# Patient Record
Sex: Male | Born: 1960 | Race: White | Hispanic: No | Marital: Married | Smoking: Former smoker
Health system: Southern US, Community
[De-identification: ages and names within clinical notes are randomized; demographics above are authoritative.]

## PROBLEM LIST (undated history)

## (undated) DIAGNOSIS — E7800 Pure hypercholesterolemia, unspecified: Secondary | ICD-10-CM

## (undated) DIAGNOSIS — G8929 Other chronic pain: Secondary | ICD-10-CM

## (undated) DIAGNOSIS — I77810 Thoracic aortic ectasia: Secondary | ICD-10-CM

## (undated) DIAGNOSIS — M545 Low back pain, unspecified: Secondary | ICD-10-CM

## (undated) DIAGNOSIS — Z8719 Personal history of other diseases of the digestive system: Secondary | ICD-10-CM

## (undated) DIAGNOSIS — F32A Depression, unspecified: Secondary | ICD-10-CM

## (undated) DIAGNOSIS — I2510 Atherosclerotic heart disease of native coronary artery without angina pectoris: Secondary | ICD-10-CM

## (undated) DIAGNOSIS — C329 Malignant neoplasm of larynx, unspecified: Secondary | ICD-10-CM

## (undated) DIAGNOSIS — J189 Pneumonia, unspecified organism: Secondary | ICD-10-CM

## (undated) DIAGNOSIS — I10 Essential (primary) hypertension: Secondary | ICD-10-CM

## (undated) DIAGNOSIS — M1990 Unspecified osteoarthritis, unspecified site: Secondary | ICD-10-CM

## (undated) DIAGNOSIS — R001 Bradycardia, unspecified: Secondary | ICD-10-CM

## (undated) DIAGNOSIS — K9423 Gastrostomy malfunction: Secondary | ICD-10-CM

## (undated) DIAGNOSIS — C3490 Malignant neoplasm of unspecified part of unspecified bronchus or lung: Secondary | ICD-10-CM

## (undated) DIAGNOSIS — K219 Gastro-esophageal reflux disease without esophagitis: Secondary | ICD-10-CM

## (undated) DIAGNOSIS — I6529 Occlusion and stenosis of unspecified carotid artery: Secondary | ICD-10-CM

## (undated) DIAGNOSIS — F419 Anxiety disorder, unspecified: Secondary | ICD-10-CM

## (undated) DIAGNOSIS — Z931 Gastrostomy status: Secondary | ICD-10-CM

## (undated) DIAGNOSIS — D649 Anemia, unspecified: Secondary | ICD-10-CM

## (undated) HISTORY — DX: Thoracic aortic ectasia: I77.810

## (undated) HISTORY — DX: Gastrostomy malfunction: K94.23

## (undated) HISTORY — PX: HEMORRHOID SURGERY: SHX153

## (undated) HISTORY — DX: Gastrostomy status: Z93.1

---

## 2000-05-12 ENCOUNTER — Emergency Department (HOSPITAL_COMMUNITY): Admission: EM | Admit: 2000-05-12 | Discharge: 2000-05-12 | Payer: Self-pay | Admitting: *Deleted

## 2003-04-11 ENCOUNTER — Emergency Department (HOSPITAL_COMMUNITY): Admission: EM | Admit: 2003-04-11 | Discharge: 2003-04-11 | Payer: Self-pay | Admitting: Emergency Medicine

## 2005-10-16 ENCOUNTER — Ambulatory Visit (HOSPITAL_COMMUNITY): Admission: RE | Admit: 2005-10-16 | Discharge: 2005-10-16 | Payer: Self-pay | Admitting: Family Medicine

## 2008-08-18 ENCOUNTER — Ambulatory Visit (HOSPITAL_COMMUNITY): Admission: RE | Admit: 2008-08-18 | Discharge: 2008-08-18 | Payer: Self-pay | Admitting: Family Medicine

## 2009-05-07 ENCOUNTER — Encounter (HOSPITAL_COMMUNITY): Admission: RE | Admit: 2009-05-07 | Discharge: 2009-05-07 | Payer: Self-pay | Admitting: Cardiology

## 2009-07-04 ENCOUNTER — Emergency Department (HOSPITAL_COMMUNITY): Admission: EM | Admit: 2009-07-04 | Discharge: 2009-07-04 | Payer: Self-pay | Admitting: Emergency Medicine

## 2010-01-23 ENCOUNTER — Encounter: Payer: Self-pay | Admitting: Family Medicine

## 2010-02-15 ENCOUNTER — Encounter (HOSPITAL_COMMUNITY): Payer: 59

## 2010-02-15 ENCOUNTER — Other Ambulatory Visit: Payer: Self-pay | Admitting: General Surgery

## 2010-02-15 DIAGNOSIS — Z01812 Encounter for preprocedural laboratory examination: Secondary | ICD-10-CM | POA: Insufficient documentation

## 2010-02-15 DIAGNOSIS — Z01810 Encounter for preprocedural cardiovascular examination: Secondary | ICD-10-CM | POA: Insufficient documentation

## 2010-02-15 LAB — BASIC METABOLIC PANEL
BUN: 19 mg/dL (ref 6–23)
CO2: 27 mEq/L (ref 19–32)
Calcium: 9.5 mg/dL (ref 8.4–10.5)
Chloride: 99 mEq/L (ref 96–112)
Creatinine, Ser: 1.1 mg/dL (ref 0.4–1.5)
GFR calc Af Amer: >60 mL/min (ref >60)
GFR calc non Af Amer: >60 mL/min (ref >60)
Glucose, Bld: 107 mg/dL — ABNORMAL HIGH (ref 70–99)
Potassium: 4.6 mEq/L (ref 3.5–5.1)
Sodium: 134 mEq/L — ABNORMAL LOW (ref 135–145)

## 2010-02-15 LAB — CBC
HCT: 43.1 % (ref 39.0–52.0)
Hemoglobin: 14.6 g/dL (ref 13.0–17.0)
MCH: 31.8 pg (ref 26.0–34.0)
MCHC: 33.9 g/dL (ref 30.0–36.0)
MCV: 93.9 fL (ref 78.0–100.0)
Platelets: 252 10*3/uL (ref 150–400)
RBC: 4.59 MIL/uL (ref 4.22–5.81)
RDW: 12.8 % (ref 11.5–15.5)
WBC: 8.7 10*3/uL (ref 4.0–10.5)

## 2010-02-15 LAB — SURGICAL PCR SCREEN
MRSA, PCR: NEGATIVE
Staphylococcus aureus: NEGATIVE

## 2010-02-16 ENCOUNTER — Other Ambulatory Visit: Payer: Self-pay | Admitting: General Surgery

## 2010-02-16 ENCOUNTER — Ambulatory Visit (HOSPITAL_COMMUNITY)
Admission: RE | Admit: 2010-02-16 | Discharge: 2010-02-16 | Disposition: A | Discharge status: Home / Self Care | Payer: 59 | Source: Ambulatory Visit | Attending: General Surgery | Admitting: General Surgery

## 2010-02-16 DIAGNOSIS — I10 Essential (primary) hypertension: Secondary | ICD-10-CM | POA: Insufficient documentation

## 2010-02-16 DIAGNOSIS — K645 Perianal venous thrombosis: Secondary | ICD-10-CM | POA: Insufficient documentation

## 2010-02-16 DIAGNOSIS — Z79899 Other long term (current) drug therapy: Secondary | ICD-10-CM | POA: Insufficient documentation

## 2010-02-16 DIAGNOSIS — Z01812 Encounter for preprocedural laboratory examination: Secondary | ICD-10-CM | POA: Insufficient documentation

## 2010-02-17 NOTE — Op Note (Signed)
  [name], [name]                 ACCOUNT NO.:  [id number]  MEDICAL RECORD NO.:  [id number]           PATIENT TYPE:  O  LOCATION:  DAYP                          FACILITY:  [facility]  PHYSICIAN:  Dalia Heading, M.D.  DATE OF BIRTH:  20-Mar-1960  DATE OF PROCEDURE:  02/16/2010 DATE OF DISCHARGE:                              OPERATIVE REPORT   Age, 50 years old.  PREOPERATIVE DIAGNOSIS:  Thrombosed hemorrhoid.  POSTOPERATIVE DIAGNOSIS:  Thrombosed hemorrhoid.  PROCEDURE:  Hemorrhoidectomy.  SURGEON:  Dalia Heading, MD  ANESTHESIA:  General.  INDICATIONS:  The patient is a 50 year old white male who presents with a thrombosed internal and external hemorrhoid at the 7 o'clock position. The risks and benefits of the procedure were fully explained to the patient, gave informed consent.  PROCEDURE NOTE:  The patient was placed in lithotomy position after general anesthesia was administered.  The perineum was prepped and draped using the usual sterile technique with Betadine.  Surgical site confirmation was performed.  On anoscopy, a thrombosed internal and external hemorrhoid was noted at the 7 o'clock position.  No other significant hemorrhoidal disease was noted.  Sphincter tone was fine.  The thrombosed hemorrhoid was excised using the LigaSure.  Was sent to the pathology further examination.  A small area of the incision line was reinforced using a 2-0 Vicryl interrupted sutures.  A 0.5 Sensorcaine was instilled into the surrounding wound.  Surgicel and viscous Xylocaine packing was then placed.  All tape and needle counts were correct at the end of the procedure. The patient was awakened and transferred to PACU in stable condition.  COMPLICATIONS:  None.  SPECIMEN:  Thrombosed hemorrhoid.  BLOOD LOSS:  Minimal.     Dalia Heading, M.D.     MAJ/MEDQ  D:  02/16/2010  T:  02/16/2010  Job:  [id number]  cc:   Kirk Ruths, M.D. Fax: [phone]  Electronically  Signed by Franky Macho M.D. on 02/17/2010 12:38:10 PM

## 2010-02-17 NOTE — H&P (Signed)
  [name], [name]                 ACCOUNT NO.:  [id number]  MEDICAL RECORD NO.:  [id number]           PATIENT TYPE:  O  LOCATION:  DAY                           FACILITY:  [facility]  PHYSICIAN:  Dalia Heading, M.D.  DATE OF BIRTH:  [birth year]  DATE OF ADMISSION:  02/15/2010 DATE OF DISCHARGE:  LH                             HISTORY & PHYSICAL   CHIEF COMPLAINT:  Thrombosed hemorrhoid.  HISTORY OF PRESENT ILLNESS:  The patient is a 50 year old white male who is referred for evaluation and treatment of rectal pain.  He started having hemorrhoid pain last week.  He was started on hemorrhoid cream which has not been helpful.  PAST MEDICAL HISTORY:  Hypertension, reflux disease.  PAST SURGICAL HISTORY:  Unremarkable.  CURRENT MEDICATIONS:  Metoprolol, lisinopril, hydrochlorothiazide, ranitidine, simvastatin.  ALLERGIES:  No known drug allergies.  REVIEW OF SYSTEMS:  The patient drinks a 6-pack of alcohol frequently. He does smoke tobacco.  He denies any other cardiopulmonary difficulties or bleeding disorders.  FAMILY MEDICAL HISTORY:  Noncontributory.  PHYSICAL EXAMINATION:  GENERAL:  The patient is a well-developed, well- nourished white male, in no acute distress. HEENT:  No scleral icterus. LUNGS:  Clear to auscultation with equal breath sounds bilaterally. HEART:  Regular rate and rhythm without S3, S4, or murmurs. ABDOMEN:  Unremarkable. RECTAL:  Thrombosed internal hemorrhoid noted along the right lateral aspect of the anus.  Examination is limited secondary to pain.  IMPRESSION:  Thrombosed hemorrhoid.  PLAN:  The patient is scheduled for a hemorrhoidectomy on February 16, 2010.  The risks and benefits of the procedure including bleeding, infection, and recurrence of the hemorrhoidal disease were fully explained to the patient, gave informed consent.     Dalia Heading, M.D.     MAJ/MEDQ  D:  02/15/2010  T:  02/16/2010  Job:  [id number]  cc:   Kirk Ruths, M.D. Fax: [phone]  Electronically Signed by Franky Macho M.D. on 02/17/2010 12:38:07 PM

## 2010-06-23 ENCOUNTER — Other Ambulatory Visit (HOSPITAL_COMMUNITY): Payer: Self-pay | Admitting: Internal Medicine

## 2010-06-23 DIAGNOSIS — M545 Low back pain, unspecified: Secondary | ICD-10-CM

## 2010-06-27 ENCOUNTER — Ambulatory Visit (HOSPITAL_COMMUNITY)
Admission: RE | Admit: 2010-06-27 | Discharge: 2010-06-27 | Disposition: A | Discharge status: Home / Self Care | Payer: 59 | Source: Ambulatory Visit | Attending: Internal Medicine | Admitting: Internal Medicine

## 2010-06-27 DIAGNOSIS — M545 Low back pain, unspecified: Secondary | ICD-10-CM

## 2010-06-27 DIAGNOSIS — M79609 Pain in unspecified limb: Secondary | ICD-10-CM | POA: Insufficient documentation

## 2010-06-27 DIAGNOSIS — M5137 Other intervertebral disc degeneration, lumbosacral region: Secondary | ICD-10-CM | POA: Insufficient documentation

## 2010-06-27 DIAGNOSIS — M5126 Other intervertebral disc displacement, lumbar region: Secondary | ICD-10-CM | POA: Insufficient documentation

## 2010-06-27 DIAGNOSIS — M51379 Other intervertebral disc degeneration, lumbosacral region without mention of lumbar back pain or lower extremity pain: Secondary | ICD-10-CM | POA: Insufficient documentation

## 2010-07-26 ENCOUNTER — Ambulatory Visit (HOSPITAL_COMMUNITY)
Admission: RE | Admit: 2010-07-26 | Discharge: 2010-07-26 | Disposition: A | Discharge status: Home / Self Care | Payer: 59 | Source: Ambulatory Visit | Attending: Family Medicine | Admitting: Family Medicine

## 2010-07-26 ENCOUNTER — Other Ambulatory Visit (HOSPITAL_COMMUNITY): Payer: Self-pay | Admitting: Family Medicine

## 2010-07-26 DIAGNOSIS — S8990XA Unspecified injury of unspecified lower leg, initial encounter: Secondary | ICD-10-CM | POA: Insufficient documentation

## 2010-07-26 DIAGNOSIS — T148XXA Other injury of unspecified body region, initial encounter: Secondary | ICD-10-CM

## 2010-07-26 DIAGNOSIS — M25579 Pain in unspecified ankle and joints of unspecified foot: Secondary | ICD-10-CM | POA: Insufficient documentation

## 2010-07-26 DIAGNOSIS — M7730 Calcaneal spur, unspecified foot: Secondary | ICD-10-CM | POA: Insufficient documentation

## 2010-07-26 DIAGNOSIS — S99919A Unspecified injury of unspecified ankle, initial encounter: Secondary | ICD-10-CM | POA: Insufficient documentation

## 2010-07-26 DIAGNOSIS — S99929A Unspecified injury of unspecified foot, initial encounter: Secondary | ICD-10-CM | POA: Insufficient documentation

## 2010-07-26 DIAGNOSIS — X58XXXA Exposure to other specified factors, initial encounter: Secondary | ICD-10-CM | POA: Insufficient documentation

## 2010-09-01 ENCOUNTER — Other Ambulatory Visit (HOSPITAL_COMMUNITY): Payer: Self-pay | Admitting: Family Medicine

## 2010-09-01 DIAGNOSIS — M7750 Other enthesopathy of unspecified foot: Secondary | ICD-10-CM

## 2010-09-01 DIAGNOSIS — M25579 Pain in unspecified ankle and joints of unspecified foot: Secondary | ICD-10-CM

## 2010-09-06 ENCOUNTER — Ambulatory Visit (HOSPITAL_COMMUNITY): Admission: RE | Admit: 2010-09-06 | Payer: 59 | Source: Ambulatory Visit

## 2012-09-03 ENCOUNTER — Encounter (HOSPITAL_COMMUNITY): Payer: Self-pay | Admitting: *Deleted

## 2012-09-03 ENCOUNTER — Emergency Department (HOSPITAL_COMMUNITY): Payer: BC Managed Care – PPO

## 2012-09-03 ENCOUNTER — Observation Stay (HOSPITAL_COMMUNITY)
Admission: EM | Admit: 2012-09-03 | Discharge: 2012-09-05 | Disposition: A | Discharge status: Home / Self Care | Payer: BC Managed Care – PPO | Attending: Family Medicine | Admitting: Family Medicine

## 2012-09-03 DIAGNOSIS — Z79899 Other long term (current) drug therapy: Secondary | ICD-10-CM | POA: Insufficient documentation

## 2012-09-03 DIAGNOSIS — M545 Low back pain, unspecified: Secondary | ICD-10-CM | POA: Insufficient documentation

## 2012-09-03 DIAGNOSIS — Z8249 Family history of ischemic heart disease and other diseases of the circulatory system: Secondary | ICD-10-CM | POA: Insufficient documentation

## 2012-09-03 DIAGNOSIS — E785 Hyperlipidemia, unspecified: Secondary | ICD-10-CM

## 2012-09-03 DIAGNOSIS — E7800 Pure hypercholesterolemia, unspecified: Secondary | ICD-10-CM | POA: Insufficient documentation

## 2012-09-03 DIAGNOSIS — E871 Hypo-osmolality and hyponatremia: Secondary | ICD-10-CM

## 2012-09-03 DIAGNOSIS — F1010 Alcohol abuse, uncomplicated: Secondary | ICD-10-CM

## 2012-09-03 DIAGNOSIS — R110 Nausea: Secondary | ICD-10-CM | POA: Insufficient documentation

## 2012-09-03 DIAGNOSIS — F411 Generalized anxiety disorder: Secondary | ICD-10-CM | POA: Insufficient documentation

## 2012-09-03 DIAGNOSIS — R079 Chest pain, unspecified: Principal | ICD-10-CM

## 2012-09-03 DIAGNOSIS — Z7982 Long term (current) use of aspirin: Secondary | ICD-10-CM | POA: Insufficient documentation

## 2012-09-03 DIAGNOSIS — R209 Unspecified disturbances of skin sensation: Secondary | ICD-10-CM | POA: Insufficient documentation

## 2012-09-03 DIAGNOSIS — G8929 Other chronic pain: Secondary | ICD-10-CM | POA: Insufficient documentation

## 2012-09-03 DIAGNOSIS — E860 Dehydration: Secondary | ICD-10-CM

## 2012-09-03 DIAGNOSIS — I10 Essential (primary) hypertension: Secondary | ICD-10-CM | POA: Diagnosis present

## 2012-09-03 DIAGNOSIS — R0602 Shortness of breath: Secondary | ICD-10-CM | POA: Insufficient documentation

## 2012-09-03 HISTORY — DX: Essential (primary) hypertension: I10

## 2012-09-03 HISTORY — DX: Anxiety disorder, unspecified: F41.9

## 2012-09-03 HISTORY — DX: Pure hypercholesterolemia, unspecified: E78.00

## 2012-09-03 LAB — CBC
HCT: 38.9 % — ABNORMAL LOW (ref 39.0–52.0)
Hemoglobin: 13.8 g/dL (ref 13.0–17.0)
MCH: 32.9 pg (ref 26.0–34.0)
MCHC: 35.5 g/dL (ref 30.0–36.0)
MCV: 92.6 fL (ref 78.0–100.0)
Platelets: 233 10*3/uL (ref 150–400)
RBC: 4.2 MIL/uL — ABNORMAL LOW (ref 4.22–5.81)
RDW: 13.2 % (ref 11.5–15.5)
WBC: 6.6 10*3/uL (ref 4.0–10.5)

## 2012-09-03 LAB — BASIC METABOLIC PANEL
BUN: 9 mg/dL (ref 6–23)
CO2: 20 mEq/L (ref 19–32)
Calcium: 9.9 mg/dL (ref 8.4–10.5)
Chloride: 92 mEq/L — ABNORMAL LOW (ref 96–112)
Creatinine, Ser: 0.99 mg/dL (ref 0.50–1.35)
GFR calc Af Amer: >90 mL/min (ref >90)
GFR calc non Af Amer: >90 mL/min (ref >90)
Glucose, Bld: 84 mg/dL (ref 70–99)
Potassium: 3.8 mEq/L (ref 3.5–5.1)
Sodium: 129 mEq/L — ABNORMAL LOW (ref 135–145)

## 2012-09-03 LAB — TROPONIN I
Troponin I: <0.3 ng/mL
Troponin I: <0.3 ng/mL (ref <0.30)

## 2012-09-03 MED ORDER — FOLIC ACID 1 MG PO TABS
1.0000 mg | ORAL_TABLET | Freq: Every day | ORAL | Status: DC
  Administered 2012-09-03 – 2012-09-05 (×3): 1 mg via ORAL
  Filled 2012-09-03 (×3): qty 1

## 2012-09-03 MED ORDER — SODIUM CHLORIDE 0.9 % IV SOLN
INTRAVENOUS | Status: AC
  Administered 2012-09-03: 18:00:00 via INTRAVENOUS

## 2012-09-03 MED ORDER — NITROGLYCERIN 0.4 MG SL SUBL
0.4000 mg | SUBLINGUAL_TABLET | SUBLINGUAL | Status: DC | PRN
  Administered 2012-09-03 (×2): 0.4 mg via SUBLINGUAL
  Filled 2012-09-03: qty 25

## 2012-09-03 MED ORDER — SODIUM CHLORIDE 0.9 % IV BOLUS (SEPSIS): 500.0000 mL | Freq: Once | INTRAVENOUS | Status: DC

## 2012-09-03 MED ORDER — ONDANSETRON HCL 4 MG PO TABS: 4.0000 mg | ORAL_TABLET | Freq: Four times a day (QID) | ORAL | Status: DC | PRN

## 2012-09-03 MED ORDER — LISINOPRIL 10 MG PO TABS
20.0000 mg | ORAL_TABLET | Freq: Every day | ORAL | Status: DC
  Administered 2012-09-04: 20 mg via ORAL
  Filled 2012-09-03: qty 2

## 2012-09-03 MED ORDER — ASPIRIN EC 81 MG PO TBEC
81.0000 mg | DELAYED_RELEASE_TABLET | Freq: Every day | ORAL | Status: DC
  Administered 2012-09-04 – 2012-09-05 (×2): 81 mg via ORAL
  Filled 2012-09-03 (×3): qty 1

## 2012-09-03 MED ORDER — SODIUM CHLORIDE 0.9 % IJ SOLN: 3.0000 mL | Freq: Two times a day (BID) | INTRAMUSCULAR | Status: DC

## 2012-09-03 MED ORDER — VITAMIN B-1 100 MG PO TABS
100.0000 mg | ORAL_TABLET | Freq: Every day | ORAL | Status: DC
  Administered 2012-09-03 – 2012-09-05 (×3): 100 mg via ORAL
  Filled 2012-09-03 (×3): qty 1

## 2012-09-03 MED ORDER — ONDANSETRON HCL 4 MG/2ML IJ SOLN: 4.0000 mg | Freq: Four times a day (QID) | INTRAMUSCULAR | Status: DC | PRN

## 2012-09-03 MED ORDER — ADULT MULTIVITAMIN W/MINERALS CH
1.0000 | ORAL_TABLET | Freq: Every day | ORAL | Status: DC
  Administered 2012-09-03 – 2012-09-05 (×3): 1 via ORAL
  Filled 2012-09-03 (×3): qty 1

## 2012-09-03 MED ORDER — SIMVASTATIN 10 MG PO TABS
10.0000 mg | ORAL_TABLET | Freq: Every day | ORAL | Status: DC
  Administered 2012-09-04: 10 mg via ORAL
  Filled 2012-09-03: qty 1

## 2012-09-03 MED ORDER — THIAMINE HCL 100 MG/ML IJ SOLN: 100.0000 mg | Freq: Every day | INTRAMUSCULAR | Status: DC

## 2012-09-03 MED ORDER — ENOXAPARIN SODIUM 40 MG/0.4ML ~~LOC~~ SOLN
40.0000 mg | SUBCUTANEOUS | Status: DC
  Administered 2012-09-03 – 2012-09-04 (×2): 40 mg via SUBCUTANEOUS
  Filled 2012-09-03 (×2): qty 0.4

## 2012-09-03 MED ORDER — LORAZEPAM 1 MG PO TABS
1.0000 mg | ORAL_TABLET | Freq: Four times a day (QID) | ORAL | Status: DC | PRN
  Administered 2012-09-04 (×2): 1 mg via ORAL
  Filled 2012-09-03 (×3): qty 1

## 2012-09-03 MED ORDER — METOPROLOL TARTRATE 50 MG PO TABS
50.0000 mg | ORAL_TABLET | Freq: Two times a day (BID) | ORAL | Status: DC
  Administered 2012-09-03 – 2012-09-05 (×4): 50 mg via ORAL
  Filled 2012-09-03 (×4): qty 1

## 2012-09-03 MED ORDER — NITROGLYCERIN 2 % TD OINT
0.5000 [in_us] | TOPICAL_OINTMENT | Freq: Four times a day (QID) | TRANSDERMAL | Status: DC
  Administered 2012-09-03 – 2012-09-04 (×3): 0.5 [in_us] via TOPICAL
  Filled 2012-09-03 (×4): qty 1

## 2012-09-03 MED ORDER — ACETAMINOPHEN 325 MG PO TABS
ORAL_TABLET | ORAL | Status: AC
  Administered 2012-09-03: 650 mg
  Filled 2012-09-03: qty 2

## 2012-09-03 MED ORDER — MORPHINE SULFATE 2 MG/ML IJ SOLN
1.0000 mg | INTRAMUSCULAR | Status: DC | PRN
  Administered 2012-09-03 – 2012-09-04 (×3): 1 mg via INTRAVENOUS
  Filled 2012-09-03 (×3): qty 1

## 2012-09-03 MED ORDER — ACETAMINOPHEN 325 MG PO TABS
650.0000 mg | ORAL_TABLET | Freq: Four times a day (QID) | ORAL | Status: DC | PRN
  Administered 2012-09-04: 650 mg via ORAL
  Filled 2012-09-03: qty 2

## 2012-09-03 MED ORDER — LORAZEPAM 2 MG/ML IJ SOLN: 1.0000 mg | Freq: Four times a day (QID) | INTRAMUSCULAR | Status: DC | PRN

## 2012-09-03 NOTE — ED Notes (Signed)
Pt reports taking "regular Bayer Aspirin" this morning.

## 2012-09-03 NOTE — ED Provider Notes (Signed)
CSN: [id number]     Arrival date & time 09/03/12  1323 History   This chart was scribed for Jorge Gaskins, MD by Bennett Scrape, ED Scribe. This patient was seen in room [facility]12/[facility]12 and the patient's care was started at 3:25 PM   Chief Complaint  Patient presents with  . Chest Pain    Patient is a 52 y.o. male presenting with chest pain. The history is provided by the patient. No language interpreter was used.  Chest Pain Pain location:  L chest Pain quality: sharp   Pain radiates to:  Does not radiate Pain radiates to the back: no   Duration:  3 hours Timing:  Intermittent Chronicity:  New Associated symptoms: nausea, numbness (in left arm) and shortness of breath   Associated symptoms: no cough, no syncope, not vomiting and no weakness     HPI Comments: [name] is a 52 y.o. male who presents to the Emergency Department complaining of intermittent left-sided CP described as sharp that started around 12:00 PM today. He states that he was at work at the time in the heat when the pain started. He denies having pain with ambulation or deep breathing and denies radiation to his back. He rates his pain a 5 out of 10 currently and reports associated nausea, numbness in the left arm and SOB. He denies taking any medications to improve the pain but admits that he takes 324 mg ASA daily. He reports one prior episode 2 weeks ago that resolved on its own. He states that the symptoms felt like his h/o GERD and he denies being evaluated for the pain at the time. He denies having a h/o DVT, PE, MI or CVA but states that his father has a h/o cardiac disease with stent placement. He denies syncope, cough and leg swelling as associated symptoms.    Past Medical History  Diagnosis Date  . Hypertension   . Anxiety   . Chronic back pain    Past Surgical History  Procedure Laterality Date  . Hemorrhoid surgery     No family history on file. History  Substance Use Topics  . Smoking status:  Never Smoker   . Smokeless tobacco: Not on file  . Alcohol Use: Yes     Comment: daily    Review of Systems  Respiratory: Positive for shortness of breath. Negative for cough.   Cardiovascular: Positive for chest pain. Negative for leg swelling and syncope.  Gastrointestinal: Positive for nausea. Negative for vomiting.  Neurological: Positive for numbness (in left arm). Negative for weakness.  All other systems reviewed and are negative.    Allergies  Review of patient's allergies indicates no known allergies.  Home Medications  No current outpatient prescriptions on file.  Triage Vitals: BP 171/96  Pulse 66  Temp(Src) 97.6 F (36.4 C) (Oral)  Resp 16  Ht 6' (1.829 m)  Wt 195 lb (88.451 kg)  BMI 26.44 kg/m2  SpO2 98%  Physical Exam  Nursing note and vitals reviewed.  CONSTITUTIONAL: Well developed/well nourished HEAD: Normocephalic/atraumatic EYES: EOMI/PERRL ENMT: Mucous membranes moist NECK: supple no meningeal signs SPINE:entire spine nontender CV: S1/S2 noted, no murmurs/rubs/gallops noted LUNGS: scattered wheezes, no apparent distress ABDOMEN: soft, nontender, no rebound or guarding GU:no cva tenderness NEURO: Pt is awake/alert, moves all extremitiesx4 EXTREMITIES: pulses normal, full ROM, no calf tenderness or edema  SKIN: warm, color normal PSYCH: no abnormalities of mood noted  ED Course  Procedures   Medications  nitroGLYCERIN (NITROSTAT) SL  tablet 0.4 mg (0.4 mg Sublingual Given 09/03/12 1548)    DIAGNOSTIC STUDIES: Oxygen Saturation is 98% on room air, normal by my interpretation.    COORDINATION OF CARE: 3:28 PM-Informed pt that his EKG is normal. Discussed treatment plan which includes NTG, CXR, CBC panel, BMP and troponin with pt at bedside and pt agreed to plan.   4:18 PM Will admit for chest pain r/o MI given history/exam D/w [facility], will admit Pt stable at this time, reports his CP is improving  Labs Review Labs Reviewed  CBC -  Abnormal; Notable for the following:    RBC 4.20 (*)    HCT 38.9 (*)    All other components within normal limits  BASIC METABOLIC PANEL - Abnormal; Notable for the following:    Sodium 129 (*)    Chloride 92 (*)    All other components within normal limits  TROPONIN I   Imaging Review Dg Chest 2 View  09/03/2012   *RADIOLOGY REPORT*  Clinical Data: Chest pain  CHEST - 2 VIEW  Comparison:  October 16, 2005  Findings: There is underlying emphysematous change.  There is no edema or consolidation.  The heart size is normal.  The pulmonary vascularity reflects underlying emphysema.  No adenopathy.  There is upper thoracic levoscoliosis.  There is calcification in the right carotid artery.  IMPRESSION: Underlying emphysema.  No edema or consolidation.  Calcification in the right carotid artery.   Original Report Authenticated By: Bretta Bang, M.D.    MDM  No diagnosis found. ,Nursing notes including past medical history and social history reviewed and considered in documentation  xrays reviewed and considered Labs/vital reviewed and considered       Date: 09/03/2012  Rate: 73  Rhythm: normal sinus rhythm  QRS Axis: normal  Intervals: normal  ST/T Wave abnormalities: normal  Conduction Disutrbances:none     I personally performed the services described in this documentation, which was scribed in my presence. The recorded information has been reviewed and is accurate.      Jorge Gaskins, MD 09/03/12 [phone]

## 2012-09-03 NOTE — H&P (Signed)
[facility] History and Physical  [name] ZOX:[id number] DOB: 1960/08/12 DOA: 09/03/2012  Referring physician: Dr. Bebe Shaggy PCP: Kirk Ruths, MD  Specialists: Cardiology  Chief Complaint: Chest pain  HPI: [name] is a 52 y.o. male has a past medical history significant for hypertension, hyperlipidemia, alcohol abuse, presents with chief complaint of chest pain. This chest pain started around noon today while he was at work at the time in the heat. He describes his chest pain as sharp, left-sided, associated with left arm numbness. His chest pain does not irradiate. His chest pain is not worse with a deep breath. His chest pain is associated with nausea, diaphoresis, and mild shortness of breath. He had a similar episode about couple weeks ago and at that time his chest pain was there for the entire day and the eventually subsided. He is a family history of heart attacks, his father had a heart stent placed at the age of 4. At baseline, he is able to work a full day without any chest pain or shortness of breath. In the emergency room, his chest pain has significantly improved with sublingual nitroglycerin. He denies any abdominal pain, vomiting or diarrhea, he denies any lightheadedness or dizziness. He denies any weight gain in the last 2 weeks.   Review of Systems: As per history of present illness, otherwise negative  Past Medical History  Diagnosis Date  . Hypertension   . Anxiety   . Chronic back pain   . Hypercholesteremia    Past Surgical History  Procedure Laterality Date  . Hemorrhoid surgery     Social History:  reports that he has never smoked. He does not have any smokeless tobacco history on file. He reports that  drinks alcohol. He reports that he does not use illicit drugs.  No Known Allergies  History reviewed. No pertinent family history.   Prior to Admission medications   Medication Sig Start Date End Date Taking? Authorizing Provider  aspirin  EC 81 MG tablet Take 81 mg by mouth every morning.   Yes Historical Provider, MD  HYDROcodone-acetaminophen (NORCO) 10-325 MG per tablet Take 1 tablet by mouth every 6 (six) hours as needed for pain.   Yes Historical Provider, MD  lisinopril (PRINIVIL,ZESTRIL) 20 MG tablet Take 20 mg by mouth 2 (two) times daily.   Yes Historical Provider, MD  metoprolol (LOPRESSOR) 50 MG tablet Take 50 mg by mouth 2 (two) times daily.   Yes Historical Provider, MD  pravastatin (PRAVACHOL) 20 MG tablet Take 20 mg by mouth daily.   Yes Historical Provider, MD   Physical Exam: Filed Vitals:   09/03/12 1333 09/03/12 1519  BP: 142/91 171/96  Pulse: 80 66  Temp: 97.6 F (36.4 C)   TempSrc: Oral   Resp: 16 16  Height: 6' (1.829 m)   Weight: 88.451 kg (195 lb)   SpO2: 99% 98%     General:  No apparent distress  Eyes: PERRL, EOMI, no scleral icterus  ENT: moist oropharynx  Neck: supple, no JVD  Cardiovascular: regular rate without MRG; 2+ peripheral pulses  Respiratory: CTA biL, good air movement without wheezing, rhonchi or crackled  Abdomen: soft, non tender to palpation, positive bowel sounds, no guarding, no rebound  Skin: no rashes  Musculoskeletal: no peripheral edema  Psychiatric: normal mood and affect  Neurologic: non focal  Labs on Admission:  Basic Metabolic Panel:  Recent Labs Lab 09/03/12 1400  NA 129*  K 3.8  CL 92*  CO2 20  GLUCOSE 84  BUN 9  CREATININE 0.99  CALCIUM 9.9   CBC:  Recent Labs Lab 09/03/12 1400  WBC 6.6  HGB 13.8  HCT 38.9*  MCV 92.6  PLT 233   Cardiac Enzymes:  Recent Labs Lab 09/03/12 1400  TROPONINI <0.30   Radiological Exams on Admission: Dg Chest 2 View  09/03/2012   *RADIOLOGY REPORT*  Clinical Data: Chest pain  CHEST - 2 VIEW  Comparison:  October 16, 2005  Findings: There is underlying emphysematous change.  There is no edema or consolidation.  The heart size is normal.  The pulmonary vascularity reflects underlying emphysema.   No adenopathy.  There is upper thoracic levoscoliosis.  There is calcification in the right carotid artery.  IMPRESSION: Underlying emphysema.  No edema or consolidation.  Calcification in the right carotid artery.   Original Report Authenticated By: Bretta Bang, M.D.   EKG: Independently reviewed. Sinus rhythm  Assessment/Plan Principal Problem:   Chest pain Active Problems:   Hypertension   Alcohol abuse, daily use   Hyperlipidemia   Hyponatremia  Chest pain - typical characteristics given onset when he was working in the heat and with left arm numbness and relieved by NTG. Initial troponin negative, will check x 3. Have kindly asked cardiology to evaluate, may need a stress test. Has HTN, HLD and strong family history.  HTN - continue home medications Alcohol abuse - drinks a sixpack per day. Place on CIWA. HLD - continue statin.  Hyponatremia - likely hypovolemic, hydration.  Code Status: Full   Family Communication: family in the room  Disposition Plan: observation  Time spent: 86  Herminio Kniskern M. Elvera Lennox, MD [facility] Pager [phone]  If 7PM-7AM, please contact night-coverage [email or web address] Password [facility] 09/03/2012, 4:41 PM

## 2012-09-03 NOTE — Progress Notes (Signed)
Pt has a reaction to salt substitutes.  States blood pressure rises after consumption.

## 2012-09-03 NOTE — ED Notes (Signed)
Sudden onset left sided cp with sob and nausea that started while driving today.  Also reports left hand numbness.  Has been under a lot of stress recently.

## 2012-09-03 NOTE — ED Notes (Signed)
Patient placed on cardiac monitor with continuous pulse oximetry.

## 2012-09-03 NOTE — ED Notes (Signed)
When doing SI screening, pt reports, "Me and my wife have been at it lately and I had thoughts of suicide earlier today, but they went away."  Pt denies plan, denies SI at this time.

## 2012-09-03 NOTE — ED Notes (Signed)
Patient states chest pain decreased from 5 to 4 after first sl nitro.

## 2012-09-04 DIAGNOSIS — R079 Chest pain, unspecified: Secondary | ICD-10-CM

## 2012-09-04 DIAGNOSIS — E785 Hyperlipidemia, unspecified: Secondary | ICD-10-CM

## 2012-09-04 DIAGNOSIS — E871 Hypo-osmolality and hyponatremia: Secondary | ICD-10-CM

## 2012-09-04 DIAGNOSIS — R072 Precordial pain: Secondary | ICD-10-CM

## 2012-09-04 DIAGNOSIS — F1010 Alcohol abuse, uncomplicated: Secondary | ICD-10-CM

## 2012-09-04 DIAGNOSIS — I10 Essential (primary) hypertension: Secondary | ICD-10-CM

## 2012-09-04 LAB — LIPID PANEL
Cholesterol: 166 mg/dL (ref 0–200)
HDL: 71 mg/dL (ref >39)
LDL Cholesterol: 77 mg/dL (ref 0–99)
Total CHOL/HDL Ratio: 2.3 RATIO
Triglycerides: 89 mg/dL (ref <150)
VLDL: 18 mg/dL (ref 0–40)

## 2012-09-04 LAB — BASIC METABOLIC PANEL
BUN: 11 mg/dL (ref 6–23)
CO2: 24 mEq/L (ref 19–32)
Calcium: 9.6 mg/dL (ref 8.4–10.5)
Chloride: 100 mEq/L (ref 96–112)
Creatinine, Ser: 0.99 mg/dL (ref 0.50–1.35)
GFR calc Af Amer: >90 mL/min (ref >90)
GFR calc non Af Amer: >90 mL/min (ref >90)
Glucose, Bld: 88 mg/dL (ref 70–99)
Potassium: 4.1 mEq/L (ref 3.5–5.1)
Sodium: 137 mEq/L (ref 135–145)

## 2012-09-04 LAB — TROPONIN I
Troponin I: <0.3 ng/mL (ref <0.30)
Troponin I: <0.3 ng/mL (ref <0.30)

## 2012-09-04 LAB — CBC
HCT: 39 % (ref 39.0–52.0)
Hemoglobin: 13.4 g/dL (ref 13.0–17.0)
MCH: 32.6 pg (ref 26.0–34.0)
MCHC: 34.4 g/dL (ref 30.0–36.0)
MCV: 94.9 fL (ref 78.0–100.0)
Platelets: 222 10*3/uL (ref 150–400)
RBC: 4.11 MIL/uL — ABNORMAL LOW (ref 4.22–5.81)
RDW: 13.4 % (ref 11.5–15.5)
WBC: 5.9 10*3/uL (ref 4.0–10.5)

## 2012-09-04 MED ORDER — LISINOPRIL 10 MG PO TABS
40.0000 mg | ORAL_TABLET | Freq: Every day | ORAL | Status: DC
  Administered 2012-09-05: 40 mg via ORAL
  Filled 2012-09-04: qty 4

## 2012-09-04 NOTE — Consult Note (Signed)
CARDIOLOGY CONSULT NOTE  Patient ID: [name] MRN: [id number] DOB/AGE: 51-30-1962 51 y.o.  Admit date: 09/03/2012 Referring Physician: P[facility] Primary PhysicianMCGOUGH,WILLIAM M, MD Primary Cardiologist: [facility] Reason for Consultation: Chest Pain Principal Problem:   Chest pain Active Problems:   Hypertension   Alcohol abuse, daily use   Hyperlipidemia   Hyponatremia  HPI: [name] 52 year old patient formerly of [facility] with no known CAD, with hx of hypertension, hyperlipidemia, chronic back pain,  and polysubstance abuse with beer ( 6pk of 24 oz beer or more daily) and marijuana.  He was admitted with recurrent chest pain described as sharp on the right side of his chest with numbness and tingling down the left arm and pain between his shoulder blades while leaving work on Tuesday afternoon. 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  States it lasted 6 hours and was constant. He had associated dyspnea, diaphoresis. Stopped to see his wife at work who saw that he was pale and diaphoretic. Brought hin to the ER.    On arrival to ER he was found to be hypertensive, 171/96, hyponatremic 129, CL 92. Troponin<0.30. EKG NSR without evidence of ACS.Marland KitchenCXR with underlying emphysema but no CHF. He was treated with NTG and ASA.   Pain was relieved with NTG and IV fluids. His only complaint is chronic low back pain, and has not had pain medications since admission. He is normally on Norco.   He had a stress test with [facility] in 2011 with ST depression, in the inferior and lateral leads, but no further cardiac testing was completed.He did not follow up with them. Wife at bedside states that he has been retaining fluid and sometimes uses her diuretic for relief. He states he has been having recurrent heartburn which is relieved with anti-acid. No increase in frequency or duration of these symptoms over the last several months.   Currently resting comfortably without  recurrent chest pain, but has become restless and complaining of headache. He is on DT protocol with last beer 3 days ago. Echo has been ordered.     Review of systems complete and found to be negative unless listed above   Past Medical History  Diagnosis Date  . Hypertension   . Anxiety   . Chronic back pain   . Hypercholesteremia   . Anginal pain     History reviewed. No pertinent family history.  History   Social History  . Marital Status: Married    Spouse Name: N/A    Number of Children: N/A  . Years of Education: N/A   Occupational History  . Not on file.   Social History Main Topics  . Smoking status: Never Smoker   . Smokeless tobacco: Not on file  . Alcohol Use: Yes     Comment: daily (6 pack per day per patient)  . Drug Use: No  . Sexual Activity: Not on file   Other Topics Concern  . Not on file   Social History Narrative  . No narrative on file    Past Surgical History  Procedure Laterality Date  . Hemorrhoid surgery       Prescriptions prior to admission  Medication Sig Dispense Refill  . aspirin EC 81 MG tablet Take 81 mg by mouth every morning.      Marland Kitchen HYDROcodone-acetaminophen (NORCO) 10-325 MG per tablet Take 1 tablet by mouth every 6 (six) hours as needed for pain.      Marland Kitchen lisinopril (  PRINIVIL,ZESTRIL) 20 MG tablet Take 20 mg by mouth 2 (two) times daily.      . metoprolol (LOPRESSOR) 50 MG tablet Take 50 mg by mouth 2 (two) times daily.      . pravastatin (PRAVACHOL) 20 MG tablet Take 20 mg by mouth daily.        Physical Exam: Blood pressure 152/105, pulse 58, temperature 97.7 F (36.5 C), temperature source Oral, resp. rate 18, height 6' (1.829 [name]), weight 194 lb 7.1 oz (88.2 kg), SpO2 98.00%.   General: Well developed, well nourished, in no acute distress Head: Eyes PERRLA, No xanthomas.   Normal cephalic and atramatic  Lungs: Clear bilaterally to auscultation, no wheezes or rhonchi Heart: HRRR S1 S2, without MRG.  Pulses are 2+ &  equal.            Soft carotid bruit on the right, not on the left.Marland Kitchen No JVD.  No abdominal bruits. No femoral bruits. Abdomen: Bowel sounds are positive, abdomen soft and non-tender without masses or  Hernia's noted. Msk:  Back normal, . Normal strength and tone for age. Extremities: No clubbing, cyanosis or edema.  DP +1 Neuro: Alert and oriented X 3. Psych:  Good affect, responds appropriately, restless.  Labs:   Lab Results  Component Value Date   WBC 5.9 09/04/2012   HGB 13.4 09/04/2012   HCT 39.0 09/04/2012   MCV 94.9 09/04/2012   PLT 222 09/04/2012    Recent Labs Lab 09/04/12 0524  NA 137  K 4.1  CL 100  CO2 24  BUN 11  CREATININE 0.99  CALCIUM 9.6  GLUCOSE 88   Lab Results  Component Value Date   TROPONINI <0.30 09/04/2012    Stress Test 05/07/2009  FINDINGS:  Baseline EKG shows normal sinus rhythm at a rate of 57 beat per minute, T-wave inversions in the septal leads, and otherwise no significant changes.  The patient exercised for 7 minutes and 3 seconds. He reached a maximum heart rate of 175 beat per minute, exceeding 100% of his maximum predicted heart rate.  He exercised into stage III of Bruce protocol and achieved 10.1 mets.  During stage III, the patient developed up to 1.5 horizontal ST depression in the lateral and inferior leads.  There was occasional PVCs seen during exercise.  The patient had a hypertensive response to exercise with maximum systolic blood pressure of 202 mmHg.  The patient had no symptoms.  Exercise was stopped due to heart rate achieved.  FINDINGS: 1. Adequate treadmill stress test based on heart rate achieved, good     exercise tolerance. 2. EKG, positive for ischemia. 3. Hypertensive blood pressure response to exercise. 4. No stress-induced rhythm abnormalities.     Radiology: Dg Chest 2 View  09/03/2012   *RADIOLOGY REPORT*  Clinical Data: Chest pain  CHEST - 2 VIEW  Comparison:  October 16, 2005  Findings: There is underlying  emphysematous change.  There is no edema or consolidation.  The heart size is normal.  The pulmonary vascularity reflects underlying emphysema.  No adenopathy.  There is upper thoracic levoscoliosis.  There is calcification in the right carotid artery.  IMPRESSION: Underlying emphysema.  No edema or consolidation.  Calcification in the right carotid artery.   Original Report Authenticated By: Bretta Bang, [name].D.   EKG:NSR rate of 73 bpm.  ASSESSMENT AND PLAN:   1. Chest Pain:  Typical and atypical features lasting 6 hours after working in hot atttic, radiation to shoulder blades with left arm  numbness. Multiple CVRF to include hypertension, hypercholesterolemia, ETOH, and unfiltered smoking. Echo is ordered.Troponin is negative. If echo is abnormal, may consider stress myoview as IP vs cath. Otherwise can follow up as OP for stress test. Continue BB and ACE. NTG paste has been d/c'd.  2. Hypertension: Currently not well controlled. Remains hypertensive. May need increase in lisiiopril dose or add another agent if not well controlled.  Would not favor diuretic with hyponatremia presently.  3. Hypercholesterolemia: Studies WNL. Home medications include pravastatin. Continue statin here in the hospital.   4. ETOH abuse: Echo to evaluate for ETOH cardiomyopathy. Cessation strongly recommended.   Signed: Bettey Mare. Lyman Bishop NP [facility] 09/04/2012, 11:04 AM Co-Sign MD

## 2012-09-04 NOTE — Progress Notes (Signed)
[facility] PROGRESS NOTE  [name] ZOX:[id number] DOB: Dec 30, 1960 DOA: 09/03/2012 PCP: Kirk Ruths, MD  Assessment/Plan: Chest pain - No further episodes. 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 Relieved with Morphine and NTG given in ED. Repeat EKG yields SB without signs ischemia. Troponin negative x4,  Await cardiology consult. May need a stress test. Has HTN, HLD and strong family history. Continue asa and BB. monitor  HTN - only fair control. Pt on lisinopril and metoprolol. Will continue to monitor and make medication adjustments as indicated.    Alcohol abuse - drinks over a sixpack per day. continue CIWA. No signs withdrawal.   HLD - continue statin. Will check lipid panel  Hyponatremia - likely hypovolemic. Resolved with hydration   Code Status: full Family Communication: father at bedside Disposition Plan: home when ready hopefully this afternoon or in am   Consultants:  cardiology  Procedures:  none  Antibiotics: none HPI/Subjective: Watching TV. Denies any further CP. Denies any discomfort.   Objective: Filed Vitals:   09/04/12 0550  BP: 152/105  Pulse: 58  Temp: 97.7 F (36.5 C)  Resp: 18   No intake or output data in the 24 hours ending 09/04/12 1038 Filed Weights   09/03/12 1333 09/03/12 1741  Weight: 88.451 kg (195 lb) 88.2 kg (194 lb 7.1 oz)    Exam:   General:  Well nourished NAD  Cardiovascular: RRR no gallup no rub no LE edema  Respiratory: normal effort BS clear bilaterally no wheeze no rhonchi  Abdomen: flat soft +BS non-tender to palpation  Musculoskeletal: MAE no clubbing no cyanosis   Data Reviewed: Basic Metabolic Panel:  Recent Labs Lab 09/03/12 1400 09/04/12 0524  NA 129* 137  K 3.8 4.1  CL 92* 100  CO2 20 24  GLUCOSE 84 88  BUN 9 11  CREATININE 0.99 0.99  CALCIUM 9.9 9.6   Liver Function Tests: No results found for this basename: AST, ALT, ALKPHOS, BILITOT, PROT, ALBUMIN,  in  the last 168 hours No results found for this basename: LIPASE, AMYLASE,  in the last 168 hours No results found for this basename: AMMONIA,  in the last 168 hours CBC:  Recent Labs Lab 09/03/12 1400 09/04/12 0524  WBC 6.6 5.9  HGB 13.8 13.4  HCT 38.9* 39.0  MCV 92.6 94.9  PLT 233 222   Cardiac Enzymes:  Recent Labs Lab 09/03/12 1400 09/03/12 1924 09/04/12 0044 09/04/12 0524  TROPONINI <0.30 <0.30 <0.30 <0.30   BNP (last 3 results) No results found for this basename: PROBNP,  in the last 8760 hours CBG: No results found for this basename: GLUCAP,  in the last 168 hours  No results found for this or any previous visit (from the past 240 hour(s)).   Studies: Dg Chest 2 View  09/03/2012   *RADIOLOGY REPORT*  Clinical Data: Chest pain  CHEST - 2 VIEW  Comparison:  October 16, 2005  Findings: There is underlying emphysematous change.  There is no edema or consolidation.  The heart size is normal.  The pulmonary vascularity reflects underlying emphysema.  No adenopathy.  There is upper thoracic levoscoliosis.  There is calcification in the right carotid artery.  IMPRESSION: Underlying emphysema.  No edema or consolidation.  Calcification in the right carotid artery.   Original Report Authenticated By: Bretta Bang, M.D.    Scheduled Meds: . aspirin EC  81 mg Oral Daily  . enoxaparin (LOVENOX) injection  40 mg Subcutaneous Q24H  . folic acid  1 mg Oral Daily  . lisinopril  20 mg Oral Daily  . metoprolol  50 mg Oral BID  . multivitamin with minerals  1 tablet Oral Daily  . nitroGLYCERIN  0.5 inch Topical Q6H  . simvastatin  10 mg Oral q1800  . sodium chloride  500 mL Intravenous Once  . sodium chloride  3 mL Intravenous Q12H  . thiamine  100 mg Oral Daily   Or  . thiamine  100 mg Intravenous Daily   Continuous Infusions: . sodium chloride 75 mL/hr at 09/03/12 1819    Principal Problem:   Chest pain Active Problems:   Hypertension   Alcohol abuse, daily use    Hyperlipidemia   Hyponatremia    Time spent: 30 minutes    [facility] M  [facility] Pager [phone]. If 7PM-7AM, please contact night-coverage at [email or web address], password [facility] 09/04/2012, 10:38 AM  LOS: 1 day

## 2012-09-04 NOTE — Progress Notes (Signed)
Patient seen, independently examined and chart reviewed. I agree with exam, assessment and plan discussed with Jorge Smothers, NP.  Patient feels well. He has no chest pain or shortness of breath. No issues.  Awaiting results of 2-D echocardiogram to determine further evaluation.  Remains hypertensive. Adjust antihypertensives.  Brendia Sacks, MD [facility] [phone]

## 2012-09-04 NOTE — Progress Notes (Signed)
*  PRELIMINARY RESULTS* Echocardiogram 2D Echocardiogram has been performed.  Conrad Maplewood 09/04/2012, 3:24 PM

## 2012-09-04 NOTE — Progress Notes (Signed)
UR chart review completed.  

## 2012-09-04 NOTE — Consult Note (Signed)
The patient was seen and examined, and I agree with the assessment and plan as documented above. Pt is now free of chest pain. HTN not well controlled, and I've increased his Lisinopril back to his home dose of 40 mg. He may require an additional agent. I reviewed his echocardiogram which showed normal LV systolic function. He has ruled out for an ACS with serial troponins. I would recommend stress testing, which can be done as an outpatient.

## 2012-09-05 MED ORDER — THIAMINE HCL 100 MG PO TABS: 100.0000 mg | ORAL_TABLET | Freq: Every day | ORAL | Status: DC

## 2012-09-05 MED ORDER — NITROGLYCERIN 0.4 MG SL SUBL: 0.4000 mg | SUBLINGUAL_TABLET | SUBLINGUAL | Status: DC | PRN

## 2012-09-05 MED ORDER — ADULT MULTIVITAMIN W/MINERALS CH: 1.0000 | ORAL_TABLET | Freq: Every day | ORAL | Status: DC

## 2012-09-05 MED ORDER — FOLIC ACID 1 MG PO TABS: 1.0000 mg | ORAL_TABLET | Freq: Every day | ORAL | Status: DC

## 2012-09-05 NOTE — Discharge Summary (Signed)
   Patient seen, independently examined and chart reviewed. I agree with exam, assessment and plan discussed with Algis Downs, PA-C.  Patient stable for discharge. Walking in hall independently. No signs or symptoms of alcohol withdrawal. We discussed his drinking which is significant. We discussed negative impacts including the possibility of developing cirrhosis or heart failure. He has little insight into the severity of his alcohol intake and does not appear to have any desire to stop. Cessation was recommended.  He has been pain-free without recurrence of cardiac symptomatology or shortness of breath. 2-D echocardiogram was reassuring. Hypertension has been somewhat labile but overall stable. Stable for discharge home. Outpatient nuclear stress testing has been planned. I instructed the patient to return for recurrent chest pain.  Brendia Sacks, MD [facility] [phone]

## 2012-09-05 NOTE — Clinical Social Work Psychosocial (Signed)
Clinical Social Work Department BRIEF PSYCHOSOCIAL ASSESSMENT 09/05/2012  Patient:  [name], [name]     Account Number:  [id number]     Admit date:  09/03/2012  Clinical Social Worker:  Santa Genera, CLINICAL SOCIAL WORKER  Date/Time:  09/05/2012 10:00 AM  Referred by:  Physician  Date Referred:  09/05/2012 Referred for  Substance Abuse   Other Referral:   Interview type:  Patient Other interview type:   Wife present in room w patient consent    PSYCHOSOCIAL DATA Living Status:  FAMILY Admitted from facility:   Level of care:   Primary support name:  Maguire Sime Primary support relationship to patient:  SPOUSE Degree of support available:   Wife is supportive, but may withdraw support if patient continues substance abuse.    CURRENT CONCERNS Current Concerns  Substance Abuse   Other Concerns:    SOCIAL WORK ASSESSMENT / PLAN CSW met w patient at bedside, patient alert and oriented x4.  Patient had some difficulty w calculating consumption levels and numerical scales.  Wife present in room and participating w patient consent.  Patient has long history of substance use, beginning at age 59.  States that he began heavy drinking approx age 85, currently admits to drinking daily after coming home from work before dinner.  Drinks approx 144 oz of beer, but found it difficult to estimate consumption figuring difference between 12 and 24 oz cans of beer.  Wife states that patient underestimates his consumption, feels that patient actually consumes at least 144 ounces/day.  Patient also admits to intermittent use of cannabis, states that he smokes on way home from work.  Administered SBIRT, patient scored 11 on consumption, 2 on dependence and 6 on alcohol related problem score, giving a total score of 19 indicating high risk drinking.  CSW concerned that patient underestimates his dependence, so score may actually be higher than reported.    Wife states that their marriage has been  negatively impacted by patient's drinking and that she is making plans to leave the relationship after 7 years.  Couple has no children.  Husband cannot name costs to his drinking, other than negative comments or requests that he stop made by his wife.  He does not feel that his drinking poses a problem to him and seems to have little motivation to cut down/stop.  Patient does say that he has cut down some in the past week after his wife's statement that she will leave unless he quits drinking.    Encouraged both patient and wife to access 12 step recovery programs, gave patient information on health related consequences of alcohol use.  Patient says that he has been to 3 AA meetings 20 years ago - was sent by  his boss and told he would lose his job if he did not attend.    At this time, patient feels that he can cut down/stop on his own.  CSW cautioned patient on potential medical complications of unsupervised detox.  Patient accepted information on AA meetings.  Did not want referrals to any residential treatment programs as he wants to return to work as soon as possible.  Wife given referral to [employer] and strongly encouraged to attend.  CSW signing off at this time as no further SW intervention needed.   Assessment/plan status:  Other - See comment Other assessment/ plan:   Information/referral to community resources:   Rethinking Drinking  AA meeting list  Readiness Ruler  SBIRT  [facility] meeting list for  wife    PATIENT'S/FAMILY'S RESPONSE TO PLAN OF CARE: Wife states she will attend [facility], unclear whether patient feels his drinking is enough of a problem to motivate him to seek outside help of any kind.     Santa Genera, LCSW Clinical Social Worker [phone])

## 2012-09-05 NOTE — Discharge Summary (Signed)
Physician Discharge Summary  [name]:[id number] DOB: [birth year] DOA: 09/03/2012  PCP: Kirk Ruths, MD  Admit date: 09/03/2012 Discharge date: 09/05/2012  Time spent: 50 minutes  Recommendations for Outpatient Follow-up:  Cardiology NM Stress test on 9/12 Follow up with PCP regarding Blood Pressure, and Alcohol Cessation  Discharge Diagnoses:  Principal Problem:   Chest pain Active Problems:   Hypertension   Alcohol abuse, daily use   Hyperlipidemia   Hyponatremia   Discharge Condition: stable, with out chest pain  Diet recommendation: Heart Healthy  Filed Weights   09/03/12 1333 09/03/12 1741  Weight: 88.451 kg (195 lb) 88.2 kg (194 lb 7.1 oz)    History of present illness:  [name] is a 52 y.o. male has a past medical history significant for hypertension, hyperlipidemia, alcohol abuse, presents with chief complaint of chest pain. This chest pain started around noon today while he was at work in the heat. He describes his chest pain as sharp, left-sided, associated with left arm numbness. His chest pain is associated with nausea, diaphoresis, and mild shortness of breath. He had a similar episode about couple weeks ago and at that time his chest pain was there for the entire day and the eventually subsided. He is a family history of heart attacks, his father had a heart stent placed at the age of 35. At baseline, he is able to work a full day without any chest pain or shortness of breath. In the emergency room, his chest pain has significantly improved with sublingual nitroglycerin.  Hospital Course:   Chest pain  Appreciate [facility] Consult  No further episodes after admission  Relieved with Morphine and NTG given in ED.   Troponin negative x4  2d echo for alcoholic cardiomyopathy completed.  Results below.  Will have outpatient cardiac stress test 9/12  Continue Aspirin and BB, add SL nitro PRN at discharge.  HTN, HLD and strong family  history.   HTN  Not well controlled in the hospital (early alcohol withdraw?).   Continue lisinopril and metoprolol.   Anticipate that BP is better controlled at home.  Considered clonidine patch but decided against for fear of rebound hypertension with non-compliance.    Stable for further management as an outpatient.   Alcohol abuse  Per wife started when he was 15.  Drinks over a six pack of 24 oz bottles of miller lite per day.   Monitored using CIWA Protocol in hospital.  Educated patient and his wife re: the effect of alcohol on the heart and the need to stop drinking.  Patient would not consider inpatient rehab as he feels he can not take off from work.  Social Work consulted for resources and guidance  Added folate, thiamine, and multi vitamin to outpatient med list.  HLD   continue statin.  Hyponatremia   Secondary to beer consumption and poor nutrition  Resolved with hydration    Procedures:  2D echocardiogram Study Conclusions  Left ventricle: Systolic function was normal. The estimated ejection fraction was in the range of 60% to 65%. Wall motion was normal; there were no regional wall motion abnormalities. There was an increased relative contribution of atrial contraction to ventricular filling. Doppler parameters are consistent with abnormal left ventricular relaxation (grade 1 diastolic dysfunction).   Consultations:  cardiology  Discharge Exam: Filed Vitals:   09/05/12 0852  BP: 168/101  Pulse: 64  Temp:   Resp:    General:  WD, WN, Flushed Face, Quiet, Appears Angry  Cardiovascular:  RRR, no m/r/g, no jvd, no LLE Resp:  CTA, no w/c/r, no accessory muscle use. Abdomen:  Slightly protuberant, nt, soft, positive bowel sounds, no masses Extremities:  5/5 strength in each.  No edema.  Discharge Instructions      Discharge Orders   Future Appointments Provider Department Dept Phone   09/13/2012 11:00 AM [facility] [phone]   09/24/2012 1:20 PM Jodelle Gross, NP [facility] [phone]   Future Orders Complete By Expires   Diet - low sodium heart healthy  As directed    Increase activity slowly  As directed        Medication List         aspirin EC 81 MG tablet  Take 81 mg by mouth every morning.     folic acid 1 MG tablet  Commonly known as:  FOLVITE  Take 1 tablet (1 mg total) by mouth daily.     HYDROcodone-acetaminophen 10-325 MG per tablet  Commonly known as:  NORCO  Take 1 tablet by mouth every 6 (six) hours as needed for pain.     lisinopril 20 MG tablet  Commonly known as:  PRINIVIL,ZESTRIL  Take 20 mg by mouth 2 (two) times daily.     metoprolol 50 MG tablet  Commonly known as:  LOPRESSOR  Take 50 mg by mouth 2 (two) times daily.     multivitamin with minerals Tabs tablet  Take 1 tablet by mouth daily.     nitroGLYCERIN 0.4 MG SL tablet  Commonly known as:  NITROSTAT  Place 1 tablet (0.4 mg total) under the tongue every 5 (five) minutes as needed for chest pain (CP or SOB).     PRAVACHOL 20 MG tablet  Generic drug:  pravastatin  Take 20 mg by mouth daily.     thiamine 100 MG tablet  Take 1 tablet (100 mg total) by mouth daily.       No Known Allergies Follow-up Information   Follow up with Joni Reining, NP On 09/24/2012. (1:20)    Specialty:  Nurse Practitioner   Contact information:   [address] [place] [address] [phone]       Follow up with [facility] On 09/13/2012. (Stress Test; 8:30 am-NPO after midnight prior to test.)       Follow up with Kirk Ruths, MD. Schedule an appointment as soon as possible for a visit on 09/12/2012. (Follow-up appointment with Dr. Hassan Rowan (at [facility]) on Thursday, 09/12/2012 at 2:15 p.m. )    Specialty:  Family Medicine   Contact information:   [address] [place] [address] [phone]        The  results of significant diagnostics from this hospitalization (including imaging, microbiology, ancillary and laboratory) are listed below for reference.    Significant Diagnostic Studies: Dg Chest 2 View  09/03/2012   *RADIOLOGY REPORT*  Clinical Data: Chest pain  CHEST - 2 VIEW  Comparison:  October 16, 2005  Findings: There is underlying emphysematous change.  There is no edema or consolidation.  The heart size is normal.  The pulmonary vascularity reflects underlying emphysema.  No adenopathy.  There is upper thoracic levoscoliosis.  There is calcification in the right carotid artery.  IMPRESSION: Underlying emphysema.  No edema or consolidation.  Calcification in the right carotid artery.   Original Report Authenticated By: Bretta Bang, M.D.      Labs: Basic Metabolic Panel:  Recent Labs Lab 09/03/12 1400 09/04/12 0524  NA 129* 137  K 3.8 4.1  CL 92* 100  CO2 20 24  GLUCOSE 84 88  BUN 9 11  CREATININE 0.99 0.99  CALCIUM 9.9 9.6   CBC:  Recent Labs Lab 09/03/12 1400 09/04/12 0524  WBC 6.6 5.9  HGB 13.8 13.4  HCT 38.9* 39.0  MCV 92.6 94.9  PLT 233 222   Cardiac Enzymes:  Recent Labs Lab 09/03/12 1400 09/03/12 1924 09/04/12 0044 09/04/12 0524  TROPONINI <0.30 <0.30 <0.30 <0.30    Signed:  Stephani Police, PA-C  [facility] 09/05/2012, 10:32 AM

## 2012-09-05 NOTE — Progress Notes (Signed)
UR chart review completed.  

## 2012-09-05 NOTE — Progress Notes (Addendum)
Pt's BP elevated after recheck.  Dr. Irene Limbo was in room this morning and aware of pt's BP.  Notified Dr. Irene Limbo via text page of pt's BP after recheck and medicine.  Pt asymptomatic. SW had just been in room to speak with him and his family. Dr. Irene Limbo stated that patient was ok for discharge.  Orders followed.  AVS reviewed with patient.  Prescription provided to patient.  Patient educated on physician follow-up appointments and upcoming stress test.  Pt verbalized understanding of discharge instructions.  Pt's IV removed.  Site WNL.  Pt reports all belongings intact and in possession at discharge.  Pt transported by NT to main entrance for discharge.  Pt stable at time of discharge.

## 2012-09-11 ENCOUNTER — Other Ambulatory Visit: Payer: Self-pay | Admitting: *Deleted

## 2012-09-11 DIAGNOSIS — R079 Chest pain, unspecified: Secondary | ICD-10-CM

## 2012-09-13 ENCOUNTER — Encounter (HOSPITAL_COMMUNITY)
Admission: RE | Admit: 2012-09-13 | Discharge: 2012-09-13 | Disposition: A | Discharge status: Home / Self Care | Payer: BC Managed Care – PPO | Source: Ambulatory Visit | Attending: Cardiology | Admitting: Cardiology

## 2012-09-13 ENCOUNTER — Encounter (HOSPITAL_COMMUNITY): Payer: Self-pay

## 2012-09-13 DIAGNOSIS — R079 Chest pain, unspecified: Secondary | ICD-10-CM

## 2012-09-13 DIAGNOSIS — I498 Other specified cardiac arrhythmias: Secondary | ICD-10-CM | POA: Insufficient documentation

## 2012-09-13 DIAGNOSIS — R109 Unspecified abdominal pain: Secondary | ICD-10-CM | POA: Insufficient documentation

## 2012-09-13 DIAGNOSIS — R9439 Abnormal result of other cardiovascular function study: Secondary | ICD-10-CM | POA: Insufficient documentation

## 2012-09-13 DIAGNOSIS — R0789 Other chest pain: Secondary | ICD-10-CM | POA: Insufficient documentation

## 2012-09-13 MED ORDER — TECHNETIUM TC 99M SESTAMIBI - CARDIOLITE
30.0000 | Freq: Once | INTRAVENOUS | Status: AC | PRN
  Administered 2012-09-13: 11:00:00 30 via INTRAVENOUS

## 2012-09-13 MED ORDER — TECHNETIUM TC 99M SESTAMIBI - CARDIOLITE
10.0000 | Freq: Once | INTRAVENOUS | Status: AC | PRN
  Administered 2012-09-13: 09:00:00 10 via INTRAVENOUS

## 2012-09-13 MED ORDER — SODIUM CHLORIDE 0.9 % IJ SOLN
INTRAMUSCULAR | Status: AC
  Administered 2012-09-13: 10 mL via INTRAVENOUS
  Filled 2012-09-13: qty 10

## 2012-09-13 MED ORDER — REGADENOSON 0.4 MG/5ML IV SOLN
INTRAVENOUS | Status: AC
  Administered 2012-09-13: 0.4 mg via INTRAVENOUS
  Filled 2012-09-13: qty 5

## 2012-09-13 NOTE — Progress Notes (Signed)
Stress Lab Nurses Notes - [name]  [name] 09/13/2012 Reason for doing test: Chest Pain Type of test: [name] Nurse performing test: Parke Poisson, RN Nuclear Medicine Tech: Lyndel Pleasure Echo Tech: Not Applicable MD performing test: Dr. Wyline Mood / Joni Reining NP Family MD: Dr. Regino Schultze Test explained and consent signed: yes IV started: 22g jelco, Saline lock flushed, No redness or edema and Saline lock started in radiology Symptoms: Chest tightness & Stomach pressure Treatment/Intervention: None Reason test stopped: protocol completed After recovery IV was: Discontinued via X-ray tech and No redness or edema Patient to return to Nuc. Med at : 11:45 Patient discharged: Home Patient's Condition upon discharge was: stable Comments: During test BP 130/98 & HR 81.  Recovery BP 128/98 & HR 58.  Symptoms resolved in recovery. [name]

## 2012-09-24 ENCOUNTER — Encounter: Payer: BC Managed Care – PPO | Admitting: Adult Health

## 2012-09-25 ENCOUNTER — Encounter: Payer: Self-pay | Admitting: *Deleted

## 2012-09-25 ENCOUNTER — Ambulatory Visit (INDEPENDENT_AMBULATORY_CARE_PROVIDER_SITE_OTHER): Payer: BC Managed Care – PPO | Admitting: Cardiovascular Disease

## 2012-09-25 ENCOUNTER — Other Ambulatory Visit: Payer: Self-pay | Admitting: Cardiovascular Disease

## 2012-09-25 ENCOUNTER — Encounter: Payer: Self-pay | Admitting: Cardiovascular Disease

## 2012-09-25 VITALS — BP 157/87 | HR 65 | Ht 72.0 in | Wt 177.5 lb

## 2012-09-25 DIAGNOSIS — Z1211 Encounter for screening for malignant neoplasm of colon: Secondary | ICD-10-CM

## 2012-09-25 DIAGNOSIS — R079 Chest pain, unspecified: Secondary | ICD-10-CM

## 2012-09-25 DIAGNOSIS — R9439 Abnormal result of other cardiovascular function study: Secondary | ICD-10-CM | POA: Insufficient documentation

## 2012-09-25 DIAGNOSIS — Z01818 Encounter for other preprocedural examination: Secondary | ICD-10-CM

## 2012-09-25 NOTE — Progress Notes (Signed)
Patient ID: [name], male   DOB: [birth year], 52 y.o.   MRN: [id number]   SUBJECTIVE: I saw [name] in consultation during a hospitalization for chest pain at [facility]. He ruled out for an acute coronary syndrome, and underwent an echocardiogram which revealed normal LV systolic function, EF 60-65%, with no regional wall motion abnormalities and grade I diastolic dysfunction. His HTN was labile, and I increased his lisinopril from 20 to 40 mg daily. He had previously undergone a treadmill stress test in 2011 at [facility] which revealed inferolateral ST depressions, but no further cardiac testing had been completed until now. Upon discharge, I arranged for him to have a nuclear myocardial perfusion study, with the results noted below:  Impression: Lexiscan Sestamibi: Negative EKG for ischemia after Lexiscan injection. Abnormal sestamibi scan with evidence of moderate sized partially reversible inferior wall defect. Intermediate risk for major cardiac events. LVEF calculated at 51%.  Today, he denies chest pain, palpitations, shortness of breath, lightheadedness, and syncope. He's been undergoing a lot of stress at work and in his marriage. He used to drink 12 beers daily, but has cut back to 6 beers daily and is interested in quitting altogether, prompted by his wife's suggestion.  He also has GERD for he takes OTC Prilosec. His heartburn is always relieved with this. He's not had to take any SL nitro.     No Known Allergies  Current Outpatient Prescriptions  Medication Sig Dispense Refill  . aspirin EC 81 MG tablet Take 81 mg by mouth every morning.      Marland Kitchen HYDROcodone-acetaminophen (NORCO) 10-325 MG per tablet Take 1 tablet by mouth every 6 (six) hours as needed for pain.      Marland Kitchen lisinopril (PRINIVIL,ZESTRIL) 20 MG tablet Take 20 mg by mouth 2 (two) times daily.      . metoprolol (LOPRESSOR) 50 MG tablet Take 50 mg by mouth 2 (two) times daily.      . Multiple Vitamin  (MULTIVITAMIN WITH MINERALS) TABS tablet Take 1 tablet by mouth daily.      . nitroGLYCERIN (NITROSTAT) 0.4 MG SL tablet Place 1 tablet (0.4 mg total) under the tongue every 5 (five) minutes as needed for chest pain (CP or SOB).  30 tablet  1  . pravastatin (PRAVACHOL) 20 MG tablet Take 20 mg by mouth daily.       No current facility-administered medications for this visit.    Past Medical History  Diagnosis Date  . Hypertension   . Anxiety   . Chronic back pain   . Hypercholesteremia   . Anginal pain     Past Surgical History  Procedure Laterality Date  . Hemorrhoid surgery      History   Social History  . Marital Status: Married    Spouse Name: N/A    Number of Children: N/A  . Years of Education: N/A   Occupational History  . Not on file.   Social History Main Topics  . Smoking status: Never Smoker   . Smokeless tobacco: Not on file  . Alcohol Use: Yes     Comment: daily (6 pack per day per patient)  . Drug Use: No  . Sexual Activity: Not on file   Other Topics Concern  . Not on file   Social History Narrative  . No narrative on file     Filed Vitals:   09/25/12 1402  BP: 157/87  Pulse: 65  Height: 6' (1.829 m)  Weight: 177 lb  8 oz (80.513 kg)    PHYSICAL EXAM General: NAD Neck: No JVD, no thyromegaly or thyroid nodule.  Lungs: Clear to auscultation bilaterally with normal respiratory effort. CV: Nondisplaced PMI.  Heart regular S1/S2, no S3/S4, no murmur.  No peripheral edema.  No carotid bruit.  Normal pedal pulses.  Abdomen: Soft, nontender, no hepatosplenomegaly, no distention.  Neurologic: Alert and oriented x 3.  Psych: Normal affect. Extremities: No clubbing or cyanosis.   ECG: reviewed and available in electronic records.      ASSESSMENT AND PLAN: 1. Abnormal nuclear myocardial perfusion imaging study, in the setting of cardiovascular risk factors such as HTN and hypercholesterolemia: I have discussed the options with the patient,  and he has agreed to proceed with coronary angiography. He is currently taking ASA, metoprolol, and pravastatin, and has SL nitro should he need it. He is presently asymptomatic.   2. HTN: uncontrolled today, but patient says his BP had been 100/70 mmHg at his PCP's office. I've asked him to return to have his BP checked with a nurse in 2 weeks. If it remains elevated, I would add chlorthalidone 25 mg daily.  3. Alcohol abuse: I've encouraged him to speak to his PCP regarding this. He requests medical management options. I did encourage him to try an AA support group, but he has tried this before and is not interested at this time.   Prentice Docker, M.D., F.A.C.C.

## 2012-09-25 NOTE — Patient Instructions (Addendum)
Your physician recommends that you schedule a follow-up appointment in: POST CATH/ONE WEEK FOR BLOOD PRESSURE CHECK  Your physician has requested that you have a cardiac catheterization. Cardiac catheterization is used to diagnose and/or treat various heart conditions. Doctors may recommend this procedure for a number of different reasons. The most common reason is to evaluate chest pain. Chest pain can be a symptom of coronary artery disease (CAD), and cardiac catheterization can show whether plaque is narrowing or blocking your heart's arteries. This procedure is also used to evaluate the valves, as well as measure the blood flow and oxygen levels in different parts of your heart. For further information please visit [email or web address] Please follow instruction sheet, as given.OCTOBER 29-2014  Your physician recommends that you return for lab work in: [facility] October 15TH Your physician recommends that you have follow up lab work, we will mail you a reminder letter to alert you when to go [employer], located across the street from our office. (BMET,CBC,PT/PT/INR)

## 2012-10-18 ENCOUNTER — Other Ambulatory Visit: Payer: Self-pay | Admitting: *Deleted

## 2012-10-18 ENCOUNTER — Encounter: Payer: Self-pay | Admitting: *Deleted

## 2012-10-18 DIAGNOSIS — I10 Essential (primary) hypertension: Secondary | ICD-10-CM

## 2012-10-18 DIAGNOSIS — Z01818 Encounter for other preprocedural examination: Secondary | ICD-10-CM

## 2012-10-23 ENCOUNTER — Encounter (HOSPITAL_COMMUNITY): Payer: Self-pay | Admitting: Pharmacy Technician

## 2012-10-25 ENCOUNTER — Encounter (HOSPITAL_COMMUNITY): Payer: Self-pay | Admitting: Cardiology

## 2012-10-29 ENCOUNTER — Encounter: Payer: Self-pay | Admitting: *Deleted

## 2012-10-29 HISTORY — PX: CARDIAC CATHETERIZATION: SHX172

## 2012-10-29 LAB — BASIC METABOLIC PANEL
BUN: 12 mg/dL (ref 6–23)
CO2: 27 mEq/L (ref 19–32)
Calcium: 9.4 mg/dL (ref 8.4–10.5)
Chloride: 106 mEq/L (ref 96–112)
Creat: 1.23 mg/dL (ref 0.50–1.35)
Glucose, Bld: 83 mg/dL (ref 70–99)
Potassium: 4 mEq/L (ref 3.5–5.3)
Sodium: 140 mEq/L (ref 135–145)

## 2012-10-29 LAB — PROTIME-INR
INR: 0.98 (ref <1.50)
Prothrombin Time: 13 seconds (ref 11.6–15.2)

## 2012-10-29 LAB — CBC WITH DIFFERENTIAL/PLATELET
Basophils Absolute: 0 10*3/uL (ref 0.0–0.1)
Basophils Relative: 1 % (ref 0–1)
Eosinophils Absolute: 0.3 10*3/uL (ref 0.0–0.7)
Eosinophils Relative: 5 % (ref 0–5)
HCT: 38.9 % — ABNORMAL LOW (ref 39.0–52.0)
Hemoglobin: 13.4 g/dL (ref 13.0–17.0)
Lymphocytes Relative: 43 % (ref 12–46)
Lymphs Abs: 2.9 10*3/uL (ref 0.7–4.0)
MCH: 31.7 pg (ref 26.0–34.0)
MCHC: 34.4 g/dL (ref 30.0–36.0)
MCV: 92 fL (ref 78.0–100.0)
Monocytes Absolute: 0.8 10*3/uL (ref 0.1–1.0)
Monocytes Relative: 12 % (ref 3–12)
Neutro Abs: 2.6 10*3/uL (ref 1.7–7.7)
Neutrophils Relative %: 39 % — ABNORMAL LOW (ref 43–77)
Platelets: 262 10*3/uL (ref 150–400)
RBC: 4.23 MIL/uL (ref 4.22–5.81)
RDW: 13.6 % (ref 11.5–15.5)
WBC: 6.7 10*3/uL (ref 4.0–10.5)

## 2012-10-29 LAB — APTT: aPTT: 28 seconds (ref 24–37)

## 2012-10-29 NOTE — Addendum Note (Signed)
Addended by: Derry Lory A on: 10/29/2012 01:34 PM   Modules accepted: Orders

## 2012-10-30 ENCOUNTER — Telehealth: Payer: Self-pay

## 2012-10-30 ENCOUNTER — Other Ambulatory Visit: Payer: Self-pay | Admitting: Cardiovascular Disease

## 2012-10-30 ENCOUNTER — Encounter (HOSPITAL_COMMUNITY)
Admission: RE | Disposition: A | Discharge status: Home / Self Care | Payer: Self-pay | Source: Ambulatory Visit | Attending: Cardiology

## 2012-10-30 ENCOUNTER — Ambulatory Visit (HOSPITAL_COMMUNITY)
Admission: RE | Admit: 2012-10-30 | Discharge: 2012-10-30 | Disposition: A | Discharge status: Home / Self Care | Payer: BC Managed Care – PPO | Source: Ambulatory Visit | Attending: Cardiology | Admitting: Cardiology

## 2012-10-30 DIAGNOSIS — I2510 Atherosclerotic heart disease of native coronary artery without angina pectoris: Secondary | ICD-10-CM | POA: Insufficient documentation

## 2012-10-30 DIAGNOSIS — R079 Chest pain, unspecified: Secondary | ICD-10-CM | POA: Insufficient documentation

## 2012-10-30 DIAGNOSIS — I10 Essential (primary) hypertension: Secondary | ICD-10-CM | POA: Insufficient documentation

## 2012-10-30 DIAGNOSIS — M549 Dorsalgia, unspecified: Secondary | ICD-10-CM | POA: Insufficient documentation

## 2012-10-30 DIAGNOSIS — G8929 Other chronic pain: Secondary | ICD-10-CM | POA: Insufficient documentation

## 2012-10-30 DIAGNOSIS — Z7982 Long term (current) use of aspirin: Secondary | ICD-10-CM | POA: Insufficient documentation

## 2012-10-30 DIAGNOSIS — F411 Generalized anxiety disorder: Secondary | ICD-10-CM | POA: Insufficient documentation

## 2012-10-30 DIAGNOSIS — Z79899 Other long term (current) drug therapy: Secondary | ICD-10-CM | POA: Insufficient documentation

## 2012-10-30 DIAGNOSIS — R9439 Abnormal result of other cardiovascular function study: Secondary | ICD-10-CM

## 2012-10-30 DIAGNOSIS — I209 Angina pectoris, unspecified: Secondary | ICD-10-CM | POA: Insufficient documentation

## 2012-10-30 DIAGNOSIS — E7800 Pure hypercholesterolemia, unspecified: Secondary | ICD-10-CM | POA: Insufficient documentation

## 2012-10-30 HISTORY — PX: LEFT HEART CATHETERIZATION WITH CORONARY ANGIOGRAM: SHX5451

## 2012-10-30 SURGERY — LEFT HEART CATHETERIZATION WITH CORONARY ANGIOGRAM
Anesthesia: LOCAL

## 2012-10-30 MED ORDER — FENTANYL CITRATE 0.05 MG/ML IJ SOLN
INTRAMUSCULAR | Status: AC
  Filled 2012-10-30: qty 2

## 2012-10-30 MED ORDER — SODIUM CHLORIDE 0.9 % IV SOLN: 250.0000 mL | INTRAVENOUS | Status: DC | PRN

## 2012-10-30 MED ORDER — ASPIRIN 81 MG PO CHEW
CHEWABLE_TABLET | ORAL | Status: AC
  Filled 2012-10-30: qty 4

## 2012-10-30 MED ORDER — LIDOCAINE HCL (PF) 1 % IJ SOLN
INTRAMUSCULAR | Status: AC
  Filled 2012-10-30: qty 30

## 2012-10-30 MED ORDER — ACETAMINOPHEN 325 MG PO TABS: 650.0000 mg | ORAL_TABLET | ORAL | Status: DC | PRN

## 2012-10-30 MED ORDER — HEPARIN (PORCINE) IN NACL 2-0.9 UNIT/ML-% IJ SOLN
INTRAMUSCULAR | Status: AC
  Filled 2012-10-30: qty 1500

## 2012-10-30 MED ORDER — SODIUM CHLORIDE 0.9 % IJ SOLN: 3.0000 mL | Freq: Two times a day (BID) | INTRAMUSCULAR | Status: DC

## 2012-10-30 MED ORDER — MIDAZOLAM HCL 2 MG/2ML IJ SOLN
INTRAMUSCULAR | Status: AC
  Filled 2012-10-30: qty 2

## 2012-10-30 MED ORDER — VERAPAMIL HCL 2.5 MG/ML IV SOLN
INTRAVENOUS | Status: AC
  Filled 2012-10-30: qty 2

## 2012-10-30 MED ORDER — NITROGLYCERIN 0.2 MG/ML ON CALL CATH LAB
INTRAVENOUS | Status: AC
  Filled 2012-10-30: qty 1

## 2012-10-30 MED ORDER — SODIUM CHLORIDE 0.9 % IJ SOLN: 3.0000 mL | INTRAMUSCULAR | Status: DC | PRN

## 2012-10-30 MED ORDER — SODIUM CHLORIDE 0.9 % IV SOLN: INTRAVENOUS | Status: DC

## 2012-10-30 MED ORDER — CLOPIDOGREL BISULFATE 75 MG PO TABS: 75.0000 mg | ORAL_TABLET | Freq: Every day | ORAL | Status: DC

## 2012-10-30 MED ORDER — ASPIRIN 81 MG PO CHEW
324.0000 mg | CHEWABLE_TABLET | ORAL | Status: AC
Start: 2012-10-30 — End: 2012-10-30
  Administered 2012-10-30: 324 mg via ORAL

## 2012-10-30 MED ORDER — HEPARIN SODIUM (PORCINE) 1000 UNIT/ML IJ SOLN
INTRAMUSCULAR | Status: AC
  Filled 2012-10-30: qty 1

## 2012-10-30 MED ORDER — SODIUM CHLORIDE 0.9 % IV SOLN
INTRAVENOUS | Status: DC
  Administered 2012-10-30: 07:00:00 via INTRAVENOUS

## 2012-10-30 NOTE — CV Procedure (Signed)
   Cardiac Catheterization Procedure Note  Name: [name] MRN: [id number] DOB: 05/24/1960  Procedure: Left Heart Cath, Selective Coronary Angiography, LV angiography  Indication: Chest pain with abnormal stress test.  Medications:  Sedation:  1 mg IV Versed, 25 and a mcg IV Fentanyl    Procedural Details: The right wrist was prepped, draped, and anesthetized with 1% lidocaine. Using the modified Seldinger technique, a 5 French sheath was introduced into the right radial artery. 3 mg of verapamil was administered through the sheath, weight-based unfractionated heparin was administered intravenously. Standard Judkins catheters were used for selective coronary angiography. A pigtail catheter was used for left ventriculography. Catheter exchanges were performed over an exchange length guidewire. There were no immediate procedural complications. A TR band was used for radial hemostasis at the completion of the procedure.  The patient was transferred to the post catheterization recovery area for further monitoring.  Procedural Findings:  Hemodynamics: AO:  14377   mmHg LV:  14311    mmHg LVEDP: 19  mmHg  Coronary angiography: Coronary dominance: right   Left Main:  Mildly calcified with no significant disease.  Left Anterior Descending (LAD):  Severely calcified proximally with 80-90% heavily calcified eccentric lesion and proximally at the origin of the large diagonal branch. There is a 40% mid stenosis. There is of the vessel has minor irregularities.  1st diagonal (D1):  Large in size with 40% ostial stenosis.  2nd diagonal (D2):  Very small in size.  3rd diagonal (D3):  Very small in size.  Circumflex (LCx):  Normal in size and moderately calcified proximally. There is diffuse 40% disease in the midsegment.  1st obtuse marginal:  Normal in size with no significant disease.  2nd obtuse marginal:  Normal in size with no significant disease.  3rd obtuse marginal:  Small in  size with minor irregularities.   Right Coronary Artery: Medium in size and significantly calcified proximally. 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 There is a 40% disease in the mid RCA.  Posterior descending artery: This is mostly supplied by a large RV branch.  Posterior AV segment: Small in size with minor irregularities.  Posterolateral branchs:  Small in size branches.  Left ventriculography: Left ventricular systolic function is low normal , LVEF is estimated at 50 %, there is no significant mitral regurgitation . Mild anterior wall hypokinesis.  Final Conclusions:   1. Severe heavily calcified proximal LAD stenosis at the origin of a large diagonal branch with moderate disease in the right coronary artery. 2. Low normal LV systolic function and mildly elevated left ventricular end-diastolic pressure.   Recommendations:  PCI with atherectomy of the LAD versus CABG. This will be discussed with the patient and family.  Lorine Bears MD, [facility] 10/30/2012, 9:31 AM

## 2012-10-30 NOTE — Interval H&P Note (Signed)
Cath Lab Visit (complete for each Cath Lab visit)  Clinical Evaluation Leading to the Procedure:   ACS: no  Non-ACS:  yes  Anginal Classification: CCS II  Anti-ischemic medical therapy: Minimal Therapy (1 class of medications)  Non-Invasive Test Results: Intermediate-risk stress test findings: cardiac mortality 1-3%/year  Prior CABG: No previous CABG      History and Physical Interval Note:  10/30/2012 8:47 AM  [name]  has presented today for surgery, with the diagnosis of Chest pain  The various methods of treatment have been discussed with the patient and family. After consideration of risks, benefits and other options for treatment, the patient has consented to  Procedure(s): LEFT HEART CATHETERIZATION WITH CORONARY ANGIOGRAM (N/A) as a surgical intervention .  The patient's history has been reviewed, patient examined, no change in status, stable for surgery.  I have reviewed the patient's chart and labs.  Questions were answered to the patient's satisfaction.     Lorine Bears

## 2012-10-30 NOTE — Telephone Encounter (Signed)
patient wife call about a plavix rx being called in from cath lab sent to triage

## 2012-10-30 NOTE — H&P (Signed)
 History and Physical  Patient ID: [name] MRN: [id number] DOB/AGE: 05/03/1960 52 y.o. Admit date: 10/30/2012  Primary Care Physician: MCGOUGH,WILLIAM M, MD   HPI: I saw Mr. Musil was referred for cardiac cath after having chest pain and abnormal stress tests.  He had a hospitalization for chest pain at [facility] last month. He ruled out for an acute coronary syndrome, and underwent an echocardiogram which revealed normal LV systolic function, EF 60-65%, with no regional wall motion abnormalities and grade I diastolic dysfunction. Upon discharge, he had a nuclear myocardial perfusion study, with the results noted below:  Impression: Lexiscan Sestamibi: Negative EKG for ischemia after Lexiscan injection. Abnormal sestamibi scan with evidence of moderate sized partially reversible inferior wall defect. Intermediate risk for major cardiac events. LVEF calculated at 51%.     Review of systems complete and found to be negative unless listed above  Past Medical History  Diagnosis Date  . Hypertension   . Anxiety   . Chronic back pain   . Hypercholesteremia   . Anginal pain     No family history on file.  History   Social History  . Marital Status: Married    Spouse Name: N/A    Number of Children: N/A  . Years of Education: N/A   Occupational History  . Not on file.   Social History Main Topics  . Smoking status: Never Smoker   . Smokeless tobacco: Not on file  . Alcohol Use: Yes     Comment: daily (6 pack per day per patient)  . Drug Use: No  . Sexual Activity: Not on file   Other Topics Concern  . Not on file   Social History Narrative  . No narrative on file    Past Surgical History  Procedure Laterality Date  . Hemorrhoid surgery       Prescriptions prior to admission  Medication Sig Dispense Refill  . aspirin EC 81 MG tablet Take 81 mg by mouth every morning.      . diazepam (VALIUM) 10 MG tablet Take 10 mg by mouth every 8 (eight) hours  as needed (for muscle spasms).      . HYDROcodone-acetaminophen (NORCO) 10-325 MG per tablet Take 1 tablet by mouth every 6 (six) hours as needed for pain.      . lisinopril (PRINIVIL,ZESTRIL) 20 MG tablet Take 20 mg by mouth 2 (two) times daily.      . metoprolol (LOPRESSOR) 50 MG tablet Take 50 mg by mouth 2 (two) times daily.      . Multiple Vitamin (MULTIVITAMIN WITH MINERALS) TABS tablet Take 1 tablet by mouth daily.      . pravastatin (PRAVACHOL) 20 MG tablet Take 20 mg by mouth daily.      . tamsulosin (FLOMAX) 0.4 MG CAPS capsule Take 0.4 mg by mouth daily.      . nitroGLYCERIN (NITROSTAT) 0.4 MG SL tablet Place 1 tablet (0.4 mg total) under the tongue every 5 (five) minutes as needed for chest pain (CP or SOB).  30 tablet  1    Physical Exam: Blood pressure 114/69, pulse 54, temperature 97.7 F (36.5 C), temperature source Oral, resp. rate 18, height 5' 7" (1.702 m), weight 73.936 kg (163 lb), SpO2 96.00%.    Constitutional: He is oriented to person, place, and time. He appears well-developed and well-nourished. No distress.  HENT: No nasal discharge.  Head: Normocephalic and atraumatic.  Eyes: Pupils are equal and round.  No discharge.   Neck: Normal range of motion. Neck supple. No JVD present. No thyromegaly present.  Cardiovascular: Normal rate, regular rhythm, normal heart sounds. Exam reveals no gallop and no friction rub. No murmur heard.  Pulmonary/Chest: Effort normal and breath sounds normal. No stridor. No respiratory distress. He has no wheezes. He has no rales. He exhibits no tenderness.  Abdominal: Soft. Bowel sounds are normal. He exhibits no distension. There is no tenderness. There is no rebound and no guarding.  Musculoskeletal: Normal range of motion. He exhibits no edema and no tenderness.  Neurological: He is alert and oriented to person, place, and time. Coordination normal.  Skin: Skin is warm and dry. No rash noted. He is not diaphoretic. No erythema. No pallor.   Psychiatric: He has a normal mood and affect. His behavior is normal. Judgment and thought content normal.      Labs:   Lab Results  Component Value Date   WBC 6.7 10/28/2012   HGB 13.4 10/28/2012   HCT 38.9* 10/28/2012   MCV 92.0 10/28/2012   PLT 262 10/28/2012    Recent Labs Lab 10/28/12 1558  NA 140  K 4.0  CL 106  CO2 27  BUN 12  CREATININE 1.23  CALCIUM 9.4  GLUCOSE 83   Lab Results  Component Value Date   TROPONINI <0.30 09/04/2012    Lab Results  Component Value Date   CHOL 166 09/04/2012   Lab Results  Component Value Date   HDL 71 09/04/2012   Lab Results  Component Value Date   LDLCALC 77 09/04/2012   Lab Results  Component Value Date   TRIG 89 09/04/2012   Lab Results  Component Value Date   CHOLHDL 2.3 09/04/2012   No results found for this basename: LDLDIRECT      Radiology: No results found.    ASSESSMENT AND PLAN:  Atypical chest pain with abnormal stress test. Cardiac cath today. I answered all his questions.   Signed: Charmeka Freeburg MD, FACC 10/30/2012, 8:43 AM    

## 2012-10-30 NOTE — Progress Notes (Signed)
Discharge instruction given per MD order.  Dressing to right radial site applied.  Pt and CG able to verbalize instructions.  Pt denies any discomfort at this time.

## 2012-10-31 ENCOUNTER — Telehealth: Payer: Self-pay

## 2012-10-31 ENCOUNTER — Encounter (HOSPITAL_COMMUNITY): Payer: Self-pay

## 2012-10-31 NOTE — Telephone Encounter (Signed)
Received a call from [facility] in refills 10/30/12 stated patient discharged from [facility] hospital today, patient was told to start on plavix and medication not at pharmacy.Rosann Auerbach was called and plavix prescription was sent to [facility] in [place].[facility] in [place] called and plavix had to be ordered and will have prescription ready 10/31/12.Rosann Auerbach also stated patient scheduled for cath 11/05/12 at 9:00 am patient to arrive at 7:00 am NPO after midnight.Wife was told and she understood.

## 2012-11-04 ENCOUNTER — Other Ambulatory Visit: Payer: Self-pay | Admitting: Cardiovascular Disease

## 2012-11-04 ENCOUNTER — Other Ambulatory Visit: Payer: Self-pay | Admitting: *Deleted

## 2012-11-04 DIAGNOSIS — I10 Essential (primary) hypertension: Secondary | ICD-10-CM

## 2012-11-04 DIAGNOSIS — Z7901 Long term (current) use of anticoagulants: Secondary | ICD-10-CM

## 2012-11-04 DIAGNOSIS — I2510 Atherosclerotic heart disease of native coronary artery without angina pectoris: Secondary | ICD-10-CM

## 2012-11-04 DIAGNOSIS — R899 Unspecified abnormal finding in specimens from other organs, systems and tissues: Secondary | ICD-10-CM

## 2012-11-05 ENCOUNTER — Ambulatory Visit (HOSPITAL_COMMUNITY)
Admission: RE | Admit: 2012-11-05 | Discharge: 2012-11-06 | Disposition: A | Discharge status: Home / Self Care | Payer: BC Managed Care – PPO | Source: Ambulatory Visit | Attending: Cardiovascular Disease | Admitting: Cardiovascular Disease

## 2012-11-05 ENCOUNTER — Encounter (HOSPITAL_COMMUNITY): Payer: Self-pay | Admitting: General Practice

## 2012-11-05 ENCOUNTER — Encounter (HOSPITAL_COMMUNITY)
Admission: RE | Disposition: A | Discharge status: Home / Self Care | Payer: BC Managed Care – PPO | Source: Ambulatory Visit | Attending: Cardiovascular Disease

## 2012-11-05 DIAGNOSIS — R001 Bradycardia, unspecified: Secondary | ICD-10-CM

## 2012-11-05 DIAGNOSIS — I209 Angina pectoris, unspecified: Secondary | ICD-10-CM | POA: Insufficient documentation

## 2012-11-05 DIAGNOSIS — M549 Dorsalgia, unspecified: Secondary | ICD-10-CM | POA: Insufficient documentation

## 2012-11-05 DIAGNOSIS — I498 Other specified cardiac arrhythmias: Secondary | ICD-10-CM | POA: Insufficient documentation

## 2012-11-05 DIAGNOSIS — I10 Essential (primary) hypertension: Secondary | ICD-10-CM | POA: Diagnosis present

## 2012-11-05 DIAGNOSIS — F411 Generalized anxiety disorder: Secondary | ICD-10-CM | POA: Insufficient documentation

## 2012-11-05 DIAGNOSIS — E785 Hyperlipidemia, unspecified: Secondary | ICD-10-CM | POA: Diagnosis present

## 2012-11-05 DIAGNOSIS — Z79899 Other long term (current) drug therapy: Secondary | ICD-10-CM | POA: Insufficient documentation

## 2012-11-05 DIAGNOSIS — Z23 Encounter for immunization: Secondary | ICD-10-CM | POA: Insufficient documentation

## 2012-11-05 DIAGNOSIS — I2510 Atherosclerotic heart disease of native coronary artery without angina pectoris: Secondary | ICD-10-CM | POA: Insufficient documentation

## 2012-11-05 HISTORY — PX: PERCUTANEOUS CORONARY ROTOBLATOR INTERVENTION (PCI-R): SHX5484

## 2012-11-05 HISTORY — PX: CORONARY ANGIOPLASTY WITH STENT PLACEMENT: SHX49

## 2012-11-05 HISTORY — DX: Atherosclerotic heart disease of native coronary artery without angina pectoris: I25.10

## 2012-11-05 HISTORY — DX: Gastro-esophageal reflux disease without esophagitis: K21.9

## 2012-11-05 HISTORY — DX: Bradycardia, unspecified: R00.1

## 2012-11-05 HISTORY — DX: Low back pain, unspecified: M54.50

## 2012-11-05 HISTORY — DX: Low back pain: M54.5

## 2012-11-05 HISTORY — DX: Other chronic pain: G89.29

## 2012-11-05 LAB — PROTIME-INR
INR: 0.99 (ref 0.00–1.49)
Prothrombin Time: 12.9 seconds (ref 11.6–15.2)

## 2012-11-05 LAB — BASIC METABOLIC PANEL
BUN: 11 mg/dL (ref 6–23)
CO2: 25 mEq/L (ref 19–32)
Calcium: 9 mg/dL (ref 8.4–10.5)
Chloride: 97 mEq/L (ref 96–112)
Creatinine, Ser: 1.19 mg/dL (ref 0.50–1.35)
GFR calc Af Amer: 80 mL/min — ABNORMAL LOW (ref >90)
GFR calc non Af Amer: 69 mL/min — ABNORMAL LOW (ref >90)
Glucose, Bld: 95 mg/dL (ref 70–99)
Potassium: 3.7 mEq/L (ref 3.5–5.1)
Sodium: 132 mEq/L — ABNORMAL LOW (ref 135–145)

## 2012-11-05 LAB — CBC
HCT: 37.3 % — ABNORMAL LOW (ref 39.0–52.0)
Hemoglobin: 12.8 g/dL — ABNORMAL LOW (ref 13.0–17.0)
MCH: 31.5 pg (ref 26.0–34.0)
MCHC: 34.3 g/dL (ref 30.0–36.0)
MCV: 91.9 fL (ref 78.0–100.0)
Platelets: 222 10*3/uL (ref 150–400)
RBC: 4.06 MIL/uL — ABNORMAL LOW (ref 4.22–5.81)
RDW: 12.3 % (ref 11.5–15.5)
WBC: 7.2 10*3/uL (ref 4.0–10.5)

## 2012-11-05 LAB — PLATELET INHIBITION P2Y12: Platelet Function  P2Y12: 179 [PRU] — ABNORMAL LOW (ref 194–418)

## 2012-11-05 LAB — POCT ACTIVATED CLOTTING TIME: Activated Clotting Time: 386 seconds

## 2012-11-05 SURGERY — PERCUTANEOUS CORONARY ROTOBLATOR INTERVENTION (PCI-R)
Anesthesia: LOCAL

## 2012-11-05 MED ORDER — TAMSULOSIN HCL 0.4 MG PO CAPS
0.4000 mg | ORAL_CAPSULE | Freq: Every day | ORAL | Status: DC
  Administered 2012-11-06: 10:00:00 0.4 mg via ORAL
  Filled 2012-11-05: qty 1

## 2012-11-05 MED ORDER — ATORVASTATIN CALCIUM 40 MG PO TABS
40.0000 mg | ORAL_TABLET | Freq: Every day | ORAL | Status: DC
  Administered 2012-11-05: 22:00:00 40 mg via ORAL
  Filled 2012-11-05 (×2): qty 1

## 2012-11-05 MED ORDER — LIDOCAINE HCL (PF) 1 % IJ SOLN
INTRAMUSCULAR | Status: AC
  Filled 2012-11-05: qty 30

## 2012-11-05 MED ORDER — HYDROCODONE-ACETAMINOPHEN 10-325 MG PO TABS
1.0000 | ORAL_TABLET | Freq: Four times a day (QID) | ORAL | Status: DC | PRN
  Administered 2012-11-05 (×2): 1 via ORAL
  Filled 2012-11-05 (×2): qty 1

## 2012-11-05 MED ORDER — ASPIRIN 81 MG PO CHEW: 81.0000 mg | CHEWABLE_TABLET | ORAL | Status: DC

## 2012-11-05 MED ORDER — ACETAMINOPHEN 325 MG PO TABS: 650.0000 mg | ORAL_TABLET | ORAL | Status: DC | PRN

## 2012-11-05 MED ORDER — ONDANSETRON HCL 4 MG/2ML IJ SOLN
4.0000 mg | Freq: Four times a day (QID) | INTRAMUSCULAR | Status: DC | PRN
  Administered 2012-11-05: 4 mg via INTRAVENOUS
  Filled 2012-11-05: qty 2

## 2012-11-05 MED ORDER — METOPROLOL TARTRATE 25 MG PO TABS
25.0000 mg | ORAL_TABLET | Freq: Two times a day (BID) | ORAL | Status: DC
  Administered 2012-11-06: 10:00:00 25 mg via ORAL
  Filled 2012-11-05 (×2): qty 1

## 2012-11-05 MED ORDER — ASPIRIN 81 MG PO CHEW
81.0000 mg | CHEWABLE_TABLET | Freq: Every day | ORAL | Status: DC
  Administered 2012-11-06: 81 mg via ORAL
  Filled 2012-11-05: qty 1

## 2012-11-05 MED ORDER — FENTANYL CITRATE 0.05 MG/ML IJ SOLN
INTRAMUSCULAR | Status: AC
  Filled 2012-11-05: qty 2

## 2012-11-05 MED ORDER — LISINOPRIL 20 MG PO TABS
20.0000 mg | ORAL_TABLET | Freq: Two times a day (BID) | ORAL | Status: DC
  Administered 2012-11-06: 10:00:00 20 mg via ORAL
  Filled 2012-11-05 (×3): qty 1

## 2012-11-05 MED ORDER — BIVALIRUDIN 250 MG IV SOLR
INTRAVENOUS | Status: AC
  Filled 2012-11-05: qty 250

## 2012-11-05 MED ORDER — SODIUM CHLORIDE 0.9 % IV SOLN: INTRAVENOUS | Status: AC

## 2012-11-05 MED ORDER — VERAPAMIL HCL 2.5 MG/ML IV SOLN
INTRAVENOUS | Status: AC
  Filled 2012-11-05: qty 2

## 2012-11-05 MED ORDER — SODIUM CHLORIDE 0.9 % IJ SOLN: 3.0000 mL | Freq: Two times a day (BID) | INTRAMUSCULAR | Status: DC

## 2012-11-05 MED ORDER — SODIUM CHLORIDE 0.9 % IV SOLN
INTRAVENOUS | Status: DC
  Administered 2012-11-05: 08:00:00 via INTRAVENOUS

## 2012-11-05 MED ORDER — HYDRALAZINE HCL 20 MG/ML IJ SOLN
10.0000 mg | INTRAMUSCULAR | Status: DC | PRN
  Administered 2012-11-05: 13:00:00 10 mg via INTRAVENOUS

## 2012-11-05 MED ORDER — SODIUM CHLORIDE 0.9 % IV SOLN
0.2500 mg/kg/h | INTRAVENOUS | Status: AC
  Administered 2012-11-05: 0.25 mg/kg/h via INTRAVENOUS
  Filled 2012-11-05: qty 250

## 2012-11-05 MED ORDER — NITROGLYCERIN 0.4 MG SL SUBL: 0.4000 mg | SUBLINGUAL_TABLET | SUBLINGUAL | Status: DC | PRN

## 2012-11-05 MED ORDER — INFLUENZA VAC SPLIT QUAD 0.5 ML IM SUSP
0.5000 mL | INTRAMUSCULAR | Status: AC
  Administered 2012-11-06: 10:00:00 0.5 mL via INTRAMUSCULAR
  Filled 2012-11-05: qty 0.5

## 2012-11-05 MED ORDER — SODIUM CHLORIDE 0.9 % IJ SOLN: 3.0000 mL | INTRAMUSCULAR | Status: DC | PRN

## 2012-11-05 MED ORDER — HYDRALAZINE HCL 20 MG/ML IJ SOLN
INTRAMUSCULAR | Status: AC
  Filled 2012-11-05: qty 1

## 2012-11-05 MED ORDER — SODIUM CHLORIDE 0.9 % IV SOLN: 250.0000 mL | INTRAVENOUS | Status: DC | PRN

## 2012-11-05 MED ORDER — HEPARIN SODIUM (PORCINE) 1000 UNIT/ML IJ SOLN
INTRAMUSCULAR | Status: AC
  Filled 2012-11-05: qty 1

## 2012-11-05 MED ORDER — HEPARIN (PORCINE) IN NACL 2-0.9 UNIT/ML-% IJ SOLN
INTRAMUSCULAR | Status: AC
  Filled 2012-11-05: qty 1500

## 2012-11-05 MED ORDER — NITROGLYCERIN 0.2 MG/ML ON CALL CATH LAB
INTRAVENOUS | Status: AC
  Filled 2012-11-05: qty 1

## 2012-11-05 MED ORDER — CLOPIDOGREL BISULFATE 75 MG PO TABS
75.0000 mg | ORAL_TABLET | Freq: Every day | ORAL | Status: DC
  Administered 2012-11-06: 10:00:00 75 mg via ORAL
  Filled 2012-11-05: qty 1

## 2012-11-05 MED ORDER — CLOPIDOGREL BISULFATE 75 MG PO TABS: 75.0000 mg | ORAL_TABLET | ORAL | Status: DC

## 2012-11-05 MED ORDER — MIDAZOLAM HCL 2 MG/2ML IJ SOLN
INTRAMUSCULAR | Status: AC
  Filled 2012-11-05: qty 2

## 2012-11-05 NOTE — Interval H&P Note (Signed)
Cath Lab Visit (complete for each Cath Lab visit)  Clinical Evaluation Leading to the Procedure:   ACS: no  Non-ACS:    Anginal Classification: CCS III  Anti-ischemic medical therapy: Minimal Therapy (1 class of medications)  Non-Invasive Test Results: Equivocal test results  Prior CABG: No previous CABG      History and Physical Interval Note:  11/05/2012 8:54 AM  [name]  has presented today for surgery, with the diagnosis of blockage  The various methods of treatment have been discussed with the patient and family. After consideration of risks, benefits and other options for treatment, the patient has consented to  Procedure(s): PERCUTANEOUS CORONARY ROTOBLATOR INTERVENTION (PCI-R) (N/A) as a surgical intervention .  The patient's history has been reviewed, patient examined, no change in status, stable for surgery.  I have reviewed the patient's chart and labs.  Questions were answered to the patient's satisfaction.     Lorine Bears

## 2012-11-05 NOTE — CV Procedure (Signed)
   CARDIAC CATH NOTE  Name: RAKESH DUTKO MRN: [id number] DOB: [birth year]  Procedure: Rotational atherectomy of the proximal LAD , PTCA and stenting of the proximal LAD  Indication: Angina on medical therapy with equivocal stress test and 2 vessel coronary artery disease involving the proximal LAD.  Medications:  Sedation:  2 mg IV Versed, 50 mcg IV Fentanyl  Contrast:  105 Omnipaque  Procedural Details: The right groin was prepped, draped, and anesthetized with 1% lidocaine. Using the modified Seldinger technique, a 7 Fr sheath was introduced into the right femoral artery.  Weight-based bivalirudin was given for anticoagulation. Once a therapeutic ACT was achieved, a 7 [place] XB LAD 4 guide catheter was inserted.  A long run through coronary guidewire was used to cross the lesion with an OTW balloon. The wire was removed and then I advanced the Rota floppy wire. I then used a 1.5 mm bur with multiple runs across the lesion. This was then exchanged into a 1.75 mm bur with multiple runs across the lesion.  The lesion was predilated with a 2.75 x 20 mm noncompliant balloon.  The lesion was then stented with a 3.0 x 28 mm Promus drug-eluting stent.  The stent was postdilated with a 3.25 mm noncompliant balloon.  Following PCI, there was 0% residual stenosis and TIMI-3 flow. Final angiography confirmed an excellent result. The diagonal was jailed by the stent but had normal TIMI-3 flow. The patient tolerated the procedure well. There were no immediate procedural complications. Femoral hemostasis will be achieved with manual pressure. There was significant calcifications noted in the common femoral artery. The patient was transferred to the post catheterization recovery area for further monitoring.  PCI Data: Vessel - proximal LAD/Segment - 12 Percent Stenosis (pre)  80% TIMI-flow 3 Stent 3.0 x 28 mm Promus drug-eluting stent Percent Stenosis (post) 0% TIMI-flow (post) 3  Final Conclusions:    Successful rotational atherectomy and drug-eluting stent placement to the proximal LAD.  Recommendations:  Dual antiplatelet therapy for at least 12 months. The patient was noted to be bradycardic and thus I decreased the dose of metoprolol to 25 mg twice daily. He will be started on a statin as well.  Lorine Bears MD, [facility] 11/05/2012, 10:27 AM

## 2012-11-05 NOTE — H&P (View-Only) (Signed)
History and Physical  Patient ID: [name] MRN: [id number] DOB/AGE: 04-08-1960 52 y.o. Admit date: 10/30/2012  Primary Care Physician: Kirk Ruths, MD   HPI: I saw [name] was referred for cardiac cath after having chest pain and abnormal stress tests.  He had a hospitalization for chest pain at [facility] last month. He ruled out for an acute coronary syndrome, and underwent an echocardiogram which revealed normal LV systolic function, EF 60-65%, with no regional wall motion abnormalities and grade I diastolic dysfunction. Upon discharge, he had a nuclear myocardial perfusion study, with the results noted below:  Impression: Lexiscan Sestamibi: Negative EKG for ischemia after Lexiscan injection. Abnormal sestamibi scan with evidence of moderate sized partially reversible inferior wall defect. Intermediate risk for major cardiac events. LVEF calculated at 51%.     Review of systems complete and found to be negative unless listed above  Past Medical History  Diagnosis Date  . Hypertension   . Anxiety   . Chronic back pain   . Hypercholesteremia   . Anginal pain     No family history on file.  History   Social History  . Marital Status: Married    Spouse Name: N/A    Number of Children: N/A  . Years of Education: N/A   Occupational History  . Not on file.   Social History Main Topics  . Smoking status: Never Smoker   . Smokeless tobacco: Not on file  . Alcohol Use: Yes     Comment: daily (6 pack per day per patient)  . Drug Use: No  . Sexual Activity: Not on file   Other Topics Concern  . Not on file   Social History Narrative  . No narrative on file    Past Surgical History  Procedure Laterality Date  . Hemorrhoid surgery       Prescriptions prior to admission  Medication Sig Dispense Refill  . aspirin EC 81 MG tablet Take 81 mg by mouth every morning.      . diazepam (VALIUM) 10 MG tablet Take 10 mg by mouth every 8 (eight) hours  as needed (for muscle spasms).      Marland Kitchen HYDROcodone-acetaminophen (NORCO) 10-325 MG per tablet Take 1 tablet by mouth every 6 (six) hours as needed for pain.      Marland Kitchen lisinopril (PRINIVIL,ZESTRIL) 20 MG tablet Take 20 mg by mouth 2 (two) times daily.      . metoprolol (LOPRESSOR) 50 MG tablet Take 50 mg by mouth 2 (two) times daily.      . Multiple Vitamin (MULTIVITAMIN WITH MINERALS) TABS tablet Take 1 tablet by mouth daily.      . pravastatin (PRAVACHOL) 20 MG tablet Take 20 mg by mouth daily.      . tamsulosin (FLOMAX) 0.4 MG CAPS capsule Take 0.4 mg by mouth daily.      . nitroGLYCERIN (NITROSTAT) 0.4 MG SL tablet Place 1 tablet (0.4 mg total) under the tongue every 5 (five) minutes as needed for chest pain (CP or SOB).  30 tablet  1    Physical Exam: Blood pressure 114/69, pulse 54, temperature 97.7 F (36.5 C), temperature source Oral, resp. rate 18, height 5\' 7"  (1.702 m), weight 73.936 kg (163 lb), SpO2 96.00%.    Constitutional: He is oriented to person, place, and time. He appears well-developed and well-nourished. No distress.  HENT: No nasal discharge.  Head: Normocephalic and atraumatic.  Eyes: Pupils are equal and round.  No discharge.  Neck: Normal range of motion. Neck supple. No JVD present. No thyromegaly present.  Cardiovascular: Normal rate, regular rhythm, normal heart sounds. Exam reveals no gallop and no friction rub. No murmur heard.  Pulmonary/Chest: Effort normal and breath sounds normal. No stridor. No respiratory distress. He has no wheezes. He has no rales. He exhibits no tenderness.  Abdominal: Soft. Bowel sounds are normal. He exhibits no distension. There is no tenderness. There is no rebound and no guarding.  Musculoskeletal: Normal range of motion. He exhibits no edema and no tenderness.  Neurological: He is alert and oriented to person, place, and time. Coordination normal.  Skin: Skin is warm and dry. No rash noted. He is not diaphoretic. No erythema. No pallor.   Psychiatric: He has a normal mood and affect. His behavior is normal. Judgment and thought content normal.      Labs:   Lab Results  Component Value Date   WBC 6.7 10/28/2012   HGB 13.4 10/28/2012   HCT 38.9* 10/28/2012   MCV 92.0 10/28/2012   PLT 262 10/28/2012    Recent Labs Lab 10/28/12 1558  NA 140  K 4.0  CL 106  CO2 27  BUN 12  CREATININE 1.23  CALCIUM 9.4  GLUCOSE 83   Lab Results  Component Value Date   TROPONINI <0.30 09/04/2012    Lab Results  Component Value Date   CHOL 166 09/04/2012   Lab Results  Component Value Date   HDL 71 09/04/2012   Lab Results  Component Value Date   LDLCALC 77 09/04/2012   Lab Results  Component Value Date   TRIG 89 09/04/2012   Lab Results  Component Value Date   CHOLHDL 2.3 09/04/2012   No results found for this basename: LDLDIRECT      Radiology: No results found.    ASSESSMENT AND PLAN:  Atypical chest pain with abnormal stress test. Cardiac cath today. I answered all his questions.   Signed: Lorine Bears MD, [facility] 10/30/2012, 8:43 AM

## 2012-11-05 NOTE — Progress Notes (Signed)
Site area: right groin  Site Prior to Removal:  Level 0  Pressure Applied For 20 MINUTES    Minutes Beginning at 1345  Manual:   yes  Patient Status During Pull:  1350 Dropped blood pressure to 63/44 and complained of feeling "a little nausea", bolused with NS. Heart rate remained in sinus brady 40-50"s without change. Given zofran. 1355 bp 88/59  1400 bp 103/58. Nausea resolved. No further compliant. Received a total of 100 mls bolus then returned to 100 mls/hr.  Post Pull Groin Site:  Level 0  Post Pull Instructions Given:  yes  Post Pull Pulses Present:  yes  Dressing Applied:  yes  Comments:  Resting quietly at this time eating lunch. Wife at bedside.

## 2012-11-06 ENCOUNTER — Encounter (HOSPITAL_COMMUNITY): Payer: Self-pay | Admitting: Physician Assistant

## 2012-11-06 DIAGNOSIS — R001 Bradycardia, unspecified: Secondary | ICD-10-CM

## 2012-11-06 DIAGNOSIS — I2510 Atherosclerotic heart disease of native coronary artery without angina pectoris: Secondary | ICD-10-CM

## 2012-11-06 LAB — BASIC METABOLIC PANEL
BUN: 10 mg/dL (ref 6–23)
CO2: 25 mEq/L (ref 19–32)
Calcium: 8.9 mg/dL (ref 8.4–10.5)
Chloride: 106 mEq/L (ref 96–112)
Creatinine, Ser: 1.09 mg/dL (ref 0.50–1.35)
GFR calc Af Amer: 89 mL/min — ABNORMAL LOW (ref >90)
GFR calc non Af Amer: 77 mL/min — ABNORMAL LOW (ref >90)
Glucose, Bld: 98 mg/dL (ref 70–99)
Potassium: 4.2 mEq/L (ref 3.5–5.1)
Sodium: 139 mEq/L (ref 135–145)

## 2012-11-06 LAB — HEPATIC FUNCTION PANEL
ALT: 14 U/L (ref 0–53)
AST: 16 U/L (ref 0–37)
Albumin: 3.1 g/dL — ABNORMAL LOW (ref 3.5–5.2)
Alkaline Phosphatase: 42 U/L (ref 39–117)
Bilirubin, Direct: <0.1 mg/dL (ref 0.0–0.3)
Total Bilirubin: 0.1 mg/dL — ABNORMAL LOW (ref 0.3–1.2)
Total Protein: 6.3 g/dL (ref 6.0–8.3)

## 2012-11-06 LAB — CBC
HCT: 36.7 % — ABNORMAL LOW (ref 39.0–52.0)
Hemoglobin: 12.4 g/dL — ABNORMAL LOW (ref 13.0–17.0)
MCH: 31.7 pg (ref 26.0–34.0)
MCHC: 33.8 g/dL (ref 30.0–36.0)
MCV: 93.9 fL (ref 78.0–100.0)
Platelets: 220 10*3/uL (ref 150–400)
RBC: 3.91 MIL/uL — ABNORMAL LOW (ref 4.22–5.81)
RDW: 12.7 % (ref 11.5–15.5)
WBC: 7.2 10*3/uL (ref 4.0–10.5)

## 2012-11-06 MED ORDER — SODIUM CHLORIDE 0.9 % IV BOLUS (SEPSIS)
250.0000 mL | Freq: Once | INTRAVENOUS | Status: AC
  Administered 2012-11-06: 250 mL via INTRAVENOUS

## 2012-11-06 MED ORDER — ATORVASTATIN CALCIUM 40 MG PO TABS: 40.0000 mg | ORAL_TABLET | Freq: Every evening | ORAL | Status: DC

## 2012-11-06 MED ORDER — METOPROLOL TARTRATE 50 MG PO TABS: 25.0000 mg | ORAL_TABLET | Freq: Two times a day (BID) | ORAL | Status: DC

## 2012-11-06 MED ORDER — ASPIRIN 81 MG PO TABS: 81.0000 mg | ORAL_TABLET | Freq: Every day | ORAL | Status: DC

## 2012-11-06 MED FILL — Sodium Chloride IV Soln 0.9%: INTRAVENOUS | Qty: 50 | Status: AC

## 2012-11-06 NOTE — Progress Notes (Signed)
CARDIAC REHAB PHASE I   PRE:  Rate/Rhythm: 72SR  BP:  Supine: 146/98  Sitting:   Standing:    SaO2:   MODE:  Ambulation: 950 ft   POST:  Rate/Rhythm: 95SR  BP:  Supine:   Sitting: 160/84  Standing:    SaO2:  6213-0865 Pt walked 950 ft with steady gait. No CP. Tolerated well. Education completed with pt and wife. Understanding voiced. Pt is cutting down alcohol use. Congratulated pt in what he has accomplished. Declined CRP 2 due to work schedule and wants to ex on his own. Has treadmill.   Luetta Nutting, RN BSN  11/06/2012 9:32 AM

## 2012-11-06 NOTE — Discharge Summary (Signed)
Discharge Summary   Patient ID: [name] MRN: [id number], DOB/AGE: 06/28/1960 52 y.o. Admit date: 11/05/2012 D/C date:     11/06/2012  Primary Cardiologist: Purvis Sheffield - this admission the patient states he typically likes to f/u in [place] office  Primary Discharge Diagnoses:  1. CAD s/p LAD atherectomy/DES placement 2. HTN 3. Hyperlipidemia 4. Bradycardia - BB reduced this adm  Secondary Discharge Diagnoses:  1. Chronic back pain 2. Anxiety  Hospital Course: [name] is a 52 y/o M with history of HTN, HL who presented to [facility] 11/05/2012 for planned PCI. He had recently had a hospitalization at [facility] for chest pain. He ruled out for an ACS and had an echo showing LV systolic function, EF 60-65%, with no regional wall motion abnormalities and grade I diastolic dysfunction. Upon discharge, he had a nuclear myocardial perfusion study which was abnormal with evidence of moderate sized partially reversible inferior wall defect. He underwent diagnostic cath on 10/30/12 demonstrating 1. Severe heavily calcified proximal LAD stenosis at the origin of a large diagonal branch with moderate disease in the right coronary artery.  2. Low normal LV systolic function and mildly elevated left ventricular end-diastolic pressure Dr. Kirke Corin was considering PCI vs CABG and ultimately PCI was chosen. Plavix was started. He was brought back for planned PCI yesterday and underwent successful rotational atherectomy and drug-eluting stent placement to the proximal LAD. He was noted to be bradycardic and thus metoprolol dose was decreased, which improved. He was started on statin. DAPT with ASA/Plavix for at least 12 months is recommended. He did have an episode of dropped pressure a/w nausea during sheath pull with resolved with NS bolus. Pressure gradually came back up and remained stable. P2Y12 was acceptable at 179. Dr. Kirke Corin has seen and examined the patient today and feels he is stable for  discharge.  Consider outpatient f/u labs in 4-6 weeks given statin initiation. The patient was also instructed to monitor BP outside the hospital given fluctuating pressures this admission and change in Lopressor.  Discharge Vitals: Blood pressure 160/84, pulse 78, temperature 97.5 F (36.4 C), temperature source Oral, resp. rate 18, height 5\' 7"  (1.702 m), weight 163 lb 5.8 oz (74.1 kg), SpO2 96.00%.  Labs: Lab Results  Component Value Date   WBC 7.2 11/06/2012   HGB 12.4* 11/06/2012   HCT 36.7* 11/06/2012   MCV 93.9 11/06/2012   PLT 220 11/06/2012     Recent Labs Lab 11/06/12 0406  NA 139  K 4.2  CL 106  CO2 25  BUN 10  CREATININE 1.09  CALCIUM 8.9  GLUCOSE 98    Lab Results  Component Value Date   CHOL 166 09/04/2012   HDL 71 09/04/2012   LDLCALC 77 09/04/2012   TRIG 89 09/04/2012    Diagnostic Studies/Procedures   Cardiac catheterization this admission, please see full report and above for summary.  Discharge Medications     Medication List         aspirin 81 MG tablet  Take 1 tablet (81 mg total) by mouth daily.     atorvastatin 40 MG tablet  Commonly known as:  LIPITOR  Take 1 tablet (40 mg total) by mouth every evening.     clopidogrel 75 MG tablet  Commonly known as:  PLAVIX  Take 75 mg by mouth daily.     HYDROcodone-acetaminophen 10-325 MG per tablet  Commonly known as:  NORCO  Take 1 tablet by mouth every 6 (six) hours  as needed for pain.     lisinopril 20 MG tablet  Commonly known as:  PRINIVIL,ZESTRIL  Take 20 mg by mouth 2 (two) times daily.     metoprolol 50 MG tablet  Commonly known as:  LOPRESSOR  Take 0.5 tablets (25 mg total) by mouth 2 (two) times daily.     nitroGLYCERIN 0.4 MG SL tablet  Commonly known as:  NITROSTAT  Place 1 tablet (0.4 mg total) under the tongue every 5 (five) minutes as needed for chest pain (CP or SOB).     tamsulosin 0.4 MG Caps capsule  Commonly known as:  FLOMAX  Take 0.4 mg by mouth daily.         Disposition   The patient will be discharged in stable condition to home. Discharge Orders   Future Appointments Provider Department Dept Phone   11/15/2012 10:40 AM Laqueta Linden, MD [facility] [phone]   Future Orders Complete By Expires   Diet - low sodium heart healthy  As directed    Discharge instructions  As directed    Comments:     Please monitor your blood pressure occasionally at home. Call your doctor if you tend to get readings of greater than 130 on the top number or 80 on the bottom number (or any low readings as well).  The plan is for you to take aspirin and Plavix for 12 months. Please let us know when you are due for refills.   Increase activity slowly  As directed    Comments:     No driving for 2 days. No lifting over 5 lbs for 1 week. No sexual activity for 1 week. You may return to work on 11/13/12. Keep procedure site clean & dry. If you notice increased pain, swelling, bleeding or pus, call/return!  You may shower, but no soaking baths/hot tubs/pools for 1 week.     Follow-up Information   Follow up with Laqueta Linden, MD. [facility] office - 11/15/12 at 10:40am)    Specialty:  Cardiology   Contact information:   [address] [place] [address] [phone]         Duration of Discharge Encounter: Greater than 30 minutes including physician and PA time.  Signed, Mansour Balboa PA-C 11/06/2012, 11:11 AM

## 2012-11-06 NOTE — Progress Notes (Signed)
   SUBJECTIVE: no chest pain or dyspnea. Bradycardia improved after decreasing Metoprolol.    Filed Vitals:   11/06/12 0036 11/06/12 0100 11/06/12 0505 11/06/12 0824  BP: 90/59 80/48 115/65 146/98  Pulse: 56 54 56 78  Temp:   97.3 F (36.3 C) 97.5 F (36.4 C)  TempSrc:   Oral Oral  Resp:   16 18  Height:      Weight:   74.1 kg (163 lb 5.8 oz)   SpO2: 96% 97% 96% 96%    Intake/Output Summary (Last 24 hours) at 11/06/12 0849 Last data filed at 11/06/12 0215  Gross per 24 hour  Intake    610 ml  Output   1200 ml  Net   -590 ml    LABS: Basic Metabolic Panel:  Recent Labs  16/10/96 0724 11/06/12 0406  NA 132* 139  K 3.7 4.2  CL 97 106  CO2 25 25  GLUCOSE 95 98  BUN 11 10  CREATININE 1.19 1.09  CALCIUM 9.0 8.9   Liver Function Tests: No results found for this basename: AST, ALT, ALKPHOS, BILITOT, PROT, ALBUMIN,  in the last 72 hours No results found for this basename: LIPASE, AMYLASE,  in the last 72 hours CBC:  Recent Labs  11/05/12 0724 11/06/12 0406  WBC 7.2 7.2  HGB 12.8* 12.4*  HCT 37.3* 36.7*  MCV 91.9 93.9  PLT 222 220   Cardiac Enzymes: No results found for this basename: CKTOTAL, CKMB, CKMBINDEX, TROPONINI,  in the last 72 hours BNP: No components found with this basename: POCBNP,  D-Dimer: No results found for this basename: DDIMER,  in the last 72 hours Hemoglobin A1C: No results found for this basename: HGBA1C,  in the last 72 hours Fasting Lipid Panel: No results found for this basename: CHOL, HDL, LDLCALC, TRIG, CHOLHDL, LDLDIRECT,  in the last 72 hours Thyroid Function Tests: No results found for this basename: TSH, T4TOTAL, FREET3, T3FREE, THYROIDAB,  in the last 72 hours Anemia Panel: No results found for this basename: VITAMINB12, FOLATE, FERRITIN, TIBC, IRON, RETICCTPCT,  in the last 72 hours   PHYSICAL EXAM General: Well developed, well nourished, in no acute distress HEENT:  Normocephalic and atramatic Neck:  No JVD.  Lungs:  Clear bilaterally to auscultation and percussion. Heart: HRRR . Normal S1 and S2 without gallops or murmurs.  Abdomen: Bowel sounds are positive, abdomen soft and non-tender  Msk:  Back normal, normal gait. Normal strength and tone for age. Extremities: No clubbing, cyanosis or edema.   Neuro: Alert and oriented X 3. Psych:  Good affect, responds appropriately No groin hematoma.   TELEMETRY: Reviewed telemetry pt in [facility].   ASSESSMENT AND PLAN:  1. CAD: s/p LAD atherectomy and DES palcement. Continue dual antiplatelet therapy for at least 12 months.  Refer to cardiac rehab.  His job is very physical. Recommend return to work on 11/13/12.   2. Hypertension: Metoprolol was decreased to 25 mg bid due to bradycardia.   3. Hyperlipidemia: I added Atorvastatin 40 mg daily. Recommend fasting lipid and liver profile in 4-6 weeks.   DC home with follow up in [place] with KO.    Lorine Bears, MD, [facility] 11/06/2012 8:49 AM

## 2012-11-15 ENCOUNTER — Ambulatory Visit (INDEPENDENT_AMBULATORY_CARE_PROVIDER_SITE_OTHER): Payer: BC Managed Care – PPO | Admitting: Cardiovascular Disease

## 2012-11-15 VITALS — BP 126/78 | HR 58 | Ht 72.0 in | Wt 176.0 lb

## 2012-11-15 DIAGNOSIS — R079 Chest pain, unspecified: Secondary | ICD-10-CM

## 2012-11-15 DIAGNOSIS — I10 Essential (primary) hypertension: Secondary | ICD-10-CM

## 2012-11-15 DIAGNOSIS — Z955 Presence of coronary angioplasty implant and graft: Secondary | ICD-10-CM

## 2012-11-15 DIAGNOSIS — E785 Hyperlipidemia, unspecified: Secondary | ICD-10-CM

## 2012-11-15 DIAGNOSIS — I2510 Atherosclerotic heart disease of native coronary artery without angina pectoris: Secondary | ICD-10-CM

## 2012-11-15 DIAGNOSIS — Z9861 Coronary angioplasty status: Secondary | ICD-10-CM

## 2012-11-15 MED ORDER — METOPROLOL TARTRATE 50 MG PO TABS: 25.0000 mg | ORAL_TABLET | Freq: Two times a day (BID) | ORAL | Status: DC

## 2012-11-15 NOTE — Patient Instructions (Addendum)
   Decrease Metoprolol to 25mg  twice a day  (1/2 tab of your 50mg  tablet)  Contact office when needing refill on above Continue all other medications.   Your physician wants you to follow up in: 6 months.  You will receive a reminder letter in the mail one-two months in advance.  If you don't receive a letter, please call our office to schedule the follow up appointment

## 2012-11-15 NOTE — Progress Notes (Signed)
Patient ID: [name], male   DOB: Feb 22, 1960, 52 y.o.   MRN: [id number]      SUBJECTIVE: The patient underwent successful rotational atherectomy and drug-eluting stent placement to the proximal LAD on 11/05/12. A P2Y12 was acceptable at 179. He is now on ASA, Plavix, metoprolol, lisinopril, and Lipitor.  He denies chest pain and shortness of breath, as well as palpitations and leg swelling. He has yet to begin cardiac rehabilitation.  He has continued to take metoprolol 50 mg bid, albeit it was reduced to 25 mg bid during his hospitalization due to bradycardia.     No Known Allergies  Current Outpatient Prescriptions  Medication Sig Dispense Refill  . aspirin 81 MG tablet Take 1 tablet (81 mg total) by mouth daily.      Marland Kitchen atorvastatin (LIPITOR) 40 MG tablet Take 1 tablet (40 mg total) by mouth every evening.  30 tablet  6  . clopidogrel (PLAVIX) 75 MG tablet Take 75 mg by mouth daily.      Marland Kitchen HYDROcodone-acetaminophen (NORCO) 10-325 MG per tablet Take 1 tablet by mouth every 6 (six) hours as needed for pain.      Marland Kitchen lisinopril (PRINIVIL,ZESTRIL) 20 MG tablet Take 20 mg by mouth 2 (two) times daily.      . metoprolol (LOPRESSOR) 50 MG tablet Take 0.5 tablets (25 mg total) by mouth 2 (two) times daily.      . nitroGLYCERIN (NITROSTAT) 0.4 MG SL tablet Place 1 tablet (0.4 mg total) under the tongue every 5 (five) minutes as needed for chest pain (CP or SOB).  30 tablet  1  . tamsulosin (FLOMAX) 0.4 MG CAPS capsule Take 0.4 mg by mouth daily.       No current facility-administered medications for this visit.    Past Medical History  Diagnosis Date  . Hypertension   . Anxiety   . Hypercholesteremia   . Coronary artery disease     a. Diag cath 10/2012 for CP/abnormal nuc -> planned PCI s/p LAD atherectomy/DES placement 11/05/12.  Marland Kitchen GERD (gastroesophageal reflux disease)   . Chronic lower back pain     "I work Holiday representative; messed back up ~ 30 yr ago; paralyzed for 2 days then"  (11/05/2012)  . Bradycardia     a. During 11/2012 adm: lopressor decreased.    Past Surgical History  Procedure Laterality Date  . Hemorrhoid surgery  ~ 2010  . Cardiac catheterization  10/29/2012  . Coronary angioplasty with stent placement  11/05/2012    History   Social History  . Marital Status: Married    Spouse Name: N/A    Number of Children: N/A  . Years of Education: N/A   Occupational History  . Not on file.   Social History Main Topics  . Smoking status: Former Smoker -- 3.00 packs/day for 32 years    Types: Cigarettes    Quit date: 11/29/2006  . Smokeless tobacco: Former Neurosurgeon    Types: Chew     Comment: 11/05/2012 "chewed tobacco when I play ball; aien't chewed since age 43"  . Alcohol Use: Yes     Comment: 11/05/2012 "drink nonalcolholic beer only now; since ~ 3-4 weeks  ago; before that it was 8-10 beers/night"  . Drug Use: No  . Sexual Activity: Not Currently   Other Topics Concern  . Not on file   Social History Narrative  . No narrative on file     There were no vitals filed for this visit.  PHYSICAL  EXAM General: NAD Neck: No JVD, no thyromegaly or thyroid nodule.  Lungs: Clear to auscultation bilaterally with normal respiratory effort. CV: Nondisplaced PMI.  Heart regular S1/S2, no S3/S4, no murmur.  No peripheral edema.  No carotid bruit.  Normal pedal pulses.  Abdomen: Soft, nontender, no hepatosplenomegaly, no distention.  Neurologic: Alert and oriented x 3.  Psych: Normal affect. Extremities: No clubbing or cyanosis.   ECG: reviewed and available in electronic records.   Hemodynamics:  AO: 14377 mmHg  LV: 14311 mmHg  LVEDP: 19 mmHg  Coronary angiography:  Coronary dominance: right  Left Main: Mildly calcified with no significant disease.  Left Anterior Descending (LAD): Severely calcified proximally with 80-90% heavily calcified eccentric lesion and proximally at the origin of the large diagonal branch. There is a 40% mid stenosis.  There is of the vessel has minor irregularities.  1st diagonal (D1): Large in size with 40% ostial stenosis.  2nd diagonal (D2): Very small in size.  3rd diagonal (D3): Very small in size.  Circumflex (LCx): Normal in size and moderately calcified proximally. There is diffuse 40% disease in the midsegment.  1st obtuse marginal: Normal in size with no significant disease.  2nd obtuse marginal: Normal in size with no significant disease.  3rd obtuse marginal: Small in size with minor irregularities.  Right Coronary Artery: Medium in size and significantly calcified proximally. 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 There is a 40% disease in the mid RCA.  Posterior descending artery: This is mostly supplied by a large RV branch.  Posterior AV segment: Small in size with minor irregularities.  Posterolateral branchs: Small in size branches. Left ventriculography: Left ventricular systolic function is low normal , LVEF is estimated at 50 %, there is no significant mitral regurgitation . Mild anterior wall hypokinesis.  Final Conclusions:  1. Severe heavily calcified proximal LAD stenosis at the origin of a large diagonal branch with moderate disease in the right coronary artery.  2. Low normal LV systolic function and mildly elevated left ventricular end-diastolic pressure.      ASSESSMENT AND PLAN: 1. CAD: s/p LAD atherectomy and DES placement on 11/05/12. Continue dual antiplatelet therapy for at least 12 months. No bleeding problems with ASA and Plavix. Will assist with referral to cardiac rehab.  His job is very physical. Recommend return to work on 11/13/12. 2. Hypertension: metoprolol decreased to 25 mg bid due to bradycardia. Controlled on lisinopril. 3. Hyperlipidemia: now on atorvastatin 40 mg daily. I will repeat a fasting lipid and liver profile within the next several weeks.    Prentice Docker, M.D., F.A.C.C.

## 2013-02-20 ENCOUNTER — Ambulatory Visit (INDEPENDENT_AMBULATORY_CARE_PROVIDER_SITE_OTHER): Payer: BC Managed Care – PPO

## 2013-02-20 ENCOUNTER — Encounter: Payer: Self-pay | Admitting: Orthopedic Surgery

## 2013-02-20 ENCOUNTER — Ambulatory Visit (INDEPENDENT_AMBULATORY_CARE_PROVIDER_SITE_OTHER): Payer: BC Managed Care – PPO | Admitting: Orthopedic Surgery

## 2013-02-20 VITALS — BP 131/69 | Ht 72.0 in | Wt 181.0 lb

## 2013-02-20 DIAGNOSIS — M25511 Pain in right shoulder: Secondary | ICD-10-CM

## 2013-02-20 DIAGNOSIS — M719 Bursopathy, unspecified: Secondary | ICD-10-CM

## 2013-02-20 DIAGNOSIS — M67919 Unspecified disorder of synovium and tendon, unspecified shoulder: Secondary | ICD-10-CM

## 2013-02-20 DIAGNOSIS — M25519 Pain in unspecified shoulder: Secondary | ICD-10-CM

## 2013-02-20 DIAGNOSIS — M75101 Unspecified rotator cuff tear or rupture of right shoulder, not specified as traumatic: Secondary | ICD-10-CM | POA: Insufficient documentation

## 2013-02-20 NOTE — Patient Instructions (Addendum)
Start therapy   You have received a steroid shot. 15% of patients experience increased pain at the injection site with in the next 24 hours. This is best treated with ice and tylenol extra strength 2 tabs every 8 hours. If you are still having pain please call the office.  Rotator Cuff Tendinitis  Rotator cuff tendinitis is inflammation of the tough, cord-like bands that connect muscle to bone (tendons) in your rotator cuff. Your rotator cuff is the collection of all the muscles and tendons that connect your arm to your shoulder. Your rotator cuff holds the head of your upper arm bone (humerus) in the cup (fossa) of your shoulder blade (scapula). CAUSES Rotator cuff tendinitis is usually caused by overusing the joint involved.  SIGNS AND SYMPTOMS  Deep ache in the shoulder also felt on the outside upper arm over the shoulder muscle.  Point tenderness over the area that is injured.  Pain comes on gradually and becomes worse with lifting the arm to the side (abduction) or turning it inward (internal rotation).  May lead to a chronic tear: When a rotator cuff tendon becomes inflamed, it runs the risk of losing its blood supply, causing some tendon fibers to die. This increases the risk that the tendon can fray and partially or completely tear. DIAGNOSIS Rotator cuff tendinitis is diagnosed by taking a medical history, performing a physical exam, and reviewing results of imaging exams. The medical history is useful to help determine the type of rotator cuff injury. The physical exam will include looking at the injured shoulder, feeling the injured area, and watching you do range-of-motion exercises. X-ray exams are typically done to rule out other causes of shoulder pain, such as fractures. MRI is the imaging exam usually used for significant shoulder injuries. Sometimes a dye study called CT arthrogram is done, but it is not as widely used as MRI. In some institutions, special ultrasound tests may also  be used to aid in the diagnosis. TREATMENT  Less Severe Cases  Use of a sling to rest the shoulder for a short period of time. Prolonged use of the sling can cause stiffness, weakness, and loss of motion of the shoulder joint.  Anti-inflammatory medicines, such as ibuprofen or naproxen sodium, may be prescribed. More Severe Cases  Physical therapy.  Use of steroid injections into the shoulder joint.  Surgery. HOME CARE INSTRUCTIONS   Use a sling or splint until the pain decreases. Prolonged use of the sling can cause stiffness, weakness, and loss of motion of the shoulder joint.  Apply ice to the injured area:  Put ice in a plastic bag.  Place a towel between your skin and the bag.  Leave the ice on for 20 minutes, 2 3 times a day.  Try to avoid use other than gentle range of motion while your shoulder is painful. Use the shoulder and exercise only as directed by your health care provider. Stop exercises or range of motion if pain or discomfort increases, unless directed otherwise by your health care provider.  Only take over-the-counter or prescription medicines for pain, discomfort, or fever as directed by your health care provider.  If you were given a shoulder sling and straps (immobilizer), do not remove it except as directed, or until you see a health care provider for a follow-up exam. If you need to remove it, move your arm as little as possible or as directed.  You may want to sleep on several pillows at night to lessen swelling  and pain. SEEK IMMEDIATE MEDICAL CARE IF:   Your shoulder pain increases or new pain develops in your arm, hand, or fingers and is not relieved with medicines.  You have new, unexplained symptoms, especially increased numbness in the hands or loss of strength.  You develop any worsening of the problems that brought you in for care.  Your arm, hand, or fingers are numb or tingling.  Your arm, hand, or fingers are swollen, painful, or turn  white or blue. MAKE SURE YOU:  Understand these instructions.  Will watch your condition.  Will get help right away if you are not doing well or get worse. Document Released: 03/11/2003 Document Revised: 10/09/2012 Document Reviewed: 07/31/2012 [facility] Patient Information 2014 [place], [place].

## 2013-02-20 NOTE — Progress Notes (Signed)
Patient ID: [name], male   DOB: August 24, 1960, 53 y.o.   MRN: [id number]  Chief Complaint  Patient presents with  . Shoulder Pain    Right Shoulder Pain, no injury; referral from [place]: 53 year old male presents with right shoulder pain starting 3 months ago with gradual onset. Denies trauma. No previous treatment. Pain is described as throbbing. Pain is 10 out of 10. Timing comes and goes. The patient has gone to pain medication for his back which helps his shoulder and is worse when he uses it. Associated symptoms numbness locking and catching he says it feels like it goes out of joint. He denies history dislocation  Review of systems easy bleeding and bruising numbness and tingling  He had a stent placed 4 months ago  The past, family history and social history have been reviewed and are recorded in the corresponding sections of epic   Vital signs:   General the patient is well-developed and well-nourished grooming and hygiene are normal Oriented x3 Mood and affect normal Ambulation normal  Inspection of the cervical spine shows no tenderness and normal range of motion  Right shoulder there is tenderness around the peri-acromial region and also supraspinatus fossa trapezius. Full passive range of motion is noted in the right shoulder with no instability. Cuff strength is intact. Skin is clean dry and intact.  Impingement sign is positive  Sensory exam is normal pulses are intact and he has no lymphadenopathy in the axilla or cervical region  X-rays are unremarkable he does have increased bony sclerosis under the acromion and also at the greater tuberosity  The patient has not been treated so we are going to institute physical therapy and subacromial injection he is already taking Norco 10 mg for pain  If this therapy fails to make improvement for him we would recommend MRI of the shoulder  Diagnosis rotator cuff syndrome

## 2013-05-12 ENCOUNTER — Ambulatory Visit: Payer: BC Managed Care – PPO | Admitting: Cardiovascular Disease

## 2013-05-30 ENCOUNTER — Ambulatory Visit: Payer: BC Managed Care – PPO | Admitting: Cardiovascular Disease

## 2013-06-01 ENCOUNTER — Other Ambulatory Visit: Payer: Self-pay | Admitting: Cardiovascular Disease

## 2013-06-06 ENCOUNTER — Other Ambulatory Visit: Payer: Self-pay | Admitting: Cardiovascular Disease

## 2013-06-12 ENCOUNTER — Encounter: Payer: Self-pay | Admitting: Cardiovascular Disease

## 2013-06-12 ENCOUNTER — Encounter: Payer: Self-pay | Admitting: *Deleted

## 2013-06-12 ENCOUNTER — Ambulatory Visit (INDEPENDENT_AMBULATORY_CARE_PROVIDER_SITE_OTHER): Payer: BC Managed Care – PPO | Admitting: Cardiovascular Disease

## 2013-06-12 VITALS — BP 156/91 | HR 64 | Ht 72.0 in | Wt 181.0 lb

## 2013-06-12 DIAGNOSIS — I10 Essential (primary) hypertension: Secondary | ICD-10-CM

## 2013-06-12 DIAGNOSIS — I2510 Atherosclerotic heart disease of native coronary artery without angina pectoris: Secondary | ICD-10-CM

## 2013-06-12 DIAGNOSIS — E785 Hyperlipidemia, unspecified: Secondary | ICD-10-CM

## 2013-06-12 MED ORDER — ATORVASTATIN CALCIUM 40 MG PO TABS
40.0000 mg | ORAL_TABLET | Freq: Every evening | ORAL | Status: DC
Start: 2013-06-12 — End: 2013-12-08

## 2013-06-12 NOTE — Patient Instructions (Signed)
   Labs for FLP, LFT - Reminder:  Nothing to eat or drink after 12 midnight prior to labs. Office will contact with results via phone or letter.   Your physician wants you to follow up in: 6 months.  You will receive a reminder letter in the mail one-two months in advance.  If you don't receive a letter, please call our office to schedule the follow up appointment

## 2013-06-12 NOTE — Progress Notes (Signed)
Patient ID: [name], male   DOB: December 11, 1960, 53 y.o.   MRN: [id number]      SUBJECTIVE: The patient is here to followup for coronary artery disease, hypertension, and hyperlipidemia. He denies chest pain and shortness of breath. He has been bothered by seasonal allergies with rhinorrhea and watery eyes. His blood pressure was noted to be elevated in the office today but he tells me that he just thought he lost his debit card and felt like he had an anxiety attack. 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    No Known Allergies  Current Outpatient Prescriptions  Medication Sig Dispense Refill  . aspirin 81 MG tablet Take 1 tablet (81 mg total) by mouth daily.      Marland Kitchen atorvastatin (LIPITOR) 40 MG tablet Take 1 tablet (40 mg total) by mouth every evening.  30 tablet  6  . clopidogrel (PLAVIX) 75 MG tablet TAKE 1 TABLET BY MOUTH EVERY DAY  30 tablet  3  . HYDROcodone-acetaminophen (NORCO) 10-325 MG per tablet Take 1 tablet by mouth every 6 (six) hours as needed for pain.      Marland Kitchen lisinopril (PRINIVIL,ZESTRIL) 20 MG tablet Take 20 mg by mouth 2 (two) times daily.      . metoprolol (LOPRESSOR) 50 MG tablet Take 0.5 tablets (25 mg total) by mouth 2 (two) times daily.      . nitroGLYCERIN (NITROSTAT) 0.4 MG SL tablet Place 1 tablet (0.4 mg total) under the tongue every 5 (five) minutes as needed for chest pain (CP or SOB).  30 tablet  1   No current facility-administered medications for this visit.    Past Medical History  Diagnosis Date  . Hypertension   . Anxiety   . Hypercholesteremia   . Coronary artery disease     a. Diag cath 10/2012 for CP/abnormal nuc -> planned PCI s/p LAD atherectomy/DES placement 11/05/12.  Marland Kitchen GERD (gastroesophageal reflux disease)   . 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 Bradycardia     a. During 11/2012 adm:  lopressor decreased.    Past Surgical History  Procedure Laterality Date  . Hemorrhoid surgery  ~ 2010  . Cardiac catheterization  10/29/2012  . Coronary angioplasty with stent placement  11/05/2012    History   Social History  . Marital Status: Married    Spouse Name: N/A    Number of Children: N/A  . Years of Education: N/A   Occupational History  . Not on file.   Social History Main Topics  . Smoking status: Former Smoker -- 3.00 packs/day for 32 years    Types: Cigarettes    Quit date: 11/29/2006  . Smokeless tobacco: Former Systems developer    Types: Chew     Comment: 11/05/2012 "chewed tobacco when I play ball; aien't chewed since age 89"  . Alcohol Use: Yes     Comment: 11/05/2012 "drink nonalcolholic beer only now; since ~ 3-4 weeks  ago; before that it was 8-10 beers/night"  . Drug Use: No  . Sexual Activity: Not Currently   Other Topics Concern  . Not on file   Social History Narrative  . No narrative on file     Filed Vitals:   06/12/13 1603  BP: 156/91  Pulse: 64  Height: 6' (1.829 m)  Weight: 181 lb (  82.101 kg)    PHYSICAL EXAM General: NAD Neck: No JVD, no thyromegaly. Lungs: Clear to auscultation bilaterally with normal respiratory effort. CV: Nondisplaced PMI.  Regular rate and rhythm, normal S1/S2, no S3/S4, no murmur. No pretibial or periankle edema.  No carotid bruit.  Normal pedal pulses.  Abdomen: Soft, nontender, no hepatosplenomegaly, no distention.  Neurologic: Alert and oriented x 3.  Psych: Normal affect. Extremities: No clubbing or cyanosis.   ECG: reviewed and available in electronic records.      ASSESSMENT AND PLAN: 1. CAD s/p LAD atherectomy and DES placement on 11/05/12: Symptomatically stable. Continue dual antiplatelet therapy for at least 12 months. No bleeding problems with ASA and Plavix. 2. Hypertension: Elevated today for reasons noted above. No changes to therapy. 3. Hyperlipidemia: Will refill atorvastatin 40 mg daily. I  will repeat a fasting lipid and liver profile.  Dispo: f/u 6 months.  Kate Sable, M.D., F.A.C.C.

## 2013-08-18 ENCOUNTER — Other Ambulatory Visit: Payer: Self-pay | Admitting: Cardiovascular Disease

## 2013-08-18 LAB — HEPATIC FUNCTION PANEL
ALT: 16 U/L (ref 0–53)
AST: 22 U/L (ref 0–37)
Albumin: 4.4 g/dL (ref 3.5–5.2)
Alkaline Phosphatase: 52 U/L (ref 39–117)
Bilirubin, Direct: 0.2 mg/dL (ref 0.0–0.3)
Indirect Bilirubin: 0.5 mg/dL (ref 0.2–1.2)
Total Bilirubin: 0.7 mg/dL (ref 0.2–1.2)
Total Protein: 7.6 g/dL (ref 6.0–8.3)

## 2013-08-18 LAB — LIPID PANEL
Cholesterol: 156 mg/dL (ref 0–200)
HDL: 68 mg/dL (ref >39)
LDL Cholesterol: 65 mg/dL (ref 0–99)
Total CHOL/HDL Ratio: 2.3 Ratio
Triglycerides: 114 mg/dL (ref <150)
VLDL: 23 mg/dL (ref 0–40)

## 2013-08-19 ENCOUNTER — Telehealth: Payer: Self-pay | Admitting: *Deleted

## 2013-08-19 NOTE — Telephone Encounter (Signed)
Relayed info to wife.

## 2013-08-28 ENCOUNTER — Ambulatory Visit (INDEPENDENT_AMBULATORY_CARE_PROVIDER_SITE_OTHER): Payer: Self-pay | Admitting: Emergency Medicine

## 2013-08-28 VITALS — BP 156/100 | HR 65 | Temp 98.2°F | Resp 16 | Ht 70.0 in | Wt 179.0 lb

## 2013-08-28 DIAGNOSIS — Z111 Encounter for screening for respiratory tuberculosis: Secondary | ICD-10-CM

## 2013-08-28 NOTE — Progress Notes (Signed)
  Tuberculosis Risk Questionnaire  1. No Were you born outside the [place] in one of the following parts of the world: [place], [place], [place], [place] or [place]?    2. No Have you traveled outside the [place] and lived for more than one month in one of the following parts of the world: [place], [place], [place], [place] or [place]?    3. No Do you have a compromised immune system such as from any of the following conditions:HIV/AIDS, organ or bone marrow transplantation, diabetes, immunosuppressive medicines (e.g. Prednisone, Remicaide), leukemia, lymphoma, cancer of the head or neck, gastrectomy or jejunal bypass, end-stage renal disease (on dialysis), or silicosis?     4. Yes Have you ever or do you plan on working in: a residential care center, a health care facility, a jail or prison or homeless shelter?    5. No Have you ever: injected illegal drugs, used crack cocaine, lived in a homeless shelter  or been in jail or prison?     6. No Have you ever been exposed to anyone with infectious tuberculosis?    Tuberculosis Symptom Questionnaire  Do you currently have any of the following symptoms?  1. No Unexplained cough lasting more than 3 weeks?   2. No Unexplained fever lasting more than 3 weeks.   3. No Night Sweats (sweating that leaves the bedclothes and sheets wet)     4. No Shortness of Breath   5. No Chest Pain   6. No Unintentional weight loss    7. No Unexplained fatigue (very tired for no reason)

## 2013-08-28 NOTE — Progress Notes (Signed)
[facility] [address], [place] [address] [phone]  Date:  08/28/2013   Name:  [name]   DOB:  [birth year]   MRN:  [id number]  PCP:  Leonides Grills, MD    Chief Complaint: PPD Placement   History of Present Illness:  [name] is a 53 y.o. very pleasant male patient who presents with the following:  preemployment physical.   History of CAD and HBP on meds   Patient Active Problem List   Diagnosis Date Noted  . Rotator cuff syndrome of right shoulder 02/20/2013  . Bradycardia 11/06/2012  . Coronary artery disease   . Abnormal finding on cardiovascular stress test 09/25/2012  . Hypertension 09/03/2012  . Chest pain 09/03/2012  . Alcohol abuse, daily use 09/03/2012  . Hyperlipidemia 09/03/2012  . Hyponatremia 09/03/2012    Past Medical History  Diagnosis Date  . Hypertension   . Anxiety   . Hypercholesteremia   . Coronary artery disease     a. Diag cath 10/2012 for CP/abnormal nuc -> planned PCI s/p LAD atherectomy/DES placement 11/05/12.  Marland Kitchen GERD (gastroesophageal reflux disease)   . 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 Bradycardia     a. During 11/2012 adm: lopressor decreased.    Past Surgical History  Procedure Laterality Date  . Hemorrhoid surgery  ~ 2010  . Cardiac catheterization  10/29/2012  . Coronary angioplasty with stent placement  11/05/2012    History  Substance Use Topics  . Smoking status: Former Smoker -- 3.00 packs/day for 32 years    Types: Cigarettes    Quit date: 11/29/2006  . Smokeless tobacco: Former Systems developer    Types: Chew     Comment: 11/05/2012 "chewed tobacco when I play ball; aien't chewed since age 83"  . Alcohol Use: Yes     Comment: 11/05/2012 "drink nonalcolholic beer only now; since ~ 3-4 weeks  ago; before that it was 8-10 beers/night"    Family History  Problem Relation Age of Onset  . Heart disease  Father   . Heart disease Sister     No Known Allergies  Medication list has been reviewed and updated.  Current Outpatient Prescriptions on File Prior to Visit  Medication Sig Dispense Refill  . aspirin 81 MG tablet Take 1 tablet (81 mg total) by mouth daily.      Marland Kitchen atorvastatin (LIPITOR) 40 MG tablet Take 1 tablet (40 mg total) by mouth every evening.  90 tablet  3  . clopidogrel (PLAVIX) 75 MG tablet TAKE 1 TABLET BY MOUTH EVERY DAY  30 tablet  3  . HYDROcodone-acetaminophen (NORCO) 10-325 MG per tablet Take 1 tablet by mouth every 6 (six) hours as needed for pain.      Marland Kitchen lisinopril (PRINIVIL,ZESTRIL) 20 MG tablet Take 20 mg by mouth 2 (two) times daily.      . metoprolol (LOPRESSOR) 50 MG tablet Take 0.5 tablets (25 mg total) by mouth 2 (two) times daily.      . nitroGLYCERIN (NITROSTAT) 0.4 MG SL tablet Place 1 tablet (0.4 mg total) under the tongue every 5 (five) minutes as needed for chest pain (CP or SOB).  30 tablet  1   No current facility-administered medications on file prior to visit.    Review of Systems:  As per HPI, otherwise negative.    Physical Examination: Filed Vitals:  08/28/13 1240  BP: 156/100  Pulse: 65  Temp: 98.2 F (36.8 C)  Resp: 16   Filed Vitals:   08/28/13 1240  Height: 5\' 10"  (1.778 m)  Weight: 179 lb (81.194 kg)   Body mass index is 25.68 kg/(m^2). Ideal Body Weight: Weight in (lb) to have BMI = 25: 173.9   GEN: WDWN, NAD, Non-toxic, Alert & Oriented x 3 HEENT: Atraumatic, Normocephalic.  Ears and Nose: No external deformity. EXTR: No clubbing/cyanosis/edema NEURO: Normal gait.  PSYCH: Normally interactive. Conversant. Not depressed or anxious appearing.  Calm demeanor.    Assessment and Plan: PPD Hypertension  Signed,  Ellison Carwin, MD

## 2013-08-31 ENCOUNTER — Ambulatory Visit (INDEPENDENT_AMBULATORY_CARE_PROVIDER_SITE_OTHER): Payer: Self-pay | Admitting: Radiology

## 2013-08-31 DIAGNOSIS — Z111 Encounter for screening for respiratory tuberculosis: Secondary | ICD-10-CM

## 2013-08-31 LAB — TB SKIN TEST
Induration: 0 mm
TB Skin Test: NEGATIVE

## 2013-09-25 ENCOUNTER — Other Ambulatory Visit: Payer: Self-pay | Admitting: *Deleted

## 2013-09-25 MED ORDER — CLOPIDOGREL BISULFATE 75 MG PO TABS: 75.0000 mg | ORAL_TABLET | Freq: Every day | ORAL | Status: DC

## 2013-11-12 ENCOUNTER — Encounter (INDEPENDENT_AMBULATORY_CARE_PROVIDER_SITE_OTHER): Payer: Self-pay | Admitting: *Deleted

## 2013-12-04 ENCOUNTER — Ambulatory Visit (INDEPENDENT_AMBULATORY_CARE_PROVIDER_SITE_OTHER): Payer: BC Managed Care – PPO | Admitting: Internal Medicine

## 2013-12-08 ENCOUNTER — Encounter: Payer: Self-pay | Admitting: Cardiovascular Disease

## 2013-12-08 ENCOUNTER — Ambulatory Visit (INDEPENDENT_AMBULATORY_CARE_PROVIDER_SITE_OTHER): Payer: BC Managed Care – PPO | Admitting: Cardiovascular Disease

## 2013-12-08 VITALS — BP 163/99 | HR 49 | Ht 72.0 in | Wt 188.0 lb

## 2013-12-08 DIAGNOSIS — Z955 Presence of coronary angioplasty implant and graft: Secondary | ICD-10-CM

## 2013-12-08 DIAGNOSIS — E785 Hyperlipidemia, unspecified: Secondary | ICD-10-CM

## 2013-12-08 DIAGNOSIS — I2510 Atherosclerotic heart disease of native coronary artery without angina pectoris: Secondary | ICD-10-CM

## 2013-12-08 DIAGNOSIS — R001 Bradycardia, unspecified: Secondary | ICD-10-CM

## 2013-12-08 DIAGNOSIS — I10 Essential (primary) hypertension: Secondary | ICD-10-CM

## 2013-12-08 DIAGNOSIS — Z136 Encounter for screening for cardiovascular disorders: Secondary | ICD-10-CM

## 2013-12-08 MED ORDER — METOPROLOL TARTRATE 25 MG PO TABS: 12.5000 mg | ORAL_TABLET | Freq: Two times a day (BID) | ORAL | Status: DC

## 2013-12-08 MED ORDER — AMLODIPINE BESYLATE 5 MG PO TABS: 5.0000 mg | ORAL_TABLET | Freq: Every day | ORAL | Status: DC

## 2013-12-08 MED ORDER — ATORVASTATIN CALCIUM 40 MG PO TABS: 40.0000 mg | ORAL_TABLET | Freq: Every evening | ORAL | Status: DC

## 2013-12-08 NOTE — Addendum Note (Signed)
Addended by: Earnest Bailey on: 12/08/2013 11:43 AM   Modules accepted: Orders

## 2013-12-08 NOTE — Progress Notes (Signed)
Patient ID: [name], male   DOB: 1960/08/28, 53 y.o.   MRN: [id number]      SUBJECTIVE: The patient is here to followup for coronary artery disease, hypertension, and hyperlipidemia. He denies chest pain, fatigue, lightheadedness, dizziness, leg swelling, and shortness of breath. He also denies hematuria and hematochezia. ECG today demonstrates sinus bradycardia, 49 bpm.   Review of Systems: As per "subjective", otherwise negative.  No Known Allergies  Current Outpatient Prescriptions  Medication Sig Dispense Refill  . aspirin 81 MG tablet Take 1 tablet (81 mg total) by mouth daily.    Marland Kitchen atorvastatin (LIPITOR) 40 MG tablet Take 1 tablet (40 mg total) by mouth every evening. 90 tablet 3  . clopidogrel (PLAVIX) 75 MG tablet Take 1 tablet (75 mg total) by mouth daily. 30 tablet 6  . HYDROcodone-acetaminophen (NORCO) 10-325 MG per tablet Take 1 tablet by mouth every 6 (six) hours as needed for pain.    Marland Kitchen lisinopril (PRINIVIL,ZESTRIL) 20 MG tablet Take 20 mg by mouth 2 (two) times daily.    . metoprolol (LOPRESSOR) 50 MG tablet Take 0.5 tablets (25 mg total) by mouth 2 (two) times daily.    . nitroGLYCERIN (NITROSTAT) 0.4 MG SL tablet Place 1 tablet (0.4 mg total) under the tongue every 5 (five) minutes as needed for chest pain (CP or SOB). 30 tablet 1   No current facility-administered medications for this visit.    Past Medical History  Diagnosis Date  . Hypertension   . Anxiety   . Hypercholesteremia   . Coronary artery disease     a. Diag cath 10/2012 for CP/abnormal nuc -> planned PCI s/p LAD atherectomy/DES placement 11/05/12.  Marland Kitchen GERD (gastroesophageal reflux disease)   . 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 Bradycardia     a. During 11/2012 adm: lopressor decreased.    Past Surgical History  Procedure Laterality Date  . Hemorrhoid surgery  ~ 2010  . Cardiac catheterization  10/29/2012    . Coronary angioplasty with stent placement  11/05/2012    History   Social History  . Marital Status: Married    Spouse Name: N/A    Number of Children: N/A  . Years of Education: N/A   Occupational History  . Not on file.   Social History Main Topics  . Smoking status: Former Smoker -- 3.00 packs/day for 32 years    Types: Cigarettes    Quit date: 11/29/2006  . Smokeless tobacco: Former Systems developer    Types: Chew     Comment: 11/05/2012 "chewed tobacco when I play ball; aien't chewed since age 47"  . Alcohol Use: Yes     Comment: 11/05/2012 "drink nonalcolholic beer only now; since ~ 3-4 weeks  ago; before that it was 8-10 beers/night"  . Drug Use: No  . Sexual Activity: Not Currently   Other Topics Concern  . Not on file   Social History Narrative  . No narrative on file   Ht 6' Wt 188 lbs Sats 98% RA BP 163/99  Pulse 49     PHYSICAL EXAM General: NAD HEENT: Normal. Neck: No JVD, no thyromegaly. Lungs: Clear to auscultation bilaterally with normal respiratory effort. CV: Nondisplaced PMI.  Bradycardic, regular rhythm, normal S1/S2, no S3/S4, no murmur. No pretibial or periankle edema.  No carotid bruit.  Abdomen: Soft, nontender, no hepatosplenomegaly, no distention.  Neurologic: Alert and oriented  x 3.  Psych: Normal affect. Skin: Normal. Musculoskeletal: Normal range of motion, no gross deformities. Extremities: No clubbing or cyanosis.   ECG: Most recent ECG reviewed.      ASSESSMENT AND PLAN: 1. CAD s/p LAD atherectomy and DES placement on 11/05/12: Symptomatically stable. Continue dual antiplatelet therapy, possibly for another 6 months. No bleeding problems with ASA and Plavix. Continue Lipitor. Reduce metoprolol to 12.5 mg bid for bradycardia, albeit asymptomatic. 2. Essential hypertension: Markedly elevated today. Will start amlodipine 5 mg daily. Already taking lisinopril 20 mg bid. 3. Hyperlipidemia: Lipids in 08/2013 showed TC 156, TG 114, HDL 68, LDL  65. Will continue atorvastatin 40 mg daily.   Dispo: f/u 6 months.  Kate Sable, M.D., F.A.C.C.

## 2013-12-08 NOTE — Patient Instructions (Signed)
   Decrease Metoprolol to 12.5mg  twice a day  (will have to 1/2 a 25mg  tablet )  Begin Norvasc 5mg  daily   New prescriptions sent to pharmacy on above. Continue all other medications.   Your physician wants you to follow up in: 6 months.  You will receive a reminder letter in the mail one-two months in advance.  If you don't receive a letter, please call our office to schedule the follow up appointment

## 2013-12-11 ENCOUNTER — Encounter (HOSPITAL_COMMUNITY): Payer: Self-pay | Admitting: Cardiovascular Disease

## 2014-01-15 ENCOUNTER — Ambulatory Visit (INDEPENDENT_AMBULATORY_CARE_PROVIDER_SITE_OTHER): Payer: 59 | Admitting: Internal Medicine

## 2014-01-15 ENCOUNTER — Telehealth (INDEPENDENT_AMBULATORY_CARE_PROVIDER_SITE_OTHER): Payer: Self-pay | Admitting: *Deleted

## 2014-01-15 ENCOUNTER — Encounter (INDEPENDENT_AMBULATORY_CARE_PROVIDER_SITE_OTHER): Payer: Self-pay | Admitting: Internal Medicine

## 2014-01-15 ENCOUNTER — Other Ambulatory Visit (INDEPENDENT_AMBULATORY_CARE_PROVIDER_SITE_OTHER): Payer: Self-pay | Admitting: *Deleted

## 2014-01-15 VITALS — BP 160/84 | HR 76 | Temp 97.7°F | Ht 72.0 in | Wt 192.0 lb

## 2014-01-15 DIAGNOSIS — Z1211 Encounter for screening for malignant neoplasm of colon: Secondary | ICD-10-CM

## 2014-01-15 DIAGNOSIS — Z79899 Other long term (current) drug therapy: Secondary | ICD-10-CM

## 2014-01-15 NOTE — Telephone Encounter (Signed)
Patient is scheduled for screening colonoscopy 02/18/14 and needs to stop Plavix 5 days and ASA 2 days before -- is this ok

## 2014-01-15 NOTE — Telephone Encounter (Signed)
Patient needs movi prep 

## 2014-01-15 NOTE — Progress Notes (Signed)
Subjective:    Patient ID: [name], male    DOB: 07/23/1960, 54 y.o.   MRN: [id number]  HPI Referred to office for colonoscopy by Dr. Gerarda Fraction at [facility]. Has never undergone a colonoscopy in the past. Appetite is good. No weight loss. No abdominal pain. Occasionally has acid reflux. He usually has a BM x 1 a day. No melena or BRRB. He walks on the tread mill every day. No GI problems.  Works putting in Sales executive. No family hx of colon cancer.    Hx of Cardiac stent x2  in 2014 and is maintained on Plavix and ASA.  Review of Systems Married. Four children in good health.     Past Medical History  Diagnosis Date  . Hypertension   . Anxiety   . Hypercholesteremia   . Coronary artery disease     a. Diag cath 10/2012 for CP/abnormal nuc -> planned PCI s/p LAD atherectomy/DES placement 11/05/12.  Marland Kitchen GERD (gastroesophageal reflux disease)   . 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 Bradycardia     a. During 11/2012 adm: lopressor decreased.    Past Surgical History  Procedure Laterality Date  . Hemorrhoid surgery  ~ 2010  . Cardiac catheterization  10/29/2012  . Coronary angioplasty with stent placement  11/05/2012  . Left heart catheterization with coronary angiogram N/A 10/30/2012    Procedure: LEFT HEART CATHETERIZATION WITH CORONARY ANGIOGRAM;  Surgeon: [name], MD;  Location: [facility] CATH LAB;  Service: Cardiovascular;  Laterality: N/A;  . Percutaneous coronary rotoblator intervention (pci-r) N/A 11/05/2012    Procedure: PERCUTANEOUS CORONARY ROTOBLATOR INTERVENTION (PCI-R);  Surgeon: [name], MD;  Location: [facility] CATH LAB;  Service: Cardiovascular;  Laterality: N/A;    No Known Allergies  Current Outpatient Prescriptions on File Prior to Visit  Medication Sig Dispense Refill  . amLODipine (NORVASC) 5 MG tablet Take 1 tablet (5 mg total) by mouth daily. 30 tablet 6  .  aspirin 81 MG tablet Take 1 tablet (81 mg total) by mouth daily.    Marland Kitchen atorvastatin (LIPITOR) 40 MG tablet Take 1 tablet (40 mg total) by mouth every evening. 30 tablet 6  . clopidogrel (PLAVIX) 75 MG tablet Take 1 tablet (75 mg total) by mouth daily. 30 tablet 6  . HYDROcodone-acetaminophen (NORCO) 10-325 MG per tablet Take 1 tablet by mouth every 6 (six) hours as needed for pain.    Marland Kitchen lisinopril (PRINIVIL,ZESTRIL) 20 MG tablet Take 20 mg by mouth 2 (two) times daily.    . metoprolol tartrate (LOPRESSOR) 25 MG tablet Take 0.5 tablets (12.5 mg total) by mouth 2 (two) times daily. (Patient taking differently: Take 25 mg by mouth 2 (two) times daily. ) 30 tablet 6  . nitroGLYCERIN (NITROSTAT) 0.4 MG SL tablet Place 1 tablet (0.4 mg total) under the tongue every 5 (five) minutes as needed for chest pain (CP or SOB). 30 tablet 1   No current facility-administered medications on file prior to visit.        Objective:   Physical Exam   Filed Vitals:   01/15/14 1505  Height: 6' (1.829 m)  Weight: 192 lb (87.091 kg)   Alert and oriented. Skin warm and dry. Oral mucosa is moist.   . Sclera anicteric, conjunctivae is pink. Thyroid not enlarged. No cervical lymphadenopathy. Lungs clear. Heart regular rate and rhythm.  Abdomen is soft.  Bowel sounds are positive. No hepatomegaly. No abdominal masses felt. No tenderness.  No edema to lower extremities.          Assessment & Plan:  Medication Management. Needs screening colonoscopy.

## 2014-01-15 NOTE — Telephone Encounter (Signed)
Per last OV notes (12/08/13) - patient was to continue dual antiplatelet therapy, possibly for another 6 months.   Will forward to provider for advice.

## 2014-01-15 NOTE — Patient Instructions (Signed)
Colonoscopy.  The risks and benefits such as perforation, bleeding, and infection were reviewed with the patient and is agreeable. 

## 2014-01-16 MED ORDER — PEG-KCL-NACL-NASULF-NA ASC-C 100 G PO SOLR: 1.0000 | Freq: Once | ORAL | Status: DC

## 2014-01-19 NOTE — Telephone Encounter (Signed)
Patient aware.

## 2014-01-19 NOTE — Telephone Encounter (Signed)
Can hold Plavix but would continue ASA 81 mg.

## 2014-02-17 ENCOUNTER — Telehealth (INDEPENDENT_AMBULATORY_CARE_PROVIDER_SITE_OTHER): Payer: Self-pay | Admitting: *Deleted

## 2014-02-17 NOTE — Telephone Encounter (Signed)
Patient canceled TCS for tomorrow (2/17), states work is slow and can't afford to miss it

## 2014-02-17 NOTE — Telephone Encounter (Signed)
noted 

## 2014-02-18 ENCOUNTER — Encounter (HOSPITAL_COMMUNITY): Admission: RE | Payer: Self-pay | Source: Ambulatory Visit

## 2014-02-18 ENCOUNTER — Ambulatory Visit (HOSPITAL_COMMUNITY): Admission: RE | Admit: 2014-02-18 | Payer: 59 | Source: Ambulatory Visit | Admitting: Internal Medicine

## 2014-02-18 SURGERY — COLONOSCOPY
Anesthesia: Moderate Sedation

## 2014-04-29 ENCOUNTER — Other Ambulatory Visit: Payer: Self-pay | Admitting: *Deleted

## 2014-04-29 ENCOUNTER — Other Ambulatory Visit: Payer: Self-pay | Admitting: Cardiovascular Disease

## 2014-04-29 MED ORDER — CLOPIDOGREL BISULFATE 75 MG PO TABS: 75.0000 mg | ORAL_TABLET | Freq: Every day | ORAL | Status: DC

## 2014-04-29 NOTE — Telephone Encounter (Signed)
Received fax refill request  Rx # [id number] Medication:  Clopidogrel 75 mg tablets Qty 30 Sig:  Take one tablet by mouth daily Physician:  Bronson Ing

## 2014-04-29 NOTE — Telephone Encounter (Signed)
Medication sent to pharmacy  

## 2014-05-18 ENCOUNTER — Other Ambulatory Visit: Payer: Self-pay | Admitting: *Deleted

## 2014-05-18 MED ORDER — AMLODIPINE BESYLATE 5 MG PO TABS: 5.0000 mg | ORAL_TABLET | Freq: Every day | ORAL | Status: DC

## 2014-06-19 ENCOUNTER — Other Ambulatory Visit: Payer: Self-pay | Admitting: *Deleted

## 2014-06-19 MED ORDER — METOPROLOL TARTRATE 25 MG PO TABS: 12.5000 mg | ORAL_TABLET | Freq: Two times a day (BID) | ORAL | Status: DC

## 2014-07-01 ENCOUNTER — Ambulatory Visit: Payer: 59 | Admitting: Cardiovascular Disease

## 2014-07-15 ENCOUNTER — Other Ambulatory Visit: Payer: Self-pay | Admitting: *Deleted

## 2014-07-15 MED ORDER — AMLODIPINE BESYLATE 5 MG PO TABS: 5.0000 mg | ORAL_TABLET | Freq: Every day | ORAL | Status: DC

## 2014-07-27 ENCOUNTER — Ambulatory Visit (INDEPENDENT_AMBULATORY_CARE_PROVIDER_SITE_OTHER): Payer: 59 | Admitting: Cardiovascular Disease

## 2014-07-27 ENCOUNTER — Encounter: Payer: Self-pay | Admitting: Cardiovascular Disease

## 2014-07-27 VITALS — BP 112/88 | HR 64 | Ht 72.0 in | Wt 186.0 lb

## 2014-07-27 DIAGNOSIS — E785 Hyperlipidemia, unspecified: Secondary | ICD-10-CM | POA: Diagnosis not present

## 2014-07-27 DIAGNOSIS — Z955 Presence of coronary angioplasty implant and graft: Secondary | ICD-10-CM | POA: Diagnosis not present

## 2014-07-27 DIAGNOSIS — I2510 Atherosclerotic heart disease of native coronary artery without angina pectoris: Secondary | ICD-10-CM

## 2014-07-27 DIAGNOSIS — I10 Essential (primary) hypertension: Secondary | ICD-10-CM

## 2014-07-27 MED ORDER — NITROGLYCERIN 0.4 MG SL SUBL: 0.4000 mg | SUBLINGUAL_TABLET | SUBLINGUAL | Status: DC | PRN

## 2014-07-27 NOTE — Patient Instructions (Signed)
   Stop Plavix.  Stop Pravastatin.  Continue Lipitor '40mg'$  daily.  Refill sent to [facility] for Nitroglycerin.  Continue all other medications.   Lab for Lipids - order given today.  Reminder:  Nothing to eat or drink after 12 midnight prior to labs.  Office will contact with results via phone or letter.   Your physician wants you to follow up in:  1 year.  You will receive a reminder letter in the mail one-two months in advance.  If you don't receive a letter, please call our office to schedule the follow up appointment

## 2014-07-27 NOTE — Progress Notes (Signed)
Patient ID: [name], male   DOB: 1960-08-15, 54 y.o.   MRN: [id number]      SUBJECTIVE: The patient is here to followup for coronary artery disease, hypertension, and hyperlipidemia. He denies chest pain, fatigue, lightheadedness, dizziness, leg swelling, and shortness of breath.  He lost his sublingual nitroglycerin. He is taking both pravastatin 40 mg and Lipitor 40 mg.   Review of Systems: As per "subjective", otherwise negative.  No Known Allergies  Current Outpatient Prescriptions  Medication Sig Dispense Refill  . amLODipine (NORVASC) 5 MG tablet Take 1 tablet (5 mg total) by mouth daily. 30 tablet 6  . aspirin 81 MG tablet Take 1 tablet (81 mg total) by mouth daily.    Marland Kitchen atorvastatin (LIPITOR) 40 MG tablet Take 1 tablet (40 mg total) by mouth every evening. 30 tablet 6  . clopidogrel (PLAVIX) 75 MG tablet Take 1 tablet (75 mg total) by mouth daily. 30 tablet 3  . diazepam (VALIUM) 5 MG tablet Take 5 mg by mouth every 6 (six) hours as needed for anxiety.    Marland Kitchen HYDROcodone-acetaminophen (NORCO) 10-325 MG per tablet Take 1 tablet by mouth every 6 (six) hours as needed for pain.    Marland Kitchen lisinopril (PRINIVIL,ZESTRIL) 20 MG tablet Take 20 mg by mouth 2 (two) times daily.    . metoprolol tartrate (LOPRESSOR) 25 MG tablet Take 0.5 tablets (12.5 mg total) by mouth 2 (two) times daily. 30 tablet 6  . nitroGLYCERIN (NITROSTAT) 0.4 MG SL tablet Place 1 tablet (0.4 mg total) under the tongue every 5 (five) minutes as needed for chest pain (CP or SOB). 30 tablet 1  . peg 3350 powder (MOVIPREP) 100 G SOLR Take 1 kit (200 g total) by mouth once. 1 kit 0  . pravastatin (PRAVACHOL) 40 MG tablet Take 40 mg by mouth daily.     No current facility-administered medications for this visit.    Past Medical History  Diagnosis Date  . Hypertension   . Anxiety   . Hypercholesteremia   . Coronary artery disease     a. Diag cath 10/2012 for CP/abnormal nuc -> planned PCI s/p LAD atherectomy/DES  placement 11/05/12.  Marland Kitchen GERD (gastroesophageal reflux disease)   . 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 Bradycardia     a. During 11/2012 adm: lopressor decreased.    Past Surgical History  Procedure Laterality Date  . Hemorrhoid surgery  ~ 2010  . Cardiac catheterization  10/29/2012  . Coronary angioplasty with stent placement  11/05/2012  . Left heart catheterization with coronary angiogram N/A 10/30/2012    Procedure: LEFT HEART CATHETERIZATION WITH CORONARY ANGIOGRAM;  Surgeon: [name], MD;  Location: [place] CATH LAB;  Service: Cardiovascular;  Laterality: N/A;  . Percutaneous coronary rotoblator intervention (pci-r) N/A 11/05/2012    Procedure: PERCUTANEOUS CORONARY ROTOBLATOR INTERVENTION (PCI-R);  Surgeon: [name], MD;  Location: [facility] CATH LAB;  Service: Cardiovascular;  Laterality: N/A;    History   Social History  . Marital Status: Married    Spouse Name: N/A  . Number of Children: N/A  . Years of Education: N/A   Occupational History  . Not on file.   Social History Main Topics  . Smoking status: Former Smoker -- 3.00 packs/day for 32 years    Types: Cigarettes    Start date: 08/29/1977    Quit date: 11/29/2006  . Smokeless  tobacco: Former Systems developer    Types: Chew     Comment: 11/05/2012 "chewed tobacco when I play ball; aien't chewed since age 36"  . Alcohol Use: 0.0 oz/week    0 Standard drinks or equivalent per week     Comment: few beers every day  . Drug Use: No  . Sexual Activity: Not Currently   Other Topics Concern  . Not on file   Social History Narrative     Filed Vitals:   07/27/14 1359  BP: 112/88  Pulse: 64  Height: 6' (1.829 m)  Weight: 186 lb (84.369 kg)  SpO2: 98%    PHYSICAL EXAM General: NAD. HEENT: Poor dentition. Neck: No JVD, no thyromegaly. Lungs: Clear to auscultation bilaterally with normal respiratory effort. CV: Nondisplaced PMI.   Regular rate and rhythm, normal S1/S2, no S3/S4, no murmur. No pretibial or periankle edema.  No carotid bruit.   Abdomen: Soft, nontender, no distention.  Neurologic: Alert and oriented x 3.  Psych: Normal affect. Skin: Normal. Musculoskeletal: No gross deformities. Extremities: No clubbing or cyanosis.   ECG: Most recent ECG reviewed.      ASSESSMENT AND PLAN: 1. CAD s/p LAD atherectomy and DES placement on 11/05/12: Symptomatically stable. Will d/c Plavix and continue ASA, atorvastatin, and metoprolol. Will refill SL nitroglycerin.  2. Essential hypertension: Controlled. No changes.  3. Hyperlipidemia: Check lipids. Continue atorvastatin 40 mg daily and d/c pravastatin.  Dispo: f/u 1 year.   Kate Sable, M.D., F.A.C.C.

## 2014-08-12 ENCOUNTER — Other Ambulatory Visit: Payer: Self-pay | Admitting: Cardiovascular Disease

## 2014-08-25 ENCOUNTER — Other Ambulatory Visit: Payer: Self-pay | Admitting: *Deleted

## 2014-08-25 MED ORDER — ATORVASTATIN CALCIUM 40 MG PO TABS
40.0000 mg | ORAL_TABLET | Freq: Every evening | ORAL | Status: DC
Start: 2014-08-25 — End: 2015-03-19

## 2014-09-11 ENCOUNTER — Encounter: Payer: Self-pay | Admitting: *Deleted

## 2015-01-14 ENCOUNTER — Telehealth: Payer: Self-pay | Admitting: *Deleted

## 2015-01-14 DIAGNOSIS — E785 Hyperlipidemia, unspecified: Secondary | ICD-10-CM

## 2015-01-14 NOTE — Telephone Encounter (Signed)
Patient calling to request lab order for cholesterol labs.    Patient last seen 07/27/14 & Lipids were requested at that office visit.  Stated he had been busy & not had time to get done yet.  Will mail order to patient as he is requesting to do these now.

## 2015-01-18 ENCOUNTER — Ambulatory Visit: Payer: 59 | Admitting: Cardiovascular Disease

## 2015-01-25 ENCOUNTER — Other Ambulatory Visit: Payer: Self-pay | Admitting: Cardiovascular Disease

## 2015-02-23 ENCOUNTER — Encounter: Payer: Self-pay | Admitting: *Deleted

## 2015-03-19 ENCOUNTER — Other Ambulatory Visit: Payer: Self-pay | Admitting: *Deleted

## 2015-03-19 MED ORDER — ATORVASTATIN CALCIUM 40 MG PO TABS: 40.0000 mg | ORAL_TABLET | Freq: Every evening | ORAL | Status: DC

## 2015-03-22 ENCOUNTER — Other Ambulatory Visit: Payer: Self-pay | Admitting: Cardiovascular Disease

## 2015-04-29 DIAGNOSIS — K219 Gastro-esophageal reflux disease without esophagitis: Secondary | ICD-10-CM | POA: Diagnosis not present

## 2015-04-29 DIAGNOSIS — Z6826 Body mass index (BMI) 26.0-26.9, adult: Secondary | ICD-10-CM | POA: Diagnosis not present

## 2015-04-29 DIAGNOSIS — Z1389 Encounter for screening for other disorder: Secondary | ICD-10-CM | POA: Diagnosis not present

## 2015-04-29 DIAGNOSIS — G894 Chronic pain syndrome: Secondary | ICD-10-CM | POA: Diagnosis not present

## 2015-04-29 DIAGNOSIS — N400 Enlarged prostate without lower urinary tract symptoms: Secondary | ICD-10-CM | POA: Diagnosis not present

## 2015-04-29 DIAGNOSIS — K429 Umbilical hernia without obstruction or gangrene: Secondary | ICD-10-CM | POA: Diagnosis not present

## 2015-04-29 DIAGNOSIS — I10 Essential (primary) hypertension: Secondary | ICD-10-CM | POA: Diagnosis not present

## 2015-05-18 ENCOUNTER — Other Ambulatory Visit: Payer: Self-pay | Admitting: Cardiovascular Disease

## 2015-05-26 ENCOUNTER — Other Ambulatory Visit: Payer: Self-pay | Admitting: Cardiovascular Disease

## 2015-05-27 DIAGNOSIS — K429 Umbilical hernia without obstruction or gangrene: Secondary | ICD-10-CM | POA: Diagnosis not present

## 2015-05-28 ENCOUNTER — Encounter (HOSPITAL_COMMUNITY)
Admission: RE | Admit: 2015-05-28 | Discharge: 2015-05-28 | Disposition: A | Discharge status: Home / Self Care | Payer: BLUE CROSS/BLUE SHIELD | Source: Ambulatory Visit | Attending: General Surgery | Admitting: General Surgery

## 2015-05-28 ENCOUNTER — Encounter (HOSPITAL_COMMUNITY): Payer: Self-pay

## 2015-05-28 ENCOUNTER — Other Ambulatory Visit: Payer: Self-pay

## 2015-05-28 DIAGNOSIS — Z01812 Encounter for preprocedural laboratory examination: Secondary | ICD-10-CM | POA: Diagnosis not present

## 2015-05-28 DIAGNOSIS — K429 Umbilical hernia without obstruction or gangrene: Secondary | ICD-10-CM | POA: Diagnosis not present

## 2015-05-28 DIAGNOSIS — Z01810 Encounter for preprocedural cardiovascular examination: Secondary | ICD-10-CM | POA: Insufficient documentation

## 2015-05-28 HISTORY — DX: Unspecified osteoarthritis, unspecified site: M19.90

## 2015-05-28 LAB — CBC WITH DIFFERENTIAL/PLATELET
Basophils Absolute: 0 10*3/uL (ref 0.0–0.1)
Basophils Relative: 0 %
Eosinophils Absolute: 0.1 10*3/uL (ref 0.0–0.7)
Eosinophils Relative: 2 %
HCT: 40.3 % (ref 39.0–52.0)
Hemoglobin: 13.7 g/dL (ref 13.0–17.0)
Lymphocytes Relative: 26 %
Lymphs Abs: 1.6 10*3/uL (ref 0.7–4.0)
MCH: 32.3 pg (ref 26.0–34.0)
MCHC: 34 g/dL (ref 30.0–36.0)
MCV: 95 fL (ref 78.0–100.0)
Monocytes Absolute: 0.7 10*3/uL (ref 0.1–1.0)
Monocytes Relative: 11 %
Neutro Abs: 3.7 10*3/uL (ref 1.7–7.7)
Neutrophils Relative %: 61 %
Platelets: 204 10*3/uL (ref 150–400)
RBC: 4.24 MIL/uL (ref 4.22–5.81)
RDW: 12.7 % (ref 11.5–15.5)
WBC: 6.2 10*3/uL (ref 4.0–10.5)

## 2015-05-28 LAB — BASIC METABOLIC PANEL
Anion gap: 7 (ref 5–15)
BUN: 14 mg/dL (ref 6–20)
CO2: 25 mmol/L (ref 22–32)
Calcium: 9.3 mg/dL (ref 8.9–10.3)
Chloride: 99 mmol/L — ABNORMAL LOW (ref 101–111)
Creatinine, Ser: 1.11 mg/dL (ref 0.61–1.24)
GFR calc Af Amer: >60 mL/min (ref >60)
GFR calc non Af Amer: >60 mL/min (ref >60)
Glucose, Bld: 107 mg/dL — ABNORMAL HIGH (ref 65–99)
Potassium: 4.8 mmol/L (ref 3.5–5.1)
Sodium: 131 mmol/L — ABNORMAL LOW (ref 135–145)

## 2015-05-28 NOTE — Patient Instructions (Signed)
[name]  05/28/2015     '@PREFPERIOPPHARMACY'$ @   Your procedure is scheduled on 06/02/2015.  Report to [facility] at 9:15 A.M.  Call this number if you have problems the morning of surgery:  [phone]   Remember:  Do not eat food or drink liquids after midnight.  Take these medicines the morning of surgery with A SIP OF WATER: NORVASC, VALIUM, NORCO, LISINOPRIL AND LOPRESSOR  Do not wear jewelry, make-up or nail polish.  Do not wear lotions, powders, or perfumes.  You may wear deodorant.  Do not shave 48 hours prior to surgery.  Men may shave face and neck.  Do not bring valuables to the hospital.  [facility] is not responsible for any belongings or valuables.  Contacts, dentures or bridgework may not be worn into surgery.  Leave your suitcase in the car.  After surgery it may be brought to your room.  For patients admitted to the hospital, discharge time will be determined by your treatment team.  Patients discharged the day of surgery will not be allowed to drive home.   Name and phone number of your driver:   [facility] Special instructions:  N/A  Please read over the following fact sheets that you were given. Care and Recovery After Surgery   Open Hernia Repair Open hernia repair is surgery to fix a hernia. A hernia occurs when an internal organ or tissue pushes out through a weak spot in the abdominal wall muscles. Hernias commonly occur in the groin and around the navel. Most hernias tend to get worse over time. Surgery is often done to prevent the hernia from getting bigger, becoming uncomfortable, or becoming an emergency. Emergency surgery may be needed if abdominal contents get stuck in the opening (incarcerated hernia) or the blood supply gets cut off (strangulated hernia). In an open repair, a large cut (incision) is made in the abdomen to perform the surgery. LET [facility] CARE PROVIDER KNOW ABOUT:  Any allergies you have.  All medicines you are taking,  including vitamins, herbs, eye drops, creams, and over-the-counter medicines.  Previous problems you or members of your family have had with the use of anesthetics.  Any blood disorders you have.  Previous surgeries you have had.  Medical conditions you have. RISKS AND COMPLICATIONS Generally, this is a safe procedure. However, as with any procedure, complications can occur. Possible complications include:  Infection.  Bleeding.  Nerve injury.  Chronic pain.  The hernia can come back.  Injury to the intestines. BEFORE THE PROCEDURE  Ask your health care provider about changing or stopping any regular medicines. Avoid taking aspirin or blood thinners as directed by your health care provider.  Do noteat or drink anything after midnight the night before surgery.  If you smoke, do not smoke for at least 2 weeks before your surgery.  Do not drink alcohol the day before your surgery.  Let your health care provider know if you develop a cold or any infection before your surgery.  Arrange for someone to drive you home after the procedure or after your hospital stay. Also arrange for someone to help you with activities during recovery. PROCEDURE   Small monitors will be put on your body. They are used to check your heart, blood pressure, and oxygen level.   An IV access tube will be put into one of your veins. Medicine will be able to flow directly into your body through this IV tube.   You might  be given a medicine to help you relax (sedative).   You will be given a medicine to make you sleep (general anesthetic). A breathing tube may be placed into your lungs during the procedure.  A cut (incision) is made over the hernia defect, and the contents are pushed back into the abdomen.  If the hernia is small, stitches may be used to bring the muscle edges back together.  Typically, a surgeon will place a mesh patch made of man-made material (synthetic) to cover the defect.  The mesh is sewn to healthy muscle. This reduces the risk of the hernia coming back.  The tissue and skin over the hernia are then closed with stitches or staples.  If the hernia was large, a drain may be left in place to collect excess fluid where the hernia used to be.  Bandages (dressings) are used to cover the incision. AFTER THE PROCEDURE  You will be taken to a recovery area where your progress will be monitored.  If the hernia was small or in the groin (inguinal) region, you will likely be allowed to go home once you are awake, stable, and taking fluids well.  If the hernia was large, you may have to wait for your bowel function to return. You may need to stay in the hospital for 2-3 days until you can eat and your pain is controlled. A drain may be left in place for 5-7 days. You will be taught how to care for the drain.   This information is not intended to replace advice given to you by your health care provider. Make sure you discuss any questions you have with your health care provider.   Document Released: 06/14/2000 Document Revised: 10/09/2012 Document Reviewed: 07/31/2012 Elsevier Interactive Patient Education [employer].

## 2015-05-28 NOTE — Pre-Procedure Instructions (Signed)
Patient given information to sign up for my chart at home. 

## 2015-05-28 NOTE — H&P (Signed)
  NTS SOAP Note  Vital Signs:  Vitals as of: 2/80/0349: Systolic 179: Diastolic 150: Heart Rate 74: Temp 98.52F (Temporal): Height 58f 0in: Weight 182Lbs 0 Ounces: Pain Level 10: BMI 24.68   BMI : 24.68 kg/m2  Subjective: This 55year old male presents for of an umbilical hernia.  Has been present for some time, but is increasing in size and causing him discomfort.  No nausea, vomiting.  Made worse with straining, coughing.  Resolves when lying down.  Review of Symptoms:  Constitutional:negative Head:negative Eyes:negative Nose/Mouth/Throat:negative Cardiovascular:negative Respiratory:negative Gastrointestinnegative Genitourinary:negative Musculoskeletal:negative Skin:negative Hematolgic/Lymphatic:negative Allergic/Immunologic:negative   Past Medical History:Reviewed  Past Medical History  Surgical History: coronary stent placement Medical Problems: CAD, high cholesterol, HTN Allergies: nkda Medications: plavix, metoprolol, naproxen, diazepam, lortab, atorvastatin   Social History:Reviewed  Social History  Preferred Language: English Race:  White Ethnicity: Not Hispanic / Latino Age: [age]year Marital Status:  M Alcohol: 12 pack a week   Smoking Status: Former smoker reviewed on 05/27/2015 Started Date:  Stopped Date:  Functional Status reviewed on 05/27/2015 ------------------------------------------------ Bathing: Normal Cooking: Normal Dressing: Normal Driving: Normal Eating: Normal Managing Meds: Normal Oral Care: Normal Shopping: Normal Toileting: Normal Transferring: Normal Walking: Normal Cognitive Status reviewed on 05/27/2015 ------------------------------------------------ Attention: Normal Decision Making: Normal Language: Normal Memory: Normal Motor: Normal Perception: Normal Problem Solving: Normal Visual and Spatial: Normal   Family History:Reviewed  Family Health History Mother, Living; Hypertension (high  blood pressure);  Father, Living; Hypertension (high blood pressure);     Objective Information: General:Well appearing, well nourished in no distress. Skin:no rash or prominent lesions Head:Atraumatic; no masses; no abnormalities Neck:Supple without lymphadenopathy.  Heart:RRR, no murmur or gallop.  Normal S1, S2.  No S3, S4.  Lungs:CTA bilaterally, no wheezes, rhonchi, rales.  Breathing unlabored. Abdomen:Soft, NT/ND, normal bowel sounds, no HSM, no masses.  No peritoneal signs.  Reducible umbilical hernia present.  Assessment:Umbilical hernia  Diagnoses: 5569.7 KX48.0Umbilical hernia (Umbilical hernia without obstruction or gangrene)  Procedures: 916553- OFFICE OUTPATIENT NEW 30 MINUTES    Plan:  Scheduled for umbilical herniorrhaphy with mesh on 06/02/15.   Patient Education:Alternative treatments to surgery were discussed with patient (and family).Risks and benefits  of procedure including bleeding, infection, mesh use, and recurrence of the hernia were fully explained to the patient (and family) who gave informed consent. Patient/family questions were addressed.  Follow-up:Pending Surgery

## 2015-05-28 NOTE — Progress Notes (Signed)
   05/28/15 1337  OBSTRUCTIVE SLEEP APNEA  Have you ever been diagnosed with sleep apnea through a sleep study? No  Do you snore loudly (loud enough to be heard through closed doors)?  1  Do you often feel tired, fatigued, or sleepy during the daytime (such as falling asleep during driving or talking to someone)? 0  Has anyone observed you stop breathing during your sleep? 1  Do you have, or are you being treated for high blood pressure? 1  Age > 50 (1-yes) 1  Neck circumference greater than:Male 16 inches or larger, Male 17inches or larger? 0  Male Gender (Yes=1) 1  Obstructive Sleep Apnea Score 5  Score 5 or greater  Results sent to PCP

## 2015-06-02 ENCOUNTER — Encounter (HOSPITAL_COMMUNITY): Payer: Self-pay | Admitting: *Deleted

## 2015-06-02 ENCOUNTER — Ambulatory Visit (HOSPITAL_COMMUNITY)
Admission: RE | Admit: 2015-06-02 | Discharge: 2015-06-02 | Disposition: A | Discharge status: Home / Self Care | Payer: BLUE CROSS/BLUE SHIELD | Source: Ambulatory Visit | Attending: General Surgery | Admitting: General Surgery

## 2015-06-02 ENCOUNTER — Ambulatory Visit (HOSPITAL_COMMUNITY): Payer: BLUE CROSS/BLUE SHIELD | Admitting: Anesthesiology

## 2015-06-02 ENCOUNTER — Encounter (HOSPITAL_COMMUNITY)
Admission: RE | Disposition: A | Discharge status: Home / Self Care | Payer: Self-pay | Source: Ambulatory Visit | Attending: General Surgery

## 2015-06-02 DIAGNOSIS — I10 Essential (primary) hypertension: Secondary | ICD-10-CM | POA: Diagnosis not present

## 2015-06-02 DIAGNOSIS — K219 Gastro-esophageal reflux disease without esophagitis: Secondary | ICD-10-CM | POA: Diagnosis not present

## 2015-06-02 DIAGNOSIS — K429 Umbilical hernia without obstruction or gangrene: Secondary | ICD-10-CM | POA: Diagnosis not present

## 2015-06-02 DIAGNOSIS — F419 Anxiety disorder, unspecified: Secondary | ICD-10-CM | POA: Diagnosis not present

## 2015-06-02 DIAGNOSIS — Z87891 Personal history of nicotine dependence: Secondary | ICD-10-CM | POA: Diagnosis not present

## 2015-06-02 DIAGNOSIS — I2510 Atherosclerotic heart disease of native coronary artery without angina pectoris: Secondary | ICD-10-CM | POA: Insufficient documentation

## 2015-06-02 DIAGNOSIS — E7800 Pure hypercholesterolemia, unspecified: Secondary | ICD-10-CM | POA: Diagnosis not present

## 2015-06-02 HISTORY — PX: UMBILICAL HERNIA REPAIR: SHX196

## 2015-06-02 HISTORY — PX: INSERTION OF MESH: SHX5868

## 2015-06-02 SURGERY — REPAIR, HERNIA, UMBILICAL, ADULT
Anesthesia: General | Site: Abdomen

## 2015-06-02 MED ORDER — PROPOFOL 10 MG/ML IV BOLUS
INTRAVENOUS | Status: DC | PRN
  Administered 2015-06-02: 20 mg via INTRAVENOUS
  Administered 2015-06-02: 160 mg via INTRAVENOUS

## 2015-06-02 MED ORDER — CEFAZOLIN SODIUM-DEXTROSE 2-4 GM/100ML-% IV SOLN
2.0000 g | INTRAVENOUS | Status: AC
  Administered 2015-06-02: 2 g via INTRAVENOUS

## 2015-06-02 MED ORDER — FENTANYL CITRATE (PF) 100 MCG/2ML IJ SOLN: 25.0000 ug | INTRAMUSCULAR | Status: DC | PRN

## 2015-06-02 MED ORDER — PROPOFOL 10 MG/ML IV BOLUS
INTRAVENOUS | Status: AC
  Filled 2015-06-02: qty 20

## 2015-06-02 MED ORDER — LACTATED RINGERS IV SOLN
INTRAVENOUS | Status: DC
  Administered 2015-06-02: 11:00:00 via INTRAVENOUS

## 2015-06-02 MED ORDER — BUPIVACAINE HCL (PF) 0.5 % IJ SOLN
INTRAMUSCULAR | Status: DC | PRN
  Administered 2015-06-02: 7 mL

## 2015-06-02 MED ORDER — FENTANYL CITRATE (PF) 100 MCG/2ML IJ SOLN
INTRAMUSCULAR | Status: DC | PRN
  Administered 2015-06-02: 25 ug via INTRAVENOUS
  Administered 2015-06-02 (×3): 50 ug via INTRAVENOUS

## 2015-06-02 MED ORDER — MIDAZOLAM HCL 2 MG/2ML IJ SOLN
1.0000 mg | INTRAMUSCULAR | Status: DC | PRN
  Administered 2015-06-02: 2 mg via INTRAVENOUS

## 2015-06-02 MED ORDER — LABETALOL HCL 5 MG/ML IV SOLN
10.0000 mg | INTRAVENOUS | Status: DC | PRN
  Administered 2015-06-02: 10 mg via INTRAVENOUS

## 2015-06-02 MED ORDER — MIDAZOLAM HCL 5 MG/5ML IJ SOLN
INTRAMUSCULAR | Status: DC | PRN
  Administered 2015-06-02: 2 mg via INTRAVENOUS

## 2015-06-02 MED ORDER — ONDANSETRON HCL 4 MG/2ML IJ SOLN
INTRAMUSCULAR | Status: AC
  Filled 2015-06-02: qty 2

## 2015-06-02 MED ORDER — POVIDONE-IODINE 10 % EX OINT
TOPICAL_OINTMENT | CUTANEOUS | Status: AC
  Filled 2015-06-02: qty 1

## 2015-06-02 MED ORDER — CHLORHEXIDINE GLUCONATE 4 % EX LIQD: 1.0000 "application " | Freq: Once | CUTANEOUS | Status: DC

## 2015-06-02 MED ORDER — ATROPINE SULFATE 0.4 MG/ML IJ SOLN
INTRAMUSCULAR | Status: DC | PRN
  Administered 2015-06-02: .8 mg via INTRAVENOUS

## 2015-06-02 MED ORDER — ONDANSETRON HCL 4 MG/2ML IJ SOLN
4.0000 mg | Freq: Once | INTRAMUSCULAR | Status: AC
  Administered 2015-06-02: 4 mg via INTRAVENOUS

## 2015-06-02 MED ORDER — SODIUM CHLORIDE 0.9 % IR SOLN
Status: DC | PRN
  Administered 2015-06-02: 1000 mL

## 2015-06-02 MED ORDER — MIDAZOLAM HCL 2 MG/2ML IJ SOLN
INTRAMUSCULAR | Status: AC
  Filled 2015-06-02: qty 2

## 2015-06-02 MED ORDER — CEFAZOLIN SODIUM-DEXTROSE 2-4 GM/100ML-% IV SOLN
INTRAVENOUS | Status: AC
  Filled 2015-06-02: qty 100

## 2015-06-02 MED ORDER — ATROPINE SULFATE 0.4 MG/ML IJ SOLN
INTRAMUSCULAR | Status: AC
  Filled 2015-06-02: qty 2

## 2015-06-02 MED ORDER — EPHEDRINE SULFATE 50 MG/ML IJ SOLN
INTRAMUSCULAR | Status: DC | PRN
  Administered 2015-06-02: 10 mg via INTRAVENOUS

## 2015-06-02 MED ORDER — POVIDONE-IODINE 10 % OINT PACKET
TOPICAL_OINTMENT | CUTANEOUS | Status: DC | PRN
  Administered 2015-06-02: 1 via TOPICAL

## 2015-06-02 MED ORDER — FENTANYL CITRATE (PF) 250 MCG/5ML IJ SOLN
INTRAMUSCULAR | Status: AC
  Filled 2015-06-02: qty 5

## 2015-06-02 MED ORDER — LABETALOL HCL 5 MG/ML IV SOLN
INTRAVENOUS | Status: AC
  Filled 2015-06-02: qty 4

## 2015-06-02 MED ORDER — ONDANSETRON HCL 4 MG/2ML IJ SOLN: 4.0000 mg | Freq: Once | INTRAMUSCULAR | Status: DC | PRN

## 2015-06-02 MED ORDER — LIDOCAINE HCL 1 % IJ SOLN
INTRAMUSCULAR | Status: DC | PRN
  Administered 2015-06-02: 30 mg via INTRADERMAL

## 2015-06-02 MED ORDER — HYDROCODONE-ACETAMINOPHEN 10-325 MG PO TABS: 1.0000 | ORAL_TABLET | ORAL | Status: DC | PRN

## 2015-06-02 MED ORDER — KETOROLAC TROMETHAMINE 30 MG/ML IJ SOLN
30.0000 mg | Freq: Once | INTRAMUSCULAR | Status: AC
  Administered 2015-06-02: 30 mg via INTRAVENOUS
  Filled 2015-06-02: qty 1

## 2015-06-02 MED ORDER — BUPIVACAINE HCL (PF) 0.5 % IJ SOLN
INTRAMUSCULAR | Status: AC
  Filled 2015-06-02: qty 30

## 2015-06-02 SURGICAL SUPPLY — 36 items
BAG HAMPER (MISCELLANEOUS) ×2 IMPLANT
BLADE SURG SZ11 CARB STEEL (BLADE) ×2 IMPLANT
CHLORAPREP W/TINT 26ML (MISCELLANEOUS) ×2 IMPLANT
CLOTH BEACON ORANGE TIMEOUT ST (SAFETY) ×2 IMPLANT
COVER LIGHT HANDLE STERIS (MISCELLANEOUS) ×4 IMPLANT
DECANTER SPIKE VIAL GLASS SM (MISCELLANEOUS) ×2 IMPLANT
ELECT REM PT RETURN 9FT ADLT (ELECTROSURGICAL) ×2
ELECTRODE REM PT RTRN 9FT ADLT (ELECTROSURGICAL) ×1 IMPLANT
GLOVE BIOGEL PI IND STRL 7.0 (GLOVE) ×1 IMPLANT
GLOVE BIOGEL PI IND STRL 7.5 (GLOVE) ×1 IMPLANT
GLOVE BIOGEL PI INDICATOR 7.0 (GLOVE) ×1
GLOVE BIOGEL PI INDICATOR 7.5 (GLOVE) ×1
GLOVE ECLIPSE 6.5 STRL STRAW (GLOVE) ×4 IMPLANT
GLOVE ECLIPSE 7.0 STRL STRAW (GLOVE) ×2 IMPLANT
GLOVE EXAM NITRILE MD LF STRL (GLOVE) ×2 IMPLANT
GLOVE SURG SS PI 7.5 STRL IVOR (GLOVE) ×4 IMPLANT
GOWN STRL REUS W/ TWL LRG LVL3 (GOWN DISPOSABLE) ×1 IMPLANT
GOWN STRL REUS W/TWL LRG LVL3 (GOWN DISPOSABLE) ×7 IMPLANT
INST SET MINOR GENERAL (KITS) ×2 IMPLANT
KIT ROOM TURNOVER APOR (KITS) ×2 IMPLANT
MANIFOLD NEPTUNE II (INSTRUMENTS) ×2 IMPLANT
MESH VENTRALEX ST 2.5 CRC MED (Mesh General) ×2 IMPLANT
NEEDLE HYPO 25X1 1.5 SAFETY (NEEDLE) ×2 IMPLANT
NS IRRIG 1000ML POUR BTL (IV SOLUTION) ×2 IMPLANT
PACK MINOR (CUSTOM PROCEDURE TRAY) ×2 IMPLANT
PAD ARMBOARD 7.5X6 YLW CONV (MISCELLANEOUS) ×2 IMPLANT
SET BASIN LINEN APH (SET/KITS/TRAYS/PACK) ×2 IMPLANT
SPONGE GAUZE 2X2 8PLY STRL LF (GAUZE/BANDAGES/DRESSINGS) ×4 IMPLANT
STAPLER VISISTAT (STAPLE) ×2 IMPLANT
SUT ETHIBOND NAB MO 7 #0 18IN (SUTURE) ×2 IMPLANT
SUT VIC AB 2-0 CT2 27 (SUTURE) ×2 IMPLANT
SUT VIC AB 3-0 SH 27 (SUTURE) ×1
SUT VIC AB 3-0 SH 27X BRD (SUTURE) ×1 IMPLANT
SUT VICRYL AB 3 0 TIES (SUTURE) IMPLANT
SYR CONTROL 10ML LL (SYRINGE) ×2 IMPLANT
TAPE CLOTH SURG 4X10 WHT LF (GAUZE/BANDAGES/DRESSINGS) ×2 IMPLANT

## 2015-06-02 NOTE — Op Note (Signed)
Patient:  KISHAWN PICKAR  DOB:  January 25, 1960  MRN:  [id number]   Preop Diagnosis:  Umbilical hernia  Postop Diagnosis:  Same  Procedure:  Umbilical herniorrhaphy with mesh  Surgeon:  [name], M.D.  Assistant: Tama High, M.D.  Anes:  Gen.  Indications:  Patient is a 55 year old white male who presents with an umbilical hernia. The risks and benefits of the procedure including bleeding, infection, mesh use, and the possibility of recurrence of the hernia were fully explained to the patient, who gave informed consent.  Procedure note:  The patient was placed the supine position. After general anesthesia was administered, the abdomen was prepped and draped using the usual sterile technique with DuraPrep. Surgical site confirmation was performed.  An infraumbilical incision was made down to the fascia. The umbilicus was freed away from the underlying fascia. The patient was noted to have 2 small hernia defects present, one at the umbilicus and one just superior to this. The fascia was then incised nor to make this all 1 hernia defect. A 6.4 cm Bard ventralax ST patch was then inserted and secured to the fascia using 0 Ethibond interrupted sutures. The overlying fascia was closed transversely using 0 Ethibond interrupted sutures. The base the umbilicus was secured back to the fascia using a 2-0 Vicryl suture. The subcutaneous layer was reapproximated using a 3-0 Vicryl interrupted suture. 0.5% Sensorcaine was instilled into the surrounding wound. The skin was closed using staples. Betadine ointment and a dry sterile dressing were applied.  All tape and needle counts were correct at the end of the procedure. Patient was awakened and transferred to PACU in stable condition.  Complications:  None  EBL:  Minimal  Specimen:  None

## 2015-06-02 NOTE — Anesthesia Procedure Notes (Signed)
Procedure Name: LMA Insertion Date/Time: 06/02/2015 11:30 AM Performed by: Charmaine Downs Pre-anesthesia Checklist: Patient identified, Emergency Drugs available, Suction available and Patient being monitored Patient Re-evaluated:Patient Re-evaluated prior to inductionOxygen Delivery Method: Circle system utilized Preoxygenation: Pre-oxygenation with 100% oxygen Intubation Type: IV induction Ventilation: Mask ventilation without difficulty LMA Size: 4.0 Grade View: Grade II Number of attempts: 1 Placement Confirmation: ETT inserted through vocal cords under direct vision,  positive ETCO2 and breath sounds checked- equal and bilateral Tube secured with: Tape Dental Injury: Teeth and Oropharynx as per pre-operative assessment

## 2015-06-02 NOTE — Anesthesia Preprocedure Evaluation (Signed)
Anesthesia Evaluation  Patient identified by MRN, date of birth, ID band Patient awake    Reviewed: Allergy & Precautions, NPO status , Patient's Chart, lab work & pertinent test results, reviewed documented beta blocker date and time   Airway Mallampati: II  TM Distance: >3 FB     Dental  (+) Poor Dentition, Missing, Chipped, Dental Advisory Given   Pulmonary former smoker,    breath sounds clear to auscultation       Cardiovascular hypertension, Pt. on home beta blockers and Pt. on medications (-) angina+ CAD and + Cardiac Stents   Rhythm:Regular Rate:Normal     Neuro/Psych PSYCHIATRIC DISORDERS Anxiety    GI/Hepatic GERD  Medicated and Controlled,(+)     substance abuse  alcohol use,   Endo/Other    Renal/GU      Musculoskeletal   Abdominal   Peds  Hematology   Anesthesia Other Findings   Reproductive/Obstetrics                             Anesthesia Physical Anesthesia Plan  ASA: III  Anesthesia Plan: General   Post-op Pain Management:    Induction: Intravenous  Airway Management Planned: LMA  Additional Equipment:   Intra-op Plan:   Post-operative Plan: Extubation in OR  Informed Consent: I have reviewed the patients History and Physical, chart, labs and discussed the procedure including the risks, benefits and alternatives for the proposed anesthesia with the patient or authorized representative who has indicated his/her understanding and acceptance.     Plan Discussed with:   Anesthesia Plan Comments:         Anesthesia Quick Evaluation

## 2015-06-02 NOTE — Interval H&P Note (Signed)
History and Physical Interval Note:  06/02/2015 10:16 AM  Vear Clock  has presented today for surgery, with the diagnosis of umbilical hernia  The various methods of treatment have been discussed with the patient and family. After consideration of risks, benefits and other options for treatment, the patient has consented to  Procedure(s): HERNIA REPAIR UMBILICAL ADULT WITH MESH (N/A) as a surgical intervention .  The patient's history has been reviewed, patient examined, no change in status, stable for surgery.  I have reviewed the patient's chart and labs.  Questions were answered to the patient's satisfaction.     [name] A

## 2015-06-02 NOTE — Discharge Instructions (Signed)
Umbilical Herniorrhaphy, Care After Refer to this sheet in the next few weeks. These instructions provide you with information on caring for yourself after your procedure. Your health care provider may also give you more specific instructions. Your treatment has been planned according to current medical practices, but problems sometimes occur. Call your health care provider if you have any problems or questions after your procedure. HOME CARE INSTRUCTIONS  If you are given antibiotic medicine, take it as directed. Finish it even if you start to feel better.  Only take over-the-counter or prescription medicines for pain, fever, or discomfort as directed by your health care provider. Do not take aspirin. It can cause bleeding.  Do not get your surgical cut (incision) area wet unless your health care provider says it is okay.  Avoid lifting objects heavier than 10 lb (4.5 kg) for 8 weeks after surgery.  Avoid sexual activity for 5 weeks after surgery or as directed by your health care provider.  Do not drive while taking prescription pain medicine.  You may return to your other normal, daily activities after 3 days or as directed by your health care provider. SEEK MEDICAL CARE IF:  You notice blood or fluid leaking from the surgical site.  Your incision area becomes red or swollen.  Your pain at the surgical site becomes worse or is not relieved by medicine.  You have problems urinating.  You feel nauseous or vomit more than 2 days after surgery.  You notice the bulge in your abdomen returns after the procedure. SEEK IMMEDIATE MEDICAL CARE IF:  You have a fever.  You have nausea or vomiting that will not stop.   This information is not intended to replace advice given to you by your health care provider. Make sure you discuss any questions you have with your health care provider.   Document Released: 06/20/2011 Document Revised: 01/09/2014 Document Reviewed: 06/20/2011 Elsevier  Interactive Patient Education [employer].

## 2015-06-02 NOTE — Anesthesia Postprocedure Evaluation (Signed)
Anesthesia Post Note  Patient: [name]  Procedure(s) Performed: Procedure(s) (LRB): UMBILICAL HERNIORRHAPHY WITH MESH (N/A) INSERTION OF MESH (N/A)  Patient location during evaluation: PACU Anesthesia Type: General Level of consciousness: awake and alert and patient cooperative Pain management: pain level controlled Vital Signs Assessment: post-procedure vital signs reviewed and stable Respiratory status: spontaneous breathing, nonlabored ventilation and respiratory function stable Cardiovascular status: blood pressure returned to baseline and stable Postop Assessment: no signs of nausea or vomiting Anesthetic complications: no    Last Vitals:  Filed Vitals:   06/02/15 1110 06/02/15 1115  BP: 185/105 165/100  Pulse:    Temp:    Resp: 16 17    Last Pain:  Filed Vitals:   06/02/15 1117  PainSc: 8                  Scout Guyett J

## 2015-06-02 NOTE — Transfer of Care (Signed)
Immediate Anesthesia Transfer of Care Note  Patient: [name]  Procedure(s) Performed: Procedure(s): UMBILICAL HERNIORRHAPHY WITH MESH (N/A) INSERTION OF MESH (N/A)  Patient Location: PACU  Anesthesia Type:General  Level of Consciousness: awake, alert  and patient cooperative  Airway & Oxygen Therapy: Patient Spontanous Breathing and Patient connected to face mask oxygen  Post-op Assessment: Report given to RN, Post -op Vital signs reviewed and stable and Patient moving all extremities  Post vital signs: Reviewed and stable  Last Vitals:  Filed Vitals:   06/02/15 1110 06/02/15 1115  BP: 185/105 165/100  Pulse:    Temp:    Resp: 16 17    Last Pain:  Filed Vitals:   06/02/15 1117  PainSc: 8       Patients Stated Pain Goal: 7 (84/53/64 6803)  Complications: No apparent anesthesia complications

## 2015-06-02 NOTE — Addendum Note (Signed)
Addendum  created 06/02/15 1221 by Charmaine Downs, CRNA   Modules edited: Anesthesia Medication Administration

## 2015-06-03 ENCOUNTER — Encounter (HOSPITAL_COMMUNITY): Payer: Self-pay | Admitting: General Surgery

## 2015-06-12 ENCOUNTER — Other Ambulatory Visit: Payer: Self-pay | Admitting: Cardiovascular Disease

## 2015-07-13 DIAGNOSIS — E785 Hyperlipidemia, unspecified: Secondary | ICD-10-CM | POA: Diagnosis not present

## 2015-07-18 ENCOUNTER — Other Ambulatory Visit: Payer: Self-pay | Admitting: Cardiovascular Disease

## 2015-07-23 ENCOUNTER — Ambulatory Visit (INDEPENDENT_AMBULATORY_CARE_PROVIDER_SITE_OTHER): Payer: BLUE CROSS/BLUE SHIELD | Admitting: Cardiovascular Disease

## 2015-07-23 ENCOUNTER — Encounter: Payer: Self-pay | Admitting: Cardiovascular Disease

## 2015-07-23 VITALS — BP 100/66 | HR 64 | Ht 72.0 in | Wt 187.0 lb

## 2015-07-23 DIAGNOSIS — E785 Hyperlipidemia, unspecified: Secondary | ICD-10-CM

## 2015-07-23 DIAGNOSIS — Z955 Presence of coronary angioplasty implant and graft: Secondary | ICD-10-CM

## 2015-07-23 DIAGNOSIS — I25118 Atherosclerotic heart disease of native coronary artery with other forms of angina pectoris: Secondary | ICD-10-CM | POA: Diagnosis not present

## 2015-07-23 DIAGNOSIS — I10 Essential (primary) hypertension: Secondary | ICD-10-CM | POA: Diagnosis not present

## 2015-07-23 MED ORDER — AMLODIPINE BESYLATE 5 MG PO TABS: 5.0000 mg | ORAL_TABLET | Freq: Every day | ORAL | Status: DC

## 2015-07-23 MED ORDER — ATORVASTATIN CALCIUM 40 MG PO TABS: 40.0000 mg | ORAL_TABLET | Freq: Every evening | ORAL | Status: DC

## 2015-07-23 NOTE — Patient Instructions (Addendum)

## 2015-07-23 NOTE — Progress Notes (Signed)
Patient ID: [name], male   DOB: August 23, 1960, 55 y.o.   MRN: [id number]      SUBJECTIVE: The patient is here to followup for coronary artery disease, hypertension, and hyperlipidemia. He denies chest pain, fatigue, lightheadedness, dizziness, leg swelling, and shortness of breath. He has been staying active and working on fixing a roof.  Review of Systems: As per "subjective", otherwise negative.  No Known Allergies  Current Outpatient Prescriptions  Medication Sig Dispense Refill  . amLODipine (NORVASC) 5 MG tablet TAKE 1 TABLET(5 MG) BY MOUTH DAILY 30 tablet 6  . aspirin 81 MG tablet Take 1 tablet (81 mg total) by mouth daily.    Marland Kitchen atorvastatin (LIPITOR) 40 MG tablet TAKE 1 TABLET BY MOUTH EVERY EVENING 30 tablet 0  . diazepam (VALIUM) 10 MG tablet Take 1 tablet by mouth 3 (three) times daily as needed.  2  . HYDROcodone-acetaminophen (NORCO) 10-325 MG tablet Take 1 tablet by mouth every 4 (four) hours as needed. 50 tablet 0  . lisinopril (PRINIVIL,ZESTRIL) 20 MG tablet Take 20 mg by mouth 2 (two) times daily.    . metoprolol tartrate (LOPRESSOR) 25 MG tablet TAKE 1/2 TABLET BY MOUTH TWICE DAILY 30 tablet 3  . nitroGLYCERIN (NITROSTAT) 0.4 MG SL tablet Place 1 tablet (0.4 mg total) under the tongue every 5 (five) minutes as needed for chest pain (CP or SOB). 25 tablet 3   No current facility-administered medications for this visit.    Past Medical History  Diagnosis Date  . Hypertension   . Anxiety   . Hypercholesteremia   . Coronary artery disease     a. Diag cath 10/2012 for CP/abnormal nuc -> planned PCI s/p LAD atherectomy/DES placement 11/05/12.  Marland Kitchen GERD (gastroesophageal reflux disease)   . 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 Bradycardia     a. During 11/2012 adm: lopressor decreased.  . Arthritis     Past Surgical History  Procedure Laterality Date  . Hemorrhoid surgery  ~ 2010    . Cardiac catheterization  10/29/2012  . Coronary angioplasty with stent placement  11/05/2012  . Left heart catheterization with coronary angiogram N/A 10/30/2012    Procedure: LEFT HEART CATHETERIZATION WITH CORONARY ANGIOGRAM;  Surgeon: [name], MD;  Location: [place] CATH [facility];  Service: Cardiovascular;  Laterality: N/A;  . Percutaneous coronary rotoblator intervention (pci-r) N/A 11/05/2012    Procedure: PERCUTANEOUS CORONARY ROTOBLATOR INTERVENTION (PCI-R);  Surgeon: [name], MD;  Location: [facility] CATH LAB;  Service: Cardiovascular;  Laterality: N/A;  . Umbilical hernia repair N/A 06/02/2015    Procedure: UMBILICAL HERNIORRHAPHY WITH MESH;  Surgeon: [name], MD;  Location: [facility];  Service: General;  Laterality: N/A;  . Insertion of mesh N/A 06/02/2015    Procedure: INSERTION OF MESH;  Surgeon: [name], MD;  Location: [facility] ORS;  Service: General;  Laterality: N/A;    Social History   Social History  . Marital Status: Married    Spouse Name: N/A  . Number of Children: N/A  . Years of Education: N/A   Occupational History  . Not on file.   Social History Main Topics  . Smoking status: Former Smoker -- 3.00 packs/day for 32 years    Types: Cigarettes    Start date: 08/29/1977    Quit date: 11/29/2006  . Smokeless tobacco: Former Systems developer    Types: Loss adjuster, chartered  Comment: 11/05/2012 "chewed tobacco when I play ball; aien't chewed since age 3"  . Alcohol Use: 0.0 oz/week    0 Standard drinks or equivalent per week     Comment: few beers every day  . Drug Use: No  . Sexual Activity: Not Currently   Other Topics Concern  . Not on file   Social History Narrative     Filed Vitals:   07/23/15 1440  BP: 100/66  Pulse: 64  Height: 6' (1.829 m)  Weight: 187 lb (84.823 kg)    PHYSICAL EXAM General: NAD HEENT: Normal. Neck: No JVD, no thyromegaly. Lungs: Clear to auscultation bilaterally with normal respiratory effort. CV: Nondisplaced PMI.  Regular rate and  rhythm, normal S1/S2, no S3/S4, no murmur. No pretibial or periankle edema.  No carotid bruit.   Abdomen: Soft, nontender, no distention.  Neurologic: Alert and oriented.  Psych: Normal affect. Skin: Normal. Musculoskeletal: No gross deformities.    ECG: Most recent ECG reviewed.      ASSESSMENT AND PLAN: 1. CAD s/p LAD atherectomy and DES placement on 11/05/12: Symptomatically stable. Continue ASA, atorvastatin, and metoprolol.   2. Essential hypertension: Controlled. No changes.  3. Hyperlipidemia: Lipids 07/13/15 total cholesterol 149, triglycerides 137, HDL 66, LDL 56. Continue atorvastatin 40 mg. Will refill.  Dispo: f/u 1 year.   Kate Sable, M.D., F.A.C.C.

## 2015-07-29 DIAGNOSIS — Z1389 Encounter for screening for other disorder: Secondary | ICD-10-CM | POA: Diagnosis not present

## 2015-07-29 DIAGNOSIS — G894 Chronic pain syndrome: Secondary | ICD-10-CM | POA: Diagnosis not present

## 2015-07-29 DIAGNOSIS — Z6825 Body mass index (BMI) 25.0-25.9, adult: Secondary | ICD-10-CM | POA: Diagnosis not present

## 2015-07-29 DIAGNOSIS — E663 Overweight: Secondary | ICD-10-CM | POA: Diagnosis not present

## 2015-07-29 DIAGNOSIS — M47816 Spondylosis without myelopathy or radiculopathy, lumbar region: Secondary | ICD-10-CM | POA: Diagnosis not present

## 2016-02-17 ENCOUNTER — Other Ambulatory Visit: Payer: Self-pay | Admitting: Cardiovascular Disease

## 2016-02-23 DIAGNOSIS — F1120 Opioid dependence, uncomplicated: Secondary | ICD-10-CM | POA: Diagnosis not present

## 2016-02-23 DIAGNOSIS — N400 Enlarged prostate without lower urinary tract symptoms: Secondary | ICD-10-CM | POA: Diagnosis not present

## 2016-02-23 DIAGNOSIS — Z1389 Encounter for screening for other disorder: Secondary | ICD-10-CM | POA: Diagnosis not present

## 2016-02-23 DIAGNOSIS — Z6825 Body mass index (BMI) 25.0-25.9, adult: Secondary | ICD-10-CM | POA: Diagnosis not present

## 2016-02-23 DIAGNOSIS — M545 Low back pain: Secondary | ICD-10-CM | POA: Diagnosis not present

## 2016-02-23 DIAGNOSIS — G894 Chronic pain syndrome: Secondary | ICD-10-CM | POA: Diagnosis not present

## 2016-06-02 DIAGNOSIS — Z1389 Encounter for screening for other disorder: Secondary | ICD-10-CM | POA: Diagnosis not present

## 2016-06-02 DIAGNOSIS — M62830 Muscle spasm of back: Secondary | ICD-10-CM | POA: Diagnosis not present

## 2016-06-02 DIAGNOSIS — Z6825 Body mass index (BMI) 25.0-25.9, adult: Secondary | ICD-10-CM | POA: Diagnosis not present

## 2016-06-02 DIAGNOSIS — G894 Chronic pain syndrome: Secondary | ICD-10-CM | POA: Diagnosis not present

## 2016-06-02 DIAGNOSIS — N400 Enlarged prostate without lower urinary tract symptoms: Secondary | ICD-10-CM | POA: Diagnosis not present

## 2016-06-02 DIAGNOSIS — M47816 Spondylosis without myelopathy or radiculopathy, lumbar region: Secondary | ICD-10-CM | POA: Diagnosis not present

## 2016-06-02 DIAGNOSIS — F1120 Opioid dependence, uncomplicated: Secondary | ICD-10-CM | POA: Diagnosis not present

## 2016-06-22 DIAGNOSIS — Z1211 Encounter for screening for malignant neoplasm of colon: Secondary | ICD-10-CM | POA: Diagnosis not present

## 2016-06-28 DIAGNOSIS — K219 Gastro-esophageal reflux disease without esophagitis: Secondary | ICD-10-CM | POA: Diagnosis not present

## 2016-06-28 DIAGNOSIS — I10 Essential (primary) hypertension: Secondary | ICD-10-CM | POA: Diagnosis not present

## 2016-06-28 DIAGNOSIS — Z6825 Body mass index (BMI) 25.0-25.9, adult: Secondary | ICD-10-CM | POA: Diagnosis not present

## 2016-06-28 DIAGNOSIS — Z0001 Encounter for general adult medical examination with abnormal findings: Secondary | ICD-10-CM | POA: Diagnosis not present

## 2016-06-28 DIAGNOSIS — Z1389 Encounter for screening for other disorder: Secondary | ICD-10-CM | POA: Diagnosis not present

## 2016-06-28 DIAGNOSIS — G5702 Lesion of sciatic nerve, left lower limb: Secondary | ICD-10-CM | POA: Diagnosis not present

## 2016-07-20 ENCOUNTER — Encounter (HOSPITAL_COMMUNITY): Payer: Self-pay | Admitting: Emergency Medicine

## 2016-07-20 ENCOUNTER — Emergency Department (HOSPITAL_COMMUNITY): Payer: BLUE CROSS/BLUE SHIELD

## 2016-07-20 ENCOUNTER — Emergency Department (HOSPITAL_COMMUNITY)
Admission: EM | Admit: 2016-07-20 | Discharge: 2016-07-20 | Disposition: A | Discharge status: Home / Self Care | Payer: BLUE CROSS/BLUE SHIELD | Attending: Emergency Medicine | Admitting: Emergency Medicine

## 2016-07-20 DIAGNOSIS — M62838 Other muscle spasm: Secondary | ICD-10-CM | POA: Diagnosis not present

## 2016-07-20 DIAGNOSIS — I2510 Atherosclerotic heart disease of native coronary artery without angina pectoris: Secondary | ICD-10-CM | POA: Insufficient documentation

## 2016-07-20 DIAGNOSIS — M79605 Pain in left leg: Secondary | ICD-10-CM | POA: Diagnosis not present

## 2016-07-20 DIAGNOSIS — M5416 Radiculopathy, lumbar region: Secondary | ICD-10-CM | POA: Diagnosis not present

## 2016-07-20 DIAGNOSIS — Z79899 Other long term (current) drug therapy: Secondary | ICD-10-CM | POA: Insufficient documentation

## 2016-07-20 DIAGNOSIS — Z79891 Long term (current) use of opiate analgesic: Secondary | ICD-10-CM | POA: Diagnosis not present

## 2016-07-20 DIAGNOSIS — M79662 Pain in left lower leg: Secondary | ICD-10-CM | POA: Diagnosis not present

## 2016-07-20 DIAGNOSIS — Z87891 Personal history of nicotine dependence: Secondary | ICD-10-CM | POA: Insufficient documentation

## 2016-07-20 DIAGNOSIS — I119 Hypertensive heart disease without heart failure: Secondary | ICD-10-CM | POA: Diagnosis not present

## 2016-07-20 DIAGNOSIS — M545 Low back pain: Secondary | ICD-10-CM | POA: Diagnosis not present

## 2016-07-20 DIAGNOSIS — S3992XA Unspecified injury of lower back, initial encounter: Secondary | ICD-10-CM | POA: Diagnosis not present

## 2016-07-20 LAB — I-STAT CHEM 8, ED
BUN: 16 mg/dL (ref 6–20)
Calcium, Ion: 1.1 mmol/L — ABNORMAL LOW (ref 1.15–1.40)
Chloride: 100 mmol/L — ABNORMAL LOW (ref 101–111)
Creatinine, Ser: 1.1 mg/dL (ref 0.61–1.24)
Glucose, Bld: 95 mg/dL (ref 65–99)
HCT: 39 % (ref 39.0–52.0)
Hemoglobin: 13.3 g/dL (ref 13.0–17.0)
Potassium: 4.9 mmol/L (ref 3.5–5.1)
Sodium: 132 mmol/L — ABNORMAL LOW (ref 135–145)
TCO2: 22 mmol/L (ref 0–100)

## 2016-07-20 LAB — D-DIMER, QUANTITATIVE: D-Dimer, Quant: 0.68 ug/mL-FEU — ABNORMAL HIGH (ref 0.00–0.50)

## 2016-07-20 MED ORDER — KETOROLAC TROMETHAMINE 60 MG/2ML IM SOLN
60.0000 mg | Freq: Once | INTRAMUSCULAR | Status: AC
  Administered 2016-07-20: 60 mg via INTRAMUSCULAR
  Filled 2016-07-20: qty 2

## 2016-07-20 MED ORDER — METHOCARBAMOL 500 MG PO TABS: 1000.0000 mg | ORAL_TABLET | Freq: Four times a day (QID) | ORAL | 0 refills | Status: AC

## 2016-07-20 MED ORDER — IBUPROFEN 600 MG PO TABS: 600.0000 mg | ORAL_TABLET | Freq: Four times a day (QID) | ORAL | 0 refills | Status: DC | PRN

## 2016-07-20 MED ORDER — HYDROMORPHONE HCL 1 MG/ML IJ SOLN
0.5000 mg | Freq: Once | INTRAMUSCULAR | Status: AC
  Administered 2016-07-20: 0.5 mg via INTRAMUSCULAR
  Filled 2016-07-20: qty 1

## 2016-07-20 MED ORDER — OXYCODONE-ACETAMINOPHEN 5-325 MG PO TABS
2.0000 | ORAL_TABLET | Freq: Once | ORAL | Status: AC
  Administered 2016-07-20: 2 via ORAL
  Filled 2016-07-20: qty 2

## 2016-07-20 MED ORDER — OXYCODONE-ACETAMINOPHEN 5-325 MG PO TABS: 1.0000 | ORAL_TABLET | ORAL | 0 refills | Status: DC | PRN

## 2016-07-20 MED ORDER — METHOCARBAMOL 500 MG PO TABS
500.0000 mg | ORAL_TABLET | Freq: Once | ORAL | Status: AC
  Administered 2016-07-20: 500 mg via ORAL
  Filled 2016-07-20: qty 1

## 2016-07-20 MED ORDER — POTASSIUM CHLORIDE CRYS ER 20 MEQ PO TBCR: 20.0000 meq | EXTENDED_RELEASE_TABLET | Freq: Once | ORAL | Status: DC

## 2016-07-20 NOTE — ED Notes (Signed)
Patient transported to X-ray 

## 2016-07-20 NOTE — ED Notes (Signed)
Pt returned from US

## 2016-07-20 NOTE — Discharge Instructions (Signed)
You have been prescribed robaxin for muscle spasm, oxycodone for pain and ibuprofen to help with inflammation in your back.  Do not drive within 4 hours of taking oxycodone as this will make you drowsy.  Avoid lifting,  Bending,  Twisting or any other activity that worsens your pain over the next week.  Apply a heating pad  to your lower back for 20 minutes 3-4 times daily for the next week.  You should get rechecked if your symptoms are not better over the next 7 days,  Or you develop increased pain,  Weakness in your leg(s) or loss of bladder or bowel function - these are symptoms of a worsening injury.

## 2016-07-20 NOTE — ED Triage Notes (Signed)
Pt fell off ladder x  One month ago. Seen dr Gerarda Fraction and wanted MRI but pt insurance would not pay. States pain to left buttocks radiating down leg with feet going numb is getting worse. Pt limping.

## 2016-07-22 NOTE — ED Provider Notes (Signed)
[facility] DEPT Provider Note   CSN: [id number] Arrival date & time: 07/20/16  0905     History   Chief Complaint Chief Complaint  Patient presents with  . Leg Pain    HPI [name] is a 56 y.o. male with a history of arthritis, cad, gerd, htn and chronic low back pain with worsened pain since falling from a ladder, approximately 6 foot fall one month ago.  He has new pain which radiates through his left buttock to his toes, with occasional numbness in this foot which is worsening.  He also notes having a tight sensation in his left calf, intermittent "charley horses" in the calf and pain in the  posterior knee which radiates into the lower posterior thigh which has worsened over the past week.  He reports swelling of the foot if stands too long.  Has a sister with h/o pulmonary embolism and is concerned for possible blood clot.  He denies chest pain, sob and also denies weakness in his legs, urinary or fecal incontinence or retention. He takes hydrocodone  without relief of pain.  The history is provided by the patient.    Past Medical History:  Diagnosis Date  . Anxiety   . Arthritis   . Bradycardia    a. During 11/2012 adm: lopressor decreased.  . 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 Coronary artery disease    a. Diag cath 10/2012 for CP/abnormal nuc -> planned PCI s/p LAD atherectomy/DES placement 11/05/12.  Marland Kitchen GERD (gastroesophageal reflux disease)   . Hypercholesteremia   . Hypertension     Patient Active Problem List   Diagnosis Date Noted  . Rotator cuff syndrome of right shoulder 02/20/2013  . Bradycardia 11/06/2012  . Coronary artery disease   . Abnormal finding on cardiovascular stress test 09/25/2012  . Hypertension 09/03/2012  . Chest pain 09/03/2012  . Alcohol abuse, daily use 09/03/2012  . Hyperlipidemia 09/03/2012  . Hyponatremia 09/03/2012    Past Surgical History:   Procedure Laterality Date  . CARDIAC CATHETERIZATION  10/29/2012  . CORONARY ANGIOPLASTY WITH STENT PLACEMENT  11/05/2012  . HEMORRHOID SURGERY  ~ 2010  . INSERTION OF MESH N/A 06/02/2015   Procedure: INSERTION OF MESH;  Surgeon: [name], MD;  Location: [facility];  Service: General;  Laterality: N/A;  . LEFT HEART CATHETERIZATION WITH CORONARY ANGIOGRAM N/A 10/30/2012   Procedure: LEFT HEART CATHETERIZATION WITH CORONARY ANGIOGRAM;  Surgeon: [name], MD;  Location: [facility] CATH [facility];  Service: Cardiovascular;  Laterality: N/A;  . PERCUTANEOUS CORONARY ROTOBLATOR INTERVENTION (PCI-R) N/A 11/05/2012   Procedure: PERCUTANEOUS CORONARY ROTOBLATOR INTERVENTION (PCI-R);  Surgeon: [name], MD;  Location: [facility] CATH LAB;  Service: Cardiovascular;  Laterality: N/A;  . UMBILICAL HERNIA REPAIR N/A 06/02/2015   Procedure: UMBILICAL HERNIORRHAPHY WITH MESH;  Surgeon: [name], MD;  Location: [facility];  Service: General;  Laterality: N/A;       Home Medications    Prior to Admission medications   Medication Sig Start Date End Date Taking? Authorizing Provider  amLODipine (NORVASC) 5 MG tablet Take 1 tablet (5 mg total) by mouth daily. 07/23/15   Herminio Commons, MD  aspirin 81 MG tablet Take 1 tablet (81 mg total) by mouth daily. 11/06/12   Dunn, Nedra Hai, PA-C  atorvastatin (LIPITOR) 40 MG tablet Take 1 tablet (40 mg total) by mouth every evening. 07/23/15  Herminio Commons, MD  diazepam (VALIUM) 10 MG tablet Take 1 tablet by mouth 3 (three) times daily as needed. 05/16/15   [provider]  HYDROcodone-acetaminophen (NORCO) 10-325 MG tablet Take 1 tablet by mouth every 4 (four) hours as needed. 06/02/15   [name], MD  ibuprofen (ADVIL,MOTRIN) 600 MG tablet Take 1 tablet (600 mg total) by mouth every 6 (six) hours as needed. 07/20/16   Evalee Jefferson, PA-C  lisinopril (PRINIVIL,ZESTRIL) 20 MG tablet Take 20 mg by mouth 2 (two) times daily.    [provider]    methocarbamol (ROBAXIN) 500 MG tablet Take 2 tablets (1,000 mg total) by mouth 4 (four) times daily. 07/20/16 07/30/16  Evalee Jefferson, PA-C  metoprolol tartrate (LOPRESSOR) 25 MG tablet TAKE 1/2 TABLET BY MOUTH TWICE DAILY 01/25/15   Herminio Commons, MD  metoprolol tartrate (LOPRESSOR) 25 MG tablet TAKE 1/2 TABLET BY MOUTH TWICE DAILY 02/17/16   Satira Sark, MD  nitroGLYCERIN (NITROSTAT) 0.4 MG SL tablet Place 1 tablet (0.4 mg total) under the tongue every 5 (five) minutes as needed for chest pain (CP or SOB). 07/27/14   Herminio Commons, MD  oxyCODONE-acetaminophen (PERCOCET/ROXICET) 5-325 MG tablet Take 1 tablet by mouth every 4 (four) hours as needed. 07/20/16   Evalee Jefferson, PA-C    Family History Family History  Problem Relation Age of Onset  . Heart disease Father   . Heart disease Sister     Social History Social History  Substance Use Topics  . Smoking status: Former Smoker    Packs/day: 3.00    Years: 32.00    Types: Cigarettes    Start date: 08/29/1977    Quit date: 11/29/2006  . Smokeless tobacco: Former Systems developer    Types: Chew     Comment: 11/05/2012 "chewed tobacco when I play ball; aien't chewed since age 67"  . Alcohol use 0.0 oz/week     Comment: few beers every day     Allergies   Prednisone   Review of Systems Review of Systems  Constitutional: Negative for fever.  Respiratory: Negative for shortness of breath.   Cardiovascular: Positive for leg swelling. Negative for chest pain.  Gastrointestinal: Negative for abdominal distention, abdominal pain and constipation.  Genitourinary: Negative for difficulty urinating, dysuria, flank pain, frequency and urgency.  Musculoskeletal: Positive for arthralgias, back pain and myalgias. Negative for gait problem and joint swelling.  Skin: Negative for color change and rash.  Neurological: Positive for numbness. Negative for weakness.     Physical Exam Updated Vital Signs BP (!) 163/89   Pulse (!) 57   Temp  97.8 F (36.6 C) (Oral)   Resp 18   Ht 6' (1.829 m)   Wt 83.9 kg (185 lb)   SpO2 96%   BMI 25.09 kg/m   Physical Exam  Constitutional: He appears well-developed and well-nourished.  HENT:  Head: Normocephalic.  Eyes: Conjunctivae are normal.  Neck: Normal range of motion. Neck supple.  Cardiovascular: Normal rate and intact distal pulses.   Pulses:      Dorsalis pedis pulses are 2+ on the right side, and 2+ on the left side.  Pedal pulses normal.  Pulmonary/Chest: Effort normal.  Abdominal: Soft. Bowel sounds are normal. He exhibits no distension and no mass.  Musculoskeletal: He exhibits no edema.       Lumbar back: He exhibits tenderness and bony tenderness. He exhibits no swelling, no edema and no spasm.  No foot or lower extremity edema.  TTP  posterior calf. No palpable cords. Positive Homan's.  Neurological: He is alert. He has normal strength. He displays no atrophy and no tremor. No sensory deficit. Gait normal.  Reflex Scores:      Patellar reflexes are 2+ on the right side and 2+ on the left side.      Achilles reflexes are 2+ on the right side and 2+ on the left side. No strength deficit noted in hip and knee flexor and extensor muscle groups.  Ankle flexion and extension intact.  Skin: Skin is warm and dry.  Psychiatric: He has a normal mood and affect.  Nursing note and vitals reviewed.    ED Treatments / Results  Labs (all labs ordered are listed, but only abnormal results are displayed) Labs Reviewed  D-DIMER, QUANTITATIVE (NOT AT [facility]) - Abnormal; Notable for the following:       Result Value   D-Dimer, Quant 0.68 (*)    All other components within normal limits  I-STAT CHEM 8, ED - Abnormal; Notable for the following:    Sodium 132 (*)    Chloride 100 (*)    Calcium, Ion 1.10 (*)    All other components within normal limits    EKG  EKG Interpretation None       Radiology Dg Lumbar Spine Complete  Result Date: 07/20/2016 CLINICAL DATA:  Fall  from ladder 1 month ago with persistent back pain, initial encounter EXAM: LUMBAR SPINE - COMPLETE 4+ VIEW COMPARISON:  09/11/2008 FINDINGS: Five lumbar type vertebral bodies are well visualized. Vertebral body height is well maintained. No pars defects are seen. No anterolisthesis is noted. Mild osteophytic changes and facet hypertrophic changes are noted. Disc space narrowing is seen at L4-5 and L5-S1. IMPRESSION: Multilevel degenerative change without acute abnormality. Electronically Signed   By: Inez Catalina M.D.   On: 07/20/2016 11:42   US Venous Img Lower Unilateral Left  Result Date: 07/20/2016 CLINICAL DATA:  56 year old who fell off of a ladder approximately 1 month ago, presenting with gradually worsening left lower extremity pain from the hip to the foot. EXAM: LEFT LOWER EXTREMITY VENOUS DOPPLER ULTRASOUND TECHNIQUE: Gray-scale sonography with graded compression, as well as color Doppler and duplex ultrasound were performed to evaluate the lower extremity deep venous systems from the level of the common femoral vein and including the common femoral, femoral, profunda femoral, popliteal and calf veins including the posterior tibial, peroneal and gastrocnemius veins when visible. The superficial great saphenous vein was also interrogated. Spectral Doppler was utilized to evaluate flow at rest and with distal augmentation maneuvers in the common femoral, femoral and popliteal veins. COMPARISON:  None. FINDINGS: Contralateral Right Common Femoral Vein: Respiratory phasicity is normal and symmetric with the symptomatic side. No evidence of thrombus. Normal compressibility. Left Common Femoral Vein: No evidence of thrombus. Normal compressibility, respiratory phasicity and response to augmentation. Saphenofemoral Junction: No evidence of thrombus. Normal compressibility and flow on color Doppler imaging. Profunda Femoral Vein: No evidence of thrombus. Normal compressibility and flow on color Doppler  imaging. Femoral Vein: No evidence of thrombus. Normal compressibility, respiratory phasicity and response to augmentation. Popliteal Vein: No evidence of thrombus. Normal compressibility, respiratory phasicity and response to augmentation. Calf Veins: No evidence of thrombus. Normal compressibility and flow on color Doppler imaging. Superficial Great Saphenous Vein: No evidence of thrombus. Normal compressibility and flow on color Doppler imaging. Venous Reflux:  Not evaluated. Other Findings: No evidence of intramuscular hematoma at the site of maximum pain in the left lateral  thigh. IMPRESSION: No evidence of DVT involving the left lower extremity. Electronically Signed   By: Evangeline Dakin M.D.   On: 07/20/2016 13:28    Procedures Procedures (including critical care time)  Medications Ordered in ED Medications  ketorolac (TORADOL) injection 60 mg (60 mg Intramuscular Given 07/20/16 1128)  oxyCODONE-acetaminophen (PERCOCET/ROXICET) 5-325 MG per tablet 2 tablet (2 tablets Oral Given 07/20/16 1128)  methocarbamol (ROBAXIN) tablet 500 mg (500 mg Oral Given 07/20/16 1341)  HYDROmorphone (DILAUDID) injection 0.5 mg (0.5 mg Intramuscular Given 07/20/16 1342)     Initial Impression / Assessment and Plan / ED Course  I have reviewed the triage vital signs and the nursing notes.  Pertinent labs & imaging results that were available during my care of the patient were reviewed by me and considered in my medical decision making (see chart for details).     Pt given toradol and oxycodone with no improvement in pain.  Added dilaudid IM with relief optained.  No neuro deficit on exam or by history to suggest emergent or surgical presentation.  No dvt per [place].  Discussed worsened sx that should prompt immediate re-evaluation including distal weakness, bowel/bladder retention/incontinence.  Pt's pain med increased to oxycodone for a few days, added robaxin due to extremity muscle spasm, ibuprofen, discussed  heat tx.  Plan f/u with pcp for a recheck if not improved x 1 week.  [place] controlled substance database reviewed.         Final Clinical Impressions(s) / ED Diagnoses   Final diagnoses:  Lumbar radiculopathy  Muscle spasm of left lower extremity    New Prescriptions Discharge Medication List as of 07/20/2016  1:39 PM    START taking these medications   Details  ibuprofen (ADVIL,MOTRIN) 600 MG tablet Take 1 tablet (600 mg total) by mouth every 6 (six) hours as needed., Starting Thu 07/20/2016, Print    methocarbamol (ROBAXIN) 500 MG tablet Take 2 tablets (1,000 mg total) by mouth 4 (four) times daily., Starting Thu 07/20/2016, Until Sun 07/30/2016, Print    oxyCODONE-acetaminophen (PERCOCET/ROXICET) 5-325 MG tablet Take 1 tablet by mouth every 4 (four) hours as needed., Starting Thu 07/20/2016, Print         Evalee Jefferson, PA-C 07/22/16 [place], [place], DO 07/24/16 1821

## 2016-08-01 ENCOUNTER — Encounter: Payer: Self-pay | Admitting: Cardiovascular Disease

## 2016-08-01 ENCOUNTER — Ambulatory Visit (INDEPENDENT_AMBULATORY_CARE_PROVIDER_SITE_OTHER): Payer: BLUE CROSS/BLUE SHIELD | Admitting: Cardiovascular Disease

## 2016-08-01 VITALS — BP 126/76 | HR 61 | Ht 72.0 in | Wt 181.0 lb

## 2016-08-01 DIAGNOSIS — Z955 Presence of coronary angioplasty implant and graft: Secondary | ICD-10-CM

## 2016-08-01 DIAGNOSIS — I25118 Atherosclerotic heart disease of native coronary artery with other forms of angina pectoris: Secondary | ICD-10-CM | POA: Diagnosis not present

## 2016-08-01 DIAGNOSIS — I10 Essential (primary) hypertension: Secondary | ICD-10-CM | POA: Diagnosis not present

## 2016-08-01 DIAGNOSIS — E7800 Pure hypercholesterolemia, unspecified: Secondary | ICD-10-CM | POA: Diagnosis not present

## 2016-08-01 NOTE — Addendum Note (Signed)
Addended by: Laurine Blazer on: 08/01/2016 03:30 PM   Modules accepted: Orders

## 2016-08-01 NOTE — Progress Notes (Signed)
SUBJECTIVE: The patient is here to followup for coronary artery disease, hypertension, and hyperlipidemia.   ECG performed in the office today which I ordered personally interpreted demonstrated sinus bradycardia, 54 bpm.  He denies exertional chest pain and shortness of breath.  He had a fall from a 10 foot ladder several weeks ago. He was evaluated in the ED on 07/20/16 for left leg pain. Lower extremity Dopplers were negative for DVT. Lumbar x-ray showed multilevel degenerative changes with no acute abnormalities.   Review of Systems: As per "subjective", otherwise negative.  Allergies  Allergen Reactions  . Prednisone Other (See Comments)    Chest pain    Current Outpatient Prescriptions  Medication Sig Dispense Refill  . amLODipine (NORVASC) 5 MG tablet Take 1 tablet (5 mg total) by mouth daily. 30 tablet 11  . aspirin 81 MG tablet Take 1 tablet (81 mg total) by mouth daily.    Marland Kitchen atorvastatin (LIPITOR) 40 MG tablet Take 1 tablet (40 mg total) by mouth every evening. 30 tablet 11  . diazepam (VALIUM) 10 MG tablet Take 1 tablet by mouth 3 (three) times daily as needed.  2  . HYDROcodone-acetaminophen (NORCO) 10-325 MG tablet Take 1 tablet by mouth every 4 (four) hours as needed. 50 tablet 0  . ibuprofen (ADVIL,MOTRIN) 600 MG tablet Take 1 tablet (600 mg total) by mouth every 6 (six) hours as needed. 30 tablet 0  . lisinopril (PRINIVIL,ZESTRIL) 20 MG tablet Take 20 mg by mouth 2 (two) times daily.    . metoprolol tartrate (LOPRESSOR) 25 MG tablet TAKE 1/2 TABLET BY MOUTH TWICE DAILY 30 tablet 3  . metoprolol tartrate (LOPRESSOR) 25 MG tablet TAKE 1/2 TABLET BY MOUTH TWICE DAILY 30 tablet 6  . nitroGLYCERIN (NITROSTAT) 0.4 MG SL tablet Place 1 tablet (0.4 mg total) under the tongue every 5 (five) minutes as needed for chest pain (CP or SOB). 25 tablet 3  . oxyCODONE-acetaminophen (PERCOCET/ROXICET) 5-325 MG tablet Take 1 tablet by mouth every 4 (four) hours as needed. 20 tablet  0   No current facility-administered medications for this visit.     Past Medical History:  Diagnosis Date  . Anxiety   . Arthritis   . Bradycardia    a. During 11/2012 adm: lopressor decreased.  . 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 Coronary artery disease    a. Diag cath 10/2012 for CP/abnormal nuc -> planned PCI s/p LAD atherectomy/DES placement 11/05/12.  Marland Kitchen GERD (gastroesophageal reflux disease)   . Hypercholesteremia   . Hypertension     Past Surgical History:  Procedure Laterality Date  . CARDIAC CATHETERIZATION  10/29/2012  . CORONARY ANGIOPLASTY WITH STENT PLACEMENT  11/05/2012  . HEMORRHOID SURGERY  ~ 2010  . INSERTION OF MESH N/A 06/02/2015   Procedure: INSERTION OF MESH;  Surgeon: [name], MD;  Location: [facility];  Service: General;  Laterality: N/A;  . LEFT HEART CATHETERIZATION WITH CORONARY ANGIOGRAM N/A 10/30/2012   Procedure: LEFT HEART CATHETERIZATION WITH CORONARY ANGIOGRAM;  Surgeon: [name], MD;  Location: [facility] CATH LAB;  Service: Cardiovascular;  Laterality: N/A;  . PERCUTANEOUS CORONARY ROTOBLATOR INTERVENTION (PCI-R) N/A 11/05/2012   Procedure: PERCUTANEOUS CORONARY ROTOBLATOR INTERVENTION (PCI-R);  Surgeon: [name], MD;  Location: [facility] CATH LAB;  Service: Cardiovascular;  Laterality: N/A;  . UMBILICAL HERNIA REPAIR N/A 06/02/2015   Procedure: UMBILICAL HERNIORRHAPHY WITH MESH;  Surgeon: [name], MD;  Location: [facility];  Service: General;  Laterality: N/A;    Social History   Social History  . Marital status: Married    Spouse name: N/A  . Number of children: N/A  . Years of education: N/A   Occupational History  . Not on file.   Social History Main Topics  . Smoking status: Former Smoker    Packs/day: 3.00    Years: 32.00    Types: Cigarettes    Start date: 08/29/1977    Quit date: 11/29/2006  . Smokeless tobacco: Former Systems developer    Types: Chew      Comment: 11/05/2012 "chewed tobacco when I play ball; aien't chewed since age 68"  . Alcohol use 0.0 oz/week     Comment: few beers every day  . Drug use: No  . Sexual activity: Not Currently   Other Topics Concern  . Not on file   Social History Narrative  . No narrative on file     Vitals:   08/01/16 1514  BP: 126/76  Pulse: 61  SpO2: 96%  Weight: 181 lb (82.1 kg)  Height: 6' (1.829 m)    Wt Readings from Last 3 Encounters:  08/01/16 181 lb (82.1 kg)  07/20/16 185 lb (83.9 kg)  07/23/15 187 lb (84.8 kg)     PHYSICAL EXAM General: NAD HEENT: Poor dentition. Neck: No JVD, no thyromegaly. Lungs: Clear to auscultation bilaterally with normal respiratory effort. CV: Nondisplaced PMI.  Regular rate and rhythm, normal S1/S2, no S3/S4, no murmur. No pretibial or periankle edema.  No carotid bruit.   Abdomen: Soft, nontender, no distention.  Neurologic: Alert and oriented.  Psych: Normal affect. Skin: Normal. Musculoskeletal: No gross deformities.    ECG: Most recent ECG reviewed.   Labs: Lab Results  Component Value Date/Time   K 4.9 07/20/2016 11:35 AM   BUN 16 07/20/2016 11:35 AM   CREATININE 1.10 07/20/2016 11:35 AM   CREATININE 1.23 10/28/2012 03:58 PM   ALT 16 08/18/2013 09:58 AM   HGB 13.3 07/20/2016 11:35 AM     Lipids: Lab Results  Component Value Date/Time   LDLCALC 65 08/18/2013 09:58 AM   CHOL 156 08/18/2013 09:58 AM   TRIG 114 08/18/2013 09:58 AM   HDL 68 08/18/2013 09:58 AM       ASSESSMENT AND PLAN:  1. CAD s/p LAD atherectomy and DES placement on 11/05/12: Symptomatically stable. Continue ASA, atorvastatin, and metoprolol.   2. Essential hypertension: Controlled. No changes.  3. Hyperlipidemia: Continue atorvastatin 40 mg.     Disposition: Follow up 1 yr   Kate Sable, M.D., F.A.C.C.

## 2016-08-01 NOTE — Patient Instructions (Signed)

## 2016-08-23 DIAGNOSIS — Z1389 Encounter for screening for other disorder: Secondary | ICD-10-CM | POA: Diagnosis not present

## 2016-08-23 DIAGNOSIS — Z6825 Body mass index (BMI) 25.0-25.9, adult: Secondary | ICD-10-CM | POA: Diagnosis not present

## 2016-08-23 DIAGNOSIS — M1388 Other specified arthritis, other site: Secondary | ICD-10-CM | POA: Diagnosis not present

## 2016-08-23 DIAGNOSIS — F419 Anxiety disorder, unspecified: Secondary | ICD-10-CM | POA: Diagnosis not present

## 2016-08-23 DIAGNOSIS — G894 Chronic pain syndrome: Secondary | ICD-10-CM | POA: Diagnosis not present

## 2016-08-23 DIAGNOSIS — I10 Essential (primary) hypertension: Secondary | ICD-10-CM | POA: Diagnosis not present

## 2016-10-08 ENCOUNTER — Other Ambulatory Visit: Payer: Self-pay | Admitting: Cardiovascular Disease

## 2016-10-08 DIAGNOSIS — E785 Hyperlipidemia, unspecified: Secondary | ICD-10-CM

## 2016-10-08 DIAGNOSIS — I10 Essential (primary) hypertension: Secondary | ICD-10-CM

## 2016-10-08 DIAGNOSIS — Z955 Presence of coronary angioplasty implant and graft: Secondary | ICD-10-CM

## 2016-10-20 ENCOUNTER — Other Ambulatory Visit: Payer: Self-pay | Admitting: Cardiovascular Disease

## 2016-10-31 DIAGNOSIS — K219 Gastro-esophageal reflux disease without esophagitis: Secondary | ICD-10-CM | POA: Diagnosis not present

## 2016-10-31 DIAGNOSIS — F419 Anxiety disorder, unspecified: Secondary | ICD-10-CM | POA: Diagnosis not present

## 2016-10-31 DIAGNOSIS — G894 Chronic pain syndrome: Secondary | ICD-10-CM | POA: Diagnosis not present

## 2016-10-31 DIAGNOSIS — I10 Essential (primary) hypertension: Secondary | ICD-10-CM | POA: Diagnosis not present

## 2016-10-31 DIAGNOSIS — Z1389 Encounter for screening for other disorder: Secondary | ICD-10-CM | POA: Diagnosis not present

## 2016-10-31 DIAGNOSIS — Z6824 Body mass index (BMI) 24.0-24.9, adult: Secondary | ICD-10-CM | POA: Diagnosis not present

## 2016-11-22 DIAGNOSIS — Z1389 Encounter for screening for other disorder: Secondary | ICD-10-CM | POA: Diagnosis not present

## 2016-11-22 DIAGNOSIS — M5417 Radiculopathy, lumbosacral region: Secondary | ICD-10-CM | POA: Diagnosis not present

## 2016-11-22 DIAGNOSIS — M5126 Other intervertebral disc displacement, lumbar region: Secondary | ICD-10-CM | POA: Diagnosis not present

## 2016-11-22 DIAGNOSIS — M5136 Other intervertebral disc degeneration, lumbar region: Secondary | ICD-10-CM | POA: Diagnosis not present

## 2016-11-22 DIAGNOSIS — M5127 Other intervertebral disc displacement, lumbosacral region: Secondary | ICD-10-CM | POA: Diagnosis not present

## 2017-01-12 DIAGNOSIS — M47816 Spondylosis without myelopathy or radiculopathy, lumbar region: Secondary | ICD-10-CM | POA: Diagnosis not present

## 2017-01-12 DIAGNOSIS — M5136 Other intervertebral disc degeneration, lumbar region: Secondary | ICD-10-CM | POA: Diagnosis not present

## 2017-01-12 DIAGNOSIS — M5126 Other intervertebral disc displacement, lumbar region: Secondary | ICD-10-CM | POA: Diagnosis not present

## 2017-01-12 DIAGNOSIS — M5416 Radiculopathy, lumbar region: Secondary | ICD-10-CM | POA: Diagnosis not present

## 2017-01-24 DIAGNOSIS — K219 Gastro-esophageal reflux disease without esophagitis: Secondary | ICD-10-CM | POA: Diagnosis not present

## 2017-01-24 DIAGNOSIS — G894 Chronic pain syndrome: Secondary | ICD-10-CM | POA: Diagnosis not present

## 2017-01-24 DIAGNOSIS — Z1389 Encounter for screening for other disorder: Secondary | ICD-10-CM | POA: Diagnosis not present

## 2017-01-24 DIAGNOSIS — N400 Enlarged prostate without lower urinary tract symptoms: Secondary | ICD-10-CM | POA: Diagnosis not present

## 2017-01-24 DIAGNOSIS — Z6825 Body mass index (BMI) 25.0-25.9, adult: Secondary | ICD-10-CM | POA: Diagnosis not present

## 2017-01-24 DIAGNOSIS — I10 Essential (primary) hypertension: Secondary | ICD-10-CM | POA: Diagnosis not present

## 2017-04-13 DIAGNOSIS — Z6825 Body mass index (BMI) 25.0-25.9, adult: Secondary | ICD-10-CM | POA: Diagnosis not present

## 2017-04-13 DIAGNOSIS — G894 Chronic pain syndrome: Secondary | ICD-10-CM | POA: Diagnosis not present

## 2017-04-13 DIAGNOSIS — F419 Anxiety disorder, unspecified: Secondary | ICD-10-CM | POA: Diagnosis not present

## 2017-04-13 DIAGNOSIS — N400 Enlarged prostate without lower urinary tract symptoms: Secondary | ICD-10-CM | POA: Diagnosis not present

## 2017-06-08 ENCOUNTER — Other Ambulatory Visit: Payer: Self-pay | Admitting: Cardiovascular Disease

## 2017-06-08 DIAGNOSIS — E785 Hyperlipidemia, unspecified: Secondary | ICD-10-CM

## 2017-06-08 DIAGNOSIS — Z955 Presence of coronary angioplasty implant and graft: Secondary | ICD-10-CM

## 2017-06-08 DIAGNOSIS — I10 Essential (primary) hypertension: Secondary | ICD-10-CM

## 2017-07-04 DIAGNOSIS — G894 Chronic pain syndrome: Secondary | ICD-10-CM | POA: Diagnosis not present

## 2017-07-04 DIAGNOSIS — K219 Gastro-esophageal reflux disease without esophagitis: Secondary | ICD-10-CM | POA: Diagnosis not present

## 2017-07-04 DIAGNOSIS — Z1389 Encounter for screening for other disorder: Secondary | ICD-10-CM | POA: Diagnosis not present

## 2017-07-04 DIAGNOSIS — F419 Anxiety disorder, unspecified: Secondary | ICD-10-CM | POA: Diagnosis not present

## 2017-07-04 DIAGNOSIS — I10 Essential (primary) hypertension: Secondary | ICD-10-CM | POA: Diagnosis not present

## 2017-07-04 DIAGNOSIS — Z0000 Encounter for general adult medical examination without abnormal findings: Secondary | ICD-10-CM | POA: Diagnosis not present

## 2017-07-04 DIAGNOSIS — Z6824 Body mass index (BMI) 24.0-24.9, adult: Secondary | ICD-10-CM | POA: Diagnosis not present

## 2017-07-06 ENCOUNTER — Other Ambulatory Visit: Payer: Self-pay | Admitting: Cardiovascular Disease

## 2017-07-06 DIAGNOSIS — Z955 Presence of coronary angioplasty implant and graft: Secondary | ICD-10-CM

## 2017-07-06 DIAGNOSIS — E785 Hyperlipidemia, unspecified: Secondary | ICD-10-CM

## 2017-07-06 DIAGNOSIS — I10 Essential (primary) hypertension: Secondary | ICD-10-CM

## 2017-07-18 ENCOUNTER — Other Ambulatory Visit: Payer: Self-pay | Admitting: Cardiovascular Disease

## 2017-07-18 DIAGNOSIS — E785 Hyperlipidemia, unspecified: Secondary | ICD-10-CM

## 2017-07-18 DIAGNOSIS — Z955 Presence of coronary angioplasty implant and graft: Secondary | ICD-10-CM

## 2017-07-18 DIAGNOSIS — I10 Essential (primary) hypertension: Secondary | ICD-10-CM

## 2017-07-22 ENCOUNTER — Other Ambulatory Visit: Payer: Self-pay | Admitting: Cardiovascular Disease

## 2017-07-22 DIAGNOSIS — E785 Hyperlipidemia, unspecified: Secondary | ICD-10-CM

## 2017-07-22 DIAGNOSIS — Z955 Presence of coronary angioplasty implant and graft: Secondary | ICD-10-CM

## 2017-07-22 DIAGNOSIS — I10 Essential (primary) hypertension: Secondary | ICD-10-CM

## 2017-08-20 DIAGNOSIS — F1120 Opioid dependence, uncomplicated: Secondary | ICD-10-CM | POA: Diagnosis not present

## 2017-08-20 DIAGNOSIS — Z6825 Body mass index (BMI) 25.0-25.9, adult: Secondary | ICD-10-CM | POA: Diagnosis not present

## 2017-08-20 DIAGNOSIS — I10 Essential (primary) hypertension: Secondary | ICD-10-CM | POA: Diagnosis not present

## 2017-08-20 DIAGNOSIS — J329 Chronic sinusitis, unspecified: Secondary | ICD-10-CM | POA: Diagnosis not present

## 2017-08-20 DIAGNOSIS — E663 Overweight: Secondary | ICD-10-CM | POA: Diagnosis not present

## 2017-08-20 DIAGNOSIS — Z1389 Encounter for screening for other disorder: Secondary | ICD-10-CM | POA: Diagnosis not present

## 2017-08-20 DIAGNOSIS — K219 Gastro-esophageal reflux disease without esophagitis: Secondary | ICD-10-CM | POA: Diagnosis not present

## 2017-08-24 ENCOUNTER — Other Ambulatory Visit: Payer: Self-pay | Admitting: *Deleted

## 2017-08-24 DIAGNOSIS — Z955 Presence of coronary angioplasty implant and graft: Secondary | ICD-10-CM

## 2017-08-24 DIAGNOSIS — I10 Essential (primary) hypertension: Secondary | ICD-10-CM

## 2017-08-24 DIAGNOSIS — E785 Hyperlipidemia, unspecified: Secondary | ICD-10-CM

## 2017-08-24 MED ORDER — AMLODIPINE BESYLATE 5 MG PO TABS: 5.0000 mg | ORAL_TABLET | Freq: Every day | ORAL | 0 refills | Status: DC

## 2017-08-30 ENCOUNTER — Ambulatory Visit (INDEPENDENT_AMBULATORY_CARE_PROVIDER_SITE_OTHER): Payer: BLUE CROSS/BLUE SHIELD | Admitting: Cardiovascular Disease

## 2017-08-30 ENCOUNTER — Encounter: Payer: Self-pay | Admitting: Cardiovascular Disease

## 2017-08-30 ENCOUNTER — Encounter: Payer: Self-pay | Admitting: *Deleted

## 2017-08-30 VITALS — BP 104/68 | HR 64 | Ht 72.0 in | Wt 164.0 lb

## 2017-08-30 DIAGNOSIS — I25118 Atherosclerotic heart disease of native coronary artery with other forms of angina pectoris: Secondary | ICD-10-CM

## 2017-08-30 DIAGNOSIS — I10 Essential (primary) hypertension: Secondary | ICD-10-CM

## 2017-08-30 DIAGNOSIS — Z955 Presence of coronary angioplasty implant and graft: Secondary | ICD-10-CM | POA: Diagnosis not present

## 2017-08-30 DIAGNOSIS — E785 Hyperlipidemia, unspecified: Secondary | ICD-10-CM

## 2017-08-30 NOTE — Addendum Note (Signed)
Addended by: Laurine Blazer on: 08/30/2017 02:13 PM   Modules accepted: Orders

## 2017-08-30 NOTE — Progress Notes (Signed)
SUBJECTIVE: The patient presents for follow-up of coronary artery disease, hypertension, and hyperlipidemia.  The patient denies any symptoms of chest pain, palpitations, shortness of breath, lightheadedness, dizziness, leg swelling, orthopnea, PND, and syncope.  ECG performed in the office today which I ordered and personally interpreted demonstrates normal sinus rhythm with no ischemic ST segment or T-wave abnormalities, nor any arrhythmias.  He stays active playing basketball and softball and working.  He hopes to ride his motorcycle out to [place] in the near future.  The last time he went was 2003.  He sometimes has acid reflux after eating spicy foods.    Review of Systems: As per "subjective", otherwise negative.  Allergies  Allergen Reactions  . Prednisone Other (See Comments)    Chest pain    Current Outpatient Medications  Medication Sig Dispense Refill  . amLODipine (NORVASC) 5 MG tablet Take 1 tablet (5 mg total) by mouth daily. 30 tablet 0  . aspirin 81 MG tablet Take 1 tablet (81 mg total) by mouth daily.    Marland Kitchen atorvastatin (LIPITOR) 40 MG tablet Take 1 tablet (40 mg total) by mouth every evening. 30 tablet 11  . diazepam (VALIUM) 10 MG tablet Take 1 tablet by mouth 3 (three) times daily as needed.  2  . HYDROcodone-acetaminophen (NORCO) 10-325 MG tablet Take 1 tablet by mouth every 4 (four) hours as needed. 50 tablet 0  . ibuprofen (ADVIL,MOTRIN) 600 MG tablet Take 1 tablet (600 mg total) by mouth every 6 (six) hours as needed. 30 tablet 0  . lisinopril (PRINIVIL,ZESTRIL) 20 MG tablet Take 20 mg by mouth 2 (two) times daily.    . metoprolol tartrate (LOPRESSOR) 25 MG tablet TAKE 1/2 TABLET BY MOUTH TWICE DAILY 30 tablet 3  . nitroGLYCERIN (NITROSTAT) 0.4 MG SL tablet Place 1 tablet (0.4 mg total) under the tongue every 5 (five) minutes as needed for chest pain (CP or SOB). 25 tablet 3  . oxyCODONE-acetaminophen (PERCOCET/ROXICET) 5-325 MG tablet Take 1 tablet by  mouth every 4 (four) hours as needed. 20 tablet 0   No current facility-administered medications for this visit.     Past Medical History:  Diagnosis Date  . Anxiety   . Arthritis   . Bradycardia    a. During 11/2012 adm: lopressor decreased.  . 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 Coronary artery disease    a. Diag cath 10/2012 for CP/abnormal nuc -> planned PCI s/p LAD atherectomy/DES placement 11/05/12.  Marland Kitchen GERD (gastroesophageal reflux disease)   . Hypercholesteremia   . Hypertension     Past Surgical History:  Procedure Laterality Date  . CARDIAC CATHETERIZATION  10/29/2012  . CORONARY ANGIOPLASTY WITH STENT PLACEMENT  11/05/2012  . HEMORRHOID SURGERY  ~ 2010  . INSERTION OF MESH N/A 06/02/2015   Procedure: INSERTION OF MESH;  Surgeon: [name], MD;  Location: [facility];  Service: General;  Laterality: N/A;  . LEFT HEART CATHETERIZATION WITH CORONARY ANGIOGRAM N/A 10/30/2012   Procedure: LEFT HEART CATHETERIZATION WITH CORONARY ANGIOGRAM;  Surgeon: [name], MD;  Location: [facility] CATH [facility];  Service: Cardiovascular;  Laterality: N/A;  . PERCUTANEOUS CORONARY ROTOBLATOR INTERVENTION (PCI-R) N/A 11/05/2012   Procedure: PERCUTANEOUS CORONARY ROTOBLATOR INTERVENTION (PCI-R);  Surgeon: [name], MD;  Location: [facility] C[facility] LAB;  Service: Cardiovascular;  Laterality: N/A;  . UMBILICAL HERNIA REPAIR N/A 06/02/2015   Procedure: UMBILICAL  HERNIORRHAPHY WITH MESH;  Surgeon: [name], MD;  Location: [facility];  Service: General;  Laterality: N/A;    Social History   Socioeconomic History  . Marital status: Married    Spouse name: Not on file  . Number of children: Not on file  . Years of education: Not on file  . Highest education level: Not on file  Occupational History  . Not on file  Social Needs  . Financial resource strain: Not on file  . Food insecurity:    Worry: Not on file     Inability: Not on file  . Transportation needs:    Medical: Not on file    Non-medical: Not on file  Tobacco Use  . Smoking status: Former Smoker    Packs/day: 3.00    Years: 32.00    Pack years: 96.00    Types: Cigarettes    Start date: 08/29/1977    Last attempt to quit: 11/29/2006    Years since quitting: 10.7  . Smokeless tobacco: Former Systems developer    Types: Chew  . Tobacco comment: 11/05/2012 "chewed tobacco when I play ball; aien't chewed since age 15"  Substance and Sexual Activity  . Alcohol use: Yes    Alcohol/week: 0.0 standard drinks    Comment: few beers every day  . Drug use: No  . Sexual activity: Not Currently  Lifestyle  . Physical activity:    Days per week: Not on file    Minutes per session: Not on file  . Stress: Not on file  Relationships  . Social connections:    Talks on phone: Not on file    Gets together: Not on file    Attends religious service: Not on file    Active member of club or organization: Not on file    Attends meetings of clubs or organizations: Not on file    Relationship status: Not on file  . Intimate partner violence:    Fear of current or ex partner: Not on file    Emotionally abused: Not on file    Physically abused: Not on file    Forced sexual activity: Not on file  Other Topics Concern  . Not on file  Social History Narrative  . Not on file     Vitals:   08/30/17 1340  BP: 104/68  Pulse: 64  SpO2: 95%  Weight: 164 lb (74.4 kg)  Height: 6' (1.829 m)    Wt Readings from Last 3 Encounters:  08/30/17 164 lb (74.4 kg)  08/01/16 181 lb (82.1 kg)  07/20/16 185 lb (83.9 kg)     PHYSICAL EXAM General: NAD HEENT: Normal. Neck: No JVD, no thyromegaly. Lungs: Clear to auscultation bilaterally with normal respiratory effort. CV: Regular rate and rhythm, normal S1/S2, no S3/S4, no murmur. No pretibial or periankle edema.  No carotid bruit.   Abdomen: Soft, nontender, no distention.  Neurologic: Alert and oriented.  Psych:  Normal affect. Skin: Normal. Musculoskeletal: No gross deformities.    ECG: Reviewed above under Subjective   Labs: Lab Results  Component Value Date/Time   K 4.9 07/20/2016 11:35 AM   BUN 16 07/20/2016 11:35 AM   CREATININE 1.10 07/20/2016 11:35 AM   CREATININE 1.23 10/28/2012 03:58 PM   ALT 16 08/18/2013 09:58 AM   HGB 13.3 07/20/2016 11:35 AM     Lipids: Lab Results  Component Value Date/Time   LDLCALC 65 08/18/2013 09:58 AM   CHOL 156 08/18/2013 09:58 AM   TRIG 114 08/18/2013  09:58 AM   HDL 68 08/18/2013 09:58 AM       ASSESSMENT AND PLAN:  1. CAD s/p LAD atherectomy and DES placement on 11/05/12: Symptomatically stable.  Continue aspirin, atorvastatin, and metoprolol.  2. Essential hypertension: Controlled on present therapy which includes amlodipine, metoprolol, and lisinopril.  No changes to therapy.  3. Hyperlipidemia: Continue atorvastatin 40 mg.  I will obtain a copy of lipids from PCP.   Disposition: Follow up 1 year.   Kate Sable, M.D., F.A.C.C.

## 2017-08-30 NOTE — Patient Instructions (Signed)

## 2017-09-27 DIAGNOSIS — Z23 Encounter for immunization: Secondary | ICD-10-CM | POA: Diagnosis not present

## 2017-09-27 DIAGNOSIS — Z6824 Body mass index (BMI) 24.0-24.9, adult: Secondary | ICD-10-CM | POA: Diagnosis not present

## 2017-09-27 DIAGNOSIS — M5417 Radiculopathy, lumbosacral region: Secondary | ICD-10-CM | POA: Diagnosis not present

## 2017-09-27 DIAGNOSIS — Z1389 Encounter for screening for other disorder: Secondary | ICD-10-CM | POA: Diagnosis not present

## 2017-09-27 DIAGNOSIS — F1120 Opioid dependence, uncomplicated: Secondary | ICD-10-CM | POA: Diagnosis not present

## 2017-09-27 DIAGNOSIS — I10 Essential (primary) hypertension: Secondary | ICD-10-CM | POA: Diagnosis not present

## 2017-10-16 ENCOUNTER — Telehealth: Payer: Self-pay | Admitting: *Deleted

## 2017-10-16 DIAGNOSIS — I10 Essential (primary) hypertension: Secondary | ICD-10-CM

## 2017-10-16 DIAGNOSIS — Z955 Presence of coronary angioplasty implant and graft: Secondary | ICD-10-CM

## 2017-10-16 DIAGNOSIS — E785 Hyperlipidemia, unspecified: Secondary | ICD-10-CM

## 2017-10-16 NOTE — Telephone Encounter (Signed)
Lipid panel received from pmd (see epic) - per Dr. Shawna Orleans review - increase Lipitor to 80mg  daily.  Attempted to notify patient - lmtcb.

## 2017-10-23 MED ORDER — ATORVASTATIN CALCIUM 80 MG PO TABS: 80.0000 mg | ORAL_TABLET | Freq: Every evening | ORAL | 3 refills | Status: DC

## 2017-10-23 NOTE — Telephone Encounter (Signed)
Patient notified and verbalized understanding.  New prescription sent to [facility] now.

## 2017-11-09 ENCOUNTER — Encounter (INDEPENDENT_AMBULATORY_CARE_PROVIDER_SITE_OTHER): Payer: Self-pay | Admitting: *Deleted

## 2017-12-19 DIAGNOSIS — K219 Gastro-esophageal reflux disease without esophagitis: Secondary | ICD-10-CM | POA: Diagnosis not present

## 2017-12-19 DIAGNOSIS — N400 Enlarged prostate without lower urinary tract symptoms: Secondary | ICD-10-CM | POA: Diagnosis not present

## 2017-12-19 DIAGNOSIS — F419 Anxiety disorder, unspecified: Secondary | ICD-10-CM | POA: Diagnosis not present

## 2017-12-19 DIAGNOSIS — S46001A Unspecified injury of muscle(s) and tendon(s) of the rotator cuff of right shoulder, initial encounter: Secondary | ICD-10-CM | POA: Diagnosis not present

## 2017-12-19 DIAGNOSIS — G894 Chronic pain syndrome: Secondary | ICD-10-CM | POA: Diagnosis not present

## 2017-12-19 DIAGNOSIS — Z1389 Encounter for screening for other disorder: Secondary | ICD-10-CM | POA: Diagnosis not present

## 2017-12-19 DIAGNOSIS — Z6824 Body mass index (BMI) 24.0-24.9, adult: Secondary | ICD-10-CM | POA: Diagnosis not present

## 2017-12-19 DIAGNOSIS — F1120 Opioid dependence, uncomplicated: Secondary | ICD-10-CM | POA: Diagnosis not present

## 2017-12-21 ENCOUNTER — Emergency Department (HOSPITAL_COMMUNITY): Payer: BLUE CROSS/BLUE SHIELD

## 2017-12-21 ENCOUNTER — Emergency Department (HOSPITAL_COMMUNITY)
Admission: EM | Admit: 2017-12-21 | Discharge: 2017-12-21 | Disposition: A | Discharge status: Home / Self Care | Payer: BLUE CROSS/BLUE SHIELD | Attending: Emergency Medicine | Admitting: Emergency Medicine

## 2017-12-21 ENCOUNTER — Encounter (HOSPITAL_COMMUNITY): Payer: Self-pay | Admitting: Emergency Medicine

## 2017-12-21 ENCOUNTER — Other Ambulatory Visit: Payer: Self-pay

## 2017-12-21 DIAGNOSIS — I10 Essential (primary) hypertension: Secondary | ICD-10-CM | POA: Insufficient documentation

## 2017-12-21 DIAGNOSIS — Z87891 Personal history of nicotine dependence: Secondary | ICD-10-CM | POA: Insufficient documentation

## 2017-12-21 DIAGNOSIS — Z79899 Other long term (current) drug therapy: Secondary | ICD-10-CM | POA: Insufficient documentation

## 2017-12-21 DIAGNOSIS — M25511 Pain in right shoulder: Secondary | ICD-10-CM | POA: Insufficient documentation

## 2017-12-21 DIAGNOSIS — Z7982 Long term (current) use of aspirin: Secondary | ICD-10-CM | POA: Diagnosis not present

## 2017-12-21 DIAGNOSIS — I2510 Atherosclerotic heart disease of native coronary artery without angina pectoris: Secondary | ICD-10-CM | POA: Diagnosis not present

## 2017-12-21 MED ORDER — LIDOCAINE 5 % EX PTCH: 1.0000 | MEDICATED_PATCH | CUTANEOUS | 0 refills | Status: DC

## 2017-12-21 NOTE — Discharge Instructions (Signed)
Avoid any activity that worsens your pain, especially overhead reaching, etc.  Given the weakness with this movement, I am concerned you may have a ligament tear in your shoulder.  Wear the sling for comfort.  Call Dr. Aline Brochure for further evaluation as discussed.  You have no new fractures and your shoulder is in its joint per todays xrays.

## 2017-12-21 NOTE — ED Notes (Signed)
ED Provider at bedside. 

## 2017-12-21 NOTE — ED Triage Notes (Signed)
Patient complaining of right shoulder pain after pulling on something to prevent himself from falling yesterday.

## 2017-12-21 NOTE — ED Notes (Signed)
Patient transported to X-ray 

## 2017-12-22 NOTE — ED Provider Notes (Signed)
[facility] EMERGENCY DEPARTMENT Provider Note   CSN: [id number] Arrival date & time: 12/21/17  1041     History   Chief Complaint Chief Complaint  Patient presents with  . Shoulder Injury    HPI [name] is a 57 y.o. male with a history of chronic low back pain who is under the care of a pain management specialist, CAD, GERD and hypertension and a chronic right shoulder injury, presenting with increased pain in the right shoulder since yesterday.  He describes tripping yesterday and catching a handrail to prevent fall and hyperextended the right shoulder.  He reports that his shoulder was dislocated which he has learned to reduce as this is happened in the past.  He is also seen Dr. Aline [name] in the past for this right shoulder issue and underwent physical therapy years ago which was helpful.  He denies weakness or numbness in his right arm or hand.  He does have increased pain and weakness with attempts to raise the right arm over shoulder height level.  He is taking hydrocodone 10/325 4 times daily which is offered some relief of pain.  The history is provided by the patient.    Past Medical History:  Diagnosis Date  . Anxiety   . Arthritis   . Bradycardia    a. During 11/2012 adm: lopressor decreased.  . 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 Coronary artery disease    a. Diag cath 10/2012 for CP/abnormal nuc -> planned PCI s/p LAD atherectomy/DES placement 11/05/12.  Marland Kitchen GERD (gastroesophageal reflux disease)   . Hypercholesteremia   . Hypertension     Patient Active Problem List   Diagnosis Date Noted  . Rotator cuff syndrome of right shoulder 02/20/2013  . Bradycardia 11/06/2012  . Coronary artery disease   . Abnormal finding on cardiovascular stress test 09/25/2012  . Hypertension 09/03/2012  . Chest pain 09/03/2012  . Alcohol abuse, daily use 09/03/2012  . Hyperlipidemia 09/03/2012   . Hyponatremia 09/03/2012    Past Surgical History:  Procedure Laterality Date  . CARDIAC CATHETERIZATION  10/29/2012  . CORONARY ANGIOPLASTY WITH STENT PLACEMENT  11/05/2012  . HEMORRHOID SURGERY  ~ 2010  . INSERTION OF MESH N/A 06/02/2015   Procedure: INSERTION OF MESH;  Surgeon: [name], MD;  Location: [facility];  Service: General;  Laterality: N/A;  . LEFT HEART CATHETERIZATION WITH CORONARY ANGIOGRAM N/A 10/30/2012   Procedure: LEFT HEART CATHETERIZATION WITH CORONARY ANGIOGRAM;  Surgeon: [name], MD;  Location: [facility] CATH LAB;  Service: Cardiovascular;  Laterality: N/A;  . PERCUTANEOUS CORONARY ROTOBLATOR INTERVENTION (PCI-R) N/A 11/05/2012   Procedure: PERCUTANEOUS CORONARY ROTOBLATOR INTERVENTION (PCI-R);  Surgeon: [name], MD;  Location: [facility] CATH LAB;  Service: Cardiovascular;  Laterality: N/A;  . UMBILICAL HERNIA REPAIR N/A 06/02/2015   Procedure: UMBILICAL HERNIORRHAPHY WITH MESH;  Surgeon: [name], MD;  Location: [facility];  Service: General;  Laterality: N/A;        Home Medications    Prior to Admission medications   Medication Sig Start Date End Date Taking? Authorizing Provider  amLODipine (NORVASC) 5 MG tablet Take 1 tablet (5 mg total) by mouth daily. 08/24/17   Herminio Commons, MD  aspirin 81 MG tablet Take 1 tablet (81 mg total) by mouth daily. 11/06/12   Dunn, Nedra Hai, PA-C  atorvastatin (LIPITOR) 80 MG tablet Take 1 tablet (80 mg  total) by mouth every evening. 10/23/17   Herminio Commons, MD  diazepam (VALIUM) 10 MG tablet Take 1 tablet by mouth 3 (three) times daily as needed. 05/16/15   [provider]  HYDROcodone-acetaminophen (NORCO) 10-325 MG tablet Take 1 tablet by mouth every 4 (four) hours as needed. 06/02/15   [name], MD  ibuprofen (ADVIL,MOTRIN) 600 MG tablet Take 1 tablet (600 mg total) by mouth every 6 (six) hours as needed. 07/20/16   Evalee Jefferson, PA-C  lidocaine (LIDODERM) 5 % Place 1 patch onto the skin daily.  Remove & Discard patch within 12 hours or as directed by MD 12/21/17   Evalee Jefferson, PA-C  lisinopril (PRINIVIL,ZESTRIL) 20 MG tablet Take 20 mg by mouth 2 (two) times daily.    [provider]  metoprolol tartrate (LOPRESSOR) 25 MG tablet TAKE 1/2 TABLET BY MOUTH TWICE DAILY 01/25/15   Herminio Commons, MD  nitroGLYCERIN (NITROSTAT) 0.4 MG SL tablet Place 1 tablet (0.4 mg total) under the tongue every 5 (five) minutes as needed for chest pain (CP or SOB). 07/27/14   Herminio Commons, MD    Family History Family History  Problem Relation Age of Onset  . Heart disease Father   . Heart disease Sister     Social History Social History   Tobacco Use  . Smoking status: Former Smoker    Packs/day: 3.00    Years: 32.00    Pack years: 96.00    Types: Cigarettes    Start date: 08/29/1977    Last attempt to quit: 11/29/2006    Years since quitting: 11.0  . Smokeless tobacco: Former Systems developer    Types: Chew  . Tobacco comment: 11/05/2012 "chewed tobacco when I play ball; aien't chewed since age 52"  Substance Use Topics  . Alcohol use: Yes    Alcohol/week: 0.0 standard drinks    Comment: "couple of beers daily"  . Drug use: No     Allergies   Prednisone   Review of Systems Review of Systems  Constitutional: Negative for fever.  Musculoskeletal: Positive for arthralgias. Negative for joint swelling and myalgias.  Neurological: Negative for weakness and numbness.  All other systems reviewed and are negative.    Physical Exam Updated Vital Signs BP (!) 160/79   Pulse 66   Temp 97.6 F (36.4 C) (Temporal)   Resp 14   Ht 5\' 9"  (1.753 m)   Wt 80.7 kg   SpO2 99%   BMI 26.29 kg/m   Physical Exam Constitutional:      Appearance: He is well-developed.  HENT:     Head: Atraumatic.  Neck:     Musculoskeletal: Normal range of motion.  Cardiovascular:     Comments: Pulses equal bilaterally Musculoskeletal:        General: Tenderness present. No swelling or  deformity.     Right shoulder: He exhibits bony tenderness. He exhibits no swelling, no effusion, no crepitus, normal pulse and normal strength.     Comments: Equal grip strength.  Patient can abduct at the right shoulder 90 degrees, he is unable to anterior flex the right shoulder to 90 degrees without weakness and collapse.  There is no palpable deformities.  No appreciable effusion.  No crepitus with range of motion.  Skin:    General: Skin is warm and dry.  Neurological:     Mental Status: He is alert.     Sensory: No sensory deficit.     Deep Tendon Reflexes: Reflexes normal.  ED Treatments / Results  Labs (all labs ordered are listed, but only abnormal results are displayed) Labs Reviewed - No data to display  EKG None  Radiology Dg Shoulder Right  Result Date: 12/21/2017 CLINICAL DATA:  Slip and fall with right shoulder pain, initial encounter EXAM: RIGHT SHOULDER - 2+ VIEW COMPARISON:  02/20/2013 FINDINGS: No acute fracture or dislocation is noted. Degenerative changes about the acromioclavicular joint are noted. Some remodeling of the acromion is seen stable from the prior exam with a slightly high-riding humeral head. Possibility of underlying rotator cuff injury deserves consideration. No other focal abnormality is noted. IMPRESSION: Chronic appearing changes about the right shoulder as described above. No acute abnormality noted. Electronically Signed   By: Inez Catalina M.D.   On: 12/21/2017 11:27    Procedures Procedures (including critical care time)  Medications Ordered in ED Medications - No data to display   Initial Impression / Assessment and Plan / ED Course  I have reviewed the triage vital signs and the nursing notes.  Pertinent labs & imaging results that were available during my care of the patient were reviewed by me and considered in my medical decision making (see chart for details).     Imaging reviewed and discussed with patient.  I suspect he  has a rotator cuff tear given this new weakness with yesterday's injury.  He was placed in a sling.  We discussed home treatment including ice, heat.  He is anticipating follow-up with Dr. Aline [name] and will call for this appointment.  Advised to continue his hydrocodone as he is scheduled, added Lidoderm patch for additional pain relief.  Final Clinical Impressions(s) / ED Diagnoses   Final diagnoses:  Acute pain of right shoulder    ED Discharge Orders         Ordered    lidocaine (LIDODERM) 5 %  Every 24 hours     12/21/17 1257           Evalee Jefferson, PA-C 12/22/17 0818    Nat Christen, MD 12/22/17 [phone]

## 2018-01-29 DIAGNOSIS — Z6824 Body mass index (BMI) 24.0-24.9, adult: Secondary | ICD-10-CM | POA: Diagnosis not present

## 2018-01-29 DIAGNOSIS — F419 Anxiety disorder, unspecified: Secondary | ICD-10-CM | POA: Diagnosis not present

## 2018-01-29 DIAGNOSIS — Z1389 Encounter for screening for other disorder: Secondary | ICD-10-CM | POA: Diagnosis not present

## 2018-01-29 DIAGNOSIS — G894 Chronic pain syndrome: Secondary | ICD-10-CM | POA: Diagnosis not present

## 2018-01-29 DIAGNOSIS — F1120 Opioid dependence, uncomplicated: Secondary | ICD-10-CM | POA: Diagnosis not present

## 2018-01-29 DIAGNOSIS — I10 Essential (primary) hypertension: Secondary | ICD-10-CM | POA: Diagnosis not present

## 2018-02-26 DIAGNOSIS — K219 Gastro-esophageal reflux disease without esophagitis: Secondary | ICD-10-CM | POA: Diagnosis not present

## 2018-02-26 DIAGNOSIS — Z6823 Body mass index (BMI) 23.0-23.9, adult: Secondary | ICD-10-CM | POA: Diagnosis not present

## 2018-02-26 DIAGNOSIS — J329 Chronic sinusitis, unspecified: Secondary | ICD-10-CM | POA: Diagnosis not present

## 2018-02-26 DIAGNOSIS — Z1389 Encounter for screening for other disorder: Secondary | ICD-10-CM | POA: Diagnosis not present

## 2018-02-26 DIAGNOSIS — I10 Essential (primary) hypertension: Secondary | ICD-10-CM | POA: Diagnosis not present

## 2018-02-26 DIAGNOSIS — G894 Chronic pain syndrome: Secondary | ICD-10-CM | POA: Diagnosis not present

## 2018-03-19 DIAGNOSIS — M1388 Other specified arthritis, other site: Secondary | ICD-10-CM | POA: Diagnosis not present

## 2018-03-19 DIAGNOSIS — Z1389 Encounter for screening for other disorder: Secondary | ICD-10-CM | POA: Diagnosis not present

## 2018-03-19 DIAGNOSIS — G894 Chronic pain syndrome: Secondary | ICD-10-CM | POA: Diagnosis not present

## 2018-03-19 DIAGNOSIS — Z6823 Body mass index (BMI) 23.0-23.9, adult: Secondary | ICD-10-CM | POA: Diagnosis not present

## 2018-03-19 DIAGNOSIS — I10 Essential (primary) hypertension: Secondary | ICD-10-CM | POA: Diagnosis not present

## 2018-03-19 DIAGNOSIS — K219 Gastro-esophageal reflux disease without esophagitis: Secondary | ICD-10-CM | POA: Diagnosis not present

## 2018-03-19 DIAGNOSIS — N400 Enlarged prostate without lower urinary tract symptoms: Secondary | ICD-10-CM | POA: Diagnosis not present

## 2018-05-02 DIAGNOSIS — Z1389 Encounter for screening for other disorder: Secondary | ICD-10-CM | POA: Diagnosis not present

## 2018-05-02 DIAGNOSIS — Z6823 Body mass index (BMI) 23.0-23.9, adult: Secondary | ICD-10-CM | POA: Diagnosis not present

## 2018-05-02 DIAGNOSIS — G894 Chronic pain syndrome: Secondary | ICD-10-CM | POA: Diagnosis not present

## 2018-05-02 DIAGNOSIS — M545 Low back pain: Secondary | ICD-10-CM | POA: Diagnosis not present

## 2018-05-30 DIAGNOSIS — Z6822 Body mass index (BMI) 22.0-22.9, adult: Secondary | ICD-10-CM | POA: Diagnosis not present

## 2018-05-30 DIAGNOSIS — J029 Acute pharyngitis, unspecified: Secondary | ICD-10-CM | POA: Diagnosis not present

## 2018-05-30 DIAGNOSIS — G894 Chronic pain syndrome: Secondary | ICD-10-CM | POA: Diagnosis not present

## 2018-05-30 DIAGNOSIS — M545 Low back pain: Secondary | ICD-10-CM | POA: Diagnosis not present

## 2018-06-28 DIAGNOSIS — I10 Essential (primary) hypertension: Secondary | ICD-10-CM | POA: Diagnosis not present

## 2018-06-28 DIAGNOSIS — N400 Enlarged prostate without lower urinary tract symptoms: Secondary | ICD-10-CM | POA: Diagnosis not present

## 2018-06-28 DIAGNOSIS — Z6823 Body mass index (BMI) 23.0-23.9, adult: Secondary | ICD-10-CM | POA: Diagnosis not present

## 2018-06-28 DIAGNOSIS — K219 Gastro-esophageal reflux disease without esophagitis: Secondary | ICD-10-CM | POA: Diagnosis not present

## 2018-06-28 DIAGNOSIS — G894 Chronic pain syndrome: Secondary | ICD-10-CM | POA: Diagnosis not present

## 2018-06-28 DIAGNOSIS — Z1389 Encounter for screening for other disorder: Secondary | ICD-10-CM | POA: Diagnosis not present

## 2018-07-22 ENCOUNTER — Ambulatory Visit (INDEPENDENT_AMBULATORY_CARE_PROVIDER_SITE_OTHER): Payer: BC Managed Care – PPO | Admitting: Otolaryngology

## 2018-07-22 ENCOUNTER — Other Ambulatory Visit: Payer: Self-pay

## 2018-07-22 DIAGNOSIS — H6123 Impacted cerumen, bilateral: Secondary | ICD-10-CM | POA: Diagnosis not present

## 2018-07-22 DIAGNOSIS — R1312 Dysphagia, oropharyngeal phase: Secondary | ICD-10-CM | POA: Diagnosis not present

## 2018-07-22 DIAGNOSIS — K219 Gastro-esophageal reflux disease without esophagitis: Secondary | ICD-10-CM

## 2018-07-26 DIAGNOSIS — Z6823 Body mass index (BMI) 23.0-23.9, adult: Secondary | ICD-10-CM | POA: Diagnosis not present

## 2018-07-26 DIAGNOSIS — Z1389 Encounter for screening for other disorder: Secondary | ICD-10-CM | POA: Diagnosis not present

## 2018-07-26 DIAGNOSIS — I10 Essential (primary) hypertension: Secondary | ICD-10-CM | POA: Diagnosis not present

## 2018-07-26 DIAGNOSIS — M5417 Radiculopathy, lumbosacral region: Secondary | ICD-10-CM | POA: Diagnosis not present

## 2018-07-26 DIAGNOSIS — G894 Chronic pain syndrome: Secondary | ICD-10-CM | POA: Diagnosis not present

## 2018-07-26 DIAGNOSIS — K219 Gastro-esophageal reflux disease without esophagitis: Secondary | ICD-10-CM | POA: Diagnosis not present

## 2018-07-26 DIAGNOSIS — F1120 Opioid dependence, uncomplicated: Secondary | ICD-10-CM | POA: Diagnosis not present

## 2018-07-31 ENCOUNTER — Emergency Department (HOSPITAL_COMMUNITY): Payer: BC Managed Care – PPO

## 2018-07-31 ENCOUNTER — Inpatient Hospital Stay (HOSPITAL_COMMUNITY)
Admission: EM | Admit: 2018-07-31 | Discharge: 2018-08-03 | DRG: 065 | Disposition: A | Discharge status: Home / Self Care | Payer: BC Managed Care – PPO | Attending: Internal Medicine | Admitting: Internal Medicine

## 2018-07-31 ENCOUNTER — Encounter (HOSPITAL_COMMUNITY): Payer: Self-pay

## 2018-07-31 ENCOUNTER — Other Ambulatory Visit: Payer: Self-pay

## 2018-07-31 DIAGNOSIS — K219 Gastro-esophageal reflux disease without esophagitis: Secondary | ICD-10-CM | POA: Diagnosis present

## 2018-07-31 DIAGNOSIS — Z955 Presence of coronary angioplasty implant and graft: Secondary | ICD-10-CM | POA: Diagnosis not present

## 2018-07-31 DIAGNOSIS — T679XXA Effect of heat and light, unspecified, initial encounter: Secondary | ICD-10-CM | POA: Diagnosis not present

## 2018-07-31 DIAGNOSIS — R4020 Unspecified coma: Secondary | ICD-10-CM | POA: Diagnosis not present

## 2018-07-31 DIAGNOSIS — R51 Headache: Secondary | ICD-10-CM | POA: Diagnosis not present

## 2018-07-31 DIAGNOSIS — I639 Cerebral infarction, unspecified: Secondary | ICD-10-CM

## 2018-07-31 DIAGNOSIS — I2510 Atherosclerotic heart disease of native coronary artery without angina pectoris: Secondary | ICD-10-CM | POA: Diagnosis not present

## 2018-07-31 DIAGNOSIS — E785 Hyperlipidemia, unspecified: Secondary | ICD-10-CM | POA: Diagnosis not present

## 2018-07-31 DIAGNOSIS — E7800 Pure hypercholesterolemia, unspecified: Secondary | ICD-10-CM | POA: Diagnosis present

## 2018-07-31 DIAGNOSIS — Z209 Contact with and (suspected) exposure to unspecified communicable disease: Secondary | ICD-10-CM | POA: Diagnosis not present

## 2018-07-31 DIAGNOSIS — I10 Essential (primary) hypertension: Secondary | ICD-10-CM | POA: Diagnosis not present

## 2018-07-31 DIAGNOSIS — Z823 Family history of stroke: Secondary | ICD-10-CM

## 2018-07-31 DIAGNOSIS — F1010 Alcohol abuse, uncomplicated: Secondary | ICD-10-CM | POA: Diagnosis present

## 2018-07-31 DIAGNOSIS — R531 Weakness: Secondary | ICD-10-CM | POA: Diagnosis not present

## 2018-07-31 DIAGNOSIS — I161 Hypertensive emergency: Secondary | ICD-10-CM | POA: Diagnosis present

## 2018-07-31 DIAGNOSIS — Z79899 Other long term (current) drug therapy: Secondary | ICD-10-CM

## 2018-07-31 DIAGNOSIS — R52 Pain, unspecified: Secondary | ICD-10-CM | POA: Diagnosis not present

## 2018-07-31 DIAGNOSIS — I6503 Occlusion and stenosis of bilateral vertebral arteries: Secondary | ICD-10-CM | POA: Diagnosis not present

## 2018-07-31 DIAGNOSIS — Z7982 Long term (current) use of aspirin: Secondary | ICD-10-CM

## 2018-07-31 DIAGNOSIS — I679 Cerebrovascular disease, unspecified: Secondary | ICD-10-CM | POA: Diagnosis not present

## 2018-07-31 DIAGNOSIS — R069 Unspecified abnormalities of breathing: Secondary | ICD-10-CM | POA: Diagnosis not present

## 2018-07-31 DIAGNOSIS — Z87891 Personal history of nicotine dependence: Secondary | ICD-10-CM | POA: Diagnosis not present

## 2018-07-31 DIAGNOSIS — Z20828 Contact with and (suspected) exposure to other viral communicable diseases: Secondary | ICD-10-CM | POA: Diagnosis present

## 2018-07-31 DIAGNOSIS — R4182 Altered mental status, unspecified: Secondary | ICD-10-CM | POA: Diagnosis present

## 2018-07-31 DIAGNOSIS — R0602 Shortness of breath: Secondary | ICD-10-CM | POA: Diagnosis not present

## 2018-07-31 DIAGNOSIS — I63213 Cerebral infarction due to unspecified occlusion or stenosis of bilateral vertebral arteries: Principal | ICD-10-CM | POA: Diagnosis present

## 2018-07-31 DIAGNOSIS — I6322 Cerebral infarction due to unspecified occlusion or stenosis of basilar arteries: Secondary | ICD-10-CM | POA: Diagnosis not present

## 2018-07-31 DIAGNOSIS — R42 Dizziness and giddiness: Secondary | ICD-10-CM | POA: Diagnosis not present

## 2018-07-31 DIAGNOSIS — R29701 NIHSS score 1: Secondary | ICD-10-CM | POA: Diagnosis not present

## 2018-07-31 DIAGNOSIS — G8190 Hemiplegia, unspecified affecting unspecified side: Secondary | ICD-10-CM | POA: Diagnosis not present

## 2018-07-31 DIAGNOSIS — E871 Hypo-osmolality and hyponatremia: Secondary | ICD-10-CM | POA: Diagnosis not present

## 2018-07-31 DIAGNOSIS — I6523 Occlusion and stenosis of bilateral carotid arteries: Secondary | ICD-10-CM | POA: Diagnosis not present

## 2018-07-31 HISTORY — DX: Cerebral infarction, unspecified: I63.9

## 2018-07-31 LAB — COMPREHENSIVE METABOLIC PANEL
ALT: 12 U/L (ref 0–44)
AST: 19 U/L (ref 15–41)
Albumin: 4.8 g/dL (ref 3.5–5.0)
Alkaline Phosphatase: 44 U/L (ref 38–126)
Anion gap: 11 (ref 5–15)
BUN: 13 mg/dL (ref 6–20)
CO2: 23 mmol/L (ref 22–32)
Calcium: 9.7 mg/dL (ref 8.9–10.3)
Chloride: 95 mmol/L — ABNORMAL LOW (ref 98–111)
Creatinine, Ser: 0.83 mg/dL (ref 0.61–1.24)
GFR calc Af Amer: >60 mL/min (ref >60)
GFR calc non Af Amer: >60 mL/min (ref >60)
Glucose, Bld: 126 mg/dL — ABNORMAL HIGH (ref 70–99)
Potassium: 3.8 mmol/L (ref 3.5–5.1)
Sodium: 129 mmol/L — ABNORMAL LOW (ref 135–145)
Total Bilirubin: 1 mg/dL (ref 0.3–1.2)
Total Protein: 8.4 g/dL — ABNORMAL HIGH (ref 6.5–8.1)

## 2018-07-31 LAB — CBC
HCT: 43 % (ref 39.0–52.0)
Hemoglobin: 15.1 g/dL (ref 13.0–17.0)
MCH: 32.8 pg (ref 26.0–34.0)
MCHC: 35.1 g/dL (ref 30.0–36.0)
MCV: 93.3 fL (ref 80.0–100.0)
Platelets: 261 10*3/uL (ref 150–400)
RBC: 4.61 MIL/uL (ref 4.22–5.81)
RDW: 12.1 % (ref 11.5–15.5)
WBC: 9.1 10*3/uL (ref 4.0–10.5)
nRBC: 0 % (ref 0.0–0.2)

## 2018-07-31 LAB — CBC WITH DIFFERENTIAL/PLATELET
Abs Immature Granulocytes: 0.02 10*3/uL (ref 0.00–0.07)
Basophils Absolute: 0 10*3/uL (ref 0.0–0.1)
Basophils Relative: 0 %
Eosinophils Absolute: 0 10*3/uL (ref 0.0–0.5)
Eosinophils Relative: 0 %
HCT: 44.3 % (ref 39.0–52.0)
Hemoglobin: 15 g/dL (ref 13.0–17.0)
Immature Granulocytes: 0 %
Lymphocytes Relative: 12 %
Lymphs Abs: 1 10*3/uL (ref 0.7–4.0)
MCH: 32.4 pg (ref 26.0–34.0)
MCHC: 33.9 g/dL (ref 30.0–36.0)
MCV: 95.7 fL (ref 80.0–100.0)
Monocytes Absolute: 0.8 10*3/uL (ref 0.1–1.0)
Monocytes Relative: 10 %
Neutro Abs: 6.5 10*3/uL (ref 1.7–7.7)
Neutrophils Relative %: 78 %
Platelets: 262 10*3/uL (ref 150–400)
RBC: 4.63 MIL/uL (ref 4.22–5.81)
RDW: 12.1 % (ref 11.5–15.5)
WBC: 8.4 10*3/uL (ref 4.0–10.5)
nRBC: 0 % (ref 0.0–0.2)

## 2018-07-31 LAB — URINALYSIS, ROUTINE W REFLEX MICROSCOPIC
Bacteria, UA: NONE SEEN
Bilirubin Urine: NEGATIVE
Glucose, UA: NEGATIVE mg/dL
Hgb urine dipstick: NEGATIVE
Ketones, ur: NEGATIVE mg/dL
Leukocytes,Ua: NEGATIVE
Nitrite: NEGATIVE
Protein, ur: 30 mg/dL — AB
Specific Gravity, Urine: 1.012 (ref 1.005–1.030)
pH: 8 (ref 5.0–8.0)

## 2018-07-31 LAB — TROPONIN I (HIGH SENSITIVITY)
Troponin I (High Sensitivity): 5 ng/L (ref <18)
Troponin I (High Sensitivity): 4 ng/L (ref <18)

## 2018-07-31 LAB — CREATININE, SERUM
Creatinine, Ser: 1.01 mg/dL (ref 0.61–1.24)
GFR calc Af Amer: >60 mL/min (ref >60)
GFR calc non Af Amer: >60 mL/min (ref >60)

## 2018-07-31 LAB — SARS CORONAVIRUS 2 BY RT PCR (HOSPITAL ORDER, PERFORMED IN ~~LOC~~ HOSPITAL LAB): SARS Coronavirus 2: NEGATIVE

## 2018-07-31 MED ORDER — PANTOPRAZOLE SODIUM 40 MG PO TBEC
40.0000 mg | DELAYED_RELEASE_TABLET | Freq: Every day | ORAL | Status: DC
  Administered 2018-07-31 – 2018-08-03 (×4): 40 mg via ORAL
  Filled 2018-07-31 (×4): qty 1

## 2018-07-31 MED ORDER — HYDROCODONE-ACETAMINOPHEN 10-325 MG PO TABS
1.0000 | ORAL_TABLET | ORAL | Status: DC | PRN
  Administered 2018-07-31 – 2018-08-03 (×7): 1 via ORAL
  Filled 2018-07-31 (×7): qty 1

## 2018-07-31 MED ORDER — METOPROLOL TARTRATE 12.5 MG HALF TABLET
12.5000 mg | ORAL_TABLET | Freq: Two times a day (BID) | ORAL | Status: DC
  Administered 2018-08-01 – 2018-08-02 (×4): 12.5 mg via ORAL
  Filled 2018-07-31 (×4): qty 1

## 2018-07-31 MED ORDER — CLOPIDOGREL BISULFATE 75 MG PO TABS
75.0000 mg | ORAL_TABLET | Freq: Every day | ORAL | Status: DC
  Administered 2018-08-01 – 2018-08-03 (×3): 75 mg via ORAL
  Filled 2018-07-31 (×3): qty 1

## 2018-07-31 MED ORDER — LABETALOL HCL 5 MG/ML IV SOLN
40.0000 mg | Freq: Once | INTRAVENOUS | Status: AC
  Administered 2018-07-31: 40 mg via INTRAVENOUS
  Filled 2018-07-31: qty 8

## 2018-07-31 MED ORDER — LABETALOL HCL 5 MG/ML IV SOLN
INTRAVENOUS | Status: AC
  Filled 2018-07-31: qty 4

## 2018-07-31 MED ORDER — LORAZEPAM 1 MG PO TABS
1.0000 mg | ORAL_TABLET | Freq: Four times a day (QID) | ORAL | Status: DC | PRN
  Administered 2018-07-31: 1 mg via ORAL

## 2018-07-31 MED ORDER — LISINOPRIL 20 MG PO TABS
20.0000 mg | ORAL_TABLET | Freq: Two times a day (BID) | ORAL | Status: DC
  Administered 2018-08-01 – 2018-08-03 (×5): 20 mg via ORAL
  Filled 2018-07-31 (×5): qty 1

## 2018-07-31 MED ORDER — FOLIC ACID 1 MG PO TABS
1.0000 mg | ORAL_TABLET | Freq: Every day | ORAL | Status: DC
  Administered 2018-07-31 – 2018-08-03 (×4): 1 mg via ORAL
  Filled 2018-07-31 (×3): qty 1

## 2018-07-31 MED ORDER — LABETALOL HCL 5 MG/ML IV SOLN
40.0000 mg | Freq: Once | INTRAVENOUS | Status: AC
  Administered 2018-07-31: 20 mg via INTRAVENOUS

## 2018-07-31 MED ORDER — ACETAMINOPHEN 325 MG PO TABS
650.0000 mg | ORAL_TABLET | ORAL | Status: DC | PRN
  Administered 2018-08-01: 650 mg via ORAL

## 2018-07-31 MED ORDER — STROKE: EARLY STAGES OF RECOVERY BOOK
Freq: Once | Status: AC
  Administered 2018-07-31: 1
  Filled 2018-07-31: qty 1

## 2018-07-31 MED ORDER — LORAZEPAM 2 MG/ML IJ SOLN: 1.0000 mg | Freq: Four times a day (QID) | INTRAMUSCULAR | Status: DC | PRN

## 2018-07-31 MED ORDER — ENOXAPARIN SODIUM 40 MG/0.4ML ~~LOC~~ SOLN
40.0000 mg | SUBCUTANEOUS | Status: DC
  Administered 2018-07-31: 40 mg via SUBCUTANEOUS
  Filled 2018-07-31: qty 0.4

## 2018-07-31 MED ORDER — VITAMIN B-1 100 MG PO TABS
100.0000 mg | ORAL_TABLET | Freq: Every day | ORAL | Status: DC
  Administered 2018-07-31 – 2018-08-03 (×4): 100 mg via ORAL
  Filled 2018-07-31 (×4): qty 1

## 2018-07-31 MED ORDER — ASPIRIN 325 MG PO TABS
650.0000 mg | ORAL_TABLET | Freq: Once | ORAL | Status: AC
  Administered 2018-08-01: 650 mg via ORAL
  Filled 2018-07-31: qty 2

## 2018-07-31 MED ORDER — THIAMINE HCL 100 MG/ML IJ SOLN
100.0000 mg | Freq: Every day | INTRAMUSCULAR | Status: DC
  Filled 2018-07-31: qty 2

## 2018-07-31 MED ORDER — SODIUM CHLORIDE 0.9 % IV BOLUS
1000.0000 mL | Freq: Once | INTRAVENOUS | Status: AC
  Administered 2018-07-31: 1000 mL via INTRAVENOUS

## 2018-07-31 MED ORDER — LORAZEPAM 1 MG PO TABS: 0.0000 mg | ORAL_TABLET | Freq: Two times a day (BID) | ORAL | Status: DC

## 2018-07-31 MED ORDER — LORAZEPAM 1 MG PO TABS
0.0000 mg | ORAL_TABLET | Freq: Four times a day (QID) | ORAL | Status: AC
  Administered 2018-07-31: 1 mg via ORAL
  Administered 2018-08-01 (×2): 2 mg via ORAL
  Filled 2018-07-31 (×3): qty 2

## 2018-07-31 MED ORDER — ACETAMINOPHEN 650 MG RE SUPP: 650.0000 mg | RECTAL | Status: DC | PRN

## 2018-07-31 MED ORDER — NITROGLYCERIN IN D5W 200-5 MCG/ML-% IV SOLN
20.0000 ug/min | INTRAVENOUS | Status: DC
  Administered 2018-07-31: 20 ug/min via INTRAVENOUS
  Filled 2018-07-31: qty 250

## 2018-07-31 MED ORDER — ACETAMINOPHEN 160 MG/5ML PO SOLN: 650.0000 mg | ORAL | Status: DC | PRN

## 2018-07-31 MED ORDER — ADULT MULTIVITAMIN W/MINERALS CH
1.0000 | ORAL_TABLET | Freq: Every day | ORAL | Status: DC
  Administered 2018-07-31 – 2018-08-03 (×4): 1 via ORAL
  Filled 2018-07-31 (×4): qty 1

## 2018-07-31 MED ORDER — ATORVASTATIN CALCIUM 80 MG PO TABS
80.0000 mg | ORAL_TABLET | Freq: Every evening | ORAL | Status: DC
  Administered 2018-07-31 – 2018-08-02 (×3): 80 mg via ORAL
  Filled 2018-07-31 (×3): qty 1

## 2018-07-31 MED ORDER — ASPIRIN 325 MG PO TABS
325.0000 mg | ORAL_TABLET | Freq: Every day | ORAL | Status: DC
  Administered 2018-08-01 – 2018-08-03 (×3): 325 mg via ORAL
  Filled 2018-07-31 (×3): qty 1

## 2018-07-31 MED ORDER — ASPIRIN 81 MG PO CHEW
81.0000 mg | CHEWABLE_TABLET | Freq: Every day | ORAL | Status: DC
  Administered 2018-07-31: 81 mg via ORAL
  Filled 2018-07-31: qty 1

## 2018-07-31 MED ORDER — HYDRALAZINE HCL 20 MG/ML IJ SOLN
5.0000 mg | Freq: Once | INTRAMUSCULAR | Status: AC
  Administered 2018-07-31: 5 mg via INTRAVENOUS
  Filled 2018-07-31: qty 1

## 2018-07-31 MED ORDER — CLOPIDOGREL BISULFATE 75 MG PO TABS
600.0000 mg | ORAL_TABLET | Freq: Once | ORAL | Status: AC
  Administered 2018-08-01: 600 mg via ORAL
  Filled 2018-07-31: qty 8

## 2018-07-31 NOTE — ED Notes (Signed)
[facility] arrived

## 2018-07-31 NOTE — ED Notes (Signed)
Patient transported to CT 

## 2018-07-31 NOTE — ED Notes (Signed)
R eye: 20/40 L eye: 20/40

## 2018-07-31 NOTE — ED Triage Notes (Signed)
EMS reports yesterday pt was at [facility] around 4pm and started having headache, dizziness, and unsteady gait, blurred vision, and left sided weakness.  Today pt called ems.  EMS reports bp 230/120.  Pt says still having symptoms.  cbg 130.

## 2018-07-31 NOTE — ED Notes (Signed)
PT kept NPO prior to transfer

## 2018-07-31 NOTE — Consult Note (Signed)
Neurology Consultation  Reason for Consult: stroke Referring Physician: Dr/ Jamse Arn  CC: weakness in legs, diffuclty walking, dizziness  History is obtained from: patient, chart   HPI: [name] is a 58 y.o. male past medical history of chronic lower back pain, hypertension, hyperlipidemia, coronary artery disease, presenting to any [facility] hospital emergency room for evaluation of dizziness and weakness in his legs that started around 4 PM on 07/30/2018. The symptom onset was sudden.  He noticed some blurred vision followed by some difficulty walking and was having falls when he attempted to walk.  He could not coordinate his walking and felt clumsy. He went to the store where he had to hold onto the cart to walk.  In the emergency room at any [facility], he was outside the window for IV TPA when he arrived around 10 AM today.  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 Presumably the case was discussed with the on-call neurologist and a recommendation was made to transfer the patient to [facility].  I was notified by the neurology floor charge nurse that the patient is here. History obtained is from the patient and from the chart. He denies prior history of strokes.  He drinks sixpack of beer every day.  Denies smoking or illicit drug use. Family history of stroke-grandfather.  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  Denies any preceding fevers chills.  Denies shortness of breath, cough, chest pain, palpitations, nausea, vomiting, abdominal pain, diarrhea, constipation, bleeding or bruising tendencies. Denies any headache.  Denies double vision.  Denies tingling numbness.  Reports chronic back pain which has been ongoing for many years. Reports recent scratchy throat due to exposure to some spray but no difficulty swallowing.  Does not report any frank  difficulty speaking, but on asking that his speech appeared a little slurred, he said that that is because of his scratchy throat.  Reports that his symptoms of overall improved significantly since the onset yesterday.  LKW: 4 PM on 07/30/2018 tpa given?: no, outside the window Premorbid modified Rankin scale (mRS): 0  ROS: ROS was performed and is negative except as noted in the HPI.  Past Medical History:  Diagnosis Date  . Anxiety   . Arthritis   . Bradycardia    a. During 11/2012 adm: lopressor decreased.  . 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 Coronary artery disease    a. Diag cath 10/2012 for CP/abnormal nuc -> planned PCI s/p LAD atherectomy/DES placement 11/05/12.  Marland Kitchen GERD (gastroesophageal reflux disease)   . Hypercholesteremia   . Hypertension     Family History  Problem Relation Age of Onset  . Heart disease Father   . Heart disease Sister     Social History:   reports that he quit smoking about 11 years ago. His smoking use included cigarettes. He started smoking about 40 years ago. He has a 96.00 pack-year smoking history. He has quit using smokeless tobacco.  His smokeless tobacco use included chew. He reports current alcohol use. He reports that he does not use drugs.  Medications  Current Facility-Administered Medications:  .  acetaminophen (TYLENOL) tablet 650 mg, 650 mg, Oral, Q4H PRN **OR** acetaminophen (TYLENOL) solution 650 mg, 650 mg, Per Tube, Q4H PRN **OR** acetaminophen (TYLENOL) suppository 650  mg, 650 mg, Rectal, Q4H PRN, Bonnell Public Tublu, MD .  aspirin chewable tablet 81 mg, 81 mg, Oral, Daily, Bonnell Public Tublu, MD, 81 mg at 07/31/18 2258 .  atorvastatin (LIPITOR) tablet 80 mg, 80 mg, Oral, QPM, Bonnell Public Tublu, MD, 80 mg at 07/31/18 2258 .  enoxaparin (LOVENOX) injection 40 mg, 40 mg, Subcutaneous, Q24H, Bonnell Public Tublu, MD, 40 mg  at 07/31/18 2222 .  folic acid (FOLVITE) tablet 1 mg, 1 mg, Oral, Daily, Bonnell Public Tublu, MD, 1 mg at 07/31/18 2300 .  HYDROcodone-acetaminophen (NORCO) 10-325 MG per tablet 1 tablet, 1 tablet, Oral, Q4H PRN, Vashti Hey, MD, 1 tablet at 07/31/18 2211 .  [START ON 08/01/2018] lisinopril (ZESTRIL) tablet 20 mg, 20 mg, Oral, BID, Jamse Arn, Dewaine Oats Tublu, MD .  LORazepam (ATIVAN) tablet 1 mg, 1 mg, Oral, Q6H PRN, 1 mg at 07/31/18 2230 **OR** LORazepam (ATIVAN) injection 1 mg, 1 mg, Intravenous, Q6H PRN, Bonnell Public Tublu, MD .  LORazepam (ATIVAN) tablet 0-4 mg, 0-4 mg, Oral, Q6H, 1 mg at 07/31/18 2223 **FOLLOWED BY** [START ON 08/02/2018] LORazepam (ATIVAN) tablet 0-4 mg, 0-4 mg, Oral, Q12H, Bonnell Public Tublu, MD .  Derrill Memo ON 08/01/2018] metoprolol tartrate (LOPRESSOR) tablet 12.5 mg, 12.5 mg, Oral, BID, Jamse Arn, Dewaine Oats Tublu, MD .  multivitamin with minerals tablet 1 tablet, 1 tablet, Oral, Daily, Vashti Hey, MD, 1 tablet at 07/31/18 2258 .  pantoprazole (PROTONIX) EC tablet 40 mg, 40 mg, Oral, Daily, Bonnell Public Tublu, MD, 40 mg at 07/31/18 2257 .  thiamine (VITAMIN B-1) tablet 100 mg, 100 mg, Oral, Daily, 100 mg at 07/31/18 2257 **OR** thiamine (B-1) injection 100 mg, 100 mg, Intravenous, Daily, Jamse Arn, Kyra Searles, MD   Exam: Current vital signs: BP (!) 182/93 (BP Location: Left Arm)   Pulse 75   Temp 97.9 F (36.6 C) (Oral)   Resp 18   Ht 6' (1.829 m)   Wt 72.6 kg   SpO2 100%   BMI 21.71 kg/m  Vital signs in last 24 hours: Temp:  [97.9 F (36.6 C)-98.1 F (36.7 C)] 97.9 F (36.6 C) (07/29 2315) Pulse Rate:  [52-76] 75 (07/29 2315) Resp:  [12-20] 18 (07/29 2315) BP: (137-244)/(93-127) 182/93 (07/29 2315) SpO2:  [95 %-100 %] 100 % (07/29 2315) Weight:  [72.6 kg-75.8 kg] 72.6 kg (07/29 2139)  GENERAL: Awake, alert in NAD HEENT: - Normocephalic and atraumatic, dry mm, no LN++, no Thyromegally LUNGS - Clear to  auscultation bilaterally with no wheezes CV - S1S2 RRR, no m/r/g, equal pulses bilaterally. ABDOMEN - Soft, nontender, nondistended with normoactive BS Ext: warm, well perfused, intact peripheral pulses, no edema  NEURO:  Mental Status: AA&Ox3  Language: speech is mildly dysarthric.  Naming, repetition, fluency, and comprehension intact. Cranial Nerves: PERRL. EOMI, visual fields full, no facial asymmetry, facial sensation intact, hearing intact, tongue/uvula/soft palate midline, normal sternocleidomastoid and trapezius muscle strength. No evidence of tongue atrophy or fibrillations Motor: 5/5 with no drift in any of the 4 extremities Tone: is normal and bulk is normal Sensation- Intact to light touch bilaterally, no extinction on double simultaneous stimulation Coordination: FTN intact bilaterally, no ataxia in BLE. Gait- deferred  NIHSS - 1   Labs I have reviewed labs in epic and the results pertinent to this consultation are:  CBC    Component Value Date/Time   WBC 9.1 07/31/2018 2211   RBC 4.61 07/31/2018 2211   HGB 15.1 07/31/2018 2211   HCT 43.0 07/31/2018 2211   PLT 261 07/31/2018  2211   MCV 93.3 07/31/2018 2211   MCH 32.8 07/31/2018 2211   MCHC 35.1 07/31/2018 2211   RDW 12.1 07/31/2018 2211   LYMPHSABS 1.0 07/31/2018 1104   MONOABS 0.8 07/31/2018 1104   EOSABS 0.0 07/31/2018 1104   BASOSABS 0.0 07/31/2018 1104    CMP     Component Value Date/Time   NA 129 (L) 07/31/2018 1104   K 3.8 07/31/2018 1104   CL 95 (L) 07/31/2018 1104   CO2 23 07/31/2018 1104   GLUCOSE 126 (H) 07/31/2018 1104   BUN 13 07/31/2018 1104   CREATININE 1.01 07/31/2018 2211   CREATININE 1.23 10/28/2012 1558   CALCIUM 9.7 07/31/2018 1104   PROT 8.4 (H) 07/31/2018 1104   ALBUMIN 4.8 07/31/2018 1104   AST 19 07/31/2018 1104   ALT 12 07/31/2018 1104   ALKPHOS 44 07/31/2018 1104   BILITOT 1.0 07/31/2018 1104   GFRNONAA >60 07/31/2018 2211   GFRAA >60 07/31/2018 2211    Imaging I have  reviewed the images obtained:  CT-scan of the brain-no bleed.  No acute changes.  MRI examination of the brain-acute infarction affecting right pons and mid pons along with a punctate area in the left pons as well.  No bleed.  MRA head with occlusion of both vertebral arteries V4 segments and of the basilar artery on the MRA with P-comm's supplying the basilar tip.   Assessment: 58 year old man with past medical history as above presenting for evaluation of weakness, difficulty walking, blurred vision, with MRI showing bilateral pontine strokes worse on the right and an MRI of the head showing bilateral vertebral artery V4 segment occlusion and occlusion of the basilar artery with the tip of the basilar being supplied by the patent posterior communicating arteries. Likely atherosclerotic disease. He is outside the window for IV TPA when he presented to the outside hospital hence no TPA was offered. He was sent to [facility] for further evaluation and possible intervention.   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  Might consider heparin drip after the CTA. Allow for permissive hypertension- preferably keep blood pressures over 140 and do not treat unless they go over 220. 2D echo Frequent neurochecks PT OT Speech therapy N.p.o. until cleared by bedside swallow evaluation Stroke neurology will continue to follow with you   -- Amie [place], MD [facility] Pager: [phone] If 7pm to 7am, please call on call as listed on AMION.

## 2018-07-31 NOTE — H&P (Signed)
History and Physical:    [name]   AOZ:[id number] DOB: Apr 25, 1960 DOA: 07/31/2018  Referring MD/provider: Dr. Wilson Singer PCP: [name], MD   Patient coming from: Home  Chief Complaint: Sudden onset gait instability yesterday  History of Present Illness:   [name] is an 58 y.o. male with past medical history significant for HTN, HL, daily alcohol use and previous CAD was in his usual state of health until yesterday when he noted sudden onset difficulty walking.  Patient states he had gotten into his car without difficulty however when he tried to get out of his car to go to [place] he had difficulty walking.  "I could not get my legs to do what they needed to do".  He states he was glad he had a cart to lean on and was able to complete his shopping at [employer].  He was subsequently able to get into his car and drive to [employer] where he again had difficulty walking and needed to have a cart to rely on.  Patient presented to the ED today because he continued to have difficulty walking.  He also noted he had difficulty with his vision although he is unable to state exactly what that was.  He thinks that he may have had blurry vision but was unsure.  Does not think he had double vision.  He notes this is now resolved.  Patient notes his last drink was yesterday.  He drinks 6-12 beers a day.  He has never had the DTs as far as he knows however does admit he has the shakes when he quits drinking.  Patient denies any fevers or chills.  No known exposure to COVID.  No cough or shortness of breath.  No acute intercurrent illness.  No chest pain.   ED Course:  The patient underwent an MRI which did show an acute infarction of the right pons.  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  This was discussed with our neurologist here who recommended transfer to [facility] for further evaluation of posterior circulation  stenoses.  In the ED patient was noted to be hypertensive and was started on a nitroglycerin drip for management of blood pressure after labetalol and hydralazine were ineffective.  ROS:   ROS   Review of Systems: General: No fever, chills, weight changes Skin: No rashes, lesions, wounds Endocrine: no heat/cold intolerance, no polyuria Respiratory: No cough,, shortness of breath, hemoptysis Cardiovascular: No palpitations, chest pain GI: No nausea, vomiting, diarrhea, constipation GU: No dysuria, increased frequency Musculoskeletal: No back pain, joint pain Blood/lymphatics: No easy bruising, bleeding Mood/affect: No anxiety/depression    Past Medical History:   Past Medical History:  Diagnosis Date   Anxiety    Arthritis    Bradycardia    a. During 11/2012 adm: lopressor decreased.   Chronic lower back pain    "I work Architect; messed back up ~ 30 yr ago; paralyzed for 2 days then" (11/05/2012)   Coronary artery disease    a. Diag cath 10/2012 for CP/abnormal nuc -> planned PCI s/p LAD atherectomy/DES placement 11/05/12.   GERD (gastroesophageal reflux disease)    Hypercholesteremia    Hypertension     Past Surgical History:   Past Surgical History:  Procedure Laterality Date   CARDIAC CATHETERIZATION  10/29/2012   CORONARY ANGIOPLASTY WITH STENT PLACEMENT  11/05/2012   HEMORRHOID SURGERY  ~ 2010  INSERTION OF MESH N/A 06/02/2015   Procedure: INSERTION OF MESH;  Surgeon: [name], MD;  Location: [facility];  Service: General;  Laterality: N/A;   LEFT HEART CATHETERIZATION WITH CORONARY ANGIOGRAM N/A 10/30/2012   Procedure: LEFT HEART CATHETERIZATION WITH CORONARY ANGIOGRAM;  Surgeon: [name], MD;  Location: [facility];  Service: Cardiovascular;  Laterality: N/A;   PERCUTANEOUS CORONARY ROTOBLATOR INTERVENTION (PCI-R) N/A 11/05/2012   Procedure: PERCUTANEOUS CORONARY ROTOBLATOR INTERVENTION (PCI-R);  Surgeon: [name], MD;  Location:  [facility] CATH LAB;  Service: Cardiovascular;  Laterality: N/A;   UMBILICAL HERNIA REPAIR N/A 06/02/2015   Procedure: UMBILICAL HERNIORRHAPHY WITH MESH;  Surgeon: [name], MD;  Location: [facility];  Service: General;  Laterality: N/A;    Social History:   Social History   Socioeconomic History   Marital status: Married    Spouse name: Not on file   Number of children: Not on file   Years of education: Not on file   Highest education level: Not on file  Occupational History   Not on file  Social Needs   Financial resource strain: Not on file   Food insecurity    Worry: Not on file    Inability: Not on file   Transportation needs    Medical: Not on file    Non-medical: Not on file  Tobacco Use   Smoking status: Former Smoker    Packs/day: 3.00    Years: 32.00    Pack years: 96.00    Types: Cigarettes    Start date: 08/29/1977    Quit date: 11/29/2006    Years since quitting: 11.6   Smokeless tobacco: Former Systems developer    Types: Chew   Tobacco comment: 11/05/2012 "chewed tobacco when I play ball; aien't chewed since age 49"  Substance and Sexual Activity   Alcohol use: Yes    Alcohol/week: 0.0 standard drinks    Comment: "couple of beers daily"   Drug use: No   Sexual activity: Not Currently  Lifestyle   Physical activity    Days per week: Not on file    Minutes per session: Not on file   Stress: Not on file  Relationships   Social connections    Talks on phone: Not on file    Gets together: Not on file    Attends religious service: Not on file    Active member of club or organization: Not on file    Attends meetings of clubs or organizations: Not on file    Relationship status: Not on file   Intimate partner violence    Fear of current or ex partner: Not on file    Emotionally abused: Not on file    Physically abused: Not on file    Forced sexual activity: Not on file  Other Topics Concern   Not on file  Social History Narrative   Not on file     Allergies   Prednisone  Family history:   Family History  Problem Relation Age of Onset   Heart disease Father    Heart disease Sister     Current Medications:   Prior to Admission medications   Medication Sig Start Date End Date Taking? Authorizing Provider  aspirin 81 MG tablet Take 1 tablet (81 mg total) by mouth daily. 11/06/12  Yes Dunn, Dayna N, PA-C  atorvastatin (LIPITOR) 80 MG tablet Take 1 tablet (80 mg total) by mouth every evening. 10/23/17  Yes Herminio Commons, MD  HYDROcodone-acetaminophen [facility]) [phone]  MG tablet Take 1 tablet by mouth every 4 (four) hours as needed. 06/02/15  Yes [name], MD  lisinopril (PRINIVIL,ZESTRIL) 20 MG tablet Take 20 mg by mouth 2 (two) times daily.   Yes [provider]  metoprolol tartrate (LOPRESSOR) 25 MG tablet TAKE 1/2 TABLET BY MOUTH TWICE DAILY 01/25/15  Yes Herminio Commons, MD  nitroGLYCERIN (NITROSTAT) 0.4 MG SL tablet Place 1 tablet (0.4 mg total) under the tongue every 5 (five) minutes as needed for chest pain (CP or SOB). 07/27/14  Yes Herminio Commons, MD  omeprazole (PRILOSEC) 20 MG capsule Take 20 mg by mouth daily.  07/22/18  Yes [provider]    Physical Exam:   Vitals:   07/31/18 1820 07/31/18 1830 07/31/18 2000 07/31/18 2139  BP: (!) 167/103 (!) 177/101 (!) 179/108 (!) 229/127  Pulse: 66 65 68 72  Resp:   14 18  Temp: 98 F (36.7 C)   98.1 F (36.7 C)  TempSrc:    Oral  SpO2: 99% 98% 98% 98%  Weight:      Height:         Physical Exam: Blood pressure (!) 229/127, pulse 72, temperature 98.1 F (36.7 C), temperature source Oral, resp. rate 18, height 6' (1.829 m), weight 75.8 kg, SpO2 98 %. Gen: Chronically ill-appearing man looking much older than stated age lying in bed in no acute distress. Eyes: Sclerae anicteric. Conjunctiva mildly injected. Chest: Moderately good air entry bilaterally with no adventitious sounds.  CV: Distant, regular, 2/6 systolic murmur  without radiation  abdomen: NABS, soft, nondistended, nontender.  Extremities: No edema.  Skin: Warm and dry. No rashes, lesions or wounds. Neuro: Alert and oriented times 3; no difficulty with speech, no dysarthria or problems with comprehension.  Patient was able to move all extremities equally.  He did have bilateral weakness of his hip flexors although I suspect that is not a focal finding.  His upper extremities were 5 out of 5 bilaterally in strength.  He had some mild dysmetria bilaterally.  Able to cross midline.   Data Review:    Labs: Basic Metabolic Panel: Recent Labs  Lab 07/31/18 1104  NA 129*  K 3.8  CL 95*  CO2 23  GLUCOSE 126*  BUN 13  CREATININE 0.83  CALCIUM 9.7   Liver Function Tests: Recent Labs  Lab 07/31/18 1104  AST 19  ALT 12  ALKPHOS 44  BILITOT 1.0  PROT 8.4*  ALBUMIN 4.8   No results for input(s): LIPASE, AMYLASE in the last 168 hours. No results for input(s): AMMONIA in the last 168 hours. CBC: Recent Labs  Lab 07/31/18 1104  WBC 8.4  NEUTROABS 6.5  HGB 15.0  HCT 44.3  MCV 95.7  PLT 262   Cardiac Enzymes: No results for input(s): CKTOTAL, CKMB, CKMBINDEX, TROPONINI in the last 168 hours.  BNP (last 3 results) No results for input(s): PROBNP in the last 8760 hours. CBG: No results for input(s): GLUCAP in the last 168 hours.  Urinalysis    Component Value Date/Time   COLORURINE YELLOW 07/31/2018 1048   APPEARANCEUR CLEAR 07/31/2018 1048   LABSPEC 1.012 07/31/2018 1048   PHURINE 8.0 07/31/2018 [place] 07/31/2018 1048   HGBUR NEGATIVE 07/31/2018 [place] 07/31/2018 1048   KETONESUR NEGATIVE 07/31/2018 1048   PROTEINUR 30 (A) 07/31/2018 1048   NITRITE NEGATIVE 07/31/2018 1048   LEUKOCYTESUR NEGATIVE 07/31/2018 1048      Radiographic Studies: Dg Chest  2 View  Result Date: 07/31/2018 CLINICAL DATA:  Weakness and dizziness. EXAM: CHEST - 2 VIEW COMPARISON:  September 03, 2012. FINDINGS:  There is no appreciable edema or consolidation. Heart size and pulmonary vascularity are normal. No adenopathy. No bone lesions. IMPRESSION: No edema or consolidation.  Stable cardiac silhouette. Electronically Signed   By: Lowella Grip III M.D.   On: 07/31/2018 12:32   Ct Head Wo Contrast  Result Date: 07/31/2018 CLINICAL DATA:  Altered level of consciousness, history hypertension, coronary artery disease EXAM: CT HEAD WITHOUT CONTRAST TECHNIQUE: Contiguous axial images were obtained from the base of the skull through the vertex without intravenous contrast. Sagittal and coronal MPR images reconstructed from axial data set. COMPARISON:  None FINDINGS: Brain: Generalized atrophy. Normal ventricular morphology. No midline shift or mass effect. Small vessel chronic ischemic changes of deep cerebral white matter. No intracranial hemorrhage, mass lesion, evidence of acute infarction, or extra-axial fluid collection. Vascular: Atherosclerotic calcification of internal carotid and vertebral arteries at skull base Skull: Intact Sinuses/Orbits: Clear Other: N/A IMPRESSION: Atrophy with small vessel chronic ischemic changes of deep cerebral white matter. No acute intracranial abnormalities. Extensive atherosclerotic calcifications at the skull base particularly of the internal carotid arteries. Electronically Signed   By: Lavonia Dana M.D.   On: 07/31/2018 12:26   Mr Angio Head Wo Contrast  Result Date: 07/31/2018 CLINICAL DATA:  Headache, dizziness and gait disturbance. Blurred vision. Left-sided weakness. Symptoms began yesterday. EXAM: MRI HEAD WITHOUT CONTRAST MRA HEAD WITHOUT CONTRAST TECHNIQUE: Multiplanar, multiecho pulse sequences of the brain and surrounding structures were obtained without intravenous contrast. Angiographic images of the head were obtained using MRA technique without contrast. COMPARISON:  CT 07/31/2018 FINDINGS: MRI HEAD FINDINGS Brain: Diffusion imaging shows multiple foci of acute  infarction affecting the pons, more to the right of midline than the left. No other acute infarction. Elsewhere, there is no cerebellar insult. Cerebral hemispheres show mild chronic small-vessel change of the deep and subcortical white matter. No large vessel territory infarction. No mass lesion, hemorrhage, hydrocephalus or extra-axial collection. Vascular: Major vessels at the base of the brain show flow. Skull and upper cervical spine: Negative Sinuses/Orbits: Clear/normal Other: None MRA HEAD FINDINGS Both internal carotid arteries are patent through the skull base and siphon regions. No siphon stenosis. The anterior and middle cerebral vessels are patent without proximal stenosis, aneurysm or vascular malformation. Both vertebral arteries show flow at the foramen magnum level and give supply to both posteroinferior cerebellar arteries. No flow is seen distal to that in the V4 segments reaching the basilar. No flow is seen in the basilar artery until the basilar tip, where there is reconstituted flow supplying both superior cerebellar and posterior cerebral arteries. Patent posterior communicating arteries are responsible for this supply. 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 Both posteroinferior cerebellar arteries receive flow. Patent posterior communicating arteries on each side reconstitute the basilar tip and give supply to the superior cerebellar and posterior cerebral arteries. Electronically Signed   By: Nelson Chimes M.D.   On: 07/31/2018 14:45   Mr Brain Wo Contrast  Result Date: 07/31/2018 CLINICAL DATA:  Headache, dizziness and gait disturbance. Blurred vision. Left-sided weakness. Symptoms began yesterday. EXAM: MRI HEAD WITHOUT CONTRAST MRA HEAD WITHOUT CONTRAST TECHNIQUE: Multiplanar,  multiecho pulse sequences of the brain and surrounding structures were  obtained without intravenous contrast. Angiographic images of the head were obtained using MRA technique without contrast. COMPARISON:  CT 07/31/2018 FINDINGS: MRI HEAD FINDINGS Brain: Diffusion imaging shows multiple foci of acute infarction affecting the pons, more to the right of midline than the left. No other acute infarction. Elsewhere, there is no cerebellar insult. Cerebral hemispheres show mild chronic small-vessel change of the deep and subcortical white matter. No large vessel territory infarction. No mass lesion, hemorrhage, hydrocephalus or extra-axial collection. Vascular: Major vessels at the base of the brain show flow. Skull and upper cervical spine: Negative Sinuses/Orbits: Clear/normal Other: None MRA HEAD FINDINGS Both internal carotid arteries are patent through the skull base and siphon regions. No siphon stenosis. The anterior and middle cerebral vessels are patent without proximal stenosis, aneurysm or vascular malformation. Both vertebral arteries show flow at the foramen magnum level and give supply to both posteroinferior cerebellar arteries. No flow is seen distal to that in the V4 segments reaching the basilar. No flow is seen in the basilar artery until the basilar tip, where there is reconstituted flow supplying both superior cerebellar and posterior cerebral arteries. Patent posterior communicating arteries are responsible for this supply. 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 Both posteroinferior cerebellar arteries receive flow. Patent posterior communicating arteries on each side reconstitute the basilar tip and give supply to the superior cerebellar and posterior cerebral arteries. Electronically Signed    By: Nelson Chimes M.D.   On: 07/31/2018 14:45    EKG: Independently reviewed.  Junctional rhythm at 56.  Normal axis.  T wave inversions V1 and V2.   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  ACUTE STROKE Patient with acute infarction of the pons right greater than left. He also has occlusion of both vertebral arteries and the 8 V4 segment of the basilar artery Discussed with neurology here who recommended admission to [facility] for further evaluation of the posterior circulation stenoses. Patient had been started on nitroglycerin drip for blood pressure control however SBP right now is 150, will titrate nitroglycerin off and allow for permissive hypertension to systolics up to 474. Patient is on aspirin 81 mg daily which I am continuing, however optimization of antiplatelet agents can be discussed with neurology in the morning as he may benefit from Plavix. Continue atorvastatin 80 Will need a neurology consult in the morning at [facility].  HYPONATREMIA  Very possibly secondary to some beer potomania.  Does not appear to be symptomatic from this at present.  He received IV hydration in the ED which I am not continuing given his hypertension.  Can repeat sodium in the morning.  He can receive some more normal saline in the morning once he is restarted his antihypertensives.  HTN Patient has been placed on a nitroglycerin drip but SBP is now 150 as he will need permissive hypertension. Will start lisinopril metoprolol tomorrow, however these can be restarted tonight if his SBP gets higher than 200.  ALCOHOL ABUSE Patient states that his last drink was yesterday, usually  drinks 6-12  beers a day.  States he has never had active DTs however does get "the shakes".  Will place patient on alcohol withdrawal protocol with p.o. PRN lorazepam.  HL Continue atorvastatin 80 daily as noted above.   Other information:   DVT prophylaxis: Lovenox ordered. Code Status: Full code. Disposition Plan: Home Consults called: Neurology in the morning Admission status: Observation  The medical decision making on this patient was of high complexity and the patient is at high risk for clinical deterioration, therefore this is a level 3 visit.   Dewaine Oats Tublu Bora Bost [facility]  If 7PM-7AM, please contact night-coverage [email or web address] Password [facility] 07/31/2018, 9:44 PM

## 2018-07-31 NOTE — ED Notes (Signed)
Verbal order to stop Nitro drip via Tublu MD. MD wants pt's systolic BP to be between 180-200

## 2018-07-31 NOTE — ED Provider Notes (Addendum)
[facility] EMERGENCY DEPARTMENT Provider Note   CSN: [id number] Arrival date & time: 07/31/18  1000     History   Chief Complaint Chief Complaint  Patient presents with  . Weakness    HPI [name] is a 58 y.o. male.     Patient states around 4:00 yesterday afternoon he had some dizziness and weakness in his left leg.  This has not improved.  Patient states he has difficulty walking.  The history is provided by the patient. No language interpreter was used.  Weakness Severity:  Moderate Onset quality:  Sudden Timing:  Constant Progression:  Waxing and waning Chronicity:  New Context: not alcohol use   Relieved by:  Nothing Worsened by:  Nothing Ineffective treatments:  None tried Associated symptoms: no abdominal pain, no chest pain, no cough, no diarrhea, no frequency, no headaches and no seizures     Past Medical History:  Diagnosis Date  . Anxiety   . Arthritis   . Bradycardia    a. During 11/2012 adm: lopressor decreased.  . 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 Coronary artery disease    a. Diag cath 10/2012 for CP/abnormal nuc -> planned PCI s/p LAD atherectomy/DES placement 11/05/12.  Marland Kitchen GERD (gastroesophageal reflux disease)   . Hypercholesteremia   . Hypertension     Patient Active Problem List   Diagnosis Date Noted  . Rotator cuff syndrome of right shoulder 02/20/2013  . Bradycardia 11/06/2012  . Coronary artery disease   . Abnormal finding on cardiovascular stress test 09/25/2012  . Hypertension 09/03/2012  . Chest pain 09/03/2012  . Alcohol abuse, daily use 09/03/2012  . Hyperlipidemia 09/03/2012  . Hyponatremia 09/03/2012    Past Surgical History:  Procedure Laterality Date  . CARDIAC CATHETERIZATION  10/29/2012  . CORONARY ANGIOPLASTY WITH STENT PLACEMENT  11/05/2012  . HEMORRHOID SURGERY  ~ 2010  . INSERTION OF MESH N/A 06/02/2015   Procedure:  INSERTION OF MESH;  Surgeon: [name], MD;  Location: [facility];  Service: General;  Laterality: N/A;  . LEFT HEART CATHETERIZATION WITH CORONARY ANGIOGRAM N/A 10/30/2012   Procedure: LEFT HEART CATHETERIZATION WITH CORONARY ANGIOGRAM;  Surgeon: [name], MD;  Location: [facility] CATH [facility];  Service: Cardiovascular;  Laterality: N/A;  . PERCUTANEOUS CORONARY ROTOBLATOR INTERVENTION (PCI-R) N/A 11/05/2012   Procedure: PERCUTANEOUS CORONARY ROTOBLATOR INTERVENTION (PCI-R);  Surgeon: [name], MD;  Location: [facility] C[facility];  Service: Cardiovascular;  Laterality: N/A;  . UMBILICAL HERNIA REPAIR N/A 06/02/2015   Procedure: UMBILICAL HERNIORRHAPHY WITH MESH;  Surgeon: [name], MD;  Location: [facility];  Service: General;  Laterality: N/A;        Home Medications    Prior to Admission medications   Medication Sig Start Date End Date Taking? Authorizing Provider  aspirin 81 MG tablet Take 1 tablet (81 mg total) by mouth daily. 11/06/12  Yes Dunn, Dayna N, PA-C  atorvastatin (LIPITOR) 80 MG tablet Take 1 tablet (80 mg total) by mouth every evening. 10/23/17  Yes Herminio Commons, MD  HYDROcodone-acetaminophen (NORCO) 10-325 MG tablet Take 1 tablet by mouth every 4 (four) hours as needed. 06/02/15  Yes [name], MD  lisinopril (PRINIVIL,ZESTRIL) 20 MG tablet Take 20 mg by mouth 2 (two) times daily.   Yes [provider]  metoprolol tartrate (LOPRESSOR) 25 MG tablet TAKE 1/2 TABLET BY MOUTH TWICE DAILY 01/25/15  Yes Bronson Ing,  Lenise Herald, MD  nitroGLYCERIN (NITROSTAT) 0.4 MG SL tablet Place 1 tablet (0.4 mg total) under the tongue every 5 (five) minutes as needed for chest pain (CP or SOB). 07/27/14  Yes Herminio Commons, MD  omeprazole (PRILOSEC) 20 MG capsule Take 20 mg by mouth daily.  07/22/18  Yes [provider]    Family History Family History  Problem Relation Age of Onset  . Heart disease Father   . Heart disease Sister     Social History Social History    Tobacco Use  . Smoking status: Former Smoker    Packs/day: 3.00    Years: 32.00    Pack years: 96.00    Types: Cigarettes    Start date: 08/29/1977    Quit date: 11/29/2006    Years since quitting: 11.6  . Smokeless tobacco: Former Systems developer    Types: Chew  . Tobacco comment: 11/05/2012 "chewed tobacco when I play ball; aien't chewed since age 48"  Substance Use Topics  . Alcohol use: Yes    Alcohol/week: 0.0 standard drinks    Comment: "couple of beers daily"  . Drug use: No     Allergies   Prednisone   Review of Systems Review of Systems  Constitutional: Negative for appetite change and fatigue.  HENT: Negative for congestion, ear discharge and sinus pressure.        Blurred vision  Eyes: Negative for discharge.  Respiratory: Negative for cough.   Cardiovascular: Negative for chest pain.  Gastrointestinal: Negative for abdominal pain and diarrhea.  Genitourinary: Negative for frequency and hematuria.  Musculoskeletal: Negative for back pain.  Skin: Negative for rash.  Neurological: Positive for weakness. Negative for seizures and headaches.  Psychiatric/Behavioral: Negative for hallucinations.     Physical Exam Updated Vital Signs BP (!) 201/105   Pulse (!) 59   Temp 98 F (36.7 C) (Oral)   Resp 15   Ht 6' (1.829 m)   Wt 75.8 kg   SpO2 98%   BMI 22.65 kg/m   Physical Exam Constitutional:      Appearance: Normal appearance. He is well-developed.  HENT:     Head: Normocephalic.  Eyes:     General: No scleral icterus.    Conjunctiva/sclera: Conjunctivae normal.     Comments: Pupils equal round reactive light accommodation extraocular muscles intact.  Patient has no visual field defect.  Patient blurred vision improved with treatment of blood pressure.  His visual acuity was then 20/40 in both eyes  Neck:     Musculoskeletal: Neck supple.     Thyroid: No thyromegaly.  Cardiovascular:     Rate and Rhythm: Normal rate and regular rhythm.     Heart sounds:  No murmur. No friction rub. No gallop.   Pulmonary:     Breath sounds: No stridor. No wheezing or rales.  Chest:     Chest wall: No tenderness.  Abdominal:     General: There is no distension.     Tenderness: There is no abdominal tenderness. There is no rebound.  Musculoskeletal: Normal range of motion.     Comments: Patient with weakness in that left leg.  Lymphadenopathy:     Cervical: No cervical adenopathy.  Skin:    Findings: No erythema or rash.  Neurological:     Mental Status: He is alert and oriented to person, place, and time.     Motor: No abnormal muscle tone.     Coordination: Coordination normal.  Psychiatric:  Behavior: Behavior normal.      ED Treatments / Results  Labs (all labs ordered are listed, but only abnormal results are displayed) Labs Reviewed  COMPREHENSIVE METABOLIC PANEL - Abnormal; Notable for the following components:      Result Value   Sodium 129 (*)    Chloride 95 (*)    Glucose, Bld 126 (*)    Total Protein 8.4 (*)    All other components within normal limits  URINALYSIS, ROUTINE W REFLEX MICROSCOPIC - Abnormal; Notable for the following components:   Protein, ur 30 (*)    All other components within normal limits  SARS CORONAVIRUS 2 (HOSPITAL ORDER, [facility])  CBC WITH DIFFERENTIAL/PLATELET  TROPONIN I (HIGH SENSITIVITY)  TROPONIN I (HIGH SENSITIVITY)    EKG None  Radiology Dg Chest 2 View  Result Date: 07/31/2018 CLINICAL DATA:  Weakness and dizziness. EXAM: CHEST - 2 VIEW COMPARISON:  September 03, 2012. FINDINGS: There is no appreciable edema or consolidation. Heart size and pulmonary vascularity are normal. No adenopathy. No bone lesions. IMPRESSION: No edema or consolidation.  Stable cardiac silhouette. Electronically Signed   By: Lowella Grip III M.D.   On: 07/31/2018 12:32   Ct Head Wo Contrast  Result Date: 07/31/2018 CLINICAL DATA:  Altered level of consciousness, history  hypertension, coronary artery disease EXAM: CT HEAD WITHOUT CONTRAST TECHNIQUE: Contiguous axial images were obtained from the base of the skull through the vertex without intravenous contrast. Sagittal and coronal MPR images reconstructed from axial data set. COMPARISON:  None FINDINGS: Brain: Generalized atrophy. Normal ventricular morphology. No midline shift or mass effect. Small vessel chronic ischemic changes of deep cerebral white matter. No intracranial hemorrhage, mass lesion, evidence of acute infarction, or extra-axial fluid collection. Vascular: Atherosclerotic calcification of internal carotid and vertebral arteries at skull base Skull: Intact Sinuses/Orbits: Clear Other: N/A IMPRESSION: Atrophy with small vessel chronic ischemic changes of deep cerebral white matter. No acute intracranial abnormalities. Extensive atherosclerotic calcifications at the skull base particularly of the internal carotid arteries. Electronically Signed   By: Lavonia Dana M.D.   On: 07/31/2018 12:26    Procedures Procedures (including critical care time)  Medications Ordered in ED Medications  labetalol (NORMODYNE) injection 40 mg (has no administration in time range)  hydrALAZINE (APRESOLINE) injection 5 mg (5 mg Intravenous Given 07/31/18 1128)  sodium chloride 0.9 % bolus 1,000 mL (0 mLs Intravenous Stopped 07/31/18 1305)  labetalol (NORMODYNE) injection 40 mg (40 mg Intravenous Given 07/31/18 1302)     Initial Impression / Assessment and Plan / ED Course  I have reviewed the triage vital signs and the nursing notes.  Pertinent labs & imaging results that were available during my care of the patient were reviewed by me and considered in my medical decision making (see chart for details).    CRITICAL CARE Performed by: Milton Ferguson Total critical care time: 45 minutes Critical care time was exclusive of separately billable procedures and treating other patients. Critical care was necessary to treat or  prevent imminent or life-threatening deterioration. Critical care was time spent personally by me on the following activities: development of treatment plan with patient and/or surrogate as well as nursing, discussions with consultants, evaluation of patient's response to treatment, examination of patient, obtaining history from patient or surrogate, ordering and performing treatments and interventions, ordering and review of laboratory studies, ordering and review of radiographic studies, pulse oximetry and re-evaluation of patient's condition.     Suspect patient has  a stroke.  No large vessel stroke.  CT scan of the head negative.  Patient's hypertension has been treated with hydralazine and 2 doses of labetalol.  MRI pending  Final Clinical Impressions(s) / ED Diagnoses   Final diagnoses:  None    ED Discharge Orders    None       Milton Ferguson, MD 07/31/18 1428    Milton Ferguson, MD 07/31/18 1930

## 2018-08-01 ENCOUNTER — Observation Stay (HOSPITAL_COMMUNITY): Payer: BC Managed Care – PPO

## 2018-08-01 ENCOUNTER — Ambulatory Visit (HOSPITAL_COMMUNITY): Payer: BC Managed Care – PPO

## 2018-08-01 ENCOUNTER — Encounter (HOSPITAL_COMMUNITY): Payer: Self-pay

## 2018-08-01 DIAGNOSIS — I679 Cerebrovascular disease, unspecified: Secondary | ICD-10-CM | POA: Diagnosis not present

## 2018-08-01 DIAGNOSIS — K219 Gastro-esophageal reflux disease without esophagitis: Secondary | ICD-10-CM | POA: Diagnosis present

## 2018-08-01 DIAGNOSIS — R29701 NIHSS score 1: Secondary | ICD-10-CM | POA: Diagnosis present

## 2018-08-01 DIAGNOSIS — Z79899 Other long term (current) drug therapy: Secondary | ICD-10-CM | POA: Diagnosis not present

## 2018-08-01 DIAGNOSIS — I10 Essential (primary) hypertension: Secondary | ICD-10-CM | POA: Diagnosis present

## 2018-08-01 DIAGNOSIS — E871 Hypo-osmolality and hyponatremia: Secondary | ICD-10-CM | POA: Diagnosis present

## 2018-08-01 DIAGNOSIS — I6322 Cerebral infarction due to unspecified occlusion or stenosis of basilar arteries: Secondary | ICD-10-CM | POA: Diagnosis not present

## 2018-08-01 DIAGNOSIS — F1010 Alcohol abuse, uncomplicated: Secondary | ICD-10-CM | POA: Diagnosis present

## 2018-08-01 DIAGNOSIS — Z87891 Personal history of nicotine dependence: Secondary | ICD-10-CM | POA: Diagnosis not present

## 2018-08-01 DIAGNOSIS — Z20828 Contact with and (suspected) exposure to other viral communicable diseases: Secondary | ICD-10-CM | POA: Diagnosis present

## 2018-08-01 DIAGNOSIS — E7800 Pure hypercholesterolemia, unspecified: Secondary | ICD-10-CM | POA: Diagnosis present

## 2018-08-01 DIAGNOSIS — E785 Hyperlipidemia, unspecified: Secondary | ICD-10-CM | POA: Diagnosis present

## 2018-08-01 DIAGNOSIS — Z955 Presence of coronary angioplasty implant and graft: Secondary | ICD-10-CM | POA: Diagnosis not present

## 2018-08-01 DIAGNOSIS — Z7982 Long term (current) use of aspirin: Secondary | ICD-10-CM | POA: Diagnosis not present

## 2018-08-01 DIAGNOSIS — I639 Cerebral infarction, unspecified: Secondary | ICD-10-CM | POA: Diagnosis not present

## 2018-08-01 DIAGNOSIS — R4182 Altered mental status, unspecified: Secondary | ICD-10-CM | POA: Diagnosis present

## 2018-08-01 DIAGNOSIS — I6503 Occlusion and stenosis of bilateral vertebral arteries: Secondary | ICD-10-CM | POA: Diagnosis not present

## 2018-08-01 DIAGNOSIS — I161 Hypertensive emergency: Secondary | ICD-10-CM | POA: Diagnosis present

## 2018-08-01 DIAGNOSIS — I2510 Atherosclerotic heart disease of native coronary artery without angina pectoris: Secondary | ICD-10-CM | POA: Diagnosis present

## 2018-08-01 DIAGNOSIS — I63213 Cerebral infarction due to unspecified occlusion or stenosis of bilateral vertebral arteries: Secondary | ICD-10-CM | POA: Diagnosis present

## 2018-08-01 DIAGNOSIS — I6523 Occlusion and stenosis of bilateral carotid arteries: Secondary | ICD-10-CM | POA: Diagnosis not present

## 2018-08-01 DIAGNOSIS — Z823 Family history of stroke: Secondary | ICD-10-CM | POA: Diagnosis not present

## 2018-08-01 LAB — LIPID PANEL
Cholesterol: 194 mg/dL (ref 0–200)
HDL: 73 mg/dL (ref >40)
LDL Cholesterol: 101 mg/dL — ABNORMAL HIGH (ref 0–99)
Total CHOL/HDL Ratio: 2.7 RATIO
Triglycerides: 98 mg/dL (ref <150)
VLDL: 20 mg/dL (ref 0–40)

## 2018-08-01 LAB — HEMOGLOBIN A1C
Hgb A1c MFr Bld: 5.4 % (ref 4.8–5.6)
Mean Plasma Glucose: 108.28 mg/dL

## 2018-08-01 LAB — ECHOCARDIOGRAM COMPLETE
Height: 72 in
Weight: 2560.86 oz

## 2018-08-01 LAB — HIV ANTIBODY (ROUTINE TESTING W REFLEX): HIV Screen 4th Generation wRfx: NONREACTIVE

## 2018-08-01 MED ORDER — HYDRALAZINE HCL 20 MG/ML IJ SOLN
5.0000 mg | Freq: Four times a day (QID) | INTRAMUSCULAR | Status: DC | PRN
  Administered 2018-08-01 – 2018-08-02 (×2): 5 mg via INTRAVENOUS
  Filled 2018-08-01 (×3): qty 1

## 2018-08-01 MED ORDER — IOHEXOL 350 MG/ML SOLN
75.0000 mL | Freq: Once | INTRAVENOUS | Status: AC | PRN
  Administered 2018-08-01: 75 mL via INTRAVENOUS

## 2018-08-01 MED ORDER — HYDRALAZINE HCL 20 MG/ML IJ SOLN: 5.0000 mg | Freq: Four times a day (QID) | INTRAMUSCULAR | Status: DC | PRN

## 2018-08-01 MED ORDER — ENOXAPARIN SODIUM 40 MG/0.4ML ~~LOC~~ SOLN
40.0000 mg | SUBCUTANEOUS | Status: DC
  Administered 2018-08-02: 40 mg via SUBCUTANEOUS
  Filled 2018-08-01: qty 0.4

## 2018-08-01 NOTE — Progress Notes (Addendum)
CTA head/neck completed.   IMPRESSION: 1. 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 Basilar tip is perfused via small bilateral posterior communicating arteries, with perfusion of the bilateral PCAs and SCAs. 2. Atheromatous stenoses about the carotid bifurcations/proximal ICAs bilaterally, with up to 70% narrowing on the right and 65% narrowing on the left. Additional approximate 60% stenosis involving the distal left ICA at the cervical-petrous junction. 3. Severe atheromatous plaque throughout the petrous, cavernous, and supraclinoid ICAs bilaterally with associated moderate to severe multifocal stenoses. 4. Scattered atheromatous plaque within the left CCA with up to 40-50% stenosis. 5. Approximate 50% stenosis at the origin of the right vertebral artery, with additional 60-70% stenoses involving the bilateral V2 segments downstream as above. 6. 65% atheromatous stenosis at the origin of the left subclavian artery. 7. Faint linear lucency extending through the C2 vertebral body, favored to reflect a benign nutrient foramen. If there is a history of trauma or high clinical suspicion for possible cervical spine injury, then further assessment with dedicated cervical spine CT would be recommended.   Will d/w endovascular for angio and possible revascularization options in AM. Loaded with ASA and PLVX. Stroke team to follow.  -- Amie Portland, MD [facility] Pager: [phone] If 7pm to 7am, please call on call as listed on AMION.  ADDENDUM D/W Dr. Estanislado Pandy. Appropriate for diagnostic cerebral angiogram. Order placed.  -- Amie Portland, MD [facility] Pager: [phone] If 7pm to 7am, please call on call as listed on AMION.]

## 2018-08-01 NOTE — Progress Notes (Addendum)
SLP Cancellation Note  Patient Details Name: [name] MRN: [id number] DOB: 05-07-1960   Cancelled treatment:       Reason Eval/Treat Not Completed: Patient at procedure or test/unavailable(Pt working with OT at this time. SLP will follow up. )  Erial Fikes I. Hardin Negus, [place], [place] Office number [phone] Pager [place] 08/01/2018, 8:50 AM

## 2018-08-01 NOTE — Plan of Care (Signed)
Progressing towards goals

## 2018-08-01 NOTE — Progress Notes (Signed)
2D Echocardiogram has been performed.  [name] 08/01/2018, 12:04 PM

## 2018-08-01 NOTE — Consult Note (Signed)
Chief Complaint: Patient was seen in consultation today for bilateral pontine infarcts  Referring Physician(s): Dr. Rory Percy  Supervising Physician: Luanne Bras  Patient Status: [facility] - In-pt  History of Present Illness: [name] is a 58 y.o. male who presented to [facility] with weakness in his legs, difficulty walking, dizziness, and blurry vision.  The symptoms started the afternoon of 07/30/2018 however patient did not present to hospital for evaluation until the following day.  He was found to have acute infarction affecting the pons, more extensive on the right and the left.  In addition patient also had occlusion of both vertebral artery V4 segments and the basilar artery.  He was outside window for IV TPA.  He has since had improvement in his symptoms.  Neuro interventional service now consulted for diagnostic angiogram at the request of Dr. Rory Percy.  Patient assessed at bedside and found to have no focal deficits this morning.  He is tearful about his diagnosis.  He would be interested in further work-up. Case reviewed by Dr. Bronson Curb who approves patient for procedure.  Past Medical History:  Diagnosis Date   Anxiety    Arthritis    Bradycardia    a. During 11/2012 adm: lopressor decreased.   Chronic lower back pain    "I work Architect; messed back up ~ 30 yr ago; paralyzed for 2 days then" (11/05/2012)   Coronary artery disease    a. Diag cath 10/2012 for CP/abnormal nuc -> planned PCI s/p LAD atherectomy/DES placement 11/05/12.   GERD (gastroesophageal reflux disease)    Hypercholesteremia    Hypertension     Past Surgical History:  Procedure Laterality Date   CARDIAC CATHETERIZATION  10/29/2012   CORONARY ANGIOPLASTY WITH STENT PLACEMENT  11/05/2012   HEMORRHOID SURGERY  ~ 2010   INSERTION OF MESH N/A 06/02/2015   Procedure: INSERTION OF MESH;  Surgeon: [name], MD;  Location: [facility];  Service: General;  Laterality: N/A;   LEFT HEART  CATHETERIZATION WITH CORONARY ANGIOGRAM N/A 10/30/2012   Procedure: LEFT HEART CATHETERIZATION WITH CORONARY ANGIOGRAM;  Surgeon: [name], MD;  Location: [place];  Service: Cardiovascular;  Laterality: N/A;   PERCUTANEOUS CORONARY ROTOBLATOR INTERVENTION (PCI-R) N/A 11/05/2012   Procedure: PERCUTANEOUS CORONARY ROTOBLATOR INTERVENTION (PCI-R);  Surgeon: [name], MD;  Location: [facility] C[facility];  Service: Cardiovascular;  Laterality: N/A;   UMBILICAL HERNIA REPAIR N/A 06/02/2015   Procedure: UMBILICAL HERNIORRHAPHY WITH MESH;  Surgeon: [name], MD;  Location: [facility];  Service: General;  Laterality: N/A;    Allergies: Prednisone  Medications: Prior to Admission medications   Medication Sig Start Date End Date Taking? Authorizing Provider  aspirin 81 MG tablet Take 1 tablet (81 mg total) by mouth daily. 11/06/12  Yes Dunn, Dayna N, PA-C  atorvastatin (LIPITOR) 80 MG tablet Take 1 tablet (80 mg total) by mouth every evening. 10/23/17  Yes Herminio Commons, MD  HYDROcodone-acetaminophen (NORCO) 10-325 MG tablet Take 1 tablet by mouth every 4 (four) hours as needed. 06/02/15  Yes [name], MD  lisinopril (PRINIVIL,ZESTRIL) 20 MG tablet Take 20 mg by mouth 2 (two) times daily.   Yes [provider]  metoprolol tartrate (LOPRESSOR) 25 MG tablet TAKE 1/2 TABLET BY MOUTH TWICE DAILY 01/25/15  Yes Herminio Commons, MD  nitroGLYCERIN (NITROSTAT) 0.4 MG SL tablet Place 1 tablet (0.4 mg total) under the tongue every 5 (five) minutes as needed for chest pain (CP or SOB). 07/27/14  Yes Herminio Commons,  MD  omeprazole (PRILOSEC) 20 MG capsule Take 20 mg by mouth daily.  07/22/18  Yes [provider]     Family History  Problem Relation Age of Onset   Heart disease Father    Heart disease Sister     Social History   Socioeconomic History   Marital status: Married    Spouse name: Not on file   Number of children: Not on file   Years of  education: Not on file   Highest education level: Not on file  Occupational History   Not on file  Social Needs   Financial resource strain: Not on file   Food insecurity    Worry: Not on file    Inability: Not on file   Transportation needs    Medical: Not on file    Non-medical: Not on file  Tobacco Use   Smoking status: Former Smoker    Packs/day: 3.00    Years: 32.00    Pack years: 96.00    Types: Cigarettes    Start date: 08/29/1977    Quit date: 11/29/2006    Years since quitting: 11.6   Smokeless tobacco: Former Systems developer    Types: Chew   Tobacco comment: 11/05/2012 "chewed tobacco when I play ball; aien't chewed since age 32"  Substance and Sexual Activity   Alcohol use: Yes    Alcohol/week: 0.0 standard drinks    Comment: "couple of beers daily"   Drug use: No   Sexual activity: Not Currently  Lifestyle   Physical activity    Days per week: Not on file    Minutes per session: Not on file   Stress: Not on file  Relationships   Social connections    Talks on phone: Not on file    Gets together: Not on file    Attends religious service: Not on file    Active member of club or organization: Not on file    Attends meetings of clubs or organizations: Not on file    Relationship status: Not on file  Other Topics Concern   Not on file  Social History Narrative   Not on file     Review of Systems: A 12 point ROS discussed and pertinent positives are indicated in the HPI above.  All other systems are negative.  Review of Systems  Constitutional: Negative for fatigue and fever.  Respiratory: Negative for cough and shortness of breath.   Cardiovascular: Negative for chest pain.  Gastrointestinal: Negative for abdominal pain.  Musculoskeletal: Negative for back pain.  Psychiatric/Behavioral: Negative for behavioral problems and confusion.    Vital Signs: BP (!) 215/116 (BP Location: Left Arm)    Pulse 64    Temp (!) 97.3 F (36.3 C) (Oral)    Resp  18    Ht 6' (1.829 m)    Wt 160 lb 0.9 oz (72.6 kg)    SpO2 99%    BMI 21.71 kg/m   Physical Exam Vitals signs and nursing note reviewed.  Constitutional:      Appearance: Normal appearance.  HENT:     Mouth/Throat:     Mouth: Mucous membranes are moist.     Pharynx: Oropharynx is clear.  Cardiovascular:     Rate and Rhythm: Normal rate and regular rhythm.     Heart sounds: No murmur. No friction rub. No gallop.   Pulmonary:     Effort: Pulmonary effort is normal.     Breath sounds: Normal breath sounds.  Abdominal:  General: Abdomen is flat.     Palpations: Abdomen is soft.  Skin:    General: Skin is warm and dry.  Neurological:     General: No focal deficit present.     Mental Status: He is alert and oriented to person, place, and time. Mental status is at baseline.  Psychiatric:        Mood and Affect: Mood normal.        Behavior: Behavior normal.        Thought Content: Thought content normal.        Judgment: Judgment normal.      MD Evaluation Airway: WNL Heart: WNL Abdomen: WNL Chest/ Lungs: WNL ASA  Classification: 3 Mallampati/Airway Score: One   Imaging: Ct Angio Head W Or Wo Contrast  Result Date: 08/01/2018 CLINICAL DATA:  Follow-up examination for acute stroke. EXAM: CT ANGIOGRAPHY HEAD AND NECK TECHNIQUE: Multidetector CT imaging of the head and neck was performed using the standard protocol during bolus administration of intravenous contrast. Multiplanar CT image reconstructions and MIPs were obtained to evaluate the vascular anatomy. Carotid stenosis measurements (when applicable) are obtained utilizing NASCET criteria, using the distal internal carotid diameter as the denominator. CONTRAST:  47mL OMNIPAQUE IOHEXOL 350 MG/ML SOLN COMPARISON:  Prior MRI and MRA from 07/31/2018 FINDINGS: CTA NECK FINDINGS Aortic arch: Visualized aortic arch of normal caliber with normal 3 vessel morphology. Scattered atheromatous plaque about the arch and origin of the  great vessels. Resultant short-segment stenosis of up to approximately 65% at the origin of the left subclavian artery. Short-segment approximate 40% stenosis at the origin of the right subclavian artery. Visualized subclavian arteries otherwise widely patent. No other hemodynamically significant stenosis about the origin of the great vessels. Right carotid system: Right CCA mildly tortuous proximally. Scattered atheromatous plaque within the right common carotid artery without hemodynamically significant stenosis. Bulky calcified plaque about the right bifurcation/proximal right ICA. Associated stenosis of up to 70% by NASCET criteria at the proximal right ICA. Area of involvement begins at the bifurcation and measures approximately 2.3 cm in length. Distally, right ICA patent to the skull base without additional stenosis, dissection, or occlusion. Focal tortuosity noted within the distal cervical right ICA just prior to the skull base. Left carotid system: Scattered atheromatous plaque seen throughout the left CCA with up to approximately 40-50% stenosis. Left CCA remains patent to the bifurcation. Multifocal atheromatous plaque about the left bifurcation/proximal left ICA r with resultant stenosis of up to 65% by NASCET criteria. Left ICA patent distally to the skull base without dissection or occlusion. Additional calcified atheromatous plaque about the distal left ICA at the level of the skull base with up to approximately 60% stenosis by NASCET criteria (series 7, image 140). Vertebral arteries: Both vertebral arteries arise from the subclavian arteries. Vertebral arteries largely co dominant. On the left, focal plaque seen within the left V2 segment at the level of C5 with associated moderate to severe stenosis of up to approximately 60-70% (series 7, image 239). Left vertebral artery otherwise widely patent to the skull base. On the right, focal plaque at the origin of the right vertebral artery with  associated stenosis of up to approximately 50%. Distally, there is focal plaque involving the distal right V2 segment at the level of C3 with associated short-segment moderate to severe stenosis of up to approximately 70% (series 7, image 210). Right vertebral artery otherwise patent within the neck. Attenuated flow seen within the distal vertebral arteries bilaterally related to downstream severe  bilateral V4 stenoses. Skeleton: Subtle transverse linear lucency seen extending through the C2 vertebral body, favored to reflect a nutrient foramen (series 9, image 102). No associated prevertebral edema. No other acute osseous abnormality. No discrete lytic or blastic osseous lesions. Mild cervical spondylolysis at C5-6 and C6-7. Other neck: No other acute soft tissue abnormality within the neck. Upper chest: Visualized upper chest demonstrates no acute finding. Upper lobe predominant centrilobular and paraseptal emphysema. Review of the MIP images confirms the above findings CTA HEAD FINDINGS Anterior circulation: Extensive atheromatous plaque throughout the petrous, cavernous, and supraclinoid segments bilaterally with associated moderate to severe multifocal stenoses. ICAs are patent to the termini. A1 segments patent bilaterally. Duplicated versus fenestrated anterior communicating artery. Anterior cerebral arteries patent to their distal aspects without hemodynamically significant stenosis. No M1 stenosis or occlusion. Normal MCA bifurcations. Distal MCA branches well perfused and symmetric. Posterior circulation: Vertebral arteries patent as they cross into the cranial vault. Bulky calcified plaque seen involving the proximal left V4 segment with secondary severe occlusive/pre occlusive stenosis (series 7, image 152). Faint markedly attenuated flow seen distally, with essentially no flow within the distal left V4 segment as it reaches the vertebrobasilar junction. Left PICA is perfused and patent. On the right,  additional focal atheromatous plaque with severe stenosis within the proximal right V4 segment just prior to the takeoff of the right PICA (series 7, image 144). Markedly attenuated flow within the right V4 segment distally, with a centrally absent flow by the time of the vertebrobasilar junction. Right PICA is grossly perfused proximally. No significant flow seen within the proximal and mid basilar artery. Distal reconstitution at the basilar tip, likely via collateralization from small bilateral posterior communicating arteries. Some back flow into the basilar artery proximally with perfusion of the superior cerebral arteries bilaterally. PCAs remain patent and are well perfused to their distal aspects. Venous sinuses: Grossly patent, although not well assessed due to timing of the contrast bolus. Anatomic variants: None significant. Review of the MIP images confirms the above findings IMPRESSION: 1. 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 Basilar tip is perfused via small bilateral posterior communicating arteries, with perfusion of the bilateral PCAs and SCAs. 2. Atheromatous stenoses about the carotid bifurcations/proximal ICAs bilaterally, with up to 70% narrowing on the right and 65% narrowing on the left. Additional approximate 60% stenosis involving the distal left ICA at the cervical-petrous junction. 3. Severe atheromatous plaque throughout the petrous, cavernous, and supraclinoid ICAs bilaterally with associated moderate to severe multifocal stenoses. 4. Scattered atheromatous plaque within the left CCA with up to 40-50% stenosis. 5. Approximate 50% stenosis at the origin of the right vertebral artery, with additional 60-70% stenoses involving the bilateral V2 segments downstream as above. 6. 65% atheromatous stenosis at the origin of the left subclavian artery. 7. Faint linear lucency extending through the C2 vertebral body, favored to  reflect a benign nutrient foramen. If there is a history of trauma or high clinical suspicion for possible cervical spine injury, then further assessment with dedicated cervical spine CT would be recommended. Results were discussed by telephone at the time of interpretation on 08/01/2018 at 1:29 am with Dr. Amie Portland. Electronically Signed   By: Jeannine Boga M.D.   On: 08/01/2018 02:38   Dg Chest 2 View  Result Date: 07/31/2018 CLINICAL DATA:  Weakness and dizziness. EXAM: CHEST - 2 VIEW COMPARISON:  September 03, 2012. FINDINGS: There is no appreciable edema or consolidation. Heart  size and pulmonary vascularity are normal. No adenopathy. No bone lesions. IMPRESSION: No edema or consolidation.  Stable cardiac silhouette. Electronically Signed   By: Lowella Grip III M.D.   On: 07/31/2018 12:32   Ct Head Wo Contrast  Result Date: 07/31/2018 CLINICAL DATA:  Altered level of consciousness, history hypertension, coronary artery disease EXAM: CT HEAD WITHOUT CONTRAST TECHNIQUE: Contiguous axial images were obtained from the base of the skull through the vertex without intravenous contrast. Sagittal and coronal MPR images reconstructed from axial data set. COMPARISON:  None FINDINGS: Brain: Generalized atrophy. Normal ventricular morphology. No midline shift or mass effect. Small vessel chronic ischemic changes of deep cerebral white matter. No intracranial hemorrhage, mass lesion, evidence of acute infarction, or extra-axial fluid collection. Vascular: Atherosclerotic calcification of internal carotid and vertebral arteries at skull base Skull: Intact Sinuses/Orbits: Clear Other: N/A IMPRESSION: Atrophy with small vessel chronic ischemic changes of deep cerebral white matter. No acute intracranial abnormalities. Extensive atherosclerotic calcifications at the skull base particularly of the internal carotid arteries. Electronically Signed   By: Lavonia Dana M.D.   On: 07/31/2018 12:26   Ct Angio  Neck W Or Wo Contrast  Result Date: 08/01/2018 CLINICAL DATA:  Follow-up examination for acute stroke. EXAM: CT ANGIOGRAPHY HEAD AND NECK TECHNIQUE: Multidetector CT imaging of the head and neck was performed using the standard protocol during bolus administration of intravenous contrast. Multiplanar CT image reconstructions and MIPs were obtained to evaluate the vascular anatomy. Carotid stenosis measurements (when applicable) are obtained utilizing NASCET criteria, using the distal internal carotid diameter as the denominator. CONTRAST:  77mL OMNIPAQUE IOHEXOL 350 MG/ML SOLN COMPARISON:  Prior MRI and MRA from 07/31/2018 FINDINGS: CTA NECK FINDINGS Aortic arch: Visualized aortic arch of normal caliber with normal 3 vessel morphology. Scattered atheromatous plaque about the arch and origin of the great vessels. Resultant short-segment stenosis of up to approximately 65% at the origin of the left subclavian artery. Short-segment approximate 40% stenosis at the origin of the right subclavian artery. Visualized subclavian arteries otherwise widely patent. No other hemodynamically significant stenosis about the origin of the great vessels. Right carotid system: Right CCA mildly tortuous proximally. Scattered atheromatous plaque within the right common carotid artery without hemodynamically significant stenosis. Bulky calcified plaque about the right bifurcation/proximal right ICA. Associated stenosis of up to 70% by NASCET criteria at the proximal right ICA. Area of involvement begins at the bifurcation and measures approximately 2.3 cm in length. Distally, right ICA patent to the skull base without additional stenosis, dissection, or occlusion. Focal tortuosity noted within the distal cervical right ICA just prior to the skull base. Left carotid system: Scattered atheromatous plaque seen throughout the left CCA with up to approximately 40-50% stenosis. Left CCA remains patent to the bifurcation. Multifocal  atheromatous plaque about the left bifurcation/proximal left ICA r with resultant stenosis of up to 65% by NASCET criteria. Left ICA patent distally to the skull base without dissection or occlusion. Additional calcified atheromatous plaque about the distal left ICA at the level of the skull base with up to approximately 60% stenosis by NASCET criteria (series 7, image 140). Vertebral arteries: Both vertebral arteries arise from the subclavian arteries. Vertebral arteries largely co dominant. On the left, focal plaque seen within the left V2 segment at the level of C5 with associated moderate to severe stenosis of up to approximately 60-70% (series 7, image 239). Left vertebral artery otherwise widely patent to the skull base. On the right, focal plaque at the origin  of the right vertebral artery with associated stenosis of up to approximately 50%. Distally, there is focal plaque involving the distal right V2 segment at the level of C3 with associated short-segment moderate to severe stenosis of up to approximately 70% (series 7, image 210). Right vertebral artery otherwise patent within the neck. Attenuated flow seen within the distal vertebral arteries bilaterally related to downstream severe bilateral V4 stenoses. Skeleton: Subtle transverse linear lucency seen extending through the C2 vertebral body, favored to reflect a nutrient foramen (series 9, image 102). No associated prevertebral edema. No other acute osseous abnormality. No discrete lytic or blastic osseous lesions. Mild cervical spondylolysis at C5-6 and C6-7. Other neck: No other acute soft tissue abnormality within the neck. Upper chest: Visualized upper chest demonstrates no acute finding. Upper lobe predominant centrilobular and paraseptal emphysema. Review of the MIP images confirms the above findings CTA HEAD FINDINGS Anterior circulation: Extensive atheromatous plaque throughout the petrous, cavernous, and supraclinoid segments bilaterally with  associated moderate to severe multifocal stenoses. ICAs are patent to the termini. A1 segments patent bilaterally. Duplicated versus fenestrated anterior communicating artery. Anterior cerebral arteries patent to their distal aspects without hemodynamically significant stenosis. No M1 stenosis or occlusion. Normal MCA bifurcations. Distal MCA branches well perfused and symmetric. Posterior circulation: Vertebral arteries patent as they cross into the cranial vault. Bulky calcified plaque seen involving the proximal left V4 segment with secondary severe occlusive/pre occlusive stenosis (series 7, image 152). Faint markedly attenuated flow seen distally, with essentially no flow within the distal left V4 segment as it reaches the vertebrobasilar junction. Left PICA is perfused and patent. On the right, additional focal atheromatous plaque with severe stenosis within the proximal right V4 segment just prior to the takeoff of the right PICA (series 7, image 144). Markedly attenuated flow within the right V4 segment distally, with a centrally absent flow by the time of the vertebrobasilar junction. Right PICA is grossly perfused proximally. No significant flow seen within the proximal and mid basilar artery. Distal reconstitution at the basilar tip, likely via collateralization from small bilateral posterior communicating arteries. Some back flow into the basilar artery proximally with perfusion of the superior cerebral arteries bilaterally. PCAs remain patent and are well perfused to their distal aspects. Venous sinuses: Grossly patent, although not well assessed due to timing of the contrast bolus. Anatomic variants: None significant. Review of the MIP images confirms the above findings IMPRESSION: 1. 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 Basilar tip is perfused via small bilateral posterior communicating arteries, with perfusion of the bilateral PCAs  and SCAs. 2. Atheromatous stenoses about the carotid bifurcations/proximal ICAs bilaterally, with up to 70% narrowing on the right and 65% narrowing on the left. Additional approximate 60% stenosis involving the distal left ICA at the cervical-petrous junction. 3. Severe atheromatous plaque throughout the petrous, cavernous, and supraclinoid ICAs bilaterally with associated moderate to severe multifocal stenoses. 4. Scattered atheromatous plaque within the left CCA with up to 40-50% stenosis. 5. Approximate 50% stenosis at the origin of the right vertebral artery, with additional 60-70% stenoses involving the bilateral V2 segments downstream as above. 6. 65% atheromatous stenosis at the origin of the left subclavian artery. 7. Faint linear lucency extending through the C2 vertebral body, favored to reflect a benign nutrient foramen. If there is a history of trauma or high clinical suspicion for possible cervical spine injury, then further assessment with dedicated cervical spine CT would be recommended. Results were  discussed by telephone at the time of interpretation on 08/01/2018 at 1:29 am with Dr. Amie Portland. Electronically Signed   By: Jeannine Boga M.D.   On: 08/01/2018 02:38   Mr Angio Head Wo Contrast  Result Date: 07/31/2018 CLINICAL DATA:  Headache, dizziness and gait disturbance. Blurred vision. Left-sided weakness. Symptoms began yesterday. EXAM: MRI HEAD WITHOUT CONTRAST MRA HEAD WITHOUT CONTRAST TECHNIQUE: Multiplanar, multiecho pulse sequences of the brain and surrounding structures were obtained without intravenous contrast. Angiographic images of the head were obtained using MRA technique without contrast. COMPARISON:  CT 07/31/2018 FINDINGS: MRI HEAD FINDINGS Brain: Diffusion imaging shows multiple foci of acute infarction affecting the pons, more to the right of midline than the left. No other acute infarction. Elsewhere, there is no cerebellar insult. Cerebral hemispheres show mild  chronic small-vessel change of the deep and subcortical white matter. No large vessel territory infarction. No mass lesion, hemorrhage, hydrocephalus or extra-axial collection. Vascular: Major vessels at the base of the brain show flow. Skull and upper cervical spine: Negative Sinuses/Orbits: Clear/normal Other: None MRA HEAD FINDINGS Both internal carotid arteries are patent through the skull base and siphon regions. No siphon stenosis. The anterior and middle cerebral vessels are patent without proximal stenosis, aneurysm or vascular malformation. Both vertebral arteries show flow at the foramen magnum level and give supply to both posteroinferior cerebellar arteries. No flow is seen distal to that in the V4 segments reaching the basilar. No flow is seen in the basilar artery until the basilar tip, where there is reconstituted flow supplying both superior cerebellar and posterior cerebral arteries. Patent posterior communicating arteries are responsible for this supply. 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 Both posteroinferior cerebellar arteries receive flow. Patent posterior communicating arteries on each side reconstitute the basilar tip and give supply to the superior cerebellar and posterior cerebral arteries. Electronically Signed   By: Nelson Chimes M.D.   On: 07/31/2018 14:45   Mr Brain Wo Contrast  Result Date: 07/31/2018 CLINICAL DATA:  Headache, dizziness and gait disturbance. Blurred vision. Left-sided weakness. Symptoms began yesterday. EXAM: MRI HEAD WITHOUT CONTRAST MRA HEAD WITHOUT CONTRAST TECHNIQUE: Multiplanar, multiecho pulse sequences of the brain and surrounding structures were obtained without intravenous contrast. Angiographic images of the head were obtained using MRA technique  without contrast. COMPARISON:  CT 07/31/2018 FINDINGS: MRI HEAD FINDINGS Brain: Diffusion imaging shows multiple foci of acute infarction affecting the pons, more to the right of midline than the left. No other acute infarction. Elsewhere, there is no cerebellar insult. Cerebral hemispheres show mild chronic small-vessel change of the deep and subcortical white matter. No large vessel territory infarction. No mass lesion, hemorrhage, hydrocephalus or extra-axial collection. Vascular: Major vessels at the base of the brain show flow. Skull and upper cervical spine: Negative Sinuses/Orbits: Clear/normal Other: None MRA HEAD FINDINGS Both internal carotid arteries are patent through the skull base and siphon regions. No siphon stenosis. The anterior and middle cerebral vessels are patent without proximal stenosis, aneurysm or vascular malformation. Both vertebral arteries show flow at the foramen magnum level and give supply to both posteroinferior cerebellar arteries. No flow is seen distal to that in the V4 segments reaching the basilar. No flow is seen in the basilar artery until the basilar tip, where there is reconstituted flow supplying both superior cerebellar and posterior  cerebral arteries. Patent posterior communicating arteries are responsible for this supply. 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 Both posteroinferior cerebellar arteries receive flow. Patent posterior communicating arteries on each side reconstitute the basilar tip and give supply to the superior cerebellar and posterior cerebral arteries. Electronically Signed   By: Nelson Chimes M.D.   On: 07/31/2018 14:45    Labs:  CBC: Recent Labs    07/31/18 1104 07/31/18 2211  WBC 8.4 9.1  HGB 15.0 15.1  HCT 44.3 43.0  PLT 262 261      COAGS: No results for input(s): INR, APTT in the last 8760 hours.  BMP: Recent Labs    07/31/18 1104 07/31/18 2211  NA 129*  --   K 3.8  --   CL 95*  --   CO2 23  --   GLUCOSE 126*  --   BUN 13  --   CALCIUM 9.7  --   CREATININE 0.83 1.01  GFRNONAA >60 >60  GFRAA >60 >60    LIVER FUNCTION TESTS: Recent Labs    07/31/18 1104  BILITOT 1.0  AST 19  ALT 12  ALKPHOS 44  PROT 8.4*  ALBUMIN 4.8    TUMOR MARKERS: No results for input(s): AFPTM, CEA, CA199, CHROMGRNA in the last 8760 hours.  Assessment and Plan: Acute infarct of the pons Patient s/p CVA yesterday. Transferred from [facility] for further evaluation.  NIR consulted for inpatient diagnostic angiogram.  Case reviewed by Dr. Estanislado Pandy who approved patient for procedure. Likely tomorrow. Patient may eat and drink today.  Will place orders for possible procedure tomorrow.  Currently on aspirin and Plavix.  Risks and benefits were discussed with the patient including, but not limited to bleeding, infection, vascular injury or contrast induced renal failure.  This interventional procedure involves the use of X-rays and because of the nature of the planned procedure, it is possible that we will have prolonged use of X-ray fluoroscopy.  Potential radiation risks to you include (but are not limited to) the following: - A slightly elevated risk for cancer  several years later in life. This risk is typically less than 0.5% percent. This risk is low in comparison to the normal incidence of human cancer, which is 33% for women and 50% for men according to the [employer]. - Radiation induced injury can include skin redness, resembling a rash, tissue breakdown / ulcers and hair loss (which can be temporary or permanent).   The likelihood of either of these occurring depends on the difficulty of the procedure and whether you are sensitive to radiation due to previous procedures, disease, or genetic conditions.    IF your procedure requires a prolonged use of radiation, you will be notified and given written instructions for further action.  It is your responsibility to monitor the irradiated area for the 2 weeks following the procedure and to notify your physician if you are concerned that you have suffered a radiation induced injury.    All of the patient's questions were answered, patient is agreeable to proceed.  Consent signed and in chart.  Thank you for this interesting consult.  I greatly enjoyed meeting DAYMIAN LILL and look forward to participating in their care.  A copy of this report was sent to the requesting provider on this date.  Electronically  Signed: Docia Barrier, PA 08/01/2018, 11:32 AM   I spent a total of 40 Minutes    in face to face in clinical consultation, greater than 50% of which was counseling/coordinating care for CVA.

## 2018-08-01 NOTE — Progress Notes (Signed)
PT Cancellation/Discharge Note  Patient Details Name: [name] MRN: [id number] DOB: [birth year]   Cancelled Treatment:    Reason Eval/Treat Not Completed: PT screened, no needs identified, will sign off.  Pt was independent with gait and mobility with PT and scored perfectly on the DGI walking balance test.  PT is not needed at this time and will sign off.   Thanks,  Barbarann Ehlers. Hilmer Aliberti, PT, DPT  Acute Rehabilitation [phone] pager [phone] office  @ [facility]: [phone]     Harvie Heck 08/01/2018, 5:30 PM

## 2018-08-01 NOTE — Progress Notes (Signed)
Progress Note    [name]  TZG:[id number] DOB: [birth year]  DOA: 07/31/2018 PCP: [name], MD    Brief Narrative:     Medical records reviewed and are as summarized below:  [name] is an 58 y.o. male with past medical history significant for HTN, HL, daily alcohol use and previous CAD was in his usual state of health until yesterday when he noted sudden onset difficulty walking.  Patient states he had gotten into his car without difficulty however when he tried to get out of his car to go to [place] he had difficulty walking.  "I could not get my legs to do what they needed to do".    Assessment/Plan:   Principal Problem:   Stroke [facility]) Active Problems:   Hypertension   Alcohol abuse, daily use   Hyperlipidemia   Coronary artery disease  ACUTE STROKE-- infarction of the pons -ASA/plavix -diagnostic cerebral angiogram in AM  HYPONATREMIA  Very possibly secondary to some beer potomania -BMP in AM  HTN Allow for permissive hypertension- preferably keep blood pressures over 140 and do not treat unless they go over 220.  ALCOHOL ABUSE -CIWA-- scheduled and PRN  HL Continue atorvastatin    Family Communication/Anticipated D/C date and plan/Code Status   DVT prophylaxis: Lovenox ordered. Code Status: Full Code.  Family Communication: Disposition Plan: plan for angiogram in the AM   Medical Consultants:    Neuro  IR   Subjective:   No complaints-- having angiogram in the AM  Objective:    Vitals:   07/31/18 2315 08/01/18 0039 08/01/18 0343 08/01/18 0752  BP: (!) 182/93 134/77 (!) 168/109 (!) 215/116  Pulse: 75 71 70 64  Resp: 18 18 18 18   Temp: 97.9 F (36.6 C) 97.8 F (36.6 C) 97.8 F (36.6 C) (!) 97.3 F (36.3 C)  TempSrc: Oral Oral Oral Oral  SpO2: 100% 98% 97% 99%  Weight:      Height:        Intake/Output Summary (Last 24 hours) at 08/01/2018 1218 Last data filed at 08/01/2018 0100 Gross per 24 hour  Intake 1016.58  ml  Output 250 ml  Net 766.58 ml   Filed Weights   07/31/18 1002 07/31/18 2139  Weight: 75.8 kg 72.6 kg    Exam: In bed, NAD rrr Mild tremor Facial flushing No LE edema  Data Reviewed:   I have personally reviewed following labs and imaging studies:  Labs: Labs show the following:   Basic Metabolic Panel: Recent Labs  Lab 07/31/18 1104 07/31/18 2211  NA 129*  --   K 3.8  --   CL 95*  --   CO2 23  --   GLUCOSE 126*  --   BUN 13  --   CREATININE 0.83 1.01  CALCIUM 9.7  --    GFR Estimated Creatinine Clearance: 83.9 mL/min (by C-G formula based on SCr of 1.01 mg/dL). Liver Function Tests: Recent Labs  Lab 07/31/18 1104  AST 19  ALT 12  ALKPHOS 44  BILITOT 1.0  PROT 8.4*  ALBUMIN 4.8   No results for input(s): LIPASE, AMYLASE in the last 168 hours. No results for input(s): AMMONIA in the last 168 hours. Coagulation profile No results for input(s): INR, PROTIME in the last 168 hours.  CBC: Recent Labs  Lab 07/31/18 1104 07/31/18 2211  WBC 8.4 9.1  NEUTROABS 6.5  --   HGB 15.0 15.1  HCT 44.3 43.0  MCV 95.7 93.3  PLT  262 261   Cardiac Enzymes: No results for input(s): CKTOTAL, CKMB, CKMBINDEX, TROPONINI in the last 168 hours. BNP (last 3 results) No results for input(s): PROBNP in the last 8760 hours. CBG: No results for input(s): GLUCAP in the last 168 hours. D-Dimer: No results for input(s): DDIMER in the last 72 hours. Hgb A1c: Recent Labs    08/01/18 0351  HGBA1C 5.4   Lipid Profile: Recent Labs    08/01/18 0351  CHOL 194  HDL 73  LDLCALC 101*  TRIG 98  CHOLHDL 2.7   Thyroid function studies: No results for input(s): TSH, T4TOTAL, T3FREE, THYROIDAB in the last 72 hours.  Invalid input(s): FREET3 Anemia work up: No results for input(s): VITAMINB12, FOLATE, FERRITIN, TIBC, IRON, RETICCTPCT in the last 72 hours. Sepsis Labs: Recent Labs  Lab 07/31/18 1104 07/31/18 2211  WBC 8.4 9.1    Microbiology Recent Results (from  the past 240 hour(s))  SARS Coronavirus 2 (CEPHEID - Performed in [place] hospital lab), Hosp Order     Status: None   Collection Time: 07/31/18 11:45 AM   Specimen: Nasopharyngeal Swab  Result Value Ref Range Status   SARS Coronavirus 2 NEGATIVE NEGATIVE Final    Comment: (NOTE) If result is NEGATIVE SARS-CoV-2 target nucleic acids are NOT DETECTED. The SARS-CoV-2 RNA is generally detectable in upper and lower  respiratory specimens during the acute phase of infection. The lowest  concentration of SARS-CoV-2 viral copies this assay can detect is 250  copies / mL. A negative result does not preclude SARS-CoV-2 infection  and should not be used as the sole basis for treatment or other  patient management decisions.  A negative result may occur with  improper specimen collection / handling, submission of specimen other  than nasopharyngeal swab, presence of viral mutation(s) within the  areas targeted by this assay, and inadequate number of viral copies  (<250 copies / mL). A negative result must be combined with clinical  observations, patient history, and epidemiological information. If result is POSITIVE SARS-CoV-2 target nucleic acids are DETECTED. The SARS-CoV-2 RNA is generally detectable in upper and lower  respiratory specimens dur ing the acute phase of infection.  Positive  results are indicative of active infection with SARS-CoV-2.  Clinical  correlation with patient history and other diagnostic information is  necessary to determine patient infection status.  Positive results do  not rule out bacterial infection or co-infection with other viruses. If result is PRESUMPTIVE POSTIVE SARS-CoV-2 nucleic acids MAY BE PRESENT.   A presumptive positive result was obtained on the submitted specimen  and confirmed on repeat testing.  While 2019 novel coronavirus  (SARS-CoV-2) nucleic acids may be present in the submitted sample  additional confirmatory testing may be necessary  for epidemiological  and / or clinical management purposes  to differentiate between  SARS-CoV-2 and other Sarbecovirus currently known to infect humans.  If clinically indicated additional testing with an alternate test  methodology [phone]) is advised. The SARS-CoV-2 RNA is generally  detectable in upper and lower respiratory sp ecimens during the acute  phase of infection. The expected result is Negative. Fact Sheet for Patients:  StrictlyIdeas.no Fact Sheet for Healthcare Providers: BankingDealers.co.za This test is not yet approved or cleared by the [place] FDA and has been authorized for detection and/or diagnosis of SARS-CoV-2 by FDA under an Emergency Use Authorization (EUA).  This EUA will remain in effect (meaning this test can be used) for the duration of the COVID-19 declaration under Section  564(b)(1) of the Act, 21 U.S.C. section 360bbb-3(b)(1), unless the authorization is terminated or revoked sooner. Performed at [facility], [address] [place], [place] [address]     Procedures and diagnostic studies:  Ct Angio Head W Or Wo Contrast  Result Date: 08/01/2018 CLINICAL DATA:  Follow-up examination for acute stroke. EXAM: CT ANGIOGRAPHY HEAD AND NECK TECHNIQUE: Multidetector CT imaging of the head and neck was performed using the standard protocol during bolus administration of intravenous contrast. Multiplanar CT image reconstructions and MIPs were obtained to evaluate the vascular anatomy. Carotid stenosis measurements (when applicable) are obtained utilizing NASCET criteria, using the distal internal carotid diameter as the denominator. CONTRAST:  16mL OMNIPAQUE IOHEXOL 350 MG/ML SOLN COMPARISON:  Prior MRI and MRA from 07/31/2018 FINDINGS: CTA NECK FINDINGS Aortic arch: Visualized aortic arch of normal caliber with normal 3 vessel morphology. Scattered atheromatous plaque about the arch and origin of the great  vessels. Resultant short-segment stenosis of up to approximately 65% at the origin of the left subclavian artery. Short-segment approximate 40% stenosis at the origin of the right subclavian artery. Visualized subclavian arteries otherwise widely patent. No other hemodynamically significant stenosis about the origin of the great vessels. Right carotid system: Right CCA mildly tortuous proximally. Scattered atheromatous plaque within the right common carotid artery without hemodynamically significant stenosis. Bulky calcified plaque about the right bifurcation/proximal right ICA. Associated stenosis of up to 70% by NASCET criteria at the proximal right ICA. Area of involvement begins at the bifurcation and measures approximately 2.3 cm in length. Distally, right ICA patent to the skull base without additional stenosis, dissection, or occlusion. Focal tortuosity noted within the distal cervical right ICA just prior to the skull base. Left carotid system: Scattered atheromatous plaque seen throughout the left CCA with up to approximately 40-50% stenosis. Left CCA remains patent to the bifurcation. Multifocal atheromatous plaque about the left bifurcation/proximal left ICA r with resultant stenosis of up to 65% by NASCET criteria. Left ICA patent distally to the skull base without dissection or occlusion. Additional calcified atheromatous plaque about the distal left ICA at the level of the skull base with up to approximately 60% stenosis by NASCET criteria (series 7, image 140). Vertebral arteries: Both vertebral arteries arise from the subclavian arteries. Vertebral arteries largely co dominant. On the left, focal plaque seen within the left V2 segment at the level of C5 with associated moderate to severe stenosis of up to approximately 60-70% (series 7, image 239). Left vertebral artery otherwise widely patent to the skull base. On the right, focal plaque at the origin of the right vertebral artery with associated  stenosis of up to approximately 50%. Distally, there is focal plaque involving the distal right V2 segment at the level of C3 with associated short-segment moderate to severe stenosis of up to approximately 70% (series 7, image 210). Right vertebral artery otherwise patent within the neck. Attenuated flow seen within the distal vertebral arteries bilaterally related to downstream severe bilateral V4 stenoses. Skeleton: Subtle transverse linear lucency seen extending through the C2 vertebral body, favored to reflect a nutrient foramen (series 9, image 102). No associated prevertebral edema. No other acute osseous abnormality. No discrete lytic or blastic osseous lesions. Mild cervical spondylolysis at C5-6 and C6-7. Other neck: No other acute soft tissue abnormality within the neck. Upper chest: Visualized upper chest demonstrates no acute finding. Upper lobe predominant centrilobular and paraseptal emphysema. Review of the MIP images confirms the above findings CTA HEAD FINDINGS Anterior circulation: Extensive atheromatous  plaque throughout the petrous, cavernous, and supraclinoid segments bilaterally with associated moderate to severe multifocal stenoses. ICAs are patent to the termini. A1 segments patent bilaterally. Duplicated versus fenestrated anterior communicating artery. Anterior cerebral arteries patent to their distal aspects without hemodynamically significant stenosis. No M1 stenosis or occlusion. Normal MCA bifurcations. Distal MCA branches well perfused and symmetric. Posterior circulation: Vertebral arteries patent as they cross into the cranial vault. Bulky calcified plaque seen involving the proximal left V4 segment with secondary severe occlusive/pre occlusive stenosis (series 7, image 152). Faint markedly attenuated flow seen distally, with essentially no flow within the distal left V4 segment as it reaches the vertebrobasilar junction. Left PICA is perfused and patent. On the right, additional  focal atheromatous plaque with severe stenosis within the proximal right V4 segment just prior to the takeoff of the right PICA (series 7, image 144). Markedly attenuated flow within the right V4 segment distally, with a centrally absent flow by the time of the vertebrobasilar junction. Right PICA is grossly perfused proximally. No significant flow seen within the proximal and mid basilar artery. Distal reconstitution at the basilar tip, likely via collateralization from small bilateral posterior communicating arteries. Some back flow into the basilar artery proximally with perfusion of the superior cerebral arteries bilaterally. PCAs remain patent and are well perfused to their distal aspects. Venous sinuses: Grossly patent, although not well assessed due to timing of the contrast bolus. Anatomic variants: None significant. Review of the MIP images confirms the above findings IMPRESSION: 1. 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 Basilar tip is perfused via small bilateral posterior communicating arteries, with perfusion of the bilateral PCAs and SCAs. 2. Atheromatous stenoses about the carotid bifurcations/proximal ICAs bilaterally, with up to 70% narrowing on the right and 65% narrowing on the left. Additional approximate 60% stenosis involving the distal left ICA at the cervical-petrous junction. 3. Severe atheromatous plaque throughout the petrous, cavernous, and supraclinoid ICAs bilaterally with associated moderate to severe multifocal stenoses. 4. Scattered atheromatous plaque within the left CCA with up to 40-50% stenosis. 5. Approximate 50% stenosis at the origin of the right vertebral artery, with additional 60-70% stenoses involving the bilateral V2 segments downstream as above. 6. 65% atheromatous stenosis at the origin of the left subclavian artery. 7. Faint linear lucency extending through the C2 vertebral body, favored to reflect a  benign nutrient foramen. If there is a history of trauma or high clinical suspicion for possible cervical spine injury, then further assessment with dedicated cervical spine CT would be recommended. Results were discussed by telephone at the time of interpretation on 08/01/2018 at 1:29 am with Dr. Amie Portland. Electronically Signed   By: Jeannine Boga M.D.   On: 08/01/2018 02:38   Dg Chest 2 View  Result Date: 07/31/2018 CLINICAL DATA:  Weakness and dizziness. EXAM: CHEST - 2 VIEW COMPARISON:  September 03, 2012. FINDINGS: There is no appreciable edema or consolidation. Heart size and pulmonary vascularity are normal. No adenopathy. No bone lesions. IMPRESSION: No edema or consolidation.  Stable cardiac silhouette. Electronically Signed   By: Lowella Grip III M.D.   On: 07/31/2018 12:32   Ct Head Wo Contrast  Result Date: 07/31/2018 CLINICAL DATA:  Altered level of consciousness, history hypertension, coronary artery disease EXAM: CT HEAD WITHOUT CONTRAST TECHNIQUE: Contiguous axial images were obtained from the base of the skull through the vertex without intravenous contrast. Sagittal and coronal MPR images reconstructed from axial data set. COMPARISON:  None FINDINGS: Brain: Generalized atrophy. Normal ventricular morphology. No midline shift or mass effect. Small vessel chronic ischemic changes of deep cerebral white matter. No intracranial hemorrhage, mass lesion, evidence of acute infarction, or extra-axial fluid collection. Vascular: Atherosclerotic calcification of internal carotid and vertebral arteries at skull base Skull: Intact Sinuses/Orbits: Clear Other: N/A IMPRESSION: Atrophy with small vessel chronic ischemic changes of deep cerebral white matter. No acute intracranial abnormalities. Extensive atherosclerotic calcifications at the skull base particularly of the internal carotid arteries. Electronically Signed   By: Lavonia Dana M.D.   On: 07/31/2018 12:26   Ct Angio Neck W Or Wo  Contrast  Result Date: 08/01/2018 CLINICAL DATA:  Follow-up examination for acute stroke. EXAM: CT ANGIOGRAPHY HEAD AND NECK TECHNIQUE: Multidetector CT imaging of the head and neck was performed using the standard protocol during bolus administration of intravenous contrast. Multiplanar CT image reconstructions and MIPs were obtained to evaluate the vascular anatomy. Carotid stenosis measurements (when applicable) are obtained utilizing NASCET criteria, using the distal internal carotid diameter as the denominator. CONTRAST:  74mL OMNIPAQUE IOHEXOL 350 MG/ML SOLN COMPARISON:  Prior MRI and MRA from 07/31/2018 FINDINGS: CTA NECK FINDINGS Aortic arch: Visualized aortic arch of normal caliber with normal 3 vessel morphology. Scattered atheromatous plaque about the arch and origin of the great vessels. Resultant short-segment stenosis of up to approximately 65% at the origin of the left subclavian artery. Short-segment approximate 40% stenosis at the origin of the right subclavian artery. Visualized subclavian arteries otherwise widely patent. No other hemodynamically significant stenosis about the origin of the great vessels. Right carotid system: Right CCA mildly tortuous proximally. Scattered atheromatous plaque within the right common carotid artery without hemodynamically significant stenosis. Bulky calcified plaque about the right bifurcation/proximal right ICA. Associated stenosis of up to 70% by NASCET criteria at the proximal right ICA. Area of involvement begins at the bifurcation and measures approximately 2.3 cm in length. Distally, right ICA patent to the skull base without additional stenosis, dissection, or occlusion. Focal tortuosity noted within the distal cervical right ICA just prior to the skull base. Left carotid system: Scattered atheromatous plaque seen throughout the left CCA with up to approximately 40-50% stenosis. Left CCA remains patent to the bifurcation. Multifocal atheromatous plaque  about the left bifurcation/proximal left ICA r with resultant stenosis of up to 65% by NASCET criteria. Left ICA patent distally to the skull base without dissection or occlusion. Additional calcified atheromatous plaque about the distal left ICA at the level of the skull base with up to approximately 60% stenosis by NASCET criteria (series 7, image 140). Vertebral arteries: Both vertebral arteries arise from the subclavian arteries. Vertebral arteries largely co dominant. On the left, focal plaque seen within the left V2 segment at the level of C5 with associated moderate to severe stenosis of up to approximately 60-70% (series 7, image 239). Left vertebral artery otherwise widely patent to the skull base. On the right, focal plaque at the origin of the right vertebral artery with associated stenosis of up to approximately 50%. Distally, there is focal plaque involving the distal right V2 segment at the level of C3 with associated short-segment moderate to severe stenosis of up to approximately 70% (series 7, image 210). Right vertebral artery otherwise patent within the neck. Attenuated flow seen within the distal vertebral arteries bilaterally related to downstream severe bilateral V4 stenoses. Skeleton: Subtle transverse linear lucency seen extending through the C2 vertebral body, favored to reflect a nutrient foramen (series 9, image 102). No  associated prevertebral edema. No other acute osseous abnormality. No discrete lytic or blastic osseous lesions. Mild cervical spondylolysis at C5-6 and C6-7. Other neck: No other acute soft tissue abnormality within the neck. Upper chest: Visualized upper chest demonstrates no acute finding. Upper lobe predominant centrilobular and paraseptal emphysema. Review of the MIP images confirms the above findings CTA HEAD FINDINGS Anterior circulation: Extensive atheromatous plaque throughout the petrous, cavernous, and supraclinoid segments bilaterally with associated moderate  to severe multifocal stenoses. ICAs are patent to the termini. A1 segments patent bilaterally. Duplicated versus fenestrated anterior communicating artery. Anterior cerebral arteries patent to their distal aspects without hemodynamically significant stenosis. No M1 stenosis or occlusion. Normal MCA bifurcations. Distal MCA branches well perfused and symmetric. Posterior circulation: Vertebral arteries patent as they cross into the cranial vault. Bulky calcified plaque seen involving the proximal left V4 segment with secondary severe occlusive/pre occlusive stenosis (series 7, image 152). Faint markedly attenuated flow seen distally, with essentially no flow within the distal left V4 segment as it reaches the vertebrobasilar junction. Left PICA is perfused and patent. On the right, additional focal atheromatous plaque with severe stenosis within the proximal right V4 segment just prior to the takeoff of the right PICA (series 7, image 144). Markedly attenuated flow within the right V4 segment distally, with a centrally absent flow by the time of the vertebrobasilar junction. Right PICA is grossly perfused proximally. No significant flow seen within the proximal and mid basilar artery. Distal reconstitution at the basilar tip, likely via collateralization from small bilateral posterior communicating arteries. Some back flow into the basilar artery proximally with perfusion of the superior cerebral arteries bilaterally. PCAs remain patent and are well perfused to their distal aspects. Venous sinuses: Grossly patent, although not well assessed due to timing of the contrast bolus. Anatomic variants: None significant. Review of the MIP images confirms the above findings IMPRESSION: 1. 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 Basilar tip is perfused via small bilateral posterior communicating arteries, with perfusion of the bilateral PCAs and SCAs. 2.  Atheromatous stenoses about the carotid bifurcations/proximal ICAs bilaterally, with up to 70% narrowing on the right and 65% narrowing on the left. Additional approximate 60% stenosis involving the distal left ICA at the cervical-petrous junction. 3. Severe atheromatous plaque throughout the petrous, cavernous, and supraclinoid ICAs bilaterally with associated moderate to severe multifocal stenoses. 4. Scattered atheromatous plaque within the left CCA with up to 40-50% stenosis. 5. Approximate 50% stenosis at the origin of the right vertebral artery, with additional 60-70% stenoses involving the bilateral V2 segments downstream as above. 6. 65% atheromatous stenosis at the origin of the left subclavian artery. 7. Faint linear lucency extending through the C2 vertebral body, favored to reflect a benign nutrient foramen. If there is a history of trauma or high clinical suspicion for possible cervical spine injury, then further assessment with dedicated cervical spine CT would be recommended. Results were discussed by telephone at the time of interpretation on 08/01/2018 at 1:29 am with Dr. Amie Portland. Electronically Signed   By: Jeannine Boga M.D.   On: 08/01/2018 02:38   Mr Angio Head Wo Contrast  Result Date: 07/31/2018 CLINICAL DATA:  Headache, dizziness and gait disturbance. Blurred vision. Left-sided weakness. Symptoms began yesterday. EXAM: MRI HEAD WITHOUT CONTRAST MRA HEAD WITHOUT CONTRAST TECHNIQUE: Multiplanar, multiecho pulse sequences of the brain and surrounding structures were obtained without intravenous contrast. Angiographic images of the head were obtained using MRA technique  without contrast. COMPARISON:  CT 07/31/2018 FINDINGS: MRI HEAD FINDINGS Brain: Diffusion imaging shows multiple foci of acute infarction affecting the pons, more to the right of midline than the left. No other acute infarction. Elsewhere, there is no cerebellar insult. Cerebral hemispheres show mild chronic  small-vessel change of the deep and subcortical white matter. No large vessel territory infarction. No mass lesion, hemorrhage, hydrocephalus or extra-axial collection. Vascular: Major vessels at the base of the brain show flow. Skull and upper cervical spine: Negative Sinuses/Orbits: Clear/normal Other: None MRA HEAD FINDINGS Both internal carotid arteries are patent through the skull base and siphon regions. No siphon stenosis. The anterior and middle cerebral vessels are patent without proximal stenosis, aneurysm or vascular malformation. Both vertebral arteries show flow at the foramen magnum level and give supply to both posteroinferior cerebellar arteries. No flow is seen distal to that in the V4 segments reaching the basilar. No flow is seen in the basilar artery until the basilar tip, where there is reconstituted flow supplying both superior cerebellar and posterior cerebral arteries. Patent posterior communicating arteries are responsible for this supply. 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 Both posteroinferior cerebellar arteries receive flow. Patent posterior communicating arteries on each side reconstitute the basilar tip and give supply to the superior cerebellar and posterior cerebral arteries. Electronically Signed   By: Nelson Chimes M.D.   On: 07/31/2018 14:45   Mr Brain Wo Contrast  Result Date: 07/31/2018 CLINICAL DATA:  Headache, dizziness and gait disturbance. Blurred vision. Left-sided weakness. Symptoms began yesterday. EXAM: MRI HEAD WITHOUT CONTRAST MRA HEAD WITHOUT CONTRAST TECHNIQUE: Multiplanar, multiecho pulse sequences of the brain and surrounding structures were obtained without intravenous contrast. Angiographic images of the head were obtained using MRA technique without  contrast. COMPARISON:  CT 07/31/2018 FINDINGS: MRI HEAD FINDINGS Brain: Diffusion imaging shows multiple foci of acute infarction affecting the pons, more to the right of midline than the left. No other acute infarction. Elsewhere, there is no cerebellar insult. Cerebral hemispheres show mild chronic small-vessel change of the deep and subcortical white matter. No large vessel territory infarction. No mass lesion, hemorrhage, hydrocephalus or extra-axial collection. Vascular: Major vessels at the base of the brain show flow. Skull and upper cervical spine: Negative Sinuses/Orbits: Clear/normal Other: None MRA HEAD FINDINGS Both internal carotid arteries are patent through the skull base and siphon regions. No siphon stenosis. The anterior and middle cerebral vessels are patent without proximal stenosis, aneurysm or vascular malformation. Both vertebral arteries show flow at the foramen magnum level and give supply to both posteroinferior cerebellar arteries. No flow is seen distal to that in the V4 segments reaching the basilar. No flow is seen in the basilar artery until the basilar tip, where there is reconstituted flow supplying both superior cerebellar and posterior cerebral arteries. Patent posterior communicating arteries are responsible for this supply. 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 Both posteroinferior cerebellar arteries receive flow. Patent posterior communicating arteries on each side reconstitute the basilar tip and give supply to the superior cerebellar and posterior cerebral arteries. Electronically Signed   By: Nelson Chimes M.D.   On: 07/31/2018 14:45  Medications:    aspirin  325 mg Oral Daily   atorvastatin  80 mg Oral QPM   clopidogrel  75 mg Oral Daily   enoxaparin  (LOVENOX) injection  40 mg Subcutaneous O71Q   folic acid  1 mg Oral Daily   lisinopril  20 mg Oral BID   LORazepam  0-4 mg Oral Q6H   Followed by   Derrill Memo ON 08/02/2018] LORazepam  0-4 mg Oral Q12H   metoprolol tartrate  12.5 mg Oral BID   multivitamin with minerals  1 tablet Oral Daily   pantoprazole  40 mg Oral Daily   thiamine  100 mg Oral Daily   Or   thiamine  100 mg Intravenous Daily   Continuous Infusions:   LOS: 0 days   Geradine Girt  [facility]   How to contact the [facility] Attending or Consulting provider [place] or covering provider during after hours [place], for this patient?  1. Check the care team in [facility] and look for a) attending/consulting [facility] provider listed and b) the [facility] team listed 2. Log into [email or web address] and use [place]'s universal password to access. If you do not have the password, please contact the hospital operator. 3. Locate the [facility] provider you are looking for under [facility] and page to a number that you can be directly reached. 4. If you still have difficulty reaching the provider, please page the [facility] (Director on Call) for the Hospitalists listed on amion for assistance.  08/01/2018, 12:18 PM

## 2018-08-01 NOTE — Progress Notes (Signed)
STROKE TEAM PROGRESS NOTE   INTERVAL HISTORY No family is at the bedside.  Pt stated that he has back to his baseline. He walked with PT/OT today and no walking difficulty. He is asking to go home tomorrow after cerebral angiogram.  Vitals:   07/31/18 2315 08/01/18 0039 08/01/18 0343 08/01/18 0752  BP: (!) 182/93 134/77 (!) 168/109 (!) 215/116  Pulse: 75 71 70 64  Resp: 18 18 18 18   Temp: 97.9 F (36.6 C) 97.8 F (36.6 C) 97.8 F (36.6 C) (!) 97.3 F (36.3 C)  TempSrc: Oral Oral Oral Oral  SpO2: 100% 98% 97% 99%  Weight:      Height:        CBC:  Recent Labs  Lab 07/31/18 1104 07/31/18 2211  WBC 8.4 9.1  NEUTROABS 6.5  --   HGB 15.0 15.1  HCT 44.3 43.0  MCV 95.7 93.3  PLT 262 782    Basic Metabolic Panel:  Recent Labs  Lab 07/31/18 1104 07/31/18 2211  NA 129*  --   K 3.8  --   CL 95*  --   CO2 23  --   GLUCOSE 126*  --   BUN 13  --   CREATININE 0.83 1.01  CALCIUM 9.7  --    Lipid Panel:     Component Value Date/Time   CHOL 194 08/01/2018 0351   TRIG 98 08/01/2018 0351   HDL 73 08/01/2018 0351   CHOLHDL 2.7 08/01/2018 0351   VLDL 20 08/01/2018 0351   LDLCALC 101 (H) 08/01/2018 0351   HgbA1c:  Lab Results  Component Value Date   HGBA1C 5.4 08/01/2018   Urine Drug Screen: No results found for: LABOPIA, COCAINSCRNUR, LABBENZ, AMPHETMU, THCU, LABBARB  Alcohol Level No results found for: ETH  IMAGING Ct Angio Head W Or Wo Contrast  Result Date: 08/01/2018 CLINICAL DATA:  Follow-up examination for acute stroke. EXAM: CT ANGIOGRAPHY HEAD AND NECK TECHNIQUE: Multidetector CT imaging of the head and neck was performed using the standard protocol during bolus administration of intravenous contrast. Multiplanar CT image reconstructions and MIPs were obtained to evaluate the vascular anatomy. Carotid stenosis measurements (when applicable) are obtained utilizing NASCET criteria, using the distal internal carotid diameter as the denominator. CONTRAST:  89mL  OMNIPAQUE IOHEXOL 350 MG/ML SOLN COMPARISON:  Prior MRI and MRA from 07/31/2018 FINDINGS: CTA NECK FINDINGS Aortic arch: Visualized aortic arch of normal caliber with normal 3 vessel morphology. Scattered atheromatous plaque about the arch and origin of the great vessels. Resultant short-segment stenosis of up to approximately 65% at the origin of the left subclavian artery. Short-segment approximate 40% stenosis at the origin of the right subclavian artery. Visualized subclavian arteries otherwise widely patent. No other hemodynamically significant stenosis about the origin of the great vessels. Right carotid system: Right CCA mildly tortuous proximally. Scattered atheromatous plaque within the right common carotid artery without hemodynamically significant stenosis. Bulky calcified plaque about the right bifurcation/proximal right ICA. Associated stenosis of up to 70% by NASCET criteria at the proximal right ICA. Area of involvement begins at the bifurcation and measures approximately 2.3 cm in length. Distally, right ICA patent to the skull base without additional stenosis, dissection, or occlusion. Focal tortuosity noted within the distal cervical right ICA just prior to the skull base. Left carotid system: Scattered atheromatous plaque seen throughout the left CCA with up to approximately 40-50% stenosis. Left CCA remains patent to the bifurcation. Multifocal atheromatous plaque about the left bifurcation/proximal left ICA r with resultant  stenosis of up to 65% by NASCET criteria. Left ICA patent distally to the skull base without dissection or occlusion. Additional calcified atheromatous plaque about the distal left ICA at the level of the skull base with up to approximately 60% stenosis by NASCET criteria (series 7, image 140). Vertebral arteries: Both vertebral arteries arise from the subclavian arteries. Vertebral arteries largely co dominant. On the left, focal plaque seen within the left V2 segment at the  level of C5 with associated moderate to severe stenosis of up to approximately 60-70% (series 7, image 239). Left vertebral artery otherwise widely patent to the skull base. On the right, focal plaque at the origin of the right vertebral artery with associated stenosis of up to approximately 50%. Distally, there is focal plaque involving the distal right V2 segment at the level of C3 with associated short-segment moderate to severe stenosis of up to approximately 70% (series 7, image 210). Right vertebral artery otherwise patent within the neck. Attenuated flow seen within the distal vertebral arteries bilaterally related to downstream severe bilateral V4 stenoses. Skeleton: Subtle transverse linear lucency seen extending through the C2 vertebral body, favored to reflect a nutrient foramen (series 9, image 102). No associated prevertebral edema. No other acute osseous abnormality. No discrete lytic or blastic osseous lesions. Mild cervical spondylolysis at C5-6 and C6-7. Other neck: No other acute soft tissue abnormality within the neck. Upper chest: Visualized upper chest demonstrates no acute finding. Upper lobe predominant centrilobular and paraseptal emphysema. Review of the MIP images confirms the above findings CTA HEAD FINDINGS Anterior circulation: Extensive atheromatous plaque throughout the petrous, cavernous, and supraclinoid segments bilaterally with associated moderate to severe multifocal stenoses. ICAs are patent to the termini. A1 segments patent bilaterally. Duplicated versus fenestrated anterior communicating artery. Anterior cerebral arteries patent to their distal aspects without hemodynamically significant stenosis. No M1 stenosis or occlusion. Normal MCA bifurcations. Distal MCA branches well perfused and symmetric. Posterior circulation: Vertebral arteries patent as they cross into the cranial vault. Bulky calcified plaque seen involving the proximal left V4 segment with secondary severe  occlusive/pre occlusive stenosis (series 7, image 152). Faint markedly attenuated flow seen distally, with essentially no flow within the distal left V4 segment as it reaches the vertebrobasilar junction. Left PICA is perfused and patent. On the right, additional focal atheromatous plaque with severe stenosis within the proximal right V4 segment just prior to the takeoff of the right PICA (series 7, image 144). Markedly attenuated flow within the right V4 segment distally, with a centrally absent flow by the time of the vertebrobasilar junction. Right PICA is grossly perfused proximally. No significant flow seen within the proximal and mid basilar artery. Distal reconstitution at the basilar tip, likely via collateralization from small bilateral posterior communicating arteries. Some back flow into the basilar artery proximally with perfusion of the superior cerebral arteries bilaterally. PCAs remain patent and are well perfused to their distal aspects. Venous sinuses: Grossly patent, although not well assessed due to timing of the contrast bolus. Anatomic variants: None significant. Review of the MIP images confirms the above findings IMPRESSION: 1. 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 Basilar tip is perfused via small bilateral posterior communicating arteries, with perfusion of the bilateral PCAs and SCAs. 2. Atheromatous stenoses about the carotid bifurcations/proximal ICAs bilaterally, with up to 70% narrowing on the right and 65% narrowing on the left. Additional approximate 60% stenosis involving the distal left ICA at the cervical-petrous junction. 3.  Severe atheromatous plaque throughout the petrous, cavernous, and supraclinoid ICAs bilaterally with associated moderate to severe multifocal stenoses. 4. Scattered atheromatous plaque within the left CCA with up to 40-50% stenosis. 5. Approximate 50% stenosis at the origin of the right  vertebral artery, with additional 60-70% stenoses involving the bilateral V2 segments downstream as above. 6. 65% atheromatous stenosis at the origin of the left subclavian artery. 7. Faint linear lucency extending through the C2 vertebral body, favored to reflect a benign nutrient foramen. If there is a history of trauma or high clinical suspicion for possible cervical spine injury, then further assessment with dedicated cervical spine CT would be recommended. Results were discussed by telephone at the time of interpretation on 08/01/2018 at 1:29 am with Dr. Amie Portland. Electronically Signed   By: Jeannine Boga M.D.   On: 08/01/2018 02:38   Dg Chest 2 View  Result Date: 07/31/2018 CLINICAL DATA:  Weakness and dizziness. EXAM: CHEST - 2 VIEW COMPARISON:  September 03, 2012. FINDINGS: There is no appreciable edema or consolidation. Heart size and pulmonary vascularity are normal. No adenopathy. No bone lesions. IMPRESSION: No edema or consolidation.  Stable cardiac silhouette. Electronically Signed   By: Lowella Grip III M.D.   On: 07/31/2018 12:32   Ct Head Wo Contrast  Result Date: 07/31/2018 CLINICAL DATA:  Altered level of consciousness, history hypertension, coronary artery disease EXAM: CT HEAD WITHOUT CONTRAST TECHNIQUE: Contiguous axial images were obtained from the base of the skull through the vertex without intravenous contrast. Sagittal and coronal MPR images reconstructed from axial data set. COMPARISON:  None FINDINGS: Brain: Generalized atrophy. Normal ventricular morphology. No midline shift or mass effect. Small vessel chronic ischemic changes of deep cerebral white matter. No intracranial hemorrhage, mass lesion, evidence of acute infarction, or extra-axial fluid collection. Vascular: Atherosclerotic calcification of internal carotid and vertebral arteries at skull base Skull: Intact Sinuses/Orbits: Clear Other: N/A IMPRESSION: Atrophy with small vessel chronic ischemic changes  of deep cerebral white matter. No acute intracranial abnormalities. Extensive atherosclerotic calcifications at the skull base particularly of the internal carotid arteries. Electronically Signed   By: Lavonia Dana M.D.   On: 07/31/2018 12:26   Ct Angio Neck W Or Wo Contrast  Result Date: 08/01/2018 CLINICAL DATA:  Follow-up examination for acute stroke. EXAM: CT ANGIOGRAPHY HEAD AND NECK TECHNIQUE: Multidetector CT imaging of the head and neck was performed using the standard protocol during bolus administration of intravenous contrast. Multiplanar CT image reconstructions and MIPs were obtained to evaluate the vascular anatomy. Carotid stenosis measurements (when applicable) are obtained utilizing NASCET criteria, using the distal internal carotid diameter as the denominator. CONTRAST:  49mL OMNIPAQUE IOHEXOL 350 MG/ML SOLN COMPARISON:  Prior MRI and MRA from 07/31/2018 FINDINGS: CTA NECK FINDINGS Aortic arch: Visualized aortic arch of normal caliber with normal 3 vessel morphology. Scattered atheromatous plaque about the arch and origin of the great vessels. Resultant short-segment stenosis of up to approximately 65% at the origin of the left subclavian artery. Short-segment approximate 40% stenosis at the origin of the right subclavian artery. Visualized subclavian arteries otherwise widely patent. No other hemodynamically significant stenosis about the origin of the great vessels. Right carotid system: Right CCA mildly tortuous proximally. Scattered atheromatous plaque within the right common carotid artery without hemodynamically significant stenosis. Bulky calcified plaque about the right bifurcation/proximal right ICA. Associated stenosis of up to 70% by NASCET criteria at the proximal right ICA. Area of involvement begins at the bifurcation and measures approximately 2.3 cm in  length. Distally, right ICA patent to the skull base without additional stenosis, dissection, or occlusion. Focal tortuosity  noted within the distal cervical right ICA just prior to the skull base. Left carotid system: Scattered atheromatous plaque seen throughout the left CCA with up to approximately 40-50% stenosis. Left CCA remains patent to the bifurcation. Multifocal atheromatous plaque about the left bifurcation/proximal left ICA r with resultant stenosis of up to 65% by NASCET criteria. Left ICA patent distally to the skull base without dissection or occlusion. Additional calcified atheromatous plaque about the distal left ICA at the level of the skull base with up to approximately 60% stenosis by NASCET criteria (series 7, image 140). Vertebral arteries: Both vertebral arteries arise from the subclavian arteries. Vertebral arteries largely co dominant. On the left, focal plaque seen within the left V2 segment at the level of C5 with associated moderate to severe stenosis of up to approximately 60-70% (series 7, image 239). Left vertebral artery otherwise widely patent to the skull base. On the right, focal plaque at the origin of the right vertebral artery with associated stenosis of up to approximately 50%. Distally, there is focal plaque involving the distal right V2 segment at the level of C3 with associated short-segment moderate to severe stenosis of up to approximately 70% (series 7, image 210). Right vertebral artery otherwise patent within the neck. Attenuated flow seen within the distal vertebral arteries bilaterally related to downstream severe bilateral V4 stenoses. Skeleton: Subtle transverse linear lucency seen extending through the C2 vertebral body, favored to reflect a nutrient foramen (series 9, image 102). No associated prevertebral edema. No other acute osseous abnormality. No discrete lytic or blastic osseous lesions. Mild cervical spondylolysis at C5-6 and C6-7. Other neck: No other acute soft tissue abnormality within the neck. Upper chest: Visualized upper chest demonstrates no acute finding. Upper lobe  predominant centrilobular and paraseptal emphysema. Review of the MIP images confirms the above findings CTA HEAD FINDINGS Anterior circulation: Extensive atheromatous plaque throughout the petrous, cavernous, and supraclinoid segments bilaterally with associated moderate to severe multifocal stenoses. ICAs are patent to the termini. A1 segments patent bilaterally. Duplicated versus fenestrated anterior communicating artery. Anterior cerebral arteries patent to their distal aspects without hemodynamically significant stenosis. No M1 stenosis or occlusion. Normal MCA bifurcations. Distal MCA branches well perfused and symmetric. Posterior circulation: Vertebral arteries patent as they cross into the cranial vault. Bulky calcified plaque seen involving the proximal left V4 segment with secondary severe occlusive/pre occlusive stenosis (series 7, image 152). Faint markedly attenuated flow seen distally, with essentially no flow within the distal left V4 segment as it reaches the vertebrobasilar junction. Left PICA is perfused and patent. On the right, additional focal atheromatous plaque with severe stenosis within the proximal right V4 segment just prior to the takeoff of the right PICA (series 7, image 144). Markedly attenuated flow within the right V4 segment distally, with a centrally absent flow by the time of the vertebrobasilar junction. Right PICA is grossly perfused proximally. No significant flow seen within the proximal and mid basilar artery. Distal reconstitution at the basilar tip, likely via collateralization from small bilateral posterior communicating arteries. Some back flow into the basilar artery proximally with perfusion of the superior cerebral arteries bilaterally. PCAs remain patent and are well perfused to their distal aspects. Venous sinuses: Grossly patent, although not well assessed due to timing of the contrast bolus. Anatomic variants: None significant. Review of the MIP images confirms  the above findings IMPRESSION: 1. Focal severe bilateral  V4 stenoses, with occlusion of the vertebral arteries and basilar artery distally. Basilar tip is perfused via small bilateral posterior communicating arteries, with perfusion of the bilateral PCAs and SCAs. 2. Atheromatous stenoses about the carotid bifurcations/proximal ICAs bilaterally, with up to 70% narrowing on the right and 65% narrowing on the left. Additional approximate 60% stenosis involving the distal left ICA at the cervical-petrous junction. 3. Severe atheromatous plaque throughout the petrous, cavernous, and supraclinoid ICAs bilaterally with associated moderate to severe multifocal stenoses. 4. Scattered atheromatous plaque within the left CCA with up to 40-50% stenosis. 5. Approximate 50% stenosis at the origin of the right vertebral artery, with additional 60-70% stenoses involving the bilateral V2 segments downstream as above. 6. 65% atheromatous stenosis at the origin of the left subclavian artery. 7. Faint linear lucency extending through the C2 vertebral body, favored to reflect a benign nutrient foramen. If there is a history of trauma or high clinical suspicion for possible cervical spine injury, then further assessment with dedicated cervical spine CT would be recommended. Results were discussed by telephone at the time of interpretation on 08/01/2018 at 1:29 am with Dr. Amie Portland. Electronically Signed   By: Jeannine Boga M.D.   On: 08/01/2018 02:38   Mr Angio Head Wo Contrast  Result Date: 07/31/2018 CLINICAL DATA:  Headache, dizziness and gait disturbance. Blurred vision. Left-sided weakness. Symptoms began yesterday. EXAM: MRI HEAD WITHOUT CONTRAST MRA HEAD WITHOUT CONTRAST TECHNIQUE: Multiplanar, multiecho pulse sequences of the brain and surrounding structures were obtained without intravenous contrast. Angiographic images of the head were obtained using MRA technique without contrast. COMPARISON:  CT 07/31/2018  FINDINGS: MRI HEAD FINDINGS Brain: Diffusion imaging shows multiple foci of acute infarction affecting the pons, more to the right of midline than the left. No other acute infarction. Elsewhere, there is no cerebellar insult. Cerebral hemispheres show mild chronic small-vessel change of the deep and subcortical white matter. No large vessel territory infarction. No mass lesion, hemorrhage, hydrocephalus or extra-axial collection. Vascular: Major vessels at the base of the brain show flow. Skull and upper cervical spine: Negative Sinuses/Orbits: Clear/normal Other: None MRA HEAD FINDINGS Both internal carotid arteries are patent through the skull base and siphon regions. No siphon stenosis. The anterior and middle cerebral vessels are patent without proximal stenosis, aneurysm or vascular malformation. Both vertebral arteries show flow at the foramen magnum level and give supply to both posteroinferior cerebellar arteries. No flow is seen distal to that in the V4 segments reaching the basilar. No flow is seen in the basilar artery until the basilar tip, where there is reconstituted flow supplying both superior cerebellar and posterior cerebral arteries. Patent posterior communicating arteries are responsible for this supply. 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 Both posteroinferior cerebellar arteries receive flow. Patent posterior communicating arteries on each side reconstitute the basilar tip and give supply to the superior cerebellar and posterior cerebral arteries. Electronically Signed   By: Nelson Chimes M.D.   On: 07/31/2018 14:45   Mr Brain Wo Contrast  Result Date: 07/31/2018 CLINICAL DATA:  Headache, dizziness and gait disturbance. Blurred vision. Left-sided weakness. Symptoms began yesterday.  EXAM: MRI HEAD WITHOUT CONTRAST MRA HEAD WITHOUT CONTRAST TECHNIQUE: Multiplanar, multiecho pulse sequences of the brain and surrounding structures were obtained without intravenous contrast. Angiographic images of the head were obtained using  MRA technique without contrast. COMPARISON:  CT 07/31/2018 FINDINGS: MRI HEAD FINDINGS Brain: Diffusion imaging shows multiple foci of acute infarction affecting the pons, more to the right of midline than the left. No other acute infarction. Elsewhere, there is no cerebellar insult. Cerebral hemispheres show mild chronic small-vessel change of the deep and subcortical white matter. No large vessel territory infarction. No mass lesion, hemorrhage, hydrocephalus or extra-axial collection. Vascular: Major vessels at the base of the brain show flow. Skull and upper cervical spine: Negative Sinuses/Orbits: Clear/normal Other: None MRA HEAD FINDINGS Both internal carotid arteries are patent through the skull base and siphon regions. No siphon stenosis. The anterior and middle cerebral vessels are patent without proximal stenosis, aneurysm or vascular malformation. Both vertebral arteries show flow at the foramen magnum level and give supply to both posteroinferior cerebellar arteries. No flow is seen distal to that in the V4 segments reaching the basilar. No flow is seen in the basilar artery until the basilar tip, where there is reconstituted flow supplying both superior cerebellar and posterior cerebral arteries. Patent posterior communicating arteries are responsible for this supply. 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 Both posteroinferior cerebellar arteries receive flow. Patent posterior communicating arteries on each side reconstitute the basilar tip and give supply  to the superior cerebellar and posterior cerebral arteries. Electronically Signed   By: Nelson Chimes M.D.   On: 07/31/2018 14:45    PHYSICAL EXAM  Temp:  [97.3 F (36.3 C)-98.1 F (36.7 C)] 97.7 F (36.5 C) (07/30 1223) Pulse Rate:  [62-76] 66 (07/30 1223) Resp:  [12-20] 18 (07/30 1223) BP: (134-231)/(77-127) 191/93 (07/30 1223) SpO2:  [97 %-100 %] 99 % (07/30 1223) Weight:  [72.6 kg] 72.6 kg (07/29 2139)  General - Well nourished, well developed, in no apparent distress.  Ophthalmologic - fundi not visualized due to noncooperation.  Cardiovascular - Regular rate and rhythm.  Mental Status -  Level of arousal and orientation to time, place, and person were intact. Language including expression, naming, repetition, comprehension was assessed and found intact. Fund of Knowledge was assessed and was intact.  Cranial Nerves II - XII - II - Visual field intact OU. III, IV, VI - Extraocular movements intact. V - Facial sensation intact bilaterally. VII - Facial movement intact bilaterally. VIII - Hearing & vestibular intact bilaterally. X - Palate elevates symmetrically. XI - Chin turning & shoulder shrug intact bilaterally. XII - Tongue protrusion intact.  Motor Strength - The patient's strength was normal in all extremities and pronator drift was absent.  Bulk was normal and fasciculations were absent.   Motor Tone - Muscle tone was assessed at the neck and appendages and was normal.  Reflexes - The patient's reflexes were symmetrical in all extremities and he had no pathological reflexes.  Sensory - Light touch, temperature/pinprick were assessed and were symmetrical.    Coordination - The patient had normal movements in the hands and feet with no ataxia or dysmetria.  Tremor was absent.  Gait and Station - deferred.   ASSESSMENT/PLAN [name] is a 58 y.o. male with history of chronic LBP, HTN, HLD, CAD presenting to [facility] ED with blurred vision, ataxia,  incoordination, weakness in legs..   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 Small vessel disease. Atrophy.   MRI  R>L pontine infarcts. Small vessel disease. Atrophy.   MRA  Occlusion B VAs  CTA head & neck B V4 stenoses w/ occlusion distal VA and BA distally. R ICA 70% and L ICA 65% narrowing. Distal L ICA 60% at cervical-petrous jxn. Severe ICA atherosclerosis. L CCA 40-50%. R VA 50%, B V2 60-70%. L subclavian 65%.  Cerebral angio pending  2D Echo pending  LDL 101  HgbA1c 5.4  UDS pending  Lovenox 40 mg sq daily for VTE prophylaxis  aspirin 81 mg daily prior to admission, now on aspirin 325 mg daily and clopidogrel 75 mg daily folloing DAPT load. Continue DAPT on DC  Therapy recommendations:  No OT, No SLP  Disposition:  pending   Intra- and extracranial stenosis  CTA head & neck B V4 stenoses w/ occlusion distal VA and BA distally. R ICA 70% and L ICA 65% narrowing. Distal L ICA 60% at cervical-petrous jxn. Severe ICA atherosclerosis. L CCA 40-50%. R VA 50%, B V2 60-70%. L subclavian 65%.  Chronic vasculopathy  Cerebral angiogram pending  Hypertensive Emergency  BP as high as 244/114  Remains elevated this am  Home meds - lisinopril and metoprolol  Resume home meds . Permissive hypertension (OK if < 180/105) but gradually normalize in 3-5 days . Long-term BP goal 130-160 given severe vascular stenosis  Hyperlipidemia  Home meds:  lipitior 80, resumed in hospital  LDL 101, goal < 70  Continue statin at discharge  Heavy ETOH use  drinks 6-pack/day  advised to drink no more than 2 drink(s) a day   on CIWA  On FA/B1/MVI  Other Stroke Risk Factors  Former Cigarette smoker, quit in 2008  Family hx stroke (grandfather)  Coronary artery disease s/p DES 2014  Other Active Problems  Hyponatremia Na 129  Chronic LBP  GERD  Hospital day # 0  Rosalin Hawking, MD  PhD Stroke Neurology 08/01/2018 2:34 PM    To contact Stroke Continuity provider, please refer to [email or web address] After hours, contact General Neurology

## 2018-08-01 NOTE — Evaluation (Signed)
Occupational Therapy Evaluation Patient Details Name: [name] MRN: [id number] DOB: 08/28/1960 Today's Date: 08/01/2018    History of Present Illness Pt is a 58 yo male s/p b/l pontine infarcts on CT head with occlusions of R vertebral a., L subclavian A. Pt presented with fall, blurry vision. PMHx: low back pain, HTN, HLD, CAd, Anxiety, bradycardia, and etoh use.   Clinical Impression   Pt PTA living with spouse and reports independence. Pt currently with no focal deficits at this time. Pt independent with mobility and transfers. Pt independent with ADL. Pt feels back to normal.Pt independent with DGI.  Pt sensation and proprioception WNLs. Pt does not require continued OT skilled services. OT signing off.      Follow Up Recommendations  No OT follow up    Equipment Recommendations  None recommended by OT    Recommendations for Other Services       Precautions / Restrictions        Mobility Bed Mobility Overal bed mobility: Independent             General bed mobility comments: no assist required  Transfers Overall transfer level: Independent Equipment used: None             General transfer comment: no physical assist required    Balance Overall balance assessment: Modified Independent                                         ADL either performed or assessed with clinical judgement   ADL Overall ADL's : Modified independent                                       General ADL Comments: Pt requiring no assist at this time     Vision Baseline Vision/History: No visual deficits Vision Assessment?: Yes Eye Alignment: Within Functional Limits Ocular Range of Motion: Within Functional Limits Alignment/Gaze Preference: Within Defined Limits Tracking/Visual Pursuits: Able to track stimulus in all quads without difficulty Saccades: Within functional limits Convergence: Within functional limits Visual Fields: No apparent  deficits     Perception     Praxis      Pertinent Vitals/Pain Pain Assessment: No/denies pain     Hand Dominance Right   Extremity/Trunk Assessment Upper Extremity Assessment Upper Extremity Assessment: Overall WFL for tasks assessed   Lower Extremity Assessment Lower Extremity Assessment: Overall WFL for tasks assessed;Defer to PT evaluation   Cervical / Trunk Assessment Cervical / Trunk Assessment: Normal   Communication Communication Communication: No difficulties   Cognition Arousal/Alertness: Awake/alert Behavior During Therapy: WFL for tasks assessed/performed Overall Cognitive Status: Within Functional Limits for tasks assessed                                     General Comments  HR 84 BPM with activity    Exercises     Shoulder Instructions      Home Living Family/patient expects to be discharged to:: Private residence Living Arrangements: Spouse/significant other Available Help at Discharge: Family;Available 24 hours/day Type of Home: House Home Access: Stairs to enter [employer] of Steps: 4 Entrance Stairs-Rails: Can reach both Home Layout: One level     Bathroom Shower/Tub: Tub/shower unit;Walk-in shower  Bathroom Toilet: Standard     Home Equipment: Grab bars - tub/shower      Lives With: Spouse;Other (Comment)(mother-in-law)    Prior Functioning/Environment Level of Independence: Independent        Comments: working        OT Problem List:        OT Treatment/Interventions:      OT Goals(Current goals can be found in the care plan section) Acute Rehab OT Goals Patient Stated Goal: to go home soon OT Goal Formulation: With patient Time For Goal Achievement: 08/15/18 Potential to Achieve Goals: Good  OT Frequency:     Barriers to D/C:            Co-evaluation              AM-PAC OT "6 Clicks" Daily Activity     Outcome Measure Help from another person eating meals?: None Help from  another person taking care of personal grooming?: None Help from another person toileting, which includes using toliet, bedpan, or urinal?: None Help from another person bathing (including washing, rinsing, drying)?: None Help from another person to put on and taking off regular upper body clothing?: None Help from another person to put on and taking off regular lower body clothing?: None 6 Click Score: 24   End of Session Equipment Utilized During Treatment: Gait belt Nurse Communication: Mobility status  Activity Tolerance: Patient tolerated treatment well Patient left: in chair;with call bell/phone within reach  OT Visit Diagnosis: Unsteadiness on feet (R26.81);Muscle weakness (generalized) (M62.81)                Time: 1884-1660 OT Time Calculation (min): 19 min Charges:  OT General Charges $OT Visit: 1 Visit OT Evaluation $OT Eval Moderate Complexity: 1 Mod  Darryl Nestle) Marsa Aris OTR/L Acute Rehabilitation Services Pager: [phone] Office: [place] 08/01/2018, 9:59 AM

## 2018-08-01 NOTE — Evaluation (Signed)
Speech Language Pathology Evaluation Patient Details Name: [name] MRN: [id number] DOB: [birth year] Today's Date: 08/01/2018 Time: 0910-0922 SLP Time Calculation (min) (ACUTE ONLY): 12 min  Problem List:  Patient Active Problem List   Diagnosis Date Noted  . Stroke ([place]) 07/31/2018  . Rotator cuff syndrome of right shoulder 02/20/2013  . Bradycardia 11/06/2012  . Coronary artery disease   . Abnormal finding on cardiovascular stress test 09/25/2012  . Hypertension 09/03/2012  . Chest pain 09/03/2012  . Alcohol abuse, daily use 09/03/2012  . Hyperlipidemia 09/03/2012  . Hyponatremia 09/03/2012   Past Medical History:  Past Medical History:  Diagnosis Date  . Anxiety   . Arthritis   . Bradycardia    a. During 11/2012 adm: lopressor decreased.  . 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 Coronary artery disease    a. Diag cath 10/2012 for CP/abnormal nuc -> planned PCI s/p LAD atherectomy/DES placement 11/05/12.  Marland Kitchen GERD (gastroesophageal reflux disease)   . Hypercholesteremia   . Hypertension    Past Surgical History:  Past Surgical History:  Procedure Laterality Date  . CARDIAC CATHETERIZATION  10/29/2012  . CORONARY ANGIOPLASTY WITH STENT PLACEMENT  11/05/2012  . HEMORRHOID SURGERY  ~ 2010  . INSERTION OF MESH N/A 06/02/2015   Procedure: INSERTION OF MESH;  Surgeon: [name], MD;  Location: [facility];  Service: General;  Laterality: N/A;  . LEFT HEART CATHETERIZATION WITH CORONARY ANGIOGRAM N/A 10/30/2012   Procedure: LEFT HEART CATHETERIZATION WITH CORONARY ANGIOGRAM;  Surgeon: [name], MD;  Location: [facility];  Service: Cardiovascular;  Laterality: N/A;  . PERCUTANEOUS CORONARY ROTOBLATOR INTERVENTION (PCI-R) N/A 11/05/2012   Procedure: PERCUTANEOUS CORONARY ROTOBLATOR INTERVENTION (PCI-R);  Surgeon: [name], MD;  [facility] CATH [facility];  Service: Cardiovascular;   Laterality: N/A;  . UMBILICAL HERNIA REPAIR N/A 06/02/2015   Procedure: UMBILICAL HERNIORRHAPHY WITH MESH;  Surgeon: [name], MD;  Location: [facility];  Service: General;  Laterality: N/A;   HPI:  Pt is a 58 y.o. male with past medical history significant for HTN, HL, daily alcohol use (reported 6 pack per day) and previous CAD who presented secondary to sudden onset difficulty walking. MRI of the brain revealed acute infarction affecting the pons, more extensive on the right than the left.   Assessment / Plan / Recommendation Clinical Impression  Pt reported that he is unemployed and lives with his wife and mother-in-law. He reported that he is usually "slow" and requires additional processing time for retrieval of some words and for more complex information. He denied any changes in speech, language or cognition compared to his baseline. Minimal articulatory imprecision was noted but pt stated that this is his baseline. His language skills are currently within normal limits and his cognitive-linguistic skills were within functional limits but his reported need for additional processing time was demonstrated with complex problem solving. Further skilled SLP services are not clinically indicated at this time. Pt and nursing were educated regarding results and recommendations; both parties verbalized understanding as well as agreement with plan of care.    SLP Assessment  SLP Recommendation/Assessment: Patient does not need any further Speech Lanaguage Pathology Services SLP Visit Diagnosis: Dysarthria and anarthria (R47.1)    Follow Up Recommendations  None    Frequency and Duration           SLP Evaluation Cognition  Overall Cognitive Status: Within  Functional Limits for tasks assessed Orientation Level: Oriented X4 Attention: Focused;Sustained Focused Attention: Appears intact Sustained Attention: Appears intact Memory: Impaired Memory Impairment: Decreased recall of new  information;Storage deficit;Retrieval deficit(Immediate: 3/3; delayed: 2/3 with cues: 1/1) Awareness: Appears intact Problem Solving: Appears intact(Safety: WNL; Time management: min cues needed)       Comprehension  Auditory Comprehension Overall Auditory Comprehension: Appears within functional limits for tasks assessed Yes/No Questions: Within Functional Limits Basic Biographical Questions: (5/5) Complex Questions: (4/5) Paragraph Comprehension (via yes/no questions): (2/4) Commands: Within Functional Limits Two Step Basic Commands: (4/4) Multistep Basic Commands: (4/4) Conversation: Complex Visual Recognition/Discrimination Discrimination: Within Function Limits Reading Comprehension Reading Status: Within funtional limits    Expression Expression Primary Mode of Expression: Verbal Verbal Expression Overall Verbal Expression: Appears within functional limits for tasks assessed Initiation: No impairment Automatic Speech: Counting;Month of year;Day of week(WFL) Level of Generative/Spontaneous Verbalization: Conversation Repetition: No impairment(5/5) Naming: No impairment Responsive: (4/5) Confrontation: Within functional limits(10/10) Pragmatics: No impairment Written Expression Dominant Hand: Right   Oral / Motor  Oral Motor/Sensory Function Overall Oral Motor/Sensory Function: Within functional limits Motor Speech Overall Motor Speech: Appears within functional limits for tasks assessed Respiration: Within functional limits Phonation: Normal Resonance: Within functional limits Articulation: Within functional limitis Intelligibility: Intelligible Motor Planning: Witnin functional limits Motor Speech Errors: Not applicable   Juley Giovanetti I. Hardin Negus, [place], [place] Office number [phone] Pager [place] 08/01/2018, 9:39 AM

## 2018-08-02 ENCOUNTER — Encounter (HOSPITAL_COMMUNITY): Payer: Self-pay | Admitting: *Deleted

## 2018-08-02 ENCOUNTER — Inpatient Hospital Stay (HOSPITAL_COMMUNITY): Payer: BC Managed Care – PPO

## 2018-08-02 HISTORY — PX: IR ANGIO INTRA EXTRACRAN SEL COM CAROTID INNOMINATE BILAT MOD SED: IMG5360

## 2018-08-02 HISTORY — PX: IR ANGIO VERTEBRAL SEL VERTEBRAL BILAT MOD SED: IMG5369

## 2018-08-02 LAB — RAPID URINE DRUG SCREEN, HOSP PERFORMED
Amphetamines: NOT DETECTED
Barbiturates: NOT DETECTED
Benzodiazepines: POSITIVE — AB
Cocaine: NOT DETECTED
Opiates: POSITIVE — AB
Tetrahydrocannabinol: POSITIVE — AB

## 2018-08-02 LAB — BASIC METABOLIC PANEL
Anion gap: 11 (ref 5–15)
BUN: 14 mg/dL (ref 6–20)
CO2: 23 mmol/L (ref 22–32)
Calcium: 9.4 mg/dL (ref 8.9–10.3)
Chloride: 94 mmol/L — ABNORMAL LOW (ref 98–111)
Creatinine, Ser: 1 mg/dL (ref 0.61–1.24)
GFR calc Af Amer: >60 mL/min (ref >60)
GFR calc non Af Amer: >60 mL/min (ref >60)
Glucose, Bld: 106 mg/dL — ABNORMAL HIGH (ref 70–99)
Potassium: 3.8 mmol/L (ref 3.5–5.1)
Sodium: 128 mmol/L — ABNORMAL LOW (ref 135–145)

## 2018-08-02 LAB — CBC
HCT: 41.3 % (ref 39.0–52.0)
Hemoglobin: 14.6 g/dL (ref 13.0–17.0)
MCH: 32.7 pg (ref 26.0–34.0)
MCHC: 35.4 g/dL (ref 30.0–36.0)
MCV: 92.4 fL (ref 80.0–100.0)
Platelets: 250 10*3/uL (ref 150–400)
RBC: 4.47 MIL/uL (ref 4.22–5.81)
RDW: 12 % (ref 11.5–15.5)
WBC: 7.9 10*3/uL (ref 4.0–10.5)
nRBC: 0 % (ref 0.0–0.2)

## 2018-08-02 LAB — OSMOLALITY, URINE: Osmolality, Ur: 593 mOsm/kg (ref 300–900)

## 2018-08-02 LAB — SURGICAL PCR SCREEN
MRSA, PCR: NEGATIVE
Staphylococcus aureus: NEGATIVE

## 2018-08-02 LAB — PROTIME-INR
INR: 1 (ref 0.8–1.2)
Prothrombin Time: 12.8 seconds (ref 11.4–15.2)

## 2018-08-02 LAB — NA AND K (SODIUM & POTASSIUM), RAND UR
Potassium Urine: 44 mmol/L
Sodium, Ur: 67 mmol/L

## 2018-08-02 LAB — OSMOLALITY: Osmolality: 276 mOsm/kg (ref 275–295)

## 2018-08-02 MED ORDER — FENTANYL CITRATE (PF) 100 MCG/2ML IJ SOLN
INTRAMUSCULAR | Status: AC
  Filled 2018-08-02: qty 4

## 2018-08-02 MED ORDER — HEPARIN SODIUM (PORCINE) 1000 UNIT/ML IJ SOLN
INTRAMUSCULAR | Status: AC | PRN
  Administered 2018-08-02: 1000 [IU] via INTRAVENOUS

## 2018-08-02 MED ORDER — AMLODIPINE BESYLATE 5 MG PO TABS
5.0000 mg | ORAL_TABLET | Freq: Every day | ORAL | Status: DC
  Administered 2018-08-02: 5 mg via ORAL
  Filled 2018-08-02 (×2): qty 1

## 2018-08-02 MED ORDER — LIDOCAINE HCL 1 % IJ SOLN
INTRAMUSCULAR | Status: AC
  Filled 2018-08-02: qty 20

## 2018-08-02 MED ORDER — SODIUM CHLORIDE 0.9 % IV SOLN
INTRAVENOUS | Status: AC
  Administered 2018-08-02: 16:00:00 via INTRAVENOUS

## 2018-08-02 MED ORDER — HEPARIN SODIUM (PORCINE) 1000 UNIT/ML IJ SOLN
INTRAMUSCULAR | Status: AC
  Filled 2018-08-02: qty 1

## 2018-08-02 MED ORDER — IOHEXOL 300 MG/ML  SOLN
150.0000 mL | Freq: Once | INTRAMUSCULAR | Status: AC | PRN
  Administered 2018-08-02: 96 mL via INTRAVENOUS

## 2018-08-02 MED ORDER — SODIUM CHLORIDE 0.9 % IV SOLN
INTRAVENOUS | Status: AC | PRN
  Administered 2018-08-02: 10 mL/h via INTRAVENOUS

## 2018-08-02 MED ORDER — MIDAZOLAM HCL 2 MG/2ML IJ SOLN
INTRAMUSCULAR | Status: AC
  Filled 2018-08-02: qty 4

## 2018-08-02 MED ORDER — CLONIDINE HCL 0.1 MG PO TABS
0.2000 mg | ORAL_TABLET | Freq: Once | ORAL | Status: AC
  Administered 2018-08-02: 0.2 mg via ORAL
  Filled 2018-08-02: qty 2

## 2018-08-02 MED ORDER — FENTANYL CITRATE (PF) 100 MCG/2ML IJ SOLN
INTRAMUSCULAR | Status: AC | PRN
  Administered 2018-08-02 (×2): 25 ug via INTRAVENOUS

## 2018-08-02 MED ORDER — MIDAZOLAM HCL 2 MG/2ML IJ SOLN
INTRAMUSCULAR | Status: AC | PRN
  Administered 2018-08-02 (×2): 1 mg via INTRAVENOUS

## 2018-08-02 MED ORDER — LIDOCAINE HCL (PF) 1 % IJ SOLN
INTRAMUSCULAR | Status: AC | PRN
  Administered 2018-08-02: 15 mL
  Administered 2018-08-02: 10 mL

## 2018-08-02 MED ORDER — HYDRALAZINE HCL 20 MG/ML IJ SOLN
INTRAMUSCULAR | Status: AC | PRN
  Administered 2018-08-02: 5 mg via INTRAVENOUS

## 2018-08-02 MED ORDER — MUPIROCIN 2 % EX OINT
1.0000 "application " | TOPICAL_OINTMENT | Freq: Two times a day (BID) | CUTANEOUS | Status: DC
  Administered 2018-08-02 (×2): 1 via NASAL
  Filled 2018-08-02: qty 22

## 2018-08-02 MED ORDER — HYDRALAZINE HCL 20 MG/ML IJ SOLN
INTRAMUSCULAR | Status: AC
  Filled 2018-08-02: qty 1

## 2018-08-02 NOTE — Progress Notes (Addendum)
STROKE TEAM PROGRESS NOTE   INTERVAL HISTORY Patient states that he has continued to remain at his baseline with no further neurological deficits. He underwent cerebral angiogram today with showed bilaterally occluded vertebral arteries just distal to PICAs and approximately 65% stenosis of proximal Rt ICA. Flat time until 1400, pulses/sensation intact.   Vitals:   08/02/18 1300 08/02/18 1400 08/02/18 1500 08/02/18 1502  BP: (!) 184/96 (!) 178/80 (!) 195/99 (!) 185/97  Pulse: 62 66 69 65  Resp: 12 16 18    Temp: 97.8 F (36.6 C) 97.8 F (36.6 C) 98.6 F (37 C)   TempSrc: Oral Oral Oral   SpO2: 98% 98% 99% 100%  Weight:      Height:        CBC:  Recent Labs  Lab 07/31/18 1104 07/31/18 2211 08/02/18 0537  WBC 8.4 9.1 7.9  NEUTROABS 6.5  --   --   HGB 15.0 15.1 14.6  HCT 44.3 43.0 41.3  MCV 95.7 93.3 92.4  PLT 262 261 725    Basic Metabolic Panel:  Recent Labs  Lab 07/31/18 1104 07/31/18 2211 08/02/18 0537  NA 129*  --  128*  K 3.8  --  3.8  CL 95*  --  94*  CO2 23  --  23  GLUCOSE 126*  --  106*  BUN 13  --  14  CREATININE 0.83 1.01 1.00  CALCIUM 9.7  --  9.4   Lipid Panel:     Component Value Date/Time   CHOL 194 08/01/2018 0351   TRIG 98 08/01/2018 0351   HDL 73 08/01/2018 0351   CHOLHDL 2.7 08/01/2018 0351   VLDL 20 08/01/2018 0351   LDLCALC 101 (H) 08/01/2018 0351   HgbA1c:  Lab Results  Component Value Date   HGBA1C 5.4 08/01/2018   Urine Drug Screen:     Component Value Date/Time   LABOPIA POSITIVE (A) 08/02/2018 0613   COCAINSCRNUR NONE DETECTED 08/02/2018 0613   LABBENZ POSITIVE (A) 08/02/2018 0613   AMPHETMU NONE DETECTED 08/02/2018 0613   THCU POSITIVE (A) 08/02/2018 0613   LABBARB NONE DETECTED 08/02/2018 0613    Alcohol Level No results found for: ETH  IMAGING Ct Angio Head W Or Wo Contrast  Result Date: 08/01/2018 CLINICAL DATA:  Follow-up examination for acute stroke. EXAM: CT ANGIOGRAPHY HEAD AND NECK TECHNIQUE: Multidetector  CT imaging of the head and neck was performed using the standard protocol during bolus administration of intravenous contrast. Multiplanar CT image reconstructions and MIPs were obtained to evaluate the vascular anatomy. Carotid stenosis measurements (when applicable) are obtained utilizing NASCET criteria, using the distal internal carotid diameter as the denominator. CONTRAST:  40mL OMNIPAQUE IOHEXOL 350 MG/ML SOLN COMPARISON:  Prior MRI and MRA from 07/31/2018 FINDINGS: CTA NECK FINDINGS Aortic arch: Visualized aortic arch of normal caliber with normal 3 vessel morphology. Scattered atheromatous plaque about the arch and origin of the great vessels. Resultant short-segment stenosis of up to approximately 65% at the origin of the left subclavian artery. Short-segment approximate 40% stenosis at the origin of the right subclavian artery. Visualized subclavian arteries otherwise widely patent. No other hemodynamically significant stenosis about the origin of the great vessels. Right carotid system: Right CCA mildly tortuous proximally. Scattered atheromatous plaque within the right common carotid artery without hemodynamically significant stenosis. Bulky calcified plaque about the right bifurcation/proximal right ICA. Associated stenosis of up to 70% by NASCET criteria at the proximal right ICA. Area of involvement begins at the bifurcation and measures approximately  2.3 cm in length. Distally, right ICA patent to the skull base without additional stenosis, dissection, or occlusion. Focal tortuosity noted within the distal cervical right ICA just prior to the skull base. Left carotid system: Scattered atheromatous plaque seen throughout the left CCA with up to approximately 40-50% stenosis. Left CCA remains patent to the bifurcation. Multifocal atheromatous plaque about the left bifurcation/proximal left ICA r with resultant stenosis of up to 65% by NASCET criteria. Left ICA patent distally to the skull base without  dissection or occlusion. Additional calcified atheromatous plaque about the distal left ICA at the level of the skull base with up to approximately 60% stenosis by NASCET criteria (series 7, image 140). Vertebral arteries: Both vertebral arteries arise from the subclavian arteries. Vertebral arteries largely co dominant. On the left, focal plaque seen within the left V2 segment at the level of C5 with associated moderate to severe stenosis of up to approximately 60-70% (series 7, image 239). Left vertebral artery otherwise widely patent to the skull base. On the right, focal plaque at the origin of the right vertebral artery with associated stenosis of up to approximately 50%. Distally, there is focal plaque involving the distal right V2 segment at the level of C3 with associated short-segment moderate to severe stenosis of up to approximately 70% (series 7, image 210). Right vertebral artery otherwise patent within the neck. Attenuated flow seen within the distal vertebral arteries bilaterally related to downstream severe bilateral V4 stenoses. Skeleton: Subtle transverse linear lucency seen extending through the C2 vertebral body, favored to reflect a nutrient foramen (series 9, image 102). No associated prevertebral edema. No other acute osseous abnormality. No discrete lytic or blastic osseous lesions. Mild cervical spondylolysis at C5-6 and C6-7. Other neck: No other acute soft tissue abnormality within the neck. Upper chest: Visualized upper chest demonstrates no acute finding. Upper lobe predominant centrilobular and paraseptal emphysema. Review of the MIP images confirms the above findings CTA HEAD FINDINGS Anterior circulation: Extensive atheromatous plaque throughout the petrous, cavernous, and supraclinoid segments bilaterally with associated moderate to severe multifocal stenoses. ICAs are patent to the termini. A1 segments patent bilaterally. Duplicated versus fenestrated anterior communicating artery.  Anterior cerebral arteries patent to their distal aspects without hemodynamically significant stenosis. No M1 stenosis or occlusion. Normal MCA bifurcations. Distal MCA branches well perfused and symmetric. Posterior circulation: Vertebral arteries patent as they cross into the cranial vault. Bulky calcified plaque seen involving the proximal left V4 segment with secondary severe occlusive/pre occlusive stenosis (series 7, image 152). Faint markedly attenuated flow seen distally, with essentially no flow within the distal left V4 segment as it reaches the vertebrobasilar junction. Left PICA is perfused and patent. On the right, additional focal atheromatous plaque with severe stenosis within the proximal right V4 segment just prior to the takeoff of the right PICA (series 7, image 144). Markedly attenuated flow within the right V4 segment distally, with a centrally absent flow by the time of the vertebrobasilar junction. Right PICA is grossly perfused proximally. No significant flow seen within the proximal and mid basilar artery. Distal reconstitution at the basilar tip, likely via collateralization from small bilateral posterior communicating arteries. Some back flow into the basilar artery proximally with perfusion of the superior cerebral arteries bilaterally. PCAs remain patent and are well perfused to their distal aspects. Venous sinuses: Grossly patent, although not well assessed due to timing of the contrast bolus. Anatomic variants: None significant. Review of the MIP images confirms the above findings IMPRESSION: 1. Focal  severe bilateral V4 stenoses, with occlusion of the vertebral arteries and basilar artery distally. Basilar tip is perfused via small bilateral posterior communicating arteries, with perfusion of the bilateral PCAs and SCAs. 2. Atheromatous stenoses about the carotid bifurcations/proximal ICAs bilaterally, with up to 70% narrowing on the right and 65% narrowing on the left. Additional  approximate 60% stenosis involving the distal left ICA at the cervical-petrous junction. 3. Severe atheromatous plaque throughout the petrous, cavernous, and supraclinoid ICAs bilaterally with associated moderate to severe multifocal stenoses. 4. Scattered atheromatous plaque within the left CCA with up to 40-50% stenosis. 5. Approximate 50% stenosis at the origin of the right vertebral artery, with additional 60-70% stenoses involving the bilateral V2 segments downstream as above. 6. 65% atheromatous stenosis at the origin of the left subclavian artery. 7. Faint linear lucency extending through the C2 vertebral body, favored to reflect a benign nutrient foramen. If there is a history of trauma or high clinical suspicion for possible cervical spine injury, then further assessment with dedicated cervical spine CT would be recommended. Results were discussed by telephone at the time of interpretation on 08/01/2018 at 1:29 am with Dr. Amie Portland. Electronically Signed   By: Jeannine Boga M.D.   On: 08/01/2018 02:38   Ct Angio Neck W Or Wo Contrast  Result Date: 08/01/2018 CLINICAL DATA:  Follow-up examination for acute stroke. EXAM: CT ANGIOGRAPHY HEAD AND NECK TECHNIQUE: Multidetector CT imaging of the head and neck was performed using the standard protocol during bolus administration of intravenous contrast. Multiplanar CT image reconstructions and MIPs were obtained to evaluate the vascular anatomy. Carotid stenosis measurements (when applicable) are obtained utilizing NASCET criteria, using the distal internal carotid diameter as the denominator. CONTRAST:  31mL OMNIPAQUE IOHEXOL 350 MG/ML SOLN COMPARISON:  Prior MRI and MRA from 07/31/2018 FINDINGS: CTA NECK FINDINGS Aortic arch: Visualized aortic arch of normal caliber with normal 3 vessel morphology. Scattered atheromatous plaque about the arch and origin of the great vessels. Resultant short-segment stenosis of up to approximately 65% at the origin  of the left subclavian artery. Short-segment approximate 40% stenosis at the origin of the right subclavian artery. Visualized subclavian arteries otherwise widely patent. No other hemodynamically significant stenosis about the origin of the great vessels. Right carotid system: Right CCA mildly tortuous proximally. Scattered atheromatous plaque within the right common carotid artery without hemodynamically significant stenosis. Bulky calcified plaque about the right bifurcation/proximal right ICA. Associated stenosis of up to 70% by NASCET criteria at the proximal right ICA. Area of involvement begins at the bifurcation and measures approximately 2.3 cm in length. Distally, right ICA patent to the skull base without additional stenosis, dissection, or occlusion. Focal tortuosity noted within the distal cervical right ICA just prior to the skull base. Left carotid system: Scattered atheromatous plaque seen throughout the left CCA with up to approximately 40-50% stenosis. Left CCA remains patent to the bifurcation. Multifocal atheromatous plaque about the left bifurcation/proximal left ICA r with resultant stenosis of up to 65% by NASCET criteria. Left ICA patent distally to the skull base without dissection or occlusion. Additional calcified atheromatous plaque about the distal left ICA at the level of the skull base with up to approximately 60% stenosis by NASCET criteria (series 7, image 140). Vertebral arteries: Both vertebral arteries arise from the subclavian arteries. Vertebral arteries largely co dominant. On the left, focal plaque seen within the left V2 segment at the level of C5 with associated moderate to severe stenosis of up to approximately 60-70% (  series 7, image 239). Left vertebral artery otherwise widely patent to the skull base. On the right, focal plaque at the origin of the right vertebral artery with associated stenosis of up to approximately 50%. Distally, there is focal plaque involving the  distal right V2 segment at the level of C3 with associated short-segment moderate to severe stenosis of up to approximately 70% (series 7, image 210). Right vertebral artery otherwise patent within the neck. Attenuated flow seen within the distal vertebral arteries bilaterally related to downstream severe bilateral V4 stenoses. Skeleton: Subtle transverse linear lucency seen extending through the C2 vertebral body, favored to reflect a nutrient foramen (series 9, image 102). No associated prevertebral edema. No other acute osseous abnormality. No discrete lytic or blastic osseous lesions. Mild cervical spondylolysis at C5-6 and C6-7. Other neck: No other acute soft tissue abnormality within the neck. Upper chest: Visualized upper chest demonstrates no acute finding. Upper lobe predominant centrilobular and paraseptal emphysema. Review of the MIP images confirms the above findings CTA HEAD FINDINGS Anterior circulation: Extensive atheromatous plaque throughout the petrous, cavernous, and supraclinoid segments bilaterally with associated moderate to severe multifocal stenoses. ICAs are patent to the termini. A1 segments patent bilaterally. Duplicated versus fenestrated anterior communicating artery. Anterior cerebral arteries patent to their distal aspects without hemodynamically significant stenosis. No M1 stenosis or occlusion. Normal MCA bifurcations. Distal MCA branches well perfused and symmetric. Posterior circulation: Vertebral arteries patent as they cross into the cranial vault. Bulky calcified plaque seen involving the proximal left V4 segment with secondary severe occlusive/pre occlusive stenosis (series 7, image 152). Faint markedly attenuated flow seen distally, with essentially no flow within the distal left V4 segment as it reaches the vertebrobasilar junction. Left PICA is perfused and patent. On the right, additional focal atheromatous plaque with severe stenosis within the proximal right V4 segment  just prior to the takeoff of the right PICA (series 7, image 144). Markedly attenuated flow within the right V4 segment distally, with a centrally absent flow by the time of the vertebrobasilar junction. Right PICA is grossly perfused proximally. No significant flow seen within the proximal and mid basilar artery. Distal reconstitution at the basilar tip, likely via collateralization from small bilateral posterior communicating arteries. Some back flow into the basilar artery proximally with perfusion of the superior cerebral arteries bilaterally. PCAs remain patent and are well perfused to their distal aspects. Venous sinuses: Grossly patent, although not well assessed due to timing of the contrast bolus. Anatomic variants: None significant. Review of the MIP images confirms the above findings IMPRESSION: 1. 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 Basilar tip is perfused via small bilateral posterior communicating arteries, with perfusion of the bilateral PCAs and SCAs. 2. Atheromatous stenoses about the carotid bifurcations/proximal ICAs bilaterally, with up to 70% narrowing on the right and 65% narrowing on the left. Additional approximate 60% stenosis involving the distal left ICA at the cervical-petrous junction. 3. Severe atheromatous plaque throughout the petrous, cavernous, and supraclinoid ICAs bilaterally with associated moderate to severe multifocal stenoses. 4. Scattered atheromatous plaque within the left CCA with up to 40-50% stenosis. 5. Approximate 50% stenosis at the origin of the right vertebral artery, with additional 60-70% stenoses involving the bilateral V2 segments downstream as above. 6. 65% atheromatous stenosis at the origin of the left subclavian artery. 7. Faint linear lucency extending through the C2 vertebral body, favored to reflect a benign nutrient foramen. If there is a history of trauma  or high clinical suspicion for  possible cervical spine injury, then further assessment with dedicated cervical spine CT would be recommended. Results were discussed by telephone at the time of interpretation on 08/01/2018 at 1:29 am with Dr. Amie Portland. Electronically Signed   By: Jeannine Boga M.D.   On: 08/01/2018 02:38    PHYSICAL EXAM  Temp:  [97.5 F (36.4 C)-98.6 F (37 C)] 98.6 F (37 C) (07/31 1500) Pulse Rate:  [61-81] 65 (07/31 1502) Resp:  [12-24] 18 (07/31 1500) BP: (140-205)/(80-121) 185/97 (07/31 1502) SpO2:  [97 %-100 %] 100 % (07/31 1502)  General - Well nourished, well developed, in no apparent distress. Cardiovascular - Regular rate and rhythm. Pulmonary- CTAB, no dyspnea or wheezing noted GI- soft, no TTP  Mental Status -  Level of arousal and orientation to time, place, and person were intact. Language including expression, naming, repetition, comprehension was assessed and found intact. Fund of Knowledge was assessed and was intact.  Cranial Nerves II - XII - II - Visual field intact OU. III, IV, VI - Extraocular movements intact. V - Facial sensation intact bilaterally. VII - Facial movement intact bilaterally. VIII - Hearing & vestibular intact bilaterally. X - Palate elevates symmetrically. XI - Chin turning & shoulder shrug intact bilaterally. XII - Tongue protrusion intact.  Motor Strength - The patient's strength was normal in all extremities and pronator drift was absent.  Bulk was normal and fasciculations were absent.   Motor Tone - Muscle tone was assessed at the neck and appendages and was normal.  Reflexes - The patient's reflexes were symmetrical in all extremities and he had no pathological reflexes.  Sensory - Light touch, temperature/pinprick were assessed and were symmetrical.    Coordination - The patient had normal movements in the hands and feet with no ataxia or dysmetria.  Tremor was absent.  Gait and Station - deferred.  ASSESSMENT/PLAN Mr. MCKADE GURKA is a 58 y.o. 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 Small vessel disease. Atrophy.   MRI  R>L pontine infarcts. Small vessel disease. Atrophy.   MRA  Occlusion B VAs  CTA head & neck B V4 stenoses w/ occlusion distal VA and BA distally. R ICA 70% and L ICA 65% narrowing. Distal L ICA 60% at cervical-petrous jxn. Severe ICA atherosclerosis. L CCA 40-50%. R VA 50%, B V2 60-70%. L subclavian 65%.  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   Dr. Estanislado Pandy not planning on any intervention at this time. Stroke team will sign off.    2D Echo 55-60%, mild LVH + diastolic dysfunction, trivial aortic regurgitation  LDL 101  HgbA1c 5.4  UDS positive for opiates, benzo, and THC  Lovenox 40 mg sq daily for VTE prophylaxis  aspirin 81 mg daily prior to admission, now on aspirin 325 mg daily and clopidogrel 75 mg daily folloing DAPT load. Continue DAPT for 3 months and then plavix alone  Therapy recommendations:  No OT, No SLP  Disposition: home  Intra- and extracranial stenosis  CTA head & neck B V4 stenoses w/ occlusion distal VA and BA distally. R ICA 70% and L ICA 65% narrowing. Distal L ICA 60% at cervical-petrous jxn.  Severe ICA atherosclerosis. L CCA 40-50%. R VA 50%, B V2 60-70%. L subclavian 65%.  Chronic vasculopathy  Cerebral angiogram bilateral vertebral artery occlusion distal to PICAs + proximal R ICA 65% stenosis.  No intervention can be offered this time  Will follow up with Dr. Estanislado Pandy as outpt  Hypertensive Emergency  BP as high as 244/114  Remains elevated this am  Home meds - lisinopril and metoprolol  Resume home meds . Permissive hypertension (OK if <  180/105) but gradually normalize in 3-5 days . Recommend to check orthostatic vitals before d/c . Long-term BP goal 130-160 given severe vascular stenosis  Hyperlipidemia  Home meds:  lipitior 80, resumed in hospital  LDL 101, goal < 70  Continue statin at discharge  Heavy ETOH use + substance use (UDS positive for opiates, benzo, and THC)  drinks 6-pack/day  advised to drink no more than 2 drink(s) a day   on CIWA  On FA/B1/MVI  Other Stroke Risk Factors  Former Cigarette smoker, quit in 2008 + current substance use  Family hx stroke (grandfather)  Coronary artery disease s/p DES 2014  Other Active Problems  Hyponatremia Na 129  Chronic LBP  GERD  Hospital day # 1  Neurology will sign off. Please call with questions. Pt will follow up with stroke clinic NP at [facility] in about 4 weeks. Thanks for the consult.   Rosalin Hawking, MD PhD Stroke Neurology 08/02/2018 3:26 PM      To contact Stroke Continuity provider, please refer to [email or web address] After hours, contact General Neurology

## 2018-08-02 NOTE — Progress Notes (Signed)
Progress Note    [name]  JXB:[id number] DOB: Jun 24, 1960  DOA: 07/31/2018 PCP: [name], MD    Brief Narrative:     Medical records reviewed and are as summarized below:  [name] is an 58 y.o. male with past medical history significant for HTN, HL, daily alcohol use and previous CAD was in his usual state of health until yesterday when he noted sudden onset difficulty walking.  Patient states he had gotten into his car without difficulty however when he tried to get out of his car to go to [place] he had difficulty walking.  "I could not get my legs to do what they needed to do".    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  HYPONATREMIA  -BMP in AM -osoms all normal  HTN -orthostatics to r/o large drop -titrate BP meds to SBP goal of 130-160  ALCOHOL ABUSE -CIWA-- scheduled and PRN  HL Continue atorvastatin -- goal LDL: <70   Family Communication/Anticipated D/C date and plan/Code Status   DVT prophylaxis: Lovenox ordered. Code Status: Full Code.  Family Communication: spoke with wife Disposition Plan: d/c in AM   Medical Consultants:    Neuro  IR   Subjective:   Back from angiogram-- laying flat still  Objective:    Vitals:   08/02/18 1300 08/02/18 1400 08/02/18 1500 08/02/18 1502  BP: (!) 184/96 (!) 178/80 (!) 195/99 (!) 185/97  Pulse: 62 66 69 65  Resp: 12 16 18    Temp: 97.8 F (36.6 C) 97.8 F (36.6 C) 98.6 F (37 C)   TempSrc: Oral Oral Oral   SpO2: 98% 98% 99% 100%  Weight:      Height:        Intake/Output Summary (Last 24 hours) at 08/02/2018 1600 Last  data filed at 08/02/2018 1543 Gross per 24 hour  Intake 920.42 ml  Output --  Net 920.42 ml   Filed Weights   07/31/18 1002 07/31/18 2139  Weight: 75.8 kg 72.6 kg    Exam: Laying flat in bed, NAD rrr No increased work of breathing No LE edmea  Data Reviewed:   I have personally reviewed following labs and imaging studies:  Labs: Labs show the following:   Basic Metabolic Panel: Recent Labs  Lab 07/31/18 1104 07/31/18 2211 08/02/18 0537  NA 129*  --  128*  K 3.8  --  3.8  CL 95*  --  94*  CO2 23  --  23  GLUCOSE 126*  --  106*  BUN 13  --  14  CREATININE 0.83 1.01 1.00  CALCIUM 9.7  --  9.4   GFR Estimated Creatinine Clearance: 84.7 mL/min (by C-G formula based on SCr of 1 mg/dL). Liver Function Tests: Recent Labs  Lab 07/31/18 1104  AST 19  ALT 12  ALKPHOS 44  BILITOT 1.0  PROT 8.4*  ALBUMIN 4.8   No results for input(s): LIPASE, AMYLASE in the last 168 hours. No results for input(s): AMMONIA in the last 168 hours. Coagulation profile Recent Labs  Lab 08/02/18 0537  INR 1.0    CBC: Recent Labs  Lab 07/31/18 1104  07/31/18 2211 08/02/18 0537  WBC 8.4 9.1 7.9  NEUTROABS 6.5  --   --   HGB 15.0 15.1 14.6  HCT 44.3 43.0 41.3  MCV 95.7 93.3 92.4  PLT 262 261 250   Cardiac Enzymes: No results for input(s): CKTOTAL, CKMB, CKMBINDEX, TROPONINI in the last 168 hours. BNP (last 3 results) No results for input(s): PROBNP in the last 8760 hours. CBG: No results for input(s): GLUCAP in the last 168 hours. D-Dimer: No results for input(s): DDIMER in the last 72 hours. Hgb A1c: Recent Labs    08/01/18 0351  HGBA1C 5.4   Lipid Profile: Recent Labs    08/01/18 0351  CHOL 194  HDL 73  LDLCALC 101*  TRIG 98  CHOLHDL 2.7   Thyroid function studies: No results for input(s): TSH, T4TOTAL, T3FREE, THYROIDAB in the last 72 hours.  Invalid input(s): FREET3 Anemia work up: No results for input(s): VITAMINB12, FOLATE, FERRITIN, TIBC, IRON,  RETICCTPCT in the last 72 hours. Sepsis Labs: Recent Labs  Lab 07/31/18 1104 07/31/18 2211 08/02/18 0537  WBC 8.4 9.1 7.9    Microbiology Recent Results (from the past 240 hour(s))  SARS Coronavirus 2 (CEPHEID - Performed in [facility] hospital lab), Hosp Order     Status: None   Collection Time: 07/31/18 11:45 AM   Specimen: Nasopharyngeal Swab  Result Value Ref Range Status   SARS Coronavirus 2 NEGATIVE NEGATIVE Final    Comment: (NOTE) If result is NEGATIVE SARS-CoV-2 target nucleic acids are NOT DETECTED. The SARS-CoV-2 RNA is generally detectable in upper and lower  respiratory specimens during the acute phase of infection. The lowest  concentration of SARS-CoV-2 viral copies this assay can detect is 250  copies / mL. A negative result does not preclude SARS-CoV-2 infection  and should not be used as the sole basis for treatment or other  patient management decisions.  A negative result may occur with  improper specimen collection / handling, submission of specimen other  than nasopharyngeal swab, presence of viral mutation(s) within the  areas targeted by this assay, and inadequate number of viral copies  (<250 copies / mL). A negative result must be combined with clinical  observations, patient history, and epidemiological information. If result is POSITIVE SARS-CoV-2 target nucleic acids are DETECTED. The SARS-CoV-2 RNA is generally detectable in upper and lower  respiratory specimens dur ing the acute phase of infection.  Positive  results are indicative of active infection with SARS-CoV-2.  Clinical  correlation with patient history and other diagnostic information is  necessary to determine patient infection status.  Positive results do  not rule out bacterial infection or co-infection with other viruses. If result is PRESUMPTIVE POSTIVE SARS-CoV-2 nucleic acids MAY BE PRESENT.   A presumptive positive result was obtained on the submitted specimen  and confirmed  on repeat testing.  While 2019 novel coronavirus  (SARS-CoV-2) nucleic acids may be present in the submitted sample  additional confirmatory testing may be necessary for epidemiological  and / or clinical management purposes  to differentiate between  SARS-CoV-2 and other Sarbecovirus currently known to infect humans.  If clinically indicated additional testing with an alternate test  methodology [phone]) is advised. The SARS-CoV-2 RNA is generally  detectable in upper and lower respiratory sp ecimens during the acute  phase of infection. The expected result is Negative. Fact Sheet for Patients:  StrictlyIdeas.no Fact Sheet for Healthcare Providers: BankingDealers.co.za This test is not yet approved or cleared by the [place] FDA and  has been authorized for detection and/or diagnosis of SARS-CoV-2 by FDA under an Emergency Use Authorization (EUA).  This EUA will remain in effect (meaning this test can be used) for the duration of the COVID-19 declaration under Section 564(b)(1) of the Act, 21 U.S.C. section 360bbb-3(b)(1), unless the authorization is terminated or revoked sooner. Performed at [facility], [address] [place], [place] [address]   Surgical PCR screen     Status: None   Collection Time: 08/02/18  4:39 AM   Specimen: Nasal Mucosa; Nasal Swab  Result Value Ref Range Status   MRSA, PCR NEGATIVE NEGATIVE Final   Staphylococcus aureus NEGATIVE NEGATIVE Final    Comment: (NOTE) The Xpert SA Assay (FDA approved for NASAL specimens in patients 74 years of age and older), is one component of a comprehensive surveillance program. It is not intended to diagnose infection nor to guide or monitor treatment. Performed at [facility], [place] [address], [place], [place] [address]     Procedures and diagnostic studies:  Ct Angio Head W Or Wo Contrast  Result Date: 08/01/2018 CLINICAL DATA:  Follow-up examination for  acute stroke. EXAM: CT ANGIOGRAPHY HEAD AND NECK TECHNIQUE: Multidetector CT imaging of the head and neck was performed using the standard protocol during bolus administration of intravenous contrast. Multiplanar CT image reconstructions and MIPs were obtained to evaluate the vascular anatomy. Carotid stenosis measurements (when applicable) are obtained utilizing NASCET criteria, using the distal internal carotid diameter as the denominator. CONTRAST:  28mL OMNIPAQUE IOHEXOL 350 MG/ML SOLN COMPARISON:  Prior MRI and MRA from 07/31/2018 FINDINGS: CTA NECK FINDINGS Aortic arch: Visualized aortic arch of normal caliber with normal 3 vessel morphology. Scattered atheromatous plaque about the arch and origin of the great vessels. Resultant short-segment stenosis of up to approximately 65% at the origin of the left subclavian artery. Short-segment approximate 40% stenosis at the origin of the right subclavian artery. Visualized subclavian arteries otherwise widely patent. No other hemodynamically significant stenosis about the origin of the great vessels. Right carotid system: Right CCA mildly tortuous proximally. Scattered atheromatous plaque within the right common carotid artery without hemodynamically significant stenosis. Bulky calcified plaque about the right bifurcation/proximal right ICA. Associated stenosis of up to 70% by NASCET criteria at the proximal right ICA. Area of involvement begins at the bifurcation and measures approximately 2.3 cm in length. Distally, right ICA patent to the skull base without additional stenosis, dissection, or occlusion. Focal tortuosity noted within the distal cervical right ICA just prior to the skull base. Left carotid system: Scattered atheromatous plaque seen throughout the left CCA with up to approximately 40-50% stenosis. Left CCA remains patent to the bifurcation. Multifocal atheromatous plaque about the left bifurcation/proximal left ICA r with resultant stenosis of up to  65% by NASCET criteria. Left ICA patent distally to the skull base without dissection or occlusion. Additional calcified atheromatous plaque about the distal left ICA at the level of the skull base with up to approximately 60% stenosis by NASCET criteria (series 7, image 140). Vertebral arteries: Both vertebral arteries arise from the subclavian arteries. Vertebral arteries largely co dominant. On the left, focal plaque seen within the left V2 segment at the level of C5 with associated moderate to severe stenosis of up to approximately 60-70% (series 7, image 239). Left vertebral artery otherwise widely patent to the skull base. On the right, focal plaque at the origin of the right vertebral artery with associated stenosis of up to approximately 50%. Distally, there is  focal plaque involving the distal right V2 segment at the level of C3 with associated short-segment moderate to severe stenosis of up to approximately 70% (series 7, image 210). Right vertebral artery otherwise patent within the neck. Attenuated flow seen within the distal vertebral arteries bilaterally related to downstream severe bilateral V4 stenoses. Skeleton: Subtle transverse linear lucency seen extending through the C2 vertebral body, favored to reflect a nutrient foramen (series 9, image 102). No associated prevertebral edema. No other acute osseous abnormality. No discrete lytic or blastic osseous lesions. Mild cervical spondylolysis at C5-6 and C6-7. Other neck: No other acute soft tissue abnormality within the neck. Upper chest: Visualized upper chest demonstrates no acute finding. Upper lobe predominant centrilobular and paraseptal emphysema. Review of the MIP images confirms the above findings CTA HEAD FINDINGS Anterior circulation: Extensive atheromatous plaque throughout the petrous, cavernous, and supraclinoid segments bilaterally with associated moderate to severe multifocal stenoses. ICAs are patent to the termini. A1 segments patent  bilaterally. Duplicated versus fenestrated anterior communicating artery. Anterior cerebral arteries patent to their distal aspects without hemodynamically significant stenosis. No M1 stenosis or occlusion. Normal MCA bifurcations. Distal MCA branches well perfused and symmetric. Posterior circulation: Vertebral arteries patent as they cross into the cranial vault. Bulky calcified plaque seen involving the proximal left V4 segment with secondary severe occlusive/pre occlusive stenosis (series 7, image 152). Faint markedly attenuated flow seen distally, with essentially no flow within the distal left V4 segment as it reaches the vertebrobasilar junction. Left PICA is perfused and patent. On the right, additional focal atheromatous plaque with severe stenosis within the proximal right V4 segment just prior to the takeoff of the right PICA (series 7, image 144). Markedly attenuated flow within the right V4 segment distally, with a centrally absent flow by the time of the vertebrobasilar junction. Right PICA is grossly perfused proximally. No significant flow seen within the proximal and mid basilar artery. Distal reconstitution at the basilar tip, likely via collateralization from small bilateral posterior communicating arteries. Some back flow into the basilar artery proximally with perfusion of the superior cerebral arteries bilaterally. PCAs remain patent and are well perfused to their distal aspects. Venous sinuses: Grossly patent, although not well assessed due to timing of the contrast bolus. Anatomic variants: None significant. Review of the MIP images confirms the above findings IMPRESSION: 1. 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 Basilar tip is perfused via small bilateral posterior communicating arteries, with perfusion of the bilateral PCAs and SCAs. 2. Atheromatous stenoses about the carotid bifurcations/proximal ICAs bilaterally, with up  to 70% narrowing on the right and 65% narrowing on the left. Additional approximate 60% stenosis involving the distal left ICA at the cervical-petrous junction. 3. Severe atheromatous plaque throughout the petrous, cavernous, and supraclinoid ICAs bilaterally with associated moderate to severe multifocal stenoses. 4. Scattered atheromatous plaque within the left CCA with up to 40-50% stenosis. 5. Approximate 50% stenosis at the origin of the right vertebral artery, with additional 60-70% stenoses involving the bilateral V2 segments downstream as above. 6. 65% atheromatous stenosis at the origin of the left subclavian artery. 7. Faint linear lucency extending through the C2 vertebral body, favored to reflect a benign nutrient foramen. If there is a history of trauma or high clinical suspicion for possible cervical spine injury, then further assessment with dedicated cervical spine CT would be recommended. Results were discussed by telephone at the time of interpretation on 08/01/2018 at 1:29 am with Dr. Weldon Picking  [facility]. Electronically Signed   By: Jeannine Boga M.D.   On: 08/01/2018 02:38   Ct Angio Neck W Or Wo Contrast  Result Date: 08/01/2018 CLINICAL DATA:  Follow-up examination for acute stroke. EXAM: CT ANGIOGRAPHY HEAD AND NECK TECHNIQUE: Multidetector CT imaging of the head and neck was performed using the standard protocol during bolus administration of intravenous contrast. Multiplanar CT image reconstructions and MIPs were obtained to evaluate the vascular anatomy. Carotid stenosis measurements (when applicable) are obtained utilizing NASCET criteria, using the distal internal carotid diameter as the denominator. CONTRAST:  71mL OMNIPAQUE IOHEXOL 350 MG/ML SOLN COMPARISON:  Prior MRI and MRA from 07/31/2018 FINDINGS: CTA NECK FINDINGS Aortic arch: Visualized aortic arch of normal caliber with normal 3 vessel morphology. Scattered atheromatous plaque about the arch and origin of the great vessels.  Resultant short-segment stenosis of up to approximately 65% at the origin of the left subclavian artery. Short-segment approximate 40% stenosis at the origin of the right subclavian artery. Visualized subclavian arteries otherwise widely patent. No other hemodynamically significant stenosis about the origin of the great vessels. Right carotid system: Right CCA mildly tortuous proximally. Scattered atheromatous plaque within the right common carotid artery without hemodynamically significant stenosis. Bulky calcified plaque about the right bifurcation/proximal right ICA. Associated stenosis of up to 70% by NASCET criteria at the proximal right ICA. Area of involvement begins at the bifurcation and measures approximately 2.3 cm in length. Distally, right ICA patent to the skull base without additional stenosis, dissection, or occlusion. Focal tortuosity noted within the distal cervical right ICA just prior to the skull base. Left carotid system: Scattered atheromatous plaque seen throughout the left CCA with up to approximately 40-50% stenosis. Left CCA remains patent to the bifurcation. Multifocal atheromatous plaque about the left bifurcation/proximal left ICA r with resultant stenosis of up to 65% by NASCET criteria. Left ICA patent distally to the skull base without dissection or occlusion. Additional calcified atheromatous plaque about the distal left ICA at the level of the skull base with up to approximately 60% stenosis by NASCET criteria (series 7, image 140). Vertebral arteries: Both vertebral arteries arise from the subclavian arteries. Vertebral arteries largely co dominant. On the left, focal plaque seen within the left V2 segment at the level of C5 with associated moderate to severe stenosis of up to approximately 60-70% (series 7, image 239). Left vertebral artery otherwise widely patent to the skull base. On the right, focal plaque at the origin of the right vertebral artery with associated stenosis of  up to approximately 50%. Distally, there is focal plaque involving the distal right V2 segment at the level of C3 with associated short-segment moderate to severe stenosis of up to approximately 70% (series 7, image 210). Right vertebral artery otherwise patent within the neck. Attenuated flow seen within the distal vertebral arteries bilaterally related to downstream severe bilateral V4 stenoses. Skeleton: Subtle transverse linear lucency seen extending through the C2 vertebral body, favored to reflect a nutrient foramen (series 9, image 102). No associated prevertebral edema. No other acute osseous abnormality. No discrete lytic or blastic osseous lesions. Mild cervical spondylolysis at C5-6 and C6-7. Other neck: No other acute soft tissue abnormality within the neck. Upper chest: Visualized upper chest demonstrates no acute finding. Upper lobe predominant centrilobular and paraseptal emphysema. Review of the MIP images confirms the above findings CTA HEAD FINDINGS Anterior circulation: Extensive atheromatous plaque throughout the petrous, cavernous, and supraclinoid segments bilaterally with associated moderate to severe multifocal stenoses. ICAs are patent  to the termini. A1 segments patent bilaterally. Duplicated versus fenestrated anterior communicating artery. Anterior cerebral arteries patent to their distal aspects without hemodynamically significant stenosis. No M1 stenosis or occlusion. Normal MCA bifurcations. Distal MCA branches well perfused and symmetric. Posterior circulation: Vertebral arteries patent as they cross into the cranial vault. Bulky calcified plaque seen involving the proximal left V4 segment with secondary severe occlusive/pre occlusive stenosis (series 7, image 152). Faint markedly attenuated flow seen distally, with essentially no flow within the distal left V4 segment as it reaches the vertebrobasilar junction. Left PICA is perfused and patent. On the right, additional focal  atheromatous plaque with severe stenosis within the proximal right V4 segment just prior to the takeoff of the right PICA (series 7, image 144). Markedly attenuated flow within the right V4 segment distally, with a centrally absent flow by the time of the vertebrobasilar junction. Right PICA is grossly perfused proximally. No significant flow seen within the proximal and mid basilar artery. Distal reconstitution at the basilar tip, likely via collateralization from small bilateral posterior communicating arteries. Some back flow into the basilar artery proximally with perfusion of the superior cerebral arteries bilaterally. PCAs remain patent and are well perfused to their distal aspects. Venous sinuses: Grossly patent, although not well assessed due to timing of the contrast bolus. Anatomic variants: None significant. Review of the MIP images confirms the above findings IMPRESSION: 1. 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 Basilar tip is perfused via small bilateral posterior communicating arteries, with perfusion of the bilateral PCAs and SCAs. 2. Atheromatous stenoses about the carotid bifurcations/proximal ICAs bilaterally, with up to 70% narrowing on the right and 65% narrowing on the left. Additional approximate 60% stenosis involving the distal left ICA at the cervical-petrous junction. 3. Severe atheromatous plaque throughout the petrous, cavernous, and supraclinoid ICAs bilaterally with associated moderate to severe multifocal stenoses. 4. Scattered atheromatous plaque within the left CCA with up to 40-50% stenosis. 5. Approximate 50% stenosis at the origin of the right vertebral artery, with additional 60-70% stenoses involving the bilateral V2 segments downstream as above. 6. 65% atheromatous stenosis at the origin of the left subclavian artery. 7. Faint linear lucency extending through the C2 vertebral body, favored to reflect a benign  nutrient foramen. If there is a history of trauma or high clinical suspicion for possible cervical spine injury, then further assessment with dedicated cervical spine CT would be recommended. Results were discussed by telephone at the time of interpretation on 08/01/2018 at 1:29 am with Dr. Amie [place]. Electronically Signed   By: Jeannine Boga M.D.   On: 08/01/2018 02:38    Medications:    amLODipine  5 mg Oral Daily   aspirin  325 mg Oral Daily   atorvastatin  80 mg Oral QPM   clopidogrel  75 mg Oral Daily   enoxaparin (LOVENOX) injection  40 mg Subcutaneous Q24H   fentaNYL       folic acid  1 mg Oral Daily   heparin       hydrALAZINE       lidocaine       lisinopril  20 mg Oral BID   LORazepam  0-4 mg Oral Q6H   Followed by   LORazepam  0-4 mg Oral Q12H   metoprolol tartrate  12.5 mg Oral BID   midazolam       multivitamin with minerals  1 tablet Oral Daily   mupirocin ointment  1 application  Nasal BID   pantoprazole  40 mg Oral Daily   thiamine  100 mg Oral Daily   Or   thiamine  100 mg Intravenous Daily   Continuous Infusions:  sodium chloride 75 mL/hr at 08/02/18 1543     LOS: 1 day   Geradine Girt  [facility]   How to contact the [facility] Attending or Consulting provider [place] or covering provider during after hours [place], for this patient?  1. Check the care team in [facility] and look for a) attending/consulting [facility] provider listed and b) the [facility] team listed 2. Log into [email or web address] and use [place]'s universal password to access. If you do not have the password, please contact the hospital operator. 3. Locate the [facility] provider you are looking for under [facility] and page to a number that you can be directly reached. 4. If you still have difficulty reaching the provider, please page the [facility] (Director on Call) for the Hospitalists listed on amion for assistance.  08/02/2018, 4:00 PM

## 2018-08-02 NOTE — Procedures (Signed)
S/P 4 vessel cerebral arteriogram RT CFA approach . Findings. 1.Bilaterally occluded co dominant  VBJs just distal to PICAS. 2.Approx 65 % stenosis of prox RT ICA. S.Schuyler Behan MD

## 2018-08-02 NOTE — Progress Notes (Signed)
CSW provided substance abuse resources to the patient. He stated that he had heard it all before. CSW handed over the resources and thanked him for his time.   CSW is signing off for now. Please re-consult if needed.   Domenic Schwab, MSW, [place] Worker [facility]  [phone]

## 2018-08-02 NOTE — Progress Notes (Signed)
Wife called for an update.  Informed her that he is off the unit in his revasc procedure.

## 2018-08-03 LAB — BASIC METABOLIC PANEL
Anion gap: 10 (ref 5–15)
BUN: 14 mg/dL (ref 6–20)
CO2: 23 mmol/L (ref 22–32)
Calcium: 9.2 mg/dL (ref 8.9–10.3)
Chloride: 95 mmol/L — ABNORMAL LOW (ref 98–111)
Creatinine, Ser: 0.99 mg/dL (ref 0.61–1.24)
GFR calc Af Amer: >60 mL/min (ref >60)
GFR calc non Af Amer: >60 mL/min (ref >60)
Glucose, Bld: 105 mg/dL — ABNORMAL HIGH (ref 70–99)
Potassium: 3.8 mmol/L (ref 3.5–5.1)
Sodium: 128 mmol/L — ABNORMAL LOW (ref 135–145)

## 2018-08-03 MED ORDER — CLOPIDOGREL BISULFATE 75 MG PO TABS: 75.0000 mg | ORAL_TABLET | Freq: Every day | ORAL | 1 refills | Status: DC

## 2018-08-03 MED ORDER — ASPIRIN 325 MG PO TABS: 325.0000 mg | ORAL_TABLET | Freq: Every day | ORAL | Status: AC

## 2018-08-03 MED ORDER — LISINOPRIL 20 MG PO TABS: 20.0000 mg | ORAL_TABLET | Freq: Every day | ORAL | 0 refills | Status: DC

## 2018-08-03 MED ORDER — AMLODIPINE BESYLATE 5 MG PO TABS: 5.0000 mg | ORAL_TABLET | Freq: Every day | ORAL | 1 refills | Status: DC

## 2018-08-03 NOTE — Discharge Summary (Signed)
Physician Discharge Summary  [name]:[id number] DOB: 1960/09/19 DOA: 07/31/2018  PCP: [name], MD  Admit date: 07/31/2018 Discharge date: 08/03/2018  Admitted From: home Discharge disposition: home   Recommendations for Outpatient Follow-Up:   BMP 1 week BP check-- -- may need to reduce meds-- goal is to avoid hypotension-- SBP 130-150 ASA/plavix for 3 months then plavix only  Discharge Diagnosis:   Principal Problem:   Stroke [facility]) Active Problems:   Hypertension   Alcohol abuse, daily use   Hyperlipidemia   Coronary artery disease    Discharge Condition: Improved.  Diet recommendation: Low sodium, heart healthy  Wound care: None.  Code status: Full.   History of Present Illness:   [name] is an 58 y.o. male with past medical history significant for HTN, HL, daily alcohol use and previous CAD was in his usual state of health until yesterday when he noted sudden onset difficulty walking.  Patient states he had gotten into his car without difficulty however when he tried to get out of his car to go to [place] he had difficulty walking.  "I could not get my legs to do what they needed to do".  He states he was glad he had a cart to lean on and was able to complete his shopping at [place].  He was subsequently able to get into his car and drive to [employer] where he again had difficulty walking and needed to have a cart to rely on.  Patient presented to the ED today because he continued to have difficulty walking.  He also noted he had difficulty with his vision although he is unable to state exactly what that was.  He thinks that he may have had blurry vision but was unsure.  Does not think he had double vision.  He notes this is now resolved.  Patient notes his last drink was yesterday.  He drinks 6-12 beers a day.  He has never had the DTs as far as he knows however does admit he has the shakes when he quits drinking.  Patient denies any fevers  or chills.  No known exposure to COVID.  No cough or shortness of breath.  No acute intercurrent illness.  No chest pain.   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  HYPONATREMIA -osoms all normal  HTN -BP improved but will need wean down medications to avoid hypotension, goal of 130-150  ALCOHOL ABUSE -CIWA-- scheduled and PRN  HLD -statin  Alcohol abuse -encourage cessation    Medical Consultants:    neurology  Discharge Exam:   Vitals:   08/03/18 0408 08/03/18 0746  BP: (!) 147/87 (!) 149/90  Pulse: (!) 58 67  Resp: 18 17  Temp: 98 F (36.7 C) 98.2 F (36.8 C)  SpO2: 98% 98%   Vitals:   08/02/18 1919 08/02/18 2313 08/03/18 0408 08/03/18 0746  BP: (!) 145/86 (!) 186/99 (!) 147/87 (!) 149/90  Pulse: 75 (!) 58 (!) 58 67  Resp: 16 18 18 17   Temp: 98.5 F (36.9 C) 98.4 F (36.9 C) 98 F (36.7 C) 98.2 F (36.8 C)  TempSrc: Oral Oral Oral Oral  SpO2: 98% 97% 98% 98%  Weight:      Height:  General exam: Appears calm and comfortable.   The results of significant diagnostics from this hospitalization (including imaging, microbiology, ancillary and laboratory) are listed below for reference.     Procedures and Diagnostic Studies:   Ct Angio Head W Or Wo Contrast  Result Date: 08/01/2018 CLINICAL DATA:  Follow-up examination for acute stroke. EXAM: CT ANGIOGRAPHY HEAD AND NECK TECHNIQUE: Multidetector CT imaging of the head and neck was performed using the standard protocol during bolus administration of intravenous contrast. Multiplanar CT image reconstructions and MIPs were obtained to evaluate the vascular anatomy. Carotid stenosis measurements (when applicable) are obtained utilizing NASCET criteria, using the distal  internal carotid diameter as the denominator. CONTRAST:  48mL OMNIPAQUE IOHEXOL 350 MG/ML SOLN COMPARISON:  Prior MRI and MRA from 07/31/2018 FINDINGS: CTA NECK FINDINGS Aortic arch: Visualized aortic arch of normal caliber with normal 3 vessel morphology. Scattered atheromatous plaque about the arch and origin of the great vessels. Resultant short-segment stenosis of up to approximately 65% at the origin of the left subclavian artery. Short-segment approximate 40% stenosis at the origin of the right subclavian artery. Visualized subclavian arteries otherwise widely patent. No other hemodynamically significant stenosis about the origin of the great vessels. Right carotid system: Right CCA mildly tortuous proximally. Scattered atheromatous plaque within the right common carotid artery without hemodynamically significant stenosis. Bulky calcified plaque about the right bifurcation/proximal right ICA. Associated stenosis of up to 70% by NASCET criteria at the proximal right ICA. Area of involvement begins at the bifurcation and measures approximately 2.3 cm in length. Distally, right ICA patent to the skull base without additional stenosis, dissection, or occlusion. Focal tortuosity noted within the distal cervical right ICA just prior to the skull base. Left carotid system: Scattered atheromatous plaque seen throughout the left CCA with up to approximately 40-50% stenosis. Left CCA remains patent to the bifurcation. Multifocal atheromatous plaque about the left bifurcation/proximal left ICA r with resultant stenosis of up to 65% by NASCET criteria. Left ICA patent distally to the skull base without dissection or occlusion. Additional calcified atheromatous plaque about the distal left ICA at the level of the skull base with up to approximately 60% stenosis by NASCET criteria (series 7, image 140). Vertebral arteries: Both vertebral arteries arise from the subclavian arteries. Vertebral arteries largely co dominant. On  the left, focal plaque seen within the left V2 segment at the level of C5 with associated moderate to severe stenosis of up to approximately 60-70% (series 7, image 239). Left vertebral artery otherwise widely patent to the skull base. On the right, focal plaque at the origin of the right vertebral artery with associated stenosis of up to approximately 50%. Distally, there is focal plaque involving the distal right V2 segment at the level of C3 with associated short-segment moderate to severe stenosis of up to approximately 70% (series 7, image 210). Right vertebral artery otherwise patent within the neck. Attenuated flow seen within the distal vertebral arteries bilaterally related to downstream severe bilateral V4 stenoses. Skeleton: Subtle transverse linear lucency seen extending through the C2 vertebral body, favored to reflect a nutrient foramen (series 9, image 102). No associated prevertebral edema. No other acute osseous abnormality. No discrete lytic or blastic osseous lesions. Mild cervical spondylolysis at C5-6 and C6-7. Other neck: No other acute soft tissue abnormality within the neck. Upper chest: Visualized upper chest demonstrates no acute finding. Upper lobe predominant centrilobular and paraseptal emphysema. Review of the MIP images confirms the above findings CTA HEAD FINDINGS Anterior circulation: Extensive  atheromatous plaque throughout the petrous, cavernous, and supraclinoid segments bilaterally with associated moderate to severe multifocal stenoses. ICAs are patent to the termini. A1 segments patent bilaterally. Duplicated versus fenestrated anterior communicating artery. Anterior cerebral arteries patent to their distal aspects without hemodynamically significant stenosis. No M1 stenosis or occlusion. Normal MCA bifurcations. Distal MCA branches well perfused and symmetric. Posterior circulation: Vertebral arteries patent as they cross into the cranial vault. Bulky calcified plaque seen  involving the proximal left V4 segment with secondary severe occlusive/pre occlusive stenosis (series 7, image 152). Faint markedly attenuated flow seen distally, with essentially no flow within the distal left V4 segment as it reaches the vertebrobasilar junction. Left PICA is perfused and patent. On the right, additional focal atheromatous plaque with severe stenosis within the proximal right V4 segment just prior to the takeoff of the right PICA (series 7, image 144). Markedly attenuated flow within the right V4 segment distally, with a centrally absent flow by the time of the vertebrobasilar junction. Right PICA is grossly perfused proximally. No significant flow seen within the proximal and mid basilar artery. Distal reconstitution at the basilar tip, likely via collateralization from small bilateral posterior communicating arteries. Some back flow into the basilar artery proximally with perfusion of the superior cerebral arteries bilaterally. PCAs remain patent and are well perfused to their distal aspects. Venous sinuses: Grossly patent, although not well assessed due to timing of the contrast bolus. Anatomic variants: None significant. Review of the MIP images confirms the above findings IMPRESSION: 1. 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 Basilar tip is perfused via small bilateral posterior communicating arteries, with perfusion of the bilateral PCAs and SCAs. 2. Atheromatous stenoses about the carotid bifurcations/proximal ICAs bilaterally, with up to 70% narrowing on the right and 65% narrowing on the left. Additional approximate 60% stenosis involving the distal left ICA at the cervical-petrous junction. 3. Severe atheromatous plaque throughout the petrous, cavernous, and supraclinoid ICAs bilaterally with associated moderate to severe multifocal stenoses. 4. Scattered atheromatous plaque within the left CCA with up to 40-50% stenosis.  5. Approximate 50% stenosis at the origin of the right vertebral artery, with additional 60-70% stenoses involving the bilateral V2 segments downstream as above. 6. 65% atheromatous stenosis at the origin of the left subclavian artery. 7. Faint linear lucency extending through the C2 vertebral body, favored to reflect a benign nutrient foramen. If there is a history of trauma or high clinical suspicion for possible cervical spine injury, then further assessment with dedicated cervical spine CT would be recommended. Results were discussed by telephone at the time of interpretation on 08/01/2018 at 1:29 am with Dr. Amie Portland. Electronically Signed   By: Jeannine Boga M.D.   On: 08/01/2018 02:38   Dg Chest 2 View  Result Date: 07/31/2018 CLINICAL DATA:  Weakness and dizziness. EXAM: CHEST - 2 VIEW COMPARISON:  September 03, 2012. FINDINGS: There is no appreciable edema or consolidation. Heart size and pulmonary vascularity are normal. No adenopathy. No bone lesions. IMPRESSION: No edema or consolidation.  Stable cardiac silhouette. Electronically Signed   By: Lowella Grip III M.D.   On: 07/31/2018 12:32   Ct Head Wo Contrast  Result Date: 07/31/2018 CLINICAL DATA:  Altered level of consciousness, history hypertension, coronary artery disease EXAM: CT HEAD WITHOUT CONTRAST TECHNIQUE: Contiguous axial images were obtained from the base of the skull through the vertex without intravenous contrast. Sagittal and coronal MPR images reconstructed from axial data set.  COMPARISON:  None FINDINGS: Brain: Generalized atrophy. Normal ventricular morphology. No midline shift or mass effect. Small vessel chronic ischemic changes of deep cerebral white matter. No intracranial hemorrhage, mass lesion, evidence of acute infarction, or extra-axial fluid collection. Vascular: Atherosclerotic calcification of internal carotid and vertebral arteries at skull base Skull: Intact Sinuses/Orbits: Clear Other: N/A  IMPRESSION: Atrophy with small vessel chronic ischemic changes of deep cerebral white matter. No acute intracranial abnormalities. Extensive atherosclerotic calcifications at the skull base particularly of the internal carotid arteries. Electronically Signed   By: Lavonia Dana M.D.   On: 07/31/2018 12:26   Ct Angio Neck W Or Wo Contrast  Result Date: 08/01/2018 CLINICAL DATA:  Follow-up examination for acute stroke. EXAM: CT ANGIOGRAPHY HEAD AND NECK TECHNIQUE: Multidetector CT imaging of the head and neck was performed using the standard protocol during bolus administration of intravenous contrast. Multiplanar CT image reconstructions and MIPs were obtained to evaluate the vascular anatomy. Carotid stenosis measurements (when applicable) are obtained utilizing NASCET criteria, using the distal internal carotid diameter as the denominator. CONTRAST:  72mL OMNIPAQUE IOHEXOL 350 MG/ML SOLN COMPARISON:  Prior MRI and MRA from 07/31/2018 FINDINGS: CTA NECK FINDINGS Aortic arch: Visualized aortic arch of normal caliber with normal 3 vessel morphology. Scattered atheromatous plaque about the arch and origin of the great vessels. Resultant short-segment stenosis of up to approximately 65% at the origin of the left subclavian artery. Short-segment approximate 40% stenosis at the origin of the right subclavian artery. Visualized subclavian arteries otherwise widely patent. No other hemodynamically significant stenosis about the origin of the great vessels. Right carotid system: Right CCA mildly tortuous proximally. Scattered atheromatous plaque within the right common carotid artery without hemodynamically significant stenosis. Bulky calcified plaque about the right bifurcation/proximal right ICA. Associated stenosis of up to 70% by NASCET criteria at the proximal right ICA. Area of involvement begins at the bifurcation and measures approximately 2.3 cm in length. Distally, right ICA patent to the skull base without  additional stenosis, dissection, or occlusion. Focal tortuosity noted within the distal cervical right ICA just prior to the skull base. Left carotid system: Scattered atheromatous plaque seen throughout the left CCA with up to approximately 40-50% stenosis. Left CCA remains patent to the bifurcation. Multifocal atheromatous plaque about the left bifurcation/proximal left ICA r with resultant stenosis of up to 65% by NASCET criteria. Left ICA patent distally to the skull base without dissection or occlusion. Additional calcified atheromatous plaque about the distal left ICA at the level of the skull base with up to approximately 60% stenosis by NASCET criteria (series 7, image 140). Vertebral arteries: Both vertebral arteries arise from the subclavian arteries. Vertebral arteries largely co dominant. On the left, focal plaque seen within the left V2 segment at the level of C5 with associated moderate to severe stenosis of up to approximately 60-70% (series 7, image 239). Left vertebral artery otherwise widely patent to the skull base. On the right, focal plaque at the origin of the right vertebral artery with associated stenosis of up to approximately 50%. Distally, there is focal plaque involving the distal right V2 segment at the level of C3 with associated short-segment moderate to severe stenosis of up to approximately 70% (series 7, image 210). Right vertebral artery otherwise patent within the neck. Attenuated flow seen within the distal vertebral arteries bilaterally related to downstream severe bilateral V4 stenoses. Skeleton: Subtle transverse linear lucency seen extending through the C2 vertebral body, favored to reflect a nutrient foramen (series 9, image  102). No associated prevertebral edema. No other acute osseous abnormality. No discrete lytic or blastic osseous lesions. Mild cervical spondylolysis at C5-6 and C6-7. Other neck: No other acute soft tissue abnormality within the neck. Upper chest:  Visualized upper chest demonstrates no acute finding. Upper lobe predominant centrilobular and paraseptal emphysema. Review of the MIP images confirms the above findings CTA HEAD FINDINGS Anterior circulation: Extensive atheromatous plaque throughout the petrous, cavernous, and supraclinoid segments bilaterally with associated moderate to severe multifocal stenoses. ICAs are patent to the termini. A1 segments patent bilaterally. Duplicated versus fenestrated anterior communicating artery. Anterior cerebral arteries patent to their distal aspects without hemodynamically significant stenosis. No M1 stenosis or occlusion. Normal MCA bifurcations. Distal MCA branches well perfused and symmetric. Posterior circulation: Vertebral arteries patent as they cross into the cranial vault. Bulky calcified plaque seen involving the proximal left V4 segment with secondary severe occlusive/pre occlusive stenosis (series 7, image 152). Faint markedly attenuated flow seen distally, with essentially no flow within the distal left V4 segment as it reaches the vertebrobasilar junction. Left PICA is perfused and patent. On the right, additional focal atheromatous plaque with severe stenosis within the proximal right V4 segment just prior to the takeoff of the right PICA (series 7, image 144). Markedly attenuated flow within the right V4 segment distally, with a centrally absent flow by the time of the vertebrobasilar junction. Right PICA is grossly perfused proximally. No significant flow seen within the proximal and mid basilar artery. Distal reconstitution at the basilar tip, likely via collateralization from small bilateral posterior communicating arteries. Some back flow into the basilar artery proximally with perfusion of the superior cerebral arteries bilaterally. PCAs remain patent and are well perfused to their distal aspects. Venous sinuses: Grossly patent, although not well assessed due to timing of the contrast bolus. Anatomic  variants: None significant. Review of the MIP images confirms the above findings IMPRESSION: 1. 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 Basilar tip is perfused via small bilateral posterior communicating arteries, with perfusion of the bilateral PCAs and SCAs. 2. Atheromatous stenoses about the carotid bifurcations/proximal ICAs bilaterally, with up to 70% narrowing on the right and 65% narrowing on the left. Additional approximate 60% stenosis involving the distal left ICA at the cervical-petrous junction. 3. Severe atheromatous plaque throughout the petrous, cavernous, and supraclinoid ICAs bilaterally with associated moderate to severe multifocal stenoses. 4. Scattered atheromatous plaque within the left CCA with up to 40-50% stenosis. 5. Approximate 50% stenosis at the origin of the right vertebral artery, with additional 60-70% stenoses involving the bilateral V2 segments downstream as above. 6. 65% atheromatous stenosis at the origin of the left subclavian artery. 7. Faint linear lucency extending through the C2 vertebral body, favored to reflect a benign nutrient foramen. If there is a history of trauma or high clinical suspicion for possible cervical spine injury, then further assessment with dedicated cervical spine CT would be recommended. Results were discussed by telephone at the time of interpretation on 08/01/2018 at 1:29 am with Dr. Amie Portland. Electronically Signed   By: Jeannine Boga M.D.   On: 08/01/2018 02:38   Mr Angio Head Wo Contrast  Result Date: 07/31/2018 CLINICAL DATA:  Headache, dizziness and gait disturbance. Blurred vision. Left-sided weakness. Symptoms began yesterday. EXAM: MRI HEAD WITHOUT CONTRAST MRA HEAD WITHOUT CONTRAST TECHNIQUE: Multiplanar, multiecho pulse sequences of the brain and surrounding structures were obtained without intravenous contrast. Angiographic images of the head were obtained using  MRA technique without contrast. COMPARISON:  CT 07/31/2018 FINDINGS: MRI HEAD FINDINGS Brain: Diffusion imaging shows multiple foci of acute infarction affecting the pons, more to the right of midline than the left. No other acute infarction. Elsewhere, there is no cerebellar insult. Cerebral hemispheres show mild chronic small-vessel change of the deep and subcortical white matter. No large vessel territory infarction. No mass lesion, hemorrhage, hydrocephalus or extra-axial collection. Vascular: Major vessels at the base of the brain show flow. Skull and upper cervical spine: Negative Sinuses/Orbits: Clear/normal Other: None MRA HEAD FINDINGS Both internal carotid arteries are patent through the skull base and siphon regions. No siphon stenosis. The anterior and middle cerebral vessels are patent without proximal stenosis, aneurysm or vascular malformation. Both vertebral arteries show flow at the foramen magnum level and give supply to both posteroinferior cerebellar arteries. No flow is seen distal to that in the V4 segments reaching the basilar. No flow is seen in the basilar artery until the basilar tip, where there is reconstituted flow supplying both superior cerebellar and posterior cerebral arteries. Patent posterior communicating arteries are responsible for this supply. 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 Both posteroinferior cerebellar arteries receive flow. Patent posterior communicating arteries on each side reconstitute the basilar tip and give supply to the superior cerebellar and posterior cerebral arteries. Electronically Signed   By: Nelson Chimes M.D.   On: 07/31/2018 14:45   Mr Brain Wo Contrast  Result Date: 07/31/2018 CLINICAL DATA:  Headache, dizziness and gait disturbance.  Blurred vision. Left-sided weakness. Symptoms began yesterday. EXAM: MRI HEAD WITHOUT CONTRAST MRA HEAD WITHOUT CONTRAST TECHNIQUE: Multiplanar, multiecho pulse sequences of the brain and surrounding structures were obtained without intravenous contrast. Angiographic images of the head were obtained using MRA technique without contrast. COMPARISON:  CT 07/31/2018 FINDINGS: MRI HEAD FINDINGS Brain: Diffusion imaging shows multiple foci of acute infarction affecting the pons, more to the right of midline than the left. No other acute infarction. Elsewhere, there is no cerebellar insult. Cerebral hemispheres show mild chronic small-vessel change of the deep and subcortical white matter. No large vessel territory infarction. No mass lesion, hemorrhage, hydrocephalus or extra-axial collection. Vascular: Major vessels at the base of the brain show flow. Skull and upper cervical spine: Negative Sinuses/Orbits: Clear/normal Other: None MRA HEAD FINDINGS Both internal carotid arteries are patent through the skull base and siphon regions. No siphon stenosis. The anterior and middle cerebral vessels are patent without proximal stenosis, aneurysm or vascular malformation. Both vertebral arteries show flow at the foramen magnum level and give supply to both posteroinferior cerebellar arteries. No flow is seen distal to that in the V4 segments reaching the basilar. No flow is seen in the basilar artery until the basilar tip, where there is reconstituted flow supplying both superior cerebellar and posterior cerebral arteries. Patent posterior communicating arteries are responsible for this supply. 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 Both posteroinferior cerebellar arteries receive flow. Patent posterior communicating  arteries on each side reconstitute the basilar tip and give supply to the superior cerebellar and posterior cerebral arteries. Electronically Signed   By: Nelson Chimes M.D.   On: 07/31/2018 14:45  Labs:   Basic Metabolic Panel: Recent Labs  Lab 07/31/18 1104 07/31/18 2211 08/02/18 0537 08/03/18 0332  NA 129*  --  128* 128*  K 3.8  --  3.8 3.8  CL 95*  --  94* 95*  CO2 23  --  23 23  GLUCOSE 126*  --  106* 105*  BUN 13  --  14 14  CREATININE 0.83 1.01 1.00 0.99  CALCIUM 9.7  --  9.4 9.2   GFR Estimated Creatinine Clearance: 85.6 mL/min (by C-G formula based on SCr of 0.99 mg/dL). Liver Function Tests: Recent Labs  Lab 07/31/18 1104  AST 19  ALT 12  ALKPHOS 44  BILITOT 1.0  PROT 8.4*  ALBUMIN 4.8   No results for input(s): LIPASE, AMYLASE in the last 168 hours. No results for input(s): AMMONIA in the last 168 hours. Coagulation profile Recent Labs  Lab 08/02/18 0537  INR 1.0    CBC: Recent Labs  Lab 07/31/18 1104 07/31/18 2211 08/02/18 0537  WBC 8.4 9.1 7.9  NEUTROABS 6.5  --   --   HGB 15.0 15.1 14.6  HCT 44.3 43.0 41.3  MCV 95.7 93.3 92.4  PLT 262 261 250   Cardiac Enzymes: No results for input(s): CKTOTAL, CKMB, CKMBINDEX, TROPONINI in the last 168 hours. BNP: Invalid input(s): POCBNP CBG: No results for input(s): GLUCAP in the last 168 hours. D-Dimer No results for input(s): DDIMER in the last 72 hours. Hgb A1c Recent Labs    08/01/18 0351  HGBA1C 5.4   Lipid Profile Recent Labs    08/01/18 0351  CHOL 194  HDL 73  LDLCALC 101*  TRIG 98  CHOLHDL 2.7   Thyroid function studies No results for input(s): TSH, T4TOTAL, T3FREE, THYROIDAB in the last 72 hours.  Invalid input(s): FREET3 Anemia work up No results for input(s): VITAMINB12, FOLATE, FERRITIN, TIBC, IRON, RETICCTPCT in the last 72 hours. Microbiology Recent Results (from the past 240 hour(s))  SARS Coronavirus 2 (CEPHEID - Performed in [place] hospital lab), Hosp  Order     Status: None   Collection Time: 07/31/18 11:45 AM   Specimen: Nasopharyngeal Swab  Result Value Ref Range Status   SARS Coronavirus 2 NEGATIVE NEGATIVE Final    Comment: (NOTE) If result is NEGATIVE SARS-CoV-2 target nucleic acids are NOT DETECTED. The SARS-CoV-2 RNA is generally detectable in upper and lower  respiratory specimens during the acute phase of infection. The lowest  concentration of SARS-CoV-2 viral copies this assay can detect is 250  copies / mL. A negative result does not preclude SARS-CoV-2 infection  and should not be used as the sole basis for treatment or other  patient management decisions.  A negative result may occur with  improper specimen collection / handling, submission of specimen other  than nasopharyngeal swab, presence of viral mutation(s) within the  areas targeted by this assay, and inadequate number of viral copies  (<250 copies / mL). A negative result must be combined with clinical  observations, patient history, and epidemiological information. If result is POSITIVE SARS-CoV-2 target nucleic acids are DETECTED. The SARS-CoV-2 RNA is generally detectable in upper and lower  respiratory specimens dur ing the acute phase of infection.  Positive  results are indicative of active infection with SARS-CoV-2.  Clinical  correlation with patient history and other diagnostic information is  necessary to determine patient infection status.  Positive results do  not rule out bacterial infection or co-infection with other viruses. If result is PRESUMPTIVE POSTIVE  SARS-CoV-2 nucleic acids MAY BE PRESENT.   A presumptive positive result was obtained on the submitted specimen  and confirmed on repeat testing.  While 2019 novel coronavirus  (SARS-CoV-2) nucleic acids may be present in the submitted sample  additional confirmatory testing may be necessary for epidemiological  and / or clinical management purposes  to differentiate between  SARS-CoV-2  and other Sarbecovirus currently known to infect humans.  If clinically indicated additional testing with an alternate test  methodology [phone]) is advised. The SARS-CoV-2 RNA is generally  detectable in upper and lower respiratory sp ecimens during the acute  phase of infection. The expected result is Negative. Fact Sheet for Patients:  StrictlyIdeas.no Fact Sheet for Healthcare Providers: BankingDealers.co.za This test is not yet approved or cleared by the [place] FDA and has been authorized for detection and/or diagnosis of SARS-CoV-2 by FDA under an Emergency Use Authorization (EUA).  This EUA will remain in effect (meaning this test can be used) for the duration of the COVID-19 declaration under Section 564(b)(1) of the Act, 21 U.S.C. section 360bbb-3(b)(1), unless the authorization is terminated or revoked sooner. Performed at [facility], [address], [place], [place] [address]   Surgical PCR screen     Status: None   Collection Time: 08/02/18  4:39 AM   Specimen: Nasal Mucosa; Nasal Swab  Result Value Ref Range Status   MRSA, PCR NEGATIVE NEGATIVE Final   Staphylococcus aureus NEGATIVE NEGATIVE Final    Comment: (NOTE) The Xpert SA Assay (FDA approved for NASAL specimens in patients 62 years of age and older), is one component of a comprehensive surveillance program. It is not intended to diagnose infection nor to guide or monitor treatment. Performed at [facility], [place] [address], [place], [place] [address]      Discharge Instructions:   Discharge Instructions    Ambulatory referral to Neurology   Complete by: As directed    Follow up with stroke clinic NP (Brevon Dewald Vanschaick or Cecille Rubin, if both not available, consider Zachery Dauer, or [place]) at [facility] in about 4 weeks. Thanks.   Diet - low sodium heart healthy   Complete by: As directed    Discharge instructions   Complete by: As directed      aspirin 325 mg daily and clopidogrel 75 mg daily folloing DAPT load. Continue DAPT for 3 months and then plavix alone Limit alcohol to 2 drinks/day Goal BP is top number (systolic) [phone]   Increase activity slowly   Complete by: As directed      Allergies as of 08/03/2018      Reactions   Prednisone Other (See Comments)   Chest pain      Medication List    TAKE these medications   amLODipine 5 MG tablet Commonly known as: NORVASC Take 1 tablet (5 mg total) by mouth daily.   aspirin 325 MG tablet Take 1 tablet (325 mg total) by mouth daily. What changed:   medication strength  how much to take   atorvastatin 80 MG tablet Commonly known as: LIPITOR Take 1 tablet (80 mg total) by mouth every evening.   clopidogrel 75 MG tablet Commonly known as: PLAVIX Take 1 tablet (75 mg total) by mouth daily.   HYDROcodone-acetaminophen 10-325 MG tablet Commonly known as: NORCO Take 1 tablet by mouth every 4 (four) hours as needed.   lisinopril 20 MG tablet Commonly known as: ZESTRIL Take 20 mg by mouth 2 (two) times daily.   metoprolol tartrate  25 MG tablet Commonly known as: LOPRESSOR TAKE 1/2 TABLET BY MOUTH TWICE DAILY   nitroGLYCERIN 0.4 MG SL tablet Commonly known as: NITROSTAT Place 1 tablet (0.4 mg total) under the tongue every 5 (five) minutes as needed for chest pain (CP or SOB).   omeprazole 20 MG capsule Commonly known as: PRILOSEC Take 20 mg by mouth daily.      Follow-up Information    [facility]. Schedule an appointment as soon as possible for a visit in 4 week(s).   Specialty: Neurology Contact information: [address] [place] [phone]       Luanne Bras, MD. Schedule an appointment as soon as possible for a visit in 6 week(s).   Specialties: Interventional Radiology, Radiology Contact information: [place] [address] [phone]        [name], MD Follow up in 1 week(s).   Specialty: Internal Medicine Why: BP check and BMP Contact information: [address] [place] [address] [phone]            Time coordinating discharge: 35 min  Signed:  Geradine Girt DO  [facility] 08/03/2018, 8:30 AM

## 2018-08-05 ENCOUNTER — Encounter (HOSPITAL_COMMUNITY): Payer: Self-pay | Admitting: Interventional Radiology

## 2018-08-06 ENCOUNTER — Telehealth (HOSPITAL_COMMUNITY): Payer: Self-pay | Admitting: Radiology

## 2018-08-06 DIAGNOSIS — I639 Cerebral infarction, unspecified: Secondary | ICD-10-CM | POA: Diagnosis not present

## 2018-08-06 DIAGNOSIS — Z1389 Encounter for screening for other disorder: Secondary | ICD-10-CM | POA: Diagnosis not present

## 2018-08-06 DIAGNOSIS — F419 Anxiety disorder, unspecified: Secondary | ICD-10-CM | POA: Diagnosis not present

## 2018-08-06 DIAGNOSIS — F1010 Alcohol abuse, uncomplicated: Secondary | ICD-10-CM | POA: Diagnosis not present

## 2018-08-06 DIAGNOSIS — Z6822 Body mass index (BMI) 22.0-22.9, adult: Secondary | ICD-10-CM | POA: Diagnosis not present

## 2018-08-06 NOTE — Telephone Encounter (Signed)
Pt's wife called to schedule f/u office visit with [name]. Per patient's discharge papers he is to see [name] in the office within 6 week's time. Per Dr. Estanislado Pandy he does not need to see the patient in the office. We will have the pt get a carotid US in 6 month's time. The pt and wife agree with this plan of care. JM

## 2018-08-12 DIAGNOSIS — Z1389 Encounter for screening for other disorder: Secondary | ICD-10-CM | POA: Diagnosis not present

## 2018-08-12 DIAGNOSIS — F1010 Alcohol abuse, uncomplicated: Secondary | ICD-10-CM | POA: Diagnosis not present

## 2018-08-12 DIAGNOSIS — I639 Cerebral infarction, unspecified: Secondary | ICD-10-CM | POA: Diagnosis not present

## 2018-08-12 DIAGNOSIS — F419 Anxiety disorder, unspecified: Secondary | ICD-10-CM | POA: Diagnosis not present

## 2018-09-02 ENCOUNTER — Ambulatory Visit (INDEPENDENT_AMBULATORY_CARE_PROVIDER_SITE_OTHER): Payer: BC Managed Care – PPO | Admitting: Otolaryngology

## 2018-09-02 DIAGNOSIS — D380 Neoplasm of uncertain behavior of larynx: Secondary | ICD-10-CM

## 2018-09-02 DIAGNOSIS — R070 Pain in throat: Secondary | ICD-10-CM

## 2018-09-02 DIAGNOSIS — K219 Gastro-esophageal reflux disease without esophagitis: Secondary | ICD-10-CM

## 2018-09-03 DIAGNOSIS — Z23 Encounter for immunization: Secondary | ICD-10-CM | POA: Diagnosis not present

## 2018-09-03 DIAGNOSIS — G894 Chronic pain syndrome: Secondary | ICD-10-CM | POA: Diagnosis not present

## 2018-09-03 DIAGNOSIS — Z681 Body mass index (BMI) 19 or less, adult: Secondary | ICD-10-CM | POA: Diagnosis not present

## 2018-09-10 ENCOUNTER — Other Ambulatory Visit: Payer: Self-pay

## 2018-09-10 ENCOUNTER — Telehealth: Payer: BC Managed Care – PPO | Admitting: Cardiovascular Disease

## 2018-09-10 ENCOUNTER — Telehealth: Payer: Self-pay

## 2018-09-10 ENCOUNTER — Encounter: Payer: Self-pay | Admitting: Adult Health

## 2018-09-10 ENCOUNTER — Ambulatory Visit: Payer: BC Managed Care – PPO | Admitting: Adult Health

## 2018-09-10 VITALS — BP 166/94 | HR 69 | Temp 97.8°F | Ht 72.0 in | Wt 174.4 lb

## 2018-09-10 DIAGNOSIS — E785 Hyperlipidemia, unspecified: Secondary | ICD-10-CM | POA: Diagnosis not present

## 2018-09-10 DIAGNOSIS — I63533 Cerebral infarction due to unspecified occlusion or stenosis of bilateral posterior cerebral arteries: Secondary | ICD-10-CM | POA: Diagnosis not present

## 2018-09-10 DIAGNOSIS — F1010 Alcohol abuse, uncomplicated: Secondary | ICD-10-CM | POA: Diagnosis not present

## 2018-09-10 DIAGNOSIS — I10 Essential (primary) hypertension: Secondary | ICD-10-CM | POA: Diagnosis not present

## 2018-09-10 DIAGNOSIS — R2681 Unsteadiness on feet: Secondary | ICD-10-CM

## 2018-09-10 NOTE — Telephone Encounter (Signed)
Left a message for [name] to call me back. Per [name] we need to know the urgency of the procedure and how long he will need to be off his Plavix and ASA. Office number was provided.

## 2018-09-10 NOTE — Progress Notes (Signed)
[facility] [address] [place]. [place] [address] [phone]       [place]  [name] Date of Birth:  1960/10/14 Medical Record Number:  [id number]   Reason for Referral:  hospital stroke follow up    CHIEF COMPLAINT:  Chief Complaint  Patient presents with   Hospitalization Follow-up    Wife present. Rm 9. Patient's wife like to discuss whether he would be able to have a biopsy done.     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  History obtained from patient, wife and chart review. Reviewed all radiology images and labs personally.  [name] is a 58 y.o. male with history of chronic LBP, HTN, HLD, CAD presented to [facility] ED on 07/31/2018 with blurred vision, ataxia, incoordination, and weakness in legs.  Transferred to [facility] for further evaluation/management.  MRI showed R > L pontine infarcts.  MRA showed occlusion bilateral VA's.  CTA head/neck showed bilateral V4 stenosis with occlusion distal VA and BA distally, right ICA 70% and left ICA 65% narrowing, distal left ICA 60% at cervical petrous junction, and severe ICA atherosclerosis, left CCA 40 to 50% stenosis, right VA 50% stenosis, bilateral V2 60 to 70% stenosis and left subclavian 65% stenosis.  Underwent cerebral angiogram by Dr. 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  No further interventions recommended at that time and recommended follow-up with vascular surgery outpatient.  2D echo normal EF with mild LVH and diastolic dysfunction with trivial aortic regurgitation.  LDL 101 and A1c 5.4.  UDS positive for opiates, benzos and THC.  Recommended DAPT for 3 months then Plavix alone due to intracranial stenosis.  BP as high as 244/114 during admission and recommended long-term BP goal 1  30-1 60 given severe vascular stenosis.  Recommended continuation of atorvastatin 80 mg daily.  Heavy EtOH use and substance use with cessation counseling provided.  Other stroke risk factors include former tobacco use, family history of stroke and CAD s/p DES 2014.  He was discharged home in stable condition without therapy needs as he returned to baseline.  [name] is being seen today for hospital follow-up accompanied by his wife.  Residual deficits of mild blurring of vision, dizziness, balance difficulties and weakness in both legs.  He does endorse overall improvement but does continue to have difficulties with daily functioning.  Therapy was not recommended at discharge.  He has not returned to work as he was previously working as an Clinical biochemist and currently in the process of applying for [place] disability due to difficulties with ambulation and intolerance to heat.  He continues on aspirin 325 mg daily and clopidogrel 75 mg daily without bleeding or bruising.  Continues on atorvastatin 80 mg daily without myalgias.  Blood pressure today 166/94.  He does monitor at home and typically around 140s. He is questioning undergoing direct laryngoscopy with biopsy of laryngeal mass which is currently scheduled on 10/01/2018.  Per patient report, ongoing lymph node issues for the past 6-7 months with undergoing laryngoscopy for further evaluation.  Ongoing alcohol use approximately 4-6 beers daily and occasional THC use which wife reports he uses for self-medicating for anxiety or stress. Denies new or worsening stroke/TIA symptoms.    ROS:   All 14 systems reviewed with patient concerns of  the following and all other negative Trouble swallowing, blurred vision, malaise, easy bruising, lymph nodes, slurred speech, dizziness and anxiety    PMH:  Past Medical History:  Diagnosis Date   Anxiety    Arthritis    Bradycardia    a. During 11/2012 adm: lopressor decreased.   Chronic  lower back pain    "I work Architect; messed back up ~ 30 yr ago; paralyzed for 2 days then" (11/05/2012)   Coronary artery disease    a. Diag cath 10/2012 for CP/abnormal nuc -> planned PCI s/p LAD atherectomy/DES placement 11/05/12.   GERD (gastroesophageal reflux disease)    Hypercholesteremia    Hypertension     PSH:  Past Surgical History:  Procedure Laterality Date   CARDIAC CATHETERIZATION  10/29/2012   CORONARY ANGIOPLASTY WITH STENT PLACEMENT  11/05/2012   HEMORRHOID SURGERY  ~ 2010   INSERTION OF MESH N/A 06/02/2015   Procedure: INSERTION OF MESH;  Surgeon: [name], MD;  Location: [facility];  Service: General;  Laterality: N/A;   IR ANGIO INTRA EXTRACRAN SEL COM CAROTID INNOMINATE BILAT MOD SED  08/02/2018   IR ANGIO VERTEBRAL SEL VERTEBRAL BILAT MOD SED  08/02/2018   LEFT HEART CATHETERIZATION WITH CORONARY ANGIOGRAM N/A 10/30/2012   Procedure: LEFT HEART CATHETERIZATION WITH CORONARY ANGIOGRAM;  Surgeon: [name], MD;  Location: [facility] CATH LAB;  Service: Cardiovascular;  Laterality: N/A;   PERCUTANEOUS CORONARY ROTOBLATOR INTERVENTION (PCI-R) N/A 11/05/2012   Procedure: PERCUTANEOUS CORONARY ROTOBLATOR INTERVENTION (PCI-R);  Surgeon: [name], MD;  Location: [facility] CATH LAB;  Service: Cardiovascular;  Laterality: N/A;   UMBILICAL HERNIA REPAIR N/A 06/02/2015   Procedure: UMBILICAL HERNIORRHAPHY WITH MESH;  Surgeon: [name], MD;  Location: [facility];  Service: General;  Laterality: N/A;    Social History:  Social History   Socioeconomic History   Marital status: Married    Spouse name: Not on file   Number of children: Not on file   Years of education: Not on file   Highest education level: Not on file  Occupational History   Not on file  Social Needs   Financial resource strain: Not on file   Food insecurity    Worry: Not on file    Inability: Not on file   Transportation needs    Medical: Not on file    Non-medical: Not on file    Tobacco Use   Smoking status: Former Smoker    Packs/day: 3.00    Years: 32.00    Pack years: 96.00    Types: Cigarettes    Start date: 08/29/1977    Quit date: 11/29/2006    Years since quitting: 11.7   Smokeless tobacco: Former Systems developer    Types: Chew   Tobacco comment: 11/05/2012 "chewed tobacco when I play ball; aien't chewed since age 45"  Substance and Sexual Activity   Alcohol use: Yes    Alcohol/week: 0.0 standard drinks    Comment: "couple of beers daily"   Drug use: No   Sexual activity: Not Currently  Lifestyle   Physical activity    Days per week: Not on file    Minutes per session: Not on file   Stress: Not on file  Relationships   Social connections    Talks on phone: Not on file    Gets together: Not on file    Attends religious service: Not on file    Active member of club or organization: Not on file    Attends meetings  of clubs or organizations: Not on file    Relationship status: Not on file   Intimate partner violence    Fear of current or ex partner: Not on file    Emotionally abused: Not on file    Physically abused: Not on file    Forced sexual activity: Not on file  Other Topics Concern   Not on file  Social History Narrative   Not on file    Family History:  Family History  Problem Relation Age of Onset   Heart disease Father    Heart disease Sister     Medications:   Current Outpatient Medications on File Prior to Visit  Medication Sig Dispense Refill   amLODipine (NORVASC) 5 MG tablet Take 5 mg by mouth daily.     aspirin 325 MG tablet Take 1 tablet (325 mg total) by mouth daily.     atorvastatin (LIPITOR) 80 MG tablet Take 1 tablet (80 mg total) by mouth every evening. 90 tablet 3   clopidogrel (PLAVIX) 75 MG tablet Take 1 tablet (75 mg total) by mouth daily. 30 tablet 1   HYDROcodone-acetaminophen (NORCO) 10-325 MG tablet Take 1 tablet by mouth every 4 (four) hours as needed. 50 tablet 0   lisinopril (ZESTRIL) 20 MG  tablet Take 1 tablet (20 mg total) by mouth daily. 30 tablet 0   metoprolol tartrate (LOPRESSOR) 25 MG tablet TAKE 1/2 TABLET BY MOUTH TWICE DAILY 30 tablet 3   nitroGLYCERIN (NITROSTAT) 0.4 MG SL tablet Place 1 tablet (0.4 mg total) under the tongue every 5 (five) minutes as needed for chest pain (CP or SOB). 25 tablet 3   omeprazole (PRILOSEC) 20 MG capsule Take 20 mg by mouth daily.      No current facility-administered medications on file prior to visit.     Allergies:   Allergies  Allergen Reactions   Prednisone Other (See Comments)    Chest pain     Physical Exam  Vitals:   09/10/18 0908  BP: (!) 166/94  Pulse: 69  Temp: 97.8 F (36.6 C)  TempSrc: Oral  Weight: 174 lb 6.4 oz (79.1 kg)  Height: 6' (1.829 m)   Body mass index is 23.65 kg/m. No exam data present  No flowsheet data found.   General: Frail elderly-appearing Caucasian male, seated, in no evident distress Head: head normocephalic and atraumatic.   Neck: supple with no carotid or supraclavicular bruits Cardiovascular: regular rate and rhythm, no murmurs Musculoskeletal: no deformity Skin:  no rash/petichiae Vascular:  Normal pulses all extremities   Neurologic Exam Mental Status: Awake and fully alert. Oriented to place and time. Recent and remote memory intact. Attention span, concentration and fund of knowledge appropriate. Mood appropriate and flat affect. Cranial Nerves: Fundoscopic exam reveals sharp disc margins. Pupils equal, briskly reactive to light. Extraocular movements full without nystagmus. Visual fields full to confrontation. Hearing intact. Facial sensation intact. Face, tongue, palate moves normally and symmetrically.  Motor: Normal bulk and tone. Normal strength in all tested extremity muscles except mild left hip flexor weakness with questionable amount of participation versus giveaway weakness. Sensory.: intact to touch , pinprick , position and vibratory sensation.  Coordination:  Rapid alternating movements normal in all extremities. Finger-to-nose and heel-to-shin performed accurately bilaterally. Gait and Station: Arises from chair without difficulty. Stance is normal. Gait demonstrates  mild favoring of left leg but is able to ambulate without assistive device.  Mild difficulty with tandem gait Reflexes: 1+ and symmetric. Toes downgoing.  NIHSS  0 Modified Rankin  2    Diagnostic Data (Labs, Imaging, Testing)  CT HEAD WO CONTRAST 07/31/2018 IMPRESSION: Atrophy with small vessel chronic ischemic changes of deep cerebral white matter. No acute intracranial abnormalities. Extensive atherosclerotic calcifications at the skull base particularly of the internal carotid arteries.  CT ANGIO HEAD W OR WO CONTRAST CT ANGIO NECK W OR WO CONTRAST 08/01/2018 IMPRESSION: 1. 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 Basilar tip is perfused via small bilateral posterior communicating arteries, with perfusion of the bilateral PCAs and SCAs. 2. Atheromatous stenoses about the carotid bifurcations/proximal ICAs bilaterally, with up to 70% narrowing on the right and 65% narrowing on the left. Additional approximate 60% stenosis involving the distal left ICA at the cervical-petrous junction. 3. Severe atheromatous plaque throughout the petrous, cavernous, and supraclinoid ICAs bilaterally with associated moderate to severe multifocal stenoses. 4. Scattered atheromatous plaque within the left CCA with up to 40-50% stenosis. 5. Approximate 50% stenosis at the origin of the right vertebral artery, with additional 60-70% stenoses involving the bilateral V2 segments downstream as above. 6. 65% atheromatous stenosis at the origin of the left subclavian artery. 7. Faint linear lucency extending through the C2 vertebral body, favored to reflect a benign nutrient foramen. If there is a history of trauma or  high clinical suspicion for possible cervical spine injury, then further assessment with dedicated cervical spine CT would be recommended.  MR BRAIN WO CONTRAST MR MRA HEAD  07/31/2018 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 Both posteroinferior cerebellar arteries receive flow. Patent posterior communicating arteries on each side reconstitute the basilar tip and give supply to the superior cerebellar and posterior cerebral arteries.  CEREBRAL ANGIOGRAM 08/02/2018 IMPRESSION: Angiographically occluded bilateral vertebrobasilar junctions just distal to the posteroinferior cerebellar arteries. Approximately 65% stenosis of the right internal carotid artery just distal to the bulb. Moderate arteriosclerotic narrowing of the internal carotid arteries in the petrous cervical region, and also the petrous cavernous regions bilaterally. Retrograde opacification of the basilar artery terminus via the right posterior communicating artery via the right internal carotid artery as described.  ECHOCARDIOGRAM 08/01/2018 IMPRESSIONS  1. The left ventricle has normal systolic function, with an ejection fraction of 55-60%. The cavity size was normal. There is mildly increased left ventricular wall thickness. Left ventricular diastolic Doppler parameters are consistent with impaired  relaxation.  2. The right ventricle has normal systolic function. The cavity was normal. There is no increase in right ventricular wall thickness. Right ventricular systolic pressure could not be assessed.  3. Aortic valve regurgitation is trivial by color flow Doppler. No stenosis of the aortic valve.  4. No intracardiac thrombi or masses were visualized.  5. The interatrial septum was not well visualized.  6.  The inferior vena cava was normal in size with <50% respiratory variability    ASSESSMENT: [name] is a 58 y.o. 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 Vascular risk factors include uncontrolled HTN, HLD, CAD, intra-and extracranial stenosis heavy EtOH use and substance abuse.  Residual deficits of subjective intermittent blurred vision, dizziness, balance difficulties and subjective lower extremity weakness.  PLAN:  1. Bilateral pontine infarcts: Continue aspirin 325 mg daily and clopidogrel 75 mg daily  and atorvastatin for secondary stroke prevention.  Continue DAPT for total of 110-month duration and then discontinue aspirin and continue on Plavix alone.  Maintain strict control of hypertension with blood pressure goal between 130-160, diabetes with hemoglobin A1c goal below 6.5% and cholesterol with LDL cholesterol (bad cholesterol) goal below 70 mg/dL.  I also advised the patient to eat a healthy diet with plenty of whole grains, cereals, fruits and vegetables, exercise regularly with at least 30 minutes of continuous activity daily and maintain ideal body weight. 2. Residual deficits: Referral placed to physical and occupational therapy.  Recommended scheduling follow-up with ophthalmology in 2 to 3 months if he continues to experience blurred vision 3. HTN: Advised to continue current treatment regimen.  Advised to continue to monitor at home along with continued follow-up with PCP for management 4. HLD: Advised to continue current treatment regimen along with continued follow-up with PCP for future prescribing and monitoring of lipid panel 5. Tobacco/substance use: Discussion regarding importance of complete cessation of alcohol and THC use but patient is not interested in quitting at this time.  Provided  education regarding importance.   6. Intra-and extracranial stenosis: Complete 3 months DAPT along with continuation of statin.  Vascular surgery recommended six-month follow-up with carotid ultrasound -he will be called to schedule study 7. In regards to undergoing procedure, office will reach out to his ENT specialist for further information regarding indication and priority of biopsy.  Typically recommend waiting 4 to 6 months post stroke prior to undergoing elective procedure with discontinuing blood thinners due to increased risk of recurrent stroke during that time.  Exceptions would be if benefit of procedure outweighs risk of recurrent stroke    Follow up in 3 months or call earlier if needed   Greater than 50% of time during this 45 minute visit was spent on counseling, explanation of diagnosis of bilateral pontine infarcts, reviewing risk factor management of HTN, HLD, substance abuse and injury and extracranial stenosis, planning of further management along with potential future management, and discussion with patient and family answering all questions.    Frann Rider, AGNP-BC  [facility] [address] [place], [place] [address]  Phone [phone] Fax [phone] Note: This document was prepared with digital dictation and possible smart phrase technology. Any transcriptional errors that result from this process are unintentional.

## 2018-09-10 NOTE — Patient Instructions (Signed)
Referral placed for outpatient physical and occupational therapy  Follow up with Dr. Estanislado Pandy towards end of Jan 2021 for repeat carotid ultrasound  Continue aspirin 325 mg daily and clopidogrel 75 mg daily  and atorvastatin for secondary stroke prevention  Continue aspirin and Plavix for an additional 2 months and at that time discontinue aspirin and continue Plavix alone  Continue to follow up with PCP regarding blood pressure and cholesterol management   Continue to monitor blood pressure at home  Maintain strict control of hypertension with blood pressure goal below 130/90, diabetes with hemoglobin A1c goal below 6.5% and cholesterol with LDL cholesterol (bad cholesterol) goal below 70 mg/dL. I also advised the patient to eat a healthy diet with plenty of whole grains, cereals, fruits and vegetables, exercise regularly and maintain ideal body weight.  Followup in the future with me in 3 months or call earlier if needed       Thank you for coming to see [place] at [facility]. I hope we have been able to provide you high quality care today.  You may receive a patient satisfaction survey over the next few weeks. We would appreciate your feedback and comments so that we may continue to improve ourselves and the health of our patients.

## 2018-09-11 NOTE — Progress Notes (Signed)
I agree with the above plan 

## 2018-09-18 ENCOUNTER — Ambulatory Visit (HOSPITAL_COMMUNITY): Payer: BC Managed Care – PPO

## 2018-09-18 ENCOUNTER — Other Ambulatory Visit: Payer: Self-pay

## 2018-09-18 ENCOUNTER — Ambulatory Visit (HOSPITAL_COMMUNITY): Payer: BC Managed Care – PPO | Attending: Adult Health | Admitting: Occupational Therapy

## 2018-09-18 ENCOUNTER — Encounter (HOSPITAL_COMMUNITY): Payer: Self-pay | Admitting: Occupational Therapy

## 2018-09-18 ENCOUNTER — Encounter (HOSPITAL_COMMUNITY): Payer: Self-pay

## 2018-09-18 DIAGNOSIS — R29898 Other symptoms and signs involving the musculoskeletal system: Secondary | ICD-10-CM | POA: Diagnosis not present

## 2018-09-18 DIAGNOSIS — M6281 Muscle weakness (generalized): Secondary | ICD-10-CM | POA: Diagnosis not present

## 2018-09-18 DIAGNOSIS — R2689 Other abnormalities of gait and mobility: Secondary | ICD-10-CM | POA: Diagnosis not present
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
## 2018-09-18 NOTE — Patient Instructions (Signed)
Home Exercises Program Theraputty Exercises  Do the following exercises 2 times a day using your affected hand.  1. Roll putty into a ball.  2. Make into a pancake.  3. Roll putty into a roll.  4. Pinch along log with first finger and thumb.   5. Make into a ball.  6. Roll it back into a log.   7. Pinch using thumb and side of first finger.  8. Roll into a ball, then flatten into a pancake.  9. Using your fingers, make putty into a mountain.   
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
## 2018-09-20 ENCOUNTER — Encounter (HOSPITAL_COMMUNITY): Payer: Self-pay

## 2018-09-20 ENCOUNTER — Ambulatory Visit (HOSPITAL_COMMUNITY): Payer: BC Managed Care – PPO

## 2018-09-20 ENCOUNTER — Other Ambulatory Visit: Payer: Self-pay

## 2018-09-20 DIAGNOSIS — M6281 Muscle weakness (generalized): Secondary | ICD-10-CM | POA: Diagnosis not present

## 2018-09-20 DIAGNOSIS — R2689 Other abnormalities of gait and mobility: Secondary | ICD-10-CM

## 2018-09-20 DIAGNOSIS — R29898 Other symptoms and signs involving the musculoskeletal system: Secondary | ICD-10-CM | POA: Diagnosis not present

## 2018-09-20 NOTE — Patient Instructions (Signed)
FUNCTIONAL MOBILITY: Squat    Stance: shoulder-width on floor. Bend hips and knees. Keep back straight. Do not allow knees to bend past toes. Squeeze glutes and quads to stand. 10 reps per set, 2 sets per day, 3-4 days per week  Copyright  VHI. All rights reserved.   Toe / Heel Raise (Standing)    Standing with support, raise heels, then rock back on heels and raise toes. Repeat 15 times.  Copyright  VHI. All rights reserved.   SINGLE LIMB STANCE    Standing infront of counter/sink. Stance: single leg on floor. Raise leg. Hold 30 seconds. Repeat with other leg. 5reps per set, 2 sets per day, 3  days per week  Copyright  VHI. All rights reserved.
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
## 2018-09-24 ENCOUNTER — Ambulatory Visit (HOSPITAL_COMMUNITY): Payer: BC Managed Care – PPO

## 2018-09-24 ENCOUNTER — Encounter (HOSPITAL_COMMUNITY): Payer: Self-pay

## 2018-09-24 ENCOUNTER — Other Ambulatory Visit: Payer: Self-pay

## 2018-09-24 DIAGNOSIS — R29898 Other symptoms and signs involving the musculoskeletal system: Secondary | ICD-10-CM | POA: Diagnosis not present

## 2018-09-24 DIAGNOSIS — M6281 Muscle weakness (generalized): Secondary | ICD-10-CM | POA: Diagnosis not present

## 2018-09-24 DIAGNOSIS — R2689 Other abnormalities of gait and mobility: Secondary | ICD-10-CM | POA: Diagnosis not present
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
## 2018-09-25 ENCOUNTER — Ambulatory Visit (HOSPITAL_COMMUNITY): Payer: BC Managed Care – PPO

## 2018-10-01 ENCOUNTER — Other Ambulatory Visit: Payer: Self-pay

## 2018-10-01 ENCOUNTER — Ambulatory Visit (HOSPITAL_COMMUNITY): Payer: BC Managed Care – PPO

## 2018-10-01 ENCOUNTER — Encounter (HOSPITAL_COMMUNITY): Payer: Self-pay

## 2018-10-01 DIAGNOSIS — R2689 Other abnormalities of gait and mobility: Secondary | ICD-10-CM | POA: Diagnosis not present

## 2018-10-01 DIAGNOSIS — M6281 Muscle weakness (generalized): Secondary | ICD-10-CM

## 2018-10-01 DIAGNOSIS — R29898 Other symptoms and signs involving the musculoskeletal system: Secondary | ICD-10-CM
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
## 2018-10-02 ENCOUNTER — Telehealth (HOSPITAL_COMMUNITY): Payer: Self-pay

## 2018-10-02 ENCOUNTER — Ambulatory Visit (HOSPITAL_COMMUNITY): Payer: BC Managed Care – PPO

## 2018-10-02 NOTE — Telephone Encounter (Signed)
No Show#1. Unable to reach pt, so left voicemail regarding missed appointment today at 1:45. Educated pt on next appointment on 10/6 at 1:45 and educated pt to call clinic if unable to make that appointment; left clinic number on pt's voicemail.  Tori Dashun Borre PT, DPT 10/02/18, 2:03 PM [phone]

## 2018-10-03 DIAGNOSIS — G894 Chronic pain syndrome: Secondary | ICD-10-CM | POA: Diagnosis not present

## 2018-10-03 DIAGNOSIS — Z6824 Body mass index (BMI) 24.0-24.9, adult: Secondary | ICD-10-CM | POA: Diagnosis not present

## 2018-10-08 ENCOUNTER — Ambulatory Visit (HOSPITAL_COMMUNITY): Payer: BC Managed Care – PPO

## 2018-10-08 ENCOUNTER — Telehealth (HOSPITAL_COMMUNITY): Payer: Self-pay

## 2018-10-08 NOTE — Telephone Encounter (Signed)
pt called to cancel his appt due to he had hurt his back and he is in pain.

## 2018-10-10 ENCOUNTER — Ambulatory Visit (HOSPITAL_COMMUNITY): Payer: BC Managed Care – PPO

## 2018-10-10 ENCOUNTER — Telehealth (HOSPITAL_COMMUNITY): Payer: Self-pay | Admitting: Internal Medicine

## 2018-10-10 NOTE — Telephone Encounter (Signed)
10/10/18  pt left a message to cx today said that his back was still bothering him

## 2018-10-15 ENCOUNTER — Ambulatory Visit (HOSPITAL_COMMUNITY): Payer: BC Managed Care – PPO

## 2018-10-15 ENCOUNTER — Telehealth (HOSPITAL_COMMUNITY): Payer: Self-pay

## 2018-10-15 NOTE — Telephone Encounter (Signed)
pt called and lmonvm to cancel this appt did not leave a reason.

## 2018-10-15 NOTE — Telephone Encounter (Signed)
Called pt regarding missed appointment today at 1:45, pt's spouse answered the phone and reports the pt is asleep and he called and cancelled the appointment early today. Pt's spouse reports "he's been giving out a lot, he just don't feel good" and spouse wants to keep appointment Thursday 1:45 in hopes pt will be feeling better. Reminded spouse of appointment time Thursday, educated to call and cancel if they need and wished pt well. Received info from front office staff about pt leaving voicemail to cancel after completing call with pt's spouse.   Tori Brailyn Delman PT, DPT 10/15/18, 2:06 PM [phone]

## 2018-10-17 ENCOUNTER — Other Ambulatory Visit: Payer: Self-pay

## 2018-10-17 ENCOUNTER — Ambulatory Visit (HOSPITAL_COMMUNITY): Payer: BC Managed Care – PPO | Attending: Adult Health

## 2018-10-17 ENCOUNTER — Encounter (HOSPITAL_COMMUNITY): Payer: Self-pay

## 2018-10-17 DIAGNOSIS — R2689 Other abnormalities of gait and mobility: Secondary | ICD-10-CM

## 2018-10-17 DIAGNOSIS — M6281 Muscle weakness (generalized): Secondary | ICD-10-CM | POA: Diagnosis not present
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
## 2018-11-14 ENCOUNTER — Other Ambulatory Visit: Payer: Self-pay | Admitting: Adult Health

## 2018-11-14 MED ORDER — CLOPIDOGREL BISULFATE 75 MG PO TABS: 75.0000 mg | ORAL_TABLET | Freq: Every day | ORAL | 1 refills | Status: DC

## 2018-11-14 NOTE — Telephone Encounter (Signed)
Plavix refilled x 2 months.. Patient has a FU in Dec.

## 2018-11-14 NOTE — Telephone Encounter (Signed)
Pt is requesting a refill of clopidogrel (PLAVIX) 75 MG tablet , to be sent to [facility], [place] [address] [place]. HARRISON S

## 2018-12-02 DIAGNOSIS — Z6825 Body mass index (BMI) 25.0-25.9, adult: Secondary | ICD-10-CM | POA: Diagnosis not present

## 2018-12-02 DIAGNOSIS — E663 Overweight: Secondary | ICD-10-CM | POA: Diagnosis not present

## 2018-12-02 DIAGNOSIS — G894 Chronic pain syndrome: Secondary | ICD-10-CM | POA: Diagnosis not present

## 2018-12-02 DIAGNOSIS — M545 Low back pain: Secondary | ICD-10-CM | POA: Diagnosis not present

## 2018-12-05 ENCOUNTER — Other Ambulatory Visit: Payer: Self-pay

## 2018-12-05 ENCOUNTER — Ambulatory Visit (INDEPENDENT_AMBULATORY_CARE_PROVIDER_SITE_OTHER): Payer: BC Managed Care – PPO | Admitting: Otolaryngology

## 2018-12-10 ENCOUNTER — Other Ambulatory Visit: Payer: Self-pay | Admitting: Otolaryngology

## 2018-12-10 ENCOUNTER — Other Ambulatory Visit (HOSPITAL_COMMUNITY): Payer: Self-pay | Admitting: Otolaryngology

## 2018-12-10 DIAGNOSIS — J387 Other diseases of larynx: Secondary | ICD-10-CM

## 2018-12-17 ENCOUNTER — Telehealth: Payer: Self-pay | Admitting: Adult Health

## 2018-12-17 NOTE — Telephone Encounter (Signed)
Pt is needing a refill on his amLODipine (NORVASC) 5 MG tablet sent to the [facility] on [address].

## 2018-12-17 NOTE — Telephone Encounter (Signed)
I called and spoke to wife.  Pt has been out for 2 wks.  She is worried.  I relayed that she needs to call Dr. Gerarda Fraction office , pcp to fill Bp meds for him.  I told he that JM/NP will refill when in to see her but relays to f/u with pcp for future refills. She said she will call.

## 2018-12-18 ENCOUNTER — Ambulatory Visit: Payer: BC Managed Care – PPO | Admitting: Adult Health

## 2018-12-18 NOTE — Telephone Encounter (Signed)
I called pts wife and LMVM to return call.  I called Dr. Gerarda Fraction office and pt has appt 01-01-19. Not sure what meds he is taking for Bp or what pharmacy [facility] did not have recent meds for Bp (this was from 07/2017 (amlodipine and metoprolol).

## 2018-12-18 NOTE — Telephone Encounter (Signed)
Pt's wife called stating that the PCP informed her that they can not fill this prescription for her and to call us back about it. Please advise.

## 2018-12-19 MED ORDER — AMLODIPINE BESYLATE 5 MG PO TABS: 5.0000 mg | ORAL_TABLET | Freq: Every day | ORAL | 0 refills | Status: DC

## 2018-12-19 NOTE — Telephone Encounter (Signed)
LMVM for pts wife, [name] to return call.

## 2018-12-19 NOTE — Telephone Encounter (Signed)
I called pts wife.  I relayed that did send in prescription for amlodipine 5mg  po daily 30 day supply for he has appt in 01-01-19 with pcp.  She verbalized understanding.

## 2018-12-19 NOTE — Addendum Note (Signed)
Addended by: Brandon Melnick on: 12/19/2018 10:46 AM   Modules accepted: Orders

## 2018-12-19 NOTE — Telephone Encounter (Signed)
Okay to refill until he has follow-up with PCP.  It appears as though he has missed prior follow-up visits due to illness.  It will be requested for PCP to manage/monitor blood pressure and antihypertensives in the future

## 2018-12-24 ENCOUNTER — Ambulatory Visit: Payer: BC Managed Care – PPO | Admitting: Adult Health

## 2018-12-24 ENCOUNTER — Other Ambulatory Visit: Payer: Self-pay

## 2018-12-24 ENCOUNTER — Encounter: Payer: Self-pay | Admitting: Adult Health

## 2018-12-24 VITALS — BP 153/97 | HR 54 | Temp 96.6°F | Ht 72.0 in | Wt 181.4 lb

## 2018-12-24 DIAGNOSIS — I63533 Cerebral infarction due to unspecified occlusion or stenosis of bilateral posterior cerebral arteries: Secondary | ICD-10-CM

## 2018-12-24 DIAGNOSIS — E785 Hyperlipidemia, unspecified: Secondary | ICD-10-CM

## 2018-12-24 DIAGNOSIS — I10 Essential (primary) hypertension: Secondary | ICD-10-CM

## 2018-12-24 DIAGNOSIS — I6521 Occlusion and stenosis of right carotid artery: Secondary | ICD-10-CM | POA: Diagnosis not present

## 2018-12-24 DIAGNOSIS — I6503 Occlusion and stenosis of bilateral vertebral arteries: Secondary | ICD-10-CM | POA: Diagnosis not present

## 2018-12-24 MED ORDER — CLOPIDOGREL BISULFATE 75 MG PO TABS: 75.0000 mg | ORAL_TABLET | Freq: Every day | ORAL | 3 refills | Status: DC

## 2018-12-24 NOTE — Patient Instructions (Signed)
Continue clopidogrel 75 mg daily  and Lipitor for secondary stroke prevention -will provide refills at this time  Orders will be placed for CTA head and neck due to prior occlusion -if any concern regarding known occlusion or any worsening, we will reach out to vascular surgery in regards for need of follow-up  Continue to follow up with PCP regarding cholesterol and blood pressure management   Continue to monitor blood pressure at home  Maintain strict control of hypertension with blood pressure goal below 130/90, diabetes with hemoglobin A1c goal below 6.5% and cholesterol with LDL cholesterol (bad cholesterol) goal below 70 mg/dL. I also advised the patient to eat a healthy diet with plenty of whole grains, cereals, fruits and vegetables, exercise regularly and maintain ideal body weight.  Followup in the future with me in 6 months or call earlier if needed       Thank you for coming to see [place] at [facility]. I hope we have been able to provide you high quality care today.  You may receive a patient satisfaction survey over the next few weeks. We would appreciate your feedback and comments so that we may continue to improve ourselves and the health of our patients.

## 2018-12-24 NOTE — Progress Notes (Signed)
I agree with the above plan 

## 2018-12-24 NOTE — Progress Notes (Signed)
[facility] [address] [place]. [place] [address] [phone]       OFFICE FOLLOW UP NOTE  Mr. NEEKO PHARO Date of Birth:  [birth year] Medical Record Number:  [id number]   Reason for Referral: [place] stroke follow up    CHIEF COMPLAINT:  Chief Complaint  Patient presents with  . Follow-up    3 mon f/u. Alone. Rm 9. No new concerns at this time.     HPI: Stroke admission 07/31/2018: Mr. CLESTER CHLEBOWSKI is a 58 y.o. male with history of chronic LBP, HTN, HLD, CAD presented to [facility] ED on 07/31/2018 with blurred vision, ataxia, incoordination, and weakness in legs.  Transferred to [facility] for further evaluation/management.  MRI showed R > L pontine infarcts.  MRA showed occlusion bilateral VA's.  CTA head/neck showed bilateral V4 stenosis with occlusion distal VA and BA distally, right ICA 70% and left ICA 65% narrowing, distal left ICA 60% at cervical petrous junction, and severe ICA atherosclerosis, left CCA 40 to 50% stenosis, right VA 50% stenosis, bilateral V2 60 to 70% stenosis and left subclavian 65% stenosis.  Underwent cerebral angiogram by Dr. 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  No further interventions recommended at that time and recommended follow-up with vascular surgery outpatient.  2D echo normal EF with mild LVH and diastolic dysfunction with trivial aortic regurgitation.  LDL 101 and A1c 5.4.  UDS positive for opiates, benzos and THC.  Recommended DAPT for 3 months then Plavix alone due to intracranial stenosis.  BP as high as 244/114 during admission and recommended long-term BP goal 1 30-1 60 given severe vascular stenosis.  Recommended continuation of atorvastatin 80 mg daily.  Heavy EtOH use and substance use with cessation counseling provided.  Other stroke risk factors include former tobacco use, family history of stroke and CAD s/p DES 2014.  He was discharged home in stable  condition without therapy needs as he returned to baseline.  Initial visit 09/10/2018: Mr. Silverio is being seen today for hospital follow-up accompanied by his wife.  Residual deficits of mild blurring of vision, dizziness, balance difficulties and weakness in both legs.  He does endorse overall improvement but does continue to have difficulties with daily functioning.  Therapy was not recommended at discharge.  He has not returned to work as he was previously working as an Clinical biochemist and currently in the process of applying for [employer] disability due to difficulties with ambulation and intolerance to heat.  He continues on aspirin 325 mg daily and clopidogrel 75 mg daily without bleeding or bruising.  Continues on atorvastatin 80 mg daily without myalgias.  Blood pressure today 166/94.  He does monitor at home and typically around 140s. He is questioning undergoing direct laryngoscopy with biopsy of laryngeal mass which is currently scheduled on 10/01/2018.  Per patient report, ongoing lymph node issues for the past 6-7 months with undergoing laryngoscopy for further evaluation.  Ongoing alcohol use approximately 4-6 beers daily and occasional THC use which wife reports he uses for self-medicating for anxiety or stress. Denies new or worsening stroke/TIA symptoms.  Update 12/24/2018: Mr. Sowles is a 58 year old male who is being seen today for stroke follow-up.  Residual deficits of occasional balance difficulties and occasional visual impairment.  He has since completed PT with improvement of balance.  He does endorse ongoing left hip pain with recommendation of surgery but patient declined as there is a chance he  can be paralyzed.  He recently obtained with glasses which have helped stabilize vision.  He has completed recommended 66-month DAPT and continues on Plavix alone with mild bruising but no bleeding.  Continues on atorvastatin 80 mg daily without myalgias.  Blood pressure today 153/97.   Vascular surgery recommended repeat carotid ultrasound 6 months post discharge.  Denies new or worsening stroke/TIA symptoms.    ROS:   All 14 systems reviewed with patient concerns of the following and all other negative blurred vision,  easy bruising, pain, balance issues    PMH:  Past Medical History:  Diagnosis Date  . Anxiety   . Arthritis   . Bradycardia    a. During 11/2012 adm: lopressor decreased.  . 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 Coronary artery disease    a. Diag cath 10/2012 for CP/abnormal nuc -> planned PCI s/p LAD atherectomy/DES placement 11/05/12.  Marland Kitchen GERD (gastroesophageal reflux disease)   . Hypercholesteremia   . Hypertension     PSH:  Past Surgical History:  Procedure Laterality Date  . CARDIAC CATHETERIZATION  10/29/2012  . CORONARY ANGIOPLASTY WITH STENT PLACEMENT  11/05/2012  . HEMORRHOID SURGERY  ~ 2010  . INSERTION OF MESH N/A 06/02/2015   Procedure: INSERTION OF MESH;  Surgeon: [name], MD;  Location: [facility];  Service: General;  Laterality: N/A;  . IR ANGIO INTRA EXTRACRAN SEL COM CAROTID INNOMINATE BILAT MOD SED  08/02/2018  . IR ANGIO VERTEBRAL SEL VERTEBRAL BILAT MOD SED  08/02/2018  . LEFT HEART CATHETERIZATION WITH CORONARY ANGIOGRAM N/A 10/30/2012   Procedure: LEFT HEART CATHETERIZATION WITH CORONARY ANGIOGRAM;  Surgeon: [name], MD;  Location: [facility] CATH [facility];  Service: Cardiovascular;  Laterality: N/A;  . PERCUTANEOUS CORONARY ROTOBLATOR INTERVENTION (PCI-R) N/A 11/05/2012   Procedure: PERCUTANEOUS CORONARY ROTOBLATOR INTERVENTION (PCI-R);  Surgeon: [name], MD;  Location: [facility] CATH LAB;  Service: Cardiovascular;  Laterality: N/A;  . UMBILICAL HERNIA REPAIR N/A 06/02/2015   Procedure: UMBILICAL HERNIORRHAPHY WITH MESH;  Surgeon: [name], MD;  Location: [facility];  Service: General;  Laterality: N/A;    Social History:  Social History    Socioeconomic History  . Marital status: Married    Spouse name: Not on file  . Number of children: Not on file  . Years of education: Not on file  . Highest education level: Not on file  Occupational History  . Not on file  Tobacco Use  . Smoking status: Former Smoker    Packs/day: 3.00    Years: 32.00    Pack years: 96.00    Types: Cigarettes    Start date: 08/29/1977    Quit date: 11/29/2006    Years since quitting: 12.0  . Smokeless tobacco: Former Systems developer    Types: Chew  . Tobacco comment: 11/05/2012 "chewed tobacco when I play ball; aien't chewed since age 26"  Substance and Sexual Activity  . Alcohol use: Yes    Alcohol/week: 0.0 standard drinks    Comment: "couple of beers daily"  . Drug use: No  . Sexual activity: Not Currently  Other Topics Concern  . Not on file  Social History Narrative  . Not on file   Social Determinants of Health   Financial Resource Strain:   . Difficulty of Paying Living Expenses: Not on file  Food Insecurity:   . Worried About Charity fundraiser in the Last  Year: Not on file  . Ran Out of Food in the Last Year: Not on file  Transportation Needs:   . Lack of Transportation (Medical): Not on file  . Lack of Transportation (Non-Medical): Not on file  Physical Activity:   . Days of Exercise per Week: Not on file  . Minutes of Exercise per Session: Not on file  Stress:   . Feeling of Stress : Not on file  Social Connections:   . Frequency of Communication with Friends and Family: Not on file  . Frequency of Social Gatherings with Friends and Family: Not on file  . Attends Religious Services: Not on file  . Active Member of Clubs or Organizations: Not on file  . Attends Archivist Meetings: Not on file  . Marital Status: Not on file  Intimate Partner Violence:   . Fear of Current or Ex-Partner: Not on file  . Emotionally Abused: Not on file  . Physically Abused: Not on file  . Sexually Abused: Not on file    Family  History:  Family History  Problem Relation Age of Onset  . Heart disease Father   . Heart disease Sister     Medications:   Current Outpatient Medications on File Prior to Visit  Medication Sig Dispense Refill  . amLODipine (NORVASC) 5 MG tablet Take 1 tablet (5 mg total) by mouth daily. 30 tablet 0  . atorvastatin (LIPITOR) 80 MG tablet Take 1 tablet (80 mg total) by mouth every evening. 90 tablet 3  . clopidogrel (PLAVIX) 75 MG tablet Take 1 tablet (75 mg total) by mouth daily. 30 tablet 1  . HYDROcodone-acetaminophen (NORCO) 10-325 MG tablet Take 1 tablet by mouth every 4 (four) hours as needed. 50 tablet 0  . lisinopril (ZESTRIL) 20 MG tablet Take 1 tablet (20 mg total) by mouth daily. 30 tablet 0  . metoprolol tartrate (LOPRESSOR) 25 MG tablet TAKE 1/2 TABLET BY MOUTH TWICE DAILY 30 tablet 3  . nitroGLYCERIN (NITROSTAT) 0.4 MG SL tablet Place 1 tablet (0.4 mg total) under the tongue every 5 (five) minutes as needed for chest pain (CP or SOB). 25 tablet 3  . omeprazole (PRILOSEC) 20 MG capsule Take 20 mg by mouth daily.      No current facility-administered medications on file prior to visit.    Allergies:   Allergies  Allergen Reactions  . Prednisone Other (See Comments)    Chest pain     Physical Exam  Vitals:   12/24/18 0830  BP: (!) 153/97  Pulse: (!) 54  Temp: (!) 96.6 F (35.9 C)  TempSrc: Oral  Weight: 181 lb 6.4 oz (82.3 kg)  Height: 6' (1.829 m)   Body mass index is 24.6 kg/m. No exam data present  No flowsheet data found.   General: Elderly-appearing Caucasian male, seated, in no evident distress Head: head normocephalic and atraumatic.   Neck: supple with no carotid or supraclavicular bruits Cardiovascular: regular rate and rhythm, no murmurs Musculoskeletal: no deformity; LLE hip flexor weakness due to pain Skin:  no rash/petichiae Vascular:  Normal pulses all extremities   Neurologic Exam Mental Status: Awake and fully alert. Oriented to place  and time. Recent and remote memory intact. Attention span, concentration and fund of knowledge appropriate. Mood appropriate and flat affect. Cranial Nerves: Pupils equal, briskly reactive to light. Extraocular movements full without nystagmus. Visual fields full to confrontation. Hearing intact. Facial sensation intact. Face, tongue, palate moves normally and symmetrically.  Motor: Normal bulk and  tone. Normal strength in all tested extremity muscles except mild left hip flexor weakness due to ongoing hip pain and lumbar spinal condition Sensory.: intact to touch , pinprick , position and vibratory sensation.  Coordination: Rapid alternating movements normal in all extremities. Finger-to-nose performed accurately bilaterally and heel-to-shin performed accurately on right side and difficulty with left side due to hip pain. Gait and Station: Arises from chair without difficulty. Stance is normal. Gait demonstrates  mild favoring of left leg but is able to ambulate without assistive device.  Mild difficulty with tandem gait Reflexes: 1+ and symmetric. Toes downgoing.      Diagnostic Data (Labs, Imaging, Testing)  CT HEAD WO CONTRAST 07/31/2018 IMPRESSION: Atrophy with small vessel chronic ischemic changes of deep cerebral white matter. No acute intracranial abnormalities. Extensive atherosclerotic calcifications at the skull base particularly of the internal carotid arteries.  CT ANGIO HEAD W OR WO CONTRAST CT ANGIO NECK W OR WO CONTRAST 08/01/2018 IMPRESSION: 1. 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 Basilar tip is perfused via small bilateral posterior communicating arteries, with perfusion of the bilateral PCAs and SCAs. 2. Atheromatous stenoses about the carotid bifurcations/proximal ICAs bilaterally, with up to 70% narrowing on the right and 65% narrowing on the left. Additional approximate 60% stenosis involving the  distal left ICA at the cervical-petrous junction. 3. Severe atheromatous plaque throughout the petrous, cavernous, and supraclinoid ICAs bilaterally with associated moderate to severe multifocal stenoses. 4. Scattered atheromatous plaque within the left CCA with up to 40-50% stenosis. 5. Approximate 50% stenosis at the origin of the right vertebral artery, with additional 60-70% stenoses involving the bilateral V2 segments downstream as above. 6. 65% atheromatous stenosis at the origin of the left subclavian artery. 7. Faint linear lucency extending through the C2 vertebral body, favored to reflect a benign nutrient foramen. If there is a history of trauma or high clinical suspicion for possible cervical spine injury, then further assessment with dedicated cervical spine CT would be recommended.  MR BRAIN WO CONTRAST MR MRA HEAD  07/31/2018 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 Both posteroinferior cerebellar arteries receive flow. Patent posterior communicating arteries on each side reconstitute the basilar tip and give supply to the superior cerebellar and posterior cerebral arteries.  CEREBRAL ANGIOGRAM 08/02/2018 IMPRESSION: Angiographically occluded bilateral vertebrobasilar junctions just distal to the posteroinferior cerebellar arteries. Approximately 65% stenosis of the right internal carotid artery just distal to the bulb. Moderate arteriosclerotic narrowing of the internal carotid arteries in the petrous cervical region, and also the petrous cavernous regions bilaterally. Retrograde opacification of the basilar artery terminus via the right posterior communicating artery via the right internal carotid artery as  described.  ECHOCARDIOGRAM 08/01/2018 IMPRESSIONS  1. The left ventricle has normal systolic function, with an ejection fraction of 55-60%. The cavity size was normal. There is mildly increased left ventricular wall thickness. Left ventricular diastolic Doppler parameters are consistent with impaired  relaxation.  2. The right ventricle has normal systolic function. The cavity was normal. There is no increase in right ventricular wall thickness. Right ventricular systolic pressure could not be assessed.  3. Aortic valve regurgitation is trivial by color flow Doppler. No stenosis of the aortic valve.  4. No intracardiac thrombi or masses were visualized.  5. The interatrial septum  was not well visualized.  6. The inferior vena cava was normal in size with <50% respiratory variability    ASSESSMENT: ADAEL CULBREATH is a 58 y.o. 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 Vascular risk factors include uncontrolled HTN, HLD, CAD, intra-and extracranial stenosis heavy EtOH use and substance abuse.  Residual deficits of intermittent blurred vision and occasional imbalance.  Continues to have left hip pain due to lumbar spinal condition and nerve compression is left hip flexor weakness    PLAN:  1. Bilateral pontine infarcts: Continue clopidogrel 75 mg daily  and atorvastatin for secondary stroke prevention.  Refill placed for clopidogrel but request ongoing prescribing by PCP as he will continue clopidogrel long-term.  Maintain strict control of hypertension with blood pressure goal between 130-160, diabetes with hemoglobin A1c goal below 6.5% and cholesterol with LDL cholesterol (bad cholesterol) goal below 70 mg/dL.  I also advised the patient to eat a healthy diet with plenty of whole grains, cereals, fruits and vegetables,  exercise regularly with at least 30 minutes of continuous activity daily and maintain ideal body weight. 2. HTN: Advised to continue current treatment regimen.  Advised to continue to monitor at home along with continued follow-up with PCP for management 3. HLD: Advised to continue current treatment regimen along with continued follow-up with PCP for future prescribing and monitoring of lipid panel.  Repeat lipid panel at today's visit 4. Intra-and extracranial stenosis: 65-month DAPT completed and continues on Plavix and atorvastatin.  Blood pressure goal between 1 30-1 60 to ensure adequate perfusion.  Discussion regarding undergoing imaging as he has not been contacted by vascular surgery but after further review of chart, it was recommended he undergo carotid ultrasound 6 months post hospitalization which would be next month.  Orders initially placed for CTA head/neck but will be canceled and will advise patient that he should be contacted by vascular surgery    Follow up in 6 months or call earlier if needed   Greater than 50% of time during this 25 minute visit was spent on counseling, explanation of diagnosis of bilateral pontine infarcts, reviewing risk factor management of HTN, HLD, intracranial and extracranial stenosis, planning of further management along with potential future management, and discussion with patient answering all questions to [place], AGNP-BC  [facility] [address] [place], [place] [address]  Phone [phone] Fax [phone] Note: This document was prepared with digital dictation and possible smart phrase technology. Any transcriptional errors that result from this process are unintentional.

## 2018-12-25 LAB — LIPID PANEL
Chol/HDL Ratio: 2.1 ratio (ref 0.0–5.0)
Cholesterol, Total: 167 mg/dL (ref 100–199)
HDL: 80 mg/dL (ref >39)
LDL Chol Calc (NIH): 74 mg/dL (ref 0–99)
Triglycerides: 67 mg/dL (ref 0–149)
VLDL Cholesterol Cal: 13 mg/dL (ref 5–40)

## 2018-12-25 LAB — BUN+CREAT
BUN/Creatinine Ratio: 12 (ref 9–20)
BUN: 13 mg/dL (ref 6–24)
Creatinine, Ser: 1.05 mg/dL (ref 0.76–1.27)
GFR calc Af Amer: 91 mL/min/{1.73_m2} (ref >59)
GFR calc non Af Amer: 78 mL/min/{1.73_m2} (ref >59)

## 2018-12-26 ENCOUNTER — Other Ambulatory Visit: Payer: Self-pay

## 2018-12-26 ENCOUNTER — Ambulatory Visit (HOSPITAL_COMMUNITY)
Admission: RE | Admit: 2018-12-26 | Discharge: 2018-12-26 | Disposition: A | Discharge status: Home / Self Care | Payer: BC Managed Care – PPO | Source: Ambulatory Visit | Attending: Otolaryngology | Admitting: Otolaryngology

## 2018-12-26 DIAGNOSIS — J387 Other diseases of larynx: Secondary | ICD-10-CM | POA: Insufficient documentation

## 2018-12-26 MED ORDER — IOHEXOL 300 MG/ML  SOLN
75.0000 mL | Freq: Once | INTRAMUSCULAR | Status: AC | PRN
  Administered 2018-12-26: 75 mL via INTRAVENOUS

## 2018-12-30 ENCOUNTER — Encounter: Payer: Self-pay | Admitting: *Deleted

## 2018-12-30 ENCOUNTER — Other Ambulatory Visit: Payer: Self-pay | Admitting: Otolaryngology

## 2018-12-30 DIAGNOSIS — Z0271 Encounter for disability determination: Secondary | ICD-10-CM

## 2018-12-31 ENCOUNTER — Encounter (HOSPITAL_COMMUNITY): Payer: Self-pay | Admitting: Otolaryngology

## 2018-12-31 ENCOUNTER — Other Ambulatory Visit: Payer: Self-pay

## 2018-12-31 NOTE — Progress Notes (Addendum)
Mr [name] denies chest pain or shortness of breath. Mr [name] is scheduled for Covid test 01/01/2019, patient denies any s/s of Covid for him or anyone in his house. Mr [name] had a stroke 07/31/2018.  Mr [name] said that his left side is not as strong as it was before.  I asked Mr [name]  about balance difficulty that was noted at neurology visit. Patient said that the balance issue is from hip pain. Mr [name] is on Aspirin and Plaxix and did not receive any instructions about stopping them.  I spoke with [name] at Dr Deeann Saint office.  [name] said that she had spoken with Mrs. [name], Dr Benjamine Mola said that the it is a small biopsy and patient does not need to stop blood thinner. I called Mr [name] back and told him to continue Aspirin and Plavix.  I asked anesthesia PA-C to review.

## 2019-01-01 ENCOUNTER — Encounter (HOSPITAL_COMMUNITY): Payer: Self-pay | Admitting: Otolaryngology

## 2019-01-01 ENCOUNTER — Other Ambulatory Visit (HOSPITAL_COMMUNITY)
Admission: RE | Admit: 2019-01-01 | Discharge: 2019-01-01 | Disposition: A | Discharge status: Home / Self Care | Payer: BC Managed Care – PPO | Source: Ambulatory Visit | Attending: Otolaryngology | Admitting: Otolaryngology

## 2019-01-01 ENCOUNTER — Other Ambulatory Visit: Payer: Self-pay

## 2019-01-01 DIAGNOSIS — Z20828 Contact with and (suspected) exposure to other viral communicable diseases: Secondary | ICD-10-CM | POA: Insufficient documentation

## 2019-01-01 DIAGNOSIS — Z01812 Encounter for preprocedural laboratory examination: Secondary | ICD-10-CM | POA: Insufficient documentation

## 2019-01-01 LAB — SARS CORONAVIRUS 2 (TAT 6-24 HRS): SARS Coronavirus 2: NEGATIVE

## 2019-01-01 NOTE — Progress Notes (Signed)
Anesthesia Chart Review: [name]   Case: [id number] Date/Time: 01/02/19 1145   Procedures:      MICRO DIRECT LARYNGOSCOPY (N/A )     NECK MASS BIOPSY (N/A )   Anesthesia type: General   Pre-op diagnosis: LARINGEAL MASS   Location: [facility] OR ROOM 08 / [place] OR   Surgeons: [name], MD      DISCUSSION: Patient is a 58 year old male scheduled for the above procedure. 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  Tissue biopsy is needed for definitive diagnosis.   History includes former smoker (quit 2008), CAD (s/p DES LAD 11/05/12), bradycardia, HTN, hypercholesterolemia, GERD, chronic low back pain, CVA (07/31/18, presented with blurred vision, ataxia, leg weakness, MRI R > L pontine infarct, MRA bilateral vertebral artery occlusion), carotid artery stenosis (65% RICA, mild-moderate left carotid bulb stenosis 08/02/18 angiography; six month f/u rec), umbilical hernia repair 4/40/34.  He was doing well from a cardiac standpoint at 08/2017 cardiology visit. Next visit is scheduled for 02/2019. His 07/31/18 EKG at the time of CVA admission showed ST elevation in inferior leads, but this has been present on other EKGs. Troponin negative x2. EF normal by echo. He denied chest pain and SOB per PAT RN phone interview. RN confirmed with Dr. Benjamine Mola that patient could continue ASA and Plavix for this procedure. (He is on Plavix for CVA history). Lasts seen by neurology earlier this month. He is due for follow-up carotid imaging ~ 02/02/19.  01/01/19 COVID-19 test is in process. He is a same day work-up, so anesthesia team to evaluate on the day of surgery.    VS: Ht 6' (1.829 m)   Wt 82.1 kg   BMI 24.55 kg/m  BP Readings from Last 3 Encounters:  12/24/18 (!) 153/97  09/10/18 (!) 166/94  08/03/18 132/70   Pulse Readings from Last 3 Encounters:  12/24/18 (!) 54  09/10/18 69  08/03/18 68    PROVIDERS: [name], MD  is PCP  - Kate Sable, MD is cardiologist. Last visit 08/30/17. Patient without CV symptoms and staying active playing basketball and softball and still working at that time. Continue medical therapy recommended with one year follow-up. (He has since had a CVA 07/31/18 which has impacted his activity.) Next visit is scheduled for 02/11/19 with Bernerd Pho, Georgana Curio, MD is neurologist. Last visit 12/24/18 with Frann Rider, NP. Overall stroke symptoms improved, but still with some residual intermittent blurred vision and occasional imbalance. Recommended vascular surgery follow-up for ICA stenosis (due ~ 02/2019; Although could initially have surveillance ultrasounds at [facility]).       LABS: He is for updated labs on arrival. As of 12/24/18, lab results included: Lab Results  Component Value Date   WBC 7.9 08/02/2018   HGB 14.6 08/02/2018   HCT 41.3 08/02/2018   PLT 250 08/02/2018   GLUCOSE 105 (H) 08/03/2018   CHOL 167 12/24/2018   TRIG 67 12/24/2018   HDL 80 12/24/2018   LDLCALC 74 12/24/2018   ALT 12 07/31/2018   AST 19 07/31/2018   NA 128 (L) 08/03/2018   K 3.8 08/03/2018   CL 95 (L) 08/03/2018   CREATININE 1.05 12/24/2018   BUN 13 12/24/2018   CO2 23 08/03/2018   INR 1.0 08/02/2018   HGBA1C 5.4 08/01/2018      IMAGES: CT soft tissue neck 12/26/18: IMPRESSION: 1. Asymmetric soft tissue mass lesion within the left  aryepiglottic fold extending to the supraglottic larynx measures at least 1.7 x 1.2 x 0.7 cm. 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 2. Enlarged hyperdense left level 2 lymph nodes are concerning for malignancy. 3. Bilateral hypodense enlarged level 4 lymph nodes are less likely to be related to the squamous cell carcinoma. PET scan would be useful for further evaluation of these nodes and the presumed primary lesion. These nodes raise concern for a second malignancy. 4. High-grade stenosis of the proximal left  common carotid artery. 5. High-grade stenosis of the proximal right internal carotid artery. 6. Aortic Atherosclerosis (ICD10-I70.0) and Emphysema (ICD10-J43.9).  CXR 07/31/18: FINDINGS: There is no appreciable edema or consolidation. Heart size and pulmonary vascularity are normal. No adenopathy. No bone lesions. IMPRESSION: No edema or consolidation.  Stable cardiac silhouette.   EKG: EKG 07/31/18: Confirmed by ED provider as junction rhythm, non-specific T wave abnormalities in anterolateral leads, ST elevation, consider inferior injury.  - However, I believe baseline rhythm is Sinus Bradycardia as P waves are present with PR interval 140 ms. (P wave are particularly noted in V1-4, I, but are more flat in aVR, II and avF). In regards to inferior ST elevation, consider injury--high sensitivity troponin I normal x2. Although inferior ST elevation less prominent on 08/30/17 and 7/31/8 tracings, it is similar to 05/28/15 preoperative EKG (read as early repolarization), 12/08/13 EKG, and 11/15/12 EKG (read as "Sinus  Bradycardia -Inferolateral ST-elevation -possible repolarization variant -consider injury. PROBABLY NORMAL"). 08/01/18 echo showed normal LVEF.   CV: Bilateral common carotid and innominate angiography 08/02/18:  IMPRESSION: - Angiographically occluded bilateral vertebrobasilar junctions just distal to the posteroinferior cerebellar arteries. - Approximately 65% stenosis of the right internal carotid artery just distal to the bulb. - Moderate arteriosclerotic narrowing of the internal carotid arteries in the petrous cervical region, and also the petrous cavernous regions bilaterally. - Retrograde opacification of the basilar artery terminus via the right posterior communicating artery via the right internal carotid artery as described. PLAN: Follow-up ultrasound of the carotid arteries in 6 months time.   Echo 08/01/18: IMPRESSIONS  1. The left ventricle has normal systolic  function, with an ejection fraction of 55-60%. The cavity size was normal. There is mildly increased left ventricular wall thickness. Left ventricular diastolic Doppler parameters are consistent with impaired  relaxation.  2. The right ventricle has normal systolic function. The cavity was normal. There is no increase in right ventricular wall thickness. Right ventricular systolic pressure could not be assessed.  3. Aortic valve regurgitation is trivial by color flow Doppler. No stenosis of the aortic valve.  4. No intracardiac thrombi or masses were visualized.  5. The interatrial septum was not well visualized.  6. The inferior vena cava was normal in size with <50% respiratory variability.   Cardiac cath 10/30/12:  Coronary angiography: Coronary dominance: right  Left Main:  Mildly calcified with no significant disease.  Left Anterior Descending (LAD):  Severely calcified proximally with 80-90% heavily calcified eccentric lesion and proximally at the origin of the large diagonal branch. There is a 40% mid stenosis. There is of the vessel has minor irregularities.  1st diagonal (D1):  Large in size with 40% ostial stenosis.  2nd diagonal (D2):  Very small in size.  3rd diagonal (D3):  Very small in size.  Circumflex (LCx):  Normal in size and moderately calcified proximally. There is diffuse 40% disease in the midsegment.  1st obtuse marginal:  Normal in size with no significant disease.  2nd obtuse marginal:  Normal in size with no significant disease.  3rd obtuse marginal:  Small in size with minor irregularities.   Right Coronary Artery: Medium in size and significantly calcified proximally. 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 There is a 40% disease in the mid RCA.  Posterior descending artery: This is mostly supplied by a large RV branch.  Posterior AV segment: Small in size with minor irregularities.  Posterolateral branchs:  Small in size  branches. Left ventriculography: Left ventricular systolic function is low normal , LVEF is estimated at 50 %, there is no significant mitral regurgitation . Mild anterior wall hypokinesis. Final Conclusions:   1. Severe heavily calcified proximal LAD stenosis at the origin of a large diagonal branch with moderate disease in the right coronary artery. 2. Low normal LV systolic function and mildly elevated left ventricular end-diastolic pressure.  Recommendations:  PCI with atherectomy of the LAD versus CABG. This will be discussed with the patient and family. PCI 11/05/12: Rotational atherectomy of the proximal LAD , PTCA and stenting of the proximal LAD (Stent 3.0 x 28 mm Promus drug-eluting stent). Post stent 0% stenosis, TIMI-flow 3.   Past Medical History:  Diagnosis Date  . Anxiety   . Arthritis    back  . Bradycardia    a. During 11/2012 adm: lopressor decreased.  . Carotid artery stenosis    65% RICA, mild-moderate L caroitd bulb 08/02/18 angiography, six month f/u rec  . 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 Coronary artery disease    a. Diag cath 10/2012 for CP/abnormal nuc -> planned PCI s/p LAD atherectomy/DES placement 11/05/12.  Marland Kitchen GERD (gastroesophageal reflux disease)   . Hypercholesteremia   . Hypertension   . Stroke ([place]) 07/31/2018   bilateral vertebrobasilar occlusion; "left side weaker thanit was before."    Past Surgical History:  Procedure Laterality Date  . CARDIAC CATHETERIZATION  10/29/2012  . CORONARY ANGIOPLASTY WITH STENT PLACEMENT  11/05/2012  . HEMORRHOID SURGERY  ~ 2010  . INSERTION OF MESH N/A 06/02/2015   Procedure: INSERTION OF MESH;  Surgeon: [name], MD;  Location: [facility];  Service: General;  Laterality: N/A;  . IR ANGIO INTRA EXTRACRAN SEL COM CAROTID INNOMINATE BILAT MOD SED  08/02/2018  . IR ANGIO VERTEBRAL SEL VERTEBRAL BILAT MOD SED  08/02/2018  . LEFT HEART  CATHETERIZATION WITH CORONARY ANGIOGRAM N/A 10/30/2012   Procedure: LEFT HEART CATHETERIZATION WITH CORONARY ANGIOGRAM;  Surgeon: [name], MD;  Location: [place] CATH LAB;  Service: Cardiovascular;  Laterality: N/A;  . PERCUTANEOUS CORONARY ROTOBLATOR INTERVENTION (PCI-R) N/A 11/05/2012   Procedure: PERCUTANEOUS CORONARY ROTOBLATOR INTERVENTION (PCI-R);  Surgeon: [name], MD;  Location: [facility] CATH LAB;  Service: Cardiovascular;  Laterality: N/A;  . UMBILICAL HERNIA REPAIR N/A 06/02/2015   Procedure: UMBILICAL HERNIORRHAPHY WITH MESH;  Surgeon: [name], MD;  Location: [facility]S;  Service: General;  Laterality: N/A;    MEDICATIONS: No current facility-administered medications for this encounter.   Marland Kitchen amLODipine (NORVASC) 5 MG tablet  . aspirin 325 MG tablet  . atorvastatin (LIPITOR) 80 MG tablet  . clopidogrel (PLAVIX) 75 MG tablet  . HYDROcodone-acetaminophen (NORCO) 10-325 MG tablet  . lisinopril (ZESTRIL) 20 MG tablet  . metoprolol tartrate (LOPRESSOR) 25 MG tablet  . nitroGLYCERIN (NITROSTAT) 0.4 MG SL tablet  . omeprazole (PRILOSEC) 20 MG capsule  Myra Gianotti, PA-C Surgical Short Stay/Anesthesiology [facility] Phone [phone] [facility] Phone [phone] 01/01/2019 11:47 AM

## 2019-01-01 NOTE — Anesthesia Preprocedure Evaluation (Addendum)
Anesthesia Evaluation  Patient identified by MRN, date of birth, ID band Patient awake    Reviewed: Allergy & Precautions, NPO status , Patient's Chart, lab work & pertinent test results  Airway Mallampati: I  TM Distance: >3 FB Neck ROM: Full    Dental no notable dental hx. (+) Poor Dentition,    Pulmonary former smoker,    Pulmonary exam normal breath sounds clear to auscultation       Cardiovascular hypertension, Pt. on medications + CAD and + Cardiac Stents  Normal cardiovascular exam Rhythm:Regular Rate:Normal     Neuro/Psych Anxiety L sided weakeness CVA    GI/Hepatic Neg liver ROS, GERD  Medicated,  Endo/Other  negative endocrine ROS  Renal/GU Cr 1.05     Musculoskeletal   Abdominal   Peds  Hematology negative hematology ROS (+)   Anesthesia Other Findings   Reproductive/Obstetrics                           Anesthesia Physical Anesthesia Plan  ASA: III  Anesthesia Plan: General   Post-op Pain Management:    Induction: Intravenous  PONV Risk Score and Plan: Treatment may vary due to age or medical condition and Ondansetron  Airway Management Planned: Oral ETT  Additional Equipment:   Intra-op Plan:   Post-operative Plan: Extubation in OR  Informed Consent: I have reviewed the patients History and Physical, chart, labs and discussed the procedure including the risks, benefits and alternatives for the proposed anesthesia with the patient or authorized representative who has indicated his/her understanding and acceptance.     Dental advisory given  Plan Discussed with: CRNA  Anesthesia Plan Comments: (PAT note written 01/01/2019 by Myra Gianotti, PA-C. SAME DAY WORK-UP   )       Anesthesia Quick Evaluation

## 2019-01-02 ENCOUNTER — Encounter (HOSPITAL_COMMUNITY)
Admission: RE | Disposition: A | Discharge status: Home / Self Care | Payer: Self-pay | Source: Home / Self Care | Attending: Otolaryngology

## 2019-01-02 ENCOUNTER — Other Ambulatory Visit: Payer: Self-pay

## 2019-01-02 ENCOUNTER — Encounter (HOSPITAL_COMMUNITY): Payer: Self-pay | Admitting: Otolaryngology

## 2019-01-02 ENCOUNTER — Ambulatory Visit (HOSPITAL_COMMUNITY): Payer: BC Managed Care – PPO | Admitting: Vascular Surgery

## 2019-01-02 ENCOUNTER — Ambulatory Visit: Payer: BC Managed Care – PPO | Admitting: Student

## 2019-01-02 ENCOUNTER — Ambulatory Visit (HOSPITAL_COMMUNITY)
Admission: RE | Admit: 2019-01-02 | Discharge: 2019-01-02 | Disposition: A | Discharge status: Home / Self Care | Payer: BC Managed Care – PPO | Attending: Otolaryngology | Admitting: Otolaryngology

## 2019-01-02 DIAGNOSIS — Z87891 Personal history of nicotine dependence: Secondary | ICD-10-CM | POA: Diagnosis not present

## 2019-01-02 DIAGNOSIS — Z7902 Long term (current) use of antithrombotics/antiplatelets: Secondary | ICD-10-CM | POA: Insufficient documentation

## 2019-01-02 DIAGNOSIS — I10 Essential (primary) hypertension: Secondary | ICD-10-CM | POA: Insufficient documentation

## 2019-01-02 DIAGNOSIS — Z955 Presence of coronary angioplasty implant and graft: Secondary | ICD-10-CM | POA: Diagnosis not present

## 2019-01-02 DIAGNOSIS — F419 Anxiety disorder, unspecified: Secondary | ICD-10-CM | POA: Insufficient documentation

## 2019-01-02 DIAGNOSIS — Z7982 Long term (current) use of aspirin: Secondary | ICD-10-CM | POA: Diagnosis not present

## 2019-01-02 DIAGNOSIS — R070 Pain in throat: Secondary | ICD-10-CM | POA: Diagnosis not present

## 2019-01-02 DIAGNOSIS — Z79899 Other long term (current) drug therapy: Secondary | ICD-10-CM | POA: Diagnosis not present

## 2019-01-02 DIAGNOSIS — J439 Emphysema, unspecified: Secondary | ICD-10-CM | POA: Diagnosis not present

## 2019-01-02 DIAGNOSIS — Z8673 Personal history of transient ischemic attack (TIA), and cerebral infarction without residual deficits: Secondary | ICD-10-CM | POA: Diagnosis not present

## 2019-01-02 DIAGNOSIS — K219 Gastro-esophageal reflux disease without esophagitis: Secondary | ICD-10-CM | POA: Insufficient documentation

## 2019-01-02 DIAGNOSIS — C329 Malignant neoplasm of larynx, unspecified: Secondary | ICD-10-CM | POA: Diagnosis not present

## 2019-01-02 DIAGNOSIS — E7800 Pure hypercholesterolemia, unspecified: Secondary | ICD-10-CM | POA: Insufficient documentation

## 2019-01-02 DIAGNOSIS — R1310 Dysphagia, unspecified: Secondary | ICD-10-CM | POA: Insufficient documentation

## 2019-01-02 DIAGNOSIS — I2510 Atherosclerotic heart disease of native coronary artery without angina pectoris: Secondary | ICD-10-CM | POA: Insufficient documentation

## 2019-01-02 DIAGNOSIS — I700 Atherosclerosis of aorta: Secondary | ICD-10-CM | POA: Diagnosis not present

## 2019-01-02 DIAGNOSIS — I6523 Occlusion and stenosis of bilateral carotid arteries: Secondary | ICD-10-CM | POA: Diagnosis not present

## 2019-01-02 DIAGNOSIS — R599 Enlarged lymph nodes, unspecified: Secondary | ICD-10-CM | POA: Diagnosis not present

## 2019-01-02 HISTORY — PX: DIRECT LARYNGOSCOPY: SHX5326

## 2019-01-02 HISTORY — DX: Occlusion and stenosis of unspecified carotid artery: I65.29

## 2019-01-02 LAB — CBC
HCT: 39.8 % (ref 39.0–52.0)
Hemoglobin: 14.5 g/dL (ref 13.0–17.0)
MCH: 33.8 pg (ref 26.0–34.0)
MCHC: 36.4 g/dL — ABNORMAL HIGH (ref 30.0–36.0)
MCV: 92.8 fL (ref 80.0–100.0)
Platelets: 299 10*3/uL (ref 150–400)
RBC: 4.29 MIL/uL (ref 4.22–5.81)
RDW: 12.2 % (ref 11.5–15.5)
WBC: 6.9 10*3/uL (ref 4.0–10.5)
nRBC: 0 % (ref 0.0–0.2)

## 2019-01-02 LAB — BASIC METABOLIC PANEL
Anion gap: 11 (ref 5–15)
BUN: 12 mg/dL (ref 6–20)
CO2: 22 mmol/L (ref 22–32)
Calcium: 9.6 mg/dL (ref 8.9–10.3)
Chloride: 96 mmol/L — ABNORMAL LOW (ref 98–111)
Creatinine, Ser: 0.99 mg/dL (ref 0.61–1.24)
GFR calc Af Amer: >60 mL/min (ref >60)
GFR calc non Af Amer: >60 mL/min (ref >60)
Glucose, Bld: 122 mg/dL — ABNORMAL HIGH (ref 70–99)
Potassium: 4.4 mmol/L (ref 3.5–5.1)
Sodium: 129 mmol/L — ABNORMAL LOW (ref 135–145)

## 2019-01-02 SURGERY — LARYNGOSCOPY, DIRECT
Anesthesia: General

## 2019-01-02 MED ORDER — ONDANSETRON HCL 4 MG/2ML IJ SOLN: 4.0000 mg | Freq: Once | INTRAMUSCULAR | Status: DC | PRN

## 2019-01-02 MED ORDER — LACTATED RINGERS IV SOLN: INTRAVENOUS | Status: DC

## 2019-01-02 MED ORDER — LIDOCAINE-EPINEPHRINE 1 %-1:100000 IJ SOLN
INTRAMUSCULAR | Status: AC
  Filled 2019-01-02: qty 1

## 2019-01-02 MED ORDER — 0.9 % SODIUM CHLORIDE (POUR BTL) OPTIME
TOPICAL | Status: DC | PRN
  Administered 2019-01-02: 1000 mL

## 2019-01-02 MED ORDER — PROPOFOL 10 MG/ML IV BOLUS
INTRAVENOUS | Status: AC
  Filled 2019-01-02: qty 20

## 2019-01-02 MED ORDER — LIDOCAINE 2% (20 MG/ML) 5 ML SYRINGE
INTRAMUSCULAR | Status: DC | PRN
  Administered 2019-01-02: 80 mg via INTRAVENOUS

## 2019-01-02 MED ORDER — ROCURONIUM BROMIDE 10 MG/ML (PF) SYRINGE
PREFILLED_SYRINGE | INTRAVENOUS | Status: DC | PRN
  Administered 2019-01-02: 40 mg via INTRAVENOUS

## 2019-01-02 MED ORDER — SUGAMMADEX SODIUM 200 MG/2ML IV SOLN
INTRAVENOUS | Status: DC | PRN
  Administered 2019-01-02: 200 mg via INTRAVENOUS

## 2019-01-02 MED ORDER — CEFAZOLIN SODIUM-DEXTROSE 2-3 GM-%(50ML) IV SOLR
INTRAVENOUS | Status: DC | PRN
  Administered 2019-01-02: 2 g via INTRAVENOUS

## 2019-01-02 MED ORDER — PROPOFOL 10 MG/ML IV BOLUS
INTRAVENOUS | Status: DC | PRN
  Administered 2019-01-02: 100 mg via INTRAVENOUS

## 2019-01-02 MED ORDER — MIDAZOLAM HCL 2 MG/2ML IJ SOLN
INTRAMUSCULAR | Status: AC
  Filled 2019-01-02: qty 2

## 2019-01-02 MED ORDER — MIDAZOLAM HCL 5 MG/5ML IJ SOLN
INTRAMUSCULAR | Status: DC | PRN
  Administered 2019-01-02: 1 mg via INTRAVENOUS

## 2019-01-02 MED ORDER — ACETAMINOPHEN 10 MG/ML IV SOLN: 1000.0000 mg | Freq: Once | INTRAVENOUS | Status: DC | PRN

## 2019-01-02 MED ORDER — FENTANYL CITRATE (PF) 100 MCG/2ML IJ SOLN: 25.0000 ug | INTRAMUSCULAR | Status: DC | PRN

## 2019-01-02 MED ORDER — EPINEPHRINE HCL (NASAL) 0.1 % NA SOLN
NASAL | Status: AC
  Filled 2019-01-02: qty 30

## 2019-01-02 MED ORDER — DEXAMETHASONE SODIUM PHOSPHATE 10 MG/ML IJ SOLN
INTRAMUSCULAR | Status: DC | PRN
  Administered 2019-01-02: 10 mg via INTRAVENOUS

## 2019-01-02 MED ORDER — ONDANSETRON HCL 4 MG/2ML IJ SOLN
INTRAMUSCULAR | Status: DC | PRN
  Administered 2019-01-02: 4 mg via INTRAVENOUS

## 2019-01-02 MED ORDER — EPINEPHRINE HCL (NASAL) 0.1 % NA SOLN
NASAL | Status: DC | PRN
  Administered 2019-01-02: 30 mL via TOPICAL

## 2019-01-02 MED ORDER — OXYMETAZOLINE HCL 0.05 % NA SOLN
NASAL | Status: AC
  Filled 2019-01-02: qty 30

## 2019-01-02 MED ORDER — FENTANYL CITRATE (PF) 100 MCG/2ML IJ SOLN
INTRAMUSCULAR | Status: DC | PRN
  Administered 2019-01-02 (×2): 50 ug via INTRAVENOUS

## 2019-01-02 MED ORDER — FENTANYL CITRATE (PF) 250 MCG/5ML IJ SOLN
INTRAMUSCULAR | Status: AC
  Filled 2019-01-02: qty 5

## 2019-01-02 SURGICAL SUPPLY — 37 items
BLADE SURG 15 STRL LF DISP TIS (BLADE) IMPLANT
BLADE SURG 15 STRL SS (BLADE)
CANISTER SUCT 3000ML PPV (MISCELLANEOUS) ×2 IMPLANT
CLEANER TIP ELECTROSURG 2X2 (MISCELLANEOUS) ×2 IMPLANT
COVER BACK TABLE 60X90IN (DRAPES) ×2 IMPLANT
COVER SURGICAL LIGHT HANDLE (MISCELLANEOUS) ×2 IMPLANT
COVER WAND RF STERILE (DRAPES) IMPLANT
DERMABOND ADVANCED (GAUZE/BANDAGES/DRESSINGS)
DERMABOND ADVANCED .7 DNX12 (GAUZE/BANDAGES/DRESSINGS) IMPLANT
DRAPE HALF SHEET 40X57 (DRAPES) ×2 IMPLANT
ELECT COATED BLADE 2.86 ST (ELECTRODE) IMPLANT
ELECT NEEDLE BLADE 2-5/6 (NEEDLE) IMPLANT
ELECT REM PT RETURN 9FT ADLT (ELECTROSURGICAL) ×2
ELECTRODE REM PT RTRN 9FT ADLT (ELECTROSURGICAL) ×1 IMPLANT
GAUZE SPONGE 4X4 12PLY STRL LF (GAUZE/BANDAGES/DRESSINGS) ×2 IMPLANT
GLOVE BIO SURGEON STRL SZ7.5 (GLOVE) ×2 IMPLANT
GLOVE ECLIPSE 7.5 STRL STRAW (GLOVE) ×2 IMPLANT
GOWN STRL REUS W/ TWL LRG LVL3 (GOWN DISPOSABLE) ×2 IMPLANT
GOWN STRL REUS W/TWL LRG LVL3 (GOWN DISPOSABLE) ×4
GUARD TEETH (MISCELLANEOUS) ×4 IMPLANT
KIT BASIN OR (CUSTOM PROCEDURE TRAY) ×2 IMPLANT
KIT TURNOVER KIT B (KITS) ×2 IMPLANT
NEEDLE HYPO 25GX1X1/2 BEV (NEEDLE) IMPLANT
NS IRRIG 1000ML POUR BTL (IV SOLUTION) ×2 IMPLANT
PACK UNIVERSAL I (CUSTOM PROCEDURE TRAY) ×2 IMPLANT
PAD ARMBOARD 7.5X6 YLW CONV (MISCELLANEOUS) ×4 IMPLANT
PATTIES SURGICAL .5 X1 (DISPOSABLE) IMPLANT
PENCIL BUTTON HOLSTER BLD 10FT (ELECTRODE) ×2 IMPLANT
SOL ANTI FOG 6CC (MISCELLANEOUS) IMPLANT
SOLUTION ANTI FOG 6CC (MISCELLANEOUS)
SPECIMEN JAR SMALL (MISCELLANEOUS) ×2 IMPLANT
SUT SILK 2 0 (SUTURE)
SUT SILK 2-0 18XBRD TIE 12 (SUTURE) IMPLANT
TOWEL GREEN STERILE FF (TOWEL DISPOSABLE) ×2 IMPLANT
TRAY ENT MC OR (CUSTOM PROCEDURE TRAY) IMPLANT
TUBE CONNECTING 12X1/4 (SUCTIONS) ×2 IMPLANT
WATER STERILE IRR 1000ML POUR (IV SOLUTION) ×2 IMPLANT

## 2019-01-02 NOTE — Anesthesia Postprocedure Evaluation (Signed)
Anesthesia Post Note  Patient: ALBIE ARIZPE  Procedure(s) Performed: MICRO DIRECT LARYNGOSCOPY BIOPSY OF LARYNGEAL MASS (N/A )     Patient location during evaluation: PACU Anesthesia Type: General Level of consciousness: awake and alert Pain management: pain level controlled Vital Signs Assessment: post-procedure vital signs reviewed and stable Respiratory status: spontaneous breathing, nonlabored ventilation, respiratory function stable and patient connected to nasal cannula oxygen Cardiovascular status: blood pressure returned to baseline and stable Postop Assessment: no apparent nausea or vomiting Anesthetic complications: no    Last Vitals:  Vitals:   01/02/19 1245 01/02/19 1300  BP: (!) 158/83 (!) 160/78  Pulse: 81 71  Resp: 10 (!) 9  Temp: 36.9 C 36.8 C  SpO2: 100% 98%    Last Pain:  Vitals:   01/02/19 1300  TempSrc:   PainSc: 0-No pain                 Barnet Glasgow

## 2019-01-02 NOTE — Anesthesia Procedure Notes (Signed)
Procedure Name: Intubation Date/Time: 01/02/2019 12:13 PM Performed by: Imagene Riches, CRNA Pre-anesthesia Checklist: Patient identified, Emergency Drugs available, Suction available and Patient being monitored Patient Re-evaluated:Patient Re-evaluated prior to induction Oxygen Delivery Method: Circle System Utilized Preoxygenation: Pre-oxygenation with 100% oxygen Induction Type: IV induction Ventilation: Mask ventilation without difficulty Laryngoscope Size: Miller and 2 Grade View: Grade I Tube type: Oral Tube size: 7.5 mm Number of attempts: 1 Airway Equipment and Method: Stylet and Oral airway Placement Confirmation: ETT inserted through vocal cords under direct vision,  positive ETCO2 and breath sounds checked- equal and bilateral Secured at: 23 cm Tube secured with: Tape Dental Injury: Teeth and Oropharynx as per pre-operative assessment

## 2019-01-02 NOTE — Op Note (Signed)
DATE OF PROCEDURE:  01/02/2019                              OPERATIVE REPORT  SURGEON:  [name], MD  PREOPERATIVE DIAGNOSES: 1. Laryngeal mass. 2. Throat pain. 3. Dysphagia.  POSTOPERATIVE DIAGNOSES: 1. Laryngeal mass. 2. Throat pain. 3. Dysphagia.  PROCEDURE PERFORMED: MicroDirect laryngoscopy with biopsy of laryngeal mass.  ANESTHESIA:  General endotracheal tube anesthesia.  COMPLICATIONS:  None.  ESTIMATED BLOOD LOSS:  Minimal.  INDICATION FOR PROCEDURE:  [name] is a 58 y.o. male with a history of chronic throat discomfort. He was previously noted to have severe posterior laryngeal edema and erythema.  The appearance was suggestive of a possible posterior laryngeal mass.  His food often gets stuck in his throat.  His CT showed a likely left laryngeal mass. Based on the above findings, the decision was made for the patient to undergo the above stated procedure. Likelihood of success in reducing symptoms was also discussed.  The risks, benefits, alternatives, and details of the procedure were discussed with the patient.  Questions were invited and answered.  Informed consent was obtained.  DESCRIPTION:  The patient was taken to the operating room and placed supine on the operating table.  General endotracheal tube anesthesia was administered by the anesthesiologist.  The patient was positioned and prepped and draped in a standard fashion for direct laryngoscopy.  A Dedo laryngoscope was used for examination.  The laryngoscope was inserted via the oral cavity into the pharynx.  Examination of the vallecula, piriform sinuses, and epiglottis were normal.  Significant posterior laryngeal edema was noted.  A soft tissue mass was noted to extend from the left aryepiglottic fold to the left arytenoid cartilage.  No mass was noted on the vocal cord.  The Dedo laryngoscope was suspended with a Lewy suspender.  An operating microscope was brought into the field.  Under the operating  microscope, 3 biopsy specimens were obtained from the laryngeal mass.  The specimens were sent to the pathology department for permanent histologic identification.  Hemostasis was achieved with pledgets soaked with epinephrine.  The pledgets were subsequently removed.  The Dedo laryngoscope was withdrawn.  The care of the patient was turned over to the anesthesiologist.  The patient was awakened from anesthesia without difficulty.  The patient was extubated and transferred to the recovery room in good condition.  OPERATIVE FINDINGS: A soft tissue mass was noted over the left posterior larynx, overlying the left arytenoid cartilage.  SPECIMEN: Laryngeal mass biopsy specimens.  FOLLOWUP CARE:  The patient will be discharged home once awake and alert. The patient will follow up in my office in approximately 1 Week.  [name] 01/02/2019 12:33 PM

## 2019-01-02 NOTE — Transfer of Care (Signed)
Immediate Anesthesia Transfer of Care Note  Patient: [name]  Procedure(s) Performed: MICRO DIRECT LARYNGOSCOPY BIOPSY OF LARYNGEAL MASS (N/A )  Patient Location: PACU  Anesthesia Type:General  Level of Consciousness: awake, alert  and oriented  Airway & Oxygen Therapy: Patient Spontanous Breathing and Patient connected to face mask oxygen  Post-op Assessment: Report given to RN and Post -op Vital signs reviewed and stable  Post vital signs: Reviewed and stable  Last Vitals:  Vitals Value Taken Time  BP 158/83 01/02/19 1245  Temp 36.9 C 01/02/19 1245  Pulse 78 01/02/19 1247  Resp 15 01/02/19 1247  SpO2 100 % 01/02/19 1247  Vitals shown include unvalidated device data.  Last Pain:  Vitals:   01/02/19 1245  TempSrc:   PainSc: 0-No pain         Complications: No apparent anesthesia complications

## 2019-01-02 NOTE — H&P (Signed)
Cc: Hoarseness, laryngeal mass  HPI: The patient is a 58 year old male who returns today for his follow-up evaluation. The patient was previously seen in 08/2018.  At that time, he was complaining of chronic throat discomfort.  He was noted to have severe posterior laryngeal edema and erythema.  The appearance was also suggestive of possible laryngeal mass.  He was scheduled to undergo microdirect laryngoscopy and biopsy of his larynx.  However, the surgery was canceled due to his recent stroke.  The patient is currently on a blood thinner.  He returns today complaining of persistent throat discomfort and occasional dysphagia.  His food often gets stuck in his throat.  He denies any odynophagia or breathing difficulty.  His CT shows a likely laryngeal mass.  Exam: The nasal cavities were decongested and anesthetised with a combination of oxymetazoline and 4% lidocaine solution.  The flexible scope was inserted into the right nasal cavity and advanced towards the nasopharynx.  Visualized mucosa over the turbinates and septum were normal.  The nasopharynx was clear.  Oropharyngeal walls were symmetric and mobile without lesion, mass, or edema.  Hypopharynx was also without  lesion or edema.  Larynx was mobile without lesions.  No lesions or asymmetry in the supraglottic larynx.  Arytenoid mucosa was severe edematous with erythema.  True vocal folds were pale yellow and edematous but without mass or lesion.  Base of tongue was within normal limits. The patient tolerated the procedure well.    Assessment: 1.  The patient is noted to have severe posterior laryngeal edema and erythema. The appearance is also suggestive of a soft tissue mass.  2.  The patient's throat discomfort is likely secondary to the posterior laryngeal edema and laryngeal mass.    Plan: 1.  The laryngoscopy findings are reviewed with the patient.  2.  Based on the above findings, we will proceed with microdirect laryngoscopy and biopsy.

## 2019-01-02 NOTE — Discharge Instructions (Addendum)
The patient may resume all his previous activities and diet.  No postop restrictions.  The patient will follow up in my office in 1 week.

## 2019-01-02 NOTE — Progress Notes (Signed)
Dr. Valma Cava aware of patient's elevated BP.  No orders given.  Will continue to monitor.

## 2019-01-06 LAB — SURGICAL PATHOLOGY

## 2019-01-13 ENCOUNTER — Other Ambulatory Visit (HOSPITAL_COMMUNITY): Payer: Self-pay | Admitting: Otolaryngology

## 2019-01-13 DIAGNOSIS — C329 Malignant neoplasm of larynx, unspecified: Secondary | ICD-10-CM

## 2019-01-15 ENCOUNTER — Encounter (HOSPITAL_COMMUNITY): Payer: Self-pay | Admitting: *Deleted

## 2019-01-15 NOTE — Progress Notes (Signed)
I attempted to call patient to introduce myself.  I left him a brief message explaining my role in his care. I left my contact information and advised for him to call should he have any questions or concerns before his appointment next week.

## 2019-01-20 ENCOUNTER — Encounter (HOSPITAL_COMMUNITY): Payer: Self-pay | Admitting: *Deleted

## 2019-01-20 NOTE — Progress Notes (Signed)
Patient returned my call.  I introduced myself and provide information in how I will be involved with his care.  I provided information on his first visit and what to expect.  I made sure patient was aware of appointment time and directions to the cancer center.  My phone number was given so that he can call me with any questions or concerns.  He voices appreciation and understanding.

## 2019-01-21 ENCOUNTER — Ambulatory Visit (HOSPITAL_COMMUNITY): Payer: 59

## 2019-01-21 ENCOUNTER — Other Ambulatory Visit: Payer: Self-pay

## 2019-01-21 ENCOUNTER — Encounter (HOSPITAL_COMMUNITY): Payer: Self-pay

## 2019-01-22 ENCOUNTER — Encounter (HOSPITAL_COMMUNITY): Payer: Self-pay | Admitting: Hematology

## 2019-01-22 ENCOUNTER — Inpatient Hospital Stay (HOSPITAL_COMMUNITY): Payer: 59 | Attending: Hematology | Admitting: Hematology

## 2019-01-22 VITALS — BP 99/83 | HR 67 | Temp 97.6°F | Resp 18 | Ht 70.5 in | Wt 175.3 lb

## 2019-01-22 DIAGNOSIS — C321 Malignant neoplasm of supraglottis: Secondary | ICD-10-CM | POA: Diagnosis present

## 2019-01-22 DIAGNOSIS — Z87891 Personal history of nicotine dependence: Secondary | ICD-10-CM | POA: Diagnosis not present

## 2019-01-22 DIAGNOSIS — Z800 Family history of malignant neoplasm of digestive organs: Secondary | ICD-10-CM | POA: Insufficient documentation

## 2019-01-22 DIAGNOSIS — Z7982 Long term (current) use of aspirin: Secondary | ICD-10-CM | POA: Diagnosis not present

## 2019-01-22 DIAGNOSIS — C329 Malignant neoplasm of larynx, unspecified: Secondary | ICD-10-CM

## 2019-01-22 DIAGNOSIS — Z808 Family history of malignant neoplasm of other organs or systems: Secondary | ICD-10-CM | POA: Insufficient documentation

## 2019-01-22 DIAGNOSIS — Z7289 Other problems related to lifestyle: Secondary | ICD-10-CM | POA: Diagnosis not present

## 2019-01-22 DIAGNOSIS — Z8673 Personal history of transient ischemic attack (TIA), and cerebral infarction without residual deficits: Secondary | ICD-10-CM | POA: Diagnosis not present

## 2019-01-22 DIAGNOSIS — Z7901 Long term (current) use of anticoagulants: Secondary | ICD-10-CM | POA: Diagnosis not present

## 2019-01-22 NOTE — Patient Instructions (Signed)
[place] at [facility] Discharge Instructions  You were seen today by Dr. Delton Coombes. He went over your history, family history and how you've been feeling lately. He will refer you to a dentist for consultation. He will also refer you for a speech consultation. He will also send you for a hearing test. He will see you back after your scan for follow up.   Thank you for choosing [place] at [facility] to provide your oncology and hematology care.  To afford each patient quality time with our provider, please arrive at least 15 minutes before your scheduled appointment time.   If you have a lab appointment with the [facility] please come in thru the  Main Entrance and check in at the main information desk  You need to re-schedule your appointment should you arrive 10 or more minutes late.  We strive to give you quality time with our providers, and arriving late affects you and other patients whose appointments are after yours.  Also, if you no show three or more times for appointments you may be dismissed from the clinic at the providers discretion.     Again, thank you for choosing [facility].  Our hope is that these requests will decrease the amount of time that you wait before being seen by our physicians.       _____________________________________________________________  Should you have questions after your visit to [facility], please contact our office at [phone] between the hours of 8:00 a.m. and 4:30 p.m.  Voicemails left after 4:00 p.m. will not be returned until the following business day.  For prescription refill requests, have your pharmacy contact our office and allow 72 hours.    Cancer Center Support Programs:   > Cancer Support Group  2nd Tuesday of the month 1pm-2pm, Journey Room

## 2019-01-22 NOTE — Progress Notes (Signed)
[facility] NOTE  Patient Care Team: [name], MD as PCP - General (Internal Medicine) Donetta Potts, RN as Oncology Nurse Navigator (Oncology) [name], MD as Medical Oncologist (Oncology)  CHIEF COMPLAINTS/PURPOSE OF CONSULTATION:  Supraglottic squamous cell carcinoma.  HISTORY OF PRESENTING ILLNESS:  [name] 59 y.o. male is seen at the request of Dr. Benjamine Mola for further management of newly diagnosed supraglottic squamous cell carcinoma.  This patient has been followed in the ENT clinic for sore throat for almost a year.  Flexible fiberoptic laryngoscopy on 12/05/2018 showed arytenoid mucosa was severely edematous with erythema.  True vocal cords were normal without any lesion.  Base of the tongue was normal.  He underwent MicroDirect laryngoscopy and was noted to have a soft tissue mass over the left posterior larynx, overlying the left arytenoid cartilage, biopsy of the mass on 01/02/2019 consistent with invasive squamous cell carcinoma.  He also underwent CT scan of the soft tissue of the neck on 12/26/2018 which showed soft tissue mass in the left aryepiglottic fold extending into the supraglottic larynx.  Heterogeneous and somewhat rounded left level 2 lymph node measuring 10 x 18 x 15 mm.  2 small left level 2 lymph nodes and 2 more posterior level 2 lymph nodes concerning for malignancy.  He also has high-grade stenosis of the proximal left common carotid artery and proximal right internal carotid artery.  He also had a stroke in July of this year and is currently on Plavix and aspirin.  He reports mild dysphagia particularly to lettuce and green beans.  He has no regurgitation.  He has mild pain on swallowing.  Denies any weight loss in the last 6 months to 1 year.  Denies any hearing loss.  No history of neuropathy.  He worked in [employer] and thinks he has exposure to asbestos.  He quit smoking about 10 years ago.  He smoked 2 to 3  packs/day for 40 years.  He drinks 1 sixpack of beer every day all his life.  Maternal uncle had pancreatic cancer.  Paternal grandfather had throat cancer.  MEDICAL HISTORY:  Past Medical History:  Diagnosis Date  . Anxiety   . Arthritis    back  . Bradycardia    a. During 11/2012 adm: lopressor decreased.  . Carotid artery stenosis    65% RICA, mild-moderate L caroitd bulb 08/02/18 angiography, six month f/u rec  . 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 Coronary artery disease    a. Diag cath 10/2012 for CP/abnormal nuc -> planned PCI s/p LAD atherectomy/DES placement 11/05/12.  Marland Kitchen GERD (gastroesophageal reflux disease)   . Hypercholesteremia   . Hypertension   . Stroke ([place]) 07/31/2018   bilateral vertebrobasilar occlusion; "left side weaker thanit was before."    SURGICAL HISTORY: Past Surgical History:  Procedure Laterality Date  . CARDIAC CATHETERIZATION  10/29/2012  . CORONARY ANGIOPLASTY WITH STENT PLACEMENT  11/05/2012  . DIRECT LARYNGOSCOPY N/A 01/02/2019   Procedure: MICRO DIRECT LARYNGOSCOPY BIOPSY OF LARYNGEAL MASS;  Surgeon: [name], MD;  Location: [place] OR;  Service: ENT;  Laterality: N/A;  . HEMORRHOID SURGERY  ~ 2010  . INSERTION OF MESH N/A 06/02/2015   Procedure: INSERTION OF MESH;  Surgeon: [name], MD;  Location: [facility];  Service: General;  Laterality: N/A;  . IR ANGIO INTRA EXTRACRAN SEL COM CAROTID INNOMINATE BILAT  MOD SED  08/02/2018  . IR ANGIO VERTEBRAL SEL VERTEBRAL BILAT MOD SED  08/02/2018  . LEFT HEART CATHETERIZATION WITH CORONARY ANGIOGRAM N/A 10/30/2012   Procedure: LEFT HEART CATHETERIZATION WITH CORONARY ANGIOGRAM;  Surgeon: [name], MD;  Location: [place] [facility] CATH LAB;  Service: Cardiovascular;  Laterality: N/A;  . PERCUTANEOUS CORONARY ROTOBLATOR INTERVENTION (PCI-R) N/A 11/05/2012   Procedure: PERCUTANEOUS CORONARY ROTOBLATOR INTERVENTION (PCI-R);  Surgeon: [name], MD;  Location[facility] C[facility] LAB;  Service: Cardiovascular;  Laterality: N/A;  . UMBILICAL HERNIA REPAIR N/A 06/02/2015   Procedure: UMBILICAL HERNIORRHAPHY WITH MESH;  Surgeon: [name], MD;  Location: [facility];  Service: General;  Laterality: N/A;    SOCIAL HISTORY: Social History   Socioeconomic History  . Marital status: Married    Spouse name: Not on file  . Number of children: 0  . Years of education: Not on file  . Highest education level: Not on file  Occupational History  . Occupation: Disabled  Tobacco Use  . Smoking status: Former Smoker    Packs/day: 3.00    Years: 32.00    Pack years: 96.00    Types: Cigarettes    Start date: 08/29/1977    Quit date: 11/29/2006    Years since quitting: 12.1  . Smokeless tobacco: Former Systems developer    Types: Chew  . Tobacco comment: 11/05/2012 "chewed tobacco when I play ball; aien't chewed since age 50"  Substance and Sexual Activity  . Alcohol use: Yes    Alcohol/week: 18.0 standard drinks    Types: 18 Cans of beer per week  . Drug use: No  . Sexual activity: Not Currently  Other Topics Concern  . Not on file  Social History Narrative  . Not on file   Social Determinants of Health   Financial Resource Strain: Low Risk   . Difficulty of Paying Living Expenses: Not hard at all  Food Insecurity: No Food Insecurity  . Worried About Charity fundraiser in the Last Year: Never true  . Ran Out of Food in the Last Year: Never true  Transportation Needs: No Transportation Needs  . Lack of Transportation (Medical): No  . Lack of Transportation (Non-Medical): No  Physical Activity: Insufficiently Active  . Days of Exercise per Week: 2 days  . Minutes of Exercise per Session: 30 min  Stress: Stress Concern Present  . Feeling of Stress : To some extent  Social Connections: Moderately Isolated  . Frequency of Communication with Friends and Family: Once a week  . Frequency of Social Gatherings with Friends and Family: Once a week  .  Attends Religious Services: Never  . Active Member of Clubs or Organizations: No  . Attends Archivist Meetings: Never  . Marital Status: Married  Human resources officer Violence: Not At Risk  . Fear of Current or Ex-Partner: No  . Emotionally Abused: No  . Physically Abused: No  . Sexually Abused: No    FAMILY HISTORY: Family History  Problem Relation Age of Onset  . Hypertension Mother   . Heart disease Father   . Hypertension Father   . Heart disease Sister   . Hypertension Maternal Grandmother   . Hypertension Maternal Grandfather   . Hypertension Paternal Grandmother   . Hypertension Paternal Grandfather     ALLERGIES:  is allergic to prednisone.  MEDICATIONS:  Current Outpatient Medications  Medication Sig Dispense Refill  . amLODipine (NORVASC) 5 MG tablet Take 1 tablet (5 mg total) by mouth daily. [place]  tablet 0  . aspirin EC 81 MG tablet Take 81 mg by mouth daily.    Marland Kitchen atorvastatin (LIPITOR) 80 MG tablet Take 1 tablet (80 mg total) by mouth every evening. 90 tablet 3  . clopidogrel (PLAVIX) 75 MG tablet Take 1 tablet (75 mg total) by mouth daily. 90 tablet 3  . famotidine (PEPCID) 20 MG tablet Take 20 mg by mouth at bedtime.    Marland Kitchen HYDROcodone-acetaminophen (NORCO) 10-325 MG tablet Take 1 tablet by mouth every 4 (four) hours as needed. (Patient not taking: Reported on 01/21/2019) 50 tablet 0  . lisinopril (ZESTRIL) 20 MG tablet Take 1 tablet (20 mg total) by mouth daily. (Patient taking differently: Take 20 mg by mouth 2 (two) times daily. ) 30 tablet 0  . metoprolol tartrate (LOPRESSOR) 25 MG tablet TAKE 1/2 TABLET BY MOUTH TWICE DAILY (Patient taking differently: Take 12.5 mg by mouth 2 (two) times daily. ) 30 tablet 3  . nitroGLYCERIN (NITROSTAT) 0.4 MG SL tablet Place 1 tablet (0.4 mg total) under the tongue every 5 (five) minutes as needed for chest pain (CP or SOB). (Patient not taking: Reported on 01/21/2019) 25 tablet 3  . omeprazole (PRILOSEC) 20 MG capsule Take  20 mg by mouth daily.      No current facility-administered medications for this visit.    REVIEW OF SYSTEMS:   Constitutional: Denies fevers, chills or abnormal night sweats Eyes: Denies blurriness of vision, double vision or watery eyes Ears, nose, mouth, throat, and face: Denies mucositis or sore throat Respiratory: Denies cough, dyspnea or wheezes Cardiovascular: Denies palpitation, chest discomfort or lower extremity swelling Gastrointestinal:  Denies nausea, heartburn or change in bowel habits Skin: Denies abnormal skin rashes Lymphatics: Denies new lymphadenopathy or easy bruising Neurological:Denies numbness, tingling or new weaknesses Behavioral/Psych: Mood is stable, no new changes  All other systems were reviewed with the patient and are negative.  PHYSICAL EXAMINATION: ECOG PERFORMANCE STATUS: 1 - Symptomatic but completely ambulatory  Vitals:   01/22/19 1340  BP: 99/83  Pulse: 67  Resp: 18  Temp: 97.6 F (36.4 C)  SpO2: 98%   Filed Weights   01/22/19 1340  Weight: 175 lb 4.8 oz (79.5 kg)    GENERAL:alert, no distress and comfortable SKIN: skin color, texture, turgor are normal, no rashes or significant lesions EYES: normal, conjunctiva are pink and non-injected, sclera clear OROPHARYNX:no exudate, no erythema and lips, buccal mucosa, and tongue normal.  Poor dentition. NECK: supple, thyroid normal size, non-tender, without nodularity LYMPH:  no palpable lymphadenopathy in the cervical, axillary or inguinal LUNGS: clear to auscultation and percussion with normal breathing effort HEART: regular rate & rhythm and no murmurs and no lower extremity edema ABDOMEN:abdomen soft, non-tender and normal bowel sounds Musculoskeletal:no cyanosis of digits and no clubbing  PSYCH: alert & oriented x 3 with fluent speech NEURO: no focal motor/sensory deficits  LABORATORY DATA:  I have reviewed the data as listed Lab Results  Component Value Date   WBC 6.9 01/02/2019    HGB 14.5 01/02/2019   HCT 39.8 01/02/2019   MCV 92.8 01/02/2019   PLT 299 01/02/2019     Chemistry      Component Value Date/Time   NA 129 (L) 01/02/2019 1106   K 4.4 01/02/2019 1106   CL 96 (L) 01/02/2019 1106   CO2 22 01/02/2019 1106   BUN 12 01/02/2019 1106   BUN 13 12/24/2018 0918   CREATININE 0.99 01/02/2019 1106   CREATININE 1.23 10/28/2012 1558  Component Value Date/Time   CALCIUM 9.6 01/02/2019 1106   ALKPHOS 44 07/31/2018 1104   AST 19 07/31/2018 1104   ALT 12 07/31/2018 1104   BILITOT 1.0 07/31/2018 1104       RADIOGRAPHIC STUDIES: I have personally reviewed the radiological images as listed and agreed with the findings in the report. CT SOFT TISSUE NECK W CONTRAST  Result Date: 12/26/2018 CLINICAL DATA:  Difficulty swallowing, eating and talking for 1 year. Globus sensation. Laryngeal mass. EXAM: CT NECK WITH CONTRAST TECHNIQUE: Multidetector CT imaging of the neck was performed using the standard protocol following the bolus administration of intravenous contrast. CONTRAST:  40mL OMNIPAQUE IOHEXOL 300 MG/ML  SOLN COMPARISON:  CTA of the neck 08/01/2018 FINDINGS: Pharynx and larynx: The nasopharynx is clear. Soft palate is within normal limits. Tongue base is unremarkable. Palatine tonsils are within normal limits bilaterally. Epiglottis and vallecula are within normal limits. Asymmetric soft tissue mass is present within the left aryepiglottic fold extending to the supraglottic larynx. The area is somewhat ill-defined, measures at least 1.7 x 1.2 x 0.7 cm. Vocal cords are within normal limits. Trachea is normal. Salivary glands: The submandibular and parotid glands and ducts are within normal limits. Thyroid: Normal Lymph nodes: A heterogeneous and somewhat rounded left level 2 lymph node measures 10 x 18 x 15 mm. Two smaller left level 2 lymph nodes measure 5-6 mm, the more superior is hyperdense and concerning for malignancy as well. At least 2 more posterior left  level 2 lymph nodes are also hyperdense, concerning for malignancy. Low density level 4 lymph nodes are present bilaterally. An irregular hyperdense right level 4 lymph node measures 13 x 12 x 17 mm. A left level 4 node measures 17 x 12 x 7 mm. Vascular: Atherosclerotic calcifications are present at the aortic arch, origins the great vessels, bilateral carotid bifurcations. There is a high-grade stenosis involving the proximal left common carotid artery. Additional mural calcifications are present the common carotid arteries bilaterally. There dense calcifications at the carotid bifurcations bilaterally. High-grade stenosis is present in the proximal right ICA. Limited intracranial: The visualized intracranial contents are within normal limits. Mastoids and visualized paranasal sinuses: The paranasal sinuses and mastoid air cells are clear. Skeleton: Endplate degenerative changes are most evident at C5-6 and C6-7. There is some straightening of the normal cervical lordosis. No focal lytic or blastic lesions are present. Degenerative changes are present at C1-2. Upper chest: The lung apices are clear. Centrilobular emphysematous changes are present. No focal nodule, mass, or airspace disease is present. There is no significant pleural effusion or pneumothorax. IMPRESSION: 1. Asymmetric soft tissue mass lesion within the left aryepiglottic fold extending to the supraglottic larynx measures at least 1.7 x 1.2 x 0.7 cm. 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 2. Enlarged hyperdense left level 2 lymph nodes are concerning for malignancy. 3. Bilateral hypodense enlarged level 4 lymph nodes are less likely to be related to the squamous cell carcinoma. PET scan would be useful for further evaluation of these nodes and the presumed primary lesion. These nodes raise concern for a second malignancy. 4. High-grade stenosis of the proximal left common carotid artery. 5. High-grade stenosis of the proximal right  internal carotid artery. 6. Aortic Atherosclerosis (ICD10-I70.0) and Emphysema (ICD10-J43.9). Electronically Signed   By: San Morelle M.D.   On: 12/26/2018 19:35    ASSESSMENT & PLAN:  Malignant neoplasm of supraglottis ([place]) 1.  Supraglottic squamous cell carcinoma: -He reports sore throat for  a year and has been following up with Dr. Benjamine Mola for several months. -Flexible fiberoptic laryngoscopy on 12/05/2018 showed arytenoid mucosa was severely edematous with erythema.  True vocal cords were clearly yellow and edematous but without a mass or lesion.  Base of the tongue was within normal limits.  Larynx was mobile without lesions. -He underwent MicroDirect laryngoscopy noted a soft tissue mass over the left posterior larynx, overlying the left arytenoid cartilage.  Biopsy of the mass on 01/02/2019 consistent with invasive squamous cell carcinoma. -CT soft tissue neck on 12/26/2018 shows asymmetric soft tissue mass present within the left aryepiglottic fold extending into the supraglottic larynx, somewhat ill-defined measuring 1.7 x 1.2 x 0.7 cm.  Heterogeneous and somewhat rounded left level 2 lymph node measuring 10 x 18 x 15 mm.  2 smaller left level 2 lymph nodes measures 5-6 mm.  At least 2 more posterior level 2 lymph nodes are also hypodense concerning for malignancy.  Irregular hypodense right level 4 lymph node measures 13 x 12 x 17 mm. -He also had high-grade stenosis of the proximal left common carotid artery and proximal right internal carotid artery. -Patient quit smoking 10 years ago.  He smoked 2 to 3 packs/day for 40 years.  He drinks 6 pack beer every day all his life.  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 -He has mild dysphagia particularly to lettuce, green beans.  No regurgitation.  Mild pain on swallowing.  Denies any weight loss. -No hearing loss.  No neuropathy. -I have recommended a PET CT scan for staging.  We will also make a referral for  dental evaluation as he had poor dentition.  We will also make referral for swallow and speech evaluation.  We will obtain audiogram as baseline. -I will see him back after the PET CT scan to discuss results and further plan of action.  Most likely will be a candidate for concurrent chemoradiation therapy.  2.  CVA: -He had pontine stroke and large vessel occlusion. -He is currently on Plavix and aspirin 81 mg daily.  Orders Placed This Encounter  Procedures  . Ambulatory referral to Speech Therapy    Referral Priority:   Routine    Referral Type:   Speech Therapy    Referral Reason:   Specialty Services Required    Requested Specialty:   Speech Pathology    Number of Visits Requested:   1    All questions were answered. The patient knows to call the clinic with any problems, questions or concerns.      [name], MD 01/22/2019 6:05 PM

## 2019-01-22 NOTE — Assessment & Plan Note (Addendum)
1.  Supraglottic squamous cell carcinoma: -He reports sore throat for a year and has been following up with Dr. Benjamine Mola for several months. -Flexible fiberoptic laryngoscopy on 12/05/2018 showed arytenoid mucosa was severely edematous with erythema.  True vocal cords were clearly yellow and edematous but without a mass or lesion.  Base of the tongue was within normal limits.  Larynx was mobile without lesions. -He underwent MicroDirect laryngoscopy noted a soft tissue mass over the left posterior larynx, overlying the left arytenoid cartilage.  Biopsy of the mass on 01/02/2019 consistent with invasive squamous cell carcinoma. -CT soft tissue neck on 12/26/2018 shows asymmetric soft tissue mass present within the left aryepiglottic fold extending into the supraglottic larynx, somewhat ill-defined measuring 1.7 x 1.2 x 0.7 cm.  Heterogeneous and somewhat rounded left level 2 lymph node measuring 10 x 18 x 15 mm.  2 smaller left level 2 lymph nodes measures 5-6 mm.  At least 2 more posterior level 2 lymph nodes are also hypodense concerning for malignancy.  Irregular hypodense right level 4 lymph node measures 13 x 12 x 17 mm. -He also had high-grade stenosis of the proximal left common carotid artery and proximal right internal carotid artery. -Patient quit smoking 10 years ago.  He smoked 2 to 3 packs/day for 40 years.  He drinks 6 pack beer every day all his life.  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 -He has mild dysphagia particularly to lettuce, green beans.  No regurgitation.  Mild pain on swallowing.  Denies any weight loss. -No hearing loss.  No neuropathy. -I have recommended a PET CT scan for staging.  We will also make a referral for dental evaluation as he had poor dentition.  We will also make referral for swallow and speech evaluation.  We will obtain audiogram as baseline. -I will see him back after the PET CT scan to discuss results and further plan of action.  Most  likely will be a candidate for concurrent chemoradiation therapy.  2.  CVA: -He had pontine stroke and large vessel occlusion. -He is currently on Plavix and aspirin 81 mg daily.

## 2019-01-27 ENCOUNTER — Ambulatory Visit (HOSPITAL_COMMUNITY)
Admission: RE | Admit: 2019-01-27 | Discharge: 2019-01-27 | Disposition: A | Discharge status: Home / Self Care | Payer: 59 | Source: Ambulatory Visit | Attending: Otolaryngology | Admitting: Otolaryngology

## 2019-01-27 ENCOUNTER — Other Ambulatory Visit: Payer: Self-pay

## 2019-01-27 DIAGNOSIS — C329 Malignant neoplasm of larynx, unspecified: Secondary | ICD-10-CM | POA: Diagnosis present

## 2019-01-27 LAB — GLUCOSE, CAPILLARY: Glucose-Capillary: 111 mg/dL — ABNORMAL HIGH (ref 70–99)

## 2019-01-27 MED ORDER — FLUDEOXYGLUCOSE F - 18 (FDG) INJECTION
8.9100 | Freq: Once | INTRAVENOUS | Status: AC | PRN
  Administered 2019-01-27: 8.91 via INTRAVENOUS

## 2019-01-30 ENCOUNTER — Encounter (HOSPITAL_COMMUNITY): Payer: Self-pay | Admitting: Hematology

## 2019-01-30 ENCOUNTER — Inpatient Hospital Stay (HOSPITAL_BASED_OUTPATIENT_CLINIC_OR_DEPARTMENT_OTHER): Payer: 59 | Admitting: Hematology

## 2019-01-30 DIAGNOSIS — C321 Malignant neoplasm of supraglottis: Secondary | ICD-10-CM

## 2019-01-30 NOTE — Progress Notes (Signed)
Virtual Visit via Telephone Note  I connected with [name] on 01/30/19 at  9:30 AM EST by telephone and verified that I am speaking with the correct person using two identifiers.   I discussed the limitations, risks, security and privacy concerns of performing an evaluation and management service by telephone and the availability of in person appointments. I also discussed with the patient that there may be a patient responsible charge related to this service. The patient expressed understanding and agreed to proceed.   History of Present Illness: He was evaluated in our clinic for supraglottic squamous cell carcinoma on 01/22/2019 after referral from Dr. Benjamine [name].  He quit smoking 10 years ago and smoked 2 to 3 packs/day for 40 years.  He drinks 6 pack of beer every day all his life.  He had biopsy on 01/02/2019 which was consistent with invasive squamous cell carcinoma.   Observations/Objective: He reports he has been eating well and has not lost any weight.  He has mild dysphagia to lettuce and green beans.  No regurgitation.  Mild pain on swallowing.  No hearing loss or neuropathy.  Assessment and Plan:  1.  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  Hypermetabolic metastatic lymphadenopathy is noted in the neck bilaterally at level 2 and level 3 positions. -I have recommended concurrent chemoradiation therapy. -I will make a referral to Dr. Francesca [name] in [place]. -We have already made a referral to oral surgeon. -We will also make a referral to speech and swallow therapy.  We will also obtain nutrition consult. -We will make referral to Dr. Renold [name] for port placement. -I will see him back in 2 weeks to discuss further if he needs upfront PEG tube placement.  We will discuss this with Dr.Yanagihara and make the  decision.   Follow Up Instructions: RTC 2 weeks.   I discussed the assessment and treatment plan with the patient. The patient was provided an opportunity to ask questions and all were answered. The patient agreed with the plan and demonstrated an understanding of the instructions.   The patient was advised to call back or seek an in-person evaluation if the symptoms worsen or if the condition fails to improve as anticipated.  I provided 13 minutes of non-face-to-face time during this encounter.   [name], MD

## 2019-02-03 ENCOUNTER — Ambulatory Visit (HOSPITAL_COMMUNITY): Payer: 59 | Admitting: Speech Pathology

## 2019-02-03 ENCOUNTER — Encounter (HOSPITAL_COMMUNITY): Payer: Self-pay | Admitting: Lab

## 2019-02-03 ENCOUNTER — Other Ambulatory Visit (HOSPITAL_COMMUNITY): Payer: Self-pay | Admitting: Interventional Radiology

## 2019-02-03 DIAGNOSIS — I771 Stricture of artery: Secondary | ICD-10-CM

## 2019-02-03 NOTE — Progress Notes (Unsigned)
Referral sent to [facility].  Records faxed on 2/1

## 2019-02-04 ENCOUNTER — Other Ambulatory Visit: Payer: Self-pay | Admitting: Internal Medicine

## 2019-02-04 ENCOUNTER — Telehealth: Payer: Self-pay | Admitting: *Deleted

## 2019-02-04 ENCOUNTER — Other Ambulatory Visit (HOSPITAL_COMMUNITY): Payer: Self-pay | Admitting: Internal Medicine

## 2019-02-04 DIAGNOSIS — M549 Dorsalgia, unspecified: Secondary | ICD-10-CM

## 2019-02-04 NOTE — Telephone Encounter (Signed)
LVM on both home and cell phone for a callback to schedule an appointment with Dr. Isidore Moos - referral request from Dr. Delton Coombes.

## 2019-02-05 ENCOUNTER — Encounter (HOSPITAL_COMMUNITY): Payer: Self-pay | Admitting: Speech Pathology

## 2019-02-05 ENCOUNTER — Other Ambulatory Visit: Payer: Self-pay

## 2019-02-05 ENCOUNTER — Ambulatory Visit (HOSPITAL_COMMUNITY): Payer: 59 | Attending: Hematology | Admitting: Speech Pathology

## 2019-02-05 DIAGNOSIS — R1312 Dysphagia, oropharyngeal phase: Secondary | ICD-10-CM | POA: Diagnosis not present

## 2019-02-05 NOTE — Therapy (Signed)
[place], [place], [address] Phone: [phone]   Fax:  [phone]  Speech Language Pathology Evaluation  Patient Details  Name: [name] MRN: [id number] Date of Birth: [birth year] No data recorded  Encounter Date: 02/05/2019  End of Session - 02/05/19 1225    Visit Number  1    Number of Visits  6    Date for SLP Re-Evaluation  05/05/19    Authorization Type  [employer]   no deductible, OOP $900, copay $10, 30 visit limit, auth required   SLP Start Time  1002    SLP Stop Time   1053    SLP Time Calculation (min)  51 min    Activity Tolerance  Patient tolerated treatment well       Past Medical History:  Diagnosis Date  . Anxiety   . Arthritis    back  . Bradycardia    a. During 11/2012 adm: lopressor decreased.  . Carotid artery stenosis    65% RICA, mild-moderate L caroitd bulb 08/02/18 angiography, six month f/u rec  . 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 Coronary artery disease    a. Diag cath 10/2012 for CP/abnormal nuc -> planned PCI s/p LAD atherectomy/DES placement 11/05/12.  Marland Kitchen GERD (gastroesophageal reflux disease)   . Hypercholesteremia   . Hypertension   . Stroke ([place]) 07/31/2018   bilateral vertebrobasilar occlusion; "left side weaker thanit was before."    Past Surgical History:  Procedure Laterality Date  . CARDIAC CATHETERIZATION  10/29/2012  . CORONARY ANGIOPLASTY WITH STENT PLACEMENT  11/05/2012  . DIRECT LARYNGOSCOPY N/A 01/02/2019   Procedure: MICRO DIRECT LARYNGOSCOPY BIOPSY OF LARYNGEAL MASS;  Surgeon: [name], MD;  Location: [facility];  Service: ENT;  Laterality: N/A;  . HEMORRHOID SURGERY  ~ 2010  . INSERTION OF MESH N/A 06/02/2015   Procedure: INSERTION OF MESH;  Surgeon: [name], MD;  Location: [facility];  Service: General;  Laterality: N/A;  . IR ANGIO INTRA EXTRACRAN SEL COM CAROTID  INNOMINATE BILAT MOD SED  08/02/2018  . IR ANGIO VERTEBRAL SEL VERTEBRAL BILAT MOD SED  08/02/2018  . LEFT HEART CATHETERIZATION WITH CORONARY ANGIOGRAM N/A 10/30/2012   Procedure: LEFT HEART CATHETERIZATION WITH CORONARY ANGIOGRAM;  Surgeon: [name], MD;  Location: [facility] CATH LAB;  Service: Cardiovascular;  Laterality: N/A;  . PERCUTANEOUS CORONARY ROTOBLATOR INTERVENTION (PCI-R) N/A 11/05/2012   Procedure: PERCUTANEOUS CORONARY ROTOBLATOR INTERVENTION (PCI-R);  Surgeon: [name], MD;  Location: [facility] CATH LAB;  Service: Cardiovascular;  Laterality: N/A;  . UMBILICAL HERNIA REPAIR N/A 06/02/2015   Procedure: UMBILICAL HERNIORRHAPHY WITH MESH;  Surgeon: [name], MD;  Location: [facility];  Service: General;  Laterality: N/A;    There were no vitals filed for this visit.  Subjective Assessment - 02/05/19 1217    Subjective  "I was having some trouble swallowing green beans and luttuce, but it is better."    Currently in Pain?  No/denies         Prior Functional Status - 02/05/19 1219      Prior Functional Status   Cognitive/Linguistic Baseline  Within functional limits    Type of Home  House     Lives With  Spouse;Other (Comment)    Available Help at Discharge  Family;Available 24 hours/day    Vocation  On disability  General - 02/05/19 1220      General Information   Date of Onset  01/02/19    HPI  [name] is a 59 yo male who was referred for a clinical swallow evaluation by Dr. 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   Type of Study  Bedside Swallow Evaluation    Diet Prior to this Study  Regular;Thin liquids    Temperature Spikes Noted  No    Respiratory Status  Room air    History of Recent Intubation  No    Behavior/Cognition  Alert;Cooperative;Pleasant mood    Oral Cavity Assessment  Within Functional Limits;Other (comment)    Oral Care Completed by SLP  No    Oral Cavity  - Dentition  Missing dentition;Poor condition    Vision  Functional for self-feeding    Self-Feeding Abilities  Able to feed self    Patient Positioning  Upright in chair    Baseline Vocal Quality  Normal    Volitional Cough  Strong    Volitional Swallow  Able to elicit       Oral Motor/Sensory Function - 02/05/19 1223      Oral Motor/Sensory Function   Overall Oral Motor/Sensory Function  Within functional limits      Ice Chips - 02/05/19 1223      Ice Chips   Ice chips  Not tested      Thin Liquid - 02/05/19 1223      Thin Liquid   Thin Liquid  Within functional limits    Presentation  Cup;Self Fed         Solid - 02/05/19 1223      Solid   Solid  Within functional limits    Presentation  Self Fed          SLP Education - 02/05/19 1218    Education Details  Pt provided with written and verbal information regarding: dysphagia in [facility] population, aspiration risks and precautions, sequelae of radiation treatment short and long term, food choices, and swallowing exercises    Person(s) Educated  Patient    Methods  Explanation;Handout    Comprehension  Verbalized understanding;Need further instruction       SLP Short Term Goals - 02/05/19 1251      SLP SHORT TERM GOAL #1   Title  Pt will demonstrate safe and efficient consumption of self regulated regular textures with thin liquids with use of strategies as needed.    Baseline  Currently consuming a variety of regular textures and all liquids    Time  3    Period  Months    Status  New    Target Date  05/05/19      SLP SHORT TERM GOAL #2   Title  Pt will complete pharyngeal swallowing exercises as assigned with use of written cue after initial introduction/model from SLP    Baseline  Introduced this date    Time  3    Period  Months    Status  New    Target Date  05/05/19      SLP SHORT TERM GOAL #3   Title  Pt will verbalize 3 signs/symptoms of aspiration pneumonia with min assist.    Baseline   Introduced this date    Time  3    Period  Months    Status  New    Target Date  05/05/19      SLP SHORT  TERM GOAL #4   Title  Complete MBSS if indicated and/or to obtain a baseline prior to starting chemoradiation    Baseline  has not had objective swallow assessment    Time  3    Period  Months    Status  New    Target Date  05/05/19         Plan - 02/05/19 1241    Clinical Impression Statement Oral motor assessment revealedWNLlingual ROM and WNLlingual strength. Labial ROM was WNLand strength was WNL. Velar ROM appearedWNL. Recommend baseline MBSS to objectively evaluate swallow and establish appropriate exercises as indicated.   Because data states the risk for dysphagia during and after radiation treatment is high due to undergoing radiation tx, SLP taught pt aboutthe possibility of reduced/limited ability for PO intake during rad tx. SLP encouraged pt to continue swallowing POs as far into rad tx as possible, even ingesting POs and/or completing HEP shortly after administration of pain meds.   SLPeducatedpt re: changes to swallowing musculature after rad tx, and why adherence to dysphagia HEP provided today and PO consumption was necessary to inhibit muscular disuse atrophy andmanage the effects of radiationfibrosis following radiationtx. Further education was provided regarding possible acute and late effects of radiation therapy including: xerostomia, dysgeusia, salivary changes, mucositis/esophagitis, dehydration, weight loss, fatigue, dysphagia, trismus, and lymphedema. It is prudent that patient is followed by a dentist to reduce risk of infection, osteoradionecrosis, or other oral issues.   He was seen by Dr. Deloria Lair and Pt will likely have all dentition extracted (no date set). Pt with h/o stroke in July 2020 without resulting dysphagia. He reports some difficulty eating green beans and lettuce, but indicates this has improved and he denies dysphagia at this time. Pt  was noted to clear his throat during our session, but he reports this is baseline. Pt was given a packet of information which included: swallowing exercises, soft food ideas, aspiration precautions, anatomical diagram of oropharynx, food log, and a list of his current medical team. Will plan to complete baseline MBSS and follow per care plan once it has been established (port placement, dental extractions, chemo/radiation). Pt is in agreement with plan of care and he was encouraged to contact me should he have any questions prior to our next visit.    Speech Therapy Frequency  --   1-2x per month after chemoradiation   Duration  Other (comment)   3 months   Treatment/Interventions  Aspiration precaution training;SLP instruction and feedback;Pharyngeal strengthening exercises;Compensatory strategies;Patient/family education;Compensatory techniques;Diet toleration management by SLP   MBSS if indicated   Potential to Achieve Goals  Good    SLP Home Exercise Plan  Pt will complete HEP as assigned to facilitate carryover of treatment strategies and techniques in home environment with written cues    Consulted and Agree with Plan of Care  Patient       Patient will benefit from skilled therapeutic intervention in order to improve the following deficits and impairments:   Dysphagia, oropharyngeal phase    Problem List Patient Active Problem List   Diagnosis Date Noted  . Malignant neoplasm of supraglottis ([place]) 01/22/2019  . Stroke ([place]) 07/31/2018  . Rotator cuff syndrome of right shoulder 02/20/2013  . Bradycardia 11/06/2012  . Coronary artery disease   . Abnormal finding on cardiovascular stress test 09/25/2012  . Hypertension 09/03/2012  . Chest pain 09/03/2012  . Alcohol abuse, daily use 09/03/2012  . Hyperlipidemia 09/03/2012  . Hyponatremia 09/03/2012   Thank you,  Genene Churn, [place]  [place] 02/05/2019, 12:56 PM  [facility] [address] [place], [place], [address] Phone: [phone]   Fax:  [phone]  Name: [name] MRN: [id number] Date of Birth: 17-Apr-1960

## 2019-02-06 ENCOUNTER — Ambulatory Visit: Payer: 59 | Admitting: General Surgery

## 2019-02-06 ENCOUNTER — Encounter: Payer: Self-pay | Admitting: General Surgery

## 2019-02-06 VITALS — BP 137/88 | HR 54 | Temp 98.5°F | Resp 16 | Ht 68.0 in | Wt 176.0 lb

## 2019-02-06 DIAGNOSIS — C321 Malignant neoplasm of supraglottis: Secondary | ICD-10-CM

## 2019-02-06 NOTE — Patient Instructions (Signed)

## 2019-02-07 ENCOUNTER — Ambulatory Visit (HOSPITAL_COMMUNITY): Payer: 59

## 2019-02-07 NOTE — Progress Notes (Signed)
Nutrition Assessment:     59 year old male with new diagnosis of supraglottic carcinoma.  Past medical history of CAD, stroke, GERD, HTN, hypercholesterolemia.  Noted met with surgeon regarding port placement, on hold until abscessed teeth removed.    Spoke with patient via phone to introduce self and role at [facility].  Patient reports that his appetite is good.  Reports that he typically eats eggs, bacon and biscuit for breakfast.  Usually skips lunch but may snack on cookie, boost shake.  Dinner is meat and potato or vegetable.  Usually drinks boost shake sometimes with breakfast as well.  Eats ice cream at night sometimes.  Denies dysphagia with any particular foods.    Noted SLP evaluation.    Medications: prilosec, pepcid  Labs: Na 129, glucose 122  Anthropometrics:   Height: 70.5 inches Weight: 176 lb 2/4 UBW: 175-180 lb per patient  BMI: 26 Stable weight  Estimated Energy Needs  Kcals: 4462-8638 Protein: 120-140 g Fluid: > 2.4 L  NUTRITION DIAGNOSIS: Predicted suboptimal energy intake related to supraglottic cancer and likely side effects from chemotherapy and radiation therapy.    INTERVENTION:  Discussed importance of nutrition during treatment and weight maintenance.   Encouraged foods high in calories and protein.  Also discussed soft moist protein foods. Will mail handout Discussed oral nutrition supplements and encouraged 350 calorie shake or higher.   Will mail contact information    MONITORING, EVALUATION, GOAL: Patient will consume adequate calories and protein during treatment to maintain weight.    NEXT VISIT: to be determined with treatment  [name], [place], [place] Registered Dietitian [phone] (pager)

## 2019-02-07 NOTE — Progress Notes (Signed)
[name]; [id number]; January 30, 1960   HPI Patient is a 59 year old white male who was referred to my care by Dr. Delton Coombes for Port-A-Cath insertion.  He is undergoing chemotherapy for supraglottic carcinoma.  He denies any significant dysphagia.  He is having teeth pulled due to abscesses in the near future.  He has 0 out of 10 pain. Past Medical History:  Diagnosis Date  . Anxiety   . Arthritis    back  . Bradycardia    a. During 11/2012 adm: lopressor decreased.  . Carotid artery stenosis    65% RICA, mild-moderate L caroitd bulb 08/02/18 angiography, six month f/u rec  . 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 Coronary artery disease    a. Diag cath 10/2012 for CP/abnormal nuc -> planned PCI s/p LAD atherectomy/DES placement 11/05/12.  Marland Kitchen GERD (gastroesophageal reflux disease)   . Hypercholesteremia   . Hypertension   . Stroke ([place]) 07/31/2018   bilateral vertebrobasilar occlusion; "left side weaker thanit was before."    Past Surgical History:  Procedure Laterality Date  . CARDIAC CATHETERIZATION  10/29/2012  . CORONARY ANGIOPLASTY WITH STENT PLACEMENT  11/05/2012  . DIRECT LARYNGOSCOPY N/A 01/02/2019   Procedure: MICRO DIRECT LARYNGOSCOPY BIOPSY OF LARYNGEAL MASS;  Surgeon: [name], MD;  Location: [facility];  Service: ENT;  Laterality: N/A;  . HEMORRHOID SURGERY  ~ 2010  . INSERTION OF MESH N/A 06/02/2015   Procedure: INSERTION OF MESH;  Surgeon: [name], MD;  Location: [facility];  Service: General;  Laterality: N/A;  . IR ANGIO INTRA EXTRACRAN SEL COM CAROTID INNOMINATE BILAT MOD SED  08/02/2018  . IR ANGIO VERTEBRAL SEL VERTEBRAL BILAT MOD SED  08/02/2018  . LEFT HEART CATHETERIZATION WITH CORONARY ANGIOGRAM N/A 10/30/2012   Procedure: LEFT HEART CATHETERIZATION WITH CORONARY ANGIOGRAM;  Surgeon: [name], MD;  Location: [facility] CATH LAB;  Service: Cardiovascular;  Laterality: N/A;  . PERCUTANEOUS  CORONARY ROTOBLATOR INTERVENTION (PCI-R) N/A 11/05/2012   Procedure: PERCUTANEOUS CORONARY ROTOBLATOR INTERVENTION (PCI-R);  Surgeon: [name], MD;  Location: [facility] CATH LAB;  Service: Cardiovascular;  Laterality: N/A;  . UMBILICAL HERNIA REPAIR N/A 06/02/2015   Procedure: UMBILICAL HERNIORRHAPHY WITH MESH;  Surgeon: [name], MD;  Location: [facility];  Service: General;  Laterality: N/A;    Family History  Problem Relation Age of Onset  . Hypertension Mother   . Heart disease Father   . Hypertension Father   . Heart disease Sister   . Hypertension Maternal Grandmother   . Hypertension Maternal Grandfather   . Hypertension Paternal Grandmother   . Hypertension Paternal Grandfather     Current Outpatient Medications on File Prior to Visit  Medication Sig Dispense Refill  . amLODipine (NORVASC) 5 MG tablet Take 1 tablet (5 mg total) by mouth daily. 30 tablet 0  . aspirin EC 81 MG tablet Take 81 mg by mouth daily.    Marland Kitchen atorvastatin (LIPITOR) 80 MG tablet Take 1 tablet (80 mg total) by mouth every evening. 90 tablet 3  . clopidogrel (PLAVIX) 75 MG tablet Take 1 tablet (75 mg total) by mouth daily. 90 tablet 3  . DULoxetine (CYMBALTA) 30 MG capsule Take 30 mg by mouth daily.    . famotidine (PEPCID) 20 MG tablet Take 20 mg by mouth at bedtime.    Marland Kitchen lisinopril (ZESTRIL) 20 MG tablet Take 1 tablet (20 mg total) by mouth daily. (  Patient taking differently: Take 20 mg by mouth 2 (two) times daily. ) 30 tablet 0  . metoprolol tartrate (LOPRESSOR) 25 MG tablet TAKE 1/2 TABLET BY MOUTH TWICE DAILY (Patient taking differently: Take 12.5 mg by mouth 2 (two) times daily. ) 30 tablet 3  . nitroGLYCERIN (NITROSTAT) 0.4 MG SL tablet Place 1 tablet (0.4 mg total) under the tongue every 5 (five) minutes as needed for chest pain (CP or SOB). 25 tablet 3  . omeprazole (PRILOSEC) 20 MG capsule Take 20 mg by mouth daily.     . traMADol (ULTRAM) 50 MG tablet     . HYDROcodone-acetaminophen (NORCO) 10-325  MG tablet Take 1 tablet by mouth every 4 (four) hours as needed. (Patient not taking: Reported on 02/06/2019) 50 tablet 0   No current facility-administered medications on file prior to visit.    Allergies  Allergen Reactions  . Prednisone Other (See Comments)    Chest pain    Social History   Substance and Sexual Activity  Alcohol Use Yes  . Alcohol/week: 18.0 standard drinks  . Types: 18 Cans of beer per week    Social History   Tobacco Use  Smoking Status Former Smoker  . Packs/day: 3.00  . Years: 32.00  . Pack years: 96.00  . Types: Cigarettes  . Start date: 08/29/1977  . Quit date: 11/29/2006  . Years since quitting: 12.2  Smokeless Tobacco Former Systems developer  . Types: Chew  Tobacco Comment   11/05/2012 "chewed tobacco when I play ball; aien't chewed since age 56"    Review of Systems  Constitutional: Negative.   HENT: Negative.   Eyes: Positive for blurred vision.  Respiratory: Negative.   Cardiovascular: Negative.   Gastrointestinal: Negative.   Genitourinary: Negative.   Musculoskeletal: Positive for back pain.  Skin: Negative.   Neurological: Negative.   Endo/Heme/Allergies: Negative.   Psychiatric/Behavioral: Negative.     Objective   Vitals:   02/06/19 1036  BP: 137/88  Pulse: (!) 54  Resp: 16  Temp: 98.5 F (36.9 C)  SpO2: 95%    Physical Exam Vitals reviewed.  Constitutional:      Appearance: Normal appearance. He is normal weight. He is not ill-appearing.  HENT:     Head: Normocephalic and atraumatic.  Cardiovascular:     Rate and Rhythm: Normal rate and regular rhythm.     Heart sounds: Normal heart sounds. No murmur. No friction rub. No gallop.   Pulmonary:     Effort: Pulmonary effort is normal. No respiratory distress.     Breath sounds: Normal breath sounds. No stridor. No wheezing, rhonchi or rales.  Skin:    General: Skin is warm and dry.  Neurological:     Mental Status: He is alert and oriented to person, place, and time.     Dr. Tomie China notes reviewed Assessment  Supraglottic carcinoma, need for central venous access Plan   Patient will be scheduled for Port-A-Cath insertion once his abscessed teeth are removed as I do want the port to become secondarily infected.  The risks and benefits of the procedure including bleeding, infection, and pneumothorax were fully explained to the patient, who gave informed consent.  He will contact us as soon as he has had his teeth pulled.  He will need to stop his Plavix 5 days before the procedure.

## 2019-02-07 NOTE — Progress Notes (Signed)
Head and Neck Cancer Location of Tumor / Histology:  01/02/19 FINAL MICROSCOPIC DIAGNOSIS: A. LARYNGEAL MASS, BIOPSY: - Invasive squamous cell carcinoma.  Patient presented with symptoms of: Chronic throat discomfort with posterior laryngeal edema by Dr. Benjamine Mola in 08/2018. He suffered a stroke shortly after did not see Dr. Benjamine Mola again until 12/2018 reporting persistent throat discomfort and occasional dysphagia.   Biopsies of larynx revealed: Invasive squamous cell carcinoma.  Nutrition Status Yes No Comments  Weight changes? []  [x]    Swallowing concerns? []  [x]    PEG? []  [x]     Referrals Yes No Comments  Social Work? []  [x]    Dentistry? [x]  []  He has seen a dentist in [place]. They tell me that he is specialized in patients getting radiation. Dr. Deloria Lair  Swallowing therapy? []  [x]    Nutrition? [x]  []  Jennet Maduro RD 02/07/19  Med/Onc? [x]  []  Dr. Delton Coombes   Safety Issues Yes No Comments  Prior radiation? []  [x]    Pacemaker/ICD? []  [x]    Possible current pregnancy? []  [x]    Is the patient on methotrexate? []  [x]     Tobacco/Marijuana/Snuff/ETOH use: He quit smoking 10 years ago and smoked 2-3 packs daily for 40 years. He drinks 12 beers weekly.   Past/Anticipated interventions by otolaryngology, if any:  01/02/19 Dr. Benjamine Mola PROCEDURE PERFORMED: MicroDirect laryngoscopy with biopsy of laryngeal mass.   Past/Anticipated interventions by medical oncology, if any:  01/30/19 Dr. Delton Coombes Assessment and Plan:  1.  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  Hypermetabolic metastatic lymphadenopathy is noted in the neck bilaterally at level 2 and level 3 positions. -I have recommended concurrent chemoradiation therapy. -I will make a referral to Dr. Francesca Jewett in [place]. -We have already made a referral to oral  surgeon. -We will also make a referral to speech and swallow therapy.  We will also obtain nutrition consult. -We will make referral to Dr. Renold Genta for port placement. -I will see him back in 2 weeks to discuss further if he needs upfront PEG tube placement.  We will discuss this with Dr.Yanagihara and make the decision.   Current Complaints / other details:  01/27/19 PET

## 2019-02-10 ENCOUNTER — Ambulatory Visit
Admission: RE | Admit: 2019-02-10 | Discharge: 2019-02-10 | Disposition: A | Discharge status: Home / Self Care | Payer: 59 | Source: Ambulatory Visit | Attending: Radiation Oncology | Admitting: Radiation Oncology

## 2019-02-10 ENCOUNTER — Other Ambulatory Visit: Payer: Self-pay

## 2019-02-10 ENCOUNTER — Encounter: Payer: Self-pay | Admitting: *Deleted

## 2019-02-10 ENCOUNTER — Encounter: Payer: Self-pay | Admitting: Radiation Oncology

## 2019-02-10 DIAGNOSIS — C321 Malignant neoplasm of supraglottis: Secondary | ICD-10-CM

## 2019-02-10 DIAGNOSIS — Z0271 Encounter for disability determination: Secondary | ICD-10-CM

## 2019-02-10 NOTE — Progress Notes (Signed)
Radiation Oncology         [phone] ________________________________  Initial outpatient Consultation by telephone as patient was unable to access MyChart video during pandemic precautions  Name: [name] MRN: [id number]  Date: 02/10/2019  DOB: 04-26-1960  CC:[name], MD  [name], MD   REFERRING PHYSICIAN: [name], MD  DIAGNOSIS:    ICD-10-CM   1. 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 male who presented with symptoms of: Chronic throat discomfort with posterior laryngeal edema by Dr. Benjamine Mola in 08/2018. He suffered a stroke shortly after did not see Dr. Benjamine Mola again until 12/2018 reporting persistent throat discomfort and occasional dysphagia.  12/26/2018 CT of the neck with contrast revealed asymmetric soft tissue mass within the left area epiglottic fold extending to the supraglottic larynx measuring at least 1.7 cm.  There were enlarged suspicious left level 2 lymph nodes and enlarged level 4 lymph nodes bilaterally of unclear etiology.  Patient has underlying high-grade stenosis of the carotid arteries.  Subsequently, the patient saw Dr. Benjamine Mola 01/02/19 who performed examination under anesthesia notable for a soft tissue mass noted over the left posterior larynx extending from the left arytenoid fold to the left arytenoid cartilage.  No masses appreciated on the true vocal cords, vallecula, piriform sinuses, or epiglottis.  He obtained biopsy revealing  FINAL MICROSCOPIC DIAGNOSIS: A. LARYNGEAL MASS, BIOPSY: - Invasive squamous cell carcinoma.Marland Kitchen  PET scan of skull base to thigh on 01/27/2019 revealed hypermetabolic activity at the laryngeal mass and hypermetabolic level 2 and 3 cervical nodes bilaterally with no evidence of distant metastatic disease. I have personally reviewed his images.  Swallowing issues, if any: no  -following with speech-language pathology Forestine Na  Weight Changes: no -following with nutritionist at [facility]  Pain status: some pain w/ swallowing   Tobacco history, if any: quit smoking 10 year ago; previously heavy  ETOH abuse, if any: 12 drinks / week per patient report  Prior cancers, if any: no  He likes to watch TV. He lives with his wife. Not currently working due to disability due to "bad back" and h/o strokes. He reports that the strokes have left him with leg weakness b/l. Walks with cane. "Blood clots in spine and brain."  He has seen Dr. Deloria Lair, oral surgeon, in [facility] and was told that all of his teeth would need removal due to infection.  Appointment not made to patient's knowledge.   PREVIOUS RADIATION THERAPY: No  PAST MEDICAL HISTORY:  has a past medical history of Anxiety, Arthritis, Bradycardia, Carotid artery stenosis, Chronic lower back pain, Coronary artery disease, GERD (gastroesophageal reflux disease), Hypercholesteremia, Hypertension, and Stroke ([place]) (07/31/2018).    PAST SURGICAL HISTORY: Past Surgical History:  Procedure Laterality Date  . CARDIAC CATHETERIZATION  10/29/2012  . CORONARY ANGIOPLASTY WITH STENT PLACEMENT  11/05/2012  . DIRECT LARYNGOSCOPY N/A 01/02/2019   Procedure: MICRO DIRECT LARYNGOSCOPY BIOPSY OF LARYNGEAL MASS;  Surgeon: [name], MD;  Location: [facility];  Service: ENT;  Laterality: N/A;  . HEMORRHOID SURGERY  ~ 2010  . INSERTION OF MESH N/A 06/02/2015   Procedure: INSERTION OF MESH;  Surgeon: [name], MD;  Location: [facility];  Service: General;  Laterality: N/A;  . IR ANGIO INTRA EXTRACRAN SEL COM CAROTID INNOMINATE BILAT MOD SED  08/02/2018  . IR ANGIO VERTEBRAL SEL VERTEBRAL BILAT MOD SED  08/02/2018  . LEFT HEART CATHETERIZATION WITH CORONARY ANGIOGRAM N/A 10/30/2012   Procedure: LEFT HEART CATHETERIZATION WITH CORONARY ANGIOGRAM;  Surgeon: [name], MD;  Location: [facility] CATH LAB;  Service: Cardiovascular;   Laterality: N/A;  . PERCUTANEOUS CORONARY ROTOBLATOR INTERVENTION (PCI-R) N/A 11/05/2012   Procedure: PERCUTANEOUS CORONARY ROTOBLATOR INTERVENTION (PCI-R);  Surgeon: [name], MD;  Location[facility] C[facility] LAB;  Service: Cardiovascular;  Laterality: N/A;  . UMBILICAL HERNIA REPAIR N/A 06/02/2015   Procedure: UMBILICAL HERNIORRHAPHY WITH MESH;  Surgeon: [name], MD;  Location: [facility];  Service: General;  Laterality: N/A;    FAMILY HISTORY: family history includes Heart disease in his father and sister; Hypertension in his father, maternal grandfather, maternal grandmother, mother, paternal grandfather, and paternal grandmother.  SOCIAL HISTORY:  reports that he quit smoking about 12 years ago. His smoking use included cigarettes. He started smoking about 41 years ago. He has a 96.00 pack-year smoking history. He has quit using smokeless tobacco.  His smokeless tobacco use included chew. He reports current alcohol use of about 12.0 standard drinks of alcohol per week. He reports that he does not use drugs.  ALLERGIES: Prednisone  MEDICATIONS:  Current Outpatient Medications  Medication Sig Dispense Refill  . amLODipine (NORVASC) 5 MG tablet Take 1 tablet (5 mg total) by mouth daily. 30 tablet 0  . aspirin EC 81 MG tablet Take 81 mg by mouth daily.    Marland Kitchen atorvastatin (LIPITOR) 80 MG tablet Take 1 tablet (80 mg total) by mouth every evening. 90 tablet 3  . clopidogrel (PLAVIX) 75 MG tablet Take 1 tablet (75 mg total) by mouth daily. 90 tablet 3  . DULoxetine (CYMBALTA) 30 MG capsule Take 30 mg by mouth daily.    . famotidine (PEPCID) 20 MG tablet Take 20 mg by mouth at bedtime.    Marland Kitchen HYDROcodone-acetaminophen (NORCO) 10-325 MG tablet Take 1 tablet by mouth every 4 (four) hours as needed. 50 tablet 0  . lisinopril (ZESTRIL) 20 MG tablet Take 1 tablet (20 mg total) by mouth daily. (Patient taking differently: Take 20 mg by mouth 2 (two) times daily. ) 30 tablet 0  . metoprolol tartrate  (LOPRESSOR) 25 MG tablet TAKE 1/2 TABLET BY MOUTH TWICE DAILY (Patient taking differently: Take 12.5 mg by mouth 2 (two) times daily. ) 30 tablet 3  . nitroGLYCERIN (NITROSTAT) 0.4 MG SL tablet Place 1 tablet (0.4 mg total) under the tongue every 5 (five) minutes as needed for chest pain (CP or SOB). 25 tablet 3  . omeprazole (PRILOSEC) 20 MG capsule Take 20 mg by mouth daily.     . penicillin v potassium (VEETID) 500 MG tablet     . traMADol (ULTRAM) 50 MG tablet      No current facility-administered medications for this encounter.    REVIEW OF SYSTEMS:  Notable for that above.   PHYSICAL EXAM:  vitals were not taken for this visit.   General: Alert and oriented, in no acute distress   LABORATORY DATA:  Lab Results  Component Value Date   WBC 6.9 01/02/2019   HGB 14.5 01/02/2019   HCT 39.8 01/02/2019   MCV 92.8 01/02/2019   PLT 299 01/02/2019   CMP     Component Value Date/Time   NA 129 (L) 01/02/2019 1106   K 4.4 01/02/2019 1106   CL 96 (L) 01/02/2019 1106   CO2 22 01/02/2019 1106   GLUCOSE 122 (H) 01/02/2019 1106   BUN 12 01/02/2019 1106   BUN  13 12/24/2018 0918   CREATININE 0.99 01/02/2019 1106   CREATININE 1.23 10/28/2012 1558   CALCIUM 9.6 01/02/2019 1106   PROT 8.4 (H) 07/31/2018 1104   ALBUMIN 4.8 07/31/2018 1104   AST 19 07/31/2018 1104   ALT 12 07/31/2018 1104   ALKPHOS 44 07/31/2018 1104   BILITOT 1.0 07/31/2018 1104   GFRNONAA >60 01/02/2019 1106   GFRAA >60 01/02/2019 1106      No results found for: TSH   RADIOGRAPHY: NM PET Image Initial (PI) Skull Base To Thigh  Result Date: 01/27/2019 CLINICAL DATA:  Initial treatment strategy for head neck cancer. EXAM: NUCLEAR MEDICINE PET SKULL BASE TO THIGH TECHNIQUE: 8.9 mCi F-18 FDG was injected intravenously. Full-ring PET imaging was performed from the skull base to thigh after the radiotracer. CT data was obtained and used for attenuation correction and anatomic localization. Fasting blood glucose: 111 mg/dl  COMPARISON:  None. FINDINGS: Mediastinal blood pool activity: SUV max  3 Liver activity: SUV max NA NECK: Abnormal hypermetabolism identified in the left and posterior laryngeal region with SUV max = 21.9 consistent with the patient's known neoplasm. Hypermetabolic metastatic lymphadenopathy is identified in the neck bilaterally at level II and III positions. Index left-sided 11 mm short axis level II node (28/4) demonstrates SUV max = 13.7. Index right-sided 11 mm short axis level III lymph node demonstrates SUV max = 18.4. Incidental CT findings: none CHEST: No hypermetabolic mediastinal or hilar nodes. No suspicious pulmonary nodules on the CT scan. Incidental CT findings: Coronary artery calcification is evident. Atherosclerotic calcification is noted in the wall of the thoracic aorta. ABDOMEN/PELVIS: No abnormal hypermetabolic activity within the liver, pancreas, adrenal glands, or spleen. No hypermetabolic lymph nodes in the abdomen or pelvis. Incidental CT findings: There is abdominal aortic atherosclerosis without aneurysm. SKELETON: No focal hypermetabolic activity to suggest skeletal metastasis. Incidental CT findings: none IMPRESSION: 1. Hypermetabolic laryngeal lesion consistent with the patient's known primary malignancy. 2. Hypermetabolic cervical metastatic disease bilaterally. 3. No evidence for hypermetabolic metastases in the chest, abdomen, or pelvis. Electronically Signed   By: Misty Stanley M.D.   On: 01/27/2019 14:07      IMPRESSION/PLAN:  This is a delightful patient with head and neck cancer.  He is getting set up for chemotherapy and has been referred for consideration of concurrent radiotherapy.  I recommend that he receive 7 weeks of radiotherapy concurrently with curative intent.  He understands that he has a serious disease and that curative treatment will be with significant side effects.  We discussed the potential risks, benefits, and side effects of radiotherapy. We talked in  detail about acute and late effects. We discussed that some of the most bothersome acute effects may be mucositis, dysgeusia, salivary changes, skin irritation, hair loss, dehydration, weight loss and fatigue. We talked about late effects which include but are not necessarily limited to dysphagia, hypothyroidism, nerve injury, spinal cord injury, xerostomia, trismus, and neck edema or other serious injuries in the head and neck region. No guarantees of treatment were given. The patient is enthusiastic about proceeding with treatment. I look forward to participating in the patient's care.    Simulation (treatment planning) will take place once his dental disposition is determined.  Patient anticipates for extractions. [name], RN, our Head and Neck Oncology Navigator is going to determine his disposition.  I will be able to avoid his tooth roots to a significant dose - if dental extractions are going to significantly delay treatment we may need to  move forward with chemoradiotherapy.  We also discussed that the treatment of head and neck cancer is a multidisciplinary process to maximize treatment outcomes and quality of life. For this reason the following referrals have been or will be made:   Medical oncology to discuss chemotherapy    Nutritionist for nutrition support during and after treatment.   Speech language pathology for swallowing and/or speech therapy.   Social work for social support.    Physical therapy due to risk of lymphedema in neck and deconditioning.  He reports that the strokes have left him with leg weakness b/l. Walks with cane.   Baseline labs including TSH.  I asked the patient today about alcohol and tobacco use which are carcinogenic substances and likely contributed to his development of cancer. The patient no longer uses tobacco but still drinks alcohol.  I advised the patient to cut back on his alcohol use until he is abstaining. The patient has follow-up with the  oncologic team to touch base on his cessation efforts.    This encounter was provided by telemedicine platform by telephone as patient was unable to access MyChart video during pandemic precautions The patient has given verbal consent for this type of encounter and has been advised to only accept a meeting of this type in a secure network environment. The time spent in total, of date of service, on this encounter was 50 minutes. The attendants for this meeting include Eppie Gibson  and [employer].  During the encounter, Eppie Gibson was located at [facility] Radiation Oncology Department.  TRAMAIN GERSHMAN was located at home.  __________________________________________   Eppie Gibson, MD

## 2019-02-11 ENCOUNTER — Telehealth: Payer: Self-pay | Admitting: *Deleted

## 2019-02-11 ENCOUNTER — Other Ambulatory Visit: Payer: Self-pay

## 2019-02-11 ENCOUNTER — Encounter: Payer: Self-pay | Admitting: Student

## 2019-02-11 ENCOUNTER — Ambulatory Visit (INDEPENDENT_AMBULATORY_CARE_PROVIDER_SITE_OTHER): Payer: 59 | Admitting: Student

## 2019-02-11 VITALS — BP 118/68 | HR 65 | Wt 177.8 lb

## 2019-02-11 DIAGNOSIS — C321 Malignant neoplasm of supraglottis: Secondary | ICD-10-CM

## 2019-02-11 DIAGNOSIS — I2510 Atherosclerotic heart disease of native coronary artery without angina pectoris: Secondary | ICD-10-CM | POA: Diagnosis not present

## 2019-02-11 DIAGNOSIS — E785 Hyperlipidemia, unspecified: Secondary | ICD-10-CM

## 2019-02-11 DIAGNOSIS — Z8673 Personal history of transient ischemic attack (TIA), and cerebral infarction without residual deficits: Secondary | ICD-10-CM

## 2019-02-11 DIAGNOSIS — I6523 Occlusion and stenosis of bilateral carotid arteries: Secondary | ICD-10-CM

## 2019-02-11 DIAGNOSIS — I10 Essential (primary) hypertension: Secondary | ICD-10-CM

## 2019-02-11 NOTE — Telephone Encounter (Signed)
02-04-19 fax confirmation received for medical clearance form to [name] [phone]

## 2019-02-11 NOTE — Progress Notes (Signed)
Cardiology Office Note    Date:  02/11/2019   ID:  [name], Alferd Apa 1960/02/25, MRN [id number]  PCP:  [name], MD  Cardiologist: Kate Sable, MD    Chief Complaint  Patient presents with  . Follow-up    Overdue Visit    History of Present Illness:    [name] is a 59 y.o. male with past medical history of CAD (s/p rotational atherectomy and DES placement to proximal LAD in 11/2012), carotid artery stenosis, HTN, HLD and prior CVA who presents to the office today for overdue follow-up.   He was last examined by Dr. Bronson Ing in 08/2017 and denied any recent chest pain or dyspnea on exertion. Was still working and playing basketball regularly. He was continued on his current medication regimen at that time with ASA, Atorvastatin 40mg  daily, Lopressor, Lisinopril and Amlodipine.   In the interim, he was admitted to [facility] in 08/2018 for an acute CVA due to infarction of the pons. Was recommended to be on ASA and Plavix for 3 months then Plavix alone. Cerebral angiogram during admission did show bilateral vertebral artery occlusions with proximal RICA 65% stenosis and outpatient follow-up was recommended.    Also, he was evaluated by ENT and Oncology with CT imaging showing a soft tissue mass in the left aryepiglottic fold extending into the supraglottic larynx. 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 It has been recommended he proceed with concurrent chemoradiation. He has been referred to Dr. Arnoldo Morale for port placement following removal of abscessed teeth.    In talking with the patient today, he reports still having residual left sided weakness since his CVA. No longer able to play basketball or go hunting as he previously enjoyed doing. He has follow-up with an oral surgeon in the coming weeks to schedule his extractions and then follow-up with General Surgery for port placement prior to chemotherapy.    While his activity has been more limited, he still does household chores and does his own grocery shopping. Denies any chest pain or dyspnea on exertion with these activities. No recent orthopnea, PND or lower extremity edema.    Past Medical History:  Diagnosis Date  . Anxiety   . Arthritis    back  . Bradycardia    a. During 11/2012 adm: lopressor decreased.  . Carotid artery stenosis    65% RICA, mild-moderate L caroitd bulb 08/02/18 angiography, six month f/u rec  . 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 Coronary artery disease    a. Diag cath 10/2012 for CP/abnormal nuc -> planned PCI s/p LAD atherectomy/DES placement 11/05/12.  Marland Kitchen GERD (gastroesophageal reflux disease)   . Hypercholesteremia   . Hypertension   . Stroke ([place]) 07/31/2018   bilateral vertebrobasilar occlusion; "left side weaker thanit was before."    Past Surgical History:  Procedure Laterality Date  . CARDIAC CATHETERIZATION  10/29/2012  . CORONARY ANGIOPLASTY WITH STENT PLACEMENT  11/05/2012  . DIRECT LARYNGOSCOPY N/A 01/02/2019   Procedure: MICRO DIRECT LARYNGOSCOPY BIOPSY OF LARYNGEAL MASS;  Surgeon: [name], MD;  Location: [place] OR;  Service: ENT;  Laterality: N/A;  . HEMORRHOID SURGERY  ~ 2010  . INSERTION OF MESH N/A 06/02/2015   Procedure: INSERTION OF MESH;  Surgeon: [name], MD;  Location: [facility];  Service: General;  Laterality: N/A;  . IR ANGIO  INTRA EXTRACRAN SEL COM CAROTID INNOMINATE BILAT MOD SED  08/02/2018  . IR ANGIO VERTEBRAL SEL VERTEBRAL BILAT MOD SED  08/02/2018  . LEFT HEART CATHETERIZATION WITH CORONARY ANGIOGRAM N/A 10/30/2012   Procedure: LEFT HEART CATHETERIZATION WITH CORONARY ANGIOGRAM;  Surgeon: [name], MD;  Location: [place] CATH LAB;  Service: Cardiovascular;  Laterality: N/A;  . PERCUTANEOUS CORONARY ROTOBLATOR INTERVENTION (PCI-R) N/A 11/05/2012   Procedure: PERCUTANEOUS CORONARY ROTOBLATOR  INTERVENTION (PCI-R);  Surgeon: [name], MD;  Location: [facility] CATH LAB;  Service: Cardiovascular;  Laterality: N/A;  . UMBILICAL HERNIA REPAIR N/A 06/02/2015   Procedure: UMBILICAL HERNIORRHAPHY WITH MESH;  Surgeon: [name], MD;  Location: [facility];  Service: General;  Laterality: N/A;    Current Medications: Outpatient Medications Prior to Visit  Medication Sig Dispense Refill  . amLODipine (NORVASC) 5 MG tablet Take 1 tablet (5 mg total) by mouth daily. 30 tablet 0  . aspirin EC 81 MG tablet Take 81 mg by mouth daily.    Marland Kitchen atorvastatin (LIPITOR) 80 MG tablet Take 1 tablet (80 mg total) by mouth every evening. 90 tablet 3  . clopidogrel (PLAVIX) 75 MG tablet Take 1 tablet (75 mg total) by mouth daily. 90 tablet 3  . DULoxetine (CYMBALTA) 30 MG capsule Take 30 mg by mouth daily.    . famotidine (PEPCID) 20 MG tablet Take 20 mg by mouth at bedtime.    Marland Kitchen HYDROcodone-acetaminophen (NORCO) 10-325 MG tablet Take 1 tablet by mouth every 4 (four) hours as needed. 50 tablet 0  . lisinopril (ZESTRIL) 20 MG tablet Take 1 tablet (20 mg total) by mouth daily. (Patient taking differently: Take 20 mg by mouth 2 (two) times daily. ) 30 tablet 0  . metoprolol tartrate (LOPRESSOR) 25 MG tablet TAKE 1/2 TABLET BY MOUTH TWICE DAILY (Patient taking differently: Take 12.5 mg by mouth 2 (two) times daily. ) 30 tablet 3  . nitroGLYCERIN (NITROSTAT) 0.4 MG SL tablet Place 1 tablet (0.4 mg total) under the tongue every 5 (five) minutes as needed for chest pain (CP or SOB). 25 tablet 3  . omeprazole (PRILOSEC) 20 MG capsule Take 20 mg by mouth daily.     . penicillin v potassium (VEETID) 500 MG tablet     . traMADol (ULTRAM) 50 MG tablet      No facility-administered medications prior to visit.     Allergies:   Prednisone   Social History   Socioeconomic History  . Marital status: Married    Spouse name: Not on file  . Number of children: 0  . Years of education: Not on file  . Highest education  level: Not on file  Occupational History  . Occupation: Disabled  Tobacco Use  . Smoking status: Former Smoker    Packs/day: 3.00    Years: 32.00    Pack years: 96.00    Types: Cigarettes    Start date: 08/29/1977    Quit date: 11/29/2006    Years since quitting: 12.2  . Smokeless tobacco: Former Systems developer    Types: Chew  . Tobacco comment: 11/05/2012 "chewed tobacco when I play ball; aien't chewed since age 70"  Substance and Sexual Activity  . Alcohol use: Yes    Alcohol/week: 12.0 standard drinks    Types: 12 Cans of beer per week  . Drug use: No  . Sexual activity: Not Currently  Other Topics Concern  . Not on file  Social History Narrative  . Not on file   Social Determinants of  Health   Financial Resource Strain: Low Risk   . Difficulty of Paying Living Expenses: Not hard at all  Food Insecurity: No Food Insecurity  . Worried About Charity fundraiser in the Last Year: Never true  . Ran Out of Food in the Last Year: Never true  Transportation Needs: No Transportation Needs  . Lack of Transportation (Medical): No  . Lack of Transportation (Non-Medical): No  Physical Activity: Insufficiently Active  . Days of Exercise per Week: 2 days  . Minutes of Exercise per Session: 30 min  Stress: Stress Concern Present  . Feeling of Stress : To some extent  Social Connections: Moderately Isolated  . Frequency of Communication with Friends and Family: Once a week  . Frequency of Social Gatherings with Friends and Family: Once a week  . Attends Religious Services: Never  . Active Member of Clubs or Organizations: No  . Attends Archivist Meetings: Never  . Marital Status: Married     Family History:  The patient's family history includes Heart disease in his father and sister; Hypertension in his father, maternal grandfather, maternal grandmother, mother, paternal grandfather, and paternal grandmother.   Review of Systems:   Please see the history of present illness.      General:  No chills, fever, night sweats or weight changes.  Cardiovascular:  No chest pain, dyspnea on exertion, edema, orthopnea, palpitations, paroxysmal nocturnal dyspnea. Dermatological: No rash, lesions/masses Respiratory: No cough, dyspnea Urologic: No hematuria, dysuria Abdominal:   No nausea, vomiting, diarrhea, bright red blood per rectum, melena, or hematemesis Neurologic:  No visual changes, changes in mental status. Positive for left-sided weakness.    All other systems reviewed and are otherwise negative except as noted above.   Physical Exam:    VS:  BP 118/68   Pulse 65   Wt 177 lb 12.8 oz (80.6 kg)   SpO2 97%   BMI 27.03 kg/m    General: Well developed, well nourished,male appearing in no acute distress. Head: Normocephalic, atraumatic, sclera non-icteric, no xanthomas, nares are without discharge.  Neck: No carotid bruits. JVD not elevated.  Lungs: Respirations regular and unlabored, without wheezes or rales.  Heart: Regular rate and rhythm. No S3 or S4.  No murmur, no rubs, or gallops appreciated. Abdomen: Soft, non-tender, non-distended with normoactive bowel sounds. No hepatomegaly. No rebound/guarding. No obvious abdominal masses. Msk:  Strength and tone appear normal for age. No joint deformities or effusions. Extremities: No clubbing or cyanosis. No lower extremity edema.  Distal pedal pulses are 2+ bilaterally. Neuro: Alert and oriented X 3. Moves all extremities spontaneously. No focal deficits noted. Psych:  Responds to questions appropriately with a normal affect. Skin: No rashes or lesions noted  Wt Readings from Last 3 Encounters:  02/11/19 177 lb 12.8 oz (80.6 kg)  02/06/19 176 lb (79.8 kg)  01/22/19 175 lb 4.8 oz (79.5 kg)     Studies/Labs Reviewed:   EKG:  EKG is not ordered today.   Recent Labs: 07/31/2018: ALT 12 01/02/2019: BUN 12; Creatinine, Ser 0.99; Hemoglobin 14.5; Platelets 299; Potassium 4.4; Sodium 129   Lipid Panel     Component Value Date/Time   CHOL 167 12/24/2018 0918   TRIG 67 12/24/2018 0918   HDL 80 12/24/2018 0918   CHOLHDL 2.1 12/24/2018 0918   CHOLHDL 2.7 08/01/2018 0351   VLDL 20 08/01/2018 0351   LDLCALC 74 12/24/2018 0918    Additional studies/ records that were reviewed today include:   Echocardiogram:  07/2018 IMPRESSIONS    1. The left ventricle has normal systolic function, with an ejection  fraction of 55-60%. The cavity size was normal. There is mildly increased  left ventricular wall thickness. Left ventricular diastolic Doppler  parameters are consistent with impaired  relaxation.  2. The right ventricle has normal systolic function. The cavity was  normal. There is no increase in right ventricular wall thickness. Right  ventricular systolic pressure could not be assessed.  3. Aortic valve regurgitation is trivial by color flow Doppler. No  stenosis of the aortic valve.  4. No intracardiac thrombi or masses were visualized.  5. The interatrial septum was not well visualized.  6. The inferior vena cava was normal in size with <50% respiratory  variability.   Assessment:    1. Coronary artery disease involving native coronary artery of native heart without angina pectoris   2. Bilateral carotid artery stenosis   3. Hyperlipidemia LDL goal <70   4. Essential hypertension   5. History of CVA (cerebrovascular accident)   6. Squamous cell carcinoma of supraglottis (HCC)      Plan:   In order of problems listed above:  1. CAD - he is s/p rotational atherectomy and DES placement to proximal LAD in 11/2012. He denies any recent chest pain or dyspnea on exertion. Echocardiogram last year showed a preserved EF with no regional WMA as outlined above.  - he remains on ASA, Plavix (per Neurology), BB and statin therapy.   2. Carotid Artery Stenosis - CTA Neck in 07/2018 showed atheromatous stenoses about the carotid bifurcations/proximal ICAs bilaterally, with up to  70% narrowing on the right and 65% narrowing on the left. He is actually scheduled for repeat carotid dopplers tomorrow per Neurology.  - continue ASA and statin therapy.   3. HLD - FLP in 12/2018 showed total cholesterol of 167, HDL 80 and LDL 74 (previously elevated to 101 in 07/2018). Goal LDL is less than 70 with known CAD and prior CVA. Remains on Atorvastatin 80mg  daily.   4. HTN - BP well-controlled at 118/68 during today's visit. Continue current medication regimen with Amlodipine 5mg  daily, Lisinopril 20mg  daily and Lopressor 12.5mg  BID.   5. History of CVA - he is followed by Neurology. Continues to have residual left-sided weakness. Remains on ASA, Plavix and Atorvastatin.   6. Supraglottic Squamous Cell Carcinoma - being followed by Oncology with plans to start concurrent chemoradiation following port placement.    Medication Adjustments/Labs and Tests Ordered: Current medicines are reviewed at length with the patient today.  Concerns regarding medicines are outlined above.  Medication changes, Labs and Tests ordered today are listed in the Patient Instructions below. Patient Instructions  Medication Instructions:  Your physician recommends that you continue on your current medications as directed. Please refer to the Current Medication list given to you today.  *If you need a refill on your cardiac medications before your next appointment, please call your pharmacy*  Lab Work: NONE  If you have labs (blood work) drawn today and your tests are completely normal, you will receive your results only by: Marland Kitchen MyChart Message (if you have MyChart) OR . A paper copy in the mail If you have any lab test that is abnormal or we need to change your treatment, we will call you to review the results.  Testing/Procedures: NONE   Follow-Up: At [facility], you and your health needs are our priority.  As part of our continuing mission to provide you with exceptional  heart care, we  have created designated Provider Care Teams.  These Care Teams include your primary Cardiologist (physician) and Advanced Practice Providers (APPs -  Physician Assistants and Nurse Practitioners) who all work together to provide you with the care you need, when you need it.  Your next appointment:   1 year(s)  The format for your next appointment:   In Person  Provider:   Kate Sable, MD  Other Instructions Thank you for choosing [facility]!       Signed, Erma Heritage, PA-C  02/11/2019 6:57 PM    [place] [address] [place], [place] [address] Phone: [phone] Fax: [phone]

## 2019-02-11 NOTE — Patient Instructions (Signed)
Medication Instructions:  Your physician recommends that you continue on your current medications as directed. Please refer to the Current Medication list given to you today.  *If you need a refill on your cardiac medications before your next appointment, please call your pharmacy*  Lab Work: NONE  If you have labs (blood work) drawn today and your tests are completely normal, you will receive your results only by: Marland Kitchen MyChart Message (if you have MyChart) OR . A paper copy in the mail If you have any lab test that is abnormal or we need to change your treatment, we will call you to review the results.  Testing/Procedures: NONE   Follow-Up: At [facility], you and your health needs are our priority.  As part of our continuing mission to provide you with exceptional heart care, we have created designated Provider Care Teams.  These Care Teams include your primary Cardiologist (physician) and Advanced Practice Providers (APPs -  Physician Assistants and Nurse Practitioners) who all work together to provide you with the care you need, when you need it.  Your next appointment:   1 year(s)  The format for your next appointment:   In Person  Provider:   Kate Sable, MD  Other Instructions Thank you for choosing [facility]!

## 2019-02-12 ENCOUNTER — Encounter: Payer: Self-pay | Admitting: Radiation Oncology

## 2019-02-12 ENCOUNTER — Other Ambulatory Visit: Payer: Self-pay | Admitting: Radiation Oncology

## 2019-02-12 ENCOUNTER — Ambulatory Visit (HOSPITAL_COMMUNITY)
Admission: RE | Admit: 2019-02-12 | Discharge: 2019-02-12 | Disposition: A | Discharge status: Home / Self Care | Payer: 59 | Source: Ambulatory Visit | Attending: Interventional Radiology | Admitting: Interventional Radiology

## 2019-02-12 DIAGNOSIS — C321 Malignant neoplasm of supraglottis: Secondary | ICD-10-CM

## 2019-02-12 DIAGNOSIS — I771 Stricture of artery: Secondary | ICD-10-CM | POA: Insufficient documentation

## 2019-02-12 DIAGNOSIS — Z1329 Encounter for screening for other suspected endocrine disorder: Secondary | ICD-10-CM

## 2019-02-12 DIAGNOSIS — R531 Weakness: Secondary | ICD-10-CM

## 2019-02-12 NOTE — Progress Notes (Signed)
Bilateral carotid artery duplex exam performed.  Preliminary results can be found under CV proc under chart review.  02/12/2019 3:55 PM  Kalin Kyler, K., RDMS, RVT

## 2019-02-12 NOTE — Progress Notes (Signed)
Dental Form with Estimates of Radiation Dose      Diagnosis: supraglottic cancer  Prognosis: curative  Anticipated # of fractions: 35    Daily?: yes  # of weeks of radiotherapy: 7  Chemotherapy?: yes  Anticipated xerostomia:  Mild permanent   Pre-simulation needs:  Scatter protection is optional and welcomed if it will not delay RT significantly.    Simulation: ASAP   Other Notes:Please contact Eppie Gibson, MD, with patient's disposition after evaluation and/or dental treatment.

## 2019-02-13 ENCOUNTER — Encounter (HOSPITAL_COMMUNITY): Payer: Self-pay | Admitting: Dentistry

## 2019-02-13 ENCOUNTER — Other Ambulatory Visit: Payer: Self-pay

## 2019-02-13 ENCOUNTER — Ambulatory Visit (HOSPITAL_COMMUNITY): Payer: Self-pay | Admitting: Dentistry

## 2019-02-13 VITALS — BP 186/95 | HR 50 | Temp 99.0°F

## 2019-02-13 DIAGNOSIS — Z9189 Other specified personal risk factors, not elsewhere classified: Secondary | ICD-10-CM

## 2019-02-13 DIAGNOSIS — M278 Other specified diseases of jaws: Secondary | ICD-10-CM

## 2019-02-13 DIAGNOSIS — K0889 Other specified disorders of teeth and supporting structures: Secondary | ICD-10-CM

## 2019-02-13 DIAGNOSIS — C321 Malignant neoplasm of supraglottis: Secondary | ICD-10-CM | POA: Diagnosis not present

## 2019-02-13 DIAGNOSIS — K08409 Partial loss of teeth, unspecified cause, unspecified class: Secondary | ICD-10-CM

## 2019-02-13 DIAGNOSIS — K029 Dental caries, unspecified: Secondary | ICD-10-CM

## 2019-02-13 DIAGNOSIS — Z01818 Encounter for other preprocedural examination: Secondary | ICD-10-CM

## 2019-02-13 DIAGNOSIS — M264 Malocclusion, unspecified: Secondary | ICD-10-CM

## 2019-02-13 DIAGNOSIS — K06010 Localized gingival recession, unspecified: Secondary | ICD-10-CM

## 2019-02-13 DIAGNOSIS — K083 Retained dental root: Secondary | ICD-10-CM

## 2019-02-13 DIAGNOSIS — K045 Chronic apical periodontitis: Secondary | ICD-10-CM

## 2019-02-13 DIAGNOSIS — K0530 Chronic periodontitis, unspecified: Secondary | ICD-10-CM

## 2019-02-13 DIAGNOSIS — K036 Deposits [accretions] on teeth: Secondary | ICD-10-CM

## 2019-02-13 NOTE — Addendum Note (Signed)
Addended by: [name] on: 02/13/2019 10:49 AM   Modules accepted: Orders, SmartSet

## 2019-02-13 NOTE — Progress Notes (Signed)
DENTAL CONSULTATION  Date of Consultation:  02/13/2019 Patient Name:   [name] Date of Birth:   [birth year] Medical Record Number: [id number]  COVID 19 SCREENING: The patient does not symptoms concerning for COVID-19 infection (Including fever, chills, cough, or new SHORTNESS OF BREATH).    VITALS: BP (!) 186/95 (BP Location: Left Arm)   Pulse (!) 50   Temp 99 F (37.2 C)   CHIEF COMPLAINT: Patient referred by Dr. Isidore Moos for dental consultation.  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  Patient with anticipated chemoradiation therapy.  Patient is now seen as part of a medically necessary prechemoradiation therapy dental protocol examination.  The patient currently denies acute toothaches, swellings, or abscesses.  The patient was last seen by a dentist, Dr. Deloria Lair, in [place], [place] on 02/04/2019.  Patient was treatment planned for extraction of remaining teeth at that time.  Patient could not follow-up with that dental treatment due to economic concerns.  Patient was subsequently referred to Dental Medicine for evaluation and treatment as indicated. Patient denies having partial dentures.  Patient denies having dental phobia.  PROBLEM LIST: Patient Active Problem List   Diagnosis Date Noted  . Malignant neoplasm of supraglottis ([place]) 01/22/2019    Priority: High  . Stroke ([place]) 07/31/2018  . Rotator cuff syndrome of right shoulder 02/20/2013  . Bradycardia 11/06/2012  . Coronary artery disease   . Abnormal finding on cardiovascular stress test 09/25/2012  . Hypertension 09/03/2012  . Chest pain 09/03/2012  . Alcohol abuse, daily use 09/03/2012  . Hyperlipidemia 09/03/2012  . Hyponatremia 09/03/2012    PMH: Past Medical History:  Diagnosis Date  . Anxiety   . Arthritis    back  . Bradycardia    a. During 11/2012 adm: lopressor decreased.  . Carotid artery stenosis    65% RICA, mild-moderate L  caroitd bulb 08/02/18 angiography, six month f/u rec  . 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 Coronary artery disease    a. Diag cath 10/2012 for CP/abnormal nuc -> planned PCI s/p LAD atherectomy/DES placement 11/05/12.  Marland Kitchen GERD (gastroesophageal reflux disease)   . Hypercholesteremia   . Hypertension   . Stroke ([place]) 07/31/2018   bilateral vertebrobasilar occlusion; "left side weaker thanit was before."    PSH: Past Surgical History:  Procedure Laterality Date  . CARDIAC CATHETERIZATION  10/29/2012  . CORONARY ANGIOPLASTY WITH STENT PLACEMENT  11/05/2012  . DIRECT LARYNGOSCOPY N/A 01/02/2019   Procedure: MICRO DIRECT LARYNGOSCOPY BIOPSY OF LARYNGEAL MASS;  Surgeon: [name], MD;  Location: [facility];  Service: ENT;  Laterality: N/A;  . HEMORRHOID SURGERY  ~ 2010  . INSERTION OF MESH N/A 06/02/2015   Procedure: INSERTION OF MESH;  Surgeon: [name], MD;  Location: [facility];  Service: General;  Laterality: N/A;  . IR ANGIO INTRA EXTRACRAN SEL COM CAROTID INNOMINATE BILAT MOD SED  08/02/2018  . IR ANGIO VERTEBRAL SEL VERTEBRAL BILAT MOD SED  08/02/2018  . LEFT HEART CATHETERIZATION WITH CORONARY ANGIOGRAM N/A 10/30/2012   Procedure: LEFT HEART CATHETERIZATION WITH CORONARY ANGIOGRAM;  Surgeon: [name], MD;  Location: [facility];  Service: Cardiovascular;  Laterality: N/A;  . PERCUTANEOUS CORONARY ROTOBLATOR INTERVENTION (PCI-R) N/A 11/05/2012   Procedure: PERCUTANEOUS CORONARY ROTOBLATOR INTERVENTION (PCI-R);  Surgeon: [name], MD;  Location: [facility];  Service: Cardiovascular;  Laterality: N/A;  . UMBILICAL HERNIA REPAIR N/A 06/02/2015   Procedure: UMBILICAL HERNIORRHAPHY WITH MESH;  Surgeon: [name], MD;  Location: [facility];  Service: General;  Laterality: N/A;    ALLERGIES: Allergies  Allergen Reactions  . Prednisone Other (See Comments)    Chest pain    MEDICATIONS: Current  Outpatient Medications  Medication Sig Dispense Refill  . amLODipine (NORVASC) 5 MG tablet Take 1 tablet (5 mg total) by mouth daily. 30 tablet 0  . aspirin EC 81 MG tablet Take 81 mg by mouth daily.    Marland Kitchen atorvastatin (LIPITOR) 80 MG tablet Take 1 tablet (80 mg total) by mouth every evening. 90 tablet 3  . clopidogrel (PLAVIX) 75 MG tablet Take 1 tablet (75 mg total) by mouth daily. 90 tablet 3  . HYDROcodone-acetaminophen (NORCO) 10-325 MG tablet Take 1 tablet by mouth every 4 (four) hours as needed. (Patient taking differently: Take 1 tablet by mouth every 4 (four) hours as needed (for pain.). ) 50 tablet 0  . lisinopril (ZESTRIL) 20 MG tablet Take 1 tablet (20 mg total) by mouth daily. (Patient taking differently: Take 20 mg by mouth 2 (two) times daily. ) 30 tablet 0  . metoprolol tartrate (LOPRESSOR) 25 MG tablet TAKE 1/2 TABLET BY MOUTH TWICE DAILY (Patient taking differently: Take 12.5 mg by mouth 2 (two) times daily. ) 30 tablet 3  . omeprazole (PRILOSEC) 20 MG capsule Take 20 mg by mouth daily.     . penicillin v potassium (VEETID) 500 MG tablet Take 1,000 mg by mouth daily.     . traMADol (ULTRAM) 50 MG tablet     . nitroGLYCERIN (NITROSTAT) 0.4 MG SL tablet Place 1 tablet (0.4 mg total) under the tongue every 5 (five) minutes as needed for chest pain (CP or SOB). (Patient taking differently: Place 0.4 mg under the tongue every 5 (five) minutes x 3 doses as needed for chest pain (CP or SOB). ) 25 tablet 3   No current facility-administered medications for this visit.    LABS: Lab Results  Component Value Date   WBC 6.9 01/02/2019   HGB 14.5 01/02/2019   HCT 39.8 01/02/2019   MCV 92.8 01/02/2019   PLT 299 01/02/2019      Component Value Date/Time   NA 129 (L) 01/02/2019 1106   K 4.4 01/02/2019 1106   CL 96 (L) 01/02/2019 1106   CO2 22 01/02/2019 1106   GLUCOSE 122 (H) 01/02/2019 1106   BUN 12 01/02/2019 1106   BUN 13 12/24/2018 0918   CREATININE 0.99 01/02/2019 1106    CREATININE 1.23 10/28/2012 1558   CALCIUM 9.6 01/02/2019 1106   GFRNONAA >60 01/02/2019 1106   GFRAA >60 01/02/2019 1106   Lab Results  Component Value Date   INR 1.0 08/02/2018   INR 0.99 11/05/2012   INR 0.98 10/28/2012   No results found for: PTT  SOCIAL HISTORY: Social History   Socioeconomic History  . Marital status: Married    Spouse name: Not on file  . Number of children: 4  . Years of education: Not on file  . Highest education level: Not on file  Occupational History  . Occupation: Disabled  Tobacco Use  . Smoking status: Former Smoker    Packs/day: 3.00    Years: 32.00    Pack years: 96.00    Types: Cigarettes    Start date: 08/29/1977    Quit date: 11/29/2006    Years since quitting: 12.2  . Smokeless tobacco:  Former Systems developer    Types: Chew  . Tobacco comment: 11/05/2012 "chewed tobacco when I play ball; aien't chewed since age 77"  Substance and Sexual Activity  . Alcohol use: Yes    Alcohol/week: 12.0 standard drinks    Types: 12 Cans of beer per week    Comment: Currently 3-4 beers per day. 02/13/19  . Drug use: No  . Sexual activity: Not Currently  Other Topics Concern  . Not on file  Social History Narrative   Patient is disabled.  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   Patient quit smoking 10 to 12 years ago.  Patient previously 3 pack/day use.   Patient currently drinking 3-4 beers per day from 12 pack a day.   Patient denies use of chew or other illicit drugs.   Social Determinants of Health   Financial Resource Strain: Low Risk   . Difficulty of Paying Living Expenses: Not hard at all  Food Insecurity: No Food Insecurity  . Worried About Charity fundraiser in the Last Year: Never true  . Ran Out of Food in the Last Year: Never true  Transportation Needs: No Transportation Needs  . Lack of Transportation (Medical): No  . Lack of Transportation (Non-Medical): No  Physical Activity: Insufficiently Active  . Days  of Exercise per Week: 2 days  . Minutes of Exercise per Session: 30 min  Stress: Stress Concern Present  . Feeling of Stress : To some extent  Social Connections: Moderately Isolated  . Frequency of Communication with Friends and Family: Once a week  . Frequency of Social Gatherings with Friends and Family: Once a week  . Attends Religious Services: Never  . Active Member of Clubs or Organizations: No  . Attends Archivist Meetings: Never  . Marital Status: Married  Human resources officer Violence: Not At Risk  . Fear of Current or Ex-Partner: No  . Emotionally Abused: No  . Physically Abused: No  . Sexually Abused: No    FAMILY HISTORY: Family History  Problem Relation Age of Onset  . Hypertension Mother   . Heart disease Father   . Hypertension Father   . Heart disease Sister   . Hypertension Maternal Grandmother   . Hypertension Maternal Grandfather   . Hypertension Paternal Grandmother   . Hypertension Paternal Grandfather     REVIEW OF SYSTEMS: Reviewed with the patient as per History of present illness. Psych: Patient denies having dental phobia.  DENTAL HISTORY: CHIEF COMPLAINT: Patient referred by Dr. Isidore Moos for dental consultation.  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  Patient with anticipated chemoradiation therapy.  Patient is now seen as part of a medically necessary prechemoradiation therapy dental protocol examination.  The patient currently denies acute toothaches, swellings, or abscesses.  The patient was last seen by a dentist, Dr. Deloria Lair, in [place], [place] on 02/04/2019.  Patient was treatment planned for extraction of remaining teeth at that time.  Patient could not follow-up with that dental treatment due to economic concerns.  Patient was subsequently referred to Dental Medicine for evaluation and treatment as indicated. Patient denies having partial dentures.  Patient  denies having dental phobia.  DENTAL EXAMINATION: GENERAL: The patient is a well-developed, well-nourished male in no acute distress.        HEAD AND NECK: There is no palpable neck lymphadenopathy.  The patient denies acute TMJ symptoms.  Maximum interincisal opening is measured  at 32 mm from the edentulous maxillary alveolar ridge to the edge of tooth #24. INTRAORAL EXAM: Patient has normal saliva.  There is no evidence of oral abscess formation.  Patient has a buccal exostosis in the area of tooth #15 through 16. DENTITION: Patient is missing tooth numbers 1-4, 8, 9, 12, 13, 15-19, and 30.  There are retained root segments in area of tooth numbers 5, 7, 10, and 29. PERIODONTAL: Patient has chronic periodontitis with plaque and calculus accumulations, generally gingival recession, and moderate to severe bone loss.  Tooth mobility is noted as per dental charting form. DENTAL CARIES/SUBOPTIMAL RESTORATIONS: Multiple dental caries and suboptimal dental restorations are noted. ENDODONTIC: Patient currently denies acute vulvitis symptoms.  Patient does have multiple areas of surgical pathology and radiolucency. CROWN AND BRIDGE: There are no chronic bridge restorations noted. PROSTHODONTIC: Patient denies having partial dentures. OCCLUSION: Patient has a poor occlusal scheme secondary to multiple missing teeth, multiple retained root segments, supra eruption drifting of the unopposed teeth into the edentulous areas, and acquired class III malocclusion.  RADIOGRAPHIC INTERPRETATION: An orthopantogram was taken today.  I did obtain a full series of radiographs from Dr. Deloria Lair dated 02/04/2019 for review. There are multiple missing teeth.  There are multiple retained root segments.  Multiple dental caries are noted Multiple suboptimal dental restorations are noted.  There is moderate to severe bone loss noted.  Radiographic calculus is noted. Multiple areas of periapical pathology and radiolucency are noted.   There is supra eruption and drifting of the unopposed teeth into the edentulous areas.   ASSESSMENTS: 1.  Squamous cell carcinoma of the supraglottis 2.  Prechemoradiation therapy dental protocol 3.  Chronic apical periodontitis 4.  Multiple retained root segments 5.  Dental caries 6.  Suboptimal dental restorations 7.  Chronic periodontitis with bone loss 8.  Gingival recession 9.  Accretions 10.  Tooth mobility 11.  Buccal exostoses in the area of tooth numbers 15 through 16. 12.  Multiple missing teeth 13.  Supra eruption drifting of the unopposed teeth into the edentulous areas 14.  Poor occlusal scheme and malocclusion 15.  Risk for bleeding with invasive dental procedures due to current Plavix therapy. 57.  Risk for complications up to and including stroke and death with anticipated invasive dental procedures in the operating room with general anesthesia.   PLAN/RECOMMENDATIONS: 1. I discussed the risks, benefits, and complications of various treatment options with the patient in relationship to his medical and dental conditions, anticipated chemoradiation therapy, and chemoradiation therapy side effects to include xerostomia, radiation caries, trismus, mucositis, taste changes, gum and jaw bone changes, and risk for infection and osteoradionecrosis.  We discussed various treatment options to include no treatment, multiple extractions with alveoloplasty, pre-prosthetic surgery as indicated, periodontal therapy, dental restorations, root canal therapy, crown and bridge therapy, implant therapy, and replacement of missing teeth as indicated.  We also discussed referral to an oral surgeon.  Patient currently refuses referral to an oral surgeon. Patient agrees to proceed with extraction of remaining teeth with alveoloplasty and preprosthetic surgery as needed in the operating room with general anesthesia with Dental Medicine.  We discussed discontinuation of Plavix therapy prior to the  surgery with Dr. Arnoldo Morale for the Port-A-Cath and prior to dental surgery.  I had a conversation with Dr. Arnoldo Morale and he indicated that he would be discontinuing the Plavix therapy for 5 days prior to his Port-A-Cath surgery.  We discussed coordination of the dental surgery followed by the Port-A-Cath surgery.  It  was determined that the dental surgery would take place on Tuesday, 02/17/2018 followed by Port-A-Cath surgery on 0/60/1561 barring complications from the dental surgery.  It was determined that the Plavix therapy would be discontinued at this time to allow for dental extractions on Tuesday and the Port-A-Cath surgery on Friday.  Patient expressed understanding with the potential risk for another stroke while off of the Plavix therapy and agreed to this discontinuation at this time.  Patient is to remain on his daily aspirin therapy.  After adequate healing from the dental extractions and Port-A-Cath, the patient will begin with chemoradiation therapy treatments.  Patient will then follow-up with a dentist of his choice for fabrication of upper and lower complete dentures approximately 3 months after the last chemoradiation therapy has been provided.   2. Discussion of findings with medical team and coordination of future medical and dental care as needed.  I spent in excess of  120 minutes during the conduct of this consultation and >50% of this time involved direct face-to-face encounter for counseling and/or coordination of the patient's care.    [name], [name]

## 2019-02-13 NOTE — Patient Instructions (Signed)
COVID-19 Education: The signs and symptoms of COVID-19 were discussed with the patient and how to seek care for testing (follow up with PCP or arrange E-visit).   The importance of social distancing was discussed today.  COVID-19 Vaccine Information can be found at: [email or web address].co.uk For questions related to vaccine distribution or appointments, please email [email or web address] or call [phone].   RADIATION THERAPY AND DECISIONS REGARDING YOUR TEETH  Xerostomia (dry mouth) Your salivary glands may be in the filed of radiation.  Radiation may include all or part of your saliva glands.  This will cause your saliva to dry up and you will have a dry mouth.  The dry mouth will be for the rest of your life unless your radiation oncologist tells you otherwise.  Your saliva has many functions:  Saliva wets your tongue for speaking.  It coats your teeth and the inside of your mouth for easier movement.  It helps with chewing and swallowing food.  It helps clean away harmful acid and toxic products made by the germs in your mouth, therefore it helps prevent cavities.  It kills some germs in your mouth and helps to prevent gum disease.  It helps to carry flavor to your taste buds.  Once you have lost your saliva you will be at higher risk for tooth decay and gum disease.  What can be done to help improve your mouth when there's not enough saliva:  1.  Your dentist may give a prescription for Salagen.  It will not bring back all of your saliva but may bring back some of it.  Also your saliva may be thick and ropy or white and foamy. It will not feel like it use to feel.  2.  You will need to swish with water every time your mouth feels dry.  YOU CANNOT suck on any cough drops, mints, lemon drops, candy, vitamin C or any other products.  You cannot use anything other than water to make your mouth feel less dry.  If you want to drink  anything else you have to drink it all at once and brush afterwards.  Be sure to discuss the details of your diet habits with your dentist or hygienist.  Radiation caries: This is decay that happens very quickly once your mouth is very dry due to radiation therapy.  Normally cavities take six months to two years to become a problem.  When you have dry mouth cavities may take as little as eight weeks to cause you a problem.  This is why dental check ups every two months are necessary as long as you have a dry mouth. Radiation caries typically, but not always, start at your gum line where it is hard to see the cavity.  It is therefore also hard to fill these cavities adequately.  This high rate of cavities happens because your mouth no longer has saliva and therefore the acid made by the germs starts the decay process.  Whenever you eat anything the germs in your mouth change the food into acid.  The acid then burns a small hole in your tooth.  This small hole is the beginning of a cavity.  If this is not treated then it will grow bigger and become a cavity.  The way to avoid this hole getting bigger is to use fluoride every evening as prescribed by your dentist.  You have to make sure that your teeth are very clean before you use the fluoride.  This fluoride in turn  will strengthen your teeth and prepare them for another day of fighting acid.  If you develop radiation caries many times the damage is so large that you will have to have all your teeth removed.  This could be a big problem if some of these teeth are in the field of radiation.  Further details of why this could be a big problem will follow.  (See Osteoradionecrosis).  Loss of taste (dysgeusia) This happens to varying degrees once you've had radiation therapy to your jaw region.  Many times taste is not completely lost but becomes limited.  The loss of taste is mostly due to radiation affecting your taste buds.  However if you have no saliva in  your mouth to carry the flavor to your taste buds it would be difficult for your taste buds to taste anything.  That is why using water or a prescription for Salagen prior to meals and during meals may help with some of the taste.  Keep in mind that taste generally returns very slowly over the course of several months or several years after radiation therapy.  Don't give up hope.  Trismus According to your Radiation Oncologist your TMJ or jaw joints are going to be partially or fully in the field of radiation.  This means that over time the muscles that help you open and close your mouth may get stiff.  This will potentially result in your not being able to open your mouth wide enough or as wide as you can open it now.  Le me give you an example of how slowly this happens and how unaware people are of it.  A gentlemen that had radiation therapy two years ago came back to me complaining that bananas are just too large for him to be able to fit them in between his teeth.  He was not able to open wide enough to bite into a banana.  This happens slowly and over a period of time.  What do we do to try and prevent this?  Your dentist will probably give you a stack of sticks called a trismus exercise device .  This stack will help your remind your muscles and your jaw joint to open up to the same distance every day.  Use these sticks every morning when you wake up according to the instructions given by the dentist.   You must use these sticks for at least one to two years after radiation therapy.  The reason for that is because it happens so slowly and keeps going on for about two years after radiation therapy.  Your hospital dentist will help you monitor your mouth opening and make sure that it's not getting smaller.  Osteoradionecrosis (ORN) This is a condition where your jaw bone after having had radiation therapy becomes very dry.  It has very little blood supply to keep it alive.  If you develop a cavity that  turns into an abscess or an infection then the jaw bone does not have enough blood supply to help fight the infection.  At this point it is very likely that the infection could cause the death of your jaw bone.  When you have dead bone it has to be removed.  Therefore you might end up having to have surgery to remove part of your jaw bone, the part of the jaw bone that has been affected.   Healing is also a problem if you are to have surgery in the areas where the bone  has had radiation therapy.  The same reasons apply.  If you have surgery you need more blood supply which is not available.  When blood supply and oxygen are not available again, there is a chance for the bone to die.  Occasionally ORN happens on its own with no obvious reason.  This is quite rare.  We believe that patients who continue to smoke and/or drink alcohol have a higher chance of having this bone problem.  Therefore once your jaw bone has had radiation therapy if there are any teeth in that area, you should never have them pulled.  You should also never have any surgery on your teeth or gums in that area unless the oral surgeon or Periodontist is aware of your history of radiation. There is some expensive management techniques that might be used to limit your risks.  The risks for ORN either from infection or spontaneous ( or on it's own) are life long.    TRISMUS  Trismus is a condition where the jaw does not allow the mouth to open as wide as it usually does.  This can happen almost suddenly, or in other cases the process is so slow, it is hard to notice it-until it is too far along.  When the jaw joints and/or muscles have been exposed to radiation treatments, the onset of Trismus is very slow.  This is because the muscles are losing their stretching ability over a long period of time, as long as 2 YEARS after the end of radiation.  It is therefore important to exercise these muscles and joints.  TRISMUS EXERCISES   Stack of  tongue depressors measuring the same or a little less than the last documented MIO (Maximum Interincisal Opening).  Secure them with a rubber band on both ends.  Place the stack in the patient's mouth, supporting the other end.  Allow 30 seconds for muscle stretching.  Rest for a few seconds.  Repeat 3-5 times  For all radiation patients, this exercise is recommended in the mornings and evenings unless otherwise instructed.  The exercise should be done for a period of 2 YEARS after the end of radiation.  MIO should be checked routinely on recall dental visits by the general dentist or the hospital dentist.  The patient is advised to report any changes, soreness, or difficulties encountered when doing the exercises.

## 2019-02-14 ENCOUNTER — Encounter (HOSPITAL_COMMUNITY): Payer: Self-pay | Admitting: Anesthesiology

## 2019-02-14 ENCOUNTER — Ambulatory Visit (HOSPITAL_COMMUNITY)
Admission: RE | Admit: 2019-02-14 | Discharge: 2019-02-14 | Disposition: A | Discharge status: Home / Self Care | Payer: 59 | Source: Ambulatory Visit | Attending: Internal Medicine | Admitting: Internal Medicine

## 2019-02-14 DIAGNOSIS — M549 Dorsalgia, unspecified: Secondary | ICD-10-CM

## 2019-02-14 NOTE — Progress Notes (Signed)
Oncology Nurse Navigator Documentation  Met with [name] during Teleconsult with Dr. 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 . Introduced myself as his Navigator, explained my role as a member of the Care Team, provided contact information.   Marland Kitchen He voiced understanding of:  Treatment plan for chemoRT - chemotherapy with Dr. Delton Coombes at [facility], RT with Dr. Isidore Moos at [facility].  Referral to be made to [facility] to expedite dental extractions.  CT SIM to be scheduled s/p dental extractions.  Need for port and PEG to optimize systemic therapy and nutritional/hydration needs, respectively.  I encouraged him to call me with questions/concerns moving forward.  Navigator Initial Assessment . Employment Status: disability . Currently on FMLA / STD: no . Living Situation: lives with wife and mother-in-law . Support System: same . PCP: yes . PCD: no . Financial Concerns:no . Transportation Needs: no, able to drive self, family available . Sensory Deficits: no . Language Barriers/Interpreter Needed:  no . Ambulation Needs: yes . DME Used in Home: uses cane when leaves house . Psychosocial Needs:  no . Concerns/Needs Understanding Cancer:  addressed/answered by navigator to best of ability . Self-Expressed Needs: no  [name], RN, BSN Head & Neck Oncology Nurse [place] at [place] [phone]

## 2019-02-17 ENCOUNTER — Other Ambulatory Visit (HOSPITAL_COMMUNITY)
Admission: RE | Admit: 2019-02-17 | Discharge: 2019-02-17 | Disposition: A | Discharge status: Home / Self Care | Payer: 59 | Source: Ambulatory Visit | Attending: Dentistry | Admitting: Dentistry

## 2019-02-17 ENCOUNTER — Inpatient Hospital Stay (HOSPITAL_COMMUNITY): Payer: 59 | Attending: Hematology | Admitting: Hematology

## 2019-02-17 ENCOUNTER — Encounter (HOSPITAL_COMMUNITY): Payer: Self-pay

## 2019-02-17 ENCOUNTER — Encounter (HOSPITAL_COMMUNITY)
Admission: RE | Admit: 2019-02-17 | Discharge: 2019-02-17 | Disposition: A | Discharge status: Home / Self Care | Payer: 59 | Source: Ambulatory Visit | Attending: Dentistry | Admitting: Dentistry

## 2019-02-17 ENCOUNTER — Other Ambulatory Visit: Payer: Self-pay

## 2019-02-17 DIAGNOSIS — Z7901 Long term (current) use of anticoagulants: Secondary | ICD-10-CM | POA: Diagnosis not present

## 2019-02-17 DIAGNOSIS — Z8673 Personal history of transient ischemic attack (TIA), and cerebral infarction without residual deficits: Secondary | ICD-10-CM | POA: Insufficient documentation

## 2019-02-17 DIAGNOSIS — I2510 Atherosclerotic heart disease of native coronary artery without angina pectoris: Secondary | ICD-10-CM | POA: Insufficient documentation

## 2019-02-17 DIAGNOSIS — E7800 Pure hypercholesterolemia, unspecified: Secondary | ICD-10-CM | POA: Insufficient documentation

## 2019-02-17 DIAGNOSIS — Z930 Tracheostomy status: Secondary | ICD-10-CM | POA: Diagnosis not present

## 2019-02-17 DIAGNOSIS — Z7982 Long term (current) use of aspirin: Secondary | ICD-10-CM | POA: Insufficient documentation

## 2019-02-17 DIAGNOSIS — I10 Essential (primary) hypertension: Secondary | ICD-10-CM | POA: Insufficient documentation

## 2019-02-17 DIAGNOSIS — K219 Gastro-esophageal reflux disease without esophagitis: Secondary | ICD-10-CM | POA: Insufficient documentation

## 2019-02-17 DIAGNOSIS — C329 Malignant neoplasm of larynx, unspecified: Secondary | ICD-10-CM | POA: Insufficient documentation

## 2019-02-17 DIAGNOSIS — Z87891 Personal history of nicotine dependence: Secondary | ICD-10-CM | POA: Insufficient documentation

## 2019-02-17 DIAGNOSIS — C321 Malignant neoplasm of supraglottis: Secondary | ICD-10-CM | POA: Diagnosis not present

## 2019-02-17 DIAGNOSIS — K0530 Chronic periodontitis, unspecified: Secondary | ICD-10-CM | POA: Insufficient documentation

## 2019-02-17 DIAGNOSIS — Z79899 Other long term (current) drug therapy: Secondary | ICD-10-CM | POA: Insufficient documentation

## 2019-02-17 DIAGNOSIS — Z01812 Encounter for preprocedural laboratory examination: Secondary | ICD-10-CM | POA: Insufficient documentation

## 2019-02-17 DIAGNOSIS — Z20822 Contact with and (suspected) exposure to covid-19: Secondary | ICD-10-CM | POA: Insufficient documentation

## 2019-02-17 HISTORY — DX: Malignant neoplasm of larynx, unspecified: C32.9

## 2019-02-17 HISTORY — DX: Personal history of other diseases of the digestive system: Z87.19

## 2019-02-17 LAB — COMPREHENSIVE METABOLIC PANEL
ALT: 15 U/L (ref 0–44)
AST: 20 U/L (ref 15–41)
Albumin: 3.9 g/dL (ref 3.5–5.0)
Alkaline Phosphatase: 61 U/L (ref 38–126)
Anion gap: 11 (ref 5–15)
BUN: 11 mg/dL (ref 6–20)
CO2: 23 mmol/L (ref 22–32)
Calcium: 9.7 mg/dL (ref 8.9–10.3)
Chloride: 98 mmol/L (ref 98–111)
Creatinine, Ser: 0.99 mg/dL (ref 0.61–1.24)
GFR calc Af Amer: >60 mL/min (ref >60)
GFR calc non Af Amer: >60 mL/min (ref >60)
Glucose, Bld: 101 mg/dL — ABNORMAL HIGH (ref 70–99)
Potassium: 4.5 mmol/L (ref 3.5–5.1)
Sodium: 132 mmol/L — ABNORMAL LOW (ref 135–145)
Total Bilirubin: 0.9 mg/dL (ref 0.3–1.2)
Total Protein: 6.9 g/dL (ref 6.5–8.1)

## 2019-02-17 LAB — CBC
HCT: 35.2 % — ABNORMAL LOW (ref 39.0–52.0)
Hemoglobin: 11.9 g/dL — ABNORMAL LOW (ref 13.0–17.0)
MCH: 32 pg (ref 26.0–34.0)
MCHC: 33.8 g/dL (ref 30.0–36.0)
MCV: 94.6 fL (ref 80.0–100.0)
Platelets: 269 10*3/uL (ref 150–400)
RBC: 3.72 MIL/uL — ABNORMAL LOW (ref 4.22–5.81)
RDW: 13.1 % (ref 11.5–15.5)
WBC: 7.5 10*3/uL (ref 4.0–10.5)
nRBC: 0 % (ref 0.0–0.2)

## 2019-02-17 LAB — SARS CORONAVIRUS 2 (TAT 6-24 HRS): SARS Coronavirus 2: NEGATIVE

## 2019-02-17 NOTE — Progress Notes (Signed)
   02/17/19 1346  OBSTRUCTIVE SLEEP APNEA  Have you ever been diagnosed with sleep apnea through a sleep study? No  Do you snore loudly (loud enough to be heard through closed doors)?  1  Do you often feel tired, fatigued, or sleepy during the daytime (such as falling asleep during driving or talking to someone)? 0  Has anyone observed you stop breathing during your sleep? 1  Do you have, or are you being treated for high blood pressure? 1  BMI more than 35 kg/m2? 0  Age > 50 (1-yes) 1  Neck circumference greater than:Male 16 inches or larger, Male 17inches or larger? 0  Male Gender (Yes=1) 1  Obstructive Sleep Apnea Score 5  Score 5 or greater  Results sent to PCP

## 2019-02-17 NOTE — Patient Instructions (Signed)
[place] at [facility] Discharge Instructions  You were seen today by Dr. Delton Coombes. He went over how you've been feeling lately. He discussed chemo and radiation therapy with you. He will see you back in 2 weeks for labs, treatment and follow up.   Thank you for choosing [place] at [facility] to provide your oncology and hematology care.  To afford each patient quality time with our provider, please arrive at least 15 minutes before your scheduled appointment time.   If you have a lab appointment with the [facility] please come in thru the  Main Entrance and check in at the main information desk  You need to re-schedule your appointment should you arrive 10 or more minutes late.  We strive to give you quality time with our providers, and arriving late affects you and other patients whose appointments are after yours.  Also, if you no show three or more times for appointments you may be dismissed from the clinic at the providers discretion.     Again, thank you for choosing [facility].  Our hope is that these requests will decrease the amount of time that you wait before being seen by our physicians.       _____________________________________________________________  Should you have questions after your visit to [facility], please contact our office at [phone] between the hours of 8:00 a.m. and 4:30 p.m.  Voicemails left after 4:00 p.m. will not be returned until the following business day.  For prescription refill requests, have your pharmacy contact our office and allow 72 hours.    Cancer Center Support Programs:   > Cancer Support Group  2nd Tuesday of the month 1pm-2pm, Journey Room

## 2019-02-17 NOTE — Anesthesia Preprocedure Evaluation (Addendum)
Anesthesia Evaluation  Patient identified by MRN, date of birth, ID band Patient awake    Reviewed: Allergy & Precautions, NPO status , Patient's Chart, lab work & pertinent test results  Airway Mallampati: II  TM Distance: >3 FB Neck ROM: Full    Dental no notable dental hx.    Pulmonary COPD, former smoker,  Quit smoking 2008, 96 pack year history Laryngeal ca s/p microlaryngoscopy, neck mass biopsy on 01/02/19.  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 Dental extractions/alveoloplasty recommended prior to chemoradiation.   Asymmetric soft tissue mass lesion within the left aryepiglottic fold extending to the supraglottic larynx measures at least 1.7 x 1.2 x 0.7 cm.    Pulmonary exam normal breath sounds clear to auscultation       Cardiovascular hypertension, Pt. on medications + CAD and +CHF  Normal cardiovascular exam Rhythm:Regular Rate:Normal  Diag cath 10/2012 for CP/abnormal nuc -> planned PCI s/p LAD atherectomy/DES placement 11/05/12.  Last echo 07/2018: 1. The left ventricle has normal systolic function, with an ejection  fraction of 55-60%. The cavity size was normal. There is mildly increased  left ventricular wall thickness. Left ventricular diastolic Doppler  parameters are consistent with impaired  relaxation.  2. The right ventricle has normal systolic function. The cavity was  normal. There is no increase in right ventricular wall thickness. Right  ventricular systolic pressure could not be assessed.  3. Aortic valve regurgitation is trivial by color flow Doppler. No  stenosis of the aortic valve.  4. No intracardiac thrombi or masses were visualized.  5. The interatrial septum was not well visualized.  6. The inferior vena cava was normal in size with <50% respiratory  variability.   Plavix was held following 02/13/19 dental visit.  Reportedly, he is to continue ASA.    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  - Left Carotid: Velocities in the left ICA are consistent with a 1-39% stenosis.  CVA, Residual Symptoms    GI/Hepatic hiatal hernia, GERD  ,(+)     substance abuse  alcohol use and marijuana use,   Endo/Other  negative endocrine ROS  Renal/GU negative Renal ROS  negative genitourinary   Musculoskeletal  (+) Arthritis , Osteoarthritis,    Abdominal   Peds  Hematology negative hematology ROS (+)   Anesthesia Other Findings   Reproductive/Obstetrics                             Anesthesia Physical Anesthesia Plan  ASA: IV  Anesthesia Plan: General   Post-op Pain Management:    Induction: Intravenous  PONV Risk Score and Plan: 2 and Ondansetron, Treatment may vary due to age or medical condition and Midazolam  Airway Management Planned: Nasal ETT and Video Laryngoscope Planned  Additional Equipment: None  Intra-op Plan:   Post-operative Plan: Extubation in OR  Informed Consent: I have reviewed the patients History and Physical, chart, labs and discussed the procedure including the risks, benefits and alternatives for the proposed anesthesia with the patient or authorized representative who has indicated his/her understanding and acceptance.     Dental advisory given  Plan Discussed with: CRNA  Anesthesia Plan Comments: (Has been off plavix for 5 days- will use affrin per nares)  Anesthesia Quick Evaluation                                  Anesthesia Evaluation  Patient identified by MRN, date of birth, ID band Patient awake    Reviewed: Allergy & Precautions, NPO status , Patient's Chart, lab work & pertinent test results  Airway Mallampati: I  TM  Distance: >3 FB Neck ROM: Full    Dental no notable dental hx. (+) Poor Dentition,    Pulmonary former smoker,    Pulmonary exam normal breath sounds clear to auscultation       Cardiovascular hypertension, Pt. on medications + CAD and + Cardiac Stents  Normal cardiovascular exam Rhythm:Regular Rate:Normal     Neuro/Psych Anxiety L sided weakeness CVA    GI/Hepatic Neg liver ROS, GERD  Medicated,  Endo/Other  negative endocrine ROS  Renal/GU Cr 1.05     Musculoskeletal   Abdominal   Peds  Hematology negative hematology ROS (+)   Anesthesia Other Findings   Reproductive/Obstetrics                           Anesthesia Physical Anesthesia Plan  ASA: III  Anesthesia Plan: General   Post-op Pain Management:    Induction: Intravenous  PONV Risk Score and Plan: Treatment may vary due to age or medical condition and Ondansetron  Airway Management Planned: Oral ETT  Additional Equipment:   Intra-op Plan:   Post-operative Plan: Extubation in OR  Informed Consent: I have reviewed the patients History and Physical, chart, labs and discussed the procedure including the risks, benefits and alternatives for the proposed anesthesia with the patient or authorized representative who has indicated his/her understanding and acceptance.     Dental advisory given  Plan Discussed with: CRNA  Anesthesia Plan Comments: (PAT note written 01/01/2019 by Myra Gianotti, PA-C. SAME DAY WORK-UP   )       Anesthesia Quick Evaluation

## 2019-02-17 NOTE — Pre-Procedure Instructions (Addendum)
[name]  02/17/2019    Your procedure is scheduled on Tuesday, February 18, 2019 at 7:30 AM.   Report to [facility] Entrance "A" Admitting Office at 5:30 AM.   Call this number if you have problems the morning of surgery: [phone]   Remember:  Do not eat or drink after midnight tonight.  Take these medicines the morning of surgery with A SIP OF WATER: Amlodipine (Norvasc), Metoprolol (Lopressor), Omeprazole (Prilosec), Tramadol (Ultram) or Hydrocodone - if needed, Nitroglycerin - if needed  Continue taking Aspirin. Stop Plavix as instructed by Dr. Enrique Sack and don't restart it until after your Port-a-cath surgery on 02/21/19. Do not use NSAIDS (Ibuprofen, Aleve, etc), other Aspirin products (BC Powders, Goody's, etc), Multivitamins, Herbal medications or Fish Oil prior to surgery.    Do not wear jewelry.  Do not wear lotions, powders, cologne or deodorant.  Men may shave face and neck.  Do not bring valuables to the hospital.  [facility] is not responsible for any belongings or valuables.  Contacts, dentures or bridgework may not be worn into surgery.  Leave your suitcase in the car.  After surgery it may be brought to your room.  For patients admitted to the hospital, discharge time will be determined by your treatment team.  Patients discharged the day of surgery will not be allowed to drive home.   [place] - Preparing for Surgery  Before surgery, you can play an important role.  Because skin is not sterile, your skin needs to be as free of germs as possible.  You can reduce the number of germs on you skin by washing with CHG (chlorahexidine gluconate) soap before surgery.  CHG is an antiseptic cleaner which kills germs and bonds with the skin to continue killing germs even after washing.  Oral Hygiene is also important in reducing the risk of infection.  Remember to brush your teeth with your regular toothpaste the morning of surgery.  Please DO NOT use if  you have an allergy to CHG or antibacterial soaps.  If your skin becomes reddened/irritated stop using the CHG and inform your nurse when you arrive at Short Stay.  Do not shave (including legs and underarms) for at least 48 hours prior to the first CHG shower.  You may shave your face.  Please follow these instructions carefully:   1.  Shower with CHG Soap the night before surgery and the morning of Surgery.  2.  If you choose to wash your hair, wash your hair first as usual with your normal shampoo.  3.  After you shampoo, rinse your hair and body thoroughly to remove the shampoo. 4.  Use CHG as you would any other liquid soap.  You can apply chg directly to the skin and wash gently with a      scrungie or washcloth.           5.  Apply the CHG Soap to your body ONLY FROM THE NECK DOWN.   Do not use on open wounds or open sores. Avoid contact with your eyes, ears, mouth and genitals (private parts).  Wash genitals (private parts) with your normal soap - do this prior to using CHG soap.  6.  Wash thoroughly, paying special attention to the area where your surgery will be performed.  7.  Thoroughly rinse your body with warm water from the neck down.  8.  DO NOT shower/wash with your normal soap after using and rinsing off the  CHG Soap.  9.  Pat yourself dry with a clean towel.            10.  Wear clean pajamas.            11.  Place clean sheets on your bed the night of your first shower and do not sleep with pets.  Day of Surgery  Shower as above. Do not apply any lotions/deodorants the morning of surgery.   Please wear clean clothes to the hospital. Remember to brush your teeth with toothpaste.  Please read over the fact sheets that you were given.

## 2019-02-17 NOTE — H&P (Signed)
[name]; [id number]; 04-15-1960   HPI Patient is a 59 year old white male who was referred to my care by Dr. Delton Coombes for Port-A-Cath insertion.  He is undergoing chemotherapy for supraglottic carcinoma.  He denies any significant dysphagia.  He is having teeth pulled due to abscesses in the near future.  He has 0 out of 10 pain. Past Medical History:  Diagnosis Date  . Anxiety   . Arthritis    back  . Bradycardia    a. During 11/2012 adm: lopressor decreased.  . Carotid artery stenosis    65% RICA, mild-moderate L caroitd bulb 08/02/18 angiography, six month f/u rec  . 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 Coronary artery disease    a. Diag cath 10/2012 for CP/abnormal nuc -> planned PCI s/p LAD atherectomy/DES placement 11/05/12.  Marland Kitchen GERD (gastroesophageal reflux disease)   . Hypercholesteremia   . Hypertension   . Stroke ([place]) 07/31/2018   bilateral vertebrobasilar occlusion; "left side weaker thanit was before."    Past Surgical History:  Procedure Laterality Date  . CARDIAC CATHETERIZATION  10/29/2012  . CORONARY ANGIOPLASTY WITH STENT PLACEMENT  11/05/2012  . DIRECT LARYNGOSCOPY N/A 01/02/2019   Procedure: MICRO DIRECT LARYNGOSCOPY BIOPSY OF LARYNGEAL MASS;  Surgeon: [name], MD;  Location: [facility] OR;  Service: ENT;  Laterality: N/A;  . HEMORRHOID SURGERY  ~ 2010  . INSERTION OF MESH N/A 06/02/2015   Procedure: INSERTION OF MESH;  Surgeon: [name], MD;  Location: [facility];  Service: General;  Laterality: N/A;  . IR ANGIO INTRA EXTRACRAN SEL COM CAROTID INNOMINATE BILAT MOD SED  08/02/2018  . IR ANGIO VERTEBRAL SEL VERTEBRAL BILAT MOD SED  08/02/2018  . LEFT HEART CATHETERIZATION WITH CORONARY ANGIOGRAM N/A 10/30/2012   Procedure: LEFT HEART CATHETERIZATION WITH CORONARY ANGIOGRAM;  Surgeon: [name], MD;  Location: [facility] CATH LAB;  Service: Cardiovascular;  Laterality: N/A;  . PERCUTANEOUS  CORONARY ROTOBLATOR INTERVENTION (PCI-R) N/A 11/05/2012   Procedure: PERCUTANEOUS CORONARY ROTOBLATOR INTERVENTION (PCI-R);  Surgeon: [name], MD;  Location: [facility] CATH LAB;  Service: Cardiovascular;  Laterality: N/A;  . UMBILICAL HERNIA REPAIR N/A 06/02/2015   Procedure: UMBILICAL HERNIORRHAPHY WITH MESH;  Surgeon: [name], MD;  Location: [facility];  Service: General;  Laterality: N/A;    Family History  Problem Relation Age of Onset  . Hypertension Mother   . Heart disease Father   . Hypertension Father   . Heart disease Sister   . Hypertension Maternal Grandmother   . Hypertension Maternal Grandfather   . Hypertension Paternal Grandmother   . Hypertension Paternal Grandfather     Current Outpatient Medications on File Prior to Visit  Medication Sig Dispense Refill  . amLODipine (NORVASC) 5 MG tablet Take 1 tablet (5 mg total) by mouth daily. 30 tablet 0  . aspirin EC 81 MG tablet Take 81 mg by mouth daily.    Marland Kitchen atorvastatin (LIPITOR) 80 MG tablet Take 1 tablet (80 mg total) by mouth every evening. 90 tablet 3  . clopidogrel (PLAVIX) 75 MG tablet Take 1 tablet (75 mg total) by mouth daily. 90 tablet 3  . DULoxetine (CYMBALTA) 30 MG capsule Take 30 mg by mouth daily.    . famotidine (PEPCID) 20 MG tablet Take 20 mg by mouth at bedtime.    Marland Kitchen lisinopril (ZESTRIL) 20 MG tablet Take 1 tablet (20 mg total) by mouth daily. (  Patient taking differently: Take 20 mg by mouth 2 (two) times daily. ) 30 tablet 0  . metoprolol tartrate (LOPRESSOR) 25 MG tablet TAKE 1/2 TABLET BY MOUTH TWICE DAILY (Patient taking differently: Take 12.5 mg by mouth 2 (two) times daily. ) 30 tablet 3  . nitroGLYCERIN (NITROSTAT) 0.4 MG SL tablet Place 1 tablet (0.4 mg total) under the tongue every 5 (five) minutes as needed for chest pain (CP or SOB). 25 tablet 3  . omeprazole (PRILOSEC) 20 MG capsule Take 20 mg by mouth daily.     . traMADol (ULTRAM) 50 MG tablet     . HYDROcodone-acetaminophen (NORCO) 10-325  MG tablet Take 1 tablet by mouth every 4 (four) hours as needed. (Patient not taking: Reported on 02/06/2019) 50 tablet 0   No current facility-administered medications on file prior to visit.    Allergies  Allergen Reactions  . Prednisone Other (See Comments)    Chest pain    Social History   Substance and Sexual Activity  Alcohol Use Yes  . Alcohol/week: 18.0 standard drinks  . Types: 18 Cans of beer per week    Social History   Tobacco Use  Smoking Status Former Smoker  . Packs/day: 3.00  . Years: 32.00  . Pack years: 96.00  . Types: Cigarettes  . Start date: 08/29/1977  . Quit date: 11/29/2006  . Years since quitting: 12.2  Smokeless Tobacco Former Systems developer  . Types: Chew  Tobacco Comment   11/05/2012 "chewed tobacco when I play ball; aien't chewed since age 44"    Review of Systems  Constitutional: Negative.   HENT: Negative.   Eyes: Positive for blurred vision.  Respiratory: Negative.   Cardiovascular: Negative.   Gastrointestinal: Negative.   Genitourinary: Negative.   Musculoskeletal: Positive for back pain.  Skin: Negative.   Neurological: Negative.   Endo/Heme/Allergies: Negative.   Psychiatric/Behavioral: Negative.     Objective   Vitals:   02/06/19 1036  BP: 137/88  Pulse: (!) 54  Resp: 16  Temp: 98.5 F (36.9 C)  SpO2: 95%    Physical Exam Vitals reviewed.  Constitutional:      Appearance: Normal appearance. He is normal weight. He is not ill-appearing.  HENT:     Head: Normocephalic and atraumatic.  Cardiovascular:     Rate and Rhythm: Normal rate and regular rhythm.     Heart sounds: Normal heart sounds. No murmur. No friction rub. No gallop.   Pulmonary:     Effort: Pulmonary effort is normal. No respiratory distress.     Breath sounds: Normal breath sounds. No stridor. No wheezing, rhonchi or rales.  Skin:    General: Skin is warm and dry.  Neurological:     Mental Status: He is alert and oriented to person, place, and time.     Dr. Tomie China notes reviewed Assessment  Supraglottic carcinoma, need for central venous access Plan   Patient will be scheduled for Port-A-Cath insertion once his abscessed teeth are removed as I do want the port to become secondarily infected.  The risks and benefits of the procedure including bleeding, infection, and pneumothorax were fully explained to the patient, who gave informed consent.  He will contact us as soon as he has had his teeth pulled.  He will need to stop his Plavix 5 days before the procedure.

## 2019-02-17 NOTE — Progress Notes (Signed)
PCP - Dr. Gerarda Fraction Cardiologist - Dr. Jacinta Shoe   Chest x-ray - N/A EKG - 07/30/18 Stress Test - 09/13/12 ECHO - 08/01/18 Cardiac Cath - 2014  Sleep Study - no (+ Stopbang - sent to PCP) CPAP -   Blood Thinner Instructions: To hold Plavix prior to surgery (last dose was 02/13/19) per Dr. Enrique Sack. Pt is to continue to hold Plavix until after his Port-a-cath surgery on 02/21/19. Aspirin Instructions: To continue Aspirin per Dr. Enrique Sack  COVID TEST- to be done after this appointment  Anesthesia review: yes, heart hx and order from Dr. Enrique Sack  Patient denies shortness of breath, fever, cough and chest pain at PAT appointment   All instructions explained to the patient, with a verbal understanding of the material. Patient agrees to go over the instructions while at home for a better understanding. Patient also instructed to self quarantine after being tested for COVID-19. The opportunity to ask questions was provided.

## 2019-02-17 NOTE — Patient Instructions (Signed)
[name]  02/17/2019     @PREFPERIOPPHARMACY @   Your procedure is scheduled on  02/21/2019   Report to [facility] at  [place].M.  Call this number if you have problems the morning of surgery:  [phone]   Remember:  Do not eat or drink after midnight.                      Take these medicines the morning of surgery with A SIP OF WATER  Metoprolol, amlodipine. Follow any instructions given to you concerning your plavix that Dr Arnoldo Morale gave you.    Do not wear jewelry, make-up or nail polish.  Do not wear lotions, powders, or perfumes, or deodorant.  Do not shave 48 hours prior to surgery.  Please brush your teeth.  Do not bring valuables to the hospital.  [facility] is not responsible for any belongings or valuables.  Contacts, dentures or bridgework may not be worn into surgery.  Leave your suitcase in the car.  After surgery it may be brought to your room.  For patients admitted to the hospital, discharge time will be determined by your treatment team.  Patients discharged the day of surgery will not be allowed to drive home.   Name and phone number of your driver:   family Special instructions:  None  Please read over the following fact sheets that you were given. Anesthesia Post-op Instructions and Care and Recovery After Surgery       Implanted Port Insertion Implanted port insertion is a procedure to put in a port and catheter. The port is a device with an injectable disk that can be accessed by your health care provider. The port is connected to a vein in the chest or neck by a small flexible tube (catheter). There are different types of ports. The implanted port may be used as a long-term IV access for:  Medicines, such as chemotherapy.  Fluids.  Liquid nutrition, such as total parenteral nutrition (TPN). When you have a port, this means that your health care provider will not need to use the veins in your arms for these  procedures. Tell a health care provider about:  Any allergies you have.  All medicines you are taking, especially blood thinners, as well as any vitamins, herbs, eye drops, creams, over-the-counter medicines, and steroids.  Any problems you or family members have had with anesthetic medicines.  Any blood disorders you have.  Any surgeries you have had.  Any medical conditions you have or have had, including diabetes or kidney problems.  Whether you are pregnant or may be pregnant. What are the risks? Generally, this is a safe procedure. However, problems may occur, including:  Allergic reactions to medicines or dyes.  Damage to other structures or organs.  Infection.  Damage to the blood vessel, bruising, or bleeding at the puncture site.  Blood clot.  Breakdown of the skin over the port.  A collection of air in the chest that can cause one of the lungs to collapse (pneumothorax). This is rare. What happens before the procedure? Medicines  Ask your health care provider about: ? Changing or stopping your regular medicines. This is especially important if you are taking diabetes medicines or blood thinners. ? Taking medicines such as aspirin and ibuprofen. These medicines can thin your blood. Do not take these medicines unless your health care provider tells you to  take them. ? Taking over-the-counter medicines, vitamins, herbs, and supplements. Staying hydrated Follow instructions from your health care provider about hydration, which may include:  Up to 2 hours before the procedure - you may continue to drink clear liquids, such as water, clear fruit juice, black coffee, and plain tea.  Eating and drinking restrictions  Follow instructions from your health care provider about eating and drinking, which may include: ? 8 hours before the procedure - stop eating heavy meals or foods, such as meat, fried foods, or fatty foods. ? 6 hours before the procedure - stop eating  light meals or foods, such as toast or cereal. ? 6 hours before the procedure - stop drinking milk or drinks that contain milk. ? 2 hours before the procedure - stop drinking clear liquids. General instructions  Plan to have someone take you home from the hospital or clinic.  If you will be going home right after the procedure, plan to have someone with you for 24 hours.  You may have blood tests.  Do not use any products that contain nicotine or tobacco for at least 4-6 weeks before the procedure. These products include cigarettes, e-cigarettes, and chewing tobacco. If you need help quitting, ask your health care provider.  Ask your health care provider what steps will be taken to help prevent infection. These may include: ? Removing hair at the surgery site. ? Washing skin with a germ-killing soap. ? Taking antibiotic medicine. What happens during the procedure?   An IV will be inserted into one of your veins.  You will be given one or more of the following: ? A medicine to help you relax (sedative). ? A medicine to numb the area (local anesthetic).  Two small incisions will be made to insert the port. ? One smaller incision will be made in your neck to get access to the vein where the catheter will lie. ? The other incision will be made in the upper chest. This is where the port will lie.  The procedure may be done using continuous X-ray (fluoroscopy) or other imaging tools for guidance.  The port and catheter will be placed. There may be a small, raised area where the port is.  The port will be flushed with a salt solution (saline), and blood will be drawn to make sure that it is working correctly.  The incisions will be closed.  Bandages (dressings) may be placed over the incisions. The procedure may vary among health care providers and hospitals. What happens after the procedure?  Your blood pressure, heart rate, breathing rate, and blood oxygen level will be monitored  until you leave the hospital or clinic.  Do not drive for 24 hours if you were given a sedative during your procedure.  You will be given a manufacturer's information card for the type of port that you have. Keep this with you.  Your port will need to be flushed and checked as told by your health care provider, usually every few weeks.  A chest X-ray will be done to: ? Check the placement of the port. ? Make sure there is no injury to your lung. Summary  Implanted port insertion is a procedure to put in a port and catheter.  The implanted port is used as a long-term IV access.  The port will need to be flushed and checked as told by your health care provider, usually every few weeks.  Keep your manufacturer's information card with you at all times. This  information is not intended to replace advice given to you by your health care provider. Make sure you discuss any questions you have with your health care provider. Document Revised: 04/12/2018 Document Reviewed: 07/17/2017 Elsevier Patient Education  [place] Insertion, Care After This sheet gives you information about how to care for yourself after your procedure. Your health care provider may also give you more specific instructions. If you have problems or questions, contact your health care provider. What can I expect after the procedure? After the procedure, it is common to have:  Discomfort at the port insertion site.  Bruising on the skin over the port. This should improve over 3-4 days. Follow these instructions at home: [facility] care  After your port is placed, you will get a manufacturer's information card. The card has information about your port. Keep this card with you at all times.  Take care of the port as told by your health care provider. Ask your health care provider if you or a family member can get training for taking care of the port at home. A home health care nurse may also take care  of the port.  Make sure to remember what type of port you have. Incision care      Follow instructions from your health care provider about how to take care of your port insertion site. Make sure you: ? Wash your hands with soap and water before and after you change your bandage (dressing). If soap and water are not available, use hand sanitizer. ? Change your dressing as told by your health care provider. ? Leave stitches (sutures), skin glue, or adhesive strips in place. These skin closures may need to stay in place for 2 weeks or longer. If adhesive strip edges start to loosen and curl up, you may trim the loose edges. Do not remove adhesive strips completely unless your health care provider tells you to do that.  Check your port insertion site every day for signs of infection. Check for: ? Redness, swelling, or pain. ? Fluid or blood. ? Warmth. ? Pus or a bad smell. Activity  Return to your normal activities as told by your health care provider. Ask your health care provider what activities are safe for you.  Do not lift anything that is heavier than 10 lb (4.5 kg), or the limit that you are told, until your health care provider says that it is safe. General instructions  Take over-the-counter and prescription medicines only as told by your health care provider.  Do not take baths, swim, or use a hot tub until your health care provider approves. Ask your health care provider if you may take showers. You may only be allowed to take sponge baths.  Do not drive for 24 hours if you were given a sedative during your procedure.  Wear a medical alert bracelet in case of an emergency. This will tell any health care providers that you have a port.  Keep all follow-up visits as told by your health care provider. This is important. Contact a health care provider if:  You cannot flush your port with saline as directed, or you cannot draw blood from the port.  You have a fever or  chills.  You have redness, swelling, or pain around your port insertion site.  You have fluid or blood coming from your port insertion site.  Your port insertion site feels warm to the touch.  You have pus or a bad smell  coming from the port insertion site. Get help right away if:  You have chest pain or shortness of breath.  You have bleeding from your port that you cannot control. Summary  Take care of the port as told by your health care provider. Keep the manufacturer's information card with you at all times.  Change your dressing as told by your health care provider.  Contact a health care provider if you have a fever or chills or if you have redness, swelling, or pain around your port insertion site.  Keep all follow-up visits as told by your health care provider. This information is not intended to replace advice given to you by your health care provider. Make sure you discuss any questions you have with your health care provider. Document Revised: 07/17/2017 Document Reviewed: 07/17/2017 Elsevier Patient Education  [place] An implanted port is a device that is placed under the skin. It is usually placed in the chest. The device can be used to give IV medicine, to take blood, or for dialysis. You may have an implanted port if:  You need IV medicine that would be irritating to the small veins in your hands or arms.  You need IV medicines, such as antibiotics, for a long period of time.  You need IV nutrition for a long period of time.  You need dialysis. Having a port means that your health care provider will not need to use the veins in your arms for these procedures. You may have fewer limitations when using a port than you would if you used other types of long-term IVs, and you will likely be able to return to normal activities after your incision heals. An implanted port has two main parts:  Reservoir. The reservoir is the part  where a needle is inserted to give medicines or draw blood. The reservoir is round. After it is placed, it appears as a small, raised area under your skin.  Catheter. The catheter is a thin, flexible tube that connects the reservoir to a vein. Medicine that is inserted into the reservoir goes into the catheter and then into the vein. How is my port accessed? To access your port:  A numbing cream may be placed on the skin over the port site.  Your health care provider will put on a mask and sterile gloves.  The skin over your port will be cleaned carefully with a germ-killing soap and allowed to dry.  Your health care provider will gently pinch the port and insert a needle into it.  Your health care provider will check for a blood return to make sure the port is in the vein and is not clogged.  If your port needs to remain accessed to get medicine continuously (constant infusion), your health care provider will place a clear bandage (dressing) over the needle site. The dressing and needle will need to be changed every week, or as told by your health care provider. What is flushing? Flushing helps keep the port from getting clogged. Follow instructions from your health care provider about how and when to flush the port. Ports are usually flushed with saline solution or a medicine called heparin. The need for flushing will depend on how the port is used:  If the port is only used from time to time to give medicines or draw blood, the port may need to be flushed: ? Before and after medicines have been given. ? Before and after blood  has been drawn. ? As part of routine maintenance. Flushing may be recommended every 4-6 weeks.  If a constant infusion is running, the port may not need to be flushed.  Throw away any syringes in a disposal container that is meant for sharp items (sharps container). You can buy a sharps container from a pharmacy, or you can make one by using an empty hard plastic  bottle with a cover. How long will my port stay implanted? The port can stay in for as long as your health care provider thinks it is needed. When it is time for the port to come out, a surgery will be done to remove it. The surgery will be similar to the procedure that was done to put the port in. Follow these instructions at home:   Flush your port as told by your health care provider.  If you need an infusion over several days, follow instructions from your health care provider about how to take care of your port site. Make sure you: ? Wash your hands with soap and water before you change your dressing. If soap and water are not available, use alcohol-based hand sanitizer. ? Change your dressing as told by your health care provider. ? Place any used dressings or infusion bags into a plastic bag. Throw that bag in the trash. ? Keep the dressing that covers the needle clean and dry. Do not get it wet. ? Do not use scissors or sharp objects near the tube. ? Keep the tube clamped, unless it is being used.  Check your port site every day for signs of infection. Check for: ? Redness, swelling, or pain. ? Fluid or blood. ? Pus or a bad smell.  Protect the skin around the port site. ? Avoid wearing bra straps that rub or irritate the site. ? Protect the skin around your port from seat belts. Place a soft pad over your chest if needed.  Bathe or shower as told by your health care provider. The site may get wet as long as you are not actively receiving an infusion.  Return to your normal activities as told by your health care provider. Ask your health care provider what activities are safe for you.  Carry a medical alert card or wear a medical alert bracelet at all times. This will let health care providers know that you have an implanted port in case of an emergency. Get help right away if:  You have redness, swelling, or pain at the port site.  You have fluid or blood coming from your  port site.  You have pus or a bad smell coming from the port site.  You have a fever. Summary  Implanted ports are usually placed in the chest for long-term IV access.  Follow instructions from your health care provider about flushing the port and changing bandages (dressings).  Take care of the area around your port by avoiding clothing that puts pressure on the area, and by watching for signs of infection.  Protect the skin around your port from seat belts. Place a soft pad over your chest if needed.  Get help right away if you have a fever or you have redness, swelling, pain, drainage, or a bad smell at the port site. This information is not intended to replace advice given to you by your health care provider. Make sure you discuss any questions you have with your health care provider. Document Revised: 04/12/2018 Document Reviewed: 01/22/2016 Elsevier Patient Education  [phone]  [place] After These instructions provide you with information about caring for yourself after your procedure. Your health care provider may also give you more specific instructions. Your treatment has been planned according to current medical practices, but problems sometimes occur. Call your health care provider if you have any problems or questions after your procedure. What can I expect after the procedure? After your procedure, you may:  Feel sleepy for several hours.  Feel clumsy and have poor balance for several hours.  Feel forgetful about what happened after the procedure.  Have poor judgment for several hours.  Feel nauseous or vomit.  Have a sore throat if you had a breathing tube during the procedure. Follow these instructions at home: For at least 24 hours after the procedure:      Have a responsible adult stay with you. It is important to have someone help care for you until you are awake and alert.  Rest as needed.  Do not: ? Participate in  activities in which you could fall or become injured. ? Drive. ? Use heavy machinery. ? Drink alcohol. ? Take sleeping pills or medicines that cause drowsiness. ? Make important decisions or sign legal documents. ? Take care of children on your own. Eating and drinking  Follow the diet that is recommended by your health care provider.  If you vomit, drink water, juice, or soup when you can drink without vomiting.  Make sure you have little or no nausea before eating solid foods. General instructions  Take over-the-counter and prescription medicines only as told by your health care provider.  If you have sleep apnea, surgery and certain medicines can increase your risk for breathing problems. Follow instructions from your health care provider about wearing your sleep device: ? Anytime you are sleeping, including during daytime naps. ? While taking prescription pain medicines, sleeping medicines, or medicines that make you drowsy.  If you smoke, do not smoke without supervision.  Keep all follow-up visits as told by your health care provider. This is important. Contact a health care provider if:  You keep feeling nauseous or you keep vomiting.  You feel light-headed.  You develop a rash.  You have a fever. Get help right away if:  You have trouble breathing. Summary  For several hours after your procedure, you may feel sleepy and have poor judgment.  Have a responsible adult stay with you for at least 24 hours or until you are awake and alert. This information is not intended to replace advice given to you by your health care provider. Make sure you discuss any questions you have with your health care provider. Document Revised: 03/19/2017 Document Reviewed: 04/11/2015 Elsevier Patient Education  [place]. How to Use Chlorhexidine for Bathing Chlorhexidine gluconate (CHG) is a germ-killing (antiseptic) solution that is used to clean the skin. It can get rid of  the bacteria that normally live on the skin and can keep them away for about 24 hours. To clean your skin with CHG, you may be given:  A CHG solution to use in the shower or as part of a sponge bath.  A prepackaged cloth that contains CHG. Cleaning your skin with CHG may help lower the risk for infection:  While you are staying in the intensive care unit of the hospital.  If you have a vascular access, such as a central line, to provide short-term or long-term access to your veins.  If you have a catheter  to drain urine from your bladder.  If you are on a ventilator. A ventilator is a machine that helps you breathe by moving air in and out of your lungs.  After surgery. What are the risks? Risks of using CHG include:  A skin reaction.  Hearing loss, if CHG gets in your ears.  Eye injury, if CHG gets in your eyes and is not rinsed out.  The CHG product catching fire. Make sure that you avoid smoking and flames after applying CHG to your skin. Do not use CHG:  If you have a chlorhexidine allergy or have previously reacted to chlorhexidine.  On babies younger than 66 months of age. How to use CHG solution  Use CHG only as told by your health care provider, and follow the instructions on the label.  Use the full amount of CHG as directed. Usually, this is one bottle. During a shower Follow these steps when using CHG solution during a shower (unless your health care provider gives you different instructions): 1. Start the shower. 2. Use your normal soap and shampoo to wash your face and hair. 3. Turn off the shower or move out of the shower stream. 4. Pour the CHG onto a clean washcloth. Do not use any type of brush or rough-edged sponge. 5. Starting at your neck, lather your body down to your toes. Make sure you follow these instructions: ? If you will be having surgery, pay special attention to the part of your body where you will be having surgery. Scrub this area for at  least 1 minute. ? Do not use CHG on your head or face. If the solution gets into your ears or eyes, rinse them well with water. ? Avoid your genital area. ? Avoid any areas of skin that have broken skin, cuts, or scrapes. ? Scrub your back and under your arms. Make sure to wash skin folds. 6. Let the lather sit on your skin for 1-2 minutes or as long as told by your health care provider. 7. Thoroughly rinse your entire body in the shower. Make sure that all body creases and crevices are rinsed well. 8. Dry off with a clean towel. Do not put any substances on your body afterward--such as powder, lotion, or perfume--unless you are told to do so by your health care provider. Only use lotions that are recommended by the manufacturer. 9. Put on clean clothes or pajamas. 10. If it is the night before your surgery, sleep in clean sheets.  During a sponge bath Follow these steps when using CHG solution during a sponge bath (unless your health care provider gives you different instructions): 1. Use your normal soap and shampoo to wash your face and hair. 2. Pour the CHG onto a clean washcloth. 3. Starting at your neck, lather your body down to your toes. Make sure you follow these instructions: ? If you will be having surgery, pay special attention to the part of your body where you will be having surgery. Scrub this area for at least 1 minute. ? Do not use CHG on your head or face. If the solution gets into your ears or eyes, rinse them well with water. ? Avoid your genital area. ? Avoid any areas of skin that have broken skin, cuts, or scrapes. ? Scrub your back and under your arms. Make sure to wash skin folds. 4. Let the lather sit on your skin for 1-2 minutes or as long as told by your health care provider.  5. Using a different clean, wet washcloth, thoroughly rinse your entire body. Make sure that all body creases and crevices are rinsed well. 6. Dry off with a clean towel. Do not put any  substances on your body afterward--such as powder, lotion, or perfume--unless you are told to do so by your health care provider. Only use lotions that are recommended by the manufacturer. 7. Put on clean clothes or pajamas. 8. If it is the night before your surgery, sleep in clean sheets. How to use CHG prepackaged cloths  Only use CHG cloths as told by your health care provider, and follow the instructions on the label.  Use the CHG cloth on clean, dry skin.  Do not use the CHG cloth on your head or face unless your health care provider tells you to.  When washing with the CHG cloth: ? Avoid your genital area. ? Avoid any areas of skin that have broken skin, cuts, or scrapes. Before surgery Follow these steps when using a CHG cloth to clean before surgery (unless your health care provider gives you different instructions): 1. Using the CHG cloth, vigorously scrub the part of your body where you will be having surgery. Scrub using a back-and-forth motion for 3 minutes. The area on your body should be completely wet with CHG when you are done scrubbing. 2. Do not rinse. Discard the cloth and let the area air-dry. Do not put any substances on the area afterward, such as powder, lotion, or perfume. 3. Put on clean clothes or pajamas. 4. If it is the night before your surgery, sleep in clean sheets.  For general bathing Follow these steps when using CHG cloths for general bathing (unless your health care provider gives you different instructions). 1. Use a separate CHG cloth for each area of your body. Make sure you wash between any folds of skin and between your fingers and toes. Wash your body in the following order, switching to a new cloth after each step: ? The front of your neck, shoulders, and chest. ? Both of your arms, under your arms, and your hands. ? Your stomach and groin area, avoiding the genitals. ? Your right leg and foot. ? Your left leg and foot. ? The back of your neck,  your back, and your buttocks. 2. Do not rinse. Discard the cloth and let the area air-dry. Do not put any substances on your body afterward--such as powder, lotion, or perfume--unless you are told to do so by your health care provider. Only use lotions that are recommended by the manufacturer. 3. Put on clean clothes or pajamas. Contact a health care provider if:  Your skin gets irritated after scrubbing.  You have questions about using your solution or cloth. Get help right away if:  Your eyes become very red or swollen.  Your eyes itch badly.  Your skin itches badly and is red or swollen.  Your hearing changes.  You have trouble seeing.  You have swelling or tingling in your mouth or throat.  You have trouble breathing.  You swallow any chlorhexidine. Summary  Chlorhexidine gluconate (CHG) is a germ-killing (antiseptic) solution that is used to clean the skin. Cleaning your skin with CHG may help to lower your risk for infection.  You may be given CHG to use for bathing. It may be in a bottle or in a prepackaged cloth to use on your skin. Carefully follow your health care provider's instructions and the instructions on the product label.  Do  not use CHG if you have a chlorhexidine allergy.  Contact your health care provider if your skin gets irritated after scrubbing. This information is not intended to replace advice given to you by your health care provider. Make sure you discuss any questions you have with your health care provider. Document Revised: 03/07/2018 Document Reviewed: 11/16/2016 Elsevier Patient Education  [employer].

## 2019-02-17 NOTE — Progress Notes (Signed)
[place], [place] [id number]   CLINIC:  Medical Oncology/Hematology  PCP:  [name], [place] [address] [phone]   REASON FOR VISIT:  Follow-up for laryngeal cancer.  CURRENT THERAPY: Chemoradiation therapy.  BRIEF ONCOLOGIC HISTORY:  Oncology History  Malignant neoplasm of supraglottis ([facility])  01/22/2019 Initial Diagnosis   Malignant neoplasm of supraglottis ([facility])   01/30/2019 Cancer Staging   Staging form: Larynx - Supraglottis, AJCC 8th Edition - Clinical: Stage IVA (cT2, cN2c, cM0) - Signed by [name], MD on 01/30/2019   03/07/2019 -  Chemotherapy   The patient had palonosetron (ALOXI) injection 0.25 mg, 0.25 mg, Intravenous,  Once, 1 of 7 cycles Administration: 0.25 mg (03/07/2019) CISplatin (PLATINOL) 79 mg in sodium chloride 0.9 % 250 mL chemo infusion, 40 mg/m2 = 79 mg, Intravenous,  Once, 1 of 7 cycles Administration: 79 mg (03/07/2019) fosaprepitant (EMEND) 150 mg in sodium chloride 0.9 % 145 mL IVPB, 150 mg, Intravenous,  Once, 1 of 7 cycles Administration: 150 mg (03/07/2019)  for chemotherapy treatment.       CANCER STAGING: Cancer Staging Malignant neoplasm of supraglottis ([facility]) Staging form: Larynx - Supraglottis, AJCC 8th Edition - Clinical: Stage IVA (cT2, cN2c, cM0) - Signed by [name], MD on 01/30/2019    INTERVAL HISTORY:  [name] 59 y.o. 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  He denies any dysphagia.  No odynophagia.  Denies any weight loss in the last 2 to 3 months.  He was evaluated by Dr. Dorothyann Gibbs and a tooth extraction is planned for tomorrow.  He also met with Dr. Arnoldo Morale and a port is being planned for Friday.  He has seen Dr. Isidore Moos with regards to radiation therapy.  Appetite and energy levels are 75%.  No pain reported.  Occasional nausea but denied any vomiting.  Denies any clinical hearing loss.  Denies any  tingling or numbness in extremities.  No prior history of kidney disease.    REVIEW OF SYSTEMS:  Review of Systems  Gastrointestinal: Positive for nausea.  All other systems reviewed and are negative.    PAST MEDICAL/SURGICAL HISTORY:  Past Medical History:  Diagnosis Date  . Anxiety   . Arthritis    back  . Bradycardia    a. During 11/2012 adm: lopressor decreased.  . Cancer of larynx ([facility])   . Carotid artery stenosis   . 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 Coronary artery disease    a. Diag cath 10/2012 for CP/abnormal nuc -> planned PCI s/p LAD atherectomy/DES placement 11/05/12.  Marland Kitchen GERD (gastroesophageal reflux disease)   . History of hiatal hernia   . Hypercholesteremia   . Hypertension   . Stroke ([facility]) 07/31/2018   bilateral vertebrobasilar occlusion; "left side weaker thanit was before."   Past Surgical History:  Procedure Laterality Date  . CARDIAC CATHETERIZATION  10/29/2012  . CORONARY ANGIOPLASTY WITH STENT PLACEMENT  11/05/2012  . DIRECT LARYNGOSCOPY N/A 01/02/2019   Procedure: MICRO DIRECT LARYNGOSCOPY BIOPSY OF LARYNGEAL MASS;  Surgeon: [name], MD;  Location: [facility];  Service: ENT;  Laterality: N/A;  . HEMORRHOID SURGERY  ~ 2010  . INSERTION OF MESH N/A 06/02/2015   Procedure: INSERTION OF MESH;  Surgeon: [name], MD;  Location: [facility];  Service: General;  Laterality: N/A;  . IR ANGIO  INTRA EXTRACRAN SEL COM CAROTID INNOMINATE BILAT MOD SED  08/02/2018  . IR ANGIO VERTEBRAL SEL VERTEBRAL BILAT MOD SED  08/02/2018  . LAPAROSCOPIC INSERTION GASTROSTOMY TUBE N/A 02/24/2019   Procedure: LAPAROSCOPIC INSERTION GASTROSTOMY TUBE;  Surgeon: [name], MD;  Location: [facility];  Service: General;  Laterality: N/A;  . LEFT HEART CATHETERIZATION WITH CORONARY ANGIOGRAM N/A 10/30/2012   Procedure: LEFT HEART CATHETERIZATION WITH CORONARY ANGIOGRAM;  Surgeon: [name], MD;   Location: [facility];  Service: Cardiovascular;  Laterality: N/A;  . MULTIPLE EXTRACTIONS WITH ALVEOLOPLASTY N/A 02/18/2019   Procedure: Extraction of tooth #'s 5-7, 10,11,14,20-29, 31 and 32 with alveoloplasty and maxiillary left buccal exostoses reductions;  Surgeon: [name], [name];  Location: [facility];  Service: Oral Surgery;  Laterality: N/A;  . PERCUTANEOUS CORONARY ROTOBLATOR INTERVENTION (PCI-R) N/A 11/05/2012   Procedure: PERCUTANEOUS CORONARY ROTOBLATOR INTERVENTION (PCI-R);  Surgeon: [name], MD;  Location: [facility];  Service: Cardiovascular;  Laterality: N/A;  . TRACHEOSTOMY TUBE PLACEMENT N/A 02/18/2019   Procedure: Tracheostomy;  Surgeon: [name], MD;  Location: [facility];  Service: ENT;  Laterality: N/A;  . UMBILICAL HERNIA REPAIR N/A 06/02/2015   Procedure: UMBILICAL HERNIORRHAPHY WITH MESH;  Surgeon: [name], MD;  Location: [facility];  Service: General;  Laterality: N/A;     SOCIAL HISTORY:  Social History   Socioeconomic History  . Marital status: Married    Spouse name: Not on file  . Number of children: 4  . Years of education: Not on file  . Highest education level: Not on file  Occupational History  . Occupation: Disabled  Tobacco Use  . Smoking status: Former Smoker    Packs/day: 3.00    Years: 32.00    Pack years: 96.00    Types: Cigarettes    Start date: 08/29/1977    Quit date: 11/29/2006    Years since quitting: 12.2  . Smokeless tobacco: Former Systems developer    Types: Chew  . Tobacco comment: 11/05/2012 "chewed tobacco when I play ball; aien't chewed since age 81"  Substance and Sexual Activity  . Alcohol use: Yes    Alcohol/week: 12.0 standard drinks    Types: 12 Cans of beer per week    Comment: Currently 3-4 beers per day. 02/13/19, was a heavy drinker in the past, - states he could drink a case a day   . Drug use: Yes    Types: Marijuana  . Sexual activity: Not Currently  Other Topics Concern  . Not on file  Social History Narrative    Patient is disabled.  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   Patient quit smoking 10 to 12 years ago.  Patient previously 3 pack/day use.   Patient currently drinking 3-4 beers per day from 12 pack a day.   Patient denies use of chew or other illicit drugs.   Social Determinants of Health   Financial Resource Strain: Low Risk   . Difficulty of Paying Living Expenses: Not hard at all  Food Insecurity: No Food Insecurity  . Worried About Charity fundraiser in the Last Year: Never true  . Ran Out of Food in the Last Year: Never true  Transportation Needs: No Transportation Needs  . Lack of Transportation (Medical): No  . Lack of Transportation (Non-Medical): No  Physical Activity: Insufficiently Active  . Days of Exercise per Week: 2 days  . Minutes of Exercise per Session: 30 min  Stress: Stress Concern  Present  . Feeling of Stress : To some extent  Social Connections: Moderately Isolated  . Frequency of Communication with Friends and Family: Once a week  . Frequency of Social Gatherings with Friends and Family: Once a week  . Attends Religious Services: Never  . Active Member of Clubs or Organizations: No  . Attends Archivist Meetings: Never  . Marital Status: Married  Human resources officer Violence: Not At Risk  . Fear of Current or Ex-Partner: No  . Emotionally Abused: No  . Physically Abused: No  . Sexually Abused: No    FAMILY HISTORY:  Family History  Problem Relation Age of Onset  . Hypertension Mother   . Heart disease Father   . Hypertension Father   . Heart disease Sister   . Hypertension Maternal Grandmother   . Hypertension Maternal Grandfather   . Hypertension Paternal Grandmother   . Hypertension Paternal Grandfather     CURRENT MEDICATIONS:  Outpatient Encounter Medications as of 02/17/2019  Medication Sig Note  . amLODipine (NORVASC) 5 MG tablet Take 1 tablet (5 mg total) by mouth daily.   Marland Kitchen aspirin EC 81 MG tablet  Take 81 mg by mouth daily.   Marland Kitchen atorvastatin (LIPITOR) 80 MG tablet Take 1 tablet (80 mg total) by mouth every evening.   . metoprolol tartrate (LOPRESSOR) 25 MG tablet TAKE 1/2 TABLET BY MOUTH TWICE DAILY (Patient taking differently: Take 12.5 mg by mouth 2 (two) times daily. )   . omeprazole (PRILOSEC) 20 MG capsule Take 20 mg by mouth daily.    . [DISCONTINUED] lisinopril (ZESTRIL) 20 MG tablet Take 1 tablet (20 mg total) by mouth daily. (Patient taking differently: Take 20 mg by mouth 2 (two) times daily. )   . [DISCONTINUED] penicillin v potassium (VEETID) 500 MG tablet Take 1,000 mg by mouth daily.  02/13/2019: 19 day therapy course patient began on 02/04/2019  . clopidogrel (PLAVIX) 75 MG tablet Take 1 tablet (75 mg total) by mouth daily. (Patient not taking: Reported on 02/17/2019) 03/06/2019: Pt will start back taking in a couple weeks per pt  . HYDROcodone-acetaminophen (NORCO) 10-325 MG tablet Take 1 tablet by mouth every 4 (four) hours as needed. (Patient not taking: Reported on 03/06/2019)   . nitroGLYCERIN (NITROSTAT) 0.4 MG SL tablet Place 1 tablet (0.4 mg total) under the tongue every 5 (five) minutes as needed for chest pain (CP or SOB). (Patient not taking: Reported on 02/17/2019)   . traMADol (ULTRAM) 50 MG tablet Take 50-100 mg by mouth 4 (four) times daily as needed (pain.).     No facility-administered encounter medications on file as of 02/17/2019.    ALLERGIES:  Allergies  Allergen Reactions  . Prednisone Other (See Comments)    Chest pain     PHYSICAL EXAM:  ECOG Performance status: 0  Vitals:   02/17/19 1117  BP: (!) 160/91  Pulse: (!) 56  Resp: 18  Temp: (!) 97.1 F (36.2 C)  SpO2: 99%   Filed Weights   02/17/19 1117  Weight: 181 lb 11.2 oz (82.4 kg)    Physical Exam Vitals reviewed.  Constitutional:      Appearance: Normal appearance.  Cardiovascular:     Rate and Rhythm: Normal rate and regular rhythm.     Heart sounds: Normal heart sounds.   Pulmonary:     Effort: Pulmonary effort is normal.     Breath sounds: Normal breath sounds.  Abdominal:     General: There is no distension.  Palpations: Abdomen is soft. There is no mass.  Musculoskeletal:        General: No swelling.  Skin:    General: Skin is warm.  Neurological:     General: No focal deficit present.     Mental Status: He is alert and oriented to person, place, and time.  Psychiatric:        Mood and Affect: Mood normal.        Behavior: Behavior normal.      LABORATORY DATA:  I have reviewed the labs as listed.  CBC    Component Value Date/Time   WBC 10.5 03/06/2019 0909   RBC 3.06 (L) 03/06/2019 0909   HGB 9.6 (L) 03/06/2019 0909   HCT 30.2 (L) 03/06/2019 0909   PLT 465 (H) 03/06/2019 0909   MCV 98.7 03/06/2019 0909   MCH 31.4 03/06/2019 0909   MCHC 31.8 03/06/2019 0909   RDW 13.0 03/06/2019 0909   LYMPHSABS 2.0 03/06/2019 0909   MONOABS 1.2 (H) 03/06/2019 0909   EOSABS 0.2 03/06/2019 0909   BASOSABS 0.1 03/06/2019 0909   CMP Latest Ref Rng & Units 03/07/2019 03/06/2019 03/04/2019  Glucose 70 - 99 mg/dL 133(H) 106(H) 131(H)  BUN 6 - 20 mg/dL '9 18 11  '$ Creatinine 0.61 - 1.24 mg/dL 1.05 1.71(H) 0.93  Sodium 135 - 145 mmol/L 137 132(L) 136  Potassium 3.5 - 5.1 mmol/L 3.5 3.4(L) 3.7  Chloride 98 - 111 mmol/L 104 100 102  CO2 22 - 32 mmol/L '23 23 25  '$ Calcium 8.9 - 10.3 mg/dL 8.8(L) 8.5(L) 9.1  Total Protein 6.5 - 8.1 g/dL - 6.8 7.4  Total Bilirubin 0.3 - 1.2 mg/dL - 0.4 0.5  Alkaline Phos 38 - 126 U/L - 52 55  AST 15 - 41 U/L - 18 19  ALT 0 - 44 U/L - 21 23       DIAGNOSTIC IMAGING:  I have independently reviewed the scans and discussed with the patient.    ASSESSMENT & PLAN:   Malignant neoplasm of supraglottis ([facility]) 1.  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  True vocal cords were edematous but without mass or lesion.   Base of the tongue was normal.  Larynx was mobile without lesions. -[facility] laryngoscopy on 01/02/2019 showed soft tissue mass over the left posterior larynx, overlying left arytenoid cartilage.  Biopsy consistent with invasive squamous cell carcinoma. -PET scan on 01/27/2019 showed hypermetabolic lesion identified in the left and posterior laryngeal region with SUV 21.9.  Hypermetabolic lymphadenopathy in the neck bilaterally at level 2 and 3 positions. -He was evaluated by Dr. Isidore Moos.  He was also evaluated by Dr. Dorothyann Gibbs and will have teeth extraction tomorrow. -He will have a port placed on Friday.  He quit smoking 10 years ago.  He smoked 2 to 3 packs/day for 40 years.  He drinks 6 pack of beer every day. -We talked about chemoradiation therapy with cisplatin.  Audiogram on 02/03/2019 showed mild left high-frequency conductive hearing loss.  She does not have any neuropathy. -We talked about standard treatment with high-dose cisplatin on day 1, 21 and 42 along with radiation.  We talked about side effects in detail.  If he does not tolerate it, we will switch him to weekly regimen. -Tentatively he will start treatment in 2 weeks.  2.  CVA: -He had pontine stroke and large vessel occlusion. -He is currently on Plavix and aspirin 81 mg daily.  Orders placed this encounter:  Orders Placed This Encounter  Procedures  . CBC with Differential/Platelet  . Comprehensive metabolic panel      [name], MD [place] [phone]

## 2019-02-17 NOTE — Assessment & Plan Note (Signed)
1.  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  True vocal cords were edematous but without mass or lesion.  Base of the tongue was normal.  Larynx was mobile without lesions. -[facility] laryngoscopy on 01/02/2019 showed soft tissue mass over the left posterior larynx, overlying left arytenoid cartilage.  Biopsy consistent with invasive squamous cell carcinoma. -PET scan on 01/27/2019 showed hypermetabolic lesion identified in the left and posterior laryngeal region with SUV 21.9.  Hypermetabolic lymphadenopathy in the neck bilaterally at level 2 and 3 positions. -He was evaluated by Dr. Isidore Moos.  He was also evaluated by Dr. Dorothyann Gibbs and will have teeth extraction tomorrow. -He will have a port placed on Friday.  He quit smoking 10 years ago.  He smoked 2 to 3 packs/day for 40 years.  He drinks 6 pack of beer every day. -We talked about chemoradiation therapy with cisplatin.  Audiogram on 02/03/2019 showed mild left high-frequency conductive hearing loss.  She does not have any neuropathy. -We talked about standard treatment with high-dose cisplatin on day 1, 21 and 42 along with radiation.  We talked about side effects in detail.  If he does not tolerate it, we will switch him to weekly regimen. -Tentatively he will start treatment in 2 weeks.  2.  CVA: -He had pontine stroke and large vessel occlusion. -He is currently on Plavix and aspirin 81 mg daily.

## 2019-02-17 NOTE — Progress Notes (Signed)
Anesthesia Chart Review:  Case: [id number] Date/Time: 02/18/19 0715   Procedure: MULTIPLE EXTRACTION WITH ALVEOLOPLASTY AND PREPROSTHETIC SURGERY AS NEEDED (N/A )   Anesthesia type: General   Pre-op diagnosis: CANCER OF LARYNX, CHRONIC PERIODONTITIS   Location: [address] OR ROOM 04 / [facility]   Surgeons: [name], [name]      DISCUSSION: Patient is a 59 year old male scheduled for the above procedure. He is s/p microlaryngoscopy, neck mass biopsy on 01/02/19.  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 Dental extractions/alveoloplasty recommended prior to chemoradiation. He is also scheduled to get a Port-a-cath inserted on [facility] by Rodney Langton, MD on 02/21/19.    History includes former smoker (quit 2008), laryngeal cancer (diagnosed 01/02/19), CAD (s/p DES LAD 11/05/12), bradycardia (2014, improved with decreased dose b-blocker), HTN, hypercholesterolemia, GERD, chronic low back pain, CVA (07/31/18, presented with blurred vision, ataxia, leg weakness, MRI R > L pontine infarct, MRA bilateral vertebral artery occlusion), carotid artery stenosis (Carotid US 02/12/19: 40-59% RICA stenosis, 6-96% LICA stenosis), umbilical hernia repair 7/89/38. OSA screening score of 5.   Last evaluated at [facility] on 02/11/19 by Bernerd Pho, PA-C. Continued medical therapy for CAD with one year follow-up. Repeat carotid US (ordered per IR Luanne Bras, MD) on 02/12/19 showed 40-59% RICA stenosis and 1-01% LICA stenosis.  Per Dr. Ritta Slot office note, he had coordinated timing of dental and Port-a-cath procedures with Dr. Arnoldo Morale. Plavix was held following 02/13/19 dental visit. Reportedly, he is to continue ASA.   02/17/19 presurgical COVID-19 test is in process. Anesthesia team to evaluate on the day of surgery.    VS: BP (!) 142/86   Pulse 71   Temp 36.9 C (Oral)   Resp 17   Ht 5\' 8"  (1.727 m)   Wt 81.8 kg   SpO2 99%   BMI 27.41  kg/m     PROVIDERS: [name], MD is PCP - Kate Sable, MD is cardiologist. Last visit 02/11/19 with Bernerd Pho, Georgana Curio, MD is neurologist. Last visit 12/24/18 with Frann Rider, NP. Overall stroke symptoms improved, but still with some residual intermittent blurred vision and occasional imbalance. Recommended vascular surgery follow-up for ICA stenosis (carotid US done 02/12/19).  [name], MD is HEM-ONC Eppie Gibson, MD is RAD-ONC - [name], MD is ENT   LABS: Labs reviewed: Acceptable for surgery. (all labs ordered are listed, but only abnormal results are displayed)  Labs Reviewed  CBC - Abnormal; Notable for the following components:      Result Value   RBC 3.72 (*)    Hemoglobin 11.9 (*)    HCT 35.2 (*)    All other components within normal limits  COMPREHENSIVE METABOLIC PANEL - Abnormal; Notable for the following components:   Sodium 132 (*)    Glucose, Bld 101 (*)    All other components within normal limits     IMAGES: PET Scan 01/27/19: IMPRESSION: 1. Hypermetabolic laryngeal lesion consistent with the patient's known primary malignancy. 2. Hypermetabolic cervical metastatic disease bilaterally. 3. No evidence for hypermetabolic metastases in the chest, abdomen, or pelvis.  CT soft tissue neck 12/26/18: IMPRESSION: 1. Asymmetric soft tissue mass lesion within the left aryepiglottic fold extending to the supraglottic larynx measures at least 1.7 x 1.2 x 0.7 cm. 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 2. Enlarged hyperdense left level 2 lymph nodes are concerning for malignancy. 3. Bilateral hypodense enlarged level 4 lymph nodes are less  likely to be related to the squamous cell carcinoma. PET scan would be useful for further evaluation of these nodes and the presumed primary lesion. These nodes raise concern for a second malignancy. 4. High-grade stenosis of the proximal left common  carotid artery. 5. High-grade stenosis of the proximal right internal carotid artery. 6. Aortic Atherosclerosis (ICD10-I70.0) and Emphysema (ICD10-J43.9). (Carotid US 02/12/19: 40-59% RICA stenosis, 9-98% LICA stenosis)   EKG: EKG not ordered at his 02/11/19 [facility] visit. - EKG 07/31/18: Confirmed by ED provider as junction rhythm, non-specific T wave abnormalities in anterolateral leads, ST elevation, consider inferior injury.  - However, I believe baseline rhythm is Sinus Bradycardia as P waves are present with PR interval 140 ms. (P wave are particularly noted in V1-4, I, but are more flat in aVR, II and avF). In regards to inferior ST elevation, consider injury--high sensitivity troponin I normal x2. Although inferior ST elevation less prominent on 08/30/17 and 7/31/8 tracings, it is similar to 05/28/15 preoperative EKG (read as early repolarization), 12/08/13 EKG, and 11/15/12 EKG (read as "Sinus  Bradycardia -Inferolateral ST-elevation -possible repolarization variant -consider injury. PROBABLY NORMAL"). 08/01/18 echo showed normal LVEF.   CV: Carotid US 02/12/19: Summary:  - Right Carotid: Velocities in the right ICA are consistent with a 40-59% stenosis.  - Left Carotid: Velocities in the left ICA are consistent with a 1-39% stenosis.  - Vertebrals: Bilateral vertebral arteries demonstrate antegrade flow.  - Subclavians: Normal flow hemodynamics were seen in bilateral subclavian arteries.  (Comparison imaging: - CT soft tissue neck with contrast 12/26/18: High grade stenosis of the proximal left common carotid artery and proximal right internal carotid artery - Bilateral common carotid and innominate angiography 7/31/210: Occluded bilateral vertebrobasilar junctions just distal to the posteroinferior cerebellar arteries. Approximately 65% stenosis of the right internal carotid artery just distal to the bulb. Moderate arteriosclerotic narrowing of the internal carotid arteries in the  petrous cervical and petrous cavernous regions bilaterally, retrograde opacification of the basilar artery terminus via the right posterior communicating artery via the right internal carotid artery)    Echo 08/01/18: IMPRESSIONS 1. The left ventricle has normal systolic function, with an ejection fraction of 55-60%. The cavity size was normal. There is mildly increased left ventricular wall thickness. Left ventricular diastolic Doppler parameters are consistent with impaired  relaxation. 2. The right ventricle has normal systolic function. The cavity was normal. There is no increase in right ventricular wall thickness. Right ventricular systolic pressure could not be assessed. 3. Aortic valve regurgitation is trivial by color flow Doppler. No stenosis of the aortic valve. 4. No intracardiac thrombi or masses were visualized. 5. The interatrial septum was not well visualized. 6. The inferior vena cava was normal in size with <50% respiratory variability.   Cardiac cath 10/30/12:  Coronary angiography: Coronary dominance:right  Left Main: Mildly calcified with no significant disease.  Left Anterior Descending (LAD): Severely calcified proximally with 80-90% heavily calcified eccentric lesion and proximally at the origin of the large diagonal branch. There is a 40% mid stenosis. There is of the vessel has minor irregularities.  1st diagonal (D1): Large in size with 40% ostial stenosis.  2nd diagonal (D2): Very small in size.  3rd diagonal (D3): Very small in size.  Circumflex (LCx): Normal in size and moderately calcified proximally. There is diffuse 40% disease in the midsegment.  1st obtuse marginal: Normal in size with no significant disease.  2nd obtuse marginal: Normal in size with no significant disease.  3rd obtuse marginal:  Small in size with minor irregularities.  Right Coronary Artery: Medium in size and significantly calcified proximally. There is a 60%  stenosis proximally with a small aneurysmal segment. There is a 40% disease in the mid RCA.  Posterior descending artery: This is mostly supplied by a large RV branch.  Posterior AV segment: Small in size with minor irregularities.  Posterolateral branchs: Small in size branches. Left ventriculography: Left ventricular systolic function is low normal , LVEF is estimated at 50 %, there is nosignificant mitral regurgitation . Mild anterior wall hypokinesis. Final Conclusions:  1.Severe heavily calcified proximal LAD stenosis at the origin of a large diagonal branch with moderate disease in the right coronary artery. 2. Low normal LV systolic function and mildly elevated left ventricular end-diastolic pressure.  Recommendations: PCI with atherectomy of the LAD versus CABG. This will be discussed with the patient and family. PCI 11/05/12: Rotational atherectomy of the proximal LAD , PTCA and stenting of the proximal LAD (Stent 3.0 x 28 mm Promus drug-eluting stent). Post stent 0% stenosis, TIMI-flow 3.   Past Medical History:  Diagnosis Date  . Anxiety   . Arthritis    back  . Bradycardia    a. During 11/2012 adm: lopressor decreased.  . Cancer of larynx ([place])   . Carotid artery stenosis   . 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 Coronary artery disease    a. Diag cath 10/2012 for CP/abnormal nuc -> planned PCI s/p LAD atherectomy/DES placement 11/05/12.  Marland Kitchen GERD (gastroesophageal reflux disease)   . History of hiatal hernia   . Hypercholesteremia   . Hypertension   . Stroke ([place]) 07/31/2018   bilateral vertebrobasilar occlusion; "left side weaker thanit was before."    Past Surgical History:  Procedure Laterality Date  . CARDIAC CATHETERIZATION  10/29/2012  . CORONARY ANGIOPLASTY WITH STENT PLACEMENT  11/05/2012  . DIRECT LARYNGOSCOPY N/A 01/02/2019   Procedure: MICRO DIRECT LARYNGOSCOPY BIOPSY OF  LARYNGEAL MASS;  Surgeon: [name], MD;  Location: [facility] OR;  Service: ENT;  Laterality: N/A;  . HEMORRHOID SURGERY  ~ 2010  . INSERTION OF MESH N/A 06/02/2015   Procedure: INSERTION OF MESH;  Surgeon: [name], MD;  Location: [facility];  Service: General;  Laterality: N/A;  . IR ANGIO INTRA EXTRACRAN SEL COM CAROTID INNOMINATE BILAT MOD SED  08/02/2018  . IR ANGIO VERTEBRAL SEL VERTEBRAL BILAT MOD SED  08/02/2018  . LEFT HEART CATHETERIZATION WITH CORONARY ANGIOGRAM N/A 10/30/2012   Procedure: LEFT HEART CATHETERIZATION WITH CORONARY ANGIOGRAM;  Surgeon: [name], MD;  Location: [facility] CATH LAB;  Service: Cardiovascular;  Laterality: N/A;  . PERCUTANEOUS CORONARY ROTOBLATOR INTERVENTION (PCI-R) N/A 11/05/2012   Procedure: PERCUTANEOUS CORONARY ROTOBLATOR INTERVENTION (PCI-R);  Surgeon: [name], MD;  Location: [facility] C[facility] LAB;  Service: Cardiovascular;  Laterality: N/A;  . UMBILICAL HERNIA REPAIR N/A 06/02/2015   Procedure: UMBILICAL HERNIORRHAPHY WITH MESH;  Surgeon: [name], MD;  Location: [facility];  Service: General;  Laterality: N/A;    MEDICATIONS: . amLODipine (NORVASC) 5 MG tablet  . aspirin EC 81 MG tablet  . atorvastatin (LIPITOR) 80 MG tablet  . clopidogrel (PLAVIX) 75 MG tablet  . HYDROcodone-acetaminophen (NORCO) 10-325 MG tablet  . lisinopril (ZESTRIL) 20 MG tablet  . metoprolol tartrate (LOPRESSOR) 25 MG tablet  . nitroGLYCERIN (NITROSTAT) 0.4 MG SL tablet  . omeprazole (PRILOSEC) 20 MG capsule  . penicillin  v potassium (VEETID) 500 MG tablet  . traMADol (ULTRAM) 50 MG tablet   No current facility-administered medications for this encounter.  Plavix currently on hold. He is not taking Norco, Nitro. 19 day course of Veetid started 02/04/19 (had seen dentist Dr. Deloria Lair in [place], [place] with plans for extraction of remaining teeth at that time, but could not follow-up there due to economic concerns and was referred to Dr. Enrique Sack).   Myra Gianotti, PA-C Surgical Short  Stay/Anesthesiology [facility] Phone [phone] [facility] Phone [phone] 02/17/2019 3:18 PM

## 2019-02-18 ENCOUNTER — Ambulatory Visit (HOSPITAL_COMMUNITY): Payer: 59 | Admitting: Physician Assistant

## 2019-02-18 ENCOUNTER — Encounter (HOSPITAL_COMMUNITY): Payer: Self-pay | Admitting: General Practice

## 2019-02-18 ENCOUNTER — Ambulatory Visit (HOSPITAL_COMMUNITY): Payer: 59 | Admitting: Anesthesiology

## 2019-02-18 ENCOUNTER — Inpatient Hospital Stay (HOSPITAL_COMMUNITY): Payer: 59 | Admitting: General Practice

## 2019-02-18 ENCOUNTER — Inpatient Hospital Stay (HOSPITAL_COMMUNITY)
Admission: AD | Admit: 2019-02-18 | Discharge: 2019-02-26 | DRG: 012 | Disposition: A | Discharge status: Home Health | Payer: 59 | Attending: Internal Medicine | Admitting: Internal Medicine

## 2019-02-18 ENCOUNTER — Encounter (HOSPITAL_COMMUNITY): Payer: Self-pay | Admitting: Dentistry

## 2019-02-18 ENCOUNTER — Other Ambulatory Visit: Payer: Self-pay

## 2019-02-18 ENCOUNTER — Encounter (HOSPITAL_COMMUNITY): Payer: Self-pay

## 2019-02-18 ENCOUNTER — Telehealth (HOSPITAL_COMMUNITY): Payer: Self-pay

## 2019-02-18 ENCOUNTER — Encounter (HOSPITAL_COMMUNITY)
Admission: AD | Disposition: A | Discharge status: Home / Self Care | Payer: Self-pay | Source: Home / Self Care | Attending: Internal Medicine

## 2019-02-18 ENCOUNTER — Inpatient Hospital Stay (HOSPITAL_COMMUNITY): Payer: 59

## 2019-02-18 DIAGNOSIS — C760 Malignant neoplasm of head, face and neck: Secondary | ICD-10-CM | POA: Diagnosis not present

## 2019-02-18 DIAGNOSIS — Z7982 Long term (current) use of aspirin: Secondary | ICD-10-CM

## 2019-02-18 DIAGNOSIS — F329 Major depressive disorder, single episode, unspecified: Secondary | ICD-10-CM | POA: Diagnosis present

## 2019-02-18 DIAGNOSIS — K047 Periapical abscess without sinus: Secondary | ICD-10-CM | POA: Diagnosis present

## 2019-02-18 DIAGNOSIS — C329 Malignant neoplasm of larynx, unspecified: Secondary | ICD-10-CM

## 2019-02-18 DIAGNOSIS — Z5111 Encounter for antineoplastic chemotherapy: Secondary | ICD-10-CM | POA: Diagnosis not present

## 2019-02-18 DIAGNOSIS — G8929 Other chronic pain: Secondary | ICD-10-CM | POA: Diagnosis present

## 2019-02-18 DIAGNOSIS — K219 Gastro-esophageal reflux disease without esophagitis: Secondary | ICD-10-CM | POA: Diagnosis present

## 2019-02-18 DIAGNOSIS — K045 Chronic apical periodontitis: Secondary | ICD-10-CM | POA: Diagnosis not present

## 2019-02-18 DIAGNOSIS — F17210 Nicotine dependence, cigarettes, uncomplicated: Secondary | ICD-10-CM | POA: Diagnosis present

## 2019-02-18 DIAGNOSIS — M545 Low back pain: Secondary | ICD-10-CM | POA: Diagnosis not present

## 2019-02-18 DIAGNOSIS — K029 Dental caries, unspecified: Secondary | ICD-10-CM | POA: Diagnosis present

## 2019-02-18 DIAGNOSIS — C321 Malignant neoplasm of supraglottis: Principal | ICD-10-CM | POA: Diagnosis present

## 2019-02-18 DIAGNOSIS — F419 Anxiety disorder, unspecified: Secondary | ICD-10-CM | POA: Diagnosis present

## 2019-02-18 DIAGNOSIS — I700 Atherosclerosis of aorta: Secondary | ICD-10-CM | POA: Diagnosis present

## 2019-02-18 DIAGNOSIS — E7800 Pure hypercholesterolemia, unspecified: Secondary | ICD-10-CM | POA: Diagnosis present

## 2019-02-18 DIAGNOSIS — I69354 Hemiplegia and hemiparesis following cerebral infarction affecting left non-dominant side: Secondary | ICD-10-CM

## 2019-02-18 DIAGNOSIS — R52 Pain, unspecified: Secondary | ICD-10-CM

## 2019-02-18 DIAGNOSIS — E871 Hypo-osmolality and hyponatremia: Secondary | ICD-10-CM | POA: Diagnosis not present

## 2019-02-18 DIAGNOSIS — I110 Hypertensive heart disease with heart failure: Secondary | ICD-10-CM | POA: Diagnosis present

## 2019-02-18 DIAGNOSIS — K0530 Chronic periodontitis, unspecified: Secondary | ICD-10-CM | POA: Diagnosis present

## 2019-02-18 DIAGNOSIS — K0889 Other specified disorders of teeth and supporting structures: Secondary | ICD-10-CM | POA: Diagnosis not present

## 2019-02-18 DIAGNOSIS — Z20822 Contact with and (suspected) exposure to covid-19: Secondary | ICD-10-CM

## 2019-02-18 DIAGNOSIS — Z955 Presence of coronary angioplasty implant and graft: Secondary | ICD-10-CM

## 2019-02-18 DIAGNOSIS — M79675 Pain in left toe(s): Secondary | ICD-10-CM | POA: Diagnosis present

## 2019-02-18 DIAGNOSIS — Z538 Procedure and treatment not carried out for other reasons: Secondary | ICD-10-CM | POA: Diagnosis present

## 2019-02-18 DIAGNOSIS — Z8249 Family history of ischemic heart disease and other diseases of the circulatory system: Secondary | ICD-10-CM | POA: Diagnosis not present

## 2019-02-18 DIAGNOSIS — T884XXA Failed or difficult intubation, initial encounter: Secondary | ICD-10-CM | POA: Diagnosis present

## 2019-02-18 DIAGNOSIS — I5030 Unspecified diastolic (congestive) heart failure: Secondary | ICD-10-CM | POA: Diagnosis present

## 2019-02-18 DIAGNOSIS — K083 Retained dental root: Secondary | ICD-10-CM | POA: Diagnosis present

## 2019-02-18 DIAGNOSIS — I509 Heart failure, unspecified: Secondary | ICD-10-CM | POA: Diagnosis present

## 2019-02-18 DIAGNOSIS — I10 Essential (primary) hypertension: Secondary | ICD-10-CM | POA: Diagnosis not present

## 2019-02-18 DIAGNOSIS — M5126 Other intervertebral disc displacement, lumbar region: Secondary | ICD-10-CM | POA: Diagnosis present

## 2019-02-18 DIAGNOSIS — Z888 Allergy status to other drugs, medicaments and biological substances status: Secondary | ICD-10-CM

## 2019-02-18 DIAGNOSIS — Z7902 Long term (current) use of antithrombotics/antiplatelets: Secondary | ICD-10-CM

## 2019-02-18 DIAGNOSIS — R633 Feeding difficulties, unspecified: Secondary | ICD-10-CM

## 2019-02-18 DIAGNOSIS — Z79899 Other long term (current) drug therapy: Secondary | ICD-10-CM

## 2019-02-18 DIAGNOSIS — Z930 Tracheostomy status: Secondary | ICD-10-CM

## 2019-02-18 DIAGNOSIS — Z9889 Other specified postprocedural states: Secondary | ICD-10-CM | POA: Diagnosis not present

## 2019-02-18 DIAGNOSIS — I2510 Atherosclerotic heart disease of native coronary artery without angina pectoris: Secondary | ICD-10-CM | POA: Diagnosis present

## 2019-02-18 DIAGNOSIS — M278 Other specified diseases of jaws: Secondary | ICD-10-CM | POA: Diagnosis not present

## 2019-02-18 HISTORY — PX: MULTIPLE EXTRACTIONS WITH ALVEOLOPLASTY: SHX5342

## 2019-02-18 HISTORY — PX: TRACHEOSTOMY TUBE PLACEMENT: SHX814

## 2019-02-18 LAB — MRSA PCR SCREENING: MRSA by PCR: NEGATIVE

## 2019-02-18 SURGERY — MULTIPLE EXTRACTION WITH ALVEOLOPLASTY
Anesthesia: General | Site: Neck

## 2019-02-18 MED ORDER — CEFAZOLIN SODIUM-DEXTROSE 2-4 GM/100ML-% IV SOLN
2.0000 g | Freq: Once | INTRAVENOUS | Status: AC
  Administered 2019-02-18: 2 g via INTRAVENOUS

## 2019-02-18 MED ORDER — LABETALOL HCL 5 MG/ML IV SOLN: 10.0000 mg | INTRAVENOUS | Status: DC | PRN

## 2019-02-18 MED ORDER — CHLORHEXIDINE GLUCONATE CLOTH 2 % EX PADS
6.0000 | MEDICATED_PAD | Freq: Every day | CUTANEOUS | Status: DC
  Administered 2019-02-20 – 2019-02-26 (×6): 6 via TOPICAL

## 2019-02-18 MED ORDER — ONDANSETRON HCL 4 MG/2ML IJ SOLN
INTRAMUSCULAR | Status: DC | PRN
  Administered 2019-02-18: 4 mg via INTRAVENOUS

## 2019-02-18 MED ORDER — ACETAMINOPHEN 500 MG PO TABS: 1000.0000 mg | ORAL_TABLET | Freq: Once | ORAL | Status: AC

## 2019-02-18 MED ORDER — HYDROMORPHONE HCL 1 MG/ML IJ SOLN: 0.5000 mg | Freq: Once | INTRAMUSCULAR | Status: DC

## 2019-02-18 MED ORDER — ROCURONIUM BROMIDE 10 MG/ML (PF) SYRINGE
PREFILLED_SYRINGE | INTRAVENOUS | Status: AC
  Filled 2019-02-18: qty 10

## 2019-02-18 MED ORDER — THROMBIN 5000 UNITS EX SOLR
CUTANEOUS | Status: AC
  Filled 2019-02-18: qty 5000

## 2019-02-18 MED ORDER — LACTATED RINGERS IV SOLN: INTRAVENOUS | Status: DC | PRN

## 2019-02-18 MED ORDER — HYDROMORPHONE HCL 1 MG/ML IJ SOLN
INTRAMUSCULAR | Status: AC
  Filled 2019-02-18: qty 1

## 2019-02-18 MED ORDER — EPHEDRINE 5 MG/ML INJ
INTRAVENOUS | Status: AC
  Filled 2019-02-18: qty 10

## 2019-02-18 MED ORDER — HYDROMORPHONE HCL 1 MG/ML IJ SOLN
0.2500 mg | INTRAMUSCULAR | Status: DC | PRN
  Administered 2019-02-18 (×3): 0.5 mg via INTRAVENOUS

## 2019-02-18 MED ORDER — PHENYLEPHRINE HCL-NACL 10-0.9 MG/250ML-% IV SOLN
INTRAVENOUS | Status: DC | PRN
  Administered 2019-02-18: 50 ug/min via INTRAVENOUS

## 2019-02-18 MED ORDER — PROPOFOL 10 MG/ML IV BOLUS
INTRAVENOUS | Status: AC
  Filled 2019-02-18: qty 20

## 2019-02-18 MED ORDER — MEPERIDINE HCL 25 MG/ML IJ SOLN: 6.2500 mg | INTRAMUSCULAR | Status: DC | PRN

## 2019-02-18 MED ORDER — OXYCODONE HCL 5 MG/5ML PO SOLN: 5.0000 mg | Freq: Once | ORAL | Status: DC | PRN

## 2019-02-18 MED ORDER — SUGAMMADEX SODIUM 200 MG/2ML IV SOLN
INTRAVENOUS | Status: DC | PRN
  Administered 2019-02-18: 200 mg via INTRAVENOUS

## 2019-02-18 MED ORDER — AMINOCAPROIC ACID SOLUTION 5% (50 MG/ML)
ORAL | Status: DC | PRN
  Administered 2019-02-18: 5 mL via ORAL

## 2019-02-18 MED ORDER — METOPROLOL TARTRATE 5 MG/5ML IV SOLN
2.5000 mg | INTRAVENOUS | Status: DC | PRN
  Administered 2019-02-18 – 2019-02-19 (×2): 5 mg via INTRAVENOUS
  Filled 2019-02-18: qty 5

## 2019-02-18 MED ORDER — AMINOCAPROIC ACID SOLUTION 5% (50 MG/ML)
10.0000 mL | ORAL | Status: DC
  Filled 2019-02-18: qty 100

## 2019-02-18 MED ORDER — PROPOFOL 10 MG/ML IV BOLUS
INTRAVENOUS | Status: DC | PRN
  Administered 2019-02-18 (×2): 30 mg via INTRAVENOUS
  Administered 2019-02-18: 20 mg via INTRAVENOUS
  Administered 2019-02-18: 50 mg via INTRAVENOUS
  Administered 2019-02-18: 20 mg via INTRAVENOUS
  Administered 2019-02-18: 150 mg via INTRAVENOUS

## 2019-02-18 MED ORDER — ROCURONIUM BROMIDE 10 MG/ML (PF) SYRINGE
PREFILLED_SYRINGE | INTRAVENOUS | Status: DC | PRN
  Administered 2019-02-18: 20 mg via INTRAVENOUS
  Administered 2019-02-18: 60 mg via INTRAVENOUS
  Administered 2019-02-18 (×2): 20 mg via INTRAVENOUS

## 2019-02-18 MED ORDER — CEFAZOLIN SODIUM-DEXTROSE 2-4 GM/100ML-% IV SOLN
INTRAVENOUS | Status: AC
  Filled 2019-02-18: qty 100

## 2019-02-18 MED ORDER — PROMETHAZINE HCL 25 MG/ML IJ SOLN: 6.2500 mg | INTRAMUSCULAR | Status: DC | PRN

## 2019-02-18 MED ORDER — DEXMEDETOMIDINE HCL IN NACL 200 MCG/50ML IV SOLN
INTRAVENOUS | Status: AC
  Filled 2019-02-18: qty 50

## 2019-02-18 MED ORDER — LACTATED RINGERS IV SOLN: INTRAVENOUS | Status: DC

## 2019-02-18 MED ORDER — DEXAMETHASONE SODIUM PHOSPHATE 10 MG/ML IJ SOLN
INTRAMUSCULAR | Status: AC
  Filled 2019-02-18: qty 1

## 2019-02-18 MED ORDER — MORPHINE SULFATE (PF) 2 MG/ML IV SOLN
2.0000 mg | INTRAVENOUS | Status: DC | PRN
  Filled 2019-02-18: qty 1

## 2019-02-18 MED ORDER — LIDOCAINE 2% (20 MG/ML) 5 ML SYRINGE
INTRAMUSCULAR | Status: AC
  Filled 2019-02-18: qty 5

## 2019-02-18 MED ORDER — THROMBIN 5000 UNITS EX SOLR
CUTANEOUS | Status: DC | PRN
  Administered 2019-02-18: 5000 [IU] via TOPICAL

## 2019-02-18 MED ORDER — FENTANYL CITRATE (PF) 100 MCG/2ML IJ SOLN
INTRAMUSCULAR | Status: DC | PRN
  Administered 2019-02-18 (×5): 50 ug via INTRAVENOUS

## 2019-02-18 MED ORDER — FENTANYL CITRATE (PF) 250 MCG/5ML IJ SOLN
INTRAMUSCULAR | Status: AC
  Filled 2019-02-18: qty 5

## 2019-02-18 MED ORDER — PANTOPRAZOLE SODIUM 40 MG IV SOLR
40.0000 mg | Freq: Every day | INTRAVENOUS | Status: DC
  Administered 2019-02-18 – 2019-02-22 (×5): 40 mg via INTRAVENOUS
  Filled 2019-02-18 (×5): qty 40

## 2019-02-18 MED ORDER — ACETAMINOPHEN 325 MG PO TABS: 650.0000 mg | ORAL_TABLET | Freq: Four times a day (QID) | ORAL | Status: DC | PRN

## 2019-02-18 MED ORDER — OXYMETAZOLINE HCL 0.05 % NA SOLN
NASAL | Status: DC | PRN
  Administered 2019-02-18: 2 via NASAL

## 2019-02-18 MED ORDER — LIDOCAINE-EPINEPHRINE 2 %-1:100000 IJ SOLN
INTRAMUSCULAR | Status: DC | PRN
  Administered 2019-02-18 (×6): 1.7 mL via INTRADERMAL

## 2019-02-18 MED ORDER — MIDAZOLAM HCL 2 MG/2ML IJ SOLN
INTRAMUSCULAR | Status: AC
  Filled 2019-02-18: qty 2

## 2019-02-18 MED ORDER — OXYCODONE HCL 5 MG PO TABS: 5.0000 mg | ORAL_TABLET | Freq: Once | ORAL | Status: DC | PRN

## 2019-02-18 MED ORDER — 0.9 % SODIUM CHLORIDE (POUR BTL) OPTIME
TOPICAL | Status: DC | PRN
  Administered 2019-02-18: 1000 mL

## 2019-02-18 MED ORDER — LIDOCAINE 2% (20 MG/ML) 5 ML SYRINGE
INTRAMUSCULAR | Status: DC | PRN
  Administered 2019-02-18: 60 mg via INTRAVENOUS

## 2019-02-18 MED ORDER — METOPROLOL TARTRATE 5 MG/5ML IV SOLN
INTRAVENOUS | Status: AC
  Filled 2019-02-18: qty 5

## 2019-02-18 MED ORDER — DEXMEDETOMIDINE HCL IN NACL 200 MCG/50ML IV SOLN
INTRAVENOUS | Status: DC | PRN
  Administered 2019-02-18: 12 ug via INTRAVENOUS

## 2019-02-18 MED ORDER — MORPHINE SULFATE (PF) 2 MG/ML IV SOLN
2.0000 mg | INTRAVENOUS | Status: DC | PRN
  Administered 2019-02-18 (×4): 2 mg via INTRAVENOUS
  Administered 2019-02-19 (×2): 4 mg via INTRAVENOUS
  Administered 2019-02-19: 2 mg via INTRAVENOUS
  Administered 2019-02-19: 4 mg via INTRAVENOUS
  Administered 2019-02-19 (×2): 2 mg via INTRAVENOUS
  Administered 2019-02-19 – 2019-02-20 (×6): 4 mg via INTRAVENOUS
  Administered 2019-02-20 – 2019-02-21 (×3): 2 mg via INTRAVENOUS
  Administered 2019-02-21: 4 mg via INTRAVENOUS
  Administered 2019-02-22 – 2019-02-23 (×4): 2 mg via INTRAVENOUS
  Administered 2019-02-23 (×2): 4 mg via INTRAVENOUS
  Administered 2019-02-23 – 2019-02-24 (×2): 2 mg via INTRAVENOUS
  Administered 2019-02-24 (×2): 4 mg via INTRAVENOUS
  Administered 2019-02-25: 2 mg via INTRAVENOUS
  Administered 2019-02-25 (×4): 4 mg via INTRAVENOUS
  Filled 2019-02-18 (×3): qty 2
  Filled 2019-02-18: qty 1
  Filled 2019-02-18 (×2): qty 2
  Filled 2019-02-18 (×5): qty 1
  Filled 2019-02-18: qty 2
  Filled 2019-02-18: qty 1
  Filled 2019-02-18 (×2): qty 2
  Filled 2019-02-18 (×3): qty 1
  Filled 2019-02-18 (×2): qty 2
  Filled 2019-02-18: qty 1
  Filled 2019-02-18: qty 2
  Filled 2019-02-18: qty 1
  Filled 2019-02-18 (×3): qty 2
  Filled 2019-02-18 (×2): qty 1
  Filled 2019-02-18 (×3): qty 2
  Filled 2019-02-18: qty 1
  Filled 2019-02-18: qty 2
  Filled 2019-02-18: qty 1
  Filled 2019-02-18: qty 2

## 2019-02-18 MED ORDER — ACETAMINOPHEN 650 MG RE SUPP: 650.0000 mg | Freq: Four times a day (QID) | RECTAL | Status: DC | PRN

## 2019-02-18 MED ORDER — BUPIVACAINE-EPINEPHRINE (PF) 0.5% -1:200000 IJ SOLN
INTRAMUSCULAR | Status: AC
  Filled 2019-02-18: qty 3.6

## 2019-02-18 MED ORDER — OXYMETAZOLINE HCL 0.05 % NA SOLN
NASAL | Status: AC
  Filled 2019-02-18: qty 30

## 2019-02-18 MED ORDER — ACETAMINOPHEN 500 MG PO TABS
ORAL_TABLET | ORAL | Status: AC
  Administered 2019-02-18: 1000 mg via ORAL
  Filled 2019-02-18: qty 2

## 2019-02-18 MED ORDER — EPHEDRINE SULFATE 50 MG/ML IJ SOLN
INTRAMUSCULAR | Status: DC | PRN
  Administered 2019-02-18: 5 mg via INTRAVENOUS

## 2019-02-18 MED ORDER — LIDOCAINE-EPINEPHRINE 2 %-1:100000 IJ SOLN
INTRAMUSCULAR | Status: AC
  Filled 2019-02-18: qty 10.2

## 2019-02-18 MED ORDER — LIDOCAINE-EPINEPHRINE 1 %-1:100000 IJ SOLN
INTRAMUSCULAR | Status: AC
  Filled 2019-02-18: qty 1

## 2019-02-18 MED ORDER — HYDRALAZINE HCL 20 MG/ML IJ SOLN
10.0000 mg | INTRAMUSCULAR | Status: DC | PRN
  Administered 2019-02-18: 20 mg via INTRAVENOUS
  Administered 2019-02-19 – 2019-02-21 (×3): 10 mg via INTRAVENOUS
  Filled 2019-02-18 (×4): qty 1

## 2019-02-18 MED ORDER — HYDROCODONE-ACETAMINOPHEN 5-325 MG PO TABS: 1.0000 | ORAL_TABLET | ORAL | Status: DC | PRN

## 2019-02-18 MED ORDER — MIDAZOLAM HCL 5 MG/5ML IJ SOLN
INTRAMUSCULAR | Status: DC | PRN
  Administered 2019-02-18: 2 mg via INTRAVENOUS

## 2019-02-18 MED ORDER — DEXAMETHASONE SODIUM PHOSPHATE 10 MG/ML IJ SOLN
INTRAMUSCULAR | Status: DC | PRN
  Administered 2019-02-18: 10 mg via INTRAVENOUS

## 2019-02-18 MED ORDER — ONDANSETRON HCL 4 MG/2ML IJ SOLN
INTRAMUSCULAR | Status: AC
  Filled 2019-02-18: qty 2

## 2019-02-18 MED ORDER — BUPIVACAINE-EPINEPHRINE 0.5% -1:200000 IJ SOLN
INTRAMUSCULAR | Status: DC | PRN
  Administered 2019-02-18 (×2): 1.8 mL

## 2019-02-18 MED ORDER — LIDOCAINE-EPINEPHRINE 1 %-1:100000 IJ SOLN
INTRAMUSCULAR | Status: DC | PRN
  Administered 2019-02-18: 3 mL

## 2019-02-18 SURGICAL SUPPLY — 44 items
ALCOHOL 70% 16 OZ (MISCELLANEOUS) ×3 IMPLANT
ATTRACTOMAT 16X20 MAGNETIC DRP (DRAPES) ×3 IMPLANT
BLADE SURG 15 STRL LF DISP TIS (BLADE) ×4 IMPLANT
BLADE SURG 15 STRL SS (BLADE) ×4
COVER SURGICAL LIGHT HANDLE (MISCELLANEOUS) ×6 IMPLANT
COVER WAND RF STERILE (DRAPES) ×3 IMPLANT
GAUZE 4X4 16PLY RFD (DISPOSABLE) ×3 IMPLANT
GAUZE PACKING FOLDED 2  STR (GAUZE/BANDAGES/DRESSINGS) ×1
GAUZE PACKING FOLDED 2 STR (GAUZE/BANDAGES/DRESSINGS) ×2 IMPLANT
GLOVE BIO SURGEON STRL SZ 6.5 (GLOVE) ×3 IMPLANT
GLOVE SURG ORTHO 8.0 STRL STRW (GLOVE) ×3 IMPLANT
GOWN STRL REUS W/ TWL LRG LVL3 (GOWN DISPOSABLE) ×2 IMPLANT
GOWN STRL REUS W/TWL 2XL LVL3 (GOWN DISPOSABLE) ×3 IMPLANT
GOWN STRL REUS W/TWL LRG LVL3 (GOWN DISPOSABLE) ×1
HEMOSTAT SNOW SURGICEL 2X4 (HEMOSTASIS) ×3 IMPLANT
HEMOSTAT SURGICEL 2X14 (HEMOSTASIS) IMPLANT
KIT BASIN OR (CUSTOM PROCEDURE TRAY) ×3 IMPLANT
KIT TURNOVER KIT B (KITS) ×3 IMPLANT
MANIFOLD NEPTUNE II (INSTRUMENTS) ×6 IMPLANT
NEEDLE BLUNT 16X1.5 OR ONLY (NEEDLE) IMPLANT
NEEDLE DENTAL 27 LONG (NEEDLE) IMPLANT
NS IRRIG 1000ML POUR BTL (IV SOLUTION) ×3 IMPLANT
PACK EENT II TURBAN DRAPE (CUSTOM PROCEDURE TRAY) ×3 IMPLANT
PAD ARMBOARD 7.5X6 YLW CONV (MISCELLANEOUS) ×3 IMPLANT
SPONGE INTESTINAL PEANUT (DISPOSABLE) ×3 IMPLANT
SPONGE SURGIFOAM ABS GEL 100 (HEMOSTASIS) ×3 IMPLANT
SPONGE SURGIFOAM ABS GEL 12-7 (HEMOSTASIS) IMPLANT
SPONGE SURGIFOAM ABS GEL SZ50 (HEMOSTASIS) IMPLANT
SUCTION FRAZIER HANDLE 10FR (MISCELLANEOUS) ×1
SUCTION TUBE FRAZIER 10FR DISP (MISCELLANEOUS) ×2 IMPLANT
SUT CHROMIC 3 0 PS 2 (SUTURE) ×6 IMPLANT
SUT CHROMIC 3 0 SH 27 (SUTURE) ×3 IMPLANT
SUT CHROMIC 4 0 P 3 18 (SUTURE) ×9 IMPLANT
SUT PROLENE 2 0 SH DA (SUTURE) ×3 IMPLANT
SUT SILK 3 0 (SUTURE) ×1
SUT SILK 3-0 18XBRD TIE 12 (SUTURE) ×2 IMPLANT
SYR 50ML SLIP (SYRINGE) ×3 IMPLANT
SYR CONTROL 10ML LL (SYRINGE) ×3 IMPLANT
TOWEL GREEN STERILE (TOWEL DISPOSABLE) ×3 IMPLANT
TUBE CONNECTING 12X1/4 (SUCTIONS) ×3 IMPLANT
TUBE TRACH SHILEY  6 DIST  CUF (TUBING) ×3 IMPLANT
WATER STERILE IRR 1000ML POUR (IV SOLUTION) ×6 IMPLANT
WATER TABLETS ICX (MISCELLANEOUS) ×3 IMPLANT
YANKAUER SUCT BULB TIP NO VENT (SUCTIONS) ×3 IMPLANT

## 2019-02-18 NOTE — Op Note (Signed)
DATE OF PROCEDURE:  02/18/2019                              OPERATIVE REPORT  SURGEON:  [name], MD  PREOPERATIVE DIAGNOSES: 1. Laryngeal squamous cell carcinoma 2. Upper airway obstruction.  POSTOPERATIVE DIAGNOSES: 1. Laryngeal squamous cell carcinoma 2. Upper airway obstruction.  PROCEDURE PERFORMED:  Tracheostomy  ANESTHESIA:  General endotracheal tube anesthesia.  COMPLICATIONS:  None.  ESTIMATED BLOOD LOSS:  Minimal.  INDICATION FOR PROCEDURE: I was called emergently by anesthesia to treat the patient due to his severely difficult airway. [name] is a 59 y.o. male with a recently diagnosed supraglottic posterior laryngeal squamous cell carcinoma. He is scheduled to undergo dental extractions by Dr. Enrique Sack in anticipation of radiation treatment. On anesthesia induction, the patient was noted to have a severely narrowed airway. Multiple attempts were made before a small ET tube was finally advanced into the airway. Significant bleeding was noted. Anesthesia does not believe extubation can be performed safely. The decision was therefore made to proceed with emergent tracheostomy tube placement.   DESCRIPTION:  The patient was identified in the OR. General anesthesia was administered via an endotracheal tube. The patient was positioned and prepped and draped in the standard fashion for tracheostomy tube placement. 1% lidocaine with 1-100,000 epinephrine was injected at the anterior neck. A 2 cm vertical incision was made in the anterior necks, at the level slightly below the cricoid bone. The incision was carried down past the level of the platysma muscle. The strep muscles were identified and divided in midline. They were retracted laterally, exposing the thyroid gland. The thyroid was divided at midline and retracted laterally. The anterior tracheal wall was exposed. A tracheal window was made at the level of the second tracheal ring. The endotracheal tube was withdrawn. A # 6  cuffed Shiley tracheostomy tube was placed without difficulty. Good end tidal volume and CO2 return was noted. The tracheostomy tube was secured in place with 2-0 Prolene sutures and circumferential necktie. The care of the patient was transferred to the anesthesiologist. The patient was awakened from anesthesia without difficulty. He was transferred back to the intensive care unit in stable condition.  OPERATIVE FINDINGS:  A #6 cuffed Shiley tracheostomy tube was placed without difficulty.  SPECIMEN:  None.  FOLLOWUP CARE:  The patient will admitted to the ICU.   Jeanifer Halliday W Ajeet Casasola 02/18/2019 11:13 AM

## 2019-02-18 NOTE — Op Note (Signed)
OPERATIVE REPORT  Patient:            [name] Date of Birth:  01-Mar-1960 MRN:                [id number]   DATE OF PROCEDURE:  02/18/2019  PREOPERATIVE DIAGNOSES: 1.  Malignant neoplasm of the supraglottis 2.  Prechemoradiation therapy dental protocol 3.  Chronic apical periodontitis 4.  Dental caries 5.  Retained root segments 6.  Chronic periodontitis 7.  Loose teeth 8.  Maxillary left buccal exostoses  POSTOPERATIVE DIAGNOSES: 1.  Malignant neoplasm of the supraglottis 2.  Prechemoradiation therapy dental protocol 3.  Chronic apical periodontitis 4.  Dental caries 5.  Retained root segments 6.  Chronic periodontitis 7.  Loose teeth 8.  Maxillary left buccal exostoses  OPERATIONS: 1. Multiple extraction of tooth numbers 5-7, 10,11,14, 20 - 29, 31, and 32 2. 4 Quadrants of alveoloplasty 3.  Maxillary left buccal exostoses reductions   SURGEON: [name], [name]  ASSISTANT: Molli Posey (dental assistant)  ANESTHESIA: General anesthesia via oral endotracheal tube.  MEDICATIONS: 1. Ancef 2 g IV prior to invasive dental procedures. 2. Local anesthesia with a total utilization of 6 carpules each containing 34 mg of lidocaine with 0.017 mg of epinephrine as well as 2 carpules each containing 9 mg of bupivacaine with 0.009 mg of epinephrine.  SPECIMENS: There are 18 teeth that were discarded.  DRAINS: None  CULTURES: None  COMPLICATIONS: None  ESTIMATED BLOOD LOSS: 150 mLs.  INTRAVENOUS FLUIDS: 1000 mLs of Lactated ringers solution for dental procedures  INDICATIONS: The patient was recently diagnosed with malignant neoplasm of the supraglottis.  A medically necessary dental consultation was then requested to evaluate poor dentition.  The patient was examined and treatment planned for extraction of remaining teeth with alveoloplasty and preprosthetic surgery as needed in the operating room with general anesthesia.  This treatment plan was  formulated to decrease the risks and complications associated with dental infection from affecting the patient's systemic health and to assist in preventing osteoradionecrosis.  OPERATIVE FINDINGS: Patient was examined in operating room number 4.  The teeth were identified for extraction. The patient was noted be affected by chronic apical periodontitis, dental caries, multiple retained root segments, chronic periodontitis, loose teeth, and the presence of maxillary left buccal exostoses.   DESCRIPTION OF PROCEDURE: Patient was brought to the main operating room number 4. Patient was then placed in the supine position on the operating table.  General anesthesia was then induced per the anesthesia team. The patient was then prepped and draped in the usual manner for dental medicine procedure. A timeout was performed. The patient was identified and procedures were verified. A throat pack was placed at this time. The oral cavity was then thoroughly examined with the findings noted above. The patient was then ready for the dental medicine procedure as follows:  Local anesthesia was then administered sequentially with a total utilization of 6 carpules each containing 34 mg of lidocaine with 0.017 mg of epinephrine as well as 2 carpules  each containing 9 mg bupivacaine with 0.009 mg of epinephrine.  The Maxillary left and right quadrants first approached. Anesthesia was then delivered utilizing infiltration with lidocaine with epinephrine. A #15 blade incision was then made from the distal #3 and extended to the maxillary left tuberosity.  A  surgical flap was then carefully reflected. The maxillary teeth were then subluxated with a series of straight elevators. Tooth numbers 5,6,7,10,11,and 14 were then removed with  a 150 forceps without complications. Alveoloplasty was then performed utilizing a ronguers and bone file to help achieve primary closure.  The maxillary left flap was then further reflected to  expose the buccal exostoses in the area of tooth numbers 15 and 16.  These were then reduced utilizing a rongeurs and bone file appropriately.  The maxillary tissues were then approximated and trimmed appropriately.  The surgical sites were then irrigated with copious amounts of sterile saline.  A piece of Surgifoam impregnated with topical thrombin was then placed in each extraction socket appropriately.  The maxillary left surgical site was then closed with the maxillary left tuberosity and extended the mesial number 9 utilizing 3-0 Chromic Gut suture in a continuous erupted suture technique x1.  The maxillary right surgical site was then closed from the distal of #3 and extended to the mesial of #8 utilizing 3-0 Chromic Gut suture in a continuous erupted suture technique x1.    At this point in time, the mandibular quadrants were approached. The patient was given bilateral inferior alveolar nerve blocks and long buccal nerve blocks utilizing the bupivacaine with epinephrine. Further infiltration was then achieved utilizing the lidocaine with epinephrine. A 15 blade incision was then made from the distal of 18 and extended to the distal of #32. A surgical flap was then carefully reflected.  The lower teeth were then subluxated with a series of straight elevators.  Tooth numbers 20 through 29 were then removed with a 151 forceps without complications.  Tooth numbers 31 and 32 then had the coronal aspect of the teeth removed with a 17 forceps leaving the roots remaining.  Further bone was then removed appropriately and the retained roots were then removed with a rongeurs without further complications.  Alveoloplasty was then performed utilizing a rongeurs and bone file to help achieve primary closure. The tissues were approximated and trimmed appropriately. The surgical sites were then irrigated with copious amounts of sterile saline.  A piece of Surgifoam impregnated with topical thrombin was then placed in each  extraction socket appropriately.  The tissues were approximated and trimmed appropriately.  The mandibular left surgical site was then closed from the distal of #18 and extended to the mesial #24 utilizing 3-0 Chromic Gut suture in a continuous erupted suture technique x1.  The mandibular right surgical site was then closed from the distal #32 and extended the mesial #25 utilizing 3-0 Chromic Gut suture in a continuous interrupted suture technique x1.  3 interrupted sutures were then placed to further close the surgical site in the area of #22 and #27 on the facial aspect utilizing 4-0 Chromic Gut material.    At this point in time, the entire mouth was irrigated with copious amounts of sterile saline. The patient was examined for complications, seeing none, the dental medicine procedure was deemed to be complete. The throat pack was removed at this time. A series of 4 x 4 gauze impregnated with Amicar 5% rinse were placed in the mouth to aid hemostasis.  All counts were correct for the dental medicine procedure.    The patient was then handed over to the anesthesia team for final disposition.  Dr. Doroteo Glassman, the anesthesiologist, then contacted Dr. Benjamine Mola (ENT) concerning the difficult oral endotracheal intubation.  Dr. Benjamine Mola in consultation with Dr. Concha Norway anesthesiologist, determined that the patient would benefit from a tracheostomy procedure at this time.  Please see Dr. Deeann Saint operative report for continuation of the procedure.  Dr. Benjamine Mola indicated that the patient was  to be admitted by Critical Care Medicine team and placed in an ICU bed.  Head and Neck Cancer Coordiantor was contacted to determine if Port-A-cath could be placed while an Inpatient at [facility].   [name], [name].

## 2019-02-18 NOTE — Telephone Encounter (Signed)
Called regarding recent US carotid, no answer, left vm. AW

## 2019-02-18 NOTE — Progress Notes (Signed)
PRE-OPERATIVE NOTE:  02/18/2019 [name] [id number]  VITALS: BP (!) 150/96   Pulse 61   Temp 98.4 F (36.9 C) (Oral)   Resp 18   Ht 5\' 8"  (1.727 m)   Wt 81.6 kg   SpO2 95%   BMI 27.37 kg/m   Lab Results  Component Value Date   WBC 7.5 02/17/2019   HGB 11.9 (L) 02/17/2019   HCT 35.2 (L) 02/17/2019   MCV 94.6 02/17/2019   PLT 269 02/17/2019   BMET    Component Value Date/Time   NA 132 (L) 02/17/2019 1419   K 4.5 02/17/2019 1419   CL 98 02/17/2019 1419   CO2 23 02/17/2019 1419   GLUCOSE 101 (H) 02/17/2019 1419   BUN 11 02/17/2019 1419   BUN 13 12/24/2018 0918   CREATININE 0.99 02/17/2019 1419   CREATININE 1.23 10/28/2012 1558   CALCIUM 9.7 02/17/2019 1419   GFRNONAA >60 02/17/2019 1419   GFRAA >60 02/17/2019 1419    Lab Results  Component Value Date   INR 1.0 08/02/2018   INR 0.99 11/05/2012   INR 0.98 10/28/2012   No results found for: PTT   [name] presents for multiple dental extractions with alveoloplasty and preprosthetic surgery as needed in the operating room with general anesthesia.     SUBJECTIVE: The patient denies any acute medical or dental changes and agrees to proceed with treatment as planned.  EXAM: No sign of acute dental changes.  ASSESSMENT: Patient is affected by chronic apical periodontitis, retained root segments, dental caries, chronic periodontitis, buccal exostoses, and loose teeth.  PLAN: Patient agrees to Proceed with treatment as planned in the operating room as previously discussed and accepts the risks, benefits, and complications of the proposed treatment. Patient is aware of the risk for bleeding, bruising, swelling, infection, pain, nerve damage, sinus involvement, root tip fracture, mandible fracture, and the risks of complications associated with the anesthesia. Patient also is aware of the potential for other complications and including stroke or death.   [name], [name]

## 2019-02-18 NOTE — H&P (Addendum)
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  During induction airway was quite difficult with friable tissue requiring several attempts before airway cannulated.  Dental extraction was completed.  It was decided to proceed with tracheostomy for airway protection given concern for airway obstruction  History of present illness   59 year old male patient who presented to [name] for elective multiple dental extractions prior to initiating chemo and radiation therapy to newly diagnosed supraglottic posterior laryngeal squamous cell carcinoma on 2/16.  During anesthesia and induction was noted to have very difficult airway with friable tissue requiring multiple attempts before a size 6 endotracheal tube was successfully placed.  There is significant bleeding and trauma, raising concern for airway protection.  His ENT was consulted intraoperatively, and after successful dental extractions it was decided to proceed with tracheostomy for airway protection.  A size 6 cuffed tracheostomy was successfully placed, he was weaned from mechanical ventilation in the recovery room, and critical care was asked to admit.  Past Medical History  New diagnosis of laryngeal carcinoma with squamous cell.  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  Significant Hospital Events   2/16: Presented for dental extraction, also requiring tracheostomy for upper airway  obstruction  Consults:  ENT, consulted intraoperatively.  Dr. Benjamine [name]  Procedures:  Multiple dental extractions and tracheostomy 2/16  Significant Diagnostic Tests:    Micro Data:    Antimicrobials:  Intraoperative Ancef x1  Interim history/subjective:  Resting comfortably in the PACU  Objective   Blood pressure (Abnormal) 154/97, pulse (Abnormal) 105, temperature (Abnormal) 97 F (36.1 C), resp. rate 10, height 5\' 8"  (1.727 m), weight 81.6 kg, SpO2 95 %.        Intake/Output Summary (Last 24 hours) at 02/18/2019 1152 Last data filed at 02/18/2019 1140 Gross per 24 hour  Intake 1600 ml  Output 200 ml  Net 1400 ml   Filed Weights   02/18/19 0606  Weight: 81.6 kg    Examination: General: 59 year old white male currently resting sitting upright in bed he is in no acute distress none humidified aerosol trach collar. HENT: Size 6 cuffed tracheostomy in place.  The cuff is currently inflated.  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 and initiating PMV trials Anticipate he will be discharged to home with a cuffless  tracheostomy Treat  pain Stopped Plavix preoperatively.  Per handoff the plan was to keep this off indefinitely Checking post op film  Squamous cell head neck cancer Plan Will eventually need Port-A-Cath insertion We will clear this with dental surgical team, want to be sure no seeding of catheter post obstruction  Hypertension Plan Monitor, will add as needed therapy as we are holding his home medications  History of GERD Plan PPI  Best practice:  Diet: N.p.o. Pain/Anxiety/Delirium protocol (if indicated): Not indicated, he is getting as needed morphine VAP protocol (if indicated): Not indicated DVT prophylaxis: SCD GI prophylaxis: PPI Glucose control: Not indicated Mobility: Out of bed as tolerated Code Status: Full code Family Communication: Wife updated by surgical team Disposition:  Admit to ICU overnight Labs   CBC: Recent Labs  Lab 02/17/19 1419  WBC 7.5  HGB 11.9*  HCT 35.2*  MCV 94.6  PLT 814    Basic Metabolic Panel: Recent Labs  Lab 02/17/19 1419  NA 132*  K 4.5  CL 98  CO2 23  GLUCOSE 101*  BUN 11  CREATININE 0.99  CALCIUM 9.7   GFR: Estimated Creatinine Clearance: 79.6 mL/min (by C-G formula based on SCr of 0.99 mg/dL). Recent Labs  Lab 02/17/19 1419  WBC 7.5    Liver Function Tests: Recent Labs  Lab 02/17/19 1419  AST 20  ALT 15  ALKPHOS 61  BILITOT 0.9  PROT 6.9  ALBUMIN 3.9   No results for input(s): LIPASE, AMYLASE in the last 168 hours. No results for input(s): AMMONIA in the last 168 hours.  ABG    Component Value Date/Time   TCO2 22 07/20/2016 1135     Coagulation Profile: No results for input(s): INR, PROTIME in the last 168 hours.  Cardiac Enzymes: No results for input(s): CKTOTAL, CKMB, CKMBINDEX, TROPONINI in the last 168 hours.  HbA1C: Hgb A1c MFr Bld  Date/Time Value Ref Range Status  08/01/2018 03:51 AM 5.4 4.8 - 5.6 % Final    Comment:    (NOTE) Pre diabetes:          5.7%-6.4% Diabetes:               >6.4% Glycemic control for   <7.0% adults with diabetes     CBG: No results for input(s): GLUCAP in the last 168 hours.  Review of Systems:   Not able, denies pain currently  Past Medical History  He,  has a past medical history of Anxiety, Arthritis, Bradycardia, Cancer of larynx (HCC), Carotid artery stenosis, Chronic lower back pain, Coronary artery disease, GERD (gastroesophageal reflux disease), History of hiatal hernia, Hypercholesteremia, Hypertension, and Stroke ([place]) (07/31/2018).   Surgical History    Past Surgical History:  Procedure Laterality Date  . CARDIAC CATHETERIZATION  10/29/2012  . CORONARY ANGIOPLASTY WITH STENT PLACEMENT  11/05/2012  . DIRECT LARYNGOSCOPY N/A 01/02/2019   Procedure: MICRO DIRECT LARYNGOSCOPY BIOPSY OF LARYNGEAL MASS;  Surgeon: [name], MD;  Location: [facility];  Service: ENT;  Laterality: N/A;  . HEMORRHOID SURGERY  ~ 2010  . INSERTION OF MESH N/A 06/02/2015   Procedure: INSERTION OF MESH;  Surgeon: [name], MD;  Location: [facility];  Service: General;  Laterality: N/A;  . IR ANGIO INTRA EXTRACRAN SEL COM CAROTID INNOMINATE BILAT MOD SED  08/02/2018  . IR ANGIO VERTEBRAL SEL VERTEBRAL BILAT MOD SED  08/02/2018  . LEFT HEART CATHETERIZATION WITH CORONARY ANGIOGRAM N/A 10/30/2012   Procedure: LEFT HEART CATHETERIZATION WITH CORONARY ANGIOGRAM;  Surgeon: [name], MD;  Location: [facility] CATH LAB;  Service: Cardiovascular;  Laterality: N/A;  . PERCUTANEOUS CORONARY ROTOBLATOR INTERVENTION (PCI-R) N/A 11/05/2012   Procedure: PERCUTANEOUS CORONARY ROTOBLATOR INTERVENTION (PCI-R);  Surgeon: [name], MD;  Location[facility];  Service: Cardiovascular;  Laterality: N/A;  . UMBILICAL HERNIA REPAIR N/A 06/02/2015   Procedure: UMBILICAL HERNIORRHAPHY WITH MESH;  Surgeon: [name], MD;  Location: [facility];  Service: General;  Laterality: N/A;     Social History   reports that he quit smoking about 12 years ago. His smoking use included  cigarettes. He started smoking about 41 years ago. He has a 96.00 pack-year smoking history. He has quit using smokeless tobacco.  His smokeless tobacco use included chew. He reports current alcohol use of about 12.0 standard drinks of alcohol per week. He reports current drug use. Drug: Marijuana.   Family History   His family history includes Heart disease in his father and sister; Hypertension in his father, maternal grandfather, maternal grandmother, mother, paternal grandfather, and paternal grandmother.   Allergies Allergies  Allergen Reactions  . Prednisone Other (See Comments)    Chest pain     Home Medications  Prior to Admission medications   Medication Sig Start Date End Date Taking? Authorizing Provider  amLODipine (NORVASC) 5 MG tablet Take 1 tablet (5 mg total) by mouth daily. 12/19/18  Yes Frann Rider, NP  aspirin EC 81 MG tablet Take 81 mg by mouth daily.   Yes [provider]  atorvastatin (LIPITOR) 80 MG tablet Take 1 tablet (80 mg total) by mouth every evening. 10/23/17  Yes Herminio Commons, MD  clopidogrel (PLAVIX) 75 MG tablet Take 1 tablet (75 mg total) by mouth daily. Patient not taking: Reported on 02/17/2019 12/24/18  Yes McCue, Janett Billow, NP  HYDROcodone-acetaminophen (NORCO) 10-325 MG tablet Take 1 tablet by mouth every 4 (four) hours as needed. 06/02/15  Yes [name], MD  lisinopril (ZESTRIL) 20 MG tablet Take 1 tablet (20 mg total) by mouth daily. Patient taking differently: Take 20 mg by mouth 2 (two) times daily.  08/03/18  Yes Vann, Jessica U, DO  metoprolol tartrate (LOPRESSOR) 25 MG tablet TAKE 1/2 TABLET BY MOUTH TWICE DAILY Patient taking differently: Take 12.5 mg by mouth 2 (two) times daily.  01/25/15  Yes Herminio Commons, MD  omeprazole (PRILOSEC) 20 MG capsule Take 20 mg by mouth daily.  07/22/18  Yes [provider]  penicillin v potassium (VEETID) 500 MG tablet Take 1,000 mg by mouth daily.  02/04/19  Yes [provider]  traMADol (ULTRAM) 50 MG tablet Take 50-100 mg by mouth 4 (four) times daily as needed (pain.).  01/29/19  Yes [provider]  CISPLATIN IV Inject into the vein. Every 21 days in conjunction with radiation therapy beginning 03/04/2019 03/04/19   [provider]  nitroGLYCERIN (NITROSTAT) 0.4 MG SL tablet Place 1 tablet (0.4 mg total) under the tongue every 5 (five) minutes as needed for chest pain (CP or SOB). Patient not taking: Reported on 02/17/2019 07/27/14   Herminio Commons, MD     Erick Colace ACNP-BC [place] Pager # [phone] OR # [phone] if no answer

## 2019-02-18 NOTE — H&P (Signed)
02/18/2019  Patient:            [name] Date of Birth:  09-19-1960 MRN:                [id number]   BP (!) 150/96   Pulse 61   Temp 98.4 F (36.9 C) (Oral)   Resp 18   Ht 5\' 8"  (1.727 m)   Wt 81.6 kg   SpO2 95%   BMI 27.37 kg/m    [name] is a 59 year old male with history of laryngeal cancer.  Patient with anticipated chemoradiation therapy.  Patient seen as part of a pre-chemoradiation therapy dental protocol examination.  Patient with treatment plan for extraction of remaining teeth with alveoloplasty and preprosthetic surgery as needed in the operating room with general anesthesia. Patient denies any acute medical or dental changes.  Please see note from Dr. Arnoldo Morale to act as H and P for the dental operating room procedure.   [name], [name]    [name]; [id number]; [birth year]  HPI  Patient is a 59 year old white male who was referred to my care by Dr. Delton Coombes for Port-A-Cath insertion. He is undergoing chemotherapy for supraglottic carcinoma. He denies any significant dysphagia. He is having teeth pulled due to abscesses in the near future. He has 0 out of 10 pain.      Past Medical History:  Diagnosis Date  . Anxiety   . Arthritis    back  . Bradycardia    a. During 11/2012 adm: lopressor decreased.  . Carotid artery stenosis    65% RICA, mild-moderate L caroitd bulb 08/02/18 angiography, six month f/u rec  . 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 Coronary artery disease    a. Diag cath 10/2012 for CP/abnormal nuc -> planned PCI s/p LAD atherectomy/DES placement 11/05/12.  Marland Kitchen GERD (gastroesophageal reflux disease)   . Hypercholesteremia   . Hypertension   . Stroke ([place]) 07/31/2018   bilateral vertebrobasilar occlusion; "left side weaker thanit was before."        Past Surgical History:  Procedure Laterality Date  . CARDIAC CATHETERIZATION  10/29/2012  . CORONARY  ANGIOPLASTY WITH STENT PLACEMENT  11/05/2012  . DIRECT LARYNGOSCOPY N/A 01/02/2019   Procedure: MICRO DIRECT LARYNGOSCOPY BIOPSY OF LARYNGEAL MASS; Surgeon: [name], MD; Location: [place] OR; Service: ENT; Laterality: N/A;  . HEMORRHOID SURGERY  ~ 2010  . INSERTION OF MESH N/A 06/02/2015   Procedure: INSERTION OF MESH; Surgeon: [name], MD; Location: [facility]; Service: General; Laterality: N/A;  . IR ANGIO INTRA EXTRACRAN SEL COM CAROTID INNOMINATE BILAT MOD SED  08/02/2018  . IR ANGIO VERTEBRAL SEL VERTEBRAL BILAT MOD SED  08/02/2018  . LEFT HEART CATHETERIZATION WITH CORONARY ANGIOGRAM N/A 10/30/2012   Procedure: LEFT HEART CATHETERIZATION WITH CORONARY ANGIOGRAM; Surgeon: [name], MD; Location: [place] [facility] CATH LAB; Service: Cardiovascular; Laterality: N/A;  . PERCUTANEOUS CORONARY ROTOBLATOR INTERVENTION (PCI-R) N/A 11/05/2012   Procedure: PERCUTANEOUS CORONARY ROTOBLATOR INTERVENTION (PCI-R); Surgeon: [name], MD; Location: [facility] CATH LAB; Service: Cardiovascular; Laterality: N/A;  . UMBILICAL HERNIA REPAIR N/A 06/02/2015   Procedure: UMBILICAL HERNIORRHAPHY WITH MESH; Surgeon: [name], MD; Location: [facility]; Service: General; Laterality: N/A;        Family History  Problem Relation Age of Onset  . Hypertension Mother   . Heart disease Father   . Hypertension Father   . Heart  disease Sister   . Hypertension Maternal Grandmother   . Hypertension Maternal Grandfather   . Hypertension Paternal Grandmother   . Hypertension Paternal Grandfather          Current Outpatient Medications on File Prior to Visit  Medication Sig Dispense Refill  . amLODipine (NORVASC) 5 MG tablet Take 1 tablet (5 mg total) by mouth daily. 30 tablet 0  . aspirin EC 81 MG tablet Take 81 mg by mouth daily.    Marland Kitchen atorvastatin (LIPITOR) 80 MG tablet Take 1 tablet (80 mg total) by mouth every evening. 90 tablet 3  . clopidogrel (PLAVIX) 75 MG tablet Take 1 tablet (75 mg total) by mouth daily. 90 tablet 3  .  DULoxetine (CYMBALTA) 30 MG capsule Take 30 mg by mouth daily.    . famotidine (PEPCID) 20 MG tablet Take 20 mg by mouth at bedtime.    Marland Kitchen lisinopril (ZESTRIL) 20 MG tablet Take 1 tablet (20 mg total) by mouth daily. (Patient taking differently: Take 20 mg by mouth 2 (two) times daily. ) 30 tablet 0  . metoprolol tartrate (LOPRESSOR) 25 MG tablet TAKE 1/2 TABLET BY MOUTH TWICE DAILY (Patient taking differently: Take 12.5 mg by mouth 2 (two) times daily. ) 30 tablet 3  . nitroGLYCERIN (NITROSTAT) 0.4 MG SL tablet Place 1 tablet (0.4 mg total) under the tongue every 5 (five) minutes as needed for chest pain (CP or SOB). 25 tablet 3  . omeprazole (PRILOSEC) 20 MG capsule Take 20 mg by mouth daily.     . traMADol (ULTRAM) 50 MG tablet     . HYDROcodone-acetaminophen (NORCO) 10-325 MG tablet Take 1 tablet by mouth every 4 (four) hours as needed. (Patient not taking: Reported on 02/06/2019) 50 tablet 0   [name] current facility-administered medications on file prior to visit.        Allergies  Allergen Reactions  . Prednisone Other (See Comments)    Chest pain   Social History       Substance and Sexual Activity  Alcohol Use Yes  . Alcohol/week: 18.0 standard drinks  . Types: 18 Cans of beer per week   Social History       Tobacco Use  Smoking Status Former Smoker  . Packs/day: 3.00  . Years: 32.00  . Pack years: 96.00  . Types: Cigarettes  . Start date: 08/29/1977  . Quit date: 11/29/2006  . Years since quitting: 12.2  Smokeless Tobacco Former Systems developer  . Types: Chew  Tobacco Comment   11/05/2012 "chewed tobacco when I play ball; aien't chewed since age 61"  Review of Systems  Constitutional: Negative.  HENT: Negative.  Eyes: Positive for blurred vision.  Respiratory: Negative.  Cardiovascular: Negative.  Gastrointestinal: Negative.  Genitourinary: Negative.  Musculoskeletal: Positive for back pain.  Skin: Negative.  Neurological: Negative.  Endo/Heme/Allergies: Negative.   Psychiatric/Behavioral: Negative.  Objective     Vitals:   02/06/19 1036  BP: 137/88  Pulse: (!) 54  Resp: 16  Temp: 98.5 F (36.9 C)  SpO2: 95%  Physical Exam  Vitals reviewed.  Constitutional:  Appearance: Normal appearance. He is normal weight. He is not ill-appearing.  HENT:  Head: Normocephalic and atraumatic.  Cardiovascular:  Rate and Rhythm: Normal rate and regular rhythm.  Heart sounds: Normal heart sounds. [name] murmur. [name] friction rub. [name] gallop.  Pulmonary:  Effort: Pulmonary effort is normal. [name] respiratory distress.  Breath sounds: Normal breath sounds. [name] stridor. [name] wheezing, rhonchi or rales.  Skin:  General: Skin  is warm and dry.  Neurological:  Mental Status: He is alert and oriented to person, place, and time.  Dr. Tomie China notes reviewed  Assessment  Supraglottic carcinoma, need for central venous access  Plan  Patient will be scheduled for Port-A-Cath insertion once his abscessed teeth are removed as I do want the port to become secondarily infected. The risks and benefits of the procedure including bleeding, infection, and pneumothorax were fully explained to the patient, who gave informed consent. He will contact us as soon as he has had his teeth pulled. He will need to stop his Plavix 5 days before the procedure.  02/17/2019 7:48 AM [name], MD [name], MD In Basket

## 2019-02-18 NOTE — Transfer of Care (Signed)
Immediate Anesthesia Transfer of Care Note  Patient: [name]  Procedure(s) Performed: Extraction of tooth #'s 5-7, [phone], 31 and 32 with alveoloplasty and maxiillary left buccal exostoses reductions (N/A ) Tracheostomy (N/A Neck)  Patient Location: PACU  Anesthesia Type:General  Level of Consciousness: awake, oriented and patient cooperative  Airway & Oxygen Therapy: Patient Spontanous Breathing and Patient connected to tracheostomy mask oxygen  Post-op Assessment: Report given to RN and Post -op Vital signs reviewed and stable  Post vital signs: Reviewed  Last Vitals:  Vitals Value Taken Time  BP 154/97 02/18/19 1134  Temp 36.1 C 02/18/19 1134  Pulse 86 02/18/19 1141  Resp 17 02/18/19 1141  SpO2 100 % 02/18/19 1141  Vitals shown include unvalidated device data.  Last Pain:  Vitals:   02/18/19 5277  TempSrc:   PainSc: 0-No pain      Patients Stated Pain Goal: 2 (82/42/35 3614)  Complications: No apparent anesthesia complications

## 2019-02-18 NOTE — Anesthesia Postprocedure Evaluation (Signed)
Anesthesia Post Note  Patient: [name]  Procedure(s) Performed: Extraction of tooth #'s 5-7, [phone], 31 and 32 with alveoloplasty and maxiillary left buccal exostoses reductions (N/A ) Tracheostomy (N/A Neck)     Patient location during evaluation: PACU Anesthesia Type: General Level of consciousness: awake and alert, oriented and patient cooperative Pain management: pain level controlled Vital Signs Assessment: post-procedure vital signs reviewed and stable Respiratory status: spontaneous breathing, nonlabored ventilation, respiratory function stable and patient connected to tracheostomy mask oxygen Cardiovascular status: blood pressure returned to baseline and stable Postop Assessment: no apparent nausea or vomiting Anesthetic complications: no Comments: Unable to pass 7.5 and 7.0 cuffed ETT on induction, eventually passed 6.5 oral ETT with some twisting. Cancerous tissue very friable, so some airway bleeding was visualized. Decision to consult Dr. Jacobo Forest (pt known to him), how agreed that patient should have urgent tracheostomy instead of attempted a tenuous extubation. Dental work completed and then Dr. Benjamine Mola in for tracheostomy, uneventful.    Last Vitals:  Vitals:   02/18/19 1134 02/18/19 1151  BP: (!) 154/97 (!) 156/93  Pulse: (!) 105 93  Resp: 10 20  Temp: (!) 36.1 C   SpO2: 95% 100%    Last Pain:  Vitals:   02/18/19 1134  TempSrc:   PainSc: [place]

## 2019-02-18 NOTE — Progress Notes (Signed)
[facility] CSW Progress Notes  CSW noted patient is having procedure done today, rescheduled todays visit for next Tuesday.  Left VM requesting call back if desired.  Edwyna Shell, LCSW Clinical Social Worker Phone:  [phone] Cell:  [phone]

## 2019-02-18 NOTE — Patient Instructions (Addendum)
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  You will be treated weekly in conjunction with radiation therapy with cisplatin.  The intent of treatment is to cure your disease. You will see the doctor regularly throughout treatment.  We will obtain blood work from you prior to every treatment and monitor your results to make sure it is safe to give your treatment. The doctor monitors your response to treatment by the way you are feeling, your blood work, and by obtaining scans periodically.  There will be wait times while you are here for treatment.  It will take about 30 minutes to 1 hour for your lab work to result.  Then there will be wait times while pharmacy mixes your medications.     Medications you will receive in the clinic prior to your chemotherapy medications:  Aloxi:  ALOXI is used in adults to help prevent nausea and vomiting that happens with certain chemotherapy drugs.  Aloxi is a long acting medication, and will remain in your system for about two days.   Emend:  This is an anti-nausea medication that is used with Aloxi to help prevent nausea and vomiting caused by chemotherapy.  Dexamethasone:  This is a steroid given prior to chemotherapy to help prevent allergic reactions; it may also help prevent and control nausea and diarrhea.    CISPLATIN  About This Drug Cisplatin is a drug used to treat cancer. This drug is given in the vein (IV).  This will take 1 hour to infuse.  With this drug you will receive 2 hours of IV hydration prior to administration and 1 hour of IV hydration after administration.  This is to help protect your kidneys.  You will have to urinate 200 mL prior to receiving this medication.  We will give you something to measure your urine in.   Possible Side Effects (More Common) . This drug may affect how your kidneys work. Your kidney function will be checked as needed. . Electrolyte  changes. Your blood will be checked for electrolyte changes as needed. . High-frequency hearing loss may occur. You will get IV fluids before and during the Cisplatin infusion to help prevent this. You may also get ringing in the ears. . Bone marrow depression. This is a decrease in the number of white blood cells, red blood cells, and platelets. This may raise your risk of infection, make you tired and weak (fatigue), and raise your risk of bleeding. . Nausea and throwing up (vomiting). These symptoms may happen within a few hours after your treatment and may last for a few days to a week. Medicines are available to stop or lessen these side effects.  Possible Side Effects (Less Common) . Effects on the nerves are called peripheral neuropathy. You may feel numbness or pain in your hands and feet. It may be hard for you to button your clothes, open jars, or walk as usual. The effect on the nerves may get worse with more doses of the drug. These effects get better in some people after the drug is stopped, but it does not get better in all people. Marland Kitchen Blurred vision or other changes in eyesight. . Soreness of the mouth and throat. You may have red areas, white patches, or sores that hurt. . Hair loss. You may notice your hair getting thin. Some patients lose their hair. Your hair often grows back when treatment is done.  Allergic Reactions Allergic reactions to  this drug are rare, but may happen in some patients. Signs of allergic reactions to this drug may be a rash, fever, chills, feeling dizzy, trouble breathing, and/or feeling that your heart is beating in a fast or not normal way.  Treating Side Effects . Drink 6-8 cups of fluids each day unless your doctor has told you to limit your fluid intake due to some other health problem. A cup is 8 ounces of fluid. If you throw up or have loose bowel movements you should drink more fluids so that you do not become dehydrated (lack water in the body due to  losing too much fluid). . If you have numbness and tingling in your hands and feet, be careful when cooking, walking, and handling sharp objects and hot liquids. . Mouth care is very important. Your mouth care should consist of routine, gentle cleaning of your teeth or dentures and rinsing your mouth with a mixture of 1/2 teaspoon of salt in 8 ounces of water or  teaspoon of baking soda in 8 ounces of water. This should be done at least after each meal and at bedtime. . If you have mouth sores, avoid mouthwash that has alcohol. Also avoid alcohol and smoking because they can bother your mouth and throat. . Talk with your nurse about getting a wig before you lose your hair. Also, call the [facility] at [phone]-ACS-2345 to find out information about the "Look Good, Feel Better" program close to where you live. It is a free program where women getting chemotherapy can learn about wigs, turbans and scarves as well as makeup techniques and skin and nail care.  Food and Drug Interactions  There are no known interactions of Cisplatin with food. This drug may interact with other medicines. Tell your doctor and pharmacist about all the medicines and dietary supplements (vitamins, minerals, herbs and others) that you are taking at this time. The safety and use of dietary supplements and alternative diets are often not known. Using these might affect your cancer or interfere with your treatment. Until more is known, you should not use dietary supplements or alternative diets without your cancer doctor's help.  When to Call the Doctor  Call your doctor or nurse right away if you have any of these symptoms: . Rash or itching . Feeling dizzy or lightheaded . Wheezing or trouble breathing . Swelling of the face . Fever of 100.5 F (38 C) or above . Chills . Easy bleeding or bruising . Decreased urine . Weight gain of 5 pounds in one week (fluid retention) . Nausea that stops you from eating or  drinking . Throwing up more than 3 times a day Call your doctor or nurse as soon as possible if you have these symptoms: . Numbness, tingling, decreased feeling or weakness in fingers, toes, arms, or legs . Trouble walking or changes in the way you walk, feeling clumsy when buttoning clothes, opening jars, or other routine hand motions . Blurred vision or other changes in eyesight . Changes in hearing, ringing in the ears . Pain in your mouth or throat that makes it hard to eat or drink . Fatigue that interferes with your daily activities  Sexual Problems and Reproductive Concerns  . Infertility warning: Sexual problems and reproduction concerns may occur. In both men and women, this drug may affect your ability to have children. This cannot be determined before your treatment. Speak with your doctor or nurse if you plan to have children. Ask for  information on sperm or egg banking. . In men, this drug may interfere with your ability to make sperm, but it should not change your ability to have sexual relations. . In women, menstrual bleeding may become irregular or stop while you are receiving this drug. Do not assume that you cannot become pregnant if you do not have a menstrual period. . Women may experience signs of menopause like vaginal dryness, itching, and pain during sexual relations . Genetic counseling is available for you to talk about the effects of this drug therapy on future pregnancies. Also, a genetic counselor can look at the possible risk of problems in the unborn baby due to this medicine if an exposure happens during pregnancy.  SELF CARE ACTIVITIES WHILE RECEIVING CHEMOTHERAPY:  Hydration Increase your fluid intake 48 hours prior to treatment and drink at least 8 to 12 cups (64 ounces) of water/decaffeinated beverages per day after treatment. You can still have your cup of coffee or soda but these beverages do not count as part of your 8 to 12 cups that you need to drink  daily. No alcohol intake.  Medications Continue taking your normal prescription medication as prescribed.  If you start any new herbal or new supplements please let us know first to make sure it is safe.  Mouth Care Have teeth cleaned professionally before starting treatment. Keep dentures and partial plates clean. Use soft toothbrush and do not use mouthwashes that contain alcohol. Biotene is a good mouthwash that is available at most pharmacies or may be ordered by calling [phone]. Use warm salt water gargles (1 teaspoon salt per 1 quart warm water) before and after meals and at bedtime. If you need dental work, please let the doctor know before you go for your appointment so that we can coordinate the best possible time for you in regards to your chemo regimen. You need to also let your dentist know that you are actively taking chemo. We may need to do labs prior to your dental appointment.  Skin Care Always use sunscreen that has not expired and with SPF (Sun Protection Factor) of 50 or higher. Wear hats to protect your head from the sun. Remember to use sunscreen on your hands, ears, face, & feet.  Use good moisturizing lotions such as udder cream, eucerin, or even Vaseline. Some chemotherapies can cause dry skin, color changes in your skin and nails.    . Avoid long, hot showers or baths. . Use gentle, fragrance-free soaps and laundry detergent. . Use moisturizers, preferably creams or ointments rather than lotions because the thicker consistency is better at preventing skin dehydration. Apply the cream or ointment within 15 minutes of showering. Reapply moisturizer at night, and moisturize your hands every time after you wash them.  Hair Loss (if your doctor says your hair will fall out)  . If your doctor says that your hair is likely to fall out, decide before you begin chemo whether you want to wear a wig. You may want to shop before treatment to match your hair color. . Hats,  turbans, and scarves can also camouflage hair loss, although some people prefer to leave their heads uncovered. If you go bare-headed outdoors, be sure to use sunscreen on your scalp. . Cut your hair short. It eases the inconvenience of shedding lots of hair, but it also can reduce the emotional impact of watching your hair fall out. . Don't perm or color your hair during chemotherapy. Those chemical treatments are already  damaging to hair and can enhance hair loss. Once your chemo treatments are done and your hair has grown back, it's OK to resume dyeing or perming hair.  With chemotherapy, hair loss is almost always temporary. But when it grows back, it may be a different color or texture. In older adults who still had hair color before chemotherapy, the new growth may be completely gray.  Often, new hair is very fine and soft.  Infection Prevention Please wash your hands for at least 30 seconds using warm soapy water. Handwashing is the #1 way to prevent the spread of germs. Stay away from sick people or people who are getting over a cold. If you develop respiratory systems such as green/yellow mucus production or productive cough or persistent cough let us know and we will see if you need an antibiotic. It is a good idea to keep a pair of gloves on when going into grocery stores/[employer] to decrease your risk of coming into contact with germs on the carts, etc. Carry alcohol hand gel with you at all times and use it frequently if out in public. If your temperature reaches 100.5 or higher please call the clinic and let us know.  If it is after hours or on the weekend please go to the ER if your temperature is over 100.5.  Please have your own personal thermometer at home to use.    Sex and bodily fluids If you are going to have sex, a condom must be used to protect the person that isn't taking chemotherapy. Chemo can decrease your libido (sex drive). For a few days after chemotherapy, chemotherapy can be  excreted through your bodily fluids.  When using the toilet please close the lid and flush the toilet twice.  Do this for a few day after you have had chemotherapy.   Effects of chemotherapy on your sex life Some changes are simple and won't last long. They won't affect your sex life permanently.  Sometimes you may feel: . too tired . not strong enough to be very active . sick or sore  . not in the mood . anxious or low Your anxiety might not seem related to sex. For example, you may be worried about the cancer and how your treatment is going. Or you may be worried about money, or about how you family are coping with your illness.  These things can cause stress, which can affect your interest in sex. It's important to talk to your partner about how you feel.  Remember - the changes to your sex life don't usually last long. There's usually no medical reason to stop having sex during chemo. The drugs won't have any long term physical effects on your performance or enjoyment of sex. Cancer can't be passed on to your partner during sex  Contraception It's important to use reliable contraception during treatment. Avoid getting pregnant while you or your partner are having chemotherapy. This is because the drugs may harm the baby. Sometimes chemotherapy drugs can leave a man or woman infertile.  This means you would not be able to have children in the future. You might want to talk to someone about permanent infertility. It can be very difficult to learn that you may no longer be able to have children. Some people find counselling helpful. There might be ways to preserve your fertility, although this is easier for men than for women. You may want to speak to a fertility expert. You can talk about sperm banking  or harvesting your eggs. You can also ask about other fertility options, such as donor eggs. If you have or have had breast cancer, your doctor might advise you not to take the contraceptive pill. This  is because the hormones in it might affect the cancer. It is not known for sure whether or not chemotherapy drugs can be passed on through semen or secretions from the vagina. Because of this some doctors advise people to use a barrier method if you have sex during treatment. This applies to vaginal, anal or oral sex. Generally, doctors advise a barrier method only for the time you are actually having the treatment and for about a week after your treatment. Advice like this can be worrying, but this does not mean that you have to avoid being intimate with your partner. You can still have close contact with your partner and continue to enjoy sex.  Animals If you have cats or birds we just ask that you not change the litter or change the cage.  Please have someone else do this for you while you are on chemotherapy.   Food Safety During and After Cancer Treatment Food safety is important for people both during and after cancer treatment. Cancer and cancer treatments, such as chemotherapy, radiation therapy, and stem cell/bone marrow transplantation, often weaken the immune system. This makes it harder for your body to protect itself from foodborne illness, also called food poisoning. Foodborne illness is caused by eating food that contains harmful bacteria, parasites, or viruses.  Foods to avoid Some foods have a higher risk of becoming tainted with bacteria. These include: Marland Kitchen Unwashed fresh fruit and vegetables, especially leafy vegetables that can hide dirt and other contaminants . Raw sprouts, such as alfalfa sprouts . Raw or undercooked beef, especially ground beef, or other raw or undercooked meat and poultry . Fatty, fried, or spicy foods immediately before or after treatment.  These can sit heavy on your stomach and make you feel nauseous. . Raw or undercooked shellfish, such as oysters. . Sushi and sashimi, which often contain raw fish.  . Unpasteurized beverages, such as unpasteurized fruit  juices, raw milk, raw yogurt, or cider . Undercooked eggs, such as soft boiled, over easy, and poached; raw, unpasteurized eggs; or foods made with raw egg, such as homemade raw cookie dough and homemade mayonnaise  Simple steps for food safety  Shop smart. . Do not buy food stored or displayed in an unclean area. . Do not buy bruised or damaged fruits or vegetables. . Do not buy cans that have cracks, dents, or bulges. . Pick up foods that can spoil at the end of your shopping trip and store them in a cooler on the way home.  Prepare and clean up foods carefully. . Rinse all fresh fruits and vegetables under running water, and dry them with a clean towel or paper towel. . Clean the top of cans before opening them. . After preparing food, wash your hands for 20 seconds with hot water and soap. Pay special attention to areas between fingers and under nails. . Clean your utensils and dishes with hot water and soap. Marland Kitchen Disinfect your kitchen and cutting boards using 1 teaspoon of liquid, unscented bleach mixed into 1 quart of water.    Dispose of old food. . Eat canned and packaged food before its expiration date (the "use by" or "best before" date). . Consume refrigerated leftovers within 3 to 4 days. After that time, throw out the food.  Even if the food does not smell or look spoiled, it still may be unsafe. Some bacteria, such as Listeria, can grow even on foods stored in the refrigerator if they are kept for too long.  Take precautions when eating out. . At restaurants, avoid buffets and salad bars where food sits out for a long time and comes in contact with many people. Food can become contaminated when someone with a virus, often a norovirus, or another "bug" handles it. . Put any leftover food in a "to-go" container yourself, rather than having the server do it. And, refrigerate leftovers as soon as you get home. . Choose restaurants that are clean and that are willing to prepare your  food as you order it cooked.   AT HOME MEDICATIONS:                                                                                                                                                                Compazine/Prochlorperazine 10mg  tablet. Take 1 tablet every 6 hours as needed for nausea/vomiting. (This can make you sleepy)   EMLA cream. Apply a quarter size amount to port site 1 hour prior to chemo. Do not rub in. Cover with plastic wrap.    Diarrhea Sheet   If you are having loose stools/diarrhea, please purchase Imodium and begin taking as outlined:  At the first sign of poorly formed or loose stools you should begin taking Imodium (loperamide) 2 mg capsules.  Take two tablets (4mg ) followed by one tablet (2mg ) every 2 hours - DO NOT EXCEED 8 tablets in 24 hours.  If it is bedtime and you are having loose stools, take 2 tablets at bedtime, then 2 tablets every 4 hours until morning.   Always call the [facility] if you are having loose stools/diarrhea that you can't get under control.  Loose stools/diarrhea leads to dehydration (loss of water) in your body.  We have other options of trying to get the loose stools/diarrhea to stop but you must let us know!   Constipation Sheet  Colace - 100 mg capsules - take 2 capsules daily.  If this doesn't help then you can increase to 2 capsules twice daily.  Please call if the above does not work for you. Do not go more than 2 days without a bowel movement.  It is very important that you do not become constipated.  It will make you feel sick to your stomach (nausea) and can cause abdominal pain and vomiting.  Nausea Sheet   Compazine/Prochlorperazine 10mg  tablet. Take 1 tablet every 6 hours as needed for nausea/vomiting (This can make you drowsy).  If you are having persistent nausea (nausea that does not stop) please call the [facility] and let us know the amount of  nausea that you are experiencing.  If you begin to vomit, you  need to call the [facility] and if it is the weekend and you have vomited more than one time and can't get it to stop-go to the Emergency Room.  Persistent nausea/vomiting can lead to dehydration (loss of fluid in your body) and will make you feel very weak and unwell. Ice chips, sips of clear liquids, foods that are at room temperature, crackers, and toast tend to be better tolerated.   SYMPTOMS TO REPORT AS SOON AS POSSIBLE AFTER TREATMENT:  FEVER GREATER THAN 100.5 F  CHILLS WITH OR WITHOUT FEVER  NAUSEA AND VOMITING THAT IS NOT CONTROLLED WITH YOUR NAUSEA MEDICATION  UNUSUAL SHORTNESS OF BREATH  UNUSUAL BRUISING OR BLEEDING  TENDERNESS IN MOUTH AND THROAT WITH OR WITHOUT   PRESENCE OF ULCERS  URINARY PROBLEMS  BOWEL PROBLEMS  UNUSUAL RASH      Wear comfortable clothing and clothing appropriate for easy access to any Portacath or PICC line. Let us know if there is anything that we can do to make your therapy better!    What to do if you need assistance after hours or on the weekends: CALL [phone].  HOLD on the line, do not hang up.  You will hear multiple messages but at the end you will be connected with a nurse triage line.  They will contact the doctor if necessary.  Most of the time they will be able to assist you.  Do not call the hospital operator.      I have been informed and understand all of the instructions given to me and have received a copy. I have been instructed to call the clinic [phone] or my family physician as soon as possible for continued medical care, if indicated. I do not have any more questions at this time but understand that I may call the [facility] or the Patient Navigator at [phone] during office hours should I have questions or need assistance in obtaining follow-up care.

## 2019-02-18 NOTE — Anesthesia Procedure Notes (Addendum)
Procedure Name: Intubation Date/Time: 02/18/2019 7:38 AM Performed by: Jenne Campus, CRNA Pre-anesthesia Checklist: Patient identified, Emergency Drugs available, Suction available and Patient being monitored Patient Re-evaluated:Patient Re-evaluated prior to induction Oxygen Delivery Method: Circle System Utilized Preoxygenation: Pre-oxygenation with 100% oxygen Induction Type: IV induction Ventilation: Mask ventilation without difficulty and Oral airway inserted - appropriate to patient size Laryngoscope Size: Glidescope and 4 Grade View: Grade II Tube type: Oral Nasal Tubes: Nasal prep performed Tube size: 6.5 mm Number of attempts: 1 Airway Equipment and Method: Stylet,  Oral airway and Video-laryngoscopy Placement Confirmation: ETT inserted through vocal cords under direct vision,  positive ETCO2 and breath sounds checked- equal and bilateral Secured at: 22 cm Tube secured with: Tape Dental Injury: Teeth and Oropharynx as per pre-operative assessment  Difficulty Due To: Difficulty was anticipated, Difficult Airway- due to dentition and Difficult Airway-  due to edematous airway Comments: Smooth IV induction. Easy mask with 56mm OA. DL x 1 video-glide. Grade 2 view. Airway structures swollen. Only visible vocal cord is a portion of the left cord. Unable to pass 7.5 ETT into trachea. 7.0 tried, then successful with 6.5 by Dr Doroteo Glassman. Airway bloody. Plan for 10mg  decadron per Dr. Doroteo Glassman.

## 2019-02-19 ENCOUNTER — Other Ambulatory Visit (HOSPITAL_COMMUNITY): Payer: Self-pay | Admitting: *Deleted

## 2019-02-19 ENCOUNTER — Telehealth: Payer: Self-pay | Admitting: *Deleted

## 2019-02-19 ENCOUNTER — Inpatient Hospital Stay (HOSPITAL_COMMUNITY): Payer: 59

## 2019-02-19 ENCOUNTER — Other Ambulatory Visit (HOSPITAL_COMMUNITY)
Admission: RE | Admit: 2019-02-19 | Discharge: 2019-02-19 | Disposition: A | Discharge status: Home / Self Care | Payer: 59 | Source: Ambulatory Visit | Attending: General Surgery | Admitting: General Surgery

## 2019-02-19 DIAGNOSIS — C329 Malignant neoplasm of larynx, unspecified: Secondary | ICD-10-CM

## 2019-02-19 DIAGNOSIS — K0820 Unspecified atrophy of edentulous alveolar ridge: Secondary | ICD-10-CM

## 2019-02-19 DIAGNOSIS — K08109 Complete loss of teeth, unspecified cause, unspecified class: Secondary | ICD-10-CM

## 2019-02-19 DIAGNOSIS — C321 Malignant neoplasm of supraglottis: Principal | ICD-10-CM

## 2019-02-19 DIAGNOSIS — K08199 Complete loss of teeth due to other specified cause, unspecified class: Secondary | ICD-10-CM

## 2019-02-19 LAB — BASIC METABOLIC PANEL
Anion gap: 13 (ref 5–15)
BUN: 9 mg/dL (ref 6–20)
CO2: 24 mmol/L (ref 22–32)
Calcium: 9.3 mg/dL (ref 8.9–10.3)
Chloride: 97 mmol/L — ABNORMAL LOW (ref 98–111)
Creatinine, Ser: 0.97 mg/dL (ref 0.61–1.24)
GFR calc Af Amer: >60 mL/min (ref >60)
GFR calc non Af Amer: >60 mL/min (ref >60)
Glucose, Bld: 110 mg/dL — ABNORMAL HIGH (ref 70–99)
Potassium: 3.6 mmol/L (ref 3.5–5.1)
Sodium: 134 mmol/L — ABNORMAL LOW (ref 135–145)

## 2019-02-19 LAB — CBC
HCT: 33.3 % — ABNORMAL LOW (ref 39.0–52.0)
Hemoglobin: 11.5 g/dL — ABNORMAL LOW (ref 13.0–17.0)
MCH: 32.5 pg (ref 26.0–34.0)
MCHC: 34.5 g/dL (ref 30.0–36.0)
MCV: 94.1 fL (ref 80.0–100.0)
Platelets: 262 10*3/uL (ref 150–400)
RBC: 3.54 MIL/uL — ABNORMAL LOW (ref 4.22–5.81)
RDW: 13.2 % (ref 11.5–15.5)
WBC: 12.1 10*3/uL — ABNORMAL HIGH (ref 4.0–10.5)
nRBC: 0 % (ref 0.0–0.2)

## 2019-02-19 MED ORDER — CEFAZOLIN SODIUM-DEXTROSE 2-4 GM/100ML-% IV SOLN
2.0000 g | Freq: Three times a day (TID) | INTRAVENOUS | Status: DC
  Administered 2019-02-19 – 2019-02-21 (×7): 2 g via INTRAVENOUS
  Filled 2019-02-19 (×10): qty 100

## 2019-02-19 MED ORDER — WHITE PETROLATUM EX OINT
TOPICAL_OINTMENT | CUTANEOUS | Status: AC
  Administered 2019-02-19: 0.2
  Filled 2019-02-19: qty 28.35

## 2019-02-19 MED ORDER — CEFAZOLIN SODIUM-DEXTROSE 2-4 GM/100ML-% IV SOLN
2.0000 g | Freq: Three times a day (TID) | INTRAVENOUS | Status: DC
  Filled 2019-02-19: qty 100

## 2019-02-19 MED ORDER — AMINOCAPROIC ACID SOLUTION 5% (50 MG/ML)
10.0000 mL | ORAL | Status: AC
  Administered 2019-02-19 (×9): 10 mL via ORAL
  Filled 2019-02-19: qty 100

## 2019-02-19 MED ORDER — ORAL CARE MOUTH RINSE
15.0000 mL | Freq: Two times a day (BID) | OROMUCOSAL | Status: DC
  Administered 2019-02-19 – 2019-02-20 (×3): 15 mL via OROMUCOSAL

## 2019-02-19 MED ORDER — CHLORHEXIDINE GLUCONATE 0.12 % MT SOLN
15.0000 mL | Freq: Two times a day (BID) | OROMUCOSAL | Status: DC
  Administered 2019-02-19 – 2019-02-25 (×13): 15 mL via OROMUCOSAL
  Filled 2019-02-19 (×3): qty 15

## 2019-02-19 NOTE — Progress Notes (Signed)
POST OPERATIVE NOTE:  02/19/2019 [name] [id number]  VITALS: BP (!) 164/104   Pulse 67   Temp 98.7 F (37.1 C) (Axillary)   Resp 18   Ht 5\' 8"  (1.727 m)   Wt 81 kg   SpO2 100%   BMI 27.15 kg/m   LABS:  Lab Results  Component Value Date   WBC 12.1 (H) 02/19/2019   HGB 11.5 (L) 02/19/2019   HCT 33.3 (L) 02/19/2019   MCV 94.1 02/19/2019   PLT 262 02/19/2019   BMET    Component Value Date/Time   NA 134 (L) 02/19/2019 0419   K 3.6 02/19/2019 0419   CL 97 (L) 02/19/2019 0419   CO2 24 02/19/2019 0419   GLUCOSE 110 (H) 02/19/2019 0419   BUN 9 02/19/2019 0419   BUN 13 12/24/2018 0918   CREATININE 0.97 02/19/2019 0419   CREATININE 1.23 10/28/2012 1558   CALCIUM 9.3 02/19/2019 0419   GFRNONAA >60 02/19/2019 0419   GFRAA >60 02/19/2019 0419    Lab Results  Component Value Date   INR 1.0 08/02/2018   INR 0.99 11/05/2012   INR 0.98 10/28/2012   No results found for: PTT   [name] is status post extraction of remaining teeth with the alveoloplasty and pre-prosthetic surgery followed by tracheostomy procedure by Dr. Benjamine Mola.  SUBJECTIVE: Patient with some discomfort from dental extractions. Patient denies having any active bleeding.  Patient states that he is able to eat.  OBJECTIVE:  There is no sign of heme or oozing. Sutures ae intact. Clots present. Extraoral and intraoral swelling is noted. Intraoral bruising is noted. Elevated white count noted.  ASSESSMENT: Post operative course is consistent with dental procedures performed in the operating room. Patient required tracheostomy by Dr. Benjamine Mola due to difficult intubation and progression of tumor size. Elevated white count Loss of teeth due to extraction. Patient is now edentulous.  PLAN: After discussion with Dr. Benjamine Mola re: elevated white count, plan was to start with Ancef IV every 8 hours with pharmacy to dose. (2 g IV q 8 H)  Use CHG rinses BID. Advance diet as tolerated. Request Nutritional  consult Plan for placement of port-a-cath and possible feeding tube this admission if possible. Head and neck navigator to help coordinate.   [name], [name]

## 2019-02-19 NOTE — Progress Notes (Signed)
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  During induction airway was quite difficult with friable tissue requiring several attempts before airway cannulated.  Dental extraction was completed.  It was decided to proceed with tracheostomy for airway protection given concern for airway obstruction.   History of present illness   59 year old male patient who presented to [name] for elective multiple dental extractions prior to initiating chemo and radiation therapy to newly diagnosed supraglottic posterior laryngeal squamous cell carcinoma on 2/16.  During anesthesia and induction was noted to have very difficult airway with friable tissue requiring multiple attempts before a size 6 endotracheal tube was successfully placed.  There is significant bleeding and trauma, raising concern for airway protection.  His ENT was consulted intraoperatively, and after successful dental extractions it was decided to proceed with tracheostomy for airway protection.  A size 6 cuffed tracheostomy was successfully placed, he was weaned from mechanical ventilation in the recovery room, and critical care was asked to admit.  Past Medical History  New diagnosis of laryngeal carcinoma with squamous cell.  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  Significant Hospital Events   2/16: Presented for dental extraction, also requiring tracheostomy for upper airway  obstruction  Consults:  ENT, consulted intraoperatively.  Dr. Benjamine Mola  Procedures:  Multiple dental extractions and tracheostomy 2/16  Significant Diagnostic Tests:    Micro Data:    Antimicrobials:  Intraoperative Ancef x1  Interim history/subjective:  Comfortable, no complaints.  Objective   Blood pressure (!) 154/95, pulse 89, temperature 98.7 F (37.1 C), temperature source Axillary, resp. rate 11, height 5\' 8"  (1.727 m), weight 81 kg, SpO2 98 %.    FiO2 (%):  [28 %] 28 %   Intake/Output Summary (Last 24 hours) at 02/19/2019 0848 Last data filed at 02/19/2019 0600 Gross per 24 hour  Intake 1539.45 ml  Output 2075 ml  Net -535.55 ml   Filed Weights   02/18/19 0606 02/18/19 1521 02/19/19 0500  Weight: 81.6 kg 82.1 kg 81 kg    Examination: General: Adult male, resting in bed, in NAD. Neuro: A&O x 3, no deficits. HEENT: 6 cuffed trach in place.  Some mild submandibular edema. Sclerae anicteric. EOMI. Cardiovascular: RRR, no M/R/G.  Lungs: Respirations even and unlabored.  CTA bilaterally, No W/R/R. Abdomen: BS x 4, soft, NT/ND.  Musculoskeletal: No gross deformities, no edema.  Skin: Intact, warm, no rashes.  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  Per handoff the plan was to keep this off indefinitely  Squamous cell head neck cancer Plan Will eventually need Port-A-Cath insertion.  Will need to clear this with dental surgical team, want to be sure no seeding of catheter post obstruction  Multiple dental extractions with 4 quadrant alveoloplasty, maxillary left  buccal exostoses reductions. Continue post op  amicar swish and swallow x 10 hours. Post op care per [name].  Hypertension Plan Monitor, will add as needed therapy as we are holding his home medications  History of GERD Plan PPI  Best practice:  Diet: N.p.o. Pain/Anxiety/Delirium protocol (if indicated): Not indicated, he is getting as needed morphine VAP protocol (if indicated): Not indicated DVT prophylaxis: SCD GI prophylaxis: PPI Glucose control: Not indicated Mobility: Out of bed as tolerated Code Status: Full code Family Communication: Wife updated by surgical team Disposition: Transfer to progressive.  Will ask TRH to assume care in AM 2/18 and PCCM off.  Montey Hora, [place] [facility] 02/19/2019, 8:55 AM

## 2019-02-19 NOTE — Progress Notes (Signed)
Pharmacy Antibiotic Note  [name] is a 59 y.o. male admitted on 02/18/2019 with surgical prophylaxis s/p dental extraction 2/16 with WBC elevation.  Pharmacy has been consulted for cefazolin dosing.  No signs of systemic infection other than WBC elevation (may be due to dental extractions). Discussed with CCM and ordered blood cultures before antibiotics.   Plan: Start cefazolin 2g Q8hr after blood cultures drawn Monitor cultures, clinical status, renal fx Narrow abx as able and f/u duration   Height: 5\' 8"  (172.7 cm) Weight: 178 lb 9.2 oz (81 kg) IBW/kg (Calculated) : 68.4  Temp (24hrs), Avg:98.3 F (36.8 C), Min:97 F (36.1 C), Max:99 F (37.2 C)  Recent Labs  Lab 02/17/19 1419 02/19/19 0419  WBC 7.5 12.1*  CREATININE 0.99 0.97    Estimated Creatinine Clearance: 81.3 mL/min (by C-G formula based on SCr of 0.97 mg/dL).    Antimicrobials this admission: Cefaz 2/17 >>    Microbiology results: 2/17 Bcx pend  2/16 MRSA PCR: neg  Benetta Spar, PharmD, BCPS, [facility] Clinical Pharmacist  Please check AMION for all [place] phone numbers After 10:00 PM, call [place] [phone]

## 2019-02-19 NOTE — Evaluation (Cosign Needed)
Clinical/Bedside Swallow Evaluation Patient Details  Name: [name] MRN: [id number] Date of Birth: 1960/02/23  Today's Date: 02/19/2019 Time: SLP Start Time (ACUTE ONLY): 0930 SLP Stop Time (ACUTE ONLY): 1002 SLP Time Calculation (min) (ACUTE ONLY): 32 min  Past Medical History:  Past Medical History:  Diagnosis Date  . Anxiety   . Arthritis    back  . Bradycardia    a. During 11/2012 adm: lopressor decreased.  . Cancer of larynx ([place])   . Carotid artery stenosis   . 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 Coronary artery disease    a. Diag cath 10/2012 for CP/abnormal nuc -> planned PCI s/p LAD atherectomy/DES placement 11/05/12.  Marland Kitchen GERD (gastroesophageal reflux disease)   . History of hiatal hernia   . Hypercholesteremia   . Hypertension   . Stroke ([place]) 07/31/2018   bilateral vertebrobasilar occlusion; "left side weaker thanit was before."   Past Surgical History:  Past Surgical History:  Procedure Laterality Date  . CARDIAC CATHETERIZATION  10/29/2012  . CORONARY ANGIOPLASTY WITH STENT PLACEMENT  11/05/2012  . DIRECT LARYNGOSCOPY N/A 01/02/2019   Procedure: MICRO DIRECT LARYNGOSCOPY BIOPSY OF LARYNGEAL MASS;  Surgeon: [name], MD;  Location: [facility];  Service: ENT;  Laterality: N/A;  . HEMORRHOID SURGERY  ~ 2010  . INSERTION OF MESH N/A 06/02/2015   Procedure: INSERTION OF MESH;  Surgeon: [name], MD;  Location: [facility];  Service: General;  Laterality: N/A;  . IR ANGIO INTRA EXTRACRAN SEL COM CAROTID INNOMINATE BILAT MOD SED  08/02/2018  . IR ANGIO VERTEBRAL SEL VERTEBRAL BILAT MOD SED  08/02/2018  . LEFT HEART CATHETERIZATION WITH CORONARY ANGIOGRAM N/A 10/30/2012   Procedure: LEFT HEART CATHETERIZATION WITH CORONARY ANGIOGRAM;  Surgeon: [name], MD;  Location: [facility] CATH LAB;  Service: Cardiovascular;  Laterality: N/A;  . MULTIPLE EXTRACTIONS WITH ALVEOLOPLASTY N/A 02/18/2019   Procedure: Extraction of tooth #'s 5-7, 10,11,14,20-29, 31 and 32 with alveoloplasty and maxiillary left buccal exostoses reductions;  Surgeon: [name], [name];  Location: [facility];  Service: Oral Surgery;  Laterality: N/A;  . PERCUTANEOUS CORONARY ROTOBLATOR INTERVENTION (PCI-R) N/A 11/05/2012   Procedure: PERCUTANEOUS CORONARY ROTOBLATOR INTERVENTION (PCI-R);  Surgeon: [name], MD;  Location: [facility] CATH LAB;  Service: Cardiovascular;  Laterality: N/A;  . TRACHEOSTOMY TUBE PLACEMENT N/A 02/18/2019   Procedure: Tracheostomy;  Surgeon: [name], MD;  Location: [facility];  Service: ENT;  Laterality: N/A;  . UMBILICAL HERNIA REPAIR N/A 06/02/2015   Procedure: UMBILICAL HERNIORRHAPHY WITH MESH;  Surgeon: [name], MD;  Location: [facility];  Service: General;  Laterality: N/A;   HPI:  59 year old male patient who presented to [name] for elective multiple dental extractions prior to initiating chemo and radiation therapy to newly diagnosed supraglottic posterior laryngeal squamous cell carcinoma on 2/16. Hx of Anxiety,  During induction airway was quite difficult with friable tissue requiring several attempts before airway cannulated.  Dental extraction was completed.  It was decided to proceed with tracheostomy for airway protection given concern for airway obstruction. Had outpatient BSE on 02/05/19, recommended thin liquids and regular diet.   Assessment / Plan / Recommendation Clinical Impression   Pt seen for BSE with PMV placement during session. Pt observed with thin liquids via spoon. Following initial trials, he was noted with immediate throat clearing. SLP initially spoon fed pt at ~60 degree positioning in bed, but  was repositioned to 90 degrees and began self feeding liquids via spoon. He stated he preferred spoon vs cup sips d/t numbness in lips. Once repositioned and began self feeding, he was observed with x1 delayed cough, but no other significant s/sx of aspiration.  Recommend pt may  have small sips of water with PMV placement and close supervision via staff. Will f/u for potential to advance as pain and numbness decrease. Pt may benefit for MBS while in hospital, at least to establish baseline prior to treatment.   SLP Visit Diagnosis: Aphonia (R49.1)    Aspiration Risk  Mild aspiration risk;Moderate aspiration risk    Diet Recommendation Other (Comment)(small sips of water PRN)   Liquid Administration via: Cup;Spoon Medication Administration: Via alternative means Supervision: Patient able to self feed Compensations: Small sips/bites Postural Changes: Seated upright at 90 degrees    Other  Recommendations Oral Care Recommendations: Oral care BID Other Recommendations: Place PMSV during PO intake;Have oral suction available   Follow up Recommendations Outpatient SLP;Home health SLP      Frequency and Duration min 2x/week  2 weeks       Prognosis Prognosis for Safe Diet Advancement: Good      Swallow Study   General Date of Onset: 02/18/19 HPI: 59 year old male patient who presented to [name] for elective multiple dental extractions prior to initiating chemo and radiation therapy to newly diagnosed supraglottic posterior laryngeal squamous cell carcinoma on 2/16. Hx of Anxiety,  During induction airway was quite difficult with friable tissue requiring several attempts before airway cannulated.  Dental extraction was completed.  It was decided to proceed with tracheostomy for airway protection given concern for airway obstruction. Had outpatient BSE on 02/05/19, recommended thin liquids and regular diet.  Type of Study: Bedside Swallow Evaluation Previous Swallow Assessment: see HPI Diet Prior to this Study: Regular;Thin liquids Temperature Spikes Noted: No Respiratory Status: Room air History of Recent Intubation: No Behavior/Cognition: Alert;Cooperative;Pleasant mood Oral Cavity Assessment: Other (comment)(bloody) Oral Care Completed by SLP: Recent  completion by staff Oral Cavity - Dentition: Edentulous Vision: Functional for self-feeding Self-Feeding Abilities: Able to feed self Patient Positioning: Upright in bed Baseline Vocal Quality: Other (comment)(dysphonic) Volitional Cough: Strong Volitional Swallow: Able to elicit    Oral/Motor/Sensory Function Overall Oral Motor/Sensory Function: Within functional limits   Ice Chips Ice chips: Not tested   Thin Liquid Thin Liquid: Impaired Presentation: Spoon;Self Fed Pharyngeal  Phase Impairments: Throat Clearing - Immediate;Throat Clearing - Delayed    Nectar Thick Nectar Thick Liquid: Not tested   Honey Thick Honey Thick Liquid: Not tested   Puree Puree: Not tested   Solid     Solid: Not tested     Aline August, Student SLP Office: [phone]  02/19/2019,10:37 AM

## 2019-02-19 NOTE — Evaluation (Signed)
Passy-Muir Speaking Valve - Evaluation Patient Details  Name: [name] MRN: [id number] Date of Birth: 26-Feb-1960  Today's Date: 02/19/2019 Time: 0930-1002 SLP Time Calculation (min) (ACUTE ONLY): 32 min  Past Medical History:  Past Medical History:  Diagnosis Date  . Anxiety   . Arthritis    back  . Bradycardia    a. During 11/2012 adm: lopressor decreased.  . Cancer of larynx ([place])   . Carotid artery stenosis   . 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 Coronary artery disease    a. Diag cath 10/2012 for CP/abnormal nuc -> planned PCI s/p LAD atherectomy/DES placement 11/05/12.  Marland Kitchen GERD (gastroesophageal reflux disease)   . History of hiatal hernia   . Hypercholesteremia   . Hypertension   . Stroke ([place]) 07/31/2018   bilateral vertebrobasilar occlusion; "left side weaker thanit was before."   Past Surgical History:  Past Surgical History:  Procedure Laterality Date  . CARDIAC CATHETERIZATION  10/29/2012  . CORONARY ANGIOPLASTY WITH STENT PLACEMENT  11/05/2012  . DIRECT LARYNGOSCOPY N/A 01/02/2019   Procedure: MICRO DIRECT LARYNGOSCOPY BIOPSY OF LARYNGEAL MASS;  Surgeon: [name], MD;  Location: [facility] OR;  Service: ENT;  Laterality: N/A;  . HEMORRHOID SURGERY  ~ 2010  . INSERTION OF MESH N/A 06/02/2015   Procedure: INSERTION OF MESH;  Surgeon: [name], MD;  [facility];  Service: General;  Laterality: N/A;  . IR ANGIO INTRA EXTRACRAN SEL COM CAROTID INNOMINATE BILAT MOD SED  08/02/2018  . IR ANGIO VERTEBRAL SEL VERTEBRAL BILAT MOD SED  08/02/2018  . LEFT HEART CATHETERIZATION WITH CORONARY ANGIOGRAM N/A 10/30/2012   Procedure: LEFT HEART CATHETERIZATION WITH CORONARY ANGIOGRAM;  Surgeon: [name], MD;  Location: [facility] CATH LAB;  Service: Cardiovascular;  Laterality: N/A;  . MULTIPLE EXTRACTIONS WITH ALVEOLOPLASTY N/A 02/18/2019   Procedure: Extraction of tooth #'s 5-7, 10,11,14,20-29, 31  and 32 with alveoloplasty and maxiillary left buccal exostoses reductions;  Surgeon: [name], [name];  Location: [facility];  Service: Oral Surgery;  Laterality: N/A;  . PERCUTANEOUS CORONARY ROTOBLATOR INTERVENTION (PCI-R) N/A 11/05/2012   Procedure: PERCUTANEOUS CORONARY ROTOBLATOR INTERVENTION (PCI-R);  Surgeon: [name], MD;  Location: [facility] CATH LAB;  Service: Cardiovascular;  Laterality: N/A;  . TRACHEOSTOMY TUBE PLACEMENT N/A 02/18/2019   Procedure: Tracheostomy;  Surgeon: [name], MD;  Location: [facility];  Service: ENT;  Laterality: N/A;  . UMBILICAL HERNIA REPAIR N/A 06/02/2015   Procedure: UMBILICAL HERNIORRHAPHY WITH MESH;  Surgeon: [name], MD;  [facility];  Service: General;  Laterality: N/A;   HPI:  59 year old male patient who presented to [name] for elective multiple dental extractions prior to initiating chemo and radiation therapy to newly diagnosed supraglottic posterior laryngeal squamous cell carcinoma on 2/16. Hx of Anxiety,  During induction airway was quite difficult with friable tissue requiring several attempts before airway cannulated.  Dental extraction was completed.  It was decided to proceed with tracheostomy for airway protection given concern for airway obstruction.    Assessment / Plan / Recommendation Clinical Impression   Pt encountered in bed, alert/cooperative t/o session. Pt reported he did have some pain in/around his mouth and throat prior to beginning session. He was able to tolerate cuff deflation for the entire session, ~25-30 minutes. PMV was successfully placed for ~20 minutes, with some initial coughing but was able to produce oral secretions. Pt's vitals  remained stable (HR 82, Sp02 97, RR 14). He was able to phonate, was ~75-100% intelligible, and stated he had no significant discomfort with initial placement. The pt also stated that his voice was similar to his baseline, although mildly dysphonic. Around the 20 minute mark, pt reported  he was having some discomfort in his throat, but was unsure if it was directly related to [place] or recent trach placement/edema. Recommend he wear PMV with staff with close supervision, and while drinking small sips of water. Will continue to follow, and provide education regarding PMV prior to discharge.   SLP Visit Diagnosis: Aphonia (R49.1)    SLP Assessment  Patient needs continued Speech Lanaguage Pathology Services    Follow Up Recommendations  Outpatient SLP;Home health SLP    Frequency and Duration min 2x/week  2 weeks    PMSV Trial PMSV was placed for: 20 minutes Able to redirect subglottic air through upper airway: Yes Able to Attain Phonation: Yes Voice Quality: Normal Able to Expectorate Secretions: Yes Level of Secretion Expectoration with PMSV: Oral Breath Support for Phonation: Adequate Intelligibility: Intelligible Respirations During Trial: 14 SpO2 During Trial: 97 % Pulse During Trial: 82 Behavior: Alert;Cooperative;Responsive to questions   Tracheostomy Tube       Vent Dependency  FiO2 (%): 28 %    Cuff Deflation Trial  GO   Aline August, Student SLP Office: [phone]  Tolerated Cuff Deflation: Yes Length of Time for Cuff Deflation Trial: 20 minutes Behavior: Alert;Cooperative        02/19/2019, 10:13 AM

## 2019-02-19 NOTE — Telephone Encounter (Signed)
Oncology Nurse Navigator Documentation  In follow-up to ENT Conference discussion, spoke with [name] wife, [name], who was bedside s/p his tracheostomy yesterday following extractions. Reiterated strong recommendation he reconsider his previously expressed refusals to have PEG placed prior to starting chemoRT.  Explained:  Importance of a PEG to ensure he is able to maintain nutritional/hydration requirements during and for immediate months after completion of treatments.    Better to have PEG placed now rather than later when swallowing becomes problematic.    He can continue oral intake after PEG is placed, will use it PRN as directed by Nutritionist who will be supporting him while he is receiving tmt.  PEG can be placed during current admission in conjunction with port, noted Dr. Marisue Humble place order if he agrees to placement. I encouraged her to talk with her husband, call me with his decision ASAP.  She agreed to plan.  1500:   She called, indicated he is agreeable to PEG.  His previous refusal was based on concern he could no longer eat/drink. I updated Drs. Gennette Pac via IB.  [name], RN, BSN Head & Neck Oncology Nurse [place] [facility] [place] [phone]

## 2019-02-19 NOTE — Progress Notes (Signed)
Subjective: Pt alert and awake. On trach collar.  Objective: Vital signs in last 24 hours: Temp:  [97 F (36.1 C)-99 F (37.2 C)] 98.6 F (37 C) (02/17 0400) Pulse Rate:  [64-105] 89 (02/17 0755) Resp:  [9-33] 11 (02/17 0755) BP: (110-205)/(72-109) 154/95 (02/17 0755) SpO2:  [95 %-100 %] 98 % (02/17 0755) FiO2 (%):  [28 %] 28 % (02/17 0755) Weight:  [81 kg-82.1 kg] 81 kg (02/17 0500)  General: Alert and responsive. NAD. Head: Normocephalic, no evidence injury, no tenderness, facial buttresses intact without stepoff.  Eyes: PERRL, EOMI.  No scleral icterus, conjunctivae clear.  Nose: Normal skin and external support.  Anterior rhinoscopy reveals healthy pink mucosa over the septum and turbinates.  Oral cavity: s/p dental extractions. Significant facial edema. Neck: Trach in place. Breathing well via trach.  Recent Labs    02/17/19 1419 02/19/19 0419  WBC 7.5 12.1*  HGB 11.9* 11.5*  HCT 35.2* 33.3*  PLT 269 262   Recent Labs    02/17/19 1419 02/19/19 0419  NA 132* 134*  K 4.5 3.6  CL 98 97*  CO2 23 24  GLUCOSE 101* 110*  BUN 11 9  CREATININE 0.99 0.97  CALCIUM 9.7 9.3    Medications:  I have reviewed the patient's current medications. Scheduled: . Chlorhexidine Gluconate Cloth  6 each Topical Q0600  .  HYDROmorphone (DILAUDID) injection  0.5 mg Intravenous Once  . pantoprazole (PROTONIX) IV  40 mg Intravenous QHS   Continuous: . lactated ringers    . lactated ringers 75 mL/hr at 02/19/19 0600    Assessment/Plan: POD #1 s/p trach and dental extractions. - On trach collar. - Trach care teaching in anticipation of discharge to home. - ChemoXRT per medical and radiation oncology.   LOS: 1 day   Jonise Weightman W Elisa Kutner 02/19/2019, 7:57 AM

## 2019-02-20 ENCOUNTER — Inpatient Hospital Stay (HOSPITAL_COMMUNITY): Payer: 59

## 2019-02-20 ENCOUNTER — Other Ambulatory Visit (HOSPITAL_COMMUNITY): Payer: Self-pay | Admitting: *Deleted

## 2019-02-20 ENCOUNTER — Encounter (HOSPITAL_COMMUNITY): Payer: Self-pay | Admitting: *Deleted

## 2019-02-20 DIAGNOSIS — Z9889 Other specified postprocedural states: Secondary | ICD-10-CM

## 2019-02-20 DIAGNOSIS — M545 Low back pain: Secondary | ICD-10-CM

## 2019-02-20 DIAGNOSIS — C321 Malignant neoplasm of supraglottis: Secondary | ICD-10-CM

## 2019-02-20 DIAGNOSIS — C760 Malignant neoplasm of head, face and neck: Secondary | ICD-10-CM

## 2019-02-20 DIAGNOSIS — I10 Essential (primary) hypertension: Secondary | ICD-10-CM

## 2019-02-20 DIAGNOSIS — G8929 Other chronic pain: Secondary | ICD-10-CM

## 2019-02-20 LAB — CBC
HCT: 34 % — ABNORMAL LOW (ref 39.0–52.0)
Hemoglobin: 11.2 g/dL — ABNORMAL LOW (ref 13.0–17.0)
MCH: 32.2 pg (ref 26.0–34.0)
MCHC: 32.9 g/dL (ref 30.0–36.0)
MCV: 97.7 fL (ref 80.0–100.0)
Platelets: 258 K/uL (ref 150–400)
RBC: 3.48 MIL/uL — ABNORMAL LOW (ref 4.22–5.81)
RDW: 13.2 % (ref 11.5–15.5)
WBC: 10.3 K/uL (ref 4.0–10.5)
nRBC: 0 % (ref 0.0–0.2)

## 2019-02-20 LAB — BASIC METABOLIC PANEL
Anion gap: 11 (ref 5–15)
BUN: 12 mg/dL (ref 6–20)
CO2: 26 mmol/L (ref 22–32)
Calcium: 9 mg/dL (ref 8.9–10.3)
Chloride: 98 mmol/L (ref 98–111)
Creatinine, Ser: 1.03 mg/dL (ref 0.61–1.24)
GFR calc Af Amer: >60 mL/min (ref >60)
GFR calc non Af Amer: >60 mL/min (ref >60)
Glucose, Bld: 104 mg/dL — ABNORMAL HIGH (ref 70–99)
Potassium: 3.6 mmol/L (ref 3.5–5.1)
Sodium: 135 mmol/L (ref 135–145)

## 2019-02-20 NOTE — Progress Notes (Signed)
Patient remains inpatient at [facility].  His orders for port and peg placement were inadvertently cancelled due to questionable patient discharge.  I have called interventional radiology and asked that they place Port and PEG per Dr. Delton Coombes. They placed new orders for Dr. Delton Coombes to [facility] and they are going to send a PA to go evaluate patient today for procedures.  I have updated Dr. Delton Coombes, Dr. Arnoldo Morale and Head and Neck Navigator at [facility].

## 2019-02-20 NOTE — Progress Notes (Signed)
  Speech Language Pathology Treatment: Dysphagia;Passy Muir Speaking valve  [name] Details Name: [name] MRN: [id number] DOB: Sep 03, 1960 Today's Date: 02/20/2019 Time: 1510-1600 SLP Time Calculation (min) (ACUTE ONLY): 50 min  Assessment / Plan / Recommendation Clinical Impression  Pt seen at bedside for follow up after PMSV evaluation and po trials, after PMSV eval and BSE completed yesterday.   PMSV:  RN suctioned pt prior to valve placement. Cuff was deflated upon arrival of SLP. Speaking valve was placed following suctioning with tether attached. Pt tolerated valve placement for over 45 minutes with steady HR and O2 sats. Strong voice noted. SLP provided education regarding breathing with trach/PMSV in place. Pt exhibited cough x1, strong enough to cough valve off. Tracheal secretions were thick and tan. SLP removed secretions and replaced valve. PMSV was left on pt at session's end.  Dysphagia: Pt completed oral care after set up. He reports the pain and swelling are lessened today, but he is still having difficulty s/p extraction. Pt tolerated trials of ice chips, water, and ice cream. Cough response noted x1 at the beginning of po trials. No cough elicited again. Recommend providing ice chips, water, and puree from floor stock intermittently as pt tolerates, based on oral pain and swelling. Recommend completion of objective study prior to liberalization of diet, to establish baseline prior to chemo and radiation treatments.  SLP will continue to follow and proceed with MBS as able. Pt has port a cath placement scheduled for tomorrow.    HPI HPI: [name] who presented to [name] for elective multiple dental extractions prior to initiating chemo and radiation therapy to newly diagnosed supraglottic posterior laryngeal squamous cell carcinoma on 2/16. Hx of Anxiety,  During induction airway was quite difficult with friable tissue requiring several attempts before  airway cannulated.  Dental extraction was completed.  It was decided to proceed with tracheostomy for airway protection given concern for airway obstruction. Had outpatient BSE on 02/05/19, recommended thin liquids and regular diet.       SLP Plan  Continue with current plan of care       Recommendations  Diet recommendations: (sips of water, puree, ice cream PRN) Medication Administration: Via alternative means Supervision: [name] able to self feed Compensations: Small sips/bites;Slow rate;Minimize environmental distractions      [name] may use Passy-Muir Speech Valve: Intermittently with supervision MD: Please consider changing trach tube to : Smaller size;Cuffless         Oral Care Recommendations: Oral care BID Follow up Recommendations: Outpatient SLP;Home health SLP SLP Visit Diagnosis: Aphonia (R49.1);Dysphagia, unspecified (R13.10) Plan: Continue with current plan of care       [place]. [name], [facility], [place] Speech Language Pathologist Office: [phone] Pager: [phone]   Shonna Chock 02/20/2019, 4:01 PM

## 2019-02-20 NOTE — Progress Notes (Signed)
Patient reports last time he took the medication plavix was 02/13/2019

## 2019-02-20 NOTE — Progress Notes (Signed)
error 

## 2019-02-20 NOTE — Plan of Care (Signed)

## 2019-02-20 NOTE — Progress Notes (Signed)
PROGRESS NOTE    [name]    Code Status: Full Code  [name]:[id number] DOB: 09-15-1960 DOA: 02/18/2019 LOS: 2 days  PCP: [name], MD CC: No chief complaint on file.      Hospital Summary  [name] is a 59 y.o. 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  Due to significant bleeding and trauma there was concern for airway protection and ENT was consulted who decided to proceed with cuffed tracheostomy after his dental procedure and in ICU for observation.  He was transferred out of the ICU in [facility] assumed care on 2/18.  He was since evaluated by SLP.  IR commended general surgery consult for PEG tube insertion and IR to place Port-A-Cath in anticipation of chemotherapy on 2/19.  Due to leukocytosis he was started on Ancef per dental and ENT team  A & P   Active Problems:   Malignant neoplasm of supraglottis ([place])   Hx of tracheostomy   Head and neck cancer ([place])   1. Newly diagnosed supraglottic posterior laryngeal squamous cell carcinoma complicated by traumatic intubation during dental procedure, s/p tracheostomy placement, POD 2 a. Port-A-Cath placement by IR on a.m. for anticipation of chemotherapy b. IR recommended to consult general surgery for PEG tube placement c. ENT and dentistry on board, on Ancef for leukocytosis which has resolved d. Morphine as needed e. Follow-up SLP eval 2. Hypertension a. Off p.o. antihypertensives b. Continue as needed hydralazine 3. Low back pain a. Heating pad 4. CAD a. Holding statin and antiplatelets due to  n.p.o. and upcoming procedure    DVT prophylaxis: SCDs Family Communication: Patient's wife was called but did not answer  Disposition Plan:   Patient came from:  home                                                                                          Anticipated d/c place: hom  Barriers to d/c: Port a Cath, PEG placement, PO intake  Pressure injury documentation    None  Consultants  IR Dentistry ENT PCCM  Procedures  2/16 multiple dental extractions 2/16 tracheostomy placement  Antibiotics   Anti-infectives (From admission, onward)   Start     Dose/Rate Route Frequency Ordered Stop   02/19/19 1200  ceFAZolin (ANCEF) IVPB 2g/100 mL premix     2 g 200 mL/hr over 30 Minutes Intravenous Every 8 hours 02/19/19 1045     02/19/19 1045  ceFAZolin (ANCEF) IVPB 2g/100 mL premix  Status:  Discontinued     2 g 200 mL/hr over 30 Minutes Intravenous Every 8 hours 02/19/19 1032 02/19/19 1045   02/18/19 0615  ceFAZolin (ANCEF) IVPB 2g/100 mL premix     2 g 200 mL/hr over 30 Minutes Intravenous  Once 02/18/19 0610 02/18/19  0240   02/18/19 9735  ceFAZolin (ANCEF) 2-4 GM/100ML-% IVPB    Note to Pharmacy: Maryjean Ka   : cabinet override      02/18/19 3299 02/18/19 0801        Subjective   And examined at bedside no acute distress and resting comfortably.  Unfortunately patient is unable to verbalize anything but he was able to communicate with a pad and pain.  Denied any complaints other than some low back pain which is chronic for him.  Objective   Vitals:   02/20/19 0800 02/20/19 0852 02/20/19 1030 02/20/19 1646  BP:  (!) 173/99 (!) 183/86 131/76  Pulse: 84 93 84   Resp: 18   16  Temp:   98.6 F (37 C) 99.3 F (37.4 C)  TempSrc:   Axillary Axillary  SpO2: 96% 97% 93% 96%  Weight:      Height:        Intake/Output Summary (Last 24 hours) at 02/20/2019 1718 Last data filed at 02/20/2019 1500 Gross per 24 hour  Intake --  Output 1055 ml  Net -1055 ml    Filed Weights   02/18/19 1521 02/19/19 0500 02/20/19 0500  Weight: 82.1 kg 81 kg 82.1 kg    Examination:  Physical Exam Vitals and nursing note reviewed.  Constitutional:      Appearance: Normal appearance.  HENT:     Head: Normocephalic and atraumatic.  Eyes:     Conjunctiva/sclera: Conjunctivae normal.  Cardiovascular:     Rate and Rhythm: Normal rate and regular rhythm.  Pulmonary:     Effort: Pulmonary effort is normal.     Breath sounds: Normal breath sounds.     Comments: Tracheostomy in place with secretions Abdominal:     General: Abdomen is flat.     Palpations: Abdomen is soft.  Musculoskeletal:        General: No swelling or tenderness.  Skin:    Coloration: Skin is not jaundiced or pale.  Neurological:     Mental Status: He is alert. Mental status is at baseline.  Psychiatric:        Mood and Affect: Mood normal.        Behavior: Behavior normal.     Data Reviewed: I have personally reviewed following labs and imaging studies  CBC: Recent Labs  Lab 02/17/19 1419 02/19/19 0419 02/20/19 0809  WBC 7.5 12.1* 10.3  HGB 11.9* 11.5* 11.2*  HCT 35.2* 33.3* 34.0*  MCV 94.6 94.1 97.7  PLT 269 262 242   Basic Metabolic Panel: Recent Labs  Lab 02/17/19 1419 02/19/19 0419 02/20/19 0809  NA 132* 134* 135  K 4.5 3.6 3.6  CL 98 97* 98  CO2 23 24 26   GLUCOSE 101* 110* 104*  BUN 11 9 12   CREATININE 0.99 0.97 1.03  CALCIUM 9.7 9.3 9.0   GFR: Estimated Creatinine Clearance: 82.7 mL/min (by C-G formula based on SCr of 1.03 mg/dL). Liver Function Tests: Recent Labs  Lab 02/17/19 1419  AST 20  ALT 15  ALKPHOS 61  BILITOT 0.9  PROT 6.9  ALBUMIN 3.9   No results for input(s): LIPASE, AMYLASE in the last 168 hours. No results for input(s): AMMONIA in the last 168 hours. Coagulation Profile: No results for input(s): INR, PROTIME in the last 168 hours. Cardiac Enzymes: No results for input(s): CKTOTAL, CKMB, CKMBINDEX, TROPONINI in the last 168  hours. BNP (last 3 results) No results for input(s): PROBNP in the last 8760 hours. HbA1C: No results for  input(s): HGBA1C in the last 72 hours. CBG: No results for input(s): GLUCAP in the last 168 hours. Lipid Profile: No results for input(s): CHOL, HDL, LDLCALC, TRIG, CHOLHDL, LDLDIRECT in the last 72 hours. Thyroid Function Tests: No results for input(s): TSH, T4TOTAL, FREET4, T3FREE, THYROIDAB in the last 72 hours. Anemia Panel: No results for input(s): VITAMINB12, FOLATE, FERRITIN, TIBC, IRON, RETICCTPCT in the last 72 hours. Sepsis Labs: No results for input(s): PROCALCITON, LATICACIDVEN in the last 168 hours.  Recent Results (from the past 240 hour(s))  SARS CORONAVIRUS 2 (TAT 6-24 HRS) Nasopharyngeal Nasopharyngeal Swab     Status: None   Collection Time: 02/17/19  2:48 PM   Specimen: Nasopharyngeal Swab  Result Value Ref Range Status   SARS Coronavirus 2 NEGATIVE NEGATIVE Final    Comment: (NOTE) SARS-CoV-2 target nucleic acids are NOT DETECTED. The SARS-CoV-2 RNA is generally detectable in upper and lower respiratory specimens during the acute phase of infection. Negative results do not preclude SARS-CoV-2 infection, do not rule out co-infections with other pathogens, and should not be used as the sole basis for treatment or other patient management decisions. Negative results must be combined with clinical observations, patient history, and epidemiological information. The expected result is Negative. Fact Sheet for Patients: SugarRoll.be Fact Sheet for Healthcare Providers: https://www.woods-mathews.com/ This test is not yet approved or cleared by the [place] FDA and  has been authorized for detection and/or diagnosis of SARS-CoV-2 by FDA under an Emergency Use Authorization (EUA). This EUA will remain  in effect (meaning this test can be used) for the duration of the COVID-19 declaration under Section 56 4(b)(1) of the  Act, 21 U.S.C. section 360bbb-3(b)(1), unless the authorization is terminated or revoked sooner. Performed at [facility], [place] [address], [place], [place] [address]   MRSA PCR Screening     Status: None   Collection Time: 02/18/19  5:00 PM  Result Value Ref Range Status   MRSA by PCR NEGATIVE NEGATIVE Final    Comment:        The GeneXpert MRSA Assay (FDA approved for NASAL specimens only), is one component of a comprehensive MRSA colonization surveillance program. It is not intended to diagnose MRSA infection nor to guide or monitor treatment for MRSA infections. Performed at [facility], [place] [address], [place], [place] [address]   Culture, blood (Routine X 2) w Reflex to ID Panel     Status: None (Preliminary result)   Collection Time: 02/19/19 11:18 AM   Specimen: BLOOD  Result Value Ref Range Status   Specimen Description BLOOD RIGHT ANTECUBITAL  Final   Special Requests   Final    BOTTLES DRAWN AEROBIC AND ANAEROBIC Blood Culture results may not be optimal due to an excessive volume of blood received in culture bottles   Culture   Final    NO GROWTH < 24 HOURS Performed at [facility], [place] [address], [place], [place] [address]    Report Status PENDING  Incomplete  Culture, blood (Routine X 2) w Reflex to ID Panel     Status: None (Preliminary result)   Collection Time: 02/19/19 11:18 AM   Specimen: BLOOD RIGHT ARM  Result Value Ref Range Status   Specimen Description BLOOD RIGHT ARM  Final   Special Requests   Final    BOTTLES DRAWN AEROBIC AND ANAEROBIC Blood Culture results may not be optimal due to an excessive volume of blood received in culture bottles   Culture  Final    NO GROWTH < 24 HOURS Performed at [facility], [place] [address], [place], [place] [address]    Report Status PENDING  Incomplete         Radiology Studies: CT ABDOMEN WO CONTRAST  Result Date: 02/20/2019 CLINICAL DATA:  Laryngeal squamous carcinoma and  evaluation for gastrostomy tube placement prior to starting therapy. EXAM: CT ABDOMEN WITHOUT CONTRAST TECHNIQUE: Multidetector CT imaging of the abdomen was performed following the standard protocol without IV contrast. COMPARISON:  PET scan on 01/27/2019 FINDINGS: Lower chest: Atelectasis and scarring at both lung bases. Hepatobiliary: No focal liver abnormality is seen. No gallstones, gallbladder wall thickening, or biliary dilatation. Pancreas: Unremarkable. No pancreatic ductal dilatation or surrounding inflammatory changes. Spleen: Normal in size without focal abnormality. Adrenals/Urinary Tract: Adrenal glands are unremarkable. Kidneys are normal, without renal calculi, focal lesion, or hydronephrosis. Stomach/Bowel: No hiatal hernia. The stomach has a horizontal lie, is fairly deeply located and consistently is situated directly behind the transverse colon on both the current CT as well as the prior PET scan. This makes the patient a poor candidate for percutaneous gastrostomy even after gastric insufflation. There are some mildly dilated anterior small bowel loops inferior to the transverse colon suggestive of a component of mild ileus. Other small bowel loops are not dilated. No evidence of free air or abnormal fluid collection in the abdomen. Vascular/Lymphatic: Aortic atherosclerosis with calcified plaque present. The infrarenal aorta just below the IMA origin shows slight bulge measuring up to 2.6 cm in diameter. This is not overtly aneurysmal. No enlarged abdominal or pelvic lymph nodes. Other:  No ascites or body wall edema. No abdominal wall hernia. Musculoskeletal: No acute or significant osseous findings. IMPRESSION: 1. The stomach has a horizontal lie directly behind the transverse colon on both the current CT and prior PET scan. This makes the patient a poor candidate for percutaneous gastrostomy even after gastric insufflation. Recommend surgical consultation for possible surgical gastrostomy  versus jejunostomy. 2. There are some mildly dilated anterior small bowel loops inferior to the transverse colon suggestive of a component of mild ileus. No evidence of bowel obstruction, free air or abnormal fluid collection in the abdomen. 3. Aortic atherosclerosis with slight bulge of the infrarenal aorta measuring up to 2.6 cm in diameter. This is not overtly aneurysmal. Recommend follow-up abdominal ultrasound in 3 years. This recommendation follows ACR consensus guidelines: White Paper of the ACR Incidental Findings Committee II on Vascular Findings. J Am Coll Radiol 2013; 10: Aortic Atherosclerosis (ICD10-I70.0). Electronically Signed   By: Aletta Edouard M.D.   On: 02/20/2019 08:43        Scheduled Meds: . chlorhexidine  15 mL Mouth Rinse BID  . Chlorhexidine Gluconate Cloth  6 each Topical Q0600  .  HYDROmorphone (DILAUDID) injection  0.5 mg Intravenous Once  . mouth rinse  15 mL Mouth Rinse q12n4p  . pantoprazole (PROTONIX) IV  40 mg Intravenous QHS   Continuous Infusions: .  ceFAZolin (ANCEF) IV 2 g (02/20/19 1126)  . lactated ringers    . lactated ringers 75 mL/hr at 02/19/19 1400     Time spent: 26 minutes with over 50% of the time coordinating the patient's care    Harold Hedge, DO [facility] Pager [phone]  Call night coverage person covering after 7pm

## 2019-02-20 NOTE — Progress Notes (Signed)
Patient ID: [name], male   DOB: 12/13/1960, 59 y.o.   MRN: [id number] Request received for both port a cath and G tube placements in pt. Case/imaging reviewed by Dr. Annamaria Boots. Latest CT abd reveals:  1. The stomach has a horizontal lie directly behind the transverse colon on both the current CT and prior PET scan. This makes the patient a poor candidate for percutaneous gastrostomy even after gastric insufflation. Recommend surgical consultation for possible surgical gastrostomy versus jejunostomy. 2. There are some mildly dilated anterior small bowel loops inferior to the transverse colon suggestive of a component of mild ileus. No evidence of bowel obstruction, free air or abnormal fluid collection in the abdomen. 3. Aortic atherosclerosis with slight bulge of the infrarenal aorta measuring up to 2.6 cm in diameter. This is not overtly aneurysmal  Pt will need surgical consult for G tube placement; IR will tent schedule for port a cath placement on 2/19; Pt/Dr. Delton Coombes updated.

## 2019-02-20 NOTE — Consult Note (Signed)
Chief Complaint: Patient was seen in consultation today for port-a-cath placement   Referring Physician(s): Elsworth Soho, Rakesh/Katragadda,S  Supervising Physician: Daryll Brod  Patient Status: [facility] - In-pt  History of Present Illness: [name] is a 59 y.o. male who is evaluated today for a port-a-cath placement. 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 Patient came to [facility] hospital for dental extractions, and eventually underwent a tracheostomy for airway protection. IR was consulted for G tube placement, but due to patient's anatomy, G tube placement will have to be done surgically.    Today, patient is alert and awake in his bed. He denies any fever, chills, SOB, chest pain, nausea, vomiting or bleeding. He reports he has diffuse abdominal pain since yesterday. He states he has not had a bowel movement and has not eaten in 2 days. Patient reports his blood thinner(plavix) has been held since last Thursday. Procedure methods and risks of procedure, including infection, bleeding, DVT and injury to surrounding structure were discussed with patient. Patient consented.   Past Medical History:  Diagnosis Date  . Anxiety   . Arthritis    back  . Bradycardia    a. During 11/2012 adm: lopressor decreased.  . Cancer of larynx ([place])   . Carotid artery stenosis   . 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 Coronary artery disease    a. Diag cath 10/2012 for CP/abnormal nuc -> planned PCI s/p LAD atherectomy/DES placement 11/05/12.  Marland Kitchen GERD (gastroesophageal reflux disease)   . History of hiatal hernia   . Hypercholesteremia   . Hypertension   . Stroke ([place]) 07/31/2018   bilateral vertebrobasilar occlusion; "left side weaker thanit was before."    Past Surgical History:  Procedure Laterality Date  . CARDIAC CATHETERIZATION   10/29/2012  . CORONARY ANGIOPLASTY WITH STENT PLACEMENT  11/05/2012  . DIRECT LARYNGOSCOPY N/A 01/02/2019   Procedure: MICRO DIRECT LARYNGOSCOPY BIOPSY OF LARYNGEAL MASS;  Surgeon: [name], MD;  Location: [place] [facility];  Service: ENT;  Laterality: N/A;  . HEMORRHOID SURGERY  ~ 2010  . INSERTION OF MESH N/A 06/02/2015   Procedure: INSERTION OF MESH;  Surgeon: [name], MD;  Location: [facility];  Service: General;  Laterality: N/A;  . IR ANGIO INTRA EXTRACRAN SEL COM CAROTID INNOMINATE BILAT MOD SED  08/02/2018  . IR ANGIO VERTEBRAL SEL VERTEBRAL BILAT MOD SED  08/02/2018  . LEFT HEART CATHETERIZATION WITH CORONARY ANGIOGRAM N/A 10/30/2012   Procedure: LEFT HEART CATHETERIZATION WITH CORONARY ANGIOGRAM;  Surgeon: [name], MD;  Location: [facility] CATH LAB;  Service: Cardiovascular;  Laterality: N/A;  . MULTIPLE EXTRACTIONS WITH ALVEOLOPLASTY N/A 02/18/2019   Procedure: Extraction of tooth #'s 5-7, 10,11,14,20-29, 31 and 32 with alveoloplasty and maxiillary left buccal exostoses reductions;  Surgeon: [name], [name];  Location: [facility];  Service: Oral Surgery;  Laterality: N/A;  . PERCUTANEOUS CORONARY ROTOBLATOR INTERVENTION (PCI-R) N/A 11/05/2012   Procedure: PERCUTANEOUS CORONARY ROTOBLATOR INTERVENTION (PCI-R);  Surgeon: [name], MD;  Location: [facility] CATH LAB;  Service: Cardiovascular;  Laterality: N/A;  . TRACHEOSTOMY TUBE PLACEMENT N/A 02/18/2019   Procedure: Tracheostomy;  Surgeon: [name], MD;  Location: [place] OR;  Service: ENT;  Laterality: N/A;  . UMBILICAL HERNIA REPAIR N/A 06/02/2015   Procedure: UMBILICAL HERNIORRHAPHY WITH MESH;  Surgeon: [name], MD;  Location: [facility];  Service:  General;  Laterality: N/A;    Allergies: Prednisone  Medications: Prior to Admission medications   Medication Sig Start Date End Date Taking? Authorizing Provider  amLODipine (NORVASC) 5 MG tablet Take 1 tablet (5 mg total) by mouth daily. 12/19/18  Yes Frann Rider, NP  aspirin EC 81 MG tablet  Take 81 mg by mouth daily.   Yes [provider]  atorvastatin (LIPITOR) 80 MG tablet Take 1 tablet (80 mg total) by mouth every evening. 10/23/17  Yes Herminio Commons, MD  clopidogrel (PLAVIX) 75 MG tablet Take 1 tablet (75 mg total) by mouth daily. Patient not taking: Reported on 02/17/2019 12/24/18  Yes McCue, Janett Billow, NP  HYDROcodone-acetaminophen (NORCO) 10-325 MG tablet Take 1 tablet by mouth every 4 (four) hours as needed. 06/02/15  Yes [name], MD  lisinopril (ZESTRIL) 20 MG tablet Take 1 tablet (20 mg total) by mouth daily. Patient taking differently: Take 20 mg by mouth 2 (two) times daily.  08/03/18  Yes Vann, Jessica U, DO  metoprolol tartrate (LOPRESSOR) 25 MG tablet TAKE 1/2 TABLET BY MOUTH TWICE DAILY Patient taking differently: Take 12.5 mg by mouth 2 (two) times daily.  01/25/15  Yes Herminio Commons, MD  omeprazole (PRILOSEC) 20 MG capsule Take 20 mg by mouth daily.  07/22/18  Yes [provider]  penicillin v potassium (VEETID) 500 MG tablet Take 1,000 mg by mouth daily.  02/04/19  Yes [provider]  traMADol (ULTRAM) 50 MG tablet Take 50-100 mg by mouth 4 (four) times daily as needed (pain.).  01/29/19  Yes [provider]  CISPLATIN IV Inject into the vein. Every 21 days in conjunction with radiation therapy beginning 03/04/2019 03/04/19   [provider]  nitroGLYCERIN (NITROSTAT) 0.4 MG SL tablet Place 1 tablet (0.4 mg total) under the tongue every 5 (five) minutes as needed for chest pain (CP or SOB). Patient not taking: Reported on 02/17/2019 07/27/14   Herminio Commons, MD     Family History  Problem Relation Age of Onset  . Hypertension Mother   . Heart disease Father   . Hypertension Father   . Heart disease Sister   . Hypertension Maternal Grandmother   . Hypertension Maternal Grandfather   . Hypertension Paternal Grandmother   . Hypertension Paternal Grandfather     Social History   Socioeconomic History   . Marital status: Married    Spouse name: Not on file  . Number of children: 4  . Years of education: Not on file  . Highest education level: Not on file  Occupational History  . Occupation: Disabled  Tobacco Use  . Smoking status: Former Smoker    Packs/day: 3.00    Years: 32.00    Pack years: 96.00    Types: Cigarettes    Start date: 08/29/1977    Quit date: 11/29/2006    Years since quitting: 12.2  . Smokeless tobacco: Former Systems developer    Types: Chew  . Tobacco comment: 11/05/2012 "chewed tobacco when I play ball; aien't chewed since age 52"  Substance and Sexual Activity  . Alcohol use: Yes    Alcohol/week: 12.0 standard drinks    Types: 12 Cans of beer per week    Comment: Currently 3-4 beers per day. 02/13/19, was a heavy drinker in the past, - states he could drink a case a day   . Drug use: Yes    Types: Marijuana  . Sexual activity: Not Currently  Other Topics Concern  . Not  on file  Social History Narrative   Patient is disabled.  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   Patient quit smoking 10 to 12 years ago.  Patient previously 3 pack/day use.   Patient currently drinking 3-4 beers per day from 12 pack a day.   Patient denies use of chew or other illicit drugs.   Social Determinants of Health   Financial Resource Strain: Low Risk   . Difficulty of Paying Living Expenses: Not hard at all  Food Insecurity: No Food Insecurity  . Worried About Charity fundraiser in the Last Year: Never true  . Ran Out of Food in the Last Year: Never true  Transportation Needs: No Transportation Needs  . Lack of Transportation (Medical): No  . Lack of Transportation (Non-Medical): No  Physical Activity: Insufficiently Active  . Days of Exercise per Week: 2 days  . Minutes of Exercise per Session: 30 min  Stress: Stress Concern Present  . Feeling of Stress : To some extent  Social Connections: Moderately Isolated  . Frequency of Communication with  Friends and Family: Once a week  . Frequency of Social Gatherings with Friends and Family: Once a week  . Attends Religious Services: Never  . Active Member of Clubs or Organizations: No  . Attends Archivist Meetings: Never  . Marital Status: Married     Review of Systems  Constitutional: Negative for chills and fever.  Respiratory: Negative for chest tightness and shortness of breath.   Cardiovascular: Negative for chest pain.  Gastrointestinal: Positive for abdominal pain (diffuse). Negative for nausea and vomiting.    Vital Signs: BP (!) 183/86 (BP Location: Left Arm)   Pulse 84   Temp 98.6 F (37 C) (Axillary)   Resp 18   Ht 5\' 8"  (1.727 m)   Wt 181 lb (82.1 kg)   SpO2 93%   BMI 27.52 kg/m   Physical Exam Cardiovascular:     Rate and Rhythm: Normal rate and regular rhythm.     Heart sounds: Normal heart sounds.  Pulmonary:     Effort: Pulmonary effort is normal.     Breath sounds: Rhonchi (Diffuse) present.     Comments: Tracheostomy  Abdominal:     General: Bowel sounds are normal.     Palpations: Abdomen is soft.     Tenderness: There is abdominal tenderness (Mild epigastric tenderness).  Skin:    General: Skin is warm and dry.  Neurological:     Mental Status: He is alert and oriented to person, place, and time.     Imaging: CT ABDOMEN WO CONTRAST  Result Date: 02/20/2019 CLINICAL DATA:  Laryngeal squamous carcinoma and evaluation for gastrostomy tube placement prior to starting therapy. EXAM: CT ABDOMEN WITHOUT CONTRAST TECHNIQUE: Multidetector CT imaging of the abdomen was performed following the standard protocol without IV contrast. COMPARISON:  PET scan on 01/27/2019 FINDINGS: Lower chest: Atelectasis and scarring at both lung bases. Hepatobiliary: No focal liver abnormality is seen. No gallstones, gallbladder wall thickening, or biliary dilatation. Pancreas: Unremarkable. No pancreatic ductal dilatation or surrounding inflammatory changes.  Spleen: Normal in size without focal abnormality. Adrenals/Urinary Tract: Adrenal glands are unremarkable. Kidneys are normal, without renal calculi, focal lesion, or hydronephrosis. Stomach/Bowel: No hiatal hernia. The stomach has a horizontal lie, is fairly deeply located and consistently is situated directly behind the transverse colon on both the current CT as well as the prior PET scan. This makes the patient a poor candidate for  percutaneous gastrostomy even after gastric insufflation. There are some mildly dilated anterior small bowel loops inferior to the transverse colon suggestive of a component of mild ileus. Other small bowel loops are not dilated. No evidence of free air or abnormal fluid collection in the abdomen. Vascular/Lymphatic: Aortic atherosclerosis with calcified plaque present. The infrarenal aorta just below the IMA origin shows slight bulge measuring up to 2.6 cm in diameter. This is not overtly aneurysmal. No enlarged abdominal or pelvic lymph nodes. Other:  No ascites or body wall edema. No abdominal wall hernia. Musculoskeletal: No acute or significant osseous findings. IMPRESSION: 1. The stomach has a horizontal lie directly behind the transverse colon on both the current CT and prior PET scan. This makes the patient a poor candidate for percutaneous gastrostomy even after gastric insufflation. Recommend surgical consultation for possible surgical gastrostomy versus jejunostomy. 2. There are some mildly dilated anterior small bowel loops inferior to the transverse colon suggestive of a component of mild ileus. No evidence of bowel obstruction, free air or abnormal fluid collection in the abdomen. 3. Aortic atherosclerosis with slight bulge of the infrarenal aorta measuring up to 2.6 cm in diameter. This is not overtly aneurysmal. Recommend follow-up abdominal ultrasound in 3 years. This recommendation follows ACR consensus guidelines: White Paper of the ACR Incidental Findings Committee  II on Vascular Findings. J Am Coll Radiol 2013; 10: Aortic Atherosclerosis (ICD10-I70.0). Electronically Signed   By: Aletta Edouard M.D.   On: 02/20/2019 08:43   MR LUMBAR SPINE WO CONTRAST  Result Date: 02/14/2019 CLINICAL DATA:  Low back pain radiating to both legs. EXAM: MRI LUMBAR SPINE WITHOUT CONTRAST TECHNIQUE: Multiplanar, multisequence MR imaging of the lumbar spine was performed. No intravenous contrast was administered. COMPARISON:  MRI of the lumbar spine November 22, 2016 - axial images only FINDINGS: Segmentation:  Standard. Alignment:  Minimal retrolisthesis of L5 over S1. Vertebrae: Degenerative endplate changes at L9-J6. No evidence of discitis, fracture nor suspicious bone lesion. Conus medullaris and cauda equina: Conus extends to the L1 level. Conus and cauda equina appear normal. Paraspinal and other soft tissues: Mild bilateral perirenal fat stranding. Disc levels: T12-L1: Shallow disc bulge without spinal canal or neural foraminal stenosis. L1-2: No spinal canal or neural foraminal stenosis. L2-3: No spinal canal or neural foraminal stenosis. L3-4: Disc bulge and facet degenerative changes causing minimal indentation on the thecal sac. No significant neural foraminal stenosis. L4-5: Disc bulge and moderate facet degenerative changes causing minimal indentation on the thecal sac. There is no significant neural foraminal stenosis. L5-S1: Decrease of disc space height, disc bulge and moderate facet degenerative changes resulting in moderate bilateral neural foraminal narrowing. No significant changes noted when compared to MRI performed in 2018. IMPRESSION: 1. No acute abnormality of the lumbar spine. 2. Mild degenerative changes, more pronounced at L5-S1 with moderate bilateral neural foraminal narrowing. Electronically Signed   By: Pedro Earls M.D.   On: 02/14/2019 15:07   NM PET Image Initial (PI) Skull Base To Thigh  Result Date: 01/27/2019 CLINICAL DATA:  Initial  treatment strategy for head neck cancer. EXAM: NUCLEAR MEDICINE PET SKULL BASE TO THIGH TECHNIQUE: 8.9 mCi F-18 FDG was injected intravenously. Full-ring PET imaging was performed from the skull base to thigh after the radiotracer. CT data was obtained and used for attenuation correction and anatomic localization. Fasting blood glucose: 111 mg/dl COMPARISON:  None. FINDINGS: Mediastinal blood pool activity: SUV max  3 Liver activity: SUV max NA NECK: Abnormal hypermetabolism identified in  the left and posterior laryngeal region with SUV max = 21.9 consistent with the patient's known neoplasm. Hypermetabolic metastatic lymphadenopathy is identified in the neck bilaterally at level II and III positions. Index left-sided 11 mm short axis level II node (28/4) demonstrates SUV max = 13.7. Index right-sided 11 mm short axis level III lymph node demonstrates SUV max = 18.4. Incidental CT findings: none CHEST: No hypermetabolic mediastinal or hilar nodes. No suspicious pulmonary nodules on the CT scan. Incidental CT findings: Coronary artery calcification is evident. Atherosclerotic calcification is noted in the wall of the thoracic aorta. ABDOMEN/PELVIS: No abnormal hypermetabolic activity within the liver, pancreas, adrenal glands, or spleen. No hypermetabolic lymph nodes in the abdomen or pelvis. Incidental CT findings: There is abdominal aortic atherosclerosis without aneurysm. SKELETON: No focal hypermetabolic activity to suggest skeletal metastasis. Incidental CT findings: none IMPRESSION: 1. Hypermetabolic laryngeal lesion consistent with the patient's known primary malignancy. 2. Hypermetabolic cervical metastatic disease bilaterally. 3. No evidence for hypermetabolic metastases in the chest, abdomen, or pelvis. Electronically Signed   By: Misty Stanley M.D.   On: 01/27/2019 14:07   DG Chest Port 1 View  Result Date: 02/18/2019 CLINICAL DATA:  Check tracheostomy tube EXAM: PORTABLE CHEST 1 VIEW COMPARISON:   07/31/2018 FINDINGS: Tracheostomy tube is now seen in satisfactory position. Cardiac shadow is within normal limits. The lungs are clear. No bony abnormality is seen. IMPRESSION: Tracheostomy tube in satisfactory position. Electronically Signed   By: Inez Catalina M.D.   On: 02/18/2019 13:46   VAS US CAROTID  Result Date: 02/13/2019 Carotid Arterial Duplex Study Indications:       CVA. Risk Factors:      Hypertension, hyperlipidemia, coronary artery disease. Other Factors:     Neck and throat cancer. Comparison Study:  IR angio vertebral 08-02-18, CT angio neck 08-01-18. Performing Technologist: Baldwin Crown ARDMS, RVT  Examination Guidelines: A complete evaluation includes B-mode imaging, spectral Doppler, color Doppler, and power Doppler as needed of all accessible portions of each vessel. Bilateral testing is considered an integral part of a complete examination. Limited examinations for reoccurring indications may be performed as noted.  Right Carotid Findings: +----------+--------+--------+--------+-------------------------+--------------+           PSV cm/sEDV cm/sStenosisPlaque Description       Comments       +----------+--------+--------+--------+-------------------------+--------------+ CCA Prox  112     29                                                      +----------+--------+--------+--------+-------------------------+--------------+ CCA Distal48      14                                                      +----------+--------+--------+--------+-------------------------+--------------+ ICA Prox  195     66      40-59%  heterogenous and calcific               +----------+--------+--------+--------+-------------------------+--------------+ ICA Mid   130     54                                                      +----------+--------+--------+--------+-------------------------+--------------+  ICA Distal88      30                                                       +----------+--------+--------+--------+-------------------------+--------------+ ECA       104     19                                       low resistance +----------+--------+--------+--------+-------------------------+--------------+ +----------+--------+-------+----------------+-------------------+           PSV cm/sEDV cmsDescribe        Arm Pressure (mmHG) +----------+--------+-------+----------------+-------------------+ [id number]             Multiphasic, WNL                    +----------+--------+-------+----------------+-------------------+ +---------+--------+--+--------+-+---------+ VertebralPSV cm/s28EDV cm/s9Antegrade +---------+--------+--+--------+-+---------+  Left Carotid Findings: +----------+--------+--------+--------+------------------+---------------------+           PSV cm/sEDV cm/sStenosisPlaque DescriptionComments              +----------+--------+--------+--------+------------------+---------------------+ CCA Prox  102     27                                                      +----------+--------+--------+--------+------------------+---------------------+ CCA Distal100     20                                                      +----------+--------+--------+--------+------------------+---------------------+ ICA Prox  43      9       1-39%   heterogenous                            +----------+--------+--------+--------+------------------+---------------------+ ICA Mid                                             unable to visualize                                                       due to shadowing      +----------+--------+--------+--------+------------------+---------------------+ ICA Distal84      39                                                      +----------+--------+--------+--------+------------------+---------------------+ ECA       95      20              heterogenous      low  resistance        +----------+--------+--------+--------+------------------+---------------------+ +----------+--------+--------+----------------+-------------------+  PSV cm/sEDV cm/sDescribe        Arm Pressure (mmHG) +----------+--------+--------+----------------+-------------------+ [id number]             Multiphasic, WNL                    +----------+--------+--------+----------------+-------------------+ +---------+--------+--+--------+--+---------+ VertebralPSV cm/s42EDV cm/s13Antegrade +---------+--------+--+--------+--+---------+   Summary: Right Carotid: Velocities in the right ICA are consistent with a 40-59%                stenosis. Left Carotid: Velocities in the left ICA are consistent with a 1-39% stenosis. Vertebrals:  Bilateral vertebral arteries demonstrate antegrade flow. Subclavians: Normal flow hemodynamics were seen in bilateral subclavian              arteries. *See table(s) above for measurements and observations.  Electronically signed by Harold Barban MD on 02/13/2019 at 7:27:20 AM.    Final     Labs:  CBC: Recent Labs    01/02/19 1106 02/17/19 1419 02/19/19 0419 02/20/19 0809  WBC 6.9 7.5 12.1* 10.3  HGB 14.5 11.9* 11.5* 11.2*  HCT 39.8 35.2* 33.3* 34.0*  PLT 299 269 262 258    COAGS: Recent Labs    08/02/18 0537  INR 1.0    BMP: Recent Labs    01/02/19 1106 02/17/19 1419 02/19/19 0419 02/20/19 0809  NA 129* 132* 134* 135  K 4.4 4.5 3.6 3.6  CL 96* 98 97* 98  CO2 22 23 24 26   GLUCOSE 122* 101* 110* 104*  BUN 12 11 9 12   CALCIUM 9.6 9.7 9.3 9.0  CREATININE 0.99 0.99 0.97 1.03  GFRNONAA >60 >60 >60 >60  GFRAA >60 >60 >60 >60    LIVER FUNCTION TESTS: Recent Labs    07/31/18 1104 02/17/19 1419  BILITOT 1.0 0.9  AST 19 20  ALT 12 15  ALKPHOS 44 61  PROT 8.4* 6.9  ALBUMIN 4.8 3.9    TUMOR MARKERS: No results for input(s): AFPTM, CEA, CA199, CHROMGRNA in the last 8760 hours.  Assessment and  Plan:  Supraglottic posterior laryngeal squamous cell carcinoma   Patient will tent be receiving port-a-cath placement 2/19 for his upcoming planned  chemotherapy. Procedure methods and risks of procedure including bleeding, infection, DVT and injury to surrounding structures were discussed.  All questions were answered and patient consented to the procedure.   Patient will be closely followed in the hospital after port-a-cath placement. IR should be contacted if there are any complications after the procedure. He is currently afebrile and latest blood cx are neg to date; WBC is normal. He is on IV Ancef. He is COVID -19 neg.   Thank you for this interesting consult.  I greatly enjoyed meeting JAVONE YBANEZ and look forward to participating in their care.  A copy of this report was sent to the requesting provider on this date.  Electronically Signed: D. Rowe Robert, PA-C / Donnamarie Rossetti, PA-S 02/20/2019, 2:20 PM   I spent a total of 30 minutes in face to face in clinical consultation, greater than 50% of which was counseling/coordinating care for port-a-cath placement

## 2019-02-20 NOTE — [place] Notes (Signed)
Initial Nutrition Assessment  DOCUMENTATION CODES:   Not applicable  INTERVENTION:   Ensure Enlive po QID, each supplement provides 350 kcal and 20 grams of protein  Magic cup TID with meals, each supplement provides 290 kcal and 9 grams of protein   NUTRITION DIAGNOSIS:   Inadequate oral intake related to dysphagia as evidenced by NPO status.  GOAL:   Patient will meet greater than or equal to 90% of their needs  MONITOR:   Diet advancement, PO intake, Supplement acceptance, Labs, Weight trends  REASON FOR ASSESSMENT:   Consult Other (Comment)  ASSESSMENT:   59 yo male presented for dental extraction but required emergent trach for upper airway obstruction. Pt with new diagnosis of squamous cell laryngeal carcinoma. 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  2/16 Trach placement, dental extractions (now edentulous)  Noted Oncology RN nagivator strongly encouraging PEG placement. Agree with this recommendations. Pt has previously refused. Noted plan for Port and PEG placement by IR possible this admission  Noted pt is also followed by Outpatient Oncology RD; noted RD recommend oral nutrition supplements as well as RD provided high calorie/high protein nutrition therapy education and included discussion on soft foods  SLP following for PMV and diet  Curent wt 82 kg; weight relatively stable per weight encounters  Labs: reviewed Meds: reviewed  Diet Order:   Diet Order            Diet NPO time specified  Diet effective now              EDUCATION NEEDS:   Education needs have been addressed  Skin:  Skin Assessment: Reviewed RN Assessment  Last BM:  2/15  Height:   Ht Readings from Last 1 Encounters:  02/18/19 5\' 8"  (1.727 m)    Weight:   Wt Readings from Last 1 Encounters:  02/20/19 82.1 kg    BMI:  Body mass index is 27.52 kg/m.  Estimated Nutritional Needs:   Kcal:  2400-2800  kcals  Protein:  120-140 g  Fluid:  >/= 2 L   Kerman Passey MS, RDN, LDN, CNSC RD Pager Number and Weekend/On-Call After Hours Pager Located in [place]

## 2019-02-21 ENCOUNTER — Inpatient Hospital Stay (HOSPITAL_COMMUNITY): Payer: 59

## 2019-02-21 ENCOUNTER — Encounter (HOSPITAL_COMMUNITY): Admission: RE | Payer: Self-pay | Source: Home / Self Care

## 2019-02-21 ENCOUNTER — Encounter (HOSPITAL_COMMUNITY): Payer: Self-pay | Admitting: *Deleted

## 2019-02-21 ENCOUNTER — Ambulatory Visit (HOSPITAL_COMMUNITY): Admission: RE | Admit: 2019-02-21 | Payer: 59 | Source: Home / Self Care | Admitting: General Surgery

## 2019-02-21 DIAGNOSIS — K0820 Unspecified atrophy of edentulous alveolar ridge: Secondary | ICD-10-CM

## 2019-02-21 DIAGNOSIS — K08109 Complete loss of teeth, unspecified cause, unspecified class: Secondary | ICD-10-CM

## 2019-02-21 DIAGNOSIS — F329 Major depressive disorder, single episode, unspecified: Secondary | ICD-10-CM

## 2019-02-21 DIAGNOSIS — K08199 Complete loss of teeth due to other specified cause, unspecified class: Secondary | ICD-10-CM

## 2019-02-21 LAB — CBC WITH DIFFERENTIAL/PLATELET
Abs Immature Granulocytes: 0.05 10*3/uL (ref 0.00–0.07)
Basophils Absolute: 0 10*3/uL (ref 0.0–0.1)
Basophils Relative: 0 %
Eosinophils Absolute: 0.1 10*3/uL (ref 0.0–0.5)
Eosinophils Relative: 1 %
HCT: 31.7 % — ABNORMAL LOW (ref 39.0–52.0)
Hemoglobin: 10.6 g/dL — ABNORMAL LOW (ref 13.0–17.0)
Immature Granulocytes: 1 %
Lymphocytes Relative: 11 %
Lymphs Abs: 0.9 10*3/uL (ref 0.7–4.0)
MCH: 31.9 pg (ref 26.0–34.0)
MCHC: 33.4 g/dL (ref 30.0–36.0)
MCV: 95.5 fL (ref 80.0–100.0)
Monocytes Absolute: 0.9 10*3/uL (ref 0.1–1.0)
Monocytes Relative: 11 %
Neutro Abs: 6.7 10*3/uL (ref 1.7–7.7)
Neutrophils Relative %: 76 %
Platelets: 249 10*3/uL (ref 150–400)
RBC: 3.32 MIL/uL — ABNORMAL LOW (ref 4.22–5.81)
RDW: 13.1 % (ref 11.5–15.5)
WBC: 8.7 10*3/uL (ref 4.0–10.5)
nRBC: 0 % (ref 0.0–0.2)

## 2019-02-21 LAB — BASIC METABOLIC PANEL
Anion gap: 11 (ref 5–15)
BUN: 13 mg/dL (ref 6–20)
CO2: 25 mmol/L (ref 22–32)
Calcium: 8.8 mg/dL — ABNORMAL LOW (ref 8.9–10.3)
Chloride: 96 mmol/L — ABNORMAL LOW (ref 98–111)
Creatinine, Ser: 0.91 mg/dL (ref 0.61–1.24)
GFR calc Af Amer: >60 mL/min (ref >60)
GFR calc non Af Amer: >60 mL/min (ref >60)
Glucose, Bld: 108 mg/dL — ABNORMAL HIGH (ref 70–99)
Potassium: 3.4 mmol/L — ABNORMAL LOW (ref 3.5–5.1)
Sodium: 132 mmol/L — ABNORMAL LOW (ref 135–145)

## 2019-02-21 LAB — PROTIME-INR
INR: 1 (ref 0.8–1.2)
Prothrombin Time: 12.9 s (ref 11.4–15.2)

## 2019-02-21 SURGERY — INSERTION, TUNNELED CENTRAL VENOUS DEVICE, WITH PORT
Anesthesia: Monitor Anesthesia Care | Laterality: Left

## 2019-02-21 MED ORDER — AMLODIPINE BESYLATE 5 MG PO TABS
5.0000 mg | ORAL_TABLET | Freq: Every day | ORAL | Status: DC
  Administered 2019-02-21 – 2019-02-26 (×5): 5 mg via ORAL
  Filled 2019-02-21 (×5): qty 1

## 2019-02-21 MED ORDER — FENTANYL CITRATE (PF) 100 MCG/2ML IJ SOLN
INTRAMUSCULAR | Status: AC
  Filled 2019-02-21: qty 2

## 2019-02-21 MED ORDER — HEPARIN SOD (PORK) LOCK FLUSH 100 UNIT/ML IV SOLN
INTRAVENOUS | Status: AC
  Filled 2019-02-21: qty 5

## 2019-02-21 MED ORDER — MIDAZOLAM HCL 2 MG/2ML IJ SOLN
INTRAMUSCULAR | Status: AC
  Filled 2019-02-21: qty 2

## 2019-02-21 MED ORDER — LIDOCAINE-EPINEPHRINE 1 %-1:100000 IJ SOLN
INTRAMUSCULAR | Status: AC
  Filled 2019-02-21: qty 1

## 2019-02-21 MED ORDER — ATORVASTATIN CALCIUM 80 MG PO TABS
80.0000 mg | ORAL_TABLET | Freq: Every evening | ORAL | Status: DC
  Administered 2019-02-21 – 2019-02-25 (×5): 80 mg via ORAL
  Filled 2019-02-21 (×5): qty 1

## 2019-02-21 MED ORDER — LISINOPRIL 20 MG PO TABS
20.0000 mg | ORAL_TABLET | Freq: Every day | ORAL | Status: DC
  Administered 2019-02-21 – 2019-02-26 (×6): 20 mg via ORAL
  Filled 2019-02-21 (×6): qty 1

## 2019-02-21 MED ORDER — LIDOCAINE HCL 1 % IJ SOLN
INTRAMUSCULAR | Status: DC | PRN
  Administered 2019-02-21: 5 mL

## 2019-02-21 MED ORDER — HEPARIN SOD (PORK) LOCK FLUSH 100 UNIT/ML IV SOLN
INTRAVENOUS | Status: DC | PRN
  Administered 2019-02-21: 500 [IU] via INTRAVENOUS

## 2019-02-21 MED ORDER — SODIUM CHLORIDE 0.9 % IR SOLN
200.0000 mL | Status: DC
  Administered 2019-02-21: 200 mL

## 2019-02-21 NOTE — [place] Notes (Signed)
Subjective: Resting comfortably in bed. No distress.  Objective: Vital signs in last 24 hours: Temp:  [98 F (36.7 C)-99.3 F (37.4 C)] 99 F (37.2 C) (02/19 0400) Pulse Rate:  [84-93] 84 (02/18 1030) Resp:  [16-18] 16 (02/18 1646) BP: (131-183)/(76-99) 174/99 (02/19 0400) SpO2:  [93 %-97 %] 94 % (02/19 0400) FiO2 (%):  [28 %] 28 % (02/19 0230) Weight:  [83 kg] 83 kg (02/19 0500)  General: Alert and responsive. NAD. Head: Normocephalic, no evidence injury, no tenderness, facial buttresses intact without stepoff.  Eyes: PERRL, EOMI.  No scleral icterus, conjunctivae clear.  Nose: Normal skin and external support.  Anterior rhinoscopy reveals healthy pink mucosa over the septum and turbinates.  Oral cavity: s/p dental extractions. Significant facial edema. Neck: Trach in place. Breathing well via trach.  Recent Labs    02/20/19 0809 02/21/19 0447  WBC 10.3 8.7  HGB 11.2* 10.6*  HCT 34.0* 31.7*  PLT 258 249   Recent Labs    02/20/19 0809 02/21/19 0447  NA 135 132*  K 3.6 3.4*  CL 98 96*  CO2 26 25  GLUCOSE 104* 108*  BUN 12 13  CREATININE 1.03 0.91  CALCIUM 9.0 8.8*    Medications:  I have reviewed the patient's current medications. Scheduled: . chlorhexidine  15 mL Mouth Rinse BID  . Chlorhexidine Gluconate Cloth  6 each Topical Q0600  .  HYDROmorphone (DILAUDID) injection  0.5 mg Intravenous Once  . mouth rinse  15 mL Mouth Rinse q12n4p  . pantoprazole (PROTONIX) IV  40 mg Intravenous QHS   Continuous: .  ceFAZolin (ANCEF) IV 2 g (02/21/19 0347)  . lactated ringers    . lactated ringers 75 mL/hr at 02/19/19 1400    Assessment/Plan: POD #2 s/p trach and dental extractions. - On trach collar. Lurline Idol care teaching. Will change trach prior to discharge. - ChemoXRT per medical and radiation oncology.   LOS: 3 days   Ziair Penson W Laksh Hinners 02/21/2019, 7:18 AM

## 2019-02-21 NOTE — Procedures (Signed)
Laryngeal ca  S/p RUE DL POWER PICC No comp Stable ebl min Tip svcra Ready for use

## 2019-02-21 NOTE — H&P (View-Only) (Signed)
[facility] Surgery Consult Note  [name] [birth year]  [id number].    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  He is scheduled to undergo chemo and radiation therapy.  During induction he had a difficult time with friable tissue requiring several attempts before the airway was cannulated.  Dental extraction was completed and a tracheostomy was performed to protect the airway.  Patient on past medical history includes a CVA 07/31/2018.  He has severe carotid disease and underwent angiography for this 08/02/2018.  History of hypertension, coronary artery disease, and low back pain.  He is now scheduled for port placement, and we are asked to see for placement of an open gastrostomy tube.  He was seen by interventional radiology, Dr. Shelton Silvas reviewed his studies.  And notes the stomach lies behind the transverse colon on both the current CT and the PET scan.  They did not think he was a candidate for IR gastrostomy tube placement and we are asked to see.  He is on Plavix at home but has been off Plavix for his planned surgery since last Thursday.   ROS: Review of Systems  Unable to perform ROS: Other  Trach  Family History  Problem Relation Age of Onset  . Hypertension Mother   . Heart disease Father   . Hypertension Father   . Heart disease Sister   . Hypertension Maternal Grandmother   . Hypertension Maternal Grandfather   . Hypertension Paternal Grandmother   . Hypertension Paternal Grandfather     Past Medical History:  Diagnosis Date  . Anxiety   . Arthritis    back  . Bradycardia    a. During 11/2012 adm: lopressor decreased.  . Cancer of larynx ([place])   . Carotid artery stenosis   . Chronic lower back pain    "I  work Architect; messed back up ~ 30 yr ago; paralyzed for 2 days then" (11/05/2012)  . Coronary artery disease    a. Diag cath 10/2012 for CP/abnormal nuc -> planned PCI s/p LAD atherectomy/DES placement 11/05/12.  Marland Kitchen GERD (gastroesophageal reflux disease)   . History of hiatal hernia   . Hypercholesteremia   . Hypertension   . Stroke ([place]) 07/31/2018   bilateral vertebrobasilar occlusion; "left side weaker thanit was before."    Past Surgical History:  Procedure Laterality Date  . CARDIAC CATHETERIZATION  10/29/2012  . CORONARY ANGIOPLASTY WITH STENT PLACEMENT  11/05/2012  . DIRECT LARYNGOSCOPY N/A 01/02/2019   Procedure: MICRO DIRECT LARYNGOSCOPY BIOPSY OF LARYNGEAL MASS;  Surgeon: [name], MD;  Location: [facility];  Service: ENT;  Laterality: N/A;  . HEMORRHOID SURGERY  ~ 2010  . INSERTION OF MESH N/A 06/02/2015   Procedure: INSERTION OF MESH;  Surgeon: [name], MD;  Location: [facility];  Service: General;  Laterality: N/A;  . IR ANGIO INTRA EXTRACRAN SEL COM CAROTID INNOMINATE BILAT MOD SED  08/02/2018  . IR ANGIO VERTEBRAL SEL VERTEBRAL BILAT MOD SED  08/02/2018  . LEFT HEART CATHETERIZATION WITH CORONARY ANGIOGRAM N/A 10/30/2012   Procedure: LEFT HEART CATHETERIZATION WITH CORONARY ANGIOGRAM;  Surgeon: [name], MD;  Location: [facility] CATH [facility];  Service: Cardiovascular;  Laterality: N/A;  . MULTIPLE EXTRACTIONS WITH ALVEOLOPLASTY N/A 02/18/2019   Procedure: Extraction of tooth #'s 5-7, 10,11,14,20-29, 31 and 32 with  alveoloplasty and maxiillary left buccal exostoses reductions;  Surgeon: [name], [name];  Location: [facility];  Service: Oral Surgery;  Laterality: N/A;  . PERCUTANEOUS CORONARY ROTOBLATOR INTERVENTION (PCI-R) N/A 11/05/2012   Procedure: PERCUTANEOUS CORONARY ROTOBLATOR INTERVENTION (PCI-R);  Surgeon: [name], MD;  Location: [facility];  Service: Cardiovascular;  Laterality: N/A;  . TRACHEOSTOMY TUBE PLACEMENT N/A 02/18/2019   Procedure: Tracheostomy;   Surgeon: [name], MD;  Location: [facility];  Service: ENT;  Laterality: N/A;  . UMBILICAL HERNIA REPAIR N/A 06/02/2015   Procedure: UMBILICAL HERNIORRHAPHY WITH MESH;  Surgeon: [name], MD;  Location: [facility];  Service: General;  Laterality: N/A;    Social History:  reports that he quit smoking about 12 years ago. His smoking use included cigarettes. He started smoking about 41 years ago. He has a 96.00 pack-year smoking history. He has quit using smokeless tobacco.  His smokeless tobacco use included chew. He reports current alcohol use of about 12.0 standard drinks of alcohol per week. He reports current drug use. Drug: Marijuana.  Allergies:  Allergies  Allergen Reactions  . Prednisone Other (See Comments)    Chest pain    Medications Prior to Admission  Medication Sig Dispense Refill  . amLODipine (NORVASC) 5 MG tablet Take 1 tablet (5 mg total) by mouth daily. 30 tablet 0  . aspirin EC 81 MG tablet Take 81 mg by mouth daily.    Marland Kitchen atorvastatin (LIPITOR) 80 MG tablet Take 1 tablet (80 mg total) by mouth every evening. 90 tablet 3  . clopidogrel (PLAVIX) 75 MG tablet Take 1 tablet (75 mg total) by mouth daily. (Patient not taking: Reported on 02/17/2019) 90 tablet 3  . HYDROcodone-acetaminophen (NORCO) 10-325 MG tablet Take 1 tablet by mouth every 4 (four) hours as needed. 50 tablet 0  . lisinopril (ZESTRIL) 20 MG tablet Take 1 tablet (20 mg total) by mouth daily. (Patient taking differently: Take 20 mg by mouth 2 (two) times daily. ) 30 tablet 0  . metoprolol tartrate (LOPRESSOR) 25 MG tablet TAKE 1/2 TABLET BY MOUTH TWICE DAILY (Patient taking differently: Take 12.5 mg by mouth 2 (two) times daily. ) 30 tablet 3  . omeprazole (PRILOSEC) 20 MG capsule Take 20 mg by mouth daily.     . penicillin v potassium (VEETID) 500 MG tablet Take 1,000 mg by mouth daily.     . traMADol (ULTRAM) 50 MG tablet Take 50-100 mg by mouth 4 (four) times daily as needed (pain.).     Derrill Memo ON 03/04/2019]  CISPLATIN IV Inject into the vein. Every 21 days in conjunction with radiation therapy beginning 03/04/2019    . nitroGLYCERIN (NITROSTAT) 0.4 MG SL tablet Place 1 tablet (0.4 mg total) under the tongue every 5 (five) minutes as needed for chest pain (CP or SOB). (Patient not taking: Reported on 02/17/2019) 25 tablet 3    Blood pressure (!) 174/99, pulse 86, temperature 99 F (37.2 C), temperature source Axillary, resp. rate 20, height 5\' 8"  (1.727 m), weight 83 kg, SpO2 98 %. Physical Exam:  General: pleasant, WD, WN white male who is laying in bed in NAD HEENT: head is normocephalic, atraumatic.  Trach collar, mouth is sore from dental work some teeth remain  Sclera are noninjected.  Pupils are equalL.  Ears and nose without any masses or lesions.  Mouth is pink and moist Heart: regular, rate, and rhythm.  Normal s1,s2. No obvious murmurs, gallops, or rubs noted.  Palpable radial and pedal  pulses bilaterally Lungs: CTAB, no wheezes, rhonchi, or rales noted.  Respiratory effort nonlabored Abd: soft, NT, ND, +BS, no masses, hernias, or organomegaly, No prior surgeries MS: all 4 extremities are symmetrical with no cyanosis, clubbing, or edema. Skin: warm and dry with no masses, lesions, or rashes Neuro: Cranial nerves 2-12 grossly intact, speech is normal Psych: A&Ox3 with an appropriate affect.   Results for orders placed or performed during the hospital encounter of 02/18/19 (from the past 48 hour(s))  Culture, blood (Routine X 2) w Reflex to ID Panel     Status: None (Preliminary result)   Collection Time: 02/19/19 11:18 AM   Specimen: BLOOD  Result Value Ref Range   Specimen Description BLOOD RIGHT ANTECUBITAL    Special Requests      BOTTLES DRAWN AEROBIC AND ANAEROBIC Blood Culture results may not be optimal due to an excessive volume of blood received in culture bottles   Culture      NO GROWTH 2 DAYS Performed at [facility] [address], [place], [place] [address]     Report Status PENDING   Culture, blood (Routine X 2) w Reflex to ID Panel     Status: None (Preliminary result)   Collection Time: 02/19/19 11:18 AM   Specimen: BLOOD RIGHT ARM  Result Value Ref Range   Specimen Description BLOOD RIGHT ARM    Special Requests      BOTTLES DRAWN AEROBIC AND ANAEROBIC Blood Culture results may not be optimal due to an excessive volume of blood received in culture bottles   Culture      NO GROWTH 2 DAYS Performed at [facility], [place] [address], [place], [place] [address]    Report Status PENDING   CBC     Status: Abnormal   Collection Time: 02/20/19  8:09 AM  Result Value Ref Range   WBC 10.3 4.0 - 10.5 K/uL   RBC 3.48 (L) 4.22 - 5.81 MIL/uL   Hemoglobin 11.2 (L) 13.0 - 17.0 g/dL   HCT 34.0 (L) 39.0 - 52.0 %   MCV 97.7 80.0 - 100.0 fL   MCH 32.2 26.0 - 34.0 pg   MCHC 32.9 30.0 - 36.0 g/dL   RDW 13.2 11.5 - 15.5 %   Platelets 258 150 - 400 K/uL   nRBC 0.0 0.0 - 0.2 %    Comment: Performed at [facility], [place] [address] [place], [place] [address]  Basic metabolic panel     Status: Abnormal   Collection Time: 02/20/19  8:09 AM  Result Value Ref Range   Sodium 135 135 - 145 mmol/L   Potassium 3.6 3.5 - 5.1 mmol/L   Chloride 98 98 - 111 mmol/L   CO2 26 22 - 32 mmol/L   Glucose, Bld 104 (H) 70 - 99 mg/dL   BUN 12 6 - 20 mg/dL   Creatinine, Ser 1.03 0.61 - 1.24 mg/dL   Calcium 9.0 8.9 - 10.3 mg/dL   GFR calc non Af Amer >60 >60 mL/min   GFR calc Af Amer >60 >60 mL/min   Anion gap 11 5 - 15    Comment: Performed at [facility] [address], [place],  [address]  CBC with Differential/Platelet     Status: Abnormal   Collection Time: 02/21/19  4:47 AM  Result Value Ref Range   WBC 8.7 4.0 - 10.5 K/uL   RBC 3.32 (L) 4.22 - 5.81 MIL/uL   Hemoglobin 10.6 (L)  13.0 - 17.0 g/dL   HCT 31.7 (L) 39.0 - 52.0 %   MCV 95.5 80.0 - 100.0 fL   MCH 31.9 26.0 - 34.0 pg   MCHC 33.4 30.0 - 36.0 g/dL   RDW 13.1 11.5 - 15.5 %    Platelets 249 150 - 400 K/uL   nRBC 0.0 0.0 - 0.2 %   Neutrophils Relative % 76 %   Neutro Abs 6.7 1.7 - 7.7 K/uL   Lymphocytes Relative 11 %   Lymphs Abs 0.9 0.7 - 4.0 K/uL   Monocytes Relative 11 %   Monocytes Absolute 0.9 0.1 - 1.0 K/uL   Eosinophils Relative 1 %   Eosinophils Absolute 0.1 0.0 - 0.5 K/uL   Basophils Relative 0 %   Basophils Absolute 0.0 0.0 - 0.1 K/uL   Immature Granulocytes 1 %   Abs Immature Granulocytes 0.05 0.00 - 0.07 K/uL    Comment: Performed at [facility] [address], [place], [place] [address]  Protime-INR     Status: None   Collection Time: 02/21/19  4:47 AM  Result Value Ref Range   Prothrombin Time 12.9 11.4 - 15.2 seconds   INR 1.0 0.8 - 1.2    Comment: (NOTE) INR goal varies based on device and disease states. Performed at [facility], [place] [address] [place], [place] [address]   Basic metabolic panel     Status: Abnormal   Collection Time: 02/21/19  4:47 AM  Result Value Ref Range   Sodium 132 (L) 135 - 145 mmol/L   Potassium 3.4 (L) 3.5 - 5.1 mmol/L   Chloride 96 (L) 98 - 111 mmol/L   CO2 25 22 - 32 mmol/L   Glucose, Bld 108 (H) 70 - 99 mg/dL   BUN 13 6 - 20 mg/dL   Creatinine, Ser 0.91 0.61 - 1.24 mg/dL   Calcium 8.8 (L) 8.9 - 10.3 mg/dL   GFR calc non Af Amer >60 >60 mL/min   GFR calc Af Amer >60 >60 mL/min   Anion gap 11 5 - 15    Comment: Performed at [facility], [place] [address] [place],  [address]   CT ABDOMEN WO CONTRAST  Result Date: 02/20/2019 CLINICAL DATA:  Laryngeal squamous carcinoma and evaluation for gastrostomy tube placement prior to starting therapy. EXAM: CT ABDOMEN WITHOUT CONTRAST TECHNIQUE: Multidetector CT imaging of the abdomen was performed following the standard protocol without IV contrast. COMPARISON:  PET scan on 01/27/2019 FINDINGS: Lower chest: Atelectasis and scarring at both lung bases. Hepatobiliary: No focal liver abnormality is seen. No gallstones, gallbladder wall  thickening, or biliary dilatation. Pancreas: Unremarkable. No pancreatic ductal dilatation or surrounding inflammatory changes. Spleen: Normal in size without focal abnormality. Adrenals/Urinary Tract: Adrenal glands are unremarkable. Kidneys are normal, without renal calculi, focal lesion, or hydronephrosis. Stomach/Bowel: No hiatal hernia. The stomach has a horizontal lie, is fairly deeply located and consistently is situated directly behind the transverse colon on both the current CT as well as the prior PET scan. This makes the patient a poor candidate for percutaneous gastrostomy even after gastric insufflation. There are some mildly dilated anterior small bowel loops inferior to the transverse colon suggestive of a component of mild ileus. Other small bowel loops are not dilated. No evidence of free air or abnormal fluid collection in the abdomen. Vascular/Lymphatic: Aortic atherosclerosis with calcified plaque present. The infrarenal aorta just below the IMA origin shows slight bulge measuring up to 2.6 cm in diameter.  This is not overtly aneurysmal. No enlarged abdominal or pelvic lymph nodes. Other:  No ascites or body wall edema. No abdominal wall hernia. Musculoskeletal: No acute or significant osseous findings. IMPRESSION: 1. The stomach has a horizontal lie directly behind the transverse colon on both the current CT and prior PET scan. This makes the patient a poor candidate for percutaneous gastrostomy even after gastric insufflation. Recommend surgical consultation for possible surgical gastrostomy versus jejunostomy. 2. There are some mildly dilated anterior small bowel loops inferior to the transverse colon suggestive of a component of mild ileus. No evidence of bowel obstruction, free air or abnormal fluid collection in the abdomen. 3. Aortic atherosclerosis with slight bulge of the infrarenal aorta measuring up to 2.6 cm in diameter. This is not overtly aneurysmal. Recommend follow-up abdominal  ultrasound in 3 years. This recommendation follows ACR consensus guidelines: White Paper of the ACR Incidental Findings Committee II on Vascular Findings. J Am Coll Radiol 2013; 10: Aortic Atherosclerosis (ICD10-I70.0). Electronically Signed   By: Aletta Edouard M.D.   On: 02/20/2019 08:43      Assessment/Plan Supraglottic posterior laryngeal squamous cell carcinoma Traumatic intubation during dental procedure requiring tracheostomy placement 02/18/2019 Dr.Su Benjamine Mola, & Dr., Teena Dunk Hx CVA -off Plavix Hypertension CAD Chronic back pain  Pain: We will review with Dr. Rosendo Gros and see when we can get him on the schedule, he is currently n.p.o. for PICC placement by IR which has been completed and is ready for use.   Earnstine Regal [facility] Surgery 02/21/2019, 11:10 AM Please see Amion for pager number during day hours 7:00am-4:30pm

## 2019-02-21 NOTE — Consult Note (Signed)
[facility] Surgery Consult Note  [name] 18-Dec-1960  [id number].    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  He is scheduled to undergo chemo and radiation therapy.  During induction he had a difficult time with friable tissue requiring several attempts before the airway was cannulated.  Dental extraction was completed and a tracheostomy was performed to protect the airway.  Patient on past medical history includes a CVA 07/31/2018.  He has severe carotid disease and underwent angiography for this 08/02/2018.  History of hypertension, coronary artery disease, and low back pain.  He is now scheduled for port placement, and we are asked to see for placement of an open gastrostomy tube.  He was seen by interventional radiology, Dr. Shelton Silvas reviewed his studies.  And notes the stomach lies behind the transverse colon on both the current CT and the PET scan.  They did not think he was a candidate for IR gastrostomy tube placement and we are asked to see.  He is on Plavix at home but has been off Plavix for his planned surgery since last Thursday.   ROS: Review of Systems  Unable to perform ROS: Other  Trach  Family History  Problem Relation Age of Onset  . Hypertension Mother   . Heart disease Father   . Hypertension Father   . Heart disease Sister   . Hypertension Maternal Grandmother   . Hypertension Maternal Grandfather   . Hypertension Paternal Grandmother   . Hypertension Paternal Grandfather     Past Medical History:  Diagnosis Date  . Anxiety   . Arthritis    back  . Bradycardia    a. During 11/2012 adm: lopressor decreased.  . Cancer of larynx ([place])   . Carotid artery stenosis   . Chronic lower back pain    "I  work Architect; messed back up ~ 30 yr ago; paralyzed for 2 days then" (11/05/2012)  . Coronary artery disease    a. Diag cath 10/2012 for CP/abnormal nuc -> planned PCI s/p LAD atherectomy/DES placement 11/05/12.  Marland Kitchen GERD (gastroesophageal reflux disease)   . History of hiatal hernia   . Hypercholesteremia   . Hypertension   . Stroke ([place]) 07/31/2018   bilateral vertebrobasilar occlusion; "left side weaker thanit was before."    Past Surgical History:  Procedure Laterality Date  . CARDIAC CATHETERIZATION  10/29/2012  . CORONARY ANGIOPLASTY WITH STENT PLACEMENT  11/05/2012  . DIRECT LARYNGOSCOPY N/A 01/02/2019   Procedure: MICRO DIRECT LARYNGOSCOPY BIOPSY OF LARYNGEAL MASS;  Surgeon: [name], MD;  Location: [facility];  Service: ENT;  Laterality: N/A;  . HEMORRHOID SURGERY  ~ 2010  . INSERTION OF MESH N/A 06/02/2015   Procedure: INSERTION OF MESH;  Surgeon: [name], MD;  Location: [facility];  Service: General;  Laterality: N/A;  . IR ANGIO INTRA EXTRACRAN SEL COM CAROTID INNOMINATE BILAT MOD SED  08/02/2018  . IR ANGIO VERTEBRAL SEL VERTEBRAL BILAT MOD SED  08/02/2018  . LEFT HEART CATHETERIZATION WITH CORONARY ANGIOGRAM N/A 10/30/2012   Procedure: LEFT HEART CATHETERIZATION WITH CORONARY ANGIOGRAM;  Surgeon: [name], MD;  Location: [facility]ATH [facility];  Service: Cardiovascular;  Laterality: N/A;  . MULTIPLE EXTRACTIONS WITH ALVEOLOPLASTY N/A 02/18/2019   Procedure: Extraction of tooth #'s 5-7, 10,11,14,20-29, 31 and 32 with  alveoloplasty and maxiillary left buccal exostoses reductions;  Surgeon: [name], [name];  Location: [facility];  Service: Oral Surgery;  Laterality: N/A;  . PERCUTANEOUS CORONARY ROTOBLATOR INTERVENTION (PCI-R) N/A 11/05/2012   Procedure: PERCUTANEOUS CORONARY ROTOBLATOR INTERVENTION (PCI-R);  Surgeon: [name], MD;  Location: [facility] CATH LAB;  Service: Cardiovascular;  Laterality: N/A;  . TRACHEOSTOMY TUBE PLACEMENT N/A 02/18/2019   Procedure: Tracheostomy;   Surgeon: [name], MD;  Location: [facility];  Service: ENT;  Laterality: N/A;  . UMBILICAL HERNIA REPAIR N/A 06/02/2015   Procedure: UMBILICAL HERNIORRHAPHY WITH MESH;  Surgeon: [name], MD;  Location: [facility];  Service: General;  Laterality: N/A;    Social History:  reports that he quit smoking about 12 years ago. His smoking use included cigarettes. He started smoking about 41 years ago. He has a 96.00 pack-year smoking history. He has quit using smokeless tobacco.  His smokeless tobacco use included chew. He reports current alcohol use of about 12.0 standard drinks of alcohol per week. He reports current drug use. Drug: Marijuana.  Allergies:  Allergies  Allergen Reactions  . Prednisone Other (See Comments)    Chest pain    Medications Prior to Admission  Medication Sig Dispense Refill  . amLODipine (NORVASC) 5 MG tablet Take 1 tablet (5 mg total) by mouth daily. 30 tablet 0  . aspirin EC 81 MG tablet Take 81 mg by mouth daily.    Marland Kitchen atorvastatin (LIPITOR) 80 MG tablet Take 1 tablet (80 mg total) by mouth every evening. 90 tablet 3  . clopidogrel (PLAVIX) 75 MG tablet Take 1 tablet (75 mg total) by mouth daily. (Patient not taking: Reported on 02/17/2019) 90 tablet 3  . HYDROcodone-acetaminophen (NORCO) 10-325 MG tablet Take 1 tablet by mouth every 4 (four) hours as needed. 50 tablet 0  . lisinopril (ZESTRIL) 20 MG tablet Take 1 tablet (20 mg total) by mouth daily. (Patient taking differently: Take 20 mg by mouth 2 (two) times daily. ) 30 tablet 0  . metoprolol tartrate (LOPRESSOR) 25 MG tablet TAKE 1/2 TABLET BY MOUTH TWICE DAILY (Patient taking differently: Take 12.5 mg by mouth 2 (two) times daily. ) 30 tablet 3  . omeprazole (PRILOSEC) 20 MG capsule Take 20 mg by mouth daily.     . penicillin v potassium (VEETID) 500 MG tablet Take 1,000 mg by mouth daily.     . traMADol (ULTRAM) 50 MG tablet Take 50-100 mg by mouth 4 (four) times daily as needed (pain.).     Derrill Memo ON 03/04/2019]  CISPLATIN IV Inject into the vein. Every 21 days in conjunction with radiation therapy beginning 03/04/2019    . nitroGLYCERIN (NITROSTAT) 0.4 MG SL tablet Place 1 tablet (0.4 mg total) under the tongue every 5 (five) minutes as needed for chest pain (CP or SOB). (Patient not taking: Reported on 02/17/2019) 25 tablet 3    Blood pressure (!) 174/99, pulse 86, temperature 99 F (37.2 C), temperature source Axillary, resp. rate 20, height 5\' 8"  (1.727 m), weight 83 kg, SpO2 98 %. Physical Exam:  General: pleasant, WD, WN white male who is laying in bed in NAD HEENT: head is normocephalic, atraumatic.  Trach collar, mouth is sore from dental work some teeth remain  Sclera are noninjected.  Pupils are equalL.  Ears and nose without any masses or lesions.  Mouth is pink and moist Heart: regular, rate, and rhythm.  Normal s1,s2. No obvious murmurs, gallops, or rubs noted.  Palpable radial and pedal  pulses bilaterally Lungs: CTAB, no wheezes, rhonchi, or rales noted.  Respiratory effort nonlabored Abd: soft, NT, ND, +BS, no masses, hernias, or organomegaly, No prior surgeries MS: all 4 extremities are symmetrical with no cyanosis, clubbing, or edema. Skin: warm and dry with no masses, lesions, or rashes Neuro: Cranial nerves 2-12 grossly intact, speech is normal Psych: A&Ox3 with an appropriate affect.   Results for orders placed or performed during the hospital encounter of 02/18/19 (from the past 48 hour(s))  Culture, blood (Routine X 2) w Reflex to ID Panel     Status: None (Preliminary result)   Collection Time: 02/19/19 11:18 AM   Specimen: BLOOD  Result Value Ref Range   Specimen Description BLOOD RIGHT ANTECUBITAL    Special Requests      BOTTLES DRAWN AEROBIC AND ANAEROBIC Blood Culture results may not be optimal due to an excessive volume of blood received in culture bottles   Culture      NO GROWTH 2 DAYS Performed at [facility] [address], [place], [place] [address]     Report Status PENDING   Culture, blood (Routine X 2) w Reflex to ID Panel     Status: None (Preliminary result)   Collection Time: 02/19/19 11:18 AM   Specimen: BLOOD RIGHT ARM  Result Value Ref Range   Specimen Description BLOOD RIGHT ARM    Special Requests      BOTTLES DRAWN AEROBIC AND ANAEROBIC Blood Culture results may not be optimal due to an excessive volume of blood received in culture bottles   Culture      NO GROWTH 2 DAYS Performed at [facility], [place] [address], [place], [place] [address]    Report Status PENDING   CBC     Status: Abnormal   Collection Time: 02/20/19  8:09 AM  Result Value Ref Range   WBC 10.3 4.0 - 10.5 K/uL   RBC 3.48 (L) 4.22 - 5.81 MIL/uL   Hemoglobin 11.2 (L) 13.0 - 17.0 g/dL   HCT 34.0 (L) 39.0 - 52.0 %   MCV 97.7 80.0 - 100.0 fL   MCH 32.2 26.0 - 34.0 pg   MCHC 32.9 30.0 - 36.0 g/dL   RDW 13.2 11.5 - 15.5 %   Platelets 258 150 - 400 K/uL   nRBC 0.0 0.0 - 0.2 %    Comment: Performed at [facility], [place] [address], [place], [place] [address]  Basic metabolic panel     Status: Abnormal   Collection Time: 02/20/19  8:09 AM  Result Value Ref Range   Sodium 135 135 - 145 mmol/L   Potassium 3.6 3.5 - 5.1 mmol/L   Chloride 98 98 - 111 mmol/L   CO2 26 22 - 32 mmol/L   Glucose, Bld 104 (H) 70 - 99 mg/dL   BUN 12 6 - 20 mg/dL   Creatinine, Ser 1.03 0.61 - 1.24 mg/dL   Calcium 9.0 8.9 - 10.3 mg/dL   GFR calc non Af Amer >60 >60 mL/min   GFR calc Af Amer >60 >60 mL/min   Anion gap 11 5 - 15    Comment: Performed at [facility] [address] [place], [place] [address]  CBC with Differential/Platelet     Status: Abnormal   Collection Time: 02/21/19  4:47 AM  Result Value Ref Range   WBC 8.7 4.0 - 10.5 K/uL   RBC 3.32 (L) 4.22 - 5.81 MIL/uL   Hemoglobin 10.6 (L)  13.0 - 17.0 g/dL   HCT 31.7 (L) 39.0 - 52.0 %   MCV 95.5 80.0 - 100.0 fL   MCH 31.9 26.0 - 34.0 pg   MCHC 33.4 30.0 - 36.0 g/dL   RDW 13.1 11.5 - 15.5 %    Platelets 249 150 - 400 K/uL   nRBC 0.0 0.0 - 0.2 %   Neutrophils Relative % 76 %   Neutro Abs 6.7 1.7 - 7.7 K/uL   Lymphocytes Relative 11 %   Lymphs Abs 0.9 0.7 - 4.0 K/uL   Monocytes Relative 11 %   Monocytes Absolute 0.9 0.1 - 1.0 K/uL   Eosinophils Relative 1 %   Eosinophils Absolute 0.1 0.0 - 0.5 K/uL   Basophils Relative 0 %   Basophils Absolute 0.0 0.0 - 0.1 K/uL   Immature Granulocytes 1 %   Abs Immature Granulocytes 0.05 0.00 - 0.07 K/uL    Comment: Performed at [facility] [address], [place], [place] [address]  Protime-INR     Status: None   Collection Time: 02/21/19  4:47 AM  Result Value Ref Range   Prothrombin Time 12.9 11.4 - 15.2 seconds   INR 1.0 0.8 - 1.2    Comment: (NOTE) INR goal varies based on device and disease states. Performed at [facility], [place] [address], [place], [place] [address]   Basic metabolic panel     Status: Abnormal   Collection Time: 02/21/19  4:47 AM  Result Value Ref Range   Sodium 132 (L) 135 - 145 mmol/L   Potassium 3.4 (L) 3.5 - 5.1 mmol/L   Chloride 96 (L) 98 - 111 mmol/L   CO2 25 22 - 32 mmol/L   Glucose, Bld 108 (H) 70 - 99 mg/dL   BUN 13 6 - 20 mg/dL   Creatinine, Ser 0.91 0.61 - 1.24 mg/dL   Calcium 8.8 (L) 8.9 - 10.3 mg/dL   GFR calc non Af Amer >60 >60 mL/min   GFR calc Af Amer >60 >60 mL/min   Anion gap 11 5 - 15    Comment: Performed at [facility], [place] [address] [place], [place] [address]   CT ABDOMEN WO CONTRAST  Result Date: 02/20/2019 CLINICAL DATA:  Laryngeal squamous carcinoma and evaluation for gastrostomy tube placement prior to starting therapy. EXAM: CT ABDOMEN WITHOUT CONTRAST TECHNIQUE: Multidetector CT imaging of the abdomen was performed following the standard protocol without IV contrast. COMPARISON:  PET scan on 01/27/2019 FINDINGS: Lower chest: Atelectasis and scarring at both lung bases. Hepatobiliary: No focal liver abnormality is seen. No gallstones, gallbladder wall  thickening, or biliary dilatation. Pancreas: Unremarkable. No pancreatic ductal dilatation or surrounding inflammatory changes. Spleen: Normal in size without focal abnormality. Adrenals/Urinary Tract: Adrenal glands are unremarkable. Kidneys are normal, without renal calculi, focal lesion, or hydronephrosis. Stomach/Bowel: No hiatal hernia. The stomach has a horizontal lie, is fairly deeply located and consistently is situated directly behind the transverse colon on both the current CT as well as the prior PET scan. This makes the patient a poor candidate for percutaneous gastrostomy even after gastric insufflation. There are some mildly dilated anterior small bowel loops inferior to the transverse colon suggestive of a component of mild ileus. Other small bowel loops are not dilated. No evidence of free air or abnormal fluid collection in the abdomen. Vascular/Lymphatic: Aortic atherosclerosis with calcified plaque present. The infrarenal aorta just below the IMA origin shows slight bulge measuring up to 2.6 cm in diameter.  This is not overtly aneurysmal. No enlarged abdominal or pelvic lymph nodes. Other:  No ascites or body wall edema. No abdominal wall hernia. Musculoskeletal: No acute or significant osseous findings. IMPRESSION: 1. The stomach has a horizontal lie directly behind the transverse colon on both the current CT and prior PET scan. This makes the patient a poor candidate for percutaneous gastrostomy even after gastric insufflation. Recommend surgical consultation for possible surgical gastrostomy versus jejunostomy. 2. There are some mildly dilated anterior small bowel loops inferior to the transverse colon suggestive of a component of mild ileus. No evidence of bowel obstruction, free air or abnormal fluid collection in the abdomen. 3. Aortic atherosclerosis with slight bulge of the infrarenal aorta measuring up to 2.6 cm in diameter. This is not overtly aneurysmal. Recommend follow-up abdominal  ultrasound in 3 years. This recommendation follows ACR consensus guidelines: White Paper of the ACR Incidental Findings Committee II on Vascular Findings. J Am Coll Radiol 2013; 10: Aortic Atherosclerosis (ICD10-I70.0). Electronically Signed   By: Aletta Edouard M.D.   On: 02/20/2019 08:43      Assessment/Plan Supraglottic posterior laryngeal squamous cell carcinoma Traumatic intubation during dental procedure requiring tracheostomy placement 02/18/2019 Dr.Su Benjamine Mola, & Dr., Teena Dunk Hx CVA -off Plavix Hypertension CAD Chronic back pain  Pain: We will review with Dr. Rosendo Gros and see when we can get him on the schedule, he is currently n.p.o. for PICC placement by IR which has been completed and is ready for use.   Earnstine Regal [facility] Surgery 02/21/2019, 11:10 AM Please see Amion for pager number during day hours 7:00am-4:30pm

## 2019-02-21 NOTE — Progress Notes (Signed)
POST OPERATIVE NOTE: Day #3  02/21/2019 [name] [id number]  VITALS: BP (!) 174/99   Pulse 84   Temp 99 F (37.2 C) (Axillary)   Resp 16   Ht 5\' 8"  (1.727 m)   Wt 83 kg   SpO2 94%   BMI 27.82 kg/m   LABS:  Lab Results  Component Value Date   WBC 8.7 02/21/2019   HGB 10.6 (L) 02/21/2019   HCT 31.7 (L) 02/21/2019   MCV 95.5 02/21/2019   PLT 249 02/21/2019   BMET    Component Value Date/Time   NA 132 (L) 02/21/2019 0447   K 3.4 (L) 02/21/2019 0447   CL 96 (L) 02/21/2019 0447   CO2 25 02/21/2019 0447   GLUCOSE 108 (H) 02/21/2019 0447   BUN 13 02/21/2019 0447   BUN 13 12/24/2018 0918   CREATININE 0.91 02/21/2019 0447   CREATININE 1.23 10/28/2012 1558   CALCIUM 8.8 (L) 02/21/2019 0447   GFRNONAA >60 02/21/2019 0447   GFRAA >60 02/21/2019 0447    Lab Results  Component Value Date   INR 1.0 02/21/2019   INR 1.0 08/02/2018   INR 0.99 11/05/2012   No results found for: PTT   [name] is status post extraction of remaining teeth with alveoloplasty and preprosthetic surgery as needed in the operating room and general anesthesia on 02/18/2019. Patient also had a tracheostomy procedure by Dr. Benjamine Mola on that same day.   SUBJECTIVE: Patient with minimal complaints. Extraction sites are sore by patient report. Patient denies any active bleeding.  EXAM: There is no sign of infection, heme, or ooze. Sutures are intact. Clots are present. There is  evidence of intraoral swelling and ecchymoses. The facial swelling has resolved significantly.  ASSESSMENT: Post operative course is consistent with dental procedures performed in the operating room with general anesthesia.. Loss of teeth due to extraction Intraoral swelling and ecchymosis is present. Patient is now edentulous. There is atrophy of the edentulous alveolar ridges. Leukocytosis has resolved.  PLAN: 1. IV Ancef was ordered due to presence of leukocytosis and reduce the risk for infection for the planned  Port-A-Cath and feeding tube placements. The leukocytosis has resolved and I will leave it up to the hospitalist team to determine when they wish to discontinue the IV antibiotics. It is okay to discontinue from a dental standpoint. 2. Chlorhexidine rinses twice daily after breakfast and at bedtime. Use salt water rinses every 2 hours while awake in between the chlorhexidine rinses. 3. Advance diet as tolerated with nutritional consultation assistance. 4. Port-A-Cath placement and feeding tube placement by IR and general surgery as indicated. 5. Patient is acceptable for discharge from a dental standpoint.   [name], [name]

## 2019-02-21 NOTE — Progress Notes (Signed)
PROGRESS NOTE    RODOLPHE EDMONSTON    Code Status: Full Code  TJQ:[id number] DOB: March 11, 1960 DOA: 02/18/2019 LOS: 3 days  PCP: [name], MD CC: No chief complaint on file.      Hospital Summary  JO CERONE is a 59 y.o. 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 on 2/16 for elective dental extractions prior to initiating chemotherapy and radiation for his newly diagnosed cancer however during anesthesia induction he was noted to have very difficult airway requiring multiple attempts before an ETT was placed.  Due to significant bleeding and trauma there was concern for airway protection and ENT was consulted who decided to proceed with cuffed tracheostomy after his dental procedure and in ICU for observation.  He was transferred out of the ICU in [facility] assumed care on 2/18.  He was since evaluated by SLP.  IR commended general surgery consult for G tube insertion due to abnormal gastric anatomy and IR to place Port-A-Cath in anticipation of starting chemotherapy.  Due to leukocytosis he was started on Ancef per dental and ENT team which improved and This was discontinued 2/19.   2/19 underwent IR guided RUE dual-lumen power PICC instead of Port-A-Cath for chemotherapy.  General surgery consulted for G-tube placement and recommended procedure on Monday.  A & P   Active Problems:   Malignant neoplasm of supraglottis (HCC)   Hx of tracheostomy   Head and neck cancer ([place])   1. Newly diagnosed supraglottic posterior laryngeal squamous cell carcinoma complicated by traumatic intubation during dental procedure, s/p tracheostomy placement, POD 3 a. 2/19 PICC placed by IR for anticipation of chemotherapy b. Neurosurgery consulted: G-tube placement on Monday c. Completed 4 days Ancef for concern of leukocytosis and  possible infection during procedure.  Discontinued 2/19 d. Copious secretions with report of blood around trach.  No leukocytosis, afebrile and has completed several days of antibiotics.  We will hold off on further antibiotics and continue to monitor for infection e. Morphine as needed f. Modified Barium Swallow study: no aspiration.  g. SLP: dys 1 diet h. Will add back on p.o. meds 2. Hypertension a. Add back on p.o. lisinopril and amlodipine meds 3. Low back pain a. Heating pad 4. Depression a. Related to his new onset medical issues b. Continue to provide reassurance and consider SSRI 5. CAD a. Restart statin and hold aspirin and Plavix due to upcoming procedure    DVT prophylaxis: SCDs Family Communication: Updated wife yesterday Disposition Plan:   Patient came from:  home                                                                                          Anticipated d/c place: hom  Barriers to d/c: Port a Cath, PEG placement, PO intake  Pressure injury documentation    None  Consultants  IR Dentistry ENT PCCM  Procedures  2/16 multiple dental extractions 2/16 tracheostomy placement  Antibiotics   Anti-infectives (From admission, onward)   Start  Dose/Rate Route Frequency Ordered Stop   02/19/19 1200  ceFAZolin (ANCEF) IVPB 2g/100 mL premix     2 g 200 mL/hr over 30 Minutes Intravenous Every 8 hours 02/19/19 1045     02/19/19 1045  ceFAZolin (ANCEF) IVPB 2g/100 mL premix  Status:  Discontinued     2 g 200 mL/hr over 30 Minutes Intravenous Every 8 hours 02/19/19 1032 02/19/19 1045   02/18/19 0615  ceFAZolin (ANCEF) IVPB 2g/100 mL premix     2 g 200 mL/hr over 30 Minutes Intravenous  Once 02/18/19 0610 02/18/19 0753   02/18/19 0613  ceFAZolin (ANCEF) 2-4 GM/100ML-% IVPB    Note to Pharmacy: Maryjean Ka   : cabinet override      02/18/19 3009 02/18/19 0801        Subjective   Notified by RRT the patient has had increased secretions with some  bleeding around tracheostomy site and surgery for possible infection.  He is understandably very frustrated with his recent onset significant medical issues and current clinical status.  Currently denies any shortness of breath, nausea, vomiting or chest pain.  Denies any other complaints at this time.  No overnight events.  Objective   Vitals:   02/21/19 0400 02/21/19 0500 02/21/19 0830 02/21/19 0927  BP: (!) 174/99     Pulse:   86   Resp:   20   Temp: 99 F (37.2 C)     TempSrc: Axillary   Axillary  SpO2: 94%  98%   Weight:  83 kg    Height:       No intake or output data in the 24 hours ending 02/21/19 1612 Filed Weights   02/19/19 0500 02/20/19 0500 02/21/19 0500  Weight: 81 kg 82.1 kg 83 kg    Examination:  Physical Exam Vitals and nursing note reviewed.  Constitutional:      Appearance: Normal appearance.  HENT:     Head: Normocephalic and atraumatic.  Eyes:     Conjunctiva/sclera: Conjunctivae normal.  Neck:     Comments: Tracheostomy without any blood or secretions currently Cardiovascular:     Rate and Rhythm: Normal rate and regular rhythm.  Pulmonary:     Effort: Pulmonary effort is normal. No respiratory distress.     Breath sounds: Normal breath sounds. No rales.  Abdominal:     General: Abdomen is flat.     Palpations: Abdomen is soft.  Musculoskeletal:        General: No swelling or tenderness.  Skin:    Coloration: Skin is not jaundiced or pale.  Neurological:     Mental Status: He is alert. Mental status is at baseline.  Psychiatric:        Mood and Affect: Mood is depressed.        Behavior: Behavior normal.     Data Reviewed: I have personally reviewed following labs and imaging studies  CBC: Recent Labs  Lab 02/17/19 1419 02/19/19 0419 02/20/19 0809 02/21/19 0447  WBC 7.5 12.1* 10.3 8.7  NEUTROABS  --   --   --  6.7  HGB 11.9* 11.5* 11.2* 10.6*  HCT 35.2* 33.3* 34.0* 31.7*  MCV 94.6 94.1 97.7 95.5  PLT 269 262 258 233   Basic  Metabolic Panel: Recent Labs  Lab 02/17/19 1419 02/19/19 0419 02/20/19 0809 02/21/19 0447  NA 132* 134* 135 132*  K 4.5 3.6 3.6 3.4*  CL 98 97* 98 96*  CO2 '23 24 26 25  '$ GLUCOSE 101* 110* 104* 108*  BUN '11 9 12 13  '$ CREATININE 0.99 0.97 1.03 0.91  CALCIUM 9.7 9.3 9.0 8.8*   GFR: Estimated Creatinine Clearance: 94 mL/min (by C-G formula based on SCr of 0.91 mg/dL). Liver Function Tests: Recent Labs  Lab 02/17/19 1419  AST 20  ALT 15  ALKPHOS 61  BILITOT 0.9  PROT 6.9  ALBUMIN 3.9   No results for input(s): LIPASE, AMYLASE in the last 168 hours. No results for input(s): AMMONIA in the last 168 hours. Coagulation Profile: Recent Labs  Lab 02/21/19 0447  INR 1.0   Cardiac Enzymes: No results for input(s): CKTOTAL, CKMB, CKMBINDEX, TROPONINI in the last 168 hours. BNP (last 3 results) No results for input(s): PROBNP in the last 8760 hours. HbA1C: No results for input(s): HGBA1C in the last 72 hours. CBG: No results for input(s): GLUCAP in the last 168 hours. Lipid Profile: No results for input(s): CHOL, HDL, LDLCALC, TRIG, CHOLHDL, LDLDIRECT in the last 72 hours. Thyroid Function Tests: No results for input(s): TSH, T4TOTAL, FREET4, T3FREE, THYROIDAB in the last 72 hours. Anemia Panel: No results for input(s): VITAMINB12, FOLATE, FERRITIN, TIBC, IRON, RETICCTPCT in the last 72 hours. Sepsis Labs: No results for input(s): PROCALCITON, LATICACIDVEN in the last 168 hours.  Recent Results (from the past 240 hour(s))  SARS CORONAVIRUS 2 (TAT 6-24 HRS) Nasopharyngeal Nasopharyngeal Swab     Status: None   Collection Time: 02/17/19  2:48 PM   Specimen: Nasopharyngeal Swab  Result Value Ref Range Status   SARS Coronavirus 2 NEGATIVE NEGATIVE Final    Comment: (NOTE) SARS-CoV-2 target nucleic acids are NOT DETECTED. The SARS-CoV-2 RNA is generally detectable in upper and lower respiratory specimens during the acute phase of infection. Negative results do not preclude  SARS-CoV-2 infection, do not rule out co-infections with other pathogens, and should not be used as the sole basis for treatment or other patient management decisions. Negative results must be combined with clinical observations, patient history, and epidemiological information. The expected result is Negative. Fact Sheet for Patients: SugarRoll.be Fact Sheet for Healthcare Providers: https://www.woods-mathews.com/ This test is not yet approved or cleared by the [place] FDA and  has been authorized for detection and/or diagnosis of SARS-CoV-2 by FDA under an Emergency Use Authorization (EUA). This EUA will remain  in effect (meaning this test can be used) for the duration of the COVID-19 declaration under Section 56 4(b)(1) of the Act, 21 U.S.C. section 360bbb-3(b)(1), unless the authorization is terminated or revoked sooner. Performed at [facility], [place] [address], [place], [place] [address]   MRSA PCR Screening     Status: None   Collection Time: 02/18/19  5:00 PM  Result Value Ref Range Status   MRSA by PCR NEGATIVE NEGATIVE Final    Comment:        The GeneXpert MRSA Assay (FDA approved for NASAL specimens only), is one component of a comprehensive MRSA colonization surveillance program. It is not intended to diagnose MRSA infection nor to guide or monitor treatment for MRSA infections. Performed at [facility], [place] [address] [place], [place] [address]   Culture, blood (Routine X 2) w Reflex to ID Panel     Status: None (Preliminary result)   Collection Time: 02/19/19 11:18 AM   Specimen: BLOOD  Result Value Ref Range Status   Specimen Description BLOOD RIGHT ANTECUBITAL  Final   Special Requests   Final    BOTTLES DRAWN AEROBIC AND ANAEROBIC Blood Culture results may not be optimal  due to an excessive volume of blood received in culture bottles   Culture   Final    NO GROWTH 2 DAYS Performed at [facility], [place] [address], [place], [place] [address]    Report Status PENDING  Incomplete  Culture, blood (Routine X 2) w Reflex to ID Panel     Status: None (Preliminary result)   Collection Time: 02/19/19 11:18 AM   Specimen: BLOOD RIGHT ARM  Result Value Ref Range Status   Specimen Description BLOOD RIGHT ARM  Final   Special Requests   Final    BOTTLES DRAWN AEROBIC AND ANAEROBIC Blood Culture results may not be optimal due to an excessive volume of blood received in culture bottles   Culture   Final    NO GROWTH 2 DAYS Performed at [facility], [place] [address] [place], [place] [address]    Report Status PENDING  Incomplete         Radiology Studies: CT ABDOMEN WO CONTRAST  Result Date: 02/20/2019 CLINICAL DATA:  Laryngeal squamous carcinoma and evaluation for gastrostomy tube placement prior to starting therapy. EXAM: CT ABDOMEN WITHOUT CONTRAST TECHNIQUE: Multidetector CT imaging of the abdomen was performed following the standard protocol without IV contrast. COMPARISON:  PET scan on 01/27/2019 FINDINGS: Lower chest: Atelectasis and scarring at both lung bases. Hepatobiliary: No focal liver abnormality is seen. No gallstones, gallbladder wall thickening, or biliary dilatation. Pancreas: Unremarkable. No pancreatic ductal dilatation or surrounding inflammatory changes. Spleen: Normal in size without focal abnormality. Adrenals/Urinary Tract: Adrenal glands are unremarkable. Kidneys are normal, without renal calculi, focal lesion, or hydronephrosis. Stomach/Bowel: No hiatal hernia. The stomach has a horizontal lie, is fairly deeply located and consistently is situated directly behind the transverse colon on both the current CT as well as the prior PET scan. This makes the patient a poor candidate for percutaneous gastrostomy even after gastric insufflation. There are some mildly dilated anterior small bowel loops inferior to the transverse colon suggestive of a component of mild  ileus. Other small bowel loops are not dilated. No evidence of free air or abnormal fluid collection in the abdomen. Vascular/Lymphatic: Aortic atherosclerosis with calcified plaque present. The infrarenal aorta just below the IMA origin shows slight bulge measuring up to 2.6 cm in diameter. This is not overtly aneurysmal. No enlarged abdominal or pelvic lymph nodes. Other:  No ascites or body wall edema. No abdominal wall hernia. Musculoskeletal: No acute or significant osseous findings. IMPRESSION: 1. The stomach has a horizontal lie directly behind the transverse colon on both the current CT and prior PET scan. This makes the patient a poor candidate for percutaneous gastrostomy even after gastric insufflation. Recommend surgical consultation for possible surgical gastrostomy versus jejunostomy. 2. There are some mildly dilated anterior small bowel loops inferior to the transverse colon suggestive of a component of mild ileus. No evidence of bowel obstruction, free air or abnormal fluid collection in the abdomen. 3. Aortic atherosclerosis with slight bulge of the infrarenal aorta measuring up to 2.6 cm in diameter. This is not overtly aneurysmal. Recommend follow-up abdominal ultrasound in 3 years. This recommendation follows ACR consensus guidelines: White Paper of the ACR Incidental Findings Committee II on Vascular Findings. J Am Coll Radiol 2013; 10: Aortic Atherosclerosis (ICD10-I70.0). Electronically Signed   By: Aletta Edouard M.D.   On: 02/20/2019 08:43   DG Swallowing Func-Speech Pathology  Result Date: 02/21/2019 Objective Swallowing Evaluation: Type of Study: MBS-Modified Barium Swallow Study  Patient Details Name:  HATCHER FRONING MRN: [id number] Date of Birth: August 10, 1960 Today's Date: 02/21/2019 Time: SLP Start Time (ACUTE ONLY): 1415 -SLP Stop Time (ACUTE ONLY): 1435 SLP Time Calculation (min) (ACUTE ONLY): 20 min Past Medical History: Past Medical History: Diagnosis Date . Anxiety  . Arthritis    back . Bradycardia   a. During 11/2012 adm: lopressor decreased. . Cancer of larynx ([place])  . Carotid artery stenosis  . 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 Coronary artery disease   a. Diag cath 10/2012 for CP/abnormal nuc -> planned PCI s/p LAD atherectomy/DES placement 11/05/12. Marland Kitchen GERD (gastroesophageal reflux disease)  . History of hiatal hernia  . Hypercholesteremia  . Hypertension  . Stroke ([place]) 07/31/2018  bilateral vertebrobasilar occlusion; "left side weaker thanit was before." Past Surgical History: Past Surgical History: Procedure Laterality Date . CARDIAC CATHETERIZATION  10/29/2012 . CORONARY ANGIOPLASTY WITH STENT PLACEMENT  11/05/2012 . DIRECT LARYNGOSCOPY N/A 01/02/2019  Procedure: MICRO DIRECT LARYNGOSCOPY BIOPSY OF LARYNGEAL MASS;  Surgeon: [name], MD;  Location: [facility];  Service: ENT;  Laterality: N/A; . HEMORRHOID SURGERY  ~ 2010 . INSERTION OF MESH N/A 06/02/2015  Procedure: INSERTION OF MESH;  Surgeon: [name], MD;  Location: [facility];  Service: General;  Laterality: N/A; . IR ANGIO INTRA EXTRACRAN SEL COM CAROTID INNOMINATE BILAT MOD SED  08/02/2018 . IR ANGIO VERTEBRAL SEL VERTEBRAL BILAT MOD SED  08/02/2018 . LEFT HEART CATHETERIZATION WITH CORONARY ANGIOGRAM N/A 10/30/2012  Procedure: LEFT HEART CATHETERIZATION WITH CORONARY ANGIOGRAM;  Surgeon: [name], MD;  Location: [facility] C[facility];  Service: Cardiovascular;  Laterality: N/A; . MULTIPLE EXTRACTIONS WITH ALVEOLOPLASTY N/A 02/18/2019  Procedure: Extraction of tooth #'s 5-7, 10,11,14,20-29, 31 and 32 with alveoloplasty and maxiillary left buccal exostoses reductions;  Surgeon: [name], [name];  Location: [facility];  Service: Oral Surgery;  Laterality: N/A; . PERCUTANEOUS CORONARY ROTOBLATOR INTERVENTION (PCI-R) N/A 11/05/2012  Procedure: PERCUTANEOUS CORONARY ROTOBLATOR INTERVENTION (PCI-R);  Surgeon: [name], MD;  Location: [facility] CATH LAB;   Service: Cardiovascular;  Laterality: N/A; . TRACHEOSTOMY TUBE PLACEMENT N/A 02/18/2019  Procedure: Tracheostomy;  Surgeon: [name], MD;  Location: [facility];  Service: ENT;  Laterality: N/A; . 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 Hx of Anxiety,  During induction airway was quite difficult with friable tissue requiring several attempts before airway cannulated.  Dental extraction was completed.  It was decided to proceed with tracheostomy for airway protection given concern for airway obstruction. Had outpatient BSE on 02/05/19, recommended thin liquids and regular diet.  Subjective: alert, cooperative Assessment / Plan / Recommendation CHL IP CLINICAL IMPRESSIONS 02/21/2019 Clinical Impression Patient presents with a mild pharyngeal phase dysphagia characterized by mildly decreased appearing hyolaryngeal elevation and excursion,  which in combination of presence of diagnosed mass,  is resulting in mild post swallow vallecular and pyriform sinus residuals, which clear with second swallow. Patient able to protect airway with no penetration or aspiration observed. Declined soft solids due to missing dentition and pain post dental extractions.  As pain subsides, suspect that he will be able to swallow advanced solids safely.  Declined pill. Will  initiate a po diet and f/u at bedside. Note that PMV in place for evaluation with patient tolerating well.  SLP Visit Diagnosis Dysphagia, pharyngeal phase (R13.13) Attention and concentration deficit following -- Frontal lobe and executive function deficit following -- Impact on safety and function Mild aspiration risk   CHL  IP TREATMENT RECOMMENDATION 02/21/2019 Treatment Recommendations Therapy as outlined in treatment plan below   Prognosis 02/21/2019 Prognosis for Safe Diet Advancement Good Barriers to Reach Goals -- Barriers/Prognosis Comment -- CHL IP DIET RECOMMENDATION 02/21/2019 SLP Diet Recommendations Dysphagia 1 (Puree) solids;Thin liquid Liquid Administration via Cup;Straw Medication Administration Crushed with puree Compensations Slow rate;Small sips/bites;Multiple dry swallows after each bite/sip Postural Changes Seated upright at 90 degrees   CHL IP OTHER RECOMMENDATIONS 02/21/2019 Recommended Consults -- Oral Care Recommendations Oral care BID Other Recommendations Place PMSV during PO intake   CHL IP FOLLOW UP RECOMMENDATIONS 02/21/2019 Follow up Recommendations Outpatient SLP   CHL IP FREQUENCY AND DURATION 02/21/2019 Speech Therapy Frequency (ACUTE ONLY) min 2x/week Treatment Duration 2 weeks      CHL IP ORAL PHASE 02/21/2019 Oral Phase WFL Oral - Pudding Teaspoon -- Oral - Pudding Cup -- Oral - Honey Teaspoon -- Oral - Honey Cup -- Oral - Nectar Teaspoon -- Oral - Nectar Cup -- Oral - Nectar Straw -- Oral - Thin Teaspoon -- Oral - Thin Cup -- Oral - Thin Straw -- Oral - Puree -- Oral - Mech Soft -- Oral - Regular -- Oral - Multi-Consistency -- Oral - Pill -- Oral Phase - Comment --  CHL IP PHARYNGEAL PHASE 02/21/2019 Pharyngeal Phase Impaired Pharyngeal- Pudding Teaspoon -- Pharyngeal -- Pharyngeal- Pudding Cup -- Pharyngeal -- Pharyngeal- Honey Teaspoon -- Pharyngeal -- Pharyngeal- Honey Cup -- Pharyngeal -- Pharyngeal- Nectar Teaspoon -- Pharyngeal -- Pharyngeal- Nectar Cup -- Pharyngeal -- Pharyngeal- Nectar Straw -- Pharyngeal -- Pharyngeal- Thin Teaspoon -- Pharyngeal -- Pharyngeal- Thin Cup Pharyngeal residue - valleculae;Pharyngeal residue - pyriform;Reduced anterior laryngeal mobility;Reduced laryngeal elevation Pharyngeal -- Pharyngeal- Thin Straw Pharyngeal residue - valleculae;Pharyngeal residue -  pyriform;Reduced anterior laryngeal mobility;Reduced laryngeal elevation Pharyngeal -- Pharyngeal- Puree Pharyngeal residue - valleculae;Pharyngeal residue - pyriform;Reduced anterior laryngeal mobility;Reduced laryngeal elevation Pharyngeal -- Pharyngeal- Mechanical Soft -- Pharyngeal -- Pharyngeal- Regular -- Pharyngeal -- Pharyngeal- Multi-consistency -- Pharyngeal -- Pharyngeal- Pill -- Pharyngeal -- Pharyngeal Comment --  Gabriel Rainwater MA, CCC-SLP [place] Leah Meryl 02/21/2019, 3:56 PM              IR PICC PLACEMENT RIGHT >5 YRS INC IMG GUIDE  Result Date: 02/21/2019 INDICATION: Laryngeal cancer, access for initiation of chemotherapy EXAM: ULTRASOUND AND FLUOROSCOPIC GUIDED PICC LINE INSERTION MEDICATIONS: 1% lidocaine local CONTRAST:  None FLUOROSCOPY TIME:  Twelve seconds (1 mGy) COMPLICATIONS: None immediate. TECHNIQUE: The procedure, risks, benefits, and alternatives were explained to the patient and informed written consent was obtained. A timeout was performed prior to the initiation of the procedure. The right upper extremity was prepped with chlorhexidine in a sterile fashion, and a sterile drape was applied covering the operative field. Maximum barrier sterile technique with sterile gowns and gloves were used for the procedure. A timeout was performed prior to the initiation of the procedure. Local anesthesia was provided with 1% lidocaine. Under direct ultrasound guidance, the right basilic vein was accessed with a micropuncture kit after the overlying soft tissues were anesthetized with 1% lidocaine. An ultrasound image was saved for documentation purposes. A guidewire was advanced to the level of the superior caval-atrial junction for measurement purposes and the  PICC line was cut to length. A peel-away sheath was placed and a 37 cm, 5 [place], dual lumen was inserted to level of the superior caval-atrial junction. A post procedure spot fluoroscopic was obtained. The catheter easily aspirated and  flushed and was sutured in place. A dressing was placed. The patient tolerated the procedure well without immediate post procedural complication. FINDINGS: After catheter placement, the tip lies within the superior cavoatrial junction. The catheter aspirates and flushes normally and is ready for immediate use. IMPRESSION: Successful ultrasound and fluoroscopic guided placement of a right basilic vein approach, 37 cm, 5 French, dual lumen PICC with tip at the superior caval-atrial junction. The PICC line is ready for immediate use. Electronically Signed   By: Jerilynn Mages.  Shick M.D.   On: 02/21/2019 11:25        Scheduled Meds: . chlorhexidine  15 mL Mouth Rinse BID  . Chlorhexidine Gluconate Cloth  6 each Topical Q0600  . heparin lock flush      .  HYDROmorphone (DILAUDID) injection  0.5 mg Intravenous Once  . lidocaine-EPINEPHrine      . pantoprazole (PROTONIX) IV  40 mg Intravenous QHS   Continuous Infusions: .  ceFAZolin (ANCEF) IV 2 g (02/21/19 1142)  . lactated ringers    . lactated ringers 75 mL/hr at 02/19/19 1400  . sodium chloride irrigation       Time spent: 27 minutes with over 50% of the time coordinating the patient's care    Harold Hedge, DO [facility] Pager [phone]  Call night coverage person covering after 7pm

## 2019-02-21 NOTE — Progress Notes (Signed)
I spoke with Dr. Reesa Chew in IR today.  He states that patient has copious amounts of secretions from trach and there is a risk for the new port site to become infected.  He opted to place PICC line for now that we at [facility] can manage outpatient with flushes and dressing change. He can come back and have port placed outpatient once everything heals.    He also states that his colon is anterior and over his stomach, so there is a risk of doing percutaneous feeding tube placement.  He is deferring to general surgery.  I will update Dr. Arnoldo Morale and arrange the feeding tube placement with his office one day next week.    I have reached out to Dr. Arnoldo Morale with general surgery at [facility] and he would be able to do the surgery but it is going to take 10-14 business days before he could get insurance authorization. He suggests for patient to be seen inpatient by general surgery while he is at [facility] and have one of them place the feeding tube.    I spoke with Dr. Neysa Bonito, hospitalist at [facility] and he has placed order for general surgery consult.  They will manage patient from this point.

## 2019-02-21 NOTE — Progress Notes (Signed)
SLP Cancellation Note  Patient Details Name: [name] MRN: [id number] DOB: February 16, 1960   Cancelled treatment:       Reason Eval/Treat Not Completed: Medical issues which prohibited therapy - currently NPO pending procedure in IR. Talked with RN who is unsure of the timeline for when procedure will occur, but will reach out to SLP if MBS can be completed later today. Will f/u for MBS as can be completed.    Osie Bond., M.A. [facility] Pager [phone] Office [phone]  02/21/2019, 8:27 AM

## 2019-02-21 NOTE — Progress Notes (Signed)
Modified Barium Swallow Progress Note  Patient Details  Name: [name] MRN: [id number] Date of Birth: [birth year]  Today's Date: 02/21/2019  Modified Barium Swallow completed.  Full report located under Chart Review in the Imaging Section.  Brief recommendations include the following:  Clinical Impression  Patient presents with a mild pharyngeal phase dysphagia characterized by mildly decreased appearing hyolaryngeal elevation and excursion,  which in combination of presence of diagnosed mass,  is resulting in mild post swallow vallecular and pyriform sinus residuals, which clear with second swallow. Patient able to protect airway with no penetration or aspiration observed. Declined soft solids due to missing dentition and pain post dental extractions.  As pain subsides, suspect that he will be able to swallow advanced solids safely.  Declined pill. Will initiate a po diet and f/u at bedside. Note that PMV in place for evaluation with patient tolerating well.    Swallow Evaluation Recommendations       SLP Diet Recommendations: Dysphagia 1 (Puree) solids;Thin liquid   Liquid Administration via: Cup;Straw   Medication Administration: Crushed with puree   Supervision: Patient able to self feed;Full supervision/cueing for compensatory strategies   Compensations: Slow rate;Small sips/bites;Multiple dry swallows after each bite/sip   Postural Changes: Seated upright at 90 degrees   Oral Care Recommendations: Oral care BID   Other Recommendations: Place PMSV during PO intake  Refael Fulop MA, CCC-SLP    [name] 02/21/2019,3:56 PM

## 2019-02-21 NOTE — Progress Notes (Signed)
Subjective: Resting comfortably in bed. No new issues.  Objective: Vital signs in last 24 hours: Temp:  [99 F (37.2 C)] 99 F (37.2 C) (02/19 0400) Pulse Rate:  [86] 86 (02/19 0830) Resp:  [20] 20 (02/19 0830) BP: (167-174)/(95-99) 174/99 (02/19 0400) SpO2:  [94 %-98 %] 98 % (02/19 0830) FiO2 (%):  [28 %] 28 % (02/19 0830) Weight:  [83 kg] 83 kg (02/19 0500)  Physical Exam: General:Alert and responsive. NAD. Head: Normocephalic, no evidence injury, no tenderness, facial buttresses intact without stepoff.  Eyes: PERRL, EOMI. No scleral icterus, conjunctivae clear.  Nose: Normal skin and external support. Anterior rhinoscopy reveals healthy pink mucosa over the septum and turbinates.  Oral cavity:s/p dental extractions. Improving facial edema. Neck: Trach in place. Breathing well via trach. No bleeding.   Recent Labs    02/20/19 0809 02/21/19 0447  WBC 10.3 8.7  HGB 11.2* 10.6*  HCT 34.0* 31.7*  PLT 258 249   Recent Labs    02/20/19 0809 02/21/19 0447  NA 135 132*  K 3.6 3.4*  CL 98 96*  CO2 26 25  GLUCOSE 104* 108*  BUN 12 13  CREATININE 1.03 0.91  CALCIUM 9.0 8.8*    Medications:  I have reviewed the patient's current medications. Scheduled: . amLODipine  5 mg Oral Daily  . atorvastatin  80 mg Oral QPM  . chlorhexidine  15 mL Mouth Rinse BID  . Chlorhexidine Gluconate Cloth  6 each Topical Q0600  . heparin lock flush      .  HYDROmorphone (DILAUDID) injection  0.5 mg Intravenous Once  . lidocaine-EPINEPHrine      . lisinopril  20 mg Oral Daily  . pantoprazole (PROTONIX) IV  40 mg Intravenous QHS   Continuous: . lactated ringers    . lactated ringers 75 mL/hr at 02/19/19 1400  . sodium chloride irrigation      Assessment/Plan: POD #3 s/p trach and dental extractions. - On trach collar. Lurline Idol care teaching. Will change trach early next week. - ChemoXRT per medical and radiation oncology. - Plan G-tube placement next week.    LOS: 3 days    Jannet Calip W Jeri Rawlins 02/21/2019, 8:32 PM

## 2019-02-22 LAB — CBC
HCT: 32.4 % — ABNORMAL LOW (ref 39.0–52.0)
Hemoglobin: 11.1 g/dL — ABNORMAL LOW (ref 13.0–17.0)
MCH: 32.4 pg (ref 26.0–34.0)
MCHC: 34.3 g/dL (ref 30.0–36.0)
MCV: 94.5 fL (ref 80.0–100.0)
Platelets: 280 10*3/uL (ref 150–400)
RBC: 3.43 MIL/uL — ABNORMAL LOW (ref 4.22–5.81)
RDW: 12.9 % (ref 11.5–15.5)
WBC: 9.4 10*3/uL (ref 4.0–10.5)
nRBC: 0 % (ref 0.0–0.2)

## 2019-02-22 LAB — BASIC METABOLIC PANEL
Anion gap: 13 (ref 5–15)
BUN: 12 mg/dL (ref 6–20)
CO2: 26 mmol/L (ref 22–32)
Calcium: 8.7 mg/dL — ABNORMAL LOW (ref 8.9–10.3)
Chloride: 95 mmol/L — ABNORMAL LOW (ref 98–111)
Creatinine, Ser: 0.89 mg/dL (ref 0.61–1.24)
GFR calc Af Amer: >60 mL/min (ref >60)
GFR calc non Af Amer: >60 mL/min (ref >60)
Glucose, Bld: 109 mg/dL — ABNORMAL HIGH (ref 70–99)
Potassium: 3.6 mmol/L (ref 3.5–5.1)
Sodium: 134 mmol/L — ABNORMAL LOW (ref 135–145)

## 2019-02-22 MED ORDER — ENSURE ENLIVE PO LIQD
237.0000 mL | Freq: Three times a day (TID) | ORAL | Status: DC
  Administered 2019-02-22 – 2019-02-23 (×2): 237 mL via ORAL

## 2019-02-22 NOTE — Progress Notes (Addendum)
PROGRESS NOTE    [name]    Code Status: Full Code  BWL:[id number] DOB: 1960/04/28 DOA: 02/18/2019 LOS: 4 days  PCP: [name], MD CC: No chief complaint on file.      Hospital Summary  [name] is a 58 y.o. male with a history of recently diagnosed laryngeal squamous cell carcinoma, anxiety, bradycardia, carotid artery stenosis, chronic low back pain, GERD, stroke in 2020 with residual left-sided weakness who presented to [facility] on 2/16 for elective dental extractions prior to initiating chemotherapy and radiation for his newly diagnosed cancer however during anesthesia induction he was noted to have very difficult airway requiring multiple attempts before an ETT was placed.  Due to significant bleeding and trauma there was concern for airway protection and ENT was consulted who decided to proceed with cuffed tracheostomy after his dental procedure and in ICU for observation.  He was transferred out of the ICU in [facility] assumed care on 2/18.  He was since evaluated by SLP.  IR commended general surgery consult for G tube insertion due to abnormal gastric anatomy and IR to place Port-A-Cath in anticipation of starting chemotherapy.  Due to leukocytosis he was started on Ancef per dental and ENT team which improved and This was discontinued 2/19.   2/19 underwent IR guided RUE dual-lumen power PICC instead of Port-A-Cath for chemotherapy.  General surgery consulted for G-tube placement and recommended procedure on Monday.  A & P   Active Problems:   Malignant neoplasm of supraglottis (HCC)   Hx of tracheostomy   Head and neck cancer ([place])   Newly diagnosed supraglottic posterior laryngeal squamous cell carcinoma complicated by traumatic intubation during dental procedure, s/p tracheostomy placement, POD 4 2/19 PICC placed by IR for anticipation of chemotherapy General surgery consulted: G-tube placement on Monday Completed 4 days Ancef for concern of leukocytosis and possible  infection during procedure.  Discontinued 2/19 Copious trach secretions with report of blood around trach by RRT 2/19, unlikely infectious, more likely due to chronic issue. 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 Also tobacco use from age 60, quit 11 years go.  Needs oncology follow-up hold off on further antibiotics and continue to monitor for infection Morphine as needed Modified Barium Swallow study: no aspiration.  SLP: dys 1 diet Continue p.o. meds Follow-up with oncology outpatient Hypertension Continue lisinopril and amlodipine  Low back pain Heating pad Depression Related to his new onset medical issues Appreciate Chaplain support Continue to provide reassurance and consider SSRI Compensated HFpEF CAD Restart statin and hold aspirin and Plavix due to upcoming procedure    DVT prophylaxis: SCDs Family Communication: Wife updated Disposition Plan:  Patient came from:  home                                                                                         Anticipated d/c place: home Barriers to d/c: G tube placement Monday  Pressure injury documentation    None  Consultants  IR Dentistry ENT PCCM  Procedures  2/16 multiple dental extractions 2/16 tracheostomy placement  Antibiotics  Anti-infectives (From admission, onward)    Start     Dose/Rate Route Frequency Ordered Stop   02/19/19 1200  ceFAZolin (ANCEF) IVPB 2g/100 mL premix  Status:  Discontinued     2 g 200 mL/hr over 30 Minutes Intravenous Every 8 hours 02/19/19 1045 02/21/19 1638   02/19/19 1045  ceFAZolin (ANCEF) IVPB 2g/100 mL premix  Status:  Discontinued     2 g 200 mL/hr over 30 Minutes Intravenous Every 8 hours 02/19/19 1032 02/19/19 1045   02/18/19 0615  ceFAZolin (ANCEF) IVPB 2g/100 mL premix     2 g 200 mL/hr over 30 Minutes Intravenous  Once 02/18/19 0610 02/18/19 0753   02/18/19 0613  ceFAZolin (ANCEF) 2-4  GM/100ML-% IVPB    Note to Pharmacy: Maryjean Ka   : cabinet override      02/18/19 0613 02/18/19 0801         Subjective   No overnight events, no complaints at this time.  Patient states that he started smoking at age 75 and quit in 2010.  He reports that he has had asbestos exposure and is concerned that he has mesothelioma.  Objective   Vitals:   02/22/19 0417 02/22/19 0812 02/22/19 0832 02/22/19 1236  BP:   (!) 160/93   Pulse: 70 82  (!) 105  Resp: '18 17 16 19  '$ Temp:   98 F (36.7 C)   TempSrc:   Oral   SpO2: 94% 98% 95% 98%  Weight:      Height:       No intake or output data in the 24 hours ending 02/22/19 1403 Filed Weights   02/19/19 0500 02/20/19 0500 02/21/19 0500  Weight: 81 kg 82.1 kg 83 kg    Examination:  Physical Exam Vitals and nursing note reviewed.  Constitutional:      Appearance: Normal appearance.  HENT:     Head: Normocephalic and atraumatic.  Eyes:     Conjunctiva/sclera: Conjunctivae normal.  Cardiovascular:     Rate and Rhythm: Normal rate and regular rhythm.  Pulmonary:     Effort: Pulmonary effort is normal.     Breath sounds: Normal breath sounds.     Comments: Secretions from trach tube Abdominal:     General: Abdomen is flat.     Palpations: Abdomen is soft.  Musculoskeletal:        General: No swelling or tenderness.  Skin:    Coloration: Skin is not jaundiced or pale.  Neurological:     Mental Status: He is alert. Mental status is at baseline.  Psychiatric:        Behavior: Behavior normal.     Data Reviewed: I have personally reviewed following labs and imaging studies  CBC: Recent Labs  Lab 02/17/19 1419 02/19/19 0419 02/20/19 0809 02/21/19 0447 02/22/19 0226  WBC 7.5 12.1* 10.3 8.7 9.4  NEUTROABS  --   --   --  6.7  --   HGB 11.9* 11.5* 11.2* 10.6* 11.1*  HCT 35.2* 33.3* 34.0* 31.7* 32.4*  MCV 94.6 94.1 97.7 95.5 94.5  PLT 269 262 258 249 209   Basic Metabolic Panel: Recent Labs  Lab 02/17/19 1419  02/19/19 0419 02/20/19 0809 02/21/19 0447 02/22/19 0226  NA 132* 134* 135 132* 134*  K 4.5 3.6 3.6 3.4* 3.6  CL 98 97* 98 96* 95*  CO2 '23 24 26 25 26  '$ GLUCOSE 101* 110* 104* 108* 109*  BUN '11 9 12 13 12  '$ CREATININE 0.99 0.97 1.03 0.91  0.89  CALCIUM 9.7 9.3 9.0 8.8* 8.7*   GFR: Estimated Creatinine Clearance: 96.1 mL/min (by C-G formula based on SCr of 0.89 mg/dL). Liver Function Tests: Recent Labs  Lab 02/17/19 1419  AST 20  ALT 15  ALKPHOS 61  BILITOT 0.9  PROT 6.9  ALBUMIN 3.9   No results for input(s): LIPASE, AMYLASE in the last 168 hours. No results for input(s): AMMONIA in the last 168 hours. Coagulation Profile: Recent Labs  Lab 02/21/19 0447  INR 1.0   Cardiac Enzymes: No results for input(s): CKTOTAL, CKMB, CKMBINDEX, TROPONINI in the last 168 hours. BNP (last 3 results) No results for input(s): PROBNP in the last 8760 hours. HbA1C: No results for input(s): HGBA1C in the last 72 hours. CBG: No results for input(s): GLUCAP in the last 168 hours. Lipid Profile: No results for input(s): CHOL, HDL, LDLCALC, TRIG, CHOLHDL, LDLDIRECT in the last 72 hours. Thyroid Function Tests: No results for input(s): TSH, T4TOTAL, FREET4, T3FREE, THYROIDAB in the last 72 hours. Anemia Panel: No results for input(s): VITAMINB12, FOLATE, FERRITIN, TIBC, IRON, RETICCTPCT in the last 72 hours. Sepsis Labs: No results for input(s): PROCALCITON, LATICACIDVEN in the last 168 hours.  Recent Results (from the past 240 hour(s))  SARS CORONAVIRUS 2 (TAT 6-24 HRS) Nasopharyngeal Nasopharyngeal Swab     Status: None   Collection Time: 02/17/19  2:48 PM   Specimen: Nasopharyngeal Swab  Result Value Ref Range Status   SARS Coronavirus 2 NEGATIVE NEGATIVE Final    Comment: (NOTE) SARS-CoV-2 target nucleic acids are NOT DETECTED. The SARS-CoV-2 RNA is generally detectable in upper and lower respiratory specimens during the acute phase of infection. Negative results do not preclude  SARS-CoV-2 infection, do not rule out co-infections with other pathogens, and should not be used as the sole basis for treatment or other patient management decisions. Negative results must be combined with clinical observations, patient history, and epidemiological information. The expected result is Negative. Fact Sheet for Patients: SugarRoll.be Fact Sheet for Healthcare Providers: https://www.woods-mathews.com/ This test is not yet approved or cleared by the [place] FDA and  has been authorized for detection and/or diagnosis of SARS-CoV-2 by FDA under an Emergency Use Authorization (EUA). This EUA will remain  in effect (meaning this test can be used) for the duration of the COVID-19 declaration under Section 56 4(b)(1) of the Act, 21 U.S.C. section 360bbb-3(b)(1), unless the authorization is terminated or revoked sooner. Performed at [facility], [place] [address], [place], [place] [address]   MRSA PCR Screening     Status: None   Collection Time: 02/18/19  5:00 PM  Result Value Ref Range Status   MRSA by PCR NEGATIVE NEGATIVE Final    Comment:        The GeneXpert MRSA Assay (FDA approved for NASAL specimens only), is one component of a comprehensive MRSA colonization surveillance program. It is not intended to diagnose MRSA infection nor to guide or monitor treatment for MRSA infections. Performed at [facility], [place] [address] [place],  [address]   Culture, blood (Routine X 2) w Reflex to ID Panel     Status: None (Preliminary result)   Collection Time: 02/19/19 11:18 AM   Specimen: BLOOD  Result Value Ref Range Status   Specimen Description BLOOD RIGHT ANTECUBITAL  Final   Special Requests   Final    BOTTLES DRAWN AEROBIC AND ANAEROBIC Blood Culture results may not be optimal due to an excessive volume of blood received in  culture bottles Performed at [facility], [place] [address],  [place], [place] [address]    Culture NO GROWTH 3 DAYS  Final   Report Status PENDING  Incomplete  Culture, blood (Routine X 2) w Reflex to ID Panel     Status: None (Preliminary result)   Collection Time: 02/19/19 11:18 AM   Specimen: BLOOD RIGHT ARM  Result Value Ref Range Status   Specimen Description BLOOD RIGHT ARM  Final   Special Requests   Final    BOTTLES DRAWN AEROBIC AND ANAEROBIC Blood Culture results may not be optimal due to an excessive volume of blood received in culture bottles Performed at [facility], [place] [address] [place], [place] [address]    Culture NO GROWTH 3 DAYS  Final   Report Status PENDING  Incomplete         Radiology Studies: DG Swallowing Func-Speech Pathology  Result Date: 02/21/2019 Objective Swallowing Evaluation: Type of Study: MBS-Modified Barium Swallow Study  Patient Details Name: [name] MRN: [id number] Date of Birth: 15-Jan-1960 Today's Date: 02/21/2019 Time: SLP Start Time (ACUTE ONLY): 1415 -SLP Stop Time (ACUTE ONLY): 1435 SLP Time Calculation (min) (ACUTE ONLY): 20 min Past Medical History: Past Medical History: Diagnosis Date  Anxiety   Arthritis   back  Bradycardia   a. During 11/2012 adm: lopressor decreased.  Cancer of larynx ([place])   Carotid artery stenosis   Chronic lower back pain   "I work Architect; messed back up ~ 30 yr ago; paralyzed for 2 days then" (11/05/2012)  Coronary artery disease   a. Diag cath 10/2012 for CP/abnormal nuc -> planned PCI s/p LAD atherectomy/DES placement 11/05/12.  GERD (gastroesophageal reflux disease)   History of hiatal hernia   Hypercholesteremia   Hypertension   Stroke ([place]) 07/31/2018  bilateral vertebrobasilar occlusion; "left side weaker thanit was before." Past Surgical History: Past Surgical History: Procedure Laterality Date  CARDIAC CATHETERIZATION  10/29/2012  CORONARY ANGIOPLASTY WITH STENT PLACEMENT  11/05/2012  DIRECT LARYNGOSCOPY N/A 01/02/2019  Procedure: MICRO DIRECT LARYNGOSCOPY BIOPSY OF  LARYNGEAL MASS;  Surgeon: [name], MD;  Location: [place] [facility];  Service: ENT;  Laterality: N/A;  HEMORRHOID SURGERY  ~ 2010  INSERTION OF MESH N/A 06/02/2015  Procedure: INSERTION OF MESH;  Surgeon: [name], MD;  Location: [facility];  Service: General;  Laterality: N/A;  IR ANGIO INTRA EXTRACRAN SEL COM CAROTID INNOMINATE BILAT MOD SED  08/02/2018  IR ANGIO VERTEBRAL SEL VERTEBRAL BILAT MOD SED  08/02/2018  LEFT HEART CATHETERIZATION WITH CORONARY ANGIOGRAM N/A 10/30/2012  Procedure: LEFT HEART CATHETERIZATION WITH CORONARY ANGIOGRAM;  Surgeon: [name], MD;  Location: [facility] CATH LAB;  Service: Cardiovascular;  Laterality: N/A;  MULTIPLE EXTRACTIONS WITH ALVEOLOPLASTY N/A 02/18/2019  Procedure: Extraction of tooth #'s 5-7, 10,11,14,20-29, 31 and 32 with alveoloplasty and maxiillary left buccal exostoses reductions;  Surgeon: [name], [name];  Location: [facility];  Service: Oral Surgery;  Laterality: N/A;  PERCUTANEOUS CORONARY ROTOBLATOR INTERVENTION (PCI-R) N/A 11/05/2012  Procedure: PERCUTANEOUS CORONARY ROTOBLATOR INTERVENTION (PCI-R);  Surgeon: [name], MD;  Location: [facility] CATH LAB;  Service: Cardiovascular;  Laterality: N/A;  TRACHEOSTOMY TUBE PLACEMENT N/A 02/18/2019  Procedure: Tracheostomy;  Surgeon: [name], MD;  Location: [place];  Service: ENT;  Laterality: N/A;  UMBILICAL HERNIA REPAIR N/A 06/02/2015  Procedure: UMBILICAL HERNIORRHAPHY WITH MESH;  Surgeon: [name], MD;  Location: [facility];  Service: General;  Laterality: N/A; HPI: 59 year old male patient who presented to [name] for  elective multiple dental extractions prior to initiating chemo and radiation therapy to newly diagnosed supraglottic posterior laryngeal squamous cell carcinoma on 2/16. Hx of Anxiety,  During induction airway was quite difficult with friable tissue requiring several attempts before airway cannulated.  Dental extraction was completed.  It was decided to proceed with tracheostomy for airway protection given concern  for airway obstruction. Had outpatient BSE on 02/05/19, recommended thin liquids and regular diet.  Subjective: alert, cooperative Assessment / Plan / Recommendation CHL IP CLINICAL IMPRESSIONS 02/21/2019 Clinical Impression Patient presents with a mild pharyngeal phase dysphagia characterized by mildly decreased appearing hyolaryngeal elevation and excursion,  which in combination of presence of diagnosed mass,  is resulting in mild post swallow vallecular and pyriform sinus residuals, which clear with second swallow. Patient able to protect airway with no penetration or aspiration observed. Declined soft solids due to missing dentition and pain post dental extractions.  As pain subsides, suspect that he will be able to swallow advanced solids safely.  Declined pill. Will initiate a po diet and f/u at bedside. Note that PMV in place for evaluation with patient tolerating well.  SLP Visit Diagnosis Dysphagia, pharyngeal phase (R13.13) Attention and concentration deficit following -- Frontal lobe and executive function deficit following -- Impact on safety and function Mild aspiration risk   CHL IP TREATMENT RECOMMENDATION 02/21/2019 Treatment Recommendations Therapy as outlined in treatment plan below   Prognosis 02/21/2019 Prognosis for Safe Diet Advancement Good Barriers to Reach Goals -- Barriers/Prognosis Comment -- CHL IP DIET RECOMMENDATION 02/21/2019 SLP Diet Recommendations Dysphagia 1 (Puree) solids;Thin liquid Liquid Administration via Cup;Straw Medication Administration Crushed with puree Compensations Slow rate;Small sips/bites;Multiple dry swallows after each bite/sip Postural Changes Seated upright at 90 degrees   CHL IP OTHER RECOMMENDATIONS 02/21/2019 Recommended Consults -- Oral Care Recommendations Oral care BID Other Recommendations Place PMSV during PO intake   CHL IP FOLLOW UP RECOMMENDATIONS 02/21/2019 Follow up Recommendations Outpatient SLP   CHL IP FREQUENCY AND DURATION 02/21/2019 Speech Therapy  Frequency (ACUTE ONLY) min 2x/week Treatment Duration 2 weeks      CHL IP ORAL PHASE 02/21/2019 Oral Phase WFL Oral - Pudding Teaspoon -- Oral - Pudding Cup -- Oral - Honey Teaspoon -- Oral - Honey Cup -- Oral - Nectar Teaspoon -- Oral - Nectar Cup -- Oral - Nectar Straw -- Oral - Thin Teaspoon -- Oral - Thin Cup -- Oral - Thin Straw -- Oral - Puree -- Oral - Mech Soft -- Oral - Regular -- Oral - Multi-Consistency -- Oral - Pill -- Oral Phase - Comment --  CHL IP PHARYNGEAL PHASE 02/21/2019 Pharyngeal Phase Impaired Pharyngeal- Pudding Teaspoon -- Pharyngeal -- Pharyngeal- Pudding Cup -- Pharyngeal -- Pharyngeal- Honey Teaspoon -- Pharyngeal -- Pharyngeal- Honey Cup -- Pharyngeal -- Pharyngeal- Nectar Teaspoon -- Pharyngeal -- Pharyngeal- Nectar Cup -- Pharyngeal -- Pharyngeal- Nectar Straw -- Pharyngeal -- Pharyngeal- Thin Teaspoon -- Pharyngeal -- Pharyngeal- Thin Cup Pharyngeal residue - valleculae;Pharyngeal residue - pyriform;Reduced anterior laryngeal mobility;Reduced laryngeal elevation Pharyngeal -- Pharyngeal- Thin Straw Pharyngeal residue - valleculae;Pharyngeal residue - pyriform;Reduced anterior laryngeal mobility;Reduced laryngeal elevation Pharyngeal -- Pharyngeal- Puree Pharyngeal residue - valleculae;Pharyngeal residue - pyriform;Reduced anterior laryngeal mobility;Reduced laryngeal elevation Pharyngeal -- Pharyngeal- Mechanical Soft -- Pharyngeal -- Pharyngeal- Regular -- Pharyngeal -- Pharyngeal- Multi-consistency -- Pharyngeal -- Pharyngeal- Pill -- Pharyngeal -- Pharyngeal Comment --  Leah McCoy MA, CCC-SLP McCoy Leah Meryl 02/21/2019, 3:56 PM              IR PICC PLACEMENT RIGHT >  [place] GUIDE  Result Date: 02/21/2019 INDICATION: Laryngeal cancer, access for initiation of chemotherapy EXAM: ULTRASOUND AND FLUOROSCOPIC GUIDED PICC LINE INSERTION MEDICATIONS: 1% lidocaine local CONTRAST:  None FLUOROSCOPY TIME:  Twelve seconds (1 mGy) COMPLICATIONS: None immediate. TECHNIQUE: The procedure,  risks, benefits, and alternatives were explained to the patient and informed written consent was obtained. A timeout was performed prior to the initiation of the procedure. The right upper extremity was prepped with chlorhexidine in a sterile fashion, and a sterile drape was applied covering the operative field. Maximum barrier sterile technique with sterile gowns and gloves were used for the procedure. A timeout was performed prior to the initiation of the procedure. Local anesthesia was provided with 1% lidocaine. Under direct ultrasound guidance, the right basilic vein was accessed with a micropuncture kit after the overlying soft tissues were anesthetized with 1% lidocaine. An ultrasound image was saved for documentation purposes. A guidewire was advanced to the level of the superior caval-atrial junction for measurement purposes and the PICC line was cut to length. A peel-away sheath was placed and a 37 cm, 5 [place], dual lumen was inserted to level of the superior caval-atrial junction. A post procedure spot fluoroscopic was obtained. The catheter easily aspirated and flushed and was sutured in place. A dressing was placed. The patient tolerated the procedure well without immediate post procedural complication. FINDINGS: After catheter placement, the tip lies within the superior cavoatrial junction. The catheter aspirates and flushes normally and is ready for immediate use. IMPRESSION: Successful ultrasound and fluoroscopic guided placement of a right basilic vein approach, 37 cm, 5 French, dual lumen PICC with tip at the superior caval-atrial junction. The PICC line is ready for immediate use. Electronically Signed   By: Jerilynn Mages.  Shick M.D.   On: 02/21/2019 11:25        Scheduled Meds:  amLODipine  5 mg Oral Daily   atorvastatin  80 mg Oral QPM   chlorhexidine  15 mL Mouth Rinse BID   Chlorhexidine Gluconate Cloth  6 each Topical Q0600    HYDROmorphone (DILAUDID) injection  0.5 mg Intravenous Once    lisinopril  20 mg Oral Daily   pantoprazole (PROTONIX) IV  40 mg Intravenous QHS   Continuous Infusions:  lactated ringers     lactated ringers 75 mL/hr at 02/19/19 1400   sodium chloride irrigation       Time spent: 20 minutes with over 50% of the time coordinating the patient's care    Harold Hedge, DO [facility] Pager [phone]  Call night coverage person covering after 7pm

## 2019-02-22 NOTE — Progress Notes (Signed)
**Note Jorge-Identified via Obfuscation** Responded to Spiritual Consult initiated by his wife who is afraid her husband is giving up.  [name] alert watching tv.  Invited me in with a wave of his hand and turned off tv volume.  When I asked if he could talk he put a cap on his trach airway and said he could speak some but not for long.  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  After telling me he was baptized at 15 he removed the cap.  We "talked" about him accepting Christ as his Savior at 5 and how that commitment has carried him through his life.  And how he is saved by his faith.  When I asked if he would like me to read some scripture he nodded yes.  I read Rodman Key 8:14-17 and Psalm 21.  Then we prayed the Lord's Prayer together.  [name] shed a tear and waved goodbye, thanking me for coming with a thumb's up.  Chaplain is available for follow-up visit at any time.  [name] Chaplain Resident

## 2019-02-22 NOTE — Progress Notes (Signed)
V tach noted on telemetry strip 02/21/2019 at 2114. MD notified. Pt resting comfortably denies discomfort. Will continue to monitor.

## 2019-02-23 ENCOUNTER — Inpatient Hospital Stay (HOSPITAL_COMMUNITY): Payer: 59

## 2019-02-23 DIAGNOSIS — M79675 Pain in left toe(s): Secondary | ICD-10-CM

## 2019-02-23 LAB — C-REACTIVE PROTEIN: CRP: 8.4 mg/dL — ABNORMAL HIGH (ref <1.0)

## 2019-02-23 LAB — URIC ACID: Uric Acid, Serum: 3.1 mg/dL — ABNORMAL LOW (ref 3.7–8.6)

## 2019-02-23 LAB — SEDIMENTATION RATE: Sed Rate: 25 mm/hr — ABNORMAL HIGH (ref 0–16)

## 2019-02-23 MED ORDER — PANTOPRAZOLE SODIUM 40 MG PO TBEC
40.0000 mg | DELAYED_RELEASE_TABLET | Freq: Two times a day (BID) | ORAL | Status: DC
  Administered 2019-02-23 – 2019-02-26 (×5): 40 mg via ORAL
  Filled 2019-02-23 (×6): qty 1

## 2019-02-23 MED ORDER — CEFAZOLIN SODIUM-DEXTROSE 2-4 GM/100ML-% IV SOLN
2.0000 g | Freq: Once | INTRAVENOUS | Status: AC
  Administered 2019-02-24: 2 g via INTRAVENOUS
  Filled 2019-02-23: qty 100

## 2019-02-23 MED ORDER — NAPROXEN 250 MG PO TABS
500.0000 mg | ORAL_TABLET | Freq: Two times a day (BID) | ORAL | Status: DC
  Administered 2019-02-23 – 2019-02-24 (×2): 500 mg via ORAL
  Filled 2019-02-23 (×3): qty 2

## 2019-02-23 NOTE — Progress Notes (Signed)
Subjective: No issues overnight. Resting comfortably in bed.  Objective: Vital signs in last 24 hours: Temp:  [98.3 F (36.8 C)] 98.3 F (36.8 C) (02/20 2230) Pulse Rate:  [63-105] 63 (02/21 0814) Resp:  [15-20] 18 (02/21 0814) BP: (136-152)/(67-112) 136/112 (02/21 0824) SpO2:  [94 %-100 %] 100 % (02/21 0814) FiO2 (%):  [28 %] 28 % (02/21 0814)  Physical Exam: General:Alert and responsive. NAD. Head: Normocephalic, no evidence injury, no tenderness, facial buttresses intact without stepoff.  Eyes: PERRL, EOMI. No scleral icterus, conjunctivae clear.  Nose: Normal skin and external support. Anterior rhinoscopy reveals healthy pink mucosa over the septum and turbinates.  Oral cavity:s/p dental extractions. Facial edema resolved. Neck: Trach in place. Breathing well via trach. No bleeding.   Recent Labs    02/21/19 0447 02/22/19 0226  WBC 8.7 9.4  HGB 10.6* 11.1*  HCT 31.7* 32.4*  PLT 249 280   Recent Labs    02/21/19 0447 02/22/19 0226  NA 132* 134*  K 3.4* 3.6  CL 96* 95*  CO2 25 26  GLUCOSE 108* 109*  BUN 13 12  CREATININE 0.91 0.89  CALCIUM 8.8* 8.7*    Medications:  I have reviewed the patient's current medications. Scheduled: . amLODipine  5 mg Oral Daily  . atorvastatin  80 mg Oral QPM  . chlorhexidine  15 mL Mouth Rinse BID  . Chlorhexidine Gluconate Cloth  6 each Topical Q0600  . feeding supplement (ENSURE ENLIVE)  237 mL Oral TID BM  .  HYDROmorphone (DILAUDID) injection  0.5 mg Intravenous Once  . lisinopril  20 mg Oral Daily  . pantoprazole (PROTONIX) IV  40 mg Intravenous QHS   Continuous: . [START ON 02/24/2019]  ceFAZolin (ANCEF) IV    . lactated ringers    . lactated ringers 75 mL/hr at 02/23/19 0400  . sodium chloride irrigation     SFK:[id number] **OR** acetaminophen, heparin lock flush, hydrALAZINE, lidocaine, morphine injection  Assessment/Plan: POD #5s/p trach and dental extractions. - On trach collar. Lurline Idol care  teaching. Will change trach tomorrow to an uncuffed #6 Shiley. - ChemoXRT per medical and radiation oncology. - Plan G-tube placement next week.   LOS: 5 days   [name] 02/23/2019, 9:29 AM

## 2019-02-23 NOTE — Progress Notes (Addendum)
PROGRESS NOTE    [name]    Code Status: Full Code  NGE:[id number] DOB: Nov 15, 1960 DOA: 02/18/2019 LOS: 5 days  PCP: [name], MD CC: No chief complaint on file.      Hospital Summary  LEELAN RAJEWSKI is a 59 y.o. male with a history of recently diagnosed laryngeal squamous cell carcinoma, anxiety, bradycardia, carotid artery stenosis, chronic low back pain, GERD, stroke in 2020 with residual left-sided weakness who presented to [facility] on 2/16 for elective dental extractions prior to initiating chemotherapy and radiation for his newly diagnosed cancer however during anesthesia induction he was noted to have very difficult airway requiring multiple attempts before an ETT was placed.  Due to significant bleeding and trauma there was concern for airway protection and ENT was consulted who decided to proceed with cuffed tracheostomy after his dental procedure and in ICU for observation.  He was transferred out of the ICU in [facility] assumed care on 2/18.  He was since evaluated by SLP.  IR commended general surgery consult for G tube insertion due to abnormal gastric anatomy and IR to place Port-A-Cath in anticipation of starting chemotherapy.  Due to leukocytosis he was started on Ancef per dental and ENT team which improved and This was discontinued 2/19.   2/19 underwent IR guided RUE dual-lumen power PICC instead of Port-A-Cath for chemotherapy.  General surgery consulted for G-tube placement and recommended procedure on Monday.  A & P   Active Problems:   Malignant neoplasm of supraglottis (HCC)   Hx of tracheostomy   Head and neck cancer ([place])   1. Newly diagnosed supraglottic posterior laryngeal squamous cell carcinoma complicated by traumatic intubation during dental procedure, s/p tracheostomy placement, POD 5 a. 2/19 PICC placed by IR for anticipation of chemotherapy b. General surgery consulted: G-tube placement on Monday c. Completed 4 days Ancef for concern of leukocytosis and  possible infection during procedure.  Discontinued 2/19 d. Copious trach secretions with report of blood around trach by RRT 2/19, unlikely infectious, more likely due to chronic issue. 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 Also tobacco use from age 72, quit 11 years go.  Needs oncology follow-up.  e. hold off on further antibiotics and continue to monitor for infection f. Morphine as needed g. Modified Barium Swallow study: no aspiration.  h. SLP: dys 1 diet i. NPO after midnight j. Continue p.o. meds k. Follow-up with oncology outpatient 2. Left Great Toe Pain, concern for gout vs. Pseudogout a. Xray unremarkable b. ESR, CRP, Uric Acid ordered  c. Discussed with Ortho, difficult to obtain joint aspirate from left great toe. Recommended to treat empirically for gout d. Will give NSAID and PPI, prednisone listed on allergy list 3. Hypertension a. Continue lisinopril and amlodipine  4. Low back pain a. Heating pad 5. Depression a. Does not wish to start SSRI 6. Compensated HFpEF 7. CAD a. statin  b. hold aspirin and Plavix due to upcoming G-tube    DVT prophylaxis: SCDs Family Communication: Wife updated yesterday Disposition Plan:   Patient came from:  home  Anticipated d/c place: home  Barriers to d/c: G tube placement Monday  Pressure injury documentation    None  Consultants  IR Dentistry ENT PCCM   Procedures  2/16 multiple dental extractions 2/16 tracheostomy placement  Antibiotics   Anti-infectives (From admission, onward)   Start     Dose/Rate Route Frequency Ordered Stop   02/24/19 0600  ceFAZolin (ANCEF) IVPB 2g/100 mL premix     2 g 200 mL/hr over 30 Minutes Intravenous  Once 02/23/19 0653     02/19/19 1200  ceFAZolin (ANCEF) IVPB 2g/100 mL premix  Status:  Discontinued     2 g 200  mL/hr over 30 Minutes Intravenous Every 8 hours 02/19/19 1045 02/21/19 1638   02/19/19 1045  ceFAZolin (ANCEF) IVPB 2g/100 mL premix  Status:  Discontinued     2 g 200 mL/hr over 30 Minutes Intravenous Every 8 hours 02/19/19 1032 02/19/19 1045   02/18/19 0615  ceFAZolin (ANCEF) IVPB 2g/100 mL premix     2 g 200 mL/hr over 30 Minutes Intravenous  Once 02/18/19 0610 02/18/19 0753   02/18/19 0613  ceFAZolin (ANCEF) 2-4 GM/100ML-% IVPB    Note to Pharmacy: Maryjean Ka   : cabinet override      02/18/19 9381 02/18/19 0801        Subjective   Reports no depressive symptoms currently. States that he has left great toe pain/redness/swelling since Wednesday. Does not know if he had any trauma. Feels like he broke it. Also states he has not had a bowel movement but does not wish to adjust his bowel regimen  No other complaints.  Objective   Vitals:   02/23/19 0814 02/23/19 0824 02/23/19 1107 02/23/19 1526  BP:  (!) 136/112    Pulse: 63  80 97  Resp: '18  18 15  '$ Temp:      TempSrc:      SpO2: 100%  100% 97%  Weight:      Height:        Intake/Output Summary (Last 24 hours) at 02/23/2019 1529 Last data filed at 02/23/2019 0603 Gross per 24 hour  Intake --  Output 900 ml  Net -900 ml   Filed Weights   02/19/19 0500 02/20/19 0500 02/21/19 0500  Weight: 81 kg 82.1 kg 83 kg    Examination:  Physical Exam Vitals and nursing note reviewed.  Constitutional:      Appearance: Normal appearance.  HENT:     Head: Normocephalic and atraumatic.  Eyes:     Conjunctiva/sclera: Conjunctivae normal.  Cardiovascular:     Rate and Rhythm: Normal rate and regular rhythm.  Pulmonary:     Effort: Pulmonary effort is normal.     Breath sounds: Normal breath sounds.  Abdominal:     General: Abdomen is flat.     Palpations: Abdomen is soft.  Musculoskeletal:     Comments: Left great toe erythema, warmth, tenderness to palpation over joint  Skin:    Coloration: Skin is not jaundiced or  pale.  Neurological:     Mental Status: He is alert. Mental status is at baseline.  Psychiatric:        Mood and Affect: Mood normal.        Behavior: Behavior normal.     Data Reviewed: I have personally reviewed following labs and imaging studies  CBC: Recent Labs  Lab 02/17/19 1419 02/19/19 0419 02/20/19 0809 02/21/19 0447 02/22/19 0226  WBC 7.5 12.1* 10.3 8.7 9.4  NEUTROABS  --   --   --  6.7  --   HGB 11.9* 11.5* 11.2* 10.6* 11.1*  HCT 35.2* 33.3* 34.0* 31.7* 32.4*  MCV 94.6 94.1 97.7 95.5 94.5  PLT 269 262 258 249 579   Basic Metabolic Panel: Recent Labs  Lab 02/17/19 1419 02/19/19 0419 02/20/19 0809 02/21/19 0447 02/22/19 0226  NA 132* 134* 135 132* 134*  K 4.5 3.6 3.6 3.4* 3.6  CL 98 97* 98 96* 95*  CO2 '23 24 26 25 26  '$ GLUCOSE 101* 110* 104* 108* 109*  BUN '11 9 12 13 12  '$ CREATININE 0.99 0.97 1.03 0.91 0.89  CALCIUM 9.7 9.3 9.0 8.8* 8.7*   GFR: Estimated Creatinine Clearance: 96.1 mL/min (by C-G formula based on SCr of 0.89 mg/dL). Liver Function Tests: Recent Labs  Lab 02/17/19 1419  AST 20  ALT 15  ALKPHOS 61  BILITOT 0.9  PROT 6.9  ALBUMIN 3.9   No results for input(s): LIPASE, AMYLASE in the last 168 hours. No results for input(s): AMMONIA in the last 168 hours. Coagulation Profile: Recent Labs  Lab 02/21/19 0447  INR 1.0   Cardiac Enzymes: No results for input(s): CKTOTAL, CKMB, CKMBINDEX, TROPONINI in the last 168 hours. BNP (last 3 results) No results for input(s): PROBNP in the last 8760 hours. HbA1C: No results for input(s): HGBA1C in the last 72 hours. CBG: No results for input(s): GLUCAP in the last 168 hours. Lipid Profile: No results for input(s): CHOL, HDL, LDLCALC, TRIG, CHOLHDL, LDLDIRECT in the last 72 hours. Thyroid Function Tests: No results for input(s): TSH, T4TOTAL, FREET4, T3FREE, THYROIDAB in the last 72 hours. Anemia Panel: No results for input(s): VITAMINB12, FOLATE, FERRITIN, TIBC, IRON, RETICCTPCT in the  last 72 hours. Sepsis Labs: No results for input(s): PROCALCITON, LATICACIDVEN in the last 168 hours.  Recent Results (from the past 240 hour(s))  SARS CORONAVIRUS 2 (TAT 6-24 HRS) Nasopharyngeal Nasopharyngeal Swab     Status: None   Collection Time: 02/17/19  2:48 PM   Specimen: Nasopharyngeal Swab  Result Value Ref Range Status   SARS Coronavirus 2 NEGATIVE NEGATIVE Final    Comment: (NOTE) SARS-CoV-2 target nucleic acids are NOT DETECTED. The SARS-CoV-2 RNA is generally detectable in upper and lower respiratory specimens during the acute phase of infection. Negative results do not preclude SARS-CoV-2 infection, do not rule out co-infections with other pathogens, and should not be used as the sole basis for treatment or other patient management decisions. Negative results must be combined with clinical observations, patient history, and epidemiological information. The expected result is Negative. Fact Sheet for Patients: SugarRoll.be Fact Sheet for Healthcare Providers: https://www.woods-mathews.com/ This test is not yet approved or cleared by the [place] FDA and  has been authorized for detection and/or diagnosis of SARS-CoV-2 by FDA under an Emergency Use Authorization (EUA). This EUA will remain  in effect (meaning this test can be used) for the duration of the COVID-19 declaration under Section 56 4(b)(1) of the Act, 21 U.S.C. section 360bbb-3(b)(1), unless the authorization is terminated or revoked sooner. Performed at [facility], [place] [address], [place], [place] [address]   MRSA PCR Screening     Status: None   Collection Time: 02/18/19  5:00 PM  Result Value Ref Range Status   MRSA by PCR NEGATIVE NEGATIVE Final    Comment:        The GeneXpert MRSA Assay (FDA approved for NASAL specimens only), is one component of a comprehensive MRSA colonization surveillance program. It is not intended to diagnose  MRSA  infection nor to guide or monitor treatment for MRSA infections. Performed at [facility], [place] [address], [place], [place] [address]   Culture, blood (Routine X 2) w Reflex to ID Panel     Status: None (Preliminary result)   Collection Time: 02/19/19 11:18 AM   Specimen: BLOOD  Result Value Ref Range Status   Specimen Description BLOOD RIGHT ANTECUBITAL  Final   Special Requests   Final    BOTTLES DRAWN AEROBIC AND ANAEROBIC Blood Culture results may not be optimal due to an excessive volume of blood received in culture bottles   Culture   Final    NO GROWTH 4 DAYS Performed at [facility], [place] [address] [place], [place] [address]    Report Status PENDING  Incomplete  Culture, blood (Routine X 2) w Reflex to ID Panel     Status: None (Preliminary result)   Collection Time: 02/19/19 11:18 AM   Specimen: BLOOD RIGHT ARM  Result Value Ref Range Status   Specimen Description BLOOD RIGHT ARM  Final   Special Requests   Final    BOTTLES DRAWN AEROBIC AND ANAEROBIC Blood Culture results may not be optimal due to an excessive volume of blood received in culture bottles   Culture   Final    NO GROWTH 4 DAYS Performed at [facility], [place] [address] [place], [place] [address]    Report Status PENDING  Incomplete         Radiology Studies: DG Foot 2 Views Left  Result Date: 02/23/2019 CLINICAL DATA:  Right first toe pain one week. EXAM: LEFT FOOT - 2 VIEW COMPARISON:  None. FINDINGS: No evidence of acute fracture or dislocation. No significant degenerative changes or erosive change. Small inferior calcaneal spur is present. No focal soft tissue abnormality. IMPRESSION: No acute findings. Electronically Signed   By: Marin Olp M.D.   On: 02/23/2019 11:50        Scheduled Meds: . amLODipine  5 mg Oral Daily  . atorvastatin  80 mg Oral QPM  . chlorhexidine  15 mL Mouth Rinse BID  . Chlorhexidine Gluconate Cloth  6 each Topical Q0600  . feeding  supplement (ENSURE ENLIVE)  237 mL Oral TID BM  .  HYDROmorphone (DILAUDID) injection  0.5 mg Intravenous Once  . lisinopril  20 mg Oral Daily  . pantoprazole (PROTONIX) IV  40 mg Intravenous QHS   Continuous Infusions: . [START ON 02/24/2019]  ceFAZolin (ANCEF) IV    . lactated ringers    . lactated ringers 75 mL/hr at 02/23/19 0400  . sodium chloride irrigation       Time spent: 25 minutes with over 50% of the time coordinating the patient's care    Harold Hedge, DO [facility] Pager [phone]  Call night coverage person covering after 7pm

## 2019-02-24 ENCOUNTER — Ambulatory Visit
Admission: RE | Admit: 2019-02-24 | Discharge: 2019-02-24 | Disposition: A | Discharge status: Home / Self Care | Payer: 59 | Source: Ambulatory Visit | Attending: Radiation Oncology | Admitting: Radiation Oncology

## 2019-02-24 ENCOUNTER — Encounter (HOSPITAL_COMMUNITY)
Admission: AD | Disposition: A | Discharge status: Home / Self Care | Payer: Self-pay | Source: Home / Self Care | Attending: Internal Medicine

## 2019-02-24 ENCOUNTER — Ambulatory Visit: Payer: 59 | Admitting: Radiation Oncology

## 2019-02-24 ENCOUNTER — Encounter (HOSPITAL_COMMUNITY): Payer: Self-pay | Admitting: Pulmonary Disease

## 2019-02-24 ENCOUNTER — Inpatient Hospital Stay (HOSPITAL_COMMUNITY): Payer: 59 | Admitting: Anesthesiology

## 2019-02-24 ENCOUNTER — Other Ambulatory Visit (HOSPITAL_COMMUNITY): Payer: Self-pay | Admitting: Hematology

## 2019-02-24 HISTORY — PX: LAPAROSCOPIC INSERTION GASTROSTOMY TUBE: SHX6817

## 2019-02-24 LAB — CBC
HCT: 27.2 % — ABNORMAL LOW (ref 39.0–52.0)
Hemoglobin: 9.3 g/dL — ABNORMAL LOW (ref 13.0–17.0)
MCH: 32.3 pg (ref 26.0–34.0)
MCHC: 34.2 g/dL (ref 30.0–36.0)
MCV: 94.4 fL (ref 80.0–100.0)
Platelets: 252 10*3/uL (ref 150–400)
RBC: 2.88 MIL/uL — ABNORMAL LOW (ref 4.22–5.81)
RDW: 12.5 % (ref 11.5–15.5)
WBC: 7.7 10*3/uL (ref 4.0–10.5)
nRBC: 0 % (ref 0.0–0.2)

## 2019-02-24 LAB — CULTURE, BLOOD (ROUTINE X 2)
Culture: NO GROWTH
Culture: NO GROWTH

## 2019-02-24 LAB — BASIC METABOLIC PANEL
Anion gap: 9 (ref 5–15)
BUN: 11 mg/dL (ref 6–20)
CO2: 27 mmol/L (ref 22–32)
Calcium: 8.2 mg/dL — ABNORMAL LOW (ref 8.9–10.3)
Chloride: 98 mmol/L (ref 98–111)
Creatinine, Ser: 0.83 mg/dL (ref 0.61–1.24)
GFR calc Af Amer: >60 mL/min (ref >60)
GFR calc non Af Amer: >60 mL/min (ref >60)
Glucose, Bld: 110 mg/dL — ABNORMAL HIGH (ref 70–99)
Potassium: 3.7 mmol/L (ref 3.5–5.1)
Sodium: 134 mmol/L — ABNORMAL LOW (ref 135–145)

## 2019-02-24 SURGERY — INSERTION, GASTROSTOMY TUBE, PERCUTANEOUS
Anesthesia: General | Site: Abdomen

## 2019-02-24 MED ORDER — ONDANSETRON HCL 4 MG/2ML IJ SOLN
INTRAMUSCULAR | Status: DC | PRN
  Administered 2019-02-24: 4 mg via INTRAVENOUS

## 2019-02-24 MED ORDER — SUGAMMADEX SODIUM 200 MG/2ML IV SOLN
INTRAVENOUS | Status: DC | PRN
  Administered 2019-02-24: 200 mg via INTRAVENOUS

## 2019-02-24 MED ORDER — DEXAMETHASONE SODIUM PHOSPHATE 10 MG/ML IJ SOLN
INTRAMUSCULAR | Status: DC | PRN
  Administered 2019-02-24: 5 mg via INTRAVENOUS

## 2019-02-24 MED ORDER — FENTANYL CITRATE (PF) 100 MCG/2ML IJ SOLN
INTRAMUSCULAR | Status: AC
  Filled 2019-02-24: qty 2

## 2019-02-24 MED ORDER — PROPOFOL 10 MG/ML IV BOLUS
INTRAVENOUS | Status: DC | PRN
  Administered 2019-02-24 (×2): 30 mg via INTRAVENOUS
  Administered 2019-02-24: 50 mg via INTRAVENOUS

## 2019-02-24 MED ORDER — FENTANYL CITRATE (PF) 100 MCG/2ML IJ SOLN
INTRAMUSCULAR | Status: DC | PRN
  Administered 2019-02-24 (×2): 50 ug via INTRAVENOUS

## 2019-02-24 MED ORDER — OXYCODONE HCL 5 MG PO TABS: 5.0000 mg | ORAL_TABLET | Freq: Once | ORAL | Status: DC | PRN

## 2019-02-24 MED ORDER — SODIUM BICARBONATE 650 MG PO TABS
650.0000 mg | ORAL_TABLET | Freq: Once | ORAL | Status: DC
  Filled 2019-02-24: qty 1

## 2019-02-24 MED ORDER — KETOROLAC TROMETHAMINE 30 MG/ML IJ SOLN
INTRAMUSCULAR | Status: AC
  Filled 2019-02-24: qty 1

## 2019-02-24 MED ORDER — SODIUM CHLORIDE 0.9 % IR SOLN
Status: DC | PRN
  Administered 2019-02-24: 1000 mL

## 2019-02-24 MED ORDER — ROCURONIUM BROMIDE 10 MG/ML (PF) SYRINGE
PREFILLED_SYRINGE | INTRAVENOUS | Status: AC
  Filled 2019-02-24: qty 10

## 2019-02-24 MED ORDER — HEPARIN SODIUM (PORCINE) 5000 UNIT/ML IJ SOLN
5000.0000 [IU] | Freq: Once | INTRAMUSCULAR | Status: AC
  Filled 2019-02-24: qty 1

## 2019-02-24 MED ORDER — PHENYLEPHRINE HCL-NACL 10-0.9 MG/250ML-% IV SOLN
INTRAVENOUS | Status: DC | PRN
  Administered 2019-02-24: 25 ug/min via INTRAVENOUS

## 2019-02-24 MED ORDER — 0.9 % SODIUM CHLORIDE (POUR BTL) OPTIME
TOPICAL | Status: DC | PRN
  Administered 2019-02-24: 1000 mL

## 2019-02-24 MED ORDER — OXYCODONE HCL 5 MG/5ML PO SOLN: 5.0000 mg | Freq: Once | ORAL | Status: DC | PRN

## 2019-02-24 MED ORDER — MIDAZOLAM HCL 2 MG/2ML IJ SOLN
INTRAMUSCULAR | Status: AC
  Filled 2019-02-24: qty 2

## 2019-02-24 MED ORDER — ROCURONIUM BROMIDE 10 MG/ML (PF) SYRINGE
PREFILLED_SYRINGE | INTRAVENOUS | Status: DC | PRN
  Administered 2019-02-24: 60 mg via INTRAVENOUS

## 2019-02-24 MED ORDER — PANCRELIPASE (LIP-PROT-AMYL) 10440-39150 UNITS PO TABS
20880.0000 [IU] | ORAL_TABLET | Freq: Once | ORAL | Status: DC
  Filled 2019-02-24: qty 2

## 2019-02-24 MED ORDER — SUCCINYLCHOLINE CHLORIDE 200 MG/10ML IV SOSY
PREFILLED_SYRINGE | INTRAVENOUS | Status: AC
  Filled 2019-02-24: qty 10

## 2019-02-24 MED ORDER — ONDANSETRON HCL 4 MG/2ML IJ SOLN
INTRAMUSCULAR | Status: AC
  Filled 2019-02-24: qty 2

## 2019-02-24 MED ORDER — ONDANSETRON HCL 4 MG/2ML IJ SOLN: 4.0000 mg | Freq: Once | INTRAMUSCULAR | Status: DC | PRN

## 2019-02-24 MED ORDER — MIDAZOLAM HCL 5 MG/5ML IJ SOLN
INTRAMUSCULAR | Status: DC | PRN
  Administered 2019-02-24: 2 mg via INTRAVENOUS

## 2019-02-24 MED ORDER — LIDOCAINE 2% (20 MG/ML) 5 ML SYRINGE
INTRAMUSCULAR | Status: AC
  Filled 2019-02-24: qty 5

## 2019-02-24 MED ORDER — BUPIVACAINE HCL (PF) 0.25 % IJ SOLN
INTRAMUSCULAR | Status: AC
  Filled 2019-02-24: qty 30

## 2019-02-24 MED ORDER — FENTANYL CITRATE (PF) 100 MCG/2ML IJ SOLN
25.0000 ug | INTRAMUSCULAR | Status: DC | PRN
  Administered 2019-02-24: 50 ug via INTRAVENOUS

## 2019-02-24 MED ORDER — FENTANYL CITRATE (PF) 250 MCG/5ML IJ SOLN
INTRAMUSCULAR | Status: AC
  Filled 2019-02-24: qty 5

## 2019-02-24 MED ORDER — BUPIVACAINE HCL (PF) 0.25 % IJ SOLN
INTRAMUSCULAR | Status: DC | PRN
  Administered 2019-02-24: 26 mL

## 2019-02-24 MED ORDER — HEPARIN SODIUM (PORCINE) 5000 UNIT/ML IJ SOLN
INTRAMUSCULAR | Status: AC
Start: 2019-02-24 — End: 2019-02-24
  Administered 2019-02-24: 5000 [IU] via SUBCUTANEOUS
  Filled 2019-02-24: qty 1

## 2019-02-24 SURGICAL SUPPLY — 43 items
APL SKNCLS STERI-STRIP NONHPOA (GAUZE/BANDAGES/DRESSINGS)
BENZOIN TINCTURE PRP APPL 2/3 (GAUZE/BANDAGES/DRESSINGS) ×1 IMPLANT
BLADE CLIPPER SURG (BLADE) ×1 IMPLANT
CANISTER SUCT 3000ML PPV (MISCELLANEOUS) ×2 IMPLANT
CHLORAPREP W/TINT 26 (MISCELLANEOUS) ×2 IMPLANT
COVER SURGICAL LIGHT HANDLE (MISCELLANEOUS) ×2 IMPLANT
COVER WAND RF STERILE (DRAPES) ×1 IMPLANT
DECANTER SPIKE VIAL GLASS SM (MISCELLANEOUS) ×2 IMPLANT
DERMABOND ADVANCED (GAUZE/BANDAGES/DRESSINGS) ×1
DERMABOND ADVANCED .7 DNX12 (GAUZE/BANDAGES/DRESSINGS) IMPLANT
DRAPE LAPAROSCOPIC ABDOMINAL (DRAPES) ×1 IMPLANT
ELECT REM PT RETURN 9FT ADLT (ELECTROSURGICAL) ×2
ELECTRODE REM PT RTRN 9FT ADLT (ELECTROSURGICAL) ×1 IMPLANT
GLOVE BIOGEL M STRL SZ7.5 (GLOVE) ×2 IMPLANT
GLOVE INDICATOR 8.0 STRL GRN (GLOVE) ×4 IMPLANT
GOWN STRL REUS W/ TWL LRG LVL3 (GOWN DISPOSABLE) ×3 IMPLANT
GOWN STRL REUS W/TWL 2XL LVL3 (GOWN DISPOSABLE) ×2 IMPLANT
GOWN STRL REUS W/TWL LRG LVL3 (GOWN DISPOSABLE) ×3
KIT BASIN OR (CUSTOM PROCEDURE TRAY) ×2 IMPLANT
KIT TURNOVER KIT B (KITS) ×2 IMPLANT
NS IRRIG 1000ML POUR BTL (IV SOLUTION) ×2 IMPLANT
PAD ARMBOARD 7.5X6 YLW CONV (MISCELLANEOUS) ×4 IMPLANT
PLUG CATH AND CAP STER (CATHETERS) IMPLANT
SCISSORS LAP 5X35 DISP (ENDOMECHANICALS) ×1 IMPLANT
SET IRRIG TUBING LAPAROSCOPIC (IRRIGATION / IRRIGATOR) ×1 IMPLANT
SET TUBE SMOKE EVAC HIGH FLOW (TUBING) ×2 IMPLANT
SLEEVE ENDOPATH XCEL 5M (ENDOMECHANICALS) ×2 IMPLANT
SPONGE DRAIN TRACH 4X4 STRL 2S (GAUZE/BANDAGES/DRESSINGS) ×2 IMPLANT
SUT ETHILON 2 0 FS 18 (SUTURE) ×2 IMPLANT
SUT MNCRL AB 4-0 PS2 18 (SUTURE) ×1 IMPLANT
SUT SILK 0 SH 30 (SUTURE) ×1 IMPLANT
SUT SILK 2 0 SH (SUTURE) ×9 IMPLANT
SUT VIC AB 4-0 PS2 27 (SUTURE) ×2 IMPLANT
SYR 10ML LL (SYRINGE) ×1 IMPLANT
TOWEL GREEN STERILE (TOWEL DISPOSABLE) ×2 IMPLANT
TOWEL GREEN STERILE FF (TOWEL DISPOSABLE) ×2 IMPLANT
TRAY LAPAROSCOPIC MC (CUSTOM PROCEDURE TRAY) ×2 IMPLANT
TROCAR XCEL BLUNT TIP 100MML (ENDOMECHANICALS) IMPLANT
TROCAR XCEL NON BLADE 8MM B8LT (ENDOMECHANICALS) ×1 IMPLANT
TROCAR XCEL NON-BLD 11X100MML (ENDOMECHANICALS) IMPLANT
TROCAR XCEL NON-BLD 5MMX100MML (ENDOMECHANICALS) ×4 IMPLANT
TUBE GASTROSTOMY 18F (CATHETERS) ×1 IMPLANT
WATER STERILE IRR 1000ML POUR (IV SOLUTION) ×2 IMPLANT

## 2019-02-24 NOTE — Progress Notes (Addendum)
Called and spoke with  Mr. Gotay wife to set up time for her and her daughter to come in for trach education and trach cleaning and management.  She will be calling me back today to confirm times once she speak with her daughter.Mr. Interrante returned the call at 48.  She and her daughter will be here at the hospital at 11:00 am tomorrow for education and training.  I called and spoke to Regino Schultze his nurse today on 2W so she would be aware of the time.  She will have them to call once they get to the hospital entrance desk to get permission for both family members to be allowed to come to floor for education and training.

## 2019-02-24 NOTE — Anesthesia Postprocedure Evaluation (Signed)
Anesthesia Post Note  Patient: [name]  Procedure(s) Performed: LAPAROSCOPIC INSERTION GASTROSTOMY TUBE (N/A Abdomen)     Patient location during evaluation: PACU Anesthesia Type: General Level of consciousness: awake and alert Pain management: pain level controlled Vital Signs Assessment: post-procedure vital signs reviewed and stable Respiratory status: spontaneous breathing, nonlabored ventilation, respiratory function stable and patient connected to tracheostomy mask oxygen Cardiovascular status: blood pressure returned to baseline and stable Postop Assessment: no apparent nausea or vomiting Anesthetic complications: no    Last Vitals:  Vitals:   02/24/19 1414 02/24/19 1553  BP: (!) 169/86 (!) 162/85  Pulse: 82 97  Resp:  19  Temp: 36.5 C 36.9 C  SpO2:  98%    Last Pain:  Vitals:   02/24/19 1553  TempSrc: Oral  PainSc:                  Lidia Collum

## 2019-02-24 NOTE — Progress Notes (Signed)
Patient arrives with trach collar 28% with 6 lpm. Patient placed on monitor, NSR and pulse ox 98- 100%. Patient is alert and oriented x 4 and speaks using Passy-Muir speaking valve. Suction and required safety equipment at bedside with patient.

## 2019-02-24 NOTE — Anesthesia Procedure Notes (Signed)
Date/Time: 02/24/2019 10:31 AM Performed by: Kyung Rudd, CRNA Pre-anesthesia Checklist: Patient identified, Emergency Drugs available, Suction available, Patient being monitored and Timeout performed Patient Re-evaluated:Patient Re-evaluated prior to induction Oxygen Delivery Method: Circle system utilized Preoxygenation: Pre-oxygenation with 100% oxygen Induction Type: Combination inhalational/ intravenous induction and Tracheostomy Placement Confirmation: positive ETCO2 and breath sounds checked- equal and bilateral

## 2019-02-24 NOTE — Interval H&P Note (Signed)
History and Physical Interval Note:  02/24/2019 10:05 AM  [name]  has presented today for surgery, with the diagnosis of Laryngeal Cancer.  The various methods of treatment have been discussed with the patient and family. After consideration of risks, benefits and other options for treatment, the patient has consented to  Procedure(s): LAPAROSCOPIC INSERTION GASTROSTOMY TUBE (N/A) as a surgical intervention.  The patient's history has been reviewed, patient examined, no change in status, stable for surgery.  I have reviewed the patient's chart and labs.  Questions were answered to the patient's satisfaction.    I have reviewed the patient's chart as well as admission CT scan.  He has had a prior umbilical hernia with what appears to be some mesh.  Therefore we will gain access to his abdomen using Optiview technique and will attempt to do a laparoscopic gastrostomy tube.  We did discuss the possibility of open but I think that is low likelihood  Also discussed risk and benefits of the procedure including but not limited to bleeding, infection, injury to surrounding structures, blood clot formation, airway and cardiac and pulmonary events, recurrent stroke, wound issues.  We also talked about specific gastrotomy tube complications such as leakage of tube feeds around the G-tube, skin irritation, dislodgment, clogging  All of his questions were asked and answered.  Leighton Ruff. Redmond Pulling, MD, FACS General, Bariatric, & Minimally Invasive Surgery [facility]  [name]

## 2019-02-24 NOTE — Progress Notes (Signed)
PROGRESS NOTE    [name]    Code Status: Full Code  XBJ:[id number] DOB: 1960/04/20 DOA: 02/18/2019 LOS: 6 days  PCP: [name], MD CC: No chief complaint on file.      Hospital Summary  [name] is a 60 y.o. 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  Due to significant bleeding and trauma there was concern for airway protection and ENT was consulted who decided to proceed with cuffed tracheostomy after his dental procedure and in ICU for observation.  He was transferred out of the ICU in [facility] assumed care on 2/18.  He was since evaluated by SLP.  IR commended general surgery consult for G tube insertion due to abnormal gastric anatomy and IR to place Port-A-Cath in anticipation of starting chemotherapy.  Due to leukocytosis he was started on Ancef per dental and ENT team which improved and This was discontinued 2/19.   2/19 underwent IR guided RUE dual-lumen power PICC instead of Port-A-Cath for chemotherapy.  General surgery consulted for G-tube placement and recommended procedure on Monday. 2/22: G tube placement  A & P   Active Problems:   Malignant neoplasm of supraglottis (HCC)   Hx of tracheostomy   Head and neck cancer ([place])   1. Newly diagnosed supraglottic posterior laryngeal squamous cell carcinoma complicated by traumatic intubation during dental procedure, s/p tracheostomy placement, POD 6 a. 2/19 PICC placed by IR for anticipation of chemotherapy b. 2/22 G-tube placement  c. Completed 4 days Ancef for concern of leukocytosis and possible  infection during procedure.  Discontinued 2/19 d. Copious trach secretions with report of blood around trach by RRT 2/19, unlikely infectious, more likely due to chronic issue. 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 Also tobacco use from age 82, quit 11 years go.  Needs oncology follow-up.  e. SLP on board f. ENT to change to uncuffed tube tomorrow g. Follow-up with oncology outpatient 2. Left Great Toe Pain, concern for gout vs. Pseudogout a. Xray unremarkable b. ESR and CRP elevated, uric acid low c. Significant improvement with 1 dose of naproxen d. Discussed with Ortho, difficult to obtain joint aspirate from left great toe. Recommended to treat empirically for gout e. Continue naproxen and PPI 3. Hypertension a. Continue lisinopril and amlodipine  4. Low back pain a. Heating pad 5. Depression a. Does not wish to start SSRI 6. Compensated HFpEF 7. CAD a. statin  b. hold aspirin and Plavix due to upcoming G-tube    DVT prophylaxis: SCDs Family Communication: Wife updated today Disposition Plan:   Patient came from:  home  Anticipated d/c place: home  Barriers to d/c: ENT tube change, PT eval  Pressure injury documentation    None  Consultants  IR Dentistry ENT PCCM   Procedures  2/16 multiple dental extractions 2/16 tracheostomy placement  Antibiotics   Anti-infectives (From admission, onward)   Start     Dose/Rate Route Frequency Ordered Stop   02/24/19 0600  ceFAZolin (ANCEF) IVPB 2g/100 mL premix     2 g 200 mL/hr over 30 Minutes Intravenous  Once 02/23/19 0653 02/24/19 0640   02/19/19 1200  ceFAZolin (ANCEF) IVPB 2g/100 mL premix  Status:  Discontinued     2 g 200 mL/hr over 30 Minutes Intravenous Every 8 hours 02/19/19 1045 02/21/19 1638   02/19/19 1045  ceFAZolin (ANCEF) IVPB 2g/100 mL  premix  Status:  Discontinued     2 g 200 mL/hr over 30 Minutes Intravenous Every 8 hours 02/19/19 1032 02/19/19 1045   02/18/19 0615  ceFAZolin (ANCEF) IVPB 2g/100 mL premix     2 g 200 mL/hr over 30 Minutes Intravenous  Once 02/18/19 0610 02/18/19 0753   02/18/19 0613  ceFAZolin (ANCEF) 2-4 GM/100ML-% IVPB    Note to Pharmacy: Maryjean Ka   : cabinet override      02/18/19 4665 02/18/19 0801        Subjective   Doing well today.  States that his left great toe has significantly improved in swelling and pain with naproxen.  Denies any complaints.  Anticipating upcoming G-tube insertion.  Objective   Vitals:   02/23/19 2022 02/23/19 2349 02/24/19 0300 02/24/19 0822  BP:  (!) 154/86 (!) 146/86   Pulse: 90 68 81 72  Resp: '15 16 18 17  '$ Temp:   98.1 F (36.7 C)   TempSrc:   Axillary   SpO2: 99% 97% 98% 95%  Weight:   82.1 kg   Height:        Intake/Output Summary (Last 24 hours) at 02/24/2019 1039 Last data filed at 02/24/2019 0610 Gross per 24 hour  Intake 1042.01 ml  Output 900 ml  Net 142.01 ml   Filed Weights   02/20/19 0500 02/21/19 0500 02/24/19 0300  Weight: 82.1 kg 83 kg 82.1 kg    Examination:  Physical Exam Vitals and nursing note reviewed.  Constitutional:      Appearance: Normal appearance.  HENT:     Head: Normocephalic and atraumatic.  Eyes:     Conjunctiva/sclera: Conjunctivae normal.  Cardiovascular:     Rate and Rhythm: Normal rate and regular rhythm.  Pulmonary:     Effort: Pulmonary effort is normal.     Comments: Significant secretions from trach Abdominal:     General: Abdomen is flat.     Palpations: Abdomen is soft.  Musculoskeletal:     Comments: Improved erythema, swelling and tenderness of left great toe.  Some mild tenderness to palpation of joint space  Skin:    Coloration: Skin is not jaundiced or pale.  Neurological:     Mental Status: He is alert. Mental status is at baseline.  Psychiatric:        Mood and Affect: Mood  normal.        Behavior: Behavior normal.     Data Reviewed: I have personally reviewed following labs and imaging studies  CBC: Recent Labs  Lab 02/19/19 0419 02/20/19 0809 02/21/19 0447 02/22/19 0226 02/24/19 0111  WBC 12.1* 10.3 8.7 9.4 7.7  NEUTROABS  --   --  6.7  --   --  HGB 11.5* 11.2* 10.6* 11.1* 9.3*  HCT 33.3* 34.0* 31.7* 32.4* 27.2*  MCV 94.1 97.7 95.5 94.5 94.4  PLT 262 258 249 280 431   Basic Metabolic Panel: Recent Labs  Lab 02/19/19 0419 02/20/19 0809 02/21/19 0447 02/22/19 0226 02/24/19 0111  NA 134* 135 132* 134* 134*  K 3.6 3.6 3.4* 3.6 3.7  CL 97* 98 96* 95* 98  CO2 '24 26 25 26 27  '$ GLUCOSE 110* 104* 108* 109* 110*  BUN '9 12 13 12 11  '$ CREATININE 0.97 1.03 0.91 0.89 0.83  CALCIUM 9.3 9.0 8.8* 8.7* 8.2*   GFR: Estimated Creatinine Clearance: 102.6 mL/min (by C-G formula based on SCr of 0.83 mg/dL). Liver Function Tests: Recent Labs  Lab 02/17/19 1419  AST 20  ALT 15  ALKPHOS 61  BILITOT 0.9  PROT 6.9  ALBUMIN 3.9   No results for input(s): LIPASE, AMYLASE in the last 168 hours. No results for input(s): AMMONIA in the last 168 hours. Coagulation Profile: Recent Labs  Lab 02/21/19 0447  INR 1.0   Cardiac Enzymes: No results for input(s): CKTOTAL, CKMB, CKMBINDEX, TROPONINI in the last 168 hours. BNP (last 3 results) No results for input(s): PROBNP in the last 8760 hours. HbA1C: No results for input(s): HGBA1C in the last 72 hours. CBG: No results for input(s): GLUCAP in the last 168 hours. Lipid Profile: No results for input(s): CHOL, HDL, LDLCALC, TRIG, CHOLHDL, LDLDIRECT in the last 72 hours. Thyroid Function Tests: No results for input(s): TSH, T4TOTAL, FREET4, T3FREE, THYROIDAB in the last 72 hours. Anemia Panel: No results for input(s): VITAMINB12, FOLATE, FERRITIN, TIBC, IRON, RETICCTPCT in the last 72 hours. Sepsis Labs: No results for input(s): PROCALCITON, LATICACIDVEN in the last 168 hours.  Recent Results (from  the past 240 hour(s))  SARS CORONAVIRUS 2 (TAT 6-24 HRS) Nasopharyngeal Nasopharyngeal Swab     Status: None   Collection Time: 02/17/19  2:48 PM   Specimen: Nasopharyngeal Swab  Result Value Ref Range Status   SARS Coronavirus 2 NEGATIVE NEGATIVE Final    Comment: (NOTE) SARS-CoV-2 target nucleic acids are NOT DETECTED. The SARS-CoV-2 RNA is generally detectable in upper and lower respiratory specimens during the acute phase of infection. Negative results do not preclude SARS-CoV-2 infection, do not rule out co-infections with other pathogens, and should not be used as the sole basis for treatment or other patient management decisions. Negative results must be combined with clinical observations, patient history, and epidemiological information. The expected result is Negative. Fact Sheet for Patients: SugarRoll.be Fact Sheet for Healthcare Providers: https://www.woods-mathews.com/ This test is not yet approved or cleared by the [place] FDA and  has been authorized for detection and/or diagnosis of SARS-CoV-2 by FDA under an Emergency Use Authorization (EUA). This EUA will remain  in effect (meaning this test can be used) for the duration of the COVID-19 declaration under Section 56 4(b)(1) of the Act, 21 U.S.C. section 360bbb-3(b)(1), unless the authorization is terminated or revoked sooner. Performed at [facility], [place] [address] [place], [place] [address]   MRSA PCR Screening     Status: None   Collection Time: 02/18/19  5:00 PM  Result Value Ref Range Status   MRSA by PCR NEGATIVE NEGATIVE Final    Comment:        The GeneXpert MRSA Assay (FDA approved for NASAL specimens only), is one component of a comprehensive MRSA colonization surveillance program. It is not intended to diagnose MRSA infection nor to guide or monitor  treatment for MRSA infections. Performed at [facility], [place] [address]  [place], [place] [address]   Culture, blood (Routine X 2) w Reflex to ID Panel     Status: None   Collection Time: 02/19/19 11:18 AM   Specimen: BLOOD  Result Value Ref Range Status   Specimen Description BLOOD RIGHT ANTECUBITAL  Final   Special Requests   Final    BOTTLES DRAWN AEROBIC AND ANAEROBIC Blood Culture results may not be optimal due to an excessive volume of blood received in culture bottles   Culture   Final    NO GROWTH 5 DAYS Performed at [facility], [place] [address] [place], [place] [address]    Report Status 02/24/2019 FINAL  Final  Culture, blood (Routine X 2) w Reflex to ID Panel     Status: None   Collection Time: 02/19/19 11:18 AM   Specimen: BLOOD RIGHT ARM  Result Value Ref Range Status   Specimen Description BLOOD RIGHT ARM  Final   Special Requests   Final    BOTTLES DRAWN AEROBIC AND ANAEROBIC Blood Culture results may not be optimal due to an excessive volume of blood received in culture bottles   Culture   Final    NO GROWTH 5 DAYS Performed at [facility], [place] [address] [place], [place] [address]    Report Status 02/24/2019 FINAL  Final         Radiology Studies: DG Foot 2 Views Left  Result Date: 02/23/2019 CLINICAL DATA:  Right first toe pain one week. EXAM: LEFT FOOT - 2 VIEW COMPARISON:  None. FINDINGS: No evidence of acute fracture or dislocation. No significant degenerative changes or erosive change. Small inferior calcaneal spur is present. No focal soft tissue abnormality. IMPRESSION: No acute findings. Electronically Signed   By: Marin Olp M.D.   On: 02/23/2019 11:50        Scheduled Meds: . [MAR Hold] amLODipine  5 mg Oral Daily  . [MAR Hold] atorvastatin  80 mg Oral QPM  . [MAR Hold] chlorhexidine  15 mL Mouth Rinse BID  . [MAR Hold] Chlorhexidine Gluconate Cloth  6 each Topical Q0600  . [MAR Hold] feeding supplement (ENSURE ENLIVE)  237 mL Oral TID BM  . [MAR Hold]  HYDROmorphone (DILAUDID) injection  0.5 mg  Intravenous Once  . [MAR Hold] lisinopril  20 mg Oral Daily  . [MAR Hold] naproxen  500 mg Oral BID WC  . [MAR Hold] pantoprazole  40 mg Oral BID   Continuous Infusions: . lactated ringers    . lactated ringers 75 mL/hr at 02/23/19 2020  . sodium chloride irrigation       Time spent: 26 minutes with over 50% of the time coordinating the patient's care    Harold Hedge, DO [facility] Pager [phone]  Call night coverage person covering after 7pm

## 2019-02-24 NOTE — Progress Notes (Signed)
Subjective: Resting comfortably in bed. Able to talk with the passy muir valve.  Objective: Vital signs in last 24 hours: Temp:  [98.1 F (36.7 C)-98.5 F (36.9 C)] 98.1 F (36.7 C) (02/22 0300) Pulse Rate:  [68-97] 72 (02/22 0822) Resp:  [15-18] 17 (02/22 0822) BP: (141-154)/(86-88) 146/86 (02/22 0300) SpO2:  [95 %-100 %] 95 % (02/22 0822) FiO2 (%):  [28 %] 28 % (02/22 0822) Weight:  [82.1 kg] 82.1 kg (02/22 0300)  Physical Exam: General:Alert and responsive. NAD. Head: Normocephalic, no evidence injury, no tenderness, facial buttresses intact without stepoff.  Eyes: PERRL, EOMI. No scleral icterus, conjunctivae clear.  Nose: Normal skin and external support. Anterior rhinoscopy reveals healthy pink mucosa over the septum and turbinates.  Oral cavity:s/p dental extractions. Facial edema resolved. Neck: Trach in place. Breathing well via trach.No bleeding.   Recent Labs    02/22/19 0226 02/24/19 0111  WBC 9.4 7.7  HGB 11.1* 9.3*  HCT 32.4* 27.2*  PLT 280 252   Recent Labs    02/22/19 0226 02/24/19 0111  NA 134* 134*  K 3.6 3.7  CL 95* 98  CO2 26 27  GLUCOSE 109* 110*  BUN 12 11  CREATININE 0.89 0.83  CALCIUM 8.7* 8.2*    Medications:  I have reviewed the patient's current medications. Scheduled: . [MAR Hold] amLODipine  5 mg Oral Daily  . [MAR Hold] atorvastatin  80 mg Oral QPM  . [MAR Hold] chlorhexidine  15 mL Mouth Rinse BID  . [MAR Hold] Chlorhexidine Gluconate Cloth  6 each Topical Q0600  . [MAR Hold] feeding supplement (ENSURE ENLIVE)  237 mL Oral TID BM  . [MAR Hold]  HYDROmorphone (DILAUDID) injection  0.5 mg Intravenous Once  . [MAR Hold] lisinopril  20 mg Oral Daily  . [MAR Hold] naproxen  500 mg Oral BID WC  . [MAR Hold] pantoprazole  40 mg Oral BID   Continuous: . lactated ringers    . lactated ringers 75 mL/hr at 02/23/19 2020  . sodium chloride irrigation     PRN:[MAR Hold] acetaminophen **OR** [MAR Hold] acetaminophen, heparin  lock flush, [MAR Hold] hydrALAZINE, lidocaine, [MAR Hold]  morphine injection  Assessment/Plan: POD #6s/p trach and dental extractions. - On trach collar. Lurline Idol care teaching. Will leave the cuffed trach today due to his planned surgery. - ChemoXRT per medical and radiation oncology. - G-tube placement today. - Will change to an uncuffed tube tomorrow.   LOS: 6 days   Yohannes Waibel W Saber Dickerman 02/24/2019, 10:21 AM

## 2019-02-24 NOTE — Progress Notes (Signed)
PT Cancellation Note  Patient Details Name: DALEY GOSSE MRN: [id number] DOB: 13-Mar-1960   Cancelled Treatment:    Reason Eval/Treat Not Completed: Patient at procedure or test/unavailable. Will follow-up for PT evaluation as schedule permits.  Mabeline Caras, PT, DPT Acute Rehabilitation Services  Pager [phone] Office [place] 02/24/2019, 9:40 AM

## 2019-02-24 NOTE — Evaluation (Signed)
Physical Therapy Evaluation Patient Details Name: [name] MRN: [id number] DOB: [birth year] Today's Date: 02/24/2019   History of Present Illness  Pt is a 59 y.o. male who presented to [facility] on 02/18/19 for elective multiple dental extractions prior to initiating chemo and radiation therapy to newly diagnosed supraglottic posterior laryngeal squamous cell carcinoma.  During induction, airway was difficult and trach ultimately placed for airway protection. S/p G tube placement 2/22. PMH includes anxiety, carotid artery stenosis, chronic low back pain, CVA (2020).    Clinical Impression  Pt presents with an overall decrease in functional mobility secondary to above. PTA, pt independent and lives with supportive wife. Today, pt reports not having been OOB since admission; despite this, pt moving well, able to initiate transfers and hallway ambulation with min guard; SpO2 92% on 4L O2 via trach collar at 28% FiO2. Wife present and supportive; educ on precautions, positioning, activity recommendations and importance of more frequent mobility (including walking with nursing staff). Pt would benefit from continued acute PT services to maximize functional mobility and independence prior to d/c with HHPT services.     Follow Up Recommendations Home health PT;Supervision for mobility/OOB    Equipment Recommendations  None recommended by PT    Recommendations for Other Services       Precautions / Restrictions Precautions Precautions: Fall;Other (comment) Precaution Comments: 5L O2 via trach collar at 28% FiO2 (venturi mask in room for ambulation) Restrictions Weight Bearing Restrictions: No      Mobility  Bed Mobility Overal bed mobility: Modified Independent             General bed mobility comments: HOB elevated, assist for lines  Transfers Overall transfer level: Needs assistance Equipment used: None Transfers: Sit to/from Stand Sit to Stand: Min guard         General  transfer comment: Stood multiple times from bed and recliner with min guard for balance/safety with lines, no physical assist required  Ambulation/Gait Ambulation/Gait assistance: Min guard Gait Distance (Feet): 140 Feet Assistive device: Rolling walker (2 wheeled);None Gait Pattern/deviations: Step-through pattern;Decreased stride length   Gait velocity interpretation: 1.31 - 2.62 ft/sec, indicative of limited community ambulator General Gait Details: Initial gait training with RW, progressing to no DME with min guard for balance, pt moving well and happy to be out of room; SpO2 92% on 4L O2 via trach collar (28% FiO2)  Stairs            Wheelchair Mobility    Modified Rankin (Stroke Patients Only)       Balance Overall balance assessment: Needs assistance   Sitting balance-Leahy Scale: Good       Standing balance-Leahy Scale: Fair                               Pertinent Vitals/Pain Pain Assessment: Faces Faces Pain Scale: Hurts little more Pain Location: Abdomen Pain Descriptors / Indicators: Discomfort;Sore Pain Intervention(s): Monitored during session    Home Living Family/patient expects to be discharged to:: Private residence Living Arrangements: Spouse/significant other Available Help at Discharge: Family;Available 24 hours/day Type of Home: House Home Access: Stairs to enter Entrance Stairs-Rails: Psychiatric nurse of Steps: 3 Home Layout: One level Home Equipment: Grab bars - tub/shower;Cane - single point      Prior Function Level of Independence: Independent               Hand Dominance  Extremity/Trunk Assessment   Upper Extremity Assessment Upper Extremity Assessment: Generalized weakness    Lower Extremity Assessment Lower Extremity Assessment: Generalized weakness       Communication   Communication: Passy-Muir valve  Cognition Arousal/Alertness: Awake/alert Behavior During Therapy: WFL  for tasks assessed/performed Overall Cognitive Status: Within Functional Limits for tasks assessed                                        General Comments General comments (skin integrity, edema, etc.): Wife present and supportive    Exercises Other Exercises Other Exercises: Educ on seated/standing therex within confine of lines (when family or staff present for supervision) - including marching in place, repeated sit<>stands (without UE support)   Assessment/Plan    PT Assessment Patient needs continued PT services  PT Problem List Decreased strength;Decreased activity tolerance;Decreased balance;Decreased mobility;Cardiopulmonary status limiting activity;Pain       PT Treatment Interventions DME instruction;Gait training;Stair training;Functional mobility training;Therapeutic activities;Therapeutic exercise;Balance training;Patient/family education    PT Goals (Current goals can be found in the Care Plan section)  Acute Rehab PT Goals Patient Stated Goal: "I'm going home" PT Goal Formulation: With patient Time For Goal Achievement: 03/10/19 Potential to Achieve Goals: Good    Frequency Min 3X/week   Barriers to discharge        Co-evaluation               AM-PAC PT "6 Clicks" Mobility  Outcome Measure Help needed turning from your back to your side while in a flat bed without using bedrails?: None Help needed moving from lying on your back to sitting on the side of a flat bed without using bedrails?: None Help needed moving to and from a bed to a chair (including a wheelchair)?: A Little Help needed standing up from a chair using your arms (e.g., wheelchair or bedside chair)?: A Little Help needed to walk in hospital room?: A Little Help needed climbing 3-5 steps with a railing? : A Little 6 Click Score: 20    End of Session Equipment Utilized During Treatment: Oxygen Activity Tolerance: Patient tolerated treatment well Patient left: in  chair;with call bell/phone within reach;with family/visitor present Nurse Communication: Mobility status PT Visit Diagnosis: Other abnormalities of gait and mobility (R26.89);Muscle weakness (generalized) (M62.81)    Time: [id number] PT Time Calculation (min) (ACUTE ONLY): 46 min   Charges:   PT Evaluation $PT Eval Moderate Complexity: 1 Mod PT Treatments $Gait Training: 8-22 mins $Therapeutic Exercise: 8-22 mins   Mabeline Caras, PT, DPT Acute Rehabilitation Services  Pager [phone] Office [place] 02/24/2019, 5:19 PM

## 2019-02-24 NOTE — Transfer of Care (Signed)
Immediate Anesthesia Transfer of Care Note  Patient: [name]  Procedure(s) Performed: LAPAROSCOPIC INSERTION GASTROSTOMY TUBE (N/A Abdomen)  Patient Location: PACU  Anesthesia Type:General  Level of Consciousness: awake, alert  and oriented  Airway & Oxygen Therapy: Patient Spontanous Breathing and Patient connected to tracheostomy mask oxygen  Post-op Assessment: Report given to RN, Post -op Vital signs reviewed and stable and Patient moving all extremities  Post vital signs: Reviewed and stable  Last Vitals:  Vitals Value Taken Time  BP 180/95 02/24/19 1207  Temp 36.6 C 02/24/19 1205  Pulse 69 02/24/19 1210  Resp 18 02/24/19 1210  SpO2 97 % 02/24/19 1210  Vitals shown include unvalidated device data.  Last Pain:  Vitals:   02/24/19 0733  TempSrc:   PainSc: 7       Patients Stated Pain Goal: 2 (95/62/13 0865)  Complications: No apparent anesthesia complications

## 2019-02-24 NOTE — Progress Notes (Signed)
SLP Cancellation Note  Patient Details Name: [name] MRN: [id number] DOB: Feb 27, 1960   Cancelled treatment:       Reason Eval/Treat Not Completed: Patient at procedure or test/unavailable. Pt in a procedure    Jaelene Garciagarcia, Katherene Ponto 02/24/2019, 10:05 AM

## 2019-02-24 NOTE — Anesthesia Preprocedure Evaluation (Addendum)
Anesthesia Evaluation  Patient identified by MRN, date of birth, ID band Patient awake    Reviewed: Allergy & Precautions, NPO status , Patient's Chart, lab work & pertinent test results  Airway Mallampati: Trach       Dental   Pulmonary COPD, former smoker,  Quit smoking 2008, 96 pack year history Laryngeal ca s/p microlaryngoscopy, neck mass biopsy on 01/02/19.  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 Dental extractions/alveoloplasty recommended prior to chemoradiation.   Asymmetric soft tissue mass lesion within the left aryepiglottic fold extending to the supraglottic larynx measures at least 1.7 x 1.2 x 0.7 cm.    Pulmonary exam normal        Cardiovascular hypertension, Pt. on medications + CAD, + Cardiac Stents and +CHF  Normal cardiovascular exam  Diag cath 10/2012 for CP/abnormal nuc -> planned PCI s/p LAD atherectomy/DES placement 11/05/12.  Last echo 07/2018: 1. The left ventricle has normal systolic function, with an ejection  fraction of 55-60%. The cavity size was normal. There is mildly increased  left ventricular wall thickness. Left ventricular diastolic Doppler  parameters are consistent with impaired  relaxation.  2. The right ventricle has normal systolic function. The cavity was  normal. There is no increase in right ventricular wall thickness. Right  ventricular systolic pressure could not be assessed.  3. Aortic valve regurgitation is trivial by color flow Doppler. No  stenosis of the aortic valve.  4. No intracardiac thrombi or masses were visualized.  5. The interatrial septum was not well visualized.  6. The inferior vena cava was normal in size with <50% respiratory  variability.   Plavix was held following 02/13/19 dental visit. Reportedly, he is to continue ASA.    Neuro/Psych PSYCHIATRIC DISORDERS Anxiety 07/31/2018  bilateral vertebrobasilar occlusion; "left side weaker thanit was before."   Carotid US 02/12/19: - Right Carotid: Velocities in the right ICA are consistent with a 40-59% stenosis.  - Left Carotid: Velocities in the left ICA are consistent with a 1-39% stenosis.  CVA, Residual Symptoms    GI/Hepatic hiatal hernia, GERD  ,(+)     substance abuse  alcohol use and marijuana use,   Endo/Other  negative endocrine ROS  Renal/GU negative Renal ROS  negative genitourinary   Musculoskeletal  (+) Arthritis , Osteoarthritis,    Abdominal   Peds  Hematology negative hematology ROS (+)   Anesthesia Other Findings S/p tracheostomy on 02/18/19 due to difficult airway  Reproductive/Obstetrics                            Anesthesia Physical  Anesthesia Plan  ASA: III  Anesthesia Plan: General   Post-op Pain Management:    Induction: Intravenous and Inhalational  PONV Risk Score and Plan: 2 and Ondansetron, Treatment may vary due to age or medical condition, Midazolam and Dexamethasone  Airway Management Planned: Tracheostomy  Additional Equipment: None  Intra-op Plan:   Post-operative Plan:   Informed Consent: I have reviewed the patients History and Physical, chart, labs and discussed the procedure including the risks, benefits and alternatives for the proposed anesthesia with the patient or authorized representative who has indicated his/her understanding and acceptance.     Dental advisory given  Plan Discussed with:   Anesthesia Plan Comments:         Anesthesia Quick Evaluation  Anesthesia Evaluation  Patient identified by MRN, date of birth, ID band Patient awake    Reviewed: Allergy & Precautions, NPO status , Patient's Chart, lab work & pertinent test results  Airway Mallampati: I  TM Distance: >3 FB Neck ROM: Full    Dental no notable dental hx. (+) Poor Dentition,    Pulmonary former  smoker,    Pulmonary exam normal breath sounds clear to auscultation       Cardiovascular hypertension, Pt. on medications + CAD and + Cardiac Stents  Normal cardiovascular exam Rhythm:Regular Rate:Normal     Neuro/Psych Anxiety L sided weakeness CVA    GI/Hepatic Neg liver ROS, GERD  Medicated,  Endo/Other  negative endocrine ROS  Renal/GU Cr 1.05     Musculoskeletal   Abdominal   Peds  Hematology negative hematology ROS (+)   Anesthesia Other Findings   Reproductive/Obstetrics                           Anesthesia Physical Anesthesia Plan  ASA: III  Anesthesia Plan: General   Post-op Pain Management:    Induction: Intravenous  PONV Risk Score and Plan: Treatment may vary due to age or medical condition and Ondansetron  Airway Management Planned: Oral ETT  Additional Equipment:   Intra-op Plan:   Post-operative Plan: Extubation in OR  Informed Consent: I have reviewed the patients History and Physical, chart, labs and discussed the procedure including the risks, benefits and alternatives for the proposed anesthesia with the patient or authorized representative who has indicated his/her understanding and acceptance.     Dental advisory given  Plan Discussed with: CRNA  Anesthesia Plan Comments: (PAT note written 01/01/2019 by Myra Gianotti, PA-C. SAME DAY WORK-UP   )       Anesthesia Quick Evaluation

## 2019-02-24 NOTE — Progress Notes (Signed)
START ON PATHWAY REGIMEN - Head and Neck     A cycle is every 7 days:     Cisplatin   **Always confirm dose/schedule in your pharmacy ordering system**  Patient Characteristics: Larynx, Stage III, IVA, IVB; Unresectable Disease Classification: Larynx Current Disease Status: No Distant Metastases and No Recurrent Disease AJCC T Category: T3 AJCC 8 Stage Grouping: IVA AJCC N Category: cN2c AJCC M Category: M0 Intent of Therapy: Curative Intent, Discussed with Patient

## 2019-02-24 NOTE — Op Note (Signed)
02/24/2019  11:54 AM  PATIENT:  [name]  59 y.o. male  PRE-OPERATIVE DIAGNOSIS:  Laryngeal Cancer; need for enteral access  POST-OPERATIVE DIAGNOSIS:  same  PROCEDURE:  Procedure(s): LAPAROSCOPIC INSERTION 18 Fr STAMM GASTROSTOMY TUBE  SURGEON:  Surgeon(s): [name], MD  ASSISTANTS: Alferd Apa, PA-C   ANESTHESIA:   general  DRAINS: Gastrostomy Tube   LOCAL MEDICATIONS USED:  MARCAINE     SPECIMEN:  No Specimen  DISPOSITION OF SPECIMEN:  N/A  COUNTS:  YES  INDICATION FOR PROCEDURE: 59 year old gentleman with a history of anxiety, carotid artery stenosis, chronic low back pain, CVA 2020 with a recent diagnosis of laryngeal squamous cell carcinoma who underwent dental extractions last week prior to initiating chemotherapy and radiation ended up having urgent tracheostomy placed during that procedure and we were asked to consult for placement of a gastrotomy tube for enteral nutrition given his upcoming radiation needs.  I discussed at length the risk and benefits of the procedure including the aftercare which are separately documented.  PROCEDURE: Patient was giving 5000 units of subcutaneous heparin prior to surgery.  After obtaining informed consent he was taken to the OR 1 at [facility] placed supine on the operating table.  His tracheostomy was connected to the anesthesia circuit & general anesthesia was established.  His abdomen was prepped and draped in the usual standard surgical fashion.  He received IV antibiotic this morning.  Surgical timeout was performed.  He had had a prior open umbilical hernia repair with mesh so I decided to gain access to the abdomen using the Optiview technique in the right upper quadrant..  A small right midclavicular subcostal incision was made.  Then using a 0 degree 5 mm laparoscope through a 5 mm trocar I advanced it under direct visualization into the abdominal cavity.  Pneumoperitoneum was smoothly established up to a  patient pressure of 15 mmHg.  There was no evidence of injury to surrounding structures.  The laparoscope was advanced and there is no evidence of injury.  He did have small bowel adhered to his umbilical hernia mesh.  I placed an 8 mm trocar in the right midabdomen, a 5 mm trocar just to the left of the hernia mesh at the umbilicus and ultimately a 5 mm trocar in the left upper quadrant about 1-1/2 fingerbreadths below the left subcostal margin in the midclavicular line after confirming that the stomach would reach this location.  Identified a portion of the distal stomach anteriorly that would reach the anterior abdominal wall in the left upper quadrant.  2 stay sutures of 2-0 silk sutures were placed into the anterior wall of the stomach.  I then made a gastrotomy with the L-hook between these 2 stay sutures while they were being held up so I would not backwall the stomach.  A [place] was used to further open the gastrotomy.  I was intragastric and not submucosal.  At this point I placed a 2-0 silk suture in a pursestring fashion around the gastrotomy.  We then obtained an 33 French MIC gastrotomy tube and tested the balloon and it was intact.  The G-tube was placed through the left upper quadrant 5 mm trocar site and advanced into the stomach.  The balloon was inflated with 8 cc of saline.  I then tied down the previously placed pursestring suture.  The needle was removed from the pursestring suture.  At this point the tube was advanced further into the gastric body.  I then  placed a four 2-0 silk sutures in a circumferential manner around the G-tube to serve as a Stamm gastrotomy tube placement.  We then brought up each of the 4 silk sutures through the anterior abdominal wall transfascially after lowering the pneumoperitoneum.  Each of the stay sutures were tied down.  The stomach was flush against the abdominal wall.  The G-tube was pulled back with the balloon being flush against the anterior wall.  The  balloon was still intact.  I then secured the G-tube to the skin with a 2-0 nylon as well as with the 0 silk sutures.  Local was infiltrated in bilateral lateral abdominal walls a tap block.  The 8 mm trocar site was closed with 0 Vicryl in a PMI suture passer.  There is no evidence of bleeding.  No evidence of injury to surrounding structures.  Pneumoperitoneum was released.  The remaining trocar sites were closed with a 4 Monocryl in a subcuticular fashion followed by the application of Dermabond on those incision sites along with our transfascial suture sites.  Patient tolerated suture well.  There were no immediate complications.  The patient was taken to the recovery room in stable condition.  All needle, instrument, and sponge counts were correct x2  PLAN OF CARE: Admit to inpatient   PATIENT DISPOSITION:  PACU - hemodynamically stable.   Delay start of Pharmacological VTE agent (>24hrs) due to surgical blood loss or risk of bleeding:  no  Leighton Ruff. Redmond Pulling, MD, FACS General, Bariatric, & Minimally Invasive Surgery [facility], [place]

## 2019-02-25 ENCOUNTER — Inpatient Hospital Stay (HOSPITAL_COMMUNITY): Payer: 59

## 2019-02-25 ENCOUNTER — Inpatient Hospital Stay (HOSPITAL_COMMUNITY): Payer: 59 | Admitting: General Practice

## 2019-02-25 DIAGNOSIS — C321 Malignant neoplasm of supraglottis: Secondary | ICD-10-CM

## 2019-02-25 DIAGNOSIS — K219 Gastro-esophageal reflux disease without esophagitis: Secondary | ICD-10-CM

## 2019-02-25 LAB — BASIC METABOLIC PANEL
Anion gap: 10 (ref 5–15)
BUN: 8 mg/dL (ref 6–20)
CO2: 27 mmol/L (ref 22–32)
Calcium: 8.6 mg/dL — ABNORMAL LOW (ref 8.9–10.3)
Chloride: 97 mmol/L — ABNORMAL LOW (ref 98–111)
Creatinine, Ser: 0.86 mg/dL (ref 0.61–1.24)
GFR calc Af Amer: >60 mL/min (ref >60)
GFR calc non Af Amer: >60 mL/min (ref >60)
Glucose, Bld: 117 mg/dL — ABNORMAL HIGH (ref 70–99)
Potassium: 4 mmol/L (ref 3.5–5.1)
Sodium: 134 mmol/L — ABNORMAL LOW (ref 135–145)

## 2019-02-25 LAB — CBC
HCT: 27.9 % — ABNORMAL LOW (ref 39.0–52.0)
Hemoglobin: 9.4 g/dL — ABNORMAL LOW (ref 13.0–17.0)
MCH: 32 pg (ref 26.0–34.0)
MCHC: 33.7 g/dL (ref 30.0–36.0)
MCV: 94.9 fL (ref 80.0–100.0)
Platelets: 274 10*3/uL (ref 150–400)
RBC: 2.94 MIL/uL — ABNORMAL LOW (ref 4.22–5.81)
RDW: 12.5 % (ref 11.5–15.5)
WBC: 7.5 10*3/uL (ref 4.0–10.5)
nRBC: 0 % (ref 0.0–0.2)

## 2019-02-25 MED ORDER — ALUM & MAG HYDROXIDE-SIMETH 200-200-20 MG/5ML PO SUSP: 30.0000 mL | Freq: Four times a day (QID) | ORAL | Status: DC | PRN

## 2019-02-25 MED ORDER — SODIUM CHLORIDE 0.9 % IR SOLN: 200.0000 mL | 0 refills | Status: DC

## 2019-02-25 MED ORDER — NAPROXEN 250 MG PO TABS: 250.0000 mg | ORAL_TABLET | Freq: Two times a day (BID) | ORAL | 0 refills | Status: DC

## 2019-02-25 MED ORDER — ARGYLE SUCTION CATHETER 14FR MISC: 1.0000 | Freq: Every day | 0 refills | Status: DC

## 2019-02-25 MED ORDER — TRACHEOSTOMY CARE KIT: 1.0000 | PACK | Freq: Every day | 0 refills | Status: DC

## 2019-02-25 MED ORDER — ALUM & MAG HYDROXIDE-SIMETH 200-200-20 MG/5ML PO SUSP
30.0000 mL | Freq: Once | ORAL | Status: AC
  Administered 2019-02-25: 30 mL via ORAL
  Filled 2019-02-25: qty 30

## 2019-02-25 MED ORDER — ENSURE ENLIVE PO LIQD: 237.0000 mL | Freq: Three times a day (TID) | ORAL | Status: DC

## 2019-02-25 MED ORDER — LIDOCAINE VISCOUS HCL 2 % MT SOLN
15.0000 mL | Freq: Once | OROMUCOSAL | Status: AC
  Administered 2019-02-25: 15 mL via ORAL
  Filled 2019-02-25: qty 15

## 2019-02-25 NOTE — Progress Notes (Signed)
  Speech Language Pathology Treatment: Nada Boozer Speaking valve  Patient Details Name: [name] MRN: [id number] DOB: [birth year] Today's Date: 02/25/2019 Time: 1444-5848 SLP Time Calculation (min) (ACUTE ONLY): 18 min  Assessment / Plan / Recommendation Clinical Impression  Pt preparing for D/C home today. Trach changed to a #6 cuffless. Wife and daughter present for education.  Reviewed results from [facility] and encouraged pt to eat/drink as much as reasonably able.  MBS showed reliable airway protection and mild residuals that clear with a f/u swallow.  Pt should use PMV during all PO intake.  We also reviewed donning/doffing of PMV, care with washing in soapy water, rinsing daily and letting air-dry in container overnight.  Valve should be worn all waking hours and removed for sleep.  We reviewed how critical it is for pt to participate in OP SLP as Rx treatment begins - we discussed the potential for radiation-induced dysphagia, and the benefit of establishing an exercise program early on to help stave off the effects of radiation.  Pt/family verbalized understanding.      HPI HPI: 59 year old male patient who presented to [name] for elective multiple dental extractions prior to initiating chemo and radiation therapy to newly diagnosed supraglottic posterior laryngeal squamous cell carcinoma on 2/16. Hx of Anxiety,  During induction airway was quite difficult with friable tissue requiring several attempts before airway cannulated.  Dental extraction was completed.  It was decided to proceed with tracheostomy for airway protection given concern for airway obstruction. Had outpatient BSE on 02/05/19, recommended thin liquids and regular diet.       SLP Plan  All goals met       Recommendations  Diet recommendations: Regular;Thin liquid Liquids provided via: Cup;Straw Medication Administration: Via alternative means Supervision: Patient able to self feed                Oral Care  Recommendations: Oral care BID Follow up Recommendations: Outpatient SLP SLP Visit Diagnosis: Dysphagia, pharyngeal phase (R13.13) Plan: All goals met       GO                [name] 02/25/2019, 1:58 PM  Rynell Ciotti L. Tivis Ringer, [place] Office number [phone]

## 2019-02-25 NOTE — Progress Notes (Signed)
PROGRESS NOTE    [name]    Code Status: Full Code  XLK:[id number] DOB: 10/22/1960 DOA: 02/18/2019 LOS: 7 days  PCP: [name], MD CC: No chief complaint on file.      Hospital Summary  [name] is a 59 y.o. 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  Due to significant bleeding and trauma there was concern for airway protection and ENT was consulted who decided to proceed with cuffed tracheostomy after his dental procedure and in ICU for observation.  He was transferred out of the ICU in [facility] assumed care on 2/18.  He was since evaluated by SLP.  IR commended general surgery consult for G tube insertion due to abnormal gastric anatomy and IR to place Port-A-Cath in anticipation of starting chemotherapy.  Due to leukocytosis he was started on Ancef per dental and ENT team which improved and This was discontinued 2/19.   2/19 underwent IR guided RUE dual-lumen power PICC instead of Port-A-Cath for chemotherapy.  General surgery consulted for G-tube placement and recommended procedure on Monday. 2/21: Left Great Toe pain, suspect Gout. Started Naproxen 2/22: G tube placement 2/23: trach changed to uncuffed #6 Shiley by ENT. Toe pain resolved and having GERD, NSAID discontinued. Medically stable for discharge but needs trach supplies delivered to home. DC cancelled. Plan for discharge tomorrow once supplies are delivered.  A & P   Active Problems:   Malignant neoplasm of supraglottis (HCC)   Hx of tracheostomy   Head and neck cancer ([place])   1. Newly  diagnosed supraglottic posterior laryngeal squamous cell carcinoma complicated by traumatic intubation during dental procedure, s/p tracheostomy placement, POD 7 a. 2/19 PICC placed by IR for anticipation of chemotherapy b. 2/22 G-tube placement in anticipation of possible change to NPO in the future - can have PO intake but must flush tube daily c. 2/23: trach changed to uncuffed #6 Shiley by ENT.  d. Completed 4 days Ancef for concern of leukocytosis and possible infection during procedure.  Discontinued 2/19 e. Copious trach secretions with report of blood around trach by RRT 2/19, unlikely infectious, more likely due to chronic issue. 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 Also tobacco use from age 58, quit 11 years go. f. Oncology follow up scheduled for 3/2 2. Left Great Toe Pain, concern for gout vs. Pseudogout a. Xray unremarkable b. ESR and CRP elevated, uric acid low c. Discussed with Ortho, difficult to obtain joint aspirate from left great toe. Recommended to treat empirically for gout d. Near resolution with naproxen and PPI but having persistent GERD so NSAID discontinued 3. GERD a. NSAID discontinued. Was not improving with PPI b. Maalox once added 4. Hypertension a. Continue lisinopril and amlodipine  5. Low back pain a. Heating pad 6. Depression stable a. Does not wish to start SSRI 7. Compensated HFpEF 8. CAD a. statin  b. Can restart antiplatelets at discharge    DVT prophylaxis: SCDs Family Communication: Wife updated today Disposition Plan:   Patient came from:  home  Anticipated d/c place: home  Barriers to d/c: delivery of supplies. Anticipate discharge tomorrow, 2/24. Orders placed  Pressure injury documentation    None  Consultants  IR Dentistry ENT PCCM   Procedures  2/16 multiple  dental extractions 2/16 tracheostomy placement  Antibiotics   Anti-infectives (From admission, onward)   Start     Dose/Rate Route Frequency Ordered Stop   02/24/19 0600  ceFAZolin (ANCEF) IVPB 2g/100 mL premix     2 g 200 mL/hr over 30 Minutes Intravenous  Once 02/23/19 0653 02/24/19 0640   02/19/19 1200  ceFAZolin (ANCEF) IVPB 2g/100 mL premix  Status:  Discontinued     2 g 200 mL/hr over 30 Minutes Intravenous Every 8 hours 02/19/19 1045 02/21/19 1638   02/19/19 1045  ceFAZolin (ANCEF) IVPB 2g/100 mL premix  Status:  Discontinued     2 g 200 mL/hr over 30 Minutes Intravenous Every 8 hours 02/19/19 1032 02/19/19 1045   02/18/19 0615  ceFAZolin (ANCEF) IVPB 2g/100 mL premix     2 g 200 mL/hr over 30 Minutes Intravenous  Once 02/18/19 0610 02/18/19 0753   02/18/19 0613  ceFAZolin (ANCEF) 2-4 GM/100ML-% IVPB    Note to Pharmacy: Maryjean Ka   : cabinet override      02/18/19 4562 02/18/19 0801        Subjective   Patient seen and examined at bedside no acute distress and resting comfortably.  No events overnight.  Had not eaten by the time of my arrival.  Complaining of acid reflux throughout the night.  States his toe pain has resolved.  Anxious to go home  Denies any chest pain, shortness of breath, fever, nausea, vomiting, urinary complaints.  Otherwise ROS negative   Objective   Vitals:   02/25/19 0845 02/25/19 1108 02/25/19 1158 02/25/19 1500  BP: (!) 184/94 (!) 132/105  139/75  Pulse:   72   Resp: '14 12  15  '$ Temp: 98.5 F (36.9 C) 98.4 F (36.9 C)  98.3 F (36.8 C)  TempSrc: Oral Oral  Oral  SpO2: 100% 97%  98%  Weight:      Height:        Intake/Output Summary (Last 24 hours) at 02/25/2019 1602 Last data filed at 02/25/2019 0200 Gross per 24 hour  Intake 180 ml  Output 900 ml  Net -720 ml   Filed Weights   02/20/19 0500 02/21/19 0500 02/24/19 0300  Weight: 82.1 kg 83 kg 82.1 kg    Examination:  Physical Exam Vitals and nursing note reviewed.   Constitutional:      Appearance: Normal appearance.  HENT:     Head: Normocephalic and atraumatic.  Eyes:     Conjunctiva/sclera: Conjunctivae normal.  Neck:     Comments: Tracheostomy Cardiovascular:     Rate and Rhythm: Normal rate and regular rhythm.  Pulmonary:     Effort: Pulmonary effort is normal.     Breath sounds: Normal breath sounds.  Abdominal:     General: Abdomen is flat.     Palpations: Abdomen is soft.     Comments: G-tube without signs of erythema or tenderness  Musculoskeletal:        General: No swelling or tenderness.  Skin:    Coloration: Skin is not jaundiced or pale.  Neurological:     Mental Status: He is alert. Mental status is at baseline.  Psychiatric:        Mood and Affect: Mood normal.        Behavior:  Behavior normal.     Data Reviewed: I have personally reviewed following labs and imaging studies  CBC: Recent Labs  Lab 02/20/19 0809 02/21/19 0447 02/22/19 0226 02/24/19 0111 02/25/19 0235  WBC 10.3 8.7 9.4 7.7 7.5  NEUTROABS  --  6.7  --   --   --   HGB 11.2* 10.6* 11.1* 9.3* 9.4*  HCT 34.0* 31.7* 32.4* 27.2* 27.9*  MCV 97.7 95.5 94.5 94.4 94.9  PLT 258 249 280 252 622   Basic Metabolic Panel: Recent Labs  Lab 02/20/19 0809 02/21/19 0447 02/22/19 0226 02/24/19 0111 02/25/19 0235  NA 135 132* 134* 134* 134*  K 3.6 3.4* 3.6 3.7 4.0  CL 98 96* 95* 98 97*  CO2 '26 25 26 27 27  '$ GLUCOSE 104* 108* 109* 110* 117*  BUN '12 13 12 11 8  '$ CREATININE 1.03 0.91 0.89 0.83 0.86  CALCIUM 9.0 8.8* 8.7* 8.2* 8.6*   GFR: Estimated Creatinine Clearance: 99.1 mL/min (by C-G formula based on SCr of 0.86 mg/dL). Liver Function Tests: No results for input(s): AST, ALT, ALKPHOS, BILITOT, PROT, ALBUMIN in the last 168 hours. No results for input(s): LIPASE, AMYLASE in the last 168 hours. No results for input(s): AMMONIA in the last 168 hours. Coagulation Profile: Recent Labs  Lab 02/21/19 0447  INR 1.0   Cardiac Enzymes: No results for  input(s): CKTOTAL, CKMB, CKMBINDEX, TROPONINI in the last 168 hours. BNP (last 3 results) No results for input(s): PROBNP in the last 8760 hours. HbA1C: No results for input(s): HGBA1C in the last 72 hours. CBG: No results for input(s): GLUCAP in the last 168 hours. Lipid Profile: No results for input(s): CHOL, HDL, LDLCALC, TRIG, CHOLHDL, LDLDIRECT in the last 72 hours. Thyroid Function Tests: No results for input(s): TSH, T4TOTAL, FREET4, T3FREE, THYROIDAB in the last 72 hours. Anemia Panel: No results for input(s): VITAMINB12, FOLATE, FERRITIN, TIBC, IRON, RETICCTPCT in the last 72 hours. Sepsis Labs: No results for input(s): PROCALCITON, LATICACIDVEN in the last 168 hours.  Recent Results (from the past 240 hour(s))  SARS CORONAVIRUS 2 (TAT 6-24 HRS) Nasopharyngeal Nasopharyngeal Swab     Status: None   Collection Time: 02/17/19  2:48 PM   Specimen: Nasopharyngeal Swab  Result Value Ref Range Status   SARS Coronavirus 2 NEGATIVE NEGATIVE Final    Comment: (NOTE) SARS-CoV-2 target nucleic acids are NOT DETECTED. The SARS-CoV-2 RNA is generally detectable in upper and lower respiratory specimens during the acute phase of infection. Negative results do not preclude SARS-CoV-2 infection, do not rule out co-infections with other pathogens, and should not be used as the sole basis for treatment or other patient management decisions. Negative results must be combined with clinical observations, patient history, and epidemiological information. The expected result is Negative. Fact Sheet for Patients: SugarRoll.be Fact Sheet for Healthcare Providers: https://www.woods-mathews.com/ This test is not yet approved or cleared by the [place] FDA and  has been authorized for detection and/or diagnosis of SARS-CoV-2 by FDA under an Emergency Use Authorization (EUA). This EUA will remain  in effect (meaning this test can be used) for the  duration of the COVID-19 declaration under Section 56 4(b)(1) of the Act, 21 U.S.C. section 360bbb-3(b)(1), unless the authorization is terminated or revoked sooner. Performed at [facility], [place] [address] [place],  [address]   MRSA PCR Screening     Status: None   Collection Time: 02/18/19  5:00 PM  Result Value Ref Range Status   MRSA by PCR NEGATIVE  NEGATIVE Final    Comment:        The GeneXpert MRSA Assay (FDA approved for NASAL specimens only), is one component of a comprehensive MRSA colonization surveillance program. It is not intended to diagnose MRSA infection nor to guide or monitor treatment for MRSA infections. Performed at [facility], [place] [address] [place], [place] [address]   Culture, blood (Routine X 2) w Reflex to ID Panel     Status: None   Collection Time: 02/19/19 11:18 AM   Specimen: BLOOD  Result Value Ref Range Status   Specimen Description BLOOD RIGHT ANTECUBITAL  Final   Special Requests   Final    BOTTLES DRAWN AEROBIC AND ANAEROBIC Blood Culture results may not be optimal due to an excessive volume of blood received in culture bottles   Culture   Final    NO GROWTH 5 DAYS Performed at [facility], [place] [address], [place], [place] [address]    Report Status 02/24/2019 FINAL  Final  Culture, blood (Routine X 2) w Reflex to ID Panel     Status: None   Collection Time: 02/19/19 11:18 AM   Specimen: BLOOD RIGHT ARM  Result Value Ref Range Status   Specimen Description BLOOD RIGHT ARM  Final   Special Requests   Final    BOTTLES DRAWN AEROBIC AND ANAEROBIC Blood Culture results may not be optimal due to an excessive volume of blood received in culture bottles   Culture   Final    NO GROWTH 5 DAYS Performed at [facility], [place] [address], [place],  [address]    Report Status 02/24/2019 FINAL  Final         Radiology Studies: No results found.      Scheduled Meds: . alum & mag  hydroxide-simeth  30 mL Oral Once   And  . lidocaine  15 mL Oral Once  . amLODipine  5 mg Oral Daily  . atorvastatin  80 mg Oral QPM  . chlorhexidine  15 mL Mouth Rinse BID  . Chlorhexidine Gluconate Cloth  6 each Topical Q0600  . feeding supplement (ENSURE ENLIVE)  237 mL Oral TID BM  .  HYDROmorphone (DILAUDID) injection  0.5 mg Intravenous Once  . lipase/protease/amylase)  20,880 Units Per Tube Once   And  . sodium bicarbonate  650 mg Per Tube Once  . lisinopril  20 mg Oral Daily  . pantoprazole  40 mg Oral BID   Continuous Infusions: . lactated ringers    . lactated ringers 75 mL/hr at 02/24/19 1957  . sodium chloride irrigation       Time spent: 20 minutes with over 50% of the time coordinating the patient's care    Harold Hedge, DO [facility] Pager [phone]  Call night coverage person covering after 7pm

## 2019-02-25 NOTE — Progress Notes (Signed)
POST OPERATIVE NOTE: Post op Day #7  02/25/2019 Vear Clock [id number]   VITALS: BP (!) 132/105   Pulse 72   Temp 98.4 F (36.9 C) (Oral)   Resp 12   Ht 5\' 8"  (1.727 m)   Wt 82.1 kg   SpO2 97%   BMI 27.52 kg/m   LABS:  Lab Results  Component Value Date   WBC 7.5 02/25/2019   HGB 9.4 (L) 02/25/2019   HCT 27.9 (L) 02/25/2019   MCV 94.9 02/25/2019   PLT 274 02/25/2019   BMET    Component Value Date/Time   NA 134 (L) 02/25/2019 0235   K 4.0 02/25/2019 0235   CL 97 (L) 02/25/2019 0235   CO2 27 02/25/2019 0235   GLUCOSE 117 (H) 02/25/2019 0235   BUN 8 02/25/2019 0235   BUN 13 12/24/2018 0918   CREATININE 0.86 02/25/2019 0235   CREATININE 1.23 10/28/2012 1558   CALCIUM 8.6 (L) 02/25/2019 0235   GFRNONAA >60 02/25/2019 0235   GFRAA >60 02/25/2019 0235    Lab Results  Component Value Date   INR 1.0 02/21/2019   INR 1.0 08/02/2018   INR 0.99 11/05/2012   No results found for: PTT   KYM FENTER is status post extractions with alveoloplasty in the OR on 02/18/19.  SUBJECTIVE: Patient denies significant discomfort from dental extraction sites.  Stitches are still present.  EXAM: There is no sign of infection, heme, or ooze intraorally.  Sutures are intact.  Clots are present.  Ecchymoses are still present.  Intraoral and extraoral swelling are significantly decreased.   ASSESSMENT: Post operative course is consistent with dental procedures performed in the operating room Loss of teeth due to extraction Patient is now completely edentulous There is atrophy of the edentulous alveolar ridges  PLAN: 1.  Continue salt water rinses every 2 hours while awake to aid healing. 2.  Advance diet as tolerated with use of nutritional supplements suggested by nutritional consultation. 3.  Return to clinic for evaluation for suture removal once discharged. 4.  Patient is ready for discharge from dental standpoint.  [name], [name]

## 2019-02-25 NOTE — Progress Notes (Signed)
Per Dr. Neysa Bonito d/c pt with picc line intact.

## 2019-02-25 NOTE — Progress Notes (Signed)
SATURATION QUALIFICATIONS: (This note is used to comply with regulatory documentation for home oxygen)  Patient Saturations on Room Air at Rest = 96%  Patient Saturations on Room Air while Ambulating = 91%  Patient ambulated around nurses station and back. Did not require O2 and reports no SOB.

## 2019-02-25 NOTE — Evaluation (Signed)
Occupational Therapy Evaluation Patient Details Name: [name] MRN: [id number] DOB: [birth year] Today's Date: 02/25/2019    History of Present Illness Pt is a 59 y.o. male who presented to [facility] on 02/18/19 for elective multiple dental extractions prior to initiating chemo and radiation therapy to newly diagnosed supraglottic posterior laryngeal squamous cell carcinoma.  During induction, airway was difficult and trach ultimately placed for airway protection. S/p G tube placement 2/22. PMH includes anxiety, carotid artery stenosis, chronic low back pain, CVA (2020).   Clinical Impression   Patient is a 59 year old male that lives with his spouse in a double wide trailer with 3 steps to enter. Patient reports independent at baseline with occasional use of a cane for outdoor mobility. Currently patient is supervision level for self care and functional transfers for safety. Initiated energy conservation strategies for home. Currently no OT follow up recommended for home.     Follow Up Recommendations  No OT follow up;Supervision - Intermittent    Equipment Recommendations  Tub/shower seat;Other (comment)(hand held shower head)       Precautions / Restrictions Precautions Precautions: Fall;Other (comment) Precaution Comments: trach, on room air with passey muir valvue Restrictions Weight Bearing Restrictions: No      Mobility Bed Mobility Overal bed mobility: Modified Independent             General bed mobility comments: HOB elevated, assist for lines  Transfers Overall transfer level: Needs assistance Equipment used: None Transfers: Sit to/from Stand Sit to Stand: Supervision         General transfer comment: patient stood from edge of bed multiple times, transferred to recliner without physical assist    Balance Overall balance assessment: Needs assistance Sitting-balance support: No upper extremity supported;Feet supported Sitting balance-Leahy Scale: Good      Standing balance support: No upper extremity supported;During functional activity Standing balance-Leahy Scale: Fair Standing balance comment: pt reports feeling mildly unsteady due to feet being sore, no loss of balance                           ADL either performed or assessed with clinical judgement   ADL Overall ADL's : Needs assistance/impaired     Grooming: Supervision/safety;Sitting;Standing   Upper Body Bathing: Supervision/ safety;Sitting;Standing   Lower Body Bathing: Supervison/ safety;Sitting/lateral leans;Sit to/from stand   Upper Body Dressing : Supervision/safety;Sitting;Standing   Lower Body Dressing: Supervision/safety;Sitting/lateral leans;Sit to/from stand Lower Body Dressing Details (indicate cue type and reason): pt able to pull up socks while seated at edge of bed Toilet Transfer: Supervision/safety;Ambulation;Regular Glass blower/designer Details (indicate cue type and reason): pt stand at toilet to urinate supervision level, supervision with sit to stand from edge of bed         Functional mobility during ADLs: Supervision/safety General ADL Comments: patient close to baseline with self care tasks, initiate energy conservation strategies for home also in preparation for upcoming cancer treatments.     Vision Baseline Vision/History: Wears glasses Wears Glasses: Reading only Patient Visual Report: Other (comment)(patient reports mild blurriness since CVA in July)              Pertinent Vitals/Pain Pain Assessment: Faces Faces Pain Scale: Hurts a little bit Pain Location: bilateral feet Pain Descriptors / Indicators: Sore Pain Intervention(s): Monitored during session     Hand Dominance Right   Extremity/Trunk Assessment Upper Extremity Assessment Upper Extremity Assessment: Overall WFL for tasks assessed   Lower Extremity Assessment  Lower Extremity Assessment: Defer to PT evaluation   Cervical / Trunk Assessment Cervical /  Trunk Assessment: Normal   Communication Communication Communication: Passy-Muir valve   Cognition Arousal/Alertness: Awake/alert Behavior During Therapy: WFL for tasks assessed/performed Overall Cognitive Status: Within Functional Limits for tasks assessed                                                Home Living Family/patient expects to be discharged to:: Private residence Living Arrangements: Spouse/significant other Available Help at Discharge: Family;Available 24 hours/day Type of Home: Other(Comment)(double wide trailer) Home Access: Stairs to enter Entrance Stairs-Number of Steps: 3 Entrance Stairs-Rails: Right;Left Home Layout: One level     Bathroom Shower/Tub: Teacher, early years/pre: Standard     Home Equipment: Grab bars - tub/shower;Kasandra Knudsen - single point      Lives With: Spouse    Prior Functioning/Environment Level of Independence: Independent        Comments: patient reports his disability recently went through        OT Problem List: Decreased activity tolerance;Impaired balance (sitting and/or standing)      OT Treatment/Interventions: Self-care/ADL training;Therapeutic exercise;Energy conservation;DME and/or AE instruction;Therapeutic activities;Patient/family education;Balance training    OT Goals(Current goals can be found in the care plan section) Acute Rehab OT Goals Patient Stated Goal: "I'm going home" OT Goal Formulation: With patient Time For Goal Achievement: 03/11/19 Potential to Achieve Goals: Good  OT Frequency: Min 2X/week    AM-PAC OT "6 Clicks" Daily Activity     Outcome Measure Help from another person eating meals?: None Help from another person taking care of personal grooming?: A Little Help from another person toileting, which includes using toliet, bedpan, or urinal?: A Little Help from another person bathing (including washing, rinsing, drying)?: A Little Help from another person to put on  and taking off regular upper body clothing?: A Little Help from another person to put on and taking off regular lower body clothing?: A Little 6 Click Score: 19   End of Session Nurse Communication: Mobility status  Activity Tolerance: Patient tolerated treatment well Patient left: in chair;with call bell/phone within reach  OT Visit Diagnosis: Unsteadiness on feet (R26.81)                Time: 9292-4462 OT Time Calculation (min): 32 min Charges:  OT General Charges $OT Visit: 1 Visit OT Evaluation $OT Eval Moderate Complexity: 1 Mod OT Treatments $Self Care/Home Management : 8-22 mins  Shon Millet OT OT office: [place] 02/25/2019, 12:23 PM

## 2019-02-25 NOTE — Progress Notes (Signed)
[name] Initial Psychosocial Assessment Clinical Social Work  Clinical Social Work contacted by phone to assess psychosocial, emotional, mental health, and spiritual needs of the patient.   Barriers to care/review of distress screen:  - Transportation:  Do you anticipate any problems getting to appointments?  Do you have someone who can help run errands for you if you need it? Wife will transport as needed, no concerns.  - Help at home:  What is your living situation (alone, family, other)?  If you are physically unable to care for yourself, who would you call on to help you?  Lives w wife, daughter from [place] is staying w family for a time in order to support patient's care.  Both wife and daughter are being instructed on in home trach care today in preparation for taking patient home w them at discharge.   - Support system:  What does your support system look like?  Who would you call on if you needed some kind of practical help?  What if you needed someone to talk to for emotional support?  No one other than daughters - one lives "down the street" and the other lives in [place] but is here for a while.  No one else is available for any kind of emotional or practical support.   - Finances:  Are you concerned about finances.  Considering returning to work?  If not, applying for disability?  Was not working at diagnosis, was taken out of work at start of [place] pandemic, then was laid off.  Approved for Social Security disability. Wife has purchased [employer] policy which currently provides coverage - as patient receives SSDI, he will be offered Medicare after wait period of 24 months from date of disability determination. Wife is trying to get him on Medicaid - was told by [place] [name] [phone]) that needs additional paperwork submitted as well as copies of medical bills.  Has "junk vehicles" in yard, has turned tags in to [place], cannot afford to have these towed away.  Spoke  w DSS Worker - she will request copies of bills from [facility] on behalf of patient.  Requests that wife concentrate on dealing w cars in yard.  Per DSS, patient and spouse can only have one car between them.  Maximum value of all assets must be under $3000.  Wife states that she has enough in bank account to cover mortgage and utility bills.  No concerns re access to food.    What is your understanding of where you are with your cancer? Its cause?  Your treatment plan and what happens next?  New diagnosis of laryngeal cancer, will discharge today w trach and return home.  Will begin infusion chemotherapy next week.  Unable to speak directly w patient as he is currently hospitalized, will follow up in two weeks w wife/patient to assess progress.    CSW Summary:  Patient and family psychosocial functioning including strengths, limitations, and coping skills:  New diagnosis of laryngeal cancer, will start chemotherapy next week.  Lives w wife.  Both on disability.  Wife will be caring for patient in the home.  Limited support from two daughters, no other support identified.  Financially strained.  Wife learning to care for patient at home - hopes to bring him home today.  Wife concerned about 3 steps required to get into home.  Has also been told he would benefit from walker, they do not have DME.    Identifications  of barriers to care:  Limited support, financial strain, unclear what services are covered under terms of current insurance policy.  Has barriers (excess vehicles and over income) to obtaining Medicaid.    Availability of community resources:  DSS worker [name] working w patients wife on [place] application.  [place] for financial and social support.  Voc Rehab referral for assessment for environmental modifications to increase home's handicap accessibility.  Wife consents to these referrals.  Will also ask [place] to contact wife to help  her negotiate Medicaid application process.    Clinical Social Worker follow up needed: Yes.    Contact in 2 weeks to assess progress, appt made.    Edwyna Shell, LCSW Clinical Social Worker Phone:  [phone] Cell:  [phone]

## 2019-02-25 NOTE — Progress Notes (Signed)
Met with wife, daughter and patient about trach education this AM. [name] over all the basics. Using an obturator to replace trach, what to do if it comes out, trach cleaning, suctioning, securing airway, and when to call 911. Patient and family both stated they felt ok with all of previously mentioned tasks. Wife stated that home health care would hopefully be out sometime today with supplies, but I sent home a bag of things that I felt could be needed until then. I instructed them to contact us again with any questions before leaving.

## 2019-02-25 NOTE — Progress Notes (Signed)
Nutrition Follow-up  DOCUMENTATION CODES:   Not applicable  INTERVENTION:  Provide Ensure Enlive po TID, each supplement provides 350 kcal and 20 grams of protein.  Magic cup TID with meals, each supplement provides 290 kcal and 9 grams of protein cup  Recommend continuation of nutritional supplements at home.   Encourage adequate PO intake.   NUTRITION DIAGNOSIS:   Inadequate oral intake related to dysphagia as evidenced by NPO status; diet advanced; progressing  GOAL:   Patient will meet greater than or equal to 90% of their needs; progressing  MONITOR:   PO intake, Supplement acceptance, Skin, Weight trends, Labs, I & O's  REASON FOR ASSESSMENT:   Consult Other (Comment)  ASSESSMENT:   59 yo male presented for dental extraction but required emergent trach for upper airway obstruction. Pt with new diagnosis of squamous cell laryngeal carcinoma. 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 Pt reports dislike of hospital food thus refused meal tray. Family at bedside has brought in pureed food from home for pt to consume. Pt reports having a good appetite with no difficulties. Pt refused tube feeding initiation as he reports being able to PO by mouth. Plans to use PEG for enteral nutrition/medications when pt unable to meet nutritional needs by mouth. Pt does consume Boost nutritional shakes at home at least once daily. Recommended continuation of nutritional supplements at home to aid in caloric and protein needs and to increase protein shake consumption if po intake becomes inadequate. Pt and family understand information discussed. Plans for discharge home today.   NUTRITION - FOCUSED PHYSICAL EXAM:    Most Recent Value  Orbital Region  Unable to assess  Upper Arm Region  No depletion  Thoracic and Lumbar  Region  No depletion  Buccal Region  Unable to assess  Temple Region  Unable to assess  Clavicle Bone Region  No depletion  Clavicle and Acromion Bone Region  No depletion  Scapular Bone Region  Unable to assess  Dorsal Hand  Unable to assess  Patellar Region  Moderate depletion  Anterior Thigh Region  Moderate depletion  Posterior Calf Region  Moderate depletion  Edema (RD Assessment)  Mild  Hair  Reviewed  Eyes  Reviewed  Mouth  Reviewed  Skin  Reviewed  Nails  Reviewed        Labs and medications reviewed.   Diet Order:   Diet Order            DIET - DYS 1 Room service appropriate? Yes; Fluid consistency: Thin  Diet effective now        Diet - low sodium heart healthy        DIET - DYS 1              EDUCATION NEEDS:   Education needs have been addressed  Skin:  Skin Assessment: Skin Integrity Issues: Skin Integrity Issues:: Incisions Incisions: abdomen  Last BM:  2/20  Height:   Ht Readings from Last 1 Encounters:  02/18/19 5\' 8"  (1.727 m)    Weight:   Wt Readings from Last 1 Encounters:  02/24/19 82.1 kg    BMI:  Body mass index is 27.52 kg/m.  Estimated Nutritional Needs:   Kcal:  4270-6237  Protein:  115-130 grams  Fluid:  >/= 2 L  Corrin Parker, MS, RD, LDN RD  pager number/after hours weekend pager number on Amion.

## 2019-02-25 NOTE — TOC Transition Note (Addendum)
Transition of Care [facility]) - CM/SW Discharge Note   Patient Details  Name: ISON WICHMANN MRN: [id number] Date of Birth: Dec 27, 1960  Transition of Care [facility]) CM/SW Contact:  Carles Collet, RN Phone Number: 02/25/2019, 11:14 AM   Clinical Narrative:   Patient from home w wife.  Spoke to patient at bedside. He is agreeable to [facility] services. He does not have a preference for provider. Referral accepted by [facility]. Will DC with Gtube and PICC line. RN added to [facility] PT order for PICC line assessment- will be used/access/capped by outpatient oncology- and G tube maintenance and education. Per notes patient is getting adequate nutrition PO and is not using G tube for feeds. RN can assist with education and flushes if patient and wife need additional after DC as well as trach care.  Trach management per patient and wife.  Requested MD to order DME O2/ suction supplies as needed.   Patient will DC to [place].       Final next level of care: [place] Barriers to Discharge: No Barriers Identified   Patient Goals and CMS Choice Patient states their goals for this hospitalization and ongoing recovery are:: to go home CMS Medicare.gov Compare Post Acute Care list provided to:: Patient Choice offered to / list presented to : Patient  Discharge Placement                       Discharge Plan and Services                          HH Arranged: PT, RN [facility] Agency: [place] ([facility]) Date [place]: 02/25/19 Time [place]: [phone] Representative spoke with at [place]: [place] ([place]) Interventions     Readmission Risk Interventions No flowsheet data found.

## 2019-02-25 NOTE — Progress Notes (Addendum)
1 Day Post-Op  Subjective: CC: Frustrated he hasn't been able to eat after surgery. Denies any pain, n/v.   Objective: Vital signs in last 24 hours: Temp:  [97.7 F (36.5 C)-98.8 F (37.1 C)] 98.8 F (37.1 C) (02/23 0025) Pulse Rate:  [66-97] 70 (02/23 0025) Resp:  [12-21] 16 (02/22 2059) BP: (121-180)/(75-99) 168/93 (02/23 0025) SpO2:  [95 %-100 %] 99 % (02/22 2059) FiO2 (%):  [28 %] 28 % (02/22 2059) Last BM Date: 02/22/19  Intake/Output from previous day: 02/22 0701 - 02/23 0700 In: 1220 [P.O.:360; I.V.:800] Out: 1410 [Urine:1400; Blood:10] Intake/Output this shift: No intake/output data recorded.  PE: Gen:  Alert, NAD, pleasant Neck: Trach in place.  Pulm: normal rate and effort  Abd: Soft, ND, appropriately tender, g-tube in place and clamped. Flushed and pulls back easily. +BS Ext:  No LE edema  Psych: A&Ox3  Skin: no rashes noted, warm and dry  Lab Results:  Recent Labs    02/24/19 0111 02/25/19 0235  WBC 7.7 7.5  HGB 9.3* 9.4*  HCT 27.2* 27.9*  PLT 252 274   BMET Recent Labs    02/24/19 0111 02/25/19 0235  NA 134* 134*  K 3.7 4.0  CL 98 97*  CO2 27 27  GLUCOSE 110* 117*  BUN 11 8  CREATININE 0.83 0.86  CALCIUM 8.2* 8.6*   PT/INR No results for input(s): LABPROT, INR in the last 72 hours. CMP     Component Value Date/Time   NA 134 (L) 02/25/2019 0235   K 4.0 02/25/2019 0235   CL 97 (L) 02/25/2019 0235   CO2 27 02/25/2019 0235   GLUCOSE 117 (H) 02/25/2019 0235   BUN 8 02/25/2019 0235   BUN 13 12/24/2018 0918   CREATININE 0.86 02/25/2019 0235   CREATININE 1.23 10/28/2012 1558   CALCIUM 8.6 (L) 02/25/2019 0235   PROT 6.9 02/17/2019 1419   ALBUMIN 3.9 02/17/2019 1419   AST 20 02/17/2019 1419   ALT 15 02/17/2019 1419   ALKPHOS 61 02/17/2019 1419   BILITOT 0.9 02/17/2019 1419   GFRNONAA >60 02/25/2019 0235   GFRAA >60 02/25/2019 0235   Lipase  No results found for: LIPASE     Studies/Results: DG Foot 2 Views  Left  Result Date: 02/23/2019 CLINICAL DATA:  Right first toe pain one week. EXAM: LEFT FOOT - 2 VIEW COMPARISON:  None. FINDINGS: No evidence of acute fracture or dislocation. No significant degenerative changes or erosive change. Small inferior calcaneal spur is present. No focal soft tissue abnormality. IMPRESSION: No acute findings. Electronically Signed   By: Marin Olp M.D.   On: 02/23/2019 11:50    Anti-infectives: Anti-infectives (From admission, onward)   Start     Dose/Rate Route Frequency Ordered Stop   02/24/19 0600  ceFAZolin (ANCEF) IVPB 2g/100 mL premix     2 g 200 mL/hr over 30 Minutes Intravenous  Once 02/23/19 0653 02/24/19 0640   02/19/19 1200  ceFAZolin (ANCEF) IVPB 2g/100 mL premix  Status:  Discontinued     2 g 200 mL/hr over 30 Minutes Intravenous Every 8 hours 02/19/19 1045 02/21/19 1638   02/19/19 1045  ceFAZolin (ANCEF) IVPB 2g/100 mL premix  Status:  Discontinued     2 g 200 mL/hr over 30 Minutes Intravenous Every 8 hours 02/19/19 1032 02/19/19 1045   02/18/19 0615  ceFAZolin (ANCEF) IVPB 2g/100 mL premix     2 g 200 mL/hr over 30 Minutes Intravenous  Once 02/18/19 0610 02/18/19 0753  02/18/19 0613  ceFAZolin (ANCEF) 2-4 GM/100ML-% IVPB    Note to Pharmacy: Maryjean Ka   : cabinet override      02/18/19 2787 02/18/19 0801       Assessment/Plan CAD - Takes Plavix at Home  HTN CHF  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 Redmond Pulling - 02/24/2019 - POD #1 - Okay for diet and tube feeds from our standpoint. Speech following and previously passed for DYS 1 diet  - G-tube teaching - We will continue to follow  FEN - DYS 1. Okay to start tube feeds whenever needed VTE - SCDs, okay for chemical prophylaxis from our standpoint.  ID - Ancef periop  Plan: G-tube teaching. Okay for diet from our  standpoint. Will need to flush g-tube at least once per day if not using g-tube at home. Spoke with hospitalist. They plan to discharge today.    LOS: 7 days    Jillyn Ledger , [facility] 02/25/2019, 8:08 AM Please see Amion for pager number during day hours 7:00am-4:30pm

## 2019-02-25 NOTE — Progress Notes (Signed)
Subjective: Resting in bed. No issues overnight. Ready to go home.  Objective: Vital signs in last 24 hours: Temp:  [97.7 F (36.5 C)-98.8 F (37.1 C)] 98.8 F (37.1 C) (02/23 0025) Pulse Rate:  [66-97] 70 (02/23 0025) Resp:  [12-21] 16 (02/22 2059) BP: (121-180)/(75-99) 168/93 (02/23 0025) SpO2:  [95 %-100 %] 99 % (02/22 2059) FiO2 (%):  [28 %] 28 % (02/22 2059)  Physical Exam: General:Alert and responsive. NAD. Head: Normocephalic, no evidence injury, no tenderness, facial buttresses intact without stepoff.  Eyes: PERRL, EOMI. No scleral icterus, conjunctivae clear.  Ears: Normal auricles and EACs. Nose: Normal skin and external support. Anterior rhinoscopy reveals healthy pink mucosa over the septum and turbinates.  Oral cavity:s/p dental extractions.Facial edemaresolved. Neck: Trach in place. Breathing well via trach.No bleeding.   Recent Labs    02/24/19 0111 02/25/19 0235  WBC 7.7 7.5  HGB 9.3* 9.4*  HCT 27.2* 27.9*  PLT 252 274   Recent Labs    02/24/19 0111 02/25/19 0235  NA 134* 134*  K 3.7 4.0  CL 98 97*  CO2 27 27  GLUCOSE 110* 117*  BUN 11 8  CREATININE 0.83 0.86  CALCIUM 8.2* 8.6*    Medications:  I have reviewed the patient's current medications. Scheduled: . amLODipine  5 mg Oral Daily  . atorvastatin  80 mg Oral QPM  . chlorhexidine  15 mL Mouth Rinse BID  . Chlorhexidine Gluconate Cloth  6 each Topical Q0600  .  HYDROmorphone (DILAUDID) injection  0.5 mg Intravenous Once  . lipase/protease/amylase)  20,880 Units Per Tube Once   And  . sodium bicarbonate  650 mg Per Tube Once  . lisinopril  20 mg Oral Daily  . naproxen  500 mg Oral BID WC  . pantoprazole  40 mg Oral BID   Continuous: . lactated ringers    . lactated ringers 75 mL/hr at 02/24/19 1957  . sodium chloride irrigation     FXJ:OITGPQDIYMEBR **OR** acetaminophen, alum & mag hydroxide-simeth, heparin lock flush, hydrALAZINE, lidocaine, morphine  injection  Assessment/Plan: POD #7s/p trach and dental extractions. - On trach collar. Lurline Idol care teaching. Trach changed to an uncuffed #6 [place]. - ChemoXRT per medical and radiation oncology. - G-tube placement done yesterday. - Pt may follow up with me as an outpatient after he completes his chemoXRT treatment.   LOS: 7 days   Maebelle Sulton W Elsi Stelzer 02/25/2019, 6:16 AM

## 2019-02-26 ENCOUNTER — Telehealth: Payer: Self-pay | Admitting: *Deleted

## 2019-02-26 ENCOUNTER — Telehealth (HOSPITAL_COMMUNITY): Payer: Self-pay | Admitting: Physical Therapy

## 2019-02-26 ENCOUNTER — Ambulatory Visit (HOSPITAL_COMMUNITY): Payer: 59 | Admitting: Physical Therapy

## 2019-02-26 NOTE — Telephone Encounter (Signed)
per provider to cancel today's appt due to the pt is in the hospital appt will need to be rescheduled.

## 2019-02-26 NOTE — Progress Notes (Signed)
Has armband been applied?  Yes  Does patient have an allergy to IV contrast dye?: No   Has patient ever received premedication for IV contrast dye?: N/A  Does patient take metformin?: No  If patient does take metformin when was the last dose: N/A  Date of lab work: 02/25/19 BUN: 8 CR: 0.86 EGFR: > 60  IV site: Right PICC- flushed with saline, good blood return  Has IV site been added to flowsheet?  Yes

## 2019-02-26 NOTE — Telephone Encounter (Signed)
Oncology Nurse Navigator Documentation  Spoke with Mr. Riano' wife.  Informed her of CT SIM appt for this Friday beginning with 2:30 port access followed by 3:00 SIM.  She agreed to 1:45 [place] arrival for gastrostomy tube/port education, voiced understanding I will meet them in lobby.  [name], RN, BSN Head & Neck Oncology Nurse [place] at [place] [phone]

## 2019-02-26 NOTE — Discharge Summary (Addendum)
Physician Discharge Summary  NTHONY [name] ENI:[id number] DOB: 1960-08-30 DOA: 02/18/2019  PCP: [name], MD  Admit date: 02/18/2019 Discharge date: 02/26/2019  Admitted From: home Disposition:  home  Recommendations for Outpatient Follow-up:  1. Follow up with PCP in 1 week 2. Follow up with Dr. Delton Coombes, oncology, on 3/2 as scheduled  3. Follow up with Dr. Enrique Sack, dentist, for suture removal  4. Follow up with Dr. Benjamine Mola, ENT, after he completed chemoXRT treatment   Discharge Condition: Stable CODE STATUS: Full  Diet recommendation: Dysphagia 1 diet. Can use G tube for enteral nutrition/meds when no longer able to meet nutritional needs by mouth.   Brief/Interim Summary: [name] is a 59 y.o. 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 However, during anesthesia induction, he was noted to have very difficult airway requiring multiple attempts before an ETT was placed.  Due to significant bleeding and trauma there was concern for airway protection and ENT was consulted who decided to proceed with cuffed tracheostomy after his dental procedure and ICU for observation.  He was transferred out of the ICU in [facility] assumed care on 2/18.  He has since evaluated by SLP.  IR commended general surgery consult for G tube insertion due to abnormal gastric anatomy and IR to place Port-A-Cath in anticipation of starting chemotherapy.  Due to leukocytosis he was started on Ancef per dental and ENT team which improved and this was discontinued 2/19. He underwent IR guided RUE dual-lumen power PICC instead of Port-A-Cath for chemotherapy 2/19.  General surgery consulted for G-tube placement and was placed 2/22.  Trach changed to uncuffed #6 Shiley by ENT.  Discharge Diagnoses:  Active Problems:   Malignant neoplasm of supraglottis (HCC)   Hx of tracheostomy   Head and neck cancer ([place])   1. Newly diagnosed supraglottic posterior laryngeal squamous cell carcinoma complicated by traumatic intubation during dental procedure, s/p tracheostomy placement a. 2/19 PICC placed by IR for anticipation of chemotherapy b. 2/22 G-tube placement in anticipation of possible change to NPO in the future - can have PO intake but must flush tube daily. Education provided prior to discharge  c. 2/23: trach changed to uncuffed #6 Shiley by ENT.  d. Completed Ancef for concern of leukocytosis and possible infection during procedure e. Oncology follow up scheduled for 3/2 2. Left Great Toe Pain, concern for gout vs. Pseudogout a. Xray unremarkable b. ESR and CRP elevated, uric acid low c. Discussed with Ortho, difficult to obtain joint aspirate from left great toe. Recommended to treat empirically for gout d. Near resolution with naproxen and PPI but having persistent GERD so NSAID discontinued 3. GERD a. PPI  4. Hypertension a. Continue lisinopril and amlodipine and metoprolol  5. Low back pain a. Heating pad PRN  6. Depression stable a. Does not wish to start SSRI 7. Compensated HFpEF 8. CAD a. Statin  b. Can restart antiplatelets at discharge   Discharge Instructions  Discharge Instructions    Call MD for:  difficulty breathing, headache or visual disturbances   Complete by: As directed    Call MD for:  extreme fatigue   Complete by: As directed    Call MD for:  persistant dizziness or light-headedness   Complete by: As directed  Call MD for:  persistant nausea and vomiting   Complete by: As directed    Call MD for:  redness, tenderness, or signs of infection (pain, swelling, redness, odor or green/yellow discharge around incision site)   Complete by: As directed    Call MD for:  severe  uncontrolled pain   Complete by: As directed    Call MD for:  temperature >100.4   Complete by: As directed    DIET - DYS 1   Complete by: As directed    Fluid consistency: Thin   Diet - low sodium heart healthy   Complete by: As directed    Discharge instructions   Complete by: As directed    You were seen and examined in the hospital for tooth extraction, tracheostomy and G tube insertion and cared for by a hospitalist, ENT, Dentist and surgeon  Upon Discharge:  - Take Naproxen twice daily with meals for the next three days for your left great toe pain, continue taking your omeprazole on this medication - flush the G tube daily as advised - follow up with your oncologist as scheduled  Make an appointment with your primary care physician within 7 days Get lab work prior to your follow up appointment with your PCP Bring all home medications to your appointment to review Request that your primary physician go over all hospital tests and procedures/radiological results at the follow up.   Please get all hospital records sent to your physician by signing a hospital release before you go home.   Read the complete instructions along with all the possible side effects for all the medicines you take and that have been prescribed to you. Take any new medicines after you have completely understood and accept all the possible adverse reactions/side effects.   If you have any questions about your discharge medications or the care you received while you were in the hospital, you can call the unit and asked to speak with the hospitalist on call. Once you are discharged, your primary care physician will handle any further medical issues. Please note that NO REFILLS for any discharge medications will be authorized, as it is imperative that you return to your primary care physician (or establish a relationship with a primary care physician if you do not have one) for your aftercare needs so that they can  reassess your need for medications and monitor your lab values.   Do not drive, operate heavy machinery, perform activities at heights, swimming or participation in water activities or provide baby sitting services if your were admitted for loss of consciousness/seizures or if you are on sedating medications including, but not limited to benzodiazepines, sleep medications, narcotic pain medications, etc., until you have been cleared to do so by a medical doctor.   Do not take more than prescribed medications.   Wear a seat belt while driving.  If you have smoked or chewed Tobacco in the last 2 years please stop smoking; also stop any regular Alcohol and/or any Recreational drug use including marijuana.  If you experience worsening of your admission symptoms or develop shortness of breath, chest pain, suicidal or homicidal thoughts or experience a life threatening emergency, you must seek medical attention immediately by calling 911 or calling your PCP immediately.   Discharge instructions   Complete by: As directed    You were cared for by a hospitalist during your hospital stay. If you have any questions about your discharge medications or the care you received while  you were in the hospital after you are discharged, you can call the unit and ask to speak with the hospitalist on call if the hospitalist that took care of you is not available. Once you are discharged, your primary care physician will handle any further medical issues. Please note that NO REFILLS for any discharge medications will be authorized once you are discharged, as it is imperative that you return to your primary care physician (or establish a relationship with a primary care physician if you do not have one) for your aftercare needs so that they can reassess your need for medications and monitor your lab values.   Increase activity slowly   Complete by: As directed    Increase activity slowly   Complete by: As directed     Increase activity slowly   Complete by: As directed      Allergies as of 02/26/2019      Reactions   Prednisone Other (See Comments)   Chest pain      Medication List    STOP taking these medications   penicillin v potassium 500 MG tablet Commonly known as: VEETID     TAKE these medications   amLODipine 5 MG tablet Commonly known as: NORVASC Take 1 tablet (5 mg total) by mouth daily.   [place]yle Suction Catheter 14FR Misc 1 Device by Does not apply route daily.   aspirin EC 81 MG tablet Take 81 mg by mouth daily.   atorvastatin 80 MG tablet Commonly known as: LIPITOR Take 1 tablet (80 mg total) by mouth every evening.   CISPLATIN IV Inject into the vein. Every 21 days in conjunction with radiation therapy beginning 03/04/2019 Start taking on: March 04, 2019   clopidogrel 75 MG tablet Commonly known as: PLAVIX Take 1 tablet (75 mg total) by mouth daily.   HYDROcodone-acetaminophen 10-325 MG tablet Commonly known as: NORCO Take 1 tablet by mouth every 4 (four) hours as needed.   lisinopril 20 MG tablet Commonly known as: ZESTRIL Take 1 tablet (20 mg total) by mouth daily. What changed: when to take this   metoprolol tartrate 25 MG tablet Commonly known as: LOPRESSOR TAKE 1/2 TABLET BY MOUTH TWICE DAILY   nitroGLYCERIN 0.4 MG SL tablet Commonly known as: NITROSTAT Place 1 tablet (0.4 mg total) under the tongue every 5 (five) minutes as needed for chest pain (CP or SOB).   omeprazole 20 MG capsule Commonly known as: PRILOSEC Take 20 mg by mouth daily.   sodium chloride irrigation 0.9 % irrigation Irrigate with 200 mLs as directed continuous.   Tracheostomy Care Kit 1 kit by Does not apply route daily.   traMADol 50 MG tablet Commonly known as: ULTRAM Take 50-100 mg by mouth 4 (four) times daily as needed (pain.).            Durable Medical Equipment  (From admission, onward)         Start     Ordered   02/26/19 1005  For home use only DME Trach  supplies  Once    Question Answer Comment  Trach Type Shiley   Size 6   Cuffed or Uncuffed Uncuffed   Fenestrated No   Does patient need speaking valve? Yes   Trach Humidification No   Suction Needed? No   Manual Resuscitator Needed? Yes   Emergency Backup Trach size (one size smaller than trach in throat) 5      02/26/19 1004         Follow-up  Information    [name], [name]. Schedule an appointment as soon as possible for a visit on 03/03/2019.   Specialty: Dentistry Why: For suture removal Contact information: [place] [address] [phone]        [facility] Follow up.   Specialty: Home Health Services Why: for home health PT and RN. they will call you in a few days to set up your first home appointment       [name], MD. Schedule an appointment as soon as possible for a visit in 1 week(s).   Specialty: Internal Medicine Contact information: [address] [place] [address] [phone]        [name], MD. Go on 03/04/2019.   Specialty: Hematology Contact information: [place] [address] [phone]        [name], MD. Call.   Specialty: Otolaryngology Contact information: [address] [place]  [address] [phone]          Allergies  Allergen Reactions  . Prednisone Other (See Comments)    Chest pain      Procedures/Studies: CT ABDOMEN WO CONTRAST  Result Date: 02/20/2019 CLINICAL DATA:  Laryngeal squamous carcinoma and evaluation for gastrostomy tube placement prior to starting therapy. EXAM: CT ABDOMEN WITHOUT CONTRAST TECHNIQUE: Multidetector CT imaging of the abdomen was performed following the standard protocol without IV contrast. COMPARISON:  PET scan on 01/27/2019 FINDINGS: Lower chest: Atelectasis and scarring at both lung bases. Hepatobiliary: No focal liver abnormality is seen. No gallstones, gallbladder wall  thickening, or biliary dilatation. Pancreas: Unremarkable. No pancreatic ductal dilatation or surrounding inflammatory changes. Spleen: Normal in size without focal abnormality. Adrenals/Urinary Tract: Adrenal glands are unremarkable. Kidneys are normal, without renal calculi, focal lesion, or hydronephrosis. Stomach/Bowel: No hiatal hernia. The stomach has a horizontal lie, is fairly deeply located and consistently is situated directly behind the transverse colon on both the current CT as well as the prior PET scan. This makes the patient a poor candidate for percutaneous gastrostomy even after gastric insufflation. There are some mildly dilated anterior small bowel loops inferior to the transverse colon suggestive of a component of mild ileus. Other small bowel loops are not dilated. No evidence of free air or abnormal fluid collection in the abdomen. Vascular/Lymphatic: Aortic atherosclerosis with calcified plaque present. The infrarenal aorta just below the IMA origin shows slight bulge measuring up to 2.6 cm in diameter. This is not overtly aneurysmal. No enlarged abdominal or pelvic lymph nodes. Other:  No ascites or body wall edema. No abdominal wall hernia. Musculoskeletal: No acute or significant osseous findings. IMPRESSION: 1. The stomach has a horizontal lie directly behind the transverse colon on both the current CT and prior PET scan. This makes the patient a poor candidate for percutaneous gastrostomy even after gastric insufflation. Recommend surgical consultation for possible surgical gastrostomy versus jejunostomy. 2. There are some mildly dilated anterior small bowel loops inferior to the transverse colon suggestive of a component of mild ileus. No evidence of bowel obstruction, free air or abnormal fluid collection in the abdomen. 3. Aortic atherosclerosis with slight bulge of the infrarenal aorta measuring up to 2.6 cm in diameter. This is not overtly aneurysmal. Recommend follow-up abdominal  ultrasound in 3 years. This recommendation follows ACR consensus guidelines: White Paper of the ACR Incidental Findings Committee II on Vascular Findings. J Am Coll Radiol 2013; 10: Aortic Atherosclerosis (ICD10-I70.0). Electronically Signed   By: Aletta Edouard  M.D.   On: 02/20/2019 08:43   MR LUMBAR SPINE WO CONTRAST  Result Date: 02/14/2019 CLINICAL DATA:  Low back pain radiating to both legs. EXAM: MRI LUMBAR SPINE WITHOUT CONTRAST TECHNIQUE: Multiplanar, multisequence MR imaging of the lumbar spine was performed. No intravenous contrast was administered. COMPARISON:  MRI of the lumbar spine November 22, 2016 - axial images only FINDINGS: Segmentation:  Standard. Alignment:  Minimal retrolisthesis of L5 over S1. Vertebrae: Degenerative endplate changes at M7-E6. No evidence of discitis, fracture nor suspicious bone lesion. Conus medullaris and cauda equina: Conus extends to the L1 level. Conus and cauda equina appear normal. Paraspinal and other soft tissues: Mild bilateral perirenal fat stranding. Disc levels: T12-L1: Shallow disc bulge without spinal canal or neural foraminal stenosis. L1-2: No spinal canal or neural foraminal stenosis. L2-3: No spinal canal or neural foraminal stenosis. L3-4: Disc bulge and facet degenerative changes causing minimal indentation on the thecal sac. No significant neural foraminal stenosis. L4-5: Disc bulge and moderate facet degenerative changes causing minimal indentation on the thecal sac. There is no significant neural foraminal stenosis. L5-S1: Decrease of disc space height, disc bulge and moderate facet degenerative changes resulting in moderate bilateral neural foraminal narrowing. No significant changes noted when compared to MRI performed in 2018. IMPRESSION: 1. No acute abnormality of the lumbar spine. 2. Mild degenerative changes, more pronounced at L5-S1 with moderate bilateral neural foraminal narrowing. Electronically Signed   By: Pedro Earls  M.D.   On: 02/14/2019 15:07   NM PET Image Initial (PI) Skull Base To Thigh  Result Date: 01/27/2019 CLINICAL DATA:  Initial treatment strategy for head neck cancer. EXAM: NUCLEAR MEDICINE PET SKULL BASE TO THIGH TECHNIQUE: 8.9 mCi F-18 FDG was injected intravenously. Full-ring PET imaging was performed from the skull base to thigh after the radiotracer. CT data was obtained and used for attenuation correction and anatomic localization. Fasting blood glucose: 111 mg/dl COMPARISON:  None. FINDINGS: Mediastinal blood pool activity: SUV max  3 Liver activity: SUV max NA NECK: Abnormal hypermetabolism identified in the left and posterior laryngeal region with SUV max = 21.9 consistent with the patient's known neoplasm. Hypermetabolic metastatic lymphadenopathy is identified in the neck bilaterally at level II and III positions. Index left-sided 11 mm short axis level II node (28/4) demonstrates SUV max = 13.7. Index right-sided 11 mm short axis level III lymph node demonstrates SUV max = 18.4. Incidental CT findings: none CHEST: No hypermetabolic mediastinal or hilar nodes. No suspicious pulmonary nodules on the CT scan. Incidental CT findings: Coronary artery calcification is evident. Atherosclerotic calcification is noted in the wall of the thoracic aorta. ABDOMEN/PELVIS: No abnormal hypermetabolic activity within the liver, pancreas, adrenal glands, or spleen. No hypermetabolic lymph nodes in the abdomen or pelvis. Incidental CT findings: There is abdominal aortic atherosclerosis without aneurysm. SKELETON: No focal hypermetabolic activity to suggest skeletal metastasis. Incidental CT findings: none IMPRESSION: 1. Hypermetabolic laryngeal lesion consistent with the patient's known primary malignancy. 2. Hypermetabolic cervical metastatic disease bilaterally. 3. No evidence for hypermetabolic metastases in the chest, abdomen, or pelvis. Electronically Signed   By: Misty Stanley M.D.   On: 01/27/2019 14:07   DG  Chest Port 1 View  Result Date: 02/18/2019 CLINICAL DATA:  Check tracheostomy tube EXAM: PORTABLE CHEST 1 VIEW COMPARISON:  07/31/2018 FINDINGS: Tracheostomy tube is now seen in satisfactory position. Cardiac shadow is within normal limits. The lungs are clear. No bony abnormality is seen. IMPRESSION: Tracheostomy tube in satisfactory position. Electronically Signed  By: Inez Catalina M.D.   On: 02/18/2019 13:46   DG Foot 2 Views Left  Result Date: 02/23/2019 CLINICAL DATA:  Right first toe pain one week. EXAM: LEFT FOOT - 2 VIEW COMPARISON:  None. FINDINGS: No evidence of acute fracture or dislocation. No significant degenerative changes or erosive change. Small inferior calcaneal spur is present. No focal soft tissue abnormality. IMPRESSION: No acute findings. Electronically Signed   By: Marin Olp M.D.   On: 02/23/2019 11:50   DG Swallowing Func-Speech Pathology  Result Date: 02/21/2019 Objective Swallowing Evaluation: Type of Study: MBS-Modified Barium Swallow Study  Patient Details Name: AREL TIPPEN MRN: [id number] Date of Birth: Jan 16, 1960 Today's Date: 02/21/2019 Time: SLP Start Time (ACUTE ONLY): 1415 -SLP Stop Time (ACUTE ONLY): 1435 SLP Time Calculation (min) (ACUTE ONLY): 20 min Past Medical History: Past Medical History: Diagnosis Date . Anxiety  . Arthritis   back . Bradycardia   a. During 11/2012 adm: lopressor decreased. . Cancer of larynx ([place])  . Carotid artery stenosis  . 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 Coronary artery disease   a. Diag cath 10/2012 for CP/abnormal nuc -> planned PCI s/p LAD atherectomy/DES placement 11/05/12. [place] Kitchen GERD (gastroesophageal reflux disease)  . History of hiatal hernia  . Hypercholesteremia  . Hypertension  . Stroke ([place]) 07/31/2018  bilateral vertebrobasilar occlusion; "left side weaker thanit was before." Past Surgical History: Past Surgical History: Procedure Laterality Date  . CARDIAC CATHETERIZATION  10/29/2012 . CORONARY ANGIOPLASTY WITH STENT PLACEMENT  11/05/2012 . DIRECT LARYNGOSCOPY N/A 01/02/2019  Procedure: MICRO DIRECT LARYNGOSCOPY BIOPSY OF LARYNGEAL MASS;  Surgeon: [name], MD;  Location: [facility];  Service: ENT;  Laterality: N/A; . HEMORRHOID SURGERY  ~ 2010 . INSERTION OF MESH N/A 06/02/2015  Procedure: INSERTION OF MESH;  Surgeon: [name], MD;  Location: [facility];  Service: General;  Laterality: N/A; . IR ANGIO INTRA EXTRACRAN SEL COM CAROTID INNOMINATE BILAT MOD SED  08/02/2018 . IR ANGIO VERTEBRAL SEL VERTEBRAL BILAT MOD SED  08/02/2018 . LEFT HEART CATHETERIZATION WITH CORONARY ANGIOGRAM N/A 10/30/2012  Procedure: LEFT HEART CATHETERIZATION WITH CORONARY ANGIOGRAM;  Surgeon: [name], MD;  Location: [facility] CATH LAB;  Service: Cardiovascular;  Laterality: N/A; . MULTIPLE EXTRACTIONS WITH ALVEOLOPLASTY N/A 02/18/2019  Procedure: Extraction of tooth #'s 5-7, 10,11,14,20-29, 31 and 32 with alveoloplasty and maxiillary left buccal exostoses reductions;  Surgeon: [name], [name];  Location: [facility];  Service: Oral Surgery;  Laterality: N/A; . PERCUTANEOUS CORONARY ROTOBLATOR INTERVENTION (PCI-R) N/A 11/05/2012  Procedure: PERCUTANEOUS CORONARY ROTOBLATOR INTERVENTION (PCI-R);  Surgeon: [name], MD;  Location: [facility] CATH LAB;  Service: Cardiovascular;  Laterality: N/A; . TRACHEOSTOMY TUBE PLACEMENT N/A 02/18/2019  Procedure: Tracheostomy;  Surgeon: [name], MD;  Location: [facility];  Service: ENT;  Laterality: N/A; . 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 Hx of Anxiety,  During induction airway was quite difficult  with friable tissue requiring several attempts before airway cannulated.  Dental extraction was completed.  It was decided to proceed with tracheostomy for airway protection given concern for airway obstruction. Had outpatient BSE on 02/05/19,  recommended thin liquids and regular diet.  Subjective: alert, cooperative Assessment / Plan / Recommendation CHL IP CLINICAL IMPRESSIONS 02/21/2019 Clinical Impression Patient presents with a mild pharyngeal phase dysphagia characterized by mildly decreased appearing hyolaryngeal elevation and excursion,  which in combination of presence of diagnosed mass,  is resulting in mild post swallow vallecular and pyriform sinus residuals, which clear with second swallow. Patient able to protect airway with no penetration or aspiration observed. Declined soft solids due to missing dentition and pain post dental extractions.  As pain subsides, suspect that he will be able to swallow advanced solids safely.  Declined pill. Will initiate a po diet and f/u at bedside. Note that PMV in place for evaluation with patient tolerating well.  SLP Visit Diagnosis Dysphagia, pharyngeal phase (R13.13) Attention and concentration deficit following -- Frontal lobe and executive function deficit following -- Impact on safety and function Mild aspiration risk   CHL IP TREATMENT RECOMMENDATION 02/21/2019 Treatment Recommendations Therapy as outlined in treatment plan below   Prognosis 02/21/2019 Prognosis for Safe Diet Advancement Good Barriers to Reach Goals -- Barriers/Prognosis Comment -- CHL IP DIET RECOMMENDATION 02/21/2019 SLP Diet Recommendations Dysphagia 1 (Puree) solids;Thin liquid Liquid Administration via Cup;Straw Medication Administration Crushed with puree Compensations Slow rate;Small sips/bites;Multiple dry swallows after each bite/sip Postural Changes Seated upright at 90 degrees   CHL IP OTHER RECOMMENDATIONS 02/21/2019 Recommended Consults -- Oral Care Recommendations Oral care BID Other  Recommendations Place PMSV during PO intake   CHL IP FOLLOW UP RECOMMENDATIONS 02/21/2019 Follow up Recommendations Outpatient SLP   CHL IP FREQUENCY AND DURATION 02/21/2019 Speech Therapy Frequency (ACUTE ONLY) min 2x/week Treatment Duration 2 weeks      CHL IP ORAL PHASE 02/21/2019 Oral Phase WFL Oral - Pudding Teaspoon -- Oral - Pudding Cup -- Oral - Honey Teaspoon -- Oral - Honey Cup -- Oral - Nectar Teaspoon -- Oral - Nectar Cup -- Oral - Nectar Straw -- Oral - Thin Teaspoon -- Oral - Thin Cup -- Oral - Thin Straw -- Oral - Puree -- Oral - Mech Soft -- Oral - Regular -- Oral - Multi-Consistency -- Oral - Pill -- Oral Phase - Comment --  CHL IP PHARYNGEAL PHASE 02/21/2019 Pharyngeal Phase Impaired Pharyngeal- Pudding Teaspoon -- Pharyngeal -- Pharyngeal- Pudding Cup -- Pharyngeal -- Pharyngeal- Honey Teaspoon -- Pharyngeal -- Pharyngeal- Honey Cup -- Pharyngeal -- Pharyngeal- Nectar Teaspoon -- Pharyngeal -- Pharyngeal- Nectar Cup -- Pharyngeal -- Pharyngeal- Nectar Straw -- Pharyngeal -- Pharyngeal- Thin Teaspoon -- Pharyngeal -- Pharyngeal- Thin Cup Pharyngeal residue - valleculae;Pharyngeal residue - pyriform;Reduced anterior laryngeal mobility;Reduced laryngeal elevation Pharyngeal -- Pharyngeal- Thin Straw Pharyngeal residue - valleculae;Pharyngeal residue - pyriform;Reduced anterior laryngeal mobility;Reduced laryngeal elevation Pharyngeal -- Pharyngeal- Puree Pharyngeal residue - valleculae;Pharyngeal residue - pyriform;Reduced anterior laryngeal mobility;Reduced laryngeal elevation Pharyngeal -- Pharyngeal- Mechanical Soft -- Pharyngeal -- Pharyngeal- Regular -- Pharyngeal -- Pharyngeal- Multi-consistency -- Pharyngeal -- Pharyngeal- Pill -- Pharyngeal -- Pharyngeal Comment --  Gabriel Rainwater MA, CCC-SLP [place] Meryl 02/21/2019, 3:56 PM              VAS US CAROTID  Result Date: 02/13/2019 Carotid Arterial Duplex Study Indications:       CVA. Risk Factors:      Hypertension, hyperlipidemia, coronary artery  disease. Other Factors:     Neck and throat cancer. Comparison Study:  IR angio vertebral 08-02-18, CT angio neck 08-01-18. Performing Technologist: Baldwin Crown ARDMS, RVT  Examination Guidelines: A complete evaluation includes B-mode imaging, spectral Doppler, color Doppler,  and power Doppler as needed of all accessible portions of each vessel. Bilateral testing is considered an integral part of a complete examination. Limited examinations for reoccurring indications may be performed as noted.  Right Carotid Findings: +----------+--------+--------+--------+-------------------------+--------------+           PSV cm/sEDV cm/sStenosisPlaque Description       Comments       +----------+--------+--------+--------+-------------------------+--------------+ CCA Prox  112     29                                                      +----------+--------+--------+--------+-------------------------+--------------+ CCA Distal48      14                                                      +----------+--------+--------+--------+-------------------------+--------------+ ICA Prox  195     66      40-59%  heterogenous and calcific               +----------+--------+--------+--------+-------------------------+--------------+ ICA Mid   130     54                                                      +----------+--------+--------+--------+-------------------------+--------------+ ICA Distal88      30                                                      +----------+--------+--------+--------+-------------------------+--------------+ ECA       104     19                                       low resistance +----------+--------+--------+--------+-------------------------+--------------+ +----------+--------+-------+----------------+-------------------+           PSV cm/sEDV cmsDescribe        Arm Pressure (mmHG) +----------+--------+-------+----------------+-------------------+  [id number]             Multiphasic, WNL                    +----------+--------+-------+----------------+-------------------+ +---------+--------+--+--------+-+---------+ VertebralPSV cm/s28EDV cm/s9Antegrade +---------+--------+--+--------+-+---------+  Left Carotid Findings: +----------+--------+--------+--------+------------------+---------------------+           PSV cm/sEDV cm/sStenosisPlaque DescriptionComments              +----------+--------+--------+--------+------------------+---------------------+ CCA Prox  102     27                                                      +----------+--------+--------+--------+------------------+---------------------+ CCA Distal100     20                                                      +----------+--------+--------+--------+------------------+---------------------+  ICA Prox  43      9       1-39%   heterogenous                            +----------+--------+--------+--------+------------------+---------------------+ ICA Mid                                             unable to visualize                                                       due to shadowing      +----------+--------+--------+--------+------------------+---------------------+ ICA Distal84      39                                                      +----------+--------+--------+--------+------------------+---------------------+ ECA       95      20              heterogenous      low resistance        +----------+--------+--------+--------+------------------+---------------------+ +----------+--------+--------+----------------+-------------------+           PSV cm/sEDV cm/sDescribe        Arm Pressure (mmHG) +----------+--------+--------+----------------+-------------------+ [id number]             Multiphasic, WNL                    +----------+--------+--------+----------------+-------------------+  +---------+--------+--+--------+--+---------+ VertebralPSV cm/s42EDV cm/s13Antegrade +---------+--------+--+--------+--+---------+   Summary: Right Carotid: Velocities in the right ICA are consistent with a 40-59%                stenosis. Left Carotid: Velocities in the left ICA are consistent with a 1-39% stenosis. Vertebrals:  Bilateral vertebral arteries demonstrate antegrade flow. Subclavians: Normal flow hemodynamics were seen in bilateral subclavian              arteries. *See table(s) above for measurements and observations.  Electronically signed by Harold Barban MD on 02/13/2019 at 7:27:20 AM.    Final    IR PICC PLACEMENT RIGHT >5 YRS INC IMG GUIDE  Result Date: 02/21/2019 INDICATION: Laryngeal cancer, access for initiation of chemotherapy EXAM: ULTRASOUND AND FLUOROSCOPIC GUIDED PICC LINE INSERTION MEDICATIONS: 1% lidocaine local CONTRAST:  None FLUOROSCOPY TIME:  Twelve seconds (1 mGy) COMPLICATIONS: None immediate. TECHNIQUE: The procedure, risks, benefits, and alternatives were explained to the patient and informed written consent was obtained. A timeout was performed prior to the initiation of the procedure. The right upper extremity was prepped with chlorhexidine in a sterile fashion, and a sterile drape was applied covering the operative field. Maximum barrier sterile technique with sterile gowns and gloves were used for the procedure. A timeout was performed prior to the initiation of the procedure. Local anesthesia was provided with 1% lidocaine. Under direct ultrasound guidance, the right basilic vein was accessed with a micropuncture kit after the overlying soft tissues were anesthetized with 1% lidocaine. An ultrasound image was saved for documentation purposes. A guidewire was advanced to the level  of the superior caval-atrial junction for measurement purposes and the PICC line was cut to length. A peel-away sheath was placed and a 37 cm, 5 [place], dual lumen was inserted to level of  the superior caval-atrial junction. A post procedure spot fluoroscopic was obtained. The catheter easily aspirated and flushed and was sutured in place. A dressing was placed. The patient tolerated the procedure well without immediate post procedural complication. FINDINGS: After catheter placement, the tip lies within the superior cavoatrial junction. The catheter aspirates and flushes normally and is ready for immediate use. IMPRESSION: Successful ultrasound and fluoroscopic guided placement of a right basilic vein approach, 37 cm, 5 French, dual lumen PICC with tip at the superior caval-atrial junction. The PICC line is ready for immediate use. Electronically Signed   By: Jerilynn Mages.  Shick M.D.   On: 02/21/2019 11:25       Discharge Exam: Vitals:   02/26/19 0753 02/26/19 0832  BP: (!) 164/88   Pulse:  75  Resp: 14 17  Temp: 98.3 F (36.8 C)   SpO2: 95% 97%     General: Pt is alert, awake, not in acute distress Cardiovascular: RRR, S1/S2 +, no edema Respiratory: CTA bilaterally, no wheezing, no rhonchi, no respiratory distress, no conversational dyspnea, trach capped  Abdominal: Soft, NT, ND, bowel sounds + Extremities: no edema, no cyanosis Psych: Normal mood and affect, stable judgement and insight     The results of significant diagnostics from this hospitalization (including imaging, microbiology, ancillary and laboratory) are listed below for reference.     Microbiology: Recent Results (from the past 240 hour(s))  SARS CORONAVIRUS 2 (TAT 6-24 HRS) Nasopharyngeal Nasopharyngeal Swab     Status: None   Collection Time: 02/17/19  2:48 PM   Specimen: Nasopharyngeal Swab  Result Value Ref Range Status   SARS Coronavirus 2 NEGATIVE NEGATIVE Final    Comment: (NOTE) SARS-CoV-2 target nucleic acids are NOT DETECTED. The SARS-CoV-2 RNA is generally detectable in upper and lower respiratory specimens during the acute phase of infection. Negative results do not preclude SARS-CoV-2  infection, do not rule out co-infections with other pathogens, and should not be used as the sole basis for treatment or other patient management decisions. Negative results must be combined with clinical observations, patient history, and epidemiological information. The expected result is Negative. Fact Sheet for Patients: SugarRoll.be Fact Sheet for Healthcare Providers: https://www.woods-mathews.com/ This test is not yet approved or cleared by the [place] FDA and  has been authorized for detection and/or diagnosis of SARS-CoV-2 by FDA under an Emergency Use Authorization (EUA). This EUA will remain  in effect (meaning this test can be used) for the duration of the COVID-19 declaration under Section 56 4(b)(1) of the Act, 21 U.S.C. section 360bbb-3(b)(1), unless the authorization is terminated or revoked sooner. Performed at [facility], [place] [address] [place], [place] [address]   MRSA PCR Screening     Status: None   Collection Time: 02/18/19  5:00 PM  Result Value Ref Range Status   MRSA by PCR NEGATIVE NEGATIVE Final    Comment:        The GeneXpert MRSA Assay (FDA approved for NASAL specimens only), is one component of a comprehensive MRSA colonization surveillance program. It is not intended to diagnose MRSA infection nor to guide or monitor treatment for MRSA infections. Performed at [facility], [place] [address], [place],  [address]   Culture, blood (Routine X 2) w Reflex to ID Panel  Status: None   Collection Time: 02/19/19 11:18 AM   Specimen: BLOOD  Result Value Ref Range Status   Specimen Description BLOOD RIGHT ANTECUBITAL  Final   Special Requests   Final    BOTTLES DRAWN AEROBIC AND ANAEROBIC Blood Culture results may not be optimal due to an excessive volume of blood received in culture bottles   Culture   Final    NO GROWTH 5 DAYS Performed at [facility], [place] [address]  [place], [place] [address]    Report Status 02/24/2019 FINAL  Final  Culture, blood (Routine X 2) w Reflex to ID Panel     Status: None   Collection Time: 02/19/19 11:18 AM   Specimen: BLOOD RIGHT ARM  Result Value Ref Range Status   Specimen Description BLOOD RIGHT ARM  Final   Special Requests   Final    BOTTLES DRAWN AEROBIC AND ANAEROBIC Blood Culture results may not be optimal due to an excessive volume of blood received in culture bottles   Culture   Final    NO GROWTH 5 DAYS Performed at [facility], [place] [address] [place], [place] [address]    Report Status 02/24/2019 FINAL  Final     Labs: BNP (last 3 results) No results for input(s): BNP in the last 8760 hours. Basic Metabolic Panel: Recent Labs  Lab 02/20/19 0809 02/21/19 0447 02/22/19 0226 02/24/19 0111 02/25/19 0235  NA 135 132* 134* 134* 134*  K 3.6 3.4* 3.6 3.7 4.0  CL 98 96* 95* 98 97*  CO2 '26 25 26 27 27  '$ GLUCOSE 104* 108* 109* 110* 117*  BUN '12 13 12 11 8  '$ CREATININE 1.03 0.91 0.89 0.83 0.86  CALCIUM 9.0 8.8* 8.7* 8.2* 8.6*   Liver Function Tests: No results for input(s): AST, ALT, ALKPHOS, BILITOT, PROT, ALBUMIN in the last 168 hours. No results for input(s): LIPASE, AMYLASE in the last 168 hours. No results for input(s): AMMONIA in the last 168 hours. CBC: Recent Labs  Lab 02/20/19 0809 02/21/19 0447 02/22/19 0226 02/24/19 0111 02/25/19 0235  WBC 10.3 8.7 9.4 7.7 7.5  NEUTROABS  --  6.7  --   --   --   HGB 11.2* 10.6* 11.1* 9.3* 9.4*  HCT 34.0* 31.7* 32.4* 27.2* 27.9*  MCV 97.7 95.5 94.5 94.4 94.9  PLT 258 249 280 252 274   Cardiac Enzymes: No results for input(s): CKTOTAL, CKMB, CKMBINDEX, TROPONINI in the last 168 hours. BNP: Invalid input(s): POCBNP CBG: No results for input(s): GLUCAP in the last 168 hours. D-Dimer No results for input(s): DDIMER in the last 72 hours. Hgb A1c No results for input(s): HGBA1C in the last 72 hours. Lipid Profile No results for input(s): CHOL,  HDL, LDLCALC, TRIG, CHOLHDL, LDLDIRECT in the last 72 hours. Thyroid function studies No results for input(s): TSH, T4TOTAL, T3FREE, THYROIDAB in the last 72 hours.  Invalid input(s): FREET3 Anemia work up No results for input(s): VITAMINB12, FOLATE, FERRITIN, TIBC, IRON, RETICCTPCT in the last 72 hours. Urinalysis    Component Value Date/Time   COLORURINE YELLOW 07/31/2018 1048   APPEARANCEUR CLEAR 07/31/2018 1048   LABSPEC 1.012 07/31/2018 1048   PHURINE 8.0 07/31/2018 [place] 07/31/2018 1048   HGBUR NEGATIVE 07/31/2018 [place] 07/31/2018 [place] 07/31/2018 1048   PROTEINUR 30 (A) 07/31/2018 1048   NITRITE NEGATIVE 07/31/2018 1048   LEUKOCYTESUR NEGATIVE 07/31/2018 1048   Sepsis Labs Invalid input(s): PROCALCITONIN,  WBC,  LACTICIDVEN Microbiology Recent Results (from the past 240 hour(s))  SARS CORONAVIRUS 2 (TAT 6-24 HRS) Nasopharyngeal Nasopharyngeal Swab     Status: None   Collection Time: 02/17/19  2:48 PM   Specimen: Nasopharyngeal Swab  Result Value Ref Range Status   SARS Coronavirus 2 NEGATIVE NEGATIVE Final    Comment: (NOTE) SARS-CoV-2 target nucleic acids are NOT DETECTED. The SARS-CoV-2 RNA is generally detectable in upper and lower respiratory specimens during the acute phase of infection. Negative results do not preclude SARS-CoV-2 infection, do not rule out co-infections with other pathogens, and should not be used as the sole basis for treatment or other patient management decisions. Negative results must be combined with clinical observations, patient history, and epidemiological information. The expected result is Negative. Fact Sheet for Patients: SugarRoll.be Fact Sheet for Healthcare Providers: https://www.woods-mathews.com/ This test is not yet approved or cleared by the [place] FDA and  has been authorized for detection and/or diagnosis of  SARS-CoV-2 by FDA under an Emergency Use Authorization (EUA). This EUA will remain  in effect (meaning this test can be used) for the duration of the COVID-19 declaration under Section 56 4(b)(1) of the Act, 21 U.S.C. section 360bbb-3(b)(1), unless the authorization is terminated or revoked sooner. Performed at [facility], [place] [address], [place], [place] [address]   MRSA PCR Screening     Status: None   Collection Time: 02/18/19  5:00 PM  Result Value Ref Range Status   MRSA by PCR NEGATIVE NEGATIVE Final    Comment:        The GeneXpert MRSA Assay (FDA approved for NASAL specimens only), is one component of a comprehensive MRSA colonization surveillance program. It is not intended to diagnose MRSA infection nor to guide or monitor treatment for MRSA infections. Performed at [facility], [place] [address] [place], [place] [address]   Culture, blood (Routine X 2) w Reflex to ID Panel     Status: None   Collection Time: 02/19/19 11:18 AM   Specimen: BLOOD  Result Value Ref Range Status   Specimen Description BLOOD RIGHT ANTECUBITAL  Final   Special Requests   Final    BOTTLES DRAWN AEROBIC AND ANAEROBIC Blood Culture results may not be optimal due to an excessive volume of blood received in culture bottles   Culture   Final    NO GROWTH 5 DAYS Performed at [facility], [place] [address] [place], [place] [address]    Report Status 02/24/2019 FINAL  Final  Culture, blood (Routine X 2) w Reflex to ID Panel     Status: None   Collection Time: 02/19/19 11:18 AM   Specimen: BLOOD RIGHT ARM  Result Value Ref Range Status   Specimen Description BLOOD RIGHT ARM  Final   Special Requests   Final    BOTTLES DRAWN AEROBIC AND ANAEROBIC Blood Culture results may not be optimal due to an excessive volume of blood received in culture bottles   Culture   Final    NO GROWTH 5 DAYS Performed at [facility], [place] [address], [place], [place] [address]    Report  Status 02/24/2019 FINAL  Final     Patient was seen and examined on the day of discharge and was found to be in stable condition. Time coordinating discharge: 40 minutes including assessment and coordination of care, as well as examination of the patient.   SIGNED:  Dessa Phi, DO [facility] 02/26/2019, 10:03  AM    

## 2019-02-26 NOTE — TOC Transition Note (Addendum)
Transition of Care [facility]) - CM/SW Discharge Note   Patient Details  Name: [name] MRN: [id number] Date of Birth: 04-Mar-1960  Transition of [facility]) CM/SW Contact:  Carles Collet, RN Phone Number: 02/26/2019, 10:12 AM   Clinical Narrative:    Received verbal order from Dr Maylene Roes that patient does not need Oxygen or humidity for trach. All other supplies have been delivered to the house. I have notified [place] that he is discharging this morning, and I have notified [employer] that patient is discharging and will not need O2 or Humidity. Wife will provide transport home.   Rn to send home with a sz6 and a sz5 shiley.     Final next level of care: [place] Barriers to Discharge: No Barriers Identified   Patient Goals and CMS Choice Patient states their goals for this hospitalization and ongoing recovery are:: to go home CMS Medicare.gov Compare Post Acute Care list provided to:: Patient Choice offered to / list presented to : Patient  Discharge Placement                       Discharge Plan and Services                DME Arranged: Trach supplies DME Agency: [employer] [facility] Date DME Agency Contacted: 02/25/19 Time DME Agency Contacted: 1100 Representative spoke with at DME Agency: Caryl Pina HH Arranged: PT, RN [facility] Agency: [place] ([place]) Date [place]: 02/26/19 Time [place]: 1012 Representative spoke with at [place]: [place] ([place]) Interventions     Readmission Risk Interventions No flowsheet data found.

## 2019-02-27 ENCOUNTER — Other Ambulatory Visit (HOSPITAL_COMMUNITY): Payer: Self-pay | Admitting: *Deleted

## 2019-02-27 ENCOUNTER — Telehealth (HOSPITAL_COMMUNITY): Payer: Self-pay

## 2019-02-27 MED ORDER — LIDOCAINE-PRILOCAINE 2.5-2.5 % EX CREA: 1.0000 "application " | TOPICAL_CREAM | CUTANEOUS | 5 refills | Status: DC | PRN

## 2019-02-27 NOTE — Telephone Encounter (Signed)
Pt agreed to f/u in 6 months. AW

## 2019-02-28 ENCOUNTER — Other Ambulatory Visit (HOSPITAL_COMMUNITY): Payer: Self-pay

## 2019-02-28 ENCOUNTER — Other Ambulatory Visit: Payer: Self-pay

## 2019-02-28 ENCOUNTER — Ambulatory Visit
Admission: RE | Admit: 2019-02-28 | Discharge: 2019-02-28 | Disposition: A | Discharge status: Home / Self Care | Payer: 59 | Source: Ambulatory Visit | Attending: Radiation Oncology | Admitting: Radiation Oncology

## 2019-02-28 ENCOUNTER — Encounter: Payer: Self-pay | Admitting: *Deleted

## 2019-02-28 DIAGNOSIS — C329 Malignant neoplasm of larynx, unspecified: Secondary | ICD-10-CM

## 2019-02-28 DIAGNOSIS — C321 Malignant neoplasm of supraglottis: Secondary | ICD-10-CM | POA: Diagnosis not present

## 2019-02-28 MED ORDER — SODIUM CHLORIDE 0.9% FLUSH
10.0000 mL | Freq: Once | INTRAVENOUS | Status: AC
  Administered 2019-02-28: 10 mL via INTRAVENOUS

## 2019-02-28 NOTE — Addendum Note (Signed)
Encounter addended by: Monea Pesantez, Stephani Police, RN on: 02/28/2019 4:14 PM  Actions taken: [facility] administration accepted

## 2019-03-03 ENCOUNTER — Other Ambulatory Visit (HOSPITAL_COMMUNITY): Payer: Self-pay

## 2019-03-03 ENCOUNTER — Other Ambulatory Visit: Payer: Self-pay | Admitting: Radiation Oncology

## 2019-03-03 ENCOUNTER — Ambulatory Visit: Payer: 59 | Admitting: Radiation Oncology

## 2019-03-03 DIAGNOSIS — C321 Malignant neoplasm of supraglottis: Secondary | ICD-10-CM

## 2019-03-03 DIAGNOSIS — C329 Malignant neoplasm of larynx, unspecified: Secondary | ICD-10-CM

## 2019-03-03 MED ORDER — MISC. DEVICES MISC: 0 refills | Status: DC

## 2019-03-03 NOTE — Progress Notes (Signed)
Oncology Nurse Navigator Documentation  Met with Mr. Linton Rump and his wife for feeding tube education prior to CT [facility].  He had 18 Fr. gastrostomy tube surgically placed 2/22 during recent hospitalization.  He denied difficulty with daily flushes. . Provided education for use and care, including: hand hygiene, gravity bolus administration of daily water flushes and nutritional supplement, fluids and medications; care of tube insertion site including daily dressing change and cleaning; S&S of infection.   . Mr. And Mrs. Burkemper correctly verbalized procedures for and provided correct return demonstration of gravity administration of water, dressing change and site care.  Note:  Because retention ring sutured to skin, demonstrated alternate dressing technique with 2x2 gauze pending suture removal at which time they understand drainage sponges can used. . I provided written instructions for G-tube flushing/dressing change in support of verbal instruction.   . I provided/described contents of Start of Care [place] Feeding Kit (3 syringes, 2 boxes 4x4 drainage sponges, package of mesh briefs, 1 roll paper tape, case of Osmolite 1.5).  He voiced understanding he is to start using Osmolite per guidance of Nutritionist. . He understands I will be available for ongoing support with gastrostomy.  Proceeded to [facility] for RT planning.  He tolerated procedure without difficulty, denied questions/concerns.    I toured them to [facility] treatment area, explained procedures for lobby registration, arrival to Radiation Waiting, arrival to tmt area and preparation for tmt.  They voiced understanding.    I provided/reviewed Epic appointment calendar.  They voiced understanding I will check to see if adjustment can be made to Tuesday schedule so RT precedes chemotherapy which is scheduled at [facility].  I encouraged them to call me with questions/concerns moving forward.  [name], RN, BSN Head & Neck Oncology Nurse  [place] [facility] [place] [phone]

## 2019-03-04 ENCOUNTER — Ambulatory Visit: Payer: 59 | Admitting: Radiation Oncology

## 2019-03-04 ENCOUNTER — Ambulatory Visit (HOSPITAL_COMMUNITY): Payer: 59 | Admitting: Hematology

## 2019-03-04 ENCOUNTER — Emergency Department (HOSPITAL_COMMUNITY): Payer: 59

## 2019-03-04 ENCOUNTER — Emergency Department (HOSPITAL_COMMUNITY)
Admission: EM | Admit: 2019-03-04 | Discharge: 2019-03-04 | Disposition: A | Discharge status: Home / Self Care | Payer: 59 | Attending: Emergency Medicine | Admitting: Emergency Medicine

## 2019-03-04 ENCOUNTER — Encounter (HOSPITAL_COMMUNITY): Payer: Self-pay | Admitting: Emergency Medicine

## 2019-03-04 ENCOUNTER — Other Ambulatory Visit: Payer: Self-pay

## 2019-03-04 ENCOUNTER — Ambulatory Visit: Payer: 59

## 2019-03-04 ENCOUNTER — Encounter (HOSPITAL_COMMUNITY): Payer: Self-pay | Admitting: *Deleted

## 2019-03-04 ENCOUNTER — Other Ambulatory Visit (HOSPITAL_COMMUNITY): Payer: 59

## 2019-03-04 ENCOUNTER — Ambulatory Visit (HOSPITAL_COMMUNITY): Payer: 59

## 2019-03-04 DIAGNOSIS — Z20822 Contact with and (suspected) exposure to covid-19: Secondary | ICD-10-CM | POA: Insufficient documentation

## 2019-03-04 DIAGNOSIS — C321 Malignant neoplasm of supraglottis: Secondary | ICD-10-CM | POA: Diagnosis present

## 2019-03-04 DIAGNOSIS — I2510 Atherosclerotic heart disease of native coronary artery without angina pectoris: Secondary | ICD-10-CM | POA: Insufficient documentation

## 2019-03-04 DIAGNOSIS — Z87891 Personal history of nicotine dependence: Secondary | ICD-10-CM | POA: Diagnosis not present

## 2019-03-04 DIAGNOSIS — Z510 Encounter for antineoplastic radiation therapy: Secondary | ICD-10-CM | POA: Diagnosis not present

## 2019-03-04 DIAGNOSIS — J9501 Hemorrhage from tracheostomy stoma: Secondary | ICD-10-CM | POA: Insufficient documentation

## 2019-03-04 DIAGNOSIS — Z5111 Encounter for antineoplastic chemotherapy: Secondary | ICD-10-CM | POA: Diagnosis present

## 2019-03-04 DIAGNOSIS — N289 Disorder of kidney and ureter, unspecified: Secondary | ICD-10-CM | POA: Diagnosis not present

## 2019-03-04 LAB — COMPREHENSIVE METABOLIC PANEL
ALT: 23 U/L (ref 0–44)
AST: 19 U/L (ref 15–41)
Albumin: 3.5 g/dL (ref 3.5–5.0)
Alkaline Phosphatase: 55 U/L (ref 38–126)
Anion gap: 9 (ref 5–15)
BUN: 11 mg/dL (ref 6–20)
CO2: 25 mmol/L (ref 22–32)
Calcium: 9.1 mg/dL (ref 8.9–10.3)
Chloride: 102 mmol/L (ref 98–111)
Creatinine, Ser: 0.93 mg/dL (ref 0.61–1.24)
GFR calc Af Amer: >60 mL/min (ref >60)
GFR calc non Af Amer: >60 mL/min (ref >60)
Glucose, Bld: 131 mg/dL — ABNORMAL HIGH (ref 70–99)
Potassium: 3.7 mmol/L (ref 3.5–5.1)
Sodium: 136 mmol/L (ref 135–145)
Total Bilirubin: 0.5 mg/dL (ref 0.3–1.2)
Total Protein: 7.4 g/dL (ref 6.5–8.1)

## 2019-03-04 LAB — CBC WITH DIFFERENTIAL/PLATELET
Abs Immature Granulocytes: 0.1 10*3/uL — ABNORMAL HIGH (ref 0.00–0.07)
Basophils Absolute: 0 10*3/uL (ref 0.0–0.1)
Basophils Relative: 0 %
Eosinophils Absolute: 0.1 10*3/uL (ref 0.0–0.5)
Eosinophils Relative: 1 %
HCT: 33.4 % — ABNORMAL LOW (ref 39.0–52.0)
Hemoglobin: 11 g/dL — ABNORMAL LOW (ref 13.0–17.0)
Immature Granulocytes: 1 %
Lymphocytes Relative: 14 %
Lymphs Abs: 1.7 10*3/uL (ref 0.7–4.0)
MCH: 31.7 pg (ref 26.0–34.0)
MCHC: 32.9 g/dL (ref 30.0–36.0)
MCV: 96.3 fL (ref 80.0–100.0)
Monocytes Absolute: 1 10*3/uL (ref 0.1–1.0)
Monocytes Relative: 9 %
Neutro Abs: 8.8 10*3/uL — ABNORMAL HIGH (ref 1.7–7.7)
Neutrophils Relative %: 75 %
Platelets: 485 10*3/uL — ABNORMAL HIGH (ref 150–400)
RBC: 3.47 MIL/uL — ABNORMAL LOW (ref 4.22–5.81)
RDW: 12.9 % (ref 11.5–15.5)
WBC: 11.8 10*3/uL — ABNORMAL HIGH (ref 4.0–10.5)
nRBC: 0 % (ref 0.0–0.2)

## 2019-03-04 LAB — RESPIRATORY PANEL BY RT PCR (FLU A&B, COVID)
Influenza A by PCR: NEGATIVE
Influenza B by PCR: NEGATIVE
SARS Coronavirus 2 by RT PCR: NEGATIVE

## 2019-03-04 LAB — PROTIME-INR
INR: 1 (ref 0.8–1.2)
Prothrombin Time: 12.6 seconds (ref 11.4–15.2)

## 2019-03-04 MED ORDER — IOHEXOL 350 MG/ML SOLN
100.0000 mL | Freq: Once | INTRAVENOUS | Status: AC | PRN
  Administered 2019-03-04: 100 mL via INTRAVENOUS

## 2019-03-04 MED ORDER — TRANEXAMIC ACID FOR EPISTAXIS
500.0000 mg | Freq: Once | TOPICAL | Status: AC
  Administered 2019-03-04: 500 mg via TOPICAL
  Filled 2019-03-04: qty 10

## 2019-03-04 NOTE — Procedures (Signed)
Procedure: Exploration and control of hemorrhage from the tracheastomy stoma  Anesthesia: None  Indication: [name] is a 59 y.o. male with multiple medical problems including a history of a supraglottic laryngeal cancer, who presents the emergency department today with bleeding around his trach. This morning, he woke up with a coughing fit.  After coughing fit he was noted to have bleeding from around his trach site. The bleeding was severe and could not be controlled at [place]. He was transferred to [facility] ER for further treatment.  Description: The patient was placed supine on the ER table.  The patient's #6 Shiley tracheostomy tube was removed.  A large amount of blood clots were removed from the tracheostomy stoma.  The tracheostomy stoma was explored.  A small amount of bleeding was noted from the thyroid tissue.  No active arterial bleeding was noted.  The bleeding was controlled with local pressure and packing with SurgiSnow.  Good hemostasis was achieved.  The #6 Shiley tracheostomy tube was replaced. The patient tolerated the procedure well.    Follow up: The patient is instructed to withhold his Plavix for 1 week.  He may follow-up in my office as needed.  The patient is scheduled to undergo his chemoradiation therapy this week.

## 2019-03-04 NOTE — ED Provider Notes (Signed)
Signout from Dr. Dolly Rias.  59 year old male with history of supraglottic cancer and recent tracheostomy placed 2 weeks ago here with bleeding at his trach site.  Blood seems to be coming from both around the trach and through the cannula. Physical Exam  BP (!) 138/91   Pulse 73   Temp 99 F (37.2 C) (Oral)   Resp 18   Ht 5\' 8"  (1.727 m)   Wt 81.6 kg   SpO2 99%   BMI 27.37 kg/m   Physical Exam  ED Course/Procedures     Procedures  MDM  Dr. Melene Plan ENT aware of patient and recommend transfer to [name] ED for further evaluation.  Accepted to ED by Dr. Dina Rich.  Awaiting transfer       Hayden Rasmussen, MD 03/04/19 1416

## 2019-03-04 NOTE — ED Provider Notes (Addendum)
Emergency Department Provider Note   I have reviewed the triage vital signs and the nursing notes.   HISTORY  Chief Complaint Coagulation Disorder   HPI [name] is a 59 y.o. male with multiple medical problems but most importantly has a history of a supraglottic laryngeal cancer  who presents the emergency department today with bleeding around his trach.  Patient states that he was getting routine dental extraction in preparation for chemotherapy and radiation last week.  From review of the notes appears that he had a very difficult airway on induction.  With concern for possible post intubation edema, bleeding and airway protection he received intraoperative tracheostomy and was discharged ICU.  He has not had a complication since then but woke up tonight with a coughing fit.  After coughing fit he was noted since bleeding from around his trach site so presents here for further evaluation.  No other associated symptoms.  No other associated or modifying symptoms.    Past Medical History:  Diagnosis Date  . Anxiety   . Arthritis    back  . Bradycardia    a. During 11/2012 adm: lopressor decreased.  . Cancer of larynx ([place])   . Carotid artery stenosis   . 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 Coronary artery disease    a. Diag cath 10/2012 for CP/abnormal nuc -> planned PCI s/p LAD atherectomy/DES placement 11/05/12.  Marland Kitchen GERD (gastroesophageal reflux disease)   . History of hiatal hernia   . Hypercholesteremia   . Hypertension   . Stroke ([place]) 07/31/2018   bilateral vertebrobasilar occlusion; "left side weaker thanit was before."    Patient Active Problem List   Diagnosis Date Noted  . Hx of tracheostomy 02/18/2019  . Head and neck cancer ([place]) 02/18/2019  . Malignant neoplasm of supraglottis ([place]) 01/22/2019  . Stroke ([place]) 07/31/2018  . Rotator cuff syndrome of right shoulder  02/20/2013  . Bradycardia 11/06/2012  . Coronary artery disease   . Abnormal finding on cardiovascular stress test 09/25/2012  . Hypertension 09/03/2012  . Chest pain 09/03/2012  . Alcohol abuse, daily use 09/03/2012  . Hyperlipidemia 09/03/2012  . Hyponatremia 09/03/2012    Past Surgical History:  Procedure Laterality Date  . CARDIAC CATHETERIZATION  10/29/2012  . CORONARY ANGIOPLASTY WITH STENT PLACEMENT  11/05/2012  . DIRECT LARYNGOSCOPY N/A 01/02/2019   Procedure: MICRO DIRECT LARYNGOSCOPY BIOPSY OF LARYNGEAL MASS;  Surgeon: [name], MD;  Location: [place] [facility];  Service: ENT;  Laterality: N/A;  . HEMORRHOID SURGERY  ~ 2010  . INSERTION OF MESH N/A 06/02/2015   Procedure: INSERTION OF MESH;  Surgeon: [name], MD;  Location: [facility];  Service: General;  Laterality: N/A;  . IR ANGIO INTRA EXTRACRAN SEL COM CAROTID INNOMINATE BILAT MOD SED  08/02/2018  . IR ANGIO VERTEBRAL SEL VERTEBRAL BILAT MOD SED  08/02/2018  . LAPAROSCOPIC INSERTION GASTROSTOMY TUBE N/A 02/24/2019   Procedure: LAPAROSCOPIC INSERTION GASTROSTOMY TUBE;  Surgeon: [name], MD;  Location: [facility];  Service: General;  Laterality: N/A;  . LEFT HEART CATHETERIZATION WITH CORONARY ANGIOGRAM N/A 10/30/2012   Procedure: LEFT HEART CATHETERIZATION WITH CORONARY ANGIOGRAM;  Surgeon: [name], MD;  Location: [facility];  Service: Cardiovascular;  Laterality: N/A;  . MULTIPLE EXTRACTIONS WITH ALVEOLOPLASTY N/A 02/18/2019   Procedure: Extraction of tooth #'s 5-7, 10,11,14,20-29, 31 and 32 with alveoloplasty and maxiillary left buccal  exostoses reductions;  Surgeon: [name], [name];  Location: [facility];  Service: Oral Surgery;  Laterality: N/A;  . PERCUTANEOUS CORONARY ROTOBLATOR INTERVENTION (PCI-R) N/A 11/05/2012   Procedure: PERCUTANEOUS CORONARY ROTOBLATOR INTERVENTION (PCI-R);  Surgeon: [name], MD;  Location: [facility] CATH LAB;  Service: Cardiovascular;  Laterality: N/A;  . TRACHEOSTOMY TUBE PLACEMENT N/A  02/18/2019   Procedure: Tracheostomy;  Surgeon: [name], MD;  Location: [facility];  Service: ENT;  Laterality: N/A;  . UMBILICAL HERNIA REPAIR N/A 06/02/2015   Procedure: UMBILICAL HERNIORRHAPHY WITH MESH;  Surgeon: [name], MD;  Location: [facility];  Service: General;  Laterality: N/A;    Current Outpatient Rx  . Order #: [id number] Class: Normal  . Order #: [id number] Class: Historical Med  . Order #: [id number] Class: Normal  . Order #: [id number] Class: Normal  . Order #: [id number] Class: Historical Med  . Order #: [id number] Class: Normal  . Order #: [id number] Class: Print  . Order #: [id number] Class: Normal  . Order #: [id number] Class: Normal  . Order #: [id number] Class: Normal  . Order #: [id number] Class: Normal  . Order #: [id number] Class: Normal  . Order #: [id number] Class: Historical Med  . Order #: [id number] Class: Normal  . Order #: [id number] Class: Normal  . Order #: [id number] Class: Historical Med    Allergies Prednisone  Family History  Problem Relation Age of Onset  . Hypertension Mother   . Heart disease Father   . Hypertension Father   . Heart disease Sister   . Hypertension Maternal Grandmother   . Hypertension Maternal Grandfather   . Hypertension Paternal Grandmother   . Hypertension Paternal Grandfather     Social History Social History   Tobacco Use  . Smoking status: Former Smoker    Packs/day: 3.00    Years: 32.00    Pack years: 96.00    Types: Cigarettes    Start date: 08/29/1977    Quit date: 11/29/2006    Years since quitting: 12.2  . Smokeless tobacco: Former Systems developer    Types: Chew  . Tobacco comment: 11/05/2012 "chewed tobacco when I play ball; aien't chewed since age 74"  Substance Use Topics  . Alcohol use: Yes    Alcohol/week: 12.0 standard drinks    Types: 12 Cans of beer per week    Comment: Currently 3-4 beers per day. 02/13/19, was a heavy drinker in the past, - states he could drink a case a day   . Drug use: Yes    Types: Marijuana    Review of  Systems  All other systems negative except as documented in the HPI. All pertinent positives and negatives as reviewed in the HPI. ____________________________________________   PHYSICAL EXAM:  VITAL SIGNS: ED Triage Vitals  Enc Vitals Group     BP 03/04/19 0425 (!) 172/90     Pulse Rate 03/04/19 0425 80     Resp 03/04/19 0425 18     Temp 03/04/19 0425 99 F (37.2 C)     Temp Source 03/04/19 0425 Oral     SpO2 03/04/19 0425 94 %     Weight 03/04/19 0420 180 lb (81.6 kg)     Height 03/04/19 0420 5\' 8"  (1.727 m)    Constitutional: Alert and oriented. Well appearing and in no acute distress. Eyes: Conjunctivae are normal. PERRL. EOMI. Head: Atraumatic. Nose: No congestion/rhinnorhea. Mouth/Throat: Mucous membranes are moist.  Oropharynx non-erythematous. Neck: No stridor.  No meningeal signs.  Tracheostomy present with consistently oozing blood around the 7:00 position. No obvious source.  Cardiovascular: Normal rate,  regular rhythm. Good peripheral circulation. Grossly normal heart sounds.   Respiratory: Normal respiratory effort.  No retractions. Lungs CTAB. Gastrointestinal: Soft and nontender. No distention.  Musculoskeletal: No lower extremity tenderness nor edema. No gross deformities of extremities. Neurologic:  Normal speech and language. No gross focal neurologic deficits are appreciated.  Skin:  Skin is warm, dry and intact. No rash noted.  ____________________________________________   LABS (all labs ordered are listed, but only abnormal results are displayed)  Labs Reviewed  CBC WITH DIFFERENTIAL/PLATELET - Abnormal; Notable for the following components:      Result Value   WBC 11.8 (*)    RBC 3.47 (*)    Hemoglobin 11.0 (*)    HCT 33.4 (*)    Platelets 485 (*)    Neutro Abs 8.8 (*)    Abs Immature Granulocytes 0.10 (*)    All other components within normal limits  COMPREHENSIVE METABOLIC PANEL - Abnormal; Notable for the following components:   Glucose,  Bld 131 (*)    All other components within normal limits  PROTIME-INR   ____________________________________________  EKG   EKG Interpretation  Date/Time:    Ventricular Rate:    PR Interval:    QRS Duration:   QT Interval:    QTC Calculation:   R Axis:     Text Interpretation:         ____________________________________________  RADIOLOGY  CT Angio Neck W and/or Wo Contrast  Result Date: 03/04/2019 CLINICAL DATA:  Penetrating neck trauma. Tracheostomy tube in place. Patient awoke with hemoptysis today. Neck cancer. EXAM: CT ANGIOGRAPHY NECK TECHNIQUE: Multidetector CT imaging of the neck was performed using the standard protocol during bolus administration of intravenous contrast. Multiplanar CT image reconstructions and MIPs were obtained to evaluate the vascular anatomy. Carotid stenosis measurements (when applicable) are obtained utilizing NASCET criteria, using the distal internal carotid diameter as the denominator. CONTRAST:  12mL OMNIPAQUE IOHEXOL 350 MG/ML SOLN COMPARISON:  CT neck 12/26/2018 FINDINGS: Aortic arch: Atherosclerotic calcifications are again noted at the arch and origins the great vessels without a significant stenosis relative to the more distal vessels. Aneurysm is present. Right carotid system: Mural calcifications are present in the right common carotid artery. Circumferential calcifications are present at the carotid bifurcation. The lumen is narrowed 2 mm. This compares with a more distal segment of 4.4 mm. Left carotid system: Mural calcifications are present in the left common carotid artery without significant stenosis. More prominent calcifications are present at the bifurcation without. Dense lateral calcifications are present in the proximal left ICA the lumen is narrowed to 2.2 mm. This compares with a more normal distal lumen 5.1 mm. Extensive calcifications are present in the distal left ICA at the skull base. Lumen is narrowed to less than 1 mm is.  Incidental imaging of the cavernous internal carotid artery demonstrates moderate to high-grade stenoses bilaterally. Lumen is narrowed to approximately 1 mm on both sides. ICA termini and proximal A1 and M1 segments are normal. Vertebral arteries: The vertebral arteries demonstrate proximal calcifications bilaterally without significant stenosis. Medial mural calcification is present in the left vertebral artery at C4-C5 and C6 without significant stenosis. Calcifications are present in the right vertebral artery at C3 without significant stenosis. Moderate stenosis of the dominant vessel is present at the dural margin secondary to significant atherosclerotic calcifications. The right vertebral artery terminates at the PICA. The left vertebral artery and proximal basilar artery are again noted to be occluded. Basilar artery is reconstituted just below the tip.  Both posterior cerebral arteries are patent. Skeleton: Degenerative changes are again noted in the lower cervical spine. Patient is edentulous. No focal lytic or blastic lesions are present. Other neck: Tracheostomy tube is in place. Soft tissues around the tube are unremarkable. Left supraglottic mass lesion is again seen. Bilateral level 3 lymph nodes have slightly decreased in size. Upper chest: Centrilobular emphysematous changes are present. No nodule or mass lesion is present. Thoracic inlet is otherwise within normal limits. Right-sided PICC line is noted. IMPRESSION: 1. No acute vascular abnormality to explain hemoptysis. 2. Left supraglottic mass lesion may be the source of hemorrhage. There is no significant interval change. 3. Slight decrease in metastatic bilateral level 3 adenopathy. 4. 55% stenosis of the proximal right internal carotid artery. 5. 60% stenosis of the proximal left internal carotid artery. 6. High-grade stenosis of the distal cervical left ICA and petrous left ICA. 7. Moderate to high-grade stenosis of the cavernous internal  carotid arteries bilaterally. 8. 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 The distal basilar artery is reconstituted, likely from retrograde flow. Electronically Signed   By: San Morelle M.D.   On: 03/04/2019 06:57   DG Chest Portable 1 View  Result Date: 03/04/2019 CLINICAL DATA:  Hemoptysis. Head and neck tumor. Question aspiration. EXAM: PORTABLE CHEST 1 VIEW COMPARISON:  One-view chest x-ray 02/18/2019 FINDINGS: Heart size is normal. Tracheostomy tube is in satisfactory position. Right-sided PICC line terminates in the distal SVC, above the cavoatrial junction. No edema or effusion is present. No focal airspace disease is present. The visualized soft tissues and bony thorax are otherwise unremarkable. IMPRESSION: No acute cardiopulmonary disease or significant interval change. Electronically Signed   By: San Morelle M.D.   On: 03/04/2019 05:11    ____________________________________________   PROCEDURES  Procedure(s) performed:   .Critical Care Performed by: Merrily Pew, MD Authorized by: Merrily Pew, MD   Critical care provider statement:    Critical care time (minutes):  45   Critical care was necessary to treat or prevent imminent or life-threatening deterioration of the following conditions:  Circulatory failure and respiratory failure   Critical care was time spent personally by me on the following activities:  Discussions with consultants, evaluation of patient's response to treatment, examination of patient, ordering and performing treatments and interventions, ordering and review of laboratory studies, ordering and review of radiographic studies, pulse oximetry, re-evaluation of patient's condition, obtaining history from patient or surrogate and review of old charts     ____________________________________________   INITIAL IMPRESSION / ASSESSMENT AND PLAN / ED COURSE  As this tracheostomy is fresh and he has this  known airway mass with bleeding I am very hesitant to explore further the exact location of bleeding with concern for airway protection.  I will consult ear nose and throat immediately.  Check a CBC, CMP, chest x-ray.  No blood thinners to worry about reversing.  He is on Plavix.  8099: Dr. Constance Holster called back. Not on call for Dr. Benjamine Mola, will attempt to contact.   8338: will try nebulized TXA to see if it might help some. Still HDS and respiratory intact.   0505: discussed with emergency line who will page on call for Dr. Benjamine Mola. Still bleeding.   0520: discussed with dr Benjamine Mola who states this is relatively common. Suggested packing the suspected bleeding site and follow up in office. If it doesn't stop, will need transfer [facility] for [facility] exploration.   2505: no bleeding apparent approximately  20 minutes after nebulized txa. Will ct neck to eval for major vessel compromise, continue observation. May not need to pack.   9937: reevaluated. Minimal amount of blood from under bottom edge of trach. Hard to tell if it is active bleeding, it is nothing like prior to coming in. unstrapped trach and explored locally as much as possible and lots of clot, no obvious source of bleeding. Will proceed with ct scan. Still stable.   CT without evidence of active extravasation. Still oozing blood around trach site and coughing up red blood with some small clots. repaged Dr. Benjamine Mola. Recommended packing stoma with gauze and transfer to [facility] ED for eval. Patient is not tolerating manipulation of his tracheostomy cannula very well to allow me to pack it and with a likely tenuous airway and not actively briskly bleeding I think the risk of manipulating too much has high risk with minimal benefit in this setting if bleeding worsens, would reconsider. Discussed with Dr. Dina Rich at [facility] for accepting physician transfer to ED. Care transferred to Dr. Melina Copa pending transport hemodynamically stable.   Pertinent labs & imaging  results that were available during my care of the patient were reviewed by me and considered in my medical decision making (see chart for details).  ____________________________________________  FINAL CLINICAL IMPRESSION(S) / ED DIAGNOSES  Final diagnoses:  None     MEDICATIONS GIVEN DURING THIS VISIT:  Medications  tranexamic acid (CYKLOKAPRON) 1000 MG/10ML topical solution 500 mg (500 mg Topical Given 03/04/19 0506)  iohexol (OMNIPAQUE) 350 MG/ML injection 100 mL (100 mLs Intravenous Contrast Given 03/04/19 0620)     NEW OUTPATIENT MEDICATIONS STARTED DURING THIS VISIT:  New Prescriptions   No medications on file    Note:  This note was prepared with assistance of Dragon voice recognition software. Occasional wrong-word or sound-a-like substitutions may have occurred due to the inherent limitations of voice recognition software.   Khrista Braun, Corene Cornea, MD 03/04/19 1696    Merrily Pew, MD 03/04/19 [phone]

## 2019-03-04 NOTE — ED Triage Notes (Signed)
Pt's trach is bleeding from waking up coughing this am around 0300. Pt is had trach done 2/19.

## 2019-03-04 NOTE — ED Notes (Signed)
ENT MD at bedside. Replaced trach collar. No active bleeding from trach at this time.

## 2019-03-04 NOTE — ED Notes (Addendum)
Called [facility] for transportation to [facility] ER>

## 2019-03-04 NOTE — Progress Notes (Signed)
I spoke with [name] today via telephone.  They were getting ready to leave the hospital.  I advised her to follow Dr. Deeann Saint orders on his plavix and not take it for 1 week. We will address it when they come in for his treatment next week.  I updated her on his schedule for the cancer center this week. She verbalizes understanding.

## 2019-03-04 NOTE — ED Provider Notes (Signed)
Patient arrives as a transfer from [facility] ED with recent tracheostomy that has been oozing blood since he had a coughing fit during the night. No abnormal findings on workup at [facility], but continued to ooze and so sent for evaluation by ENT.   Dr. Benjamine Mola is aware of the patient and will come evaluate the trach.   12:40 PM Patient seen by Dr. Benjamine Mola who has cleaned wound and packed with [place]. Patient cleared for discharge. Outpatient followup as needed.    Truddie Hidden, MD 03/04/19 [phone]

## 2019-03-05 ENCOUNTER — Encounter: Payer: Self-pay | Admitting: *Deleted

## 2019-03-05 ENCOUNTER — Ambulatory Visit
Admission: RE | Admit: 2019-03-05 | Discharge: 2019-03-05 | Disposition: A | Discharge status: Home / Self Care | Payer: 59 | Source: Ambulatory Visit | Attending: Radiation Oncology | Admitting: Radiation Oncology

## 2019-03-05 DIAGNOSIS — Z5111 Encounter for antineoplastic chemotherapy: Secondary | ICD-10-CM | POA: Diagnosis not present

## 2019-03-05 NOTE — Progress Notes (Signed)
Oncology Nurse Navigator Documentation   To provide support, encouragement and care continuity, met with Mr. Livsey for his initial RT.    I reviewed the 2-step treatment process, answered questions.   He completed treatment without difficulty, denied questions/concerns.  I reviewed the registration/arrival procedure for subsequent treatments.  I encouraged him to call me with questions/concerns as tmts proceed.  [name], RN, BSN Head & Neck Oncology Nurse [place] [facility] [place] [phone]

## 2019-03-06 ENCOUNTER — Encounter (HOSPITAL_COMMUNITY): Payer: Self-pay | Admitting: *Deleted

## 2019-03-06 ENCOUNTER — Other Ambulatory Visit: Payer: Self-pay

## 2019-03-06 ENCOUNTER — Inpatient Hospital Stay (HOSPITAL_COMMUNITY): Payer: 59

## 2019-03-06 ENCOUNTER — Ambulatory Visit
Admission: RE | Admit: 2019-03-06 | Discharge: 2019-03-06 | Disposition: A | Discharge status: Home / Self Care | Payer: 59 | Source: Ambulatory Visit | Attending: Radiation Oncology | Admitting: Radiation Oncology

## 2019-03-06 ENCOUNTER — Inpatient Hospital Stay (HOSPITAL_COMMUNITY): Payer: 59 | Attending: Hematology

## 2019-03-06 ENCOUNTER — Encounter (HOSPITAL_COMMUNITY): Payer: Self-pay | Admitting: Hematology

## 2019-03-06 ENCOUNTER — Inpatient Hospital Stay (HOSPITAL_BASED_OUTPATIENT_CLINIC_OR_DEPARTMENT_OTHER): Payer: 59 | Admitting: Hematology

## 2019-03-06 VITALS — Wt 171.3 lb

## 2019-03-06 VITALS — BP 90/56 | HR 65 | Temp 97.1°F | Resp 18

## 2019-03-06 DIAGNOSIS — Z5111 Encounter for antineoplastic chemotherapy: Secondary | ICD-10-CM | POA: Diagnosis not present

## 2019-03-06 DIAGNOSIS — C321 Malignant neoplasm of supraglottis: Secondary | ICD-10-CM | POA: Diagnosis not present

## 2019-03-06 DIAGNOSIS — C329 Malignant neoplasm of larynx, unspecified: Secondary | ICD-10-CM

## 2019-03-06 DIAGNOSIS — Z510 Encounter for antineoplastic radiation therapy: Secondary | ICD-10-CM | POA: Insufficient documentation

## 2019-03-06 DIAGNOSIS — Z87891 Personal history of nicotine dependence: Secondary | ICD-10-CM | POA: Insufficient documentation

## 2019-03-06 DIAGNOSIS — N289 Disorder of kidney and ureter, unspecified: Secondary | ICD-10-CM | POA: Insufficient documentation

## 2019-03-06 LAB — CBC WITH DIFFERENTIAL/PLATELET
Abs Immature Granulocytes: 0.13 10*3/uL — ABNORMAL HIGH (ref 0.00–0.07)
Basophils Absolute: 0.1 10*3/uL (ref 0.0–0.1)
Basophils Relative: 1 %
Eosinophils Absolute: 0.2 10*3/uL (ref 0.0–0.5)
Eosinophils Relative: 2 %
HCT: 30.2 % — ABNORMAL LOW (ref 39.0–52.0)
Hemoglobin: 9.6 g/dL — ABNORMAL LOW (ref 13.0–17.0)
Immature Granulocytes: 1 %
Lymphocytes Relative: 19 %
Lymphs Abs: 2 10*3/uL (ref 0.7–4.0)
MCH: 31.4 pg (ref 26.0–34.0)
MCHC: 31.8 g/dL (ref 30.0–36.0)
MCV: 98.7 fL (ref 80.0–100.0)
Monocytes Absolute: 1.2 10*3/uL — ABNORMAL HIGH (ref 0.1–1.0)
Monocytes Relative: 11 %
Neutro Abs: 7 10*3/uL (ref 1.7–7.7)
Neutrophils Relative %: 66 %
Platelets: 465 10*3/uL — ABNORMAL HIGH (ref 150–400)
RBC: 3.06 MIL/uL — ABNORMAL LOW (ref 4.22–5.81)
RDW: 13 % (ref 11.5–15.5)
WBC: 10.5 10*3/uL (ref 4.0–10.5)
nRBC: 0 % (ref 0.0–0.2)

## 2019-03-06 LAB — COMPREHENSIVE METABOLIC PANEL
ALT: 21 U/L (ref 0–44)
AST: 18 U/L (ref 15–41)
Albumin: 3.4 g/dL — ABNORMAL LOW (ref 3.5–5.0)
Alkaline Phosphatase: 52 U/L (ref 38–126)
Anion gap: 9 (ref 5–15)
BUN: 18 mg/dL (ref 6–20)
CO2: 23 mmol/L (ref 22–32)
Calcium: 8.5 mg/dL — ABNORMAL LOW (ref 8.9–10.3)
Chloride: 100 mmol/L (ref 98–111)
Creatinine, Ser: 1.71 mg/dL — ABNORMAL HIGH (ref 0.61–1.24)
GFR calc Af Amer: 50 mL/min — ABNORMAL LOW (ref >60)
GFR calc non Af Amer: 43 mL/min — ABNORMAL LOW (ref >60)
Glucose, Bld: 106 mg/dL — ABNORMAL HIGH (ref 70–99)
Potassium: 3.4 mmol/L — ABNORMAL LOW (ref 3.5–5.1)
Sodium: 132 mmol/L — ABNORMAL LOW (ref 135–145)
Total Bilirubin: 0.4 mg/dL (ref 0.3–1.2)
Total Protein: 6.8 g/dL (ref 6.5–8.1)

## 2019-03-06 LAB — MAGNESIUM: Magnesium: 2 mg/dL (ref 1.7–2.4)

## 2019-03-06 MED ORDER — SODIUM CHLORIDE 0.9 % IV SOLN
40.0000 mg/m2 | Freq: Once | INTRAVENOUS | Status: DC
  Filled 2019-03-06: qty 79

## 2019-03-06 MED ORDER — PROCHLORPERAZINE MALEATE 10 MG PO TABS: 10.0000 mg | ORAL_TABLET | Freq: Four times a day (QID) | ORAL | 1 refills | Status: DC | PRN

## 2019-03-06 MED ORDER — SODIUM CHLORIDE 0.9% FLUSH
10.0000 mL | INTRAVENOUS | Status: DC | PRN
  Administered 2019-03-06: 14:00:00 10 mL

## 2019-03-06 MED ORDER — SODIUM CHLORIDE 0.9 % IV SOLN: Freq: Once | INTRAVENOUS | Status: AC

## 2019-03-06 MED ORDER — HEPARIN SOD (PORK) LOCK FLUSH 100 UNIT/ML IV SOLN
250.0000 [IU] | Freq: Once | INTRAVENOUS | Status: AC | PRN
  Administered 2019-03-06: 14:00:00 250 [IU]

## 2019-03-06 MED ORDER — POTASSIUM CHLORIDE 2 MEQ/ML IV SOLN
Freq: Once | INTRAVENOUS | Status: DC
  Filled 2019-03-06: qty 10

## 2019-03-06 MED ORDER — SODIUM CHLORIDE 0.9 % IV SOLN
10.0000 mg | Freq: Once | INTRAVENOUS | Status: DC
  Filled 2019-03-06: qty 1

## 2019-03-06 MED ORDER — POTASSIUM CHLORIDE 2 MEQ/ML IV SOLN
Freq: Once | INTRAVENOUS | Status: AC
  Filled 2019-03-06: qty 10

## 2019-03-06 MED ORDER — PALONOSETRON HCL INJECTION 0.25 MG/5ML: 0.2500 mg | Freq: Once | INTRAVENOUS | Status: DC

## 2019-03-06 MED ORDER — SODIUM CHLORIDE 0.9 % IV SOLN: INTRAVENOUS | Status: DC

## 2019-03-06 MED ORDER — EPOETIN ALFA-EPBX 10000 UNIT/ML IJ SOLN
INTRAMUSCULAR | Status: AC
  Filled 2019-03-06: qty 2

## 2019-03-06 MED ORDER — SODIUM CHLORIDE 0.9 % IV SOLN
150.0000 mg | Freq: Once | INTRAVENOUS | Status: DC
  Filled 2019-03-06: qty 5

## 2019-03-06 NOTE — Progress Notes (Signed)

## 2019-03-06 NOTE — Assessment & Plan Note (Signed)
1.  T3N2C supraglottic squamous cell carcinoma: -Laryngoscopy and biopsy on 01/02/2019, invasive squamous cell carcinoma. -PET scan on 01/27/2019 showed hypermetabolic lesion in the left and posterior laryngeal region SUV 21.9.  Hypermetabolic lymphadenopathy in the neck bilaterally at level 2 and 3 positions. -A trach was placed because of the extent of tumor on 02/18/2019. -PICC line was placed on 02/21/2019.  PEG was placed on 02/24/2019.  He is not using the PEG tube at this time. -He had a bleeding from the tracheostomy on 03/04/2019.  I have reviewed CT of the neck from 03/04/2019. -Plavix was discontinued.  bleeding stopped. -Labs from 03/04/2019 showed creatinine of 0.93. -We reviewed labs from today which showed creatinine increased to 1.71.  He will receive 1 L of fluid with electrolytes followed by 1 L of normal saline. -We will recheck his creatinine tomorrow.  If the creatinine improves to less than 1.5, he may proceed with his weekly cisplatin. -We discussed about side effects of cisplatin regimen in detail.  He was instructed to drink at least 2 to 3 L of water every day.  2.  Acute renal insufficiency: -Creatinine today is 1.71, up from 0.93 on 03/04/2019. -I have reviewed all his medications.  He will be given IV fluids today. -I will hold lisinopril at this time.  3.  CVA: -He had pontine stroke and large vessel occlusion. -Plavix was held since the bleed from trachea on 03/04/2019.  He will continue aspirin 81 mg daily.

## 2019-03-06 NOTE — Progress Notes (Signed)
Patient has been assessed, vital signs and labs have been reviewed by Dr. Delton Coombes. ANC, Creatinine, LFTs, and Platelets are within treatment parameters per Dr. Delton Coombes. The patient is good to proceed with treatment at this time. Please be sure that he gets 1 liter of fluids with electrolytes pre and post treatment today per Dr. Delton Coombes.

## 2019-03-06 NOTE — Progress Notes (Signed)
I spoke with patient to notify him, per Dr. Delton Coombes, he needs to STOP taking his Lisinopril.  Patient verbalizes understanding.  He will follow up as scheduled tomorrow.

## 2019-03-06 NOTE — Progress Notes (Signed)
[name], [place] [id number]   CLINIC:  Medical Oncology/Hematology  PCP:  [name], [place] [address] [phone]   REASON FOR VISIT:  Follow-up for supraglottic squamous cell carcinoma.  CURRENT THERAPY: Weekly cisplatin.  BRIEF ONCOLOGIC HISTORY:  Oncology History  Malignant neoplasm of supraglottis ([place])  01/22/2019 Initial Diagnosis   Malignant neoplasm of supraglottis ([facility])   01/30/2019 Cancer Staging   Staging form: Larynx - Supraglottis, AJCC 8th Edition - Clinical: Stage IVA (cT2, cN2c, cM0) - Signed by [name], MD on 01/30/2019   03/07/2019 -  Chemotherapy   The patient had palonosetron (ALOXI) injection 0.25 mg, 0.25 mg, Intravenous,  Once, 1 of 7 cycles CISplatin (PLATINOL) 79 mg in sodium chloride 0.9 % 250 mL chemo infusion, 40 mg/m2 = 79 mg, Intravenous,  Once, 1 of 7 cycles fosaprepitant (EMEND) 150 mg in sodium chloride 0.9 % 145 mL IVPB, 150 mg, Intravenous,  Once, 1 of 7 cycles  for chemotherapy treatment.       CANCER STAGING: Cancer Staging Malignant neoplasm of supraglottis ([facility]) Staging form: Larynx - Supraglottis, AJCC 8th Edition - Clinical: Stage IVA (cT2, cN2c, cM0) - Signed by [name], MD on 01/30/2019    INTERVAL HISTORY:  [name] 59 y.o. male seen for follow-up of supraglottic squamous cell carcinoma.  When he went in for dental extractions, a prophylactic trach was placed based on the extent of the tumor on 02/18/2019.  He was hospitalized and a PICC line was placed on 02/21/2019 followed by a PEG tube on 02/24/2019.  He is not using the PEG tube at this time.  He is able to eat by mouth.  Denies any tingling or numbness in extremities.  He has some pain in the feet from gout.  Appetite and energy levels are 50%.  Chronic cough has been stable.  He had to go to the ER on 03/04/2019 for bleed from tracheostomy.  Right now bleeding has stopped.  His  Plavix is on hold.    REVIEW OF SYSTEMS:  Review of Systems  Respiratory: Positive for cough.   Musculoskeletal:       Pain in the feet from gout.  All other systems reviewed and are negative.    PAST MEDICAL/SURGICAL HISTORY:  Past Medical History:  Diagnosis Date  . Anxiety   . Arthritis    back  . Bradycardia    a. During 11/2012 adm: lopressor decreased.  . Cancer of larynx ([facility])   . Carotid artery stenosis   . 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 Coronary artery disease    a. Diag cath 10/2012 for CP/abnormal nuc -> planned PCI s/p LAD atherectomy/DES placement 11/05/12.  Marland Kitchen GERD (gastroesophageal reflux disease)   . History of hiatal hernia   . Hypercholesteremia   . Hypertension   . Stroke ([facility]) 07/31/2018   bilateral vertebrobasilar occlusion; "left side weaker thanit was before."   Past Surgical History:  Procedure Laterality Date  . CARDIAC CATHETERIZATION  10/29/2012  . CORONARY ANGIOPLASTY WITH STENT PLACEMENT  11/05/2012  . DIRECT LARYNGOSCOPY N/A 01/02/2019   Procedure: MICRO DIRECT LARYNGOSCOPY BIOPSY OF LARYNGEAL MASS;  Surgeon: [name], MD;  Location: [facility];  Service: ENT;  Laterality: N/A;  . HEMORRHOID SURGERY  ~ 2010  . INSERTION OF MESH N/A 06/02/2015   Procedure: INSERTION OF MESH;  Surgeon: [name], MD;  Location: [facility];  Service: General;  Laterality: N/A;  . IR ANGIO INTRA EXTRACRAN SEL COM CAROTID INNOMINATE BILAT MOD SED  08/02/2018  . IR ANGIO VERTEBRAL SEL VERTEBRAL BILAT MOD SED  08/02/2018  . LAPAROSCOPIC INSERTION GASTROSTOMY TUBE N/A 02/24/2019   Procedure: LAPAROSCOPIC INSERTION GASTROSTOMY TUBE;  Surgeon: [name], MD;  Location: [facility];  Service: General;  Laterality: N/A;  . LEFT HEART CATHETERIZATION WITH CORONARY ANGIOGRAM N/A 10/30/2012   Procedure: LEFT HEART CATHETERIZATION WITH CORONARY ANGIOGRAM;  Surgeon: [name], MD;  Location: [facility]  CATH LAB;  Service: Cardiovascular;  Laterality: N/A;  . MULTIPLE EXTRACTIONS WITH ALVEOLOPLASTY N/A 02/18/2019   Procedure: Extraction of tooth #'s 5-7, 10,11,14,20-29, 31 and 32 with alveoloplasty and maxiillary left buccal exostoses reductions;  Surgeon: [name], [name];  Location: [facility];  Service: Oral Surgery;  Laterality: N/A;  . PERCUTANEOUS CORONARY ROTOBLATOR INTERVENTION (PCI-R) N/A 11/05/2012   Procedure: PERCUTANEOUS CORONARY ROTOBLATOR INTERVENTION (PCI-R);  Surgeon: [name], MD;  Location: [facility] CATH LAB;  Service: Cardiovascular;  Laterality: N/A;  . TRACHEOSTOMY TUBE PLACEMENT N/A 02/18/2019   Procedure: Tracheostomy;  Surgeon: [name], MD;  Location: [facility];  Service: ENT;  Laterality: N/A;  . UMBILICAL HERNIA REPAIR N/A 06/02/2015   Procedure: UMBILICAL HERNIORRHAPHY WITH MESH;  Surgeon: [name], MD;  [facility];  Service: General;  Laterality: N/A;     SOCIAL HISTORY:  Social History   Socioeconomic History  . Marital status: Married    Spouse name: Not on file  . Number of children: 4  . Years of education: Not on file  . Highest education level: Not on file  Occupational History  . Occupation: Disabled  Tobacco Use  . Smoking status: Former Smoker    Packs/day: 3.00    Years: 32.00    Pack years: 96.00    Types: Cigarettes    Start date: 08/29/1977    Quit date: 11/29/2006    Years since quitting: 12.2  . Smokeless tobacco: Former Systems developer    Types: Chew  . Tobacco comment: 11/05/2012 "chewed tobacco when I play ball; aien't chewed since age 19"  Substance and Sexual Activity  . Alcohol use: Yes    Alcohol/week: 12.0 standard drinks    Types: 12 Cans of beer per week    Comment: Currently 3-4 beers per day. 02/13/19, was a heavy drinker in the past, - states he could drink a case a day   . Drug use: Yes    Types: Marijuana  . Sexual activity: Not Currently  Other Topics Concern  . Not on file  Social History Narrative   Patient is  disabled.  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   Patient quit smoking 10 to 12 years ago.  Patient previously 3 pack/day use.   Patient currently drinking 3-4 beers per day from 12 pack a day.   Patient denies use of chew or other illicit drugs.   Social Determinants of Health   Financial Resource Strain: Low Risk   . Difficulty of Paying Living Expenses: Not hard at all  Food Insecurity: No Food Insecurity  . Worried About Charity fundraiser in the Last Year: Never true  . Ran Out of Food in the Last Year: Never true  Transportation Needs: No Transportation Needs  . Lack of Transportation (Medical): No  . Lack of Transportation (Non-Medical): No  Physical Activity: Insufficiently Active  . Days of  Exercise per Week: 2 days  . Minutes of Exercise per Session: 30 min  Stress: Stress Concern Present  . Feeling of Stress : To some extent  Social Connections: Moderately Isolated  . Frequency of Communication with Friends and Family: Once a week  . Frequency of Social Gatherings with Friends and Family: Once a week  . Attends Religious Services: Never  . Active Member of Clubs or Organizations: No  . Attends Archivist Meetings: Never  . Marital Status: Married  Human resources officer Violence: Not At Risk  . Fear of Current or Ex-Partner: No  . Emotionally Abused: No  . Physically Abused: No  . Sexually Abused: No    FAMILY HISTORY:  Family History  Problem Relation Age of Onset  . Hypertension Mother   . Heart disease Father   . Hypertension Father   . Heart disease Sister   . Hypertension Maternal Grandmother   . Hypertension Maternal Grandfather   . Hypertension Paternal Grandmother   . Hypertension Paternal Grandfather     CURRENT MEDICATIONS:  Outpatient Encounter Medications as of 03/06/2019  Medication Sig Note  . amLODipine (NORVASC) 5 MG tablet Take 1 tablet (5 mg total) by mouth daily.   Marland Kitchen aspirin EC 81 MG tablet Take 81 mg by  mouth daily.   Marland Kitchen atorvastatin (LIPITOR) 40 MG tablet Take 40 mg by mouth daily.   Marland Kitchen atorvastatin (LIPITOR) 80 MG tablet Take 1 tablet (80 mg total) by mouth every evening.   . Catheters (ARGYLE SUCTION CATHETER 14FR) MISC 1 Device by Does not apply route daily.   Marland Kitchen CISPLATIN IV Inject into the vein once a week. Weekly in conjunction with radiation therapy beginning 03/06/2019   . DULoxetine (CYMBALTA) 30 MG capsule Take 30 mg by mouth daily.   Marland Kitchen lidocaine-prilocaine (EMLA) cream Apply 1 application topically as needed. Apply small amount to port site and cover with plastic wrap one hour before appointment.   . metoprolol tartrate (LOPRESSOR) 25 MG tablet TAKE 1/2 TABLET BY MOUTH TWICE DAILY (Patient taking differently: Take 12.5 mg by mouth 2 (two) times daily. )   . Misc. Devices MISC 10 mL saline flushes - one box   . MONOJECT PREFILL ADVANCED NACL 0.9 % SOLN injection USE AS DIRECTED.A   . naproxen (NAPROSYN) 250 MG tablet Take 250 mg by mouth 2 (two) times daily.   Marland Kitchen omeprazole (PRILOSEC) 20 MG capsule Take 20 mg by mouth daily.    . sodium chloride irrigation 0.9 % irrigation Irrigate with 200 mLs as directed continuous.   . Tracheostomy Care KIT 1 kit by Does not apply route daily.   . [DISCONTINUED] lisinopril (ZESTRIL) 20 MG tablet Take 1 tablet (20 mg total) by mouth daily. (Patient taking differently: Take 20 mg by mouth 2 (two) times daily. )   . clopidogrel (PLAVIX) 75 MG tablet Take 1 tablet (75 mg total) by mouth daily. (Patient not taking: Reported on 02/17/2019) 03/06/2019: Pt will start back taking in a couple weeks per pt  . HYDROcodone-acetaminophen (NORCO) 10-325 MG tablet Take 1 tablet by mouth every 4 (four) hours as needed. (Patient not taking: Reported on 03/06/2019)   . nitroGLYCERIN (NITROSTAT) 0.4 MG SL tablet Place 1 tablet (0.4 mg total) under the tongue every 5 (five) minutes as needed for chest pain (CP or SOB). (Patient not taking: Reported on 02/17/2019)   .  prochlorperazine (COMPAZINE) 10 MG tablet Take 1 tablet (10 mg total) by mouth every 6 (six) hours as needed (  Nausea or vomiting). (Patient not taking: Reported on 03/06/2019)   . traMADol (ULTRAM) 50 MG tablet Take 50-100 mg by mouth 4 (four) times daily as needed (pain.).     No facility-administered encounter medications on file as of 03/06/2019.    ALLERGIES:  Allergies  Allergen Reactions  . Prednisone Other (See Comments)    Chest pain     PHYSICAL EXAM:  ECOG Performance status: 1  There were no vitals filed for this visit. Filed Weights   03/06/19 0917  Weight: 171 lb 4.8 oz (77.7 kg)    Physical Exam Vitals reviewed.  Constitutional:      Appearance: Normal appearance.  Neck:     Comments: Tracheostomy site is not bleeding. Cardiovascular:     Rate and Rhythm: Normal rate and regular rhythm.     Heart sounds: Normal heart sounds.  Pulmonary:     Effort: Pulmonary effort is normal.     Breath sounds: Normal breath sounds.  Abdominal:     General: There is no distension.     Palpations: Abdomen is soft. There is no mass.     Comments: PEG site is within normal limits.  Musculoskeletal:        General: No swelling.  Skin:    General: Skin is warm.  Neurological:     General: No focal deficit present.     Mental Status: He is alert and oriented to person, place, and time.  Psychiatric:        Mood and Affect: Mood normal.        Behavior: Behavior normal.      LABORATORY DATA:  I have reviewed the labs as listed.  CBC    Component Value Date/Time   WBC 10.5 03/06/2019 0909   RBC 3.06 (L) 03/06/2019 0909   HGB 9.6 (L) 03/06/2019 0909   HCT 30.2 (L) 03/06/2019 0909   PLT 465 (H) 03/06/2019 0909   MCV 98.7 03/06/2019 0909   MCH 31.4 03/06/2019 0909   MCHC 31.8 03/06/2019 0909   RDW 13.0 03/06/2019 0909   LYMPHSABS 2.0 03/06/2019 0909   MONOABS 1.2 (H) 03/06/2019 0909   EOSABS 0.2 03/06/2019 0909   BASOSABS 0.1 03/06/2019 0909   CMP Latest Ref Rng  & Units 03/06/2019 03/04/2019 02/25/2019  Glucose 70 - 99 mg/dL 106(H) 131(H) 117(H)  BUN 6 - 20 mg/dL '18 11 8  '$ Creatinine 0.61 - 1.24 mg/dL 1.71(H) 0.93 0.86  Sodium 135 - 145 mmol/L 132(L) 136 134(L)  Potassium 3.5 - 5.1 mmol/L 3.4(L) 3.7 4.0  Chloride 98 - 111 mmol/L 100 102 97(L)  CO2 22 - 32 mmol/L '23 25 27  '$ Calcium 8.9 - 10.3 mg/dL 8.5(L) 9.1 8.6(L)  Total Protein 6.5 - 8.1 g/dL 6.8 7.4 -  Total Bilirubin 0.3 - 1.2 mg/dL 0.4 0.5 -  Alkaline Phos 38 - 126 U/L 52 55 -  AST 15 - 41 U/L 18 19 -  ALT 0 - 44 U/L 21 23 -       DIAGNOSTIC IMAGING:  I have independently reviewed the scans and discussed with the patient.     ASSESSMENT & PLAN:   Malignant neoplasm of supraglottis ([place]) 1.  T3N2C supraglottic squamous cell carcinoma: -Laryngoscopy and biopsy on 01/02/2019, invasive squamous cell carcinoma. -PET scan on 01/27/2019 showed hypermetabolic lesion in the left and posterior laryngeal region SUV 21.9.  Hypermetabolic lymphadenopathy in the neck bilaterally at level 2 and 3 positions. -A trach was placed because of the extent of tumor  on 02/18/2019. -PICC line was placed on 02/21/2019.  PEG was placed on 02/24/2019.  He is not using the PEG tube at this time. -He had a bleeding from the tracheostomy on 03/04/2019.  I have reviewed CT of the neck from 03/04/2019. -Plavix was discontinued.  bleeding stopped. -Labs from 03/04/2019 showed creatinine of 0.93. -We reviewed labs from today which showed creatinine increased to 1.71.  He will receive 1 L of fluid with electrolytes followed by 1 L of normal saline. -We will recheck his creatinine tomorrow.  If the creatinine improves to less than 1.5, he may proceed with his weekly cisplatin. -We discussed about side effects of cisplatin regimen in detail.  He was instructed to drink at least 2 to 3 L of water every day.  2.  Acute renal insufficiency: -Creatinine today is 1.71, up from 0.93 on 03/04/2019. -I have reviewed all his medications.  He  will be given IV fluids today. -I will hold lisinopril at this time.  3.  CVA: -He had pontine stroke and large vessel occlusion. -Plavix was held since the bleed from trachea on 03/04/2019.  He will continue aspirin 81 mg daily.      Orders placed this encounter:  Orders Placed This Encounter  Procedures  . CBC with Differential/Platelet  . Comprehensive metabolic panel  . Magnesium   Total time spent is 40 minutes with more than 50% of the time spent face-to-face discussing chemotherapy regimen, side effects, reviewing records, counseling and coordination of care.   [name], MD [place] [phone]

## 2019-03-06 NOTE — Progress Notes (Signed)
No treatment today per MD. Will give hydration fluids only today, labs and possible treatment tomorrow.   Fluids given today as ordered. Vitals stable and discharged home from clinic ambulatory. Follow up as scheduled.

## 2019-03-06 NOTE — Patient Instructions (Addendum)
[place] at [facility] Discharge Instructions  You were seen today by Dr. Delton Coombes. He went over your recent test results. He discussed your treatment and it's side effects. Be sure to drink lots of water to help flush your kidneys. He will see you back in 1 week for labs, treatment and follow up.   Thank you for choosing [place] at [facility] to provide your oncology and hematology care.  To afford each patient quality time with our provider, please arrive at least 15 minutes before your scheduled appointment time.   If you have a lab appointment with the [facility] please come in thru the  Main Entrance and check in at the main information desk  You need to re-schedule your appointment should you arrive 10 or more minutes late.  We strive to give you quality time with our providers, and arriving late affects you and other patients whose appointments are after yours.  Also, if you no show three or more times for appointments you may be dismissed from the clinic at the providers discretion.     Again, thank you for choosing [facility].  Our hope is that these requests will decrease the amount of time that you wait before being seen by our physicians.       _____________________________________________________________  Should you have questions after your visit to [facility], please contact our office at [phone] between the hours of 8:00 a.m. and 4:30 p.m.  Voicemails left after 4:00 p.m. will not be returned until the following business day.  For prescription refill requests, have your pharmacy contact our office and allow 72 hours.    Cancer Center Support Programs:   > Cancer Support Group  2nd Tuesday of the month 1pm-2pm, Journey Room

## 2019-03-07 ENCOUNTER — Other Ambulatory Visit: Payer: Self-pay

## 2019-03-07 ENCOUNTER — Ambulatory Visit
Admission: RE | Admit: 2019-03-07 | Discharge: 2019-03-07 | Disposition: A | Discharge status: Home / Self Care | Payer: 59 | Source: Ambulatory Visit | Attending: Radiation Oncology | Admitting: Radiation Oncology

## 2019-03-07 ENCOUNTER — Other Ambulatory Visit (HOSPITAL_COMMUNITY): Payer: 59

## 2019-03-07 ENCOUNTER — Encounter (HOSPITAL_COMMUNITY): Payer: Self-pay

## 2019-03-07 ENCOUNTER — Ambulatory Visit (HOSPITAL_COMMUNITY): Payer: 59

## 2019-03-07 ENCOUNTER — Inpatient Hospital Stay (HOSPITAL_COMMUNITY): Payer: 59

## 2019-03-07 VITALS — BP 165/81 | HR 79 | Temp 97.0°F | Resp 16

## 2019-03-07 DIAGNOSIS — C321 Malignant neoplasm of supraglottis: Secondary | ICD-10-CM

## 2019-03-07 DIAGNOSIS — Z5111 Encounter for antineoplastic chemotherapy: Secondary | ICD-10-CM | POA: Diagnosis not present

## 2019-03-07 LAB — BASIC METABOLIC PANEL
Anion gap: 10 (ref 5–15)
BUN: 9 mg/dL (ref 6–20)
CO2: 23 mmol/L (ref 22–32)
Calcium: 8.8 mg/dL — ABNORMAL LOW (ref 8.9–10.3)
Chloride: 104 mmol/L (ref 98–111)
Creatinine, Ser: 1.05 mg/dL (ref 0.61–1.24)
GFR calc Af Amer: >60 mL/min (ref >60)
GFR calc non Af Amer: >60 mL/min (ref >60)
Glucose, Bld: 133 mg/dL — ABNORMAL HIGH (ref 70–99)
Potassium: 3.5 mmol/L (ref 3.5–5.1)
Sodium: 137 mmol/L (ref 135–145)

## 2019-03-07 MED ORDER — HEPARIN SOD (PORK) LOCK FLUSH 100 UNIT/ML IV SOLN
250.0000 [IU] | Freq: Once | INTRAVENOUS | Status: AC | PRN
  Administered 2019-03-07: 14:00:00 250 [IU]

## 2019-03-07 MED ORDER — SODIUM CHLORIDE 0.9 % IV SOLN
40.0000 mg/m2 | Freq: Once | INTRAVENOUS | Status: AC
  Administered 2019-03-07: 12:00:00 79 mg via INTRAVENOUS
  Filled 2019-03-07: qty 79

## 2019-03-07 MED ORDER — POTASSIUM CHLORIDE 2 MEQ/ML IV SOLN
Freq: Once | INTRAVENOUS | Status: AC
  Filled 2019-03-07: qty 10

## 2019-03-07 MED ORDER — SODIUM CHLORIDE 0.9% FLUSH
10.0000 mL | INTRAVENOUS | Status: DC | PRN
  Administered 2019-03-07: 09:00:00 10 mL

## 2019-03-07 MED ORDER — SODIUM CHLORIDE 0.9 % IV SOLN: Freq: Once | INTRAVENOUS | Status: AC

## 2019-03-07 MED ORDER — PALONOSETRON HCL INJECTION 0.25 MG/5ML
0.2500 mg | Freq: Once | INTRAVENOUS | Status: AC
  Administered 2019-03-07: 11:00:00 0.25 mg via INTRAVENOUS
  Filled 2019-03-07: qty 5

## 2019-03-07 MED ORDER — SODIUM CHLORIDE 0.9 % IV SOLN
150.0000 mg | Freq: Once | INTRAVENOUS | Status: AC
  Administered 2019-03-07: 09:00:00 150 mg via INTRAVENOUS
  Filled 2019-03-07: qty 150

## 2019-03-07 MED ORDER — SODIUM CHLORIDE 0.9 % IV SOLN: INTRAVENOUS | Status: DC

## 2019-03-07 MED ORDER — SODIUM CHLORIDE 0.9 % IV SOLN
10.0000 mg | Freq: Once | INTRAVENOUS | Status: AC
  Administered 2019-03-07: 10:00:00 10 mg via INTRAVENOUS
  Filled 2019-03-07: qty 10

## 2019-03-07 MED ORDER — SONAFINE EX EMUL
1.0000 "application " | Freq: Once | CUTANEOUS | Status: AC
  Administered 2019-03-07: 1 via TOPICAL

## 2019-03-07 NOTE — Progress Notes (Signed)
Nutrition Follow-up:  Referral from RN, Barnetta Chapel.    Chart reviewed.  PEG placed on 2/22.    Met with patient briefly during infusion to meet face to face.  Spoke by phone on 02/07/19.   Briefly discussed RD role during treatment.   Patient flushing tube BID.   Next visit:  March 10 after radiation. Reminded patient of appointment next week and gave directions to RD office.    Yuritzi Kamp B. [name], [place], [place] Registered Dietitian [phone] (pager)

## 2019-03-07 NOTE — Progress Notes (Signed)
Creatinine 1.05 today. Ok to treat per Dr. Tomie China note from 03-06-2019.  Treatment given per orders. Patient tolerated it well without problems. Vitals stable and discharged home from clinic ambulatory. Follow up as scheduled.

## 2019-03-07 NOTE — Progress Notes (Signed)

## 2019-03-07 NOTE — Patient Instructions (Signed)
[facility] Discharge Instructions for Patients Receiving Chemotherapy  Today you received the following chemotherapy agents   To help prevent nausea and vomiting after your treatment, we encourage you to take your nausea medication   If you develop nausea and vomiting that is not controlled by your nausea medication, call the clinic.   BELOW ARE SYMPTOMS THAT SHOULD BE REPORTED IMMEDIATELY:  *FEVER GREATER THAN 100.5 F  *CHILLS WITH OR WITHOUT FEVER  NAUSEA AND VOMITING THAT IS NOT CONTROLLED WITH YOUR NAUSEA MEDICATION  *UNUSUAL SHORTNESS OF BREATH  *UNUSUAL BRUISING OR BLEEDING  TENDERNESS IN MOUTH AND THROAT WITH OR WITHOUT PRESENCE OF ULCERS  *URINARY PROBLEMS  *BOWEL PROBLEMS  UNUSUAL RASH Items with * indicate a potential emergency and should be followed up as soon as possible.  Feel free to call the clinic should you have any questions or concerns. The clinic phone number is [phone].  Please show the CHEMO ALERT CARD at check-in to the Emergency Department and triage nurse.   

## 2019-03-07 NOTE — Patient Instructions (Signed)
[place] at [facility] Discharge Instructions  Labs drawn from PICC line today   Thank you for choosing [place] at [facility] to provide your oncology and hematology care.  To afford each patient quality time with our provider, please arrive at least 15 minutes before your scheduled appointment time.   If you have a lab appointment with the [facility] please come in thru the Main Entrance and check in at the main information desk.  You need to re-schedule your appointment should you arrive 10 or more minutes late.  We strive to give you quality time with our providers, and arriving late affects you and other patients whose appointments are after yours.  Also, if you no show three or more times for appointments you may be dismissed from the clinic at the providers discretion.     Again, thank you for choosing [facility].  Our hope is that these requests will decrease the amount of time that you wait before being seen by our physicians.       _____________________________________________________________  Should you have questions after your visit to [facility], please contact our office at [phone] between the hours of 8:00 a.m. and 4:30 p.m.  Voicemails left after 4:00 p.m. will not be returned until the following business day.  For prescription refill requests, have your pharmacy contact our office and allow 72 hours.    Due to Covid, you will need to wear a mask upon entering the hospital. If you do not have a mask, a mask will be given to you at the Main Entrance upon arrival. For doctor visits, patients may have 1 support person with them. For treatment visits, patients can not have anyone with them due to social distancing guidelines and our immunocompromised population.

## 2019-03-10 ENCOUNTER — Telehealth (HOSPITAL_COMMUNITY): Payer: Self-pay

## 2019-03-10 ENCOUNTER — Other Ambulatory Visit: Payer: Self-pay

## 2019-03-10 ENCOUNTER — Other Ambulatory Visit: Payer: Self-pay | Admitting: Radiation Oncology

## 2019-03-10 ENCOUNTER — Ambulatory Visit
Admission: RE | Admit: 2019-03-10 | Discharge: 2019-03-10 | Disposition: A | Discharge status: Home / Self Care | Payer: 59 | Source: Ambulatory Visit | Attending: Radiation Oncology | Admitting: Radiation Oncology

## 2019-03-10 DIAGNOSIS — Z5111 Encounter for antineoplastic chemotherapy: Secondary | ICD-10-CM | POA: Diagnosis not present

## 2019-03-10 DIAGNOSIS — C321 Malignant neoplasm of supraglottis: Secondary | ICD-10-CM

## 2019-03-10 MED ORDER — LIDOCAINE VISCOUS HCL 2 % MT SOLN: OROMUCOSAL | 4 refills | Status: DC

## 2019-03-10 NOTE — Telephone Encounter (Signed)
24 hour follow up -spoke to wife today, she said he is doing well, had some constipation and diarrhea, no nausea. Has had some trouble with bleeding again with his trach but was able to get it stopped. They think its due to his humidified trach collar he uses at night. Encouraged them to let radiation know, that this is probably irritation from radiation making him cough which could cause some bleeding. Instructed him to call if they need anything.

## 2019-03-11 ENCOUNTER — Ambulatory Visit (HOSPITAL_COMMUNITY): Payer: 59 | Admitting: Hematology

## 2019-03-11 ENCOUNTER — Other Ambulatory Visit: Payer: Self-pay

## 2019-03-11 ENCOUNTER — Ambulatory Visit (HOSPITAL_COMMUNITY): Payer: 59

## 2019-03-11 ENCOUNTER — Telehealth: Payer: Self-pay | Admitting: *Deleted

## 2019-03-11 ENCOUNTER — Ambulatory Visit
Admission: RE | Admit: 2019-03-11 | Discharge: 2019-03-11 | Disposition: A | Discharge status: Home / Self Care | Payer: 59 | Source: Ambulatory Visit | Attending: Radiation Oncology | Admitting: Radiation Oncology

## 2019-03-11 ENCOUNTER — Other Ambulatory Visit (HOSPITAL_COMMUNITY): Payer: 59

## 2019-03-11 DIAGNOSIS — Z5111 Encounter for antineoplastic chemotherapy: Secondary | ICD-10-CM | POA: Diagnosis not present

## 2019-03-11 NOTE — Telephone Encounter (Signed)
Oncology Nurse Navigator Documentation  Rec'd call from patient's wife, provided clarification re this week's appts and future Thursday RT and chemo schedule.  [name], RN, BSN Head & Neck Oncology Nurse [place] at [facility] [phone]

## 2019-03-12 ENCOUNTER — Other Ambulatory Visit: Payer: Self-pay

## 2019-03-12 ENCOUNTER — Telehealth (HOSPITAL_COMMUNITY): Payer: Self-pay | Admitting: Physical Therapy

## 2019-03-12 ENCOUNTER — Ambulatory Visit
Admission: RE | Admit: 2019-03-12 | Discharge: 2019-03-12 | Disposition: A | Discharge status: Home / Self Care | Payer: 59 | Source: Ambulatory Visit | Attending: Radiation Oncology | Admitting: Radiation Oncology

## 2019-03-12 ENCOUNTER — Inpatient Hospital Stay: Payer: 59 | Attending: Radiation Oncology

## 2019-03-12 DIAGNOSIS — Z5111 Encounter for antineoplastic chemotherapy: Secondary | ICD-10-CM | POA: Diagnosis not present

## 2019-03-12 NOTE — Progress Notes (Signed)
Nutrition Follow-up:  Patient with supraglottic carcinoma. Hospital admission with dental extractions, trach and PEG placement (2/22).  Patient receiving concurrent chemotherapy and radiation therapy.    Met with patient following radiation.  Patient reports continued good appetite.  Has trouble chewing as no teeth.  Reports that he is still able to eat meats (chops fine).  Last night was about to eat pork chop, macaroni and cheese, mashed potatoes.  Lunch yesterday was boost plus and devil foods cake. Breakfast was 3 eggs, gravy and biscuit.  Wife also prepared strawberry cheesecake that he is eating and ice cream.     Flushing PEG tube daily with water.     Having bowel movement daily or every other day.   Medications: reviewed  Labs: reviewed  Anthropometrics:   Weight 172 lb on 3/8 per [facility]  176 lb on 2/4 UBW 175-180 lb per patient   NUTRITION DIAGNOSIS: Predicted suboptimal energy intake continues    INTERVENTION:  Reviewed soft moist protein foods.  Encouraged chopping, grinding, blending foods if needed.  Patient reports that he has a blender at home.  Patient to continue to drink boost plus BID Continue flushing PEG daily Patient has contact information    MONITORING, EVALUATION, GOAL: Patient will consume adequate calories and protein during treatment to maintain weight  NEXT VISIT: Wednesday, March 17th after radiation  [name] B. [name], [place], [place] Registered Dietitian [phone] (pager)

## 2019-03-12 NOTE — Telephone Encounter (Signed)
S/w  Wife she states pt is getting PT at home and understands they will need a new referral if PT or ST is needed at our office.

## 2019-03-13 ENCOUNTER — Inpatient Hospital Stay (HOSPITAL_BASED_OUTPATIENT_CLINIC_OR_DEPARTMENT_OTHER): Payer: 59 | Admitting: Hematology

## 2019-03-13 ENCOUNTER — Encounter (HOSPITAL_COMMUNITY): Payer: Self-pay | Admitting: Hematology

## 2019-03-13 ENCOUNTER — Inpatient Hospital Stay (HOSPITAL_COMMUNITY): Payer: 59

## 2019-03-13 ENCOUNTER — Ambulatory Visit
Admission: RE | Admit: 2019-03-13 | Discharge: 2019-03-13 | Disposition: A | Discharge status: Home / Self Care | Payer: 59 | Source: Ambulatory Visit | Attending: Radiation Oncology | Admitting: Radiation Oncology

## 2019-03-13 ENCOUNTER — Other Ambulatory Visit: Payer: Self-pay

## 2019-03-13 VITALS — BP 149/71 | HR 67 | Temp 97.7°F | Resp 18 | Wt 168.3 lb

## 2019-03-13 DIAGNOSIS — C321 Malignant neoplasm of supraglottis: Secondary | ICD-10-CM

## 2019-03-13 DIAGNOSIS — Z5111 Encounter for antineoplastic chemotherapy: Secondary | ICD-10-CM | POA: Diagnosis not present

## 2019-03-13 DIAGNOSIS — C329 Malignant neoplasm of larynx, unspecified: Secondary | ICD-10-CM

## 2019-03-13 LAB — CBC WITH DIFFERENTIAL/PLATELET
Abs Immature Granulocytes: 0.09 10*3/uL — ABNORMAL HIGH (ref 0.00–0.07)
Basophils Absolute: 0 10*3/uL (ref 0.0–0.1)
Basophils Relative: 0 %
Eosinophils Absolute: 0.1 10*3/uL (ref 0.0–0.5)
Eosinophils Relative: 2 %
HCT: 29.6 % — ABNORMAL LOW (ref 39.0–52.0)
Hemoglobin: 9.7 g/dL — ABNORMAL LOW (ref 13.0–17.0)
Immature Granulocytes: 1 %
Lymphocytes Relative: 17 %
Lymphs Abs: 1.1 10*3/uL (ref 0.7–4.0)
MCH: 31.4 pg (ref 26.0–34.0)
MCHC: 32.8 g/dL (ref 30.0–36.0)
MCV: 95.8 fL (ref 80.0–100.0)
Monocytes Absolute: 0.9 10*3/uL (ref 0.1–1.0)
Monocytes Relative: 14 %
Neutro Abs: 4.4 10*3/uL (ref 1.7–7.7)
Neutrophils Relative %: 66 %
Platelets: 363 10*3/uL (ref 150–400)
RBC: 3.09 MIL/uL — ABNORMAL LOW (ref 4.22–5.81)
RDW: 12.5 % (ref 11.5–15.5)
WBC: 6.6 10*3/uL (ref 4.0–10.5)
nRBC: 0 % (ref 0.0–0.2)

## 2019-03-13 LAB — COMPREHENSIVE METABOLIC PANEL
ALT: 26 U/L (ref 0–44)
AST: 21 U/L (ref 15–41)
Albumin: 3.6 g/dL (ref 3.5–5.0)
Alkaline Phosphatase: 61 U/L (ref 38–126)
Anion gap: 10 (ref 5–15)
BUN: 11 mg/dL (ref 6–20)
CO2: 25 mmol/L (ref 22–32)
Calcium: 9 mg/dL (ref 8.9–10.3)
Chloride: 98 mmol/L (ref 98–111)
Creatinine, Ser: 0.88 mg/dL (ref 0.61–1.24)
GFR calc Af Amer: >60 mL/min (ref >60)
GFR calc non Af Amer: >60 mL/min (ref >60)
Glucose, Bld: 109 mg/dL — ABNORMAL HIGH (ref 70–99)
Potassium: 3.9 mmol/L (ref 3.5–5.1)
Sodium: 133 mmol/L — ABNORMAL LOW (ref 135–145)
Total Bilirubin: 0.8 mg/dL (ref 0.3–1.2)
Total Protein: 7.2 g/dL (ref 6.5–8.1)

## 2019-03-13 LAB — MAGNESIUM: Magnesium: 2 mg/dL (ref 1.7–2.4)

## 2019-03-13 MED ORDER — PALONOSETRON HCL INJECTION 0.25 MG/5ML
0.2500 mg | Freq: Once | INTRAVENOUS | Status: AC
  Administered 2019-03-13: 12:00:00 0.25 mg via INTRAVENOUS
  Filled 2019-03-13: qty 5

## 2019-03-13 MED ORDER — SODIUM CHLORIDE 0.9% FLUSH
10.0000 mL | INTRAVENOUS | Status: DC | PRN
  Administered 2019-03-13: 10 mL

## 2019-03-13 MED ORDER — SODIUM CHLORIDE 0.9 % IV SOLN: INTRAVENOUS | Status: DC

## 2019-03-13 MED ORDER — SODIUM CHLORIDE 0.9 % IV SOLN
40.0000 mg/m2 | Freq: Once | INTRAVENOUS | Status: AC
  Administered 2019-03-13: 12:00:00 79 mg via INTRAVENOUS
  Filled 2019-03-13: qty 79

## 2019-03-13 MED ORDER — HEPARIN SOD (PORK) LOCK FLUSH 100 UNIT/ML IV SOLN
500.0000 [IU] | Freq: Once | INTRAVENOUS | Status: AC | PRN
  Administered 2019-03-13: 500 [IU]

## 2019-03-13 MED ORDER — SODIUM CHLORIDE 0.9 % IV SOLN
10.0000 mg | Freq: Once | INTRAVENOUS | Status: AC
  Administered 2019-03-13: 11:00:00 10 mg via INTRAVENOUS
  Filled 2019-03-13: qty 10

## 2019-03-13 MED ORDER — SODIUM CHLORIDE 0.9 % IV SOLN
150.0000 mg | Freq: Once | INTRAVENOUS | Status: AC
  Administered 2019-03-13: 150 mg via INTRAVENOUS
  Filled 2019-03-13: qty 150

## 2019-03-13 MED ORDER — POTASSIUM CHLORIDE 2 MEQ/ML IV SOLN
INTRAVENOUS | Status: AC
  Filled 2019-03-13 (×2): qty 10

## 2019-03-13 MED ORDER — SODIUM CHLORIDE 0.9 % IV SOLN: Freq: Once | INTRAVENOUS | Status: AC

## 2019-03-13 NOTE — Assessment & Plan Note (Signed)
1.  T3N2C supraglottic squamous cell carcinoma: -Laryngoscopy and biopsy on 01/02/2019, invasive squamous cell carcinoma. -PET scan on 01/27/2019 showed hypermetabolic lesion in the left and posterior laryngeal region SUV 21.9.  Hypermetabolic lymphadenopathy in the neck bilaterally at level 2 and 3 positions. -Lurline Idol was placed because of the extent of tumor on 02/18/2019. -Cycle 1 of weekly cisplatin on 03/07/2019. -He did not experience any major GI side effects from first cycle. -We reviewed his labs.  Creatinine is 0.8.  White count and platelet count is adequate.  LFTs are normal.  He will proceed with his cycle 2 today. -He will come back for post chemo hydration tomorrow.  I will reevaluate him in 1 week.  2.  Nutrition: -He lost couple of pounds since last week. -He has a PEG tube placed on 02/24/2019.  He is not using it for nutritional purposes.  He is eating by mouth without any problems.  3.  Renal insufficiency: -His creatinine went up to 1.71 last week.  This has improved after fluids.  Today it is 0.88. -He was encouraged to drink 3 L of water daily.  4.  CVA: -He had pontine stroke and large vessel occlusion. -He had bleeding from tracheal site and Plavix was held since 03/04/2019.  He is continuing aspirin 81 mg daily.

## 2019-03-13 NOTE — Progress Notes (Signed)
[address], [place] [address]   CLINIC:  Medical Oncology/Hematology  PCP:  [name], [place] [address] [phone]   REASON FOR VISIT:  Follow-up for supraglottic squamous cell carcinoma.  CURRENT THERAPY: Weekly cisplatin.  BRIEF ONCOLOGIC HISTORY:  Oncology History  Malignant neoplasm of supraglottis ([facility])  01/22/2019 Initial Diagnosis   Malignant neoplasm of supraglottis ([facility])   01/30/2019 Cancer Staging   Staging form: Larynx - Supraglottis, AJCC 8th Edition - Clinical: Stage IVA (cT2, cN2c, cM0) - Signed by [name], MD on 01/30/2019   03/07/2019 -  Chemotherapy   The patient had palonosetron (ALOXI) injection 0.25 mg, 0.25 mg, Intravenous,  Once, 2 of 7 cycles Administration: 0.25 mg (03/13/2019), 0.25 mg (03/07/2019) CISplatin (PLATINOL) 79 mg in sodium chloride 0.9 % 250 mL chemo infusion, 40 mg/m2 = 79 mg, Intravenous,  Once, 2 of 7 cycles Administration: 79 mg (03/13/2019), 79 mg (03/07/2019) fosaprepitant (EMEND) 150 mg in sodium chloride 0.9 % 145 mL IVPB, 150 mg, Intravenous,  Once, 2 of 7 cycles Administration: 150 mg (03/13/2019), 150 mg (03/07/2019)  for chemotherapy treatment.       CANCER STAGING: Cancer Staging Malignant neoplasm of supraglottis ([facility]) Staging form: Larynx - Supraglottis, AJCC 8th Edition - Clinical: Stage IVA (cT2, cN2c, cM0) - Signed by [name], MD on 01/30/2019    INTERVAL HISTORY:  [name] 59 y.o. male seen for follow-up of supraglottic squamous cell carcinoma.  Appetite and energy levels are 100%.  Chronic cough is stable.  No pains reported.  Denied any nausea, vomiting, diarrhea or constipation after last cycle last week.  His creatinine improved after IV hydration.  He has some sleep issues from coughing.  Denies any ringing in the ears.  Denies any tingling or numbness in the extremities.    REVIEW OF SYSTEMS:  Review of Systems    Respiratory: Positive for cough.   Psychiatric/Behavioral: Positive for sleep disturbance.  All other systems reviewed and are negative.    PAST MEDICAL/SURGICAL HISTORY:  Past Medical History:  Diagnosis Date  . Anxiety   . Arthritis    back  . Bradycardia    a. During 11/2012 adm: lopressor decreased.  . Cancer of larynx ([facility])   . Carotid artery stenosis   . 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 Coronary artery disease    a. Diag cath 10/2012 for CP/abnormal nuc -> planned PCI s/p LAD atherectomy/DES placement 11/05/12.  Marland Kitchen GERD (gastroesophageal reflux disease)   . History of hiatal hernia   . Hypercholesteremia   . Hypertension   . Stroke ([facility]) 07/31/2018   bilateral vertebrobasilar occlusion; "left side weaker thanit was before."   Past Surgical History:  Procedure Laterality Date  . CARDIAC CATHETERIZATION  10/29/2012  . CORONARY ANGIOPLASTY WITH STENT PLACEMENT  11/05/2012  . DIRECT LARYNGOSCOPY N/A 01/02/2019   Procedure: MICRO DIRECT LARYNGOSCOPY BIOPSY OF LARYNGEAL MASS;  Surgeon: [name], MD;  Location: [facility];  Service: ENT;  Laterality: N/A;  . HEMORRHOID SURGERY  ~ 2010  . INSERTION OF MESH N/A 06/02/2015   Procedure: INSERTION OF MESH;  Surgeon: [name], MD;  Location: [facility];  Service: General;  Laterality: N/A;  . IR ANGIO INTRA EXTRACRAN SEL COM CAROTID INNOMINATE BILAT MOD SED  08/02/2018  . IR ANGIO VERTEBRAL SEL VERTEBRAL BILAT MOD SED  08/02/2018  .  LAPAROSCOPIC INSERTION GASTROSTOMY TUBE N/A 02/24/2019   Procedure: LAPAROSCOPIC INSERTION GASTROSTOMY TUBE;  Surgeon: [name], MD;  Location: [facility];  Service: General;  Laterality: N/A;  . LEFT HEART CATHETERIZATION WITH CORONARY ANGIOGRAM N/A 10/30/2012   Procedure: LEFT HEART CATHETERIZATION WITH CORONARY ANGIOGRAM;  Surgeon: [name], MD;  Location: [facility];  Service: Cardiovascular;  Laterality: N/A;  .  MULTIPLE EXTRACTIONS WITH ALVEOLOPLASTY N/A 02/18/2019   Procedure: Extraction of tooth #'s 5-7, 10,11,14,20-29, 31 and 32 with alveoloplasty and maxiillary left buccal exostoses reductions;  Surgeon: [name], [name];  Location: [facility];  Service: Oral Surgery;  Laterality: N/A;  . PERCUTANEOUS CORONARY ROTOBLATOR INTERVENTION (PCI-R) N/A 11/05/2012   Procedure: PERCUTANEOUS CORONARY ROTOBLATOR INTERVENTION (PCI-R);  Surgeon: [name], MD;  Location[facility];  Service: Cardiovascular;  Laterality: N/A;  . TRACHEOSTOMY TUBE PLACEMENT N/A 02/18/2019   Procedure: Tracheostomy;  Surgeon: [name], MD;  Location: [facility];  Service: ENT;  Laterality: N/A;  . UMBILICAL HERNIA REPAIR N/A 06/02/2015   Procedure: UMBILICAL HERNIORRHAPHY WITH MESH;  Surgeon: [name], MD;  Location[facility];  Service: General;  Laterality: N/A;     SOCIAL HISTORY:  Social History   Socioeconomic History  . Marital status: Married    Spouse name: Not on file  . Number of children: 4  . Years of education: Not on file  . Highest education level: Not on file  Occupational History  . Occupation: Disabled  Tobacco Use  . Smoking status: Former Smoker    Packs/day: 3.00    Years: 32.00    Pack years: 96.00    Types: Cigarettes    Start date: 08/29/1977    Quit date: 11/29/2006    Years since quitting: 12.2  . Smokeless tobacco: Former Systems developer    Types: Chew  . Tobacco comment: 11/05/2012 "chewed tobacco when I play ball; aien't chewed since age 30"  Substance and Sexual Activity  . Alcohol use: Yes    Alcohol/week: 12.0 standard drinks    Types: 12 Cans of beer per week    Comment: Currently 3-4 beers per day. 02/13/19, was a heavy drinker in the past, - states he could drink a case a day   . Drug use: Yes    Types: Marijuana  . Sexual activity: Not Currently  Other Topics Concern  . Not on file  Social History Narrative   Patient is disabled.  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   Patient quit smoking 10 to 12 years ago.  Patient previously 3 pack/day use.   Patient currently drinking 3-4 beers per day from 12 pack a day.   Patient denies use of chew or other illicit drugs.   Social Determinants of Health   Financial Resource Strain: Low Risk   . Difficulty of Paying Living Expenses: Not hard at all  Food Insecurity: No Food Insecurity  . Worried About Charity fundraiser in the Last Year: Never true  . Ran Out of Food in the Last Year: Never true  Transportation Needs: No Transportation Needs  . Lack of Transportation (Medical): No  . Lack of Transportation (Non-Medical): No  Physical Activity: Insufficiently Active  . Days of Exercise per Week: 2 days  . Minutes of Exercise per Session: 30 min  Stress: Stress Concern Present  . Feeling of Stress : To some extent  Social Connections: Moderately Isolated  . Frequency of Communication with Friends and Family: Once  a week  . Frequency of Social Gatherings with Friends and Family: Once a week  . Attends Religious Services: Never  . Active Member of Clubs or Organizations: No  . Attends Archivist Meetings: Never  . Marital Status: Married  Human resources officer Violence: Not At Risk  . Fear of Current or Ex-Partner: No  . Emotionally Abused: No  . Physically Abused: No  . Sexually Abused: No    FAMILY HISTORY:  Family History  Problem Relation Age of Onset  . Hypertension Mother   . Heart disease Father   . Hypertension Father   . Heart disease Sister   . Hypertension Maternal Grandmother   . Hypertension Maternal Grandfather   . Hypertension Paternal Grandmother   . Hypertension Paternal Grandfather     CURRENT MEDICATIONS:  Outpatient Encounter Medications as of 03/13/2019  Medication Sig Note  . amLODipine (NORVASC) 5 MG tablet Take 1 tablet (5 mg total) by mouth daily.   Marland Kitchen aspirin EC 81 MG tablet Take 81 mg by mouth daily.   Marland Kitchen atorvastatin (LIPITOR) 80 MG tablet Take 1  tablet (80 mg total) by mouth every evening.   . Catheters (ARGYLE SUCTION CATHETER 14FR) MISC 1 Device by Does not apply route daily.   Marland Kitchen CISPLATIN IV Inject into the vein once a week. Weekly in conjunction with radiation therapy beginning 03/06/2019   . DULoxetine (CYMBALTA) 30 MG capsule Take 30 mg by mouth daily.   Marland Kitchen lidocaine (XYLOCAINE) 2 % solution Patient: Mix 1part 2% viscous lidocaine, 1part H20. Swish & swallow 59m of diluted mixture, 351m before meals and at bedtime, up to QID   . metoprolol tartrate (LOPRESSOR) 25 MG tablet TAKE 1/2 TABLET BY MOUTH TWICE DAILY (Patient taking differently: Take 12.5 mg by mouth 2 (two) times daily. )   . Misc. Devices MISC 10 mL saline flushes - one box   . MONOJECT PREFILL ADVANCED NACL 0.9 % SOLN injection USE AS DIRECTED.A   . naproxen (NAPROSYN) 250 MG tablet Take 250 mg by mouth 2 (two) times daily.   . Marland Kitchenmeprazole (PRILOSEC) 20 MG capsule Take 20 mg by mouth daily.    . sodium chloride irrigation 0.9 % irrigation Irrigate with 200 mLs as directed continuous.   . Tracheostomy Care KIT 1 kit by Does not apply route daily.   . clopidogrel (PLAVIX) 75 MG tablet Take 1 tablet (75 mg total) by mouth daily. (Patient not taking: Reported on 02/17/2019) 03/06/2019: Pt will start back taking in a couple weeks per pt  . HYDROcodone-acetaminophen (NORCO) 10-325 MG tablet Take 1 tablet by mouth every 4 (four) hours as needed. (Patient not taking: Reported on 03/06/2019)   . lidocaine-prilocaine (EMLA) cream Apply 1 application topically as needed. Apply small amount to port site and cover with plastic wrap one hour before appointment. (Patient not taking: Reported on 03/13/2019)   . nitroGLYCERIN (NITROSTAT) 0.4 MG SL tablet Place 1 tablet (0.4 mg total) under the tongue every 5 (five) minutes as needed for chest pain (CP or SOB). (Patient not taking: Reported on 02/17/2019)   . prochlorperazine (COMPAZINE) 10 MG tablet Take 1 tablet (10 mg total) by mouth every 6 (six)  hours as needed (Nausea or vomiting). (Patient not taking: Reported on 03/06/2019)   . traMADol (ULTRAM) 50 MG tablet Take 50-100 mg by mouth 4 (four) times daily as needed (pain.).    . [DISCONTINUED] atorvastatin (LIPITOR) 40 MG tablet Take 40 mg by mouth daily.    Facility-Administered Encounter  Medications as of 03/13/2019  Medication  . 0.9 %  sodium chloride infusion    ALLERGIES:  Allergies  Allergen Reactions  . Prednisone Other (See Comments)    Chest pain     PHYSICAL EXAM:  ECOG Performance status: 1  There were no vitals filed for this visit. There were no vitals filed for this visit.  Physical Exam Vitals reviewed.  Constitutional:      Appearance: Normal appearance.  Neck:     Comments: Tracheostomy site is not bleeding. Cardiovascular:     Rate and Rhythm: Normal rate and regular rhythm.     Heart sounds: Normal heart sounds.  Pulmonary:     Effort: Pulmonary effort is normal.     Breath sounds: Normal breath sounds.  Abdominal:     General: There is no distension.     Palpations: Abdomen is soft. There is no mass.     Comments: PEG site is within normal limits.  Musculoskeletal:        General: No swelling.  Skin:    General: Skin is warm.  Neurological:     General: No focal deficit present.     Mental Status: He is alert and oriented to person, place, and time.  Psychiatric:        Mood and Affect: Mood normal.        Behavior: Behavior normal.      LABORATORY DATA:  I have reviewed the labs as listed.  CBC    Component Value Date/Time   WBC 6.6 03/13/2019 0844   RBC 3.09 (L) 03/13/2019 0844   HGB 9.7 (L) 03/13/2019 0844   HCT 29.6 (L) 03/13/2019 0844   PLT 363 03/13/2019 0844   MCV 95.8 03/13/2019 0844   MCH 31.4 03/13/2019 0844   MCHC 32.8 03/13/2019 0844   RDW 12.5 03/13/2019 0844   LYMPHSABS 1.1 03/13/2019 0844   MONOABS 0.9 03/13/2019 0844   EOSABS 0.1 03/13/2019 0844   BASOSABS 0.0 03/13/2019 0844   CMP Latest Ref Rng &  Units 03/13/2019 03/07/2019 03/06/2019  Glucose 70 - 99 mg/dL 109(H) 133(H) 106(H)  BUN 6 - 20 mg/dL '11 9 18  '$ Creatinine 0.61 - 1.24 mg/dL 0.88 1.05 1.71(H)  Sodium 135 - 145 mmol/L 133(L) 137 132(L)  Potassium 3.5 - 5.1 mmol/L 3.9 3.5 3.4(L)  Chloride 98 - 111 mmol/L 98 104 100  CO2 22 - 32 mmol/L '25 23 23  '$ Calcium 8.9 - 10.3 mg/dL 9.0 8.8(L) 8.5(L)  Total Protein 6.5 - 8.1 g/dL 7.2 - 6.8  Total Bilirubin 0.3 - 1.2 mg/dL 0.8 - 0.4  Alkaline Phos 38 - 126 U/L 61 - 52  AST 15 - 41 U/L 21 - 18  ALT 0 - 44 U/L 26 - 21       DIAGNOSTIC IMAGING:  I have independently reviewed the scans and discussed with the patient.     ASSESSMENT & PLAN:   Malignant neoplasm of supraglottis ([facility]) 1.  T3N2C supraglottic squamous cell carcinoma: -Laryngoscopy and biopsy on 01/02/2019, invasive squamous cell carcinoma. -PET scan on 01/27/2019 showed hypermetabolic lesion in the left and posterior laryngeal region SUV 21.9.  Hypermetabolic lymphadenopathy in the neck bilaterally at level 2 and 3 positions. -Lurline Idol was placed because of the extent of tumor on 02/18/2019. -Cycle 1 of weekly cisplatin on 03/07/2019. -He did not experience any major GI side effects from first cycle. -We reviewed his labs.  Creatinine is 0.8.  White count and platelet count is adequate.  LFTs  are normal.  He will proceed with his cycle 2 today. -He will come back for post chemo hydration tomorrow.  I will reevaluate him in 1 week.  2.  Nutrition: -He lost couple of pounds since last week. -He has a PEG tube placed on 02/24/2019.  He is not using it for nutritional purposes.  He is eating by mouth without any problems.  3.  Renal insufficiency: -His creatinine went up to 1.71 last week.  This has improved after fluids.  Today it is 0.88. -He was encouraged to drink 3 L of water daily.  4.  CVA: -He had pontine stroke and large vessel occlusion. -He had bleeding from tracheal site and Plavix was held since 03/04/2019.  He is  continuing aspirin 81 mg daily.      Orders placed this encounter:  No orders of the defined types were placed in this encounter.    [name], MD [place] [phone]

## 2019-03-13 NOTE — Patient Instructions (Signed)
[facility] Discharge Instructions for Patients Receiving Chemotherapy  Today you received the following chemotherapy agents   To help prevent nausea and vomiting after your treatment, we encourage you to take your nausea medication   If you develop nausea and vomiting that is not controlled by your nausea medication, call the clinic.   BELOW ARE SYMPTOMS THAT SHOULD BE REPORTED IMMEDIATELY:  *FEVER GREATER THAN 100.5 F  *CHILLS WITH OR WITHOUT FEVER  NAUSEA AND VOMITING THAT IS NOT CONTROLLED WITH YOUR NAUSEA MEDICATION  *UNUSUAL SHORTNESS OF BREATH  *UNUSUAL BRUISING OR BLEEDING  TENDERNESS IN MOUTH AND THROAT WITH OR WITHOUT PRESENCE OF ULCERS  *URINARY PROBLEMS  *BOWEL PROBLEMS  UNUSUAL RASH Items with * indicate a potential emergency and should be followed up as soon as possible.  Feel free to call the clinic should you have any questions or concerns. The clinic phone number is [phone].  Please show the CHEMO ALERT CARD at check-in to the Emergency Department and triage nurse.   

## 2019-03-13 NOTE — Progress Notes (Signed)
Labs reviewed with MD today at office visit. Will proceed as planned and will give post hydration fluids tomorrow per MD due to radiation appointment today.  Treatment given per orders. Patient tolerated it well without problems. Vitals stable and discharged home from clinic ambulatory. Follow up as scheduled.

## 2019-03-13 NOTE — Progress Notes (Signed)
Patient has been assessed, vital signs and labs have been reviewed by Dr. Delton Coombes. ANC, Creatinine, LFTs, and Platelets are within treatment parameters per Dr. Delton Coombes. The patient is good to proceed with treatment at this time. Give post treatment hydration tomorrow due to radiation this afternoon.

## 2019-03-13 NOTE — Patient Instructions (Addendum)
[place] at [facility] Discharge Instructions  You were seen today by Dr. Delton Coombes. He went over your recent lab results. Come back tomorrow for fluids. He will see you back in 1 week for labs, treatment and follow up.   Thank you for choosing [place] at [facility] to provide your oncology and hematology care.  To afford each patient quality time with our provider, please arrive at least 15 minutes before your scheduled appointment time.   If you have a lab appointment with the [facility] please come in thru the  Main Entrance and check in at the main information desk  You need to re-schedule your appointment should you arrive 10 or more minutes late.  We strive to give you quality time with our providers, and arriving late affects you and other patients whose appointments are after yours.  Also, if you no show three or more times for appointments you may be dismissed from the clinic at the providers discretion.     Again, thank you for choosing [facility].  Our hope is that these requests will decrease the amount of time that you wait before being seen by our physicians.       _____________________________________________________________  Should you have questions after your visit to [facility], please contact our office at [phone] between the hours of 8:00 a.m. and 4:30 p.m.  Voicemails left after 4:00 p.m. will not be returned until the following business day.  For prescription refill requests, have your pharmacy contact our office and allow 72 hours.    Cancer Center Support Programs:   > Cancer Support Group  2nd Tuesday of the month 1pm-2pm, Journey Room

## 2019-03-14 ENCOUNTER — Other Ambulatory Visit: Payer: Self-pay

## 2019-03-14 ENCOUNTER — Inpatient Hospital Stay (HOSPITAL_COMMUNITY): Payer: 59

## 2019-03-14 ENCOUNTER — Ambulatory Visit
Admission: RE | Admit: 2019-03-14 | Discharge: 2019-03-14 | Disposition: A | Discharge status: Home / Self Care | Payer: 59 | Source: Ambulatory Visit | Attending: Radiation Oncology | Admitting: Radiation Oncology

## 2019-03-14 ENCOUNTER — Telehealth: Payer: Self-pay | Admitting: *Deleted

## 2019-03-14 ENCOUNTER — Encounter: Payer: Self-pay | Admitting: *Deleted

## 2019-03-14 VITALS — BP 155/87 | HR 60 | Temp 97.1°F | Resp 18

## 2019-03-14 DIAGNOSIS — C329 Malignant neoplasm of larynx, unspecified: Secondary | ICD-10-CM

## 2019-03-14 DIAGNOSIS — E860 Dehydration: Secondary | ICD-10-CM

## 2019-03-14 MED ORDER — POTASSIUM CHLORIDE 2 MEQ/ML IV SOLN
Freq: Once | INTRAVENOUS | Status: AC
  Filled 2019-03-14: qty 1000

## 2019-03-14 NOTE — Telephone Encounter (Signed)
Oncology Nurse Navigator Documentation  In follow-up to my earlier call, received call from [facility], [facility], indicating per Dr. Kae Heller OK to remove sutures on PEG retention disc.  [name], RN, BSN Head & Neck Oncology Nurse [place] at [facility] [phone]

## 2019-03-14 NOTE — Progress Notes (Signed)
Post hydration fluids from treatment given today per Dr. Delton Coombes.  Patient tolerated it well without problems. Vitals stable and discharged home from clinic ambulatory. Follow up as scheduled.

## 2019-03-14 NOTE — Progress Notes (Signed)
Oncology Nurse Navigator Documentation  Met with [name] s/p RT for PEG management. Insertion site with disc appeared WNL, skin mildly erythematous at suture sites. Removed retention disc sutures, noted scant amount of fresh grayish discharge at insertion site, dried/crusted discharge in area of sutures. Cleaned insertion site and area under disc with normal saline,  placed single drainage sponge under disc. I encouraged him to ask for me next week for additional evaluation PRN.  He agreed to do so.  [name], RN, BSN Head & Neck Oncology Nurse [place] at [place] [phone]

## 2019-03-17 ENCOUNTER — Inpatient Hospital Stay (HOSPITAL_COMMUNITY)
Admission: EM | Admit: 2019-03-17 | Discharge: 2019-03-18 | DRG: 871 | Disposition: A | Discharge status: Home / Self Care | Payer: 59 | Attending: Internal Medicine | Admitting: Internal Medicine

## 2019-03-17 ENCOUNTER — Telehealth: Payer: Self-pay | Admitting: Adult Health

## 2019-03-17 ENCOUNTER — Telehealth: Payer: Self-pay | Admitting: *Deleted

## 2019-03-17 ENCOUNTER — Other Ambulatory Visit: Payer: Self-pay

## 2019-03-17 ENCOUNTER — Ambulatory Visit: Payer: 59

## 2019-03-17 ENCOUNTER — Emergency Department (HOSPITAL_COMMUNITY): Payer: 59

## 2019-03-17 ENCOUNTER — Encounter (HOSPITAL_COMMUNITY): Payer: Self-pay

## 2019-03-17 DIAGNOSIS — Z8673 Personal history of transient ischemic attack (TIA), and cerebral infarction without residual deficits: Secondary | ICD-10-CM

## 2019-03-17 DIAGNOSIS — Z9221 Personal history of antineoplastic chemotherapy: Secondary | ICD-10-CM

## 2019-03-17 DIAGNOSIS — I2510 Atherosclerotic heart disease of native coronary artery without angina pectoris: Secondary | ICD-10-CM | POA: Diagnosis present

## 2019-03-17 DIAGNOSIS — Z79899 Other long term (current) drug therapy: Secondary | ICD-10-CM

## 2019-03-17 DIAGNOSIS — Z20822 Contact with and (suspected) exposure to covid-19: Secondary | ICD-10-CM | POA: Diagnosis present

## 2019-03-17 DIAGNOSIS — K219 Gastro-esophageal reflux disease without esophagitis: Secondary | ICD-10-CM | POA: Diagnosis present

## 2019-03-17 DIAGNOSIS — Z888 Allergy status to other drugs, medicaments and biological substances status: Secondary | ICD-10-CM | POA: Diagnosis not present

## 2019-03-17 DIAGNOSIS — D84821 Immunodeficiency due to drugs: Secondary | ICD-10-CM | POA: Diagnosis present

## 2019-03-17 DIAGNOSIS — J440 Chronic obstructive pulmonary disease with acute lower respiratory infection: Secondary | ICD-10-CM | POA: Diagnosis present

## 2019-03-17 DIAGNOSIS — E7800 Pure hypercholesterolemia, unspecified: Secondary | ICD-10-CM | POA: Diagnosis present

## 2019-03-17 DIAGNOSIS — Z955 Presence of coronary angioplasty implant and graft: Secondary | ICD-10-CM

## 2019-03-17 DIAGNOSIS — Z7902 Long term (current) use of antithrombotics/antiplatelets: Secondary | ICD-10-CM | POA: Diagnosis not present

## 2019-03-17 DIAGNOSIS — E871 Hypo-osmolality and hyponatremia: Secondary | ICD-10-CM | POA: Diagnosis present

## 2019-03-17 DIAGNOSIS — Z931 Gastrostomy status: Secondary | ICD-10-CM

## 2019-03-17 DIAGNOSIS — C321 Malignant neoplasm of supraglottis: Secondary | ICD-10-CM | POA: Diagnosis present

## 2019-03-17 DIAGNOSIS — Z923 Personal history of irradiation: Secondary | ICD-10-CM

## 2019-03-17 DIAGNOSIS — I951 Orthostatic hypotension: Secondary | ICD-10-CM

## 2019-03-17 DIAGNOSIS — Z8249 Family history of ischemic heart disease and other diseases of the circulatory system: Secondary | ICD-10-CM

## 2019-03-17 DIAGNOSIS — J189 Pneumonia, unspecified organism: Secondary | ICD-10-CM

## 2019-03-17 DIAGNOSIS — Z7982 Long term (current) use of aspirin: Secondary | ICD-10-CM

## 2019-03-17 DIAGNOSIS — A419 Sepsis, unspecified organism: Secondary | ICD-10-CM

## 2019-03-17 DIAGNOSIS — T451X5A Adverse effect of antineoplastic and immunosuppressive drugs, initial encounter: Secondary | ICD-10-CM | POA: Diagnosis present

## 2019-03-17 DIAGNOSIS — F1010 Alcohol abuse, uncomplicated: Secondary | ICD-10-CM | POA: Diagnosis present

## 2019-03-17 DIAGNOSIS — A4189 Other specified sepsis: Secondary | ICD-10-CM | POA: Diagnosis not present

## 2019-03-17 DIAGNOSIS — I10 Essential (primary) hypertension: Secondary | ICD-10-CM | POA: Diagnosis present

## 2019-03-17 DIAGNOSIS — Z930 Tracheostomy status: Secondary | ICD-10-CM | POA: Diagnosis not present

## 2019-03-17 DIAGNOSIS — Z87891 Personal history of nicotine dependence: Secondary | ICD-10-CM

## 2019-03-17 LAB — PROTIME-INR
INR: 1 (ref 0.8–1.2)
Prothrombin Time: 13.1 seconds (ref 11.4–15.2)

## 2019-03-17 LAB — CBC WITH DIFFERENTIAL/PLATELET
Abs Immature Granulocytes: 0.06 10*3/uL (ref 0.00–0.07)
Basophils Absolute: 0 10*3/uL (ref 0.0–0.1)
Basophils Relative: 0 %
Eosinophils Absolute: 0 10*3/uL (ref 0.0–0.5)
Eosinophils Relative: 0 %
HCT: 31.9 % — ABNORMAL LOW (ref 39.0–52.0)
Hemoglobin: 10.3 g/dL — ABNORMAL LOW (ref 13.0–17.0)
Immature Granulocytes: 1 %
Lymphocytes Relative: 3 %
Lymphs Abs: 0.2 10*3/uL — ABNORMAL LOW (ref 0.7–4.0)
MCH: 30.9 pg (ref 26.0–34.0)
MCHC: 32.3 g/dL (ref 30.0–36.0)
MCV: 95.8 fL (ref 80.0–100.0)
Monocytes Absolute: 0.3 10*3/uL (ref 0.1–1.0)
Monocytes Relative: 5 %
Neutro Abs: 5.7 10*3/uL (ref 1.7–7.7)
Neutrophils Relative %: 91 %
Platelets: 243 10*3/uL (ref 150–400)
RBC: 3.33 MIL/uL — ABNORMAL LOW (ref 4.22–5.81)
RDW: 12.8 % (ref 11.5–15.5)
WBC: 6.2 10*3/uL (ref 4.0–10.5)
nRBC: 0 % (ref 0.0–0.2)

## 2019-03-17 LAB — COMPREHENSIVE METABOLIC PANEL
ALT: 41 U/L (ref 0–44)
AST: 44 U/L — ABNORMAL HIGH (ref 15–41)
Albumin: 3.6 g/dL (ref 3.5–5.0)
Alkaline Phosphatase: 61 U/L (ref 38–126)
Anion gap: 11 (ref 5–15)
BUN: 18 mg/dL (ref 6–20)
CO2: 24 mmol/L (ref 22–32)
Calcium: 8.6 mg/dL — ABNORMAL LOW (ref 8.9–10.3)
Chloride: 92 mmol/L — ABNORMAL LOW (ref 98–111)
Creatinine, Ser: 1.12 mg/dL (ref 0.61–1.24)
GFR calc Af Amer: >60 mL/min (ref >60)
GFR calc non Af Amer: >60 mL/min (ref >60)
Glucose, Bld: 136 mg/dL — ABNORMAL HIGH (ref 70–99)
Potassium: 3.7 mmol/L (ref 3.5–5.1)
Sodium: 127 mmol/L — ABNORMAL LOW (ref 135–145)
Total Bilirubin: 0.7 mg/dL (ref 0.3–1.2)
Total Protein: 7.5 g/dL (ref 6.5–8.1)

## 2019-03-17 LAB — URINALYSIS, ROUTINE W REFLEX MICROSCOPIC
Bacteria, UA: NONE SEEN
Bilirubin Urine: NEGATIVE
Glucose, UA: NEGATIVE mg/dL
Hgb urine dipstick: NEGATIVE
Ketones, ur: NEGATIVE mg/dL
Leukocytes,Ua: NEGATIVE
Nitrite: NEGATIVE
Protein, ur: 30 mg/dL — AB
Specific Gravity, Urine: 1.021 (ref 1.005–1.030)
pH: 6 (ref 5.0–8.0)

## 2019-03-17 LAB — LACTIC ACID, PLASMA
Lactic Acid, Venous: 3.7 mmol/L (ref 0.5–1.9)
Lactic Acid, Venous: 0.7 mmol/L (ref 0.5–1.9)

## 2019-03-17 LAB — APTT: aPTT: 26 seconds (ref 24–36)

## 2019-03-17 MED ORDER — NAPROXEN 250 MG PO TABS
250.0000 mg | ORAL_TABLET | Freq: Two times a day (BID) | ORAL | Status: DC
  Administered 2019-03-17 – 2019-03-18 (×2): 250 mg via ORAL
  Filled 2019-03-17 (×2): qty 1

## 2019-03-17 MED ORDER — AZITHROMYCIN 250 MG PO TABS
500.0000 mg | ORAL_TABLET | Freq: Every day | ORAL | Status: DC
  Administered 2019-03-17 – 2019-03-18 (×2): 500 mg via ORAL
  Filled 2019-03-17 (×2): qty 2

## 2019-03-17 MED ORDER — ACETAMINOPHEN 325 MG PO TABS
650.0000 mg | ORAL_TABLET | Freq: Once | ORAL | Status: AC
  Administered 2019-03-17: 650 mg via ORAL
  Filled 2019-03-17: qty 2

## 2019-03-17 MED ORDER — SODIUM CHLORIDE 0.45 % IV SOLN: INTRAVENOUS | Status: DC

## 2019-03-17 MED ORDER — METOPROLOL TARTRATE 25 MG PO TABS
12.5000 mg | ORAL_TABLET | Freq: Two times a day (BID) | ORAL | Status: DC
  Administered 2019-03-17 – 2019-03-18 (×2): 12.5 mg via ORAL
  Filled 2019-03-17 (×2): qty 1

## 2019-03-17 MED ORDER — HYDROCODONE-ACETAMINOPHEN 5-325 MG PO TABS
1.0000 | ORAL_TABLET | ORAL | Status: AC | PRN
  Administered 2019-03-18: 1 via ORAL
  Filled 2019-03-17: qty 1

## 2019-03-17 MED ORDER — SODIUM CHLORIDE 0.9 % IV BOLUS
1000.0000 mL | Freq: Once | INTRAVENOUS | Status: AC
  Administered 2019-03-17: 1000 mL via INTRAVENOUS

## 2019-03-17 MED ORDER — SODIUM CHLORIDE 0.9 % IV SOLN
2.0000 g | Freq: Once | INTRAVENOUS | Status: DC
  Filled 2019-03-17: qty 2

## 2019-03-17 MED ORDER — METRONIDAZOLE IN NACL 5-0.79 MG/ML-% IV SOLN
500.0000 mg | Freq: Once | INTRAVENOUS | Status: DC
  Filled 2019-03-17: qty 100

## 2019-03-17 MED ORDER — ASPIRIN EC 81 MG PO TBEC
81.0000 mg | DELAYED_RELEASE_TABLET | Freq: Every day | ORAL | Status: DC
  Administered 2019-03-17 – 2019-03-18 (×2): 81 mg via ORAL
  Filled 2019-03-17 (×2): qty 1

## 2019-03-17 MED ORDER — ATORVASTATIN CALCIUM 40 MG PO TABS
80.0000 mg | ORAL_TABLET | Freq: Every evening | ORAL | Status: DC
  Administered 2019-03-17: 80 mg via ORAL
  Filled 2019-03-17: qty 2

## 2019-03-17 MED ORDER — PANTOPRAZOLE SODIUM 40 MG PO TBEC
40.0000 mg | DELAYED_RELEASE_TABLET | Freq: Every day | ORAL | Status: DC
  Administered 2019-03-17 – 2019-03-18 (×2): 40 mg via ORAL
  Filled 2019-03-17 (×2): qty 1

## 2019-03-17 MED ORDER — TRAMADOL HCL 50 MG PO TABS
50.0000 mg | ORAL_TABLET | Freq: Four times a day (QID) | ORAL | Status: DC | PRN
  Administered 2019-03-17: 100 mg via ORAL
  Filled 2019-03-17: qty 2

## 2019-03-17 MED ORDER — SODIUM CHLORIDE 0.9 % IV SOLN
500.0000 mg | INTRAVENOUS | Status: DC
  Administered 2019-03-17: 500 mg via INTRAVENOUS
  Filled 2019-03-17: qty 500

## 2019-03-17 MED ORDER — SODIUM CHLORIDE 0.9 % IV SOLN: 2.0000 g | Freq: Three times a day (TID) | INTRAVENOUS | Status: DC

## 2019-03-17 MED ORDER — SODIUM CHLORIDE 0.9 % IV SOLN
2.0000 g | INTRAVENOUS | Status: DC
  Administered 2019-03-17: 2 g via INTRAVENOUS
  Filled 2019-03-17: qty 20

## 2019-03-17 MED ORDER — LIDOCAINE VISCOUS HCL 2 % MT SOLN: 15.0000 mL | OROMUCOSAL | Status: DC | PRN

## 2019-03-17 MED ORDER — SODIUM CHLORIDE 0.9 % IR SOLN
200.0000 mL | Status: DC
  Administered 2019-03-18: 200 mL

## 2019-03-17 MED ORDER — DULOXETINE HCL 30 MG PO CPEP
30.0000 mg | ORAL_CAPSULE | Freq: Every day | ORAL | Status: DC
  Administered 2019-03-18 (×2): 30 mg via ORAL
  Filled 2019-03-17 (×3): qty 1

## 2019-03-17 MED ORDER — VANCOMYCIN HCL IN DEXTROSE 1-5 GM/200ML-% IV SOLN: 1000.0000 mg | Freq: Once | INTRAVENOUS | Status: DC

## 2019-03-17 MED ORDER — SODIUM CHLORIDE 0.9 % IV SOLN
2.0000 g | Freq: Once | INTRAVENOUS | Status: AC
  Administered 2019-03-17: 2 g via INTRAVENOUS

## 2019-03-17 MED ORDER — VANCOMYCIN HCL 1500 MG/300ML IV SOLN
1500.0000 mg | Freq: Once | INTRAVENOUS | Status: DC
  Filled 2019-03-17: qty 300

## 2019-03-17 NOTE — Telephone Encounter (Signed)
I agree with your recommendations.  He needs further evaluation in the emergency department and highly recommend wife calling 911 for EMS evaluation.  Thank you for your assistance.

## 2019-03-17 NOTE — ED Triage Notes (Signed)
Pt brought to ED by wife. Pt is a cancer pt currently getting chemo and radiation. Pt states his BP has been low he "thinks 105 but unsure". Pt states he feels fine but has been a little dizzy at times but not dizzy now.

## 2019-03-17 NOTE — Telephone Encounter (Signed)
Called the wife back and there was no answer. LVM advising that in just reading below what was sent to me the patient sounds like 911 needs to be called. I didn't see anything previously in office notes discussing the patient having seizures. If the patient is pale in color and sweating this could mean that his blood pressure is low, or blood sugar. It could mean multiple things. I left a detailed message informing they should reach out to 911 and have EMS assess the patient. I have also asked for her to call us back with an update.

## 2019-03-17 NOTE — H&P (Signed)
History and Physical    BING DUFFEY KKX:[id number] DOB: June 23, 1960 DOA: 03/17/2019  PCP: [name], MD (Confirm with patient/family/NH records and if not entered, this has to be entered at [facility] point of entry) Patient coming from: home  I have personally briefly reviewed patient's old medical records in [place]  Chief Complaint: weakness, palor, light-headness  HPI: [name] is a 59 y.o. 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 He is s/p tracheostomy after intubation injury during dental extraction and gastrostomy tube placement 2/2 altered gastric anatomy. He presents to the emergency department today for fatigue and intermittent lightheadedness over the past few days.  Patient states he feels fatigued, somewhat generally weak, with intermittent lightheadedness. States lightheadedness occurs mostly when up and moving around or transitioning positions.  No other alleviating or aggravating factors.  He had been eating and drinking fairly well over the past few days (via PO, not utilizing gastrostomy at this time) but had decreased appetite today.  He mentions some chills over the past few days as well, but none at present. He states his wife was concerned that he appears pale with his lightheadedness episodes and does get a bit shaky.  He has a baseline cough w/o acute change. Denies fever, dyspnea, chest pain, abdominal pain, nausea, vomiting, diarrhea, melena, hematochezia, bleeding from tracheostomy or gastrostomy site, dysuria, syncope,  focal numbness/weakness, dizziness like the room spinning, headache, visual disturbance, or rashes.  He states that his last chemo/radiation session was this previous Friday (03/14/19).  He states that he called his oncologist office and there was concern for possible blood  infection.  Denies recent medication changes. (For level 3, the HPI must include 4+ descriptors: Location, Quality, Severity, Duration, Timing, Context, modifying factors, associated signs/symptoms and/or status of 3+ chronic problems.)  (Please avoid self-populating past medical history here) (The initial 2-3 lines should be focused and good to copy and paste in the HPI section of the daily progress note).  ED Course: Initial BP stable but with marked orthostatic drop to 69/53 standing. Became febrile to 101.7 in ED. CXR with streaky opacities RLL atelectasis vs infiltrate. WBC 6.2 (maybe suppressed after chemo 03/14/19) with left shift - 91/3/5. Patient's initial lactic acid lelvel was 3.7, on repeat after fluid bolus came down to 0.7. He is referred to [facility] for admission for treatment of CAP in an immunocompromised patient.  Review of Systems: As per HPI otherwise 10 point review of systems negative.  Unacceptable ROS statements: "10 systems reviewed," "Extensive" (without elaboration).  Acceptable ROS statements: "All others negative," "All others reviewed and are negative," and "All others unremarkable," with at [place] documented Can't double dip - if using for HPI can't use for ROS  Past Medical History:  Diagnosis Date  . Anxiety   . Arthritis    back  . Bradycardia    a. During 11/2012 adm: lopressor decreased.  . Cancer of larynx ([place])   . Carotid artery stenosis   . 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 Coronary artery disease    a. Diag cath 10/2012 for CP/abnormal nuc -> planned PCI s/p LAD atherectomy/DES placement 11/05/12.  Marland Kitchen GERD (gastroesophageal reflux disease)   . History of hiatal hernia   . Hypercholesteremia   . Hypertension   .  Stroke ([place]) 07/31/2018   bilateral vertebrobasilar occlusion; "left side weaker thanit was before."    Past Surgical History:  Procedure Laterality  Date  . CARDIAC CATHETERIZATION  10/29/2012  . CORONARY ANGIOPLASTY WITH STENT PLACEMENT  11/05/2012  . DIRECT LARYNGOSCOPY N/A 01/02/2019   Procedure: MICRO DIRECT LARYNGOSCOPY BIOPSY OF LARYNGEAL MASS;  Surgeon: [name], MD;  Location: [facility];  Service: ENT;  Laterality: N/A;  . HEMORRHOID SURGERY  ~ 2010  . INSERTION OF MESH N/A 06/02/2015   Procedure: INSERTION OF MESH;  Surgeon: [name], MD;  Location: [facility];  Service: General;  Laterality: N/A;  . IR ANGIO INTRA EXTRACRAN SEL COM CAROTID INNOMINATE BILAT MOD SED  08/02/2018  . IR ANGIO VERTEBRAL SEL VERTEBRAL BILAT MOD SED  08/02/2018  . LAPAROSCOPIC INSERTION GASTROSTOMY TUBE N/A 02/24/2019   Procedure: LAPAROSCOPIC INSERTION GASTROSTOMY TUBE;  Surgeon: [name], MD;  Location: [place];  Service: General;  Laterality: N/A;  . LEFT HEART CATHETERIZATION WITH CORONARY ANGIOGRAM N/A 10/30/2012   Procedure: LEFT HEART CATHETERIZATION WITH CORONARY ANGIOGRAM;  Surgeon: [name], MD;  Location: [facility] CATH LAB;  Service: Cardiovascular;  Laterality: N/A;  . MULTIPLE EXTRACTIONS WITH ALVEOLOPLASTY N/A 02/18/2019   Procedure: Extraction of tooth #'s 5-7, 10,11,14,20-29, 31 and 32 with alveoloplasty and maxiillary left buccal exostoses reductions;  Surgeon: [name], [name];  Location: [place];  Service: Oral Surgery;  Laterality: N/A;  . PERCUTANEOUS CORONARY ROTOBLATOR INTERVENTION (PCI-R) N/A 11/05/2012   Procedure: PERCUTANEOUS CORONARY ROTOBLATOR INTERVENTION (PCI-R);  Surgeon: [name], MD;  Location: [facility] CATH LAB;  Service: Cardiovascular;  Laterality: N/A;  . TRACHEOSTOMY TUBE PLACEMENT N/A 02/18/2019   Procedure: Tracheostomy;  Surgeon: [name], MD;  Location: [place];  Service: ENT;  Laterality: N/A;  . UMBILICAL HERNIA REPAIR N/A 06/02/2015   Procedure: UMBILICAL HERNIORRHAPHY WITH MESH;  Surgeon: [name], MD;  Location: [facility];  Service: General;  Laterality: N/A;   Soc Hx - married 36 years. Two sons, 2 daughters,  3 grandsons, 2 grand-daughters. 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 Also had CVA last summer. Lives with his wife. Has been independent in ADLs.   reports that he quit smoking about 12 years ago. His smoking use included cigarettes. He started smoking about 41 years ago. He has a 96.00 pack-year smoking history. He has quit using smokeless tobacco.  His smokeless tobacco use included chew. He reports current alcohol use of about 12.0 standard drinks of alcohol per week. He reports current drug use. Drug: Marijuana.  Allergies  Allergen Reactions  . Prednisone Other (See Comments)    Chest pain    Family History  Problem Relation Age of Onset  . Hypertension Mother   . Heart disease Father   . Hypertension Father   . Heart disease Sister   . Hypertension Maternal Grandmother   . Hypertension Maternal Grandfather   . Hypertension Paternal Grandmother   . Hypertension Paternal Grandfather    Unacceptable: Noncontributory, unremarkable, or negative. Acceptable: Family history reviewed and not pertinent (If you reviewed it)  Prior to Admission medications   Medication Sig Start Date End Date Taking? Authorizing Provider  aspirin EC 81 MG tablet Take 81 mg by mouth daily.   Yes [provider]  atorvastatin (LIPITOR) 80 MG tablet Take 1 tablet (80 mg total) by mouth every evening. 10/23/17  Yes Herminio Commons, MD  Catheters (ARGYLE SUCTION CATHETER 14FR) MISC 1 Device by Does  not apply route daily. 02/25/19  Yes Harold Hedge, MD  CISPLATIN IV Inject into the vein once a week. Weekly in conjunction with radiation therapy beginning 03/06/2019 03/04/19  Yes [provider]  DULoxetine (CYMBALTA) 30 MG capsule Take 30 mg by mouth daily. 02/25/19  Yes [provider]  metoprolol tartrate (LOPRESSOR) 25 MG tablet TAKE 1/2 TABLET BY MOUTH TWICE DAILY 01/25/15  Yes Herminio Commons, MD  Misc. Devices MISC 10 mL saline  flushes - one box 03/03/19  Yes [name], MD  MONOJECT PREFILL ADVANCED NACL 0.9 % SOLN injection USE AS DIRECTED.A 03/03/19  Yes [provider]  naproxen (NAPROSYN) 250 MG tablet Take 250 mg by mouth 2 (two) times daily. 02/25/19  Yes [provider]  sodium chloride irrigation 0.9 % irrigation Irrigate with 200 mLs as directed continuous. 02/25/19  Yes Harold Hedge, MD  Tracheostomy Care KIT 1 kit by Does not apply route daily. 02/25/19  Yes Harold Hedge, MD  traMADol (ULTRAM) 50 MG tablet Take 50-100 mg by mouth 4 (four) times daily as needed (pain.).  01/29/19  Yes [provider]  amLODipine (NORVASC) 5 MG tablet Take 1 tablet (5 mg total) by mouth daily. Patient not taking: Reported on 03/17/2019 12/19/18   Frann Rider, NP  clopidogrel (PLAVIX) 75 MG tablet Take 1 tablet (75 mg total) by mouth daily. Patient not taking: Reported on 02/17/2019 12/24/18   Frann Rider, NP  HYDROcodone-acetaminophen (NORCO) 10-325 MG tablet Take 1 tablet by mouth every 4 (four) hours as needed. Patient not taking: Reported on 03/06/2019 06/02/15   [name], MD  lidocaine (XYLOCAINE) 2 % solution Patient: Mix 1part 2% viscous lidocaine, 1part H20. Swish & swallow 61m of diluted mixture, 342m before meals and at bedtime, up to QID 03/10/19   SqEppie GibsonMD  lidocaine-prilocaine (EMLA) cream Apply 1 application topically as needed. Apply small amount to port site and cover with plastic wrap one hour before appointment. Patient not taking: Reported on 03/13/2019 02/27/19   KaDerek JackMD  nitroGLYCERIN (NITROSTAT) 0.4 MG SL tablet Place 1 tablet (0.4 mg total) under the tongue every 5 (five) minutes as needed for chest pain (CP or SOB). Patient not taking: Reported on 02/17/2019 07/27/14   KoHerminio CommonsMD  omeprazole (PRILOSEC) 20 MG capsule Take 20 mg by mouth daily.  07/22/18   [provider]  prochlorperazine (COMPAZINE) 10 MG tablet Take 1  tablet (10 mg total) by mouth every 6 (six) hours as needed (Nausea or vomiting). Patient not taking: Reported on 03/06/2019 03/06/19   KaDerek JackMD    Physical Exam: Vitals:   03/17/19 1553 03/17/19 1658 03/17/19 1700 03/17/19 1730  BP:   109/63 119/63  Pulse: 78  (!) 106 97  Resp: '16  16 20  '$ Temp:  (!) 101.5 F (38.6 C)    TempSrc:  Oral    SpO2: 97%  95% 94%  Weight:      Height:        Constitutional: NAD, calm, comfortable Vitals:   03/17/19 1553 03/17/19 1658 03/17/19 1700 03/17/19 1730  BP:   109/63 119/63  Pulse: 78  (!) 106 97  Resp: '16  16 20  '$ Temp:  (!) 101.5 F (38.6 C)    TempSrc:  Oral    SpO2: 97%  95% 94%  Weight:      Height:       General: U WNWD man in no distress. Able to phonate  using Passey valve. Eyes: PERRL, lids and conjunctivae normal ENMT: Mucous membranes are moist. Posterior pharynx clear of any exudate or lesions.Edentulous Neck: Tracheostomy tube mid-line, w/o swelling, w/o edema,bleeding or discharge. Respiratory:  Normal respiratory effort. No accessory muscle use. Wet coarse breath sounds bilaterally. No rales, no wheezes  Cardiovascular: Regular rate and rhythm, no murmurs / rubs / gallops. No extremity edema. 2+ pedal pulses. No carotid bruits.  Abdomen: no tenderness, no masses palpated. No hepatosplenomegaly. Bowel sounds positive. Gastrostomy tube LUQ Musculoskeletal: no clubbing / cyanosis. No joint deformity upper and lower extremities. Good ROM, no contractures. Normal muscle tone.  Skin: no rashes, lesions, ulcers. No induration Neurologic: CN 2-12 grossly intact. Sensation intact, DTR normal. Strength 5/5 in all 4.  Psychiatric: Normal judgment and insight. Alert and oriented x 3. Normal mood.   (Anything < 9 systems with 2 bullets each down codes to level 1) (If patient refuses exam can't bill higher level) (Make sure to document decubitus ulcers present on admission -- if possible -- and whether patient has chronic  indwelling catheter at time of admission)  Labs on Admission: I have personally reviewed following labs and imaging studies  CBC: Recent Labs  Lab 03/13/19 0844 03/17/19 1337  WBC 6.6 6.2  NEUTROABS 4.4 5.7  HGB 9.7* 10.3*  HCT 29.6* 31.9*  MCV 95.8 95.8  PLT 363 315   Basic Metabolic Panel: Recent Labs  Lab 03/13/19 0844 03/17/19 1337  NA 133* 127*  K 3.9 3.7  CL 98 92*  CO2 25 24  GLUCOSE 109* 136*  BUN 11 18  CREATININE 0.88 1.12  CALCIUM 9.0 8.6*  MG 2.0  --    GFR: Estimated Creatinine Clearance: 70.4 mL/min (by C-G formula based on SCr of 1.12 mg/dL). Liver Function Tests: Recent Labs  Lab 03/13/19 0844 03/17/19 1337  AST 21 44*  ALT 26 41  ALKPHOS 61 61  BILITOT 0.8 0.7  PROT 7.2 7.5  ALBUMIN 3.6 3.6   No results for input(s): LIPASE, AMYLASE in the last 168 hours. No results for input(s): AMMONIA in the last 168 hours. Coagulation Profile: Recent Labs  Lab 03/17/19 1655  INR 1.0   Cardiac Enzymes: No results for input(s): CKTOTAL, CKMB, CKMBINDEX, TROPONINI in the last 168 hours. BNP (last 3 results) No results for input(s): PROBNP in the last 8760 hours. HbA1C: No results for input(s): HGBA1C in the last 72 hours. CBG: No results for input(s): GLUCAP in the last 168 hours. Lipid Profile: No results for input(s): CHOL, HDL, LDLCALC, TRIG, CHOLHDL, LDLDIRECT in the last 72 hours. Thyroid Function Tests: No results for input(s): TSH, T4TOTAL, FREET4, T3FREE, THYROIDAB in the last 72 hours. Anemia Panel: No results for input(s): VITAMINB12, FOLATE, FERRITIN, TIBC, IRON, RETICCTPCT in the last 72 hours. Urine analysis:    Component Value Date/Time   COLORURINE YELLOW 03/17/2019 1600   APPEARANCEUR CLEAR 03/17/2019 1600   LABSPEC 1.021 03/17/2019 1600   PHURINE 6.0 03/17/2019 1600   GLUCOSEU NEGATIVE 03/17/2019 1600   HGBUR NEGATIVE 03/17/2019 1600   BILIRUBINUR NEGATIVE 03/17/2019 1600   KETONESUR NEGATIVE 03/17/2019 1600   PROTEINUR  30 (A) 03/17/2019 1600   NITRITE NEGATIVE 03/17/2019 1600   LEUKOCYTESUR NEGATIVE 03/17/2019 1600    Radiological Exams on Admission: DG Chest Portable 1 View  Result Date: 03/17/2019 CLINICAL DATA:  Fever. EXAM: PORTABLE CHEST 1 VIEW COMPARISON:  Radiograph 03/04/2019 FINDINGS: Tracheostomy tube tip at the thoracic inlet. Right upper extremity central line tip in the SVC. Streaky opacities  at the right lung base are new from prior exam. Unchanged heart size and mediastinal contours. No pulmonary edema or pneumothorax. No large pleural effusion. No acute osseous abnormalities are seen. IMPRESSION: Streaky right lung base opacities, new from radiographs earlier this month, favoring atelectasis but could represent pneumonia in the setting of fever. Electronically Signed   By: Keith Rake M.D.   On: 03/17/2019 15:54    EKG: Independently reviewed. Sinus tachycardia w/o acute changes  Assessment/Plan Active Problems:   RLL pneumonia   Hypertension   Malignant neoplasm of supraglottis ([place])  (please populate well all problems here in Problem List. (For example, if patient is on BP meds at home and you resume or decide to hold them, it is a problem that needs to be her. Same for CAD, COPD, HLD and so on)   1. CAP - patient with streaky opacities. WBC normal range but may be understating infection 2/2 recent chemotherapy. There is a definite left shift. He became markedly febrile. In ED he received Cefipime and IV azithromycin Plan Med-surg admit  Abx: Rocephin 2 g IV q24, Azithromycin 250 mg IV daily.  F/u CXR 3/16, f/u CBC with diff  If no progression may be candidate for home on oral abx.  Low rate IVF to keep hydrated  2. HTN- continue home regime  3. ONcology - taking chemo twice a week. XRT daily M-F. Plan Re-evaluate in AM - if extended in-patient care may need to transfer to [facility]   To continue XRT  DVT prophylaxis: SCDs (Lovenox/Heparin/SCD's/anticoagulated/None (if comfort  care) Code Status: full code (Full/Partial (specify details) Family Communication: spoke with wife: reviewed Dx and Tx (Specify name, relationship. Do not write "discussed with patient". Specify tel # if discussed over the phone) Disposition Plan: home when stable (specify when and where you expect patient to be discharged) Consults called: none (with names) Admission status: inpatient (inpatient / obs / tele / medical floor / SDU)   Adella Hare MD [facility] Pager [phone]  If 7PM-7AM, please contact night-coverage [email or web address] Password [facility]  03/17/2019, 7:56 PM

## 2019-03-17 NOTE — Progress Notes (Signed)
Pharmacy Antibiotic Note  [name] is a 59 y.o. male admitted on 03/17/2019 with hypotension.  Pharmacy has been consulted for cefepime dosing for sepsis.  CXR also shows possible PNA.  Patient is immunosuppressed from chemotherapy/XRT.  SCr 1.12, CrCL 70 ml/min, Tmax 102.5, WBC WNL, LA 3.7, ANC WNL.  Plan: Cefepime 2gm IV Q8H Azith 500mg  IV Q24H per MD Monitor renal fxn, clinical progress  Height: 5\' 8"  (172.7 cm) Weight: 168 lb (76.2 kg) IBW/kg (Calculated) : 68.4  Temp (24hrs), Avg:100.4 F (38 C), Min:97.7 F (36.5 C), Max:102.5 F (39.2 C)  Recent Labs  Lab 03/13/19 0844 03/17/19 1337 03/17/19 1547  WBC 6.6 6.2  --   CREATININE 0.88 1.12  --   LATICACIDVEN  --   --  3.7*    Estimated Creatinine Clearance: 70.4 mL/min (by C-G formula based on SCr of 1.12 mg/dL).    Allergies  Allergen Reactions  . Prednisone Other (See Comments)    Chest pain    Cefepime 3/15 >> Azith 3/15 >>  3/15 covid -  3/15 UCx -  3/15 BCx -   Damico Partin D. Mina Marble, PharmD, BCPS, [place] 03/17/2019, 5:29 PM

## 2019-03-17 NOTE — Progress Notes (Signed)
Order on printer for incentive spirometer.  Upon visit to patients room noticed patient has a trach and will not be able to perform the incentive correctly.

## 2019-03-17 NOTE — ED Notes (Signed)
PICC flushes and draws well. Flushed with 44ml NS.

## 2019-03-17 NOTE — Telephone Encounter (Signed)
Oncology Nurse Navigator Documentation  Received call from Mr. Tippin' wife [name].  She reported isolated weekend episode when husband "tried to get out of bed to go to bathroom but couldn't move, felt hot".   He was able to mobilize after he was cooled by fans, has been fine since.  She asked if RT or chemotherapy could have caused this event to which I indicated unlikely.  She stated concern he has not been taking his daily dose of Plavix per ENT guidance a couple of weeks ago s/p throat bleeding episode.  I encouraged her to call PCP ASAP re Plavix followed by call to [facility] Dr. Tera Helper to inform.  I asked her to call me s/p these calls to provide update.  She agreed to do so.  [name], RN, BSN Head & Neck Oncology Nurse [place] [place] [phone]   .

## 2019-03-17 NOTE — Telephone Encounter (Signed)
Pt wife called to touch base on the pt having seizures and his medications states his BP is low and he is not on any BP medication. States patient is sweating excessively and is pale. States pt was taken off of his plavix by his throat dr due to coughing leading to the pt blowing a vessel in his throat. Pt.wife is concerned he doesn't have proper blood flow

## 2019-03-17 NOTE — ED Notes (Signed)
Date and time results received: 03/17/19 1644 (use smartphrase ".now" to insert current time)  Test: Lactic Critical Value: 3.7  Name of Provider Notified: Sabra Heck  Orders Received? Or Actions Taken?: Orders Received - See Orders for details

## 2019-03-17 NOTE — Progress Notes (Signed)
Notified bedside nurse of need to draw repeat lactic acid. 

## 2019-03-17 NOTE — ED Provider Notes (Signed)
Medical screening examination/treatment/procedure(s) were conducted as a shared visit with non-physician practitioner(s) and myself.  I personally evaluated the patient during the encounter.  Clinical Impression:   Final diagnoses:  Community acquired pneumonia of right lower lobe of lung  Orthostatic hypotension  Sepsis, due to unspecified organism, unspecified whether acute organ dysfunction present [facility])    This patient is a 59 year old male, he has a history of hypertension hypercholesterolemia, he has had a prior stroke, he has a history of a supraglottic malignant neoplasm for which he is getting chemo and radiation, he has a tracheostomy and a gastrostomy, on my exam the patient has a soft nontender abdomen, clear lung sounds, he has no murmur, no edema, no rashes, soft nontender abdomen but is tachycardic to about 115 bpm with a temperature of 102.5.  The patient states he has had some chills and decreased appetite has been getting worse over the last couple of days.  Labs and x-ray pending, the x-ray shows no obvious infiltrate, hemoglobin is at baseline 10.3, no leukocytosis.  His metabolic panel does show a worsening hyponatremia at 127 but no significant renal dysfunction.  He has been given IV fluids, will add antibiotics, code sepsis will be activated, cultures, lactic acid, anticipate admission.   Noemi Chapel, MD 03/17/19 [phone]

## 2019-03-17 NOTE — ED Provider Notes (Signed)
[facility] EMERGENCY DEPARTMENT Provider Note  CSN: [id number] Arrival date & time: 03/17/19  1232     History Chief Complaint  Patient presents with  . Hypotension    [name] is a 59 y.o. 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  Patient states he feels fatigued, somewhat generally weak, with intermittent lightheadedness. States lightheadedness occurs mostly when up and moving around or transitioning positions.  No other alleviating or aggravating factors.  He had been eating and drinking fairly well over the past few days (via PO, not utilizing gastrostomy at this time) but had decreased appetite today.  He mentions some chills over the past few days as well, but none at present. He states his wife was concerned that he appears pale with his lightheadedness episodes and does get a bit shaky- no seizure activity though.  He has his baseline cough, no acute change. Denies fever, dyspnea, chest pain, abdominal pain, nausea, vomiting, diarrhea, melena, hematochezia, bleeding from tracheostomy or gastrostomy site, dysuria, syncope,  focal numbness/weakness, dizziness like the room spinning, headache, visual disturbance, or rashes.  He states that his last chemo/radiation session was this previous Friday (03/14/19).  He states that he called his oncologist office and there was concern for possible blood infection.  Denies recent medication changes.  HPI     Past Medical History:  Diagnosis Date  . Anxiety   . Arthritis    back  . Bradycardia    a. During 11/2012 adm: lopressor decreased.  . Cancer of larynx ([place])   . Carotid artery stenosis   . Chronic lower back pain    "I work Architect; messed back up ~ 30 yr  ago; paralyzed for 2 days then" (11/05/2012)  . Coronary artery disease    a. Diag cath 10/2012 for CP/abnormal nuc -> planned PCI s/p LAD atherectomy/DES placement 11/05/12.  Marland Kitchen GERD (gastroesophageal reflux disease)   . History of hiatal hernia   . Hypercholesteremia   . Hypertension   . Stroke ([place]) 07/31/2018   bilateral vertebrobasilar occlusion; "left side weaker thanit was before."    Patient Active Problem List   Diagnosis Date Noted  . Hx of tracheostomy 02/18/2019  . Head and neck cancer ([place]) 02/18/2019  . Malignant neoplasm of supraglottis ([place]) 01/22/2019  . Stroke ([place]) 07/31/2018  . Rotator cuff syndrome of right shoulder 02/20/2013  . Bradycardia 11/06/2012  . Coronary artery disease   . Abnormal finding on cardiovascular stress test 09/25/2012  . Hypertension 09/03/2012  . Chest pain 09/03/2012  . Alcohol abuse, daily use 09/03/2012  . Hyperlipidemia 09/03/2012  . Hyponatremia 09/03/2012    Past Surgical History:  Procedure Laterality Date  . CARDIAC CATHETERIZATION  10/29/2012  . CORONARY ANGIOPLASTY WITH STENT PLACEMENT  11/05/2012  . DIRECT LARYNGOSCOPY N/A 01/02/2019   Procedure: MICRO DIRECT LARYNGOSCOPY BIOPSY OF LARYNGEAL MASS;  Surgeon: [name], MD;  Location: [place] [facility];  Service: ENT;  Laterality: N/A;  . HEMORRHOID SURGERY  ~ 2010  . INSERTION OF MESH N/A 06/02/2015   Procedure: INSERTION OF MESH;  Surgeon: [name], MD;  Location: [facility];  Service: General;  Laterality: N/A;  . IR ANGIO INTRA EXTRACRAN SEL COM CAROTID INNOMINATE BILAT MOD SED  08/02/2018  . IR ANGIO VERTEBRAL SEL VERTEBRAL BILAT MOD SED  08/02/2018  .  LAPAROSCOPIC INSERTION GASTROSTOMY TUBE N/A 02/24/2019   Procedure: LAPAROSCOPIC INSERTION GASTROSTOMY TUBE;  Surgeon: [name], MD;  Location: [place];  Service: General;  Laterality: N/A;  . LEFT HEART CATHETERIZATION WITH CORONARY ANGIOGRAM N/A 10/30/2012   Procedure: LEFT HEART CATHETERIZATION WITH CORONARY ANGIOGRAM;  Surgeon:  [name], MD;  Location: [facility];  Service: Cardiovascular;  Laterality: N/A;  . MULTIPLE EXTRACTIONS WITH ALVEOLOPLASTY N/A 02/18/2019   Procedure: Extraction of tooth #'s 5-7, 10,11,14,20-29, 31 and 32 with alveoloplasty and maxiillary left buccal exostoses reductions;  Surgeon: [name], [name];  Location: [place];  Service: Oral Surgery;  Laterality: N/A;  . PERCUTANEOUS CORONARY ROTOBLATOR INTERVENTION (PCI-R) N/A 11/05/2012   Procedure: PERCUTANEOUS CORONARY ROTOBLATOR INTERVENTION (PCI-R);  Surgeon: [name], MD;  Location: [facility];  Service: Cardiovascular;  Laterality: N/A;  . TRACHEOSTOMY TUBE PLACEMENT N/A 02/18/2019   Procedure: Tracheostomy;  Surgeon: [name], MD;  Location: [place];  Service: ENT;  Laterality: N/A;  . UMBILICAL HERNIA REPAIR N/A 06/02/2015   Procedure: UMBILICAL HERNIORRHAPHY WITH MESH;  Surgeon: [name], MD;  Location: [facility];  Service: General;  Laterality: N/A;       Family History  Problem Relation Age of Onset  . Hypertension Mother   . Heart disease Father   . Hypertension Father   . Heart disease Sister   . Hypertension Maternal Grandmother   . Hypertension Maternal Grandfather   . Hypertension Paternal Grandmother   . Hypertension Paternal Grandfather     Social History   Tobacco Use  . Smoking status: Former Smoker    Packs/day: 3.00    Years: 32.00    Pack years: 96.00    Types: Cigarettes    Start date: 08/29/1977    Quit date: 11/29/2006    Years since quitting: 12.3  . Smokeless tobacco: Former Systems developer    Types: Chew  . Tobacco comment: 11/05/2012 "chewed tobacco when I play ball; aien't chewed since age 33"  Substance Use Topics  . Alcohol use: Yes    Alcohol/week: 12.0 standard drinks    Types: 12 Cans of beer per week    Comment: Currently 3-4 beers per day. 02/13/19, was a heavy drinker in the past, - states he could drink a case a day   . Drug use: Yes    Types: Marijuana    Home Medications Prior  to Admission medications   Medication Sig Start Date End Date Taking? Authorizing Provider  amLODipine (NORVASC) 5 MG tablet Take 1 tablet (5 mg total) by mouth daily. 12/19/18   Frann Rider, NP  aspirin EC 81 MG tablet Take 81 mg by mouth daily.    [provider]  atorvastatin (LIPITOR) 80 MG tablet Take 1 tablet (80 mg total) by mouth every evening. 10/23/17   Herminio Commons, MD  Catheters (ARGYLE SUCTION CATHETER 14FR) MISC 1 Device by Does not apply route daily. 02/25/19   Harold Hedge, MD  CISPLATIN IV Inject into the vein once a week. Weekly in conjunction with radiation therapy beginning 03/06/2019 03/04/19   [provider]  clopidogrel (PLAVIX) 75 MG tablet Take 1 tablet (75 mg total) by mouth daily. Patient not taking: Reported on 02/17/2019 12/24/18   Frann Rider, NP  DULoxetine (CYMBALTA) 30 MG capsule Take 30 mg by mouth daily. 02/25/19   [provider]  HYDROcodone-acetaminophen (NORCO) 10-325 MG tablet Take 1 tablet by mouth every 4 (four) hours as needed. Patient not taking: Reported on  03/06/2019 06/02/15   [name], MD  lidocaine (XYLOCAINE) 2 % solution Patient: Mix 1part 2% viscous lidocaine, 1part H20. Swish & swallow 48m of diluted mixture, 344m before meals and at bedtime, up to QID 03/10/19   SqEppie GibsonMD  lidocaine-prilocaine (EMLA) cream Apply 1 application topically as needed. Apply small amount to port site and cover with plastic wrap one hour before appointment. Patient not taking: Reported on 03/13/2019 02/27/19   KaDerek JackMD  metoprolol tartrate (LOPRESSOR) 25 MG tablet TAKE 1/2 TABLET BY MOUTH TWICE DAILY Patient taking differently: Take 12.5 mg by mouth 2 (two) times daily.  01/25/15   KoHerminio CommonsMD  Misc. Devices MISC 10 mL saline flushes - one box 03/03/19   KaDerek JackMD  MONOJECT PREFILL ADVANCED NACL 0.9 % SOLN injection USE AS DIRECTED.A 03/03/19   [provider]  naproxen  (NAPROSYN) 250 MG tablet Take 250 mg by mouth 2 (two) times daily. 02/25/19   [provider]  nitroGLYCERIN (NITROSTAT) 0.4 MG SL tablet Place 1 tablet (0.4 mg total) under the tongue every 5 (five) minutes as needed for chest pain (CP or SOB). Patient not taking: Reported on 02/17/2019 07/27/14   KoHerminio CommonsMD  omeprazole (PRILOSEC) 20 MG capsule Take 20 mg by mouth daily.  07/22/18   [provider]  prochlorperazine (COMPAZINE) 10 MG tablet Take 1 tablet (10 mg total) by mouth every 6 (six) hours as needed (Nausea or vomiting). Patient not taking: Reported on 03/06/2019 03/06/19   KaDerek JackMD  sodium chloride irrigation 0.9 % irrigation Irrigate with 200 mLs as directed continuous. 02/25/19   SeHarold HedgeMD  Tracheostomy Care KIT 1 kit by Does not apply route daily. 02/25/19   SeHarold HedgeMD  traMADol (ULTRAM) 50 MG tablet Take 50-100 mg by mouth 4 (four) times daily as needed (pain.).  01/29/19   [provider]    Allergies    Prednisone  Review of Systems   Review of Systems  Constitutional: Positive for appetite change, chills and fatigue. Negative for fever.  Respiratory: Positive for cough (baseline no change). Negative for shortness of breath.   Cardiovascular: Negative for chest pain.  Gastrointestinal: Negative for abdominal pain, blood in stool, constipation, diarrhea, nausea and vomiting.  Genitourinary: Negative for dysuria, frequency and hematuria.  Neurological: Positive for light-headedness. Negative for dizziness, seizures, syncope and speech difficulty.  All other systems reviewed and are negative.  Physical Exam Updated Vital Signs BP 107/64 (BP Location: Left Arm)   Pulse 75   Temp 97.7 F (36.5 C) (Oral)   Resp 15   Ht _0  (1.727 m)   Wt 76.2 kg   SpO2 96%   BMI 25.54 kg/m   Physical Exam Vitals and nursing note reviewed.  Constitutional:      General: He is not in acute distress.    Appearance: Normal  appearance. He is not toxic-appearing.  HENT:     Head: Normocephalic and atraumatic.     Mouth/Throat:     Mouth: Mucous membranes are dry.     Pharynx: Oropharynx is clear. Uvula midline.  Eyes:     General: Vision grossly intact. Gaze aligned appropriately.     Extraocular Movements: Extraocular movements intact.     Pupils: Pupils are equal, round, and reactive to light.  Neck:     Comments: Tracheostomy present with some secretions externally.  No bleeding. Cardiovascular:     Rate and Rhythm: Normal  rate and regular rhythm.     Pulses:          Radial pulses are 2+ on the right side and 2+ on the left side.  Pulmonary:     Effort: Pulmonary effort is normal.     Breath sounds: Normal breath sounds.  Abdominal:     General: There is no distension.     Palpations: Abdomen is soft.     Tenderness: There is no abdominal tenderness. There is no guarding or rebound.     Comments: Gastrostomy tube present in the upper abdomen, no surrounding erythema, warmth, or purulent drainage.  No bleeding.  Musculoskeletal:     Cervical back: Normal range of motion and neck supple. No rigidity.     Right lower leg: No edema.     Left lower leg: No edema.  Skin:    General: Skin is warm and dry.     Findings: No rash.  Neurological:     Mental Status: He is alert.     Comments: Alert. Clear speech. No facial droop. CNIII-XII grossly intact. Bilateral upper and lower extremities' sensation grossly intact. 5/5 symmetric strength with grip strength and with plantar and dorsi flexion bilaterally . Normal finger to nose bilaterally. Negative pronator drift. Gait intact.    Psychiatric:        Mood and Affect: Mood normal.        Behavior: Behavior normal.     ED Results / Procedures / Treatments   Labs (all labs ordered are listed, but only abnormal results are displayed) Labs Reviewed  CBC WITH DIFFERENTIAL/PLATELET - Abnormal; Notable for the following components:      Result Value    RBC 3.33 (*)    Hemoglobin 10.3 (*)    HCT 31.9 (*)    Lymphs Abs 0.2 (*)    All other components within normal limits  COMPREHENSIVE METABOLIC PANEL - Abnormal; Notable for the following components:   Sodium 127 (*)    Chloride 92 (*)    Glucose, Bld 136 (*)    Calcium 8.6 (*)    AST 44 (*)    All other components within normal limits  CULTURE, BLOOD (ROUTINE X 2)  CULTURE, BLOOD (ROUTINE X 2)  URINE CULTURE  URINALYSIS, ROUTINE W REFLEX MICROSCOPIC  LACTIC ACID, PLASMA  LACTIC ACID, PLASMA  APTT  PROTIME-INR  POC SARS CORONAVIRUS 2 AG -  ED    EKG None  Radiology DG Chest Portable 1 View  Result Date: 03/17/2019 CLINICAL DATA:  Fever. EXAM: PORTABLE CHEST 1 VIEW COMPARISON:  Radiograph 03/04/2019 FINDINGS: Tracheostomy tube tip at the thoracic inlet. Right upper extremity central line tip in the SVC. Streaky opacities at the right lung base are new from prior exam. Unchanged heart size and mediastinal contours. No pulmonary edema or pneumothorax. No large pleural effusion. No acute osseous abnormalities are seen. IMPRESSION: Streaky right lung base opacities, new from radiographs earlier this month, favoring atelectasis but could represent pneumonia in the setting of fever. Electronically Signed   By: Keith Rake M.D.   On: 03/17/2019 15:54    Procedures .Critical Care Performed by: Amaryllis Dyke, PA-C Authorized by: Amaryllis Dyke, PA-C     (including critical care time)  CRITICAL CARE Performed by: Kennith Maes   Total critical care time: 30 minutes  Critical care time was exclusive of separately billable procedures and treating other patients.  Critical care was necessary to treat or prevent imminent or life-threatening deterioration.  Critical care was time spent personally by me on the following activities: development of treatment plan with patient and/or surrogate as well as nursing, discussions with consultants, evaluation of  patient's response to treatment, examination of patient, obtaining history from patient or surrogate, ordering and performing treatments and interventions, ordering and review of laboratory studies, ordering and review of radiographic studies, pulse oximetry and re-evaluation of patient's condition.    Medications Ordered in ED Medications  azithromycin (ZITHROMAX) 500 mg in sodium chloride 0.9 % 250 mL IVPB (has no administration in time range)  ceFEPIme (MAXIPIME) 2 g in sodium chloride 0.9 % 100 mL IVPB (has no administration in time range)  sodium chloride 0.9 % bolus 1,000 mL (0 mLs Intravenous Stopped 03/17/19 1606)  acetaminophen (TYLENOL) tablet 650 mg (650 mg Oral Given 03/17/19 1522)    ED Course  I have reviewed the triage vital signs and the nursing notes.  Pertinent labs & imaging results that were available during my care of the patient were reviewed by me and considered in my medical decision making (see chart for details).    [name] was evaluated in Emergency Department on 03/17/2019 for the symptoms described in the history of present illness. He/she was evaluated in the context of the global COVID-19 pandemic, which necessitated consideration that the patient might be at risk for infection with the SARS-CoV-2 virus that causes COVID-19. Institutional protocols and algorithms that pertain to the evaluation of patients at risk for COVID-19 are in a state of rapid change based on information released by regulatory bodies including the CDC and federal and state organizations. These policies and algorithms were followed during the patient's care in the ED.  MDM Rules/Calculators/A&P                      Patient presents to the emergency department with complaints of fatigue and intermittent lightheadedness especially with position changes over the past few days. Has also had some chills. Currently on chemo/radiation. Patient is nontoxic, resting comfortably, initial vitals at  rest are WNL.  He has no focal neurologic deficits on exam.  Mucous membranes are somewhat dry.  Exam is otherwise benign.  Plan for labs to include blood cultures and orthostatic vital signs. Attempted to call patient's wife via telephone with his consent, no answer currently. Additional history per chart review.   Labs and imaging personally reviewed and interpreted. CBC: Mild anemia improved from prior.  No leukocytosis.  No neutropenia. CMP: Mild electrolyte abnormalities including hyponatremia, hypochloremia, and hypocalcemia.  Renal function within normal limits but creatinine of 1.12 is mildly elevated compared to most recent creatinine of 0.88 on record. Rapid Covid test: Negative  Orthostatic VS for the past 24 hrs:  BP- Lying Pulse- Lying BP- Sitting Pulse- Sitting BP- Standing at 0 minutes Pulse- Standing at 0 minutes  03/17/19 1430 120/77 66 (!) 89/69 87 (!) 65/53 89   Patient with orthostatic hypotension, fluids ordered.  On reassessment patient has an oral temperature of 100, subsequent rectal temp obtained is 102.5-we will give Tylenol and obtain lactic acid and chest x-ray. Also noted to be tachycardic to 112 on subsequent re-evaluation, at this time will call code sepsis and start broad-spectrum antibiotics.  No source currently.   EKG w/ sinus tachycardia- no STEMI.  CXR: Streaky right lung base opacities, new from radiographs earlier this month, favoring atelectasis but could represent pneumonia in the setting of fever.--> cefepime has been started, have not given vanc/flagyl yet  for initial unknown source- will discontinue and add azithromycin for more focused abx treatment. Consult placed for admission.   This is a shared visit with supervising physician Dr. Sabra Heck who has independently evaluated patient & provided guidance in evaluation/management/disposition, in agreement with care   16:30: CONSULT: Discussed with hospitalist Dr. Linda Hedges- accepts admission.   Final  Clinical Impression(s) / ED Diagnoses Final diagnoses:  Community acquired pneumonia of right lower lobe of lung  Orthostatic hypotension  Sepsis, due to unspecified organism, unspecified whether acute organ dysfunction present [facility])    Rx / DC Orders ED Discharge Orders    None       Amaryllis Dyke, PA-C 03/17/19 1634    Noemi Chapel, MD 03/17/19 1635

## 2019-03-17 NOTE — ED Notes (Signed)
Attempted to get orthostatics, Pt was unable to stand after the third set of vitals. Pt became unsteady and complained of leg weakness but stated he had no dizziness.   Pt is aware a urine sample is needed, call bell and urinal at bedside.

## 2019-03-18 ENCOUNTER — Other Ambulatory Visit (HOSPITAL_COMMUNITY): Payer: 59

## 2019-03-18 ENCOUNTER — Ambulatory Visit (HOSPITAL_COMMUNITY): Payer: 59 | Admitting: Hematology

## 2019-03-18 ENCOUNTER — Ambulatory Visit (HOSPITAL_COMMUNITY): Payer: 59

## 2019-03-18 ENCOUNTER — Encounter: Payer: Self-pay | Admitting: *Deleted

## 2019-03-18 ENCOUNTER — Ambulatory Visit: Payer: 59

## 2019-03-18 ENCOUNTER — Inpatient Hospital Stay (HOSPITAL_COMMUNITY): Payer: 59

## 2019-03-18 DIAGNOSIS — I10 Essential (primary) hypertension: Secondary | ICD-10-CM

## 2019-03-18 DIAGNOSIS — J189 Pneumonia, unspecified organism: Secondary | ICD-10-CM

## 2019-03-18 LAB — BLOOD CULTURE ID PANEL (REFLEXED)

## 2019-03-18 LAB — CBC WITH DIFFERENTIAL/PLATELET
Abs Immature Granulocytes: 0.05 10*3/uL (ref 0.00–0.07)
Basophils Absolute: 0 10*3/uL (ref 0.0–0.1)
Basophils Relative: 0 %
Eosinophils Absolute: 0 10*3/uL (ref 0.0–0.5)
Eosinophils Relative: 1 %
HCT: 25.5 % — ABNORMAL LOW (ref 39.0–52.0)
Hemoglobin: 8.4 g/dL — ABNORMAL LOW (ref 13.0–17.0)
Immature Granulocytes: 1 %
Lymphocytes Relative: 5 %
Lymphs Abs: 0.3 10*3/uL — ABNORMAL LOW (ref 0.7–4.0)
MCH: 31.2 pg (ref 26.0–34.0)
MCHC: 32.9 g/dL (ref 30.0–36.0)
MCV: 94.8 fL (ref 80.0–100.0)
Monocytes Absolute: 0.4 10*3/uL (ref 0.1–1.0)
Monocytes Relative: 6 %
Neutro Abs: 5.2 10*3/uL (ref 1.7–7.7)
Neutrophils Relative %: 87 %
Platelets: 168 10*3/uL (ref 150–400)
RBC: 2.69 MIL/uL — ABNORMAL LOW (ref 4.22–5.81)
RDW: 13 % (ref 11.5–15.5)
WBC: 5.9 10*3/uL (ref 4.0–10.5)
nRBC: 0 % (ref 0.0–0.2)

## 2019-03-18 LAB — BASIC METABOLIC PANEL
Anion gap: 9 (ref 5–15)
BUN: 18 mg/dL (ref 6–20)
CO2: 25 mmol/L (ref 22–32)
Calcium: 8.4 mg/dL — ABNORMAL LOW (ref 8.9–10.3)
Chloride: 99 mmol/L (ref 98–111)
Creatinine, Ser: 0.92 mg/dL (ref 0.61–1.24)
GFR calc Af Amer: >60 mL/min (ref >60)
GFR calc non Af Amer: >60 mL/min (ref >60)
Glucose, Bld: 117 mg/dL — ABNORMAL HIGH (ref 70–99)
Potassium: 4.4 mmol/L (ref 3.5–5.1)
Sodium: 133 mmol/L — ABNORMAL LOW (ref 135–145)

## 2019-03-18 LAB — URINE CULTURE: Culture: <10000 — AB

## 2019-03-18 LAB — HIV ANTIBODY (ROUTINE TESTING W REFLEX): HIV Screen 4th Generation wRfx: NONREACTIVE

## 2019-03-18 LAB — PROCALCITONIN: Procalcitonin: 20.22 ng/mL

## 2019-03-18 LAB — SARS CORONAVIRUS 2 (TAT 6-24 HRS): SARS Coronavirus 2: NEGATIVE

## 2019-03-18 MED ORDER — CHLORHEXIDINE GLUCONATE CLOTH 2 % EX PADS: 6.0000 | MEDICATED_PAD | Freq: Every day | CUTANEOUS | Status: DC

## 2019-03-18 MED ORDER — LEVOFLOXACIN 750 MG PO TABS: 750.0000 mg | ORAL_TABLET | Freq: Every day | ORAL | 0 refills | Status: AC

## 2019-03-18 MED ORDER — ORAL CARE MOUTH RINSE: 15.0000 mL | Freq: Two times a day (BID) | OROMUCOSAL | Status: DC

## 2019-03-18 MED ORDER — CHLORHEXIDINE GLUCONATE 0.12 % MT SOLN
15.0000 mL | Freq: Two times a day (BID) | OROMUCOSAL | Status: DC
  Administered 2019-03-18: 15 mL via OROMUCOSAL
  Filled 2019-03-18 (×2): qty 15

## 2019-03-18 NOTE — Progress Notes (Signed)
Another visit made to patients room to do trach check.  Patient states he don't wear humidification or oxygen with trach.  He said he is able to clear his secretions on his own and don't require suctioning at this time.  Patient has a extra trach and ambu bag present in the room.

## 2019-03-18 NOTE — Progress Notes (Signed)
PHARMACY - PHYSICIAN COMMUNICATION CRITICAL VALUE ALERT - BLOOD CULTURE IDENTIFICATION (BCID)  [name] is an 59 y.o. male who presented to [facility] on 03/17/2019 with a chief complaint of weakness.  Assessment:  Patient admitted with weakness, palor, and light-headedness. Diagnosis  for treatment of CAP in an immunocompromised patient. Improved and afebrile this AM.   Name of physician (or Provider) Contacted: Dr. Manuella Ghazi  Current antibiotics: Ceftriaxone 2gm IV q24  Changes to prescribed antibiotics recommended:  Patient is on recommended antibiotics - No changes needed .   Results for orders placed or performed during the hospital encounter of 03/17/19  Blood Culture ID Panel (Reflexed) (Collected: 03/17/2019  1:37 PM)  Result Value Ref Range   Enterococcus species NOT DETECTED NOT DETECTED   Listeria monocytogenes NOT DETECTED NOT DETECTED   Staphylococcus species NOT DETECTED NOT DETECTED   Staphylococcus aureus (BCID) NOT DETECTED NOT DETECTED   Streptococcus species NOT DETECTED NOT DETECTED   Streptococcus agalactiae NOT DETECTED NOT DETECTED   Streptococcus pneumoniae NOT DETECTED NOT DETECTED   Streptococcus pyogenes NOT DETECTED NOT DETECTED   Acinetobacter baumannii NOT DETECTED NOT DETECTED   Enterobacteriaceae species DETECTED (A) NOT DETECTED   Enterobacter cloacae complex NOT DETECTED NOT DETECTED   Escherichia coli NOT DETECTED NOT DETECTED   Klebsiella oxytoca NOT DETECTED NOT DETECTED   Klebsiella pneumoniae NOT DETECTED NOT DETECTED   Proteus species NOT DETECTED NOT DETECTED   Serratia marcescens DETECTED (A) NOT DETECTED   Carbapenem resistance NOT DETECTED NOT DETECTED   Haemophilus influenzae NOT DETECTED NOT DETECTED   Neisseria meningitidis NOT DETECTED NOT DETECTED   Pseudomonas aeruginosa NOT DETECTED NOT DETECTED   Candida albicans NOT DETECTED NOT DETECTED   Candida glabrata NOT DETECTED NOT DETECTED   Candida krusei NOT DETECTED NOT DETECTED    Candida parapsilosis NOT DETECTED NOT DETECTED   Candida tropicalis NOT DETECTED NOT DETECTED   [name], BS [place], BCPS Clinical Pharmacist Pager [phone] 03/18/2019  10:28 AM

## 2019-03-18 NOTE — Discharge Summary (Signed)
Physician Discharge Summary  [name] XBD:[id number] DOB: 1960-09-26 DOA: 03/17/2019  PCP: [name], MD  Admit date: 03/17/2019  Discharge date: 03/18/2019  Admitted From:Home  Disposition:  Home  Recommendations for Outpatient Follow-up:  1. Follow up with PCP in 1-2 weeks 2. Continue on Levaquin as prescribed for Serratia bacteremia for 7 days to complete course of treatment as well as for treatment of pneumonia 3. Continue on other home medications as prior  Home Health: None  Equipment/Devices: None  Discharge Condition: Stable  CODE STATUS: Full  Diet recommendation: Heart Healthy  Brief/Interim Summary: Per HPI: [name] is a 59 y.o. 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 He is s/ptracheostomy after intubation injury during dental extraction and gastrostomy tube placement 2/2 altered gastric anatomy. He presents to the emergency department today forfatigueand intermittent lightheadedness over the past few days. Patient states he feels fatigued, somewhat generally weak, with intermittent lightheadedness. States lightheadedness occurs mostly when up and moving around ortransitioning positions. No other alleviating or aggravating factors. He hadbeen eating and drinking fairly well over the past few days (via PO, not utilizing gastrostomy at this time)but had decreased appetite today. He mentions some chills over the past few days as well, but none at present. He states his wife was concerned that he appears pale with his lightheadedness episodesand does get a bit shaky.He has a baseline cough w/o acute change.Denies fever, dyspnea, chest pain, abdominal pain, nausea, vomiting, diarrhea, melena, hematochezia, bleeding from tracheostomy or gastrostomy site, dysuria, syncope,focal  numbness/weakness, dizziness like the room spinning, headache,visual disturbance, or rashes. He states that his last chemo/radiation session was this previous Friday (03/14/19).He states that he called his oncologist office and there was concern for possible blood infection. Denies recent medication changes.  3/16: Patient was admitted with community-acquired pneumonia of the right lower lung base.  He is immunocompromised and and chemotherapy.  He was started on Rocephin and azithromycin and is noted to have 2 blood cultures positive for Serratia bacteremia.  He has PICC line without any signs of infection surrounding the area as well as trach and PEG.  He is not hypoxemic and appears to be back to his baseline level of functioning.  He is eager to go home and it appears that he would be a good candidate to remain on oral Levaquin for the next 7 days as prescribed to complete course of treatment for pneumonia and bacteremia.  Follow-up with PCP in the next 1-2 weeks.  Discharge Diagnoses:  Active Problems:   Hypertension   Malignant neoplasm of supraglottis ([place])   RLL pneumonia  Principal discharge diagnosis: Right lower lobe community-acquired pneumonia along with Serratia bacteremia.  Discharge Instructions  Discharge Instructions    Diet - low sodium heart healthy   Complete by: As directed    Increase activity slowly   Complete by: As directed      Allergies as of 03/18/2019      Reactions   Prednisone Other (See Comments)   Chest pain      Medication List    TAKE these medications   amLODipine 5 MG tablet Commonly known as: NORVASC Take 1 tablet (5 mg total) by mouth daily.   [place] Suction Catheter 14FR Misc 1 Device by Does not apply route daily.   aspirin EC 81 MG tablet Take 81 mg by mouth daily.   atorvastatin 80  MG tablet Commonly known as: LIPITOR Take 1 tablet (80 mg total) by mouth every evening.   CISPLATIN IV Inject into the vein once a week. Weekly  in conjunction with radiation therapy beginning 03/06/2019   clopidogrel 75 MG tablet Commonly known as: PLAVIX Take 1 tablet (75 mg total) by mouth daily.   DULoxetine 30 MG capsule Commonly known as: CYMBALTA Take 30 mg by mouth daily.   HYDROcodone-acetaminophen 10-325 MG tablet Commonly known as: NORCO Take 1 tablet by mouth every 4 (four) hours as needed.   levofloxacin 750 MG tablet Commonly known as: Levaquin Take 1 tablet (750 mg total) by mouth daily for 7 days.   lidocaine 2 % solution Commonly known as: XYLOCAINE Patient: Mix 1part 2% viscous lidocaine, 1part H20. Swish & swallow 7m of diluted mixture, 333m before meals and at bedtime, up to QID   lidocaine-prilocaine cream Commonly known as: EMLA Apply 1 application topically as needed. Apply small amount to port site and cover with plastic wrap one hour before appointment.   metoprolol tartrate 25 MG tablet Commonly known as: LOPRESSOR TAKE 1/2 TABLET BY MOUTH TWICE DAILY   Misc. Devices Misc 10 mL saline flushes - one box   MONOJECT PREFILL ADVANCED NACL 0.9 % Soln injection Generic drug: sodium chloride flush USE AS DIRECTED.A   naproxen 250 MG tablet Commonly known as: NAPROSYN Take 250 mg by mouth 2 (two) times daily.   nitroGLYCERIN 0.4 MG SL tablet Commonly known as: NITROSTAT Place 1 tablet (0.4 mg total) under the tongue every 5 (five) minutes as needed for chest pain (CP or SOB).   omeprazole 20 MG capsule Commonly known as: PRILOSEC Take 20 mg by mouth daily.   prochlorperazine 10 MG tablet Commonly known as: COMPAZINE Take 1 tablet (10 mg total) by mouth every 6 (six) hours as needed (Nausea or vomiting).   sodium chloride irrigation 0.9 % irrigation Irrigate with 200 mLs as directed continuous.   Tracheostomy Care Kit 1 kit by Does not apply route daily.   traMADol 50 MG tablet Commonly known as: ULTRAM Take 50-100 mg by mouth 4 (four) times daily as needed (pain.).       Follow-up Information    FuRedmond SchoolMD Follow up in 1 week(s).   Specialty: Internal Medicine Contact information: [phone]        Allergies  Allergen Reactions  . Prednisone Other (See Comments)    Chest pain    Consultations:  None   Procedures/Studies: CT ABDOMEN WO CONTRAST  Result Date: 02/20/2019 CLINICAL DATA:  Laryngeal squamous carcinoma and evaluation for gastrostomy tube placement prior to starting therapy. EXAM: CT ABDOMEN WITHOUT CONTRAST TECHNIQUE: Multidetector CT imaging of the abdomen was performed following the standard protocol without IV contrast. COMPARISON:  PET scan on 01/27/2019 FINDINGS: Lower chest: Atelectasis and scarring at both lung bases. Hepatobiliary: No focal liver abnormality is seen. No gallstones, gallbladder wall thickening, or biliary dilatation. Pancreas: Unremarkable. No pancreatic ductal dilatation or surrounding inflammatory changes. Spleen: Normal in size without focal abnormality. Adrenals/Urinary Tract: Adrenal glands are unremarkable. Kidneys are normal, without renal calculi, focal lesion, or hydronephrosis. Stomach/Bowel: No hiatal hernia. The stomach has a horizontal lie, is fairly deeply located and consistently is situated directly behind the transverse colon on both the current CT as well as the prior PET scan. This makes the patient a poor candidate for percutaneous gastrostomy even after gastric insufflation. There are some mildly dilated anterior small  bowel loops inferior to the transverse colon suggestive of a component of mild ileus. Other small bowel loops are not dilated. No evidence of free air or abnormal fluid collection in the abdomen. Vascular/Lymphatic: Aortic atherosclerosis with calcified plaque present. The infrarenal aorta just below the IMA origin shows slight bulge measuring up to 2.6 cm in diameter. This is not overtly aneurysmal. No enlarged abdominal or  pelvic lymph nodes. Other:  No ascites or body wall edema. No abdominal wall hernia. Musculoskeletal: No acute or significant osseous findings. IMPRESSION: 1. The stomach has a horizontal lie directly behind the transverse colon on both the current CT and prior PET scan. This makes the patient a poor candidate for percutaneous gastrostomy even after gastric insufflation. Recommend surgical consultation for possible surgical gastrostomy versus jejunostomy. 2. There are some mildly dilated anterior small bowel loops inferior to the transverse colon suggestive of a component of mild ileus. No evidence of bowel obstruction, free air or abnormal fluid collection in the abdomen. 3. Aortic atherosclerosis with slight bulge of the infrarenal aorta measuring up to 2.6 cm in diameter. This is not overtly aneurysmal. Recommend follow-up abdominal ultrasound in 3 years. This recommendation follows ACR consensus guidelines: White Paper of the ACR Incidental Findings Committee II on Vascular Findings. J Am Coll Radiol 2013; 10: Aortic Atherosclerosis (ICD10-I70.0). Electronically Signed   By: Aletta Edouard M.D.   On: 02/20/2019 08:43   CT Angio Neck W and/or Wo Contrast  Result Date: 03/04/2019 CLINICAL DATA:  Penetrating neck trauma. Tracheostomy tube in place. Patient awoke with hemoptysis today. Neck cancer. EXAM: CT ANGIOGRAPHY NECK TECHNIQUE: Multidetector CT imaging of the neck was performed using the standard protocol during bolus administration of intravenous contrast. Multiplanar CT image reconstructions and MIPs were obtained to evaluate the vascular anatomy. Carotid stenosis measurements (when applicable) are obtained utilizing NASCET criteria, using the distal internal carotid diameter as the denominator. CONTRAST:  178m OMNIPAQUE IOHEXOL 350 MG/ML SOLN COMPARISON:  CT neck 12/26/2018 FINDINGS: Aortic arch: Atherosclerotic calcifications are again noted at the arch and origins the great vessels without a  significant stenosis relative to the more distal vessels. Aneurysm is present. Right carotid system: Mural calcifications are present in the right common carotid artery. Circumferential calcifications are present at the carotid bifurcation. The lumen is narrowed 2 mm. This compares with a more distal segment of 4.4 mm. Left carotid system: Mural calcifications are present in the left common carotid artery without significant stenosis. More prominent calcifications are present at the bifurcation without. Dense lateral calcifications are present in the proximal left ICA the lumen is narrowed to 2.2 mm. This compares with a more normal distal lumen 5.1 mm. Extensive calcifications are present in the distal left ICA at the skull base. Lumen is narrowed to less than 1 mm is. Incidental imaging of the cavernous internal carotid artery demonstrates moderate to high-grade stenoses bilaterally. Lumen is narrowed to approximately 1 mm on both sides. ICA termini and proximal A1 and M1 segments are normal. Vertebral arteries: The vertebral arteries demonstrate proximal calcifications bilaterally without significant stenosis. Medial mural calcification is present in the left vertebral artery at C4-C5 and C6 without significant stenosis. Calcifications are present in the right vertebral artery at C3 without significant stenosis. Moderate stenosis of the dominant vessel is present at the dural margin secondary to significant atherosclerotic calcifications. The right vertebral artery terminates at the PICA. The left vertebral artery and proximal basilar artery are again noted to be occluded. Basilar artery is reconstituted just  below the tip. Both posterior cerebral arteries are patent. Skeleton: Degenerative changes are again noted in the lower cervical spine. Patient is edentulous. No focal lytic or blastic lesions are present. Other neck: Tracheostomy tube is in place. Soft tissues around the tube are unremarkable. Left  supraglottic mass lesion is again seen. Bilateral level 3 lymph nodes have slightly decreased in size. Upper chest: Centrilobular emphysematous changes are present. No nodule or mass lesion is present. Thoracic inlet is otherwise within normal limits. Right-sided PICC line is noted. IMPRESSION: 1. No acute vascular abnormality to explain hemoptysis. 2. Left supraglottic mass lesion may be the source of hemorrhage. There is no significant interval change. 3. Slight decrease in metastatic bilateral level 3 adenopathy. 4. 55% stenosis of the proximal right internal carotid artery. 5. 60% stenosis of the proximal left internal carotid artery. 6. High-grade stenosis of the distal cervical left ICA and petrous left ICA. 7. Moderate to high-grade stenosis of the cavernous internal carotid arteries bilaterally. 8. 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 The distal basilar artery is reconstituted, likely from retrograde flow. Electronically Signed   By: San Morelle M.D.   On: 03/04/2019 06:57   DG Chest Portable 1 View  Result Date: 03/17/2019 CLINICAL DATA:  Fever. EXAM: PORTABLE CHEST 1 VIEW COMPARISON:  Radiograph 03/04/2019 FINDINGS: Tracheostomy tube tip at the thoracic inlet. Right upper extremity central line tip in the SVC. Streaky opacities at the right lung base are new from prior exam. Unchanged heart size and mediastinal contours. No pulmonary edema or pneumothorax. No large pleural effusion. No acute osseous abnormalities are seen. IMPRESSION: Streaky right lung base opacities, new from radiographs earlier this month, favoring atelectasis but could represent pneumonia in the setting of fever. Electronically Signed   By: Keith Rake M.D.   On: 03/17/2019 15:54   DG Chest Portable 1 View  Result Date: 03/04/2019 CLINICAL DATA:  Hemoptysis. Head and neck tumor. Question aspiration. EXAM: PORTABLE CHEST 1 VIEW COMPARISON:  One-view chest x-ray 02/18/2019  FINDINGS: Heart size is normal. Tracheostomy tube is in satisfactory position. Right-sided PICC line terminates in the distal SVC, above the cavoatrial junction. No edema or effusion is present. No focal airspace disease is present. The visualized soft tissues and bony thorax are otherwise unremarkable. IMPRESSION: No acute cardiopulmonary disease or significant interval change. Electronically Signed   By: San Morelle M.D.   On: 03/04/2019 05:11   DG Chest Port 1 View  Result Date: 02/18/2019 CLINICAL DATA:  Check tracheostomy tube EXAM: PORTABLE CHEST 1 VIEW COMPARISON:  07/31/2018 FINDINGS: Tracheostomy tube is now seen in satisfactory position. Cardiac shadow is within normal limits. The lungs are clear. No bony abnormality is seen. IMPRESSION: Tracheostomy tube in satisfactory position. Electronically Signed   By: Inez Catalina M.D.   On: 02/18/2019 13:46   DG Foot 2 Views Left  Result Date: 02/23/2019 CLINICAL DATA:  Right first toe pain one week. EXAM: LEFT FOOT - 2 VIEW COMPARISON:  None. FINDINGS: No evidence of acute fracture or dislocation. No significant degenerative changes or erosive change. Small inferior calcaneal spur is present. No focal soft tissue abnormality. IMPRESSION: No acute findings. Electronically Signed   By: Marin Olp M.D.   On: 02/23/2019 11:50   DG Swallowing Func-Speech Pathology  Result Date: 02/21/2019 Objective Swallowing Evaluation: Type of Study: MBS-Modified Barium Swallow Study  Patient Details Name: [name]: [id number] Date of Birth: Jul 06, 1960 Today's Date: 02/21/2019 Time: SLP Start Time (ACUTE  ONLY): 1415 -SLP Stop Time (ACUTE ONLY): 1435 SLP Time Calculation (min) (ACUTE ONLY): 20 min Past Medical History: Past Medical History: Diagnosis Date . Anxiety  . Arthritis   back . Bradycardia   a. During 11/2012 adm: lopressor decreased. . Cancer of larynx ([place])  . Carotid artery stenosis  . Chronic lower back pain   "I work Architect; messed  back up ~ 30 yr ago; paralyzed for 2 days then" (11/05/2012) . Coronary artery disease   a. Diag cath 10/2012 for CP/abnormal nuc -> planned PCI s/p LAD atherectomy/DES placement 11/05/12. Marland Kitchen GERD (gastroesophageal reflux disease)  . History of hiatal hernia  . Hypercholesteremia  . Hypertension  . Stroke ([place]) 07/31/2018  bilateral vertebrobasilar occlusion; "left side weaker thanit was before." Past Surgical History: Past Surgical History: Procedure Laterality Date . CARDIAC CATHETERIZATION  10/29/2012 . CORONARY ANGIOPLASTY WITH STENT PLACEMENT  11/05/2012 . DIRECT LARYNGOSCOPY N/A 01/02/2019  Procedure: MICRO DIRECT LARYNGOSCOPY BIOPSY OF LARYNGEAL MASS;  Surgeon: [name], MD;  Location: [facility];  Service: ENT;  Laterality: N/A; . HEMORRHOID SURGERY  ~ 2010 . INSERTION OF MESH N/A 06/02/2015  Procedure: INSERTION OF MESH;  Surgeon: [name], MD;  Location: [facility];  Service: General;  Laterality: N/A; . IR ANGIO INTRA EXTRACRAN SEL COM CAROTID INNOMINATE BILAT MOD SED  08/02/2018 . IR ANGIO VERTEBRAL SEL VERTEBRAL BILAT MOD SED  08/02/2018 . LEFT HEART CATHETERIZATION WITH CORONARY ANGIOGRAM N/A 10/30/2012  Procedure: LEFT HEART CATHETERIZATION WITH CORONARY ANGIOGRAM;  Surgeon: [name], MD;  Location: [facility];  Service: Cardiovascular;  Laterality: N/A; . MULTIPLE EXTRACTIONS WITH ALVEOLOPLASTY N/A 02/18/2019  Procedure: Extraction of tooth #'s 5-7, 10,11,14,20-29, 31 and 32 with alveoloplasty and maxiillary left buccal exostoses reductions;  Surgeon: [name], [name];  Location: [facility];  Service: Oral Surgery;  Laterality: N/A; . PERCUTANEOUS CORONARY ROTOBLATOR INTERVENTION (PCI-R) N/A 11/05/2012  Procedure: PERCUTANEOUS CORONARY ROTOBLATOR INTERVENTION (PCI-R);  Surgeon: [name], MD;  Location: [facility];  Service: Cardiovascular;  Laterality: N/A; . TRACHEOSTOMY TUBE PLACEMENT N/A 02/18/2019  Procedure: Tracheostomy;  Surgeon: [name], MD;  [facility];  Service: ENT;  Laterality:  N/A; . 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 Hx of Anxiety,  During induction airway was quite difficult with friable tissue requiring several attempts before airway cannulated.  Dental extraction was completed.  It was decided to proceed with tracheostomy for airway protection given concern for airway obstruction. Had outpatient BSE on 02/05/19, recommended thin liquids and regular diet.  Subjective: alert, cooperative Assessment / Plan / Recommendation CHL IP CLINICAL IMPRESSIONS 02/21/2019 Clinical Impression Patient presents with a mild pharyngeal phase dysphagia characterized by mildly decreased appearing hyolaryngeal elevation and excursion,  which in combination of presence of diagnosed mass,  is resulting in mild post swallow vallecular and pyriform sinus residuals, which clear with second swallow. Patient able to protect airway with no penetration or aspiration observed. Declined soft solids due to missing dentition and pain post dental extractions.  As pain subsides, suspect that he will be able to swallow advanced solids safely.  Declined pill. Will initiate a po diet and f/u at bedside. Note that PMV in place for evaluation with patient  tolerating well.  SLP Visit Diagnosis Dysphagia, pharyngeal phase (R13.13) Attention and concentration deficit following -- Frontal lobe and executive function deficit following -- Impact on safety and function Mild aspiration risk   CHL IP TREATMENT RECOMMENDATION 02/21/2019 Treatment Recommendations Therapy as outlined in treatment plan below   Prognosis 02/21/2019 Prognosis for Safe Diet Advancement Good Barriers to  Reach Goals -- Barriers/Prognosis Comment -- CHL IP DIET RECOMMENDATION 02/21/2019 SLP Diet Recommendations Dysphagia 1 (Puree) solids;Thin liquid Liquid Administration via Cup;Straw Medication Administration Crushed with puree Compensations Slow rate;Small sips/bites;Multiple dry swallows after each bite/sip Postural Changes Seated upright at 90 degrees   CHL IP OTHER RECOMMENDATIONS 02/21/2019 Recommended Consults -- Oral Care Recommendations Oral care BID Other Recommendations Place PMSV during PO intake   CHL IP FOLLOW UP RECOMMENDATIONS 02/21/2019 Follow up Recommendations Outpatient SLP   CHL IP FREQUENCY AND DURATION 02/21/2019 Speech Therapy Frequency (ACUTE ONLY) min 2x/week Treatment Duration 2 weeks      CHL IP ORAL PHASE 02/21/2019 Oral Phase WFL Oral - Pudding Teaspoon -- Oral - Pudding Cup -- Oral - Honey Teaspoon -- Oral - Honey Cup -- Oral - Nectar Teaspoon -- Oral - Nectar Cup -- Oral - Nectar Straw -- Oral - Thin Teaspoon -- Oral - Thin Cup -- Oral - Thin Straw -- Oral - Puree -- Oral - Mech Soft -- Oral - Regular -- Oral - Multi-Consistency -- Oral - Pill -- Oral Phase - Comment --  CHL IP PHARYNGEAL PHASE 02/21/2019 Pharyngeal Phase Impaired Pharyngeal- Pudding Teaspoon -- Pharyngeal -- Pharyngeal- Pudding Cup -- Pharyngeal -- Pharyngeal- Honey Teaspoon -- Pharyngeal -- Pharyngeal- Honey Cup -- Pharyngeal -- Pharyngeal- Nectar Teaspoon -- Pharyngeal -- Pharyngeal- Nectar Cup -- Pharyngeal -- Pharyngeal- Nectar Straw -- Pharyngeal -- Pharyngeal- Thin Teaspoon -- Pharyngeal -- Pharyngeal- Thin Cup Pharyngeal residue - valleculae;Pharyngeal residue - pyriform;Reduced anterior laryngeal mobility;Reduced laryngeal elevation Pharyngeal -- Pharyngeal- Thin Straw Pharyngeal residue - valleculae;Pharyngeal residue - pyriform;Reduced anterior laryngeal mobility;Reduced laryngeal elevation Pharyngeal -- Pharyngeal- Puree Pharyngeal residue - valleculae;Pharyngeal residue - pyriform;Reduced anterior laryngeal  mobility;Reduced laryngeal elevation Pharyngeal -- Pharyngeal- Mechanical Soft -- Pharyngeal -- Pharyngeal- Regular -- Pharyngeal -- Pharyngeal- Multi-consistency -- Pharyngeal -- Pharyngeal- Pill -- Pharyngeal -- Pharyngeal Comment --  Gabriel Rainwater MA, CCC-SLP [place] Leah Meryl 02/21/2019, 3:56 PM              IR PICC PLACEMENT RIGHT >5 YRS INC IMG GUIDE  Result Date: 02/21/2019 INDICATION: Laryngeal cancer, access for initiation of chemotherapy EXAM: ULTRASOUND AND FLUOROSCOPIC GUIDED PICC LINE INSERTION MEDICATIONS: 1% lidocaine local CONTRAST:  None FLUOROSCOPY TIME:  Twelve seconds (1 mGy) COMPLICATIONS: None immediate. TECHNIQUE: The procedure, risks, benefits, and alternatives were explained to the patient and informed written consent was obtained. A timeout was performed prior to the initiation of the procedure. The right upper extremity was prepped with chlorhexidine in a sterile fashion, and a sterile drape was applied covering the operative field. Maximum barrier sterile technique with sterile gowns and gloves were used for the procedure. A timeout was performed prior to the initiation of the procedure. Local anesthesia was provided with 1% lidocaine. Under direct ultrasound guidance, the right basilic vein was accessed with a micropuncture kit after the overlying soft tissues were anesthetized with 1% lidocaine. An ultrasound image was saved for documentation purposes. A guidewire was advanced to the level of the superior caval-atrial junction for measurement purposes and the PICC line was cut to length. A peel-away sheath was placed and a 37 cm, 5 [place],  dual lumen was inserted to level of the superior caval-atrial junction. A post procedure spot fluoroscopic was obtained. The catheter easily aspirated and flushed and was sutured in place. A dressing was placed. The patient tolerated the procedure well without immediate post procedural complication. FINDINGS: After catheter placement, the tip lies  within the superior cavoatrial junction. The catheter aspirates and flushes normally and is ready for immediate use. IMPRESSION: Successful ultrasound and fluoroscopic guided placement of a right basilic vein approach, 37 cm, 5 French, dual lumen PICC with tip at the superior caval-atrial junction. The PICC line is ready for immediate use. Electronically Signed   By: Jerilynn Mages.  Shick M.D.   On: 02/21/2019 11:25     Discharge Exam: Vitals:   03/18/19 0700 03/18/19 0928  BP:  136/84  Pulse:  64  Resp:  16  Temp:  (!) 97.4 F (36.3 C)  SpO2: 98% 100%   Vitals:   03/18/19 0106 03/18/19 0635 03/18/19 0700 03/18/19 0928  BP: 131/85 137/84  136/84  Pulse: 69 (!) 59  64  Resp: '16 16  16  '$ Temp: 98.7 F (37.1 C) 98 F (36.7 C)  (!) 97.4 F (36.3 C)  TempSrc: Oral Oral  Oral  SpO2: 97% 98% 98% 100%  Weight:      Height:        General: Pt is alert, awake, not in acute distress Cardiovascular: RRR, S1/S2 +, no rubs, no gallops Respiratory: CTA bilaterally, no wheezing, no rhonchi, tracheostomy present Abdominal: Soft, NT, ND, bowel sounds +, PEG tube present, clean dry and intact Extremities: no edema, no cyanosis, right-sided PICC present    The results of significant diagnostics from this hospitalization (including imaging, microbiology, ancillary and laboratory) are listed below for reference.     Microbiology: Recent Results (from the past 240 hour(s))  Blood culture (routine x 2)     Status: None (Preliminary result)   Collection Time: 03/17/19  1:37 PM   Specimen: Right Antecubital; Blood  Result Value Ref Range Status   Specimen Description   Final    RIGHT ANTECUBITAL BOTTLES DRAWN AEROBIC AND ANAEROBIC Performed at [facility], [address] [place], [place] [address]    Special Requests   Final    Blood Culture adequate volume Performed at [facility], [address], [place], [place] [address]    Culture  Setup Time   Final    ANAEROBIC BOTTLE ONLY GRAM VARIABLE  ROD Gram Stain Report Called to,Read Back By and Verified With: K THOMAS,RN'@0533'$  03/18/19 [facility] Performed at [facility], [address], [place], [place] [address]    Culture   Final    CULTURE REINCUBATED FOR BETTER GROWTH Performed at [facility], [place] [address], [place], [place] [address]    Report Status PENDING  Incomplete  Blood culture (routine x 2)     Status: None (Preliminary result)   Collection Time: 03/17/19  1:37 PM   Specimen: Left Antecubital; Blood  Result Value Ref Range Status   Specimen Description   Final    LEFT ANTECUBITAL BOTTLES DRAWN AEROBIC AND ANAEROBIC Performed at [facility], [address] [place], [place] [address]    Special Requests   Final    Blood Culture adequate volume Performed at [facility], [address], [place], [place] [address]    Culture  Setup Time   Final    IN BOTH AEROBIC AND ANAEROBIC BOTTLES GRAM VARIABLE ROD Gram Stain Report Called to,Read Back By and Verified  With: K THOMAS,RN'@0531'$  03/18/19 [facility] Organism ID to follow    Culture   Final    CULTURE REINCUBATED FOR BETTER GROWTH Performed at [facility], [place] [address] [place], [place] [address]    Report Status PENDING  Incomplete  Blood Culture ID Panel (Reflexed)     Status: Abnormal   Collection Time: 03/17/19  1:37 PM  Result Value Ref Range Status   Enterococcus species NOT DETECTED NOT DETECTED Final   Listeria monocytogenes NOT DETECTED NOT DETECTED Final   Staphylococcus species NOT DETECTED NOT DETECTED Final   Staphylococcus aureus (BCID) NOT DETECTED NOT DETECTED Final   Streptococcus species NOT DETECTED NOT DETECTED Final   Streptococcus agalactiae NOT DETECTED NOT DETECTED Final   Streptococcus pneumoniae NOT DETECTED NOT DETECTED Final   Streptococcus pyogenes NOT DETECTED NOT DETECTED Final   Acinetobacter baumannii NOT DETECTED NOT DETECTED Final   Enterobacteriaceae species DETECTED (A) NOT DETECTED Final    Comment:  Enterobacteriaceae represent a large family of gram-negative bacteria, not a single organism. CRITICAL RESULT CALLED TO, READ BACK BY AND VERIFIED WITH: G. Coffee PharmD 10:15 03/18/19 (wilsonm)    Enterobacter cloacae complex NOT DETECTED NOT DETECTED Final   Escherichia coli NOT DETECTED NOT DETECTED Final   Klebsiella oxytoca NOT DETECTED NOT DETECTED Final   Klebsiella pneumoniae NOT DETECTED NOT DETECTED Final   Proteus species NOT DETECTED NOT DETECTED Final   Serratia marcescens DETECTED (A) NOT DETECTED Final    Comment: CRITICAL RESULT CALLED TO, READ BACK BY AND VERIFIED WITH: G. Coffee PharmD 10:15 03/18/19 ([email or web address])    Carbapenem resistance NOT DETECTED NOT DETECTED Final   Haemophilus influenzae NOT DETECTED NOT DETECTED Final   Neisseria meningitidis NOT DETECTED NOT DETECTED Final   Pseudomonas aeruginosa NOT DETECTED NOT DETECTED Final   Candida albicans NOT DETECTED NOT DETECTED Final   Candida glabrata NOT DETECTED NOT DETECTED Final   Candida krusei NOT DETECTED NOT DETECTED Final   Candida parapsilosis NOT DETECTED NOT DETECTED Final   Candida tropicalis NOT DETECTED NOT DETECTED Final    Comment: Performed at [facility], [place]. [address] [place], [place] [address]  SARS CORONAVIRUS 2 (TAT 6-24 HRS) Nasopharyngeal Nasopharyngeal Swab     Status: None   Collection Time: 03/17/19  4:17 PM   Specimen: Nasopharyngeal Swab  Result Value Ref Range Status   SARS Coronavirus 2 NEGATIVE NEGATIVE Final    Comment: (NOTE) SARS-CoV-2 target nucleic acids are NOT DETECTED. The SARS-CoV-2 RNA is generally detectable in upper and lower respiratory specimens during the acute phase of infection. Negative results do not preclude SARS-CoV-2 infection, do not rule out co-infections with other pathogens, and should not be used as the sole basis for treatment or other patient management decisions. Negative results must be combined with clinical observations, patient history,  and epidemiological information. The expected result is Negative. Fact Sheet for Patients: SugarRoll.be Fact Sheet for Healthcare Providers: https://www.woods-mathews.com/ This test is not yet approved or cleared by the [place] FDA and  has been authorized for detection and/or diagnosis of SARS-CoV-2 by FDA under an Emergency Use Authorization (EUA). This EUA will remain  in effect (meaning this test can be used) for the duration of the COVID-19 declaration under Section 56 4(b)(1) of the Act, 21 U.S.C. section 360bbb-3(b)(1), unless the authorization is terminated or revoked sooner. Performed at [facility], [place] [address], [place], [place] [address]      Labs: BNP (last 3 results) No results  for input(s): BNP in the last 8760 hours. Basic Metabolic Panel: Recent Labs  Lab 03/13/19 0844 03/17/19 1337 03/18/19 0546  NA 133* 127* 133*  K 3.9 3.7 4.4  CL 98 92* 99  CO2 '25 24 25  '$ GLUCOSE 109* 136* 117*  BUN '11 18 18  '$ CREATININE 0.88 1.12 0.92  CALCIUM 9.0 8.6* 8.4*  MG 2.0  --   --    Liver Function Tests: Recent Labs  Lab 03/13/19 0844 03/17/19 1337  AST 21 44*  ALT 26 41  ALKPHOS 61 61  BILITOT 0.8 0.7  PROT 7.2 7.5  ALBUMIN 3.6 3.6   No results for input(s): LIPASE, AMYLASE in the last 168 hours. No results for input(s): AMMONIA in the last 168 hours. CBC: Recent Labs  Lab 03/13/19 0844 03/17/19 1337 03/18/19 0546  WBC 6.6 6.2 5.9  NEUTROABS 4.4 5.7 5.2  HGB 9.7* 10.3* 8.4*  HCT 29.6* 31.9* 25.5*  MCV 95.8 95.8 94.8  PLT 363 243 168   Cardiac Enzymes: No results for input(s): CKTOTAL, CKMB, CKMBINDEX, TROPONINI in the last 168 hours. BNP: Invalid input(s): POCBNP CBG: No results for input(s): GLUCAP in the last 168 hours. D-Dimer No results for input(s): DDIMER in the last 72 hours. Hgb A1c No results for input(s): HGBA1C in the last 72 hours. Lipid Profile No results for input(s): CHOL,  HDL, LDLCALC, TRIG, CHOLHDL, LDLDIRECT in the last 72 hours. Thyroid function studies No results for input(s): TSH, T4TOTAL, T3FREE, THYROIDAB in the last 72 hours.  Invalid input(s): FREET3 Anemia work up No results for input(s): VITAMINB12, FOLATE, FERRITIN, TIBC, IRON, RETICCTPCT in the last 72 hours. Urinalysis    Component Value Date/Time   COLORURINE YELLOW 03/17/2019 1600   APPEARANCEUR CLEAR 03/17/2019 1600   LABSPEC 1.021 03/17/2019 1600   PHURINE 6.0 03/17/2019 1600   GLUCOSEU NEGATIVE 03/17/2019 1600   HGBUR NEGATIVE 03/17/2019 1600   BILIRUBINUR NEGATIVE 03/17/2019 1600   KETONESUR NEGATIVE 03/17/2019 1600   PROTEINUR 30 (A) 03/17/2019 1600   NITRITE NEGATIVE 03/17/2019 1600   LEUKOCYTESUR NEGATIVE 03/17/2019 1600   Sepsis Labs Invalid input(s): PROCALCITONIN,  WBC,  LACTICIDVEN Microbiology Recent Results (from the past 240 hour(s))  Blood culture (routine x 2)     Status: None (Preliminary result)   Collection Time: 03/17/19  1:37 PM   Specimen: Right Antecubital; Blood  Result Value Ref Range Status   Specimen Description   Final    RIGHT ANTECUBITAL BOTTLES DRAWN AEROBIC AND ANAEROBIC Performed at [facility], [address] [place], [place] [address]    Special Requests   Final    Blood Culture adequate volume Performed at [facility], [address] [place], [place] [address]    Culture  Setup Time   Final    ANAEROBIC BOTTLE ONLY GRAM VARIABLE ROD Gram Stain Report Called to,Read Back By and Verified With: K THOMAS,RN'@0533'$  03/18/19 [facility] Performed at [facility], [address], [place], [place] [address]    Culture   Final    CULTURE REINCUBATED FOR BETTER GROWTH Performed at [facility], [place] [address], [place], [place] [address]    Report Status PENDING  Incomplete  Blood culture (routine x 2)     Status: None (Preliminary result)   Collection Time: 03/17/19  1:37 PM   Specimen: Left Antecubital; Blood  Result Value Ref Range Status    Specimen Description   Final    LEFT ANTECUBITAL BOTTLES DRAWN AEROBIC AND ANAEROBIC Performed at [facility],  [address] [place], [place] [address]    Special Requests   Final    Blood Culture adequate volume Performed at [facility], [address] [place], [place] [address]    Culture  Setup Time   Final    IN BOTH AEROBIC AND ANAEROBIC BOTTLES GRAM VARIABLE ROD Gram Stain Report Called to,Read Back By and Verified With: K THOMAS,RN'@0531'$  03/18/19 MKELLY Organism ID to follow    Culture   Final    CULTURE REINCUBATED FOR BETTER GROWTH Performed at [facility], [place] [address], [place], [place] [address]    Report Status PENDING  Incomplete  Blood Culture ID Panel (Reflexed)     Status: Abnormal   Collection Time: 03/17/19  1:37 PM  Result Value Ref Range Status   Enterococcus species NOT DETECTED NOT DETECTED Final   Listeria monocytogenes NOT DETECTED NOT DETECTED Final   Staphylococcus species NOT DETECTED NOT DETECTED Final   Staphylococcus aureus (BCID) NOT DETECTED NOT DETECTED Final   Streptococcus species NOT DETECTED NOT DETECTED Final   Streptococcus agalactiae NOT DETECTED NOT DETECTED Final   Streptococcus pneumoniae NOT DETECTED NOT DETECTED Final   Streptococcus pyogenes NOT DETECTED NOT DETECTED Final   Acinetobacter baumannii NOT DETECTED NOT DETECTED Final   Enterobacteriaceae species DETECTED (A) NOT DETECTED Final    Comment: Enterobacteriaceae represent a large family of gram-negative bacteria, not a single organism. CRITICAL RESULT CALLED TO, READ BACK BY AND VERIFIED WITH: G. Coffee PharmD 10:15 03/18/19 (wilsonm)    Enterobacter cloacae complex NOT DETECTED NOT DETECTED Final   Escherichia coli NOT DETECTED NOT DETECTED Final   Klebsiella oxytoca NOT DETECTED NOT DETECTED Final   Klebsiella pneumoniae NOT DETECTED NOT DETECTED Final   Proteus species NOT DETECTED NOT DETECTED Final   Serratia marcescens DETECTED (A) NOT DETECTED Final     Comment: CRITICAL RESULT CALLED TO, READ BACK BY AND VERIFIED WITH: G. Coffee PharmD 10:15 03/18/19 ([email or web address])    Carbapenem resistance NOT DETECTED NOT DETECTED Final   Haemophilus influenzae NOT DETECTED NOT DETECTED Final   Neisseria meningitidis NOT DETECTED NOT DETECTED Final   Pseudomonas aeruginosa NOT DETECTED NOT DETECTED Final   Candida albicans NOT DETECTED NOT DETECTED Final   Candida glabrata NOT DETECTED NOT DETECTED Final   Candida krusei NOT DETECTED NOT DETECTED Final   Candida parapsilosis NOT DETECTED NOT DETECTED Final   Candida tropicalis NOT DETECTED NOT DETECTED Final    Comment: Performed at [facility], [place]. [address] [place] [address] [place], [place] [address]  SARS CORONAVIRUS 2 (TAT 6-24 HRS) Nasopharyngeal Nasopharyngeal Swab     Status: None   Collection Time: 03/17/19  4:17 PM   Specimen: Nasopharyngeal Swab  Result Value Ref Range Status   SARS Coronavirus 2 NEGATIVE NEGATIVE Final    Comment: (NOTE) SARS-CoV-2 target nucleic acids are NOT DETECTED. The SARS-CoV-2 RNA is generally detectable in upper and lower respiratory specimens during the acute phase of infection. Negative results do not preclude SARS-CoV-2 infection, do not rule out co-infections with other pathogens, and should not be used as the sole basis for treatment or other patient management decisions. Negative results must be combined with clinical observations, patient history, and epidemiological information. The expected result is Negative. Fact Sheet for Patients: SugarRoll.be Fact Sheet for Healthcare Providers: https://www.woods-mathews.com/ This test is not yet approved or cleared by the [place] FDA and  has been authorized for detection and/or diagnosis of SARS-CoV-2 by FDA under an Emergency Use Authorization (EUA). This EUA  will remain  in effect (meaning this test can be used) for the duration of the COVID-19 declaration under Section 56  4(b)(1) of the Act, 21 U.S.C. section 360bbb-3(b)(1), unless the authorization is terminated or revoked sooner. Performed at [facility], [place] [address] [place], [place] [address]      Time coordinating discharge: 35 minutes  SIGNED:   Rodena Goldmann, DO [facility] 03/18/2019, 11:05 AM  If 7PM-7AM, please contact night-coverage [email or web address]

## 2019-03-18 NOTE — Progress Notes (Signed)
CRITICAL VALUE ALERT  Critical Value: positive blood cultures, gram variable rods. 1 set both bottles, 2nd set anaerobic  Date & Time Notied: 03/18/19 @ 5:50am  Provider Notified: T. Opyd  Orders Received/Actions taken: No new orders at this time.  Will continue to monitor patient.

## 2019-03-19 ENCOUNTER — Inpatient Hospital Stay: Payer: 59

## 2019-03-19 ENCOUNTER — Ambulatory Visit
Admission: RE | Admit: 2019-03-19 | Discharge: 2019-03-19 | Disposition: A | Discharge status: Home / Self Care | Payer: 59 | Source: Ambulatory Visit | Attending: Radiation Oncology | Admitting: Radiation Oncology

## 2019-03-19 ENCOUNTER — Telehealth: Payer: Self-pay | Admitting: *Deleted

## 2019-03-19 DIAGNOSIS — C321 Malignant neoplasm of supraglottis: Secondary | ICD-10-CM | POA: Diagnosis present

## 2019-03-19 DIAGNOSIS — Z5111 Encounter for antineoplastic chemotherapy: Secondary | ICD-10-CM | POA: Diagnosis not present

## 2019-03-19 DIAGNOSIS — N289 Disorder of kidney and ureter, unspecified: Secondary | ICD-10-CM | POA: Diagnosis not present

## 2019-03-19 DIAGNOSIS — Z87891 Personal history of nicotine dependence: Secondary | ICD-10-CM | POA: Diagnosis not present

## 2019-03-19 DIAGNOSIS — Z510 Encounter for antineoplastic radiation therapy: Secondary | ICD-10-CM | POA: Diagnosis not present

## 2019-03-19 MED ORDER — OSMOLITE 1.5 CAL PO LIQD: ORAL | 0 refills | Status: DC

## 2019-03-19 MED ORDER — PROSOURCE TF PO LIQD: ORAL | 3 refills | Status: DC

## 2019-03-19 NOTE — Telephone Encounter (Signed)
Oncology Nurse Navigator Documentation  Spoke with [name] wife, she confirmed his arrival today for RT and Nutrition appts s/p yesterday's AP discharge.  LINAC 2 and Nutritionist informed.  [name], RN, BSN Head & Neck Oncology Nurse [place] [facility] [place] [phone]

## 2019-03-19 NOTE — Addendum Note (Signed)
Addended by: Jennet Maduro B on: 03/19/2019 02:29 PM   Modules accepted: Orders

## 2019-03-19 NOTE — Progress Notes (Addendum)
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   Met with patient following radiation.  Patient has recently been in the hospital for pneumonia.  Patient reports that his appetite and intake has decreased.  Only eaten eggs today, nothing much yesterday.  Reports throat is a little sore otherwise no mouth pain.  Reports some diarrhea small amount today, 1 episode the day before.  Patient is on antibiotics.     Medications: reviewed  Labs: reviewed  Anthropometrics:   Weight today in clinic 168 lb decreased from 172 lb on 3/8 per [facility] 176 lb on 2/4 UBW of 175-180 lb per patient   Estimated Energy Needs  Kcals: 9191-6606 Protein: 120-140 g Fluid: > 2.4 L  NUTRITION DIAGNOSIS: Predicted suboptimal energy intake continues   INTERVENTION:  Recommend starting osmolite 1.5 via feeding tube with progressive weight loss and decreased oral intake. Patient will start with giving 1 carton of tube feeding today, 2 tomorrow, 3 cartons on 3/19 and 4 cartons on 3/20.   Patient will likely need 7 cartons of osmolite 1.5 and prosource 30 ml BID to meet nutritional needs with progression of treatment side effects if unable to consume oral food.   Will provide 2685 calories, 124 g protein and 2340 ml water (flush and formula). Will contact [facility] for enteral nutrition supply.   Request from [place], [place] that wife would like a call from RD. Discussed regimen with wife.  Wife reports that she is concerned about patient that he seems depressed and giving up. Wife wanting to speak with social worker if possible as not sure what to do to help patient.   Message sent to social worker.    Patient has contact information      MONITORING, EVALUATION, GOAL: Patient will utilize feeding tube and oral intake to better meet nutritional needs   NEXT VISIT: Wednesday, March 24  Abel Hageman B. [name], [place], [place] Registered Dietitian [phone]  (pager)

## 2019-03-20 ENCOUNTER — Inpatient Hospital Stay (HOSPITAL_COMMUNITY): Payer: 59

## 2019-03-20 ENCOUNTER — Other Ambulatory Visit: Payer: Self-pay

## 2019-03-20 ENCOUNTER — Inpatient Hospital Stay (HOSPITAL_BASED_OUTPATIENT_CLINIC_OR_DEPARTMENT_OTHER): Payer: 59 | Admitting: Hematology

## 2019-03-20 ENCOUNTER — Ambulatory Visit
Admission: RE | Admit: 2019-03-20 | Discharge: 2019-03-20 | Disposition: A | Discharge status: Home / Self Care | Payer: 59 | Source: Ambulatory Visit | Attending: Radiation Oncology | Admitting: Radiation Oncology

## 2019-03-20 ENCOUNTER — Encounter: Payer: Self-pay | Admitting: General Practice

## 2019-03-20 DIAGNOSIS — Z5111 Encounter for antineoplastic chemotherapy: Secondary | ICD-10-CM | POA: Diagnosis not present

## 2019-03-20 DIAGNOSIS — C329 Malignant neoplasm of larynx, unspecified: Secondary | ICD-10-CM

## 2019-03-20 DIAGNOSIS — C321 Malignant neoplasm of supraglottis: Secondary | ICD-10-CM | POA: Diagnosis not present

## 2019-03-20 LAB — CBC WITH DIFFERENTIAL/PLATELET
Abs Immature Granulocytes: 0.02 10*3/uL (ref 0.00–0.07)
Basophils Absolute: 0 10*3/uL (ref 0.0–0.1)
Basophils Relative: 0 %
Eosinophils Absolute: 0 10*3/uL (ref 0.0–0.5)
Eosinophils Relative: 1 %
HCT: 25.9 % — ABNORMAL LOW (ref 39.0–52.0)
Hemoglobin: 8.8 g/dL — ABNORMAL LOW (ref 13.0–17.0)
Immature Granulocytes: 1 %
Lymphocytes Relative: 12 %
Lymphs Abs: 0.4 10*3/uL — ABNORMAL LOW (ref 0.7–4.0)
MCH: 31.4 pg (ref 26.0–34.0)
MCHC: 34 g/dL (ref 30.0–36.0)
MCV: 92.5 fL (ref 80.0–100.0)
Monocytes Absolute: 0.5 10*3/uL (ref 0.1–1.0)
Monocytes Relative: 14 %
Neutro Abs: 2.7 10*3/uL (ref 1.7–7.7)
Neutrophils Relative %: 72 %
Platelets: 169 10*3/uL (ref 150–400)
RBC: 2.8 MIL/uL — ABNORMAL LOW (ref 4.22–5.81)
RDW: 13 % (ref 11.5–15.5)
WBC: 3.7 10*3/uL — ABNORMAL LOW (ref 4.0–10.5)
nRBC: 0 % (ref 0.0–0.2)

## 2019-03-20 LAB — CULTURE, BLOOD (ROUTINE X 2)
Special Requests: ADEQUATE
Special Requests: ADEQUATE

## 2019-03-20 NOTE — Progress Notes (Signed)
[place] CSW Progress Notes  Called wife, Dejay Kronk, at request of dietitian Jennet Maduro. Concerned that patient has significantly less energy, is less communicative, wants to stay in bed and watch TV, isolates himself.  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  Then was never called back to work and subsequently found out he had throat cancer.  Worked under houses Production designer, theatre/television/film - wife concerned that he would "come home green" as he was asked to work under houses after they had been treated for termites.  Is currently receiving Social Security disability, has applied for Medicaid ([place]).   Prior to this illness, "he was a very independent man", would Worries about leaving him alone because she is afraid he will experience a respiratory emergency and "no one would be there to help him."  States patient has not touched his trach and has not done any trach care - thinks "he will do something wrong and the trach will close up."  Pt will let wife know when trach needs to be changed but he will not do it himself.  Wife cleans trach 3 - 5 times/day, gets up at night to clean trach.  States "he was not shown how to care for his trach", wife and daughter were given instructions, not patient.  Wife wonders if patient needs to become more independent w his trach care.    Wife notes significant difference in his demeanor/mood.  Easily upset over minor issues especially when wife is not with him ("we are together 24/7"), in the past was "happy go lucky."  Wife has also felt isolated from all his treatment providers due to COVID restrictions.  Has felt disconnected from information about his  treatment and how to help. Per wife, patient was surprised that he was discharged from hospital quickly after diagnosis of pneumonia.  Has home health w [facility] only - was released by PT as he was "doing well."   CSW will speak w treatment team - patient and wife may need help discussing caregiving arrangements and how to both give patient maximum amount of independence and wife reassurance and support in her role of support.  After talking w team, CSW will reconnect wife and patient.  Edwyna Shell, LCSW Clinical Social Worker Phone:  [phone] Cell:  [phone]

## 2019-03-20 NOTE — Patient Instructions (Addendum)
[facility] Discharge Instructions  You were seen today by Dr. Katragadda. He went over your recent lab results. He will see you back in 1 week for labs, treatment and follow up.   Thank you for choosing [facility] to provide your oncology and hematology care.  To afford each patient quality time with our provider, please arrive at least 15 minutes before your scheduled appointment time.   If you have a lab appointment with the Cancer Center please come in thru the  Main Entrance and check in at the main information desk  You need to re-schedule your appointment should you arrive 10 or more minutes late.  We strive to give you quality time with our providers, and arriving late affects you and other patients whose appointments are after yours.  Also, if you no show three or more times for appointments you may be dismissed from the clinic at the providers discretion.     Again, thank you for choosing [facility].  Our hope is that these requests will decrease the amount of time that you wait before being seen by our physicians.       _____________________________________________________________  Should you have questions after your visit to [facility], please contact our office at [phone] between the hours of 8:00 a.m. and 4:30 p.m.  Voicemails left after 4:00 p.m. will not be returned until the following business day.  For prescription refill requests, have your pharmacy contact our office and allow 72 hours.    Cancer Center Support Programs:   > Cancer Support Group  2nd Tuesday of the month 1pm-2pm, Journey Room    

## 2019-03-20 NOTE — Progress Notes (Signed)
[name], [place] [address]   CLINIC:  Medical Oncology/Hematology  PCP:  [name], [place] [address] [phone]   REASON FOR VISIT:  Follow-up for supraglottic squamous cell carcinoma.  CURRENT THERAPY: Weekly cisplatin.  BRIEF ONCOLOGIC HISTORY:  Oncology History  Malignant neoplasm of supraglottis ([place])  01/22/2019 Initial Diagnosis   Malignant neoplasm of supraglottis ([facility])   01/30/2019 Cancer Staging   Staging form: Larynx - Supraglottis, AJCC 8th Edition - Clinical: Stage IVA (cT2, cN2c, cM0) - Signed by [name], MD on 01/30/2019   03/07/2019 -  Chemotherapy   The patient had palonosetron (ALOXI) injection 0.25 mg, 0.25 mg, Intravenous,  Once, 2 of 7 cycles Administration: 0.25 mg (03/13/2019), 0.25 mg (03/07/2019) CISplatin (PLATINOL) 79 mg in sodium chloride 0.9 % 250 mL chemo infusion, 40 mg/m2 = 79 mg, Intravenous,  Once, 2 of 7 cycles Administration: 79 mg (03/13/2019), 79 mg (03/07/2019) fosaprepitant (EMEND) 150 mg in sodium chloride 0.9 % 145 mL IVPB, 150 mg, Intravenous,  Once, 2 of 7 cycles Administration: 150 mg (03/13/2019), 150 mg (03/07/2019)  for chemotherapy treatment.       CANCER STAGING: Cancer Staging Malignant neoplasm of supraglottis ([facility]) Staging form: Larynx - Supraglottis, AJCC 8th Edition - Clinical: Stage IVA (cT2, cN2c, cM0) - Signed by [name], MD on 01/30/2019    INTERVAL HISTORY:  [name] 59 y.o. male seen for follow-up of supraglottic squamous cell carcinoma.  Recently hospitalized for 1 day for fever and infection.  Blood cultures grew Serratia.  He is currently on antibiotics.  Appetite and energy levels are 50%.  Reports some dizziness.  He was reportedly started on tube feeds with 1 g yesterday.  He is taking 2 cans today.  He is eating 3 times a day.  He reports some soreness in the throat.  He started back on radiation  yesterday.    REVIEW OF SYSTEMS:  Review of Systems  Constitutional: Positive for fatigue.  Respiratory: Positive for cough.   Gastrointestinal: Positive for diarrhea.  Neurological: Positive for dizziness.  All other systems reviewed and are negative.    PAST MEDICAL/SURGICAL HISTORY:  Past Medical History:  Diagnosis Date  . Anxiety   . Arthritis    back  . Bradycardia    a. During 11/2012 adm: lopressor decreased.  . Cancer of larynx ([facility])   . Carotid artery stenosis   . 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 Coronary artery disease    a. Diag cath 10/2012 for CP/abnormal nuc -> planned PCI s/p LAD atherectomy/DES placement 11/05/12.  Marland Kitchen GERD (gastroesophageal reflux disease)   . History of hiatal hernia   . Hypercholesteremia   . Hypertension   . Stroke ([facility]) 07/31/2018   bilateral vertebrobasilar occlusion; "left side weaker thanit was before."   Past Surgical History:  Procedure Laterality Date  . CARDIAC CATHETERIZATION  10/29/2012  . CORONARY ANGIOPLASTY WITH STENT PLACEMENT  11/05/2012  . DIRECT LARYNGOSCOPY N/A 01/02/2019   Procedure: MICRO DIRECT LARYNGOSCOPY BIOPSY OF LARYNGEAL MASS;  Surgeon: [name], MD;  Location: [facility];  Service: ENT;  Laterality: N/A;  . HEMORRHOID SURGERY  ~ 2010  . INSERTION OF MESH N/A 06/02/2015   Procedure: INSERTION OF MESH;  Surgeon: [name], MD;  Location: [facility];  Service: General;  Laterality: N/A;  . IR ANGIO INTRA EXTRACRAN  SEL COM CAROTID INNOMINATE BILAT MOD SED  08/02/2018  . IR ANGIO VERTEBRAL SEL VERTEBRAL BILAT MOD SED  08/02/2018  . LAPAROSCOPIC INSERTION GASTROSTOMY TUBE N/A 02/24/2019   Procedure: LAPAROSCOPIC INSERTION GASTROSTOMY TUBE;  Surgeon: [name], MD;  Location: [facility];  Service: General;  Laterality: N/A;  . LEFT HEART CATHETERIZATION WITH CORONARY ANGIOGRAM N/A 10/30/2012   Procedure: LEFT HEART CATHETERIZATION WITH  CORONARY ANGIOGRAM;  Surgeon: [name], MD;  Location: [facility];  Service: Cardiovascular;  Laterality: N/A;  . MULTIPLE EXTRACTIONS WITH ALVEOLOPLASTY N/A 02/18/2019   Procedure: Extraction of tooth #'s 5-7, 10,11,14,20-29, 31 and 32 with alveoloplasty and maxiillary left buccal exostoses reductions;  Surgeon: [name], [name];  Location: [facility];  Service: Oral Surgery;  Laterality: N/A;  . PERCUTANEOUS CORONARY ROTOBLATOR INTERVENTION (PCI-R) N/A 11/05/2012   Procedure: PERCUTANEOUS CORONARY ROTOBLATOR INTERVENTION (PCI-R);  Surgeon: [name], MD;  Location: [facility];  Service: Cardiovascular;  Laterality: N/A;  . TRACHEOSTOMY TUBE PLACEMENT N/A 02/18/2019   Procedure: Tracheostomy;  Surgeon: [name], MD;  Location: [facility];  Service: ENT;  Laterality: N/A;  . UMBILICAL HERNIA REPAIR N/A 06/02/2015   Procedure: UMBILICAL HERNIORRHAPHY WITH MESH;  Surgeon: [name], MD;  Location: [facility];  Service: General;  Laterality: N/A;     SOCIAL HISTORY:  Social History   Socioeconomic History  . Marital status: Married    Spouse name: Not on file  . Number of children: 4  . Years of education: Not on file  . Highest education level: Not on file  Occupational History  . Occupation: Disabled  Tobacco Use  . Smoking status: Former Smoker    Packs/day: 3.00    Years: 32.00    Pack years: 96.00    Types: Cigarettes    Start date: 08/29/1977    Quit date: 11/29/2006    Years since quitting: 12.3  . Smokeless tobacco: Former Systems developer    Types: Chew  . Tobacco comment: 11/05/2012 "chewed tobacco when I play ball; aien't chewed since age 52"  Substance and Sexual Activity  . Alcohol use: Yes    Alcohol/week: 12.0 standard drinks    Types: 12 Cans of beer per week    Comment: Currently 3-4 beers per day. 02/13/19, was a heavy drinker in the past, - states he could drink a case a day   . Drug use: Yes    Types: Marijuana  . Sexual activity: Not Currently  Other Topics  Concern  . Not on file  Social History Narrative   Patient is disabled.  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   Patient quit smoking 10 to 12 years ago.  Patient previously 3 pack/day use.   Patient currently drinking 3-4 beers per day from 12 pack a day.   Patient denies use of chew or other illicit drugs.   Social Determinants of Health   Financial Resource Strain: Low Risk   . Difficulty of Paying Living Expenses: Not hard at all  Food Insecurity: No Food Insecurity  . Worried About Charity fundraiser in the Last Year: Never true  . Ran Out of Food in the Last Year: Never true  Transportation Needs: No Transportation Needs  . Lack of Transportation (Medical): No  . Lack of Transportation (Non-Medical): No  Physical Activity: Insufficiently Active  . Days of Exercise per Week: 2 days  . Minutes of Exercise per Session: 30 min  Stress: Stress Concern Present  .  Feeling of Stress : To some extent  Social Connections: Moderately Isolated  . Frequency of Communication with Friends and Family: Once a week  . Frequency of Social Gatherings with Friends and Family: Once a week  . Attends Religious Services: Never  . Active Member of Clubs or Organizations: No  . Attends Archivist Meetings: Never  . Marital Status: Married  Human resources officer Violence: Not At Risk  . Fear of Current or Ex-Partner: No  . Emotionally Abused: No  . Physically Abused: No  . Sexually Abused: No    FAMILY HISTORY:  Family History  Problem Relation Age of Onset  . Hypertension Mother   . Heart disease Father   . Hypertension Father   . Heart disease Sister   . Hypertension Maternal Grandmother   . Hypertension Maternal Grandfather   . Hypertension Paternal Grandmother   . Hypertension Paternal Grandfather     CURRENT MEDICATIONS:  Outpatient Encounter Medications as of 03/20/2019  Medication Sig Note  . aspirin EC 81 MG tablet Take 81 mg by mouth daily.    Marland Kitchen atorvastatin (LIPITOR) 80 MG tablet Take 1 tablet (80 mg total) by mouth every evening.   . Catheters (ARGYLE SUCTION CATHETER 14FR) MISC 1 Device by Does not apply route daily.   Marland Kitchen CISPLATIN IV Inject into the vein once a week. Weekly in conjunction with radiation therapy beginning 03/06/2019   . DULoxetine (CYMBALTA) 30 MG capsule Take 30 mg by mouth daily.   Marland Kitchen levofloxacin (LEVAQUIN) 750 MG tablet Take 1 tablet (750 mg total) by mouth daily for 7 days.   Marland Kitchen lidocaine (XYLOCAINE) 2 % solution Patient: Mix 1part 2% viscous lidocaine, 1part H20. Swish & swallow 68m of diluted mixture, 365m before meals and at bedtime, up to QID   . metoprolol tartrate (LOPRESSOR) 25 MG tablet TAKE 1/2 TABLET BY MOUTH TWICE DAILY   . Misc. Devices MISC 10 mL saline flushes - one box   . MONOJECT PREFILL ADVANCED NACL 0.9 % SOLN injection USE AS DIRECTED.A   . naproxen (NAPROSYN) 250 MG tablet Take 250 mg by mouth 2 (two) times daily.   . Nutritional Supplements (FEEDING SUPPLEMENT, OSMOLITE 1.5 CAL,) LIQD Give 2 cartons of osmolite 1.5 at 9:30am, 1:30, 5:30 and 1 carton at 9:30pm.  Flush with 6052mf water before and 120m16mter each feeding. Meets 100% of patient needs. Send bolus supplies.   . Nutritional Supplements (PROSOURCE TF) LIQD Give 30ml90mimes per day via feeding tube.  Mix with 60ml 32mater.  Flush tube with 60ml o49mter, then place prosource and water mixture in tube and flush with 60ml of85mer after.   . omepraMarland Kitchenole (PRILOSEC) 20 MG capsule Take 20 mg by mouth daily.    . sodium chloride irrigation 0.9 % irrigation Irrigate with 200 mLs as directed continuous.   . Tracheostomy Care KIT 1 kit by Does not apply route daily.   . amLODiMarland Kitchenine (NORVASC) 5 MG tablet Take 1 tablet (5 mg total) by mouth daily. (Patient not taking: Reported on 03/17/2019)   . clopidogrel (PLAVIX) 75 MG tablet Take 1 tablet (75 mg total) by mouth daily. (Patient not taking: Reported on 02/17/2019) 03/06/2019: Pt will start back  taking in a couple weeks per pt  . HYDROcodone-acetaminophen (NORCO) 10-325 MG tablet Take 1 tablet by mouth every 4 (four) hours as needed. (Patient not taking: Reported on 03/06/2019)   . lidocaine-prilocaine (EMLA) cream Apply 1 application topically as needed. Apply small amount  to port site and cover with plastic wrap one hour before appointment. (Patient not taking: Reported on 03/13/2019)   . nitroGLYCERIN (NITROSTAT) 0.4 MG SL tablet Place 1 tablet (0.4 mg total) under the tongue every 5 (five) minutes as needed for chest pain (CP or SOB). (Patient not taking: Reported on 02/17/2019)   . prochlorperazine (COMPAZINE) 10 MG tablet Take 1 tablet (10 mg total) by mouth every 6 (six) hours as needed (Nausea or vomiting). (Patient not taking: Reported on 03/06/2019)   . traMADol (ULTRAM) 50 MG tablet Take 50-100 mg by mouth 4 (four) times daily as needed (pain.).     No facility-administered encounter medications on file as of 03/20/2019.    ALLERGIES:  Allergies  Allergen Reactions  . Prednisone Other (See Comments)    Chest pain     PHYSICAL EXAM:  ECOG Performance status: 1  There were no vitals filed for this visit. Filed Weights   03/20/19 0954  Weight: 166 lb (75.3 kg)    Physical Exam Vitals reviewed.  Constitutional:      Appearance: Normal appearance.  Neck:     Comments: Tracheostomy site is not bleeding. Cardiovascular:     Rate and Rhythm: Normal rate and regular rhythm.     Heart sounds: Normal heart sounds.  Pulmonary:     Effort: Pulmonary effort is normal.     Breath sounds: Normal breath sounds.  Abdominal:     General: There is no distension.     Palpations: Abdomen is soft. There is no mass.     Comments: PEG site is within normal limits.  Musculoskeletal:        General: No swelling.  Skin:    General: Skin is warm.  Neurological:     General: No focal deficit present.     Mental Status: He is alert and oriented to person, place, and time.   Psychiatric:        Mood and Affect: Mood normal.        Behavior: Behavior normal.      LABORATORY DATA:  I have reviewed the labs as listed.  CBC    Component Value Date/Time   WBC 3.7 (L) 03/20/2019 0944   RBC 2.80 (L) 03/20/2019 0944   HGB 8.8 (L) 03/20/2019 0944   HCT 25.9 (L) 03/20/2019 0944   PLT 169 03/20/2019 0944   MCV 92.5 03/20/2019 0944   MCH 31.4 03/20/2019 0944   MCHC 34.0 03/20/2019 0944   RDW 13.0 03/20/2019 0944   LYMPHSABS 0.4 (L) 03/20/2019 0944   MONOABS 0.5 03/20/2019 0944   EOSABS 0.0 03/20/2019 0944   BASOSABS 0.0 03/20/2019 0944   CMP Latest Ref Rng & Units 03/18/2019 03/17/2019 03/13/2019  Glucose 70 - 99 mg/dL 117(H) 136(H) 109(H)  BUN 6 - 20 mg/dL '18 18 11  '$ Creatinine 0.61 - 1.24 mg/dL 0.92 1.12 0.88  Sodium 135 - 145 mmol/L 133(L) 127(L) 133(L)  Potassium 3.5 - 5.1 mmol/L 4.4 3.7 3.9  Chloride 98 - 111 mmol/L 99 92(L) 98  CO2 22 - 32 mmol/L '25 24 25  '$ Calcium 8.9 - 10.3 mg/dL 8.4(L) 8.6(L) 9.0  Total Protein 6.5 - 8.1 g/dL - 7.5 7.2  Total Bilirubin 0.3 - 1.2 mg/dL - 0.7 0.8  Alkaline Phos 38 - 126 U/L - 61 61  AST 15 - 41 U/L - 44(H) 21  ALT 0 - 44 U/L - 41 26       DIAGNOSTIC IMAGING:  I have independently reviewed scans.  ASSESSMENT & PLAN:   Malignant neoplasm of supraglottis ([facility]) 1.  T3N2C supraglottic squamous cell carcinoma: -Laryngoscopy and biopsy on 01/02/2019.  Lurline Idol was placed on 02/18/2019. -Weekly cisplatin and radiation started on 03/07/2019. -He was hospitalized from 03/17/2019 through 03/18/2019 secondary to fever and Serratia infection.  I have reviewed the hospital records and culture reports. -He was started back on radiation on 03/19/2019.  I reviewed his labs.  White count is 3.7 with ANC normal. -He still is weak.  He is continuing antibiotic for Serratia.  Hence I would hold off on chemotherapy this week. -We will reevaluate him next week.  2.  Nutrition: -He is eating by mouth as much as possible. -He  started tube feeds yesterday with 1 can.  He is taking into cans today and will increase to 3 cans tomorrow. -His throat is sore.  3.  Renal insufficiency: -His most recent creatinine come down to 0.89.  4.  CVA: -He had pontine stroke and large vessel occlusion. -He has bleeding from tracheal site and Plavix was held since 03/04/2019.  Is continuing aspirin 81 mg daily.      Orders placed this encounter:  No orders of the defined types were placed in this encounter.    [name], MD [facility] [phone]

## 2019-03-20 NOTE — Assessment & Plan Note (Addendum)
1.  T3N2C supraglottic squamous cell carcinoma: -Laryngoscopy and biopsy on 01/02/2019.  Lurline Idol was placed on 02/18/2019. -Weekly cisplatin and radiation started on 03/07/2019. -He was hospitalized from 03/17/2019 through 03/18/2019 secondary to fever and Serratia infection.  I have reviewed the hospital records and culture reports. -He was started back on radiation on 03/19/2019.  I reviewed his labs.  White count is 3.7 with ANC normal. -He still is weak.  He is continuing antibiotic for Serratia.  Hence I would hold off on chemotherapy this week. -We will reevaluate him next week.  2.  Nutrition: -He is eating by mouth as much as possible. -He started tube feeds yesterday with 1 can.  He is taking into cans today and will increase to 3 cans tomorrow. -His throat is sore.  3.  Renal insufficiency: -His most recent creatinine come down to 0.89.  4.  CVA: -He had pontine stroke and large vessel occlusion. -He has bleeding from tracheal site and Plavix was held since 03/04/2019.  Is continuing aspirin 81 mg daily.

## 2019-03-20 NOTE — Patient Instructions (Signed)
[place] [place] at [facility]  Discharge Instructions:  No treatment today per MD.  _______________________________________________________________  Thank you for choosing [facility] at [facility] to provide your oncology and hematology care.  To afford each patient quality time with our providers, please arrive at least 15 minutes before your scheduled appointment.  You need to re-schedule your appointment if you arrive 10 or more minutes late.  We strive to give you quality time with our providers, and arriving late affects you and other patients whose appointments are after yours.  Also, if you no show three or more times for appointments you may be dismissed from the clinic.  Again, thank you for choosing [facility] at [facility] hope is that these requests will allow you access to exceptional care and in a timely manner. _______________________________________________________________  If you have questions after your visit, please contact our office at [phone] between the hours of 8:30 a.m. and 5:00 p.m. Voicemails left after 4:30 p.m. will not be returned until the following business day. _______________________________________________________________  For prescription refill requests, have your pharmacy contact our office. _______________________________________________________________  Recommendations made by the consultant and any test results will be sent to your referring physician. _______________________________________________________________

## 2019-03-20 NOTE — Progress Notes (Signed)
Patient has been assessed, vital signs and labs have been reviewed by Dr. Delton Coombes. He will HOLD Tx today and see him back next week for Tx

## 2019-03-20 NOTE — Progress Notes (Signed)
No treatment today per MD. On antibiotics for pneumonia. Called wife and spoke to her about the PICC line issues. Issues were resolved with that per protocol.    Vear Clock presented for PICC line flush. PICC line located right upper arm Good blood return present from both lumens PICC line lumens time 2 flushed with 67ml NS and 300U/39ml Heparin.   Patient tolerated procedure well.Vitals stable and discharged home from clinic ambulatory. Follow up as scheduled.

## 2019-03-21 ENCOUNTER — Other Ambulatory Visit (HOSPITAL_COMMUNITY): Payer: Self-pay | Admitting: *Deleted

## 2019-03-21 ENCOUNTER — Other Ambulatory Visit: Payer: Self-pay

## 2019-03-21 ENCOUNTER — Ambulatory Visit
Admission: RE | Admit: 2019-03-21 | Discharge: 2019-03-21 | Disposition: A | Discharge status: Home / Self Care | Payer: 59 | Source: Ambulatory Visit | Attending: Radiation Oncology | Admitting: Radiation Oncology

## 2019-03-21 DIAGNOSIS — Z5111 Encounter for antineoplastic chemotherapy: Secondary | ICD-10-CM | POA: Diagnosis not present

## 2019-03-21 MED ORDER — MISC. DEVICES MISC: 99 refills | Status: DC

## 2019-03-24 ENCOUNTER — Other Ambulatory Visit: Payer: Self-pay

## 2019-03-24 ENCOUNTER — Ambulatory Visit
Admission: RE | Admit: 2019-03-24 | Discharge: 2019-03-24 | Disposition: A | Discharge status: Home / Self Care | Payer: 59 | Source: Ambulatory Visit | Attending: Radiation Oncology | Admitting: Radiation Oncology

## 2019-03-24 ENCOUNTER — Inpatient Hospital Stay (HOSPITAL_COMMUNITY): Payer: 59

## 2019-03-24 DIAGNOSIS — Z5111 Encounter for antineoplastic chemotherapy: Secondary | ICD-10-CM | POA: Diagnosis not present

## 2019-03-25 ENCOUNTER — Ambulatory Visit (HOSPITAL_COMMUNITY): Payer: 59

## 2019-03-25 ENCOUNTER — Ambulatory Visit (HOSPITAL_COMMUNITY): Payer: 59 | Admitting: Hematology

## 2019-03-25 ENCOUNTER — Ambulatory Visit
Admission: RE | Admit: 2019-03-25 | Discharge: 2019-03-25 | Disposition: A | Discharge status: Home / Self Care | Payer: 59 | Source: Ambulatory Visit | Attending: Radiation Oncology | Admitting: Radiation Oncology

## 2019-03-25 ENCOUNTER — Other Ambulatory Visit: Payer: Self-pay

## 2019-03-25 ENCOUNTER — Telehealth: Payer: Self-pay

## 2019-03-25 ENCOUNTER — Other Ambulatory Visit (HOSPITAL_COMMUNITY): Payer: 59

## 2019-03-25 DIAGNOSIS — Z5111 Encounter for antineoplastic chemotherapy: Secondary | ICD-10-CM | POA: Diagnosis not present

## 2019-03-25 NOTE — Telephone Encounter (Signed)
Nutrition  RD spoke with [name] this am to follow-up on home health and enteral supplies.  RD has been in communication with [employer] to provide enteral supplies as [employer] will not accept insurance.  Wife reports that [employer] reached out to her yesterday and will be able to supply enteral products for one week as waiting approval from insurance.  After approval will be able to supply 1 month supply at a time.    Wife reports patient is tolerating 4 cartons of osmolite 1.5. Not eating much orally as no taste per wife.    Next visit:  Wednesday, March 24th.    [name] B. [name], [name], [name] Registered Dietitian [phone] (pager)

## 2019-03-26 ENCOUNTER — Inpatient Hospital Stay: Payer: 59

## 2019-03-26 ENCOUNTER — Other Ambulatory Visit: Payer: Self-pay

## 2019-03-26 ENCOUNTER — Ambulatory Visit
Admission: RE | Admit: 2019-03-26 | Discharge: 2019-03-26 | Disposition: A | Discharge status: Home / Self Care | Payer: 59 | Source: Ambulatory Visit | Attending: Radiation Oncology | Admitting: Radiation Oncology

## 2019-03-26 DIAGNOSIS — Z5111 Encounter for antineoplastic chemotherapy: Secondary | ICD-10-CM | POA: Diagnosis not present

## 2019-03-26 NOTE — Progress Notes (Addendum)
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 Noted chemotherapy held yesterday.   Met with patient today following radiation.  Patient reports that he is tolerating 4 cartons of osmolite 1.5 (9:30, 12:30, 3:30, 6:30) without difficulty.  Patient reports that he is drinking 1-2 boost per day.  Yesterday was able to eat 2 eggs and drink sprite after tube feeding.  Later in the afternoon ate 1/2 peanut butter sandwich and had soup in the evening.  Also ate ice cream yesterday.  Reports that he can taste foods better if he puts peanut butter in his mouth.  Has been using lidocaine mouthwash.  Reports "loose" stool not watery about 1 time per day (continues on antibiotics).    [facility] has been in touch with patient and will deliver more tube feeding supplies tomorrow 3/25.      Medications: reviewed  Labs: reviewed  Anthropometrics:   Weight on 3/22 per [facility] 168 lb stable  172 lb on 3/8 176 lb on 2/4  UBW 175-180 lb per patient   Estimated Energy Needs  Kcals: 2400-2800 Protein: 120-140 g Fluid: > 2.4 L  NUTRITION DIAGNOSIS: Predicted suboptimal energy intake continues   INTERVENTION:  Continue osmolite 1.5, 4 cartons per day at this time with current oral intake.  Flush with 60ml of water before and 120ml after. Patient drinking orally as well for additional hydration.  Patient denies questions with giving tube feeding. Patient was able to return demonstration last visit (3/17) and give one carton of tube feeding without difficulty.   Patient has contact information.    MONITORING, EVALUATION, GOAL: Patient will utilize feeding tube and oral intake to meet nutritional needs   NEXT VISIT: March 31 after radiation  Joli B. Allen, RD, LDN Registered Dietitian [phone] (pager)     

## 2019-03-27 ENCOUNTER — Other Ambulatory Visit: Payer: Self-pay

## 2019-03-27 ENCOUNTER — Ambulatory Visit
Admission: RE | Admit: 2019-03-27 | Discharge: 2019-03-27 | Disposition: A | Discharge status: Home / Self Care | Payer: 59 | Source: Ambulatory Visit | Attending: Radiation Oncology | Admitting: Radiation Oncology

## 2019-03-27 ENCOUNTER — Encounter (HOSPITAL_COMMUNITY): Payer: Self-pay | Admitting: Hematology

## 2019-03-27 ENCOUNTER — Inpatient Hospital Stay (HOSPITAL_COMMUNITY): Payer: 59

## 2019-03-27 ENCOUNTER — Inpatient Hospital Stay (HOSPITAL_BASED_OUTPATIENT_CLINIC_OR_DEPARTMENT_OTHER): Payer: 59 | Admitting: Hematology

## 2019-03-27 VITALS — BP 114/64 | HR 66 | Temp 96.9°F | Resp 18

## 2019-03-27 DIAGNOSIS — C329 Malignant neoplasm of larynx, unspecified: Secondary | ICD-10-CM

## 2019-03-27 DIAGNOSIS — Z5111 Encounter for antineoplastic chemotherapy: Secondary | ICD-10-CM | POA: Diagnosis not present

## 2019-03-27 DIAGNOSIS — C321 Malignant neoplasm of supraglottis: Secondary | ICD-10-CM | POA: Diagnosis not present

## 2019-03-27 LAB — CBC WITH DIFFERENTIAL/PLATELET
Abs Immature Granulocytes: 0.01 10*3/uL (ref 0.00–0.07)
Basophils Absolute: 0 10*3/uL (ref 0.0–0.1)
Basophils Relative: 0 %
Eosinophils Absolute: 0.1 10*3/uL (ref 0.0–0.5)
Eosinophils Relative: 2 %
HCT: 25.8 % — ABNORMAL LOW (ref 39.0–52.0)
Hemoglobin: 8.3 g/dL — ABNORMAL LOW (ref 13.0–17.0)
Immature Granulocytes: 0 %
Lymphocytes Relative: 20 %
Lymphs Abs: 0.7 10*3/uL (ref 0.7–4.0)
MCH: 31 pg (ref 26.0–34.0)
MCHC: 32.2 g/dL (ref 30.0–36.0)
MCV: 96.3 fL (ref 80.0–100.0)
Monocytes Absolute: 0.5 10*3/uL (ref 0.1–1.0)
Monocytes Relative: 16 %
Neutro Abs: 2.1 10*3/uL (ref 1.7–7.7)
Neutrophils Relative %: 62 %
Platelets: 136 10*3/uL — ABNORMAL LOW (ref 150–400)
RBC: 2.68 MIL/uL — ABNORMAL LOW (ref 4.22–5.81)
RDW: 14.9 % (ref 11.5–15.5)
WBC: 3.4 10*3/uL — ABNORMAL LOW (ref 4.0–10.5)
nRBC: 0 % (ref 0.0–0.2)

## 2019-03-27 LAB — COMPREHENSIVE METABOLIC PANEL
ALT: 26 U/L (ref 0–44)
AST: 16 U/L (ref 15–41)
Albumin: 3.1 g/dL — ABNORMAL LOW (ref 3.5–5.0)
Alkaline Phosphatase: 51 U/L (ref 38–126)
Anion gap: 9 (ref 5–15)
BUN: 12 mg/dL (ref 6–20)
CO2: 26 mmol/L (ref 22–32)
Calcium: 8.9 mg/dL (ref 8.9–10.3)
Chloride: 97 mmol/L — ABNORMAL LOW (ref 98–111)
Creatinine, Ser: 0.87 mg/dL (ref 0.61–1.24)
GFR calc Af Amer: >60 mL/min (ref >60)
GFR calc non Af Amer: >60 mL/min (ref >60)
Glucose, Bld: 100 mg/dL — ABNORMAL HIGH (ref 70–99)
Potassium: 4.5 mmol/L (ref 3.5–5.1)
Sodium: 132 mmol/L — ABNORMAL LOW (ref 135–145)
Total Bilirubin: 0.6 mg/dL (ref 0.3–1.2)
Total Protein: 6.5 g/dL (ref 6.5–8.1)

## 2019-03-27 LAB — MAGNESIUM: Magnesium: 2.1 mg/dL (ref 1.7–2.4)

## 2019-03-27 MED ORDER — PALONOSETRON HCL INJECTION 0.25 MG/5ML
0.2500 mg | Freq: Once | INTRAVENOUS | Status: AC
  Administered 2019-03-27: 12:00:00 0.25 mg via INTRAVENOUS
  Filled 2019-03-27: qty 5

## 2019-03-27 MED ORDER — HEPARIN SOD (PORK) LOCK FLUSH 100 UNIT/ML IV SOLN: 250.0000 [IU] | Freq: Once | INTRAVENOUS | Status: DC | PRN

## 2019-03-27 MED ORDER — SODIUM CHLORIDE 0.9 % IV SOLN
40.0000 mg/m2 | Freq: Once | INTRAVENOUS | Status: AC
  Administered 2019-03-27: 14:00:00 79 mg via INTRAVENOUS
  Filled 2019-03-27: qty 79

## 2019-03-27 MED ORDER — SODIUM CHLORIDE 0.9% FLUSH
10.0000 mL | INTRAVENOUS | Status: DC | PRN
  Administered 2019-03-27: 10 mL

## 2019-03-27 MED ORDER — SODIUM CHLORIDE 0.9 % IV SOLN
150.0000 mg | Freq: Once | INTRAVENOUS | Status: AC
  Administered 2019-03-27: 150 mg via INTRAVENOUS
  Filled 2019-03-27: qty 150

## 2019-03-27 MED ORDER — SODIUM CHLORIDE 0.9 % IV SOLN: INTRAVENOUS | Status: DC

## 2019-03-27 MED ORDER — POTASSIUM CHLORIDE 2 MEQ/ML IV SOLN
Freq: Once | INTRAVENOUS | Status: AC
  Filled 2019-03-27: qty 10

## 2019-03-27 MED ORDER — SODIUM CHLORIDE 0.9 % IV SOLN
10.0000 mg | Freq: Once | INTRAVENOUS | Status: AC
  Administered 2019-03-27: 13:00:00 10 mg via INTRAVENOUS
  Filled 2019-03-27: qty 10

## 2019-03-27 MED ORDER — SODIUM CHLORIDE 0.9 % IV SOLN: Freq: Once | INTRAVENOUS | Status: AC

## 2019-03-27 NOTE — Progress Notes (Signed)
[name], [place] [id number]   CLINIC:  Medical Oncology/Hematology  PCP:  [name], [place] [address] [phone]   REASON FOR VISIT:  Follow-up for supraglottic squamous cell carcinoma.  CURRENT THERAPY: Weekly cisplatin.  BRIEF ONCOLOGIC HISTORY:  Oncology History  Malignant neoplasm of supraglottis ([place])  01/22/2019 Initial Diagnosis   Malignant neoplasm of supraglottis ([place])   01/30/2019 Cancer Staging   Staging form: Larynx - Supraglottis, AJCC 8th Edition - Clinical: Stage IVA (cT2, cN2c, cM0) - Signed by [name], MD on 01/30/2019   03/07/2019 -  Chemotherapy   The patient had palonosetron (ALOXI) injection 0.25 mg, 0.25 mg, Intravenous,  Once, 3 of 7 cycles Administration: 0.25 mg (03/13/2019), 0.25 mg (03/07/2019), 0.25 mg (03/27/2019) CISplatin (PLATINOL) 79 mg in sodium chloride 0.9 % 250 mL chemo infusion, 40 mg/m2 = 79 mg, Intravenous,  Once, 3 of 7 cycles Administration: 79 mg (03/13/2019), 79 mg (03/07/2019), 79 mg (03/27/2019) fosaprepitant (EMEND) 150 mg in sodium chloride 0.9 % 145 mL IVPB, 150 mg, Intravenous,  Once, 3 of 7 cycles Administration: 150 mg (03/13/2019), 150 mg (03/07/2019), 150 mg (03/27/2019)  for chemotherapy treatment.       CANCER STAGING: Cancer Staging Malignant neoplasm of supraglottis ([place]) Staging form: Larynx - Supraglottis, AJCC 8th Edition - Clinical: Stage IVA (cT2, cN2c, cM0) - Signed by [name], MD on 01/30/2019    INTERVAL HISTORY:  [name] 59 y.o. male seen for follow-up of for supraglottic squamous cell carcinoma.  Reports appetite 100%.  Energy levels are 75%.  Reported 2 episodes of diarrhea.  Gained about 1 pound since last visit.  Osmolite 4 cans/day via feeding tube.  He is eating soft foods and snacks.    REVIEW OF SYSTEMS:  Review of Systems  Gastrointestinal: Positive for diarrhea.  All other systems reviewed and are  negative.    PAST MEDICAL/SURGICAL HISTORY:  Past Medical History:  Diagnosis Date  . Anxiety   . Arthritis    back  . Bradycardia    a. During 11/2012 adm: lopressor decreased.  . Cancer of larynx ([place])   . Carotid artery stenosis   . 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 Coronary artery disease    a. Diag cath 10/2012 for CP/abnormal nuc -> planned PCI s/p LAD atherectomy/DES placement 11/05/12.  Marland Kitchen GERD (gastroesophageal reflux disease)   . History of hiatal hernia   . Hypercholesteremia   . Hypertension   . Stroke ([place]) 07/31/2018   bilateral vertebrobasilar occlusion; "left side weaker thanit was before."   Past Surgical History:  Procedure Laterality Date  . CARDIAC CATHETERIZATION  10/29/2012  . CORONARY ANGIOPLASTY WITH STENT PLACEMENT  11/05/2012  . DIRECT LARYNGOSCOPY N/A 01/02/2019   Procedure: MICRO DIRECT LARYNGOSCOPY BIOPSY OF LARYNGEAL MASS;  Surgeon: [name], MD;  Location: [facility];  Service: ENT;  Laterality: N/A;  . HEMORRHOID SURGERY  ~ 2010  . INSERTION OF MESH N/A 06/02/2015   Procedure: INSERTION OF MESH;  Surgeon: [name], MD;  Location: [facility];  Service: General;  Laterality: N/A;  . IR ANGIO INTRA EXTRACRAN SEL COM CAROTID INNOMINATE BILAT MOD SED  08/02/2018  . IR ANGIO VERTEBRAL SEL VERTEBRAL BILAT MOD SED  08/02/2018  . LAPAROSCOPIC INSERTION GASTROSTOMY TUBE N/A 02/24/2019   Procedure: LAPAROSCOPIC INSERTION GASTROSTOMY TUBE;  Surgeon: [name], MD;  Location: [facility];  Service: General;  Laterality: N/A;  . LEFT HEART CATHETERIZATION WITH CORONARY ANGIOGRAM N/A 10/30/2012   Procedure: LEFT HEART CATHETERIZATION WITH CORONARY ANGIOGRAM;  Surgeon: [name], MD;  Location: [facility];  Service: Cardiovascular;  Laterality: N/A;  . MULTIPLE EXTRACTIONS WITH ALVEOLOPLASTY N/A 02/18/2019   Procedure: Extraction of tooth #'s 5-7, 10,11,14,20-29, 31 and 32 with  alveoloplasty and maxiillary left buccal exostoses reductions;  Surgeon: [name], [name];  Location: [facility];  Service: Oral Surgery;  Laterality: N/A;  . PERCUTANEOUS CORONARY ROTOBLATOR INTERVENTION (PCI-R) N/A 11/05/2012   Procedure: PERCUTANEOUS CORONARY ROTOBLATOR INTERVENTION (PCI-R);  Surgeon: [name], MD;  Location: [facility] CATH LAB;  Service: Cardiovascular;  Laterality: N/A;  . TRACHEOSTOMY TUBE PLACEMENT N/A 02/18/2019   Procedure: Tracheostomy;  Surgeon: [name], MD;  Location: [facility];  Service: ENT;  Laterality: N/A;  . UMBILICAL HERNIA REPAIR N/A 06/02/2015   Procedure: UMBILICAL HERNIORRHAPHY WITH MESH;  Surgeon: [name], MD;  Location: [facility];  Service: General;  Laterality: N/A;     SOCIAL HISTORY:  Social History   Socioeconomic History  . Marital status: Married    Spouse name: Not on file  . Number of children: 4  . Years of education: Not on file  . Highest education level: Not on file  Occupational History  . Occupation: Disabled  Tobacco Use  . Smoking status: Former Smoker    Packs/day: 3.00    Years: 32.00    Pack years: 96.00    Types: Cigarettes    Start date: 08/29/1977    Quit date: 11/29/2006    Years since quitting: 12.3  . Smokeless tobacco: Former Systems developer    Types: Chew  . Tobacco comment: 11/05/2012 "chewed tobacco when I play ball; aien't chewed since age 61"  Substance and Sexual Activity  . Alcohol use: Yes    Alcohol/week: 12.0 standard drinks    Types: 12 Cans of beer per week    Comment: Currently 3-4 beers per day. 02/13/19, was a heavy drinker in the past, - states he could drink a case a day   . Drug use: Yes    Types: Marijuana  . Sexual activity: Not Currently  Other Topics Concern  . Not on file  Social History Narrative   Patient is disabled.  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   Patient quit smoking 10 to 12 years ago.  Patient previously 3 pack/day use.   Patient currently drinking 3-4  beers per day from 12 pack a day.   Patient denies use of chew or other illicit drugs.   Social Determinants of Health   Financial Resource Strain: Low Risk   . Difficulty of Paying Living Expenses: Not hard at all  Food Insecurity: No Food Insecurity  . Worried About Charity fundraiser in the Last Year: Never true  . Ran Out of Food in the Last Year: Never true  Transportation Needs: No Transportation Needs  . Lack of Transportation (Medical): No  . Lack of Transportation (Non-Medical): No  Physical Activity: Insufficiently Active  . Days of Exercise per Week: 2 days  . Minutes of Exercise per Session: 30 min  Stress: Stress Concern Present  . Feeling of Stress : To some extent  Social Connections: Moderately Isolated  . Frequency of Communication with Friends and Family: Once a week  . Frequency of Social Gatherings with Friends and Family: Once a week  . Attends Religious  Services: Never  . Active Member of Clubs or Organizations: No  . Attends Archivist Meetings: Never  . Marital Status: Married  Human resources officer Violence: Not At Risk  . Fear of Current or Ex-Partner: No  . Emotionally Abused: No  . Physically Abused: No  . Sexually Abused: No    FAMILY HISTORY:  Family History  Problem Relation Age of Onset  . Hypertension Mother   . Heart disease Father   . Hypertension Father   . Heart disease Sister   . Hypertension Maternal Grandmother   . Hypertension Maternal Grandfather   . Hypertension Paternal Grandmother   . Hypertension Paternal Grandfather     CURRENT MEDICATIONS:  Outpatient Encounter Medications as of 03/27/2019  Medication Sig Note  . amLODipine (NORVASC) 5 MG tablet Take 1 tablet (5 mg total) by mouth daily. (Patient not taking: Reported on 03/17/2019)   . aspirin EC 81 MG tablet Take 81 mg by mouth daily.   Marland Kitchen atorvastatin (LIPITOR) 80 MG tablet Take 1 tablet (80 mg total) by mouth every evening.   . Catheters (ARGYLE SUCTION CATHETER  14FR) MISC 1 Device by Does not apply route daily.   Marland Kitchen CISPLATIN IV Inject into the vein once a week. Weekly in conjunction with radiation therapy beginning 03/06/2019   . clopidogrel (PLAVIX) 75 MG tablet Take 1 tablet (75 mg total) by mouth daily. (Patient not taking: Reported on 02/17/2019) 03/06/2019: Pt will start back taking in a couple weeks per pt  . diphenoxylate-atropine (LOMOTIL) 2.5-0.025 MG tablet Take 2 tablets by mouth 4 (four) times daily as needed.   . DULoxetine (CYMBALTA) 30 MG capsule Take 30 mg by mouth daily.   . famotidine (PEPCID) 20 MG tablet Take 20 mg by mouth at bedtime.   Marland Kitchen HYDROcodone-acetaminophen (NORCO) 10-325 MG tablet Take 1 tablet by mouth every 4 (four) hours as needed.   Marland Kitchen levofloxacin (LEVAQUIN) 500 MG tablet Take 500 mg by mouth daily.   Marland Kitchen lidocaine (XYLOCAINE) 2 % solution Patient: Mix 1part 2% viscous lidocaine, 1part H20. Swish & swallow 75m of diluted mixture, 351m before meals and at bedtime, up to QID   . metoprolol tartrate (LOPRESSOR) 25 MG tablet TAKE 1/2 TABLET BY MOUTH TWICE DAILY   . Misc. Devices MISC 10 mL saline flushes - one box   . Misc. Devices MISC Osmolite 1.5 x 7 cartons daily 2 cartons at 8 am, 2 cartons at noon, 2 cartons at 4pm, 1 carton at 8pm Send bolus tube feeding supplies   . Misc. Devices MISC ProSource or equivalent 30 ml twice daily via feeding tube   . MONOJECT PREFILL ADVANCED NACL 0.9 % SOLN injection USE AS DIRECTED.A   . naproxen (NAPROSYN) 250 MG tablet Take 250 mg by mouth 2 (two) times daily.   . nitroGLYCERIN (NITROSTAT) 0.4 MG SL tablet Place 1 tablet (0.4 mg total) under the tongue every 5 (five) minutes as needed for chest pain (CP or SOB). (Patient not taking: Reported on 02/17/2019)   . Nutritional Supplements (FEEDING SUPPLEMENT, OSMOLITE 1.5 CAL,) LIQD Give 2 cartons of osmolite 1.5 at 9:30am, 1:30, 5:30 and 1 carton at 9:30pm.  Flush with 6087mf water before and 120m18mter each feeding. Meets 100% of patient needs.  Send bolus supplies.   . Nutritional Supplements (PROSOURCE TF) LIQD Give 30ml28mimes per day via feeding tube.  Mix with 60ml 24mater.  Flush tube with 60ml o41mter, then place prosource and water mixture in tube  and flush with 21m of water after.   .Marland Kitchenomeprazole (PRILOSEC) 20 MG capsule Take 20 mg by mouth daily.    . prochlorperazine (COMPAZINE) 10 MG tablet Take 1 tablet (10 mg total) by mouth every 6 (six) hours as needed (Nausea or vomiting).   . sodium chloride irrigation 0.9 % irrigation Irrigate with 200 mLs as directed continuous.   . Tracheostomy Care KIT 1 kit by Does not apply route daily.   . traMADol (ULTRAM) 50 MG tablet Take 50-100 mg by mouth 4 (four) times daily as needed (pain.).     No facility-administered encounter medications on file as of 03/27/2019.    ALLERGIES:  Allergies  Allergen Reactions  . Prednisone Other (See Comments)    Chest pain     PHYSICAL EXAM:  ECOG Performance status: 1  Vitals:   03/27/19 0913  BP: 126/71  Pulse: 71  Resp: 18  Temp: (!) 96.4 F (35.8 C)  SpO2: 98%   Filed Weights   03/27/19 0913  Weight: 167 lb 9.6 oz (76 kg)    Physical Exam Vitals reviewed.  Constitutional:      Appearance: Normal appearance.  Neck:     Comments: Tracheostomy site is not bleeding. Cardiovascular:     Rate and Rhythm: Normal rate and regular rhythm.     Heart sounds: Normal heart sounds.  Pulmonary:     Effort: Pulmonary effort is normal.     Breath sounds: Normal breath sounds.  Abdominal:     General: There is no distension.     Palpations: Abdomen is soft. There is no mass.     Comments: PEG site is within normal limits.  Musculoskeletal:        General: No swelling.  Skin:    General: Skin is warm.  Neurological:     General: No focal deficit present.     Mental Status: He is alert and oriented to person, place, and time.  Psychiatric:        Mood and Affect: Mood normal.        Behavior: Behavior normal.       LABORATORY DATA:  I have reviewed the labs as listed.  CBC    Component Value Date/Time   WBC 3.4 (L) 03/27/2019 0925   RBC 2.68 (L) 03/27/2019 0925   HGB 8.3 (L) 03/27/2019 0925   HCT 25.8 (L) 03/27/2019 0925   PLT 136 (L) 03/27/2019 0925   MCV 96.3 03/27/2019 0925   MCH 31.0 03/27/2019 0925   MCHC 32.2 03/27/2019 0925   RDW 14.9 03/27/2019 0925   LYMPHSABS 0.7 03/27/2019 0925   MONOABS 0.5 03/27/2019 0925   EOSABS 0.1 03/27/2019 0925   BASOSABS 0.0 03/27/2019 0925   CMP Latest Ref Rng & Units 03/27/2019 03/18/2019 03/17/2019  Glucose 70 - 99 mg/dL 100(H) 117(H) 136(H)  BUN 6 - 20 mg/dL '12 18 18  '$ Creatinine 0.61 - 1.24 mg/dL 0.87 0.92 1.12  Sodium 135 - 145 mmol/L 132(L) 133(L) 127(L)  Potassium 3.5 - 5.1 mmol/L 4.5 4.4 3.7  Chloride 98 - 111 mmol/L 97(L) 99 92(L)  CO2 22 - 32 mmol/L '26 25 24  '$ Calcium 8.9 - 10.3 mg/dL 8.9 8.4(L) 8.6(L)  Total Protein 6.5 - 8.1 g/dL 6.5 - 7.5  Total Bilirubin 0.3 - 1.2 mg/dL 0.6 - 0.7  Alkaline Phos 38 - 126 U/L 51 - 61  AST 15 - 41 U/L 16 - 44(H)  ALT 0 - 44 U/L 26 - 41  DIAGNOSTIC IMAGING:  I have reviewed scans.     ASSESSMENT & PLAN:   Malignant neoplasm of supraglottis ([place]) 1.  T3N2C supraglottic squamous cell carcinoma: -Laryngoscopy and biopsy on 01/02/2019.  Trach placed on 02/18/2019. -Weekly cisplatin and radiation on 03/07/2019. -He was hospitalized from 03/17/2019 through 03/18/2019 secondary to fever and Serratia infection. -Radiation started back on 03/19/2019.  I held his chemotherapy last week. -Today he feels very well.  I have reviewed his labs.  White count is 3.4 and platelet count is 136.  LFTs are adequate. -He will proceed with his chemotherapy today.  He will come back tomorrow for hydration. -We will see him back in 1 week for follow-up.  2.  Nutrition: -He is taking an Osmolite 1.54 cans daily.  He gained 1 pound. -He is eating soft foods and snacks.  He reports soreness in the throat.  3.   Renal insufficiency: -His creatinine today improved to 0.87.  4.  CVA: -He had pontine stroke and large vessel occlusion. -He has bleeding from trach site and Plavix was held since 03/04/2019.  He is continuing aspirin 81 mg daily.      Orders placed this encounter:  No orders of the defined types were placed in this encounter.    [name], MD [place] [phone]

## 2019-03-27 NOTE — Progress Notes (Signed)
Treatment given per orders. Patient tolerated it well without problems. Vitals stable and discharged home from clinic ambulatory. Follow up as scheduled.  

## 2019-03-27 NOTE — Progress Notes (Signed)
Labs reviewed with MD today. Proceed as planned and pt will return tomorrow for fluids per MD.

## 2019-03-27 NOTE — Assessment & Plan Note (Signed)
1.  T3N2C supraglottic squamous cell carcinoma: -Laryngoscopy and biopsy on 01/02/2019.  Trach placed on 02/18/2019. -Weekly cisplatin and radiation on 03/07/2019. -He was hospitalized from 03/17/2019 through 03/18/2019 secondary to fever and Serratia infection. -Radiation started back on 03/19/2019.  I held his chemotherapy last week. -Today he feels very well.  I have reviewed his labs.  White count is 3.4 and platelet count is 136.  LFTs are adequate. -He will proceed with his chemotherapy today.  He will come back tomorrow for hydration. -We will see him back in 1 week for follow-up.  2.  Nutrition: -He is taking an Osmolite 1.54 cans daily.  He gained 1 pound. -He is eating soft foods and snacks.  He reports soreness in the throat.  3.  Renal insufficiency: -His creatinine today improved to 0.87.  4.  CVA: -He had pontine stroke and large vessel occlusion. -He has bleeding from trach site and Plavix was held since 03/04/2019.  He is continuing aspirin 81 mg daily.

## 2019-03-27 NOTE — Progress Notes (Signed)
Patient has been assessed, vital signs and labs have been reviewed by Dr. Delton Coombes. ANC, Creatinine, LFTs, and Platelets are within treatment parameters per Dr. Delton Coombes. The patient is good to proceed with treatment at this time. He will come back tomorrow for 1 liter of fluids with electrolytes over 2 hours.

## 2019-03-27 NOTE — Patient Instructions (Signed)
[facility] Discharge Instructions for Patients Receiving Chemotherapy  Today you received the following chemotherapy agents   To help prevent nausea and vomiting after your treatment, we encourage you to take your nausea medication   If you develop nausea and vomiting that is not controlled by your nausea medication, call the clinic.   BELOW ARE SYMPTOMS THAT SHOULD BE REPORTED IMMEDIATELY:  *FEVER GREATER THAN 100.5 F  *CHILLS WITH OR WITHOUT FEVER  NAUSEA AND VOMITING THAT IS NOT CONTROLLED WITH YOUR NAUSEA MEDICATION  *UNUSUAL SHORTNESS OF BREATH  *UNUSUAL BRUISING OR BLEEDING  TENDERNESS IN MOUTH AND THROAT WITH OR WITHOUT PRESENCE OF ULCERS  *URINARY PROBLEMS  *BOWEL PROBLEMS  UNUSUAL RASH Items with * indicate a potential emergency and should be followed up as soon as possible.  Feel free to call the clinic should you have any questions or concerns. The clinic phone number is [phone].  Please show the CHEMO ALERT CARD at check-in to the Emergency Department and triage nurse.   

## 2019-03-27 NOTE — Patient Instructions (Signed)
[place] at [facility] Discharge Instructions  You were seen today by Dr. Delton Coombes. He went over your recent lab results. Fluids tomorrow. He will see you back in 1 week for labs, treatment and follow up.   Thank you for choosing [place] at [facility] to provide your oncology and hematology care.  To afford each patient quality time with our provider, please arrive at least 15 minutes before your scheduled appointment time.   If you have a lab appointment with the [facility] please come in thru the  Main Entrance and check in at the main information desk  You need to re-schedule your appointment should you arrive 10 or more minutes late.  We strive to give you quality time with our providers, and arriving late affects you and other patients whose appointments are after yours.  Also, if you no show three or more times for appointments you may be dismissed from the clinic at the providers discretion.     Again, thank you for choosing [facility].  Our hope is that these requests will decrease the amount of time that you wait before being seen by our physicians.       _____________________________________________________________  Should you have questions after your visit to [facility], please contact our office at [phone] between the hours of 8:00 a.m. and 4:30 p.m.  Voicemails left after 4:00 p.m. will not be returned until the following business day.  For prescription refill requests, have your pharmacy contact our office and allow 72 hours.    Cancer Center Support Programs:   > Cancer Support Group  2nd Tuesday of the month 1pm-2pm, Journey Room

## 2019-03-28 ENCOUNTER — Inpatient Hospital Stay (HOSPITAL_COMMUNITY): Payer: 59

## 2019-03-28 ENCOUNTER — Ambulatory Visit
Admission: RE | Admit: 2019-03-28 | Discharge: 2019-03-28 | Disposition: A | Discharge status: Home / Self Care | Payer: 59 | Source: Ambulatory Visit | Attending: Radiation Oncology | Admitting: Radiation Oncology

## 2019-03-28 ENCOUNTER — Other Ambulatory Visit: Payer: Self-pay

## 2019-03-28 VITALS — HR 60 | Temp 96.9°F | Resp 18

## 2019-03-28 DIAGNOSIS — Z5111 Encounter for antineoplastic chemotherapy: Secondary | ICD-10-CM | POA: Diagnosis not present

## 2019-03-28 DIAGNOSIS — E860 Dehydration: Secondary | ICD-10-CM | POA: Insufficient documentation

## 2019-03-28 DIAGNOSIS — C760 Malignant neoplasm of head, face and neck: Secondary | ICD-10-CM

## 2019-03-28 MED ORDER — SODIUM CHLORIDE 0.9 % IV SOLN
Freq: Once | INTRAVENOUS | Status: AC
  Filled 2019-03-28: qty 1000

## 2019-03-28 MED ORDER — SODIUM CHLORIDE 0.9% FLUSH
10.0000 mL | Freq: Once | INTRAVENOUS | Status: AC | PRN
  Administered 2019-03-28: 12:00:00 10 mL

## 2019-03-28 MED ORDER — HEPARIN SOD (PORK) LOCK FLUSH 100 UNIT/ML IV SOLN
250.0000 [IU] | Freq: Once | INTRAVENOUS | Status: AC | PRN
  Administered 2019-03-28: 250 [IU]

## 2019-03-28 NOTE — Progress Notes (Signed)
Hydration fluids given today per orders. Patient tolerated it well without problems. Vitals stable and discharged home from clinic ambulatory. Follow up as scheduled.

## 2019-03-30 NOTE — Progress Notes (Signed)

## 2019-03-31 ENCOUNTER — Ambulatory Visit
Admission: RE | Admit: 2019-03-31 | Discharge: 2019-03-31 | Disposition: A | Discharge status: Home / Self Care | Payer: 59 | Source: Ambulatory Visit | Attending: Radiation Oncology | Admitting: Radiation Oncology

## 2019-03-31 ENCOUNTER — Other Ambulatory Visit: Payer: Self-pay

## 2019-03-31 ENCOUNTER — Other Ambulatory Visit (HOSPITAL_COMMUNITY): Payer: Self-pay | Admitting: Hematology

## 2019-04-01 ENCOUNTER — Ambulatory Visit
Admission: RE | Admit: 2019-04-01 | Discharge: 2019-04-01 | Disposition: A | Discharge status: Home / Self Care | Payer: 59 | Source: Ambulatory Visit | Attending: Radiation Oncology | Admitting: Radiation Oncology

## 2019-04-01 ENCOUNTER — Ambulatory Visit (HOSPITAL_COMMUNITY): Payer: 59

## 2019-04-01 ENCOUNTER — Other Ambulatory Visit (HOSPITAL_COMMUNITY): Payer: 59

## 2019-04-01 ENCOUNTER — Inpatient Hospital Stay (HOSPITAL_COMMUNITY)
Admission: EM | Admit: 2019-04-01 | Discharge: 2019-04-05 | DRG: 378 | Disposition: A | Discharge status: Home Health | Payer: 59 | Source: Ambulatory Visit | Attending: Internal Medicine | Admitting: Internal Medicine

## 2019-04-01 ENCOUNTER — Ambulatory Visit (HOSPITAL_COMMUNITY): Payer: 59 | Admitting: Hematology

## 2019-04-01 ENCOUNTER — Emergency Department (HOSPITAL_COMMUNITY): Payer: 59

## 2019-04-01 ENCOUNTER — Encounter (HOSPITAL_COMMUNITY): Payer: Self-pay | Admitting: *Deleted

## 2019-04-01 ENCOUNTER — Other Ambulatory Visit: Payer: Self-pay

## 2019-04-01 DIAGNOSIS — I69354 Hemiplegia and hemiparesis following cerebral infarction affecting left non-dominant side: Secondary | ICD-10-CM

## 2019-04-01 DIAGNOSIS — E785 Hyperlipidemia, unspecified: Secondary | ICD-10-CM | POA: Diagnosis present

## 2019-04-01 DIAGNOSIS — Z7982 Long term (current) use of aspirin: Secondary | ICD-10-CM

## 2019-04-01 DIAGNOSIS — R778 Other specified abnormalities of plasma proteins: Secondary | ICD-10-CM | POA: Diagnosis not present

## 2019-04-01 DIAGNOSIS — D72819 Decreased white blood cell count, unspecified: Secondary | ICD-10-CM | POA: Diagnosis present

## 2019-04-01 DIAGNOSIS — Z20822 Contact with and (suspected) exposure to covid-19: Secondary | ICD-10-CM | POA: Diagnosis present

## 2019-04-01 DIAGNOSIS — K219 Gastro-esophageal reflux disease without esophagitis: Secondary | ICD-10-CM | POA: Diagnosis present

## 2019-04-01 DIAGNOSIS — I959 Hypotension, unspecified: Secondary | ICD-10-CM | POA: Diagnosis present

## 2019-04-01 DIAGNOSIS — Z8249 Family history of ischemic heart disease and other diseases of the circulatory system: Secondary | ICD-10-CM

## 2019-04-01 DIAGNOSIS — Z931 Gastrostomy status: Secondary | ICD-10-CM

## 2019-04-01 DIAGNOSIS — I2510 Atherosclerotic heart disease of native coronary artery without angina pectoris: Secondary | ICD-10-CM | POA: Diagnosis present

## 2019-04-01 DIAGNOSIS — E860 Dehydration: Secondary | ICD-10-CM | POA: Diagnosis present

## 2019-04-01 DIAGNOSIS — D696 Thrombocytopenia, unspecified: Secondary | ICD-10-CM | POA: Diagnosis present

## 2019-04-01 DIAGNOSIS — R710 Precipitous drop in hematocrit: Secondary | ICD-10-CM | POA: Diagnosis not present

## 2019-04-01 DIAGNOSIS — Z8701 Personal history of pneumonia (recurrent): Secondary | ICD-10-CM

## 2019-04-01 DIAGNOSIS — D630 Anemia in neoplastic disease: Secondary | ICD-10-CM | POA: Diagnosis present

## 2019-04-01 DIAGNOSIS — F419 Anxiety disorder, unspecified: Secondary | ICD-10-CM | POA: Diagnosis present

## 2019-04-01 DIAGNOSIS — R0902 Hypoxemia: Secondary | ICD-10-CM | POA: Diagnosis present

## 2019-04-01 DIAGNOSIS — R197 Diarrhea, unspecified: Secondary | ICD-10-CM | POA: Diagnosis present

## 2019-04-01 DIAGNOSIS — E440 Moderate protein-calorie malnutrition: Secondary | ICD-10-CM | POA: Diagnosis present

## 2019-04-01 DIAGNOSIS — Z888 Allergy status to other drugs, medicaments and biological substances status: Secondary | ICD-10-CM

## 2019-04-01 DIAGNOSIS — I10 Essential (primary) hypertension: Secondary | ICD-10-CM | POA: Diagnosis present

## 2019-04-01 DIAGNOSIS — R195 Other fecal abnormalities: Secondary | ICD-10-CM | POA: Diagnosis not present

## 2019-04-01 DIAGNOSIS — I248 Other forms of acute ischemic heart disease: Secondary | ICD-10-CM | POA: Diagnosis present

## 2019-04-01 DIAGNOSIS — K922 Gastrointestinal hemorrhage, unspecified: Secondary | ICD-10-CM | POA: Diagnosis present

## 2019-04-01 DIAGNOSIS — Z87891 Personal history of nicotine dependence: Secondary | ICD-10-CM

## 2019-04-01 DIAGNOSIS — M545 Low back pain: Secondary | ICD-10-CM | POA: Diagnosis present

## 2019-04-01 DIAGNOSIS — D649 Anemia, unspecified: Secondary | ICD-10-CM | POA: Diagnosis not present

## 2019-04-01 DIAGNOSIS — Z9119 Patient's noncompliance with other medical treatment and regimen: Secondary | ICD-10-CM

## 2019-04-01 DIAGNOSIS — Z6825 Body mass index (BMI) 25.0-25.9, adult: Secondary | ICD-10-CM

## 2019-04-01 DIAGNOSIS — R1310 Dysphagia, unspecified: Secondary | ICD-10-CM | POA: Diagnosis present

## 2019-04-01 DIAGNOSIS — E876 Hypokalemia: Secondary | ICD-10-CM | POA: Diagnosis present

## 2019-04-01 DIAGNOSIS — Z930 Tracheostomy status: Secondary | ICD-10-CM

## 2019-04-01 DIAGNOSIS — C321 Malignant neoplasm of supraglottis: Secondary | ICD-10-CM | POA: Diagnosis present

## 2019-04-01 DIAGNOSIS — Z7902 Long term (current) use of antithrombotics/antiplatelets: Secondary | ICD-10-CM

## 2019-04-01 DIAGNOSIS — F329 Major depressive disorder, single episode, unspecified: Secondary | ICD-10-CM | POA: Diagnosis present

## 2019-04-01 DIAGNOSIS — G8929 Other chronic pain: Secondary | ICD-10-CM | POA: Diagnosis present

## 2019-04-01 DIAGNOSIS — K921 Melena: Secondary | ICD-10-CM | POA: Diagnosis not present

## 2019-04-01 DIAGNOSIS — Z955 Presence of coronary angioplasty implant and graft: Secondary | ICD-10-CM

## 2019-04-01 DIAGNOSIS — Z79899 Other long term (current) drug therapy: Secondary | ICD-10-CM

## 2019-04-01 DIAGNOSIS — R55 Syncope and collapse: Secondary | ICD-10-CM | POA: Diagnosis not present

## 2019-04-01 LAB — CBC WITH DIFFERENTIAL/PLATELET
Abs Immature Granulocytes: 0.01 10*3/uL (ref 0.00–0.07)
Basophils Absolute: 0 10*3/uL (ref 0.0–0.1)
Basophils Relative: 0 %
Eosinophils Absolute: 0 10*3/uL (ref 0.0–0.5)
Eosinophils Relative: 1 %
HCT: 20.3 % — ABNORMAL LOW (ref 39.0–52.0)
Hemoglobin: 6.4 g/dL — CL (ref 13.0–17.0)
Immature Granulocytes: 0 %
Lymphocytes Relative: 15 %
Lymphs Abs: 0.5 10*3/uL — ABNORMAL LOW (ref 0.7–4.0)
MCH: 31.1 pg (ref 26.0–34.0)
MCHC: 31.5 g/dL (ref 30.0–36.0)
MCV: 98.5 fL (ref 80.0–100.0)
Monocytes Absolute: 0.4 10*3/uL (ref 0.1–1.0)
Monocytes Relative: 12 %
Neutro Abs: 2.3 10*3/uL (ref 1.7–7.7)
Neutrophils Relative %: 72 %
Platelets: 144 10*3/uL — ABNORMAL LOW (ref 150–400)
RBC: 2.06 MIL/uL — ABNORMAL LOW (ref 4.22–5.81)
RDW: 14.9 % (ref 11.5–15.5)
WBC: 3.2 10*3/uL — ABNORMAL LOW (ref 4.0–10.5)
nRBC: 0 % (ref 0.0–0.2)

## 2019-04-01 LAB — COMPREHENSIVE METABOLIC PANEL
ALT: 24 U/L (ref 0–44)
AST: 23 U/L (ref 15–41)
Albumin: 2.5 g/dL — ABNORMAL LOW (ref 3.5–5.0)
Alkaline Phosphatase: 41 U/L (ref 38–126)
Anion gap: 9 (ref 5–15)
BUN: 14 mg/dL (ref 6–20)
CO2: 19 mmol/L — ABNORMAL LOW (ref 22–32)
Calcium: 6.5 mg/dL — ABNORMAL LOW (ref 8.9–10.3)
Chloride: 108 mmol/L (ref 98–111)
Creatinine, Ser: 0.72 mg/dL (ref 0.61–1.24)
GFR calc Af Amer: >60 mL/min (ref >60)
GFR calc non Af Amer: >60 mL/min (ref >60)
Glucose, Bld: 126 mg/dL — ABNORMAL HIGH (ref 70–99)
Potassium: 2.8 mmol/L — ABNORMAL LOW (ref 3.5–5.1)
Sodium: 136 mmol/L (ref 135–145)
Total Bilirubin: 0.4 mg/dL (ref 0.3–1.2)
Total Protein: 4.9 g/dL — ABNORMAL LOW (ref 6.5–8.1)

## 2019-04-01 LAB — PREPARE RBC (CROSSMATCH)

## 2019-04-01 LAB — LACTIC ACID, PLASMA: Lactic Acid, Venous: 3 mmol/L (ref 0.5–1.9)

## 2019-04-01 LAB — TROPONIN I (HIGH SENSITIVITY)
Troponin I (High Sensitivity): 22 ng/L — ABNORMAL HIGH (ref <18)
Troponin I (High Sensitivity): 341 ng/L (ref <18)

## 2019-04-01 MED ORDER — DULOXETINE HCL 30 MG PO CPEP
30.0000 mg | ORAL_CAPSULE | Freq: Every day | ORAL | Status: DC
  Administered 2019-04-01 – 2019-04-05 (×5): 30 mg via ORAL
  Filled 2019-04-01 (×5): qty 1

## 2019-04-01 MED ORDER — SODIUM CHLORIDE 0.9 % IV SOLN: INTRAVENOUS | Status: DC

## 2019-04-01 MED ORDER — ONDANSETRON HCL 4 MG/2ML IJ SOLN
4.0000 mg | Freq: Four times a day (QID) | INTRAMUSCULAR | Status: DC | PRN
  Administered 2019-04-02 – 2019-04-05 (×3): 4 mg via INTRAVENOUS
  Filled 2019-04-01 (×3): qty 2

## 2019-04-01 MED ORDER — POTASSIUM CHLORIDE 10 MEQ/100ML IV SOLN
10.0000 meq | INTRAVENOUS | Status: AC
  Administered 2019-04-01 (×4): 10 meq via INTRAVENOUS
  Filled 2019-04-01 (×4): qty 100

## 2019-04-01 MED ORDER — POTASSIUM CHLORIDE 20 MEQ/15ML (10%) PO SOLN
20.0000 meq | Freq: Every day | ORAL | Status: AC
  Administered 2019-04-01: 20 meq
  Filled 2019-04-01: qty 15

## 2019-04-01 MED ORDER — SODIUM CHLORIDE 0.9 % IV BOLUS
1000.0000 mL | Freq: Once | INTRAVENOUS | Status: AC
  Administered 2019-04-01: 1000 mL via INTRAVENOUS

## 2019-04-01 MED ORDER — POTASSIUM CHLORIDE CRYS ER 20 MEQ PO TBCR
40.0000 meq | EXTENDED_RELEASE_TABLET | Freq: Once | ORAL | Status: AC
  Administered 2019-04-01: 40 meq via ORAL
  Filled 2019-04-01: qty 2

## 2019-04-01 MED ORDER — PANTOPRAZOLE SODIUM 40 MG IV SOLR
40.0000 mg | Freq: Two times a day (BID) | INTRAVENOUS | Status: DC
  Administered 2019-04-02 – 2019-04-03 (×4): 40 mg via INTRAVENOUS
  Filled 2019-04-01 (×4): qty 40

## 2019-04-01 MED ORDER — TRAMADOL HCL 50 MG PO TABS
50.0000 mg | ORAL_TABLET | Freq: Four times a day (QID) | ORAL | Status: DC | PRN
  Administered 2019-04-02 – 2019-04-04 (×6): 100 mg
  Filled 2019-04-01 (×6): qty 2

## 2019-04-01 MED ORDER — TRAMADOL HCL 50 MG PO TABS: 50.0000 mg | ORAL_TABLET | Freq: Four times a day (QID) | ORAL | Status: DC | PRN

## 2019-04-01 MED ORDER — TRACHEOSTOMY CARE KIT
1.0000 | PACK | Freq: Every day | Status: DC
  Administered 2019-04-04: 1
  Filled 2019-04-01 (×5): qty 1

## 2019-04-01 MED ORDER — SODIUM CHLORIDE 0.9 % IV SOLN: 10.0000 mL/h | Freq: Once | INTRAVENOUS | Status: DC

## 2019-04-01 MED ORDER — ACETAMINOPHEN 160 MG/5ML PO SOLN
650.0000 mg | Freq: Four times a day (QID) | ORAL | Status: DC | PRN
  Administered 2019-04-04: 15:00:00 650 mg
  Filled 2019-04-01: qty 20.3

## 2019-04-01 NOTE — ED Notes (Signed)
orthos has already been done. Pt did not stand due to how low BP dropped just from sitting on edge of the bed.

## 2019-04-01 NOTE — ED Notes (Signed)
Delay in transport due to respiratory

## 2019-04-01 NOTE — ED Provider Notes (Addendum)
[facility] DEPT Provider Note   CSN: [id number] Arrival date & time: 04/01/19  1418     History Chief Complaint  Patient presents with  . hypotensive    [name] is a 59 y.o. male.  Cast35 year old male with history of squamous cell cancer who next presents from Center after having a syncopal event.  He was receiving radiation therapy and felt dizzy and lightheaded and passed out.  Proper response was called and blood pressure was 74/53.  Was given IV fluids and transported here.  Patient now has a blood pressure of 105.  States that he has been in his baseline state of health.  Denies any recent history of blood loss.  Not had any fever cough or congestion.  Denies any dyspnea.  No chest pain or abdominal discomfort.  No vomiting or diarrhea.  No recent changes to his medications.  Had a regular meal to eat today.        Past Medical History:  Diagnosis Date  . Anxiety   . Arthritis    back  . Bradycardia    a. During 11/2012 adm: lopressor decreased.  . Cancer of larynx ([place])   . Carotid artery stenosis   . 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 Coronary artery disease    a. Diag cath 10/2012 for CP/abnormal nuc -> planned PCI s/p LAD atherectomy/DES placement 11/05/12.  Marland Kitchen GERD (gastroesophageal reflux disease)   . History of hiatal hernia   . Hypercholesteremia   . Hypertension   . Stroke ([place]) 07/31/2018   bilateral vertebrobasilar occlusion; "left side weaker thanit was before."    Patient Active Problem List   Diagnosis Date Noted  . Dehydration 03/28/2019  . RLL pneumonia 03/17/2019  . Hx of tracheostomy 02/18/2019  . Head and neck cancer ([place]) 02/18/2019  . 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 Stroke ([place]) 07/31/2018  . Rotator cuff syndrome of right shoulder 02/20/2013  . Bradycardia 11/06/2012  . Coronary artery disease    . Abnormal finding on cardiovascular stress test 09/25/2012  . Hypertension 09/03/2012  . Chest pain 09/03/2012  . Alcohol abuse, daily use 09/03/2012  . Hyperlipidemia 09/03/2012  . Hyponatremia 09/03/2012    Past Surgical History:  Procedure Laterality Date  . CARDIAC CATHETERIZATION  10/29/2012  . CORONARY ANGIOPLASTY WITH STENT PLACEMENT  11/05/2012  . DIRECT LARYNGOSCOPY N/A 01/02/2019   Procedure: MICRO DIRECT LARYNGOSCOPY BIOPSY OF LARYNGEAL MASS;  Surgeon: [name], MD;  Location: [facility];  Service: ENT;  Laterality: N/A;  . HEMORRHOID SURGERY  ~ 2010  . INSERTION OF MESH N/A 06/02/2015   Procedure: INSERTION OF MESH;  Surgeon: [name], MD;  Location: [facility];  Service: General;  Laterality: N/A;  . IR ANGIO INTRA EXTRACRAN SEL COM CAROTID INNOMINATE BILAT MOD SED  08/02/2018  . IR ANGIO VERTEBRAL SEL VERTEBRAL BILAT MOD SED  08/02/2018  . LAPAROSCOPIC INSERTION GASTROSTOMY TUBE N/A 02/24/2019   Procedure: LAPAROSCOPIC INSERTION GASTROSTOMY TUBE;  Surgeon: [name], MD;  Location: [facility];  Service: General;  Laterality: N/A;  . LEFT HEART CATHETERIZATION WITH CORONARY ANGIOGRAM N/A 10/30/2012   Procedure: LEFT HEART CATHETERIZATION WITH CORONARY ANGIOGRAM;  Surgeon: [name], MD;  Location: [facility];  Service: Cardiovascular;  Laterality: N/A;  . MULTIPLE EXTRACTIONS WITH ALVEOLOPLASTY N/A 02/18/2019   Procedure: Extraction of tooth #'s 5-7, 10,11,14,20-29, 31  and 32 with alveoloplasty and maxiillary left buccal exostoses reductions;  Surgeon: [name], [name];  Location: [facility];  Service: Oral Surgery;  Laterality: N/A;  . PERCUTANEOUS CORONARY ROTOBLATOR INTERVENTION (PCI-R) N/A 11/05/2012   Procedure: PERCUTANEOUS CORONARY ROTOBLATOR INTERVENTION (PCI-R);  Surgeon: [name], MD;  Location: [facility];  Service: Cardiovascular;  Laterality: N/A;  . TRACHEOSTOMY TUBE PLACEMENT N/A 02/18/2019   Procedure: Tracheostomy;  Surgeon: [name], MD;  Location: [facility];  Service: ENT;  Laterality: N/A;  . UMBILICAL HERNIA REPAIR N/A 06/02/2015   Procedure: UMBILICAL HERNIORRHAPHY WITH MESH;  Surgeon: [name], MD;  Location: [facility];  Service: General;  Laterality: N/A;       Family History  Problem Relation Age of Onset  . Hypertension Mother   . Heart disease Father   . Hypertension Father   . Heart disease Sister   . Hypertension Maternal Grandmother   . Hypertension Maternal Grandfather   . Hypertension Paternal Grandmother   . Hypertension Paternal Grandfather     Social History   Tobacco Use  . Smoking status: Former Smoker    Packs/day: 3.00    Years: 32.00    Pack years: 96.00    Types: Cigarettes    Start date: 08/29/1977    Quit date: 11/29/2006    Years since quitting: 12.3  . Smokeless tobacco: Former Systems developer    Types: Chew  . Tobacco comment: 11/05/2012 "chewed tobacco when I play ball; aien't chewed since age 32"  Substance Use Topics  . Alcohol use: Yes    Alcohol/week: 12.0 standard drinks    Types: 12 Cans of beer per week    Comment: Currently 3-4 beers per day. 02/13/19, was a heavy drinker in the past, - states he could drink a case a day   . Drug use: Yes    Types: Marijuana    Home Medications Prior to Admission medications   Medication Sig Start Date End Date Taking? Authorizing Provider  amLODipine (NORVASC) 5 MG tablet Take 1 tablet (5 mg total) by mouth daily. Patient not taking: Reported on 03/17/2019 12/19/18   Frann Rider, NP  aspirin EC 81 MG tablet Take 81 mg by mouth daily.    [provider]  atorvastatin (LIPITOR) 80 MG tablet Take 1 tablet (80 mg total) by mouth every evening. 10/23/17   Herminio Commons, MD  Catheters (ARGYLE SUCTION CATHETER 14FR) MISC 1 Device by Does not apply route daily. 02/25/19   Harold Hedge, MD  CISPLATIN IV Inject into the vein once a week. Weekly in conjunction with radiation therapy beginning 03/06/2019 03/04/19   [provider]  clopidogrel  (PLAVIX) 75 MG tablet Take 1 tablet (75 mg total) by mouth daily. Patient not taking: Reported on 02/17/2019 12/24/18   Frann Rider, NP  diphenoxylate-atropine (LOMOTIL) 2.5-0.025 MG tablet Take 2 tablets by mouth 4 (four) times daily as needed. 03/24/19   [provider]  DULoxetine (CYMBALTA) 30 MG capsule Take 30 mg by mouth daily. 02/25/19   [provider]  famotidine (PEPCID) 20 MG tablet Take 20 mg by mouth at bedtime. 03/21/19   [provider]  HYDROcodone-acetaminophen (NORCO) 10-325 MG tablet Take 1 tablet by mouth every 4 (four) hours as needed. 06/02/15   [name], MD  levofloxacin (LEVAQUIN) 500 MG tablet Take 500 mg by mouth daily. 03/24/19   [provider]  lidocaine (XYLOCAINE) 2 % solution Patient: Mix 1part 2% viscous lidocaine, 1part H20. Swish &  swallow 91m of diluted mixture, 374m before meals and at bedtime, up to QID 03/10/19   SqEppie GibsonMD  metoprolol tartrate (LOPRESSOR) 25 MG tablet TAKE 1/2 TABLET BY MOUTH TWICE DAILY 01/25/15   KoHerminio CommonsMD  Misc. Devices MISC 10 mL saline flushes - one box 03/03/19   KaDerek JackMD  Misc. Devices MISC Osmolite 1.5 x 7 cartons daily 2 cartons at 8 am, 2 cartons at noon, 2 cartons at 4pm, 1 carton at 8pm Send bolus tube feeding supplies 03/21/19   Lockamy, Randi L, NP-C  Misc. Devices MISC ProSource or equivalent 30 ml twice daily via feeding tube 03/21/19   Lockamy, Randi L, NP-C  naproxen (NAPROSYN) 250 MG tablet Take 250 mg by mouth 2 (two) times daily. 02/25/19   [provider]  nitroGLYCERIN (NITROSTAT) 0.4 MG SL tablet Place 1 tablet (0.4 mg total) under the tongue every 5 (five) minutes as needed for chest pain (CP or SOB). Patient not taking: Reported on 02/17/2019 07/27/14   KoHerminio CommonsMD  Nutritional Supplements (FEEDING SUPPLEMENT, OSMOLITE 1.5 CAL,) LIQD Give 2 cartons of osmolite 1.5 at 9:30am, 1:30, 5:30 and 1 carton at 9:30pm.  Flush with 6018mf  water before and 120m20mter each feeding. Meets 100% of patient needs. Send bolus supplies. 03/19/19   SquiEppie Gibson  Nutritional Supplements (PROSOURCE TF) LIQD Give 30ml90mimes per day via feeding tube.  Mix with 60ml 3mater.  Flush tube with 60ml o21mter, then place prosource and water mixture in tube and flush with 60ml of85mer after. 03/19/19   Squire, Eppie Gibsoneprazole (PRILOSEC) 20 MG capsule Take 20 mg by mouth daily.  07/22/18   [provider]  prochlorperazine (COMPAZINE) 10 MG tablet Take 1 tablet (10 mg total) by mouth every 6 (six) hours as needed (Nausea or vomiting). 03/06/19   KatragadDerek Jackdium chloride 0.9 % infusion Use as directed to flush PICC line 03/31/19   KatragadDerek Jackdium chloride irrigation 0.9 % irrigation Irrigate with 200 mLs as directed continuous. 02/25/19   Segal, JHarold Hedgeacheostomy Care KIT 1 kit by Does not apply route daily. 02/25/19   Segal, JHarold HedgeaMADol (ULTRAM) 50 MG tablet Take 50-100 mg by mouth 4 (four) times daily as needed (pain.).  01/29/19   [provider]    Allergies    Prednisone  Review of Systems   Review of Systems  All other systems reviewed and are negative.   Physical Exam Updated Vital Signs BP 114/80   Pulse 92   Wt 76 kg   SpO2 99%   BMI 25.48 kg/m   Physical Exam Vitals and nursing note reviewed.  Constitutional:      General: He is not in acute distress.    Appearance: Normal appearance. He is well-developed. He is not toxic-appearing.  HENT:     Head: Normocephalic and atraumatic.  Eyes:     General: Lids are normal.     Conjunctiva/sclera: Conjunctivae normal.     Pupils: Pupils are equal, round, and reactive to light.  Neck:     Thyroid: No thyroid mass.     Trachea: No tracheal deviation.   Cardiovascular:     Rate and Rhythm: Normal rate and regular rhythm.     Heart sounds: Normal heart sounds. No murmur. No gallop.   Pulmonary:      Effort: Pulmonary effort is normal. No  respiratory distress.     Breath sounds: Normal breath sounds. No stridor. No decreased breath sounds, wheezing, rhonchi or rales.  Abdominal:     General: Bowel sounds are normal. There is no distension.     Palpations: Abdomen is soft.     Tenderness: There is no abdominal tenderness. There is no rebound.  Musculoskeletal:        General: No tenderness. Normal range of motion.     Cervical back: Normal range of motion and neck supple.  Skin:    General: Skin is warm and dry.     Findings: No abrasion or rash.  Neurological:     General: No focal deficit present.     Mental Status: He is alert and oriented to person, place, and time.     GCS: GCS eye subscore is 4. GCS verbal subscore is 5. GCS motor subscore is 6.     Cranial Nerves: No cranial nerve deficit.     Sensory: No sensory deficit.  Psychiatric:        Attention and Perception: Attention normal.        Speech: Speech normal.        Behavior: Behavior normal.     ED Results / Procedures / Treatments   Labs (all labs ordered are listed, but only abnormal results are displayed) Labs Reviewed - No data to display  EKG EKG Interpretation  Date/Time:  Tuesday April 01 2019 14:47:21 EDT Ventricular Rate:  89 PR Interval:    QRS Duration: 111 QT Interval:  379 QTC Calculation: 462 R Axis:   78 Text Interpretation: Sinus rhythm Confirmed by Lacretia Leigh ([id number]) on 04/01/2019 4:44:08 PM   Radiology No results found.  Procedures Procedures (including critical care time)  Medications Ordered in ED Medications  sodium chloride 0.9 % bolus 1,000 mL (has no administration in time range)  0.9 %  sodium chloride infusion (has no administration in time range)    ED Course  I have reviewed the triage vital signs and the nursing notes.  Pertinent labs & imaging results that were available during my care of the patient were reviewed by me and considered in my medical decision  making (see chart for details).    MDM Rules/Calculators/A&P                      Patient stool is guaiac positive here.  Hemoglobin is 6.4 and suspect upper GI source as patient has had dark stools.  Patient denies having any hematemesis.  2 units of blood ordered.  Spoke with [facility] and they will see in consultation.  Will admit to the hospitalist service Final Clinical Impression(s) / ED Diagnoses Final diagnoses:  None    Rx / DC Orders ED Discharge Orders    None       Lacretia Leigh, MD 04/01/19 1643    Lacretia Leigh, MD 04/01/19 1645    Lacretia Leigh, MD 04/01/19 1654

## 2019-04-01 NOTE — H&P (Addendum)
History and Physical  COSBY PROBY FGH:[id number] DOB: 09/29/1960 DOA: 04/01/2019  Referring physician: Dr. [name] PCP: [name], MD  Outpatient Specialists: Oncology Patient coming from: Sent from oncology's office Chief Complaint: Passed out   HPI: [name] is a 59 y.o. male with medical history significant for malignant neoplasm of supraglottis post tracheostomy, dysphagia status post PEG tube placement, coronary artery disease status post PCI with stenting in 2014, CVA, chronic lower back pain, chronic anxiety/depression, essential hypertension, hyperlipidemia who presented to [facility] ED sent from his oncologist office after passing out.  He was receiving radiation therapy, felt lightheaded and passed out.  This has happened in the past, received IV fluid with improvement.  States he was in his usual state of health prior to this.  He drove himself to radiation today.  Reports more frequent dark watery stools.  Denies nausea or abdominal pain.  Denies use of NSAIDs, states he used Tylenol for his arthritis and lower back pain.  No prior colonoscopy.  On presentation to the ED hypotensive and tachycardic.  Work-up revealed drop in hemoglobin down to 6.4.  Positive FOBT.  GI called for consult.  TRH asked to admit.  ED Course: Started on IV fluids and 2 units PRBCs ordered for transfusion.  Review of Systems: Review of systems as noted in the HPI. All other systems reviewed and are negative.   Past Medical History:  Diagnosis Date  . Anxiety   . Arthritis    back  . Bradycardia    a. During 11/2012 adm: lopressor decreased.  . Cancer of larynx ([place])   . Carotid artery stenosis   . 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 Coronary artery disease    a. Diag cath 10/2012 for CP/abnormal nuc -> planned PCI s/p LAD atherectomy/DES placement 11/05/12.  Marland Kitchen GERD (gastroesophageal reflux disease)   . History  of hiatal hernia   . Hypercholesteremia   . Hypertension   . Stroke ([place]) 07/31/2018   bilateral vertebrobasilar occlusion; "left side weaker thanit was before."   Past Surgical History:  Procedure Laterality Date  . CARDIAC CATHETERIZATION  10/29/2012  . CORONARY ANGIOPLASTY WITH STENT PLACEMENT  11/05/2012  . DIRECT LARYNGOSCOPY N/A 01/02/2019   Procedure: MICRO DIRECT LARYNGOSCOPY BIOPSY OF LARYNGEAL MASS;  Surgeon: [name], MD;  Location: [place] [facility];  Service: ENT;  Laterality: N/A;  . HEMORRHOID SURGERY  ~ 2010  . INSERTION OF MESH N/A 06/02/2015   Procedure: INSERTION OF MESH;  Surgeon: [name], MD;  Location: [facility];  Service: General;  Laterality: N/A;  . IR ANGIO INTRA EXTRACRAN SEL COM CAROTID INNOMINATE BILAT MOD SED  08/02/2018  . IR ANGIO VERTEBRAL SEL VERTEBRAL BILAT MOD SED  08/02/2018  . LAPAROSCOPIC INSERTION GASTROSTOMY TUBE N/A 02/24/2019   Procedure: LAPAROSCOPIC INSERTION GASTROSTOMY TUBE;  Surgeon: [name], MD;  Location: [place];  Service: General;  Laterality: N/A;  . LEFT HEART CATHETERIZATION WITH CORONARY ANGIOGRAM N/A 10/30/2012   Procedure: LEFT HEART CATHETERIZATION WITH CORONARY ANGIOGRAM;  Surgeon: [name], MD;  Location: [place] CATH [facility];  Service: Cardiovascular;  Laterality: N/A;  . MULTIPLE EXTRACTIONS WITH ALVEOLOPLASTY N/A 02/18/2019   Procedure: Extraction of tooth #'s 5-7, 10,11,14,20-29, 31 and 32 with alveoloplasty and maxiillary left buccal exostoses reductions;  Surgeon: [name], [name];  Location: [place];  Service: Oral Surgery;  Laterality: N/A;  . PERCUTANEOUS CORONARY  ROTOBLATOR INTERVENTION (PCI-R) N/A 11/05/2012   Procedure: PERCUTANEOUS CORONARY ROTOBLATOR INTERVENTION (PCI-R);  Surgeon: [name], MD;  Location: [facility] C[facility];  Service: Cardiovascular;  Laterality: N/A;  . TRACHEOSTOMY TUBE PLACEMENT N/A 02/18/2019   Procedure: Tracheostomy;  Surgeon: [name], MD;  Location: [place];  Service: ENT;  Laterality: N/A;  .  UMBILICAL HERNIA REPAIR N/A 06/02/2015   Procedure: UMBILICAL HERNIORRHAPHY WITH MESH;  Surgeon: [name], MD;  Location: [facility];  Service: General;  Laterality: N/A;    Social History:  reports that he quit smoking about 12 years ago. His smoking use included cigarettes. He started smoking about 41 years ago. He has a 96.00 pack-year smoking history. He has quit using smokeless tobacco.  His smokeless tobacco use included chew. He reports current alcohol use of about 12.0 standard drinks of alcohol per week. He reports current drug use. Drug: Marijuana.   Allergies  Allergen Reactions  . Prednisone Other (See Comments)    Chest pain    Family History  Problem Relation Age of Onset  . Hypertension Mother   . Heart disease Father   . Hypertension Father   . Heart disease Sister   . Hypertension Maternal Grandmother   . Hypertension Maternal Grandfather   . Hypertension Paternal Grandmother   . Hypertension Paternal Grandfather       Prior to Admission medications   Medication Sig Start Date End Date Taking? Authorizing Provider  amLODipine (NORVASC) 5 MG tablet Take 1 tablet (5 mg total) by mouth daily. Patient not taking: Reported on 03/17/2019 12/19/18   Frann Rider, NP  aspirin EC 81 MG tablet Take 81 mg by mouth daily.    [provider]  atorvastatin (LIPITOR) 80 MG tablet Take 1 tablet (80 mg total) by mouth every evening. 10/23/17   Herminio Commons, MD  Catheters (ARGYLE SUCTION CATHETER 14FR) MISC 1 Device by Does not apply route daily. 02/25/19   Harold Hedge, MD  CISPLATIN IV Inject into the vein once a week. Weekly in conjunction with radiation therapy beginning 03/06/2019 03/04/19   [provider]  clopidogrel (PLAVIX) 75 MG tablet Take 1 tablet (75 mg total) by mouth daily. Patient not taking: Reported on 02/17/2019 12/24/18   Frann Rider, NP  diphenoxylate-atropine (LOMOTIL) 2.5-0.025 MG tablet Take 2 tablets by mouth 4 (four) times daily  as needed. 03/24/19   [provider]  DULoxetine (CYMBALTA) 30 MG capsule Take 30 mg by mouth daily. 02/25/19   [provider]  famotidine (PEPCID) 20 MG tablet Take 20 mg by mouth at bedtime. 03/21/19   [provider]  HYDROcodone-acetaminophen (NORCO) 10-325 MG tablet Take 1 tablet by mouth every 4 (four) hours as needed. 06/02/15   [name], MD  levofloxacin (LEVAQUIN) 500 MG tablet Take 500 mg by mouth daily. 03/24/19   [provider]  lidocaine (XYLOCAINE) 2 % solution Patient: Mix 1part 2% viscous lidocaine, 1part H20. Swish & swallow 14m of diluted mixture, 378m before meals and at bedtime, up to QID 03/10/19   SqEppie GibsonMD  metoprolol tartrate (LOPRESSOR) 25 MG tablet TAKE 1/2 TABLET BY MOUTH TWICE DAILY 01/25/15   KoHerminio CommonsMD  Misc. Devices MISC 10 mL saline flushes - one box 03/03/19   KaDerek JackMD  Misc. Devices MISC Osmolite 1.5 x 7 cartons daily 2 cartons at 8 am, 2 cartons at noon, 2 cartons at 4pm, 1 carton at 8pm Send bolus tube feeding supplies 03/21/19  Lockamy, Randi L, NP-C  Misc. Devices MISC ProSource or equivalent 30 ml twice daily via feeding tube 03/21/19   Lockamy, Randi L, NP-C  naproxen (NAPROSYN) 250 MG tablet Take 250 mg by mouth 2 (two) times daily. 02/25/19   [provider]  nitroGLYCERIN (NITROSTAT) 0.4 MG SL tablet Place 1 tablet (0.4 mg total) under the tongue every 5 (five) minutes as needed for chest pain (CP or SOB). Patient not taking: Reported on 02/17/2019 07/27/14   Herminio Commons, MD  Nutritional Supplements (FEEDING SUPPLEMENT, OSMOLITE 1.5 CAL,) LIQD Give 2 cartons of osmolite 1.5 at 9:30am, 1:30, 5:30 and 1 carton at 9:30pm.  Flush with 35m of water before and 1235mafter each feeding. Meets 100% of patient needs. Send bolus supplies. 03/19/19   SqEppie GibsonMD  Nutritional Supplements (PROSOURCE TF) LIQD Give 3025m times per day via feeding tube.  Mix with 39m19m water.   Flush tube with 39ml16mwater, then place prosource and water mixture in tube and flush with 39ml 61mater after. 03/19/19   SquireEppie Gibsonomeprazole (PRILOSEC) 20 MG capsule Take 20 mg by mouth daily.  07/22/18   [provider]  prochlorperazine (COMPAZINE) 10 MG tablet Take 1 tablet (10 mg total) by mouth every 6 (six) hours as needed (Nausea or vomiting). 03/06/19   KatragDerek Jacksodium chloride 0.9 % infusion Use as directed to flush PICC line 03/31/19   KatragDerek Jacksodium chloride irrigation 0.9 % irrigation Irrigate with 200 mLs as directed continuous. 02/25/19   Segal,Harold HedgeTracheostomy Care KIT 1 kit by Does not apply route daily. 02/25/19   Segal,Harold HedgetraMADol (ULTRAM) 50 MG tablet Take 50-100 mg by mouth 4 (four) times daily as needed (pain.).  01/29/19   [provider]    Physical Exam: BP (!) 124/110   Pulse (!) 103   Temp 97.9 F (36.6 C) (Oral)   Resp (!) 28   Wt 76 kg   SpO2 98%   BMI 25.48 kg/m   . General: 58 y.o21year-old male well developed well nourished in no acute distress.  Alert and oriented x3.  Post tracheostomy. . Cardiovascular: Regular rate and rhythm with no rubs or gallops.  No thyromegaly or JVD noted.  No lower extremity edema. 2/4 pulses in all 4 extremities. . RespMarland Kitchenratory: Clear to auscultation with no wheezes or rales. Good inspiratory effort. . Abdomen: Soft nontender nondistended with normal bowel sounds x4 quadrants.  PEG tube in place. . Muskuloskeletal: No cyanosis, clubbing or edema noted bilaterally . Neuro: CN II-XII intact, strength, sensation, reflexes . Skin: No ulcerative lesions noted or rashes . Psychiatry: Judgement and insight appear normal. Mood is appropriate for condition and setting          Labs on Admission:  Basic Metabolic Panel: Recent Labs  Lab 03/27/19 0925 04/01/19 1440  NA 132* 136  K 4.5 2.8*  CL 97* 108  CO2 26 19*  GLUCOSE 100* 126*  BUN 12 14   CREATININE 0.87 0.72  CALCIUM 8.9 6.5*  MG 2.1  --    Liver Function Tests: Recent Labs  Lab 03/27/19 0925 04/01/19 1440  AST 16 23  ALT 26 24  ALKPHOS 51 41  BILITOT 0.6 0.4  PROT 6.5 4.9*  ALBUMIN 3.1* 2.5*   No results for input(s): LIPASE, AMYLASE in the last 168 hours. No results for input(s): AMMONIA in the last  168 hours. CBC: Recent Labs  Lab 03/27/19 0925 04/01/19 1440  WBC 3.4* 3.2*  NEUTROABS 2.1 2.3  HGB 8.3* 6.4*  HCT 25.8* 20.3*  MCV 96.3 98.5  PLT 136* 144*   Cardiac Enzymes: No results for input(s): CKTOTAL, CKMB, CKMBINDEX, TROPONINI in the last 168 hours.  BNP (last 3 results) No results for input(s): BNP in the last 8760 hours.  ProBNP (last 3 results) No results for input(s): PROBNP in the last 8760 hours.  CBG: No results for input(s): GLUCAP in the last 168 hours.  Radiological Exams on Admission: DG Chest Port 1 View  Result Date: 04/01/2019 CLINICAL DATA:  Syncope EXAM: PORTABLE CHEST 1 VIEW COMPARISON:  03/17/2019 FINDINGS: Two frontal views of the chest demonstrates stable tracheostomy tube and right-sided PICC. Cardiac silhouette is unremarkable. No airspace disease, effusion, or pneumothorax. No acute bony abnormalities. IMPRESSION: 1. No acute intrathoracic process. Electronically Signed   By: Randa Ngo M.D.   On: 04/01/2019 15:04    EKG: I independently viewed the EKG done and my findings are as followed: No EKG available at the time of this exam.  Assessment/Plan Present on Admission: . GI bleed  Active Problems:   GI bleed   Presumed upper GI bleed Denies use of NSAIDs Presented with hemoglobin of 6.4 and positive FOBT Denies abdominal pain or nausea. 2 unit PRBC ordered in the ED for transfusion Repeat H&H posttransfusion GI consulted by EDP Start IV PPI twice daily N.p.o. after midnight for possible EGD  Diarrhea in the setting of gastric tube feeding Reports more frequent watery stools despite using Imodium  at home Leukopenia WBC 3.2K Rule out C. Difficile, follow GI panel Continue IV fluid hydration Start Lomotil once infective process is ruled out  Dysphagia status post PEG tube placement Dietitian consult to initiate PEG tube feeding tomorrow N.p.o. for now until assessed by GI  Anemia of chronic disease in the setting of malignancy Obtain iron studies, follow results Management per above  Hypokalemia Presented with potassium of 2.8 Replete Magnesium level 2.1 on 03/27/2019 Repeat BMP and magnesium in the morning  Syncope, suspect in the setting of dehydration Obtain twelve-lead EKG Obtain orthostatic vital signs PT OT to assess in the morning Continue IV fluid Fall precautions  Malignant neoplasm of supraglottis Follows with oncology and radiation oncology outpatient Please contact radiation oncology and inform of admission  Chronic anxiety/depression Resume home medication  Recent right lower lobe pneumonia Diagnosed 2 weeks ago No evidence of active infective process O2 sat 98% on room air   DVT prophylaxis: SCDs; chemical DVT prophylaxis contraindicated in the setting of suspected GI bleed  Code Status: Full code as stated by the patient himself  Family Communication: Updated the patient's wife at bedside.  Disposition Plan: Admit to telemetry unit  Consults called: GI consulted by EDP  Admission status: Inpatient status.    Kayleen Memos MD [facility] Pager [phone]  If 7PM-7AM, please contact night-coverage [email or web address] Password [facility]  04/01/2019, 6:03 PM

## 2019-04-01 NOTE — ED Notes (Signed)
Attempted to call report to Curt Bears, [place]. She is in a patient room and stated that she will call back.

## 2019-04-01 NOTE — ED Triage Notes (Signed)
Pt leaving Radiation, fell over on caregiver, Rapid response arrived, hypotensive 74/53 with AMS. He has had 21 treatments, has a trach due to throat cancer, pt has PICC line in rt arm. No other symptoms noted to lead to syncopal episode. Pt moved to ED for care.

## 2019-04-01 NOTE — ED Notes (Signed)
Date and time results received: 04/01/19 7:51 PM  (use smartphrase ".now" to insert current time)  Test: troponin Critical Value: 341  Name of Provider Notified: Irene Pap, DO  Orders Received? Or Actions Taken?: repeat troponin

## 2019-04-02 ENCOUNTER — Ambulatory Visit: Admission: RE | Admit: 2019-04-02 | Payer: 59 | Source: Ambulatory Visit

## 2019-04-02 ENCOUNTER — Inpatient Hospital Stay: Payer: 59

## 2019-04-02 DIAGNOSIS — D649 Anemia, unspecified: Secondary | ICD-10-CM

## 2019-04-02 DIAGNOSIS — R195 Other fecal abnormalities: Secondary | ICD-10-CM

## 2019-04-02 LAB — RETICULOCYTES
Immature Retic Fract: 14 % (ref 2.3–15.9)
RBC.: 2.72 MIL/uL — ABNORMAL LOW (ref 4.22–5.81)
Retic Count, Absolute: 19.6 10*3/uL (ref 19.0–186.0)
Retic Ct Pct: 0.7 % (ref 0.4–3.1)

## 2019-04-02 LAB — CBC
HCT: 25.8 % — ABNORMAL LOW (ref 39.0–52.0)
Hemoglobin: 8.4 g/dL — ABNORMAL LOW (ref 13.0–17.0)
MCH: 31 pg (ref 26.0–34.0)
MCHC: 32.6 g/dL (ref 30.0–36.0)
MCV: 95.2 fL (ref 80.0–100.0)
Platelets: 129 10*3/uL — ABNORMAL LOW (ref 150–400)
RBC: 2.71 MIL/uL — ABNORMAL LOW (ref 4.22–5.81)
RDW: 14.7 % (ref 11.5–15.5)
WBC: 3.3 10*3/uL — ABNORMAL LOW (ref 4.0–10.5)
nRBC: 0 % (ref 0.0–0.2)

## 2019-04-02 LAB — IRON AND TIBC
Iron: 36 ug/dL — ABNORMAL LOW (ref 45–182)
Saturation Ratios: 13 % — ABNORMAL LOW (ref 17.9–39.5)
TIBC: 288 ug/dL (ref 250–450)
UIBC: 252 ug/dL

## 2019-04-02 LAB — BASIC METABOLIC PANEL
Anion gap: 7 (ref 5–15)
BUN: 16 mg/dL (ref 6–20)
CO2: 22 mmol/L (ref 22–32)
Calcium: 8.2 mg/dL — ABNORMAL LOW (ref 8.9–10.3)
Chloride: 104 mmol/L (ref 98–111)
Creatinine, Ser: 0.9 mg/dL (ref 0.61–1.24)
GFR calc Af Amer: >60 mL/min (ref >60)
GFR calc non Af Amer: >60 mL/min (ref >60)
Glucose, Bld: 118 mg/dL — ABNORMAL HIGH (ref 70–99)
Potassium: 5.1 mmol/L (ref 3.5–5.1)
Sodium: 133 mmol/L — ABNORMAL LOW (ref 135–145)

## 2019-04-02 LAB — TROPONIN I (HIGH SENSITIVITY)
Troponin I (High Sensitivity): 468 ng/L (ref <18)
Troponin I (High Sensitivity): 464 ng/L (ref <18)
Troponin I (High Sensitivity): 415 ng/L (ref <18)

## 2019-04-02 LAB — SARS CORONAVIRUS 2 (TAT 6-24 HRS): SARS Coronavirus 2: NEGATIVE

## 2019-04-02 LAB — ABO/RH: ABO/RH(D): A POS

## 2019-04-02 LAB — FERRITIN: Ferritin: 626 ng/mL — ABNORMAL HIGH (ref 24–336)

## 2019-04-02 LAB — VITAMIN B12: Vitamin B-12: 573 pg/mL (ref 180–914)

## 2019-04-02 LAB — PHOSPHORUS: Phosphorus: 2.6 mg/dL (ref 2.5–4.6)

## 2019-04-02 LAB — MAGNESIUM: Magnesium: 1.6 mg/dL — ABNORMAL LOW (ref 1.7–2.4)

## 2019-04-02 MED ORDER — METOPROLOL TARTRATE 25 MG PO TABS
12.5000 mg | ORAL_TABLET | Freq: Two times a day (BID) | ORAL | Status: DC
  Administered 2019-04-02 – 2019-04-05 (×6): 12.5 mg
  Filled 2019-04-02 (×7): qty 1

## 2019-04-02 MED ORDER — METOPROLOL TARTRATE 25 MG PO TABS: 12.5000 mg | ORAL_TABLET | Freq: Two times a day (BID) | ORAL | Status: DC

## 2019-04-02 MED ORDER — SODIUM CHLORIDE 0.9% FLUSH
10.0000 mL | INTRAVENOUS | Status: DC | PRN
  Administered 2019-04-02 – 2019-04-03 (×2): 10 mL

## 2019-04-02 MED ORDER — PRO-STAT SUGAR FREE PO LIQD
30.0000 mL | Freq: Three times a day (TID) | ORAL | Status: DC
  Administered 2019-04-02 – 2019-04-05 (×7): 30 mL
  Filled 2019-04-02 (×8): qty 30

## 2019-04-02 MED ORDER — FREE WATER
100.0000 mL | Freq: Every day | Status: DC
  Administered 2019-04-02 – 2019-04-05 (×9): 100 mL

## 2019-04-02 MED ORDER — OSMOLITE 1.5 CAL PO LIQD
237.0000 mL | Freq: Every day | ORAL | Status: DC
  Administered 2019-04-02 – 2019-04-05 (×10): 237 mL
  Filled 2019-04-02 (×18): qty 237

## 2019-04-02 MED ORDER — MAGNESIUM SULFATE 2 GM/50ML IV SOLN
2.0000 g | Freq: Once | INTRAVENOUS | Status: AC
  Administered 2019-04-02: 2 g via INTRAVENOUS
  Filled 2019-04-02: qty 50

## 2019-04-02 MED ORDER — CHLORHEXIDINE GLUCONATE CLOTH 2 % EX PADS
6.0000 | MEDICATED_PAD | Freq: Every day | CUTANEOUS | Status: DC
  Administered 2019-04-02 – 2019-04-05 (×4): 6 via TOPICAL

## 2019-04-02 NOTE — Progress Notes (Signed)
  Writer asked provider to clarify Troponin orders. Provider stated infuse 1 unit of blood. Two hours later draw first troponin. And then give second unit,  drawing second troponin two hours after completion. Will continue to monitor

## 2019-04-02 NOTE — Progress Notes (Signed)
PT Cancellation Note  Patient Details Name: [name] MRN: [id number] DOB: 1960-11-06   Cancelled Treatment:    Reason Eval/Treat Not Completed: Medical issues which prohibited therapy;Patient declined, no reason specified. Pt with resting heart rate 117-120bpm while supine in bed, reports dizziness and back pain. Pt declines PT evaluation, will continue to follow.   Talbot Grumbling PT, DPT 04/02/19, 9:14 AM [phone]

## 2019-04-02 NOTE — Progress Notes (Signed)
Nutrition  Patient currently in hospital and will cancel nutrition appointment for today.    Chart reviewed.   Called wife to confirm that she had received shipment of tube feeding supplies from [place].  Wife confirms that she has enteral formula and supplies.  Assured wife that patient is in good hands with inpatient RD team.   Next visit:  Wednesday, April 7th with Ernestene Kiel.  Discussed this appointment with wife and provided contact number for [facility].  Wife also has this RD's contact  Semira Stoltzfus B. [name], [place], [place] Registered Dietitian [phone] (pager)

## 2019-04-02 NOTE — Progress Notes (Signed)
Patient restarted on bolus feedings. Pt tolerated well, denies nausea vomiting and or discomfort. Pt provided education on the importance of remaining in upright position at least 30 degrees while receiving feeding and after feeding for 30 minutes. Pt appears to be receptive to education. Will continue to encourage and monitor.

## 2019-04-02 NOTE — Evaluation (Addendum)
Occupational Therapy Evaluation Patient Details Name: [name] MRN: [id number] DOB: [birth year] Today's Date: 04/02/2019    History of Present Illness Pt is a 59 y.o. male who presented to [facility] on 02/18/19 for elective multiple dental extractions prior to initiating chemo and radiation therapy to newly diagnosed supraglottic posterior laryngeal squamous cell carcinoma.  During induction, airway was difficult and trach ultimately placed for airway protection. S/p G tube placement 2/22. PMH includes anxiety, carotid artery stenosis, chronic low back pain, CVA (2020).   Clinical Impression   PTA, pt was living at home with his wife, pt reports he was independent with ADL/IADL and functional mobility without AD.  Pt currently limited to bed level this date due to reports of increased nausea with rolling and pt request to keep session bed level. BP 112/82 Pt soiled upon arrival, pt aware, reports he had BM last night, alerted staff but no-one arrived to assist him, pt fell asleep and did not yet tell day staff. Communicated this to RN. Pt required minguard for rolling R<>L and totalA for posterior care. Anticipate pt will continue to progress to prior level of functioning with nausea under control. Currently recommend HHOT, but will continue to assess optimal discharge plan for pt. Due to decline in current level of function, pt would benefit from acute OT to address established goals to facilitate safe D/C to venue listed below. At this time, recommend HHOT follow-up. Will continue to follow acutely.     Follow Up Recommendations  Home health OT;Supervision - Intermittent    Equipment Recommendations  Other (comment)(will continue to assess)    Recommendations for Other Services       Precautions / Restrictions Precautions Precautions: Fall Restrictions Weight Bearing Restrictions: No      Mobility Bed Mobility Overal bed mobility: Needs Assistance Bed Mobility: Rolling Rolling:  Supervision         General bed mobility comments: pt limited to rolling in bed;required minA to reposition pt higher in bed;pt reports when he got to EOB earlier he had assist from one other person, pt declined further mobility at this time  Transfers                 General transfer comment: pt declined    Balance                                           ADL either performed or assessed with clinical judgement   ADL Overall ADL's : Needs assistance/impaired Eating/Feeding: NPO   Grooming: Set up;Sitting;Bed level Grooming Details (indicate cue type and reason): pt washed face Upper Body Bathing: Set up;Sitting   Lower Body Bathing: Minimal assistance;Sitting/lateral leans   Upper Body Dressing : Set up;Bed level;Sitting   Lower Body Dressing: Minimal assistance;Sitting/lateral leans   Toilet Transfer: Minimal assistance Toilet Transfer Details (indicate cue type and reason): rolling R<>L, pt soiled  Toileting- Clothing Manipulation and Hygiene: Maximal assistance         General ADL Comments: limited session to bed level due to pt report increase nausea and increased perspiration with rolling, pt declined progression to EOB;pt soiled upon arrival, pt reports this occurred last night, noone cleaned him up when he alerted staff and then pt fell asleep, did not tell day staff     Vision Patient Visual Report: No change from baseline       Perception  Praxis      Pertinent Vitals/Pain Pain Assessment: No/denies pain(pt reports he is nauseous with mvmnt)     Hand Dominance Right   Extremity/Trunk Assessment Upper Extremity Assessment Upper Extremity Assessment: Generalized weakness   Lower Extremity Assessment Lower Extremity Assessment: Defer to PT evaluation   Cervical / Trunk Assessment Cervical / Trunk Assessment: Normal   Communication Communication Communication: Passy-Muir valve   Cognition Arousal/Alertness:  Awake/alert Behavior During Therapy: WFL for tasks assessed/performed Overall Cognitive Status: Within Functional Limits for tasks assessed                                 General Comments: for basic tasks   General Comments  vss    Exercises Exercises: Other exercises Other Exercises Other Exercises: bridges in bed x5   Shoulder Instructions      Home Living Family/patient expects to be discharged to:: Private residence Living Arrangements: Spouse/significant other Available Help at Discharge: Family;Available 24 hours/day Type of Home: Other(Comment)(double wide trailer) Home Access: Stairs to enter Entrance Stairs-Number of Steps: 3 Entrance Stairs-Rails: Right;Left Home Layout: One level     Bathroom Shower/Tub: Teacher, early years/pre: Standard     Home Equipment: Grab bars - tub/shower;Cane - single point          Prior Functioning/Environment Level of Independence: Independent        Comments: pt drove himself to appointment        OT Problem List: Decreased activity tolerance;Impaired balance (sitting and/or standing);Decreased strength;Decreased knowledge of precautions      OT Treatment/Interventions: Self-care/ADL training;Therapeutic exercise;Energy conservation;DME and/or AE instruction;Therapeutic activities;Patient/family education;Balance training    OT Goals(Current goals can be found in the care plan section) Acute Rehab OT Goals Patient Stated Goal: to not be nauseous OT Goal Formulation: With patient Time For Goal Achievement: 04/16/19 Potential to Achieve Goals: Good ADL Goals Pt Will Perform Grooming: with modified independence;standing Pt Will Perform Lower Body Dressing: with modified independence;sit to/from stand Pt Will Transfer to Toilet: with modified independence;ambulating Pt Will Perform Tub/Shower Transfer: Tub transfer;with supervision Additional ADL Goal #1: Pt will demonstrate independence with 3  energy conservation strategies during ADL/IADL.  OT Frequency: Min 2X/week   Barriers to D/C: Decreased caregiver support  3 steps to get into house       Co-evaluation              AM-PAC OT "6 Clicks" Daily Activity     Outcome Measure Help from another person eating meals?: Total(NPO) Help from another person taking care of personal grooming?: A Little Help from another person toileting, which includes using toliet, bedpan, or urinal?: A Lot Help from another person bathing (including washing, rinsing, drying)?: A Little Help from another person to put on and taking off regular upper body clothing?: A Little Help from another person to put on and taking off regular lower body clothing?: A Little 6 Click Score: 15   End of Session Nurse Communication: Mobility status;Other (comment)(pt nauseous, increased perspiration)  Activity Tolerance: Patient tolerated treatment well;Other (comment)(pt limited by increased nausea with movement) Patient left: in bed;with call bell/phone within reach;with bed alarm set;with nursing/sitter in room  OT Visit Diagnosis: Unsteadiness on feet (R26.81);Other abnormalities of gait and mobility (R26.89);Muscle weakness (generalized) (M62.81)                Time: 4656-8127 OT Time Calculation (min): 28 min Charges:  OT General  Charges $OT Visit: 1 Visit OT Evaluation $OT Eval Moderate Complexity: 1 Mod OT Treatments $Self Care/Home Management : 8-22 mins  Helene Kelp OTR/L Acute Rehabilitation Services Office: [phone]   Wyn Forster 04/02/2019, 11:49 AM

## 2019-04-02 NOTE — Progress Notes (Signed)
Pt seen, trach secure and in place.  Pt declined humidity/atc setup.  Pt has a strong productive cough and refused suctioning at this time.

## 2019-04-02 NOTE — Consult Note (Addendum)
Referring Provider:  Triad [name]         Primary Care Physician:  Jorge School, MD Primary Gastroenterologist:       [name]       We were asked to see this patient for:     GI bleed             ASSESSMENT /  PLAN    59 year old male with history of supra glottis neoplasm, status post tracheostomy, status post PEG placement, CAD status post PCI 2014, CVA, chronic low back pain, hypertension, hyperlipidemia, anxiety, depression.  # Supraglottis malignancy --Receiving chemotherapy / radiation --Has gastrostomy tube  # Syncope --Dehydration?  --Anemia contributing?  # Normocytic anemia / ? "dark", heme positive stools ( patient gives vague history about color of stools) in setting of NSAIDS / Plavix --Baseline hemoglobin 14 but steadily declining over last several weeks (related to chemotherapy?) and now with acute drop from 8.3 to 6.4 over last 5 days. --Patient is limited historian.  Initially said stools have been dark for the last 2 weeks on antibiotics but then says they were not any darker than usual.  --After only one unit of blood his hgb increased by 2 grams.  --He will probably need EGD at some point. His troponin is elevated at present.   # Thrombocytopenia, 129.  --on chemotherapy.   # Elevated Troponin --? demand  # Hx of CVA / Hx of CAD /PCI. On Plavix at home     [name]   I have taken an interval history, reviewed the chart and examined the patient. I agree with the Advanced Practitioner's note, impression and recommendations.   He had a large brown stool (seen in the bed) He declines an EGD at this time and I am not sure its necessary though could be reasonable. Also doubt infection so you can decide re: continuing w/u - stool looks soft but formed  It is quite likely he is leaking blood from tumor - had hemoptysis 2 weeks ago   Would cover with a daily PPI and we will f/u but holding off on an EGD unless becomes more clear that  he needs it  May start diet/tube feeds when you want  Note he has NOT been taking Plavix he says    Jorge Mayer, MD, [facility] 04/02/2019 10:30 AM   HPI:    Chief Complaint: none at present.   [name] is a 59 y.o. male with PMH as above.  He presented to the ED yesterday from oncologist office.  Patient had been receiving radiation for malignant neoplasm of the supraglottis when he felt lightheaded and passed out.  [name] says his stools have been loose and dark the last 2 weeks on antibiotics.  Later he says that stools the consistency is definitely normal for him.  He denies abdominal pain.  No nausea or vomiting except for one episode last night which occurred after receiving something through his gastrostomy tube while in the lying position.  Emesis was apparently clear liquid, nothing bloody or dark.  [name] takes a daily aspirin, he also admits to the naproxen on a daily basis.Marland Kitchen He has no chronic GI problems.  He has never had a colonoscopy.  Denies history of PUD.  He takes Pepcid at home.  Patient says he is not taking p.o. these days, using gastrostomy tube for nutrition.   Results for [name], [name] (MRN [id number]) as of 04/02/2019 08:48  Ref. Range 04/02/2019  04:37  Iron Latest Ref Range: 45 - 182 ug/dL 36 (L)  UIBC Latest Units: ug/dL 252  TIBC Latest Ref Range: 250 - 450 ug/dL 288  Saturation Ratios Latest Ref Range: 17.9 - 39.5 % 13 (L)  Ferritin Latest Ref Range: 24 - 336 ng/mL 626 (H)    Past Medical History:  Diagnosis Date  . Anxiety   . Arthritis    back  . Bradycardia    a. During 11/2012 adm: lopressor decreased.  . Cancer of larynx ([place])   . Carotid artery stenosis   . 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 Coronary artery disease    a. Diag cath 10/2012 for CP/abnormal nuc -> planned PCI s/p LAD atherectomy/DES placement 11/05/12.  Marland Kitchen GERD  (gastroesophageal reflux disease)   . History of hiatal hernia   . Hypercholesteremia   . Hypertension   . Stroke ([place]) 07/31/2018   bilateral vertebrobasilar occlusion; "left side weaker thanit was before."    Past Surgical History:  Procedure Laterality Date  . CARDIAC CATHETERIZATION  10/29/2012  . CORONARY ANGIOPLASTY WITH STENT PLACEMENT  11/05/2012  . DIRECT LARYNGOSCOPY N/A 01/02/2019   Procedure: MICRO DIRECT LARYNGOSCOPY BIOPSY OF LARYNGEAL MASS;  Surgeon: [name], MD;  Location: [facility];  Service: ENT;  Laterality: N/A;  . HEMORRHOID SURGERY  ~ 2010  . INSERTION OF MESH N/A 06/02/2015   Procedure: INSERTION OF MESH;  Surgeon: [name], MD;  Location: [facility];  Service: General;  Laterality: N/A;  . IR ANGIO INTRA EXTRACRAN SEL COM CAROTID INNOMINATE BILAT MOD SED  08/02/2018  . IR ANGIO VERTEBRAL SEL VERTEBRAL BILAT MOD SED  08/02/2018  . LAPAROSCOPIC INSERTION GASTROSTOMY TUBE N/A 02/24/2019   Procedure: LAPAROSCOPIC INSERTION GASTROSTOMY TUBE;  Surgeon: [name], MD;  Location: [facility];  Service: General;  Laterality: N/A;  . LEFT HEART CATHETERIZATION WITH CORONARY ANGIOGRAM N/A 10/30/2012   Procedure: LEFT HEART CATHETERIZATION WITH CORONARY ANGIOGRAM;  Surgeon: [name], MD;  Location: [facility];  Service: Cardiovascular;  Laterality: N/A;  . MULTIPLE EXTRACTIONS WITH ALVEOLOPLASTY N/A 02/18/2019   Procedure: Extraction of tooth #'s 5-7, 10,11,14,20-29, 31 and 32 with alveoloplasty and maxiillary left buccal exostoses reductions;  Surgeon: [name], [name];  Location: [facility];  Service: Oral Surgery;  Laterality: N/A;  . PERCUTANEOUS CORONARY ROTOBLATOR INTERVENTION (PCI-R) N/A 11/05/2012   Procedure: PERCUTANEOUS CORONARY ROTOBLATOR INTERVENTION (PCI-R);  Surgeon: [name], MD;  Location[facility];  Service: Cardiovascular;  Laterality: N/A;  . TRACHEOSTOMY TUBE PLACEMENT N/A 02/18/2019   Procedure: Tracheostomy;  Surgeon: [name], MD;  Location: [place];  Service: ENT;  Laterality: N/A;  . UMBILICAL HERNIA REPAIR N/A 06/02/2015   Procedure: UMBILICAL HERNIORRHAPHY WITH MESH;  Surgeon: [name], MD;  Location: [facility];  Service: General;  Laterality: N/A;    Prior to Admission medications   Medication Sig Start Date End Date Taking? Authorizing Provider  amLODipine (NORVASC) 5 MG tablet Take 1 tablet (5 mg total) by mouth daily. 12/19/18  Yes Frann Rider, NP  aspirin EC 81 MG tablet Take 81 mg by mouth daily.   Yes [provider]  atorvastatin (LIPITOR) 80 MG tablet Take 1 tablet (80 mg total) by mouth every evening. 10/23/17  Yes Herminio Commons, MD  famotidine (PEPCID) 20 MG tablet Take 20 mg by mouth at bedtime.   Yes [provider]  HYDROcodone-acetaminophen (NORCO) 10-325 MG tablet Take 1 tablet by mouth every 4 (four) hours as needed. Patient taking differently: Take 1 tablet by mouth every 4 (four) hours as needed for severe pain.  06/02/15  Yes [name], MD  lidocaine (XYLOCAINE) 2 % solution Patient: Mix 1part 2% viscous lidocaine, 1part H20. Swish & swallow 1m of diluted mixture, 386m before meals and at bedtime, up to QID Patient taking differently: Use as directed 15 mLs in the mouth or throat See admin instructions. 30 minutes before meals and at bedtime, up to 4 times daily. 03/10/19  Yes SqEppie GibsonMD  metoprolol tartrate (LOPRESSOR) 25 MG tablet TAKE 1/2 TABLET BY MOUTH TWICE DAILY Patient taking differently: Take 12.5 mg by mouth 2 (two) times daily.  01/25/15  Yes KoHerminio CommonsMD  naproxen (NAPROSYN) 250 MG tablet Take 250 mg by mouth 2 (two) times daily. 02/25/19  Yes [provider]  nitroGLYCERIN (NITROSTAT) 0.4 MG SL tablet Place 1 tablet (0.4 mg total) under the tongue every 5 (five) minutes as needed for chest pain (CP or SOB). 07/27/14  Yes KoHerminio CommonsMD  Nutritional Supplements (FEEDING SUPPLEMENT, OSMOLITE 1.5 CAL,) LIQD Give 2 cartons of osmolite 1.5 at  9:30am, 1:30, 5:30 and 1 carton at 9:30pm.  Flush with 6062mf water before and 120m52mter each feeding. Meets 100% of patient needs. Send bolus supplies. Patient taking differently: Place 1,000 mLs into feeding tube in the morning, at noon, in the evening, and at bedtime. Give 2 cartons of osmolite 1.5 at 9:30am, 1:30, 5:30 and 1 carton at 9:30pm.  Flush with 60ml81mwater before and 120ml 10mr each feeding. Meets 100% of patient needs. Send bolus supplies. 03/19/19  Yes SquireEppie GibsonNutritional Supplements (PROSOURCE TF) LIQD Give 30ml 273mes per day via feeding tube.  Mix with 60ml of50mer.  Flush tube with 60ml of 82mr, then place prosource and water mixture in tube and flush with 60ml of w69m after. Patient taking differently: Place 30 mLs into feeding tube in the morning and at bedtime. Mix with 60ml of wa68m  Flush tube with 60ml of wat53mthen place prosource and water mixture in tube and flush with 60ml of wate88mter. 03/19/19  Yes Squire, SarahEppie Gibsonole (PRILOSEC) 20 MG capsule Take 20 mg by mouth daily.  07/22/18  Yes [provider]  prochlorperazine (COMPAZINE) 10 MG tablet Take 1 tablet (10 mg total) by mouth every 6 (six) hours as needed (Nausea or vomiting). 03/06/19  Yes Katragadda, SDerek Jackl (ULTRAM) 50 MG tablet Take 50-100 mg by mouth 4 (four) times daily as needed (pain.).  01/29/19  Yes [provider]  Catheters (ARGYLE SUCTION CATHETER 14FR) MISC 1 Device by Does not apply route daily. 02/25/19   Segal, Jared Harold HedgeIN IV Inject into the vein once a week. Weekly in conjunction with radiation therapy beginning 03/06/2019 03/04/19   [provider]  clopidogrel (PLAVIX) 75 MG tablet Take 1 tablet (75 mg total) by mouth daily. Patient not taking: Reported on 02/17/2019 12/24/18   McCue, JessicFrann Riderevices MISC 10 mL saline flushes - one box 03/03/19   Katragadda, SDerek Jackevices MISC Osmolite 1.5 x 7 cartons  daily 2 cartons at 8 am, 2 cartons at noon, 2 cartons at 4pm, 1 carton at 8pm Send bolus tube feeding supplies 03/21/19   Lockamy, Randi L, NP-C  Misc. Devices MISC  ProSource or equivalent 30 ml twice daily via feeding tube Patient not taking: Reported on 04/01/2019 03/21/19   Francene Finders L, NP-C  sodium chloride 0.9 % infusion Use as directed to flush PICC line 03/31/19   [name], MD  sodium chloride irrigation 0.9 % irrigation Irrigate with 200 mLs as directed continuous. 02/25/19   Harold Hedge, MD  Tracheostomy Care KIT 1 kit by Does not apply route daily. 02/25/19   Harold Hedge, MD    Current Facility-Administered Medications  Medication Dose Route Frequency Provider Last Rate Last Admin  . 0.9 %  sodium chloride infusion   Intravenous Continuous Kayleen Memos, DO 125 mL/hr at 04/01/19 1447 New Bag at 04/01/19 1447  . 0.9 %  sodium chloride infusion  10 mL/hr Intravenous Once Lacretia Leigh, MD      . acetaminophen (TYLENOL) 160 MG/5ML solution 650 mg  650 mg Per Tube Q6H PRN Kayleen Memos, DO      . Chlorhexidine Gluconate Cloth 2 % PADS 6 each  6 each Topical Daily Irene Pap N, DO      . DULoxetine (CYMBALTA) DR capsule 30 mg  30 mg Oral Daily Irene Pap N, DO   30 mg at 04/01/19 2023  . magnesium sulfate IVPB 2 g 50 mL  2 g Intravenous Once Irene Pap N, DO      . metoprolol tartrate (LOPRESSOR) tablet 12.5 mg  12.5 mg Per Tube BID Irene Pap N, DO      . ondansetron (ZOFRAN) injection 4 mg  4 mg Intravenous Q6H PRN Irene Pap N, DO   4 mg at 04/02/19 0121  . pantoprazole (PROTONIX) injection 40 mg  40 mg Intravenous Q12H Hall, Archie Patten N, DO   40 mg at 04/02/19 0121  . potassium chloride 20 MEQ/15ML (10%) solution 20 mEq  20 mEq Per Tube Daily Irene Pap N, DO   Stopped at 04/02/19 1000  . sodium chloride flush (NS) 0.9 % injection 10-40 mL  10-40 mL Intracatheter PRN Irene Pap N, DO   10 mL at 04/02/19 0450  . Tracheostomy Care KIT 1 kit  1 kit Does not  apply Daily Irene Pap N, DO      . traMADol Veatrice Bourbon) tablet 50-100 mg  50-100 mg Per Tube QID PRN Irene Pap N, DO   100 mg at 04/02/19 0121    Allergies as of 04/01/2019 - Review Complete 04/01/2019  Allergen Reaction Noted  . Prednisone Other (See Comments) 07/20/2016    Family History  Problem Relation Age of Onset  . Hypertension Mother   . Heart disease Father   . Hypertension Father   . Heart disease Sister   . Hypertension Maternal Grandmother   . Hypertension Maternal Grandfather   . Hypertension Paternal Grandmother   . Hypertension Paternal Grandfather     Social History   Socioeconomic History  . Marital status: Married    Spouse name: Not on file  . Number of children: 4  . Years of education: Not on file  . Highest education level: Not on file  Occupational History  . Occupation: Disabled  Tobacco Use  . Smoking status: Former Smoker    Packs/day: 3.00    Years: 32.00    Pack years: 96.00    Types: Cigarettes    Start date: 08/29/1977    Quit date: 11/29/2006    Years since quitting: 12.3  . Smokeless tobacco: Former Systems developer    Types: Chew  . Tobacco  comment: 11/05/2012 "chewed tobacco when I play ball; aien't chewed since age 34"  Substance and Sexual Activity  . Alcohol use: Yes    Alcohol/week: 12.0 standard drinks    Types: 12 Cans of beer per week    Comment: Currently 3-4 beers per day. 02/13/19, was a heavy drinker in the past, - states he could drink a case a day   . Drug use: Yes    Types: Marijuana  . Sexual activity: Not Currently  Other Topics Concern  . Not on file  Social History Narrative   Patient is disabled.  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   Patient quit smoking 10 to 12 years ago.  Patient previously 3 pack/day use.   Patient currently drinking 3-4 beers per day from 12 pack a day.   Patient denies use of chew or other illicit drugs.   Social Determinants of Health   Financial Resource  Strain: Low Risk   . Difficulty of Paying Living Expenses: Not hard at all  Food Insecurity: No Food Insecurity  . Worried About Charity fundraiser in the Last Year: Never true  . Ran Out of Food in the Last Year: Never true  Transportation Needs: No Transportation Needs  . Lack of Transportation (Medical): No  . Lack of Transportation (Non-Medical): No  Physical Activity: Insufficiently Active  . Days of Exercise per Week: 2 days  . Minutes of Exercise per Session: 30 min  Stress: Stress Concern Present  . Feeling of Stress : To some extent  Social Connections: Moderately Isolated  . Frequency of Communication with Friends and Family: Once a week  . Frequency of Social Gatherings with Friends and Family: Once a week  . Attends Religious Services: Never  . Active Member of Clubs or Organizations: No  . Attends Archivist Meetings: Never  . Marital Status: Married  Human resources officer Violence: Not At Risk  . Fear of Current or Ex-Partner: No  . Emotionally Abused: No  . Physically Abused: No  . Sexually Abused: No    Review of Systems: All systems reviewed and negative except where noted in HPI.  Physical Exam: Vital signs in last 24 hours: Temp:  [97.8 F (36.6 C)-99.2 F (37.3 C)] 97.8 F (36.6 C) (03/31 0604) Pulse Rate:  [92-123] 119 (03/31 0840) Resp:  [8-28] 18 (03/31 0840) BP: (103-184)/(78-111) 103/84 (03/31 0822) SpO2:  [93 %-100 %] 94 % (03/31 0840) Weight:  [76 kg] 76 kg (03/30 1423)   General:   Alert, well-developed,  male in NAD Psych:  cooperative. Normal mood and affect. Eyes:  Pupils equal, sclera clear, no icterus.   Conjunctiva pink. Ears:  Normal auditory acuity. Nose:  No deformity, discharge,  or lesions. Neck:  Supple; no masses.  Tracheostomy tube present Lungs:  Clear throughout to auscultation.   No wheezes, crackles, or rhonchi.  Heart:  Regular rate and rhythm; no murmurs, no lower extremity edema Abdomen:  Soft, non-distended,  nontender, BS active, no palp mass   Rectal:  Deferred  Msk:  Symmetrical without gross deformities. . Neurologic:  Alert and  oriented x4;  grossly normal neurologically. Skin:  Intact without significant lesions or rashes.   Intake/Output from previous day: 03/30 0701 - 03/31 0700 In: 896 [I.V.:520; Blood:376] Out: -  Intake/Output this shift: No intake/output data recorded.  Lab Results: Recent Labs    04/01/19 1440 04/02/19 0437  WBC 3.2* 3.3*  HGB 6.4* 8.4*  HCT 20.3*  25.8*  PLT 144* 129*   BMET Recent Labs    04/01/19 1440 04/02/19 0437  NA 136 133*  K 2.8* 5.1  CL 108 104  CO2 19* 22  GLUCOSE 126* 118*  BUN 14 16  CREATININE 0.72 0.90  CALCIUM 6.5* 8.2*   LFT Recent Labs    04/01/19 1440  PROT 4.9*  ALBUMIN 2.5*  AST 23  ALT 24  ALKPHOS 41  BILITOT 0.4   PT/INR No results for input(s): LABPROT, INR in the last 72 hours. Hepatitis Panel No results for input(s): HEPBSAG, HCVAB, HEPAIGM, HEPBIGM in the last 72 hours.   . CBC Latest Ref Rng & Units 04/02/2019 04/01/2019 03/27/2019  WBC 4.0 - 10.5 K/uL 3.3(L) 3.2(L) 3.4(L)  Hemoglobin 13.0 - 17.0 g/dL 8.4(L) 6.4(LL) 8.3(L)  Hematocrit 39.0 - 52.0 % 25.8(L) 20.3(L) 25.8(L)  Platelets 150 - 400 K/uL 129(L) 144(L) 136(L)    . CMP Latest Ref Rng & Units 04/02/2019 04/01/2019 03/27/2019  Glucose 70 - 99 mg/dL 118(H) 126(H) 100(H)  BUN 6 - 20 mg/dL '16 14 12  '$ Creatinine 0.61 - 1.24 mg/dL 0.90 0.72 0.87  Sodium 135 - 145 mmol/L 133(L) 136 132(L)  Potassium 3.5 - 5.1 mmol/L 5.1 2.8(L) 4.5  Chloride 98 - 111 mmol/L 104 108 97(L)  CO2 22 - 32 mmol/L 22 19(L) 26  Calcium 8.9 - 10.3 mg/dL 8.2(L) 6.5(L) 8.9  Total Protein 6.5 - 8.1 g/dL - 4.9(L) 6.5  Total Bilirubin 0.3 - 1.2 mg/dL - 0.4 0.6  Alkaline Phos 38 - 126 U/L - 41 51  AST 15 - 41 U/L - 23 16  ALT 0 - 44 U/L - 24 26   Studies/Results: DG Chest Port 1 View  Result Date: 04/01/2019 CLINICAL DATA:  Syncope EXAM: PORTABLE CHEST 1 VIEW COMPARISON:   03/17/2019 FINDINGS: Two frontal views of the chest demonstrates stable tracheostomy tube and right-sided PICC. Cardiac silhouette is unremarkable. No airspace disease, effusion, or pneumothorax. No acute bony abnormalities. IMPRESSION: 1. No acute intrathoracic process. Electronically Signed   By: Randa Ngo M.D.   On: 04/01/2019 15:04    Active Problems:   GI bleed    Tye Savoy, NP-C @  04/02/2019, 8:49 AM

## 2019-04-02 NOTE — Progress Notes (Signed)
Initial Nutrition Assessment  DOCUMENTATION CODES:   Non-severe (moderate) malnutrition in context of chronic illness  INTERVENTION:  - will order 1 carton (237 ml) Osmolite 1.5 x5/day (1930, 1230, 1530, 1830, 2130) with 30 ml prostat TID and 100 ml free water/bolus (50 ml before and 50 ml after) - this TF regimen will provide 2075 kcal (91% estimated kcal need), 119 grams protein, and 1405 ml free water. - diet advancement as medically feasible.    NUTRITION DIAGNOSIS:   Moderate Malnutrition related to chronic illness, cancer and cancer related treatments as evidenced by mild fat depletion, mild muscle depletion, moderate muscle depletion, percent weight loss.  GOAL:   Patient will meet greater than or equal to 90% of their needs  MONITOR:   Diet advancement, TF tolerance, Labs, Weight trends  REASON FOR ASSESSMENT:   Consult Enteral/tube feeding initiation and management  ASSESSMENT:   59 y.o. male with medical history significant for malignant neoplasm of supraglottis s/p trach, dysphagia s/p PEG tube placement, CAD s/p PCI with stenting in 2014, CVA, chronic lower back pain, chronic anxiety/depression, essential HTN, and hyperlipidemia. He presented to the ED from his Oncologist's office after passing out. He was receiving radiation therapy, felt lightheaded, and passed out.  This has happened in the past, received IV fluid with improvement. He reported more frequent dark watery stools. Denies nausea or abdominal pain. No prior colonoscopy.  On presentation to the ED hypotensive and tachycardic.  Work-up revealed drop in hemoglobin down to 6.4 and positive FOBT.  Patient has been NPO since admission. He is followed by a RD at the [facility] and was last seen on 3/31. At that time, patient was administering 1 carton (237 ml) Osmolite 1.5 via PEG QID (0930, 1230, 1530, 1830) in addition to drinking 1-2 bottles of Boost/day and eating items such as eggs, peanut butter sandwich,  ice cream, and drinking sprite.   Patient confirms these items and denies any changes in the past 1 week. He requested something to drink. RD stepped out to contact MD about this. MD would like SLP to evaluate patient prior to allowing any PO items. When RD returned to patient's room, his wife was at bedside. We were able to continue discuss information about home nutrition regimen and RD informed patient and his wife of plan concerning drinking.   Per chart review, weight on 3/30 was 167 lb and weight on 3/2 was 179 lb. This indicates 12 lb weight loss (6.7% body weight) in the past month; significant for time frame.    Labs reviewed; Na: 133 mmol/l, Ca: 8.2 mg/dl, Mg: 1.6 mg/dl. Medications reviewed; 2 g IV Mg sulfate x1 run 3/31, 40 mg IV protonix BID, 10 mEq IV KCl x4 runs 3/30, 40 mEq Klor-Con x1 dose 3/30. IVF; NS @ 50 ml/hr.     NUTRITION - FOCUSED PHYSICAL EXAM:    Most Recent Value  Orbital Region  No depletion  Upper Arm Region  Mild depletion  Thoracic and Lumbar Region  Unable to assess  Buccal Region  Mild depletion  Temple Region  Mild depletion  Clavicle Bone Region  Mild depletion  Clavicle and Acromion Bone Region  Moderate depletion  Scapular Bone Region  Unable to assess  Dorsal Hand  No depletion  Patellar Region  Mild depletion  Anterior Thigh Region  Unable to assess  Posterior Calf Region  Mild depletion  Edema (RD Assessment)  None  Hair  Reviewed  Eyes  Reviewed  Mouth  Reviewed  Skin  Reviewed  Nails  Reviewed       Diet Order:   Diet Order            Diet NPO time specified Except for: Other (See Comments)  Diet effective now              EDUCATION NEEDS:   No education needs have been identified at this time  Skin:  Skin Assessment: Reviewed RN Assessment  Last BM:  PTA/unknown  Height:   Ht Readings from Last 1 Encounters:  04/02/19 6' (1.829 m)    Weight:   Wt Readings from Last 1 Encounters:  04/01/19 76 kg      Estimated Nutritional Needs:  Kcal:  2280-2510 kcal Protein:  115-130 grams Fluid:  >/= 2.3 L/day     Jarome Matin, MS, RD, LDN, CNSC Inpatient Clinical Dietitian RD pager # available in [facility]  After hours/weekend pager # available in [facility]

## 2019-04-02 NOTE — Progress Notes (Addendum)
PROGRESS NOTE  [name] YTK:[id number] DOB: 02/11/1960 DOA: 04/01/2019 PCP: [name], MD  HPI/Recap of past 24 hours: [name] is a 59 y.o. male with medical history significant for malignant neoplasm of supraglottis post tracheostomy, dysphagia status post PEG tube placement, coronary artery disease status post PCI with stenting in 2014, CVA, chronic lower back pain, chronic anxiety/depression, essential hypertension, hyperlipidemia who presented to [facility] ED sent from his oncologist office after passing out.  He was receiving radiation therapy, felt lightheaded and passed out.  This has happened in the past, received IV fluid with improvement.  States he was in his usual state of health prior to this.  He drove himself to radiation today.  Reports more frequent dark watery stools.  Denies nausea or abdominal pain.  Denies use of NSAIDs, states he used Tylenol for his arthritis and lower back pain.  No prior colonoscopy.  On presentation to the ED hypotensive and tachycardic.  Work-up revealed drop in hemoglobin down to 6.4.  Positive FOBT.  GI called for consult.  TRH asked to admit.  ED Course: Started on IV fluids and 2 units PRBCs ordered for transfusion.  04/02/19:  Seen and examined.  Reports some chest discomfort this AM.  Trop peaked at 468 and trended down.  No evidence of acute ischemia on 12 lead EKG.  Will continue to closely monitor.   Cardiology will see in consultation.  Assessment/Plan: Active Problems:   GI bleed  Presumed upper GI bleed Presented with hemoglobin of 6.4 and positive FOBT Denies abdominal pain or nausea. 2 unit PRBC ordered in the ED for transfusion Hg stable this AM 8.4 from 6.4 after 1U PRBC On IV PPI twice daily GI following  Elevated troponin suspect demand ischemia in the setting of severe anemia and hypotension Trop S Peaked at 468 and trending down  No evidence of acute ischemia on 12 lead EKG. Had some chest discomfort this morning,  resolved this afternoon.   Per his bedside RN states he feels better this afternoon. Cardiology consulted and will see in the AM  Resolved Diarrhea in the setting of gastric tube feeding  Dysphagia status post PEG tube placement Dietitian consult to initiate PEG tube feeding  Patient asking for oral intake Speech consult to assess for safety in swallowing  Anemia of chronic disease in the setting of malignancy Iron studies suggestive of iron defficiency Received 1 U PRBC Monitor H&H  Resolved Hypokalemia Presented with potassium of 2.8>> 5.1.  Repleted Magnesium level 2.1 on 03/27/2019  Syncope, suspect in the setting of dehydration Twelve-lead EKG no evidence of acute ischemia Obtain orthostatic vital signs PT OT to assess  Continue gentle IV fluid Fall precautions  Malignant neoplasm of supraglottis Follows with oncology and radiation oncology outpatient Ongoing plans for radiation  Chronic anxiety/depression Continue home medication  Recent right lower lobe pneumonia Diagnosed 2 weeks ago States he completed his oral abx on 04/01/19. No evidence of active infective process O2 sat 96% on room air   DVT prophylaxis: SCDs; chemical DVT prophylaxis contraindicated in the setting of suspected GI bleed  Code Status: Full code as stated by the patient himself  Family Communication: Updated the patient's wife at bedside.  Disposition Plan:  Patient is from home.  Anticipate discharge to home in the next 24 to 48 hours or when cardiology and GI sign off.   Consults called: GI consulted by EDP, cardiology.    Objective: Vitals:   04/02/19 5573 04/02/19 2202  04/02/19 0840 04/02/19 1038  BP: (!) 184/111 103/84  107/84  Pulse: (!) 110 (!) 123 (!) 119 (!) 107  Resp:  '20 18 20  '$ Temp: 97.8 F (36.6 C)   97.6 F (36.4 C)  TempSrc: Oral   Oral  SpO2:  95% 94% 96%  Weight:      Height:        Intake/Output Summary (Last 24 hours) at 04/02/2019  1227 Last data filed at 04/02/2019 0450 Gross per 24 hour  Intake 896 ml  Output --  Net 896 ml   Filed Weights   04/01/19 1423  Weight: 76 kg    Exam:  . General: 58 y.o. year-old male well developed well nourished in no acute distress.  Alert and oriented x3. Post tracheostomy. . Cardiovascular: Regular rate and rhythm with no rubs or gallops.  No thyromegaly or JVD noted.   Marland Kitchen Respiratory: Clear to auscultation with no wheezes or rales. Good inspiratory effort. . Abdomen: Soft nontender nondistended with normal bowel sounds x4 quadrants. . Musculoskeletal: No lower extremity edema bilaterally. Marland Kitchen Psychiatry: Mood is appropriate for condition and setting   Data Reviewed: CBC: Recent Labs  Lab 03/27/19 0925 04/01/19 1440 04/02/19 0437  WBC 3.4* 3.2* 3.3*  NEUTROABS 2.1 2.3  --   HGB 8.3* 6.4* 8.4*  HCT 25.8* 20.3* 25.8*  MCV 96.3 98.5 95.2  PLT 136* 144* 701*   Basic Metabolic Panel: Recent Labs  Lab 03/27/19 0925 04/01/19 1440 04/02/19 0437  NA 132* 136 133*  K 4.5 2.8* 5.1  CL 97* 108 104  CO2 26 19* 22  GLUCOSE 100* 126* 118*  BUN '12 14 16  '$ CREATININE 0.87 0.72 0.90  CALCIUM 8.9 6.5* 8.2*  MG 2.1  --  1.6*  PHOS  --   --  2.6   GFR: Estimated Creatinine Clearance: 97.3 mL/min (by C-G formula based on SCr of 0.9 mg/dL). Liver Function Tests: Recent Labs  Lab 03/27/19 0925 04/01/19 1440  AST 16 23  ALT 26 24  ALKPHOS 51 41  BILITOT 0.6 0.4  PROT 6.5 4.9*  ALBUMIN 3.1* 2.5*   No results for input(s): LIPASE, AMYLASE in the last 168 hours. No results for input(s): AMMONIA in the last 168 hours. Coagulation Profile: No results for input(s): INR, PROTIME in the last 168 hours. Cardiac Enzymes: No results for input(s): CKTOTAL, CKMB, CKMBINDEX, TROPONINI in the last 168 hours. BNP (last 3 results) No results for input(s): PROBNP in the last 8760 hours. HbA1C: No results for input(s): HGBA1C in the last 72 hours. CBG: No results for input(s):  GLUCAP in the last 168 hours. Lipid Profile: No results for input(s): CHOL, HDL, LDLCALC, TRIG, CHOLHDL, LDLDIRECT in the last 72 hours. Thyroid Function Tests: No results for input(s): TSH, T4TOTAL, FREET4, T3FREE, THYROIDAB in the last 72 hours. Anemia Panel: Recent Labs    04/02/19 0437  VITAMINB12 573  FERRITIN 626*  TIBC 288  IRON 36*  RETICCTPCT 0.7   Urine analysis:    Component Value Date/Time   COLORURINE YELLOW 03/17/2019 1600   APPEARANCEUR CLEAR 03/17/2019 1600   LABSPEC 1.021 03/17/2019 1600   PHURINE 6.0 03/17/2019 1600   GLUCOSEU NEGATIVE 03/17/2019 1600   HGBUR NEGATIVE 03/17/2019 1600   BILIRUBINUR NEGATIVE 03/17/2019 1600   KETONESUR NEGATIVE 03/17/2019 1600   PROTEINUR 30 (A) 03/17/2019 1600   NITRITE NEGATIVE 03/17/2019 1600   LEUKOCYTESUR NEGATIVE 03/17/2019 1600   Sepsis Labs: '@LABRCNTIP'$ (procalcitonin:4,lacticidven:4)  ) Recent Results (from the past 240 hour(s))  SARS CORONAVIRUS 2 (TAT 6-24 HRS) Nasopharyngeal Nasopharyngeal Swab     Status: None   Collection Time: 04/01/19  6:13 PM   Specimen: Nasopharyngeal Swab  Result Value Ref Range Status   SARS Coronavirus 2 NEGATIVE NEGATIVE Final    Comment: (NOTE) SARS-CoV-2 target nucleic acids are NOT DETECTED. The SARS-CoV-2 RNA is generally detectable in upper and lower respiratory specimens during the acute phase of infection. Negative results do not preclude SARS-CoV-2 infection, do not rule out co-infections with other pathogens, and should not be used as the sole basis for treatment or other patient management decisions. Negative results must be combined with clinical observations, patient history, and epidemiological information. The expected result is Negative. Fact Sheet for Patients: SugarRoll.be Fact Sheet for Healthcare Providers: https://www.woods-mathews.com/ This test is not yet approved or cleared by the [place] FDA and  has been  authorized for detection and/or diagnosis of SARS-CoV-2 by FDA under an Emergency Use Authorization (EUA). This EUA will remain  in effect (meaning this test can be used) for the duration of the COVID-19 declaration under Section 56 4(b)(1) of the Act, 21 U.S.C. section 360bbb-3(b)(1), unless the authorization is terminated or revoked sooner. Performed at [facility], [place] [address], [place], [place] [address]       Studies: DG Chest Port 1 View  Result Date: 04/01/2019 CLINICAL DATA:  Syncope EXAM: PORTABLE CHEST 1 VIEW COMPARISON:  03/17/2019 FINDINGS: Two frontal views of the chest demonstrates stable tracheostomy tube and right-sided PICC. Cardiac silhouette is unremarkable. No airspace disease, effusion, or pneumothorax. No acute bony abnormalities. IMPRESSION: 1. No acute intrathoracic process. Electronically Signed   By: Randa Ngo M.D.   On: 04/01/2019 15:04    Scheduled Meds: . Chlorhexidine Gluconate Cloth  6 each Topical Daily  . DULoxetine  30 mg Oral Daily  . metoprolol tartrate  12.5 mg Per Tube BID  . pantoprazole (PROTONIX) IV  40 mg Intravenous Q12H  . potassium chloride  20 mEq Per Tube Daily  . Tracheostomy Care  1 kit Does not apply Daily    Continuous Infusions: . sodium chloride 125 mL/hr at 04/01/19 1447  . sodium chloride       LOS: 1 day     Kayleen Memos, MD [facility] Pager [phone]  If 7PM-7AM, please contact night-coverage [email or web address] Password [facility] 04/02/2019, 12:27 PM

## 2019-04-02 NOTE — Progress Notes (Signed)
Made provider aware patient pulse and blood pressure are elevated. Will continue to monitor at this time.

## 2019-04-02 NOTE — Progress Notes (Signed)
Critical troponin 468  provider made aware

## 2019-04-03 ENCOUNTER — Ambulatory Visit (HOSPITAL_COMMUNITY): Payer: 59 | Admitting: Hematology

## 2019-04-03 ENCOUNTER — Ambulatory Visit: Payer: 59

## 2019-04-03 ENCOUNTER — Inpatient Hospital Stay (HOSPITAL_COMMUNITY): Payer: 59

## 2019-04-03 ENCOUNTER — Other Ambulatory Visit (HOSPITAL_COMMUNITY): Payer: 59

## 2019-04-03 ENCOUNTER — Other Ambulatory Visit (HOSPITAL_COMMUNITY): Payer: Self-pay | Admitting: Hematology

## 2019-04-03 ENCOUNTER — Ambulatory Visit (HOSPITAL_COMMUNITY): Payer: 59

## 2019-04-03 DIAGNOSIS — I2510 Atherosclerotic heart disease of native coronary artery without angina pectoris: Secondary | ICD-10-CM

## 2019-04-03 DIAGNOSIS — R710 Precipitous drop in hematocrit: Secondary | ICD-10-CM

## 2019-04-03 DIAGNOSIS — K921 Melena: Secondary | ICD-10-CM

## 2019-04-03 DIAGNOSIS — R55 Syncope and collapse: Secondary | ICD-10-CM

## 2019-04-03 DIAGNOSIS — C321 Malignant neoplasm of supraglottis: Secondary | ICD-10-CM

## 2019-04-03 DIAGNOSIS — R778 Other specified abnormalities of plasma proteins: Secondary | ICD-10-CM

## 2019-04-03 DIAGNOSIS — E440 Moderate protein-calorie malnutrition: Secondary | ICD-10-CM | POA: Insufficient documentation

## 2019-04-03 LAB — BASIC METABOLIC PANEL
Anion gap: 7 (ref 5–15)
BUN: 22 mg/dL — ABNORMAL HIGH (ref 6–20)
CO2: 24 mmol/L (ref 22–32)
Calcium: 8.2 mg/dL — ABNORMAL LOW (ref 8.9–10.3)
Chloride: 101 mmol/L (ref 98–111)
Creatinine, Ser: 0.81 mg/dL (ref 0.61–1.24)
GFR calc Af Amer: >60 mL/min (ref >60)
GFR calc non Af Amer: >60 mL/min (ref >60)
Glucose, Bld: 114 mg/dL — ABNORMAL HIGH (ref 70–99)
Potassium: 4.4 mmol/L (ref 3.5–5.1)
Sodium: 132 mmol/L — ABNORMAL LOW (ref 135–145)

## 2019-04-03 LAB — FOLATE RBC
Folate, Hemolysate: 361 ng/mL
Folate, RBC: 1444 ng/mL (ref >498)
Hematocrit: 25 % — ABNORMAL LOW (ref 37.5–51.0)

## 2019-04-03 LAB — CBC
HCT: 23.6 % — ABNORMAL LOW (ref 39.0–52.0)
Hemoglobin: 7.8 g/dL — ABNORMAL LOW (ref 13.0–17.0)
MCH: 32.4 pg (ref 26.0–34.0)
MCHC: 33.1 g/dL (ref 30.0–36.0)
MCV: 97.9 fL (ref 80.0–100.0)
Platelets: 131 10*3/uL — ABNORMAL LOW (ref 150–400)
RBC: 2.41 MIL/uL — ABNORMAL LOW (ref 4.22–5.81)
RDW: 15.2 % (ref 11.5–15.5)
WBC: 3.9 10*3/uL — ABNORMAL LOW (ref 4.0–10.5)
nRBC: 0 % (ref 0.0–0.2)

## 2019-04-03 LAB — ECHOCARDIOGRAM COMPLETE
Height: 72 in
Weight: 2680.79 oz

## 2019-04-03 MED ORDER — PANTOPRAZOLE SODIUM 40 MG PO PACK
40.0000 mg | PACK | Freq: Two times a day (BID) | ORAL | Status: DC
  Administered 2019-04-03 – 2019-04-05 (×3): 40 mg
  Filled 2019-04-03 (×5): qty 20

## 2019-04-03 NOTE — Progress Notes (Signed)
  Echocardiogram 2D Echocardiogram has been performed.  [name] G [name] 04/03/2019, 2:13 PM

## 2019-04-03 NOTE — Progress Notes (Signed)
Telemetry notified RN of low )2 in the low 80's. Pt refusing to wear trach collar. RN informed pt that we will need him to cap trach so that he can wear nasal cannula. Benitez applied, O2 93%. Will continue to monitor pt.

## 2019-04-03 NOTE — Progress Notes (Signed)

## 2019-04-03 NOTE — Evaluation (Signed)
Clinical/Bedside Swallow Evaluation Patient Details  Name: [name] MRN: [id number] Date of Birth: 1960-11-22  Today's Date: 04/03/2019 Time: SLP Start Time (ACUTE ONLY): 0740 SLP Stop Time (ACUTE ONLY): 0750 SLP Time Calculation (min) (ACUTE ONLY): 10 min  Past Medical History:  Past Medical History:  Diagnosis Date  . Anxiety   . Arthritis    back  . Bradycardia    a. During 11/2012 adm: lopressor decreased.  . Cancer of larynx ([place])   . Carotid artery stenosis   . 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 Coronary artery disease    a. Diag cath 10/2012 for CP/abnormal nuc -> planned PCI s/p LAD atherectomy/DES placement 11/05/12.  Marland Kitchen GERD (gastroesophageal reflux disease)   . History of hiatal hernia   . Hypercholesteremia   . Hypertension   . Stroke ([place]) 07/31/2018   bilateral vertebrobasilar occlusion; "left side weaker thanit was before."   Past Surgical History:  Past Surgical History:  Procedure Laterality Date  . CARDIAC CATHETERIZATION  10/29/2012  . CORONARY ANGIOPLASTY WITH STENT PLACEMENT  11/05/2012  . DIRECT LARYNGOSCOPY N/A 01/02/2019   Procedure: MICRO DIRECT LARYNGOSCOPY BIOPSY OF LARYNGEAL MASS;  Surgeon: [name], MD;  Location: [place] OR;  Service: ENT;  Laterality: N/A;  . HEMORRHOID SURGERY  ~ 2010  . INSERTION OF MESH N/A 06/02/2015   Procedure: INSERTION OF MESH;  Surgeon: [name], MD;  Location: [facility];  Service: General;  Laterality: N/A;  . IR ANGIO INTRA EXTRACRAN SEL COM CAROTID INNOMINATE BILAT MOD SED  08/02/2018  . IR ANGIO VERTEBRAL SEL VERTEBRAL BILAT MOD SED  08/02/2018  . LAPAROSCOPIC INSERTION GASTROSTOMY TUBE N/A 02/24/2019   Procedure: LAPAROSCOPIC INSERTION GASTROSTOMY TUBE;  Surgeon: [name], MD;  Location: [place];  Service: General;  Laterality: N/A;  . LEFT HEART CATHETERIZATION WITH CORONARY ANGIOGRAM N/A 10/30/2012   Procedure: LEFT HEART  CATHETERIZATION WITH CORONARY ANGIOGRAM;  Surgeon: [name], MD;  Location: [place] C[facility] LAB;  Service: Cardiovascular;  Laterality: N/A;  . MULTIPLE EXTRACTIONS WITH ALVEOLOPLASTY N/A 02/18/2019   Procedure: Extraction of tooth #'s 5-7, 10,11,14,20-29, 31 and 32 with alveoloplasty and maxiillary left buccal exostoses reductions;  Surgeon: [name], [name];  Location: [place];  Service: Oral Surgery;  Laterality: N/A;  . PERCUTANEOUS CORONARY ROTOBLATOR INTERVENTION (PCI-R) N/A 11/05/2012   Procedure: PERCUTANEOUS CORONARY ROTOBLATOR INTERVENTION (PCI-R);  Surgeon: [name], MD;  Location: [facility];  Service: Cardiovascular;  Laterality: N/A;  . TRACHEOSTOMY TUBE PLACEMENT N/A 02/18/2019   Procedure: Tracheostomy;  Surgeon: [name], MD;  Location: [place];  Service: ENT;  Laterality: N/A;  . UMBILICAL HERNIA REPAIR N/A 06/02/2015   Procedure: UMBILICAL HERNIORRHAPHY WITH MESH;  Surgeon: [name], MD;  Location: [facility];  Service: General;  Laterality: N/A;   HPI:  59 yo male with recent diagnosis of subglottic cancer - He is undergoing XRT and chemo stated 2 weeks ago.  Pt admitted with syncope - concern for GI bleed.  It was determined that he did not want endoscopy and hemopytsis was likely due to subglottis tumor.  Pt has a PEG and trach and uses a PMSV.  Recent pna 3/15 = right lower lobe . Pt reports he has been eating pudding/chicken noodle soup.  He denies dysphagia.   Assessment / Plan / Recommendation Clinical Impression  Patient presens with symptoms of mild dysphagia from his  subglottic cancer.  He is currently undergoing radiation/chemo - started two weeks ago.  Taste changes reported.  No focal CN deficit noted and pt denies dysphagia after his CVA.  Mulitple swallows noted across all consistencies - observed on most recent MBS, baseline cough not coorelated to po.  Cough is strong and pt is able to clear via trach fortunately.  Recommend diet as tolerated to allow pt to  choose food he can manage.  Will follow up later to provide education re: swallowing and radiation/chemo with head and neck cancer as he advises he has not received information previously.  Encouraged pt to continue po intake even if having pain- at least water to decrease disuse muscle atrophy. SLP Visit Diagnosis: Dysphagia, pharyngeal phase (R13.13)    Aspiration Risk  Mild aspiration risk    Diet Recommendation Other (Comment)(as tolerated)   Liquid Administration via: Cup;Straw Medication Administration: Whole meds with liquid Supervision: Patient able to self feed Compensations: Slow rate;Small sips/bites Postural Changes: Seated upright at 90 degrees;Remain upright for at least 30 minutes after po intake    Other  Recommendations Oral Care Recommendations: Oral care BID   Follow up Recommendations        Frequency and Duration min 1 x/week  1 week       Prognosis Prognosis for Safe Diet Advancement: Good      Swallow Study   General Date of Onset: 04/03/19 HPI: 59 yo male with recent diagnosis of subglottic cancer - He is undergoing XRT and chemo stated 2 weeks ago.  Pt admitted with syncope - concern for GI bleed.  It was determined that he did not want endoscopy and hemopytsis was likely due to subglottis tumor.  Pt has a PEG and trach and uses a PMSV.  Recent pna 3/15 = right lower lobe . Pt reports he has been eating pudding/chicken noodle soup.  He denies dysphagia. Type of Study: Bedside Swallow Evaluation Temperature Spikes Noted: No Respiratory Status: Room air History of Recent Intubation: No Behavior/Cognition: Alert;Cooperative;Pleasant mood Oral Cavity Assessment: Within Functional Limits Oral Care Completed by SLP: No Oral Cavity - Dentition: Dentures, not available Vision: Functional for self-feeding Self-Feeding Abilities: Able to feed self Patient Positioning: Upright in bed Baseline Vocal Quality: Low vocal intensity Volitional Cough:  Strong Volitional Swallow: Able to elicit    Oral/Motor/Sensory Function Overall Oral Motor/Sensory Function: Within functional limits   Ice Chips Ice chips: Not tested   Thin Liquid Thin Liquid: Impaired Presentation: Cup;Straw Pharyngeal  Phase Impairments: Multiple swallows    Nectar Thick Nectar Thick Liquid: Not tested   Honey Thick Honey Thick Liquid: Not tested   Puree Puree: Impaired Presentation: Self Fed;Spoon Pharyngeal Phase Impairments: Multiple swallows   Solid     Solid: Not tested Other Comments: pt not eating solids at home and is undergoing XRT      Macario Golds 04/03/2019,8:04 AM  Kathleen Lime, MS [place] Office [phone]

## 2019-04-03 NOTE — Evaluation (Signed)
Physical Therapy Evaluation Patient Details Name: [name] MRN: [id number] DOB: May 05, 1960 Today's Date: 04/03/2019   History of Present Illness  Pt is a 59 y.o. male who presented to [facility] on [facility]19 for elective multiple dental extractions prior to initiating chemo and radiation therapy to newly diagnosed supraglottic posterior laryngeal squamous cell carcinoma.  During induction, airway was difficult and trach ultimately placed for airway protection. S/p G tube placement 2/22. PMH includes anxiety, carotid artery stenosis, chronic low back pain, CVA (2020).    Clinical Impression  [name] is 59 y.o. male admitted with above HPI and diagnosis. Patient is currently limited by functional impairments below (see PT problem list). Patient lives with his wife and is independent at baseline with occasional use of SPC for mobility. Patient will benefit from continued skilled PT interventions to address impairments and progress independence with mobility, recommending HHPT. Acute PT will follow and progress as able.     Follow Up Recommendations Home health PT    Equipment Recommendations  None recommended by PT(pt has RW and SPC at home)    Recommendations for Other Services       Precautions / Restrictions Precautions Precautions: Fall Restrictions Weight Bearing Restrictions: No      Mobility  Bed Mobility Overal bed mobility: Needs Assistance Bed Mobility: Supine to Sit Rolling: Supervision   Supine to sit: Supervision;HOB elevated     General bed mobility comments: pt takign some increased time and using bed rail, no assist required  Transfers Overall transfer level: Needs assistance Equipment used: 1 person hand held assist(IV pole) Transfers: Sit to/from Stand Sit to Stand: Min guard;Min assist         General transfer comment: close min guard for safety, pt with some unsteadiness with rising. pt using Rt UE to press up from EOB and Lt UE on grab bar by toilet.  pt reaching for IV pole for support after rising.  Ambulation/Gait Ambulation/Gait assistance: Min guard Gait Distance (Feet): 100 Feet Assistive device: IV Pole Gait Pattern/deviations: Step-through pattern Gait velocity: fair   General Gait Details: mild balance deficits observed during gait, however no overt LOB. Pt on 2L/min via [place] during gait and SpO2 on pulse ox noted to drop to 88 during gait and then increase to 96% with standing rest break. Tele-monitor showing SpO2 at 100%.   Stairs         Wheelchair Mobility    Modified Rankin (Stroke Patients Only)       Balance Overall balance assessment: Needs assistance Sitting-balance support: Feet supported Sitting balance-Leahy Scale: Good     Standing balance support: Single extremity supported;During functional activity Standing balance-Leahy Scale: Fair          Pertinent Vitals/Pain Pain Assessment: No/denies pain    Home Living Family/patient expects to be discharged to:: Private residence Living Arrangements: Spouse/significant other Available Help at Discharge: Family;Available 24 hours/day Type of Home: House Home Access: Stairs to enter Entrance Stairs-Rails: Left Entrance Stairs-Number of Steps: 3 Home Layout: One level Home Equipment: Grab bars - tub/shower;Cane - single point;Walker - 2 wheels      Prior Function Level of Independence: Independent         Comments: pt reports independence with occasional use of SPC for mobility in community. He is independent for ADL's including PEG care and drives himself to appointments. Pt's wife confirms he is fully independent with mobility and ADL's.     Hand Dominance   Dominant Hand: Right  Extremity/Trunk Assessment   Upper Extremity Assessment Upper Extremity Assessment: Defer to OT evaluation    Lower Extremity Assessment Lower Extremity Assessment: Overall WFL for tasks assessed    Cervical / Trunk Assessment Cervical / Trunk  Assessment: Normal  Communication   Communication: Passy-Muir valve  Cognition Arousal/Alertness: Awake/alert Behavior During Therapy: WFL for tasks assessed/performed Overall Cognitive Status: Within Functional Limits for tasks assessed           General Comments      Exercises     Assessment/Plan    PT Assessment Patient needs continued PT services  PT Problem List Decreased strength;Decreased activity tolerance;Decreased balance;Decreased mobility;Decreased knowledge of use of DME       PT Treatment Interventions Gait training;DME instruction;Stair training;Functional mobility training;Therapeutic activities;Therapeutic exercise;Balance training;Patient/family education    PT Goals (Current goals can be found in the Care Plan section)  Acute Rehab PT Goals Patient Stated Goal: to get home and back to fishing PT Goal Formulation: With patient Time For Goal Achievement: 04/17/19 Potential to Achieve Goals: Good    Frequency Min 3X/week    AM-PAC PT "6 Clicks" Mobility  Outcome Measure Help needed turning from your back to your side while in a flat bed without using bedrails?: None Help needed moving from lying on your back to sitting on the side of a flat bed without using bedrails?: A Little Help needed moving to and from a bed to a chair (including a wheelchair)?: A Little Help needed standing up from a chair using your arms (e.g., wheelchair or bedside chair)?: A Little Help needed to walk in hospital room?: A Little Help needed climbing 3-5 steps with a railing? : A Little 6 Click Score: 19    End of Session Equipment Utilized During Treatment: Gait belt;Oxygen Activity Tolerance: Patient tolerated treatment well Patient left: in bed;with bed alarm set;with call bell/phone within reach;with family/visitor present Nurse Communication: Mobility status PT Visit Diagnosis: Muscle weakness (generalized) (M62.81);Other abnormalities of gait and mobility  (R26.89);Difficulty in walking, not elsewhere classified (R26.2)    Time: 3710-6269 PT Time Calculation (min) (ACUTE ONLY): 31 min   Charges:   PT Evaluation $PT Eval Moderate Complexity: 1 Mod          Gwynneth Albright PT, DPT Physical Therapist with [facility] [phone]  04/03/2019 1:23 PM

## 2019-04-03 NOTE — Progress Notes (Signed)
     Progress Note    ASSESSMENT AND PLAN:   # Supraglottis malignancy --Receiving chemotherapy / radiation --Has gastrostomy tube for enteral feedings but taking some PO  # Normocytic anemia / ? "dark", heme positive stools ( patient gives vague history about color of stools) in setting of NSAIDS (wasn't taking plavix) --Anemia likely multifactorial but could be leaking blood from tumor. Had brown stool yesterday, no BMs today. He declined EGD yesterday. At this point will hold off on endoscopic evaluation unless GI bleeding becomes evident.  --I think hgb is stable, just equilibrating post one unit of blood two days ago , 6.4 >>> 8.4 >>> 7.8.  --GI will sign off but available for questions  # Thrombocytopenia, 131.  --on chemotherapy.   # Elevated Troponin --Cardiology evaluated this am and suspects demand ischemia in setting of hypotension and anemia. Getting echo.  --Asking about resumption of ASA. This should be fine from GI standpoint      SUBJECTIVE    No complaints. Restarting enteral feedings and also ate some pudding. No BMs today.    OBJECTIVE:     Vital signs in last 24 hours: Temp:  [97.6 F (36.4 C)-98.6 F (37 C)] 98.6 F (37 C) (04/01 0424) Pulse Rate:  [75-107] 79 (04/01 0424) Resp:  [17-20] 18 (04/01 0424) BP: (107-120)/(84-92) 120/87 (04/01 0424) SpO2:  [92 %-97 %] 92 % (04/01 0909) Last BM Date: 04/02/19 General:   Alert, well-developed male in NAD EENT:  Normal hearing, non icteric sclera   Heart:  Regular rate and rhythm  Pulm: Normal respiratory effort   Abdomen:  Soft, nondistended, nontender.  Normal bowel sounds.          Neurologic:  Alert and  oriented x4;  grossly normal neurologically. Psych:  Pleasant, cooperative.  Normal mood and affect.   Intake/Output from previous day: 03/31 0701 - 04/01 0700 In: 532.6 [I.V.:532.6] Out: 200 [Urine:200] Intake/Output this shift: No intake/output data recorded.  Lab Results: Recent  Labs    04/01/19 1440 04/02/19 0437 04/03/19 0542  WBC 3.2* 3.3* 3.9*  HGB 6.4* 8.4* 7.8*  HCT 20.3* 25.8* 23.6*  PLT 144* 129* 131*   BMET Recent Labs    04/01/19 1440 04/02/19 0437 04/03/19 0542  NA 136 133* 132*  K 2.8* 5.1 4.4  CL 108 104 101  CO2 19* 22 24  GLUCOSE 126* 118* 114*  BUN 14 16 22*  CREATININE 0.72 0.90 0.81  CALCIUM 6.5* 8.2* 8.2*   LFT Recent Labs    04/01/19 1440  PROT 4.9*  ALBUMIN 2.5*  AST 23  ALT 24  ALKPHOS 41  BILITOT 0.4   PT/INR No results for input(s): LABPROT, INR in the last 72 hours. Hepatitis Panel No results for input(s): HEPBSAG, HCVAB, HEPAIGM, HEPBIGM in the last 72 hours.  DG Chest Port 1 View  Result Date: 04/01/2019 CLINICAL DATA:  Syncope EXAM: PORTABLE CHEST 1 VIEW COMPARISON:  03/17/2019 FINDINGS: Two frontal views of the chest demonstrates stable tracheostomy tube and right-sided PICC. Cardiac silhouette is unremarkable. No airspace disease, effusion, or pneumothorax. No acute bony abnormalities. IMPRESSION: 1. No acute intrathoracic process. Electronically Signed   By: Randa Ngo M.D.   On: 04/01/2019 15:04     Active Problems:   GI bleed   Malnutrition of moderate degree     LOS: 2 days   Tye Savoy ,NP 04/03/2019, 9:57 AM

## 2019-04-03 NOTE — Progress Notes (Signed)
PROGRESS NOTE  [name] MLY:[id number] DOB: 12/11/1960 DOA: 04/01/2019 PCP: [name], MD  HPI/Recap of past 24 hours: [name] is a 59 y.o. male with medical history significant for malignant neoplasm of supraglottis post tracheostomy, dysphagia status post PEG tube placement, coronary artery disease status post PCI with stenting in 2014, CVA, chronic lower back pain, chronic anxiety/depression, essential hypertension, hyperlipidemia who presented to [facility] ED sent from his oncologist office after passing out.  He was receiving radiation therapy, felt lightheaded and passed out.  This has happened in the past, received IV fluid with improvement.  States he was in his usual state of health prior to this.  He drove himself to radiation today.  Reports more frequent dark watery stools.  Denies nausea or abdominal pain.  Denies use of NSAIDs, states he used Tylenol for his arthritis and lower back pain.  No prior colonoscopy.  On presentation to the ED hypotensive and tachycardic.  Work-up revealed drop in hemoglobin down to 6.4.  Positive FOBT.  GI called for consult.  TRH asked to admit.  ED Course: Started on IV fluids and 2 units PRBCs ordered for transfusion.  Reports some chest discomfort 3/31 AM.  Trop peaked at 468 and trended down.  No evidence of acute ischemia on 12 lead EKG.   Seen by cardiology.  04/03/19: Feels better this morning.  Refused radiation yesterday.  States it drains his energy and only wants it 3 times a week instead of 5 times a week.  Passed his swallow evaluation by speech therapy.  Assessment/Plan: Active Problems:   GI bleed   Malnutrition of moderate degree  Presumed upper GI bleed Decline EGD on 04/02/19 Worsening anemia suspect multifactorial Presented with hemoglobin of 6.4 and positive FOBT Denies abdominal pain or nausea. Transfuse 1 unit PRBC Hg 8.4 from 6.4 after 1U PRBC On IV PPI twice daily GI following  Elevated troponin suspect demand  ischemia in the setting of severe anemia and hypotension Trop S Peaked at 468 and trending down  No evidence of acute ischemia on 12 lead EKG. Seen by cardiology, suspicion for demand ischemia from anemia and hypotension 2D echo ordered  Resolved Diarrhea in the setting of gastric tube feeding  Dysphagia status post PEG tube placement Dietitian consult to initiate PEG tube feeding  Passed swallow eval Follow speech recs  Anemia of chronic disease in the setting of malignancy Iron studies suggestive of iron defficiency Received 1 U PRBC Drop Hg 7.8; denies melena or hematochezia Suspect related to his malignancy  Resolved Hypokalemia Presented with potassium of 2.8>> 5.1.  Repleted Magnesium level 2.1 on 03/27/2019  Syncope, suspect in the setting of dehydration Twelve-lead EKG no evidence of acute ischemia Obtain orthostatic vital signs PT rec HHPT Fall precautions  Malignant neoplasm of supraglottis Follows with oncology and radiation oncology outpatient Ongoing plans for radiation  Chronic anxiety/depression Continue home medication  Recent right lower lobe pneumonia Diagnosed 2 weeks ago States he completed his oral abx on 04/01/19. No evidence of active infective process O2 sat 96% on room air   DVT prophylaxis: SCDs; chemical DVT prophylaxis contraindicated in the setting of suspected GI bleed  Code Status: Full code as stated by the patient himself  Family Communication: Updated the patient's wife at bedside.  Disposition Plan:  Patient is from home.  Anticipate discharge to home in the next 24 hours or when cardiology and GI sign off.   Consults called: GI consulted by EDP, cardiology.  Objective: Vitals:   04/03/19 0424 04/03/19 0741 04/03/19 0909 04/03/19 1242  BP: 120/87   (!) 130/92  Pulse: 79   78  Resp: 18     Temp: 98.6 F (37 C)   98.4 F (36.9 C)  TempSrc: Oral   Oral  SpO2: 95% 93% 92% 91%  Weight:      Height:         Intake/Output Summary (Last 24 hours) at 04/03/2019 1514 Last data filed at 04/03/2019 1434 Gross per 24 hour  Intake 662.59 ml  Output 375 ml  Net 287.59 ml   Filed Weights   04/01/19 1423  Weight: 76 kg    Exam:  . General: 59 y.o. year-old male Well developed well nourished in NAD A&O x 3. Post tracheostomy. . Cardiovascular: RRR no rubs or gallops . Respiratory: CTA no wheezes or rales . Abdomen: Soft NT NBS peg tube in place. . Musculoskeletal: No LE edema . Psychiatry: Mood is appropriate  Data Reviewed: CBC: Recent Labs  Lab 04/01/19 1440 04/02/19 0437 04/03/19 0542  WBC 3.2* 3.3* 3.9*  NEUTROABS 2.3  --   --   HGB 6.4* 8.4* 7.8*  HCT 20.3* 25.8*  25.0* 23.6*  MCV 98.5 95.2 97.9  PLT 144* 129* 017*   Basic Metabolic Panel: Recent Labs  Lab 04/01/19 1440 04/02/19 0437 04/03/19 0542  NA 136 133* 132*  K 2.8* 5.1 4.4  CL 108 104 101  CO2 19* 22 24  GLUCOSE 126* 118* 114*  BUN 14 16 22*  CREATININE 0.72 0.90 0.81  CALCIUM 6.5* 8.2* 8.2*  MG  --  1.6*  --   PHOS  --  2.6  --    GFR: Estimated Creatinine Clearance: 108.2 mL/min (by C-G formula based on SCr of 0.81 mg/dL). Liver Function Tests: Recent Labs  Lab 04/01/19 1440  AST 23  ALT 24  ALKPHOS 41  BILITOT 0.4  PROT 4.9*  ALBUMIN 2.5*   No results for input(s): LIPASE, AMYLASE in the last 168 hours. No results for input(s): AMMONIA in the last 168 hours. Coagulation Profile: No results for input(s): INR, PROTIME in the last 168 hours. Cardiac Enzymes: No results for input(s): CKTOTAL, CKMB, CKMBINDEX, TROPONINI in the last 168 hours. BNP (last 3 results) No results for input(s): PROBNP in the last 8760 hours. HbA1C: No results for input(s): HGBA1C in the last 72 hours. CBG: No results for input(s): GLUCAP in the last 168 hours. Lipid Profile: No results for input(s): CHOL, HDL, LDLCALC, TRIG, CHOLHDL, LDLDIRECT in the last 72 hours. Thyroid Function Tests: No results for  input(s): TSH, T4TOTAL, FREET4, T3FREE, THYROIDAB in the last 72 hours. Anemia Panel: Recent Labs    04/02/19 0437  VITAMINB12 573  FERRITIN 626*  TIBC 288  IRON 36*  RETICCTPCT 0.7   Urine analysis:    Component Value Date/Time   COLORURINE YELLOW 03/17/2019 1600   APPEARANCEUR CLEAR 03/17/2019 1600   LABSPEC 1.021 03/17/2019 1600   PHURINE 6.0 03/17/2019 1600   GLUCOSEU NEGATIVE 03/17/2019 1600   HGBUR NEGATIVE 03/17/2019 1600   BILIRUBINUR NEGATIVE 03/17/2019 1600   KETONESUR NEGATIVE 03/17/2019 1600   PROTEINUR 30 (A) 03/17/2019 1600   NITRITE NEGATIVE 03/17/2019 1600   LEUKOCYTESUR NEGATIVE 03/17/2019 1600   Sepsis Labs: '@LABRCNTIP'$ (procalcitonin:4,lacticidven:4)  ) Recent Results (from the past 240 hour(s))  SARS CORONAVIRUS 2 (TAT 6-24 HRS) Nasopharyngeal Nasopharyngeal Swab     Status: None   Collection Time: 04/01/19  6:13 PM   Specimen: Nasopharyngeal  Swab  Result Value Ref Range Status   SARS Coronavirus 2 NEGATIVE NEGATIVE Final    Comment: (NOTE) SARS-CoV-2 target nucleic acids are NOT DETECTED. The SARS-CoV-2 RNA is generally detectable in upper and lower respiratory specimens during the acute phase of infection. Negative results do not preclude SARS-CoV-2 infection, do not rule out co-infections with other pathogens, and should not be used as the sole basis for treatment or other patient management decisions. Negative results must be combined with clinical observations, patient history, and epidemiological information. The expected result is Negative. Fact Sheet for Patients: SugarRoll.be Fact Sheet for Healthcare Providers: https://www.woods-mathews.com/ This test is not yet approved or cleared by the [place] FDA and  has been authorized for detection and/or diagnosis of SARS-CoV-2 by FDA under an Emergency Use Authorization (EUA). This EUA will remain  in effect (meaning this test can be used) for the  duration of the COVID-19 declaration under Section 56 4(b)(1) of the Act, 21 U.S.C. section 360bbb-3(b)(1), unless the authorization is terminated or revoked sooner. Performed at [facility], [place] [address], [place], [place] [address]       Studies: ECHOCARDIOGRAM COMPLETE  Result Date: 04/03/2019    ECHOCARDIOGRAM REPORT   Patient Name:   [name] Date of Exam: 04/03/2019 Medical Rec #:  [id number]     Height:       72.0 in Accession #:    [id number]    Weight:       167.5 lb Date of Birth:  [birth year]     BSA:          1.976 m Patient Age:    32 years      BP:           130/92 mmHg Patient Gender: M             HR:           74 bpm. Exam Location:  Inpatient Procedure: 2D Echo, Cardiac Doppler and Color Doppler Indications:    R55 Syncope  History:        Patient has prior history of Echocardiogram examinations, most                 recent 08/01/2018. CAD, Stroke; Risk Factors:Hypertension,                 Dyslipidemia and GERD. Cancer.  Sonographer:    Tiffany Dance Referring Phys: [id number] [place]  1. Normal LV systolic function; grade 1 diastolic dysfunction; mild LVH; moderately to severely dilated aortic root; suggest CTA or MRA to further assess; trace AI.  2. Left ventricular ejection fraction, by estimation, is 55 to 60%. The left ventricle has normal function. The left ventricle has no regional wall motion abnormalities. There is mild left ventricular hypertrophy. Left ventricular diastolic parameters are consistent with Grade I diastolic dysfunction (impaired relaxation).  3. Right ventricular systolic function is normal. The right ventricular size is normal. There is mildly elevated pulmonary artery systolic pressure.  4. The mitral valve is normal in structure. Trivial mitral valve regurgitation. No evidence of mitral stenosis.  5. The aortic valve is tricuspid. Aortic valve regurgitation is trivial. No aortic stenosis is present.  6. Aortic dilatation noted.  There is moderate to severe dilatation of the aortic root measuring 48 mm.  7. The inferior vena cava is dilated in size with >50% respiratory variability, suggesting right atrial pressure of 8 mmHg. FINDINGS  Left Ventricle: Left ventricular ejection fraction,  by estimation, is 55 to 60%. The left ventricle has normal function. The left ventricle has no regional wall motion abnormalities. The left ventricular internal cavity size was normal in size. There is  mild left ventricular hypertrophy. Left ventricular diastolic parameters are consistent with Grade I diastolic dysfunction (impaired relaxation). Right Ventricle: The right ventricular size is normal. Right ventricular systolic function is normal. There is mildly elevated pulmonary artery systolic pressure. The tricuspid regurgitant velocity is 2.52 m/s, and with an assumed right atrial pressure of 8 mmHg, the estimated right ventricular systolic pressure is 53.6 mmHg. Left Atrium: Left atrial size was normal in size. Right Atrium: Right atrial size was normal in size. Pericardium: There is no evidence of pericardial effusion. Mitral Valve: The mitral valve is normal in structure. Normal mobility of the mitral valve leaflets. Trivial mitral valve regurgitation. No evidence of mitral valve stenosis. Tricuspid Valve: The tricuspid valve is normal in structure. Tricuspid valve regurgitation is mild . No evidence of tricuspid stenosis. Aortic Valve: The aortic valve is tricuspid. Aortic valve regurgitation is trivial. Aortic regurgitation PHT measures 400 msec. No aortic stenosis is present. Pulmonic Valve: The pulmonic valve was normal in structure. Pulmonic valve regurgitation is trivial. No evidence of pulmonic stenosis. Aorta: The aortic root is normal in size and structure and aortic dilatation noted. There is moderate to severe dilatation of the aortic root measuring 48 mm. Venous: The inferior vena cava is dilated in size with greater than 50% respiratory  variability, suggesting right atrial pressure of 8 mmHg. IAS/Shunts: No atrial level shunt detected by color flow Doppler. Additional Comments: Normal LV systolic function; grade 1 diastolic dysfunction; mild LVH; moderately to severely dilated aortic root; suggest CTA or MRA to further assess; trace AI.  LEFT VENTRICLE PLAX 2D LVIDd:         4.10 cm  Diastology LVIDs:         2.60 cm  LV e' lateral:   7.83 cm/s LV PW:         1.50 cm  LV E/e' lateral: 7.1 LV IVS:        1.30 cm  LV e' medial:    6.74 cm/s LVOT diam:     2.30 cm  LV E/e' medial:  8.2 LV SV:         72 LV SV Index:   36 LVOT Area:     4.15 cm  RIGHT VENTRICLE             IVC RV Basal diam:  2.80 cm     IVC diam: 2.40 cm RV S prime:     10.00 cm/s TAPSE (M-mode): 1.6 cm LEFT ATRIUM             Index       RIGHT ATRIUM           Index LA diam:        4.30 cm 2.18 cm/m  RA Area:     15.30 cm LA Vol (A2C):   50.6 ml 25.61 ml/m RA Volume:   36.70 ml  18.57 ml/m LA Vol (A4C):   36.2 ml 18.32 ml/m LA Biplane Vol: 44.3 ml 22.42 ml/m  AORTIC VALVE LVOT Vmax:   103.00 cm/s LVOT Vmean:  57.200 cm/s LVOT VTI:    0.173 m AI PHT:      400 msec  AORTA Ao Root diam: 4.80 cm Ao Asc diam:  3.40 cm MITRAL VALVE  TRICUSPID VALVE MV Area (PHT): 2.91 cm    TR Peak grad:   25.4 mmHg MV Decel Time: 261 msec    TR Vmax:        252.00 cm/s MV E velocity: 55.50 cm/s MV A velocity: 74.40 cm/s  SHUNTS MV E/A ratio:  0.75        Systemic VTI:  0.17 m                            Systemic Diam: 2.30 cm Kirk Ruths MD Electronically signed by Kirk Ruths MD Signature Date/Time: 04/03/2019/3:11:19 PM    Final     Scheduled Meds: . Chlorhexidine Gluconate Cloth  6 each Topical Daily  . DULoxetine  30 mg Oral Daily  . feeding supplement (OSMOLITE 1.5 CAL)  237 mL Per Tube 5 X Daily  . feeding supplement (PRO-STAT SUGAR FREE 64)  30 mL Per Tube TID  . free water  100 mL Per Tube 5 X Daily  . metoprolol tartrate  12.5 mg Per Tube BID  . pantoprazole  sodium  40 mg Per Tube BID  . Tracheostomy Care  1 kit Does not apply Daily    Continuous Infusions: . sodium chloride Stopped (04/01/19 1918)     LOS: 2 days     Kayleen Memos, MD [facility] Pager [phone]  If 7PM-7AM, please contact night-coverage [email or web address] Password [facility] 04/03/2019, 3:14 PM

## 2019-04-03 NOTE — Progress Notes (Incomplete)
? ? ? ?  Progress Note ? ? ? ?ASSESSMENT AND PLAN:  ? ?--hgb 7.8 likely equilibration ( hgb increased 2 grams with only one u PRBC). ? ?  ?SUBJECTIVE  ? ? ? ? ? ?OBJECTIVE:   ? ? ?Vital signs in last 24 hours: ?Temp:  [97.6 ?F (36.4 ?C)-98.6 ?F (37 ?C)] 98.6 ?F (37 ?C) (04/01 0424) ?Pulse Rate:  [75-107] 79 (04/01 0424) ?Resp:  [17-20] 18 (04/01 0424) ?BP: (107-120)/(84-92) 120/87 (04/01 0424) ?SpO2:  [92 %-97 %] 93 % (04/01 0741) ?Last BM Date: 04/02/19 ?General:   Alert, well-developed male in NAD ?EENT:  Normal hearing, non icteric sclera   ?Heart:  Regular rate and rhythm;  No lower extremity edema   ?Pulm: Normal respiratory effort   ?Abdomen:  Soft, nondistended, nontender.  Normal bowel sounds.          ?Neurologic:  Alert and  oriented x4;  grossly normal neurologically. ?Psych:  Pleasant, cooperative.  Normal mood and affect. ? ? ?Intake/Output from previous day: ?03/31 0701 - 04/01 0700 ?In: 532.6 [I.V.:532.6] ?Out: 200 [Urine:200] ?Intake/Output this shift: ?No intake/output data recorded. ? ?Lab Results: ?Recent Labs  ?  04/01/19 ?1440 04/02/19 ?0947 04/03/19 ?0542  ?WBC 3.2* 3.3* 3.9*  ?HGB 6.4* 8.4* 7.8*  ?HCT 20.3* 25.8* 23.6*  ?PLT 144* 129* 131*  ? ?BMET ?Recent Labs  ?  04/01/19 ?1440 04/02/19 ?0962 04/03/19 ?0542  ?NA 136 133* 132*  ?K 2.8* 5.1 4.4  ?CL 108 104 101  ?CO2 19* 22 24  ?GLUCOSE 126* 118* 114*  ?BUN 14 16 22*  ?CREATININE 0.72 0.90 0.81  ?CALCIUM 6.5* 8.2* 8.2*  ? ?LFT ?Recent Labs  ?  04/01/19 ?1440  ?PROT 4.9*  ?ALBUMIN 2.5*  ?AST 23  ?ALT 24  ?ALKPHOS 41  ?BILITOT 0.4  ? ?PT/INR ?No results for input(s): LABPROT, INR in the last 72 hours. ?Hepatitis Panel ?No results for input(s): HEPBSAG, HCVAB, HEPAIGM, HEPBIGM in the last 72 hours. ? ?DG Chest Port 1 View ? ?Result Date: 04/01/2019 ? CLINICAL DATA:  Syncope EXAM: PORTABLE CHEST 1 VIEW COMPARISON:  03/17/2019 FINDINGS: Two frontal views of the chest demonstrates stable tracheostomy tube and right-sided PICC. Cardiac silhouette is unremarkable. No airspace disease, effusion, or pne

## 2019-04-03 NOTE — Progress Notes (Signed)
Occupational Therapy Treatment Patient Details Name: [name] MRN: [id number] DOB: 1960-12-23 Today's Date: 04/03/2019    History of present illness Pt is a 59 y.o. male who [facility] for elective multiple dental extractions prior to initiating chemo and radiation therapy to newly diagnosed supraglottic posterior laryngeal squamous cell carcinoma.  During induction, airway was difficult and trach ultimately placed for airway protection. S/p G tube placement 2/22. PMH includes anxiety, carotid artery stenosis, chronic low back pain, CVA (2020).   OT comments  Pt progressing towards OT goals this session, improved nausea and Pt able to perform transfers at min guard assist, ambulation in hallway with PT utilizing IV pole for balance, min guARd A for toilet transfer, set up for peri care, and min guard for standing grooming at sink. Today Pt was mod A for LB ADL (sometime his wife helps him at home, but mostly independent). Pt with drastic improvement from last session, OT will continue to follow acutely and focus on energy conservation, LB ADL and activity tolerance. Pt hopeful to wean off O2 (not on it at home) on 2L throughout session and SpO2 >95% throughout.   Follow Up Recommendations  No OT follow up;Supervision - Intermittent    Equipment Recommendations  None recommended by OT    Recommendations for Other Services      Precautions / Restrictions Precautions Precautions: Fall Restrictions Weight Bearing Restrictions: No       Mobility Bed Mobility Overal bed mobility: Needs Assistance Bed Mobility: Supine to Sit Rolling: Supervision         General bed mobility comments: no physical assisted needed, extra time required  Transfers Overall transfer level: Needs assistance Equipment used: 1 person hand held assist(IV pole) Transfers: Sit to/from Stand Sit to Stand: Min guard;Min assist         General transfer comment: slight assist for balance  and safety    Balance Overall balance assessment: Needs assistance Sitting-balance support: Feet supported Sitting balance-Leahy Scale: Good     Standing balance support: Single extremity supported;During functional activity Standing balance-Leahy Scale: Fair                             ADL either performed or assessed with clinical judgement   ADL Overall ADL's : Needs assistance/impaired     Grooming: Wash/dry face;Wash/dry hands;Oral care;Min guard;Standing Grooming Details (indicate cue type and reason): sink level today                 Toilet Transfer: Min guard;Minimal assistance(IV pole for support) Toilet Transfer Details (indicate cue type and reason): into bathroom Toileting- Clothing Manipulation and Hygiene: Set up;Sitting/lateral lean Toileting - Clothing Manipulation Details (indicate cue type and reason): for rear peri care     Functional mobility during ADLs: Min guard;Minimal assistance(utilizing IV pole for support/balance - cane in room)       Vision   Vision Assessment?: No apparent visual deficits   Perception     Praxis      Cognition Arousal/Alertness: Awake/alert Behavior During Therapy: WFL for tasks assessed/performed Overall Cognitive Status: Within Functional Limits for tasks assessed                                          Exercises     Shoulder Instructions  General Comments      Pertinent Vitals/ Pain       Pain Assessment: No/denies pain  Home Living Family/patient expects to be discharged to:: Private residence Living Arrangements: Spouse/significant other Available Help at Discharge: Family;Available 24 hours/day Type of Home: House Home Access: Stairs to enter [employer] of Steps: 3 Entrance Stairs-Rails: Left Home Layout: One level     Bathroom Shower/Tub: Teacher, early years/pre: Standard     Home Equipment: Grab bars - tub/shower;Cane - single  point;Walker - 2 wheels          Prior Functioning/Environment              Frequency  Min 2X/week        Progress Toward Goals  OT Goals(current goals can now be found in the care plan section)  Progress towards OT goals: Progressing toward goals  Acute Rehab OT Goals Patient Stated Goal: to get home and back to fishing OT Goal Formulation: With patient/family Time For Goal Achievement: 04/16/19 Potential to Achieve Goals: Good  Plan Discharge plan needs to be updated;Frequency remains appropriate    Co-evaluation                 AM-PAC OT "6 Clicks" Daily Activity     Outcome Measure   Help from another person eating meals?: None Help from another person taking care of personal grooming?: A Little Help from another person toileting, which includes using toliet, bedpan, or urinal?: A Little Help from another person bathing (including washing, rinsing, drying)?: A Little Help from another person to put on and taking off regular upper body clothing?: A Little Help from another person to put on and taking off regular lower body clothing?: A Little 6 Click Score: 19    End of Session Equipment Utilized During Treatment: Gait belt;Rolling walker;Oxygen(2L)  OT Visit Diagnosis: Unsteadiness on feet (R26.81);Other abnormalities of gait and mobility (R26.89);Muscle weakness (generalized) (M62.81)   Activity Tolerance Patient tolerated treatment well   Patient Left in bed;with call bell/phone within reach;with bed alarm set;with family/visitor present   Nurse Communication Mobility status        Time: 3200-3794 OT Time Calculation (min): 31 min  Charges: OT General Charges $OT Visit: 1 Visit OT Treatments $Self Care/Home Management : 8-22 mins  Jesse Sans OTR/L Acute Rehabilitation Services Pager: [phone] Office: [place] 04/03/2019, 11:52 AM

## 2019-04-03 NOTE — Consult Note (Addendum)
Cardiology Consultation:   Patient ID: [name] MRN: [id number]; DOB: 13-Mar-1960  Admit date: 04/01/2019 Date of Consult: 04/03/2019  Primary Care Provider: [name], MD Primary Cardiologist: Kate Sable, MD  Primary Electrophysiologist:  None    Patient Profile:   [name] is a 59 y.o. 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 Nevada Crane.  History of Present Illness:   [name] is a 59 year old male with the above history who is followed by Dr. Bronson Ing. He has a history of CAD s/p rotational atherectomy and DES to proximal LAD in 11/2012. In 08/2018, he was admitted to [facility] with acute CVA due to infarction of the pons. Cerebral angiogram during this admission showed bilateral vertebral artery occlusion and 65% stenosis of the proximal right ICA. Dual antiplatelet therapy with Aspirin and Plavix were recommended for 3 months and then Plavix monotherapy. 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 He was last seen by Bernerd Pho, PA-C, on 02/11/2019 at which time he continued to have some residual left sided weakness from his stroke but was stable from a cardiac standpoint.   Patient presented to the [facility] ED on 04/01/2019 for further evaluation of syncope following a radiation treatment. Patient states he went to radiation and after he was done and went to leave he felt lightheaded like he had stood up too quickly. He thought that it would go away but it  did not and then he passed out. BP 74/53 at the time. He denies any other prodromal symptoms prior to syncopal episode - no chest pain, shortness of breath, palpitations, diaphoresis, nausea. He has never had this happen before. He was doing relatively well before this. No recent illnesses. He had one episode of vomiting 2 nights ago after receiving medicine through his gastrostomy tube while in the lying position. However, no other emesis. He states he has been eating and drinking well. No orthopnea, PND, or edema. He reports his stools have been dark but unclear if he has specially had any melena. No hematochezia, hematuria, or hematemesis. He states   In the ED, EKG showed no acute changes. High-sensitivity troponin elevated at 22 >> 341 >> 468 >> 464 >> 415. Chest x-ray showed no acute findings. WBC 3.2, Hgb 6.4 (down from 8.3 on 3/25), Plts 131. Na 136, K 2.8, Glucose 125, BUN 14, Cr 0.72. Albumin 2.5, AST 23, ALT 24, Alk Phos 41, Total Bili 0.4. Lactic acid 3.0. COVID-19 negative. FOBT positive. Patient was started on IV fluids and 2 units of PRBCs. GI was consulted and patient was admitted for further evaluation.   At the time of this evaluation, patient is resting comfortably. His O2 sats did drop to the 80's this morning but improved after being placed on 2L of O2. He notes a little substernal non-radiating chest tightness after being place on the O2 but states it is already starting to improve.   Past Medical History:  Diagnosis Date  . Anxiety   . Arthritis  back  . Bradycardia    a. During 11/2012 adm: lopressor decreased.  . Cancer of larynx ([place])   . Carotid artery stenosis   . 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 Coronary artery disease    a. Diag cath 10/2012 for CP/abnormal nuc -> planned PCI s/p LAD atherectomy/DES placement 11/05/12.  Marland Kitchen GERD (gastroesophageal reflux disease)   . History of hiatal  hernia   . Hypercholesteremia   . Hypertension   . Stroke ([place]) 07/31/2018   bilateral vertebrobasilar occlusion; "left side weaker thanit was before."    Past Surgical History:  Procedure Laterality Date  . CARDIAC CATHETERIZATION  10/29/2012  . CORONARY ANGIOPLASTY WITH STENT PLACEMENT  11/05/2012  . DIRECT LARYNGOSCOPY N/A 01/02/2019   Procedure: MICRO DIRECT LARYNGOSCOPY BIOPSY OF LARYNGEAL MASS;  Surgeon: [name], MD;  Location: [place] [facility];  Service: ENT;  Laterality: N/A;  . HEMORRHOID SURGERY  ~ 2010  . INSERTION OF MESH N/A 06/02/2015   Procedure: INSERTION OF MESH;  Surgeon: [name], MD;  Location: [facility];  Service: General;  Laterality: N/A;  . IR ANGIO INTRA EXTRACRAN SEL COM CAROTID INNOMINATE BILAT MOD SED  08/02/2018  . IR ANGIO VERTEBRAL SEL VERTEBRAL BILAT MOD SED  08/02/2018  . LAPAROSCOPIC INSERTION GASTROSTOMY TUBE N/A 02/24/2019   Procedure: LAPAROSCOPIC INSERTION GASTROSTOMY TUBE;  Surgeon: [name], MD;  Location: [place];  Service: General;  Laterality: N/A;  . LEFT HEART CATHETERIZATION WITH CORONARY ANGIOGRAM N/A 10/30/2012   Procedure: LEFT HEART CATHETERIZATION WITH CORONARY ANGIOGRAM;  Surgeon: [name], MD;  Location: [place] [facility];  Service: Cardiovascular;  Laterality: N/A;  . MULTIPLE EXTRACTIONS WITH ALVEOLOPLASTY N/A 02/18/2019   Procedure: Extraction of tooth #'s 5-7, 10,11,14,20-29, 31 and 32 with alveoloplasty and maxiillary left buccal exostoses reductions;  Surgeon: [name], [name];  Location: [place];  Service: Oral Surgery;  Laterality: N/A;  . PERCUTANEOUS CORONARY ROTOBLATOR INTERVENTION (PCI-R) N/A 11/05/2012   Procedure: PERCUTANEOUS CORONARY ROTOBLATOR INTERVENTION (PCI-R);  Surgeon: [name], MD;  Location: [facility] CATH LAB;  Service: Cardiovascular;  Laterality: N/A;  . TRACHEOSTOMY TUBE PLACEMENT N/A 02/18/2019   Procedure: Tracheostomy;  Surgeon: [name], MD;  Location: [place];  Service: ENT;  Laterality: N/A;  . UMBILICAL HERNIA  REPAIR N/A 06/02/2015   Procedure: UMBILICAL HERNIORRHAPHY WITH MESH;  Surgeon: [name], MD;  Location: [facility];  Service: General;  Laterality: N/A;     Home Medications:  Prior to Admission medications   Medication Sig Start Date End Date Taking? Authorizing Provider  amLODipine (NORVASC) 5 MG tablet Take 1 tablet (5 mg total) by mouth daily. 12/19/18  Yes Frann Rider, NP  aspirin EC 81 MG tablet Take 81 mg by mouth daily.   Yes [provider]  atorvastatin (LIPITOR) 80 MG tablet Take 1 tablet (80 mg total) by mouth every evening. 10/23/17  Yes Herminio Commons, MD  famotidine (PEPCID) 20 MG tablet Take 20 mg by mouth at bedtime.   Yes [provider]  HYDROcodone-acetaminophen (NORCO) 10-325 MG tablet Take 1 tablet by mouth every 4 (four) hours as needed. Patient taking differently: Take 1 tablet by mouth every 4 (four) hours as needed for severe pain.  06/02/15  Yes [name], MD  lidocaine (XYLOCAINE) 2 % solution Patient: Mix 1part 2% viscous lidocaine, 1part H20. Swish & swallow 41m of diluted mixture, 357m before meals  and at bedtime, up to QID Patient taking differently: Use as directed 15 mLs in the mouth or throat See admin instructions. 30 minutes before meals and at bedtime, up to 4 times daily. 03/10/19  Yes Eppie Gibson, MD  metoprolol tartrate (LOPRESSOR) 25 MG tablet TAKE 1/2 TABLET BY MOUTH TWICE DAILY Patient taking differently: Take 12.5 mg by mouth 2 (two) times daily.  01/25/15  Yes Herminio Commons, MD  naproxen (NAPROSYN) 250 MG tablet Take 250 mg by mouth 2 (two) times daily. 02/25/19  Yes [provider]  nitroGLYCERIN (NITROSTAT) 0.4 MG SL tablet Place 1 tablet (0.4 mg total) under the tongue every 5 (five) minutes as needed for chest pain (CP or SOB). 07/27/14  Yes Herminio Commons, MD  Nutritional Supplements (FEEDING SUPPLEMENT, OSMOLITE 1.5 CAL,) LIQD Give 2 cartons of osmolite 1.5 at 9:30am, 1:30, 5:30 and 1 carton at  9:30pm.  Flush with 53m of water before and 1229mafter each feeding. Meets 100% of patient needs. Send bolus supplies. Patient taking differently: Place 1,000 mLs into feeding tube in the morning, at noon, in the evening, and at bedtime. Give 2 cartons of osmolite 1.5 at 9:30am, 1:30, 5:30 and 1 carton at 9:30pm.  Flush with 6039mf water before and 120m5mter each feeding. Meets 100% of patient needs. Send bolus supplies. 03/19/19  Yes SquiEppie Gibson  Nutritional Supplements (PROSOURCE TF) LIQD Give 30ml34mimes per day via feeding tube.  Mix with 60ml 29mater.  Flush tube with 60ml o109mter, then place prosource and water mixture in tube and flush with 60ml of56mer after. Patient taking differently: Place 30 mLs into feeding tube in the morning and at bedtime. Mix with 60ml of 66mr.  Flush tube with 60ml of w29m, then place prosource and water mixture in tube and flush with 60ml of wa76mafter. 03/19/19  Yes Squire, SarEppie Gibsonazole (PRILOSEC) 20 MG capsule Take 20 mg by mouth daily.  07/22/18  Yes [provider]  traMADol (ULTRAM) 50 MG tablet Take 50-100 mg by mouth 4 (four) times daily as needed (pain.).  01/29/19  Yes [provider]  Catheters (ARGYLE SUCTION CATHETER 14FR) MISC 1 Device by Does not apply route daily. 02/25/19   Segal, JareHarold HedgeATIN IV Inject into the vein once a week. Weekly in conjunction with radiation therapy beginning 03/06/2019 03/04/19   [provider]  clopidogrel (PLAVIX) 75 MG tablet Take 1 tablet (75 mg total) by mouth daily. Patient not taking: Reported on 02/17/2019 12/24/18   McCue, JessFrann Rider Devices MISC 10 mL saline flushes - one box 03/03/19   Katragadda,[name] Devices MISC Osmolite 1.5 x 7 cartons daily 2 cartons at 8 am, 2 cartons at noon, 2 cartons at 4pm, 1 carton at 8pm Send bolus tube feeding supplies 03/21/19   Lockamy, Randi L, NP-C  Misc. Devices MISC ProSource or equivalent 30 ml twice daily  via feeding tube Patient not taking: Reported on 04/01/2019 03/21/19   Lockamy, Randi L, NP-C  prochlorperazine (COMPAZINE) 10 MG tablet TAKE 1 TABLET(10 MG) BY MOUTH EVERY 6 HOURS AS NEEDED FOR NAUSEA OR VOMITING 04/03/19   Katragadda,Derek Jackm chloride 0.9 % infusion Use as directed to flush PICC line 03/31/19   Katragadda,Derek Jackm chloride irrigation 0.9 % irrigation Irrigate with 200 mLs as directed continuous. 02/25/19   Segal, JareHarold Hedgeeostomy Care KIT 1  kit by Does not apply route daily. 02/25/19   Harold Hedge, MD    Inpatient Medications: Scheduled Meds: . Chlorhexidine Gluconate Cloth  6 each Topical Daily  . DULoxetine  30 mg Oral Daily  . feeding supplement (OSMOLITE 1.5 CAL)  237 mL Per Tube 5 X Daily  . feeding supplement (PRO-STAT SUGAR FREE 64)  30 mL Per Tube TID  . free water  100 mL Per Tube 5 X Daily  . metoprolol tartrate  12.5 mg Per Tube BID  . pantoprazole (PROTONIX) IV  40 mg Intravenous Q12H  . potassium chloride  20 mEq Per Tube Daily  . Tracheostomy Care  1 kit Does not apply Daily   Continuous Infusions: . sodium chloride Stopped (04/01/19 1918)  . sodium chloride     PRN Meds: acetaminophen (TYLENOL) oral liquid 160 mg/5 mL, ondansetron (ZOFRAN) IV, sodium chloride flush, traMADol  Allergies:    Allergies  Allergen Reactions  . Prednisone Other (See Comments)    Chest pain    Social History:   Social History   Socioeconomic History  . Marital status: Married    Spouse name: Not on file  . Number of children: 4  . Years of education: Not on file  . Highest education level: Not on file  Occupational History  . Occupation: Disabled  Tobacco Use  . Smoking status: Former Smoker    Packs/day: 3.00    Years: 32.00    Pack years: 96.00    Types: Cigarettes    Start date: 08/29/1977    Quit date: 11/29/2006    Years since quitting: 12.3  . Smokeless tobacco: Former Systems developer    Types: Chew  . Tobacco comment:  11/05/2012 "chewed tobacco when I play ball; aien't chewed since age 60"  Substance and Sexual Activity  . Alcohol use: Yes    Alcohol/week: 12.0 standard drinks    Types: 12 Cans of beer per week    Comment: Currently 3-4 beers per day. 02/13/19, was a heavy drinker in the past, - states he could drink a case a day   . Drug use: Yes    Types: Marijuana  . Sexual activity: Not Currently  Other Topics Concern  . Not on file  Social History Narrative   Patient is disabled.  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   Patient quit smoking 10 to 12 years ago.  Patient previously 3 pack/day use.   Patient currently drinking 3-4 beers per day from 12 pack a day.   Patient denies use of chew or other illicit drugs.   Social Determinants of Health   Financial Resource Strain: Low Risk   . Difficulty of Paying Living Expenses: Not hard at all  Food Insecurity: No Food Insecurity  . Worried About Charity fundraiser in the Last Year: Never true  . Ran Out of Food in the Last Year: Never true  Transportation Needs: No Transportation Needs  . Lack of Transportation (Medical): No  . Lack of Transportation (Non-Medical): No  Physical Activity: Insufficiently Active  . Days of Exercise per Week: 2 days  . Minutes of Exercise per Session: 30 min  Stress: Stress Concern Present  . Feeling of Stress : To some extent  Social Connections: Moderately Isolated  . Frequency of Communication with Friends and Family: Once a week  . Frequency of Social Gatherings with Friends and Family: Once a week  . Attends Religious Services: Never  . Active Member  of Clubs or Organizations: No  . Attends Archivist Meetings: Never  . Marital Status: Married  Human resources officer Violence: Not At Risk  . Fear of Current or Ex-Partner: No  . Emotionally Abused: No  . Physically Abused: No  . Sexually Abused: No    Family History:    Family History  Problem Relation Age of Onset   . Hypertension Mother   . Heart disease Father   . Hypertension Father   . Heart disease Sister   . Hypertension Maternal Grandmother   . Hypertension Maternal Grandfather   . Hypertension Paternal Grandmother   . Hypertension Paternal Grandfather      ROS:  Please see the history of present illness.  All other ROS reviewed and negative.     Physical Exam/Data:   Vitals:   04/02/19 2015 04/03/19 0008 04/03/19 0308 04/03/19 0424  BP:    120/87  Pulse: 75 80 75 79  Resp: '17 19 20 18  '$ Temp:    98.6 F (37 C)  TempSrc:    Oral  SpO2: 97% 92% 97% 95%  Weight:      Height:        Intake/Output Summary (Last 24 hours) at 04/03/2019 0738 Last data filed at 04/02/2019 1800 Gross per 24 hour  Intake 532.59 ml  Output 200 ml  Net 332.59 ml   Last 3 Weights 04/01/2019 03/27/2019 03/20/2019  Weight (lbs) 167 lb 8.8 oz 167 lb 9.6 oz 166 lb  Weight (kg) 76 kg 76.023 kg 75.297 kg     Body mass index is 22.72 kg/m.  General: 59 y.o. male resting comfortably in no acute distress. HEENT: Normocephalic and atraumatic.  Neck: Supple. Difficult to assess JVD due to trach collar. Heart: RRR. Distinct S1 and S2. No murmurs, gallops, or rubs. Radial pulses 2+ and equal bilaterally. Lungs: No increased work of breathing. Course breath sounds noted in right base.  Abdomen: Soft, non-distended, and non-tender to palpation. Bowel sounds present. Extremities: No lower extremity edema.    Skin: Warm and dry. Neuro: Alert and oriented x3. No focal deficits. Psych: Normal affect. Responds appropriately.  EKG:  The following EKGs were personally reviewed: - Initial EKG on 04/01/2019 demonstrates: Normal sinus rhythm, rate 89 bpm, with no acute ST/T changes. - Repeat EKG on 04/01/2019 at 19:12 demonstrates: Sinus tachycardia, rate 110 bpm, with T wave flattening/mild T wave inversions in leads V2-V3.  Telemetry:  Telemetry was personally reviewed and demonstrates:  Sinus rhythm with rates in the 70's  to 80's.  Relevant CV Studies:  Echocardiogram 08/01/2018: Impressions: 1. The left ventricle has normal systolic function, with an ejection  fraction of 55-60%. The cavity size was normal. There is mildly increased  left ventricular wall thickness. Left ventricular diastolic Doppler  parameters are consistent with impaired  relaxation.  2. The right ventricle has normal systolic function. The cavity was  normal. There is no increase in right ventricular wall thickness. Right  ventricular systolic pressure could not be assessed.  3. Aortic valve regurgitation is trivial by color flow Doppler. No  stenosis of the aortic valve.  4. No intracardiac thrombi or masses were visualized.  5. The interatrial septum was not well visualized.  6. The inferior vena cava was normal in size with <50% respiratory  variability.  1. The left ventricle has normal systolic function, with an ejection  fraction of 55-60%. The cavity size was normal. There is mildly increased  left ventricular wall thickness. Left ventricular diastolic  Doppler  parameters are consistent with impaired  relaxation.  2. The right ventricle has normal systolic function. The cavity was  normal. There is no increase in right ventricular wall thickness. Right  ventricular systolic pressure could not be assessed.  3. Aortic valve regurgitation is trivial by color flow Doppler. No  stenosis of the aortic valve.  4. No intracardiac thrombi or masses were visualized.  5. The interatrial septum was not well visualized.  6. The inferior vena cava was normal in size with <50% respiratory  variability.   Laboratory Data:  High Sensitivity Troponin:   Recent Labs  Lab 04/01/19 1440 04/01/19 1813 04/02/19 0437 04/02/19 0629 04/02/19 0811  TROPONINIHS 22* 341* 468* 464* 415*     Chemistry Recent Labs  Lab 04/01/19 1440 04/02/19 0437 04/03/19 0542  NA 136 133* 132*  K 2.8* 5.1 4.4  CL 108 104 101  CO2 19* 22  24  GLUCOSE 126* 118* 114*  BUN 14 16 22*  CREATININE 0.72 0.90 0.81  CALCIUM 6.5* 8.2* 8.2*  GFRNONAA >60 >60 >60  GFRAA >60 >60 >60  ANIONGAP '9 7 7    '$ Recent Labs  Lab 03/27/19 0925 04/01/19 1440  PROT 6.5 4.9*  ALBUMIN 3.1* 2.5*  AST 16 23  ALT 26 24  ALKPHOS 51 41  BILITOT 0.6 0.4   Hematology Recent Labs  Lab 04/01/19 1440 04/02/19 0437 04/03/19 0542  WBC 3.2* 3.3* 3.9*  RBC 2.06* 2.71*  2.72* 2.41*  HGB 6.4* 8.4* 7.8*  HCT 20.3* 25.8* 23.6*  MCV 98.5 95.2 97.9  MCH 31.1 31.0 32.4  MCHC 31.5 32.6 33.1  RDW 14.9 14.7 15.2  PLT 144* 129* 131*   BNPNo results for input(s): BNP, PROBNP in the last 168 hours.  DDimer No results for input(s): DDIMER in the last 168 hours.   Radiology/Studies:  DG Chest Port 1 View  Result Date: 04/01/2019 CLINICAL DATA:  Syncope EXAM: PORTABLE CHEST 1 VIEW COMPARISON:  03/17/2019 FINDINGS: Two frontal views of the chest demonstrates stable tracheostomy tube and right-sided PICC. Cardiac silhouette is unremarkable. No airspace disease, effusion, or pneumothorax. No acute bony abnormalities. IMPRESSION: 1. No acute intrathoracic process. Electronically Signed   By: Randa Ngo M.D.   On: 04/01/2019 15:04   { Assessment and Plan:   Syncope - Likely due to hypotension and acute anemia. Received IV fluids and 2 units of PRBCs with improvement. - No significant arrythmias noted on telemetry so far. Continue to monitor. - Will repeat Echo.   Elevated Troponin with Known CAD - History of  rotational atherectomy and DES to proximal LAD in 11/2012. - High-sensitivity troponin elevated at 22 >> 341 >> 468 >> 464 >> 415. - EKG shows no acute changes.  - Patient does report some substernal chest tightness this morning after being placed on O2. Will recheck EKG. - Echo pending. - Suspect demand ischemia in the setting of hypotension and acute anemia requiring transfusion. Do not suspect any additional ischemic evaluation at this time  but will wait on Echo. Patient would not be a good cath candidate at this time due to anemia. - Restart Aspirin when OK with GI. Recommend restarting home Lipitor '80mg'$  daily.   Hypoxia - O2 sats dropped to 80's today but improved when placed on nasal cannula. Patient denies any shortness of breath. He recently was treated for right lower lobe pneumonia. He does have course breath sounds in right base. - Does not appear volume overloaded so do not think  he needs diuresis at this time. - Management per primary team.   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 Cerebral angiogram also showed bilateral vertebral artery occlusion and 65% stenosis of the proximal right ICA.  - Completed 3 months of dual antiplatelet therapy with Aspirin and Plavix and now on Aspirin alone. - Restart Aspirin when OK with GI. Recommend restarting home Lipitor '80mg'$  daily.   Acute Anemia with Possible Upper GI Bleed - Hemoglobin 6.4 on admission, down from 8.3 on 3/25 and 14 in 12/2018 (before starting chemo). - Received 2 units of PRBCs. Hemoglobin 7.8 today. - GI consulted and recommended PPI daily. Per GI note yesterday, "holding off on an EGD unless it become more clear that he needs it.".  - Management per primary team and GI.   Hypokalemia - Potassium 2.8 on presentation.  - Repleted and 4.4 today.  - Continue to monitor.   Otherwise, per primary team.  - Dysphagia: s/p PEG tube - Malignant neoplasm of supraglottis - Chronic anxiety/depression   For questions or updates, please contact [place] Please consult [email or web address] for contact info under     Signed, Eppie Gibson  04/03/2019 7:38 AM   Attending Note:   The patient was seen and examined.  Agree with assessment and plan as noted above.  Changes made to the above note as needed.  Patient seen and independently examined with Sande Rives, PA .   We discussed all aspects of the encounter. I agree  with the assessment and plan as stated above.  1.   Elevated troponin levels: The patient has known coronary artery disease and has had several stents.  He developed severe anemia presumably due to a GI bleed and in the setting of hypotension and syncope had minimally elevated troponin levels.  These troponin levels are flat and are consistent with demand ischemia and not consistent with an acute coronary syndrome.  He is not had any symptoms consistent with unstable angina.  Specifically he denies having any symptoms similar to his previous angina that he had several years ago.  Furthermore, he is not a candidate for cath and repeat stenting even if he were to have coronary artery disease.  At this point he does not need any further cardiac work-up.  He needs a work-up for this anemia.  Would recommend restarting Plavix when okay with GI team. Echo to be done today   No additional cardiology recs.   2.  Hypertension: Was on amlodipine at home.  He presented with very low blood pressure presumably due to GI bleeding and anemia.  Would recommend restarting amlodipine if and when his blood pressure increases to above 140/90.   I have spent a total of 40 minutes with patient reviewing hospital  notes , telemetry, EKGs, labs and examining patient as well as establishing an assessment and plan that was discussed with the patient. > 50% of time was spent in direct patient care.  Hyperlipidemia: Continue atorvastatin.  4.  Anemia: The patient denies having any melena or dark tarry tarry stools.  He did have some dark stool several weeks ago.  Further work-up per the internal medicine team.  He may need a GI evaluation or perhaps hematology evaluation.  [facility] will sign off.   Medication Recommendations:   See above.   Restart Plavix if / when OK with the GI team. Other recommendations (labs, testing, etc):   Follow up as an  outpatient:  With Dr. Bronson Ing or APP several weeks after he is  discharged.    Thayer Headings, Brooke Bonito., MD, [facility] 04/03/2019, 1:30 PM [address]  [place] Pager [phone]

## 2019-04-04 ENCOUNTER — Ambulatory Visit: Payer: 59

## 2019-04-04 ENCOUNTER — Other Ambulatory Visit: Payer: Self-pay

## 2019-04-04 LAB — BASIC METABOLIC PANEL
Anion gap: 6 (ref 5–15)
BUN: 21 mg/dL — ABNORMAL HIGH (ref 6–20)
CO2: 27 mmol/L (ref 22–32)
Calcium: 7.9 mg/dL — ABNORMAL LOW (ref 8.9–10.3)
Chloride: 99 mmol/L (ref 98–111)
Creatinine, Ser: 0.7 mg/dL (ref 0.61–1.24)
GFR calc Af Amer: >60 mL/min (ref >60)
GFR calc non Af Amer: >60 mL/min (ref >60)
Glucose, Bld: 110 mg/dL — ABNORMAL HIGH (ref 70–99)
Potassium: 4 mmol/L (ref 3.5–5.1)
Sodium: 132 mmol/L — ABNORMAL LOW (ref 135–145)

## 2019-04-04 LAB — BPAM RBC
Blood Product Expiration Date: 202104172359
Blood Product Expiration Date: 202104182359
ISSUE DATE / TIME: 202103302215
Unit Type and Rh: 6200
Unit Type and Rh: 6200

## 2019-04-04 LAB — CBC
HCT: 22.6 % — ABNORMAL LOW (ref 39.0–52.0)
Hemoglobin: 7.4 g/dL — ABNORMAL LOW (ref 13.0–17.0)
MCH: 31.6 pg (ref 26.0–34.0)
MCHC: 32.7 g/dL (ref 30.0–36.0)
MCV: 96.6 fL (ref 80.0–100.0)
Platelets: 151 10*3/uL (ref 150–400)
RBC: 2.34 MIL/uL — ABNORMAL LOW (ref 4.22–5.81)
RDW: 14.9 % (ref 11.5–15.5)
WBC: 3.7 10*3/uL — ABNORMAL LOW (ref 4.0–10.5)
nRBC: 0.5 % — ABNORMAL HIGH (ref 0.0–0.2)

## 2019-04-04 LAB — TYPE AND SCREEN
ABO/RH(D): A POS
Antibody Screen: NEGATIVE
Unit division: 0
Unit division: 0

## 2019-04-04 LAB — TROPONIN I (HIGH SENSITIVITY): Troponin I (High Sensitivity): 57 ng/L — ABNORMAL HIGH (ref <18)

## 2019-04-04 LAB — PREPARE RBC (CROSSMATCH)

## 2019-04-04 MED ORDER — ASPIRIN EC 81 MG PO TBEC
81.0000 mg | DELAYED_RELEASE_TABLET | Freq: Every day | ORAL | Status: DC
  Administered 2019-04-05: 81 mg via ORAL
  Filled 2019-04-04 (×3): qty 1

## 2019-04-04 MED ORDER — ATORVASTATIN CALCIUM 40 MG PO TABS
80.0000 mg | ORAL_TABLET | Freq: Every evening | ORAL | Status: DC
  Filled 2019-04-04: qty 2

## 2019-04-04 MED ORDER — FAMOTIDINE 20 MG PO TABS: 20.0000 mg | ORAL_TABLET | Freq: Every day | ORAL | Status: DC

## 2019-04-04 MED ORDER — MORPHINE SULFATE (PF) 2 MG/ML IV SOLN
1.0000 mg | Freq: Once | INTRAVENOUS | Status: AC
  Administered 2019-04-04: 1 mg via INTRAVENOUS
  Filled 2019-04-04: qty 1

## 2019-04-04 MED ORDER — HYDRALAZINE HCL 20 MG/ML IJ SOLN
5.0000 mg | Freq: Four times a day (QID) | INTRAMUSCULAR | Status: DC | PRN
  Administered 2019-04-04: 5 mg via INTRAVENOUS
  Filled 2019-04-04: qty 1

## 2019-04-04 MED ORDER — HYDRALAZINE HCL 20 MG/ML IJ SOLN
5.0000 mg | Freq: Once | INTRAMUSCULAR | Status: AC
  Administered 2019-04-04: 5 mg via INTRAVENOUS
  Filled 2019-04-04: qty 1

## 2019-04-04 MED ORDER — TRAMADOL HCL 50 MG PO TABS
50.0000 mg | ORAL_TABLET | Freq: Four times a day (QID) | ORAL | Status: DC | PRN
  Administered 2019-04-05: 100 mg via ORAL
  Filled 2019-04-04: qty 2

## 2019-04-04 MED ORDER — SODIUM CHLORIDE 0.9% IV SOLUTION: Freq: Once | INTRAVENOUS | Status: DC

## 2019-04-04 MED ORDER — FERROUS SULFATE 300 (60 FE) MG/5ML PO SYRP
220.0000 mg | ORAL_SOLUTION | Freq: Every day | ORAL | Status: DC
  Administered 2019-04-04 – 2019-04-05 (×2): 220 mg
  Filled 2019-04-04 (×2): qty 5

## 2019-04-04 MED ORDER — NITROGLYCERIN 0.4 MG SL SUBL
0.4000 mg | SUBLINGUAL_TABLET | SUBLINGUAL | Status: DC | PRN
  Administered 2019-04-04 (×2): 0.4 mg via SUBLINGUAL
  Filled 2019-04-04: qty 1

## 2019-04-04 NOTE — Progress Notes (Signed)
PROGRESS NOTE  BRIDGER PIZZI FXT:[id number] DOB: 07/21/1960 DOA: 04/01/2019 PCP: [name], MD  HPI/Recap of past 24 hours: [name] is a 59 y.o. male with medical history significant for malignant neoplasm of supraglottis post tracheostomy, dysphagia status post PEG tube placement, coronary artery disease status post PCI with stenting in 2014, CVA, chronic lower back pain, chronic anxiety/depression, essential hypertension, hyperlipidemia who presented to [facility] ED sent from his oncologist office after passing out.  He was receiving radiation therapy, felt lightheaded and passed out.  This has happened in the past, received IV fluid with improvement.  States he was in his usual state of health prior to this.  He drove himself to radiation today.  Reports more frequent dark watery stools.  Denies nausea or abdominal pain.  Denies use of NSAIDs, states he used Tylenol for his arthritis and lower back pain.  No prior colonoscopy.  On presentation to the ED hypotensive and tachycardic.  Work-up revealed drop in hemoglobin down to 6.4.  Positive FOBT.  GI called for consult.  TRH asked to admit.  Transfused 1U PRBC.  Seen by GI declined EGD, denies melena or hematochezia, GI signed off.   Chest discomfort 3/31 AM.  Trop peaked at 468 and trended down.  No evidence of acute ischemia on 12 lead EKG.   Seen by cardiology, 2D echo completed.  No evidence of acute ischemia, no plan for cardiac procedure, cardiology signed off.  04/04/19:  No complaints.  Had a bowel movement today, brown stools.  He denies melena or hematochezia.  Drop in Hg this AM 7.4.  Will transfuse 1U PRBC.  Planned radiation today.  Will closely monitor for recurrent symptomatology post radiation.  Anticipate dc to home tomorrow.  TOC assisting with discharge/home health needs.    Assessment/Plan: Active Problems:   GI bleed   Malnutrition of moderate degree  Presumed upper GI bleed Decline EGD on 04/02/19 Worsening anemia  suspect multifactorial Presented with hemoglobin of 6.4 and positive FOBT Denies abdominal pain or nausea. Transfused 1 unit PRBC Hg 8.4 from 6.4 after 1U PRBC On IV PPI twice daily Hg drop 7.4 Transfuse 1U PRBC 4/2. Repeat H&H post transfusion  Malignant neoplasm of supraglottis Follows with oncology and radiation oncology outpatient Planned radiation at 3:30PM 04/04/19. Closely monitor for recurrent symptomatology post radiation  Elevated troponin suspect demand ischemia in the setting of severe anemia and hypotension Trop S Peaked at 468 and trending down  No evidence of acute ischemia on 12 lead EKG. Seen by cardiology, suspicion for demand ischemia from anemia and hypotension, signed off. 2D echo completed.  Preserved LVEF 55-60%, G1DD.  Resolved Diarrhea in the setting of gastric tube feeding  Dysphagia status post PEG tube placement Dietitian consult to initiate PEG tube feeding  Passed swallow eval Follow speech recs  Anemia of chronic disease in the setting of malignancy Iron studies suggestive of iron defficiency Received 1 U PRBC Drop Hg 7.8; denies melena or hematochezia Suspect related to his malignancy  Resolved Hypokalemia Presented with potassium of 2.8>> 5.1.  Repleted Magnesium level 2.1 on 03/27/2019  Syncope, suspect in the setting of dehydration Twelve-lead EKG no evidence of acute ischemia Obtain orthostatic vital signs PT rec HHPT Fall precautions  Chronic anxiety/depression Continue home medication  Recent right lower lobe pneumonia Diagnosed 2 weeks ago States he completed his oral abx on 04/01/19. No evidence of active infective process O2 sat 96% on room air   DVT prophylaxis: SCDs; chemical DVT  prophylaxis contraindicated in the setting of suspected GI bleed  Code Status: Full code as stated by the patient himself  Family Communication: Updated the patient's wife at bedside.  Disposition Plan:  Patient is from home.   Anticipate discharge to home tomorrow 04/05/19  Consults called: GI consulted by EDP, cardiology.    Objective: Vitals:   04/04/19 0859 04/04/19 0925 04/04/19 1156 04/04/19 1312  BP: 137/83 (!) 159/99  (!) 173/97  Pulse: 73 77  73  Resp:  16  20  Temp: 97.6 F (36.4 C) 97.6 F (36.4 C)  98.2 F (36.8 C)  TempSrc: Oral Oral  Oral  SpO2: 92% 92% 96% 92%  Weight:      Height:        Intake/Output Summary (Last 24 hours) at 04/04/2019 1328 Last data filed at 04/04/2019 1250 Gross per 24 hour  Intake 1207 ml  Output 375 ml  Net 832 ml   Filed Weights   04/01/19 1423  Weight: 76 kg    Exam:  . General: 59 y.o. year-old male well-developed well-nourished in no acute distress.  Alert and oriented x3. . Cardiovascular: Regular rate and rhythm no rubs or gallops.   Marland Kitchen Respiratory: Clear auscultation no wheezes or rales. . Abdomen: Soft nontender normal bowel sounds present.  PEG tube in place.   . Musculoskeletal: No lower extremity edema bilaterally.   Marland Kitchen Psychiatry: Mood is appropriate for condition and setting.   Data Reviewed: CBC: Recent Labs  Lab 04/01/19 1440 04/02/19 0437 04/03/19 0542 04/04/19 0454  WBC 3.2* 3.3* 3.9* 3.7*  NEUTROABS 2.3  --   --   --   HGB 6.4* 8.4* 7.8* 7.4*  HCT 20.3* 25.8*  25.0* 23.6* 22.6*  MCV 98.5 95.2 97.9 96.6  PLT 144* 129* 131* 675   Basic Metabolic Panel: Recent Labs  Lab 04/01/19 1440 04/02/19 0437 04/03/19 0542 04/04/19 0454  NA 136 133* 132* 132*  K 2.8* 5.1 4.4 4.0  CL 108 104 101 99  CO2 19* _0 GLUCOSE 126* 118* 114* 110*  BUN 14 16 22* 21*  CREATININE 0.72 0.90 0.81 0.70  CALCIUM 6.5* 8.2* 8.2* 7.9*  MG  --  1.6*  --   --   PHOS  --  2.6  --   --    GFR: Estimated Creatinine Clearance: 109.5 mL/min (by C-G formula based on SCr of 0.7 mg/dL). Liver Function Tests: Recent Labs  Lab 04/01/19 1440  AST 23  ALT 24  ALKPHOS 41  BILITOT 0.4  PROT 4.9*  ALBUMIN 2.5*   No results for input(s):  LIPASE, AMYLASE in the last 168 hours. No results for input(s): AMMONIA in the last 168 hours. Coagulation Profile: No results for input(s): INR, PROTIME in the last 168 hours. Cardiac Enzymes: No results for input(s): CKTOTAL, CKMB, CKMBINDEX, TROPONINI in the last 168 hours. BNP (last 3 results) No results for input(s): PROBNP in the last 8760 hours. HbA1C: No results for input(s): HGBA1C in the last 72 hours. CBG: No results for input(s): GLUCAP in the last 168 hours. Lipid Profile: No results for input(s): CHOL, HDL, LDLCALC, TRIG, CHOLHDL, LDLDIRECT in the last 72 hours. Thyroid Function Tests: No results for input(s): TSH, T4TOTAL, FREET4, T3FREE, THYROIDAB in the last 72 hours. Anemia Panel: Recent Labs    04/02/19 0437  VITAMINB12 573  FERRITIN 626*  TIBC 288  IRON 36*  RETICCTPCT 0.7   Urine analysis:    Component Value Date/Time   COLORURINE  YELLOW 03/17/2019 1600   APPEARANCEUR CLEAR 03/17/2019 1600   LABSPEC 1.021 03/17/2019 1600   PHURINE 6.0 03/17/2019 1600   GLUCOSEU NEGATIVE 03/17/2019 1600   HGBUR NEGATIVE 03/17/2019 1600   BILIRUBINUR NEGATIVE 03/17/2019 1600   KETONESUR NEGATIVE 03/17/2019 1600   PROTEINUR 30 (A) 03/17/2019 1600   NITRITE NEGATIVE 03/17/2019 1600   LEUKOCYTESUR NEGATIVE 03/17/2019 1600   Sepsis Labs: _0 (procalcitonin:4,lacticidven:4)  ) Recent Results (from the past 240 hour(s))  SARS CORONAVIRUS 2 (TAT 6-24 HRS) Nasopharyngeal Nasopharyngeal Swab     Status: None   Collection Time: 04/01/19  6:13 PM   Specimen: Nasopharyngeal Swab  Result Value Ref Range Status   SARS Coronavirus 2 NEGATIVE NEGATIVE Final    Comment: (NOTE) SARS-CoV-2 target nucleic acids are NOT DETECTED. The SARS-CoV-2 RNA is generally detectable in upper and lower respiratory specimens during the acute phase of infection. Negative results do not preclude SARS-CoV-2 infection, do not rule out co-infections with other pathogens, and should not be  used as the sole basis for treatment or other patient management decisions. Negative results must be combined with clinical observations, patient history, and epidemiological information. The expected result is Negative. Fact Sheet for Patients: SugarRoll.be Fact Sheet for Healthcare Providers: https://www.woods-mathews.com/ This test is not yet approved or cleared by the [place] FDA and  has been authorized for detection and/or diagnosis of SARS-CoV-2 by FDA under an Emergency Use Authorization (EUA). This EUA will remain  in effect (meaning this test can be used) for the duration of the COVID-19 declaration under Section 56 4(b)(1) of the Act, 21 U.S.C. section 360bbb-3(b)(1), unless the authorization is terminated or revoked sooner. Performed at [facility], [place] [address], [place], [place] [address]       Studies: ECHOCARDIOGRAM COMPLETE  Result Date: 04/03/2019    ECHOCARDIOGRAM REPORT   Patient Name:   GUST EUGENE Date of Exam: 04/03/2019 Medical Rec #:  [id number]     Height:       72.0 in Accession #:    [id number]    Weight:       167.5 lb Date of Birth:  [birth year]     BSA:          1.976 m Patient Age:    72 years      BP:           130/92 mmHg Patient Gender: M             HR:           74 bpm. Exam Location:  Inpatient Procedure: 2D Echo, Cardiac Doppler and Color Doppler Indications:    R55 Syncope  History:        Patient has prior history of Echocardiogram examinations, most                 recent 08/01/2018. CAD, Stroke; Risk Factors:Hypertension,                 Dyslipidemia and GERD. Cancer.  Sonographer:    Tiffany Dance Referring Phys: [id number] [place]  1. Normal LV systolic function; grade 1 diastolic dysfunction; mild LVH; moderately to severely dilated aortic root; suggest CTA or MRA to further assess; trace AI.  2. Left ventricular ejection fraction, by estimation, is 55 to 60%. The left ventricle  has normal function. The left ventricle has no regional wall motion abnormalities. There is mild left ventricular hypertrophy. Left ventricular diastolic parameters are consistent with Grade I diastolic dysfunction (  impaired relaxation).  3. Right ventricular systolic function is normal. The right ventricular size is normal. There is mildly elevated pulmonary artery systolic pressure.  4. The mitral valve is normal in structure. Trivial mitral valve regurgitation. No evidence of mitral stenosis.  5. The aortic valve is tricuspid. Aortic valve regurgitation is trivial. No aortic stenosis is present.  6. Aortic dilatation noted. There is moderate to severe dilatation of the aortic root measuring 48 mm.  7. The inferior vena cava is dilated in size with >50% respiratory variability, suggesting right atrial pressure of 8 mmHg. FINDINGS  Left Ventricle: Left ventricular ejection fraction, by estimation, is 55 to 60%. The left ventricle has normal function. The left ventricle has no regional wall motion abnormalities. The left ventricular internal cavity size was normal in size. There is  mild left ventricular hypertrophy. Left ventricular diastolic parameters are consistent with Grade I diastolic dysfunction (impaired relaxation). Right Ventricle: The right ventricular size is normal. Right ventricular systolic function is normal. There is mildly elevated pulmonary artery systolic pressure. The tricuspid regurgitant velocity is 2.52 m/s, and with an assumed right atrial pressure of 8 mmHg, the estimated right ventricular systolic pressure is 96.2 mmHg. Left Atrium: Left atrial size was normal in size. Right Atrium: Right atrial size was normal in size. Pericardium: There is no evidence of pericardial effusion. Mitral Valve: The mitral valve is normal in structure. Normal mobility of the mitral valve leaflets. Trivial mitral valve regurgitation. No evidence of mitral valve stenosis. Tricuspid Valve: The tricuspid valve is  normal in structure. Tricuspid valve regurgitation is mild . No evidence of tricuspid stenosis. Aortic Valve: The aortic valve is tricuspid. Aortic valve regurgitation is trivial. Aortic regurgitation PHT measures 400 msec. No aortic stenosis is present. Pulmonic Valve: The pulmonic valve was normal in structure. Pulmonic valve regurgitation is trivial. No evidence of pulmonic stenosis. Aorta: The aortic root is normal in size and structure and aortic dilatation noted. There is moderate to severe dilatation of the aortic root measuring 48 mm. Venous: The inferior vena cava is dilated in size with greater than 50% respiratory variability, suggesting right atrial pressure of 8 mmHg. IAS/Shunts: No atrial level shunt detected by color flow Doppler. Additional Comments: Normal LV systolic function; grade 1 diastolic dysfunction; mild LVH; moderately to severely dilated aortic root; suggest CTA or MRA to further assess; trace AI.  LEFT VENTRICLE PLAX 2D LVIDd:         4.10 cm  Diastology LVIDs:         2.60 cm  LV e' lateral:   7.83 cm/s LV PW:         1.50 cm  LV E/e' lateral: 7.1 LV IVS:        1.30 cm  LV e' medial:    6.74 cm/s LVOT diam:     2.30 cm  LV E/e' medial:  8.2 LV SV:         72 LV SV Index:   36 LVOT Area:     4.15 cm  RIGHT VENTRICLE             IVC RV Basal diam:  2.80 cm     IVC diam: 2.40 cm RV S prime:     10.00 cm/s TAPSE (M-mode): 1.6 cm LEFT ATRIUM             Index       RIGHT ATRIUM           Index LA diam:  4.30 cm 2.18 cm/m  RA Area:     15.30 cm LA Vol (A2C):   50.6 ml 25.61 ml/m RA Volume:   36.70 ml  18.57 ml/m LA Vol (A4C):   36.2 ml 18.32 ml/m LA Biplane Vol: 44.3 ml 22.42 ml/m  AORTIC VALVE LVOT Vmax:   103.00 cm/s LVOT Vmean:  57.200 cm/s LVOT VTI:    0.173 m AI PHT:      400 msec  AORTA Ao Root diam: 4.80 cm Ao Asc diam:  3.40 cm MITRAL VALVE               TRICUSPID VALVE MV Area (PHT): 2.91 cm    TR Peak grad:   25.4 mmHg MV Decel Time: 261 msec    TR Vmax:        252.00  cm/s MV E velocity: 55.50 cm/s MV A velocity: 74.40 cm/s  SHUNTS MV E/A ratio:  0.75        Systemic VTI:  0.17 m                            Systemic Diam: 2.30 cm Kirk Ruths MD Electronically signed by Kirk Ruths MD Signature Date/Time: 04/03/2019/3:11:19 PM    Final     Scheduled Meds: . sodium chloride   Intravenous Once  . Chlorhexidine Gluconate Cloth  6 each Topical Daily  . DULoxetine  30 mg Oral Daily  . feeding supplement (OSMOLITE 1.5 CAL)  237 mL Per Tube 5 X Daily  . feeding supplement (PRO-STAT SUGAR FREE 64)  30 mL Per Tube TID  . ferrous sulfate  220 mg Per Tube Q breakfast  . free water  100 mL Per Tube 5 X Daily  . metoprolol tartrate  12.5 mg Per Tube BID  . pantoprazole sodium  40 mg Per Tube BID  . Tracheostomy Care  1 kit Does not apply Daily    Continuous Infusions:    LOS: 3 days     Kayleen Memos, MD [facility] Pager [phone]  If 7PM-7AM, please contact night-coverage [email or web address] Password [email or web address] 04/04/2019, 1:28 PM

## 2019-04-04 NOTE — Progress Notes (Signed)
Physical Therapy Treatment Patient Details Name: [name] MRN: [id number] DOB: 04-Nov-1960 Today's Date: 04/04/2019    History of Present Illness Pt is a 59 y.o. male who presented to [facility] on 02/18/19 for elective multiple dental extractions prior to initiating chemo and radiation therapy to newly diagnosed supraglottic posterior laryngeal squamous cell carcinoma.  During induction, airway was difficult and trach ultimately placed for airway protection. S/p G tube placement 2/22. PMH includes anxiety, carotid artery stenosis, chronic low back pain, CVA (2020).    PT Comments    Patient making steady progress with therapy and agreeable to participate in gait training with SPC. Cues required intermittently for sequencing cane position and to make contact with ground for support. Pt on RA during session and desaturated to 89% after ambulating ~50' but increased >92% after standing break and cues for breathing. Pt's saturation at EOS while sitting in chair 98%. Pt agreeable to sit up at first but then declined and requested to return to bed, min guard provided. Pt will continue to benefit from skilled PT interventions with HHPT follow up. Acute will progress as able.     Follow Up Recommendations  Home health PT     Equipment Recommendations  None recommended by PT(pt has RW and SPC at home)    Recommendations for Other Services       Precautions / Restrictions Precautions Precautions: Fall Restrictions Weight Bearing Restrictions: No    Mobility  Bed Mobility Overal bed mobility: Needs Assistance Bed Mobility: Supine to Sit;Sit to Supine     Supine to sit: Supervision;HOB elevated Sit to supine: Supervision   General bed mobility comments: no assist needed, pt requires increased time  Transfers Overall transfer level: Needs assistance Equipment used: 1 person hand held assist;None Transfers: Sit to/from Stand Sit to Stand: Min guard;Min assist      General transfer  comment: min guard assist for safety, no overt LOB on rising from EOB or toilet.  Ambulation/Gait Ambulation/Gait assistance: Min guard Gait Distance (Feet): 120 Feet(2x10, 1x 100) Assistive device: Straight cane Gait Pattern/deviations: Step-through pattern Gait velocity: fair   General Gait Details: mild deficits observed. pt on RA and saturating at 98% or greater for majority of gait. Desaturated to 89% momentarily in hallway and improved with standing rest break. At end of gait pt SpO2 back up to 98%.   Stairs      Wheelchair Mobility    Modified Rankin (Stroke Patients Only)       Balance Overall balance assessment: Needs assistance Sitting-balance support: Feet supported Sitting balance-Leahy Scale: Good     Standing balance support: Single extremity supported;During functional activity Standing balance-Leahy Scale: Fair Standing balance comment: pt performed self care/pericare in standing with 1 UE support on grab bar               Cognition Arousal/Alertness: Awake/alert Behavior During Therapy: WFL for tasks assessed/performed Overall Cognitive Status: Within Functional Limits for tasks assessed              Exercises      General Comments        Pertinent Vitals/Pain Pain Assessment: No/denies pain           PT Goals (current goals can now be found in the care plan section) Acute Rehab PT Goals Patient Stated Goal: to get home and back to fishing PT Goal Formulation: With patient Time For Goal Achievement: 04/17/19 Potential to Achieve Goals: Good Progress towards PT goals: Progressing toward goals  Frequency    Min 3X/week      PT Plan Current plan remains appropriate       AM-PAC PT "6 Clicks" Mobility   Outcome Measure  Help needed turning from your back to your side while in a flat bed without using bedrails?: None Help needed moving from lying on your back to sitting on the side of a flat bed without using bedrails?:  A Little Help needed moving to and from a bed to a chair (including a wheelchair)?: A Little Help needed standing up from a chair using your arms (e.g., wheelchair or bedside chair)?: A Little Help needed to walk in hospital room?: A Little Help needed climbing 3-5 steps with a railing? : A Little 6 Click Score: 19    End of Session Equipment Utilized During Treatment: Gait belt;Oxygen Activity Tolerance: Patient tolerated treatment well Patient left: with bed alarm set;with call bell/phone within reach;with family/visitor present;in chair;in bed Nurse Communication: Mobility status PT Visit Diagnosis: Muscle weakness (generalized) (M62.81);Other abnormalities of gait and mobility (R26.89);Difficulty in walking, not elsewhere classified (R26.2)     Time: 9021-1155 PT Time Calculation (min) (ACUTE ONLY): 24 min  Charges:  $Gait Training: 8-22 mins $Therapeutic Activity: 8-22 mins                    Verner Mould, DPT Physical Therapist with [facility] [phone]  04/04/2019 5:12 PM

## 2019-04-04 NOTE — Progress Notes (Signed)
Patient c/o headache and chest tightness but does not feel crushing or radiating. Dr. Nevada Crane made aware. EKG obtained. See MAR for pain medications given. Nitroglycerin given x2. Patient is feeling "somewhat better." BP 150/90 which is improved from beginning of symptoms. Report given to [facility]. Wife at bedside and updated.

## 2019-04-04 NOTE — Progress Notes (Signed)
  Speech Language Pathology Treatment: Dysphagia  Patient Details Name: [name] MRN: [id number] DOB: 07-Oct-1960 Today's Date: 04/04/2019 Time: 2836-6294 SLP Time Calculation (min) (ACUTE ONLY): 11 min  Assessment / Plan / Recommendation Clinical Impression  SLP provided exercises and stretches, demonstrated for pt and provided in writing. Did not ask for return demonstration due to pt's discomfort/recent chest pain s/p nitro and morphine.  SLP emphasized need to continue po intake - at least water during treatment to mitigate fibrosis.  In addition,  measured patient's max jaw opening which is currently 30 mm - normal being 49-56.  Encouraged massage, stretch, lingual press, effortful swallow, pitch glide and jaw opening against hand resistance to maximize his swallow rehab.  Advised pt to initiate exercises this weekend if he can tolerate.  Pt's wife advised SLP she would help pt with exercises when he was discharged home - as she was leaving when SLP arrived.  Advised wife that SLP would leave exercises for pt and tips for radiation mucositis and esophagitis compensations. Session was discontinued after review of exercises and measurement due to pt's earlier chest pain and current sleepiness.  Pt agreeable to plan for SLP to follow up Monday April 5th if pt remains in hospital. If pt is to dc, please order [facility] SLP as he reports he has not seen an SLP for evaluation/tx.  Pt declined XRT today due to discomfort.    HPI HPI: 59 yo male with recent diagnosis of subglottic cancer - He is undergoing XRT and chemo stated 2 weeks ago.  Pt admitted with syncope - concern for GI bleed.  It was determined that he did not want endoscopy and hemopytsis was likely due to subglottis tumor.  Pt has a PEG and trach and uses a PMSV.  Recent pna 3/15 = right lower lobe . Pt reports he has been eating pudding/chicken noodle soup.  He denies dysphagia.      SLP Plan  Continue with current plan of care        Recommendations  Diet recommendations: Other(comment)(pt only drinking liquids, reports he is not consuming anything from kitchen) Medication Administration: Whole meds with liquid Compensations: Slow rate;Small sips/bites Postural Changes and/or Swallow Maneuvers: Seated upright 90 degrees;Upright 30-60 min after meal                Oral Care Recommendations: Oral care BID SLP Visit Diagnosis: Dysphagia, pharyngeal phase (R13.13) Plan: Continue with current plan of care       GO           Kathleen Lime, MS [place] Office [phone]   Macario Golds 04/04/2019, 5:05 PM

## 2019-04-04 NOTE — TOC Initial Note (Signed)
Transition of Care [facility]) - Initial/Assessment Note    Patient Details  Name: [name] MRN: [id number] Date of Birth: Mar 16, 1960  Transition of [facility] CM/SW Contact:    Ross Ludwig, LCSW Phone Number: 04/04/2019, 11:20 AM  Clinical Narrative:                  Patient is a 59 year old male who is alert and oriented x4.  Patient is married, and lives with his wife.  CSW attempted to call wife, and left a message on her voice mail.  CSW completed assessment by reviewing chart.   Patient appeared to be sleping CSW did not disturb him, Patient's insurance is in network with [facility], [place] spoke to [place] and they can accept patient once he is medically ready for discharge.                                                                                                                                                                 Expected Discharge Plan: [place] Barriers to Discharge: Continued Medical Work up   Patient Goals and CMS Choice Patient states their goals for this hospitalization and ongoing recovery are:: To return back home with home health.      Expected Discharge Plan and Services Expected Discharge Plan: [place]   Discharge Planning Services: NA Post Acute Care Choice: [place] arrangements for the past 2 months: Single Family Home                   DME Agency: NA       HH Arranged: PT, RN HH Agency: [place] Date [place]: 04/03/19 Time HH Agency Contacted: 1400 Representative spoke with at [place]: Malachy Mood  Prior Living Arrangements/Services Living arrangements for the past 2 months: [place] with:: Spouse Patient language and need for interpreter reviewed:: Yes Do you feel safe going back to the place where you live?: Yes      Need for Family Participation in Patient Care: Yes (Comment) Care giver support system in place?: Yes (comment)    Criminal Activity/Legal Involvement Pertinent to Current Situation/Hospitalization: No - Comment as needed  Activities of Daily Living Home Assistive Devices/Equipment: Cane (specify quad or straight) ADL Screening (condition at time of admission) Patient's cognitive ability adequate to safely complete daily activities?: Yes Is the patient deaf or have difficulty hearing?: No Does the patient have difficulty seeing, even when wearing glasses/contacts?: No Does the patient have difficulty concentrating, remembering, or making decisions?: No Patient able to express need for assistance with ADLs?: Yes Does the patient have difficulty dressing or bathing?: No Independently performs ADLs?: Yes (appropriate for developmental age) Does the patient  have difficulty walking or climbing stairs?: Yes Weakness of Legs: Both Weakness of Arms/Hands: None  Permission Sought/Granted Permission sought to share information with : Family Supports Permission granted to share information with : Yes, Verbal Permission Granted  Share Information with NAME: Home Health Agency           Emotional Assessment Appearance:: Appears stated age   Affect (typically observed): Accepting, Stable, Calm Orientation: : Oriented to Self, Oriented to Place, Oriented to  Time, Oriented to Situation Alcohol / Substance Use: Not Applicable Psych Involvement: No (comment)  Admission diagnosis:  GI bleed [K92.2] Upper GI bleed [K92.2] Patient Active Problem List   Diagnosis Date Noted  . Malnutrition of moderate degree 04/03/2019  . GI bleed 04/01/2019  . Dehydration 03/28/2019  . RLL pneumonia 03/17/2019  . Hx of tracheostomy 02/18/2019  . Head and neck cancer ([place]) 02/18/2019  . 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 Stroke ([place]) 07/31/2018  . Rotator cuff syndrome of right shoulder 02/20/2013  . Bradycardia 11/06/2012  . Coronary artery disease   . Abnormal finding on cardiovascular stress  test 09/25/2012  . Hypertension 09/03/2012  . Chest pain 09/03/2012  . Alcohol abuse, daily use 09/03/2012  . Hyperlipidemia 09/03/2012  . Hyponatremia 09/03/2012   PCP:  [name], MD Pharmacy:   [facility], [place]. [place] [address] Phone: [phone] Fax: [phone]  [facility], [address] [place] [address] Phone: [phone] Fax: [phone]     Social Determinants of Health (SDOH) Interventions    Readmission Risk Interventions No flowsheet data found.

## 2019-04-05 LAB — BASIC METABOLIC PANEL
Anion gap: 9 (ref 5–15)
BUN: 13 mg/dL (ref 6–20)
CO2: 26 mmol/L (ref 22–32)
Calcium: 8.3 mg/dL — ABNORMAL LOW (ref 8.9–10.3)
Chloride: 95 mmol/L — ABNORMAL LOW (ref 98–111)
Creatinine, Ser: 0.69 mg/dL (ref 0.61–1.24)
GFR calc Af Amer: >60 mL/min (ref >60)
GFR calc non Af Amer: >60 mL/min (ref >60)
Glucose, Bld: 92 mg/dL (ref 70–99)
Potassium: 3.4 mmol/L — ABNORMAL LOW (ref 3.5–5.1)
Sodium: 130 mmol/L — ABNORMAL LOW (ref 135–145)

## 2019-04-05 LAB — CBC
HCT: 27.8 % — ABNORMAL LOW (ref 39.0–52.0)
Hemoglobin: 9.2 g/dL — ABNORMAL LOW (ref 13.0–17.0)
MCH: 31.4 pg (ref 26.0–34.0)
MCHC: 33.1 g/dL (ref 30.0–36.0)
MCV: 94.9 fL (ref 80.0–100.0)
Platelets: 183 10*3/uL (ref 150–400)
RBC: 2.93 MIL/uL — ABNORMAL LOW (ref 4.22–5.81)
RDW: 14.9 % (ref 11.5–15.5)
WBC: 5.7 10*3/uL (ref 4.0–10.5)
nRBC: 0 % (ref 0.0–0.2)

## 2019-04-05 LAB — TYPE AND SCREEN
ABO/RH(D): A POS
Antibody Screen: NEGATIVE
Unit division: 0

## 2019-04-05 LAB — BPAM RBC
Blood Product Expiration Date: 202104182359
ISSUE DATE / TIME: 202104020904
Unit Type and Rh: 6200

## 2019-04-05 MED ORDER — HEPARIN SOD (PORK) LOCK FLUSH 100 UNIT/ML IV SOLN
250.0000 [IU] | INTRAVENOUS | Status: AC | PRN
  Administered 2019-04-05: 250 [IU]
  Filled 2019-04-05: qty 2.5

## 2019-04-05 MED ORDER — DULOXETINE HCL 30 MG PO CPEP: 30.0000 mg | ORAL_CAPSULE | Freq: Every day | ORAL | 0 refills | Status: DC

## 2019-04-05 MED ORDER — POTASSIUM CHLORIDE 20 MEQ/15ML (10%) PO SOLN: 20.0000 meq | Freq: Every day | ORAL | 0 refills | Status: DC

## 2019-04-05 MED ORDER — MAGNESIUM OXIDE -MG SUPPLEMENT 400 MG PO CAPS: 400.0000 mg | ORAL_CAPSULE | Freq: Every day | ORAL | 0 refills | Status: DC

## 2019-04-05 MED ORDER — FERROUS SULFATE 300 (60 FE) MG/5ML PO SYRP: 220.0000 mg | ORAL_SOLUTION | Freq: Every day | ORAL | 0 refills | Status: DC

## 2019-04-05 MED ORDER — POTASSIUM CHLORIDE 20 MEQ/15ML (10%) PO SOLN: 40.0000 meq | Freq: Two times a day (BID) | ORAL | Status: DC

## 2019-04-05 NOTE — Discharge Summary (Signed)
Discharge Summary  [name]:[id number] DOB: [birth year]  PCP: [name], MD  Admit date: 04/01/2019 Discharge date: 04/05/2019  Time spent: 35 minutes  Recommendations for Outpatient Follow-up:  1. Follow up with oncology 2. Follow up with GI 3. Follow up with your PCP 4. Take your medications as prescribed.   Discharge Diagnoses:  Active Hospital Problems   Diagnosis Date Noted  . Malnutrition of moderate degree 04/03/2019  . GI bleed 04/01/2019    Resolved Hospital Problems  No resolved problems to display.    Discharge Condition: Stable   Diet recommendation: Resume previous diet; aspirations precautions.   Vitals:   04/05/19 0827 04/05/19 1207  BP:    Pulse: 82 80  Resp:    Temp:    SpO2: 92% 93%    History of present illness:  [name] a 59 y.o.malewith medical history significant formalignant neoplasm of supraglottis post tracheostomy, dysphagia status post PEG tube placement, coronary artery disease status post PCI with stentingin 2014, CVA, chronic lower back pain, chronic anxiety/depression, essential hypertension, hyperlipidemia who presented to [name] from his oncologist office after passing out.He was receiving radiation therapy, feltlightheaded and passed out. This has happened in the past,received IV fluid with improvement. States he was in his usual state of health prior to this. He drovehimself to radiation. Reports more frequent watery stools.  Denies use of NSAIDs, states he uses Tylenol for his arthritis and lower back pain. No prior colonoscopy. On presentation to the ED hypotensive and tachycardic. Work-up revealed drop in hemoglobin down to 6.4. Positive FOBT. GI called for consult. TRH asked to admit.  Transfused 1U PRBC.  Seen by GI declined EGD, denies melena or hematochezia, GI signed off.   Chest discomfort 3/31 AM.  Trop peaked at 468 and trended down.  No evidence of acute ischemia on 12 lead EKG.   Seen  by cardiology, 2D echo completed.  No evidence of acute ischemia, no plan for cardiac procedure, cardiology signed off.   Chest tightness on 04/04/19, no evidence of acute ischemia on 12 lead EKG, Trop S down to 57 from 415.  Symptoms resolved after SL nitro x 2 doses.  Missed radiation appointment.   04/05/19, reports some nausea, states it comes from the saline water used to flush his peg tube.  Improved with antiemetics.  On antiemetic at home.  Hospital course complicated by episodes of non compliance with medical management.  Declined EGD, radiation on 04/03/19, declined his medications last night.    Hospital Course:  Active Problems:   GI bleed   Malnutrition of moderate degree  Presumed upper GI bleed Declined EGD on 04/02/19 Presented with hemoglobin of 6.4 and positive FOBT, suspect multifactorial Transfused 2 unit PRBCs during this admission Hg 9.2, MCV 94  Malignant neoplasm of supraglottis Follows with oncology and radiation oncology outpatient Planned radiation at 3:30PM 04/04/19, missed as stated above. Declined radiation on 04/03/19 stating he prefers to have radiation 3 times a week instead of 5 times a week.  Elevated troponin suspect demand ischemia in the setting of severe anemia and hypotension Trop S Peaked at 468 and trended down to 57  No evidence of acute ischemia on 12 lead EKG. Seen by cardiology, suspicion for demand ischemia from anemia and hypotension, signed off. 2D echo completed.  Preserved LVEF 55-60%, G1DD. Follow up with your cardiologist  Resolved Diarrhea in the setting of gastric tube feeding  Dysphagia status post PEG tube placement Aspiration precautions.  Anemia of  chronic disease in the setting of malignancy Iron studies suggestive of iron defficiency Received 2 U PRBCs Denies melena or hematochezia Declined EGD Continue ferrous sulfate Follow up with oncology  Refractory Hypokalemia Presented with potassium of 2.8>> 5.1.   Repleted Magnesium level 2.1 on 03/27/2019>> 1.6 on 3/31 Replete daily x 5 days Follow up with your PCP  Syncope, suspect in the setting of dehydration Avoid dehydration Fall precautions Continue PT OT  Chronic anxiety/depression Continue home medications  Recent right lower lobe pneumonia Diagnosed 2 weeks ago States he completed his oral abx on 04/01/19. No evidence of active infective process O2 sat 96% on room air     Code Status:Full code as stated by the patient himself     Consults called:GI consulted by EDP, cardiology.      Discharge Exam: BP (!) 158/89 (BP Location: Left Arm)   Pulse 80   Temp 98.6 F (37 C) (Oral)   Resp 19   Ht 6' (1.829 m)   Wt 76 kg   SpO2 93%   BMI 22.72 kg/m  . General: 59 y.o. year-old male well developed well nourished in no acute distress.  Alert and oriented x3. Post tracheostomy. . Cardiovascular: Regular rate and rhythm with no rubs or gallops.  No thyromegaly or JVD noted.   Marland Kitchen Respiratory: Clear to auscultation with no wheezes or rales. Good inspiratory effort. . Abdomen: Soft nontender nondistended with normal bowel sounds x4 quadrants. Peg tube in place. . Musculoskeletal: No lower extremity edema. 2/4 pulses in all 4 extremities. Marland Kitchen Psychiatry: Mood is appropriate for condition and setting  Discharge Instructions You were cared for by a hospitalist during your hospital stay. If you have any questions about your discharge medications or the care you received while you were in the hospital after you are discharged, you can call the unit and asked to speak with the hospitalist on call if the hospitalist that took care of you is not available. Once you are discharged, your primary care physician will handle any further medical issues. Please note that NO REFILLS for any discharge medications will be authorized once you are discharged, as it is imperative that you return to your primary care physician (or establish a  relationship with a primary care physician if you do not have one) for your aftercare needs so that they can reassess your need for medications and monitor your lab values.   Allergies as of 04/05/2019      Reactions   Prednisone Other (See Comments)   Chest pain      Medication List    STOP taking these medications   clopidogrel 75 MG tablet Commonly known as: PLAVIX   naproxen 250 MG tablet Commonly known as: NAPROSYN     TAKE these medications   amLODipine 5 MG tablet Commonly known as: NORVASC Take 1 tablet (5 mg total) by mouth daily.   Argyle Suction Catheter 14FR Misc 1 Device by Does not apply route daily.   aspirin EC 81 MG tablet Take 81 mg by mouth daily.   atorvastatin 80 MG tablet Commonly known as: LIPITOR Take 1 tablet (80 mg total) by mouth every evening.   CISPLATIN IV Inject into the vein once a week. Weekly in conjunction with radiation therapy beginning 03/06/2019   DULoxetine 30 MG capsule Commonly known as: CYMBALTA Take 1 capsule (30 mg total) by mouth daily.   famotidine 20 MG tablet Commonly known as: PEPCID Take 20 mg by mouth at bedtime.  feeding supplement (OSMOLITE 1.5 CAL) Liqd Give 2 cartons of osmolite 1.5 at 9:30am, 1:30, 5:30 and 1 carton at 9:30pm.  Flush with 39m of water before and 1284mafter each feeding. Meets 100% of patient needs. Send bolus supplies. What changed:   how much to take  how to take this  when to take this   ProSource TF Liqd Give 3013m times per day via feeding tube.  Mix with 32m45m water.  Flush tube with 32ml2mwater, then place prosource and water mixture in tube and flush with 32ml 80mater after. What changed:   how much to take  how to take this  when to take this  additional instructions   ferrous sulfate 300 (60 Fe) MG/5ML syrup Place 3.7 mLs (220 mg total) into feeding tube daily with breakfast. Start taking on: April 06, 2019   HYDROcodone-acetaminophen 10-325 MG tablet Commonly  known as: NORCO Take 1 tablet by mouth every 4 (four) hours as needed. What changed: reasons to take this   lidocaine 2 % solution Commonly known as: XYLOCAINE Patient: Mix 1part 2% viscous lidocaine, 1part H20. Swish & swallow 10mL o62mluted mixture, 30min b61me meals and at bedtime, up to QID What changed:   how much to take  how to take this  when to take this  additional instructions   Magnesium Oxide -Mg Supplement 400 MG Caps Give 400 mg by tube daily for 5 days.   metoprolol tartrate 25 MG tablet Commonly known as: LOPRESSOR TAKE 1/2 TABLET BY MOUTH TWICE DAILY   Misc. Devices Misc 10 mL saline flushes - one box What changed: Another medication with the same name was removed. Continue taking this medication, and follow the directions you see here.   Misc. Devices Misc Osmolite 1.5 x 7 cartons daily 2 cartons at 8 am, 2 cartons at noon, 2 cartons at 4pm, 1 carton at 8pm Send bolus tube feeding supplies What changed: Another medication with the same name was removed. Continue taking this medication, and follow the directions you see here.   nitroGLYCERIN 0.4 MG SL tablet Commonly known as: NITROSTAT Place 1 tablet (0.4 mg total) under the tongue every 5 (five) minutes as needed for chest pain (CP or SOB).   omeprazole 20 MG capsule Commonly known as: PRILOSEC Take 20 mg by mouth daily.   potassium chloride 20 MEQ/15ML (10%) Soln Place 15 mLs (20 mEq total) into feeding tube daily for 5 days.   prochlorperazine 10 MG tablet Commonly known as: COMPAZINE TAKE 1 TABLET(10 MG) BY MOUTH EVERY 6 HOURS AS NEEDED FOR NAUSEA OR VOMITING What changed: See the new instructions.   sodium chloride 0.9 % infusion Use as directed to flush PICC line   sodium chloride irrigation 0.9 % irrigation Irrigate with 200 mLs as directed continuous.   Tracheostomy Care Kit 1 kit by Does not apply route daily.   traMADol 50 MG tablet Commonly known as: ULTRAM Take 50-100 mg by  mouth 4 (four) times daily as needed (pain.).      Allergies  Allergen Reactions  . Prednisone Other (See Comments)    Chest pain   Follow-up Information    [facility]   Why: agency will provide home health physical therapy and nurse. Contact information: [address] [phone], LRedmond Schoolll in 1 day(s).   Specialty: Internal Medicine Why: Please call for a post  hospital follow up appointment. Contact information: [address] [place] [address] [phone]        Herminio Commons, MD .   Specialty: Cardiology Contact information: [place] [address] [phone]        Eppie Gibson, MD. Call in 1 day(s).   Specialty: Radiation Oncology Why: Please call for a post hospital follow up appointment Contact information: [place] [place] [employer] [place] [address] [phone]        [name], MD. Call in 1 day(s).   Specialty: Hematology Why: Please call for a post hospital follow up appointment Contact information: [place] [address] [phone]        Frann Rider, NP. Call in 1 day(s).   Specialty: Neurology Why: Please call for a post hospital follow up appointment. Contact information: [place] [place] [address] [place] [address] [phone]        Butch Penny, NP .   Specialty: Internal Medicine       Gatha Mayer, MD. Call in 1 day(s).   Specialty: Gastroenterology Why: Please call for a post hospital follow up appointment. Contact information: [address] [place] [address] [phone]            The results of significant diagnostics from this hospitalization (including imaging, microbiology, ancillary and laboratory) are listed below for reference.    Significant Diagnostic Studies: DG Chest Port 1 View  Result Date: 04/01/2019 CLINICAL DATA:  Syncope EXAM: PORTABLE CHEST 1  VIEW COMPARISON:  03/17/2019 FINDINGS: Two frontal views of the chest demonstrates stable tracheostomy tube and right-sided PICC. Cardiac silhouette is unremarkable. No airspace disease, effusion, or pneumothorax. No acute bony abnormalities. IMPRESSION: 1. No acute intrathoracic process. Electronically Signed   By: Randa Ngo M.D.   On: 04/01/2019 15:04   DG Chest Portable 1 View  Result Date: 03/17/2019 CLINICAL DATA:  Fever. EXAM: PORTABLE CHEST 1 VIEW COMPARISON:  Radiograph 03/04/2019 FINDINGS: Tracheostomy tube tip at the thoracic inlet. Right upper extremity central line tip in the SVC. Streaky opacities at the right lung base are new from prior exam. Unchanged heart size and mediastinal contours. No pulmonary edema or pneumothorax. No large pleural effusion. No acute osseous abnormalities are seen. IMPRESSION: Streaky right lung base opacities, new from radiographs earlier this month, favoring atelectasis but could represent pneumonia in the setting of fever. Electronically Signed   By: Keith Rake M.D.   On: 03/17/2019 15:54   ECHOCARDIOGRAM COMPLETE  Result Date: 04/03/2019    ECHOCARDIOGRAM REPORT   Patient Name:   DARIYON URQUILLA Date of Exam: 04/03/2019 Medical Rec #:  [id number]     Height:       72.0 in Accession #:    [id number]    Weight:       167.5 lb Date of Birth:  1960-01-03     BSA:          1.976 m Patient Age:    59 years      BP:           130/92 mmHg Patient Gender: M             HR:           74 bpm. Exam Location:  Inpatient Procedure: 2D Echo, Cardiac Doppler and Color Doppler Indications:    R55 Syncope  History:        Patient has prior history of Echocardiogram examinations, most  recent 08/01/2018. CAD, Stroke; Risk Factors:Hypertension,                 Dyslipidemia and GERD. Cancer.  Sonographer:    Tiffany Dance Referring Phys: [id number] [place]  1. Normal LV systolic function; grade 1 diastolic dysfunction; mild LVH; moderately to  severely dilated aortic root; suggest CTA or MRA to further assess; trace AI.  2. Left ventricular ejection fraction, by estimation, is 55 to 60%. The left ventricle has normal function. The left ventricle has no regional wall motion abnormalities. There is mild left ventricular hypertrophy. Left ventricular diastolic parameters are consistent with Grade I diastolic dysfunction (impaired relaxation).  3. Right ventricular systolic function is normal. The right ventricular size is normal. There is mildly elevated pulmonary artery systolic pressure.  4. The mitral valve is normal in structure. Trivial mitral valve regurgitation. No evidence of mitral stenosis.  5. The aortic valve is tricuspid. Aortic valve regurgitation is trivial. No aortic stenosis is present.  6. Aortic dilatation noted. There is moderate to severe dilatation of the aortic root measuring 48 mm.  7. The inferior vena cava is dilated in size with >50% respiratory variability, suggesting right atrial pressure of 8 mmHg. FINDINGS  Left Ventricle: Left ventricular ejection fraction, by estimation, is 55 to 60%. The left ventricle has normal function. The left ventricle has no regional wall motion abnormalities. The left ventricular internal cavity size was normal in size. There is  mild left ventricular hypertrophy. Left ventricular diastolic parameters are consistent with Grade I diastolic dysfunction (impaired relaxation). Right Ventricle: The right ventricular size is normal. Right ventricular systolic function is normal. There is mildly elevated pulmonary artery systolic pressure. The tricuspid regurgitant velocity is 2.52 m/s, and with an assumed right atrial pressure of 8 mmHg, the estimated right ventricular systolic pressure is 33.3 mmHg. Left Atrium: Left atrial size was normal in size. Right Atrium: Right atrial size was normal in size. Pericardium: There is no evidence of pericardial effusion. Mitral Valve: The mitral valve is normal in  structure. Normal mobility of the mitral valve leaflets. Trivial mitral valve regurgitation. No evidence of mitral valve stenosis. Tricuspid Valve: The tricuspid valve is normal in structure. Tricuspid valve regurgitation is mild . No evidence of tricuspid stenosis. Aortic Valve: The aortic valve is tricuspid. Aortic valve regurgitation is trivial. Aortic regurgitation PHT measures 400 msec. No aortic stenosis is present. Pulmonic Valve: The pulmonic valve was normal in structure. Pulmonic valve regurgitation is trivial. No evidence of pulmonic stenosis. Aorta: The aortic root is normal in size and structure and aortic dilatation noted. There is moderate to severe dilatation of the aortic root measuring 48 mm. Venous: The inferior vena cava is dilated in size with greater than 50% respiratory variability, suggesting right atrial pressure of 8 mmHg. IAS/Shunts: No atrial level shunt detected by color flow Doppler. Additional Comments: Normal LV systolic function; grade 1 diastolic dysfunction; mild LVH; moderately to severely dilated aortic root; suggest CTA or MRA to further assess; trace AI.  LEFT VENTRICLE PLAX 2D LVIDd:         4.10 cm  Diastology LVIDs:         2.60 cm  LV e' lateral:   7.83 cm/s LV PW:         1.50 cm  LV E/e' lateral: 7.1 LV IVS:        1.30 cm  LV e' medial:    6.74 cm/s LVOT diam:     2.30 cm  LV E/e' medial:  8.2 LV SV:         72 LV SV Index:   36 LVOT Area:     4.15 cm  RIGHT VENTRICLE             IVC RV Basal diam:  2.80 cm     IVC diam: 2.40 cm RV S prime:     10.00 cm/s TAPSE (M-mode): 1.6 cm LEFT ATRIUM             Index       RIGHT ATRIUM           Index LA diam:        4.30 cm 2.18 cm/m  RA Area:     15.30 cm LA Vol (A2C):   50.6 ml 25.61 ml/m RA Volume:   36.70 ml  18.57 ml/m LA Vol (A4C):   36.2 ml 18.32 ml/m LA Biplane Vol: 44.3 ml 22.42 ml/m  AORTIC VALVE LVOT Vmax:   103.00 cm/s LVOT Vmean:  57.200 cm/s LVOT VTI:    0.173 m AI PHT:      400 msec  AORTA Ao Root diam: 4.80  cm Ao Asc diam:  3.40 cm MITRAL VALVE               TRICUSPID VALVE MV Area (PHT): 2.91 cm    TR Peak grad:   25.4 mmHg MV Decel Time: 261 msec    TR Vmax:        252.00 cm/s MV E velocity: 55.50 cm/s MV A velocity: 74.40 cm/s  SHUNTS MV E/A ratio:  0.75        Systemic VTI:  0.17 m                            Systemic Diam: 2.30 cm Kirk Ruths MD Electronically signed by Kirk Ruths MD Signature Date/Time: 04/03/2019/3:11:19 PM    Final     Microbiology: Recent Results (from the past 240 hour(s))  SARS CORONAVIRUS 2 (TAT 6-24 HRS) Nasopharyngeal Nasopharyngeal Swab     Status: None   Collection Time: 04/01/19  6:13 PM   Specimen: Nasopharyngeal Swab  Result Value Ref Range Status   SARS Coronavirus 2 NEGATIVE NEGATIVE Final    Comment: (NOTE) SARS-CoV-2 target nucleic acids are NOT DETECTED. The SARS-CoV-2 RNA is generally detectable in upper and lower respiratory specimens during the acute phase of infection. Negative results do not preclude SARS-CoV-2 infection, do not rule out co-infections with other pathogens, and should not be used as the sole basis for treatment or other patient management decisions. Negative results must be combined with clinical observations, patient history, and epidemiological information. The expected result is Negative. Fact Sheet for Patients: SugarRoll.be Fact Sheet for Healthcare Providers: https://www.woods-mathews.com/ This test is not yet approved or cleared by the [place] FDA and  has been authorized for detection and/or diagnosis of SARS-CoV-2 by FDA under an Emergency Use Authorization (EUA). This EUA will remain  in effect (meaning this test can be used) for the duration of the COVID-19 declaration under Section 56 4(b)(1) of the Act, 21 U.S.C. section 360bbb-3(b)(1), unless the authorization is terminated or revoked sooner. Performed at [facility], [place] [address], [place],  [place] [address]      Labs: Basic Metabolic Panel: Recent Labs  Lab 04/01/19 1440 04/02/19 0437 04/03/19 0542 04/04/19 0454 04/05/19 0347  NA 136 133* 132* 132* 130*  K 2.8* 5.1  4.4 4.0 3.4*  CL 108 104 101 99 95*  CO2 19* '22 24 27 26  '$ GLUCOSE 126* 118* 114* 110* 92  BUN 14 16 22* 21* 13  CREATININE 0.72 0.90 0.81 0.70 0.69  CALCIUM 6.5* 8.2* 8.2* 7.9* 8.3*  MG  --  1.6*  --   --   --   PHOS  --  2.6  --   --   --    Liver Function Tests: Recent Labs  Lab 04/01/19 1440  AST 23  ALT 24  ALKPHOS 41  BILITOT 0.4  PROT 4.9*  ALBUMIN 2.5*   No results for input(s): LIPASE, AMYLASE in the last 168 hours. No results for input(s): AMMONIA in the last 168 hours. CBC: Recent Labs  Lab 04/01/19 1440 04/02/19 0437 04/03/19 0542 04/04/19 0454 04/05/19 0347  WBC 3.2* 3.3* 3.9* 3.7* 5.7  NEUTROABS 2.3  --   --   --   --   HGB 6.4* 8.4* 7.8* 7.4* 9.2*  HCT 20.3* 25.8*  25.0* 23.6* 22.6* 27.8*  MCV 98.5 95.2 97.9 96.6 94.9  PLT 144* 129* 131* 151 183   Cardiac Enzymes: No results for input(s): CKTOTAL, CKMB, CKMBINDEX, TROPONINI in the last 168 hours. BNP: BNP (last 3 results) No results for input(s): BNP in the last 8760 hours.  ProBNP (last 3 results) No results for input(s): PROBNP in the last 8760 hours.  CBG: No results for input(s): GLUCAP in the last 168 hours.     Signed:  Kayleen Memos, MD [facility] 04/05/2019, 12:19 PM

## 2019-04-05 NOTE — Progress Notes (Signed)
Patient refused all his medications last night and his tube feeds. He stated that the feed made him nauseated and he "just wanst to rest". He also refuses to wear a trach collar for humidification.

## 2019-04-05 NOTE — Discharge Instructions (Signed)
Anemia  Anemia is a condition in which you do not have enough red blood cells or hemoglobin. Hemoglobin is a substance in red blood cells that carries oxygen. When you do not have enough red blood cells or hemoglobin (are anemic), your body cannot get enough oxygen and your organs may not work properly. As a result, you may feel very tired or have other problems. What are the causes? Common causes of anemia include:  Excessive bleeding. Anemia can be caused by excessive bleeding inside or outside the body, including bleeding from the intestine or from periods in women.  Poor nutrition.  Long-lasting (chronic) kidney, thyroid, and liver disease.  Bone marrow disorders.  Cancer and treatments for cancer.  HIV (human immunodeficiency virus) and AIDS (acquired immunodeficiency syndrome).  Treatments for HIV and AIDS.  Spleen problems.  Blood disorders.  Infections, medicines, and autoimmune disorders that destroy red blood cells. What are the signs or symptoms? Symptoms of this condition include:  Minor weakness.  Dizziness.  Headache.  Feeling heartbeats that are irregular or faster than normal (palpitations).  Shortness of breath, especially with exercise.  Paleness.  Cold sensitivity.  Indigestion.  Nausea.  Difficulty sleeping.  Difficulty concentrating. Symptoms may occur suddenly or develop slowly. If your anemia is mild, you may not have symptoms. How is this diagnosed? This condition is diagnosed based on:  Blood tests.  Your medical history.  A physical exam.  Bone marrow biopsy. Your health care provider may also check your stool (feces) for blood and may do additional testing to look for the cause of your bleeding. You may also have other tests, including:  Imaging tests, such as a CT scan or MRI.  Endoscopy.  Colonoscopy. How is this treated? Treatment for this condition depends on the cause. If you continue to lose a lot of blood, you may  need to be treated at a hospital. Treatment may include:  Taking supplements of iron, vitamin M08, or folic acid.  Taking a hormone medicine (erythropoietin) that can help to stimulate red blood cell growth.  Having a blood transfusion. This may be needed if you lose a lot of blood.  Making changes to your diet.  Having surgery to remove your spleen. Follow these instructions at home:  Take over-the-counter and prescription medicines only as told by your health care provider.  Take supplements only as told by your health care provider.  Follow any diet instructions that you were given.  Keep all follow-up visits as told by your health care provider. This is important. Contact a health care provider if:  You develop new bleeding anywhere in the body. Get help right away if:  You are very weak.  You are short of breath.  You have pain in your abdomen or chest.  You are dizzy or feel faint.  You have trouble concentrating.  You have bloody or black, tarry stools.  You vomit repeatedly or you vomit up blood. Summary  Anemia is a condition in which you do not have enough red blood cells or enough of a substance in your red blood cells that carries oxygen (hemoglobin).  Symptoms may occur suddenly or develop slowly.  If your anemia is mild, you may not have symptoms.  This condition is diagnosed with blood tests as well as a medical history and physical exam. Other tests may be needed.  Treatment for this condition depends on the cause of the anemia. This information is not intended to replace advice given to you by  your health care provider. Make sure you discuss any questions you have with your health care provider. Document Revised: 12/01/2016 Document Reviewed: 01/21/2016 Elsevier Patient Education  [place].

## 2019-04-07 ENCOUNTER — Ambulatory Visit
Admission: RE | Admit: 2019-04-07 | Discharge: 2019-04-07 | Disposition: A | Discharge status: Home / Self Care | Payer: 59 | Source: Ambulatory Visit | Attending: Radiation Oncology | Admitting: Radiation Oncology

## 2019-04-07 ENCOUNTER — Other Ambulatory Visit: Payer: Self-pay

## 2019-04-07 DIAGNOSIS — Z7982 Long term (current) use of aspirin: Secondary | ICD-10-CM | POA: Diagnosis not present

## 2019-04-07 DIAGNOSIS — Z930 Tracheostomy status: Secondary | ICD-10-CM | POA: Diagnosis not present

## 2019-04-07 DIAGNOSIS — Z8673 Personal history of transient ischemic attack (TIA), and cerebral infarction without residual deficits: Secondary | ICD-10-CM | POA: Diagnosis not present

## 2019-04-07 DIAGNOSIS — C321 Malignant neoplasm of supraglottis: Secondary | ICD-10-CM | POA: Insufficient documentation

## 2019-04-07 DIAGNOSIS — Z5111 Encounter for antineoplastic chemotherapy: Secondary | ICD-10-CM | POA: Diagnosis not present

## 2019-04-07 NOTE — Progress Notes (Signed)
Oncology Nurse Navigator Documentation  Ms. Benzel called me concerned about her husband. She reported that he returned from his radiation treatment today not feeling well. He reported weakness to her as his symptom, and is currently sleeping.  I told her that I am concerned about him because he was just discharged from the hospital due to a low hemoglobin level with a positive FOBT. He declined EGD while in hospital to evaluate his low hemoglobin.  I advised that he would be seeing Dr. Sondra Come after his radiation treatment tomorrow as part of his normal weekly Radiation MD check in. I also advised to make Dr. Delton Coombes aware as he administers Mr. Laski chemotherapy. Lastly, I advised them to go to the Emergency Department if his symptoms became more severe such a visible blood in his stool, feeling faint, or other changes to his current state.  She voiced her understanding and knows to call me if she has any further questions or concerns.  Harlow Asa RN, BSN, OCN Head & Neck Oncology Nurse [place] at [facility] Phone # [phone]  Fax # [phone]

## 2019-04-07 NOTE — Progress Notes (Signed)
.  Pharmacist Chemotherapy Monitoring - Follow Up Assessment    I verify that I have reviewed each item in the below checklist:  . Regimen for the patient is scheduled for the appropriate day and plan matches scheduled date. Marland Kitchen Appropriate non-routine labs are ordered dependent on drug ordered. . If applicable, additional medications reviewed and ordered per protocol based on lifetime cumulative doses and/or treatment regimen.   Plan for follow-up and/or issues identified: Yes . I-vent associated with next due treatment: No . MD and/or nursing notified: No  . Hospitalization last week.  Wynona Neat 04/07/2019 4:08 PM

## 2019-04-08 ENCOUNTER — Ambulatory Visit (HOSPITAL_COMMUNITY): Payer: 59

## 2019-04-08 ENCOUNTER — Ambulatory Visit: Payer: 59

## 2019-04-08 ENCOUNTER — Other Ambulatory Visit (HOSPITAL_COMMUNITY): Payer: 59

## 2019-04-08 ENCOUNTER — Ambulatory Visit (HOSPITAL_COMMUNITY): Payer: 59 | Admitting: Hematology

## 2019-04-08 ENCOUNTER — Other Ambulatory Visit: Payer: Self-pay

## 2019-04-08 ENCOUNTER — Inpatient Hospital Stay (HOSPITAL_COMMUNITY)
Admission: EM | Admit: 2019-04-08 | Discharge: 2019-04-14 | DRG: 871 | Disposition: A | Discharge status: Home Health | Payer: 59 | Attending: Family Medicine | Admitting: Family Medicine

## 2019-04-08 ENCOUNTER — Emergency Department (HOSPITAL_COMMUNITY): Payer: 59

## 2019-04-08 ENCOUNTER — Encounter (HOSPITAL_COMMUNITY): Payer: Self-pay | Admitting: Emergency Medicine

## 2019-04-08 DIAGNOSIS — Z87891 Personal history of nicotine dependence: Secondary | ICD-10-CM

## 2019-04-08 DIAGNOSIS — E876 Hypokalemia: Secondary | ICD-10-CM | POA: Diagnosis present

## 2019-04-08 DIAGNOSIS — F1010 Alcohol abuse, uncomplicated: Secondary | ICD-10-CM | POA: Diagnosis present

## 2019-04-08 DIAGNOSIS — E7800 Pure hypercholesterolemia, unspecified: Secondary | ICD-10-CM | POA: Diagnosis present

## 2019-04-08 DIAGNOSIS — B961 Klebsiella pneumoniae [K. pneumoniae] as the cause of diseases classified elsewhere: Secondary | ICD-10-CM | POA: Diagnosis present

## 2019-04-08 DIAGNOSIS — A4153 Sepsis due to Serratia: Secondary | ICD-10-CM | POA: Diagnosis present

## 2019-04-08 DIAGNOSIS — Z923 Personal history of irradiation: Secondary | ICD-10-CM | POA: Diagnosis not present

## 2019-04-08 DIAGNOSIS — R509 Fever, unspecified: Secondary | ICD-10-CM | POA: Diagnosis present

## 2019-04-08 DIAGNOSIS — D84821 Immunodeficiency due to drugs: Secondary | ICD-10-CM | POA: Diagnosis present

## 2019-04-08 DIAGNOSIS — Z955 Presence of coronary angioplasty implant and graft: Secondary | ICD-10-CM | POA: Diagnosis not present

## 2019-04-08 DIAGNOSIS — I10 Essential (primary) hypertension: Secondary | ICD-10-CM | POA: Diagnosis present

## 2019-04-08 DIAGNOSIS — Z888 Allergy status to other drugs, medicaments and biological substances status: Secondary | ICD-10-CM

## 2019-04-08 DIAGNOSIS — Z6822 Body mass index (BMI) 22.0-22.9, adult: Secondary | ICD-10-CM

## 2019-04-08 DIAGNOSIS — Y95 Nosocomial condition: Secondary | ICD-10-CM | POA: Diagnosis present

## 2019-04-08 DIAGNOSIS — A419 Sepsis, unspecified organism: Secondary | ICD-10-CM

## 2019-04-08 DIAGNOSIS — E440 Moderate protein-calorie malnutrition: Secondary | ICD-10-CM | POA: Diagnosis present

## 2019-04-08 DIAGNOSIS — K219 Gastro-esophageal reflux disease without esophagitis: Secondary | ICD-10-CM | POA: Diagnosis present

## 2019-04-08 DIAGNOSIS — E785 Hyperlipidemia, unspecified: Secondary | ICD-10-CM | POA: Diagnosis present

## 2019-04-08 DIAGNOSIS — A4159 Other Gram-negative sepsis: Principal | ICD-10-CM | POA: Diagnosis present

## 2019-04-08 DIAGNOSIS — I2510 Atherosclerotic heart disease of native coronary artery without angina pectoris: Secondary | ICD-10-CM | POA: Diagnosis present

## 2019-04-08 DIAGNOSIS — Z931 Gastrostomy status: Secondary | ICD-10-CM

## 2019-04-08 DIAGNOSIS — R1310 Dysphagia, unspecified: Secondary | ICD-10-CM | POA: Diagnosis present

## 2019-04-08 DIAGNOSIS — D61818 Other pancytopenia: Secondary | ICD-10-CM | POA: Diagnosis present

## 2019-04-08 DIAGNOSIS — C321 Malignant neoplasm of supraglottis: Secondary | ICD-10-CM | POA: Diagnosis present

## 2019-04-08 DIAGNOSIS — Z9889 Other specified postprocedural states: Secondary | ICD-10-CM | POA: Diagnosis not present

## 2019-04-08 DIAGNOSIS — C760 Malignant neoplasm of head, face and neck: Secondary | ICD-10-CM | POA: Diagnosis present

## 2019-04-08 DIAGNOSIS — Z20822 Contact with and (suspected) exposure to covid-19: Secondary | ICD-10-CM | POA: Diagnosis present

## 2019-04-08 DIAGNOSIS — I69354 Hemiplegia and hemiparesis following cerebral infarction affecting left non-dominant side: Secondary | ICD-10-CM | POA: Diagnosis not present

## 2019-04-08 DIAGNOSIS — J189 Pneumonia, unspecified organism: Secondary | ICD-10-CM | POA: Diagnosis present

## 2019-04-08 DIAGNOSIS — Z79899 Other long term (current) drug therapy: Secondary | ICD-10-CM

## 2019-04-08 DIAGNOSIS — I639 Cerebral infarction, unspecified: Secondary | ICD-10-CM | POA: Diagnosis present

## 2019-04-08 DIAGNOSIS — Z7982 Long term (current) use of aspirin: Secondary | ICD-10-CM

## 2019-04-08 DIAGNOSIS — Z8249 Family history of ischemic heart disease and other diseases of the circulatory system: Secondary | ICD-10-CM

## 2019-04-08 DIAGNOSIS — Z930 Tracheostomy status: Secondary | ICD-10-CM

## 2019-04-08 DIAGNOSIS — E871 Hypo-osmolality and hyponatremia: Secondary | ICD-10-CM | POA: Diagnosis present

## 2019-04-08 DIAGNOSIS — I6322 Cerebral infarction due to unspecified occlusion or stenosis of basilar arteries: Secondary | ICD-10-CM

## 2019-04-08 DIAGNOSIS — T451X5A Adverse effect of antineoplastic and immunosuppressive drugs, initial encounter: Secondary | ICD-10-CM | POA: Diagnosis present

## 2019-04-08 LAB — URINALYSIS, ROUTINE W REFLEX MICROSCOPIC
Bacteria, UA: NONE SEEN
Bilirubin Urine: NEGATIVE
Glucose, UA: NEGATIVE mg/dL
Hgb urine dipstick: NEGATIVE
Ketones, ur: NEGATIVE mg/dL
Leukocytes,Ua: NEGATIVE
Nitrite: NEGATIVE
Protein, ur: 100 mg/dL — AB
Specific Gravity, Urine: 1.025 (ref 1.005–1.030)
pH: 5 (ref 5.0–8.0)

## 2019-04-08 LAB — COMPREHENSIVE METABOLIC PANEL
ALT: 16 U/L (ref 0–44)
AST: 20 U/L (ref 15–41)
Albumin: 2.6 g/dL — ABNORMAL LOW (ref 3.5–5.0)
Alkaline Phosphatase: 47 U/L (ref 38–126)
Anion gap: 9 (ref 5–15)
BUN: 16 mg/dL (ref 6–20)
CO2: 23 mmol/L (ref 22–32)
Calcium: 7.7 mg/dL — ABNORMAL LOW (ref 8.9–10.3)
Chloride: 94 mmol/L — ABNORMAL LOW (ref 98–111)
Creatinine, Ser: 0.95 mg/dL (ref 0.61–1.24)
GFR calc Af Amer: >60 mL/min (ref >60)
GFR calc non Af Amer: >60 mL/min (ref >60)
Glucose, Bld: 152 mg/dL — ABNORMAL HIGH (ref 70–99)
Potassium: 3.4 mmol/L — ABNORMAL LOW (ref 3.5–5.1)
Sodium: 126 mmol/L — ABNORMAL LOW (ref 135–145)
Total Bilirubin: 0.7 mg/dL (ref 0.3–1.2)
Total Protein: 6.1 g/dL — ABNORMAL LOW (ref 6.5–8.1)

## 2019-04-08 LAB — RESPIRATORY PANEL BY RT PCR (FLU A&B, COVID)
Influenza A by PCR: NEGATIVE
Influenza B by PCR: NEGATIVE
SARS Coronavirus 2 by RT PCR: NEGATIVE

## 2019-04-08 LAB — CBC WITH DIFFERENTIAL/PLATELET
Abs Immature Granulocytes: 0.03 10*3/uL (ref 0.00–0.07)
Basophils Absolute: 0 10*3/uL (ref 0.0–0.1)
Basophils Relative: 0 %
Eosinophils Absolute: 0 10*3/uL (ref 0.0–0.5)
Eosinophils Relative: 0 %
HCT: 28.4 % — ABNORMAL LOW (ref 39.0–52.0)
Hemoglobin: 9.4 g/dL — ABNORMAL LOW (ref 13.0–17.0)
Immature Granulocytes: 1 %
Lymphocytes Relative: 2 %
Lymphs Abs: 0.1 10*3/uL — ABNORMAL LOW (ref 0.7–4.0)
MCH: 31.2 pg (ref 26.0–34.0)
MCHC: 33.1 g/dL (ref 30.0–36.0)
MCV: 94.4 fL (ref 80.0–100.0)
Monocytes Absolute: 0.2 10*3/uL (ref 0.1–1.0)
Monocytes Relative: 4 %
Neutro Abs: 3.9 10*3/uL (ref 1.7–7.7)
Neutrophils Relative %: 93 %
Platelets: 125 10*3/uL — ABNORMAL LOW (ref 150–400)
RBC: 3.01 MIL/uL — ABNORMAL LOW (ref 4.22–5.81)
RDW: 15.7 % — ABNORMAL HIGH (ref 11.5–15.5)
WBC: 4.2 10*3/uL (ref 4.0–10.5)
nRBC: 0 % (ref 0.0–0.2)

## 2019-04-08 LAB — POC SARS CORONAVIRUS 2 AG -  ED: SARS Coronavirus 2 Ag: NEGATIVE

## 2019-04-08 LAB — APTT: aPTT: 30 seconds (ref 24–36)

## 2019-04-08 LAB — LACTIC ACID, PLASMA
Lactic Acid, Venous: 2.1 mmol/L (ref 0.5–1.9)
Lactic Acid, Venous: 1.4 mmol/L (ref 0.5–1.9)

## 2019-04-08 LAB — STREP PNEUMONIAE URINARY ANTIGEN: Strep Pneumo Urinary Antigen: NEGATIVE

## 2019-04-08 LAB — MAGNESIUM: Magnesium: 1.6 mg/dL — ABNORMAL LOW (ref 1.7–2.4)

## 2019-04-08 LAB — PHOSPHORUS: Phosphorus: 2.4 mg/dL — ABNORMAL LOW (ref 2.5–4.6)

## 2019-04-08 LAB — PROTIME-INR
INR: 1.1 (ref 0.8–1.2)
Prothrombin Time: 14.5 seconds (ref 11.4–15.2)

## 2019-04-08 MED ORDER — PRO-STAT SUGAR FREE PO LIQD
30.0000 mL | Freq: Two times a day (BID) | ORAL | Status: DC
  Administered 2019-04-08: 30 mL
  Filled 2019-04-08: qty 30

## 2019-04-08 MED ORDER — PANTOPRAZOLE SODIUM 40 MG PO TBEC
40.0000 mg | DELAYED_RELEASE_TABLET | Freq: Every day | ORAL | Status: DC
  Filled 2019-04-08: qty 1

## 2019-04-08 MED ORDER — ONDANSETRON HCL 4 MG/2ML IJ SOLN: 4.0000 mg | Freq: Four times a day (QID) | INTRAMUSCULAR | Status: DC | PRN

## 2019-04-08 MED ORDER — ONDANSETRON HCL 4 MG PO TABS: 4.0000 mg | ORAL_TABLET | Freq: Four times a day (QID) | ORAL | Status: DC | PRN

## 2019-04-08 MED ORDER — IPRATROPIUM-ALBUTEROL 0.5-2.5 (3) MG/3ML IN SOLN: 3.0000 mL | Freq: Four times a day (QID) | RESPIRATORY_TRACT | Status: DC | PRN

## 2019-04-08 MED ORDER — OSMOLITE 1.5 CAL PO LIQD
1000.0000 mL | Freq: Four times a day (QID) | ORAL | Status: DC
  Administered 2019-04-09 – 2019-04-13 (×8): 1000 mL

## 2019-04-08 MED ORDER — SODIUM CHLORIDE 0.9 % IV SOLN
2.0000 g | Freq: Once | INTRAVENOUS | Status: AC
  Administered 2019-04-08: 2 g via INTRAVENOUS
  Filled 2019-04-08: qty 2

## 2019-04-08 MED ORDER — PROSOURCE TF PO LIQD: 30.0000 mL | Freq: Every day | ORAL | Status: DC

## 2019-04-08 MED ORDER — ACETAMINOPHEN 325 MG PO TABS: 650.0000 mg | ORAL_TABLET | Freq: Four times a day (QID) | ORAL | Status: DC | PRN

## 2019-04-08 MED ORDER — ASPIRIN EC 81 MG PO TBEC: 81.0000 mg | DELAYED_RELEASE_TABLET | Freq: Every day | ORAL | Status: DC

## 2019-04-08 MED ORDER — ACETAMINOPHEN 650 MG RE SUPP: 650.0000 mg | Freq: Four times a day (QID) | RECTAL | Status: DC | PRN

## 2019-04-08 MED ORDER — NITROGLYCERIN 0.4 MG SL SUBL: 0.4000 mg | SUBLINGUAL_TABLET | SUBLINGUAL | Status: DC | PRN

## 2019-04-08 MED ORDER — POTASSIUM CHLORIDE 10 MEQ/100ML IV SOLN
10.0000 meq | INTRAVENOUS | Status: AC
  Administered 2019-04-08 (×2): 10 meq via INTRAVENOUS
  Filled 2019-04-08 (×2): qty 100

## 2019-04-08 MED ORDER — OSMOLITE 1.5 CAL PO LIQD: 1000.0000 mL | Freq: Four times a day (QID) | ORAL | Status: DC

## 2019-04-08 MED ORDER — LIDOCAINE VISCOUS HCL 2 % MT SOLN
15.0000 mL | Freq: Three times a day (TID) | OROMUCOSAL | Status: DC
  Administered 2019-04-08 – 2019-04-09 (×2): 15 mL via OROMUCOSAL
  Filled 2019-04-08 (×7): qty 15

## 2019-04-08 MED ORDER — SODIUM CHLORIDE 0.9 % IR SOLN: 200.0000 mL | Status: DC

## 2019-04-08 MED ORDER — SODIUM CHLORIDE 0.9 % IV BOLUS
500.0000 mL | Freq: Once | INTRAVENOUS | Status: AC
  Administered 2019-04-08: 500 mL via INTRAVENOUS

## 2019-04-08 MED ORDER — PRO-STAT SUGAR FREE PO LIQD
30.0000 mL | Freq: Every day | ORAL | Status: DC
  Administered 2019-04-09 – 2019-04-10 (×2): 30 mL
  Filled 2019-04-08 (×5): qty 30

## 2019-04-08 MED ORDER — SODIUM CHLORIDE 0.9 % IV SOLN: INTRAVENOUS | Status: DC

## 2019-04-08 MED ORDER — SODIUM CHLORIDE 0.9 % IV SOLN
2.0000 g | Freq: Three times a day (TID) | INTRAVENOUS | Status: DC
  Administered 2019-04-08 – 2019-04-11 (×8): 2 g via INTRAVENOUS
  Filled 2019-04-08 (×9): qty 2

## 2019-04-08 MED ORDER — HYDROCODONE-ACETAMINOPHEN 10-325 MG PO TABS
1.0000 | ORAL_TABLET | ORAL | Status: DC | PRN
  Administered 2019-04-08 – 2019-04-14 (×16): 1 via ORAL
  Filled 2019-04-08 (×17): qty 1

## 2019-04-08 MED ORDER — ATORVASTATIN CALCIUM 40 MG PO TABS
80.0000 mg | ORAL_TABLET | Freq: Every evening | ORAL | Status: DC
  Administered 2019-04-09 – 2019-04-13 (×5): 80 mg via ORAL
  Filled 2019-04-08 (×6): qty 2

## 2019-04-08 NOTE — Progress Notes (Signed)
Pharmacy Antibiotic Note  [name] is a 59 y.o. male admitted on 04/08/2019 with pneumonia.  Pharmacy has been consulted for cefepime dosing.  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 He received one dose of cefepime today. He is being r/o for PNA. His blood cultures were positive for serratia marcescens about 3 wks ago. He got a dose of cefepime before discharged home with 7d of levofloxacin. He has a trach in place.   Scr 0.95  Plan: Cefepime 2g IV q8  Height: 6' (182.9 cm) Weight: 74.8 kg (165 lb) IBW/kg (Calculated) : 77.6  Temp (24hrs), Avg:100 F (37.8 C), Min:98.3 F (36.8 C), Max:101.6 F (38.7 C)  Recent Labs  Lab 04/02/19 0437 04/03/19 0542 04/04/19 0454 04/05/19 0347 04/08/19 1309 04/08/19 1703  WBC 3.3* 3.9* 3.7* 5.7 4.2  --   CREATININE 0.90 0.81 0.70 0.69 0.95  --   LATICACIDVEN  --   --   --   --  2.1* 1.4    Estimated Creatinine Clearance: 90.8 mL/min (by C-G formula based on SCr of 0.95 mg/dL).    Allergies  Allergen Reactions  . Duloxetine Other (See Comments)    Caused depression  . Prednisone Other (See Comments)    Chest pain    Antimicrobials this admission: 4/6 cefepime>>  Dose adjustments this admission:   Microbiology results: 4/6 blood>> 4/6 urine?? 4/6 urinary strep>> 4/6 urinary legionella>>  Onnie Boer, PharmD, [place], AAHIVP, CPP Infectious Disease Pharmacist 04/08/2019 6:20 PM

## 2019-04-08 NOTE — ED Provider Notes (Signed)
[facility] Emergency Department Provider Note MRN:  [id number]  Arrival date & time: 04/08/19     Chief Complaint   Fever   History of Present Illness   [name] is a 59 y.o. year-old male with a history of laryngeal cancer, CAD presenting to the ED with chief complaint of fever.  General malaise, body aches, chills, fever for 2 or 3 days.  Currently on chemotherapy.  Mild cough, no dysuria, no vomiting, no diarrhea, no chest pain, mild shortness of breath, no abdominal pain.  Review of Systems  A complete 10 system review of systems was obtained and all systems are negative except as noted in the HPI and PMH.   Patient's Health History    Past Medical History:  Diagnosis Date  . Anxiety   . Arthritis    back  . Bradycardia    a. During 11/2012 adm: lopressor decreased.  . Cancer of larynx ([place])   . Carotid artery stenosis   . 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 Coronary artery disease    a. Diag cath 10/2012 for CP/abnormal nuc -> planned PCI s/p LAD atherectomy/DES placement 11/05/12.  Marland Kitchen GERD (gastroesophageal reflux disease)   . History of hiatal hernia   . Hypercholesteremia   . Hypertension   . Stroke ([place]) 07/31/2018   bilateral vertebrobasilar occlusion; "left side weaker thanit was before."    Past Surgical History:  Procedure Laterality Date  . CARDIAC CATHETERIZATION  10/29/2012  . CORONARY ANGIOPLASTY WITH STENT PLACEMENT  11/05/2012  . DIRECT LARYNGOSCOPY N/A 01/02/2019   Procedure: MICRO DIRECT LARYNGOSCOPY BIOPSY OF LARYNGEAL MASS;  Surgeon: [name], MD;  Location: [facility];  Service: ENT;  Laterality: N/A;  . HEMORRHOID SURGERY  ~ 2010  . INSERTION OF MESH N/A 06/02/2015   Procedure: INSERTION OF MESH;  Surgeon: [name], MD;  Location: [facility];  Service: General;  Laterality: N/A;  . IR ANGIO INTRA EXTRACRAN SEL COM CAROTID INNOMINATE BILAT  MOD SED  08/02/2018  . IR ANGIO VERTEBRAL SEL VERTEBRAL BILAT MOD SED  08/02/2018  . LAPAROSCOPIC INSERTION GASTROSTOMY TUBE N/A 02/24/2019   Procedure: LAPAROSCOPIC INSERTION GASTROSTOMY TUBE;  Surgeon: [name], MD;  Location: [facility];  Service: General;  Laterality: N/A;  . LEFT HEART CATHETERIZATION WITH CORONARY ANGIOGRAM N/A 10/30/2012   Procedure: LEFT HEART CATHETERIZATION WITH CORONARY ANGIOGRAM;  Surgeon: [name], MD;  Location: [facility];  Service: Cardiovascular;  Laterality: N/A;  . MULTIPLE EXTRACTIONS WITH ALVEOLOPLASTY N/A 02/18/2019   Procedure: Extraction of tooth #'s 5-7, 10,11,14,20-29, 31 and 32 with alveoloplasty and maxiillary left buccal exostoses reductions;  Surgeon: [name], [name];  Location: [facility];  Service: Oral Surgery;  Laterality: N/A;  . PERCUTANEOUS CORONARY ROTOBLATOR INTERVENTION (PCI-R) N/A 11/05/2012   Procedure: PERCUTANEOUS CORONARY ROTOBLATOR INTERVENTION (PCI-R);  Surgeon: [name], MD;  Location: [facility];  Service: Cardiovascular;  Laterality: N/A;  . TRACHEOSTOMY TUBE PLACEMENT N/A 02/18/2019   Procedure: Tracheostomy;  Surgeon: [name], MD;  Location: [facility];  Service: ENT;  Laterality: N/A;  . UMBILICAL HERNIA REPAIR N/A 06/02/2015   Procedure: UMBILICAL HERNIORRHAPHY WITH MESH;  Surgeon: [name], MD;  Location: [facility];  Service: General;  Laterality: N/A;    Family History  Problem Relation Age of Onset  . Hypertension Mother   . Heart disease Father   .  Hypertension Father   . Heart disease Sister   . Hypertension Maternal Grandmother   . Hypertension Maternal Grandfather   . Hypertension Paternal Grandmother   . Hypertension Paternal Grandfather     Social History   Socioeconomic History  . Marital status: Married    Spouse name: Not on file  . Number of children: 4  . Years of education: Not on file  . Highest education level: Not on file  Occupational History  . Occupation: Disabled  Tobacco Use  .  Smoking status: Former Smoker    Packs/day: 3.00    Years: 32.00    Pack years: 96.00    Types: Cigarettes    Start date: 08/29/1977    Quit date: 11/29/2006    Years since quitting: 12.3  . Smokeless tobacco: Former Systems developer    Types: Chew  . Tobacco comment: 11/05/2012 "chewed tobacco when I play ball; aien't chewed since age 36"  Substance and Sexual Activity  . Alcohol use: Yes    Alcohol/week: 12.0 standard drinks    Types: 12 Cans of beer per week    Comment: None since 02/2019   . Drug use: Yes    Types: Marijuana    Comment: denies on 04/08/19  . Sexual activity: Not Currently  Other Topics Concern  . Not on file  Social History Narrative   Patient is disabled.  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   Patient quit smoking 10 to 12 years ago.  Patient previously 3 pack/day use.   Patient currently drinking 3-4 beers per day from 12 pack a day.   Patient denies use of chew or other illicit drugs.   Social Determinants of Health   Financial Resource Strain: Low Risk   . Difficulty of Paying Living Expenses: Not hard at all  Food Insecurity: No Food Insecurity  . Worried About Charity fundraiser in the Last Year: Never true  . Ran Out of Food in the Last Year: Never true  Transportation Needs: No Transportation Needs  . Lack of Transportation (Medical): No  . Lack of Transportation (Non-Medical): No  Physical Activity: Insufficiently Active  . Days of Exercise per Week: 2 days  . Minutes of Exercise per Session: 30 min  Stress: Stress Concern Present  . Feeling of Stress : To some extent  Social Connections: Moderately Isolated  . Frequency of Communication with Friends and Family: Once a week  . Frequency of Social Gatherings with Friends and Family: Once a week  . Attends Religious Services: Never  . Active Member of Clubs or Organizations: No  . Attends Archivist Meetings: Never  . Marital Status: Married  Human resources officer  Violence: Not At Risk  . Fear of Current or Ex-Partner: No  . Emotionally Abused: No  . Physically Abused: No  . Sexually Abused: No     Physical Exam   Vitals:   04/08/19 1511 04/08/19 1530  BP:  140/80  Pulse: 88 88  Resp: 17 18  Temp: 98.3 F (36.8 C)   SpO2: 100% 100%    CONSTITUTIONAL: Chronically ill-appearing, NAD NEURO:  Alert and oriented x 3, no focal deficits EYES:  eyes equal and reactive ENT/NECK:  no LAD, no JVD, tracheostomy in place CARDIO: Regular rate, well-perfused, normal S1 and S2 PULM:  CTAB no wheezing or rhonchi GI/GU:  normal bowel sounds, non-distended, non-tender MSK/SPINE:  No gross deformities, no edema SKIN:  no rash, atraumatic PSYCH:  Appropriate speech  and behavior  *Additional and/or pertinent findings included in MDM below  Diagnostic and Interventional Summary    EKG Interpretation  Date/Time:  Tuesday April 08 2019 12:50:43 EDT Ventricular Rate:  94 PR Interval:    QRS Duration: 102 QT Interval:  380 QTC Calculation: 476 R Axis:   102 Text Interpretation: Sinus rhythm Right axis deviation Low voltage, extremity leads Abnormal R-wave progression, late transition Abnrm T, probable ischemia, anterolateral lds Baseline wander in lead(s) II III aVR aVF V6 No significant change was found Confirmed by Gerlene Fee [phone]) on 04/08/2019 1:11:05 PM      Labs Reviewed  LACTIC ACID, PLASMA - Abnormal; Notable for the following components:      Result Value   Lactic Acid, Venous 2.1 (*)    All other components within normal limits  COMPREHENSIVE METABOLIC PANEL - Abnormal; Notable for the following components:   Sodium 126 (*)    Potassium 3.4 (*)    Chloride 94 (*)    Glucose, Bld 152 (*)    Calcium 7.7 (*)    Total Protein 6.1 (*)    Albumin 2.6 (*)    All other components within normal limits  CBC WITH DIFFERENTIAL/PLATELET - Abnormal; Notable for the following components:   RBC 3.01 (*)    Hemoglobin 9.4 (*)    HCT 28.4 (*)     RDW 15.7 (*)    Platelets 125 (*)    Lymphs Abs 0.1 (*)    All other components within normal limits  CULTURE, BLOOD (ROUTINE X 2)  CULTURE, BLOOD (ROUTINE X 2)  URINE CULTURE  SARS CORONAVIRUS 2 (TAT 6-24 HRS)  APTT  PROTIME-INR  LACTIC ACID, PLASMA  URINALYSIS, ROUTINE W REFLEX MICROSCOPIC  POC SARS CORONAVIRUS 2 AG -  ED    DG Chest Port 1 View  Final Result      Medications  ceFEPIme (MAXIPIME) 2 g in sodium chloride 0.9 % 100 mL IVPB (0 g Intravenous Stopped 04/08/19 1400)  sodium chloride 0.9 % bolus 500 mL (500 mLs Intravenous New Bag/Given 04/08/19 1322)     Procedures  /  Critical Care .Critical Care Performed by: Maudie Flakes, MD Authorized by: Maudie Flakes, MD   Critical care provider statement:    Critical care time (minutes):  33   Critical care was necessary to treat or prevent imminent or life-threatening deterioration of the following conditions:  Sepsis   Critical care was time spent personally by me on the following activities:  Discussions with consultants, evaluation of patient's response to treatment, examination of patient, ordering and performing treatments and interventions, ordering and review of laboratory studies, ordering and review of radiographic studies, pulse oximetry, re-evaluation of patient's condition, obtaining history from patient or surrogate and review of old charts    ED Course and Medical Decision Making  I have reviewed the triage vital signs, the nursing notes, and pertinent available records from the EMR.  Listed above are laboratory and imaging tests that I personally ordered, reviewed, and interpreted and then considered in my medical decision making (see below for details).   Concern for sepsis given fever, hypoxia on my evaluation at 86% on room air.  Patient is on active chemotherapy, question if he is immunocompromised or neutropenic.  Will provide empiric cefepime, fluids, await further results.  Work-up reveals  mildly elevated lactate, hyponatremia, admitted to hospital service for further care.  Barth Kirks. Sedonia Small, [place] [email or web address]  Final  Clinical Impressions(s) / ED Diagnoses     ICD-10-CM   1. Sepsis, due to unspecified organism, unspecified whether acute organ dysfunction present ([place])  A41.9   2. Hyponatremia  E87.1     ED Discharge Orders    None       Discharge Instructions Discussed with and Provided to Patient:   Discharge Instructions   None       Maudie Flakes, MD 04/08/19 1549

## 2019-04-08 NOTE — ED Notes (Signed)
Lab called for 2nd Lactic

## 2019-04-08 NOTE — Progress Notes (Signed)
Oncology Nurse Navigator Documentation  I received a second call from Ms. Maske just now. She reports to me now that [name] has had a fever ranging from 102-107 (per their home measurement) since last night that is relieved with Tylenol. She continues to report drainage from his PEG tube. She also reports that he is shaky and weak, and is very concerned about him. I have advised her to take him to the Emergency Department for evaluation. She spoke to him while I was on the phone and relayed my recommendation, and he agreed to present to the Emergency Room.

## 2019-04-08 NOTE — Progress Notes (Signed)
Notified bedside nurse of need to draw repeat lactic acid. 

## 2019-04-08 NOTE — ED Notes (Signed)
CRITICAL VALUE ALERT  Critical Value:  Lactic acid 2.1  Date & Time Notied:  04/08/19, 1340  Provider Notified: Dr. Sedonia Small  Orders Received/Actions taken: no new orders

## 2019-04-08 NOTE — H&P (Signed)
History and Physical    [name] [name]:[id number] DOB: 1960/07/30 DOA: 04/08/2019  Referring MD/NP/PA: Dr. Sedonia Small PCP: [name], MD  Patient coming from: Home  Chief Complaint: Fever, shortness of breath and productive cough.  HPI: [name] is a 59 y.o. male with past medical history significant for malignant neoplasm of supraglottis, status post tracheostomy, actively receiving chemotherapy and radiation; dysphagia status post PEG tube placement, history of coronary artery disease a status post PCI with stenting in 2014, history of nonhemorrhagic CVA, chronic lower back pain, essential hypertension, hyperlipidemia, recent admission secondary to acute blood loss anemia presumed to be secondary to upper GI bleed (04/01/19--04/05/19); who presented to the emergency department secondary to worsening shortness of breath, generalized weakness and productive cough.  Patient also expressed having fevers at home and not feeling well.  Denies chest pain, no nausea, no vomiting, no dysuria, no hematuria, no melena, no hematochezia, no focal deficits or any other complaints.  In the ED work-up demonstrated chest x-ray at with right lower lobe atelectasis, temperature of 101.6 and hyponatremia (slightly worse than his baseline) and thrombocytopenia.  Initially was a slightly tachypneic with subsequent improvement by time of my examination.  2 L of oxygen supplementation in place.  Cultures taken, antibiotics initiated and TRH contacted 20 patient for further evaluation and management.  There is concern for HCAP as reason for his fevers.  Past Medical/Surgical History: Past Medical History:  Diagnosis Date  . Anxiety   . Arthritis    back  . Bradycardia    a. During 11/2012 adm: lopressor decreased.  . Cancer of larynx ([place])   . Carotid artery stenosis   . 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 Coronary artery  disease    a. Diag cath 10/2012 for CP/abnormal nuc -> planned PCI s/p LAD atherectomy/DES placement 11/05/12.  Marland Kitchen GERD (gastroesophageal reflux disease)   . History of hiatal hernia   . Hypercholesteremia   . Hypertension   . Stroke ([place]) 07/31/2018   bilateral vertebrobasilar occlusion; "left side weaker thanit was before."    Past Surgical History:  Procedure Laterality Date  . CARDIAC CATHETERIZATION  10/29/2012  . CORONARY ANGIOPLASTY WITH STENT PLACEMENT  11/05/2012  . DIRECT LARYNGOSCOPY N/A 01/02/2019   Procedure: MICRO DIRECT LARYNGOSCOPY BIOPSY OF LARYNGEAL MASS;  Surgeon: [name], MD;  Location: [facility];  Service: ENT;  Laterality: N/A;  . HEMORRHOID SURGERY  ~ 2010  . INSERTION OF MESH N/A 06/02/2015   Procedure: INSERTION OF MESH;  Surgeon: [name], MD;  Location: [facility];  Service: General;  Laterality: N/A;  . IR ANGIO INTRA EXTRACRAN SEL COM CAROTID INNOMINATE BILAT MOD SED  08/02/2018  . IR ANGIO VERTEBRAL SEL VERTEBRAL BILAT MOD SED  08/02/2018  . LAPAROSCOPIC INSERTION GASTROSTOMY TUBE N/A 02/24/2019   Procedure: LAPAROSCOPIC INSERTION GASTROSTOMY TUBE;  Surgeon: [name], MD;  Location: [facility];  Service: General;  Laterality: N/A;  . LEFT HEART CATHETERIZATION WITH CORONARY ANGIOGRAM N/A 10/30/2012   Procedure: LEFT HEART CATHETERIZATION WITH CORONARY ANGIOGRAM;  Surgeon: [name], MD;  Location: [facility];  Service: Cardiovascular;  Laterality: N/A;  . MULTIPLE EXTRACTIONS WITH ALVEOLOPLASTY N/A 02/18/2019   Procedure: Extraction of tooth #'s 5-7, 10,11,14,20-29, 31 and 32 with alveoloplasty and maxiillary left buccal exostoses reductions;  Surgeon: [name], [name];  Location: [facility];  Service: Oral Surgery;  Laterality:  N/A;  . PERCUTANEOUS CORONARY ROTOBLATOR INTERVENTION (PCI-R) N/A 11/05/2012   Procedure: PERCUTANEOUS CORONARY ROTOBLATOR INTERVENTION (PCI-R);  Surgeon: [name], MD;  Location: [facility];  Service: Cardiovascular;   Laterality: N/A;  . TRACHEOSTOMY TUBE PLACEMENT N/A 02/18/2019   Procedure: Tracheostomy;  Surgeon: [name], MD;  Location: [facility];  Service: ENT;  Laterality: N/A;  . UMBILICAL HERNIA REPAIR N/A 06/02/2015   Procedure: UMBILICAL HERNIORRHAPHY WITH MESH;  Surgeon: [name], MD;  Location: [facility];  Service: General;  Laterality: N/A;    Social History:  reports that he quit smoking about 12 years ago. His smoking use included cigarettes. He started smoking about 41 years ago. He has a 96.00 pack-year smoking history. He has quit using smokeless tobacco.  His smokeless tobacco use included chew. He reports current alcohol use of about 12.0 standard drinks of alcohol per week. He reports current drug use. Drug: Marijuana.  Allergies: Allergies  Allergen Reactions  . Duloxetine Other (See Comments)    Caused depression  . Prednisone Other (See Comments)    Chest pain    Family History:  Family History  Problem Relation Age of Onset  . Hypertension Mother   . Heart disease Father   . Hypertension Father   . Heart disease Sister   . Hypertension Maternal Grandmother   . Hypertension Maternal Grandfather   . Hypertension Paternal Grandmother   . Hypertension Paternal Grandfather     Prior to Admission medications   Medication Sig Start Date End Date Taking? Authorizing Provider  aspirin EC 81 MG tablet Take 81 mg by mouth daily.   Yes [provider]  atorvastatin (LIPITOR) 80 MG tablet Take 1 tablet (80 mg total) by mouth every evening. Patient taking differently: Take 40 mg by mouth in the morning and at bedtime.  10/23/17  Yes Herminio Commons, MD  Catheters (ARGYLE SUCTION CATHETER 14FR) MISC 1 Device by Does not apply route daily. 02/25/19  Yes Harold Hedge, MD  CISPLATIN IV Inject into the vein once a week. Weekly in conjunction with radiation therapy beginning 03/06/2019 03/04/19  Yes [provider]  ferrous sulfate 325 (65 FE) MG tablet Take 325 mg by  mouth daily with breakfast.   Yes [provider]  HYDROcodone-acetaminophen (NORCO) 10-325 MG tablet Take 1 tablet by mouth every 4 (four) hours as needed. Patient taking differently: Take 1 tablet by mouth every 4 (four) hours as needed for severe pain.  06/02/15  Yes [name], MD  lidocaine (XYLOCAINE) 2 % solution Patient: Mix 1part 2% viscous lidocaine, 1part H20. Swish & swallow 55m of diluted mixture, 334m before meals and at bedtime, up to QID Patient taking differently: Use as directed 15 mLs in the mouth or throat See admin instructions. 30 minutes before meals and at bedtime, up to 4 times daily. 03/10/19  Yes SqEppie GibsonMD  Magnesium Oxide -Mg Supplement 400 MG CAPS Give 400 mg by tube daily for 5 days. 04/05/19 04/10/19 Yes Hall, CaLorenda CahillDO  nitroGLYCERIN (NITROSTAT) 0.4 MG SL tablet Place 1 tablet (0.4 mg total) under the tongue every 5 (five) minutes as needed for chest pain (CP or SOB). 07/27/14  Yes KoHerminio CommonsMD  Nutritional Supplements (FEEDING SUPPLEMENT, OSMOLITE 1.5 CAL,) LIQD Give 2 cartons of osmolite 1.5 at 9:30am, 1:30, 5:30 and 1 carton at 9:30pm.  Flush with 6053mf water before and 120m36mter each feeding. Meets 100% of patient needs. Send bolus supplies. Patient taking  differently: Place 1,000 mLs into feeding tube in the morning, at noon, in the evening, and at bedtime. Give 2 cartons of osmolite 1.5 at 9:30am, 1:30, 5:30 and 1 carton at 9:30pm.  Flush with 48m of water before and 1264mafter each feeding. Meets 100% of patient needs. Send bolus supplies. 03/19/19  Yes SqEppie GibsonMD  Nutritional Supplements (PROSOURCE TF) LIQD Give 3081m times per day via feeding tube.  Mix with 38m53m water.  Flush tube with 38ml85mwater, then place prosource and water mixture in tube and flush with 38ml 46mater after. Patient taking differently: Place 30 mLs into feeding tube in the morning and at bedtime. Mix with 38ml o73mter.  Flush tube with 38ml of38mter, then place prosource and water mixture in tube and flush with 38ml of 12mr after. 03/19/19  Yes Squire, SEppie Gibsonprazole (PRILOSEC) 20 MG capsule Take 20 mg by mouth daily.  07/22/18  Yes [provider]  prochlorperazine (COMPAZINE) 10 MG tablet TAKE 1 TABLET(10 MG) BY MOUTH EVERY 6 HOURS AS NEEDED FOR NAUSEA OR VOMITING Patient taking differently: Take 10 mg by mouth every 6 (six) hours as needed for nausea.  04/03/19  Yes KatragaddDerek Jackium chloride 0.9 % infusion Use as directed to flush PICC line 03/31/19  Yes KatragaddDerek Jackium chloride irrigation 0.9 % irrigation Irrigate with 200 mLs as directed continuous. 02/25/19  Yes Segal, JaHarold Hedgecheostomy Care KIT 1 kit by Does not apply route daily. 02/25/19  Yes Segal, JaHarold HedgeMADol (ULTRAM) 50 MG tablet Take 50-100 mg by mouth 4 (four) times daily as needed (pain.).  01/29/19  Yes [provider]  potassium chloride 20 MEQ/15ML (10%) SOLN Place 15 mLs (20 mEq total) into feeding tube daily for 5 days. Patient not taking: Reported on 04/08/2019 04/05/19 04/10/19  Hall, CarKayleen Memose[place] of Systems:  Neg except as otherwise mentioned on HPI.   Physical Exam: Vitals:   04/08/19 1500 04/08/19 1511 04/08/19 1530 04/08/19 1600  BP: (!) 165/85  140/80 (!) 157/89  Pulse: 84 88 88 95  Resp: '19 17 18 17  '$ Temp:  98.3 F (36.8 C)    TempSrc:  Oral    SpO2: 100% 100% 100% 100%  Weight:      Height:        Constitutional: Warm to touch, frail, oriented x3 denying chest pain,.  Intermittent coughing spells appreciated during interview. Eyes: PERRL, lids and conjunctivae normal, no icterus. ENMT: Mucous membranes are moist. Posterior pharynx clear of any exudate or lesions. tracheostomy in place. Neck: normal, supple, no masses, no thyromegaly, no JVD. Respiratory: Scattered rhonchi decreased breath sounds at the bases (right more than left); no wheezing. Cardiovascular: Regular  rate and rhythm, no murmurs / rubs / gallops. No extremity edema. 2+ pedal pulses. No carotid bruits.  Abdomen: no tenderness, no masses palpated. No hepatosplenomegaly. Bowel sounds positive.  PEG tube in place. Musculoskeletal: no clubbing / cyanosis. No joint deformity upper and lower extremities. Good ROM. Skin: no rashes, lesions, ulcers. No induration Neurologic: CN 2-12 grossly intact. Sensation intact, DTR normal. Strength 4/5 in all 4 limbs..  PsychiMarland Kitchentric: Normal judgment and insight. Alert and oriented x 3. Normal mood.    Labs on Admission: I have personally reviewed the following labs and imaging studies  CBC: Recent Labs  Lab 04/02/19 0437 04/03/19 0542 04/04/19 0454 04/05/19 0347 04/08/19 1309  WBC 3.3* 3.9* 3.7* 5.7 4.2  NEUTROABS  --   --   --   --  3.9  HGB 8.4* 7.8* 7.4* 9.2* 9.4*  HCT 25.8*  25.0* 23.6* 22.6* 27.8* 28.4*  MCV 95.2 97.9 96.6 94.9 94.4  PLT 129* 131* 151 183 443*   Basic Metabolic Panel: Recent Labs  Lab 04/02/19 0437 04/03/19 0542 04/04/19 0454 04/05/19 0347 04/08/19 1309  NA 133* 132* 132* 130* 126*  K 5.1 4.4 4.0 3.4* 3.4*  CL 104 101 99 95* 94*  CO2 '22 24 27 26 23  '$ GLUCOSE 118* 114* 110* 92 152*  BUN 16 22* 21* 13 16  CREATININE 0.90 0.81 0.70 0.69 0.95  CALCIUM 8.2* 8.2* 7.9* 8.3* 7.7*  MG 1.6*  --   --   --   --   PHOS 2.6  --   --   --   --    GFR: Estimated Creatinine Clearance: 90.8 mL/min (by C-G formula based on SCr of 0.95 mg/dL).   Liver Function Tests: Recent Labs  Lab 04/08/19 1309  AST 20  ALT 16  ALKPHOS 47  BILITOT 0.7  PROT 6.1*  ALBUMIN 2.6*   Coagulation Profile: Recent Labs  Lab 04/08/19 1309  INR 1.1   Urine analysis:    Component Value Date/Time   COLORURINE YELLOW 03/17/2019 1600   APPEARANCEUR CLEAR 03/17/2019 1600   LABSPEC 1.021 03/17/2019 1600   PHURINE 6.0 03/17/2019 1600   GLUCOSEU NEGATIVE 03/17/2019 1600   HGBUR NEGATIVE 03/17/2019 1600   BILIRUBINUR NEGATIVE 03/17/2019 1600    KETONESUR NEGATIVE 03/17/2019 1600   PROTEINUR 30 (A) 03/17/2019 1600   NITRITE NEGATIVE 03/17/2019 1600   LEUKOCYTESUR NEGATIVE 03/17/2019 1600    Recent Results (from the past 240 hour(s))  SARS CORONAVIRUS 2 (TAT 6-24 HRS) Nasopharyngeal Nasopharyngeal Swab     Status: None   Collection Time: 04/01/19  6:13 PM   Specimen: Nasopharyngeal Swab  Result Value Ref Range Status   SARS Coronavirus 2 NEGATIVE NEGATIVE Final    Comment: (NOTE) SARS-CoV-2 target nucleic acids are NOT DETECTED. The SARS-CoV-2 RNA is generally detectable in upper and lower respiratory specimens during the acute phase of infection. Negative results do not preclude SARS-CoV-2 infection, do not rule out co-infections with other pathogens, and should not be used as the sole basis for treatment or other patient management decisions. Negative results must be combined with clinical observations, patient history, and epidemiological information. The expected result is Negative. Fact Sheet for Patients: SugarRoll.be Fact Sheet for Healthcare Providers: https://www.woods-mathews.com/ This test is not yet approved or cleared by the [place] FDA and  has been authorized for detection and/or diagnosis of SARS-CoV-2 by FDA under an Emergency Use Authorization (EUA). This EUA will remain  in effect (meaning this test can be used) for the duration of the COVID-19 declaration under Section 56 4(b)(1) of the Act, 21 U.S.C. section 360bbb-3(b)(1), unless the authorization is terminated or revoked sooner. Performed at [facility], [place] [address], [place], [place] [address]   Blood Culture (routine x 2)     Status: None (Preliminary result)   Collection Time: 04/08/19  1:09 PM   Specimen: BLOOD  Result Value Ref Range Status   Specimen Description BLOOD LEFT ANTECUBITAL  Final   Special Requests Blood Culture adequate volume  Final   Culture   Final    NO GROWTH <12  HOURS Performed at [facility], [address] [place], [place] [address]  Report Status PENDING  Incomplete  Blood Culture (routine x 2)     Status: None (Preliminary result)   Collection Time: 04/08/19  1:09 PM   Specimen: BLOOD  Result Value Ref Range Status   Specimen Description BLOOD PICC LINE DRAWN BY RN  Final   Special Requests   Final    Blood Culture results may not be optimal due to an inadequate volume of blood received in culture bottles   Culture   Final    NO GROWTH <12 HOURS Performed at [facility], [address] [place], [place] [address]    Report Status PENDING  Incomplete     Radiological Exams on Admission: DG Chest Port 1 View  Result Date: 04/08/2019 CLINICAL DATA:  Hypoxia. Fever. EXAM: PORTABLE CHEST 1 VIEW COMPARISON:  04/01/2019 FINDINGS: Tracheostomy tube in good position. PICC tip is in good position just below the carina in the superior vena cava. The heart size is normal. Slight pulmonary vascular prominence without pulmonary edema. No effusions. No acute bone abnormality. IMPRESSION: Slight pulmonary vascular prominence. Electronically Signed   By: Lorriane Shire M.D.   On: 04/08/2019 13:35    EKG: Independently reviewed.  Sinus rhythm, normal rate and normal QT.  No signs of acute ischemic changes.  Assessment/Plan 1-febrile illness, acute: Associated with URI symptoms and concern for HCAP. -Patient also immunosuppressed (chronically receiving chemotherapeutic agents) and a high risk for severe infection. -Cultures taken -Started on IV cefepime -As needed bronchodilators -Continue oxygen supplementation and wean off as tolerated. -Continue as needed antipyretics and supportive care. -Per pneumonia protocol (strep pneumo antigen in urine, Legionella antigen, blood cultures and urine culture taken)  2-Hypertension -Overall stable -Continue current antihypertensive regimen.  3-Hyperlipidemia -Continue statins.  4-Hyponatremia -Chronic;  even a slightly worse (presentation -Provide gentle fluid resuscitation.  5-hx of other non-hemorrhagic Stroke [facility]) -Continue aspirin for secondary prevention risk factor modification. -No new focal neurologic deficits.  6-Hx of tracheostomy -Continue proper care respiratory  7-history of supraglottis malignancy -Actively receiving chemotherapy and radiation. -Continue patient follow-up with oncology service.  8-thrombocytopenia-appears to be close to baseline -Most likely associated with chemotherapy -No overt bleeding -Avoid the use of heparin products -SCDs for DVT prophylaxis.  9-Malnutrition of moderate degree -Continue appropriate nutrition and hydration through PEG tube -Osmolite has been ordered.  10-GERD -Continue Pepcid and PPI.  11-hypokalemia -potassium 3.4 -will replete and follow trend -check magnesium level  DVT prophylaxis: SCDs Code Status: Full code Family Communication: Wife at bedside. Disposition Plan: To be determined.  Hopefully discharge home once fever and breathing status controlled. Consults called: None Admission status: Inpatient, length of stay more than 2 midnights; MedSurg bed.   Time Spent: 70 minutes.  Barton Dubois MD [facility] Pager [phone]   04/08/2019, 6:11 PM

## 2019-04-08 NOTE — ED Triage Notes (Signed)
PT brought to the ED today by his wife for c/o body aches and fevers x3 days. PT states he last had tylenol at 0500 this am for fever of 100.6. PT states he is receiving chemo and is treated here at [facility]. PT has picc line in place to right upper arm upon ED arrival.

## 2019-04-08 NOTE — ED Notes (Signed)
Lab tech completing blood cultures.

## 2019-04-09 ENCOUNTER — Ambulatory Visit: Payer: 59

## 2019-04-09 ENCOUNTER — Inpatient Hospital Stay: Payer: 59 | Admitting: Nutrition

## 2019-04-09 DIAGNOSIS — J189 Pneumonia, unspecified organism: Secondary | ICD-10-CM | POA: Diagnosis not present

## 2019-04-09 LAB — BLOOD CULTURE ID PANEL (REFLEXED)
Acinetobacter baumannii: NOT DETECTED
Candida albicans: NOT DETECTED
Candida glabrata: NOT DETECTED
Candida krusei: NOT DETECTED
Candida parapsilosis: NOT DETECTED
Candida tropicalis: NOT DETECTED
Carbapenem resistance: NOT DETECTED
Enterobacter cloacae complex: NOT DETECTED
Enterobacteriaceae species: DETECTED — AB
Enterococcus species: NOT DETECTED
Escherichia coli: NOT DETECTED
Haemophilus influenzae: NOT DETECTED
Klebsiella oxytoca: NOT DETECTED
Klebsiella pneumoniae: DETECTED — AB
Listeria monocytogenes: NOT DETECTED
Neisseria meningitidis: NOT DETECTED
Proteus species: NOT DETECTED
Pseudomonas aeruginosa: NOT DETECTED
Serratia marcescens: DETECTED — AB
Staphylococcus aureus (BCID): NOT DETECTED
Staphylococcus species: NOT DETECTED
Streptococcus agalactiae: NOT DETECTED
Streptococcus pneumoniae: NOT DETECTED
Streptococcus pyogenes: NOT DETECTED
Streptococcus species: NOT DETECTED

## 2019-04-09 LAB — URINE CULTURE: Culture: NO GROWTH

## 2019-04-09 LAB — GLUCOSE, CAPILLARY: Glucose-Capillary: 135 mg/dL — ABNORMAL HIGH (ref 70–99)

## 2019-04-09 MED ORDER — FREE WATER
100.0000 mL | Freq: Four times a day (QID) | Status: DC
  Administered 2019-04-09 – 2019-04-14 (×18): 100 mL

## 2019-04-09 MED ORDER — ASPIRIN 81 MG PO CHEW
81.0000 mg | CHEWABLE_TABLET | Freq: Every day | ORAL | Status: DC
  Administered 2019-04-09 – 2019-04-14 (×6): 81 mg via ORAL
  Filled 2019-04-09 (×6): qty 1

## 2019-04-09 MED ORDER — FREE WATER: 200.0000 mL | Status: DC

## 2019-04-09 MED ORDER — CHLORHEXIDINE GLUCONATE CLOTH 2 % EX PADS
6.0000 | MEDICATED_PAD | Freq: Every day | CUTANEOUS | Status: DC
  Administered 2019-04-09 – 2019-04-13 (×5): 6 via TOPICAL

## 2019-04-09 MED ORDER — PANTOPRAZOLE SODIUM 40 MG PO PACK
40.0000 mg | PACK | Freq: Every day | ORAL | Status: DC
  Administered 2019-04-10 – 2019-04-13 (×4): 40 mg
  Filled 2019-04-09 (×5): qty 20

## 2019-04-09 MED ORDER — LOPERAMIDE HCL 2 MG PO CAPS
2.0000 mg | ORAL_CAPSULE | Freq: Once | ORAL | Status: AC
  Administered 2019-04-09: 2 mg via ORAL
  Filled 2019-04-09: qty 1

## 2019-04-09 MED ORDER — OSMOLITE 1.5 CAL PO LIQD
1000.0000 mL | ORAL | Status: DC
  Administered 2019-04-12: 1000 mL

## 2019-04-09 NOTE — Progress Notes (Signed)
Initial Nutrition Assessment  DOCUMENTATION CODES:      INTERVENTION:  Recommend resume bolus tube feeds: (237 ml) Osmolite 1.5 x 5/day (0930, 1230, 1530, 1830, 2130) with 30 ml prostat TID and 100 ml free water/bolus (50 ml before and 50 ml after). TF regimen provides: 2075 kcal (91% estimated kcal need), 119 grams protein, and 1405 ml free water.   NUTRITION DIAGNOSIS:  Moderate Malnutrition related to cancer and cancer related treatments as evidenced by mild fat depletion, mild muscle depletion, percent weight loss.  GOAL:   Patient will meet greater than or equal to 90% of their needs MONITOR:   TF tolerance, Labs, Weight trends REASON FOR ASSESSMENT:  (Home tube feeding)   ASSESSMENT: Patient is a 59 yo male with hx of malignant neoplasm of supraglottis s/p trach (passy muir speaking valve), dysphagia s/p PEG tube placement, CAD s/p PCI with stenting in 2014, CVA, chronic lower back pain, chronic anxiety/depression, essential HTN, and hyperlipidemia. Discharged from [facility] on 4/3. He is undergoing chemo and radiation therapy.  He presents c/o fever, chills and body aches. Moderate malnutrition-RD note 3/31.   His wife at bedside. Patient is NPO. 100% of nutrition through tube feeds. Home regimen order: (237 ml) Osmolite 1.5 x 5/day (0930, 1230, 1530, 1830, 2130) with 30 ml prostat TID and 100 ml free water/bolus (50 ml before and 50 ml after). TF regimen provides: 2077 kcal (91% estimated kcal need), 119 grams protein, and 1405 ml free water.  Talked with nursing. Currently pt receiving continuous Osmolite 1.5 @ 30 ml/hr -providing 1080 kcal, 45 g protein and 549 ml water every 24 hr. Meet 47% energy, 38% protein needs.   He has only been completing 3 bolus feedings daily due to conflict with his daily radiation treatment appointment. He is unable to take the feeding with him due to having to lie flat for his treatment. Its usually around 3 pm before he returns home and he administers  it then.  We talked about potentially starting his bolus earlier in the day in order to get 2 in before going for radiotherapy. Emphasized to him the importance of being consistent with compliance to nutrition regimen as ordered - due to his increased energy and protein needs especially while undergoing cancer treatment. Followed by RD through [facility].     4/6 wt.- 167 lb (75.9 kg)-stable the past week. 3/30 was 167 lb and weight on 3/2 was 179 lb. This indicates 12 lb weight loss (6.7% body weight) in the past month; significant for time frame.   Medications reviewed: Protonix IVF-NS@75  ml/hr  Labs: Sodium 126 (L), potassium 3.4 (L), Glucose 152.   NUTRITION - FOCUSED PHYSICAL EXAM: Mild temporal muscle loss, moderate patellar, mild buccal, upper arm fat loss. No edema.   Diet Order:   Diet Order            Diet NPO time specified  Diet effective now              EDUCATION NEEDS:   Education needs have been addressed Skin:  Skin Assessment: Reviewed RN Assessment(G-tube- LLQ)  Last BM:  4/5  Height:   Ht Readings from Last 1 Encounters:  04/08/19 6' (1.829 m)    Weight:   Wt Readings from Last 1 Encounters:  04/08/19 75.9 kg    Ideal Body Weight:   81 kg  BMI:  Body mass index is 22.69 kg/m.  Estimated Nutritional Needs:   Kcal:   2200-2500  Protein:   118-132  gr  Fluid:   > 2.2 liters daily   Colman Cater MS,RD,CSG,LDN Contact available -AMION

## 2019-04-09 NOTE — Progress Notes (Signed)
Pt is refusing ATC and multiple offers to suction. Pt states that he has a productive cough and uses the suction provided at bedside with yankauer. Pt has a PMV attached to his trach flange. He removes and replaces this as needed. O2 saturations are 98% on RA. #6 shiley cfls secured and inplace. Trach care performed this am without incident. Rescue supplies at [facility]

## 2019-04-09 NOTE — Progress Notes (Signed)
Patient Demographics:    Rodriques Badie, is a 59 y.o. male, DOB - [birth year], WGN:[id number]  Admit date - 04/08/2019   Admitting Physician Barton Dubois, MD  Outpatient Primary MD for the patient is [name], MD  LOS - 1   Chief Complaint  Patient presents with  . Fever        Subjective:    [name] today has no emesis,  No chest pain,   --Patient's wife is at bedside, questions answered -Complains of loose stools -Cough and shortness of breath persist -T-max above 101  Assessment  & Plan :    Principal Problem:   Febrile illness, acute Active Problems:   Hypertension   Hyperlipidemia   Hyponatremia   Stroke ([facility] [place])   Hx of tracheostomy   Malnutrition of moderate degree  Brief Summary:- 59 y.o. male with past medical history significant for malignant neoplasm of supraglottis, status post tracheostomy, actively receiving chemotherapy and radiation; dysphagia status post PEG tube placement, history of coronary artery disease a status post PCI with stenting in 2014, history of nonhemorrhagic CVA, chronic lower back pain, essential hypertension, hyperlipidemia, recent admission secondary to acute blood loss anemia presumed to be secondary to upper GI bleed (04/01/19--04/05/19); who presented to the emergency department secondary to worsening shortness of breath, generalized weakness and productive cough.  Patient also expressed having fevers at home and not feeling well.  Denies chest pain, no nausea, no vomiting, no dysuria, no hematuria, no melena, no hematochezia, no focal deficits or any other complaints.  In the ED work-up demonstrated chest x-ray at with right lower lobe atelectasis, temperature of 101.6 and hyponatremia (slightly worse than his baseline) and thrombocytopenia.  Initially was a slightly tachypneic with subsequent improvement by time of my examination.  2 L of oxygen  supplementation in place.  Cultures taken, antibiotics initiated and TRH contacted 20 patient for further evaluation and management.  There is concern for HCAP as reason for his fevers.  A/p 1) bacteremia and sepsis secondary to HCAP-blood cultures with Klebsiella and Serratia marcescens- continue cefepime pending sensitivity -Patient is status post prior tracheostomy -Continue bronchodilators and humidifier to trach -Patient is immunocompromise due to ongoing chemo/radiation therapy -Patient has a right arm PICC line that probably will have to come out  2) history of malignancy of the supraglottis--- currently undergoing chemo and radiation  3)/FEN/Tube Feeds--- PTA he was on Osmolite 1.5cal 42fl oz he takes 3-4x/day and is supposed to work up to 7x/day and prosource 15g protein -Dietitian consult with recommendations appreciated for tube feeds via PEG tube  4) history of prior stroke--- continue aspirin and statin  5)Hyponatremai--largely asymptomatic, water flushes via PEG tube adjusted  Disposition/Need for in-Hospital Stay- patient unable to be discharged at this time due to --pneumonia and immunocompromise patients requiring IV antibiotics pending blood culture sensitivities -Patient From: From home with wife D/C Place: Back home with wife Barriers: Not Clinically Stable-immunocompromised individual with bacteremia and HCAP requiring IV antibiotics pending blood culture and sensitivity  Code Status : Full code  Family Communication:     (patient is alert, awake and coherent) -Discussed with patient's wife at bedside  Consults  : na  DVT Prophylaxis  :   - SCDs--thrombocytopenia  Lab Results  Component Value Date   PLT 125 (L) 04/08/2019    Inpatient Medications  Scheduled Meds: . aspirin  81 mg Oral Daily  . atorvastatin  80 mg Oral QPM  . Chlorhexidine Gluconate Cloth  6 each Topical Daily  . feeding supplement (OSMOLITE 1.5 CAL)  1,000 mL Per Tube Q6H  . feeding  supplement (PRO-STAT SUGAR FREE 64)  30 mL Per Tube QHS  . [START ON 04/10/2019] free water  100 mL Per Tube Q6H  . lidocaine  15 mL Mouth/Throat TID AC & HS  . pantoprazole sodium  40 mg Per Tube Daily   Continuous Infusions: . sodium chloride 75 mL/hr at 04/09/19 1030  . ceFEPime (MAXIPIME) IV 2 g (04/09/19 1316)  . feeding supplement (OSMOLITE 1.5 CAL) 30 mL/hr at 04/09/19 1030   PRN Meds:.acetaminophen **OR** acetaminophen, HYDROcodone-acetaminophen, ipratropium-albuterol, nitroGLYCERIN, ondansetron **OR** ondansetron (ZOFRAN) IV    Anti-infectives (From admission, onward)   Start     Dose/Rate Route Frequency Ordered Stop   04/08/19 2200  ceFEPIme (MAXIPIME) 2 g in sodium chloride 0.9 % 100 mL IVPB     2 g 200 mL/hr over 30 Minutes Intravenous Every 8 hours 04/08/19 1821     04/08/19 1300  ceFEPIme (MAXIPIME) 2 g in sodium chloride 0.9 % 100 mL IVPB     2 g 200 mL/hr over 30 Minutes Intravenous  Once 04/08/19 1258 04/08/19 1400        Objective:   Vitals:   04/09/19 1500 04/09/19 1615 04/09/19 1700 04/09/19 1944  BP:   134/86   Pulse:   67 71  Resp:   16 16  Temp:   98.3 F (36.8 C)   TempSrc:   Oral   SpO2: 98% 98% 97% 97%  Weight:      Height:        Wt Readings from Last 3 Encounters:  04/08/19 75.9 kg  04/01/19 76 kg  03/27/19 76 kg     Intake/Output Summary (Last 24 hours) at 04/09/2019 2001 Last data filed at 04/09/2019 0900 Gross per 24 hour  Intake 687.25 ml  Output 400 ml  Net 287.25 ml     Physical Exam  Gen:- Awake Alert, resting comfortably HEENT:- [place].AT, No sclera icterus Neck-tracheostomy  lungs-diminished breath sounds with scattered rhonchi CV- S1, S2 normal, regular  Abd-  +ve B.Sounds, Abd Soft, No tenderness, PEG tube site is clean dry and intact Extremity/Skin:- No  edema, pedal pulses present  Psych-affect is appropriate, oriented x3 Neuro-no new focal deficits, no tremors MSK-right arm PICC line   Data Review:   Micro  Results Recent Results (from the past 240 hour(s))  SARS CORONAVIRUS 2 (TAT 6-24 HRS) Nasopharyngeal Nasopharyngeal Swab     Status: None   Collection Time: 04/01/19  6:13 PM   Specimen: Nasopharyngeal Swab  Result Value Ref Range Status   SARS Coronavirus 2 NEGATIVE NEGATIVE Final    Comment: (NOTE) SARS-CoV-2 target nucleic acids are NOT DETECTED. The SARS-CoV-2 RNA is generally detectable in upper and lower respiratory specimens during the acute phase of infection. Negative results do not preclude SARS-CoV-2 infection, do not rule out co-infections with other pathogens, and should not be used as the sole basis for treatment or other patient management decisions. Negative results must be combined with clinical observations, patient history, and epidemiological information. The expected result is Negative. Fact Sheet for Patients: SugarRoll.be Fact Sheet for Healthcare Providers: https://www.woods-mathews.com/ This test is not yet approved or cleared by the [place] FDA  and  has been authorized for detection and/or diagnosis of SARS-CoV-2 by FDA under an Emergency Use Authorization (EUA). This EUA will remain  in effect (meaning this test can be used) for the duration of the COVID-19 declaration under Section 56 4(b)(1) of the Act, 21 U.S.C. section 360bbb-3(b)(1), unless the authorization is terminated or revoked sooner. Performed at [facility], [place] [address] [place], [place] [address]   Urine culture     Status: None   Collection Time: 04/08/19 12:58 PM   Specimen: In/Out Cath Urine  Result Value Ref Range Status   Specimen Description   Final    IN/OUT CATH URINE Performed at [facility], [address] [place], [place] [address]    Special Requests   Final    NONE Performed at [facility], [address] [place], [place] [address]    Culture   Final    NO GROWTH Performed at [facility], [place] [address], [place], [place] [address]    Report Status 04/09/2019 FINAL  Final  Blood Culture (routine x 2)     Status: None (Preliminary result)   Collection Time: 04/08/19  1:09 PM   Specimen: BLOOD  Result Value Ref Range Status   Specimen Description BLOOD LEFT ANTECUBITAL  Final   Special Requests Blood Culture adequate volume  Final   Culture  Setup Time   Final    IN BOTH AEROBIC AND ANAEROBIC BOTTLES GRAM NEGATIVE RODS Gram Stain Report Called to,Read Back By and Verified With: A MUHAMMAD,RN @0128  04/09/19 MKELLY    Culture   Final    NO GROWTH < 24 HOURS Performed at [facility], [address], [place], [place] [address]    Report Status PENDING  Incomplete  Blood Culture (routine x 2)     Status: None (Preliminary result)   Collection Time: 04/08/19  1:09 PM   Specimen: BLOOD  Result Value Ref Range Status   Specimen Description   Final    BLOOD PICC LINE DRAWN BY RN Performed at [facility], [address] [place], [place] [address]    Special Requests   Final    Blood Culture results may not be optimal due to an inadequate volume of blood received in culture bottles Performed at [facility], [address] [place], [place] [address]    Culture  Setup Time   Final    IN BOTH AEROBIC AND ANAEROBIC BOTTLES GRAM NEGATIVE RODS Gram Stain Report Called to,Read Back By and Verified With: A MUHAMMAD,RN @0130  04/09/19 MKELLY CRITICAL RESULT CALLED TO, READ BACK BY AND VERIFIED WITH: PHATMD J MINDENHALL [id number] AT 724 AM [place] CM Performed at [facility], [place] [address] [place], [place] [address]    Culture GRAM NEGATIVE RODS  Final   Report Status PENDING  Incomplete  Blood Culture ID Panel (Reflexed)     Status: Abnormal   Collection Time: 04/08/19  1:09 PM  Result Value Ref Range Status   Enterococcus species NOT DETECTED NOT DETECTED Final   Listeria monocytogenes NOT DETECTED NOT DETECTED Final   Staphylococcus species NOT DETECTED NOT DETECTED Final   Staphylococcus  aureus (BCID) NOT DETECTED NOT DETECTED Final   Streptococcus species NOT DETECTED NOT DETECTED Final   Streptococcus agalactiae NOT DETECTED NOT DETECTED Final   Streptococcus pneumoniae NOT DETECTED NOT DETECTED Final   Streptococcus pyogenes NOT DETECTED NOT DETECTED Final   Acinetobacter baumannii NOT DETECTED NOT DETECTED Final   Enterobacteriaceae species  DETECTED (A) NOT DETECTED Final    Comment: CRITICAL RESULT CALLED TO, READ BACK BY AND VERIFIED WITH: PHARMD J MINDENHALL [id number] AT 7241 AM BY CM    Enterobacter cloacae complex NOT DETECTED NOT DETECTED Final   Escherichia coli NOT DETECTED NOT DETECTED Final   Klebsiella oxytoca NOT DETECTED NOT DETECTED Final   Klebsiella pneumoniae DETECTED (A) NOT DETECTED Final    Comment: CRITICAL RESULT CALLED TO, READ BACK BY AND VERIFIED WITH: PHARMD J MINDENHALL [id number] AT 0724 BY CM    Proteus species NOT DETECTED NOT DETECTED Final   Serratia marcescens DETECTED (A) NOT DETECTED Final    Comment: CRITICAL RESULT CALLED TO, READ BACK BY AND VERIFIED WITH: PHARMD J MINDENHALL [id number] AT 724 AM BY CM    Carbapenem resistance NOT DETECTED NOT DETECTED Final   Haemophilus influenzae NOT DETECTED NOT DETECTED Final   Neisseria meningitidis NOT DETECTED NOT DETECTED Final   Pseudomonas aeruginosa NOT DETECTED NOT DETECTED Final   Candida albicans NOT DETECTED NOT DETECTED Final   Candida glabrata NOT DETECTED NOT DETECTED Final   Candida krusei NOT DETECTED NOT DETECTED Final   Candida parapsilosis NOT DETECTED NOT DETECTED Final   Candida tropicalis NOT DETECTED NOT DETECTED Final    Comment: Performed at [facility], [place] [address], [place], [place] [address]  Respiratory Panel by RT PCR (Flu A&B, Covid) - Nasopharyngeal Swab     Status: None   Collection Time: 04/08/19  6:14 PM   Specimen: Nasopharyngeal Swab  Result Value Ref Range Status   SARS Coronavirus 2 by RT PCR NEGATIVE NEGATIVE Final    Comment: (NOTE) SARS-CoV-2  target nucleic acids are NOT DETECTED. The SARS-CoV-2 RNA is generally detectable in upper respiratoy specimens during the acute phase of infection. The lowest concentration of SARS-CoV-2 viral copies this assay can detect is 131 copies/mL. A negative result does not preclude SARS-Cov-2 infection and should not be used as the sole basis for treatment or other patient management decisions. A negative result may occur with  improper specimen collection/handling, submission of specimen other than nasopharyngeal swab, presence of viral mutation(s) within the areas targeted by this assay, and inadequate number of viral copies (<131 copies/mL). A negative result must be combined with clinical observations, patient history, and epidemiological information. The expected result is Negative. Fact Sheet for Patients:  PinkCheek.be Fact Sheet for Healthcare Providers:  GravelBags.it This test is not yet ap proved or cleared by the [place] FDA and  has been authorized for detection and/or diagnosis of SARS-CoV-2 by FDA under an Emergency Use Authorization (EUA). This EUA will remain  in effect (meaning this test can be used) for the duration of the COVID-19 declaration under Section 564(b)(1) of the Act, 21 U.S.C. section 360bbb-3(b)(1), unless the authorization is terminated or revoked sooner.    Influenza A by PCR NEGATIVE NEGATIVE Final   Influenza B by PCR NEGATIVE NEGATIVE Final    Comment: (NOTE) The Xpert Xpress SARS-CoV-2/FLU/RSV assay is intended as an aid in  the diagnosis of influenza from Nasopharyngeal swab specimens and  should not be used as a sole basis for treatment. Nasal washings and  aspirates are unacceptable for Xpert Xpress SARS-CoV-2/FLU/RSV  testing. Fact Sheet for Patients: PinkCheek.be Fact Sheet for Healthcare Providers: GravelBags.it This test is  not yet approved or cleared by the [place] FDA and  has been authorized for detection and/or diagnosis of SARS-CoV-2 by  FDA under an Emergency Use Authorization (EUA). This EUA will  remain  in effect (meaning this test can be used) for the duration of the  Covid-19 declaration under Section 564(b)(1) of the Act, 21  U.S.C. section 360bbb-3(b)(1), unless the authorization is  terminated or revoked. Performed at [facility], [place] [address] [place], [place] [address]     Radiology Reports DG Chest [place] 1 View  Result Date: 04/08/2019 CLINICAL DATA:  Hypoxia. Fever. EXAM: PORTABLE CHEST 1 VIEW COMPARISON:  04/01/2019 FINDINGS: Tracheostomy tube in good position. PICC tip is in good position just below the carina in the superior vena cava. The heart size is normal. Slight pulmonary vascular prominence without pulmonary edema. No effusions. No acute bone abnormality. IMPRESSION: Slight pulmonary vascular prominence. Electronically Signed   By: Lorriane Shire M.D.   On: 04/08/2019 13:35   DG Chest Port 1 View  Result Date: 04/01/2019 CLINICAL DATA:  Syncope EXAM: PORTABLE CHEST 1 VIEW COMPARISON:  03/17/2019 FINDINGS: Two frontal views of the chest demonstrates stable tracheostomy tube and right-sided PICC. Cardiac silhouette is unremarkable. No airspace disease, effusion, or pneumothorax. No acute bony abnormalities. IMPRESSION: 1. No acute intrathoracic process. Electronically Signed   By: Randa Ngo M.D.   On: 04/01/2019 15:04   DG Chest Portable 1 View  Result Date: 03/17/2019 CLINICAL DATA:  Fever. EXAM: PORTABLE CHEST 1 VIEW COMPARISON:  Radiograph 03/04/2019 FINDINGS: Tracheostomy tube tip at the thoracic inlet. Right upper extremity central line tip in the SVC. Streaky opacities at the right lung base are new from prior exam. Unchanged heart size and mediastinal contours. No pulmonary edema or pneumothorax. No large pleural effusion. No acute osseous abnormalities are seen.  IMPRESSION: Streaky right lung base opacities, new from radiographs earlier this month, favoring atelectasis but could represent pneumonia in the setting of fever. Electronically Signed   By: Keith Rake M.D.   On: 03/17/2019 15:54   ECHOCARDIOGRAM COMPLETE  Result Date: 04/03/2019    ECHOCARDIOGRAM REPORT   Patient Name:   MAMORU TAKESHITA Date of Exam: 04/03/2019 Medical Rec #:  [id number]     Height:       72.0 in Accession #:    [id number]    Weight:       167.5 lb Date of Birth:  January 24, 1960     BSA:          1.976 m Patient Age:    70 years      BP:           130/92 mmHg Patient Gender: M             HR:           74 bpm. Exam Location:  Inpatient Procedure: 2D Echo, Cardiac Doppler and Color Doppler Indications:    R55 Syncope  History:        Patient has prior history of Echocardiogram examinations, most                 recent 08/01/2018. CAD, Stroke; Risk Factors:Hypertension,                 Dyslipidemia and GERD. Cancer.  Sonographer:    Tiffany Dance Referring Phys: [id number] [place]  1. Normal LV systolic function; grade 1 diastolic dysfunction; mild LVH; moderately to severely dilated aortic root; suggest CTA or MRA to further assess; trace AI.  2. Left ventricular ejection fraction, by estimation, is 55 to 60%. The left ventricle has normal function. The left ventricle has no regional wall motion abnormalities.  There is mild left ventricular hypertrophy. Left ventricular diastolic parameters are consistent with Grade I diastolic dysfunction (impaired relaxation).  3. Right ventricular systolic function is normal. The right ventricular size is normal. There is mildly elevated pulmonary artery systolic pressure.  4. The mitral valve is normal in structure. Trivial mitral valve regurgitation. No evidence of mitral stenosis.  5. The aortic valve is tricuspid. Aortic valve regurgitation is trivial. No aortic stenosis is present.  6. Aortic dilatation noted. There is moderate to severe  dilatation of the aortic root measuring 48 mm.  7. The inferior vena cava is dilated in size with >50% respiratory variability, suggesting right atrial pressure of 8 mmHg. FINDINGS  Left Ventricle: Left ventricular ejection fraction, by estimation, is 55 to 60%. The left ventricle has normal function. The left ventricle has no regional wall motion abnormalities. The left ventricular internal cavity size was normal in size. There is  mild left ventricular hypertrophy. Left ventricular diastolic parameters are consistent with Grade I diastolic dysfunction (impaired relaxation). Right Ventricle: The right ventricular size is normal. Right ventricular systolic function is normal. There is mildly elevated pulmonary artery systolic pressure. The tricuspid regurgitant velocity is 2.52 m/s, and with an assumed right atrial pressure of 8 mmHg, the estimated right ventricular systolic pressure is 16.1 mmHg. Left Atrium: Left atrial size was normal in size. Right Atrium: Right atrial size was normal in size. Pericardium: There is no evidence of pericardial effusion. Mitral Valve: The mitral valve is normal in structure. Normal mobility of the mitral valve leaflets. Trivial mitral valve regurgitation. No evidence of mitral valve stenosis. Tricuspid Valve: The tricuspid valve is normal in structure. Tricuspid valve regurgitation is mild . No evidence of tricuspid stenosis. Aortic Valve: The aortic valve is tricuspid. Aortic valve regurgitation is trivial. Aortic regurgitation PHT measures 400 msec. No aortic stenosis is present. Pulmonic Valve: The pulmonic valve was normal in structure. Pulmonic valve regurgitation is trivial. No evidence of pulmonic stenosis. Aorta: The aortic root is normal in size and structure and aortic dilatation noted. There is moderate to severe dilatation of the aortic root measuring 48 mm. Venous: The inferior vena cava is dilated in size with greater than 50% respiratory variability, suggesting right  atrial pressure of 8 mmHg. IAS/Shunts: No atrial level shunt detected by color flow Doppler. Additional Comments: Normal LV systolic function; grade 1 diastolic dysfunction; mild LVH; moderately to severely dilated aortic root; suggest CTA or MRA to further assess; trace AI.  LEFT VENTRICLE PLAX 2D LVIDd:         4.10 cm  Diastology LVIDs:         2.60 cm  LV e' lateral:   7.83 cm/s LV PW:         1.50 cm  LV E/e' lateral: 7.1 LV IVS:        1.30 cm  LV e' medial:    6.74 cm/s LVOT diam:     2.30 cm  LV E/e' medial:  8.2 LV SV:         72 LV SV Index:   36 LVOT Area:     4.15 cm  RIGHT VENTRICLE             IVC RV Basal diam:  2.80 cm     IVC diam: 2.40 cm RV S prime:     10.00 cm/s TAPSE (M-mode): 1.6 cm LEFT ATRIUM             Index  RIGHT ATRIUM           Index LA diam:        4.30 cm 2.18 cm/m  RA Area:     15.30 cm LA Vol (A2C):   50.6 ml 25.61 ml/m RA Volume:   36.70 ml  18.57 ml/m LA Vol (A4C):   36.2 ml 18.32 ml/m LA Biplane Vol: 44.3 ml 22.42 ml/m  AORTIC VALVE LVOT Vmax:   103.00 cm/s LVOT Vmean:  57.200 cm/s LVOT VTI:    0.173 m AI PHT:      400 msec  AORTA Ao Root diam: 4.80 cm Ao Asc diam:  3.40 cm MITRAL VALVE               TRICUSPID VALVE MV Area (PHT): 2.91 cm    TR Peak grad:   25.4 mmHg MV Decel Time: 261 msec    TR Vmax:        252.00 cm/s MV E velocity: 55.50 cm/s MV A velocity: 74.40 cm/s  SHUNTS MV E/A ratio:  0.75        Systemic VTI:  0.17 m                            Systemic Diam: 2.30 cm Kirk Ruths MD Electronically signed by Kirk Ruths MD Signature Date/Time: 04/03/2019/3:11:19 PM    Final      CBC Recent Labs  Lab 04/03/19 0542 04/04/19 0454 04/05/19 0347 04/08/19 1309  WBC 3.9* 3.7* 5.7 4.2  HGB 7.8* 7.4* 9.2* 9.4*  HCT 23.6* 22.6* 27.8* 28.4*  PLT 131* 151 183 125*  MCV 97.9 96.6 94.9 94.4  MCH 32.4 31.6 31.4 31.2  MCHC 33.1 32.7 33.1 33.1  RDW 15.2 14.9 14.9 15.7*  LYMPHSABS  --   --   --  0.1*  MONOABS  --   --   --  0.2  EOSABS  --   --   --   0.0  BASOSABS  --   --   --  0.0    Chemistries  Recent Labs  Lab 04/03/19 0542 04/04/19 0454 04/05/19 0347 04/08/19 1309 04/08/19 1827  NA 132* 132* 130* 126*  --   K 4.4 4.0 3.4* 3.4*  --   CL 101 99 95* 94*  --   CO2 24 27 26 23   --   GLUCOSE 114* 110* 92 152*  --   BUN 22* 21* 13 16  --   CREATININE 0.81 0.70 0.69 0.95  --   CALCIUM 8.2* 7.9* 8.3* 7.7*  --   MG  --   --   --   --  1.6*  AST  --   --   --  20  --   ALT  --   --   --  16  --   ALKPHOS  --   --   --  47  --   BILITOT  --   --   --  0.7  --    ------------------------------------------------------------------------------------------------------------------ No results for input(s): CHOL, HDL, LDLCALC, TRIG, CHOLHDL, LDLDIRECT in the last 72 hours.  Lab Results  Component Value Date   HGBA1C 5.4 08/01/2018   ------------------------------------------------------------------------------------------------------------------ No results for input(s): TSH, T4TOTAL, T3FREE, THYROIDAB in the last 72 hours.  Invalid input(s): FREET3 ------------------------------------------------------------------------------------------------------------------ No results for input(s): VITAMINB12, FOLATE, FERRITIN, TIBC, IRON, RETICCTPCT in the last 72 hours.  Coagulation profile Recent Labs  Lab 04/08/19 1309  INR 1.1  No results for input(s): DDIMER in the last 72 hours.  Cardiac Enzymes No results for input(s): CKMB, TROPONINI, MYOGLOBIN in the last 168 hours.  Invalid input(s): CK ------------------------------------------------------------------------------------------------------------------ No results found for: BNP   Roxan Hockey M.D on 04/09/2019 at 8:01 PM  Go to [email or web address] - for contact info  [facility] - Office  [phone]

## 2019-04-10 ENCOUNTER — Ambulatory Visit (HOSPITAL_COMMUNITY): Payer: 59

## 2019-04-10 ENCOUNTER — Ambulatory Visit (HOSPITAL_COMMUNITY): Payer: 59 | Admitting: Hematology

## 2019-04-10 ENCOUNTER — Other Ambulatory Visit (HOSPITAL_COMMUNITY): Payer: 59

## 2019-04-10 ENCOUNTER — Ambulatory Visit: Payer: 59

## 2019-04-10 DIAGNOSIS — J189 Pneumonia, unspecified organism: Secondary | ICD-10-CM | POA: Diagnosis not present

## 2019-04-10 LAB — BASIC METABOLIC PANEL
Anion gap: 6 (ref 5–15)
BUN: 18 mg/dL (ref 6–20)
CO2: 27 mmol/L (ref 22–32)
Calcium: 8.1 mg/dL — ABNORMAL LOW (ref 8.9–10.3)
Chloride: 102 mmol/L (ref 98–111)
Creatinine, Ser: 0.63 mg/dL (ref 0.61–1.24)
GFR calc Af Amer: >60 mL/min (ref >60)
GFR calc non Af Amer: >60 mL/min (ref >60)
Glucose, Bld: 114 mg/dL — ABNORMAL HIGH (ref 70–99)
Potassium: 3.7 mmol/L (ref 3.5–5.1)
Sodium: 135 mmol/L (ref 135–145)

## 2019-04-10 LAB — CBC
HCT: 25.9 % — ABNORMAL LOW (ref 39.0–52.0)
Hemoglobin: 8.2 g/dL — ABNORMAL LOW (ref 13.0–17.0)
MCH: 30.7 pg (ref 26.0–34.0)
MCHC: 31.7 g/dL (ref 30.0–36.0)
MCV: 97 fL (ref 80.0–100.0)
Platelets: 122 10*3/uL — ABNORMAL LOW (ref 150–400)
RBC: 2.67 MIL/uL — ABNORMAL LOW (ref 4.22–5.81)
RDW: 15.9 % — ABNORMAL HIGH (ref 11.5–15.5)
WBC: 3.6 10*3/uL — ABNORMAL LOW (ref 4.0–10.5)
nRBC: 0 % (ref 0.0–0.2)

## 2019-04-10 MED ORDER — TRAZODONE HCL 50 MG PO TABS
100.0000 mg | ORAL_TABLET | Freq: Every day | ORAL | Status: DC
  Administered 2019-04-10 – 2019-04-13 (×4): 100 mg via ORAL
  Filled 2019-04-10 (×4): qty 2

## 2019-04-10 NOTE — Progress Notes (Signed)
Patient Demographics:    [name], is a 59 y.o. male, DOB - January 13, 1960, EYC:[id number]  Admit date - 04/08/2019   Admitting Physician Jorge Dubois, MD  Outpatient Primary MD for the patient is Jorge School, MD  LOS - 2   Chief Complaint  Patient presents with  . Fever        Subjective:    [name] today has no emesis,  No chest pain,    -Frequency and consistency of stool is improved -Wife at bedside, questions answered -Tolerating tube feeding okay  Assessment  & Plan :    Principal Problem:   HCAP (healthcare-associated pneumonia) Active Problems:   Hypertension   Alcohol abuse, daily use   Hyperlipidemia   Hyponatremia   Stroke [facility])   Malignant neoplasm of supraglottis ([place])   Hx of tracheostomy   Head and neck cancer (HCC)   Malnutrition of moderate degree   Febrile illness, acute  Brief Summary:- 59 y.o. male with past medical history significant for malignant neoplasm of supraglottis, status post tracheostomy, actively receiving chemotherapy and radiation; dysphagia status post PEG tube placement, history of coronary artery disease a status post PCI with stenting in 2014, history of nonhemorrhagic CVA, chronic lower back pain, essential hypertension, hyperlipidemia, recent admission secondary to acute blood loss anemia presumed to be secondary to upper GI bleed (04/01/19--04/05/19); who presented to the emergency department secondary to worsening shortness of breath, generalized weakness and productive cough.  Patient also expressed having fevers at home and not feeling well.  Denies chest pain, no nausea, no vomiting, no dysuria, no hematuria, no melena, no hematochezia, no focal deficits or any other complaints.  In the ED work-up demonstrated chest x-ray at with right lower lobe atelectasis, temperature of 101.6 and hyponatremia (slightly worse than his baseline) and  thrombocytopenia.  Initially was a slightly tachypneic with subsequent improvement by time of my examination.  2 L of oxygen supplementation in place.  Cultures taken, antibiotics initiated and TRH contacted 20 patient for further evaluation and management.  There is concern for HCAP as reason for his fevers.  A/p 1) bacteremia and sepsis secondary to HCAP-blood cultures with Klebsiella and Serratia marcescens- continue cefepime pending sensitivity -Patient is status post prior tracheostomy -Continue bronchodilators and humidifier to trach -Patient is immunocompromise due to ongoing chemo/radiation therapy -Patient has a right arm PICC line that probably will have to come out  2) history of malignancy of the supraglottis--- currently undergoing chemo and radiation -Pancytopenia noted in the setting of chemoradiation therapy  3)/FEN/Tube Feeds--- PTA he was on Osmolite 1.5cal 77fl oz he takes 3-4x/day and is supposed to work up to 7x/day and prosource 15g protein -Dietitian consult with recommendations appreciated for tube feeds via PEG tube  4) history of prior stroke--- continue aspirin and Lipitor 5)Hyponatremia--largely asymptomatic, sodium is up to 135 after water flushes via PEG tube as adjusted -Decrease NS to 50 mL an hour  Disposition/Need for in-Hospital Stay- patient unable to be discharged at this time due to --pneumonia and immunocompromise patients requiring IV antibiotics pending blood culture sensitivities -Patient From: From home with wife D/C Place: Back home with wife Barriers: Not Clinically Stable-immunocompromised individual with bacteremia and HCAP requiring IV antibiotics pending blood culture and sensitivity  Code Status : Full code  Family Communication:     (patient is alert, awake and coherent) -Discussed with patient's wife at bedside  Consults  : na  DVT Prophylaxis  :   - SCDs--thrombocytopenia  Lab Results  Component Value Date   PLT 122 (L) 04/10/2019     Inpatient Medications  Scheduled Meds: . aspirin  81 mg Oral Daily  . atorvastatin  80 mg Oral QPM  . Chlorhexidine Gluconate Cloth  6 each Topical Daily  . feeding supplement (OSMOLITE 1.5 CAL)  1,000 mL Per Tube Q6H  . feeding supplement (PRO-STAT SUGAR FREE 64)  30 mL Per Tube QHS  . free water  100 mL Per Tube Q6H  . lidocaine  15 mL Mouth/Throat TID AC & HS  . pantoprazole sodium  40 mg Per Tube Daily  . traZODone  100 mg Oral QHS   Continuous Infusions: . sodium chloride 75 mL/hr at 04/10/19 1339  . ceFEPime (MAXIPIME) IV 2 g (04/10/19 1336)  . feeding supplement (OSMOLITE 1.5 CAL) 30 mL/hr at 04/09/19 1030   PRN Meds:.acetaminophen **OR** acetaminophen, HYDROcodone-acetaminophen, ipratropium-albuterol, nitroGLYCERIN, ondansetron **OR** ondansetron (ZOFRAN) IV    Anti-infectives (From admission, onward)   Start     Dose/Rate Route Frequency Ordered Stop   04/08/19 2200  ceFEPIme (MAXIPIME) 2 g in sodium chloride 0.9 % 100 mL IVPB     2 g 200 mL/hr over 30 Minutes Intravenous Every 8 hours 04/08/19 1821     04/08/19 1300  ceFEPIme (MAXIPIME) 2 g in sodium chloride 0.9 % 100 mL IVPB     2 g 200 mL/hr over 30 Minutes Intravenous  Once 04/08/19 1258 04/08/19 1400        Objective:   Vitals:   04/10/19 0300 04/10/19 0432 04/10/19 1439 04/10/19 1600  BP:  (!) 142/88 (!) 160/90   Pulse: 62 73 73   Resp: 18 15 16    Temp:  97.8 F (36.6 C)    TempSrc:  Oral    SpO2: 93% 100% 96% 100%  Weight:      Height:        Wt Readings from Last 3 Encounters:  04/08/19 75.9 kg  04/01/19 76 kg  03/27/19 76 kg     Intake/Output Summary (Last 24 hours) at 04/10/2019 1843 Last data filed at 04/10/2019 1800 Gross per 24 hour  Intake 120 ml  Output 600 ml  Net -480 ml     Physical Exam  Gen:- Awake Alert, resting comfortably HEENT:- [place].AT, No sclera icterus Neck-tracheostomy  lungs-diminished breath sounds with scattered rhonchi CV- S1, S2 normal, regular  Abd-   +ve B.Sounds, Abd Soft, No tenderness, PEG tube site is clean dry and intact Extremity/Skin:- No  edema, pedal pulses present  Psych-affect is appropriate, oriented x3 Neuro-no new focal deficits, no tremors MSK-right arm PICC line   Data Review:   Micro Results Recent Results (from the past 240 hour(s))  SARS CORONAVIRUS 2 (TAT 6-24 HRS) Nasopharyngeal Nasopharyngeal Swab     Status: None   Collection Time: 04/01/19  6:13 PM   Specimen: Nasopharyngeal Swab  Result Value Ref Range Status   SARS Coronavirus 2 NEGATIVE NEGATIVE Final    Comment: (NOTE) SARS-CoV-2 target nucleic acids are NOT DETECTED. The SARS-CoV-2 RNA is generally detectable in upper and lower respiratory specimens during the acute phase of infection. Negative results do not preclude SARS-CoV-2 infection, do not rule out co-infections with other pathogens, and should not be used as the sole basis  for treatment or other patient management decisions. Negative results must be combined with clinical observations, patient history, and epidemiological information. The expected result is Negative. Fact Sheet for Patients: SugarRoll.be Fact Sheet for Healthcare Providers: https://www.woods-mathews.com/ This test is not yet approved or cleared by the [place] FDA and  has been authorized for detection and/or diagnosis of SARS-CoV-2 by FDA under an Emergency Use Authorization (EUA). This EUA will remain  in effect (meaning this test can be used) for the duration of the COVID-19 declaration under Section 56 4(b)(1) of the Act, 21 U.S.C. section 360bbb-3(b)(1), unless the authorization is terminated or revoked sooner. Performed at [facility], [place] [address], [place], [place] [address]   Urine culture     Status: None   Collection Time: 04/08/19 12:58 PM   Specimen: In/Out Cath Urine  Result Value Ref Range Status   Specimen Description   Final    IN/OUT CATH  URINE Performed at [facility], [address] [place], [place] [address]    Special Requests   Final    NONE Performed at [facility], [name], [place], [place] [address]    Culture   Final    NO GROWTH Performed at [facility], [place] [address] [place], [place] [address]    Report Status 04/09/2019 FINAL  Final  Blood Culture (routine x 2)     Status: Abnormal (Preliminary result)   Collection Time: 04/08/19  1:09 PM   Specimen: BLOOD  Result Value Ref Range Status   Specimen Description   Final    BLOOD LEFT ANTECUBITAL Performed at [facility], [address], [place], [place] [address]    Special Requests   Final    Blood Culture adequate volume Performed at [facility], [address], [place], [place] [address]    Culture  Setup Time   Final    IN BOTH AEROBIC AND ANAEROBIC BOTTLES GRAM NEGATIVE RODS Gram Stain Report Called to,Read Back By and Verified With: A [facility] @0128  04/09/19 [facility] Performed at [facility], [address] [place], [place] [address]    Culture (A)  Final    SERRATIA MARCESCENS SUSCEPTIBILITIES TO FOLLOW CULTURE REINCUBATED FOR BETTER GROWTH Performed at [facility], [place] [address] [place], [place] [address]    Report Status PENDING  Incomplete  Blood Culture (routine x 2)     Status: None (Preliminary result)   Collection Time: 04/08/19  1:09 PM   Specimen: BLOOD  Result Value Ref Range Status   Specimen Description   Final    BLOOD PICC LINE DRAWN BY RN Performed at [facility], [address], [place], [place] [address]    Special Requests   Final    Blood Culture results may not be optimal due to an inadequate volume of blood received in culture bottles Performed at [facility], [address] [place], [place] [address]    Culture  Setup Time   Final    IN BOTH AEROBIC AND ANAEROBIC BOTTLES GRAM NEGATIVE RODS Gram Stain Report Called to,Read Back By and Verified With: A MUHAMMAD,RN @0130  04/09/19  [facility] CRITICAL RESULT CALLED TO, READ BACK BY AND VERIFIED WITH: PHATMD J MINDENHALL [id number] AT 724 AM [place] CM    Culture   Final    GRAM NEGATIVE RODS CULTURE REINCUBATED FOR BETTER GROWTH Performed at [facility], [place] [address], [place],  [address]    Report Status PENDING  Incomplete  Blood Culture  ID Panel (Reflexed)     Status: Abnormal   Collection Time: 04/08/19  1:09 PM  Result Value Ref Range Status   Enterococcus species NOT DETECTED NOT DETECTED Final   Listeria monocytogenes NOT DETECTED NOT DETECTED Final   Staphylococcus species NOT DETECTED NOT DETECTED Final   Staphylococcus aureus (BCID) NOT DETECTED NOT DETECTED Final   Streptococcus species NOT DETECTED NOT DETECTED Final   Streptococcus agalactiae NOT DETECTED NOT DETECTED Final   Streptococcus pneumoniae NOT DETECTED NOT DETECTED Final   Streptococcus pyogenes NOT DETECTED NOT DETECTED Final   Acinetobacter baumannii NOT DETECTED NOT DETECTED Final   Enterobacteriaceae species DETECTED (A) NOT DETECTED Final    Comment: CRITICAL RESULT CALLED TO, READ BACK BY AND VERIFIED WITH: PHARMD J MINDENHALL [id number] AT 7241 AM BY CM    Enterobacter cloacae complex NOT DETECTED NOT DETECTED Final   Escherichia coli NOT DETECTED NOT DETECTED Final   Klebsiella oxytoca NOT DETECTED NOT DETECTED Final   Klebsiella pneumoniae DETECTED (A) NOT DETECTED Final    Comment: CRITICAL RESULT CALLED TO, READ BACK BY AND VERIFIED WITH: PHARMD J MINDENHALL [id number] AT 0724 BY CM    Proteus species NOT DETECTED NOT DETECTED Final   Serratia marcescens DETECTED (A) NOT DETECTED Final    Comment: CRITICAL RESULT CALLED TO, READ BACK BY AND VERIFIED WITH: PHARMD J MINDENHALL [id number] AT 724 AM BY CM    Carbapenem resistance NOT DETECTED NOT DETECTED Final   Haemophilus influenzae NOT DETECTED NOT DETECTED Final   Neisseria meningitidis NOT DETECTED NOT DETECTED Final   Pseudomonas aeruginosa NOT DETECTED NOT DETECTED Final    Candida albicans NOT DETECTED NOT DETECTED Final   Candida glabrata NOT DETECTED NOT DETECTED Final   Candida krusei NOT DETECTED NOT DETECTED Final   Candida parapsilosis NOT DETECTED NOT DETECTED Final   Candida tropicalis NOT DETECTED NOT DETECTED Final    Comment: Performed at [facility], [address] [place], [place] [address]  Respiratory Panel by RT PCR (Flu A&B, Covid) - Nasopharyngeal Swab     Status: None   Collection Time: 04/08/19  6:14 PM   Specimen: Nasopharyngeal Swab  Result Value Ref Range Status   SARS Coronavirus 2 by RT PCR NEGATIVE NEGATIVE Final    Comment: (NOTE) SARS-CoV-2 target nucleic acids are NOT DETECTED. The SARS-CoV-2 RNA is generally detectable in upper respiratoy specimens during the acute phase of infection. The lowest concentration of SARS-CoV-2 viral copies this assay can detect is 131 copies/mL. A negative result does not preclude SARS-Cov-2 infection and should not be used as the sole basis for treatment or other patient management decisions. A negative result may occur with  improper specimen collection/handling, submission of specimen other than nasopharyngeal swab, presence of viral mutation(s) within the areas targeted by this assay, and inadequate number of viral copies (<131 copies/mL). A negative result must be combined with clinical observations, patient history, and epidemiological information. The expected result is Negative. Fact Sheet for Patients:  PinkCheek.be Fact Sheet for Healthcare Providers:  GravelBags.it This test is not yet ap proved or cleared by the [place] FDA and  has been authorized for detection and/or diagnosis of SARS-CoV-2 by FDA under an Emergency Use Authorization (EUA). This EUA will remain  in effect (meaning this test can be used) for the duration of the COVID-19 declaration under Section 564(b)(1) of the Act, 21 U.S.C. section  360bbb-3(b)(1), unless the authorization is terminated or revoked sooner.    Influenza A by PCR  NEGATIVE NEGATIVE Final   Influenza B by PCR NEGATIVE NEGATIVE Final    Comment: (NOTE) The Xpert Xpress SARS-CoV-2/FLU/RSV assay is intended as an aid in  the diagnosis of influenza from Nasopharyngeal swab specimens and  should not be used as a sole basis for treatment. Nasal washings and  aspirates are unacceptable for Xpert Xpress SARS-CoV-2/FLU/RSV  testing. Fact Sheet for Patients: PinkCheek.be Fact Sheet for Healthcare Providers: GravelBags.it This test is not yet approved or cleared by the [place] FDA and  has been authorized for detection and/or diagnosis of SARS-CoV-2 by  FDA under an Emergency Use Authorization (EUA). This EUA will remain  in effect (meaning this test can be used) for the duration of the  Covid-19 declaration under Section 564(b)(1) of the Act, 21  U.S.C. section 360bbb-3(b)(1), unless the authorization is  terminated or revoked. Performed at [facility], [place] [address] [place], [place] [address]     Radiology Reports DG Chest [place] 1 View  Result Date: 04/08/2019 CLINICAL DATA:  Hypoxia. Fever. EXAM: PORTABLE CHEST 1 VIEW COMPARISON:  04/01/2019 FINDINGS: Tracheostomy tube in good position. PICC tip is in good position just below the carina in the superior vena cava. The heart size is normal. Slight pulmonary vascular prominence without pulmonary edema. No effusions. No acute bone abnormality. IMPRESSION: Slight pulmonary vascular prominence. Electronically Signed   By: Lorriane Shire M.D.   On: 04/08/2019 13:35   DG Chest Port 1 View  Result Date: 04/01/2019 CLINICAL DATA:  Syncope EXAM: PORTABLE CHEST 1 VIEW COMPARISON:  03/17/2019 FINDINGS: Two frontal views of the chest demonstrates stable tracheostomy tube and right-sided PICC. Cardiac silhouette is unremarkable. No airspace disease,  effusion, or pneumothorax. No acute bony abnormalities. IMPRESSION: 1. No acute intrathoracic process. Electronically Signed   By: Randa Ngo M.D.   On: 04/01/2019 15:04   DG Chest Portable 1 View  Result Date: 03/17/2019 CLINICAL DATA:  Fever. EXAM: PORTABLE CHEST 1 VIEW COMPARISON:  Radiograph 03/04/2019 FINDINGS: Tracheostomy tube tip at the thoracic inlet. Right upper extremity central line tip in the SVC. Streaky opacities at the right lung base are new from prior exam. Unchanged heart size and mediastinal contours. No pulmonary edema or pneumothorax. No large pleural effusion. No acute osseous abnormalities are seen. IMPRESSION: Streaky right lung base opacities, new from radiographs earlier this month, favoring atelectasis but could represent pneumonia in the setting of fever. Electronically Signed   By: Keith Rake M.D.   On: 03/17/2019 15:54   ECHOCARDIOGRAM COMPLETE  Result Date: 04/03/2019    ECHOCARDIOGRAM REPORT   Patient Name:   RUBE SANCHEZ Date of Exam: 04/03/2019 Medical Rec #:  [id number]     Height:       72.0 in Accession #:    [id number]    Weight:       167.5 lb Date of Birth:  [birth year]     BSA:          1.976 m Patient Age:    16 years      BP:           130/92 mmHg Patient Gender: M             HR:           74 bpm. Exam Location:  Inpatient Procedure: 2D Echo, Cardiac Doppler and Color Doppler Indications:    R55 Syncope  History:        Patient has prior history of Echocardiogram examinations, most  recent 08/01/2018. CAD, Stroke; Risk Factors:Hypertension,                 Dyslipidemia and GERD. Cancer.  Sonographer:    Tiffany Dance Referring Phys: [id number] [place]  1. Normal LV systolic function; grade 1 diastolic dysfunction; mild LVH; moderately to severely dilated aortic root; suggest CTA or MRA to further assess; trace AI.  2. Left ventricular ejection fraction, by estimation, is 55 to 60%. The left ventricle has normal function. The  left ventricle has no regional wall motion abnormalities. There is mild left ventricular hypertrophy. Left ventricular diastolic parameters are consistent with Grade I diastolic dysfunction (impaired relaxation).  3. Right ventricular systolic function is normal. The right ventricular size is normal. There is mildly elevated pulmonary artery systolic pressure.  4. The mitral valve is normal in structure. Trivial mitral valve regurgitation. No evidence of mitral stenosis.  5. The aortic valve is tricuspid. Aortic valve regurgitation is trivial. No aortic stenosis is present.  6. Aortic dilatation noted. There is moderate to severe dilatation of the aortic root measuring 48 mm.  7. The inferior vena cava is dilated in size with >50% respiratory variability, suggesting right atrial pressure of 8 mmHg. FINDINGS  Left Ventricle: Left ventricular ejection fraction, by estimation, is 55 to 60%. The left ventricle has normal function. The left ventricle has no regional wall motion abnormalities. The left ventricular internal cavity size was normal in size. There is  mild left ventricular hypertrophy. Left ventricular diastolic parameters are consistent with Grade I diastolic dysfunction (impaired relaxation). Right Ventricle: The right ventricular size is normal. Right ventricular systolic function is normal. There is mildly elevated pulmonary artery systolic pressure. The tricuspid regurgitant velocity is 2.52 m/s, and with an assumed right atrial pressure of 8 mmHg, the estimated right ventricular systolic pressure is 76.7 mmHg. Left Atrium: Left atrial size was normal in size. Right Atrium: Right atrial size was normal in size. Pericardium: There is no evidence of pericardial effusion. Mitral Valve: The mitral valve is normal in structure. Normal mobility of the mitral valve leaflets. Trivial mitral valve regurgitation. No evidence of mitral valve stenosis. Tricuspid Valve: The tricuspid valve is normal in structure.  Tricuspid valve regurgitation is mild . No evidence of tricuspid stenosis. Aortic Valve: The aortic valve is tricuspid. Aortic valve regurgitation is trivial. Aortic regurgitation PHT measures 400 msec. No aortic stenosis is present. Pulmonic Valve: The pulmonic valve was normal in structure. Pulmonic valve regurgitation is trivial. No evidence of pulmonic stenosis. Aorta: The aortic root is normal in size and structure and aortic dilatation noted. There is moderate to severe dilatation of the aortic root measuring 48 mm. Venous: The inferior vena cava is dilated in size with greater than 50% respiratory variability, suggesting right atrial pressure of 8 mmHg. IAS/Shunts: No atrial level shunt detected by color flow Doppler. Additional Comments: Normal LV systolic function; grade 1 diastolic dysfunction; mild LVH; moderately to severely dilated aortic root; suggest CTA or MRA to further assess; trace AI.  LEFT VENTRICLE PLAX 2D LVIDd:         4.10 cm  Diastology LVIDs:         2.60 cm  LV e' lateral:   7.83 cm/s LV PW:         1.50 cm  LV E/e' lateral: 7.1 LV IVS:        1.30 cm  LV e' medial:    6.74 cm/s LVOT diam:     2.30 cm  LV E/e' medial:  8.2 LV SV:         72 LV SV Index:   36 LVOT Area:     4.15 cm  RIGHT VENTRICLE             IVC RV Basal diam:  2.80 cm     IVC diam: 2.40 cm RV S prime:     10.00 cm/s TAPSE (M-mode): 1.6 cm LEFT ATRIUM             Index       RIGHT ATRIUM           Index LA diam:        4.30 cm 2.18 cm/m  RA Area:     15.30 cm LA Vol (A2C):   50.6 ml 25.61 ml/m RA Volume:   36.70 ml  18.57 ml/m LA Vol (A4C):   36.2 ml 18.32 ml/m LA Biplane Vol: 44.3 ml 22.42 ml/m  AORTIC VALVE LVOT Vmax:   103.00 cm/s LVOT Vmean:  57.200 cm/s LVOT VTI:    0.173 m AI PHT:      400 msec  AORTA Ao Root diam: 4.80 cm Ao Asc diam:  3.40 cm MITRAL VALVE               TRICUSPID VALVE MV Area (PHT): 2.91 cm    TR Peak grad:   25.4 mmHg MV Decel Time: 261 msec    TR Vmax:        252.00 cm/s MV E velocity:  55.50 cm/s MV A velocity: 74.40 cm/s  SHUNTS MV E/A ratio:  0.75        Systemic VTI:  0.17 m                            Systemic Diam: 2.30 cm Kirk Ruths MD Electronically signed by Kirk Ruths MD Signature Date/Time: 04/03/2019/3:11:19 PM    Final      CBC Recent Labs  Lab 04/04/19 0454 04/05/19 0347 04/08/19 1309 04/10/19 0538  WBC 3.7* 5.7 4.2 3.6*  HGB 7.4* 9.2* 9.4* 8.2*  HCT 22.6* 27.8* 28.4* 25.9*  PLT 151 183 125* 122*  MCV 96.6 94.9 94.4 97.0  MCH 31.6 31.4 31.2 30.7  MCHC 32.7 33.1 33.1 31.7  RDW 14.9 14.9 15.7* 15.9*  LYMPHSABS  --   --  0.1*  --   MONOABS  --   --  0.2  --   EOSABS  --   --  0.0  --   BASOSABS  --   --  0.0  --     Chemistries  Recent Labs  Lab 04/04/19 0454 04/05/19 0347 04/08/19 1309 04/08/19 1827 04/10/19 0538  NA 132* 130* 126*  --  135  K 4.0 3.4* 3.4*  --  3.7  CL 99 95* 94*  --  102  CO2 27 26 23   --  27  GLUCOSE 110* 92 152*  --  114*  BUN 21* 13 16  --  18  CREATININE 0.70 0.69 0.95  --  0.63  CALCIUM 7.9* 8.3* 7.7*  --  8.1*  MG  --   --   --  1.6*  --   AST  --   --  20  --   --   ALT  --   --  16  --   --   ALKPHOS  --   --  47  --   --  BILITOT  --   --  0.7  --   --    ------------------------------------------------------------------------------------------------------------------ No results for input(s): CHOL, HDL, LDLCALC, TRIG, CHOLHDL, LDLDIRECT in the last 72 hours.  Lab Results  Component Value Date   HGBA1C 5.4 08/01/2018   ------------------------------------------------------------------------------------------------------------------ No results for input(s): TSH, T4TOTAL, T3FREE, THYROIDAB in the last 72 hours.  Invalid input(s): FREET3 ------------------------------------------------------------------------------------------------------------------ No results for input(s): VITAMINB12, FOLATE, FERRITIN, TIBC, IRON, RETICCTPCT in the last 72 hours.  Coagulation profile Recent Labs  Lab  04/08/19 1309  INR 1.1    No results for input(s): DDIMER in the last 72 hours.  Cardiac Enzymes No results for input(s): CKMB, TROPONINI, MYOGLOBIN in the last 168 hours.  Invalid input(s): CK ------------------------------------------------------------------------------------------------------------------ No results found for: BNP   Roxan Hockey M.D on 04/10/2019 at 6:43 PM  Go to [email or web address] - for contact info  [facility] - Office  [phone]

## 2019-04-10 NOTE — TOC Initial Note (Signed)
Transition of Care [facility]) - Initial/Assessment Note    Patient Details  Name: [name] MRN: [id number] Date of Birth: 1960-06-17  Transition of Care [facility]) CM/SW Contact:    Shade Flood, LCSW Phone Number: 04/10/2019, 12:14 PM  Clinical Narrative:                  Pt admitted from home. He lives with his wife. Pt was recently discharged from [facility]. He is being followed for [facility] RN by [facility]. Anticipating pt will return home with continued HH at dc. Pt has a trach and a PEG and is receiving chemo and radiation treatment.  Will follow and assist with dc planning.  Expected Discharge Plan: [place] Barriers to Discharge: Continued Medical Work up   Patient Goals and CMS Choice        Expected Discharge Plan and Services Expected Discharge Plan: [place] In-house Referral: Clinical Social Work     Living arrangements for the past 2 months: [place]                                      Prior Living Arrangements/Services Living arrangements for the past 2 months: Single Family Home Lives with:: Spouse Patient language and need for interpreter reviewed:: Yes Do you feel safe going back to the place where you live?: Yes      Need for Family Participation in Patient Care: Yes (Comment) Care giver support system in place?: Yes (comment) Current home services: Home RN Criminal Activity/Legal Involvement Pertinent to Current Situation/Hospitalization: No - Comment as needed  Activities of Daily Living Home Assistive Devices/Equipment: Cane (specify quad or straight), Walker (specify type), Vent/Trach supplies ADL Screening (condition at time of admission) Patient's cognitive ability adequate to safely complete daily activities?: Yes Is the patient deaf or have difficulty hearing?: No Does the patient have difficulty seeing, even when wearing glasses/contacts?: No Does the patient have difficulty  concentrating, remembering, or making decisions?: No Patient able to express need for assistance with ADLs?: Yes Does the patient have difficulty dressing or bathing?: No Independently performs ADLs?: Yes (appropriate for developmental age) Does the patient have difficulty walking or climbing stairs?: Yes Weakness of Legs: Both Weakness of Arms/Hands: None  Permission Sought/Granted                  Emotional Assessment       Orientation: : Oriented to Self, Oriented to Place, Oriented to  Time, Oriented to Situation Alcohol / Substance Use: Not Applicable Psych Involvement: No (comment)  Admission diagnosis:  Hyponatremia [E87.1] Febrile illness, acute [R50.9] Sepsis, due to unspecified organism, unspecified whether acute organ dysfunction present [facility]) [A41.9] Patient Active Problem List   Diagnosis Date Noted  . Febrile illness, acute 04/08/2019  . Malnutrition of moderate degree 04/03/2019  . GI bleed 04/01/2019  . Dehydration 03/28/2019  . RLL pneumonia 03/17/2019  . Hx of tracheostomy 02/18/2019  . Head and neck cancer ([place]) 02/18/2019  . 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 Stroke ([place]) 07/31/2018  . Rotator cuff syndrome of right shoulder 02/20/2013  . Bradycardia 11/06/2012  . Coronary artery disease   . Abnormal finding on cardiovascular stress test 09/25/2012  . Hypertension 09/03/2012  . Chest pain 09/03/2012  . Alcohol abuse, daily use 09/03/2012  . Hyperlipidemia 09/03/2012  . Hyponatremia 09/03/2012   PCP:  [name], MD Pharmacy:   [place], [address] [place] [address] Phone: [phone] Fax: [phone]  [place], [address] [place] [address] Phone: [phone] Fax: [phone]     Social Determinants of Health (SDOH) Interventions    Readmission Risk  Interventions Readmission Risk Prevention Plan 04/10/2019  Transportation Screening Complete  Medication Review ([place]) Complete  HRI or Home Care Consult Complete  [place] Not Applicable  Some recent data might be hidden

## 2019-04-11 ENCOUNTER — Ambulatory Visit: Payer: 59

## 2019-04-11 DIAGNOSIS — J189 Pneumonia, unspecified organism: Secondary | ICD-10-CM | POA: Diagnosis not present

## 2019-04-11 LAB — LEGIONELLA PNEUMOPHILA SEROGP 1 UR AG: L. pneumophila Serogp 1 Ur Ag: NEGATIVE

## 2019-04-11 MED ORDER — SODIUM CHLORIDE 0.9 % IV SOLN
1.0000 g | Freq: Three times a day (TID) | INTRAVENOUS | Status: DC
  Administered 2019-04-11 – 2019-04-12 (×4): 1 g via INTRAVENOUS
  Filled 2019-04-11 (×4): qty 1

## 2019-04-11 MED ORDER — SODIUM CHLORIDE 0.9% FLUSH: 10.0000 mL | INTRAVENOUS | Status: DC | PRN

## 2019-04-11 MED ORDER — SODIUM CHLORIDE 0.9% FLUSH
10.0000 mL | Freq: Two times a day (BID) | INTRAVENOUS | Status: DC
  Administered 2019-04-11 – 2019-04-13 (×4): 10 mL

## 2019-04-11 NOTE — Progress Notes (Signed)
VS obtained at 1400, WDL. Called into room by wife for patient "shaking all over." This nurse entered room with Zenaida Deed, RN to find patient alert and oriented, able to make eye contact and verbalized "I'm freezing." Blanket provided to patient. Wife reported patient having episodes similar to this prior to admission where his fever would increase. Notified by tele of HR increased to 140. VS rechecked, WDL. Patient sleeping, but arouses to voice.. Wife at bedside. Dr. Denton Brick notified, IV abx changed. To assess patient and talk with wife. Will continue to monitor.

## 2019-04-11 NOTE — Progress Notes (Signed)
Pharmacy Antibiotic Note--Meropenem dosing  Pharmacy consulted to dose meropenem for this 59 yo male lung cancer patient with bacteremia and sepsis due to  HCAP. His blood cultures grew S.marcescens (pan sensitive; resistant to cefazolin) and K. pneumoniae (awaiting sensitivities).  Patient completed four days of cefepime therapy, but now patient has new chills and meropenem is being added for possibility of ESBL infection.    Plan: Start meropenem 1g IV q8h Monitor cultures, renal fx and patient progress.  Height: 6' (182.9 cm) Weight: 75.9 kg (167 lb 5.3 oz) IBW/kg (Calculated) : 77.6  Temp (24hrs), Avg:98.3 F (36.8 C), Min:97.9 F (36.6 C), Max:98.5 F (36.9 C)  Recent Labs  Lab 04/05/19 0347 04/08/19 1309 04/08/19 1703 04/10/19 0538  WBC 5.7 4.2  --  3.6*  CREATININE 0.69 0.95  --  0.63  LATICACIDVEN  --  2.1* 1.4  --     Estimated Creatinine Clearance: 109.4 mL/min (by C-G formula based on SCr of 0.63 mg/dL).    Allergies  Allergen Reactions  . Duloxetine Other (See Comments)    Caused depression  . Prednisone Other (See Comments)    Chest pain   Antimicrobials this admission:  cefepime 4/6 >>4/9 meropenem 4/9>>   Microbiology results: 4/6 BC x2: S.marcescens (pan sens, except for cefazolin) 4/6 UCx: NGF 4/6 Resp PCR: SARS CoV-2 negative; Flu A/B negative 4/9 BC x2: pending  Despina Pole, Pharm. D. Clinical Pharmacist 04/11/2019 2:42 PM

## 2019-04-11 NOTE — Progress Notes (Signed)
Patient Demographics:    [name], is a 59 y.o. male, DOB - 15-Aug-1960, BDZ:[id number]  Admit date - 04/08/2019   Admitting Physician Jorge Dubois, MD  Outpatient Primary MD for the patient is Jorge School, MD  LOS - 3   Chief Complaint  Patient presents with  . Fever        Subjective:    [name] today has no emesis,  No chest pain,    -No further diarrhea, -Recurrent episodes of shaking chills and rigors----concerns about persistent bacteremia -Wife at bedside questions answered  Assessment  & Plan :    Principal Problem:   HCAP (healthcare-associated pneumonia) Active Problems:   Hypertension   Alcohol abuse, daily use   Hyperlipidemia   Hyponatremia   Stroke [facility] [place])   Malignant neoplasm of supraglottis ([place])   Hx of tracheostomy   Head and neck cancer (HCC)   Malnutrition of moderate degree   Febrile illness, acute  Brief Summary:- 59 y.o. male with past medical history significant for malignant neoplasm of supraglottis, status post tracheostomy, actively receiving chemotherapy and radiation; dysphagia status post PEG tube placement, history of coronary artery disease a status post PCI with stenting in 2014, history of nonhemorrhagic CVA, chronic lower back pain, essential hypertension, hyperlipidemia, recent admission secondary to acute blood loss anemia presumed to be secondary to upper GI bleed (04/01/19--04/05/19); who presented to the emergency department secondary to worsening shortness of breath, generalized weakness and productive cough.  Patient also expressed having fevers at home and not feeling well.  Denies chest pain, no nausea, no vomiting, no dysuria, no hematuria, no melena, no hematochezia, no focal deficits or any other complaints.  In the ED work-up demonstrated chest x-ray at with right lower lobe atelectasis, temperature of 101.6 and hyponatremia (slightly worse  than his baseline) and thrombocytopenia.  Initially was a slightly tachypneic with subsequent improvement by time of my examination.  2 L of oxygen supplementation in place.  Cultures taken, antibiotics initiated and TRH contacted 20 patient for further evaluation and management.  There is concern for HCAP as reason for his fevers.  A/p 1) bacteremia and sepsis secondary to HCAP-blood cultures with [name] and [name]-sensitivities of [name] not available at this time -Patient with persistent shaking chills and rigors, - patient is immunocompromised -Stop cefepime on 04/11/2019 and start meropenem to cover for the possibility of ESBL [name] -Patient is status post prior tracheostomy -Continue bronchodilators and humidifier to trach -Patient is immunocompromise due to ongoing chemo/radiation therapy -Patient has a right arm PICC line that probably will have to come out -Repeat blood cultures drawn on 04/11/2019  2) history of malignancy of the supraglottis--- currently undergoing chemo and radiation -Pancytopenia noted in the setting of chemoradiation therapy  3)/FEN/Tube Feeds--- PTA he was on Osmolite 1.5cal 36fl oz he takes 3-4x/day and is supposed to work up to 7x/day and prosource 15g protein -Dietitian consult with recommendations appreciated for tube feeds via PEG tube  4) history of prior stroke--- continue aspirin and Lipitor 5)Hyponatremia--largely asymptomatic, sodium is up to 135 after water flushes via PEG tube as adjusted -Decrease NS to 50 mL an hour  Disposition/Need for in-Hospital Stay- patient unable to be discharged at this time due to --pneumonia and immunocompromise  patients requiring IV antibiotics pending blood culture sensitivities -Patient From: From home with wife D/C Place: Back home with wife Barriers: Not Clinically Stable-immunocompromised individual with bacteremia and HCAP requiring IV antibiotics pending blood culture and  sensitivity  Code Status : Full code  Family Communication:     (patient is alert, awake and coherent) -Discussed with patient's wife at bedside  Consults  : na  DVT Prophylaxis  :   - SCDs--thrombocytopenia  Lab Results  Component Value Date   PLT 122 (L) 04/10/2019    Inpatient Medications  Scheduled Meds: . aspirin  81 mg Oral Daily  . atorvastatin  80 mg Oral QPM  . Chlorhexidine Gluconate Cloth  6 each Topical Daily  . feeding supplement (OSMOLITE 1.5 CAL)  1,000 mL Per Tube Q6H  . feeding supplement (PRO-STAT SUGAR FREE 64)  30 mL Per Tube QHS  . free water  100 mL Per Tube Q6H  . lidocaine  15 mL Mouth/Throat TID AC & HS  . pantoprazole sodium  40 mg Per Tube Daily  . sodium chloride flush  10-40 mL Intracatheter Q12H  . traZODone  100 mg Oral QHS   Continuous Infusions: . sodium chloride 50 mL/hr at 04/11/19 1600  . feeding supplement (OSMOLITE 1.5 CAL) 30 mL/hr at 04/09/19 1030  . meropenem (MERREM) IV Stopped (04/11/19 1559)   PRN Meds:.acetaminophen **OR** acetaminophen, HYDROcodone-acetaminophen, ipratropium-albuterol, nitroGLYCERIN, ondansetron **OR** ondansetron (ZOFRAN) IV, sodium chloride flush    Anti-infectives (From admission, onward)   Start     Dose/Rate Route Frequency Ordered Stop   04/11/19 1600  meropenem (MERREM) 1 g in sodium chloride 0.9 % 100 mL IVPB     1 g 200 mL/hr over 30 Minutes Intravenous Every 8 hours 04/11/19 1502     04/08/19 2200  ceFEPIme (MAXIPIME) 2 g in sodium chloride 0.9 % 100 mL IVPB  Status:  Discontinued     2 g 200 mL/hr over 30 Minutes Intravenous Every 8 hours 04/08/19 1821 04/11/19 1444   04/08/19 1300  ceFEPIme (MAXIPIME) 2 g in sodium chloride 0.9 % 100 mL IVPB     2 g 200 mL/hr over 30 Minutes Intravenous  Once 04/08/19 1258 04/08/19 1400        Objective:   Vitals:   04/11/19 0503 04/11/19 0839 04/11/19 1400 04/11/19 1500  BP: (!) 150/85  (!) 164/94 (!) 165/88  Pulse: 66  68 97  Resp: 15  16 18   Temp:  98.5 F (36.9 C)  97.9 F (36.6 C) 98.6 F (37 C)  TempSrc: Oral  Oral   SpO2: (!) 89% 95% 100% 93%  Weight:      Height:        Wt Readings from Last 3 Encounters:  04/08/19 75.9 kg  04/01/19 76 kg  03/27/19 76 kg     Intake/Output Summary (Last 24 hours) at 04/11/2019 1940 Last data filed at 04/11/2019 1600 Gross per 24 hour  Intake 4001.42 ml  Output 1100 ml  Net 2901.42 ml     Physical Exam  Gen:- Awake Alert, shaking chills from time to time  HEENT:- [place].AT, No sclera icterus Neck-tracheostomy  lungs-diminished breath sounds with scattered rhonchi CV- S1, S2 normal, regular  Abd-  +ve B.Sounds, Abd Soft, No tenderness, PEG tube site is clean dry and intact Extremity/Skin:- No  edema, pedal pulses present  Psych-affect is appropriate, oriented x3 Neuro-generalized weakness no new focal deficits, no tremors MSK-right arm PICC line   Data Review:   Micro  Results Recent Results (from the past 240 hour(s))  Urine culture     Status: None   Collection Time: 04/08/19 12:58 PM   Specimen: In/Out Cath Urine  Result Value Ref Range Status   Specimen Description   Final    IN/OUT CATH URINE Performed at [facility], [address], [place], [place] [address]    Special Requests   Final    NONE Performed at [facility], [address], [place], [place] [address]    Culture   Final    NO GROWTH Performed at [facility], [place] [address], [place], [place] [address]    Report Status 04/09/2019 FINAL  Final  Blood Culture (routine x 2)     Status: Abnormal (Preliminary result)   Collection Time: 04/08/19  1:09 PM   Specimen: BLOOD  Result Value Ref Range Status   Specimen Description   Final    BLOOD LEFT ANTECUBITAL Performed at [facility], [address] [place], [place] [address]    Special Requests   Final    Blood Culture adequate volume Performed at [facility], [address], [place], [place] [address]    Culture  Setup Time   Final    IN BOTH  AEROBIC AND ANAEROBIC BOTTLES GRAM NEGATIVE RODS Gram Stain Report Called to,Read Back By and Verified With: A [employer] @0128  04/09/19 MKELLY Performed at [facility], [address], [place], [place] [address]    Culture [name] (A)  Final   Report Status PENDING  Incomplete   Organism ID, Bacteria [name]  Final      Susceptibility   [name] - MIC*    CEFAZOLIN >=64 RESISTANT Resistant     CEFEPIME <=0.12 SENSITIVE Sensitive     CEFTAZIDIME <=1 SENSITIVE Sensitive     CEFTRIAXONE <=0.25 SENSITIVE Sensitive     CIPROFLOXACIN <=0.25 SENSITIVE Sensitive     GENTAMICIN <=1 SENSITIVE Sensitive     TRIMETH/SULFA <=20 SENSITIVE Sensitive     * [name]  Blood Culture (routine x 2)     Status: Abnormal (Preliminary result)   Collection Time: 04/08/19  1:09 PM   Specimen: BLOOD  Result Value Ref Range Status   Specimen Description   Final    BLOOD PICC LINE DRAWN BY RN Performed at [facility], [address], [place], [place] [address]    Special Requests   Final    Blood Culture results may not be optimal due to an inadequate volume of blood received in culture bottles Performed at [facility], [address] [place], [place] [address]    Culture  Setup Time   Final    IN BOTH AEROBIC AND ANAEROBIC BOTTLES GRAM NEGATIVE RODS Gram Stain Report Called to,Read Back By and Verified With: A MUHAMMAD,RN @0130  04/09/19 MKELLY CRITICAL RESULT CALLED TO, READ BACK BY AND VERIFIED WITH: PHATMD J MINDENHALL [id number] AT 724 AM [facility] CM    Culture (A)  Final    [name] PNEUMONIAE [name] SUSCEPTIBILITIES TO FOLLOW Performed at [facility], [place] [address], [place], [place] [address]    Report Status PENDING  Incomplete  Blood Culture ID Panel (Reflexed)     Status: Abnormal   Collection Time: 04/08/19  1:09 PM  Result Value Ref Range Status   Enterococcus species NOT DETECTED NOT DETECTED Final   Listeria monocytogenes  NOT DETECTED NOT DETECTED Final   Staphylococcus species NOT DETECTED NOT DETECTED Final   Staphylococcus aureus (BCID) NOT  DETECTED NOT DETECTED Final   Streptococcus species NOT DETECTED NOT DETECTED Final   Streptococcus agalactiae NOT DETECTED NOT DETECTED Final   Streptococcus pneumoniae NOT DETECTED NOT DETECTED Final   Streptococcus pyogenes NOT DETECTED NOT DETECTED Final   Acinetobacter baumannii NOT DETECTED NOT DETECTED Final   Enterobacteriaceae species DETECTED (A) NOT DETECTED Final    Comment: CRITICAL RESULT CALLED TO, READ BACK BY AND VERIFIED WITH: PHARMD J MINDENHALL [id number] AT 7241 AM BY CM    Enterobacter cloacae complex NOT DETECTED NOT DETECTED Final   Escherichia coli NOT DETECTED NOT DETECTED Final   [name] oxytoca NOT DETECTED NOT DETECTED Final   [name] pneumoniae DETECTED (A) NOT DETECTED Final    Comment: CRITICAL RESULT CALLED TO, READ BACK BY AND VERIFIED WITH: PHARMD J MINDENHALL [id number] AT 0724 BY CM    Proteus species NOT DETECTED NOT DETECTED Final   [name] DETECTED (A) NOT DETECTED Final    Comment: CRITICAL RESULT CALLED TO, READ BACK BY AND VERIFIED WITH: PHARMD J MINDENHALL [id number] AT 724 AM BY CM    Carbapenem resistance NOT DETECTED NOT DETECTED Final   Haemophilus influenzae NOT DETECTED NOT DETECTED Final   Neisseria meningitidis NOT DETECTED NOT DETECTED Final   Pseudomonas aeruginosa NOT DETECTED NOT DETECTED Final   Candida albicans NOT DETECTED NOT DETECTED Final   Candida glabrata NOT DETECTED NOT DETECTED Final   Candida krusei NOT DETECTED NOT DETECTED Final   Candida parapsilosis NOT DETECTED NOT DETECTED Final   Candida tropicalis NOT DETECTED NOT DETECTED Final    Comment: Performed at [facility], [place], [place], [place] [address]  Respiratory Panel by RT PCR (Flu A&B, Covid) - Nasopharyngeal Swab     Status: None   Collection Time: 04/08/19  6:14 PM   Specimen: Nasopharyngeal Swab  Result  Value Ref Range Status   SARS Coronavirus 2 by RT PCR NEGATIVE NEGATIVE Final    Comment: (NOTE) SARS-CoV-2 target nucleic acids are NOT DETECTED. The SARS-CoV-2 RNA is generally detectable in upper respiratoy specimens during the acute phase of infection. The lowest concentration of SARS-CoV-2 viral copies this assay can detect is 131 copies/mL. A negative result does not preclude SARS-Cov-2 infection and should not be used as the sole basis for treatment or other patient management decisions. A negative result may occur with  improper specimen collection/handling, submission of specimen other than nasopharyngeal swab, presence of viral mutation(s) within the areas targeted by this assay, and inadequate number of viral copies (<131 copies/mL). A negative result must be combined with clinical observations, patient history, and epidemiological information. The expected result is Negative. Fact Sheet for Patients:  PinkCheek.be Fact Sheet for Healthcare Providers:  GravelBags.it This test is not yet ap proved or cleared by the [place] FDA and  has been authorized for detection and/or diagnosis of SARS-CoV-2 by FDA under an Emergency Use Authorization (EUA). This EUA will remain  in effect (meaning this test can be used) for the duration of the COVID-19 declaration under Section 564(b)(1) of the Act, 21 U.S.C. section 360bbb-3(b)(1), unless the authorization is terminated or revoked sooner.    Influenza A by PCR NEGATIVE NEGATIVE Final   Influenza B by PCR NEGATIVE NEGATIVE Final    Comment: (NOTE) The Xpert Xpress SARS-CoV-2/FLU/RSV assay is intended as an aid in  the diagnosis of influenza from Nasopharyngeal swab specimens and  should not be used as a sole basis for treatment. Nasal washings and  aspirates are unacceptable for  Xpert Xpress SARS-CoV-2/FLU/RSV  testing. Fact Sheet for  Patients: PinkCheek.be Fact Sheet for Healthcare Providers: GravelBags.it This test is not yet approved or cleared by the [place] FDA and  has been authorized for detection and/or diagnosis of SARS-CoV-2 by  FDA under an Emergency Use Authorization (EUA). This EUA will remain  in effect (meaning this test can be used) for the duration of the  Covid-19 declaration under Section 564(b)(1) of the Act, 21  U.S.C. section 360bbb-3(b)(1), unless the authorization is  terminated or revoked. Performed at [facility], [place] [address] [place], [place] [address]   Culture, blood (Routine X 2) w Reflex to ID Panel     Status: None (Preliminary result)   Collection Time: 04/11/19  8:58 AM   Specimen: Left Antecubital; Blood  Result Value Ref Range Status   Specimen Description   Final    LEFT ANTECUBITAL BOTTLES DRAWN AEROBIC AND ANAEROBIC   Special Requests   Final    Blood Culture adequate volume Performed at [facility], [address], [place], [place] [address]    Culture PENDING  Incomplete   Report Status PENDING  Incomplete  Culture, blood (Routine X 2) w Reflex to ID Panel     Status: None (Preliminary result)   Collection Time: 04/11/19  8:58 AM   Specimen: BLOOD LEFT FOREARM  Result Value Ref Range Status   Specimen Description   Final    BLOOD LEFT FOREARM BOTTLES DRAWN AEROBIC AND ANAEROBIC   Special Requests   Final    Blood Culture adequate volume Performed at [facility], [address], [place], [place] [address]    Culture PENDING  Incomplete   Report Status PENDING  Incomplete    Radiology Reports DG Chest Port 1 View  Result Date: 04/08/2019 CLINICAL DATA:  Hypoxia. Fever. EXAM: PORTABLE CHEST 1 VIEW COMPARISON:  04/01/2019 FINDINGS: Tracheostomy tube in good position. PICC tip is in good position just below the carina in the superior vena cava. The heart size is normal. Slight pulmonary vascular  prominence without pulmonary edema. No effusions. No acute bone abnormality. IMPRESSION: Slight pulmonary vascular prominence. Electronically Signed   By: Lorriane Shire M.D.   On: 04/08/2019 13:35   DG Chest Port 1 View  Result Date: 04/01/2019 CLINICAL DATA:  Syncope EXAM: PORTABLE CHEST 1 VIEW COMPARISON:  03/17/2019 FINDINGS: Two frontal views of the chest demonstrates stable tracheostomy tube and right-sided PICC. Cardiac silhouette is unremarkable. No airspace disease, effusion, or pneumothorax. No acute bony abnormalities. IMPRESSION: 1. No acute intrathoracic process. Electronically Signed   By: Randa Ngo M.D.   On: 04/01/2019 15:04   DG Chest Portable 1 View  Result Date: 03/17/2019 CLINICAL DATA:  Fever. EXAM: PORTABLE CHEST 1 VIEW COMPARISON:  Radiograph 03/04/2019 FINDINGS: Tracheostomy tube tip at the thoracic inlet. Right upper extremity central line tip in the SVC. Streaky opacities at the right lung base are new from prior exam. Unchanged heart size and mediastinal contours. No pulmonary edema or pneumothorax. No large pleural effusion. No acute osseous abnormalities are seen. IMPRESSION: Streaky right lung base opacities, new from radiographs earlier this month, favoring atelectasis but could represent pneumonia in the setting of fever. Electronically Signed   By: Keith Rake M.D.   On: 03/17/2019 15:54   ECHOCARDIOGRAM COMPLETE  Result Date: 04/03/2019    ECHOCARDIOGRAM REPORT   Patient Name:   [name] Date of Exam: 04/03/2019 Medical Rec #:  [id number]     Height:  72.0 in Accession #:    [id number]    Weight:       167.5 lb Date of Birth:  1960/10/21     BSA:          1.976 m Patient Age:    76 years      BP:           130/92 mmHg Patient Gender: M             HR:           74 bpm. Exam Location:  Inpatient Procedure: 2D Echo, Cardiac Doppler and Color Doppler Indications:    R55 Syncope  History:        Patient has prior history of Echocardiogram examinations, most                  recent 08/01/2018. CAD, Stroke; Risk Factors:Hypertension,                 Dyslipidemia and GERD. Cancer.  Sonographer:    Tiffany Dance Referring Phys: [id number] [place]  1. Normal LV systolic function; grade 1 diastolic dysfunction; mild LVH; moderately to severely dilated aortic root; suggest CTA or MRA to further assess; trace AI.  2. Left ventricular ejection fraction, by estimation, is 55 to 60%. The left ventricle has normal function. The left ventricle has no regional wall motion abnormalities. There is mild left ventricular hypertrophy. Left ventricular diastolic parameters are consistent with Grade I diastolic dysfunction (impaired relaxation).  3. Right ventricular systolic function is normal. The right ventricular size is normal. There is mildly elevated pulmonary artery systolic pressure.  4. The mitral valve is normal in structure. Trivial mitral valve regurgitation. No evidence of mitral stenosis.  5. The aortic valve is tricuspid. Aortic valve regurgitation is trivial. No aortic stenosis is present.  6. Aortic dilatation noted. There is moderate to severe dilatation of the aortic root measuring 48 mm.  7. The inferior vena cava is dilated in size with >50% respiratory variability, suggesting right atrial pressure of 8 mmHg. FINDINGS  Left Ventricle: Left ventricular ejection fraction, by estimation, is 55 to 60%. The left ventricle has normal function. The left ventricle has no regional wall motion abnormalities. The left ventricular internal cavity size was normal in size. There is  mild left ventricular hypertrophy. Left ventricular diastolic parameters are consistent with Grade I diastolic dysfunction (impaired relaxation). Right Ventricle: The right ventricular size is normal. Right ventricular systolic function is normal. There is mildly elevated pulmonary artery systolic pressure. The tricuspid regurgitant velocity is 2.52 m/s, and with an assumed right atrial  pressure of 8 mmHg, the estimated right ventricular systolic pressure is 16.0 mmHg. Left Atrium: Left atrial size was normal in size. Right Atrium: Right atrial size was normal in size. Pericardium: There is no evidence of pericardial effusion. Mitral Valve: The mitral valve is normal in structure. Normal mobility of the mitral valve leaflets. Trivial mitral valve regurgitation. No evidence of mitral valve stenosis. Tricuspid Valve: The tricuspid valve is normal in structure. Tricuspid valve regurgitation is mild . No evidence of tricuspid stenosis. Aortic Valve: The aortic valve is tricuspid. Aortic valve regurgitation is trivial. Aortic regurgitation PHT measures 400 msec. No aortic stenosis is present. Pulmonic Valve: The pulmonic valve was normal in structure. Pulmonic valve regurgitation is trivial. No evidence of pulmonic stenosis. Aorta: The aortic root is normal in size and structure and aortic dilatation noted. There is moderate to severe dilatation of the aortic  root measuring 48 mm. Venous: The inferior vena cava is dilated in size with greater than 50% respiratory variability, suggesting right atrial pressure of 8 mmHg. IAS/Shunts: No atrial level shunt detected by color flow Doppler. Additional Comments: Normal LV systolic function; grade 1 diastolic dysfunction; mild LVH; moderately to severely dilated aortic root; suggest CTA or MRA to further assess; trace AI.  LEFT VENTRICLE PLAX 2D LVIDd:         4.10 cm  Diastology LVIDs:         2.60 cm  LV e' lateral:   7.83 cm/s LV PW:         1.50 cm  LV E/e' lateral: 7.1 LV IVS:        1.30 cm  LV e' medial:    6.74 cm/s LVOT diam:     2.30 cm  LV E/e' medial:  8.2 LV SV:         72 LV SV Index:   36 LVOT Area:     4.15 cm  RIGHT VENTRICLE             IVC RV Basal diam:  2.80 cm     IVC diam: 2.40 cm RV S prime:     10.00 cm/s TAPSE (M-mode): 1.6 cm LEFT ATRIUM             Index       RIGHT ATRIUM           Index LA diam:        4.30 cm 2.18 cm/m  RA Area:      15.30 cm LA Vol (A2C):   50.6 ml 25.61 ml/m RA Volume:   36.70 ml  18.57 ml/m LA Vol (A4C):   36.2 ml 18.32 ml/m LA Biplane Vol: 44.3 ml 22.42 ml/m  AORTIC VALVE LVOT Vmax:   103.00 cm/s LVOT Vmean:  57.200 cm/s LVOT VTI:    0.173 m AI PHT:      400 msec  AORTA Ao Root diam: 4.80 cm Ao Asc diam:  3.40 cm MITRAL VALVE               TRICUSPID VALVE MV Area (PHT): 2.91 cm    TR Peak grad:   25.4 mmHg MV Decel Time: 261 msec    TR Vmax:        252.00 cm/s MV E velocity: 55.50 cm/s MV A velocity: 74.40 cm/s  SHUNTS MV E/A ratio:  0.75        Systemic VTI:  0.17 m                            Systemic Diam: 2.30 cm Kirk Ruths MD Electronically signed by Kirk Ruths MD Signature Date/Time: 04/03/2019/3:11:19 PM    Final      CBC Recent Labs  Lab 04/05/19 0347 04/08/19 1309 04/10/19 0538  WBC 5.7 4.2 3.6*  HGB 9.2* 9.4* 8.2*  HCT 27.8* 28.4* 25.9*  PLT 183 125* 122*  MCV 94.9 94.4 97.0  MCH 31.4 31.2 30.7  MCHC 33.1 33.1 31.7  RDW 14.9 15.7* 15.9*  LYMPHSABS  --  0.1*  --   MONOABS  --  0.2  --   EOSABS  --  0.0  --   BASOSABS  --  0.0  --     Chemistries  Recent Labs  Lab 04/05/19 0347 04/08/19 1309 04/08/19 1827 04/10/19 0538  NA 130* 126*  --  135  K 3.4* 3.4*  --  3.7  CL 95* 94*  --  102  CO2 26 23  --  27  GLUCOSE 92 152*  --  114*  BUN 13 16  --  18  CREATININE 0.69 0.95  --  0.63  CALCIUM 8.3* 7.7*  --  8.1*  MG  --   --  1.6*  --   AST  --  20  --   --   ALT  --  16  --   --   ALKPHOS  --  47  --   --   BILITOT  --  0.7  --   --    ------------------------------------------------------------------------------------------------------------------ No results for input(s): CHOL, HDL, LDLCALC, TRIG, CHOLHDL, LDLDIRECT in the last 72 hours.  Lab Results  Component Value Date   HGBA1C 5.4 08/01/2018   ------------------------------------------------------------------------------------------------------------------ No results for input(s): TSH, T4TOTAL,  T3FREE, THYROIDAB in the last 72 hours.  Invalid input(s): FREET3 ------------------------------------------------------------------------------------------------------------------ No results for input(s): VITAMINB12, FOLATE, FERRITIN, TIBC, IRON, RETICCTPCT in the last 72 hours.  Coagulation profile Recent Labs  Lab 04/08/19 1309  INR 1.1    No results for input(s): DDIMER in the last 72 hours.  Cardiac Enzymes No results for input(s): CKMB, TROPONINI, MYOGLOBIN in the last 168 hours.  Invalid input(s): CK ------------------------------------------------------------------------------------------------------------------ No results found for: BNP   Roxan Hockey M.D on 04/11/2019 at 7:39 PM  Go to [email or web address] - for contact info  [facility] - Office  [phone]

## 2019-04-12 DIAGNOSIS — J189 Pneumonia, unspecified organism: Secondary | ICD-10-CM | POA: Diagnosis not present

## 2019-04-12 LAB — CBC
HCT: 27.7 % — ABNORMAL LOW (ref 39.0–52.0)
Hemoglobin: 8.9 g/dL — ABNORMAL LOW (ref 13.0–17.0)
MCH: 30.4 pg (ref 26.0–34.0)
MCHC: 32.1 g/dL (ref 30.0–36.0)
MCV: 94.5 fL (ref 80.0–100.0)
Platelets: 156 10*3/uL (ref 150–400)
RBC: 2.93 MIL/uL — ABNORMAL LOW (ref 4.22–5.81)
RDW: 15.4 % (ref 11.5–15.5)
WBC: 4.6 10*3/uL (ref 4.0–10.5)
nRBC: 0 % (ref 0.0–0.2)

## 2019-04-12 LAB — BASIC METABOLIC PANEL
Anion gap: 10 (ref 5–15)
BUN: 10 mg/dL (ref 6–20)
CO2: 27 mmol/L (ref 22–32)
Calcium: 8.4 mg/dL — ABNORMAL LOW (ref 8.9–10.3)
Chloride: 100 mmol/L (ref 98–111)
Creatinine, Ser: 0.69 mg/dL (ref 0.61–1.24)
GFR calc Af Amer: >60 mL/min (ref >60)
GFR calc non Af Amer: >60 mL/min (ref >60)
Glucose, Bld: 116 mg/dL — ABNORMAL HIGH (ref 70–99)
Potassium: 3.5 mmol/L (ref 3.5–5.1)
Sodium: 137 mmol/L (ref 135–145)

## 2019-04-12 LAB — CULTURE, BLOOD (ROUTINE X 2)

## 2019-04-12 MED ORDER — SODIUM CHLORIDE 0.9 % IV SOLN
2.0000 g | INTRAVENOUS | Status: DC
  Administered 2019-04-12 – 2019-04-14 (×3): 2 g via INTRAVENOUS
  Filled 2019-04-12 (×3): qty 20

## 2019-04-12 NOTE — Progress Notes (Signed)
PICC line removed per MD order without complications, catheter intact, length noted at 37 cms upon removal. Peripheral IV initiated prior to removal of PICC line. Sterile Vaseline gauze applied to removal site immediately after PICC removal, direct pressure held for 5 minutes. Secured dressing with tape. Pt advised to leave dressing intact for 24 hours and not to get the dressing wet. Primary RN notified to pass information to leave dressing intact for 24 hours.

## 2019-04-12 NOTE — Progress Notes (Signed)
Patient Demographics:    [name], is a 59 y.o. male, DOB - 1960-03-13, OYD:[id number]  Admit date - 04/08/2019   Admitting Physician Barton Dubois, MD  Outpatient Primary MD for the patient is [name], MD  LOS - 4   Chief Complaint  Patient presents with  . Fever        Subjective:    [name] today has no emesis,  No chest pain,    No fever  Or chills   No Nausea, Vomiting or Diarrhea --No productive cough -Tracheostomy secretions mostly clear   Assessment  & Plan :    Principal Problem:   HCAP (healthcare-associated pneumonia) Active Problems:   Hypertension   Alcohol abuse, daily use   Hyperlipidemia   Hyponatremia   Stroke [facility])   Malignant neoplasm of supraglottis (HCC)   Hx of tracheostomy   Head and neck cancer (HCC)   Malnutrition of moderate degree   Febrile illness, acute  Brief Summary:- 59 y.o. male with past medical history significant for malignant neoplasm of supraglottis, status post tracheostomy, actively receiving chemotherapy and radiation; dysphagia status post PEG tube placement, history of coronary artery disease a status post PCI with stenting in 2014, history of nonhemorrhagic CVA, chronic lower back pain, essential hypertension, hyperlipidemia, recent admission secondary to acute blood loss anemia presumed to be secondary to upper GI bleed (04/01/19--04/05/19); who presented to the emergency department secondary to worsening shortness of breath, generalized weakness and productive cough.  Patient also expressed having fevers at home and not feeling well.  Denies chest pain, no nausea, no vomiting, no dysuria, no hematuria, no melena, no hematochezia, no focal deficits or any other complaints.  In the ED work-up demonstrated chest x-ray at with right lower lobe atelectasis, temperature of 101.6 and hyponatremia (slightly worse than his baseline) and  thrombocytopenia.  Initially was a slightly tachypneic with subsequent improvement by time of my examination.  2 L of oxygen supplementation in place.  Cultures taken, antibiotics initiated and TRH contacted 20 patient for further evaluation and management.  There is concern for HCAP as reason for his fevers.  A/p 1) bacteremia and sepsis secondary to HCAP-blood cultures from 04/08/19 with Klebsiella and Serratia marcescens-both organisms mostly pansensitive (No ESBL)  -Discussed with Dr. Lita Mains  from infectious disease -Antimicrobials this admission:  cefepime 4/6 >>4/9 meropenem 4/9>>04/12/19 Rocephin 2gm iv 04/12/19 -Right arm PICC line pulled on 04/12/19 -Plan is to treat with IV Rocephin for total 7 to 10 days followed by p.o. Levaquin to complete 14 days of antibiotic regimen -Blood cultures from 04/11/2019 NGTD -Patient is status post prior tracheostomy -Continue bronchodilators and humidifier to trach -Patient is immunocompromise due to ongoing chemo/radiation therapy  2) history of malignancy of the supraglottis--- currently undergoing chemo and radiation -Leukopenia improving noted in the setting of chemoradiation therapy  3)/FEN/Tube Feeds--- PTA he was on Osmolite 1.5cal 67fl oz he takes 3-4x/day and is supposed to work up to 7x/day and prosource 15g protein -Dietitian consult with recommendations appreciated for tube feeds via PEG tube  4) history of prior stroke--- continue aspirin and Lipitor 5)Hyponatremia--largely asymptomatic, sodium is up to 137 after water flushes via PEG tube as adjusted -Decrease NS to [facility]  Disposition/Need for in-Hospital Stay-  patient unable to be discharged at this time due to --pneumonia and immunocompromise patients requiring IV antibiotics  -Patient From: From home with wife D/C Place: Back home with wife Barriers: Not Clinically Stable-immunocompromised individual with bacteremia and HCAP requiring IV antibiotics   Code Status : Full  code  Family Communication:     (patient is alert, awake and coherent) -Discussed with patient's wife at bedside  Consults  : na  DVT Prophylaxis  :   - SCDs--thrombocytopenia  Lab Results  Component Value Date   PLT 156 04/12/2019    Inpatient Medications  Scheduled Meds: . aspirin  81 mg Oral Daily  . atorvastatin  80 mg Oral QPM  . Chlorhexidine Gluconate Cloth  6 each Topical Daily  . feeding supplement (OSMOLITE 1.5 CAL)  1,000 mL Per Tube Q6H  . feeding supplement (PRO-STAT SUGAR FREE 64)  30 mL Per Tube QHS  . free water  100 mL Per Tube Q6H  . lidocaine  15 mL Mouth/Throat TID AC & HS  . pantoprazole sodium  40 mg Per Tube Daily  . sodium chloride flush  10-40 mL Intracatheter Q12H  . traZODone  100 mg Oral QHS   Continuous Infusions: . sodium chloride 50 mL/hr at 04/11/19 1600  . cefTRIAXone (ROCEPHIN)  IV 2 g (04/12/19 1652)  . feeding supplement (OSMOLITE 1.5 CAL) 1,000 mL (04/12/19 1128)   PRN Meds:.acetaminophen **OR** acetaminophen, HYDROcodone-acetaminophen, ipratropium-albuterol, nitroGLYCERIN, ondansetron **OR** ondansetron (ZOFRAN) IV, sodium chloride flush    Anti-infectives (From admission, onward)   Start     Dose/Rate Route Frequency Ordered Stop   04/12/19 1600  cefTRIAXone (ROCEPHIN) 2 g in sodium chloride 0.9 % 100 mL IVPB     2 g 200 mL/hr over 30 Minutes Intravenous Every 24 hours 04/12/19 1531     04/11/19 1600  meropenem (MERREM) 1 g in sodium chloride 0.9 % 100 mL IVPB  Status:  Discontinued     1 g 200 mL/hr over 30 Minutes Intravenous Every 8 hours 04/11/19 1502 04/12/19 1531   04/08/19 2200  ceFEPIme (MAXIPIME) 2 g in sodium chloride 0.9 % 100 mL IVPB  Status:  Discontinued     2 g 200 mL/hr over 30 Minutes Intravenous Every 8 hours 04/08/19 1821 04/11/19 1444   04/08/19 1300  ceFEPIme (MAXIPIME) 2 g in sodium chloride 0.9 % 100 mL IVPB     2 g 200 mL/hr over 30 Minutes Intravenous  Once 04/08/19 1258 04/08/19 1400         Objective:   Vitals:   04/12/19 0519 04/12/19 0755 04/12/19 1359 04/12/19 1733  BP: (!) 188/87  (!) 170/82   Pulse: 89  67   Resp: 16  16   Temp: 99 F (37.2 C)  98.1 F (36.7 C)   TempSrc: Oral  Oral   SpO2: 91% 91% 99% 97%  Weight:      Height:        Wt Readings from Last 3 Encounters:  04/08/19 75.9 kg  04/01/19 76 kg  03/27/19 76 kg     Intake/Output Summary (Last 24 hours) at 04/12/2019 1919 Last data filed at 04/12/2019 1700 Gross per 24 hour  Intake 5175.09 ml  Output 2300 ml  Net 2875.09 ml     Physical Exam  Gen:- Awake Alert, shaking chills from time to time  HEENT:- [place].AT, No sclera icterus Neck-tracheostomy  lungs-diminished breath sounds with scattered rhonchi CV- S1, S2 normal, regular  Abd-  +ve B.Sounds, Abd Soft, No  tenderness, PEG tube site is clean dry and intact Extremity/Skin:- No  edema, pedal pulses present  Psych-affect is appropriate, oriented x3 Neuro-generalized weakness no new focal deficits, no tremors MSK-right arm PICC line removed on 04/12/2019   Data Review:   Micro Results Recent Results (from the past 240 hour(s))  Urine culture     Status: None   Collection Time: 04/08/19 12:58 PM   Specimen: In/Out Cath Urine  Result Value Ref Range Status   Specimen Description   Final    IN/OUT CATH URINE Performed at [facility], [address], [place], [place] [address]    Special Requests   Final    NONE Performed at [facility], [address], [place], [place] [address]    Culture   Final    NO GROWTH Performed at [facility], [place] [address], [place], [place] [address]    Report Status 04/09/2019 FINAL  Final  Blood Culture (routine x 2)     Status: Abnormal (Preliminary result)   Collection Time: 04/08/19  1:09 PM   Specimen: BLOOD  Result Value Ref Range Status   Specimen Description   Final    BLOOD LEFT ANTECUBITAL Performed at [facility], [address], [place], [place] [address]    Special Requests    Final    Blood Culture adequate volume Performed at [facility], [address], [place], [place] [address]    Culture  Setup Time   Final    IN BOTH AEROBIC AND ANAEROBIC BOTTLES GRAM NEGATIVE RODS Gram Stain Report Called to,Read Back By and Verified With: A [facility] @0128  04/09/19 MKELLY Performed at [facility], [address], [place], [place] [address]    Culture SERRATIA MARCESCENS (A)  Final   Report Status PENDING  Incomplete   Organism ID, Bacteria SERRATIA MARCESCENS  Final      Susceptibility   Serratia marcescens - MIC*    CEFAZOLIN >=64 RESISTANT Resistant     CEFEPIME <=0.12 SENSITIVE Sensitive     CEFTAZIDIME <=1 SENSITIVE Sensitive     CEFTRIAXONE <=0.25 SENSITIVE Sensitive     CIPROFLOXACIN <=0.25 SENSITIVE Sensitive     GENTAMICIN <=1 SENSITIVE Sensitive     TRIMETH/SULFA <=20 SENSITIVE Sensitive     * SERRATIA MARCESCENS  Blood Culture (routine x 2)     Status: Abnormal   Collection Time: 04/08/19  1:09 PM   Specimen: BLOOD  Result Value Ref Range Status   Specimen Description   Final    BLOOD PICC LINE DRAWN BY RN Performed at [facility], [address], [place], [place] [address]    Special Requests   Final    Blood Culture results may not be optimal due to an inadequate volume of blood received in culture bottles Performed at [facility], [address] [place], [place] [address]    Culture  Setup Time   Final    IN BOTH AEROBIC AND ANAEROBIC BOTTLES GRAM NEGATIVE RODS Gram Stain Report Called to,Read Back By and Verified With: A MUHAMMAD,RN @0130  04/09/19 MKELLY CRITICAL RESULT CALLED TO, READ BACK BY AND VERIFIED WITH: PHATMD J MINDENHALL [id number] AT 724 AM [facility] CM    Culture (A)  Final    KLEBSIELLA PNEUMONIAE SERRATIA MARCESCENS SUSCEPTIBILITIES PERFORMED ON PREVIOUS CULTURE WITHIN THE LAST 5 DAYS. Performed at [facility], [place] [address] [place], [place] [address]    Report Status 04/12/2019 FINAL  Final   Organism ID, Bacteria  KLEBSIELLA PNEUMONIAE  Final  Susceptibility   Klebsiella pneumoniae - MIC*    AMPICILLIN RESISTANT Resistant     CEFAZOLIN <=4 SENSITIVE Sensitive     CEFEPIME <=0.12 SENSITIVE Sensitive     CEFTAZIDIME <=1 SENSITIVE Sensitive     CEFTRIAXONE <=0.25 SENSITIVE Sensitive     CIPROFLOXACIN <=0.25 SENSITIVE Sensitive     GENTAMICIN <=1 SENSITIVE Sensitive     IMIPENEM <=0.25 SENSITIVE Sensitive     TRIMETH/SULFA <=20 SENSITIVE Sensitive     AMPICILLIN/SULBACTAM 4 SENSITIVE Sensitive     PIP/TAZO <=4 SENSITIVE Sensitive     * KLEBSIELLA PNEUMONIAE  Blood Culture ID Panel (Reflexed)     Status: Abnormal   Collection Time: 04/08/19  1:09 PM  Result Value Ref Range Status   Enterococcus species NOT DETECTED NOT DETECTED Final   Listeria monocytogenes NOT DETECTED NOT DETECTED Final   Staphylococcus species NOT DETECTED NOT DETECTED Final   Staphylococcus aureus (BCID) NOT DETECTED NOT DETECTED Final   Streptococcus species NOT DETECTED NOT DETECTED Final   Streptococcus agalactiae NOT DETECTED NOT DETECTED Final   Streptococcus pneumoniae NOT DETECTED NOT DETECTED Final   Streptococcus pyogenes NOT DETECTED NOT DETECTED Final   Acinetobacter baumannii NOT DETECTED NOT DETECTED Final   Enterobacteriaceae species DETECTED (A) NOT DETECTED Final    Comment: CRITICAL RESULT CALLED TO, READ BACK BY AND VERIFIED WITH: PHARMD J MINDENHALL [id number] AT 7241 AM BY CM    Enterobacter cloacae complex NOT DETECTED NOT DETECTED Final   Escherichia coli NOT DETECTED NOT DETECTED Final   Klebsiella oxytoca NOT DETECTED NOT DETECTED Final   Klebsiella pneumoniae DETECTED (A) NOT DETECTED Final    Comment: CRITICAL RESULT CALLED TO, READ BACK BY AND VERIFIED WITH: PHARMD J MINDENHALL [id number] AT 0724 BY CM    Proteus species NOT DETECTED NOT DETECTED Final   Serratia marcescens DETECTED (A) NOT DETECTED Final    Comment: CRITICAL RESULT CALLED TO, READ BACK BY AND VERIFIED WITH: PHARMD J MINDENHALL  [id number] AT 724 AM BY CM    Carbapenem resistance NOT DETECTED NOT DETECTED Final   Haemophilus influenzae NOT DETECTED NOT DETECTED Final   Neisseria meningitidis NOT DETECTED NOT DETECTED Final   Pseudomonas aeruginosa NOT DETECTED NOT DETECTED Final   Candida albicans NOT DETECTED NOT DETECTED Final   Candida glabrata NOT DETECTED NOT DETECTED Final   Candida krusei NOT DETECTED NOT DETECTED Final   Candida parapsilosis NOT DETECTED NOT DETECTED Final   Candida tropicalis NOT DETECTED NOT DETECTED Final    Comment: Performed at [facility], [place]. [address], [place], [place] [address]  Respiratory Panel by RT PCR (Flu A&B, Covid) - Nasopharyngeal Swab     Status: None   Collection Time: 04/08/19  6:14 PM   Specimen: Nasopharyngeal Swab  Result Value Ref Range Status   SARS Coronavirus 2 by RT PCR NEGATIVE NEGATIVE Final    Comment: (NOTE) SARS-CoV-2 target nucleic acids are NOT DETECTED. The SARS-CoV-2 RNA is generally detectable in upper respiratoy specimens during the acute phase of infection. The lowest concentration of SARS-CoV-2 viral copies this assay can detect is 131 copies/mL. A negative result does not preclude SARS-Cov-2 infection and should not be used as the sole basis for treatment or other patient management decisions. A negative result may occur with  improper specimen collection/handling, submission of specimen other than nasopharyngeal swab, presence of viral mutation(s) within the areas targeted by this assay, and inadequate number of viral copies (<131 copies/mL). A negative result must be combined with clinical  observations, patient history, and epidemiological information. The expected result is Negative. Fact Sheet for Patients:  PinkCheek.be Fact Sheet for Healthcare Providers:  GravelBags.it This test is not yet ap proved or cleared by the [place] FDA and  has been authorized for  detection and/or diagnosis of SARS-CoV-2 by FDA under an Emergency Use Authorization (EUA). This EUA will remain  in effect (meaning this test can be used) for the duration of the COVID-19 declaration under Section 564(b)(1) of the Act, 21 U.S.C. section 360bbb-3(b)(1), unless the authorization is terminated or revoked sooner.    Influenza A by PCR NEGATIVE NEGATIVE Final   Influenza B by PCR NEGATIVE NEGATIVE Final    Comment: (NOTE) The Xpert Xpress SARS-CoV-2/FLU/RSV assay is intended as an aid in  the diagnosis of influenza from Nasopharyngeal swab specimens and  should not be used as a sole basis for treatment. Nasal washings and  aspirates are unacceptable for Xpert Xpress SARS-CoV-2/FLU/RSV  testing. Fact Sheet for Patients: PinkCheek.be Fact Sheet for Healthcare Providers: GravelBags.it This test is not yet approved or cleared by the [place] FDA and  has been authorized for detection and/or diagnosis of SARS-CoV-2 by  FDA under an Emergency Use Authorization (EUA). This EUA will remain  in effect (meaning this test can be used) for the duration of the  Covid-19 declaration under Section 564(b)(1) of the Act, 21  U.S.C. section 360bbb-3(b)(1), unless the authorization is  terminated or revoked. Performed at [facility], [place] [address], [place], [place] [address]   Culture, blood (Routine X 2) w Reflex to ID Panel     Status: None (Preliminary result)   Collection Time: 04/11/19  8:58 AM   Specimen: Left Antecubital; Blood  Result Value Ref Range Status   Specimen Description   Final    LEFT ANTECUBITAL BOTTLES DRAWN AEROBIC AND ANAEROBIC   Special Requests Blood Culture adequate volume  Final   Culture   Final    NO GROWTH < 24 HOURS Performed at [facility], [address], [place], [place] [address]    Report Status PENDING  Incomplete  Culture, blood (Routine X 2) w Reflex to ID Panel     Status:  None (Preliminary result)   Collection Time: 04/11/19  8:58 AM   Specimen: BLOOD LEFT FOREARM  Result Value Ref Range Status   Specimen Description   Final    BLOOD LEFT FOREARM BOTTLES DRAWN AEROBIC AND ANAEROBIC   Special Requests Blood Culture adequate volume  Final   Culture   Final    NO GROWTH < 24 HOURS Performed at [facility], [address], [place], [place] [address]    Report Status PENDING  Incomplete    Radiology Reports DG Chest Port 1 View  Result Date: 04/08/2019 CLINICAL DATA:  Hypoxia. Fever. EXAM: PORTABLE CHEST 1 VIEW COMPARISON:  04/01/2019 FINDINGS: Tracheostomy tube in good position. PICC tip is in good position just below the carina in the superior vena cava. The heart size is normal. Slight pulmonary vascular prominence without pulmonary edema. No effusions. No acute bone abnormality. IMPRESSION: Slight pulmonary vascular prominence. Electronically Signed   By: Lorriane Shire M.D.   On: 04/08/2019 13:35   DG Chest Port 1 View  Result Date: 04/01/2019 CLINICAL DATA:  Syncope EXAM: PORTABLE CHEST 1 VIEW COMPARISON:  03/17/2019 FINDINGS: Two frontal views of the chest demonstrates stable tracheostomy tube and right-sided PICC. Cardiac silhouette is unremarkable. No airspace disease, effusion, or pneumothorax. No acute bony abnormalities. IMPRESSION:  1. No acute intrathoracic process. Electronically Signed   By: Randa Ngo M.D.   On: 04/01/2019 15:04   DG Chest Portable 1 View  Result Date: 03/17/2019 CLINICAL DATA:  Fever. EXAM: PORTABLE CHEST 1 VIEW COMPARISON:  Radiograph 03/04/2019 FINDINGS: Tracheostomy tube tip at the thoracic inlet. Right upper extremity central line tip in the SVC. Streaky opacities at the right lung base are new from prior exam. Unchanged heart size and mediastinal contours. No pulmonary edema or pneumothorax. No large pleural effusion. No acute osseous abnormalities are seen. IMPRESSION: Streaky right lung base opacities, new from  radiographs earlier this month, favoring atelectasis but could represent pneumonia in the setting of fever. Electronically Signed   By: Keith Rake M.D.   On: 03/17/2019 15:54   ECHOCARDIOGRAM COMPLETE  Result Date: 04/03/2019    ECHOCARDIOGRAM REPORT   Patient Name:   KARIEM WOLFSON Date of Exam: 04/03/2019 Medical Rec #:  [id number]     Height:       72.0 in Accession #:    [id number]    Weight:       167.5 lb Date of Birth:  1960-05-18     BSA:          1.976 m Patient Age:    80 years      BP:           130/92 mmHg Patient Gender: M             HR:           74 bpm. Exam Location:  Inpatient Procedure: 2D Echo, Cardiac Doppler and Color Doppler Indications:    R55 Syncope  History:        Patient has prior history of Echocardiogram examinations, most                 recent 08/01/2018. CAD, Stroke; Risk Factors:Hypertension,                 Dyslipidemia and GERD. Cancer.  Sonographer:    Tiffany Dance Referring Phys: [id number] [place]  1. Normal LV systolic function; grade 1 diastolic dysfunction; mild LVH; moderately to severely dilated aortic root; suggest CTA or MRA to further assess; trace AI.  2. Left ventricular ejection fraction, by estimation, is 55 to 60%. The left ventricle has normal function. The left ventricle has no regional wall motion abnormalities. There is mild left ventricular hypertrophy. Left ventricular diastolic parameters are consistent with Grade I diastolic dysfunction (impaired relaxation).  3. Right ventricular systolic function is normal. The right ventricular size is normal. There is mildly elevated pulmonary artery systolic pressure.  4. The mitral valve is normal in structure. Trivial mitral valve regurgitation. No evidence of mitral stenosis.  5. The aortic valve is tricuspid. Aortic valve regurgitation is trivial. No aortic stenosis is present.  6. Aortic dilatation noted. There is moderate to severe dilatation of the aortic root measuring 48 mm.  7. The  inferior vena cava is dilated in size with >50% respiratory variability, suggesting right atrial pressure of 8 mmHg. FINDINGS  Left Ventricle: Left ventricular ejection fraction, by estimation, is 55 to 60%. The left ventricle has normal function. The left ventricle has no regional wall motion abnormalities. The left ventricular internal cavity size was normal in size. There is  mild left ventricular hypertrophy. Left ventricular diastolic parameters are consistent with Grade I diastolic dysfunction (impaired relaxation). Right Ventricle: The right ventricular size is normal. Right ventricular systolic function is  normal. There is mildly elevated pulmonary artery systolic pressure. The tricuspid regurgitant velocity is 2.52 m/s, and with an assumed right atrial pressure of 8 mmHg, the estimated right ventricular systolic pressure is 85.2 mmHg. Left Atrium: Left atrial size was normal in size. Right Atrium: Right atrial size was normal in size. Pericardium: There is no evidence of pericardial effusion. Mitral Valve: The mitral valve is normal in structure. Normal mobility of the mitral valve leaflets. Trivial mitral valve regurgitation. No evidence of mitral valve stenosis. Tricuspid Valve: The tricuspid valve is normal in structure. Tricuspid valve regurgitation is mild . No evidence of tricuspid stenosis. Aortic Valve: The aortic valve is tricuspid. Aortic valve regurgitation is trivial. Aortic regurgitation PHT measures 400 msec. No aortic stenosis is present. Pulmonic Valve: The pulmonic valve was normal in structure. Pulmonic valve regurgitation is trivial. No evidence of pulmonic stenosis. Aorta: The aortic root is normal in size and structure and aortic dilatation noted. There is moderate to severe dilatation of the aortic root measuring 48 mm. Venous: The inferior vena cava is dilated in size with greater than 50% respiratory variability, suggesting right atrial pressure of 8 mmHg. IAS/Shunts: No atrial level  shunt detected by color flow Doppler. Additional Comments: Normal LV systolic function; grade 1 diastolic dysfunction; mild LVH; moderately to severely dilated aortic root; suggest CTA or MRA to further assess; trace AI.  LEFT VENTRICLE PLAX 2D LVIDd:         4.10 cm  Diastology LVIDs:         2.60 cm  LV e' lateral:   7.83 cm/s LV PW:         1.50 cm  LV E/e' lateral: 7.1 LV IVS:        1.30 cm  LV e' medial:    6.74 cm/s LVOT diam:     2.30 cm  LV E/e' medial:  8.2 LV SV:         72 LV SV Index:   36 LVOT Area:     4.15 cm  RIGHT VENTRICLE             IVC RV Basal diam:  2.80 cm     IVC diam: 2.40 cm RV S prime:     10.00 cm/s TAPSE (M-mode): 1.6 cm LEFT ATRIUM             Index       RIGHT ATRIUM           Index LA diam:        4.30 cm 2.18 cm/m  RA Area:     15.30 cm LA Vol (A2C):   50.6 ml 25.61 ml/m RA Volume:   36.70 ml  18.57 ml/m LA Vol (A4C):   36.2 ml 18.32 ml/m LA Biplane Vol: 44.3 ml 22.42 ml/m  AORTIC VALVE LVOT Vmax:   103.00 cm/s LVOT Vmean:  57.200 cm/s LVOT VTI:    0.173 m AI PHT:      400 msec  AORTA Ao Root diam: 4.80 cm Ao Asc diam:  3.40 cm MITRAL VALVE               TRICUSPID VALVE MV Area (PHT): 2.91 cm    TR Peak grad:   25.4 mmHg MV Decel Time: 261 msec    TR Vmax:        252.00 cm/s MV E velocity: 55.50 cm/s MV A velocity: 74.40 cm/s  SHUNTS MV E/A ratio:  0.75  Systemic VTI:  0.17 m                            Systemic Diam: 2.30 cm Kirk Ruths MD Electronically signed by Kirk Ruths MD Signature Date/Time: 04/03/2019/3:11:19 PM    Final      CBC Recent Labs  Lab 04/08/19 1309 04/10/19 0538 04/12/19 0641  WBC 4.2 3.6* 4.6  HGB 9.4* 8.2* 8.9*  HCT 28.4* 25.9* 27.7*  PLT 125* 122* 156  MCV 94.4 97.0 94.5  MCH 31.2 30.7 30.4  MCHC 33.1 31.7 32.1  RDW 15.7* 15.9* 15.4  LYMPHSABS 0.1*  --   --   MONOABS 0.2  --   --   EOSABS 0.0  --   --   BASOSABS 0.0  --   --     Chemistries  Recent Labs  Lab 04/08/19 1309 04/08/19 1827 04/10/19 0538  04/12/19 0641  NA 126*  --  135 137  K 3.4*  --  3.7 3.5  CL 94*  --  102 100  CO2 23  --  27 27  GLUCOSE 152*  --  114* 116*  BUN 16  --  18 10  CREATININE 0.95  --  0.63 0.69  CALCIUM 7.7*  --  8.1* 8.4*  MG  --  1.6*  --   --   AST 20  --   --   --   ALT 16  --   --   --   ALKPHOS 47  --   --   --   BILITOT 0.7  --   --   --    ------------------------------------------------------------------------------------------------------------------ No results for input(s): CHOL, HDL, LDLCALC, TRIG, CHOLHDL, LDLDIRECT in the last 72 hours.  Lab Results  Component Value Date   HGBA1C 5.4 08/01/2018   ------------------------------------------------------------------------------------------------------------------ No results for input(s): TSH, T4TOTAL, T3FREE, THYROIDAB in the last 72 hours.  Invalid input(s): FREET3 ------------------------------------------------------------------------------------------------------------------ No results for input(s): VITAMINB12, FOLATE, FERRITIN, TIBC, IRON, RETICCTPCT in the last 72 hours.  Coagulation profile Recent Labs  Lab 04/08/19 1309  INR 1.1    No results for input(s): DDIMER in the last 72 hours.  Cardiac Enzymes No results for input(s): CKMB, TROPONINI, MYOGLOBIN in the last 168 hours.  Invalid input(s): CK ------------------------------------------------------------------------------------------------------------------ No results found for: BNP   Roxan Hockey M.D on 04/12/2019 at 7:19 PM  Go to [email or web address] - for contact info  [facility] - Office  [phone]

## 2019-04-13 DIAGNOSIS — J189 Pneumonia, unspecified organism: Secondary | ICD-10-CM | POA: Diagnosis not present

## 2019-04-13 LAB — CULTURE, BLOOD (ROUTINE X 2): Special Requests: ADEQUATE

## 2019-04-13 MED ORDER — OSMOLITE 1.5 CAL PO LIQD
237.0000 mL | Freq: Four times a day (QID) | ORAL | Status: DC
  Administered 2019-04-13 – 2019-04-14 (×6): 237 mL

## 2019-04-13 NOTE — Progress Notes (Addendum)
Patient receiving Osomolite 1.5 Cal at 30 ml per hour. Patient also ordered to receive Osmolite 1.5 Cal bolus feed of 1000 ml every 6 hours. Patient refusing scheduled Pro-Stat. MD notified. Instructed by MD to continue dietician recommendations of Osmolite 1.5 cal at 30 ml per hour with bolus feeds changed to 237 ml every six hours.

## 2019-04-13 NOTE — Progress Notes (Signed)
PHARMACY CONSULT NOTE FOR:  OUTPATIENT  PARENTERAL ANTIBIOTIC THERAPY (OPAT)  Indication: Bacteremia Regimen: Ceftriaxone 2000 mg IV every 24 hours End date: Last day of therapy 04/17/19  IV antibiotic discharge orders are pended. To discharging provider:  please sign these orders via discharge navigator,  Select New Orders & click on the button choice - Manage This Unsigned Work.     Thank you for allowing pharmacy to be a part of this patient's care.  Ramond Craver 04/13/2019, 10:17 AM

## 2019-04-13 NOTE — Progress Notes (Signed)
Patient Demographics:    [name], is a 59 y.o. male, DOB - 1960-01-13, DXI:[id number]  Admit date - 04/08/2019   Admitting Physician Barton Dubois, MD  Outpatient Primary MD for the patient is [name], MD  LOS - 5   Chief Complaint  Patient presents with  . Fever        Subjective:    [name] today has no emesis,  No chest pain,    - No chills or rigors -Tolerating tube feeding well -Had one episode of loose stool   Assessment  & Plan :    Principal Problem:   HCAP (healthcare-associated pneumonia) Active Problems:   Hypertension   Alcohol abuse, daily use   Hyperlipidemia   Hyponatremia   Stroke [facility])   Malignant neoplasm of supraglottis (HCC)   Hx of tracheostomy   Head and neck cancer (HCC)   Malnutrition of moderate degree   Febrile illness, acute  Brief Summary:- 59 y.o. male with past medical history significant for malignant neoplasm of supraglottis, status post tracheostomy, actively receiving chemotherapy and radiation; dysphagia status post PEG tube placement, history of coronary artery disease a status post PCI with stenting in 2014, history of nonhemorrhagic CVA, chronic lower back pain, essential hypertension, hyperlipidemia, recent admission secondary to acute blood loss anemia presumed to be secondary to upper GI bleed (04/01/19--04/05/19); who presented to the emergency department secondary to worsening shortness of breath, generalized weakness and productive cough.  Patient also expressed having fevers at home and not feeling well.  Denies chest pain, no nausea, no vomiting, no dysuria, no hematuria, no melena, no hematochezia, no focal deficits or any other complaints.  In the ED work-up demonstrated chest x-ray at with right lower lobe atelectasis, temperature of 101.6 and hyponatremia (slightly worse than his baseline) and thrombocytopenia.  Initially was a  slightly tachypneic with subsequent improvement by time of my examination.  2 L of oxygen supplementation in place.  Cultures taken, antibiotics initiated and TRH contacted 20 patient for further evaluation and management.  There is concern for HCAP as reason for his fevers.  A/p 1) bacteremia and sepsis secondary to HCAP-blood cultures from 04/08/19 with Klebsiella and Serratia marcescens-both organisms mostly pansensitive (No ESBL)  -Discussed with Dr. Lita Mains  from infectious disease -Antimicrobials this admission:  cefepime 4/6 >>4/9 meropenem 4/9>>04/12/19 Rocephin 2gm iv 04/12/19 -Right arm PICC line pulled on 04/12/19 -Patient will receive a total of 14 days of antibiotics from negative culture date of 04/11/2019 with 7 days of IV through 04/17/2019 followed by 7 days of p.o. Levaquin as noted below -Plan is to treat with IV Rocephin f through 04/17/2019 inclusive followed by p.o. Levaquin 750 mg daily for 7 days to complete 14 days of antibiotic regimen -Blood cultures from 04/11/2019 NGTD -Patient is status post prior tracheostomy -Continue bronchodilators and humidifier to trach -Patient is immunocompromise due to ongoing chemo/radiation therapy -Patient will need OPAT, iv Rocephin can hopefully be done through peripheral line  2) history of malignancy of the supraglottis/head and neck cancer --- PTA was undergoing chemo and radiation -Leukopenia improving noted in the setting of chemoradiation therapy  3)/FEN/Tube Feeds--- PTA he was on Osmolite 1.5cal 52fl oz he takes 3-4x/day and is supposed to work up  to 7x/day and prosource 15g protein -Dietitian consult with recommendations appreciated for tube feeds via PEG tube  4) history of prior stroke--- continue aspirin and Lipitor 5)Hyponatremia--largely asymptomatic, sodium is up to 137 after water flushes via PEG tube as adjusted -Decrease NS to [facility]  Disposition/Need for in-Hospital Stay- patient unable to be discharged at this time due  to --pneumonia and immunocompromise patients requiring IV antibiotics  -Patient From: From home with wife D/C Place: Back home with wife Barriers: Not Clinically Stable-immunocompromised individual with bacteremia and HCAP requiring IV antibiotics  -Anticipate discharge home on 04/14/2018 with IV antibiotics by home health agency  Code Status : Full code  Family Communication:     (patient is alert, awake and coherent) -Discussed with patient's wife at bedside  Consults  : Phone consult with ID physician Dr. Bobby Rumpf  DVT Prophylaxis  :   - SCDs--thrombocytopenia  Lab Results  Component Value Date   PLT 156 04/12/2019    Inpatient Medications  Scheduled Meds: . aspirin  81 mg Oral Daily  . atorvastatin  80 mg Oral QPM  . Chlorhexidine Gluconate Cloth  6 each Topical Daily  . feeding supplement (OSMOLITE 1.5 CAL)  237 mL Per Tube Q6H  . feeding supplement (PRO-STAT SUGAR FREE 64)  30 mL Per Tube QHS  . free water  100 mL Per Tube Q6H  . lidocaine  15 mL Mouth/Throat TID AC & HS  . pantoprazole sodium  40 mg Per Tube Daily  . sodium chloride flush  10-40 mL Intracatheter Q12H  . traZODone  100 mg Oral QHS   Continuous Infusions: . sodium chloride 20 mL/hr at 04/12/19 2009  . cefTRIAXone (ROCEPHIN)  IV 2 g (04/12/19 1652)  . feeding supplement (OSMOLITE 1.5 CAL) 1,000 mL (04/12/19 1128)   PRN Meds:.acetaminophen **OR** acetaminophen, HYDROcodone-acetaminophen, ipratropium-albuterol, nitroGLYCERIN, ondansetron **OR** ondansetron (ZOFRAN) IV, sodium chloride flush    Anti-infectives (From admission, onward)   Start     Dose/Rate Route Frequency Ordered Stop   04/12/19 1600  cefTRIAXone (ROCEPHIN) 2 g in sodium chloride 0.9 % 100 mL IVPB     2 g 200 mL/hr over 30 Minutes Intravenous Every 24 hours 04/12/19 1531     04/11/19 1600  meropenem (MERREM) 1 g in sodium chloride 0.9 % 100 mL IVPB  Status:  Discontinued     1 g 200 mL/hr over 30 Minutes Intravenous Every 8  hours 04/11/19 1502 04/12/19 1531   04/08/19 2200  ceFEPIme (MAXIPIME) 2 g in sodium chloride 0.9 % 100 mL IVPB  Status:  Discontinued     2 g 200 mL/hr over 30 Minutes Intravenous Every 8 hours 04/08/19 1821 04/11/19 1444   04/08/19 1300  ceFEPIme (MAXIPIME) 2 g in sodium chloride 0.9 % 100 mL IVPB     2 g 200 mL/hr over 30 Minutes Intravenous  Once 04/08/19 1258 04/08/19 1400        Objective:   Vitals:   04/12/19 2002 04/13/19 0530 04/13/19 0701 04/13/19 0832  BP: (!) 160/91 (!) 140/114 (!) 142/91   Pulse: 80 79    Resp: 16 16    Temp: 99.9 F (37.7 C) 99.1 F (37.3 C)    TempSrc: Oral Oral    SpO2: 93% 98%  91%  Weight:      Height:        Wt Readings from Last 3 Encounters:  04/08/19 75.9 kg  04/01/19 76 kg  03/27/19 76 kg     Intake/Output Summary (  Last 24 hours) at 04/13/2019 1012 Last data filed at 04/13/2019 0335 Gross per 24 hour  Intake 6164.02 ml  Output 1900 ml  Net 4264.02 ml     Physical Exam  Gen:- Awake Alert, shaking chills from time to time  HEENT:- [place].AT, No sclera icterus Neck-tracheostomy  lungs-improved air movement, no wheezing  CV- S1, S2 normal, regular  Abd-  +ve B.Sounds, Abd Soft, No tenderness, PEG tube site is clean dry and intact Extremity/Skin:- No  edema, pedal pulses present  Psych-affect is appropriate, oriented x3 Neuro-generalized weakness no new focal deficits, no tremors MSK-right arm PICC line removed on 04/12/2019   Data Review:   Micro Results Recent Results (from the past 240 hour(s))  Urine culture     Status: None   Collection Time: 04/08/19 12:58 PM   Specimen: In/Out Cath Urine  Result Value Ref Range Status   Specimen Description   Final    IN/OUT CATH URINE Performed at [facility], [address] [place], [place] [address]    Special Requests   Final    NONE Performed at [facility], [address] [place], [place] [address]    Culture   Final    NO GROWTH Performed at [facility],  [place] [address], [place], [place] [address]    Report Status 04/09/2019 FINAL  Final  Blood Culture (routine x 2)     Status: Abnormal (Preliminary result)   Collection Time: 04/08/19  1:09 PM   Specimen: BLOOD  Result Value Ref Range Status   Specimen Description   Final    BLOOD LEFT ANTECUBITAL Performed at [facility], [address], [place], [place] [address]    Special Requests   Final    Blood Culture adequate volume Performed at [facility], [address], [place], [place] [address]    Culture  Setup Time   Final    IN BOTH AEROBIC AND ANAEROBIC BOTTLES GRAM NEGATIVE RODS Gram Stain Report Called to,Read Back By and Verified With: A [facility] @0128  04/09/19 [facility] Performed at [facility], [address] [place], [place] [address]    Culture SERRATIA MARCESCENS (A)  Final   Report Status PENDING  Incomplete   Organism ID, Bacteria SERRATIA MARCESCENS  Final      Susceptibility   Serratia marcescens - MIC*    CEFAZOLIN >=64 RESISTANT Resistant     CEFEPIME <=0.12 SENSITIVE Sensitive     CEFTAZIDIME <=1 SENSITIVE Sensitive     CEFTRIAXONE <=0.25 SENSITIVE Sensitive     CIPROFLOXACIN <=0.25 SENSITIVE Sensitive     GENTAMICIN <=1 SENSITIVE Sensitive     TRIMETH/SULFA <=20 SENSITIVE Sensitive     * SERRATIA MARCESCENS  Blood Culture (routine x 2)     Status: Abnormal   Collection Time: 04/08/19  1:09 PM   Specimen: BLOOD  Result Value Ref Range Status   Specimen Description   Final    BLOOD PICC LINE DRAWN BY RN Performed at [facility], [address], [place], [place] [address]    Special Requests   Final    Blood Culture results may not be optimal due to an inadequate volume of blood received in culture bottles Performed at [facility], [address] [place],  [address]    Culture  Setup Time   Final    IN BOTH AEROBIC AND ANAEROBIC BOTTLES GRAM NEGATIVE RODS Gram Stain Report Called to,Read Back By and Verified With: A MUHAMMAD,RN @0130  04/09/19  MKELLY CRITICAL RESULT CALLED  TO, READ BACK BY AND VERIFIED WITH: PHATMD J MINDENHALL [id number] AT 724 AM [facility] CM    Culture (A)  Final    KLEBSIELLA PNEUMONIAE SERRATIA MARCESCENS SUSCEPTIBILITIES PERFORMED ON PREVIOUS CULTURE WITHIN THE LAST 5 DAYS. Performed at [facility], [place] [address] [place], [place] [address]    Report Status 04/12/2019 FINAL  Final   Organism ID, Bacteria KLEBSIELLA PNEUMONIAE  Final      Susceptibility   Klebsiella pneumoniae - MIC*    AMPICILLIN RESISTANT Resistant     CEFAZOLIN <=4 SENSITIVE Sensitive     CEFEPIME <=0.12 SENSITIVE Sensitive     CEFTAZIDIME <=1 SENSITIVE Sensitive     CEFTRIAXONE <=0.25 SENSITIVE Sensitive     CIPROFLOXACIN <=0.25 SENSITIVE Sensitive     GENTAMICIN <=1 SENSITIVE Sensitive     IMIPENEM <=0.25 SENSITIVE Sensitive     TRIMETH/SULFA <=20 SENSITIVE Sensitive     AMPICILLIN/SULBACTAM 4 SENSITIVE Sensitive     PIP/TAZO <=4 SENSITIVE Sensitive     * KLEBSIELLA PNEUMONIAE  Blood Culture ID Panel (Reflexed)     Status: Abnormal   Collection Time: 04/08/19  1:09 PM  Result Value Ref Range Status   Enterococcus species NOT DETECTED NOT DETECTED Final   Listeria monocytogenes NOT DETECTED NOT DETECTED Final   Staphylococcus species NOT DETECTED NOT DETECTED Final   Staphylococcus aureus (BCID) NOT DETECTED NOT DETECTED Final   Streptococcus species NOT DETECTED NOT DETECTED Final   Streptococcus agalactiae NOT DETECTED NOT DETECTED Final   Streptococcus pneumoniae NOT DETECTED NOT DETECTED Final   Streptococcus pyogenes NOT DETECTED NOT DETECTED Final   Acinetobacter baumannii NOT DETECTED NOT DETECTED Final   Enterobacteriaceae species DETECTED (A) NOT DETECTED Final    Comment: CRITICAL RESULT CALLED TO, READ BACK BY AND VERIFIED WITH: PHARMD J MINDENHALL [id number] AT 7241 AM BY CM    Enterobacter cloacae complex NOT DETECTED NOT DETECTED Final   Escherichia coli NOT DETECTED NOT DETECTED Final   Klebsiella oxytoca NOT  DETECTED NOT DETECTED Final   Klebsiella pneumoniae DETECTED (A) NOT DETECTED Final    Comment: CRITICAL RESULT CALLED TO, READ BACK BY AND VERIFIED WITH: PHARMD J MINDENHALL [id number] AT 0724 BY CM    Proteus species NOT DETECTED NOT DETECTED Final   Serratia marcescens DETECTED (A) NOT DETECTED Final    Comment: CRITICAL RESULT CALLED TO, READ BACK BY AND VERIFIED WITH: PHARMD J MINDENHALL [id number] AT 724 AM BY CM    Carbapenem resistance NOT DETECTED NOT DETECTED Final   Haemophilus influenzae NOT DETECTED NOT DETECTED Final   Neisseria meningitidis NOT DETECTED NOT DETECTED Final   Pseudomonas aeruginosa NOT DETECTED NOT DETECTED Final   Candida albicans NOT DETECTED NOT DETECTED Final   Candida glabrata NOT DETECTED NOT DETECTED Final   Candida krusei NOT DETECTED NOT DETECTED Final   Candida parapsilosis NOT DETECTED NOT DETECTED Final   Candida tropicalis NOT DETECTED NOT DETECTED Final    Comment: Performed at [facility], [place], [place], [place] [address]  Respiratory Panel by RT PCR (Flu A&B, Covid) - Nasopharyngeal Swab     Status: None   Collection Time: 04/08/19  6:14 PM   Specimen: Nasopharyngeal Swab  Result Value Ref Range Status   SARS Coronavirus 2 by RT PCR NEGATIVE NEGATIVE Final    Comment: (NOTE) SARS-CoV-2 target nucleic acids are NOT DETECTED. The SARS-CoV-2 RNA is generally detectable in upper respiratoy specimens during the acute phase of infection. The lowest concentration of SARS-CoV-2 viral  copies this assay can detect is 131 copies/mL. A negative result does not preclude SARS-Cov-2 infection and should not be used as the sole basis for treatment or other patient management decisions. A negative result may occur with  improper specimen collection/handling, submission of specimen other than nasopharyngeal swab, presence of viral mutation(s) within the areas targeted by this assay, and inadequate number of viral copies (<131 copies/mL). A  negative result must be combined with clinical observations, patient history, and epidemiological information. The expected result is Negative. Fact Sheet for Patients:  PinkCheek.be Fact Sheet for Healthcare Providers:  GravelBags.it This test is not yet ap proved or cleared by the [place] FDA and  has been authorized for detection and/or diagnosis of SARS-CoV-2 by FDA under an Emergency Use Authorization (EUA). This EUA will remain  in effect (meaning this test can be used) for the duration of the COVID-19 declaration under Section 564(b)(1) of the Act, 21 U.S.C. section 360bbb-3(b)(1), unless the authorization is terminated or revoked sooner.    Influenza A by PCR NEGATIVE NEGATIVE Final   Influenza B by PCR NEGATIVE NEGATIVE Final    Comment: (NOTE) The Xpert Xpress SARS-CoV-2/FLU/RSV assay is intended as an aid in  the diagnosis of influenza from Nasopharyngeal swab specimens and  should not be used as a sole basis for treatment. Nasal washings and  aspirates are unacceptable for Xpert Xpress SARS-CoV-2/FLU/RSV  testing. Fact Sheet for Patients: PinkCheek.be Fact Sheet for Healthcare Providers: GravelBags.it This test is not yet approved or cleared by the [place] FDA and  has been authorized for detection and/or diagnosis of SARS-CoV-2 by  FDA under an Emergency Use Authorization (EUA). This EUA will remain  in effect (meaning this test can be used) for the duration of the  Covid-19 declaration under Section 564(b)(1) of the Act, 21  U.S.C. section 360bbb-3(b)(1), unless the authorization is  terminated or revoked. Performed at [facility], [place] [address] [place], [place] [address]   Culture, blood (Routine X 2) w Reflex to ID Panel     Status: None (Preliminary result)   Collection Time: 04/11/19  8:58 AM   Specimen: Left Antecubital; Blood   Result Value Ref Range Status   Specimen Description   Final    LEFT ANTECUBITAL BOTTLES DRAWN AEROBIC AND ANAEROBIC   Special Requests Blood Culture adequate volume  Final   Culture   Final    NO GROWTH < 24 HOURS Performed at [facility], [address] [place], [place] [address]    Report Status PENDING  Incomplete  Culture, blood (Routine X 2) w Reflex to ID Panel     Status: None (Preliminary result)   Collection Time: 04/11/19  8:58 AM   Specimen: BLOOD LEFT FOREARM  Result Value Ref Range Status   Specimen Description   Final    BLOOD LEFT FOREARM BOTTLES DRAWN AEROBIC AND ANAEROBIC   Special Requests Blood Culture adequate volume  Final   Culture   Final    NO GROWTH < 24 HOURS Performed at [facility], [address] [place], [place] [address]    Report Status PENDING  Incomplete    Radiology Reports DG Chest Port 1 View  Result Date: 04/08/2019 CLINICAL DATA:  Hypoxia. Fever. EXAM: PORTABLE CHEST 1 VIEW COMPARISON:  04/01/2019 FINDINGS: Tracheostomy tube in good position. PICC tip is in good position just below the carina in the superior vena cava. The heart size is normal. Slight pulmonary vascular prominence without pulmonary edema. No  effusions. No acute bone abnormality. IMPRESSION: Slight pulmonary vascular prominence. Electronically Signed   By: Lorriane Shire M.D.   On: 04/08/2019 13:35   DG Chest Port 1 View  Result Date: 04/01/2019 CLINICAL DATA:  Syncope EXAM: PORTABLE CHEST 1 VIEW COMPARISON:  03/17/2019 FINDINGS: Two frontal views of the chest demonstrates stable tracheostomy tube and right-sided PICC. Cardiac silhouette is unremarkable. No airspace disease, effusion, or pneumothorax. No acute bony abnormalities. IMPRESSION: 1. No acute intrathoracic process. Electronically Signed   By: Randa Ngo M.D.   On: 04/01/2019 15:04   DG Chest Portable 1 View  Result Date: 03/17/2019 CLINICAL DATA:  Fever. EXAM: PORTABLE CHEST 1 VIEW COMPARISON:  Radiograph  03/04/2019 FINDINGS: Tracheostomy tube tip at the thoracic inlet. Right upper extremity central line tip in the SVC. Streaky opacities at the right lung base are new from prior exam. Unchanged heart size and mediastinal contours. No pulmonary edema or pneumothorax. No large pleural effusion. No acute osseous abnormalities are seen. IMPRESSION: Streaky right lung base opacities, new from radiographs earlier this month, favoring atelectasis but could represent pneumonia in the setting of fever. Electronically Signed   By: Keith Rake M.D.   On: 03/17/2019 15:54   ECHOCARDIOGRAM COMPLETE  Result Date: 04/03/2019    ECHOCARDIOGRAM REPORT   Patient Name:   ELUZER HOWDESHELL Date of Exam: 04/03/2019 Medical Rec #:  [id number]     Height:       72.0 in Accession #:    [id number]    Weight:       167.5 lb Date of Birth:  Nov 17, 1960     BSA:          1.976 m Patient Age:    39 years      BP:           130/92 mmHg Patient Gender: M             HR:           74 bpm. Exam Location:  Inpatient Procedure: 2D Echo, Cardiac Doppler and Color Doppler Indications:    R55 Syncope  History:        Patient has prior history of Echocardiogram examinations, most                 recent 08/01/2018. CAD, Stroke; Risk Factors:Hypertension,                 Dyslipidemia and GERD. Cancer.  Sonographer:    Tiffany Dance Referring Phys: [id number] Newman  1. Normal LV systolic function; grade 1 diastolic dysfunction; mild LVH; moderately to severely dilated aortic root; suggest CTA or MRA to further assess; trace AI.  2. Left ventricular ejection fraction, by estimation, is 55 to 60%. The left ventricle has normal function. The left ventricle has no regional wall motion abnormalities. There is mild left ventricular hypertrophy. Left ventricular diastolic parameters are consistent with Grade I diastolic dysfunction (impaired relaxation).  3. Right ventricular systolic function is normal. The right ventricular size is normal.  There is mildly elevated pulmonary artery systolic pressure.  4. The mitral valve is normal in structure. Trivial mitral valve regurgitation. No evidence of mitral stenosis.  5. The aortic valve is tricuspid. Aortic valve regurgitation is trivial. No aortic stenosis is present.  6. Aortic dilatation noted. There is moderate to severe dilatation of the aortic root measuring 48 mm.  7. The inferior vena cava is dilated in size with >50% respiratory variability, suggesting right atrial  pressure of 8 mmHg. FINDINGS  Left Ventricle: Left ventricular ejection fraction, by estimation, is 55 to 60%. The left ventricle has normal function. The left ventricle has no regional wall motion abnormalities. The left ventricular internal cavity size was normal in size. There is  mild left ventricular hypertrophy. Left ventricular diastolic parameters are consistent with Grade I diastolic dysfunction (impaired relaxation). Right Ventricle: The right ventricular size is normal. Right ventricular systolic function is normal. There is mildly elevated pulmonary artery systolic pressure. The tricuspid regurgitant velocity is 2.52 m/s, and with an assumed right atrial pressure of 8 mmHg, the estimated right ventricular systolic pressure is 12.8 mmHg. Left Atrium: Left atrial size was normal in size. Right Atrium: Right atrial size was normal in size. Pericardium: There is no evidence of pericardial effusion. Mitral Valve: The mitral valve is normal in structure. Normal mobility of the mitral valve leaflets. Trivial mitral valve regurgitation. No evidence of mitral valve stenosis. Tricuspid Valve: The tricuspid valve is normal in structure. Tricuspid valve regurgitation is mild . No evidence of tricuspid stenosis. Aortic Valve: The aortic valve is tricuspid. Aortic valve regurgitation is trivial. Aortic regurgitation PHT measures 400 msec. No aortic stenosis is present. Pulmonic Valve: The pulmonic valve was normal in structure. Pulmonic  valve regurgitation is trivial. No evidence of pulmonic stenosis. Aorta: The aortic root is normal in size and structure and aortic dilatation noted. There is moderate to severe dilatation of the aortic root measuring 48 mm. Venous: The inferior vena cava is dilated in size with greater than 50% respiratory variability, suggesting right atrial pressure of 8 mmHg. IAS/Shunts: No atrial level shunt detected by color flow Doppler. Additional Comments: Normal LV systolic function; grade 1 diastolic dysfunction; mild LVH; moderately to severely dilated aortic root; suggest CTA or MRA to further assess; trace AI.  LEFT VENTRICLE PLAX 2D LVIDd:         4.10 cm  Diastology LVIDs:         2.60 cm  LV e' lateral:   7.83 cm/s LV PW:         1.50 cm  LV E/e' lateral: 7.1 LV IVS:        1.30 cm  LV e' medial:    6.74 cm/s LVOT diam:     2.30 cm  LV E/e' medial:  8.2 LV SV:         72 LV SV Index:   36 LVOT Area:     4.15 cm  RIGHT VENTRICLE             IVC RV Basal diam:  2.80 cm     IVC diam: 2.40 cm RV S prime:     10.00 cm/s TAPSE (M-mode): 1.6 cm LEFT ATRIUM             Index       RIGHT ATRIUM           Index LA diam:        4.30 cm 2.18 cm/m  RA Area:     15.30 cm LA Vol (A2C):   50.6 ml 25.61 ml/m RA Volume:   36.70 ml  18.57 ml/m LA Vol (A4C):   36.2 ml 18.32 ml/m LA Biplane Vol: 44.3 ml 22.42 ml/m  AORTIC VALVE LVOT Vmax:   103.00 cm/s LVOT Vmean:  57.200 cm/s LVOT VTI:    0.173 m AI PHT:      400 msec  AORTA Ao Root diam: 4.80 cm Ao Asc diam:  3.40  cm MITRAL VALVE               TRICUSPID VALVE MV Area (PHT): 2.91 cm    TR Peak grad:   25.4 mmHg MV Decel Time: 261 msec    TR Vmax:        252.00 cm/s MV E velocity: 55.50 cm/s MV A velocity: 74.40 cm/s  SHUNTS MV E/A ratio:  0.75        Systemic VTI:  0.17 m                            Systemic Diam: 2.30 cm Kirk Ruths MD Electronically signed by Kirk Ruths MD Signature Date/Time: 04/03/2019/3:11:19 PM    Final      CBC Recent Labs  Lab 04/08/19 1309  04/10/19 0538 04/12/19 0641  WBC 4.2 3.6* 4.6  HGB 9.4* 8.2* 8.9*  HCT 28.4* 25.9* 27.7*  PLT 125* 122* 156  MCV 94.4 97.0 94.5  MCH 31.2 30.7 30.4  MCHC 33.1 31.7 32.1  RDW 15.7* 15.9* 15.4  LYMPHSABS 0.1*  --   --   MONOABS 0.2  --   --   EOSABS 0.0  --   --   BASOSABS 0.0  --   --     Chemistries  Recent Labs  Lab 04/08/19 1309 04/08/19 1827 04/10/19 0538 04/12/19 0641  NA 126*  --  135 137  K 3.4*  --  3.7 3.5  CL 94*  --  102 100  CO2 23  --  27 27  GLUCOSE 152*  --  114* 116*  BUN 16  --  18 10  CREATININE 0.95  --  0.63 0.69  CALCIUM 7.7*  --  8.1* 8.4*  MG  --  1.6*  --   --   AST 20  --   --   --   ALT 16  --   --   --   ALKPHOS 47  --   --   --   BILITOT 0.7  --   --   --    ------------------------------------------------------------------------------------------------------------------ No results for input(s): CHOL, HDL, LDLCALC, TRIG, CHOLHDL, LDLDIRECT in the last 72 hours.  Lab Results  Component Value Date   HGBA1C 5.4 08/01/2018   ------------------------------------------------------------------------------------------------------------------ No results for input(s): TSH, T4TOTAL, T3FREE, THYROIDAB in the last 72 hours.  Invalid input(s): FREET3 ------------------------------------------------------------------------------------------------------------------ No results for input(s): VITAMINB12, FOLATE, FERRITIN, TIBC, IRON, RETICCTPCT in the last 72 hours.  Coagulation profile Recent Labs  Lab 04/08/19 1309  INR 1.1    No results for input(s): DDIMER in the last 72 hours.  Cardiac Enzymes No results for input(s): CKMB, TROPONINI, MYOGLOBIN in the last 168 hours.  Invalid input(s): CK ------------------------------------------------------------------------------------------------------------------ No results found for: BNP   Roxan Hockey M.D on 04/13/2019 at 10:12 AM  Go to [email or web address] - for contact info  [facility]  - Office  [phone]

## 2019-04-14 ENCOUNTER — Ambulatory Visit: Payer: 59

## 2019-04-14 DIAGNOSIS — J189 Pneumonia, unspecified organism: Secondary | ICD-10-CM | POA: Diagnosis not present

## 2019-04-14 LAB — CBC
HCT: 26.9 % — ABNORMAL LOW (ref 39.0–52.0)
Hemoglobin: 8.6 g/dL — ABNORMAL LOW (ref 13.0–17.0)
MCH: 30.8 pg (ref 26.0–34.0)
MCHC: 32 g/dL (ref 30.0–36.0)
MCV: 96.4 fL (ref 80.0–100.0)
Platelets: 213 10*3/uL (ref 150–400)
RBC: 2.79 MIL/uL — ABNORMAL LOW (ref 4.22–5.81)
RDW: 15.6 % — ABNORMAL HIGH (ref 11.5–15.5)
WBC: 4.2 10*3/uL (ref 4.0–10.5)
nRBC: 0 % (ref 0.0–0.2)

## 2019-04-14 LAB — BASIC METABOLIC PANEL
Anion gap: 8 (ref 5–15)
BUN: 12 mg/dL (ref 6–20)
CO2: 27 mmol/L (ref 22–32)
Calcium: 8.2 mg/dL — ABNORMAL LOW (ref 8.9–10.3)
Chloride: 99 mmol/L (ref 98–111)
Creatinine, Ser: 0.63 mg/dL (ref 0.61–1.24)
GFR calc Af Amer: >60 mL/min (ref >60)
GFR calc non Af Amer: >60 mL/min (ref >60)
Glucose, Bld: 140 mg/dL — ABNORMAL HIGH (ref 70–99)
Potassium: 3.7 mmol/L (ref 3.5–5.1)
Sodium: 134 mmol/L — ABNORMAL LOW (ref 135–145)

## 2019-04-14 MED ORDER — ACETAMINOPHEN 325 MG PO TABS: 650.0000 mg | ORAL_TABLET | Freq: Four times a day (QID) | ORAL | 0 refills | Status: DC | PRN

## 2019-04-14 MED ORDER — PRO-STAT SUGAR FREE PO LIQD: 30.0000 mL | Freq: Three times a day (TID) | ORAL | 2 refills | Status: DC

## 2019-04-14 MED ORDER — POLYVINYL ALCOHOL 1.4 % OP SOLN
1.0000 [drp] | OPHTHALMIC | Status: DC | PRN
  Filled 2019-04-14: qty 15

## 2019-04-14 MED ORDER — CIPROFLOXACIN HCL 500 MG PO TABS: 500.0000 mg | ORAL_TABLET | Freq: Two times a day (BID) | ORAL | 0 refills | Status: AC

## 2019-04-14 MED ORDER — FREE WATER: 100.0000 mL | 23 refills | Status: DC

## 2019-04-14 MED ORDER — CEFTRIAXONE IV (FOR PTA / DISCHARGE USE ONLY): 2.0000 g | INTRAVENOUS | 0 refills | Status: AC

## 2019-04-14 NOTE — Progress Notes (Signed)
Nsg Discharge Note  Admit Date:  04/08/2019 Discharge date: 04/14/2019   [place] [facility] to be D/C'd Home per MD order.  AVS completed.   Patient/caregiver able to verbalize understanding.  Discharge Medication: Allergies as of 04/14/2019      Reactions   Duloxetine Other (See Comments)   Caused depression   Prednisone Other (See Comments)   Chest pain      Medication List    STOP taking these medications   CISPLATIN IV   Magnesium Oxide -Mg Supplement 400 MG Caps   sodium chloride 0.9 % infusion   sodium chloride irrigation 0.9 % irrigation     TAKE these medications   acetaminophen 325 MG tablet Commonly known as: TYLENOL Take 2 tablets (650 mg total) by mouth every 6 (six) hours as needed for mild pain, fever or headache (or Fever >/= 101).   [place] Suction Catheter 14FR Misc 1 Device by Does not apply route daily.   aspirin EC 81 MG tablet Take 81 mg by mouth daily.   atorvastatin 80 MG tablet Commonly known as: LIPITOR Take 1 tablet (80 mg total) by mouth every evening. What changed:   how much to take  when to take this   cefTRIAXone  IVPB Commonly known as: ROCEPHIN Inject 2 g into the vein daily for 3 days. Indication:  bacteremia Last Day of Therapy:  04/17/19 Labs - Once weekly:  CBC/D and BMP, Labs - Every other week:  ESR and CRP Start taking on: April 15, 2019   ciprofloxacin 500 MG tablet Commonly known as: CIPRO Take 1 tablet (500 mg total) by mouth 2 (two) times daily for 7 days. Starting Friday 04/18/2019 after you finish IV antibiotics Start taking on: April 18, 2019   feeding supplement (OSMOLITE 1.5 CAL) Liqd Give 2 cartons of osmolite 1.5 at 9:30am, 1:30, 5:30 and 1 carton at 9:30pm.  Flush with 35m of water before and 12107mafter each feeding. Meets 100% of patient needs. Send bolus supplies. What changed:   how much to take  how to take this  when to take this   ProSource TF Liqd Give 3069m times per day via feeding tube.  Mix  with 34m16m water.  Flush tube with 34ml9mwater, then place prosource and water mixture in tube and flush with 34ml 89mater after. What changed:   how much to take  how to take this  when to take this  additional instructions   feeding supplement (PRO-STAT SUGAR FREE 64) Liqd Place 30 mLs into feeding tube 3 (three) times daily with meals.   ferrous sulfate 325 (65 FE) MG tablet Take 325 mg by mouth daily with breakfast.   free water Soln Place 100 mLs into feeding tube See admin instructions. Please Flush PEG tube with 1 ounce (30 ml) before giving to feed, and then flush with 3 ounces (90ml) 31mr giving to feed--- up to 4 times a day   HYDROcodone-acetaminophen 10-325 MG tablet Commonly known as: NORCO Take 1 tablet by mouth every 4 (four) hours as needed. What changed: reasons to take this   lidocaine 2 % solution Commonly known as: XYLOCAINE Patient: Mix 1part 2% viscous lidocaine, 1part H20. Swish & swallow 10mL of26muted mixture, 30min be87m meals and at bedtime, up to QID What changed:   how much to take  how to take this  when to take this  additional instructions   nitroGLYCERIN 0.4 MG SL tablet Commonly known as: NITROSTAT  Place 1 tablet (0.4 mg total) under the tongue every 5 (five) minutes as needed for chest pain (CP or SOB).   omeprazole 20 MG capsule Commonly known as: PRILOSEC Take 20 mg by mouth daily.   potassium chloride 20 MEQ/15ML (10%) Soln Place 15 mLs (20 mEq total) into feeding tube daily for 5 days.   prochlorperazine 10 MG tablet Commonly known as: COMPAZINE TAKE 1 TABLET(10 MG) BY MOUTH EVERY 6 HOURS AS NEEDED FOR NAUSEA OR VOMITING What changed: See the new instructions.   Tracheostomy Care Kit 1 kit by Does not apply route daily.   traMADol 50 MG tablet Commonly known as: ULTRAM Take 50-100 mg by mouth 4 (four) times daily as needed (pain.).            Home Infusion Instuctions  (From admission, onward)          Start     Ordered   04/14/19 0000  Home infusion instructions    Question:  Instructions  Answer:  Flushing of vascular access device: 0.9% NaCl pre/post medication administration and prn patency; Heparin 100 u/ml, 66m for implanted ports and Heparin 10u/ml, 571mfor all other central venous catheters.   04/14/19 1501          Discharge Assessment: Vitals:   04/14/19 0534 04/14/19 0835  BP: 131/76   Pulse: 70   Resp: 18   Temp: 98.7 F (37.1 C)   SpO2: 94% 95%   Skin clean, dry and intact without evidence of skin break down, no evidence of skin tears noted. IV catheter discontinued intact. Site without signs and symptoms of complications - no redness or edema noted at insertion site, patient denies c/o pain - only slight tenderness at site.  Dressing with slight pressure applied.  D/c Instructions-Education: Discharge instructions given to patient/family with verbalized understanding. D/c education completed with patient/family including follow up instructions, medication list, d/c activities limitations if indicated, with other d/c instructions as indicated by MD - patient able to verbalize understanding, all questions fully answered. Patient instructed to return to ED, call 911, or call MD for any changes in condition.  [place] private auto.  BuBerton BonRN 04/14/2019 4:15 PM

## 2019-04-14 NOTE — TOC Transition Note (Signed)
Transition of Care [facility]) - CM/SW Discharge Note   Patient Details  Name: [name] MRN: [id number] Date of Birth: 02-23-1960  Transition of [facility]) CM/SW Contact:  Shade Flood, LCSW Phone Number: 04/14/2019, 2:08 PM   Clinical Narrative:     Pt stable for dc today per MD. Pt will require three more days of IV Rocephin upon dc and MD asking if pt can receive using a peripheral line rather than PICC. Spoke with pt about in network provider options and he is agreeable to referrals.   Referrals sent to Mickel Baas at [place] at [place]. Both are able to provide needed care for IV Rocephin through Peripheral line at home. IV medication will be delivered to pt's home tonight and [facility] RN will follow up with pt tomorrow at noon for next dose.  Updated MD and RN. There are no other TOC needs for dc.   Final next level of care: [place] Barriers to Discharge: Barriers Resolved   Patient Goals and CMS Choice        Discharge Placement                       Discharge Plan and Services In-house Referral: Clinical Social Work                        HH Arranged: RN, IV Antibiotics HH Agency: [place] ([facility]) Date [place]: 04/14/19 Time [place]: 1408 Representative spoke with at [place]: [place] ([place]) Interventions     Readmission Risk Interventions Readmission Risk Prevention Plan 04/10/2019  Transportation Screening Complete  Medication Review Press photographer) Complete  HRI or [place] Complete  [place] Not Applicable  Some recent data might be hidden

## 2019-04-14 NOTE — Discharge Summary (Signed)
[name], is a 59 y.o. male  DOB 1960/05/31  MRN [id number].  Admission date:  04/08/2019  Admitting Physician  Barton Dubois, MD  Discharge Date:  04/14/2019   Primary MD  [name], MD  Recommendations for primary care physician for things to follow:   Sepsis and bacteremia--needs IV Rocephin 2 g daily for 3 days, starting 04/15/2019 through 04/17/2019--okay to pull IV after completion of antibiotics-on 04/17/2019  1)Please Flush PEG tube with 1 ounce (30 ml) before giving to feed, and then flush with 3 ounces (61m) after giving to feed--- up to 4 times a day 2) registered nurse will come to your house to help you with IV infusion of antibiotic Rocephin/ceftriaxone 2 g once daily starting on Tuesday, 04/15/2019 through Thursday, 04/17/2019 inclusive 3)You will start Cipro antibiotic 500 mg twice a day for 1 week on Friday, 04/18/2019 through 04/25/2019 4) you need CBC and BMP blood work every Friday x2 weeks starting on Friday, 04/18/2019  Admission Diagnosis  Hyponatremia [E87.1] Febrile illness, acute [R50.9] Sepsis, due to unspecified organism, unspecified whether acute organ dysfunction present ([facility] [A41.9]   Discharge Diagnosis  Hyponatremia [E87.1] Febrile illness, acute [R50.9] Sepsis, due to unspecified organism, unspecified whether acute organ dysfunction present ([facility] [A41.9]    Principal Problem:   HCAP (healthcare-associated pneumonia) Active Problems:   Hypertension   Alcohol abuse, daily use   Hyperlipidemia   Hyponatremia   Stroke ([facility]   Malignant neoplasm of supraglottis ([facility]   Hx of tracheostomy   Head and neck cancer ([facility] [place]   Malnutrition of moderate degree   Febrile illness, acute      Past Medical History:  Diagnosis Date  . Anxiety   . Arthritis    back  . Bradycardia    a. During 11/2012 adm: lopressor decreased.  . Cancer of larynx ([facility]   . Carotid artery  stenosis   . Chronic lower back pain    "I work cArchitect messed back up ~ 30 yr ago; paralyzed for 2 days then" (11/05/2012)  . Coronary artery disease    a. Diag cath 10/2012 for CP/abnormal nuc -> planned PCI s/p LAD atherectomy/DES placement 11/05/12.  .Marland KitchenGERD (gastroesophageal reflux disease)   . History of hiatal hernia   . Hypercholesteremia   . Hypertension   . Stroke ([facility] 07/31/2018   bilateral vertebrobasilar occlusion; "left side weaker thanit was before."    Past Surgical History:  Procedure Laterality Date  . CARDIAC CATHETERIZATION  10/29/2012  . CORONARY ANGIOPLASTY WITH STENT PLACEMENT  11/05/2012  . DIRECT LARYNGOSCOPY N/A 01/02/2019   Procedure: MICRO DIRECT LARYNGOSCOPY BIOPSY OF LARYNGEAL MASS;  Surgeon: TLeta Baptist MD;  [place];  Service: ENT;  Laterality: N/A;  . HEMORRHOID SURGERY  ~ 2010  . INSERTION OF MESH N/A 06/02/2015   Procedure: INSERTION OF MESH;  Surgeon: MAviva Signs MD;  Location: [facility];  Service: General;  Laterality: N/A;  . IR ANGIO INTRA EXTRACRAN SEL COM CAROTID INNOMINATE BILAT MOD SED  08/02/2018  .  IR ANGIO VERTEBRAL SEL VERTEBRAL BILAT MOD SED  08/02/2018  . LAPAROSCOPIC INSERTION GASTROSTOMY TUBE N/A 02/24/2019   Procedure: LAPAROSCOPIC INSERTION GASTROSTOMY TUBE;  Surgeon: [name], MD;  Location: [place];  Service: General;  Laterality: N/A;  . LEFT HEART CATHETERIZATION WITH CORONARY ANGIOGRAM N/A 10/30/2012   Procedure: LEFT HEART CATHETERIZATION WITH CORONARY ANGIOGRAM;  Surgeon: [name], MD;  Location: [facility];  Service: Cardiovascular;  Laterality: N/A;  . MULTIPLE EXTRACTIONS WITH ALVEOLOPLASTY N/A 02/18/2019   Procedure: Extraction of tooth #'s 5-7, 10,11,14,20-29, 31 and 32 with alveoloplasty and maxiillary left buccal exostoses reductions;  Surgeon: [name], [name];  Location: [place];  Service: Oral Surgery;  Laterality: N/A;  . PERCUTANEOUS CORONARY ROTOBLATOR INTERVENTION (PCI-R) N/A 11/05/2012    Procedure: PERCUTANEOUS CORONARY ROTOBLATOR INTERVENTION (PCI-R);  Surgeon: [name], MD;  Location: [facility] CATH LAB;  Service: Cardiovascular;  Laterality: N/A;  . TRACHEOSTOMY TUBE PLACEMENT N/A 02/18/2019   Procedure: Tracheostomy;  Surgeon: [name], MD;  Location: [place];  Service: ENT;  Laterality: N/A;  . UMBILICAL HERNIA REPAIR N/A 06/02/2015   Procedure: UMBILICAL HERNIORRHAPHY WITH MESH;  Surgeon: [name], MD;  Location: [facility];  Service: General;  Laterality: N/A;     HPI  from the history and physical done on the day of admission:    Chief Complaint: Fever, shortness of breath and productive cough.  HPI: KYCE GING is a 59 y.o. male with past medical history significant for malignant neoplasm of supraglottis, status post tracheostomy, actively receiving chemotherapy and radiation; dysphagia status post PEG tube placement, history of coronary artery disease a status post PCI with stenting in 2014, history of nonhemorrhagic CVA, chronic lower back pain, essential hypertension, hyperlipidemia, recent admission secondary to acute blood loss anemia presumed to be secondary to upper GI bleed (04/01/19--04/05/19); who presented to the emergency department secondary to worsening shortness of breath, generalized weakness and productive cough.  Patient also expressed having fevers at home and not feeling well.  Denies chest pain, no nausea, no vomiting, no dysuria, no hematuria, no melena, no hematochezia, no focal deficits or any other complaints.  In the ED work-up demonstrated chest x-ray at with right lower lobe atelectasis, temperature of 101.6 and hyponatremia (slightly worse than his baseline) and thrombocytopenia.  Initially was a slightly tachypneic with subsequent improvement by time of my examination.  2 L of oxygen supplementation in place.  Cultures taken, antibiotics initiated and TRH contacted 20 patient for further evaluation and management.  There is concern for HCAP as reason  for his fevers.   Hospital Course:     Sepsis and bacteremia--needs IV Rocephin 2 g daily for 3 days, starting 04/15/2019 through 04/17/2019--okay to pull IV after completion of antibiotics-on 04/17/2019  1)Please Flush PEG tube with 1 ounce (30 ml) before giving to feed, and then flush with 3 ounces (42m) after giving to feed--- up to 4 times a day 2) registered nurse will come to your house to help you with IV infusion of antibiotic Rocephin/ceftriaxone 2 g once daily starting on Tuesday, 04/15/2019 through Thursday, 04/17/2019 inclusive 3)You will start Cipro antibiotic 500 mg twice a day for 1 week on Friday, 04/18/2019 through 04/25/2019 4) you need CBC and BMP blood work every Friday x2 weeks starting on Friday, 04/18/2019  Brief Summary:- 59y.o.malewith past medical history significant for malignant neoplasm of supraglottis, status post tracheostomy, actively receiving chemotherapy and radiation; dysphagia status post PEG tube placement, history of coronary artery disease  a status post PCI with stenting in 2014, history of nonhemorrhagic CVA, chronic lower back pain, essential hypertension, hyperlipidemia, recent admission secondary to acute blood loss anemiapresumed to be secondary toupper GI bleed(04/01/19--04/05/19);who presented to the emergency department secondary to worsening shortness of breath, generalized weakness and productive cough. Patient also expressed having fevers at home and not feeling well. Denies chest pain, no nausea, no vomiting, no dysuria, no hematuria, no melena, no hematochezia, no focal deficits or any other complaints.  In the ED work-up demonstrated chest x-ray at with right lower lobe atelectasis,temperature of 101.6 and hyponatremia (slightly worse than his baseline) and thrombocytopenia. Initially was a slightly tachypneic with subsequent improvement by time of my examination. 2 L of oxygen supplementation in place. Cultures taken, antibiotics initiated  and TRH contacted 20 patient for further evaluation and management. There is concern for HCAP as reason for his fevers.  A/p 1) bacteremia and sepsis secondary to HCAP-blood cultures from 04/08/19 with Klebsiella and Serratia marcescens-both organisms mostly pansensitive (No ESBL)  -Discussed with Dr. Lita Mains  from infectious disease -Antimicrobials this admission: cefepime4/6>>4/9 meropenem 4/9>>04/12/19 Rocephin 2gm iv 04/12/19 -Right arm PICC line pulled on 04/12/19 -Patient will receive a total of 14 days of antibiotics from negative culture date of 04/11/2019 with 7 days of IV through 04/17/2019 followed by 7 days of p.o. Levaquin as noted below -Plan is to treat with IV Rocephin f through 04/17/2019 inclusive followed by p.o. Cipro for 7 days to complete 14 days of antibiotic regimen -Blood cultures from 04/11/2019 NGTD -Patient is status post prior tracheostomy -Continue bronchodilators and humidifier to trach -Patient is immunocompromise due to ongoing chemo/radiation therapy -Patient has OPAT, iv Rocephin to be done through peripheral line  2) history of malignancy of the supraglottis/head and neck cancer --- PTA was undergoing chemo and radiation -Leukopenia improving noted in the setting of chemoradiation therapy  3)/FEN/Tube Feeds--- PTA he was on Osmolite 1.5cal 71f oz he takes 3-4x/day and is supposed to work up to 7x/day and prosource 15g protein -Dietitian consult with recommendations appreciated for tube feeds via PEG tube  4) history of prior stroke--- continue aspirin and Lipitor 5)Hyponatremia--largely asymptomatic, sodium is up to 137 after water flushes via PEG tube as adjusted -[facility] Disposition/Need for in-Hospital Stay- patient unable to be discharged at this time due to --pneumonia and immunocompromise patients requiring IV antibiotics   -Patient From: From home with wife D/C Place: Back home with wife  discharge home on 04/14/2018 with IV  antibiotics by home health agency  Code Status : Full code  Family Communication:     (patient is alert, awake and coherent) -Discussed with patient's wife at bedside  Consults  : Phone consult with ID physician Dr. JBobby Rumpf Discharge Condition: stable  Follow UP  [facility]   Specialty: [place]: [place] be at your home on 4/13 to assist with IV antibiotic administration. They will call you to confirm but it should be around noon.       [facility] Follow up.   Why: [facility] will be delivering the antibiotic medication to your home this evening.       FRedmond School MD. Schedule an appointment as soon as possible for a visit in 4 day(s).   Specialty: Internal Medicine Why: CBC and BMP  every Friday x2 weeks starting 04/18/2019 Contact information: [address]  [place]        Herminio Commons, MD .   Specialty: Cardiology Contact information: [place] [address] [phone]            Consults obtained -   Diet and Activity recommendation:  As advised  Discharge Instructions     Discharge Instructions    Call MD for:  difficulty breathing, headache or visual disturbances   Complete by: As directed    Call MD for:  persistant dizziness or light-headedness   Complete by: As directed    Call MD for:  persistant nausea and vomiting   Complete by: As directed    Call MD for:  severe uncontrolled pain   Complete by: As directed    Call MD for:  temperature >100.4   Complete by: As directed    Diet clear liquid   Complete by: As directed    Tube feeding as advised -Oral fluids as tolerated in small amounts   Discharge instructions   Complete by: As directed    1)Please Flush PEG tube with 1 ounce (30 ml) before giving to feed, and then flush with 3 ounces (41m) after giving to feed--- up  to 4 times a day 2) registered nurse will come to your house to help you with IV infusion of antibiotic Rocephin/ceftriaxone 2 g once daily starting on Tuesday, 04/15/2019 through Thursday, 04/17/2019 inclusive 3)You will start Cipro antibiotic 500 mg twice a day for 1 week on Friday, 04/18/2019 through 04/25/2019 4) you need CBC and BMP blood work every Friday x2 weeks starting on Friday, 04/18/2019   Home infusion instructions   Complete by: As directed    Instructions: Flushing of vascular access device: 0.9% NaCl pre/post medication administration and prn patency; Heparin 100 u/ml, 560mfor implanted ports and Heparin 10u/ml, 67m66mor all other central venous catheters.   Increase activity slowly   Complete by: As directed         Discharge Medications     Allergies as of 04/14/2019      Reactions   Duloxetine Other (See Comments)   Caused depression   Prednisone Other (See Comments)   Chest pain      Medication List    STOP taking these medications   CISPLATIN IV   Magnesium Oxide -Mg Supplement 400 MG Caps   sodium chloride 0.9 % infusion   sodium chloride irrigation 0.9 % irrigation     TAKE these medications   acetaminophen 325 MG tablet Commonly known as: TYLENOL Take 2 tablets (650 mg total) by mouth every 6 (six) hours as needed for mild pain, fever or headache (or Fever >/= 101).   [place] Suction Catheter 14FR Misc 1 Device by Does not apply route daily.   aspirin EC 81 MG tablet Take 81 mg by mouth daily.   atorvastatin 80 MG tablet Commonly known as: LIPITOR Take 1 tablet (80 mg total) by mouth every evening. What changed:   how much to take  when to take this   cefTRIAXone  IVPB Commonly known as: ROCEPHIN Inject 2 g into the vein daily for 3 days. Indication:  bacteremia Last Day of Therapy:  04/17/19 Labs - Once weekly:  CBC/D and BMP, Labs - Every other week:  ESR and CRP Start taking on: April 15, 2019   ciprofloxacin 500 MG tablet Commonly  known as: CIPRO Take 1 tablet (500 mg total) by mouth 2 (two) times daily for 7 days. Starting Friday  04/18/2019 after you finish IV antibiotics Start taking on: April 18, 2019   feeding supplement (OSMOLITE 1.5 CAL) Liqd Give 2 cartons of osmolite 1.5 at 9:30am, 1:30, 5:30 and 1 carton at 9:30pm.  Flush with 46m of water before and 1287mafter each feeding. Meets 100% of patient needs. Send bolus supplies. What changed:   how much to take  how to take this  when to take this   ProSource TF Liqd Give 3076m times per day via feeding tube.  Mix with 75m53m water.  Flush tube with 75ml16mwater, then place prosource and water mixture in tube and flush with 75ml 34mater after. What changed:   how much to take  how to take this  when to take this  additional instructions   feeding supplement (PRO-STAT SUGAR FREE 64) Liqd Place 30 mLs into feeding tube 3 (three) times daily with meals.   ferrous sulfate 325 (65 FE) MG tablet Take 325 mg by mouth daily with breakfast.   free water Soln Place 100 mLs into feeding tube See admin instructions. Please Flush PEG tube with 1 ounce (30 ml) before giving to feed, and then flush with 3 ounces (90ml) 34mr giving to feed--- up to 4 times a day   HYDROcodone-acetaminophen 10-325 MG tablet Commonly known as: NORCO Take 1 tablet by mouth every 4 (four) hours as needed. What changed: reasons to take this   lidocaine 2 % solution Commonly known as: XYLOCAINE Patient: Mix 1part 2% viscous lidocaine, 1part H20. Swish & swallow 10mL of29muted mixture, 30min be72m meals and at bedtime, up to QID What changed:   how much to take  how to take this  when to take this  additional instructions   nitroGLYCERIN 0.4 MG SL tablet Commonly known as: NITROSTAT Place 1 tablet (0.4 mg total) under the tongue every 5 (five) minutes as needed for chest pain (CP or SOB).   omeprazole 20 MG capsule Commonly known as: PRILOSEC Take 20 mg by mouth  daily.   potassium chloride 20 MEQ/15ML (10%) Soln Place 15 mLs (20 mEq total) into feeding tube daily for 5 days.   prochlorperazine 10 MG tablet Commonly known as: COMPAZINE TAKE 1 TABLET(10 MG) BY MOUTH EVERY 6 HOURS AS NEEDED FOR NAUSEA OR VOMITING What changed: See the new instructions.   Tracheostomy Care Kit 1 kit by Does not apply route daily.   traMADol 50 MG tablet Commonly known as: ULTRAM Take 50-100 mg by mouth 4 (four) times daily as needed (pain.).            Home Infusion Instuctions  (From admission, onward)         Start     Ordered   04/14/19 0000  Home infusion instructions    Question:  Instructions  Answer:  Flushing of vascular access device: 0.9% NaCl pre/post medication administration and prn patency; Heparin 100 u/ml, 5ml for i40manted ports and Heparin 10u/ml, 5ml for al19mther central venous catheters.   04/14/19 1501          Major procedures and Radiology Reports - PLEASE review detailed and final reports for all details, in brief -   DG Chest Port 1 View  Result Date: 04/08/2019 CLINICAL DATA:  Hypoxia. Fever. EXAM: PORTABLE CHEST 1 VIEW COMPARISON:  04/01/2019 FINDINGS: Tracheostomy tube in good position. PICC tip is in good position just below the carina in the superior vena cava. The heart size is normal. Slight pulmonary vascular prominence  without pulmonary edema. No effusions. No acute bone abnormality. IMPRESSION: Slight pulmonary vascular prominence. Electronically Signed   By: Lorriane Shire M.D.   On: 04/08/2019 13:35   DG Chest Port 1 View  Result Date: 04/01/2019 CLINICAL DATA:  Syncope EXAM: PORTABLE CHEST 1 VIEW COMPARISON:  03/17/2019 FINDINGS: Two frontal views of the chest demonstrates stable tracheostomy tube and right-sided PICC. Cardiac silhouette is unremarkable. No airspace disease, effusion, or pneumothorax. No acute bony abnormalities. IMPRESSION: 1. No acute intrathoracic process. Electronically Signed   By: Randa Ngo M.D.   On: 04/01/2019 15:04   DG Chest Portable 1 View  Result Date: 03/17/2019 CLINICAL DATA:  Fever. EXAM: PORTABLE CHEST 1 VIEW COMPARISON:  Radiograph 03/04/2019 FINDINGS: Tracheostomy tube tip at the thoracic inlet. Right upper extremity central line tip in the SVC. Streaky opacities at the right lung base are new from prior exam. Unchanged heart size and mediastinal contours. No pulmonary edema or pneumothorax. No large pleural effusion. No acute osseous abnormalities are seen. IMPRESSION: Streaky right lung base opacities, new from radiographs earlier this month, favoring atelectasis but could represent pneumonia in the setting of fever. Electronically Signed   By: Keith Rake M.D.   On: 03/17/2019 15:54   ECHOCARDIOGRAM COMPLETE  Result Date: 04/03/2019    ECHOCARDIOGRAM REPORT   Patient Name:   CALLAWAY HAILES Date of Exam: 04/03/2019 Medical Rec #:  [id number]     Height:       72.0 in Accession #:    [id number]    Weight:       167.5 lb Date of Birth:  Jul 12, 1960     BSA:          1.976 m Patient Age:    75 years      BP:           130/92 mmHg Patient Gender: M             HR:           74 bpm. Exam Location:  Inpatient Procedure: 2D Echo, Cardiac Doppler and Color Doppler Indications:    R55 Syncope  History:        Patient has prior history of Echocardiogram examinations, most                 recent 08/01/2018. CAD, Stroke; Risk Factors:Hypertension,                 Dyslipidemia and GERD. Cancer.  Sonographer:    Tiffany Dance Referring Phys: [id number] [place]  1. Normal LV systolic function; grade 1 diastolic dysfunction; mild LVH; moderately to severely dilated aortic root; suggest CTA or MRA to further assess; trace AI.  2. Left ventricular ejection fraction, by estimation, is 55 to 60%. The left ventricle has normal function. The left ventricle has no regional wall motion abnormalities. There is mild left ventricular hypertrophy. Left ventricular diastolic parameters  are consistent with Grade I diastolic dysfunction (impaired relaxation).  3. Right ventricular systolic function is normal. The right ventricular size is normal. There is mildly elevated pulmonary artery systolic pressure.  4. The mitral valve is normal in structure. Trivial mitral valve regurgitation. No evidence of mitral stenosis.  5. The aortic valve is tricuspid. Aortic valve regurgitation is trivial. No aortic stenosis is present.  6. Aortic dilatation noted. There is moderate to severe dilatation of the aortic root measuring 48 mm.  7. The inferior vena cava is dilated in size with >50% respiratory  variability, suggesting right atrial pressure of 8 mmHg. FINDINGS  Left Ventricle: Left ventricular ejection fraction, by estimation, is 55 to 60%. The left ventricle has normal function. The left ventricle has no regional wall motion abnormalities. The left ventricular internal cavity size was normal in size. There is  mild left ventricular hypertrophy. Left ventricular diastolic parameters are consistent with Grade I diastolic dysfunction (impaired relaxation). Right Ventricle: The right ventricular size is normal. Right ventricular systolic function is normal. There is mildly elevated pulmonary artery systolic pressure. The tricuspid regurgitant velocity is 2.52 m/s, and with an assumed right atrial pressure of 8 mmHg, the estimated right ventricular systolic pressure is 16.0 mmHg. Left Atrium: Left atrial size was normal in size. Right Atrium: Right atrial size was normal in size. Pericardium: There is no evidence of pericardial effusion. Mitral Valve: The mitral valve is normal in structure. Normal mobility of the mitral valve leaflets. Trivial mitral valve regurgitation. No evidence of mitral valve stenosis. Tricuspid Valve: The tricuspid valve is normal in structure. Tricuspid valve regurgitation is mild . No evidence of tricuspid stenosis. Aortic Valve: The aortic valve is tricuspid. Aortic valve  regurgitation is trivial. Aortic regurgitation PHT measures 400 msec. No aortic stenosis is present. Pulmonic Valve: The pulmonic valve was normal in structure. Pulmonic valve regurgitation is trivial. No evidence of pulmonic stenosis. Aorta: The aortic root is normal in size and structure and aortic dilatation noted. There is moderate to severe dilatation of the aortic root measuring 48 mm. Venous: The inferior vena cava is dilated in size with greater than 50% respiratory variability, suggesting right atrial pressure of 8 mmHg. IAS/Shunts: No atrial level shunt detected by color flow Doppler. Additional Comments: Normal LV systolic function; grade 1 diastolic dysfunction; mild LVH; moderately to severely dilated aortic root; suggest CTA or MRA to further assess; trace AI.  LEFT VENTRICLE PLAX 2D LVIDd:         4.10 cm  Diastology LVIDs:         2.60 cm  LV e' lateral:   7.83 cm/s LV PW:         1.50 cm  LV E/e' lateral: 7.1 LV IVS:        1.30 cm  LV e' medial:    6.74 cm/s LVOT diam:     2.30 cm  LV E/e' medial:  8.2 LV SV:         72 LV SV Index:   36 LVOT Area:     4.15 cm  RIGHT VENTRICLE             IVC RV Basal diam:  2.80 cm     IVC diam: 2.40 cm RV S prime:     10.00 cm/s TAPSE (M-mode): 1.6 cm LEFT ATRIUM             Index       RIGHT ATRIUM           Index LA diam:        4.30 cm 2.18 cm/m  RA Area:     15.30 cm LA Vol (A2C):   50.6 ml 25.61 ml/m RA Volume:   36.70 ml  18.57 ml/m LA Vol (A4C):   36.2 ml 18.32 ml/m LA Biplane Vol: 44.3 ml 22.42 ml/m  AORTIC VALVE LVOT Vmax:   103.00 cm/s LVOT Vmean:  57.200 cm/s LVOT VTI:    0.173 m AI PHT:      400 msec  AORTA Ao Root diam: 4.80 cm Ao  Asc diam:  3.40 cm MITRAL VALVE               TRICUSPID VALVE MV Area (PHT): 2.91 cm    TR Peak grad:   25.4 mmHg MV Decel Time: 261 msec    TR Vmax:        252.00 cm/s MV E velocity: 55.50 cm/s MV A velocity: 74.40 cm/s  SHUNTS MV E/A ratio:  0.75        Systemic VTI:  0.17 m                            Systemic  Diam: 2.30 cm Kirk Ruths MD Electronically signed by Kirk Ruths MD Signature Date/Time: 04/03/2019/3:11:19 PM    Final     Micro Results    Recent Results (from the past 240 hour(s))  Urine culture     Status: None   Collection Time: 04/08/19 12:58 PM   Specimen: In/Out Cath Urine  Result Value Ref Range Status   Specimen Description   Final    IN/OUT CATH URINE Performed at [facility], [address] [place], [place] [address]    Special Requests   Final    NONE Performed at [facility], [address] [place], [place] [address]    Culture   Final    NO GROWTH Performed at [facility], [place] [address] [place], [place] [address]    Report Status 04/09/2019 FINAL  Final  Blood Culture (routine x 2)     Status: Abnormal   Collection Time: 04/08/19  1:09 PM   Specimen: BLOOD  Result Value Ref Range Status   Specimen Description   Final    BLOOD LEFT ANTECUBITAL Performed at [facility], [address], [place], [place] [address]    Special Requests   Final    Blood Culture adequate volume Performed at [facility], [address] [place], [place] [address]    Culture  Setup Time   Final    IN BOTH AEROBIC AND ANAEROBIC BOTTLES GRAM NEGATIVE RODS Gram Stain Report Called to,Read Back By and Verified With: A [facility] '@0128'$  04/09/19 [facility] Performed at [facility], [address] [place], [place] [address]    Culture SERRATIA MARCESCENS (A)  Final   Report Status 04/13/2019 FINAL  Final   Organism ID, Bacteria SERRATIA MARCESCENS  Final      Susceptibility   Serratia marcescens - MIC*    CEFAZOLIN >=64 RESISTANT Resistant     CEFEPIME <=0.12 SENSITIVE Sensitive     CEFTAZIDIME <=1 SENSITIVE Sensitive     CEFTRIAXONE <=0.25 SENSITIVE Sensitive     CIPROFLOXACIN <=0.25 SENSITIVE Sensitive     GENTAMICIN <=1 SENSITIVE Sensitive     TRIMETH/SULFA <=20 SENSITIVE Sensitive     * SERRATIA MARCESCENS  Blood Culture (routine x 2)     Status: Abnormal    Collection Time: 04/08/19  1:09 PM   Specimen: BLOOD  Result Value Ref Range Status   Specimen Description   Final    BLOOD PICC LINE DRAWN BY RN Performed at [facility], [address] [place], [place] [address]    Special Requests   Final    Blood Culture results may not be optimal due to an inadequate volume of blood received in culture bottles Performed at [facility], [address], [place], [place] [address]    Culture  Setup Time   Final    IN  BOTH AEROBIC AND ANAEROBIC BOTTLES GRAM NEGATIVE RODS Gram Stain Report Called to,Read Back By and Verified With: A MUHAMMAD,RN '@0130'$  04/09/19 [facility] CRITICAL RESULT CALLED TO, READ BACK BY AND VERIFIED WITH: PHATMD J MINDENHALL [id number] AT 724 AM [facility] CM    Culture (A)  Final    KLEBSIELLA PNEUMONIAE SERRATIA MARCESCENS SUSCEPTIBILITIES PERFORMED ON PREVIOUS CULTURE WITHIN THE LAST 5 DAYS. Performed at [facility], [place] [address] [place], [place] [address]    Report Status 04/12/2019 FINAL  Final   Organism ID, Bacteria KLEBSIELLA PNEUMONIAE  Final      Susceptibility   Klebsiella pneumoniae - MIC*    AMPICILLIN RESISTANT Resistant     CEFAZOLIN <=4 SENSITIVE Sensitive     CEFEPIME <=0.12 SENSITIVE Sensitive     CEFTAZIDIME <=1 SENSITIVE Sensitive     CEFTRIAXONE <=0.25 SENSITIVE Sensitive     CIPROFLOXACIN <=0.25 SENSITIVE Sensitive     GENTAMICIN <=1 SENSITIVE Sensitive     IMIPENEM <=0.25 SENSITIVE Sensitive     TRIMETH/SULFA <=20 SENSITIVE Sensitive     AMPICILLIN/SULBACTAM 4 SENSITIVE Sensitive     PIP/TAZO <=4 SENSITIVE Sensitive     * KLEBSIELLA PNEUMONIAE  Blood Culture ID Panel (Reflexed)     Status: Abnormal   Collection Time: 04/08/19  1:09 PM  Result Value Ref Range Status   Enterococcus species NOT DETECTED NOT DETECTED Final   Listeria monocytogenes NOT DETECTED NOT DETECTED Final   Staphylococcus species NOT DETECTED NOT DETECTED Final   Staphylococcus aureus (BCID) NOT DETECTED NOT DETECTED Final    Streptococcus species NOT DETECTED NOT DETECTED Final   Streptococcus agalactiae NOT DETECTED NOT DETECTED Final   Streptococcus pneumoniae NOT DETECTED NOT DETECTED Final   Streptococcus pyogenes NOT DETECTED NOT DETECTED Final   Acinetobacter baumannii NOT DETECTED NOT DETECTED Final   Enterobacteriaceae species DETECTED (A) NOT DETECTED Final    Comment: CRITICAL RESULT CALLED TO, READ BACK BY AND VERIFIED WITH: PHARMD J MINDENHALL [id number] AT 7241 AM BY CM    Enterobacter cloacae complex NOT DETECTED NOT DETECTED Final   Escherichia coli NOT DETECTED NOT DETECTED Final   Klebsiella oxytoca NOT DETECTED NOT DETECTED Final   Klebsiella pneumoniae DETECTED (A) NOT DETECTED Final    Comment: CRITICAL RESULT CALLED TO, READ BACK BY AND VERIFIED WITH: PHARMD J MINDENHALL [id number] AT 0724 BY CM    Proteus species NOT DETECTED NOT DETECTED Final   Serratia marcescens DETECTED (A) NOT DETECTED Final    Comment: CRITICAL RESULT CALLED TO, READ BACK BY AND VERIFIED WITH: PHARMD J MINDENHALL [id number] AT 724 AM BY CM    Carbapenem resistance NOT DETECTED NOT DETECTED Final   Haemophilus influenzae NOT DETECTED NOT DETECTED Final   Neisseria meningitidis NOT DETECTED NOT DETECTED Final   Pseudomonas aeruginosa NOT DETECTED NOT DETECTED Final   Candida albicans NOT DETECTED NOT DETECTED Final   Candida glabrata NOT DETECTED NOT DETECTED Final   Candida krusei NOT DETECTED NOT DETECTED Final   Candida parapsilosis NOT DETECTED NOT DETECTED Final   Candida tropicalis NOT DETECTED NOT DETECTED Final    Comment: Performed at [facility], [place]. [address] [place], [place] [address]  Respiratory Panel by RT PCR (Flu A&B, Covid) - Nasopharyngeal Swab     Status: None   Collection Time: 04/08/19  6:14 PM   Specimen: Nasopharyngeal Swab  Result Value Ref Range Status   SARS Coronavirus 2 by RT PCR NEGATIVE NEGATIVE Final    Comment: (NOTE) SARS-CoV-2 target nucleic  acids are NOT DETECTED. The  SARS-CoV-2 RNA is generally detectable in upper respiratoy specimens during the acute phase of infection. The lowest concentration of SARS-CoV-2 viral copies this assay can detect is 131 copies/mL. A negative result does not preclude SARS-Cov-2 infection and should not be used as the sole basis for treatment or other patient management decisions. A negative result may occur with  improper specimen collection/handling, submission of specimen other than nasopharyngeal swab, presence of viral mutation(s) within the areas targeted by this assay, and inadequate number of viral copies (<131 copies/mL). A negative result must be combined with clinical observations, patient history, and epidemiological information. The expected result is Negative. Fact Sheet for Patients:  PinkCheek.be Fact Sheet for Healthcare Providers:  GravelBags.it This test is not yet ap proved or cleared by the [place] FDA and  has been authorized for detection and/or diagnosis of SARS-CoV-2 by FDA under an Emergency Use Authorization (EUA). This EUA will remain  in effect (meaning this test can be used) for the duration of the COVID-19 declaration under Section 564(b)(1) of the Act, 21 U.S.C. section 360bbb-3(b)(1), unless the authorization is terminated or revoked sooner.    Influenza A by PCR NEGATIVE NEGATIVE Final   Influenza B by PCR NEGATIVE NEGATIVE Final    Comment: (NOTE) The Xpert Xpress SARS-CoV-2/FLU/RSV assay is intended as an aid in  the diagnosis of influenza from Nasopharyngeal swab specimens and  should not be used as a sole basis for treatment. Nasal washings and  aspirates are unacceptable for Xpert Xpress SARS-CoV-2/FLU/RSV  testing. Fact Sheet for Patients: PinkCheek.be Fact Sheet for Healthcare Providers: GravelBags.it This test is not yet approved or cleared by the [place] FDA and  has been authorized for detection and/or diagnosis of SARS-CoV-2 by  FDA under an Emergency Use Authorization (EUA). This EUA will remain  in effect (meaning this test can be used) for the duration of the  Covid-19 declaration under Section 564(b)(1) of the Act, 21  U.S.C. section 360bbb-3(b)(1), unless the authorization is  terminated or revoked. Performed at [facility], [place] [address], [place], [place] [address]   Culture, blood (Routine X 2) w Reflex to ID Panel     Status: None (Preliminary result)   Collection Time: 04/11/19  8:58 AM   Specimen: Left Antecubital; Blood  Result Value Ref Range Status   Specimen Description   Final    LEFT ANTECUBITAL BOTTLES DRAWN AEROBIC AND ANAEROBIC   Special Requests Blood Culture adequate volume  Final   Culture   Final    NO GROWTH 3 DAYS Performed at [facility], [address], [place], [place] [address]    Report Status PENDING  Incomplete  Culture, blood (Routine X 2) w Reflex to ID Panel     Status: None (Preliminary result)   Collection Time: 04/11/19  8:58 AM   Specimen: BLOOD LEFT FOREARM  Result Value Ref Range Status   Specimen Description   Final    BLOOD LEFT FOREARM BOTTLES DRAWN AEROBIC AND ANAEROBIC   Special Requests Blood Culture adequate volume  Final   Culture   Final    NO GROWTH 3 DAYS Performed at [facility], [address], [place], [place] [address]    Report Status PENDING  Incomplete   Today   [place] today has no  New complaints, - no further fevers or chills, tolerating PEG tube edema        Patient has been  seen and examined prior to discharge   Objective   Blood pressure 131/76, pulse 70, temperature 98.7 F (37.1 C), resp. rate 18, height 6' (1.829 m), weight 75.9 kg, SpO2 95 %.   Intake/Output Summary (Last 24 hours) at 04/14/2019 1503 Last data filed at 04/14/2019 0351 Gross per 24 hour  Intake --  Output 200 ml  Net -200 ml    Exam Gen:-  Awake Alert,in no apparent distress HEENT:- [place].AT, No sclera icterus Neck-tracheostomy  lungs-improved air movement, no wheezing  CV- S1, S2 normal, regular  Abd-  +ve B.Sounds, Abd Soft, No tenderness, PEG tube site is clean dry and intact Extremity/Skin:- No  edema, pedal pulses present  Psych-affect is appropriate, oriented x3 Neuro-generalized weakness no new focal deficits, no tremors MSK-right arm PICC line removed on 04/12/2019   Data Review   CBC w Diff:  Lab Results  Component Value Date   WBC 4.2 04/14/2019   HGB 8.6 (L) 04/14/2019   HCT 26.9 (L) 04/14/2019   HCT 25.0 (L) 04/02/2019   PLT 213 04/14/2019   LYMPHOPCT 2 04/08/2019   MONOPCT 4 04/08/2019   EOSPCT 0 04/08/2019   BASOPCT 0 04/08/2019    CMP:  Lab Results  Component Value Date   NA 134 (L) 04/14/2019   K 3.7 04/14/2019   CL 99 04/14/2019   CO2 27 04/14/2019   BUN 12 04/14/2019   BUN 13 12/24/2018   CREATININE 0.63 04/14/2019   CREATININE 1.23 10/28/2012   PROT 6.1 (L) 04/08/2019   ALBUMIN 2.6 (L) 04/08/2019   BILITOT 0.7 04/08/2019   ALKPHOS 47 04/08/2019   AST 20 04/08/2019   ALT 16 04/08/2019  . Total Discharge time is about 33 minutes  Roxan Hockey M.D on 04/14/2019 at 3:03 PM  Go to [email or web address] -  for contact info  [facility] - Office  [phone]

## 2019-04-14 NOTE — Discharge Instructions (Signed)
Sepsis and bacteremia--needs IV Rocephin 2 g daily for 3 days, starting 04/15/2019 through 04/17/2019--okay to pull IV after completion of antibiotics-on 04/17/2019  1)Please Flush PEG tube with 1 ounce (30 ml) before giving to feed, and then flush with 3 ounces (40ml) after giving to feed--- up to 4 times a day 2) registered nurse will come to your house to help you with IV infusion of antibiotic Rocephin/ceftriaxone 2 g once daily starting on Tuesday, 04/15/2019 through Thursday, 04/17/2019 inclusive 3)You will start Cipro antibiotic 500 mg twice a day for 1 week on Friday, 04/18/2019 through 04/25/2019 4) you need CBC and BMP blood work every Friday x2 weeks starting on Friday, 04/18/2019

## 2019-04-14 NOTE — Progress Notes (Signed)
Pharmacist Chemotherapy Monitoring - Follow Up Assessment    I verify that I have reviewed each item in the below checklist:  . Regimen for the patient is scheduled for the appropriate day and plan matches scheduled date. Marland Kitchen Appropriate non-routine labs are ordered dependent on drug ordered. . If applicable, additional medications reviewed and ordered per protocol based on lifetime cumulative doses and/or treatment regimen.   Plan for follow-up and/or issues identified: Yes . I-vent associated with next due treatment: Yes . MD and/or nursing notified: No  [name] [facility] 04/14/2019 11:47 AM

## 2019-04-15 ENCOUNTER — Ambulatory Visit: Payer: 59

## 2019-04-16 ENCOUNTER — Ambulatory Visit: Payer: 59

## 2019-04-16 ENCOUNTER — Inpatient Hospital Stay: Payer: 59

## 2019-04-16 LAB — CULTURE, BLOOD (ROUTINE X 2)
Culture: NO GROWTH
Culture: NO GROWTH
Special Requests: ADEQUATE
Special Requests: ADEQUATE

## 2019-04-16 NOTE — Progress Notes (Signed)
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    Patient's radiation treatment was cancelled for today. Patient did not come to nutrition appointment following radiation.   Called patient this afternoon for nutrition follow-up.  Noted hospital admissions.  Patient reports that he has taken 3 cartons of osmolite 1.5 today and will try to take 2 cartons later this afternoon for total of 5.  Reports yesterday took 4 cartons.  Reports that he is only giving 62ml water before and 76ml after each feeding due to feeling bloated.  Reports he is giving 1 prostat packet per day via feeding tube.   Reports loose stool but currently on antibiotics.    Reports that he is able to eat pudding and jello.  "Nothing else taste good."  Has been drinking 1 boost per day.  Also drink 2 soda and 20 oz bottle of water.    Medications: reviewed  Labs: reviewed  Anthropometrics:   Weight 167 lb 5.3 oz on 4/6 (hospital weight??)  Estimated energy needs: Calories: 2400-2800 Protein: 120-140 g Fluid: > 2 L  NUTRITION DIAGNOSIS: Predicted suboptimal energy intake continues with treatment.     INTERVENTION:  Patient to give 5 cartons of osmolite 1.5 via feeding tube per day.  (1 1/2 cartons at 9:30, 1 1/2 cartons at 12:30, 1 carton at 3:30 and 6:30pm).  Water flush of 31ml before and 37ml after each feeding.   Prostat 58ml mixed with water and flush of 59ml before and 65ml after 1 time per day via feeding tube. Patient will drink 2 boost plus orally for additional calories and protein.   Must drink or give via feeding tube additional 20 oz of water.   Regimen will provide 2695, 132 g protein and 2093ml free water Patient and wife have contact information    MONITORING, EVALUATION, GOAL: Predicted subopitmal energy intake continues relying on feeding tube   NEXT VISIT: Wednesday, April 21 after radiation  Vamsi Apfel B. [name], [place], [place] Registered  Dietitian [phone] (pager)

## 2019-04-17 ENCOUNTER — Ambulatory Visit
Admission: RE | Admit: 2019-04-17 | Discharge: 2019-04-17 | Disposition: A | Discharge status: Home / Self Care | Payer: 59 | Source: Ambulatory Visit | Attending: Radiation Oncology | Admitting: Radiation Oncology

## 2019-04-17 ENCOUNTER — Other Ambulatory Visit (HOSPITAL_COMMUNITY): Payer: 59

## 2019-04-17 ENCOUNTER — Ambulatory Visit (HOSPITAL_COMMUNITY): Payer: 59

## 2019-04-17 ENCOUNTER — Ambulatory Visit (HOSPITAL_COMMUNITY): Payer: 59 | Admitting: Hematology

## 2019-04-17 ENCOUNTER — Other Ambulatory Visit: Payer: Self-pay

## 2019-04-17 DIAGNOSIS — Z5111 Encounter for antineoplastic chemotherapy: Secondary | ICD-10-CM | POA: Diagnosis not present

## 2019-04-18 ENCOUNTER — Ambulatory Visit: Payer: 59

## 2019-04-18 ENCOUNTER — Ambulatory Visit
Admission: RE | Admit: 2019-04-18 | Discharge: 2019-04-18 | Disposition: A | Discharge status: Home / Self Care | Payer: 59 | Source: Ambulatory Visit | Attending: Radiation Oncology | Admitting: Radiation Oncology

## 2019-04-18 ENCOUNTER — Encounter (HOSPITAL_COMMUNITY): Payer: Self-pay | Admitting: *Deleted

## 2019-04-18 DIAGNOSIS — Z5111 Encounter for antineoplastic chemotherapy: Secondary | ICD-10-CM | POA: Diagnosis not present

## 2019-04-18 NOTE — Progress Notes (Signed)
We received report from the therapist at [facility] radiation that patient received treatment 20/35 yesterday and is doing well.  He had his last dose of IV antibiotics through his peripheral IV.  He was a little hypotensive.  He is receiving all nutrition through his PEG and can swallow some liquids okay.  He was told by someone while inpatient that he can not receive chemotherapy while on antibiotics, so that is the reason he missed his appointments yesterday.    Our schedulers have contacted patient today to get him in for office visit and labs early next week and then if Dr. Delton Coombes wants to proceed with treatment, we can do as previously scheduled on Thursday.

## 2019-04-21 ENCOUNTER — Ambulatory Visit: Payer: 59

## 2019-04-21 ENCOUNTER — Ambulatory Visit
Admission: RE | Admit: 2019-04-21 | Discharge: 2019-04-21 | Disposition: A | Discharge status: Home / Self Care | Payer: 59 | Source: Ambulatory Visit | Attending: Radiation Oncology | Admitting: Radiation Oncology

## 2019-04-21 ENCOUNTER — Other Ambulatory Visit: Payer: Self-pay

## 2019-04-21 DIAGNOSIS — Z5111 Encounter for antineoplastic chemotherapy: Secondary | ICD-10-CM | POA: Diagnosis not present

## 2019-04-21 NOTE — Progress Notes (Signed)

## 2019-04-22 ENCOUNTER — Ambulatory Visit: Payer: 59

## 2019-04-22 ENCOUNTER — Other Ambulatory Visit: Payer: Self-pay

## 2019-04-22 ENCOUNTER — Inpatient Hospital Stay (HOSPITAL_COMMUNITY): Payer: 59 | Attending: Hematology | Admitting: Hematology

## 2019-04-22 ENCOUNTER — Other Ambulatory Visit (HOSPITAL_COMMUNITY): Payer: Self-pay | Admitting: *Deleted

## 2019-04-22 ENCOUNTER — Inpatient Hospital Stay (HOSPITAL_COMMUNITY): Payer: 59

## 2019-04-22 ENCOUNTER — Ambulatory Visit
Admission: RE | Admit: 2019-04-22 | Discharge: 2019-04-22 | Disposition: A | Discharge status: Home / Self Care | Payer: 59 | Source: Ambulatory Visit | Attending: Radiation Oncology | Admitting: Radiation Oncology

## 2019-04-22 ENCOUNTER — Encounter (HOSPITAL_COMMUNITY): Payer: Self-pay | Admitting: Hematology

## 2019-04-22 DIAGNOSIS — Z5111 Encounter for antineoplastic chemotherapy: Secondary | ICD-10-CM | POA: Insufficient documentation

## 2019-04-22 DIAGNOSIS — Z8673 Personal history of transient ischemic attack (TIA), and cerebral infarction without residual deficits: Secondary | ICD-10-CM | POA: Insufficient documentation

## 2019-04-22 DIAGNOSIS — Z7982 Long term (current) use of aspirin: Secondary | ICD-10-CM | POA: Insufficient documentation

## 2019-04-22 DIAGNOSIS — C321 Malignant neoplasm of supraglottis: Secondary | ICD-10-CM

## 2019-04-22 DIAGNOSIS — Z930 Tracheostomy status: Secondary | ICD-10-CM | POA: Insufficient documentation

## 2019-04-22 LAB — COMPREHENSIVE METABOLIC PANEL
ALT: 18 U/L (ref 0–44)
AST: 20 U/L (ref 15–41)
Albumin: 3.1 g/dL — ABNORMAL LOW (ref 3.5–5.0)
Alkaline Phosphatase: 57 U/L (ref 38–126)
Anion gap: 11 (ref 5–15)
BUN: 10 mg/dL (ref 6–20)
CO2: 27 mmol/L (ref 22–32)
Calcium: 9.2 mg/dL (ref 8.9–10.3)
Chloride: 96 mmol/L — ABNORMAL LOW (ref 98–111)
Creatinine, Ser: 0.84 mg/dL (ref 0.61–1.24)
GFR calc Af Amer: >60 mL/min (ref >60)
GFR calc non Af Amer: >60 mL/min (ref >60)
Glucose, Bld: 142 mg/dL — ABNORMAL HIGH (ref 70–99)
Potassium: 4.1 mmol/L (ref 3.5–5.1)
Sodium: 134 mmol/L — ABNORMAL LOW (ref 135–145)
Total Bilirubin: 0.4 mg/dL (ref 0.3–1.2)
Total Protein: 7.4 g/dL (ref 6.5–8.1)

## 2019-04-22 LAB — CBC WITH DIFFERENTIAL/PLATELET
Abs Immature Granulocytes: 0.09 10*3/uL — ABNORMAL HIGH (ref 0.00–0.07)
Basophils Absolute: 0 10*3/uL (ref 0.0–0.1)
Basophils Relative: 1 %
Eosinophils Absolute: 0.2 10*3/uL (ref 0.0–0.5)
Eosinophils Relative: 3 %
HCT: 32 % — ABNORMAL LOW (ref 39.0–52.0)
Hemoglobin: 10 g/dL — ABNORMAL LOW (ref 13.0–17.0)
Immature Granulocytes: 1 %
Lymphocytes Relative: 14 %
Lymphs Abs: 0.9 10*3/uL (ref 0.7–4.0)
MCH: 30.2 pg (ref 26.0–34.0)
MCHC: 31.3 g/dL (ref 30.0–36.0)
MCV: 96.7 fL (ref 80.0–100.0)
Monocytes Absolute: 0.8 10*3/uL (ref 0.1–1.0)
Monocytes Relative: 13 %
Neutro Abs: 4.3 10*3/uL (ref 1.7–7.7)
Neutrophils Relative %: 68 %
Platelets: 415 10*3/uL — ABNORMAL HIGH (ref 150–400)
RBC: 3.31 MIL/uL — ABNORMAL LOW (ref 4.22–5.81)
RDW: 15.4 % (ref 11.5–15.5)
WBC: 6.3 10*3/uL (ref 4.0–10.5)
nRBC: 0 % (ref 0.0–0.2)

## 2019-04-22 NOTE — Progress Notes (Signed)
Patient is to have PICC line replaced here on 4/28 at 9 am.  Orders have been placed and IV team aware.

## 2019-04-22 NOTE — Progress Notes (Signed)
[place], [place] [address]   CLINIC:  Medical Oncology/Hematology  PCP:  [name], [place] [address] [phone]   REASON FOR VISIT:  Follow-up for supraglottic squamous cell carcinoma.  CURRENT THERAPY: Weekly cisplatin.  BRIEF ONCOLOGIC HISTORY:  Oncology History  Malignant neoplasm of supraglottis ([facility])  01/22/2019 Initial Diagnosis   Malignant neoplasm of supraglottis ([facility])   01/30/2019 Cancer Staging   Staging form: Larynx - Supraglottis, AJCC 8th Edition - Clinical: Stage IVA (cT2, cN2c, cM0) - Signed by [name], MD on 01/30/2019   03/07/2019 -  Chemotherapy   The patient had palonosetron (ALOXI) injection 0.25 mg, 0.25 mg, Intravenous,  Once, 3 of 7 cycles Administration: 0.25 mg (03/13/2019), 0.25 mg (03/07/2019), 0.25 mg (03/27/2019) CISplatin (PLATINOL) 79 mg in sodium chloride 0.9 % 250 mL chemo infusion, 40 mg/m2 = 79 mg, Intravenous,  Once, 3 of 7 cycles Administration: 79 mg (03/13/2019), 79 mg (03/07/2019), 79 mg (03/27/2019) fosaprepitant (EMEND) 150 mg in sodium chloride 0.9 % 145 mL IVPB, 150 mg, Intravenous,  Once, 3 of 7 cycles Administration: 150 mg (03/13/2019), 150 mg (03/07/2019), 150 mg (03/27/2019)  for chemotherapy treatment.       CANCER STAGING: Cancer Staging Malignant neoplasm of supraglottis ([facility]) Staging form: Larynx - Supraglottis, AJCC 8th Edition - Clinical: Stage IVA (cT2, cN2c, cM0) - Signed by [name], MD on 01/30/2019    INTERVAL HISTORY:  [name] 59 y.o. male seen for follow-up of supraglottic squamous cell carcinoma.  He had a couple of hospitalizations since last treatment.  PICC line was discontinued and he is taking oral Cipro.  He reports some diarrhea.  Appetite is 100%.  Energy levels are 50%.  He will finish Cipro on Friday.  He reported that his radiation started back on 04/17/2019.    REVIEW OF SYSTEMS:  Review of Systems   Gastrointestinal: Positive for diarrhea.  All other systems reviewed and are negative.    PAST MEDICAL/SURGICAL HISTORY:  Past Medical History:  Diagnosis Date  . Anxiety   . Arthritis    back  . Bradycardia    a. During 11/2012 adm: lopressor decreased.  . Cancer of larynx ([facility])   . Carotid artery stenosis   . 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 Coronary artery disease    a. Diag cath 10/2012 for CP/abnormal nuc -> planned PCI s/p LAD atherectomy/DES placement 11/05/12.  Marland Kitchen GERD (gastroesophageal reflux disease)   . History of hiatal hernia   . Hypercholesteremia   . Hypertension   . Stroke ([facility]) 07/31/2018   bilateral vertebrobasilar occlusion; "left side weaker thanit was before."   Past Surgical History:  Procedure Laterality Date  . CARDIAC CATHETERIZATION  10/29/2012  . CORONARY ANGIOPLASTY WITH STENT PLACEMENT  11/05/2012  . DIRECT LARYNGOSCOPY N/A 01/02/2019   Procedure: MICRO DIRECT LARYNGOSCOPY BIOPSY OF LARYNGEAL MASS;  Surgeon: [name], MD;  Location: [facility];  Service: ENT;  Laterality: N/A;  . HEMORRHOID SURGERY  ~ 2010  . INSERTION OF MESH N/A 06/02/2015   Procedure: INSERTION OF MESH;  Surgeon: [name], MD;  Location: [facility];  Service: General;  Laterality: N/A;  . IR ANGIO INTRA EXTRACRAN SEL COM CAROTID INNOMINATE BILAT MOD SED  08/02/2018  . IR ANGIO VERTEBRAL SEL VERTEBRAL BILAT MOD SED  08/02/2018  . LAPAROSCOPIC INSERTION GASTROSTOMY TUBE N/A 02/24/2019  Procedure: LAPAROSCOPIC INSERTION GASTROSTOMY TUBE;  Surgeon: [name], MD;  Location: [facility];  Service: General;  Laterality: N/A;  . LEFT HEART CATHETERIZATION WITH CORONARY ANGIOGRAM N/A 10/30/2012   Procedure: LEFT HEART CATHETERIZATION WITH CORONARY ANGIOGRAM;  Surgeon: [name], MD;  Location: [facility];  Service: Cardiovascular;  Laterality: N/A;  . MULTIPLE EXTRACTIONS WITH ALVEOLOPLASTY N/A 02/18/2019    Procedure: Extraction of tooth #'s 5-7, 10,11,14,20-29, 31 and 32 with alveoloplasty and maxiillary left buccal exostoses reductions;  Surgeon: [name], [name];  Location: [facility];  Service: Oral Surgery;  Laterality: N/A;  . PERCUTANEOUS CORONARY ROTOBLATOR INTERVENTION (PCI-R) N/A 11/05/2012   Procedure: PERCUTANEOUS CORONARY ROTOBLATOR INTERVENTION (PCI-R);  Surgeon: [name], MD;  Location: [facility];  Service: Cardiovascular;  Laterality: N/A;  . TRACHEOSTOMY TUBE PLACEMENT N/A 02/18/2019   Procedure: Tracheostomy;  Surgeon: [name], MD;  Location: [facility];  Service: ENT;  Laterality: N/A;  . UMBILICAL HERNIA REPAIR N/A 06/02/2015   Procedure: UMBILICAL HERNIORRHAPHY WITH MESH;  Surgeon: [name], MD;  Location[facility];  Service: General;  Laterality: N/A;     SOCIAL HISTORY:  Social History   Socioeconomic History  . Marital status: Married    Spouse name: Not on file  . Number of children: 4  . Years of education: Not on file  . Highest education level: Not on file  Occupational History  . Occupation: Disabled  Tobacco Use  . Smoking status: Former Smoker    Packs/day: 3.00    Years: 32.00    Pack years: 96.00    Types: Cigarettes    Start date: 08/29/1977    Quit date: 11/29/2006    Years since quitting: 12.4  . Smokeless tobacco: Former Systems developer    Types: Chew  . Tobacco comment: 11/05/2012 "chewed tobacco when I play ball; aien't chewed since age 5"  Substance and Sexual Activity  . Alcohol use: Yes    Alcohol/week: 12.0 standard drinks    Types: 12 Cans of beer per week    Comment: None since 02/2019   . Drug use: Yes    Types: Marijuana    Comment: denies on 04/08/19  . Sexual activity: Not Currently  Other Topics Concern  . Not on file  Social History Narrative   Patient is disabled.  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   Patient quit smoking 10 to 12 years ago.  Patient previously 3 pack/day use.   Patient currently  drinking 3-4 beers per day from 12 pack a day.   Patient denies use of chew or other illicit drugs.   Social Determinants of Health   Financial Resource Strain: Low Risk   . Difficulty of Paying Living Expenses: Not hard at all  Food Insecurity: No Food Insecurity  . Worried About Charity fundraiser in the Last Year: Never true  . Ran Out of Food in the Last Year: Never true  Transportation Needs: No Transportation Needs  . Lack of Transportation (Medical): No  . Lack of Transportation (Non-Medical): No  Physical Activity: Insufficiently Active  . Days of Exercise per Week: 2 days  . Minutes of Exercise per Session: 30 min  Stress: Stress Concern Present  . Feeling of Stress : To some extent  Social Connections: Moderately Isolated  . Frequency of Communication with Friends and Family: Once a week  . Frequency of Social Gatherings with Friends and Family: Once a week  . Attends Religious Services:  Never  . Active Member of Clubs or Organizations: No  . Attends Club or Organization Meetings: Never  . Marital Status: Married  Human resources officer Violence: Not At Risk  . Fear of Current or Ex-Partner: No  . Emotionally Abused: No  . Physically Abused: No  . Sexually Abused: No    FAMILY HISTORY:  Family History  Problem Relation Age of Onset  . Hypertension Mother   . Heart disease Father   . Hypertension Father   . Heart disease Sister   . Hypertension Maternal Grandmother   . Hypertension Maternal Grandfather   . Hypertension Paternal Grandmother   . Hypertension Paternal Grandfather     CURRENT MEDICATIONS:  Outpatient Encounter Medications as of 04/22/2019  Medication Sig Note  . acetaminophen (TYLENOL) 325 MG tablet Take 2 tablets (650 mg total) by mouth every 6 (six) hours as needed for mild pain, fever or headache (or Fever >/= 101).   . Amino Acids-Protein Hydrolys (FEEDING SUPPLEMENT, PRO-STAT SUGAR FREE 64,) LIQD Place 30 mLs into feeding tube 3 (three) times  daily with meals.   Marland Kitchen aspirin EC 81 MG tablet Take 81 mg by mouth daily.   Marland Kitchen atorvastatin (LIPITOR) 80 MG tablet Take 1 tablet (80 mg total) by mouth every evening. (Patient taking differently: Take 40 mg by mouth in the morning and at bedtime. )   . Catheters (ARGYLE SUCTION CATHETER 14FR) MISC 1 Device by Does not apply route daily.   . ciprofloxacin (CIPRO) 500 MG tablet Take 1 tablet (500 mg total) by mouth 2 (two) times daily for 7 days. Starting Friday 04/18/2019 after you finish IV antibiotics   . famotidine (PEPCID) 20 MG tablet Take 20 mg by mouth daily.   . ferrous sulfate 325 (65 FE) MG tablet Take 325 mg by mouth daily with breakfast.   . HYDROcodone-acetaminophen (NORCO) 10-325 MG tablet Take 1 tablet by mouth every 4 (four) hours as needed. (Patient taking differently: Take 1 tablet by mouth every 4 (four) hours as needed for severe pain. )   . lidocaine (XYLOCAINE) 2 % solution Patient: Mix 1part 2% viscous lidocaine, 1part H20. Swish & swallow 48m of diluted mixture, 316m before meals and at bedtime, up to QID (Patient taking differently: Use as directed 15 mLs in the mouth or throat See admin instructions. 30 minutes before meals and at bedtime, up to 4 times daily.)   . nitroGLYCERIN (NITROSTAT) 0.4 MG SL tablet Place 1 tablet (0.4 mg total) under the tongue every 5 (five) minutes as needed for chest pain (CP or SOB).   . Nutritional Supplements (FEEDING SUPPLEMENT, OSMOLITE 1.5 CAL,) LIQD Give 2 cartons of osmolite 1.5 at 9:30am, 1:30, 5:30 and 1 carton at 9:30pm.  Flush with 6057mf water before and 120m27mter each feeding. Meets 100% of patient needs. Send bolus supplies. (Patient taking differently: Place 1,000 mLs into feeding tube in the morning, at noon, in the evening, and at bedtime. Give 2 cartons of osmolite 1.5 at 9:30am, 1:30, 5:30 and 1 carton at 9:30pm.  Flush with 60ml56mwater before and 120ml 10mr each feeding. Meets 100% of patient needs. Send bolus supplies.)   .  Nutritional Supplements (PROSOURCE TF) LIQD Give 30ml 26mes per day via feeding tube.  Mix with 60ml of70mer.  Flush tube with 60ml of 3mr, then place prosource and water mixture in tube and flush with 60ml of w6m after. (Patient taking differently: Place 30 mLs into feeding tube in the morning and  at bedtime. Mix with 60m of water.  Flush tube with 621mof water, then place prosource and water mixture in tube and flush with 6066mf water after.)   . omeprazole (PRILOSEC) 20 MG capsule Take 20 mg by mouth daily.    . potassium chloride 20 MEQ/15ML (10%) SOLN Place 15 mLs (20 mEq total) into feeding tube daily for 5 days. 04/08/2019: Pharmacy did not have liquid in stock and had to order; patient was supposed to pick up today (04/08/19) but unable to get due to coming into hospial  . prochlorperazine (COMPAZINE) 10 MG tablet TAKE 1 TABLET(10 MG) BY MOUTH EVERY 6 HOURS AS NEEDED FOR NAUSEA OR VOMITING (Patient taking differently: Take 10 mg by mouth every 6 (six) hours as needed for nausea. )   . Tracheostomy Care KIT 1 kit by Does not apply route daily.   . traMADol (ULTRAM) 50 MG tablet Take 50-100 mg by mouth 4 (four) times daily as needed (pain.).  04/08/2019: Patient has if needed but has not had to take recently  . Water For Irrigation, Sterile (FREE WATER) SOLN Place 100 mLs into feeding tube See admin instructions. Please Flush PEG tube with 1 ounce (30 ml) before giving to feed, and then flush with 3 ounces (59m14mfter giving to feed--- up to 4 times a day    No facility-administered encounter medications on file as of 04/22/2019.    ALLERGIES:  Allergies  Allergen Reactions  . Duloxetine Other (See Comments)    Caused depression  . Prednisone Other (See Comments)    Chest pain     PHYSICAL EXAM:  ECOG Performance status: 1  Vitals:   04/22/19 1100  BP: 98/76  Pulse: 100  Resp: 18  Temp: (!) 97.1 F (36.2 C)  SpO2: 98%   Filed Weights   04/22/19 1100  Weight: 163 lb 6 oz  (74.1 kg)    Physical Exam Vitals reviewed.  Constitutional:      Appearance: Normal appearance.  Neck:     Comments: Tracheostomy site is not bleeding. Cardiovascular:     Rate and Rhythm: Normal rate and regular rhythm.     Heart sounds: Normal heart sounds.  Pulmonary:     Effort: Pulmonary effort is normal.     Breath sounds: Normal breath sounds.  Abdominal:     General: There is no distension.     Palpations: Abdomen is soft. There is no mass.     Comments: PEG site is within normal limits.  Musculoskeletal:        General: No swelling.  Skin:    General: Skin is warm.  Neurological:     General: No focal deficit present.     Mental Status: He is alert and oriented to person, place, and time.  Psychiatric:        Mood and Affect: Mood normal.        Behavior: Behavior normal.      LABORATORY DATA:  I have reviewed the labs as listed.  CBC    Component Value Date/Time   WBC 6.3 04/22/2019 1001   RBC 3.31 (L) 04/22/2019 1001   HGB 10.0 (L) 04/22/2019 1001   HCT 32.0 (L) 04/22/2019 1001   HCT 25.0 (L) 04/02/2019 0437   PLT 415 (H) 04/22/2019 1001   MCV 96.7 04/22/2019 1001   MCH 30.2 04/22/2019 1001   MCHC 31.3 04/22/2019 1001   RDW 15.4 04/22/2019 1001   LYMPHSABS 0.9 04/22/2019 1001   MONOABS 0.8  04/22/2019 1001   EOSABS 0.2 04/22/2019 1001   BASOSABS 0.0 04/22/2019 1001   CMP Latest Ref Rng & Units 04/22/2019 04/14/2019 04/12/2019  Glucose 70 - 99 mg/dL 142(H) 140(H) 116(H)  BUN 6 - 20 mg/dL _0 Creatinine 0.61 - 1.24 mg/dL 0.84 0.63 0.69  Sodium 135 - 145 mmol/L 134(L) 134(L) 137  Potassium 3.5 - 5.1 mmol/L 4.1 3.7 3.5  Chloride 98 - 111 mmol/L 96(L) 99 100  CO2 22 - 32 mmol/L _1 Calcium 8.9 - 10.3 mg/dL 9.2 8.2(L) 8.4(L)  Total Protein 6.5 - 8.1 g/dL 7.4 - -  Total Bilirubin 0.3 - 1.2 mg/dL 0.4 - -  Alkaline Phos 38 - 126 U/L 57 - -  AST 15 - 41 U/L 20 - -  ALT 0 - 44 U/L 18 - -       DIAGNOSTIC IMAGING:  I have reviewed the  scans.     ASSESSMENT & PLAN:   Malignant neoplasm of supraglottis ([facility]) 1.  T3N2C supraglottic squamous cell carcinoma: -Laryngoscopy and biopsy on 01/02/2019, trach placed on 02/18/2019. -Weekly cisplatin and radiation on 03/07/2019. -He was hospitalized from 03/17/2019 through 03/18/2019 secondary to fever and Serratia infection.  He was hospitalized again from 04/01/2019 through 04/05/2019 with bleeding around the trach area. -He was hospitalized from 04/08/2019 through 04/14/2019 with bacteremia.  PICC line was discontinued.  Cultures show Klebsiella and Serratia. -He is currently on Cipro orally.  He will finish it on 04/25/2019. -He does not have any signs of infection at this time.  He started back radiation therapy on 04/17/2019. -I have recommended restarting his chemotherapy next week.  We will arrange for PICC line placement next week prior to restarting treatment.  We reviewed his CBC today which showed normal white count with normal differential.  LFTs are normal.  2.  Nutrition: -He is taking an Osmolite 1.5, 5 cans/day. -He is also eating soft foods like puddings and ice cream.  3.  CVA: -He had pontine stroke and large vessel occlusion. -He has bleeding from trach site and Plavix was held since 03/04/2019.  He is continuing aspirin 81 mg daily.      Orders placed this encounter:  No orders of the defined types were placed in this encounter. Total time spent is 30 minutes with more than 50% of the time spent face-to-face discussing restarting treatment plan, reviewing hospital records, counseling and coordination of care.   [name], MD [place] [phone]

## 2019-04-22 NOTE — Assessment & Plan Note (Signed)
1.  T3N2C supraglottic squamous cell carcinoma: -Laryngoscopy and biopsy on 01/02/2019, trach placed on 02/18/2019. -Weekly cisplatin and radiation on 03/07/2019. -He was hospitalized from 03/17/2019 through 03/18/2019 secondary to fever and Serratia infection.  He was hospitalized again from 04/01/2019 through 04/05/2019 with bleeding around the trach area. -He was hospitalized from 04/08/2019 through 04/14/2019 with bacteremia.  PICC line was discontinued.  Cultures show Klebsiella and Serratia. -He is currently on Cipro orally.  He will finish it on 04/25/2019. -He does not have any signs of infection at this time.  He started back radiation therapy on 04/17/2019. -I have recommended restarting his chemotherapy next week.  We will arrange for PICC line placement next week prior to restarting treatment.  We reviewed his CBC today which showed normal white count with normal differential.  LFTs are normal.  2.  Nutrition: -He is taking an Osmolite 1.5, 5 cans/day. -He is also eating soft foods like puddings and ice cream.  3.  CVA: -He had pontine stroke and large vessel occlusion. -He has bleeding from trach site and Plavix was held since 03/04/2019.  He is continuing aspirin 81 mg daily.

## 2019-04-22 NOTE — Patient Instructions (Addendum)
[place] at [facility] Discharge Instructions  You were seen today by Dr. Delton Coombes. He went over your recent lab results. He will schedule you for a PICC line as well as restarting your treatment. He will see you back in 1 week for labs, treatment and follow up.   Thank you for choosing [place] at [facility] to provide your oncology and hematology care.  To afford each patient quality time with our provider, please arrive at least 15 minutes before your scheduled appointment time.   If you have a lab appointment with the [facility] please come in thru the  Main Entrance and check in at the main information desk  You need to re-schedule your appointment should you arrive 10 or more minutes late.  We strive to give you quality time with our providers, and arriving late affects you and other patients whose appointments are after yours.  Also, if you no show three or more times for appointments you may be dismissed from the clinic at the providers discretion.     Again, thank you for choosing [facility].  Our hope is that these requests will decrease the amount of time that you wait before being seen by our physicians.       _____________________________________________________________  Should you have questions after your visit to [facility], please contact our office at [phone] between the hours of 8:00 a.m. and 4:30 p.m.  Voicemails left after 4:00 p.m. will not be returned until the following business day.  For prescription refill requests, have your pharmacy contact our office and allow 72 hours.    Cancer Center Support Programs:   > Cancer Support Group  2nd Tuesday of the month 1pm-2pm, Journey Room

## 2019-04-23 ENCOUNTER — Ambulatory Visit
Admission: RE | Admit: 2019-04-23 | Discharge: 2019-04-23 | Disposition: A | Discharge status: Home / Self Care | Payer: 59 | Source: Ambulatory Visit | Attending: Radiation Oncology | Admitting: Radiation Oncology

## 2019-04-23 ENCOUNTER — Inpatient Hospital Stay: Payer: 59

## 2019-04-23 ENCOUNTER — Ambulatory Visit: Payer: 59

## 2019-04-23 DIAGNOSIS — Z5111 Encounter for antineoplastic chemotherapy: Secondary | ICD-10-CM | POA: Diagnosis not present

## 2019-04-23 NOTE — Progress Notes (Signed)
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    Met with patient in clinic following radiation appointment.  Patient giving 5-6 cartons of osmolite 1.5 per day.  Reports that he is giving 1 carton at 9:30, 1 1/2 at 12:30, 1 1/2 or 2 cartons at 3:30 and 1 1/2 cartons at 6:30pm.  Flushing with 51m of water before and after.  Giving 1 packet of prostat.  Reports some issues with reflux and PCP has giving medication. Reports it helps some. Reports feeling full.  Also reports diarrhea which he thinks is related to antibiotics.  Reports that PCP told him to take some medication over the counter (? Imodium).  Patient says that if he takes 1 time a day he does not have any loose stools.    Patient not able to drink boost shakes and no taste.  Drinking 2, 16.9 oz bottles of water per day.  Eating some ice cream at times and devil food cake.      Medications: reviewed  Labs: Na 134, glucose 142  Anthropometrics:   Weight 165 lb per Aria 3/22 168 lb per Aria 3/8 172 lb 176 lb 2/4   Estimated Energy Needs  Kcals: 24103-[address]31Protein: 120-140 g Fluid: > 2 L  NUTRITION DIAGNOSIS: Predicted suboptimal energy intake continues with treatment.     INTERVENTION:  Patient to space out tube feeding to 3 1/2-4 hours apart to help with feeling full.   Continue reflux medications. Reviewed proper way of giving tube feeding to help with reflux.  Patient to give 6 cartons of osmolite 1.5, ideally 7 to better meet nutritional needs.  Will increase prostat to 2 packets (664m per day.  Pending patient tolerance may need to increase to 3 packets daily for additional calories and protein.  Reviewed with patient that he must drink at least 2, 16, 9 oz bottles of water daily if not must divide and give via feeding tube for hydration.  Tube feeding provides 2330 calories, 119 g protein and 2590 ml free water (includes oral hydration as well).  Patient  has contact information    MONITORING, EVALUATION, GOAL: Predicted suboptimal energy intake continues relying on feeding tube.    NEXT VISIT: April 28th after radiation  Karishma Unrein B. AlZenia ResidesRDStaffordLDPomariaegistered Dietitian [phone])

## 2019-04-24 ENCOUNTER — Ambulatory Visit: Payer: 59

## 2019-04-24 ENCOUNTER — Ambulatory Visit (HOSPITAL_COMMUNITY): Payer: 59 | Admitting: Hematology

## 2019-04-24 ENCOUNTER — Other Ambulatory Visit (HOSPITAL_COMMUNITY): Payer: 59

## 2019-04-24 ENCOUNTER — Ambulatory Visit
Admission: RE | Admit: 2019-04-24 | Discharge: 2019-04-24 | Disposition: A | Discharge status: Home / Self Care | Payer: 59 | Source: Ambulatory Visit | Attending: Radiation Oncology | Admitting: Radiation Oncology

## 2019-04-24 ENCOUNTER — Ambulatory Visit (HOSPITAL_COMMUNITY): Payer: 59

## 2019-04-24 ENCOUNTER — Encounter (HOSPITAL_COMMUNITY): Payer: Self-pay | Admitting: *Deleted

## 2019-04-24 ENCOUNTER — Other Ambulatory Visit: Payer: Self-pay

## 2019-04-24 DIAGNOSIS — Z5111 Encounter for antineoplastic chemotherapy: Secondary | ICD-10-CM | POA: Diagnosis not present

## 2019-04-24 NOTE — Progress Notes (Signed)
I received a message from [place] from [place].  She states that patient called her reporting a fever.   I contacted patient and he reports that he was running a high temp last night treated it with tylenol several times throughout the night. His temp today has been 98.6.  He has not required any tylenol since this morning.  He denies any additional symptoms or complaints.  I advised him to keep an eye on his temperature and if it starts to climb back up, he should report to the ER.  He does not need to wait to talk with a nurse at either cancer center if his temp is above 100.4.  He verbalizes understanding.

## 2019-04-25 ENCOUNTER — Ambulatory Visit: Payer: 59

## 2019-04-25 ENCOUNTER — Ambulatory Visit
Admission: RE | Admit: 2019-04-25 | Discharge: 2019-04-25 | Disposition: A | Discharge status: Home / Self Care | Payer: 59 | Source: Ambulatory Visit | Attending: Radiation Oncology | Admitting: Radiation Oncology

## 2019-04-25 ENCOUNTER — Other Ambulatory Visit: Payer: Self-pay

## 2019-04-25 DIAGNOSIS — Z5111 Encounter for antineoplastic chemotherapy: Secondary | ICD-10-CM | POA: Diagnosis not present

## 2019-04-25 NOTE — Progress Notes (Signed)
Oncology Nurse Navigator Documentation  I returned [name] voice mail regarding his radiation appointments. He reported that the times had changed from his original schedule. I explained that that happened because of previously cancelled appointments and the [facility] not being able to reschedule him at his previously scheduled time. The [facility] was able to move most appointments back to his normal time. He is aware that his appointment time on Monday is at 1:00 and he will receive an updated schedule at that time that he and his wife should follow until he completes radiation.   Jorge Asa RN, BSN, OCN Head & Neck Oncology Nurse [place] at [facility] Phone # [phone]  Fax # [phone]

## 2019-04-28 ENCOUNTER — Ambulatory Visit: Payer: 59

## 2019-04-28 ENCOUNTER — Other Ambulatory Visit: Payer: Self-pay

## 2019-04-28 ENCOUNTER — Ambulatory Visit
Admission: RE | Admit: 2019-04-28 | Discharge: 2019-04-28 | Disposition: A | Discharge status: Home / Self Care | Payer: 59 | Source: Ambulatory Visit | Attending: Radiation Oncology | Admitting: Radiation Oncology

## 2019-04-28 DIAGNOSIS — Z5111 Encounter for antineoplastic chemotherapy: Secondary | ICD-10-CM | POA: Diagnosis not present

## 2019-04-29 ENCOUNTER — Ambulatory Visit: Payer: 59

## 2019-04-29 ENCOUNTER — Encounter (HOSPITAL_COMMUNITY): Payer: Self-pay | Admitting: *Deleted

## 2019-04-29 ENCOUNTER — Other Ambulatory Visit: Payer: Self-pay

## 2019-04-29 ENCOUNTER — Other Ambulatory Visit (HOSPITAL_COMMUNITY): Payer: Self-pay | Admitting: Hematology

## 2019-04-29 ENCOUNTER — Ambulatory Visit
Admission: RE | Admit: 2019-04-29 | Discharge: 2019-04-29 | Disposition: A | Discharge status: Home / Self Care | Payer: 59 | Source: Ambulatory Visit | Attending: Radiation Oncology | Admitting: Radiation Oncology

## 2019-04-29 DIAGNOSIS — Z5111 Encounter for antineoplastic chemotherapy: Secondary | ICD-10-CM | POA: Diagnosis not present

## 2019-04-29 DIAGNOSIS — C321 Malignant neoplasm of supraglottis: Secondary | ICD-10-CM

## 2019-04-29 NOTE — Progress Notes (Signed)
[name] asked to have his trach collar changed today. He and is wife are not comfortable doing it at home by themselves. He presented after his radiation treatment today with a new trach collar from home. It was changed without difficulty, and patient tolerated well. He reported that it felt like it was on properly and not tight to his neck. I advised that if he needed more assistance with it, I was happy to help him when he is here for radiation treatments as needed. He voiced his understanding.   Harlow Asa RN, BSN, OCN Head & Neck Oncology Nurse [place] at [facility] Phone # [phone]  Fax # [phone]

## 2019-04-29 NOTE — Progress Notes (Signed)

## 2019-04-29 NOTE — Progress Notes (Signed)
Patient's wife called clinic reporting that patient has been short of breath off and on today, has been using his humidifier to help with breathing.  She reports that she checks his oxygen levels and he has dropped to 82-83% and then will recover after using the humidifier. She denies fever, chills, or any other symptoms.  I notified Dr. Delton Coombes and per him, I advised her that if he gets worse to report to the ER for evaluation, we will assess him in the morning at his visit.

## 2019-04-30 ENCOUNTER — Ambulatory Visit
Admission: RE | Admit: 2019-04-30 | Discharge: 2019-04-30 | Disposition: A | Discharge status: Home / Self Care | Payer: 59 | Source: Ambulatory Visit | Attending: Radiation Oncology | Admitting: Radiation Oncology

## 2019-04-30 ENCOUNTER — Other Ambulatory Visit: Payer: Self-pay

## 2019-04-30 ENCOUNTER — Inpatient Hospital Stay: Payer: 59

## 2019-04-30 ENCOUNTER — Ambulatory Visit
Admission: RE | Admit: 2019-04-30 | Discharge: 2019-04-30 | Disposition: A | Discharge status: Home / Self Care | Payer: Self-pay | Source: Ambulatory Visit | Attending: Hematology | Admitting: Hematology

## 2019-04-30 ENCOUNTER — Ambulatory Visit: Payer: 59

## 2019-04-30 ENCOUNTER — Inpatient Hospital Stay (HOSPITAL_COMMUNITY): Payer: 59

## 2019-04-30 DIAGNOSIS — Z5111 Encounter for antineoplastic chemotherapy: Secondary | ICD-10-CM | POA: Diagnosis not present

## 2019-04-30 DIAGNOSIS — C329 Malignant neoplasm of larynx, unspecified: Secondary | ICD-10-CM

## 2019-04-30 NOTE — Progress Notes (Signed)
Nutrition  Patient was a no show for nutrition follow-up appointment today following radiation.    Next f/u planned for 5/5  Zareya Tuckett B. [name], [place], [place] Registered Dietitian [phone] (pager)

## 2019-04-30 NOTE — Progress Notes (Signed)
Peripherally Inserted Central Catheter Placement  The IV Nurse has discussed with the patient and/or persons authorized to consent for the patient, the purpose of this procedure and the potential benefits and risks involved with this procedure.  The benefits include less needle sticks, lab draws from the catheter, and the patient may be discharged home with the catheter. Risks include, but not limited to, infection, bleeding, blood clot (thrombus formation), and puncture of an artery; nerve damage and irregular heartbeat and possibility to perform a PICC exchange if needed/ordered by physician.  Alternatives to this procedure were also discussed.  Bard Power PICC patient education guide, fact sheet on infection prevention and patient information card has been provided to patient /or left at bedside.    PICC Placement Documentation  PICC Single Lumen 04/30/19 PICC Right Brachial 40 cm 0 cm (Active)  Indication for Insertion or Continuance of Line Home intravenous therapies (PICC only) 04/30/19 1000  Exposed Catheter (cm) 0 cm 04/30/19 1000  Site Assessment Clean;Dry;Intact 04/30/19 1000  Line Status Flushed;Saline locked;Blood return noted 04/30/19 1000  Dressing Type Transparent;Securing device 04/30/19 1000  Dressing Status Clean;Dry;Intact;Antimicrobial disc in place 04/30/19 1000  Dressing Change Due 05/07/19 04/30/19 1000       Jeanice Lim M 04/30/2019, 10:35 AM

## 2019-05-01 ENCOUNTER — Other Ambulatory Visit: Payer: Self-pay

## 2019-05-01 ENCOUNTER — Inpatient Hospital Stay (HOSPITAL_COMMUNITY): Payer: 59

## 2019-05-01 ENCOUNTER — Inpatient Hospital Stay (HOSPITAL_BASED_OUTPATIENT_CLINIC_OR_DEPARTMENT_OTHER): Payer: 59 | Admitting: Hematology

## 2019-05-01 ENCOUNTER — Ambulatory Visit
Admission: RE | Admit: 2019-05-01 | Discharge: 2019-05-01 | Disposition: A | Discharge status: Home / Self Care | Payer: 59 | Source: Ambulatory Visit | Attending: Radiation Oncology | Admitting: Radiation Oncology

## 2019-05-01 ENCOUNTER — Encounter (HOSPITAL_COMMUNITY): Payer: Self-pay | Admitting: Hematology

## 2019-05-01 ENCOUNTER — Ambulatory Visit: Payer: 59

## 2019-05-01 VITALS — BP 148/77 | HR 64 | Temp 97.0°F | Resp 18

## 2019-05-01 DIAGNOSIS — Z5111 Encounter for antineoplastic chemotherapy: Secondary | ICD-10-CM | POA: Diagnosis not present

## 2019-05-01 DIAGNOSIS — C321 Malignant neoplasm of supraglottis: Secondary | ICD-10-CM

## 2019-05-01 DIAGNOSIS — C329 Malignant neoplasm of larynx, unspecified: Secondary | ICD-10-CM

## 2019-05-01 LAB — COMPREHENSIVE METABOLIC PANEL
ALT: 33 U/L (ref 0–44)
AST: 26 U/L (ref 15–41)
Albumin: 3.5 g/dL (ref 3.5–5.0)
Alkaline Phosphatase: 104 U/L (ref 38–126)
Anion gap: 9 (ref 5–15)
BUN: 19 mg/dL (ref 6–20)
CO2: 26 mmol/L (ref 22–32)
Calcium: 9.2 mg/dL (ref 8.9–10.3)
Chloride: 95 mmol/L — ABNORMAL LOW (ref 98–111)
Creatinine, Ser: 0.8 mg/dL (ref 0.61–1.24)
GFR calc Af Amer: >60 mL/min (ref >60)
GFR calc non Af Amer: >60 mL/min (ref >60)
Glucose, Bld: 100 mg/dL — ABNORMAL HIGH (ref 70–99)
Potassium: 4 mmol/L (ref 3.5–5.1)
Sodium: 130 mmol/L — ABNORMAL LOW (ref 135–145)
Total Bilirubin: 0.6 mg/dL (ref 0.3–1.2)
Total Protein: 7.9 g/dL (ref 6.5–8.1)

## 2019-05-01 LAB — CBC WITH DIFFERENTIAL/PLATELET
Abs Immature Granulocytes: 0.09 10*3/uL — ABNORMAL HIGH (ref 0.00–0.07)
Basophils Absolute: 0 10*3/uL (ref 0.0–0.1)
Basophils Relative: 0 %
Eosinophils Absolute: 0.4 10*3/uL (ref 0.0–0.5)
Eosinophils Relative: 4 %
HCT: 32.1 % — ABNORMAL LOW (ref 39.0–52.0)
Hemoglobin: 10.3 g/dL — ABNORMAL LOW (ref 13.0–17.0)
Immature Granulocytes: 1 %
Lymphocytes Relative: 8 %
Lymphs Abs: 0.8 10*3/uL (ref 0.7–4.0)
MCH: 30.5 pg (ref 26.0–34.0)
MCHC: 32.1 g/dL (ref 30.0–36.0)
MCV: 95 fL (ref 80.0–100.0)
Monocytes Absolute: 1.1 10*3/uL — ABNORMAL HIGH (ref 0.1–1.0)
Monocytes Relative: 11 %
Neutro Abs: 7.4 10*3/uL (ref 1.7–7.7)
Neutrophils Relative %: 76 %
Platelets: 288 10*3/uL (ref 150–400)
RBC: 3.38 MIL/uL — ABNORMAL LOW (ref 4.22–5.81)
RDW: 17.2 % — ABNORMAL HIGH (ref 11.5–15.5)
WBC: 9.8 10*3/uL (ref 4.0–10.5)
nRBC: 0 % (ref 0.0–0.2)

## 2019-05-01 LAB — MAGNESIUM: Magnesium: 2 mg/dL (ref 1.7–2.4)

## 2019-05-01 MED ORDER — PALONOSETRON HCL INJECTION 0.25 MG/5ML
0.2500 mg | Freq: Once | INTRAVENOUS | Status: AC
  Administered 2019-05-01: 0.25 mg via INTRAVENOUS
  Filled 2019-05-01: qty 5

## 2019-05-01 MED ORDER — SODIUM CHLORIDE 0.9 % IV SOLN
150.0000 mg | Freq: Once | INTRAVENOUS | Status: AC
  Administered 2019-05-01: 150 mg via INTRAVENOUS
  Filled 2019-05-01: qty 150

## 2019-05-01 MED ORDER — SODIUM CHLORIDE 0.9 % IV SOLN: Freq: Once | INTRAVENOUS | Status: DC

## 2019-05-01 MED ORDER — SODIUM CHLORIDE 0.9% FLUSH
10.0000 mL | INTRAVENOUS | Status: DC | PRN
  Administered 2019-05-01: 10 mL

## 2019-05-01 MED ORDER — SODIUM CHLORIDE 0.9 % IV SOLN
40.0000 mg/m2 | Freq: Once | INTRAVENOUS | Status: AC
  Administered 2019-05-01: 79 mg via INTRAVENOUS
  Filled 2019-05-01: qty 79

## 2019-05-01 MED ORDER — SODIUM CHLORIDE 0.9 % IV SOLN: Freq: Once | INTRAVENOUS | Status: AC

## 2019-05-01 MED ORDER — SODIUM CHLORIDE 0.9 % IV SOLN
10.0000 mg | Freq: Once | INTRAVENOUS | Status: AC
  Administered 2019-05-01: 10 mg via INTRAVENOUS
  Filled 2019-05-01: qty 10

## 2019-05-01 MED ORDER — HEPARIN SOD (PORK) LOCK FLUSH 100 UNIT/ML IV SOLN
500.0000 [IU] | Freq: Once | INTRAVENOUS | Status: AC | PRN
  Administered 2019-05-01: 500 [IU]

## 2019-05-01 MED ORDER — POTASSIUM CHLORIDE 2 MEQ/ML IV SOLN
Freq: Once | INTRAVENOUS | Status: AC
  Filled 2019-05-01: qty 10

## 2019-05-01 NOTE — Progress Notes (Signed)
[address], [place] [address]   CLINIC:  Medical Oncology/Hematology  PCP:  [name], [address] [place] [address] [phone]   REASON FOR VISIT:  Follow-up for supraglottic squamous cell carcinoma.  CURRENT THERAPY: Weekly cisplatin.  BRIEF ONCOLOGIC HISTORY:  Oncology History  Malignant neoplasm of supraglottis ([place])  01/22/2019 Initial Diagnosis   Malignant neoplasm of supraglottis ([place])   01/30/2019 Cancer Staging   Staging form: Larynx - Supraglottis, AJCC 8th Edition - Clinical: Stage IVA (cT2, cN2c, cM0) - Signed by [name], MD on 01/30/2019   03/07/2019 -  Chemotherapy   The patient had palonosetron (ALOXI) injection 0.25 mg, 0.25 mg, Intravenous,  Once, 4 of 7 cycles Administration: 0.25 mg (03/13/2019), 0.25 mg (03/07/2019), 0.25 mg (03/27/2019), 0.25 mg (05/01/2019) CISplatin (PLATINOL) 79 mg in sodium chloride 0.9 % 250 mL chemo infusion, 40 mg/m2 = 79 mg, Intravenous,  Once, 4 of 7 cycles Administration: 79 mg (03/13/2019), 79 mg (03/07/2019), 79 mg (03/27/2019), 79 mg (05/01/2019) fosaprepitant (EMEND) 150 mg in sodium chloride 0.9 % 145 mL IVPB, 150 mg, Intravenous,  Once, 4 of 7 cycles Administration: 150 mg (03/13/2019), 150 mg (03/07/2019), 150 mg (03/27/2019), 150 mg (05/01/2019)  for chemotherapy treatment.       CANCER STAGING: Cancer Staging Malignant neoplasm of supraglottis ([place]) Staging form: Larynx - Supraglottis, AJCC 8th Edition - Clinical: Stage IVA (cT2, cN2c, cM0) - Signed by [name], MD on 01/30/2019    INTERVAL HISTORY:  Mr. Devonshire 59 y.o. male seen for follow-up of supraglottic squamous cell carcinoma.  Reports appetite 75%.  Energy levels are 50%.  Denies any nausea vomiting or diarrhea.  Denies any tingling or numbness next noticed.  Finished antibiotics last week.  He had a PICC line placed yesterday.  He is taking in 5 cans of Osmolite 1.5/day.   REVIEW OF  SYSTEMS:  Review of Systems  All other systems reviewed and are negative.    PAST MEDICAL/SURGICAL HISTORY:  Past Medical History:  Diagnosis Date  . Anxiety   . Arthritis    back  . Bradycardia    a. During 11/2012 adm: lopressor decreased.  . Cancer of larynx ([place])   . Carotid artery stenosis   . 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 Coronary artery disease    a. Diag cath 10/2012 for CP/abnormal nuc -> planned PCI s/p LAD atherectomy/DES placement 11/05/12.  Marland Kitchen GERD (gastroesophageal reflux disease)   . History of hiatal hernia   . Hypercholesteremia   . Hypertension   . Stroke ([place]) 07/31/2018   bilateral vertebrobasilar occlusion; "left side weaker thanit was before."   Past Surgical History:  Procedure Laterality Date  . CARDIAC CATHETERIZATION  10/29/2012  . CORONARY ANGIOPLASTY WITH STENT PLACEMENT  11/05/2012  . DIRECT LARYNGOSCOPY N/A 01/02/2019   Procedure: MICRO DIRECT LARYNGOSCOPY BIOPSY OF LARYNGEAL MASS;  Surgeon: [name], MD;  Location: [facility];  Service: ENT;  Laterality: N/A;  . HEMORRHOID SURGERY  ~ 2010  . INSERTION OF MESH N/A 06/02/2015   Procedure: INSERTION OF MESH;  Surgeon: [name], MD;  Location: [facility];  Service: General;  Laterality: N/A;  . IR ANGIO INTRA EXTRACRAN SEL COM CAROTID INNOMINATE BILAT MOD SED  08/02/2018  . IR ANGIO VERTEBRAL SEL VERTEBRAL BILAT MOD SED  08/02/2018  . LAPAROSCOPIC INSERTION GASTROSTOMY TUBE N/A 02/24/2019  Procedure: LAPAROSCOPIC INSERTION GASTROSTOMY TUBE;  Surgeon: [name], MD;  Location: [facility];  Service: General;  Laterality: N/A;  . LEFT HEART CATHETERIZATION WITH CORONARY ANGIOGRAM N/A 10/30/2012   Procedure: LEFT HEART CATHETERIZATION WITH CORONARY ANGIOGRAM;  Surgeon: [name], MD;  Location: [facility];  Service: Cardiovascular;  Laterality: N/A;  . MULTIPLE EXTRACTIONS WITH ALVEOLOPLASTY N/A 02/18/2019   Procedure:  Extraction of tooth #'s 5-7, 10,11,14,20-29, 31 and 32 with alveoloplasty and maxiillary left buccal exostoses reductions;  Surgeon: [name], [name];  Location: [facility];  Service: Oral Surgery;  Laterality: N/A;  . PERCUTANEOUS CORONARY ROTOBLATOR INTERVENTION (PCI-R) N/A 11/05/2012   Procedure: PERCUTANEOUS CORONARY ROTOBLATOR INTERVENTION (PCI-R);  Surgeon: [name], MD;  Location: [facility];  Service: Cardiovascular;  Laterality: N/A;  . TRACHEOSTOMY TUBE PLACEMENT N/A 02/18/2019   Procedure: Tracheostomy;  Surgeon: [name], MD;  Location: [facility];  Service: ENT;  Laterality: N/A;  . UMBILICAL HERNIA REPAIR N/A 06/02/2015   Procedure: UMBILICAL HERNIORRHAPHY WITH MESH;  Surgeon: [name], MD;  [facility];  Service: General;  Laterality: N/A;     SOCIAL HISTORY:  Social History   Socioeconomic History  . Marital status: Married    Spouse name: Not on file  . Number of children: 4  . Years of education: Not on file  . Highest education level: Not on file  Occupational History  . Occupation: Disabled  Tobacco Use  . Smoking status: Former Smoker    Packs/day: 3.00    Years: 32.00    Pack years: 96.00    Types: Cigarettes    Start date: 08/29/1977    Quit date: 11/29/2006    Years since quitting: 12.4  . Smokeless tobacco: Former Systems developer    Types: Chew  . Tobacco comment: 11/05/2012 "chewed tobacco when I play ball; aien't chewed since age 48"  Substance and Sexual Activity  . Alcohol use: Yes    Alcohol/week: 12.0 standard drinks    Types: 12 Cans of beer per week    Comment: None since 02/2019   . Drug use: Yes    Types: Marijuana    Comment: denies on 04/08/19  . Sexual activity: Not Currently  Other Topics Concern  . Not on file  Social History Narrative   Patient is disabled.  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   Patient quit smoking 10 to 12 years ago.  Patient previously 3 pack/day use.   Patient currently drinking 3-4  beers per day from 12 pack a day.   Patient denies use of chew or other illicit drugs.   Social Determinants of Health   Financial Resource Strain: Low Risk   . Difficulty of Paying Living Expenses: Not hard at all  Food Insecurity: No Food Insecurity  . Worried About Charity fundraiser in the Last Year: Never true  . Ran Out of Food in the Last Year: Never true  Transportation Needs: No Transportation Needs  . Lack of Transportation (Medical): No  . Lack of Transportation (Non-Medical): No  Physical Activity: Insufficiently Active  . Days of Exercise per Week: 2 days  . Minutes of Exercise per Session: 30 min  Stress: Stress Concern Present  . Feeling of Stress : To some extent  Social Connections: Moderately Isolated  . Frequency of Communication with Friends and Family: Once a week  . Frequency of Social Gatherings with Friends and Family: Once a week  . Attends Religious Services:  Never  . Active Member of Clubs or Organizations: No  . Attends Club or Organization Meetings: Never  . Marital Status: Married  Human resources officer Violence: Not At Risk  . Fear of Current or Ex-Partner: No  . Emotionally Abused: No  . Physically Abused: No  . Sexually Abused: No    FAMILY HISTORY:  Family History  Problem Relation Age of Onset  . Hypertension Mother   . Heart disease Father   . Hypertension Father   . Heart disease Sister   . Hypertension Maternal Grandmother   . Hypertension Maternal Grandfather   . Hypertension Paternal Grandmother   . Hypertension Paternal Grandfather     CURRENT MEDICATIONS:  Outpatient Encounter Medications as of 05/01/2019  Medication Sig Note  . Amino Acids-Protein Hydrolys (FEEDING SUPPLEMENT, PRO-STAT SUGAR FREE 64,) LIQD Place 30 mLs into feeding tube 3 (three) times daily with meals.   Marland Kitchen amLODipine (NORVASC) 5 MG tablet Take 5 mg by mouth daily.   Marland Kitchen aspirin EC 81 MG tablet Take 81 mg by mouth daily.   Marland Kitchen atorvastatin (LIPITOR) 80 MG tablet  Take 1 tablet (80 mg total) by mouth every evening. (Patient taking differently: Take 40 mg by mouth in the morning and at bedtime. )   . Catheters (ARGYLE SUCTION CATHETER 14FR) MISC 1 Device by Does not apply route daily.   . famotidine (PEPCID) 20 MG tablet Take 20 mg by mouth daily.   . ferrous sulfate 325 (65 FE) MG tablet Take 325 mg by mouth daily with breakfast.   . lidocaine (XYLOCAINE) 2 % solution Patient: Mix 1part 2% viscous lidocaine, 1part H20. Swish & swallow 41m of diluted mixture, 339m before meals and at bedtime, up to QID (Patient taking differently: Use as directed 15 mLs in the mouth or throat See admin instructions. 30 minutes before meals and at bedtime, up to 4 times daily.)   . Nutritional Supplements (FEEDING SUPPLEMENT, OSMOLITE 1.5 CAL,) LIQD Give 2 cartons of osmolite 1.5 at 9:30am, 1:30, 5:30 and 1 carton at 9:30pm.  Flush with 6041mf water before and 120m39mter each feeding. Meets 100% of patient needs. Send bolus supplies. (Patient taking differently: Place 1,000 mLs into feeding tube in the morning, at noon, in the evening, and at bedtime. Give 2 cartons of osmolite 1.5 at 9:30am, 1:30, 5:30 and 1 carton at 9:30pm.  Flush with 60ml43mwater before and 120ml 65mr each feeding. Meets 100% of patient needs. Send bolus supplies.)   . Nutritional Supplements (PROSOURCE TF) LIQD Give 30ml 272mes per day via feeding tube.  Mix with 60ml of92mer.  Flush tube with 60ml of 43mr, then place prosource and water mixture in tube and flush with 60ml of w81m after. (Patient taking differently: Place 30 mLs into feeding tube in the morning and at bedtime. Mix with 60ml of wa44m  Flush tube with 60ml of wat78mthen place prosource and water mixture in tube and flush with 60ml of wate73mter.)   . omeprazole (PRILOSEC) 20 MG capsule Take 20 mg by mouth daily.    . Tracheostomy Care KIT 1 kit by Does not apply route daily.   . Water For Irrigation, Sterile (FREE WATER) SOLN Place 100  mLs into feeding tube See admin instructions. Please Flush PEG tube with 1 ounce (30 ml) before giving to feed, and then flush with 3 ounces (90ml) after g88mg to feed--- up to 4 times a day   . acetaminophen (TYLENOL) 325 MG tablet Take  2 tablets (650 mg total) by mouth every 6 (six) hours as needed for mild pain, fever or headache (or Fever >/= 101). (Patient not taking: Reported on 05/01/2019)   . diphenoxylate-atropine (LOMOTIL) 2.5-0.025 MG tablet Take 2 tablets by mouth 4 (four) times daily as needed.   Marland Kitchen HYDROcodone-acetaminophen (NORCO) 10-325 MG tablet Take 1 tablet by mouth every 4 (four) hours as needed. (Patient not taking: Reported on 05/01/2019)   . nitroGLYCERIN (NITROSTAT) 0.4 MG SL tablet Place 1 tablet (0.4 mg total) under the tongue every 5 (five) minutes as needed for chest pain (CP or SOB). (Patient not taking: Reported on 05/01/2019)   . potassium chloride 20 MEQ/15ML (10%) SOLN Place 15 mLs (20 mEq total) into feeding tube daily for 5 days. 04/08/2019: Pharmacy did not have liquid in stock and had to order; patient was supposed to pick up today (04/08/19) but unable to get due to coming into hospial  . prochlorperazine (COMPAZINE) 10 MG tablet Take 1 tablet (10 mg total) by mouth every 6 (six) hours as needed for nausea. (Patient not taking: Reported on 05/01/2019)   . traMADol (ULTRAM) 50 MG tablet Take 50-100 mg by mouth 4 (four) times daily as needed (pain.).  04/08/2019: Patient has if needed but has not had to take recently   Facility-Administered Encounter Medications as of 05/01/2019  Medication  . [COMPLETED] 0.9 %  sodium chloride infusion    ALLERGIES:  Allergies  Allergen Reactions  . Duloxetine Other (See Comments)    Caused depression  . Prednisone Other (See Comments)    Chest pain     PHYSICAL EXAM:  ECOG Performance status: 1  Vitals:   05/01/19 1003  BP: (!) 144/87  Pulse: 64  Resp: 18  Temp: (!) 97.3 F (36.3 C)  SpO2: 95%   Filed Weights    05/01/19 1003  Weight: 160 lb 14.4 oz (73 kg)    Physical Exam Vitals reviewed.  Constitutional:      Appearance: Normal appearance.  Neck:     Comments: Tracheostomy site is not bleeding. Cardiovascular:     Rate and Rhythm: Normal rate and regular rhythm.     Heart sounds: Normal heart sounds.  Pulmonary:     Effort: Pulmonary effort is normal.     Breath sounds: Normal breath sounds.  Abdominal:     General: There is no distension.     Palpations: Abdomen is soft. There is no mass.     Comments: PEG site is within normal limits.  Musculoskeletal:        General: No swelling.  Skin:    General: Skin is warm.  Neurological:     General: No focal deficit present.     Mental Status: He is alert and oriented to person, place, and time.  Psychiatric:        Mood and Affect: Mood normal.        Behavior: Behavior normal.      LABORATORY DATA:  I have reviewed the labs as listed.  CBC    Component Value Date/Time   WBC 9.8 05/01/2019 1006   RBC 3.38 (L) 05/01/2019 1006   HGB 10.3 (L) 05/01/2019 1006   HCT 32.1 (L) 05/01/2019 1006   HCT 25.0 (L) 04/02/2019 0437   PLT 288 05/01/2019 1006   MCV 95.0 05/01/2019 1006   MCH 30.5 05/01/2019 1006   MCHC 32.1 05/01/2019 1006   RDW 17.2 (H) 05/01/2019 1006   LYMPHSABS 0.8 05/01/2019 1006   MONOABS  1.1 (H) 05/01/2019 1006   EOSABS 0.4 05/01/2019 1006   BASOSABS 0.0 05/01/2019 1006   CMP Latest Ref Rng & Units 05/01/2019 04/22/2019 04/14/2019  Glucose 70 - 99 mg/dL 100(H) 142(H) 140(H)  BUN 6 - 20 mg/dL '19 10 12  '$ Creatinine 0.61 - 1.24 mg/dL 0.80 0.84 0.63  Sodium 135 - 145 mmol/L 130(L) 134(L) 134(L)  Potassium 3.5 - 5.1 mmol/L 4.0 4.1 3.7  Chloride 98 - 111 mmol/L 95(L) 96(L) 99  CO2 22 - 32 mmol/L '26 27 27  '$ Calcium 8.9 - 10.3 mg/dL 9.2 9.2 8.2(L)  Total Protein 6.5 - 8.1 g/dL 7.9 7.4 -  Total Bilirubin 0.3 - 1.2 mg/dL 0.6 0.4 -  Alkaline Phos 38 - 126 U/L 104 57 -  AST 15 - 41 U/L 26 20 -  ALT 0 - 44 U/L 33 18 -        DIAGNOSTIC IMAGING:  I have reviewed scans.   ASSESSMENT & PLAN:   Malignant neoplasm of supraglottis ([place]) 1.  T3N2C supraglottic squamous cell carcinoma: -Laryngoscopy and biopsy on 01/02/2019, trach placed on 02/18/2019. -Weekly cisplatin and radiation on 03/07/2019. -He was hospitalized 3 times since the start of chemoradiation therapy with bacteremia. -He has finished antibiotics last week.  He is feeling better.  He is continuing radiation therapy. -He has PICC line placed yesterday.  I have reviewed his labs.  He will proceed with week 4 of treatment today. -We will see him back next week for his chemotherapy again.  2.  Nutrition: -He is taking an Osmolite 1.5, 5 cans/day. -He lost 3 pounds since last week.  I have told him to increase it to 6 cans/day.  However 6 cans make him sick. -He is also eating soft foods.  3.  CVA: -He had pontine stroke and large vessel occlusion. -He had bleeding from trach site and Plavix was held since 03/04/2019.  He is continuing aspirin 81 mg daily.   Orders placed this encounter:  No orders of the defined types were placed in this encounter.   [name], MD [place] [phone]

## 2019-05-01 NOTE — Progress Notes (Signed)
Patient tolerated chemotherapy with no complaints voiced.  Side effects with management reviewed with understanding verbalized.  PICC site clean and dry with no bruising or swelling noted at site.  Good blood return noted before and after administration of chemotherapy.  Dressing intact.   Patient left by wheelchair with VSS and no s/s of distress noted.

## 2019-05-01 NOTE — Patient Instructions (Addendum)
[facility] at [facility] Discharge Instructions  You were seen today by Dr. Katragadda. He went over your recent lab results. He will see you back in 1 week for labs, treatment and follow up.   Thank you for choosing [facility] at [facility] to provide your oncology and hematology care.  To afford each patient quality time with our provider, please arrive at least 15 minutes before your scheduled appointment time.   If you have a lab appointment with the Cancer Center please come in thru the  Main Entrance and check in at the main information desk  You need to re-schedule your appointment should you arrive 10 or more minutes late.  We strive to give you quality time with our providers, and arriving late affects you and other patients whose appointments are after yours.  Also, if you no show three or more times for appointments you may be dismissed from the clinic at the providers discretion.     Again, thank you for choosing [facility].  Our hope is that these requests will decrease the amount of time that you wait before being seen by our physicians.       _____________________________________________________________  Should you have questions after your visit to  Cancer Center, please contact our office at [phone] between the hours of 8:00 a.m. and 4:30 p.m.  Voicemails left after 4:00 p.m. will not be returned until the following business day.  For prescription refill requests, have your pharmacy contact our office and allow 72 hours.    Cancer Center Support Programs:   > Cancer Support Group  2nd Tuesday of the month 1pm-2pm, Journey Room    

## 2019-05-01 NOTE — Progress Notes (Signed)
Patient has been assessed, vital signs and labs have been reviewed by Dr. Katragadda. ANC, Creatinine, LFTs, and Platelets are within treatment parameters per Dr. Katragadda. The patient is good to proceed with treatment at this time.  

## 2019-05-01 NOTE — Assessment & Plan Note (Signed)
1.  T3N2C supraglottic squamous cell carcinoma: -Laryngoscopy and biopsy on 01/02/2019, trach placed on 02/18/2019. -Weekly cisplatin and radiation on 03/07/2019. -He was hospitalized 3 times since the start of chemoradiation therapy with bacteremia. -He has finished antibiotics last week.  He is feeling better.  He is continuing radiation therapy. -He has PICC line placed yesterday.  I have reviewed his labs.  He will proceed with week 4 of treatment today. -We will see him back next week for his chemotherapy again.  2.  Nutrition: -He is taking an Osmolite 1.5, 5 cans/day. -He lost 3 pounds since last week.  I have told him to increase it to 6 cans/day.  However 6 cans make him sick. -He is also eating soft foods.  3.  CVA: -He had pontine stroke and large vessel occlusion. -He had bleeding from trach site and Plavix was held since 03/04/2019.  He is continuing aspirin 81 mg daily.

## 2019-05-02 ENCOUNTER — Ambulatory Visit
Admission: RE | Admit: 2019-05-02 | Discharge: 2019-05-02 | Disposition: A | Discharge status: Home / Self Care | Payer: 59 | Source: Ambulatory Visit | Attending: Radiation Oncology | Admitting: Radiation Oncology

## 2019-05-02 ENCOUNTER — Ambulatory Visit: Payer: 59

## 2019-05-02 ENCOUNTER — Other Ambulatory Visit: Payer: Self-pay

## 2019-05-02 DIAGNOSIS — Z5111 Encounter for antineoplastic chemotherapy: Secondary | ICD-10-CM | POA: Diagnosis not present

## 2019-05-05 ENCOUNTER — Ambulatory Visit
Admission: RE | Admit: 2019-05-05 | Discharge: 2019-05-05 | Disposition: A | Discharge status: Home / Self Care | Payer: 59 | Source: Ambulatory Visit | Attending: Radiation Oncology | Admitting: Radiation Oncology

## 2019-05-05 ENCOUNTER — Telehealth (HOSPITAL_COMMUNITY): Payer: Self-pay | Admitting: Dentistry

## 2019-05-05 ENCOUNTER — Ambulatory Visit: Payer: 59

## 2019-05-05 ENCOUNTER — Other Ambulatory Visit: Payer: Self-pay

## 2019-05-05 DIAGNOSIS — C321 Malignant neoplasm of supraglottis: Secondary | ICD-10-CM | POA: Diagnosis present

## 2019-05-05 DIAGNOSIS — Z87891 Personal history of nicotine dependence: Secondary | ICD-10-CM | POA: Diagnosis not present

## 2019-05-05 DIAGNOSIS — Z930 Tracheostomy status: Secondary | ICD-10-CM | POA: Diagnosis not present

## 2019-05-05 DIAGNOSIS — Z7982 Long term (current) use of aspirin: Secondary | ICD-10-CM | POA: Diagnosis not present

## 2019-05-05 DIAGNOSIS — Z5111 Encounter for antineoplastic chemotherapy: Secondary | ICD-10-CM | POA: Diagnosis not present

## 2019-05-05 DIAGNOSIS — Z8673 Personal history of transient ischemic attack (TIA), and cerebral infarction without residual deficits: Secondary | ICD-10-CM | POA: Diagnosis not present

## 2019-05-05 DIAGNOSIS — Z931 Gastrostomy status: Secondary | ICD-10-CM | POA: Diagnosis not present

## 2019-05-05 DIAGNOSIS — C770 Secondary and unspecified malignant neoplasm of lymph nodes of head, face and neck: Secondary | ICD-10-CM | POA: Diagnosis not present

## 2019-05-05 NOTE — Telephone Encounter (Signed)
05/05/2019  Patient:            [name] Date of Birth:  08/16/1960 MRN:                [id number]  I called and left a message on the patient's phone requesting that he contact Dental Medicine to confirm or reschedule appointment that was scheduled for him for June 03, 2019 at 10 AM. Patient will be seen for oral examination after chemoradiation therapy.  Patient will also be evaluated for questionable history of exposed bone from dental extraction site.  [name], [name]

## 2019-05-06 ENCOUNTER — Ambulatory Visit
Admission: RE | Admit: 2019-05-06 | Discharge: 2019-05-06 | Disposition: A | Discharge status: Home / Self Care | Payer: 59 | Source: Ambulatory Visit | Attending: Radiation Oncology | Admitting: Radiation Oncology

## 2019-05-06 ENCOUNTER — Ambulatory Visit: Payer: 59

## 2019-05-06 ENCOUNTER — Other Ambulatory Visit: Payer: Self-pay

## 2019-05-06 ENCOUNTER — Telehealth: Payer: Self-pay | Admitting: *Deleted

## 2019-05-06 DIAGNOSIS — Z5111 Encounter for antineoplastic chemotherapy: Secondary | ICD-10-CM | POA: Diagnosis not present

## 2019-05-06 NOTE — Telephone Encounter (Signed)
CALLED PATIENT TO ASK ABOUT COMING FOR FU ON 05-23-19 @ 10:40 AM, LVM FOR A RETURN CALL

## 2019-05-07 ENCOUNTER — Ambulatory Visit
Admission: RE | Admit: 2019-05-07 | Discharge: 2019-05-07 | Disposition: A | Discharge status: Home / Self Care | Payer: 59 | Source: Ambulatory Visit | Attending: Radiation Oncology | Admitting: Radiation Oncology

## 2019-05-07 ENCOUNTER — Ambulatory Visit: Payer: 59

## 2019-05-07 ENCOUNTER — Telehealth: Payer: Self-pay

## 2019-05-07 ENCOUNTER — Inpatient Hospital Stay: Payer: 59

## 2019-05-07 ENCOUNTER — Other Ambulatory Visit: Payer: Self-pay

## 2019-05-07 DIAGNOSIS — Z5111 Encounter for antineoplastic chemotherapy: Secondary | ICD-10-CM | POA: Diagnosis not present

## 2019-05-07 NOTE — Telephone Encounter (Signed)
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  Last radiation tomorrow 5/6.    Patient did not come to see RD last week after radiation or today as planned.    RD called patient this afternoon and he said he forgot.  Says that he is only giving 5 cartons of osmolite 1.5 through feeding tube because a if he gives anymore it makes him "sick".  Asked to clarify sick and states it makes me throw up.  Asked if taking nausea medications and he said yes.  States that he is giving 2 prostat via tube.    Patient says that he is drinking water orally.  Unable to gather additional information from patient at this time.   Patient did not sound like he wanted to speak with RD and when questioned about this he said he needed to go in the insurance office and hung up.  Medications: reviewed  Labs: reviewed  Anthropometrics:   Weight from 5/3 162 lb 8 oz in [facility].  Noted 4/29 at [facility] 160 lb.   3/22 168 lb per [facility] 3/8 172 lb 2/4 176 lb  Estimated Energy Needs  Kcals: 0973-5329 Protein: 120-140 g Fluid: > 2 L  NUTRITION DIAGNOSIS: Predicted suboptimal energy intake continues with treatment   INTERVENTION:  Patient to continue osmolite 1.5, 5 cartons per day. Encouraged taking nausea medications on regular basis to help with nausea.  Continue prostat 87ml daily via feeding tube.  Patient says that he can drink 2 ensure plus or boost plus orally in addition to giving tube feeding.  Regimen tube feeding and oral will provide 2675 calories, 130 g protein, 1260 ml free water in formula only not counting water flush and oral intake.  Patient and wife have contact information.     MONITORING, EVALUATION, GOAL: weight, tube feeding tolerance, intake   NEXT VISIT: Phone f/u May 19  Avantika Shere B. [name], [place], [place] Registered Dietitian [phone] (pager)

## 2019-05-08 ENCOUNTER — Other Ambulatory Visit: Payer: Self-pay

## 2019-05-08 ENCOUNTER — Inpatient Hospital Stay (HOSPITAL_COMMUNITY): Payer: 59 | Attending: Hematology

## 2019-05-08 ENCOUNTER — Inpatient Hospital Stay (HOSPITAL_BASED_OUTPATIENT_CLINIC_OR_DEPARTMENT_OTHER): Payer: 59 | Admitting: Hematology

## 2019-05-08 ENCOUNTER — Ambulatory Visit
Admission: RE | Admit: 2019-05-08 | Discharge: 2019-05-08 | Disposition: A | Discharge status: Home / Self Care | Payer: 59 | Source: Ambulatory Visit | Attending: Radiation Oncology | Admitting: Radiation Oncology

## 2019-05-08 ENCOUNTER — Inpatient Hospital Stay (HOSPITAL_COMMUNITY): Payer: 59

## 2019-05-08 ENCOUNTER — Encounter: Payer: Self-pay | Admitting: Radiation Oncology

## 2019-05-08 ENCOUNTER — Encounter (HOSPITAL_COMMUNITY): Payer: Self-pay | Admitting: Hematology

## 2019-05-08 VITALS — BP 149/79 | HR 60 | Temp 98.5°F | Resp 16

## 2019-05-08 DIAGNOSIS — C321 Malignant neoplasm of supraglottis: Secondary | ICD-10-CM

## 2019-05-08 DIAGNOSIS — Z87891 Personal history of nicotine dependence: Secondary | ICD-10-CM | POA: Insufficient documentation

## 2019-05-08 DIAGNOSIS — C770 Secondary and unspecified malignant neoplasm of lymph nodes of head, face and neck: Secondary | ICD-10-CM | POA: Insufficient documentation

## 2019-05-08 DIAGNOSIS — Z930 Tracheostomy status: Secondary | ICD-10-CM | POA: Insufficient documentation

## 2019-05-08 DIAGNOSIS — Z7982 Long term (current) use of aspirin: Secondary | ICD-10-CM | POA: Insufficient documentation

## 2019-05-08 DIAGNOSIS — Z5111 Encounter for antineoplastic chemotherapy: Secondary | ICD-10-CM | POA: Insufficient documentation

## 2019-05-08 DIAGNOSIS — C329 Malignant neoplasm of larynx, unspecified: Secondary | ICD-10-CM

## 2019-05-08 DIAGNOSIS — Z931 Gastrostomy status: Secondary | ICD-10-CM | POA: Insufficient documentation

## 2019-05-08 DIAGNOSIS — Z8673 Personal history of transient ischemic attack (TIA), and cerebral infarction without residual deficits: Secondary | ICD-10-CM | POA: Insufficient documentation

## 2019-05-08 LAB — COMPREHENSIVE METABOLIC PANEL
ALT: 20 U/L (ref 0–44)
AST: 19 U/L (ref 15–41)
Albumin: 3.7 g/dL (ref 3.5–5.0)
Alkaline Phosphatase: 94 U/L (ref 38–126)
Anion gap: 9 (ref 5–15)
BUN: 19 mg/dL (ref 6–20)
CO2: 27 mmol/L (ref 22–32)
Calcium: 9.1 mg/dL (ref 8.9–10.3)
Chloride: 95 mmol/L — ABNORMAL LOW (ref 98–111)
Creatinine, Ser: 0.76 mg/dL (ref 0.61–1.24)
GFR calc Af Amer: >60 mL/min (ref >60)
GFR calc non Af Amer: >60 mL/min (ref >60)
Glucose, Bld: 100 mg/dL — ABNORMAL HIGH (ref 70–99)
Potassium: 3.8 mmol/L (ref 3.5–5.1)
Sodium: 131 mmol/L — ABNORMAL LOW (ref 135–145)
Total Bilirubin: 0.6 mg/dL (ref 0.3–1.2)
Total Protein: 7.7 g/dL (ref 6.5–8.1)

## 2019-05-08 LAB — CBC WITH DIFFERENTIAL/PLATELET
Abs Immature Granulocytes: 0.04 10*3/uL (ref 0.00–0.07)
Basophils Absolute: 0.1 10*3/uL (ref 0.0–0.1)
Basophils Relative: 1 %
Eosinophils Absolute: 0.5 10*3/uL (ref 0.0–0.5)
Eosinophils Relative: 5 %
HCT: 30.8 % — ABNORMAL LOW (ref 39.0–52.0)
Hemoglobin: 10 g/dL — ABNORMAL LOW (ref 13.0–17.0)
Immature Granulocytes: 0 %
Lymphocytes Relative: 6 %
Lymphs Abs: 0.7 10*3/uL (ref 0.7–4.0)
MCH: 30.3 pg (ref 26.0–34.0)
MCHC: 32.5 g/dL (ref 30.0–36.0)
MCV: 93.3 fL (ref 80.0–100.0)
Monocytes Absolute: 1.1 10*3/uL — ABNORMAL HIGH (ref 0.1–1.0)
Monocytes Relative: 10 %
Neutro Abs: 8.7 10*3/uL — ABNORMAL HIGH (ref 1.7–7.7)
Neutrophils Relative %: 78 %
Platelets: 225 10*3/uL (ref 150–400)
RBC: 3.3 MIL/uL — ABNORMAL LOW (ref 4.22–5.81)
RDW: 16.8 % — ABNORMAL HIGH (ref 11.5–15.5)
WBC: 11.1 10*3/uL — ABNORMAL HIGH (ref 4.0–10.5)
nRBC: 0 % (ref 0.0–0.2)

## 2019-05-08 LAB — MAGNESIUM: Magnesium: 1.8 mg/dL (ref 1.7–2.4)

## 2019-05-08 MED ORDER — SODIUM CHLORIDE 0.9 % IV SOLN
150.0000 mg | Freq: Once | INTRAVENOUS | Status: AC
  Administered 2019-05-08: 150 mg via INTRAVENOUS
  Filled 2019-05-08: qty 150

## 2019-05-08 MED ORDER — HEPARIN SOD (PORK) LOCK FLUSH 100 UNIT/ML IV SOLN
500.0000 [IU] | Freq: Once | INTRAVENOUS | Status: AC | PRN
  Administered 2019-05-08: 500 [IU]

## 2019-05-08 MED ORDER — SODIUM CHLORIDE 0.9% FLUSH
10.0000 mL | INTRAVENOUS | Status: DC | PRN
  Administered 2019-05-08: 10 mL

## 2019-05-08 MED ORDER — SODIUM CHLORIDE 0.9 % IV SOLN
10.0000 mg | Freq: Once | INTRAVENOUS | Status: AC
  Administered 2019-05-08: 10 mg via INTRAVENOUS
  Filled 2019-05-08: qty 10

## 2019-05-08 MED ORDER — POTASSIUM CHLORIDE 2 MEQ/ML IV SOLN
Freq: Once | INTRAVENOUS | Status: AC
  Filled 2019-05-08: qty 10

## 2019-05-08 MED ORDER — SODIUM CHLORIDE 0.9 % IV SOLN: Freq: Once | INTRAVENOUS | Status: AC

## 2019-05-08 MED ORDER — SODIUM CHLORIDE 0.9 % IV SOLN
40.0000 mg/m2 | Freq: Once | INTRAVENOUS | Status: AC
  Administered 2019-05-08: 79 mg via INTRAVENOUS
  Filled 2019-05-08: qty 79

## 2019-05-08 MED ORDER — PALONOSETRON HCL INJECTION 0.25 MG/5ML
0.2500 mg | Freq: Once | INTRAVENOUS | Status: AC
  Administered 2019-05-08: 0.25 mg via INTRAVENOUS
  Filled 2019-05-08: qty 5

## 2019-05-08 NOTE — Patient Instructions (Signed)
[facility] at [facility] Discharge Instructions  You were seen today by Dr. Katragadda. He went over your recent lab results. He will see you back in 1 week for labs and follow up.   Thank you for choosing [facility] at [facility] to provide your oncology and hematology care.  To afford each patient quality time with our provider, please arrive at least 15 minutes before your scheduled appointment time.   If you have a lab appointment with the Cancer Center please come in thru the  Main Entrance and check in at the main information desk  You need to re-schedule your appointment should you arrive 10 or more minutes late.  We strive to give you quality time with our providers, and arriving late affects you and other patients whose appointments are after yours.  Also, if you no show three or more times for appointments you may be dismissed from the clinic at the providers discretion.     Again, thank you for choosing [facility].  Our hope is that these requests will decrease the amount of time that you wait before being seen by our physicians.       _____________________________________________________________  Should you have questions after your visit to  Cancer Center, please contact our office at [phone] between the hours of 8:00 a.m. and 4:30 p.m.  Voicemails left after 4:00 p.m. will not be returned until the following business day.  For prescription refill requests, have your pharmacy contact our office and allow 72 hours.    Cancer Center Support Programs:   > Cancer Support Group  2nd Tuesday of the month 1pm-2pm, Journey Room    

## 2019-05-08 NOTE — Progress Notes (Signed)
Patient has been assessed, vital signs and labs have been reviewed by Dr. Katragadda. ANC, Creatinine, LFTs, and Platelets are within treatment parameters per Dr. Katragadda. The patient is good to proceed with treatment at this time.  

## 2019-05-08 NOTE — Progress Notes (Signed)
Oncology Nurse Navigator Documentation  Met with Mr. Mccutchan after final RT to offer support and to celebrate end of radiation treatment.   Provided verbal/written post-RT guidance:  Importance of keeping all follow-up appts, especially those with Nutrition and SLP.  Importance of protecting treatment area from sun.  Continuation of Sonafine application 2-3 times daily, application of antibiotic ointment to areas of raw skin; when supply of Sonafine exhausted transition to OTC lotion with vitamin E. Provided/reviewed Epic calendar of upcoming appts. Explained my role as navigator will continue for several more months, encouraged him to call me with needs/concerns.    Harlow Asa RN, BSN, OCN Head & Neck Oncology Nurse [place] at [facility] Phone # [phone]  Fax # [phone]

## 2019-05-08 NOTE — Progress Notes (Signed)

## 2019-05-08 NOTE — Progress Notes (Signed)
[name],  [id number]   CLINIC:  Medical Oncology/Hematology  PCP:  [name], [place] [address] [phone]   REASON FOR VISIT:  Follow-up for supraglottic squamous cell carcinoma.  CURRENT THERAPY: Weekly cisplatin.  BRIEF ONCOLOGIC HISTORY:  Oncology History  Malignant neoplasm of supraglottis ([place])  01/22/2019 Initial Diagnosis   Malignant neoplasm of supraglottis ([place])   01/30/2019 Cancer Staging   Staging form: Larynx - Supraglottis, AJCC 8th Edition - Clinical: Stage IVA (cT2, cN2c, cM0) - Signed by [name], MD on 01/30/2019   03/07/2019 -  Chemotherapy   The patient had palonosetron (ALOXI) injection 0.25 mg, 0.25 mg, Intravenous,  Once, 5 of 7 cycles Administration: 0.25 mg (03/13/2019), 0.25 mg (03/07/2019), 0.25 mg (03/27/2019), 0.25 mg (05/01/2019) CISplatin (PLATINOL) 79 mg in sodium chloride 0.9 % 250 mL chemo infusion, 40 mg/m2 = 79 mg, Intravenous,  Once, 5 of 7 cycles Administration: 79 mg (03/13/2019), 79 mg (03/07/2019), 79 mg (03/27/2019), 79 mg (05/01/2019) fosaprepitant (EMEND) 150 mg in sodium chloride 0.9 % 145 mL IVPB, 150 mg, Intravenous,  Once, 5 of 7 cycles Administration: 150 mg (03/13/2019), 150 mg (03/07/2019), 150 mg (03/27/2019), 150 mg (05/01/2019)  for chemotherapy treatment.       CANCER STAGING: Cancer Staging Malignant neoplasm of supraglottis ([place]) Staging form: Larynx - Supraglottis, AJCC 8th Edition - Clinical: Stage IVA (cT2, cN2c, cM0) - Signed by [name], MD on 01/30/2019    INTERVAL HISTORY:  [name] 59 y.o. male seen for follow-up of supraglottic squamous cell carcinoma.  Reports appetite and energy levels of 50%.  Taking in 5 cans of Osmolite 1.5.  He is also drinking two 16.9 ounces of water.  He flushes his PEG tube with 60 ml of water after each tube feed.  Denies any nausea, vomiting.  He has very mild diarrhea.  Chronic cough is  stable.  Reports some sore throat.Denies any tingling or numbness in the extremities.   REVIEW OF SYSTEMS:  Review of Systems  HENT:   Positive for sore throat.   Respiratory: Positive for cough.   All other systems reviewed and are negative.    PAST MEDICAL/SURGICAL HISTORY:  Past Medical History:  Diagnosis Date   Anxiety    Arthritis    back   Bradycardia    a. During 11/2012 adm: lopressor decreased.   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 Diag cath 10/2012 for CP/abnormal nuc -> planned PCI s/p LAD atherectomy/DES placement 11/05/12.   GERD (gastroesophageal reflux disease)    History of hiatal hernia    Hypercholesteremia    Hypertension    Stroke ([place]) 07/31/2018   bilateral vertebrobasilar occlusion; "left side weaker thanit was before."   Past Surgical History:  Procedure Laterality Date   CARDIAC CATHETERIZATION  10/29/2012   CORONARY ANGIOPLASTY WITH STENT PLACEMENT  11/05/2012   DIRECT LARYNGOSCOPY N/A 01/02/2019   Procedure: MICRO DIRECT LARYNGOSCOPY BIOPSY OF LARYNGEAL MASS;  Surgeon: [name], MD;  Location: [facility];  Service: ENT;  Laterality: N/A;   HEMORRHOID SURGERY  ~ 2010   INSERTION OF MESH N/A 06/02/2015   Procedure: INSERTION OF MESH;  Surgeon: [name], MD;  Location: [facility];  Service: General;  Laterality: N/A;  IR ANGIO INTRA EXTRACRAN SEL COM CAROTID INNOMINATE BILAT MOD SED  08/02/2018   IR ANGIO VERTEBRAL SEL VERTEBRAL BILAT MOD SED  08/02/2018   LAPAROSCOPIC INSERTION GASTROSTOMY TUBE N/A 02/24/2019   Procedure: LAPAROSCOPIC INSERTION GASTROSTOMY TUBE;  Surgeon: [name], MD;  Location: [facility];  Service: General;  Laterality: N/A;   LEFT HEART CATHETERIZATION WITH CORONARY ANGIOGRAM N/A 10/30/2012   Procedure: LEFT HEART CATHETERIZATION WITH CORONARY  ANGIOGRAM;  Surgeon: [name], MD;  Location: [facility];  Service: Cardiovascular;  Laterality: N/A;   MULTIPLE EXTRACTIONS WITH ALVEOLOPLASTY N/A 02/18/2019   Procedure: Extraction of tooth #'s 5-7, 10,11,14,20-29, 31 and 32 with alveoloplasty and maxiillary left buccal exostoses reductions;  Surgeon: [name], [name];  Location: [facility];  Service: Oral Surgery;  Laterality: N/A;   PERCUTANEOUS CORONARY ROTOBLATOR INTERVENTION (PCI-R) N/A 11/05/2012   Procedure: PERCUTANEOUS CORONARY ROTOBLATOR INTERVENTION (PCI-R);  Surgeon: [name], MD;  Location: [facility];  Service: Cardiovascular;  Laterality: N/A;   TRACHEOSTOMY TUBE PLACEMENT N/A 02/18/2019   Procedure: Tracheostomy;  Surgeon: [name], MD;  Location: [facility];  Service: ENT;  Laterality: N/A;   UMBILICAL HERNIA REPAIR N/A 06/02/2015   Procedure: UMBILICAL HERNIORRHAPHY WITH MESH;  Surgeon: [name], MD;  [facility];  Service: General;  Laterality: N/A;     SOCIAL HISTORY:  Social History   Socioeconomic History   Marital status: Married    Spouse name: Not on file   Number of children: 4   Years of education: Not on file   Highest education level: Not on file  Occupational History   Occupation: Disabled  Tobacco Use   Smoking status: Former Smoker    Packs/day: 3.00    Years: 32.00    Pack years: 96.00    Types: Cigarettes    Start date: 08/29/1977    Quit date: 11/29/2006    Years since quitting: 12.4   Smokeless tobacco: Former Systems developer    Types: Chew   Tobacco comment: 11/05/2012 "chewed tobacco when I play ball; aien't chewed since age 53"  Substance and Sexual Activity   Alcohol use: Yes    Alcohol/week: 12.0 standard drinks    Types: 12 Cans of beer per week    Comment: None since 02/2019    Drug use: Yes    Types: Marijuana    Comment: denies on 04/08/19   Sexual activity: Not Currently  Other Topics Concern   Not on file  Social History Narrative   Patient is  disabled.  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   Patient quit smoking 10 to 12 years ago.  Patient previously 3 pack/day use.   Patient currently drinking 3-4 beers per day from 12 pack a day.   Patient denies use of chew or other illicit drugs.   Social Determinants of Health   Financial Resource Strain: Low Risk    Difficulty of Paying Living Expenses: Not hard at all  Food Insecurity: No Food Insecurity   Worried About Charity fundraiser in the Last Year: Never true   [place] in the Last Year: Never true  Transportation Needs: No Transportation Needs   Lack of Transportation (Medical): No   Lack of Transportation (Non-Medical): No  Physical Activity: Insufficiently Active   Days of Exercise per Week: 2 days   Minutes of Exercise per Session: 30 min  Stress: Stress Concern Present   Feeling of Stress : To some extent  Social Connections: Moderately Isolated   Frequency of Communication with Friends and Family: Once a week   Frequency of Social Gatherings with Friends and Family: Once a week   Attends Religious Services: Never   Marine scientist or Organizations: No   Attends Music therapist: Never   Marital Status: Married  Human resources officer Violence: Not At Risk   Fear of Current or Ex-Partner: No   Emotionally Abused: No   Physically Abused: No   Sexually Abused: No    FAMILY HISTORY:  Family History  Problem Relation Age of Onset   Hypertension Mother    Heart disease Father    Hypertension Father    Heart disease Sister    Hypertension Maternal Grandmother    Hypertension Maternal Grandfather    Hypertension Paternal Grandmother    Hypertension Paternal Grandfather     CURRENT MEDICATIONS:  Outpatient Encounter Medications as of 05/08/2019  Medication Sig Note   acetaminophen (TYLENOL) 325 MG tablet Take 2 tablets (650 mg total) by mouth every 6 (six) hours as needed for mild  pain, fever or headache (or Fever >/= 101). (Patient not taking: Reported on 05/01/2019)    Amino Acids-Protein Hydrolys (FEEDING SUPPLEMENT, PRO-STAT SUGAR FREE 64,) LIQD Place 30 mLs into feeding tube 3 (three) times daily with meals.    amLODipine (NORVASC) 5 MG tablet Take 5 mg by mouth daily.    aspirin EC 81 MG tablet Take 81 mg by mouth daily.    atorvastatin (LIPITOR) 80 MG tablet Take 1 tablet (80 mg total) by mouth every evening. (Patient taking differently: Take 40 mg by mouth in the morning and at bedtime. )    Catheters (ARGYLE SUCTION CATHETER 14FR) MISC 1 Device by Does not apply route daily.    diphenoxylate-atropine (LOMOTIL) 2.5-0.025 MG tablet Take 2 tablets by mouth 4 (four) times daily as needed.    famotidine (PEPCID) 20 MG tablet Take 20 mg by mouth daily.    ferrous sulfate 325 (65 FE) MG tablet Take 325 mg by mouth daily with breakfast.    HYDROcodone-acetaminophen (NORCO) 10-325 MG tablet Take 1 tablet by mouth every 4 (four) hours as needed. (Patient not taking: Reported on 05/01/2019)    lidocaine (XYLOCAINE) 2 % solution Patient: Mix 1part 2% viscous lidocaine, 1part H20. Swish & swallow 60m of diluted mixture, 331m before meals and at bedtime, up to QID (Patient taking differently: Use as directed 15 mLs in the mouth or throat See admin instructions. 30 minutes before meals and at bedtime, up to 4 times daily.)    nitroGLYCERIN (NITROSTAT) 0.4 MG SL tablet Place 1 tablet (0.4 mg total) under the tongue every 5 (five) minutes as needed for chest pain (CP or SOB). (Patient not taking: Reported on 05/01/2019)    Nutritional Supplements (FEEDING SUPPLEMENT, OSMOLITE 1.5 CAL,) LIQD Give 2 cartons of osmolite 1.5 at 9:30am, 1:30, 5:30 and 1 carton at 9:30pm.  Flush with 6069mf water before and 120m27mter each feeding. Meets 100% of patient needs. Send bolus supplies. (Patient taking differently: Place 1,000 mLs into feeding tube in the morning, at noon, in the  evening, and at bedtime. Give 2 cartons of osmolite 1.5 at 9:30am, 1:30, 5:30 and 1 carton at 9:30pm.  Flush with 60ml31mwater before and 120ml 19mr each feeding. Meets 100% of patient needs. Send bolus supplies.)    Nutritional Supplements (PROSOURCE TF) LIQD Give 30ml 257mes per day via feeding tube.  Mix with 60ml of73m  water.  Flush tube with 71m of water, then place prosource and water mixture in tube and flush with 649mof water after. (Patient taking differently: Place 30 mLs into feeding tube in the morning and at bedtime. Mix with 6037mf water.  Flush tube with 83m89m water, then place prosource and water mixture in tube and flush with 83ml94mwater after.)    omeprazole (PRILOSEC) 20 MG capsule Take 20 mg by mouth daily.     potassium chloride 20 MEQ/15ML (10%) SOLN Place 15 mLs (20 mEq total) into feeding tube daily for 5 days. 04/08/2019: Pharmacy did not have liquid in stock and had to order; patient was supposed to pick up today (04/08/19) but unable to get due to coming into hospial   prochlorperazine (COMPAZINE) 10 MG tablet Take 1 tablet (10 mg total) by mouth every 6 (six) hours as needed for nausea. (Patient not taking: Reported on 05/01/2019)    Tracheostomy Care KIT 1 kit by Does not apply route daily.    traMADol (ULTRAM) 50 MG tablet Take 50-100 mg by mouth 4 (four) times daily as needed (pain.).  04/08/2019: Patient has if needed but has not had to take recently   Water For Irrigation, Sterile (FREE WATER) SOLN Place 100 mLs into feeding tube See admin instructions. Please Flush PEG tube with 1 ounce (30 ml) before giving to feed, and then flush with 3 ounces (90ml)12mer giving to feed--- up to 4 times a day    No facility-administered encounter medications on file as of 05/08/2019.    ALLERGIES:  Allergies  Allergen Reactions   Duloxetine Other (See Comments)    Caused depression   Prednisone Other (See Comments)    Chest pain     PHYSICAL EXAM:  ECOG Performance  status: 1  Vitals:   05/08/19 0918  BP: 132/81  Pulse: (!) 58  Resp: 18  Temp: (!) 96.8 F (36 C)  SpO2: 98%   Filed Weights   05/08/19 0918  Weight: 158 lb 3.2 oz (71.8 kg)    Physical Exam Vitals reviewed.  Constitutional:      Appearance: Normal appearance.  Neck:     Comments: Tracheostomy site is not bleeding. Cardiovascular:     Rate and Rhythm: Normal rate and regular rhythm.     Heart sounds: Normal heart sounds.  Pulmonary:     Effort: Pulmonary effort is normal.     Breath sounds: Normal breath sounds.  Abdominal:     General: There is no distension.     Palpations: Abdomen is soft. There is no mass.     Comments: PEG site is within normal limits.  Musculoskeletal:        General: No swelling.  Skin:    General: Skin is warm.  Neurological:     General: No focal deficit present.     Mental Status: He is alert and oriented to person, place, and time.  Psychiatric:        Mood and Affect: Mood normal.        Behavior: Behavior normal.      LABORATORY DATA:  I have reviewed the labs as listed.  CBC    Component Value Date/Time   WBC 11.1 (H) 05/08/2019 0921   RBC 3.30 (L) 05/08/2019 0921   HGB 10.0 (L) 05/08/2019 0921   HCT 30.8 (L) 05/08/2019 0921   HCT 25.0 (L) 04/02/2019 0437   PLT 225 05/08/2019 0921   MCV 93.3 05/08/2019 0921  MCH 30.3 05/08/2019 0921   MCHC 32.5 05/08/2019 0921   RDW 16.8 (H) 05/08/2019 0921   LYMPHSABS 0.7 05/08/2019 0921   MONOABS 1.1 (H) 05/08/2019 0921   EOSABS 0.5 05/08/2019 0921   BASOSABS 0.1 05/08/2019 0921   CMP Latest Ref Rng & Units 05/08/2019 05/01/2019 04/22/2019  Glucose 70 - 99 mg/dL 100(H) 100(H) 142(H)  BUN 6 - 20 mg/dL '19 19 10  '$ Creatinine 0.61 - 1.24 mg/dL 0.76 0.80 0.84  Sodium 135 - 145 mmol/L 131(L) 130(L) 134(L)  Potassium 3.5 - 5.1 mmol/L 3.8 4.0 4.1  Chloride 98 - 111 mmol/L 95(L) 95(L) 96(L)  CO2 22 - 32 mmol/L '27 26 27  '$ Calcium 8.9 - 10.3 mg/dL 9.1 9.2 9.2  Total Protein 6.5 - 8.1 g/dL 7.7  7.9 7.4  Total Bilirubin 0.3 - 1.2 mg/dL 0.6 0.6 0.4  Alkaline Phos 38 - 126 U/L 94 104 57  AST 15 - 41 U/L '19 26 20  '$ ALT 0 - 44 U/L 20 33 18       DIAGNOSTIC IMAGING:  I have reviewed his scans.   ASSESSMENT & PLAN:   Malignant neoplasm of supraglottis ([place]) 1.  T3N2C supraglottic squamous cell carcinoma: -Laryngoscopy and biopsy on 01/02/2019, trach placed on 02/18/2019. -PET scan on 01/27/2019 showed hypermetabolic laryngeal lesion with hypermetabolic cervical metastatic disease bilaterally. -Weekly cisplatin and radiation on 03/07/2019. -He was hospitalized 3 times since the start of chemoradiation therapy with bacteremia.  Hence he missed few weeks of chemotherapy. -Week 4 of cisplatin on 05/01/2019.  He finished radiation therapy today. -We reviewed his labs.  White count and platelets are normal.  LFTs and creatinine are normal. -He will proceed with week 5 today.  I plan to see him back next week and treat him with cisplatin as he missed few doses previously.  2.  Nutrition: -He is taking in Osmolite 1.5, 5 cans/day.  He lost 2 pounds. -He cannot take more than 5 cans as it causes nausea. -He talked with Desmond Lope, our dietitian.  He was told to drink 2 cans of Ensure or boost per day in addition to the tube feeds.  3.  CVA: -He had pontine stroke with large vessel occlusion. -He had bleeding from trach site and Plavix was held since 03/04/2019.  He is continuing aspirin 81 mg daily.   Orders placed this encounter:  No orders of the defined types were placed in this encounter.   [name], MD [place] [phone]

## 2019-05-08 NOTE — Progress Notes (Signed)
Labs reviewed with MD today. Proceed with treatment per MD.   Treatment given per orders. Patient tolerated it well without problems. Vitals stable and discharged home from clinic via wheelchair. Follow up as scheduled.

## 2019-05-08 NOTE — Assessment & Plan Note (Addendum)
1.  T3N2C supraglottic squamous cell carcinoma: -Laryngoscopy and biopsy on 01/02/2019, trach placed on 02/18/2019. -PET scan on 01/27/2019 showed hypermetabolic laryngeal lesion with hypermetabolic cervical metastatic disease bilaterally. -Weekly cisplatin and radiation on 03/07/2019. -He was hospitalized 3 times since the start of chemoradiation therapy with bacteremia.  Hence he missed few weeks of chemotherapy. -Week 4 of cisplatin on 05/01/2019.  He finished radiation therapy today. -We reviewed his labs.  White count and platelets are normal.  LFTs and creatinine are normal. -He will proceed with week 5 today.  I plan to see him back next week and treat him with cisplatin as he missed few doses previously.  2.  Nutrition: -He is taking in Osmolite 1.5, 5 cans/day.  He lost 2 pounds. -He cannot take more than 5 cans as it causes nausea. -He talked with Desmond Lope, our dietitian.  He was told to drink 2 cans of Ensure or boost per day in addition to the tube feeds.  3.  CVA: -He had pontine stroke with large vessel occlusion. -He had bleeding from trach site and Plavix was held since 03/04/2019.  He is continuing aspirin 81 mg daily.

## 2019-05-08 NOTE — Patient Instructions (Signed)
[facility] Discharge Instructions for Patients Receiving Chemotherapy  Today you received the following chemotherapy agents   To help prevent nausea and vomiting after your treatment, we encourage you to take your nausea medication   If you develop nausea and vomiting that is not controlled by your nausea medication, call the clinic.   BELOW ARE SYMPTOMS THAT SHOULD BE REPORTED IMMEDIATELY:  *FEVER GREATER THAN 100.5 F  *CHILLS WITH OR WITHOUT FEVER  NAUSEA AND VOMITING THAT IS NOT CONTROLLED WITH YOUR NAUSEA MEDICATION  *UNUSUAL SHORTNESS OF BREATH  *UNUSUAL BRUISING OR BLEEDING  TENDERNESS IN MOUTH AND THROAT WITH OR WITHOUT PRESENCE OF ULCERS  *URINARY PROBLEMS  *BOWEL PROBLEMS  UNUSUAL RASH Items with * indicate a potential emergency and should be followed up as soon as possible.  Feel free to call the clinic should you have any questions or concerns. The clinic phone number is [phone].  Please show the CHEMO ALERT CARD at check-in to the Emergency Department and triage nurse.   

## 2019-05-15 ENCOUNTER — Other Ambulatory Visit: Payer: Self-pay

## 2019-05-15 ENCOUNTER — Inpatient Hospital Stay (HOSPITAL_COMMUNITY): Payer: 59

## 2019-05-15 ENCOUNTER — Inpatient Hospital Stay (HOSPITAL_BASED_OUTPATIENT_CLINIC_OR_DEPARTMENT_OTHER): Payer: 59 | Admitting: Hematology

## 2019-05-15 ENCOUNTER — Encounter (HOSPITAL_COMMUNITY): Payer: Self-pay | Admitting: Hematology

## 2019-05-15 VITALS — BP 145/78 | HR 58 | Temp 96.8°F | Resp 18

## 2019-05-15 DIAGNOSIS — C329 Malignant neoplasm of larynx, unspecified: Secondary | ICD-10-CM

## 2019-05-15 DIAGNOSIS — C321 Malignant neoplasm of supraglottis: Secondary | ICD-10-CM

## 2019-05-15 DIAGNOSIS — Z5111 Encounter for antineoplastic chemotherapy: Secondary | ICD-10-CM | POA: Diagnosis not present

## 2019-05-15 LAB — CBC WITH DIFFERENTIAL/PLATELET
Abs Immature Granulocytes: 0.02 10*3/uL (ref 0.00–0.07)
Basophils Absolute: 0 10*3/uL (ref 0.0–0.1)
Basophils Relative: 1 %
Eosinophils Absolute: 0.4 10*3/uL (ref 0.0–0.5)
Eosinophils Relative: 7 %
HCT: 28.6 % — ABNORMAL LOW (ref 39.0–52.0)
Hemoglobin: 9.6 g/dL — ABNORMAL LOW (ref 13.0–17.0)
Immature Granulocytes: 0 %
Lymphocytes Relative: 8 %
Lymphs Abs: 0.5 10*3/uL — ABNORMAL LOW (ref 0.7–4.0)
MCH: 31 pg (ref 26.0–34.0)
MCHC: 33.6 g/dL (ref 30.0–36.0)
MCV: 92.3 fL (ref 80.0–100.0)
Monocytes Absolute: 0.5 10*3/uL (ref 0.1–1.0)
Monocytes Relative: 8 %
Neutro Abs: 4.8 10*3/uL (ref 1.7–7.7)
Neutrophils Relative %: 76 %
Platelets: 158 10*3/uL (ref 150–400)
RBC: 3.1 MIL/uL — ABNORMAL LOW (ref 4.22–5.81)
RDW: 16.7 % — ABNORMAL HIGH (ref 11.5–15.5)
WBC: 6.3 10*3/uL (ref 4.0–10.5)
nRBC: 0 % (ref 0.0–0.2)

## 2019-05-15 LAB — COMPREHENSIVE METABOLIC PANEL
ALT: 18 U/L (ref 0–44)
AST: 20 U/L (ref 15–41)
Albumin: 3.4 g/dL — ABNORMAL LOW (ref 3.5–5.0)
Alkaline Phosphatase: 82 U/L (ref 38–126)
Anion gap: 12 (ref 5–15)
BUN: 19 mg/dL (ref 6–20)
CO2: 24 mmol/L (ref 22–32)
Calcium: 9.1 mg/dL (ref 8.9–10.3)
Chloride: 95 mmol/L — ABNORMAL LOW (ref 98–111)
Creatinine, Ser: 0.82 mg/dL (ref 0.61–1.24)
GFR calc Af Amer: >60 mL/min (ref >60)
GFR calc non Af Amer: >60 mL/min (ref >60)
Glucose, Bld: 123 mg/dL — ABNORMAL HIGH (ref 70–99)
Potassium: 3.4 mmol/L — ABNORMAL LOW (ref 3.5–5.1)
Sodium: 131 mmol/L — ABNORMAL LOW (ref 135–145)
Total Bilirubin: 0.6 mg/dL (ref 0.3–1.2)
Total Protein: 7.1 g/dL (ref 6.5–8.1)

## 2019-05-15 LAB — MAGNESIUM: Magnesium: 1.5 mg/dL — ABNORMAL LOW (ref 1.7–2.4)

## 2019-05-15 MED ORDER — SODIUM CHLORIDE 0.9% FLUSH
10.0000 mL | INTRAVENOUS | Status: DC | PRN
  Administered 2019-05-15: 10 mL

## 2019-05-15 MED ORDER — SODIUM CHLORIDE 0.9 % IV SOLN: Freq: Once | INTRAVENOUS | Status: AC

## 2019-05-15 MED ORDER — HEPARIN SOD (PORK) LOCK FLUSH 100 UNIT/ML IV SOLN
250.0000 [IU] | Freq: Once | INTRAVENOUS | Status: AC | PRN
  Administered 2019-05-15: 250 [IU]

## 2019-05-15 MED ORDER — SODIUM CHLORIDE 0.9 % IV SOLN
10.0000 mg | Freq: Once | INTRAVENOUS | Status: AC
  Administered 2019-05-15: 10 mg via INTRAVENOUS
  Filled 2019-05-15: qty 10

## 2019-05-15 MED ORDER — SODIUM CHLORIDE 0.9 % IV SOLN
150.0000 mg | Freq: Once | INTRAVENOUS | Status: AC
  Administered 2019-05-15: 150 mg via INTRAVENOUS
  Filled 2019-05-15: qty 150

## 2019-05-15 MED ORDER — POTASSIUM CHLORIDE 2 MEQ/ML IV SOLN
Freq: Once | INTRAVENOUS | Status: AC
  Filled 2019-05-15: qty 10

## 2019-05-15 MED ORDER — PALONOSETRON HCL INJECTION 0.25 MG/5ML
0.2500 mg | Freq: Once | INTRAVENOUS | Status: AC
  Administered 2019-05-15: 0.25 mg via INTRAVENOUS
  Filled 2019-05-15: qty 5

## 2019-05-15 MED ORDER — SODIUM CHLORIDE 0.9 % IV SOLN: INTRAVENOUS | Status: DC

## 2019-05-15 MED ORDER — SODIUM CHLORIDE 0.9 % IV SOLN
40.0000 mg/m2 | Freq: Once | INTRAVENOUS | Status: AC
  Administered 2019-05-15: 79 mg via INTRAVENOUS
  Filled 2019-05-15: qty 79

## 2019-05-15 NOTE — Progress Notes (Signed)
Patient has been assessed, vital signs and labs have been reviewed by Dr. Delton Coombes. ANC, Creatinine, LFTs, and Platelets are within treatment parameters per Dr. Delton Coombes. The patient is good to proceed with treatment at this time. He will need 1 liter of fluids with electrolytes tomorrow as well per Dr. Delton Coombes.

## 2019-05-15 NOTE — Patient Instructions (Signed)
[place] at [facility] Discharge Instructions  Labs drawn from PICC line today   Thank you for choosing [place] at [facility] to provide your oncology and hematology care.  To afford each patient quality time with our provider, please arrive at least 15 minutes before your scheduled appointment time.   If you have a lab appointment with the [facility] [place] please come in thru the Main Entrance and check in at the main information desk.  You need to re-schedule your appointment should you arrive 10 or more minutes late.  We strive to give you quality time with our providers, and arriving late affects you and other patients whose appointments are after yours.  Also, if you no show three or more times for appointments you may be dismissed from the clinic at the providers discretion.     Again, thank you for choosing [facility].  Our hope is that these requests will decrease the amount of time that you wait before being seen by our physicians.       _____________________________________________________________  Should you have questions after your visit to [facility], please contact our office at [phone] between the hours of 8:00 a.m. and 4:30 p.m.  Voicemails left after 4:00 p.m. will not be returned until the following business day.  For prescription refill requests, have your pharmacy contact our office and allow 72 hours.    Due to Covid, you will need to wear a mask upon entering the hospital. If you do not have a mask, a mask will be given to you at the Main Entrance upon arrival. For doctor visits, patients may have 1 support person with them. For treatment visits, patients can not have anyone with them due to social distancing guidelines and our immunocompromised population.

## 2019-05-15 NOTE — Progress Notes (Signed)
[name], [place] [id number]   CLINIC:  Medical Oncology/Hematology  PCP:  [name], [name] [address] [phone]   REASON FOR VISIT:  Follow-up for supraglottic squamous cell carcinoma.  CURRENT THERAPY: Weekly cisplatin.  BRIEF ONCOLOGIC HISTORY:  Oncology History  Malignant neoplasm of supraglottis ([facility])  01/22/2019 Initial Diagnosis   Malignant neoplasm of supraglottis ([facility])   01/30/2019 Cancer Staging   Staging form: Larynx - Supraglottis, AJCC 8th Edition - Clinical: Stage IVA (cT2, cN2c, cM0) - Signed by [name], MD on 01/30/2019   03/07/2019 -  Chemotherapy   The patient had palonosetron (ALOXI) injection 0.25 mg, 0.25 mg, Intravenous,  Once, 6 of 6 cycles Administration: 0.25 mg (03/13/2019), 0.25 mg (03/07/2019), 0.25 mg (03/27/2019), 0.25 mg (05/01/2019), 0.25 mg (05/08/2019) CISplatin (PLATINOL) 79 mg in sodium chloride 0.9 % 250 mL chemo infusion, 40 mg/m2 = 79 mg, Intravenous,  Once, 6 of 6 cycles Administration: 79 mg (03/13/2019), 79 mg (03/07/2019), 79 mg (03/27/2019), 79 mg (05/01/2019), 79 mg (05/08/2019) fosaprepitant (EMEND) 150 mg in sodium chloride 0.9 % 145 mL IVPB, 150 mg, Intravenous,  Once, 6 of 6 cycles Administration: 150 mg (03/13/2019), 150 mg (03/07/2019), 150 mg (03/27/2019), 150 mg (05/01/2019), 150 mg (05/08/2019)  for chemotherapy treatment.       CANCER STAGING: Cancer Staging Malignant neoplasm of supraglottis ([facility]) Staging form: Larynx - Supraglottis, AJCC 8th Edition - Clinical: Stage IVA (cT2, cN2c, cM0) - Signed by [name], MD on 01/30/2019    INTERVAL HISTORY:  [name] 59 y.o. male seen for follow-up of supraglottic squamous cell carcinoma.  He is here for toxicity assessment prior to week 6 of cisplatin.  He finished XRT on 05/08/2019.  Reportedly drinking about 2 cans of boost per day and taking in 4 cans of tube feeds.  He is eating ice cream.   Denies any tingling or numbness extremities.  Denies any ringing in the ears.  Chronic headaches and dizziness have been stable.  Chronic cough is also stable.  Had very mild diarrhea.  REVIEW OF SYSTEMS:  Review of Systems  Respiratory: Positive for cough.   Gastrointestinal: Positive for diarrhea.  Neurological: Positive for dizziness and headaches.  All other systems reviewed and are negative.    PAST MEDICAL/SURGICAL HISTORY:  Past Medical History:  Diagnosis Date  . Anxiety   . Arthritis    back  . Bradycardia    a. During 11/2012 adm: lopressor decreased.  . Cancer of larynx ([facility])   . Carotid artery stenosis   . 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 Coronary artery disease    a. Diag cath 10/2012 for CP/abnormal nuc -> planned PCI s/p LAD atherectomy/DES placement 11/05/12.  Marland Kitchen GERD (gastroesophageal reflux disease)   . History of hiatal hernia   . Hypercholesteremia   . Hypertension   . Stroke ([facility]) 07/31/2018   bilateral vertebrobasilar occlusion; "left side weaker thanit was before."   Past Surgical History:  Procedure Laterality Date  . CARDIAC CATHETERIZATION  10/29/2012  . CORONARY ANGIOPLASTY WITH STENT PLACEMENT  11/05/2012  . DIRECT LARYNGOSCOPY N/A 01/02/2019   Procedure: MICRO DIRECT LARYNGOSCOPY BIOPSY OF LARYNGEAL MASS;  Surgeon: [name], MD;  Location: [facility];  Service: ENT;  Laterality: N/A;  . HEMORRHOID SURGERY  ~ 2010  . INSERTION OF MESH N/A 06/02/2015   Procedure:  INSERTION OF MESH;  Surgeon: [name], MD;  Location: [facility];  Service: General;  Laterality: N/A;  . IR ANGIO INTRA EXTRACRAN SEL COM CAROTID INNOMINATE BILAT MOD SED  08/02/2018  . IR ANGIO VERTEBRAL SEL VERTEBRAL BILAT MOD SED  08/02/2018  . LAPAROSCOPIC INSERTION GASTROSTOMY TUBE N/A 02/24/2019   Procedure: LAPAROSCOPIC INSERTION GASTROSTOMY TUBE;  Surgeon: [name], MD;  Location: [facility];  Service:  General;  Laterality: N/A;  . LEFT HEART CATHETERIZATION WITH CORONARY ANGIOGRAM N/A 10/30/2012   Procedure: LEFT HEART CATHETERIZATION WITH CORONARY ANGIOGRAM;  Surgeon: [name], MD;  Location: [facility];  Service: Cardiovascular;  Laterality: N/A;  . MULTIPLE EXTRACTIONS WITH ALVEOLOPLASTY N/A 02/18/2019   Procedure: Extraction of tooth #'s 5-7, 10,11,14,20-29, 31 and 32 with alveoloplasty and maxiillary left buccal exostoses reductions;  Surgeon: [name], [name];  Location: [facility];  Service: Oral Surgery;  Laterality: N/A;  . PERCUTANEOUS CORONARY ROTOBLATOR INTERVENTION (PCI-R) N/A 11/05/2012   Procedure: PERCUTANEOUS CORONARY ROTOBLATOR INTERVENTION (PCI-R);  Surgeon: [name], MD;  Location: [facility];  Service: Cardiovascular;  Laterality: N/A;  . TRACHEOSTOMY TUBE PLACEMENT N/A 02/18/2019   Procedure: Tracheostomy;  Surgeon: [name], MD;  Location: [facility];  Service: ENT;  Laterality: N/A;  . UMBILICAL HERNIA REPAIR N/A 06/02/2015   Procedure: UMBILICAL HERNIORRHAPHY WITH MESH;  Surgeon: [name], MD;  Location[facility];  Service: General;  Laterality: N/A;     SOCIAL HISTORY:  Social History   Socioeconomic History  . Marital status: Married    Spouse name: Not on file  . Number of children: 4  . Years of education: Not on file  . Highest education level: Not on file  Occupational History  . Occupation: Disabled  Tobacco Use  . Smoking status: Former Smoker    Packs/day: 3.00    Years: 32.00    Pack years: 96.00    Types: Cigarettes    Start date: 08/29/1977    Quit date: 11/29/2006    Years since quitting: 12.4  . Smokeless tobacco: Former Systems developer    Types: Chew  . Tobacco comment: 11/05/2012 "chewed tobacco when I play ball; aien't chewed since age 2"  Substance and Sexual Activity  . Alcohol use: Yes    Alcohol/week: 12.0 standard drinks    Types: 12 Cans of beer per week    Comment: None since 02/2019   . Drug use: Yes    Types: Marijuana     Comment: denies on 04/08/19  . Sexual activity: Not Currently  Other Topics Concern  . Not on file  Social History Narrative   Patient is disabled.  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   Patient quit smoking 10 to 12 years ago.  Patient previously 3 pack/day use.   Patient currently drinking 3-4 beers per day from 12 pack a day.   Patient denies use of chew or other illicit drugs.   Social Determinants of Health   Financial Resource Strain: Low Risk   . Difficulty of Paying Living Expenses: Not hard at all  Food Insecurity: No Food Insecurity  . Worried About Charity fundraiser in the Last Year: Never true  . Ran Out of Food in the Last Year: Never true  Transportation Needs: No Transportation Needs  . Lack of Transportation (Medical): No  . Lack of Transportation (Non-Medical): No  Physical Activity: Insufficiently Active  . Days of Exercise per Week: 2 days  . Minutes  of Exercise per Session: 30 min  Stress: Stress Concern Present  . Feeling of Stress : To some extent  Social Connections: Moderately Isolated  . Frequency of Communication with Friends and Family: Once a week  . Frequency of Social Gatherings with Friends and Family: Once a week  . Attends Religious Services: Never  . Active Member of Clubs or Organizations: No  . Attends Archivist Meetings: Never  . Marital Status: Married  Human resources officer Violence: Not At Risk  . Fear of Current or Ex-Partner: No  . Emotionally Abused: No  . Physically Abused: No  . Sexually Abused: No    FAMILY HISTORY:  Family History  Problem Relation Age of Onset  . Hypertension Mother   . Heart disease Father   . Hypertension Father   . Heart disease Sister   . Hypertension Maternal Grandmother   . Hypertension Maternal Grandfather   . Hypertension Paternal Grandmother   . Hypertension Paternal Grandfather     CURRENT MEDICATIONS:  Outpatient Encounter Medications as of 05/15/2019   Medication Sig Note  . Amino Acids-Protein Hydrolys (FEEDING SUPPLEMENT, PRO-STAT SUGAR FREE 64,) LIQD Place 30 mLs into feeding tube 3 (three) times daily with meals.   Marland Kitchen amLODipine (NORVASC) 5 MG tablet Take 5 mg by mouth daily.   Marland Kitchen aspirin EC 81 MG tablet Take 81 mg by mouth daily.   Marland Kitchen atorvastatin (LIPITOR) 80 MG tablet Take 1 tablet (80 mg total) by mouth every evening. (Patient taking differently: Take 40 mg by mouth in the morning and at bedtime. )   . Catheters (ARGYLE SUCTION CATHETER 14FR) MISC 1 Device by Does not apply route daily.   . famotidine (PEPCID) 20 MG tablet Take 20 mg by mouth daily.   . ferrous sulfate 325 (65 FE) MG tablet Take 325 mg by mouth daily with breakfast.   . Nutritional Supplements (FEEDING SUPPLEMENT, OSMOLITE 1.5 CAL,) LIQD Give 2 cartons of osmolite 1.5 at 9:30am, 1:30, 5:30 and 1 carton at 9:30pm.  Flush with 40m of water before and 1269mafter each feeding. Meets 100% of patient needs. Send bolus supplies. (Patient taking differently: Place 1,000 mLs into feeding tube in the morning, at noon, in the evening, and at bedtime. Give 2 cartons of osmolite 1.5 at 9:30am, 1:30, 5:30 and 1 carton at 9:30pm.  Flush with 606mf water before and 120m53mter each feeding. Meets 100% of patient needs. Send bolus supplies.)   . Nutritional Supplements (PROSOURCE TF) LIQD Give 30ml56mimes per day via feeding tube.  Mix with 60ml 66mater.  Flush tube with 60ml o52mter, then place prosource and water mixture in tube and flush with 60ml of43mer after. (Patient taking differently: Place 30 mLs into feeding tube in the morning and at bedtime. Mix with 60ml of 39mr.  Flush tube with 60ml of w56m, then place prosource and water mixture in tube and flush with 60ml of wa1mafter.)   . omeprazole (PRILOSEC) 20 MG capsule Take 20 mg by mouth daily.    . Tracheostomy Care KIT 1 kit by Does not apply route daily.   . Water For Irrigation, Sterile (FREE WATER) SOLN Place 100 mLs  into feeding tube See admin instructions. Please Flush PEG tube with 1 ounce (30 ml) before giving to feed, and then flush with 3 ounces (90ml) after74ming to feed--- up to 4 times a day   . [DISCONTINUED] DULoxetine (CYMBALTA) 30 MG capsule Take 30 mg by mouth  daily.   . acetaminophen (TYLENOL) 325 MG tablet Take 2 tablets (650 mg total) by mouth every 6 (six) hours as needed for mild pain, fever or headache (or Fever >/= 101). (Patient not taking: Reported on 05/15/2019)   . diphenoxylate-atropine (LOMOTIL) 2.5-0.025 MG tablet Take 2 tablets by mouth 4 (four) times daily as needed.   Marland Kitchen HYDROcodone-acetaminophen (NORCO) 10-325 MG tablet Take 1 tablet by mouth every 4 (four) hours as needed. (Patient not taking: Reported on 05/15/2019)   . lidocaine (XYLOCAINE) 2 % solution Patient: Mix 1part 2% viscous lidocaine, 1part H20. Swish & swallow 35m of diluted mixture, 321m before meals and at bedtime, up to QID (Patient not taking: Reported on 05/15/2019)   . nitroGLYCERIN (NITROSTAT) 0.4 MG SL tablet Place 1 tablet (0.4 mg total) under the tongue every 5 (five) minutes as needed for chest pain (CP or SOB). (Patient not taking: Reported on 05/15/2019)   . potassium chloride 20 MEQ/15ML (10%) SOLN Place 15 mLs (20 mEq total) into feeding tube daily for 5 days. 04/08/2019: Pharmacy did not have liquid in stock and had to order; patient was supposed to pick up today (04/08/19) but unable to get due to coming into hospial  . prochlorperazine (COMPAZINE) 10 MG tablet Take 1 tablet (10 mg total) by mouth every 6 (six) hours as needed for nausea. (Patient not taking: Reported on 05/15/2019)   . traMADol (ULTRAM) 50 MG tablet Take 50-100 mg by mouth 4 (four) times daily as needed (pain.).  04/08/2019: Patient has if needed but has not had to take recently   No facility-administered encounter medications on file as of 05/15/2019.    ALLERGIES:  Allergies  Allergen Reactions  . Duloxetine Other (See Comments)    Caused  depression  . Prednisone Other (See Comments)    Chest pain     PHYSICAL EXAM:  ECOG Performance status: 1  Vitals:   05/15/19 0839  BP: 117/80  Pulse: 70  Resp: 18  Temp: (!) 97.1 F (36.2 C)  SpO2: 99%   Filed Weights   05/15/19 0839  Weight: 158 lb 8 oz (71.9 kg)    Physical Exam Vitals reviewed.  Constitutional:      Appearance: Normal appearance.  Neck:     Comments: Tracheostomy site is within normal limits. Cardiovascular:     Rate and Rhythm: Normal rate and regular rhythm.     Heart sounds: Normal heart sounds.  Pulmonary:     Effort: Pulmonary effort is normal.     Breath sounds: Normal breath sounds.  Abdominal:     General: There is no distension.     Palpations: Abdomen is soft. There is no mass.     Comments: PEG site is within normal limits.  Musculoskeletal:        General: No swelling.  Skin:    General: Skin is warm.  Neurological:     General: No focal deficit present.     Mental Status: He is alert and oriented to person, place, and time.  Psychiatric:        Mood and Affect: Mood normal.        Behavior: Behavior normal.      LABORATORY DATA:  I have reviewed the labs as listed.  CBC    Component Value Date/Time   WBC 6.3 05/15/2019 0900   RBC 3.10 (L) 05/15/2019 0900   HGB 9.6 (L) 05/15/2019 0900   HCT 28.6 (L) 05/15/2019 0900   HCT 25.0 (L) 04/02/2019 042094  PLT 158 05/15/2019 0900   MCV 92.3 05/15/2019 0900   MCH 31.0 05/15/2019 0900   MCHC 33.6 05/15/2019 0900   RDW 16.7 (H) 05/15/2019 0900   LYMPHSABS 0.5 (L) 05/15/2019 0900   MONOABS 0.5 05/15/2019 0900   EOSABS 0.4 05/15/2019 0900   BASOSABS 0.0 05/15/2019 0900   CMP Latest Ref Rng & Units 05/15/2019 05/08/2019 05/01/2019  Glucose 70 - 99 mg/dL 123(H) 100(H) 100(H)  BUN 6 - 20 mg/dL _0 Creatinine 0.61 - 1.24 mg/dL 0.82 0.76 0.80  Sodium 135 - 145 mmol/L 131(L) 131(L) 130(L)  Potassium 3.5 - 5.1 mmol/L 3.4(L) 3.8 4.0  Chloride 98 - 111 mmol/L 95(L) 95(L)  95(L)  CO2 22 - 32 mmol/L _1 Calcium 8.9 - 10.3 mg/dL 9.1 9.1 9.2  Total Protein 6.5 - 8.1 g/dL 7.1 7.7 7.9  Total Bilirubin 0.3 - 1.2 mg/dL 0.6 0.6 0.6  Alkaline Phos 38 - 126 U/L 82 94 104  AST 15 - 41 U/L _2 ALT 0 - 44 U/L 18 20 33       DIAGNOSTIC IMAGING:  I have reviewed scans.   ASSESSMENT & PLAN:   Malignant neoplasm of supraglottis ([place]) 1.  T3N2C supraglottic squamous cell carcinoma: -Laryngoscopy and biopsy on 01/02/2019, trach placed on 02/18/2019. -PET scan on 01/27/2019 showed hypermetabolic laryngeal lesion with hypermetabolic cervical metastatic disease bilaterally. -Weekly cisplatin and radiation started on 03/07/2019, many interruptions due to hospitalizations. -Week 5 of cisplatin on 05/08/2019 with radiation completed on the same day. -Denies any neuropathy, ringing in the ears.  We reviewed his labs.  Creatinine is normal with normal CBC. -He will proceed with his last weekly cisplatin today.  He will come back tomorrow for fluids.  Will discontinue PICC line tomorrow.  He will be reevaluated in 2 weeks.  We plan to schedule scans at next visit.  2.  Nutrition: -He is taking an Osmolite 1.5, 4 cans/day.  He is also drinking 2 cans of boost per day. -His weight is stable from last week.  3.  CVA: -He had pontine stroke with large vessel occlusion. -He had bleeding from trach site and Plavix was held since 03/04/2019.  He will continue aspirin 81 mg daily.  4.  Electrolyte abnormalities: -He had mild hypokalemia with potassium 3.4 and hypomagnesemia with magnesium 1.5.  He will receive electrolytes along with his IV fluids today.   Orders placed this encounter:  No orders of the defined types were placed in this encounter.   [name], MD [place] [phone]

## 2019-05-15 NOTE — Patient Instructions (Addendum)
[place] at [facility] Discharge Instructions  You were seen today by Dr. Delton Coombes. He went over your recent lab results. Last treatment today, come back for fluids tomorrow. He will see you back in 2 weeks for labs and follow up.   Thank you for choosing [place] at [facility] to provide your oncology and hematology care.  To afford each patient quality time with our provider, please arrive at least 15 minutes before your scheduled appointment time.   If you have a lab appointment with the [facility] please come in thru the  Main Entrance and check in at the main information desk  You need to re-schedule your appointment should you arrive 10 or more minutes late.  We strive to give you quality time with our providers, and arriving late affects you and other patients whose appointments are after yours.  Also, if you no show three or more times for appointments you may be dismissed from the clinic at the providers discretion.     Again, thank you for choosing [facility].  Our hope is that these requests will decrease the amount of time that you wait before being seen by our physicians.       _____________________________________________________________  Should you have questions after your visit to [facility], please contact our office at [phone] between the hours of 8:00 a.m. and 4:30 p.m.  Voicemails left after 4:00 p.m. will not be returned until the following business day.  For prescription refill requests, have your pharmacy contact our office and allow 72 hours.    Cancer Center Support Programs:   > Cancer Support Group  2nd Tuesday of the month 1pm-2pm, Journey Room

## 2019-05-15 NOTE — Patient Instructions (Signed)
[facility] Discharge Instructions for Patients Receiving Chemotherapy  Today you received the following chemotherapy agents   To help prevent nausea and vomiting after your treatment, we encourage you to take your nausea medication   If you develop nausea and vomiting that is not controlled by your nausea medication, call the clinic.   BELOW ARE SYMPTOMS THAT SHOULD BE REPORTED IMMEDIATELY:  *FEVER GREATER THAN 100.5 F  *CHILLS WITH OR WITHOUT FEVER  NAUSEA AND VOMITING THAT IS NOT CONTROLLED WITH YOUR NAUSEA MEDICATION  *UNUSUAL SHORTNESS OF BREATH  *UNUSUAL BRUISING OR BLEEDING  TENDERNESS IN MOUTH AND THROAT WITH OR WITHOUT PRESENCE OF ULCERS  *URINARY PROBLEMS  *BOWEL PROBLEMS  UNUSUAL RASH Items with * indicate a potential emergency and should be followed up as soon as possible.  Feel free to call the clinic should you have any questions or concerns. The clinic phone number is [phone].  Please show the CHEMO ALERT CARD at check-in to the Emergency Department and triage nurse.   

## 2019-05-15 NOTE — Progress Notes (Signed)
Labs reviewed with MD today. Will proceed with treatment today per MD.   Treatment given per orders. Patient tolerated it well without problems. Vitals stable and discharged home from clinic ambulatory. Follow up as scheduled.

## 2019-05-15 NOTE — Assessment & Plan Note (Signed)
1.  T3N2C supraglottic squamous cell carcinoma: -Laryngoscopy and biopsy on 01/02/2019, trach placed on 02/18/2019. -PET scan on 01/27/2019 showed hypermetabolic laryngeal lesion with hypermetabolic cervical metastatic disease bilaterally. -Weekly cisplatin and radiation started on 03/07/2019, many interruptions due to hospitalizations. -Week 5 of cisplatin on 05/08/2019 with radiation completed on the same day. -Denies any neuropathy, ringing in the ears.  We reviewed his labs.  Creatinine is normal with normal CBC. -He will proceed with his last weekly cisplatin today.  He will come back tomorrow for fluids.  Will discontinue PICC line tomorrow.  He will be reevaluated in 2 weeks.  We plan to schedule scans at next visit.  2.  Nutrition: -He is taking an Osmolite 1.5, 4 cans/day.  He is also drinking 2 cans of boost per day. -His weight is stable from last week.  3.  CVA: -He had pontine stroke with large vessel occlusion. -He had bleeding from trach site and Plavix was held since 03/04/2019.  He will continue aspirin 81 mg daily.  4.  Electrolyte abnormalities: -He had mild hypokalemia with potassium 3.4 and hypomagnesemia with magnesium 1.5.  He will receive electrolytes along with his IV fluids today.

## 2019-05-16 ENCOUNTER — Inpatient Hospital Stay (HOSPITAL_COMMUNITY): Payer: 59

## 2019-05-16 VITALS — BP 121/78 | HR 69 | Temp 97.3°F | Resp 18

## 2019-05-16 DIAGNOSIS — C760 Malignant neoplasm of head, face and neck: Secondary | ICD-10-CM

## 2019-05-16 DIAGNOSIS — E860 Dehydration: Secondary | ICD-10-CM

## 2019-05-16 DIAGNOSIS — Z5111 Encounter for antineoplastic chemotherapy: Secondary | ICD-10-CM | POA: Diagnosis not present

## 2019-05-16 MED ORDER — ACETAMINOPHEN 325 MG PO TABS
ORAL_TABLET | ORAL | Status: AC
  Filled 2019-05-16: qty 2

## 2019-05-16 MED ORDER — SODIUM CHLORIDE 0.9 % IV SOLN
Freq: Once | INTRAVENOUS | Status: AC
  Filled 2019-05-16: qty 1000

## 2019-05-16 MED ORDER — SODIUM CHLORIDE 0.9% FLUSH
10.0000 mL | Freq: Once | INTRAVENOUS | Status: AC | PRN
  Administered 2019-05-16: 10 mL

## 2019-05-16 MED ORDER — HEPARIN SOD (PORK) LOCK FLUSH 100 UNIT/ML IV SOLN: 250.0000 [IU] | Freq: Once | INTRAVENOUS | Status: DC | PRN

## 2019-05-16 NOTE — Progress Notes (Signed)
Hydration fluids given per orders. PICC line pulled per MD order. See Flow sheet for details. Procedure done per protocol.   Patient tolerated it well without problems. Vitals stable and discharged home from clinic via wheelchair. Follow up as scheduled.

## 2019-05-21 ENCOUNTER — Inpatient Hospital Stay: Payer: 59

## 2019-05-21 NOTE — Progress Notes (Signed)
Nutrition  Patient on schedule for phone f/u with nutrition.  Called patient and no answer.  Left message with call back number.  Linnet Bottari B. [name], [place], [place] Registered Dietitian [phone] (pager)

## 2019-05-22 NOTE — Progress Notes (Signed)
[name] presents today after completing radiation to the larynx on 05/08/2019  Pain issues, if any: He has developed mild headaches for the past 2 weeks. He reports they are in the front of his head (above his eyes) and sometimes cause him to have blurry vision; he denies any aura to lights. He takes OTC Tylenol which eases the pain for about 4 hours, but then the discomfort returns. He denies pain elsewhere. Using a feeding tube?: Yes, about 4 times a day. Occasionally feels nauseated after a feeding Weight changes, if any:  Wt Readings from Last 3 Encounters:  05/23/19 156 lb 12.8 oz (71.1 kg)  05/15/19 158 lb 8 oz (71.9 kg)  05/08/19 158 lb 3.2 oz (71.8 kg)   Swallowing issues, if any: Yes, occasionally gets strangled/coughs when drinking liquids Smoking or chewing tobacco? None Using fluoride trays daily? N/A--scheduled to see Dr. Teena Dunk on 06/03/19 Last ENT visit was on: Has not seen anyone since initial diagnosis Other notable issues, if any:  Nothing of note  Vitals:   05/23/19 1119  BP: 101/85  Pulse: (!) 109  Resp: 20  Temp: 97.7 F (36.5 C)  SpO2: 99%

## 2019-05-23 ENCOUNTER — Other Ambulatory Visit: Payer: Self-pay

## 2019-05-23 ENCOUNTER — Ambulatory Visit
Admission: RE | Admit: 2019-05-23 | Discharge: 2019-05-23 | Disposition: A | Discharge status: Home / Self Care | Payer: 59 | Source: Ambulatory Visit | Attending: Radiation Oncology | Admitting: Radiation Oncology

## 2019-05-23 ENCOUNTER — Encounter: Payer: Self-pay | Admitting: Radiation Oncology

## 2019-05-23 ENCOUNTER — Other Ambulatory Visit: Payer: Self-pay | Admitting: *Deleted

## 2019-05-23 VITALS — BP 101/85 | HR 109 | Temp 97.7°F | Resp 20 | Ht 72.0 in | Wt 156.8 lb

## 2019-05-23 DIAGNOSIS — C321 Malignant neoplasm of supraglottis: Secondary | ICD-10-CM | POA: Diagnosis not present

## 2019-05-23 DIAGNOSIS — Z7982 Long term (current) use of aspirin: Secondary | ICD-10-CM | POA: Diagnosis not present

## 2019-05-23 DIAGNOSIS — R51 Headache with orthostatic component, not elsewhere classified: Secondary | ICD-10-CM | POA: Insufficient documentation

## 2019-05-23 DIAGNOSIS — Z923 Personal history of irradiation: Secondary | ICD-10-CM | POA: Diagnosis not present

## 2019-05-23 DIAGNOSIS — Z79899 Other long term (current) drug therapy: Secondary | ICD-10-CM | POA: Insufficient documentation

## 2019-05-23 NOTE — Progress Notes (Signed)
Radiation Oncology         [phone] ________________________________  Name: [name] MRN: [id number]  Date: 05/23/2019  DOB: 1960-11-30  Follow-Up Visit Note In person  CC: Jorge School, MD  [name], MD  Diagnosis and Prior Radiotherapy:       ICD-10-CM   1. Malignant neoplasm of supraglottis ([place])  C32.1 NM PET Image Restag (PS) Skull Base To Thigh    CHIEF COMPLAINT:  Here for follow-up and surveillance of throat cancer  Narrative:    [name] presents today after completing radiation to the larynx on 05/08/2019  Pain issues, if any: He has developed mild headaches for the past 2 weeks. He reports they are in the front of his head (above his eyes) and sometimes cause him to have blurry vision; he denies any aura to lights. He takes OTC Tylenol which eases the pain for about 4 hours, but then the discomfort returns. He denies pain elsewhere. Using a feeding tube?: Yes, about 4 times a day. Occasionally feels nauseated after a feeding Weight changes, if any:  Wt Readings from Last 3 Encounters:  05/23/19 156 lb 12.8 oz (71.1 kg)  05/15/19 158 lb 8 oz (71.9 kg)  05/08/19 158 lb 3.2 oz (71.8 kg)   Swallowing issues, if any: Yes, occasionally gets strangled/coughs when drinking liquids Smoking or chewing tobacco? None Using fluoride trays daily? N/A--scheduled to see Dr. Teena Dunk on 06/03/19 Last ENT visit was on: Has not seen anyone since initial diagnosis    ALLERGIES:  is allergic to duloxetine and prednisone.  Meds: Current Outpatient Medications  Medication Sig Dispense Refill  . acetaminophen (TYLENOL) 325 MG tablet Take 2 tablets (650 mg total) by mouth every 6 (six) hours as needed for mild pain, fever or headache (or Fever >/= 101). 12 tablet 0  . Amino Acids-Protein Hydrolys (FEEDING SUPPLEMENT, PRO-STAT SUGAR FREE 64,) LIQD Place 30 mLs into feeding tube 3 (three) times daily with meals. 887 mL 2  . amLODipine (NORVASC) 5 MG tablet  Take 5 mg by mouth daily.    Marland Kitchen aspirin EC 81 MG tablet Take 81 mg by mouth daily.    Marland Kitchen atorvastatin (LIPITOR) 80 MG tablet Take 1 tablet (80 mg total) by mouth every evening. (Patient taking differently: Take 40 mg by mouth in the morning and at bedtime. ) 90 tablet 3  . Catheters (ARGYLE SUCTION CATHETER 14FR) MISC 1 Device by Does not apply route daily. 100 each 0  . diphenoxylate-atropine (LOMOTIL) 2.5-0.025 MG tablet Take 2 tablets by mouth 4 (four) times daily as needed.    . famotidine (PEPCID) 20 MG tablet Take 20 mg by mouth daily.    . ferrous sulfate 325 (65 FE) MG tablet Take 325 mg by mouth daily with breakfast.    . nitroGLYCERIN (NITROSTAT) 0.4 MG SL tablet Place 1 tablet (0.4 mg total) under the tongue every 5 (five) minutes as needed for chest pain (CP or SOB). 25 tablet 3  . Nutritional Supplements (FEEDING SUPPLEMENT, OSMOLITE 1.5 CAL,) LIQD Give 2 cartons of osmolite 1.5 at 9:30am, 1:30, 5:30 and 1 carton at 9:30pm.  Flush with 35m of water before and 1238mafter each feeding. Meets 100% of patient needs. Send bolus supplies. (Patient taking differently: Place 1,000 mLs into feeding tube in the morning, at noon, in the evening, and at bedtime. Give 2 cartons of osmolite 1.5 at 9:30am, 1:30, 5:30 and 1 carton at 9:30pm.  Flush with 6078mf water before and  118m after each feeding. Meets 100% of patient needs. Send bolus supplies.) 1659 mL 0  . Nutritional Supplements (PROSOURCE TF) LIQD Give 339m2 times per day via feeding tube.  Mix with 6019mf water.  Flush tube with 65m25m water, then place prosource and water mixture in tube and flush with 65ml73mwater after. (Patient taking differently: Place 30 mLs into feeding tube in the morning and at bedtime. Mix with 65ml 19mater.  Flush tube with 65ml o47mter, then place prosource and water mixture in tube and flush with 65ml of86mer after.) 60 mL 3  . omeprazole (PRILOSEC) 20 MG capsule Take 20 mg by mouth daily.     . Tracheostomy  Care KIT 1 kit by Does not apply route daily. 1 kit 0  . Water For Irrigation, Sterile (FREE WATER) SOLN Place 100 mLs into feeding tube See admin instructions. Please Flush PEG tube with 1 ounce (30 ml) before giving to feed, and then flush with 3 ounces (90ml) af76mgiving to feed--- up to 4 times a day 1000 mL 23  . HYDROcodone-acetaminophen (NORCO) 10-325 MG tablet Take 1 tablet by mouth every 4 (four) hours as needed. (Patient not taking: Reported on 05/15/2019) 50 tablet 0  . lidocaine (XYLOCAINE) 2 % solution Patient: Mix 1part 2% viscous lidocaine, 1part H20. Swish & swallow 10mL of d71med mixture, 30min befo80meals and at bedtime, up to QID (Patient not taking: Reported on 05/15/2019) 200 mL 4  . potassium chloride 20 MEQ/15ML (10%) SOLN Place 15 mLs (20 mEq total) into feeding tube daily for 5 days. 75 mL 0  . prochlorperazine (COMPAZINE) 10 MG tablet Take 1 tablet (10 mg total) by mouth every 6 (six) hours as needed for nausea. (Patient not taking: Reported on 05/15/2019) 60 tablet 2  . traMADol (ULTRAM) 50 MG tablet Take 50-100 mg by mouth 4 (four) times daily as needed (pain.).      No current facility-administered medications for this encounter.    Physical Findings: The patient is in no acute distress. Patient is alert and oriented. Wt Readings from Last 3 Encounters:  05/23/19 156 lb 12.8 oz (71.1 kg)  05/15/19 158 lb 8 oz (71.9 kg)  05/08/19 158 lb 3.2 oz (71.8 kg)    height is 6' (1.829 m) and weight is 156 lb 12.8 oz (71.1 kg). His temperature is 97.7 F (36.5 C). His blood pressure is 101/85 and his pulse is 109 (abnormal). His respiration is 20 and oxygen saturation is 99%. .  General: Alert and oriented, in no acute distress HEENT: Head is normocephalic. Extraocular movements are intact. Oropharynx is notable for no thrush, no visible tumor Neck: Neck is notable for satisfactory healing of the skin in the treatment fields with some persistent desquamation around trach-  copious mucus via trach.  Lab Findings: Lab Results  Component Value Date   WBC 6.3 05/15/2019   HGB 9.6 (L) 05/15/2019   HCT 28.6 (L) 05/15/2019   MCV 92.3 05/15/2019   PLT 158 05/15/2019    No results found for: TSH  Radiographic Findings: US EKG SITEK[place]ITE  Result Date: 04/30/2019 If Site Rite image not attached, placement could not be confirmed due to current cardiac rhythm.   Impression/Plan:    1) Head and Neck Cancer Status: Healing from radiotherapy 2) Nutritional Status: Stable PEG tube: Using  3) Risk Factors: The patient has been educated about risk factors including alcohol and tobacco abuse; they understand that avoidance of  alcohol and tobacco is important to prevent recurrences as well as other cancers  4) Swallowing: He has not been seen by a swallowing therapist for a while.  I contacted our patient navigator to look into this.  This may have fallen off track due to his hospitalization.  5) Dental: Status post full dental extractions, has follow-up in early June with dentistry   6) Thyroid function: No results found for: TSH (check annually to screen for hypothyroidism)  7)  Follow-up in in approximately 3 months with restaging PET scan.  He knows to call if he has any issues in the interim.  Nursing will help re-dress skin around his trach and discuss skin care with wife.  We discussed measures to reduce the risk of infection during the COVID-19 pandemic.  I spoke in depth with the patient and his wife about the vaccine.  I answered their questions and concerns about the vaccine.  I recommended that they receive the vaccine.  However, they would like to continue to think about it.  They know how to schedule one when they make the decision.  On date of service, in total, I spent 30 minutes on this encounter.  This includes counseling regarding the Covid vaccine, physical exam, discussing symptoms and follow-up plans, working with our patient navigator to resume  appointments with swallowing therapy and ordering follow-up imaging and appointment. The patient was seen in person.  _____________________________________   Eppie Gibson, MD

## 2019-05-23 NOTE — Patient Outreach (Signed)
[facility]) Care Management  05/23/2019  [name] [birth year] [id number]   [facility] Telephone Assessment/Screen for [facility] health high list referral  Referral Date: 05/16/19 Referral Source: [facility] health insurance high list  Referral Reason:  [facility] health insurance high list 4 IP admissions since Feb  PCP is in Network Is on APL    Insurance: [place] admission on 04/08/19 to 04/14/19 for sepsis and bacteremia IV rocephin 2 gram for 4 days 04/15/19 to 04/17/19 Then Cipro 500 mg bid for 1 week 04/18/19 to 04/25/19 - PEG (flush with 1 ounce before feeds and 3 ounces after feeds up to 4 times/day Labs every Friday for 2 weeks 04/18/19 and 04/25/19  Outreach attempt # 1 No answer. [facility] RN CM left HIPAA [facility] Portability and Accountability Act) compliant voicemail message along with CM's contact info.   Plan: [facility] RN CM sent an unsuccessful outreach letter and scheduled this patient for another call attempt within 4 business days   Le[place] Phifer L. Lavina Hamman, RN, BSN, [place] Coordinator Office number [phone] Mobile number [phone]  Main [facility] number [phone] Fax number [phone]

## 2019-05-26 ENCOUNTER — Other Ambulatory Visit: Payer: Self-pay | Admitting: *Deleted

## 2019-05-26 ENCOUNTER — Other Ambulatory Visit: Payer: Self-pay

## 2019-05-26 ENCOUNTER — Encounter: Payer: Self-pay | Admitting: Radiation Oncology

## 2019-05-26 ENCOUNTER — Encounter: Payer: Self-pay | Admitting: *Deleted

## 2019-05-26 DIAGNOSIS — C321 Malignant neoplasm of supraglottis: Secondary | ICD-10-CM

## 2019-05-26 NOTE — Patient Outreach (Addendum)
[name]) Care Management  05/26/2019  [name] 1960/02/16 [id number]  [name] Telephone Assessment/Screen for [name] high list referral  Referral Date: 05/16/19 Referral Source: [facility] [name] insurance high list  Referral Reason:  [name] insurance high list 4 IP admissions since February 2021  PCP is in Network Is on APL    Insurance: [place] admission on 04/08/19 to 04/14/19 for sepsis and bacteremia IV rocephin 2 gram for 4 days 04/15/19 to 04/17/19 Then Cipro 500 mg bid for 1 week 04/18/19 to 04/25/19 - PEG (flush with 1 ounce before feeds and 3 ounces after feeds up to 4 times/day Labs every Friday for 2 weeks 04/18/19 and 04/25/19  All Transition of care services noted to be completed by primary care MD office staff- Dr [name] medical. Transition of Care will be completed by primary care provider office who will refer to [name] care management if needed.  Outreach attempt # 2 successful to his home/mobile number Patient is able to verify HIPAA ([name] and [name]) identifiers, date of birth (DOB) and address Reviewed and addressed referral to [name] ([place]) with patient  [name] screening  He voiced understanding and reports he is doing well He reports all his inpatient admissions were related to medical concerns association with his cancer (dehydration, blood transfusions, pneumonia, trach hemorrhaging, sepsis) He reports "all of that is done now" (referring to his oncology treatments, IV home therapy and home [name] (PT)) He confirms a 05/23/19 radiation oncology MD follow up visit after completing his radiation to his larynx on 05/08/19  He denies needs for food, support at home, BP issues, DME needs, etc He confirms he has not received vaccines like flu or covid at this time  He still has his trach (since 02/18/19)and his PICC line was removed on 05/16/19   He reports he  continue to receive Osmolite feeding but is ready to "get my teeth and start eating"   He agrees to [name] follow outreaches  [name] sent [name] materials to the e-mail address listed in [name] on hypertension (high Blood pressure), hypertension (high Blood pressure); What you can do, how to care for tracheostomy, minimize weight loss  Social: Mr [name] is a 59 year old disabled male. He previously working as an Secretary/administrator in [employer] He lives at home with his wife, [name]. He is listed with 4 children He reports he is independent with all his care needs He reports his wife [name] assists in driving him to appointments. He reports also that he drives but has not driven since his last hospitalization  Conditions sepsis and bacteremia, T3N2C supraglottic squamous cell carcinoma/larynx, PET scan on 01/27/2019 showed hypermetabolic laryngeal lesion with hypermetabolic cervical metastatic disease bilaterally, -Weekly cisplatin and radiation started on 03/07/2019, Right lower lobe healthcare associated pneumonia (HCAP), Hypertension (HTN), Coronary Artery Disease (CAD), cerebrovascular accident (CVA)/stroke-07/31/2018, GI bleed, rotator cuff syndrome of right shoulder, hx of chest pain, daily use of alcohol abuse, Hyperlipidemia (HLD), bradycardia, dehydration, malnutrition of moderate degree, hx of tracheostomy, hyponatremia, hx fever. Former smoker-11/29/2006, s/p Full dental extractions, anxiety, chronic lower back pain (hx of working [employer] with injury ~ 30 years ago)   DME Osmolite 1.5, 4 cans/day tube feeding and supplies, 2 cans boost/day ordered also., BP cuff  Reading glasses   Medications He denies concerns with taking medications as prescribed, affording medications, side effects of medications and  questions about medications  Appointments scheduled for follow up on 05/28/19 with primary care provider (PCP) Dr [name] 05/28/19 Oncology  Dr [name],   05/29/19 Oncology at [facility]/Dr [name] 06/03/19 Dr [name] 06/24/19 Neurology NP [name] directives Denies need for assist with advance directives  [name] encouraged him to contact [facility] RN [name] prn PET scan restaging in 3 months   Plan: [facility] RN [name] scheduled this patient for another call attempt within 7-14 business days as agreed by the patient sent welcome letter to patient and MD barrier involvement letter to Dr [name] Pt encouraged to return a call to [facility] RN [name] prn Routed note to MD  [place] Problem One     Most Recent Value  Care Plan Problem One  Knowledge deficit of home care for HTN and larynx cancer  Role Documenting the Problem One  Care Management Telephonic Coordinator  Care Plan for Problem One  Active  [name] Long Term Goal   over the next 90 days patient will verbalize 2-3 home interventions for managment of HTN and larynx cancer during outreach  [name] Long Term Goal Start Date  05/26/19  Interventions for Problem One Long Term Goal  Reviewed [facility] referral, Completed Jorge screening, reviewed [name] program and services, support offered sent welcome letter  [name] Short Term Goal #1   over the next 30 days patient will verbalize dental resources and increase in weight during outreaches  [name] Short Term Goal #1 Start Date  05/26/19  Interventions for Short Term Goal #1  Reviewed h is progress with radiation, assessed nutritional intake & DME needs     Joelene Millin L. Lavina Hamman, RN, BSN, [place] Coordinator Office number [phone] Mobile number [phone]

## 2019-05-28 ENCOUNTER — Inpatient Hospital Stay: Payer: 59

## 2019-05-28 ENCOUNTER — Telehealth: Payer: Self-pay

## 2019-05-28 NOTE — Telephone Encounter (Signed)
Nutrition  Patient scheduled for nutrition phone f/u.  No answer.  Left message with call back number.   Jacqualyn Sedgwick B. [name], [place], [place] Registered Dietitian [phone] (pager)

## 2019-05-29 ENCOUNTER — Inpatient Hospital Stay (HOSPITAL_BASED_OUTPATIENT_CLINIC_OR_DEPARTMENT_OTHER): Payer: 59 | Admitting: Hematology

## 2019-05-29 ENCOUNTER — Other Ambulatory Visit: Payer: Self-pay

## 2019-05-29 ENCOUNTER — Inpatient Hospital Stay (HOSPITAL_COMMUNITY): Payer: 59

## 2019-05-29 VITALS — BP 93/73 | HR 91 | Temp 97.1°F | Resp 18 | Wt 156.4 lb

## 2019-05-29 DIAGNOSIS — C321 Malignant neoplasm of supraglottis: Secondary | ICD-10-CM

## 2019-05-29 DIAGNOSIS — Z5111 Encounter for antineoplastic chemotherapy: Secondary | ICD-10-CM | POA: Diagnosis not present

## 2019-05-29 DIAGNOSIS — C329 Malignant neoplasm of larynx, unspecified: Secondary | ICD-10-CM

## 2019-05-29 LAB — CBC WITH DIFFERENTIAL/PLATELET
Abs Immature Granulocytes: 0 10*3/uL (ref 0.00–0.07)
Basophils Absolute: 0 10*3/uL (ref 0.0–0.1)
Basophils Relative: 1 %
Eosinophils Absolute: 0.2 10*3/uL (ref 0.0–0.5)
Eosinophils Relative: 9 %
HCT: 26.7 % — ABNORMAL LOW (ref 39.0–52.0)
Hemoglobin: 8.8 g/dL — ABNORMAL LOW (ref 13.0–17.0)
Immature Granulocytes: 0 %
Lymphocytes Relative: 25 %
Lymphs Abs: 0.5 10*3/uL — ABNORMAL LOW (ref 0.7–4.0)
MCH: 31.7 pg (ref 26.0–34.0)
MCHC: 33 g/dL (ref 30.0–36.0)
MCV: 96 fL (ref 80.0–100.0)
Monocytes Absolute: 0.3 10*3/uL (ref 0.1–1.0)
Monocytes Relative: 13 %
Neutro Abs: 1.2 10*3/uL — ABNORMAL LOW (ref 1.7–7.7)
Neutrophils Relative %: 52 %
Platelets: 120 10*3/uL — ABNORMAL LOW (ref 150–400)
RBC: 2.78 MIL/uL — ABNORMAL LOW (ref 4.22–5.81)
RDW: 19.1 % — ABNORMAL HIGH (ref 11.5–15.5)
WBC: 2.2 10*3/uL — ABNORMAL LOW (ref 4.0–10.5)
nRBC: 0 % (ref 0.0–0.2)

## 2019-05-29 LAB — COMPREHENSIVE METABOLIC PANEL
ALT: 15 U/L (ref 0–44)
AST: 17 U/L (ref 15–41)
Albumin: 3.6 g/dL (ref 3.5–5.0)
Alkaline Phosphatase: 91 U/L (ref 38–126)
Anion gap: 13 (ref 5–15)
BUN: 17 mg/dL (ref 6–20)
CO2: 25 mmol/L (ref 22–32)
Calcium: 9.4 mg/dL (ref 8.9–10.3)
Chloride: 96 mmol/L — ABNORMAL LOW (ref 98–111)
Creatinine, Ser: 0.84 mg/dL (ref 0.61–1.24)
GFR calc Af Amer: >60 mL/min (ref >60)
GFR calc non Af Amer: >60 mL/min (ref >60)
Glucose, Bld: 111 mg/dL — ABNORMAL HIGH (ref 70–99)
Potassium: 3.6 mmol/L (ref 3.5–5.1)
Sodium: 134 mmol/L — ABNORMAL LOW (ref 135–145)
Total Bilirubin: 0.6 mg/dL (ref 0.3–1.2)
Total Protein: 6.8 g/dL (ref 6.5–8.1)

## 2019-05-29 LAB — MAGNESIUM: Magnesium: 1.8 mg/dL (ref 1.7–2.4)

## 2019-05-29 NOTE — Progress Notes (Signed)
[name], [name] [id number]   CLINIC:  Medical Oncology/Hematology  PCP:  [name], [place] / [place] [address] [phone]   REASON FOR VISIT:  Follow-up for supraglottic squamous cell carcinoma  PRIOR THERAPY:  -Prior chemo radiation therapy with cisplatin.   CURRENT THERAPY: Observation  BRIEF ONCOLOGIC HISTORY:  Oncology History  Malignant neoplasm of supraglottis ([place])  01/22/2019 Initial Diagnosis   Malignant neoplasm of supraglottis ([place])   01/30/2019 Cancer Staging   Staging form: Larynx - Supraglottis, AJCC 8th Edition - Clinical: Stage IVA (cT2, cN2c, cM0) - Signed by [name], MD on 01/30/2019   03/07/2019 -  Chemotherapy   The patient had palonosetron (ALOXI) injection 0.25 mg, 0.25 mg, Intravenous,  Once, 6 of 6 cycles Administration: 0.25 mg (03/13/2019), 0.25 mg (03/07/2019), 0.25 mg (03/27/2019), 0.25 mg (05/01/2019), 0.25 mg (05/08/2019), 0.25 mg (05/15/2019) CISplatin (PLATINOL) 79 mg in sodium chloride 0.9 % 250 mL chemo infusion, 40 mg/m2 = 79 mg, Intravenous,  Once, 6 of 6 cycles Administration: 79 mg (03/13/2019), 79 mg (03/07/2019), 79 mg (03/27/2019), 79 mg (05/01/2019), 79 mg (05/08/2019), 79 mg (05/15/2019) fosaprepitant (EMEND) 150 mg in sodium chloride 0.9 % 145 mL IVPB, 150 mg, Intravenous,  Once, 6 of 6 cycles Administration: 150 mg (03/13/2019), 150 mg (03/07/2019), 150 mg (03/27/2019), 150 mg (05/01/2019), 150 mg (05/08/2019), 150 mg (05/15/2019)  for chemotherapy treatment.      CANCER STAGING: Cancer Staging Malignant neoplasm of supraglottis [facility]) Staging form: Larynx - Supraglottis, AJCC 8th Edition - Clinical: Stage IVA (cT2, cN2c, cM0) - Signed by [name], MD on 01/30/2019   INTERVAL HISTORY:  [name], a 59 y.o. male, returns for routine follow-up of his supraglottic squamous cell carcinoma. [name] was last seen on 05/15/2019.  He followed up with Dr. Eppie Gibson on 05/23/2019  Overall, he tells me he has been feeling pretty well. He has been drinking 3-4 cans of nutritional supplementation through his feeding tube per day. He notes that boost has been making him nauseous recently. He is only drinking 1/2 a bottle of water per day. He will only drink a few sips of drink per day.   He is declining fluids today because he said that last time he got them he got sick.    REVIEW OF SYSTEMS:  Review of Systems  HENT:   Positive for trouble swallowing.   Respiratory: Positive for cough.   Gastrointestinal: Positive for diarrhea.  Genitourinary: Positive for difficulty urinating (hesitation).   Neurological: Positive for dizziness, headaches and light-headedness (with standing).  Psychiatric/Behavioral: Positive for depression. The patient is nervous/anxious.   All other systems reviewed and are negative.   PAST MEDICAL/SURGICAL HISTORY:  Past Medical History:  Diagnosis Date  . Anxiety   . Arthritis    back  . Bradycardia    a. During 11/2012 adm: lopressor decreased.  . Cancer of larynx ([place])   . Carotid artery stenosis   . 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 Coronary artery disease    a. Diag cath 10/2012 for CP/abnormal nuc -> planned PCI s/p LAD atherectomy/DES placement 11/05/12.  Marland Kitchen GERD (gastroesophageal reflux disease)   . History of hiatal hernia   . Hypercholesteremia   . Hypertension   . Stroke ([place]) 07/31/2018   bilateral vertebrobasilar occlusion; "left side weaker thanit was before."  Past Surgical History:  Procedure Laterality Date  . CARDIAC CATHETERIZATION  10/29/2012  . CORONARY ANGIOPLASTY WITH STENT PLACEMENT  11/05/2012  . DIRECT LARYNGOSCOPY N/A 01/02/2019   Procedure: MICRO DIRECT LARYNGOSCOPY BIOPSY OF LARYNGEAL MASS;  Surgeon: [name], MD;  Location: [facility];  Service: ENT;  Laterality: N/A;  . HEMORRHOID SURGERY  ~ 2010  .  INSERTION OF MESH N/A 06/02/2015   Procedure: INSERTION OF MESH;  Surgeon: [name], MD;  Location: [facility];  Service: General;  Laterality: N/A;  . IR ANGIO INTRA EXTRACRAN SEL COM CAROTID INNOMINATE BILAT MOD SED  08/02/2018  . IR ANGIO VERTEBRAL SEL VERTEBRAL BILAT MOD SED  08/02/2018  . LAPAROSCOPIC INSERTION GASTROSTOMY TUBE N/A 02/24/2019   Procedure: LAPAROSCOPIC INSERTION GASTROSTOMY TUBE;  Surgeon: [name], MD;  Location: [facility];  Service: General;  Laterality: N/A;  . LEFT HEART CATHETERIZATION WITH CORONARY ANGIOGRAM N/A 10/30/2012   Procedure: LEFT HEART CATHETERIZATION WITH CORONARY ANGIOGRAM;  Surgeon: [name], MD;  Location: [facility] CATH LAB;  Service: Cardiovascular;  Laterality: N/A;  . MULTIPLE EXTRACTIONS WITH ALVEOLOPLASTY N/A 02/18/2019   Procedure: Extraction of tooth #'s 5-7, 10,11,14,20-29, 31 and 32 with alveoloplasty and maxiillary left buccal exostoses reductions;  Surgeon: [name], [name];  Location: [facility];  Service: Oral Surgery;  Laterality: N/A;  . PERCUTANEOUS CORONARY ROTOBLATOR INTERVENTION (PCI-R) N/A 11/05/2012   Procedure: PERCUTANEOUS CORONARY ROTOBLATOR INTERVENTION (PCI-R);  Surgeon: [name], MD;  Location: [facility] CATH LAB;  Service: Cardiovascular;  Laterality: N/A;  . TRACHEOSTOMY TUBE PLACEMENT N/A 02/18/2019   Procedure: Tracheostomy;  Surgeon: [name], MD;  Location: [facility];  Service: ENT;  Laterality: N/A;  . UMBILICAL HERNIA REPAIR N/A 06/02/2015   Procedure: UMBILICAL HERNIORRHAPHY WITH MESH;  Surgeon: [name], MD;  [facility];  Service: General;  Laterality: N/A;    SOCIAL HISTORY:  Social History   Socioeconomic History  . Marital status: Married    Spouse name: [name]  . Number of children: 4  . Years of education: Not on file  . Highest education level: Not on file  Occupational History  . Occupation: Disabled    Comment: previously worked in Nurse, learning disability as an Investment banker, corporate  . Smoking  status: Former Smoker    Packs/day: 3.00    Years: 32.00    Pack years: 96.00    Types: Cigarettes    Start date: 08/29/1977    Quit date: 11/29/2006    Years since quitting: 12.5  . Smokeless tobacco: Former Systems developer    Types: Chew  . Tobacco comment: 11/05/2012 "chewed tobacco when I play ball; aien't chewed since age 72"  Substance and Sexual Activity  . Alcohol use: Yes    Alcohol/week: 12.0 standard drinks    Types: 12 Cans of beer per week    Comment: None since 02/2019   . Drug use: Yes    Types: Marijuana    Comment: denies on 04/08/19  . Sexual activity: Not Currently  Other Topics Concern  . Not on file  Social History Narrative   Patient is disabled.  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  Patient previously 3 pack/day use.   Patient currently drinking 3-4 beers per day from 12 pack a day.   Patient denies use of chew or other illicit drugs.   Social Determinants of Health   Financial  Resource Strain: Low Risk   . Difficulty of Paying Living Expenses: Not hard at all  Food Insecurity: No Food Insecurity  . Worried About Charity fundraiser in the Last Year: Never true  . Ran Out of Food in the Last Year: Never true  Transportation Needs: No Transportation Needs  . Lack of Transportation (Medical): No  . Lack of Transportation (Non-Medical): No  Physical Activity: Insufficiently Active  . Days of Exercise per Week: 2 days  . Minutes of Exercise per Session: 30 min  Stress: Stress Concern Present  . Feeling of Stress : To some extent  Social Connections: Moderately Isolated  . Frequency of Communication with Friends and Family: Once a week  . Frequency of Social Gatherings with Friends and Family: Once a week  . Attends Religious Services: Never  . Active Member of Clubs or Organizations: No  . Attends Archivist Meetings: Never  . Marital Status: Married   Human resources officer Violence: Not At Risk  . Fear of Current or Ex-Partner: No  . Emotionally Abused: No  . Physically Abused: No  . Sexually Abused: No    FAMILY HISTORY:  Family History  Problem Relation Age of Onset  . Hypertension Mother   . Heart disease Father   . Hypertension Father   . Heart disease Sister   . Hypertension Maternal Grandmother   . Hypertension Maternal Grandfather   . Hypertension Paternal Grandmother   . Hypertension Paternal Grandfather     CURRENT MEDICATIONS:  Current Outpatient Medications  Medication Sig Dispense Refill  . Amino Acids-Protein Hydrolys (FEEDING SUPPLEMENT, PRO-STAT SUGAR FREE 64,) LIQD Place 30 mLs into feeding tube 3 (three) times daily with meals. 887 mL 2  . amLODipine (NORVASC) 5 MG tablet Take 5 mg by mouth daily.    Marland Kitchen aspirin EC 81 MG tablet Take 81 mg by mouth daily.    Marland Kitchen atorvastatin (LIPITOR) 80 MG tablet Take 1 tablet (80 mg total) by mouth every evening. (Patient taking differently: Take 40 mg by mouth in the morning and at bedtime. ) 90 tablet 3  . Catheters (ARGYLE SUCTION CATHETER 14FR) MISC 1 Device by Does not apply route daily. 100 each 0  . diazepam (VALIUM) 10 MG tablet Take 10 mg by mouth 3 (three) times daily as needed.    . famotidine (PEPCID) 20 MG tablet Take 20 mg by mouth daily.    . ferrous sulfate 325 (65 FE) MG tablet Take 325 mg by mouth daily with breakfast.    . HYDROcodone-acetaminophen (NORCO) 10-325 MG tablet Take 1 tablet by mouth every 4 (four) hours as needed. 50 tablet 0  . Nutritional Supplements (FEEDING SUPPLEMENT, OSMOLITE 1.5 CAL,) LIQD Give 2 cartons of osmolite 1.5 at 9:30am, 1:30, 5:30 and 1 carton at 9:30pm.  Flush with 8m of water before and 1250mafter each feeding. Meets 100% of patient needs. Send bolus supplies. (Patient taking differently: Place 1,000 mLs into feeding tube in the morning, at noon, in the evening, and at bedtime. Give 2 cartons of osmolite 1.5 at 9:30am, 1:30, 5:30 and  1 carton at 9:30pm.  Flush with 6043mf water before and 120m55mter each feeding. Meets 100% of patient needs. Send bolus supplies.) 1659 mL 0  . Nutritional Supplements (PROSOURCE TF) LIQD Give 30ml73mimes per day via feeding tube.  Mix with 60ml 26mater.  Flush tube with 60ml o85mter, then place prosource and water mixture in tube and flush with 60ml of33m  water after. (Patient taking differently: Place 30 mLs into feeding tube in the morning and at bedtime. Mix with 68m of water.  Flush tube with 648mof water, then place prosource and water mixture in tube and flush with 6029mf water after.) 60 mL 3  . omeprazole (PRILOSEC) 20 MG capsule Take 20 mg by mouth daily.     . Tracheostomy Care KIT 1 kit by Does not apply route daily. 1 kit 0  . Water For Irrigation, Sterile (FREE WATER) SOLN Place 100 mLs into feeding tube See admin instructions. Please Flush PEG tube with 1 ounce (30 ml) before giving to feed, and then flush with 3 ounces (20m19mfter giving to feed--- up to 4 times a day 1000 mL 23  . acetaminophen (TYLENOL) 325 MG tablet Take 2 tablets (650 mg total) by mouth every 6 (six) hours as needed for mild pain, fever or headache (or Fever >/= 101). (Patient not taking: Reported on 05/29/2019) 12 tablet 0  . diphenoxylate-atropine (LOMOTIL) 2.5-0.025 MG tablet Take 2 tablets by mouth 4 (four) times daily as needed.    . lidocaine (XYLOCAINE) 2 % solution Patient: Mix 1part 2% viscous lidocaine, 1part H20. Swish & swallow 10mL80mdiluted mixture, 30min3more meals and at bedtime, up to QID (Patient not taking: Reported on 05/29/2019) 200 mL 4  . nitroGLYCERIN (NITROSTAT) 0.4 MG SL tablet Place 1 tablet (0.4 mg total) under the tongue every 5 (five) minutes as needed for chest pain (CP or SOB). (Patient not taking: Reported on 05/29/2019) 25 tablet 3  . potassium chloride 20 MEQ/15ML (10%) SOLN Place 15 mLs (20 mEq total) into feeding tube daily for 5 days. 75 mL 0  . prochlorperazine (COMPAZINE)  10 MG tablet Take 1 tablet (10 mg total) by mouth every 6 (six) hours as needed for nausea. (Patient not taking: Reported on 05/29/2019) 60 tablet 2  . traMADol (ULTRAM) 50 MG tablet Take 50-100 mg by mouth 4 (four) times daily as needed (pain.).      No current facility-administered medications for this visit.    ALLERGIES:  Allergies  Allergen Reactions  . Duloxetine Other (See Comments)    Caused depression  . Prednisone Other (See Comments)    Chest pain    PHYSICAL EXAM:  Performance status (ECOG): 1 - Symptomatic but completely ambulatory  Vitals:   05/29/19 1006  BP: 93/73  Pulse: 91  Resp: 18  Temp: (!) 97.1 F (36.2 C)  SpO2: 95%   Wt Readings from Last 3 Encounters:  05/29/19 156 lb 6.4 oz (70.9 kg)  05/23/19 156 lb 12.8 oz (71.1 kg)  05/15/19 158 lb 8 oz (71.9 kg)   Physical Exam Vitals reviewed.  Constitutional:      Appearance: Normal appearance.  HENT:     Mouth/Throat:     Mouth: No oral lesions.  Cardiovascular:     Rate and Rhythm: Normal rate and regular rhythm.     Heart sounds: Normal heart sounds.  Pulmonary:     Effort: Pulmonary effort is normal.     Breath sounds: Normal breath sounds.  Skin:    General: Skin is warm.  Neurological:     General: No focal deficit present.     Mental Status: He is alert and oriented to person, place, and time.  Psychiatric:        Mood and Affect: Mood normal.        Behavior: Behavior normal.     LABORATORY DATA:  I have reviewed the labs as listed.  CBC Latest Ref Rng & Units 05/29/2019 05/15/2019 05/08/2019  WBC 4.0 - 10.5 K/uL 2.2(L) 6.3 11.1(H)  Hemoglobin 13.0 - 17.0 g/dL 8.8(L) 9.6(L) 10.0(L)  Hematocrit 39.0 - 52.0 % 26.7(L) 28.6(L) 30.8(L)  Platelets 150 - 400 K/uL 120(L) 158 225   CMP Latest Ref Rng & Units 05/29/2019 05/15/2019 05/08/2019  Glucose 70 - 99 mg/dL 111(H) 123(H) 100(H)  BUN 6 - 20 mg/dL _0 Creatinine 0.61 - 1.24 mg/dL 0.84 0.82 0.76  Sodium 135 - 145 mmol/L 134(L) 131(L)  131(L)  Potassium 3.5 - 5.1 mmol/L 3.6 3.4(L) 3.8  Chloride 98 - 111 mmol/L 96(L) 95(L) 95(L)  CO2 22 - 32 mmol/L _1 Calcium 8.9 - 10.3 mg/dL 9.4 9.1 9.1  Total Protein 6.5 - 8.1 g/dL 6.8 7.1 7.7  Total Bilirubin 0.3 - 1.2 mg/dL 0.6 0.6 0.6  Alkaline Phos 38 - 126 U/L 91 82 94  AST 15 - 41 U/L _2 ALT 0 - 44 U/L _3 DIAGNOSTIC IMAGING:  I have independently reviewed the scans and discussed with the patient. [place] EKG SITE RITE  Result Date: 04/30/2019 If Site Rite image not attached, placement could not be confirmed due to current cardiac rhythm.    ASSESSMENT:  1.  T3N2C supraglottic squamous cell carcinoma: -Laryngoscopy and biopsy on 01/02/2019, trach placed on 02/18/2019. -PET scan on 01/27/2019 showed hypermetabolic laryngeal lesion with hypermetabolic cervical metastatic disease bilaterally. -Weekly cisplatin and radiation started on 03/07/2019, many interruptions due to hospitalizations. -XRT completed on 05/08/2019 and last weekly cisplatin on 05/15/2019.  2.  Nutrition: -He has a PEG tube.  He is using Osmolite 1.53 to 4 cans/day.   PLAN:  1.  Supraglottic squamous cell carcinoma: -He completed chemoradiation therapy on 05/15/2019. -He still feels weak.  I have reviewed his labs today.  White count is low at 2.2 and ANC of 1200.  Creatinine is 0.84. -He also complains of dizziness.  Blood pressure is 93/73.  I have recommended IV fluids.  He does not want to have them.  He was recommended to drink lots of water.  He is not drinking water as he does not like the taste.  He was encouraged to drink electrolyte supplements like Pedialyte.  2.  Nutrition: -He is taking an Osmolite 1.53 to 4 cans/day.  He was encouraged to increase it to 5 cans/day.  He is not able to drink any boost or Ensure.  3.  CVA: -He had pontine stroke with large vessel occlusion. -He had bleeding from trach site with Plavix which was held since 03/04/2019.  Continue aspirin 81 mg  daily.    Orders placed this encounter:  Orders Placed This Encounter  Procedures  . CBC with Differential/Platelet  . Comprehensive metabolic panel  . Magnesium   Total time spent is 30 minutes with more than 60% of the time spent face-to-face discussing lab results, nutritional plan, fluid intake, counseling and coordination of care.  [name], MD [facility] [phone]   I, Jacqualyn Posey, am acting as a scribe for Dr. Sanda Linger.  I, [name] MD, have reviewed the above documentation for accuracy and completeness, and I agree with the above.

## 2019-05-29 NOTE — Patient Instructions (Signed)
[place] at [facility] Discharge Instructions  You were seen today by Dr. Delton Coombes. He went over your recent results. Please increase your fluid intake at home as tolerated; Gatorade is a great option. He will see you back in 2 for labs and follow up.   Thank you for choosing [place] at [facility] to provide your oncology and hematology care.  To afford each patient quality time with our provider, please arrive at least 15 minutes before your scheduled appointment time.   If you have a lab appointment with the [facility] please come in thru the  Main Entrance and check in at the main information desk  You need to re-schedule your appointment should you arrive 10 or more minutes late.  We strive to give you quality time with our providers, and arriving late affects you and other patients whose appointments are after yours.  Also, if you no show three or more times for appointments you may be dismissed from the clinic at the providers discretion.     Again, thank you for choosing [facility].  Our hope is that these requests will decrease the amount of time that you wait before being seen by our physicians.       _____________________________________________________________  Should you have questions after your visit to [facility], please contact our office at [phone] between the hours of 8:00 a.m. and 4:30 p.m.  Voicemails left after 4:00 p.m. will not be returned until the following business day.  For prescription refill requests, have your pharmacy contact our office and allow 72 hours.    Cancer Center Support Programs:   > Cancer Support Group  2nd Tuesday of the month 1pm-2pm, Journey Room

## 2019-05-30 ENCOUNTER — Other Ambulatory Visit: Payer: Self-pay | Admitting: *Deleted

## 2019-05-30 NOTE — Patient Outreach (Signed)
[place]) Care Management  05/30/2019  [name] 1960/01/28 [id number]   [facility] multidisciplinary care discussion  [facility] Care coordination- Chart review completed, multidisciplinary template on 3/73/66 and 05/26/19 Presented to [facility] team and [facility] staff, [name] on 05/30/19 Recommendations to continue to work on nutrition status, remind him to follow up with dietitian   Plan [facility] RN CM to follow up with this patient for another call attempt within 7-14 business days as agreed by the patient   Joelene Millin L. Lavina Hamman, RN, BSN, [place] Coordinator Office number [phone] Mobile number [phone]  Main [facility] number [phone] Fax number [phone]

## 2019-06-03 ENCOUNTER — Other Ambulatory Visit: Payer: Self-pay | Admitting: *Deleted

## 2019-06-03 ENCOUNTER — Other Ambulatory Visit (HOSPITAL_COMMUNITY): Payer: 59 | Admitting: Dentistry

## 2019-06-03 NOTE — Patient Outreach (Signed)
[facility] [facility]) Care Management  06/03/2019  [name] Oct 31, 1960 [id number]   [facility] outreach for [facility] high list referral  Referral Date: 05/16/19 Referral Source: [facility] insurance high list  Referral Reason:  [facility] insurance high list 4 IP admissions since Feb  PCP is in Network Is on APL    Insurance: [place] admission on 04/08/19 to 04/14/19 for sepsis and bacteremia IV rocephin 2 gram for 4 days 04/15/19 to 04/17/19 Then Cipro 500 mg bid for 1 week 04/18/19 to 04/25/19 - PEG (flush with 1 ounce before feeds and 3 ounces after feeds up to 4 times/day Labs every Friday for 2 weeks 04/18/19 and 04/25/19  Unsuccessful outreach attempt No answer. [facility] RN CM left HIPAA [facility] Portability and Accountability Act) compliant voicemail message along with CM's contact info.   Plan: [facility] RN CM scheduled this patient for another call attempt within 7-10 business days   Jorge Vanwagoner L. Lavina Hamman, RN, BSN, [place] Coordinator Office number [phone] Mobile number [phone]  Main [facility] number [phone] Fax number [phone]

## 2019-06-03 NOTE — Progress Notes (Signed)
Oncology Nurse Navigator Documentation   Mrs. [name] called me today for her husband [name]. She questioned the SLP referral recently placed by Dr. Isidore Moos. I explained that Dr. Isidore Moos would like him to see SLP again to help prevent possible future side effects related to radiation and also assist with current swallowing difficulties mentioned by the patient during her recent follow up with her. Mr. Walthall and her are agreeable to see Genene Churn SLP at [facility] plans to call today to get an appointment set up. They know to call me if they have any further questions or concerns.   Harlow Asa RN, BSN, OCN Head & Neck Oncology Nurse [place] [facility] Phone # [phone]  Fax # [phone]

## 2019-06-09 ENCOUNTER — Other Ambulatory Visit: Payer: Self-pay

## 2019-06-09 ENCOUNTER — Encounter (HOSPITAL_COMMUNITY): Payer: Self-pay | Admitting: Dentistry

## 2019-06-09 ENCOUNTER — Ambulatory Visit (HOSPITAL_COMMUNITY): Payer: Self-pay | Admitting: Dentistry

## 2019-06-09 VITALS — BP 101/64 | HR 74 | Temp 98.7°F | Wt 155.0 lb

## 2019-06-09 DIAGNOSIS — Z923 Personal history of irradiation: Secondary | ICD-10-CM

## 2019-06-09 DIAGNOSIS — K08109 Complete loss of teeth, unspecified cause, unspecified class: Secondary | ICD-10-CM

## 2019-06-09 DIAGNOSIS — Z9221 Personal history of antineoplastic chemotherapy: Secondary | ICD-10-CM

## 2019-06-09 DIAGNOSIS — C321 Malignant neoplasm of supraglottis: Secondary | ICD-10-CM

## 2019-06-09 DIAGNOSIS — K0820 Unspecified atrophy of edentulous alveolar ridge: Secondary | ICD-10-CM

## 2019-06-09 NOTE — Progress Notes (Signed)
06/09/2019  Patient Name:   [name] Date of Birth:   10/05/1960 Medical Record Number: [id number]  COVID 19 SCREENING: The patient does not symptoms concerning for COVID-19 infection (Including fever, chills, cough, or new SHORTNESS OF BREATH).   The patient has not obtained the coronavirus vaccine.  BP 101/64 (BP Location: Right Arm)   Pulse 74   Temp 98.7 F (37.1 C)   Wt 155 lb (70.3 kg)   BMI 21.02 kg/m   [name] presents for oral examination after chemoradiation therapy. Patient has completed radiation treatments from 03/07/2019 through 05/08/2019. Patient has completed all chemotherapy.  REVIEW OF CHIEF COMPLAINTS: DRY MOUTH: Yes HARD TO SWALLOW: Yes HURT TO SWALLOW: Yes TASTE CHANGES: Taste is slowly returning. Still using feeding tube. SORES IN MOUTH: No, but has a bony spicule in the area of #20 on the buccal aspect that is tender to palpation.  But has a bony spicule in the area of #20 on the buccal aspect that is tender to palpation. TRISMUS: No trismus symptoms. WEIGHT: 155 pounds down from 170 lbs.  HOME OH REGIMEN:  BRUSHING: Not applicable.  Patient is edentulous. FLOSSING: Not applicable.  Patient is edentulous RINSING: Using Listerine rinses as needed FLUORIDE: Not applicable TRISMUS EXERCISES:  Maximum interincisal opening: 14mm    DENTAL EXAM:  Oral Hygiene:(PLAQUE): Edentulous LOCATION OF MUCOSITIS: None   DESCRIPTION OF SALIVA: Decreased.  Mild to moderate xerostomia. ANY EXPOSED BONE: None noted OTHER WATCHED AREAS: There is a bony spicule in the area of #20 on the buccal aspect that he is tender to palpation.  Patient ideally will need further alveoloplasty in this area. DX: Xerostomia, Dysgeusia, Dysphagia, Odynophagia and Bony spicule in the area of #20 on the buccal aspect.  RECOMMENDATIONS: 1. Brush tongue daily. 2. Use trismus exercises as directed. 3. Use Listerene rinses. 4. Multiple sips of water as needed. 5.  Patient to be  referred to an oral surgeon for removal of the bony spicule on the area of #20/21 prior to follow-up with a dentist of his choice for fabrication of upper and lower complete dentures after adequate healing.  Denture fabrication is to start no earlier than 08/07/2019.   [name], [name]

## 2019-06-09 NOTE — Patient Instructions (Signed)
RECOMMENDATIONS: 1. Brush tongue daily. 2. Use trismus exercises as directed. 3. Use Listerene rinses. 4. Multiple sips of water as needed. 5.  Patient to be referred to an oral surgeon for removal of the bony spicule on the area of #20/21 prior to follow-up with a dentist of his choice for fabrication of upper and lower complete dentures after adequate healing.  Denture fabrication is to start no earlier than 08/07/2019.   [name], [name]

## 2019-06-10 ENCOUNTER — Telehealth: Payer: Self-pay

## 2019-06-10 ENCOUNTER — Other Ambulatory Visit: Payer: Self-pay | Admitting: *Deleted

## 2019-06-10 NOTE — Telephone Encounter (Signed)
Contacted patient to verify for telephone visit for pre reg

## 2019-06-10 NOTE — Patient Outreach (Signed)
[facility]) Care Management  06/10/2019  EDMAR BLANKENBURG 1960/02/28 [id number]   Second unsuccessful THN outreach forBright health high listreferral  Referral Date:05/16/19 Referral Source:Bright health insurance high list Referral Reason:Bright health insurance high list4 IP admissions since Feb  PCP is in Network Is on [place] admission on 04/08/19 to 04/14/19 for sepsis and bacteremia IV rocephin 2 gram for 4 days 04/15/19 to 04/17/19 Then Cipro 500 mg bid for 1 week 04/18/19 to 04/25/19 - PEG (flush with 1 ounce before feeds and 3 ounces after feeds up to 4 times/day Labs every Friday for 2 weeks 04/18/19 and 04/25/19  Unsuccessful outreach attempt No answer. THN RN CM was not able to leave HIPAA ([place] and Accountability Act) compliant voicemail message along with CM's contact info as the voice mail box was full   Plan: [place] sent this [facility] engaged patient an unsuccessful outreach letter and  scheduled this patient for another call attempt within 7-10 business days   Tavious Griesinger L. Lavina Hamman, RN, BSN, [place] Coordinator Office number [phone] Mobile number [phone]  Main THN number [id number] Fax number [phone]

## 2019-06-11 ENCOUNTER — Inpatient Hospital Stay: Payer: 59 | Attending: Radiation Oncology

## 2019-06-11 NOTE — Progress Notes (Signed)
Nutrition  RD has left 2 previous message with no call back.   Called patient this afternoon for follow-up.  No answer or option to leave voice mail due to mailbox being full.    Called wife, listed on contacts and RD has spoken with in the past.  No answer.  Left message on wife's voicemail.  Ladelle Teodoro B. [name], [place], [place] Registered Dietitian [phone] (pager)

## 2019-06-12 ENCOUNTER — Inpatient Hospital Stay (HOSPITAL_COMMUNITY): Payer: 59 | Attending: Hematology

## 2019-06-12 ENCOUNTER — Ambulatory Visit (HOSPITAL_COMMUNITY): Payer: 59 | Admitting: Hematology

## 2019-06-13 ENCOUNTER — Telehealth (HOSPITAL_COMMUNITY): Payer: Self-pay

## 2019-06-13 NOTE — Telephone Encounter (Signed)
Nutrition  Wife called RD back regarding nutrition follow-up.   Patient is giving about 3-4 cartons of osmolite 1.5 via tube daily.  Reports that he is not eating much as scared he will choke with trach.  SLP evaluation pending. Not drinking ensure/boost shakes as thinks it will cause diarrhea.  Wife reports he used to love boost shakes.  Eating some ice cream and milkshakes. Wife prepared mashed potatoes with mixer recently and said patient would not touch them.  Wife also reports patient stays in bed and she has been trying to get him out of the house.    Has spoke with Education officer, museum in the past.    Intervention: Reviewed importance of keeping SLP appt on 6/15 for evaluation Encouraged wife to reach out to Education officer, museum, has contact information Encouraged wife to have patient increase osmolite 1.5 back to 5 times per day.   Encouraged wife to have patient try boost again, ideally needs to drink 2 per day with feeding to better meet nutritional needs.  Patient not currently meeting nutritional needs.  Likely contributing to patient feeling bad.  Wife verbalized understanding. Wife has contact information  Jada Kuhnert B. [name], [place], [place] Registered Dietitian [phone] (pager)

## 2019-06-16 ENCOUNTER — Other Ambulatory Visit: Payer: Self-pay | Admitting: *Deleted

## 2019-06-16 NOTE — Patient Outreach (Signed)
[facility]) Care Management  06/16/2019  [name] 11-26-1960 [id number]   Third unsuccessful THNoutreachforBright health high listreferral  Referral Date:05/16/19 Referral Source:Bright health insurance high list Referral Reason:Bright health insurance high list4 IP admissions since Feb  PCP is in Network Is on [place] admission on 04/08/19 to 04/14/19 for sepsis and bacteremia IV rocephin 2 gram for 4 days 04/15/19 to 04/17/19 Then Cipro 500 mg bid for 1 week 04/18/19 to 04/25/19 - PEG (flush with 1 ounce before feeds and 3 ounces after feeds up to 4 times/day Labs every Friday for 2 weeks 04/18/19 and 04/25/19  Unsuccessful outreachattempt No answer. THN RN CM was not able to leave HIPAA ([place] and Accountability Act) compliant voicemail message along with CM's contact info as the voice mail box was full   Plan: [facility] RN CM  scheduled this patient for case closure within6business days [facility] RN CM sent this [facility] engaged patient an unsuccessful outreach letter on 06/10/19 [facility] RN CM left messages for patient on 06/03/19, 06/10/19 and 06/16/19 Routed note to MD  [place]. Lavina Hamman, RN, BSN, [place] Coordinator Office number [phone] Mobile number [phone]  Main THN number [id number] Fax number [phone]

## 2019-06-17 ENCOUNTER — Ambulatory Visit (HOSPITAL_COMMUNITY): Payer: 59 | Attending: Radiation Oncology | Admitting: Speech Pathology

## 2019-06-17 ENCOUNTER — Other Ambulatory Visit: Payer: Self-pay

## 2019-06-17 ENCOUNTER — Encounter (HOSPITAL_COMMUNITY): Payer: Self-pay | Admitting: Speech Pathology

## 2019-06-17 DIAGNOSIS — R1312 Dysphagia, oropharyngeal phase: Secondary | ICD-10-CM | POA: Diagnosis not present

## 2019-06-17 NOTE — Therapy (Signed)
[place], [place], [address] Phone: [phone]   Fax:  [phone]  Speech Language Pathology Treatment  Patient Details  Name: [name] MRN: [id number] Date of Birth: Nov 24, 1960 No data recorded  Encounter Date: 06/17/2019   End of Session - 06/17/19 [place]    Visit Number 2    Number of Visits 6    Date for SLP Re-Evaluation 07/17/19    Authorization Type [employer]   no deductible, OOP $900, copay $10, 30 visit limit, auth required   SLP Start Time 1430    SLP Stop Time  1515    SLP Time Calculation (min) 45 min    Activity Tolerance Patient tolerated treatment well           Past Medical History:  Diagnosis Date  . Anxiety   . Arthritis    back  . Bradycardia    a. During 11/2012 adm: lopressor decreased.  . Cancer of larynx ([place])   . Carotid artery stenosis   . 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 Coronary artery disease    a. Diag cath 10/2012 for CP/abnormal nuc -> planned PCI s/p LAD atherectomy/DES placement 11/05/12.  [place] Kitchen GERD (gastroesophageal reflux disease)   . History of hiatal hernia   . Hypercholesteremia   . Hypertension   . Stroke ([place]) 07/31/2018   bilateral vertebrobasilar occlusion; "left side weaker thanit was before."    Past Surgical History:  Procedure Laterality Date  . CARDIAC CATHETERIZATION  10/29/2012  . CORONARY ANGIOPLASTY WITH STENT PLACEMENT  11/05/2012  . DIRECT LARYNGOSCOPY N/A 01/02/2019   Procedure: MICRO DIRECT LARYNGOSCOPY BIOPSY OF LARYNGEAL MASS;  Surgeon: [name], MD;  Location: [place] OR;  Service: ENT;  Laterality: N/A;  . HEMORRHOID SURGERY  ~ 2010  . INSERTION OF MESH N/A 06/02/2015   Procedure: INSERTION OF MESH;  Surgeon: [name], MD;  Location: [facility];  Service: General;  Laterality: N/A;  . IR ANGIO INTRA EXTRACRAN SEL COM CAROTID INNOMINATE BILAT MOD SED   08/02/2018  . IR ANGIO VERTEBRAL SEL VERTEBRAL BILAT MOD SED  08/02/2018  . LAPAROSCOPIC INSERTION GASTROSTOMY TUBE N/A 02/24/2019   Procedure: LAPAROSCOPIC INSERTION GASTROSTOMY TUBE;  Surgeon: [name], MD;  Location: [place];  Service: General;  Laterality: N/A;  . LEFT HEART CATHETERIZATION WITH CORONARY ANGIOGRAM N/A 10/30/2012   Procedure: LEFT HEART CATHETERIZATION WITH CORONARY ANGIOGRAM;  Surgeon: [name], MD;  Location: [place] CATH LAB;  Service: Cardiovascular;  Laterality: N/A;  . MULTIPLE EXTRACTIONS WITH ALVEOLOPLASTY N/A 02/18/2019   Procedure: Extraction of tooth #'s 5-7, 10,11,14,20-29, 31 and 32 with alveoloplasty and maxiillary left buccal exostoses reductions;  Surgeon: [name], [name];  Location: [place];  Service: Oral Surgery;  Laterality: N/A;  . PERCUTANEOUS CORONARY ROTOBLATOR INTERVENTION (PCI-R) N/A 11/05/2012   Procedure: PERCUTANEOUS CORONARY ROTOBLATOR INTERVENTION (PCI-R);  Surgeon: [name], MD;  Location: [facility] CATH LAB;  Service: Cardiovascular;  Laterality: N/A;  . TRACHEOSTOMY TUBE PLACEMENT N/A 02/18/2019   Procedure: Tracheostomy;  Surgeon: [name], MD;  Location: [place];  Service: ENT;  Laterality: N/A;  . UMBILICAL HERNIA REPAIR N/A 06/02/2015   Procedure: UMBILICAL HERNIORRHAPHY WITH MESH;  Surgeon: [name], MD;  Location[facility];  Service: General;  Laterality: N/A;    There were no vitals filed for this visit.   Subjective Assessment -  06/17/19 1826    Subjective "I can't eat anything. I can only drink water."    Currently in Pain? No/denies             ADULT SLP TREATMENT - 06/17/19 0001      General Information   Behavior/Cognition Alert;Cooperative;Pleasant mood    Patient Positioning Upright in chair    Oral care provided N/A    HPI Nowell [name] is a 59 yo male who was referred for a clinical swallow evaluation by Dr. 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      Treatment Provided   Treatment provided Dysphagia      Dysphagia Treatment   Temperature Spikes Noted No    Respiratory Status Room air    Oral Cavity - Dentition Missing dentition;Poor condition    Treatment Methods Skilled observation;Differential diagnosis;Patient/caregiver education    Patient observed directly with PO's Yes    Type of PO's observed Thin liquids    Feeding Able to feed self    Liquids provided via Cup    Pharyngeal Phase Signs & Symptoms Suspected delayed swallow initiation;Immediate cough;Changes in respirations    Type of cueing Verbal    Amount of cueing Minimal      Pain Assessment   Pain Assessment No/denies pain      Dysphagia Recommendations   Diet recommendations NPO   ice chip trials and needs MBS   Medication Administration Via alternative means    Supervision Patient able to self feed      General Recommendations   Oral Care Recommendations Oral care BID;Oral care prior to ice chips    Follow up Recommendations Outpatient SLP      Progression Toward Goals   Progression toward goals Progressing toward goals              SLP Short Term Goals - 06/17/19 1830      SLP SHORT TERM GOAL #1   Title Pt will demonstrate safe and efficient consumption of self regulated regular textures with thin liquids with use of strategies as needed.    Baseline Currently consuming a variety of regular textures and all liquids    Time 3    Period Months    Status New    Target Date 07/17/19      SLP SHORT TERM GOAL #2   Title Pt will complete pharyngeal swallowing exercises as assigned with use of written cue after initial introduction/model from SLP    Baseline Introduced this date    Time 3    Period Months    Status New    Target Date 07/17/19      SLP SHORT TERM GOAL #3   Title Pt will verbalize 3 signs/symptoms of aspiration pneumonia with min assist.    Baseline Introduced this date    Time 3    Period Months     Status New    Target Date 07/17/19      SLP SHORT TERM GOAL #4   Title Complete MBSS within the next 2-4 weeks    Baseline has not had objective swallow assessment    Time 4   Period Weeks    Status New    Target Date 07/17/19              Plan - 06/17/19 1830    Clinical Impression Statement Pt presents with signs and symptoms of aspiration with thin liquids characterized by immediate and  delayed coughing with expectoration of po trials. Pt still has his trach and has not had follow up for this with Dr. Benjamine Mola. Recommend continued use of tube feeds, follow up with ENT for trach management, and MBSS. Will request order to be completed following ENT visit. Pt and caregiver in agreement with plan of care. SLP will follow.    Speech Therapy Frequency --    Duration Other (comment)   3 months   Treatment/Interventions Aspiration precaution training;SLP instruction and feedback;Pharyngeal strengthening exercises;Compensatory strategies;Patient/family education;Compensatory techniques;Diet toleration management by SLP   MBSS if indicated   Potential to Achieve Goals Good    SLP Home Exercise Plan Pt will complete HEP as assigned to facilitate carryover of treatment strategies and techniques in home environment with written cues    Consulted and Agree with Plan of Care Patient           Patient will benefit from skilled therapeutic intervention in order to improve the following deficits and impairments:   Dysphagia, oropharyngeal phase    Problem List Patient Active Problem List   Diagnosis Date Noted  . HCAP (healthcare-associated pneumonia) 04/10/2019  . Febrile illness, acute 04/08/2019  . Malnutrition of moderate degree 04/03/2019  . GI bleed 04/01/2019  . Dehydration 03/28/2019  . RLL pneumonia 03/17/2019  . Hx of tracheostomy 02/18/2019  . Head and neck cancer ([place]) 02/18/2019  . Malignant neoplasm of supraglottis ([place]) 01/22/2019  . Stroke ([place]) 07/31/2018  . Rotator  cuff syndrome of right shoulder 02/20/2013  . Bradycardia 11/06/2012  . Coronary artery disease   . Abnormal finding on cardiovascular stress test 09/25/2012  . Hypertension 09/03/2012  . Chest pain 09/03/2012  . Alcohol abuse, daily use 09/03/2012  . Hyperlipidemia 09/03/2012  . Hyponatremia 09/03/2012   Thank you,  Genene Churn, [place]  [facility] 06/17/2019, 6:32 PM  [place] [address] [place], [place], [address] Phone: [phone]   Fax:  [phone]   Name: THOMA PAULSEN MRN: [id number] Date of Birth: [birth year]

## 2019-06-19 NOTE — Progress Notes (Signed)
Oncology Nurse Navigator Documentation  I called and left a voice mail with Mr. Rhetta Mura wife [name]. I informed her that Mr. Kassabian has been scheduled to see Dr. Benjamine Mola ENT on 07/03/19 at 3:10. I left my direct contact number to call me back if she has any questions or concerns.   Harlow Asa RN, BSN, OCN Head & Neck Oncology Nurse [place] at [facility] Phone # [phone]  Fax # [phone]

## 2019-06-24 ENCOUNTER — Other Ambulatory Visit: Payer: Self-pay

## 2019-06-24 ENCOUNTER — Encounter: Payer: Self-pay | Admitting: Adult Health

## 2019-06-24 ENCOUNTER — Ambulatory Visit (INDEPENDENT_AMBULATORY_CARE_PROVIDER_SITE_OTHER): Payer: 59 | Admitting: Adult Health

## 2019-06-24 VITALS — BP 115/81 | HR 80 | Ht 72.0 in | Wt 158.0 lb

## 2019-06-24 DIAGNOSIS — Z8673 Personal history of transient ischemic attack (TIA), and cerebral infarction without residual deficits: Secondary | ICD-10-CM | POA: Diagnosis not present

## 2019-06-24 DIAGNOSIS — E785 Hyperlipidemia, unspecified: Secondary | ICD-10-CM

## 2019-06-24 DIAGNOSIS — I10 Essential (primary) hypertension: Secondary | ICD-10-CM

## 2019-06-24 DIAGNOSIS — I6521 Occlusion and stenosis of right carotid artery: Secondary | ICD-10-CM | POA: Diagnosis not present

## 2019-06-24 NOTE — Patient Instructions (Addendum)
Continue aspirin 81 mg daily  and lipitor  for secondary stroke prevention  Continue to follow up with PCP regarding cholesterol and blood pressure management   Continue to follow with oncology for follow up regarding cancer   Continue to monitor blood pressure at home  Maintain strict control of hypertension with blood pressure goal below 130/90, diabetes with hemoglobin A1c goal below 6.5% and cholesterol with LDL cholesterol (bad cholesterol) goal below 70 mg/dL. I also advised the patient to eat a healthy diet with plenty of whole grains, cereals, fruits and vegetables, exercise regularly and maintain ideal body weight.  Followup in the future with me in 6 months or call earlier if needed        Thank you for coming to see [place] at [facility]. I hope we have been able to provide you high quality care today.  You may receive a patient satisfaction survey over the next few weeks. We would appreciate your feedback and comments so that we may continue to improve ourselves and the health of our patients.

## 2019-06-24 NOTE — Progress Notes (Signed)
[facility] [address] [place]. [place] [address] [phone]       OFFICE FOLLOW UP NOTE  [name] Date of Birth:  1960-05-11 Medical Record Number:  [id number]   Reason for Referral: Pontine stroke follow up    CHIEF COMPLAINT:  Chief Complaint  Patient presents with  . Follow-up    Corner rm here for a stroke f/u.    HPI:  Today, 06/24/2019, [name] returns for follow-up regarding bilateral pontine stroke in 07/2018.  He has been doing well from a stroke standpoint since prior visit without new or worsening stroke/TIA symptoms. He report occasional visual and gait impairment but this has been stable without worsening.  He currently transfers via wheelchair for long distance due to multiple hospitalizations and recently completing chemoradiation (additional information below).  Remains on aspirin 81 mg daily and atorvastatin 80 mg daily for secondary stroke prevention.  Blood pressure today 115/81.  He did follow-up with IR Dr. Estanislado Pandy with carotid ultrasound 02/2019 which showed right ICA 40 to 59% stenosis and left ICA 1 to 39% stenosis and bilateral VAs demonstrate antegrade flow and recommended follow-up in 6 months.  No neurological or stroke related concerns at this time.  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 He recently completed chemo last month and continues to follow with oncology regularly.       History provided for reference purposes Update 12/24/2018 JM: [name] is a 59 year old male who is being seen today for stroke follow-up.  Residual deficits of occasional balance difficulties and occasional visual impairment.  He has since completed PT with improvement of balance.  He does endorse ongoing left hip pain with recommendation of surgery but patient declined as there is a chance he can be paralyzed.  He recently obtained with glasses which have helped stabilize vision.  He has completed recommended 59-monthDAPT and continues on Plavix alone with mild bruising but no bleeding.  Continues on atorvastatin 80 mg daily without myalgias.  Blood pressure today 153/97.  Vascular surgery recommended repeat carotid ultrasound 6 months post discharge.  Denies new or worsening stroke/TIA symptoms.  Initial visit 09/10/2018 JM: [name] being seen today for hospital follow-up accompanied by his wife.  Residual deficits of mild blurring of vision, dizziness, balance difficulties and weakness in both legs.  He does endorse overall improvement but does continue to have difficulties with daily functioning.  Therapy was not recommended at discharge.  He has not returned to work as he was previously working as an iClinical biochemistand currently in the process of applying for SSmolandisability due to difficulties with ambulation and intolerance to heat.  He continues on aspirin 325 mg daily and clopidogrel 75 mg daily without bleeding or bruising.  Continues on atorvastatin 80 mg daily without myalgias.  Blood pressure today 166/94.  He does monitor at home and typically around 140s. He is questioning undergoing direct laryngoscopy with biopsy of laryngeal mass which is currently scheduled on 10/01/2018.  Per patient report, ongoing lymph node issues for the past 6-7 months with undergoing laryngoscopy for further  evaluation.  Ongoing alcohol use approximately 4-6 beers daily and occasional THC use  which wife reports he uses for self-medicating for anxiety or stress. Denies new or worsening stroke/TIA symptoms.  Stroke admission 07/31/2018: [name] is a 59 y.o. male with history of chronic LBP, HTN, HLD, CAD presented to [facility] ED on 07/31/2018 with blurred vision, ataxia, incoordination, and weakness in legs.  Transferred to [facility] for further evaluation/management.  MRI showed R > L pontine infarcts.  MRA showed occlusion bilateral VA's.  CTA head/neck showed bilateral V4 stenosis with occlusion distal VA and BA distally, right ICA 70% and left ICA 65% narrowing, distal left ICA 60% at cervical petrous junction, and severe ICA atherosclerosis, left CCA 40 to 50% stenosis, right VA 50% stenosis, bilateral V2 60 to 70% stenosis and left subclavian 65% stenosis.  Underwent cerebral angiogram by Dr. 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  No further interventions recommended at that time and recommended follow-up with vascular surgery outpatient.  2D echo normal EF with mild LVH and diastolic dysfunction with trivial aortic regurgitation.  LDL 101 and A1c 5.4.  UDS positive for opiates, benzos and THC.  Recommended DAPT for 3 months then Plavix alone due to intracranial stenosis.  BP as high as 244/114 during admission and recommended long-term BP goal 1 30-1 60 given severe vascular stenosis.  Recommended continuation of atorvastatin 80 mg daily.  Heavy EtOH use and substance use with cessation counseling provided.  Other stroke risk factors include former tobacco use, family history of stroke and CAD s/p DES 2014.  He was discharged home in stable condition without therapy needs as he returned to baseline.  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 Small vessel disease. Atrophy.   MRI  R>L pontine infarcts. Small vessel disease. Atrophy.   MRA   Occlusion B VAs  CTA head & neck B V4 stenoses w/ occlusion distal VA and BA distally. R ICA 70% and L ICA 65% narrowing. Distal L ICA 60% at cervical-petrous jxn. Severe ICA atherosclerosis. L CCA 40-50%. R VA 50%, B V2 60-70%. L subclavian 65%.  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   Dr. Estanislado Pandy not planning on any intervention at this time. Stroke team will sign off.    2D Echo 55-60%, mild LVH + diastolic dysfunction, trivial aortic regurgitation  LDL 101  HgbA1c 5.4  UDS positive for opiates, benzo, and THC  Lovenox 40 mg sq daily for VTE prophylaxis  aspirin 81 mg daily prior to admission, now on aspirin 325 mg daily and clopidogrel 75 mg daily folloing DAPT load. Continue DAPT for 3 months and then plavix alone  Therapy recommendations:  No OT, No SLP  Disposition: home    ROS:   All 14 systems reviewed with patient concerns of the following and all other negative blurred vision, gait impairment, fatigue    PMH:  Past Medical History:  Diagnosis Date  . Anxiety   . Arthritis    back  . Bradycardia    a. During 11/2012 adm: lopressor decreased.  . Cancer of larynx ([place])   . Carotid artery stenosis   . 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 Coronary artery disease  a. Diag cath 10/2012 for CP/abnormal nuc -> planned PCI s/p LAD atherectomy/DES placement 11/05/12.  Marland Kitchen GERD (gastroesophageal reflux disease)   . History of hiatal hernia   . Hypercholesteremia   . Hypertension   . Stroke ([place]) 07/31/2018   bilateral vertebrobasilar occlusion; "left side weaker thanit was before."    PSH:  Past Surgical History:  Procedure Laterality Date  . CARDIAC CATHETERIZATION  10/29/2012  . CORONARY ANGIOPLASTY WITH STENT PLACEMENT  11/05/2012  . DIRECT LARYNGOSCOPY N/A 01/02/2019   Procedure: MICRO DIRECT LARYNGOSCOPY BIOPSY  OF LARYNGEAL MASS;  Surgeon: [name], MD;  Location: [facility];  Service: ENT;  Laterality: N/A;  . HEMORRHOID SURGERY  ~ 2010  . INSERTION OF MESH N/A 06/02/2015   Procedure: INSERTION OF MESH;  Surgeon: [name], MD;  Location: [facility];  Service: General;  Laterality: N/A;  . IR ANGIO INTRA EXTRACRAN SEL COM CAROTID INNOMINATE BILAT MOD SED  08/02/2018  . IR ANGIO VERTEBRAL SEL VERTEBRAL BILAT MOD SED  08/02/2018  . LAPAROSCOPIC INSERTION GASTROSTOMY TUBE N/A 02/24/2019   Procedure: LAPAROSCOPIC INSERTION GASTROSTOMY TUBE;  Surgeon: [name], MD;  Location: [place];  Service: General;  Laterality: N/A;  . LEFT HEART CATHETERIZATION WITH CORONARY ANGIOGRAM N/A 10/30/2012   Procedure: LEFT HEART CATHETERIZATION WITH CORONARY ANGIOGRAM;  Surgeon: [name], MD;  Location: [facility] [facility] CATH LAB;  Service: Cardiovascular;  Laterality: N/A;  . MULTIPLE EXTRACTIONS WITH ALVEOLOPLASTY N/A 02/18/2019   Procedure: Extraction of tooth #'s 5-7, 10,11,14,20-29, 31 and 32 with alveoloplasty and maxiillary left buccal exostoses reductions;  Surgeon: [name], [name];  Location: [place];  Service: Oral Surgery;  Laterality: N/A;  . PERCUTANEOUS CORONARY ROTOBLATOR INTERVENTION (PCI-R) N/A 11/05/2012   Procedure: PERCUTANEOUS CORONARY ROTOBLATOR INTERVENTION (PCI-R);  Surgeon: [name], MD;  Location: [facility] C[facility];  Service: Cardiovascular;  Laterality: N/A;  . TRACHEOSTOMY TUBE PLACEMENT N/A 02/18/2019   Procedure: Tracheostomy;  Surgeon: [name], MD;  Location: [place];  Service: ENT;  Laterality: N/A;  . UMBILICAL HERNIA REPAIR N/A 06/02/2015   Procedure: UMBILICAL HERNIORRHAPHY WITH MESH;  Surgeon: [name], MD;  Location: [facility];  Service: General;  Laterality: N/A;    Social History:  Social History   Socioeconomic History  . Marital status: Married    Spouse name: [name]  . Number of children: 4  . Years of education: Not on file  . Highest education level: Not on file  Occupational  History  . Occupation: Disabled    Comment: previously worked in Nurse, learning disability as an Investment banker, corporate  . Smoking status: Former Smoker    Packs/day: 3.00    Years: 32.00    Pack years: 96.00    Types: Cigarettes    Start date: 08/29/1977    Quit date: 11/29/2006    Years since quitting: 12.5  . Smokeless tobacco: Former Systems developer    Types: Chew  . Tobacco comment: 11/05/2012 "chewed tobacco when I play ball; aien't chewed since age 35"  Vaping Use  . Vaping Use: Never used  Substance and Sexual Activity  . Alcohol use: Yes    Alcohol/week: 12.0 standard drinks    Types: 12 Cans of beer per week    Comment: None since 02/2019   . Drug use: Yes    Types: Marijuana    Comment: denies on 04/08/19  . Sexual activity: Not Currently  Other Topics Concern  . Not on file  Social History Narrative   Patient is  disabled.  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  Patient previously 3 pack/day use.   Patient currently drinking 3-4 beers per day from 12 pack a day.   Patient denies use of chew or other illicit drugs.   Social Determinants of Health   Financial Resource Strain: Low Risk   . Difficulty of Paying Living Expenses: Not hard at all  Food Insecurity: No Food Insecurity  . Worried About Charity fundraiser in the Last Year: Never true  . Ran Out of Food in the Last Year: Never true  Transportation Needs: No Transportation Needs  . Lack of Transportation (Medical): No  . Lack of Transportation (Non-Medical): No  Physical Activity: Insufficiently Active  . Days of Exercise per Week: 2 days  . Minutes of Exercise per Session: 30 min  Stress: Stress Concern Present  . Feeling of Stress : To some extent  Social Connections: Socially Isolated  . Frequency of Communication with Friends and Family: Once a week  . Frequency of Social Gatherings with Friends and Family: Once a  week  . Attends Religious Services: Never  . Active Member of Clubs or Organizations: No  . Attends Archivist Meetings: Never  . Marital Status: Married  Human resources officer Violence: Not At Risk  . Fear of Current or Ex-Partner: No  . Emotionally Abused: No  . Physically Abused: No  . Sexually Abused: No    Family History:  Family History  Problem Relation Age of Onset  . Hypertension Mother   . Heart disease Father   . Hypertension Father   . Heart disease Sister   . Hypertension Maternal Grandmother   . Hypertension Maternal Grandfather   . Hypertension Paternal Grandmother   . Hypertension Paternal Grandfather     Medications:   Current Outpatient Medications on File Prior to Visit  Medication Sig Dispense Refill  . acetaminophen (TYLENOL) 325 MG tablet Take 2 tablets (650 mg total) by mouth every 6 (six) hours as needed for mild pain, fever or headache (or Fever >/= 101). 12 tablet 0  . Amino Acids-Protein Hydrolys (FEEDING SUPPLEMENT, PRO-STAT SUGAR FREE 64,) LIQD Place 30 mLs into feeding tube 3 (three) times daily with meals. 887 mL 2  . amLODipine (NORVASC) 5 MG tablet Take 5 mg by mouth daily.    Marland Kitchen aspirin EC 81 MG tablet Take 81 mg by mouth daily.    Marland Kitchen atorvastatin (LIPITOR) 80 MG tablet Take 1 tablet (80 mg total) by mouth every evening. (Patient taking differently: Take 40 mg by mouth in the morning and at bedtime. ) 90 tablet 3  . Catheters (ARGYLE SUCTION CATHETER 14FR) MISC 1 Device by Does not apply route daily. 100 each 0  . diazepam (VALIUM) 10 MG tablet Take 10 mg by mouth 3 (three) times daily as needed.    . diphenoxylate-atropine (LOMOTIL) 2.5-0.025 MG tablet Take 2 tablets by mouth 4 (four) times daily as needed.    . famotidine (PEPCID) 20 MG tablet Take 20 mg by mouth daily.    . ferrous sulfate 325 (65 FE) MG tablet Take 325 mg by mouth daily with breakfast.    . HYDROcodone-acetaminophen (NORCO) 10-325 MG tablet Take 1 tablet by mouth every 4  (four) hours as needed. 50 tablet 0  . lidocaine (XYLOCAINE) 2 % solution Patient: Mix 1part 2% viscous lidocaine, 1part H20. Swish & swallow 86m  of diluted mixture, before meals and at bedtime, up to QID 200 mL 4  . nitroGLYCERIN (NITROSTAT) 0.4 MG SL tablet Place 1 tablet (0.4 mg total) under the tongue every 5 (five) minutes as needed for chest pain (CP or SOB). 25 tablet 3  . Nutritional Supplements (FEEDING SUPPLEMENT, OSMOLITE 1.5 CAL,) LIQD Give 2 cartons of osmolite 1.5 at 9:30am, 1:30, 5:30 and 1 carton at 9:30pm.  Flush with 22ml of water before and after each feeding. Meets 100% of patient needs. Send bolus supplies. (Patient taking differently: Place 1,000 mLs into feeding tube in the morning, at noon, in the evening, and at bedtime. Give 2 cartons of osmolite 1.5 at 9:30am, 1:30, 5:30 and 1 carton at 9:30pm.  Flush with 62ml of water before and after each feeding. Meets 100% of patient needs. Send bolus supplies.) 1659 mL 0  . Nutritional Supplements (PROSOURCE TF) LIQD Give 61ml 2 times per day via feeding tube.  Mix with 52ml of water.  Flush tube with 14ml of water, then place prosource and water mixture in tube and flush with 60ml of water after. (Patient taking differently: Place 30 mLs into feeding tube in the morning and at bedtime. Mix with 32ml of water.  Flush tube with 7ml of water, then place prosource and water mixture in tube and flush with 51ml of water after.) 60 mL 3  . omeprazole (PRILOSEC) 20 MG capsule Take 20 mg by mouth daily.     . prochlorperazine (COMPAZINE) 10 MG tablet Take 1 tablet (10 mg total) by mouth every 6 (six) hours as needed for nausea. 60 tablet 2  . Tracheostomy Care KIT 1 kit by Does not apply route daily. 1 kit 0  . traMADol (ULTRAM) 50 MG tablet Take 50-100 mg by mouth 4 (four) times daily as needed (pain.).     Marland Kitchen Water For Irrigation, Sterile (FREE WATER) SOLN Place 100 mLs into feeding tube See admin instructions. Please Flush PEG  tube with 1 ounce (30 ml) before giving to feed, and then flush with 3 ounces (19ml) after giving to feed--- up to 4 times a day 1000 mL 23  . potassium chloride 20 MEQ/15ML (10%) SOLN Place 15 mLs (20 mEq total) into feeding tube daily for 5 days. 75 mL 0   No current facility-administered medications on file prior to visit.    Allergies:   Allergies  Allergen Reactions  . Duloxetine Other (See Comments)    Caused depression  . Prednisone Other (See Comments)    Chest pain     Physical Exam  Vitals:   06/24/19 1238  BP: 115/81  Pulse: 80  Weight: 158 lb (71.7 kg)  Height: 6' (1.829 m)   Body mass index is 21.43 kg/m. No exam data present   General: Frail elderly-appearing Caucasian male, seated, in no evident distress Head: head normocephalic and atraumatic.   Neck: Trach in place currently uncapped Cardiovascular: regular rate and rhythm, no murmurs Musculoskeletal: no deformity Skin:  no rash/petichiae; PEG tube in place Vascular:  Normal pulses all extremities   Neurologic Exam Mental Status: Awake and fully alert.   Difficulty assessing speech due to trach.  Oriented to place and time. Recent and remote memory intact. Attention span, concentration and fund of knowledge appropriate. Mood and affect appropriate and appears to be in good spirits Cranial Nerves: Pupils equal, briskly reactive to light. Extraocular movements full without nystagmus. Visual fields full to confrontation. Hearing intact. Facial sensation intact. Face,  tongue, palate moves normally and symmetrically.  Motor: Normal bulk and tone. Normal strength in all tested extremity muscles  Sensory.: intact to touch , pinprick , position and vibratory sensation.  Coordination: Rapid alternating movements normal in all extremities. Finger-to-nose performed accurately bilaterally and heel-to-shin performed accurately bilaterally Gait and Station: Deferred as currently sitting in wheelchair Reflexes: 1+ and  symmetric. Toes downgoing.       ASSESSMENT: [name] is a 59 y.o. 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 Vascular risk factors include uncontrolled HTN, HLD, CAD, intra-and extracranial stenosis heavy EtOH use and substance abuse.  Residual deficits of intermittent blurred vision and occasional imbalance which has been stable without worsening.  Over the past 6 months, he has had multiple medical issues requiring multiple hospitalizations and has since been diagnosed with malignant neoplasm of supraglottis recently completing chemoradiation, trach and PEG tube placement and general deconditioning    PLAN:  1. Bilateral pontine infarcts:  -Overall currently stable from a neurological/stroke standpoint -Continue aspirin 81 mg daily  and atorvastatin for secondary stroke prevention.  Previously on clopidogrel but changed to aspirin after bleeding from trach.  - Maintain strict control of hypertension with blood pressure goal between 130-160, diabetes with hemoglobin A1c goal below 6.5% and cholesterol with LDL cholesterol (bad cholesterol) goal below 70 mg/dL.  I also advised the patient to eat a healthy diet with plenty of whole grains, cereals, fruits and vegetables, exercise regularly with at least 30 minutes of continuous activity daily and maintain ideal body weight. 2. HTN: Stable.  Continue to follow with PCP for monitoring and management 3. HLD: Continuation of atorvastatin and continue to follow with PCP for prescribing, monitoring and management 4. Intra-and extracranial stenosis: continue on aspirin and atorvastatin.  Blood pressure goal between 1 30-1 60 to ensure adequate perfusion.  Continue to follow with IR for ongoing monitoring and management 5. Malignant neoplasm of supraglottis: Continue to follow  with oncology  6. Hx of tobacco and EtOH use: Congratulated on complete cessation over the past 4 months and highly encouraged continued cessation    Follow up in 6 months or call earlier if needed   I spent 30 minutes of face-to-face and non-face-to-face time with patient.  This included previsit chart review, lab review, study review, order entry, electronic health record documentation, patient education   Frann Rider, [facility]  [facility] [address] [place], [place] [address]  Phone [phone] Fax [phone] Note: This document was prepared with digital dictation and possible smart phrase technology. Any transcriptional errors that result from this process are unintentional.

## 2019-06-24 NOTE — Progress Notes (Signed)
I agree with the above plan 

## 2019-06-25 ENCOUNTER — Other Ambulatory Visit: Payer: Self-pay | Admitting: *Deleted

## 2019-06-25 NOTE — Patient Outreach (Signed)
[facility]) Care Management  06/25/2019  [name] 1961-01-02 [id number]   [facility] Case closure   Call attempts made on 06/03/19, 06/10/19 and 06/16/19 Unsuccessful outreach letter sent on 06/10/19 without a response   Plan [facility] RN CM will close case after no response from patient within 10 business days. 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 Lavina Hamman, RN, BSN, [place] Coordinator Office number [phone] Mobile number [phone]  Main [facility] number [phone] Fax number [phone]

## 2019-06-30 ENCOUNTER — Other Ambulatory Visit (HOSPITAL_COMMUNITY): Payer: Self-pay | Admitting: Hematology

## 2019-06-30 DIAGNOSIS — C321 Malignant neoplasm of supraglottis: Secondary | ICD-10-CM

## 2019-07-01 ENCOUNTER — Telehealth: Payer: Self-pay

## 2019-07-01 NOTE — Telephone Encounter (Signed)
Left message to verify telephone visit for pre reg °

## 2019-07-01 NOTE — Progress Notes (Signed)
  Patient Name: [name] MRN: [id number] DOB: 1960/12/16 Referring Physician: [name] Date of Service: 05/08/2019 [facility], [place]                                                        End Of Treatment Note  Diagnoses: C32.1-Malignant neoplasm of supraglottis  Cancer Staging:  Cancer Staging Malignant neoplasm of supraglottis ([place]) Staging form: Larynx - Supraglottis, AJCC 8th Edition - Clinical: Stage IVA (cT2, cN2c, cM0) - Signed by [name], MD on 01/30/2019   Intent: Curative  Radiation Treatment Dates: 03/05/2019 through 05/08/2019  Site Technique Total Dose (Gy) Dose per Fx (Gy) Completed Fx Beam Energies  Larynx: HN_larynx IMRT 70/70 2 35/35 6X   Narrative: The patient tolerated radiation therapy relatively well. He was, however, hospitalized during treatment due to sepsis which led to a break in treatment.  Plan: The patient will follow-up with radiation oncology in 2 wks .  -----------------------------------  Eppie Gibson, MD

## 2019-07-01 NOTE — Telephone Encounter (Signed)
Left a message to verify telephone visit for pre reg

## 2019-07-02 ENCOUNTER — Inpatient Hospital Stay: Payer: 59

## 2019-07-02 NOTE — Progress Notes (Signed)
Nutrition Follow-up:  Patient with supraglottic carcinoma.  Patient has completed chemotherapy and radiation.  Last radiation treatment was on 5/6.  Spoke with wife as her number was highlighted in chart.  Wife reports patient has been taking 5 cartons of osmolite 1.5 via feeding up until several days ago. Patient was feeling better and doing more.  Reports she thinks patient "over did it with activity (driving to see father, other activities).  Over the last several days has been in the bed and only taken about 3 cartons of osmolite 1.5.  Wife reports that at times patient feels sweaty, bloated after feeding.  SLP notes reviewed and MBSS planned.  Wife reports that patient will see ENT tomorrow for follow-up.     Medications: reviewed  Labs: reviewed  Anthropometrics:   Weight 158 lb noted on 6/22 (saw neurology)    Estimated Energy Needs  Kcals: 2400-2800 Protein: 120-140 g Fluid: > 2 L  NUTRITION DIAGNOSIS: Predicted suboptimal energy intake continues   INTERVENTION:  Encouraged wife to have patient slow rate of giving carton of osmolite via tube to help with symptoms.   Encouraged giving nausea medications and reflux medications.  Wife says she has tried and patient refuses medication saying it does not help.   Wife has contact information     MONITORING, EVALUATION, GOAL: weight trends, intake, tube feeding tolerance   NEXT VISIT: July 28, phone f/u  Attikus Bartoszek B. [name], [place], [place] Registered Dietitian [phone] (pager)

## 2019-07-17 ENCOUNTER — Other Ambulatory Visit (HOSPITAL_COMMUNITY): Payer: Self-pay | Admitting: Specialist

## 2019-07-17 DIAGNOSIS — R1319 Other dysphagia: Secondary | ICD-10-CM

## 2019-07-24 ENCOUNTER — Other Ambulatory Visit: Payer: Self-pay

## 2019-07-24 ENCOUNTER — Ambulatory Visit (HOSPITAL_COMMUNITY)
Admission: RE | Admit: 2019-07-24 | Discharge: 2019-07-24 | Disposition: A | Discharge status: Home / Self Care | Payer: 59 | Source: Ambulatory Visit | Attending: Radiation Oncology | Admitting: Radiation Oncology

## 2019-07-24 ENCOUNTER — Ambulatory Visit (HOSPITAL_COMMUNITY): Payer: 59 | Attending: Radiation Oncology | Admitting: Speech Pathology

## 2019-07-24 ENCOUNTER — Encounter (HOSPITAL_COMMUNITY): Payer: Self-pay | Admitting: Speech Pathology

## 2019-07-24 DIAGNOSIS — R1319 Other dysphagia: Secondary | ICD-10-CM | POA: Insufficient documentation

## 2019-07-24 DIAGNOSIS — R1312 Dysphagia, oropharyngeal phase: Secondary | ICD-10-CM

## 2019-07-24 NOTE — Therapy (Signed)
[place], [place], [address] Phone: [phone]   Fax:  [phone]  Modified Barium Swallow  Patient Details  Name: [name] MRN: [id number] Date of Birth: 03-23-1960 No data recorded  Encounter Date: 07/24/2019   End of Session - 07/24/19 1706    Visit Number 3    Number of Visits 6    Date for SLP Re-Evaluation 08/28/19    Authorization Type [employer]   no deductible, OOP $900, copay $10, 30 visit limit, auth required   SLP Start Time 1140    SLP Stop Time  1207    SLP Time Calculation (min) 27 min    Activity Tolerance Patient tolerated treatment well           Past Medical History:  Diagnosis Date  . Anxiety   . Arthritis    back  . Bradycardia    a. During 11/2012 adm: lopressor decreased.  . Cancer of larynx ([place])   . Carotid artery stenosis   . 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 Coronary artery disease    a. Diag cath 10/2012 for CP/abnormal nuc -> planned PCI s/p LAD atherectomy/DES placement 11/05/12.  Marland Kitchen GERD (gastroesophageal reflux disease)   . History of hiatal hernia   . Hypercholesteremia   . Hypertension   . Stroke ([place]) 07/31/2018   bilateral vertebrobasilar occlusion; "left side weaker thanit was before."    Past Surgical History:  Procedure Laterality Date  . CARDIAC CATHETERIZATION  10/29/2012  . CORONARY ANGIOPLASTY WITH STENT PLACEMENT  11/05/2012  . DIRECT LARYNGOSCOPY N/A 01/02/2019   Procedure: MICRO DIRECT LARYNGOSCOPY BIOPSY OF LARYNGEAL MASS;  Surgeon: [name], MD;  Location: [facility];  Service: ENT;  Laterality: N/A;  . HEMORRHOID SURGERY  ~ 2010  . INSERTION OF MESH N/A 06/02/2015   Procedure: INSERTION OF MESH;  Surgeon: [name], MD;  Location: [facility];  Service: General;  Laterality: N/A;  . IR ANGIO INTRA EXTRACRAN SEL COM CAROTID INNOMINATE BILAT MOD SED  08/02/2018  .  IR ANGIO VERTEBRAL SEL VERTEBRAL BILAT MOD SED  08/02/2018  . LAPAROSCOPIC INSERTION GASTROSTOMY TUBE N/A 02/24/2019   Procedure: LAPAROSCOPIC INSERTION GASTROSTOMY TUBE;  Surgeon: [name], MD;  Location: [facility];  Service: General;  Laterality: N/A;  . LEFT HEART CATHETERIZATION WITH CORONARY ANGIOGRAM N/A 10/30/2012   Procedure: LEFT HEART CATHETERIZATION WITH CORONARY ANGIOGRAM;  Surgeon: [name], MD;  Location: [facility];  Service: Cardiovascular;  Laterality: N/A;  . MULTIPLE EXTRACTIONS WITH ALVEOLOPLASTY N/A 02/18/2019   Procedure: Extraction of tooth #'s 5-7, 10,11,14,20-29, 31 and 32 with alveoloplasty and maxiillary left buccal exostoses reductions;  Surgeon: [name], [name];  Location: [facility];  Service: Oral Surgery;  Laterality: N/A;  . PERCUTANEOUS CORONARY ROTOBLATOR INTERVENTION (PCI-R) N/A 11/05/2012   Procedure: PERCUTANEOUS CORONARY ROTOBLATOR INTERVENTION (PCI-R);  Surgeon: [name], MD;  Location[facility];  Service: Cardiovascular;  Laterality: N/A;  . TRACHEOSTOMY TUBE PLACEMENT N/A 02/18/2019   Procedure: Tracheostomy;  Surgeon: [name], MD;  Location: [facility];  Service: ENT;  Laterality: N/A;  . UMBILICAL HERNIA REPAIR N/A 06/02/2015   Procedure: UMBILICAL HERNIORRHAPHY WITH MESH;  Surgeon: [name], MD;  Location: [facility];  Service: General;  Laterality: N/A;    There were no vitals filed for this visit.   Subjective Assessment -  07/24/19 1657    Subjective "Just water"    Special Tests MBSS    Currently in Pain? No/denies               General - 07/24/19 1657      General Information   Date of Onset 01/02/19    HPI  [name] is a 59 yo male with  supraglottic carcinoma. Laryngoscopy and biopsy on 01/02/2019, trach placed on 02/18/2019. PET scan on 01/27/2019 showed hypermetabolic laryngeal lesion with hypermetabolic cervical metastatic disease bilaterally. Weekly cisplatin and radiation started on 03/07/2019, many interruptions  due to hospitalizations. Patient completed chemotherapy ( last weekly cisplatin on 05/15/2019) and radiation (03/07/2019 through 05/08/2019). Pt reportedly had a follow appointment with ENT, Dr. Benjamine Mola July 1, but no records found. He also has a history of a bilateral pontine stroke in 07/2018. He last saw Dr. Enrique Sack in June 2021 and a bony spicule in the area of #20 on the buccal aspect that is tender to palpation was identified and will ideally need aveoloplasty. He has a PEG and only drinks water by mouth at this time. He was referred by Dr. Eppie Gibson for MBSS.   Type of Study MBS-Modified Barium Swallow Study    Diet Prior to this Study NPO;PEG tube    Temperature Spikes Noted No    Respiratory Status Room air    History of Recent Intubation No    Behavior/Cognition Alert;Cooperative;Pleasant mood    Oral Cavity Assessment Within Functional Limits    Oral Cavity - Dentition Missing dentition;Poor condition   he reportedly saw the oral surgeon in [place], [place] yesterday with plans for extractions   Vision Functional for self feeding    Self-Feeding Abilities Able to feed self    Patient Positioning Upright in chair    Baseline Vocal Quality Normal    Volitional Cough Strong    Volitional Swallow Able to elicit    Anatomy Within functional limits    Pharyngeal Secretions Not observed secondary MBS              Oral Preparation/Oral Phase - 07/24/19 1658      Oral Preparation/Oral Phase   Oral Phase Impaired      Oral - Nectar   Oral - Nectar Teaspoon Decreased bolus cohesion      Oral - Thin   Oral - Thin Teaspoon Decreased bolus cohesion    Oral - Thin Cup Decreased bolus cohesion      Oral - Solids   Oral - Puree Decreased bolus cohesion      Electrical stimulation - Oral Phase   Was Electrical Stimulation Used No            Pharyngeal Phase - 07/24/19 1701      Pharyngeal Phase   Pharyngeal Phase Impaired      Pharyngeal - Nectar   Pharyngeal- Nectar Teaspoon  Swallow initiation at vallecula;Reduced pharyngeal peristalsis;Reduced airway/laryngeal closure;Penetration/Apiration after swallow;Moderate aspiration;Pharyngeal residue - cp segment    Pharyngeal Material enters airway, passes BELOW cords without attempt by patient to eject out (silent aspiration);Material enters airway, passes BELOW cords and not ejected out despite cough attempt by patient      Pharyngeal - Thin   Pharyngeal- Thin Teaspoon Swallow initiation at vallecula;Pharyngeal residue - cp segment    Pharyngeal- Thin Cup Swallow initiation at vallecula;Swallow initiation at pyriform sinus;Reduced pharyngeal peristalsis;Penetration/Aspiration during swallow;Penetration/Apiration after swallow;Trace aspiration;Moderate aspiration;Pharyngeal residue - cp segment    Pharyngeal Material enters airway, passes BELOW  cords and not ejected out despite cough attempt by patient;Material enters airway, passes BELOW cords without attempt by patient to eject out (silent aspiration)      Pharyngeal - Solids   Pharyngeal- Puree Swallow initiation at vallecula;Reduced pharyngeal peristalsis;Reduced epiglottic inversion;Reduced tongue base retraction;Pharyngeal residue - valleculae;Pharyngeal residue - pyriform;Penetration/Apiration after swallow    Pharyngeal Material enters airway, passes BELOW cords and not ejected out despite cough attempt by patient;Material enters airway, passes BELOW cords without attempt by patient to eject out (silent aspiration)      Electrical Stimulation - Pharyngeal Phase   Was Electrical Stimulation Used No            Cricopharyngeal Phase - 07/24/19 1704      Cervical Esophageal Phase   Cervical Esophageal Phase --   narrowing near C4-5               SLP Short Term Goals - 07/24/19 1707      SLP SHORT TERM GOAL #1   Title Pt will demonstrate safe and efficient consumption of self regulated regular textures with thin liquids with use of strategies as needed.     Baseline Currently consuming a variety of regular textures and all liquids    Time 3    Period Months    Status On-going    Target Date 08/28/19      SLP SHORT TERM GOAL #2   Title Pt will complete pharyngeal swallowing exercises as assigned with use of written cue after initial introduction/model from SLP    Baseline Introduced this date    Time 3    Period Months    Status On-going    Target Date 08/28/19      SLP SHORT TERM GOAL #3   Title Pt will verbalize 3 signs/symptoms of aspiration pneumonia with min assist.    Baseline Introduced this date    Time 3    Period Months    Status On-going    Target Date 08/28/19      SLP SHORT TERM GOAL #4   Title Complete MBSS within the next two weeks    Baseline has not had objective swallow assessment    Time 2    Period Weeks    Status Achieved    Target Date 07/17/19              Plan - 07/24/19 1706    Clinical Impression Statement Pt presents with severe pharyngeal phase dysphagia s/p radiation therapy for supraglottic cancer. Pt demonstrated seemingly normal pharyngeal swallow response with initial tsp presentations of thin barium (vallecular trigger) on two trials, however ensuing trials via cup sip thin, tsp NTL, puree, and back to tsp thin yielded penetration during and aspiration after the swallow variably sensed (cough) and unsensed. Pt with blunted/bulbous epiglottis from post radiation effects, reduced epiglottic deflection with heavier bolus (puree), reduced pharyngeal constriction, reduced laryngeal closure, narrowing near PE segment resulting in stasis of bolus near PES and pyfiroms and aspiration after the swallow from residuals spilling into larynx across consistencies. Question whether the narrowing in pharynx near C4-5 is also due to radiation changes (may benefit from dilation?). Recommend primary source of nutrition via PEG due to high risk for aspiration and continued dysphagia therapy with a focus on Pt taking  single tsp presentations of thin water after oral care. Pt should try to wear his PMSV during all waking hours. SLP will try to get records from his most recent ENT appointment.    Speech  Therapy Frequency 1x /week   1-2x per month after chemoradiation   Duration 4 weeks   3 months   Treatment/Interventions Aspiration precaution training;SLP instruction and feedback;Pharyngeal strengthening exercises;Compensatory strategies;Patient/family education;Compensatory techniques;Diet toleration management by SLP   MBSS if indicated   Potential to Achieve Goals Good    SLP Home Exercise Plan Pt will complete HEP as assigned to facilitate carryover of treatment strategies and techniques in home environment with written cues    Consulted and Agree with Plan of Care Patient           Patient will benefit from skilled therapeutic intervention in order to improve the following deficits and impairments:   Dysphagia, oropharyngeal phase     Recommendations/Treatment - 07/24/19 1704      Swallow Evaluation Recommendations   Recommended Consults Consider ENT evaluation    SLP Diet Recommendations NPO   Mare Ferrari free water protocol   Liquid Administration via Spoon    Medication Administration Via alternative means    Supervision Patient able to self feed    Compensations Small sips/bites;Multiple dry swallows after each bite/sip;Hard cough after swallow    Postural Changes Seated upright at 90 degrees;Remain upright for at least 30 minutes after feeds/meals            Prognosis - 07/24/19 1705      Prognosis   Prognosis for Safe Diet Advancement Guarded    Barriers to Reach Goals Severity of deficits      Individuals Consulted   Consulted and Agree with Results and Recommendations Patient    Report Sent to  Referring physician           Problem List Patient Active Problem List   Diagnosis Date Noted  . HCAP (healthcare-associated pneumonia) 04/10/2019  . Febrile illness, acute  04/08/2019  . Malnutrition of moderate degree 04/03/2019  . GI bleed 04/01/2019  . Dehydration 03/28/2019  . RLL pneumonia 03/17/2019  . Hx of tracheostomy 02/18/2019  . Head and neck cancer ([place]) 02/18/2019  . Malignant neoplasm of supraglottis ([place]) 01/22/2019  . Stroke ([place]) 07/31/2018  . Rotator cuff syndrome of right shoulder 02/20/2013  . Bradycardia 11/06/2012  . Coronary artery disease   . Abnormal finding on cardiovascular stress test 09/25/2012  . Hypertension 09/03/2012  . Chest pain 09/03/2012  . Alcohol abuse, daily use 09/03/2012  . Hyperlipidemia 09/03/2012  . Hyponatremia 09/03/2012   Thank you,  Genene Churn, [place]  Genene Churn 07/24/2019, 5:08 PM  [facility] [address] [place], [place], [address] Phone: [phone]   Fax:  [phone]  Name: [name] MRN: [id number] Date of Birth: January 19, 1960

## 2019-07-25 ENCOUNTER — Other Ambulatory Visit (HOSPITAL_COMMUNITY): Payer: Self-pay | Admitting: Otolaryngology

## 2019-07-25 DIAGNOSIS — R221 Localized swelling, mass and lump, neck: Secondary | ICD-10-CM

## 2019-07-29 ENCOUNTER — Telehealth: Payer: Self-pay

## 2019-07-29 NOTE — Telephone Encounter (Signed)
Left voicemail message for phone visit for pre reg

## 2019-07-30 ENCOUNTER — Inpatient Hospital Stay: Payer: 59 | Attending: Radiation Oncology

## 2019-07-30 NOTE — Progress Notes (Signed)
Nutrition Follow-up:  Patient with supraglottic carcinoma.  Patient has completed chemotherapy and radiation.  Last radiation treatment was on 5/6.  Spoke with patient's wife today.  Reports patient has good days and bad days. Reports on good days he over does it so he has to rest for 2-3 days after.  Reports patient is giving 6 cartons of osmolite 1.5 about 3 days per week and other days 3-4 cartons (when he does not feel good).  Reports reflux and nausea are better. Noted seen by SLP.      Medications: reviewed  Labs: no new  Anthropometrics:   Weight today on home scale 151 lb per wife.    Last weight in Epic 158 lb on 6/22 (saw neurology)  155 lb (05/2019)   Estimated Energy Needs  Kcals: 0867-6195 Protein: 120-140 g Fluid: > 2.4 L  NUTRITION DIAGNOSIS: Predicted suboptimal energy intake continues   INTERVENTION:  Stressed importance of patient giving 6-7 cartons of osmolite 1.5 EVERY DAY of the week via tube as patient is loosing weight.  Must continue prostat (40ml) daily for added protein. Wife verbalized understanding.  Wife has contact information      MONITORING, EVALUATION, GOAL: weight trends, intake, tube feeding tolerance   NEXT VISIT: August 17 after MD appt  Kalkidan Caudell B. [name], [place], [place] Registered Dietitian [phone] (mobile)

## 2019-08-08 ENCOUNTER — Emergency Department (HOSPITAL_COMMUNITY)
Admission: EM | Admit: 2019-08-08 | Discharge: 2019-08-08 | Disposition: A | Discharge status: Home / Self Care | Payer: 59 | Source: Home / Self Care | Attending: Emergency Medicine | Admitting: Emergency Medicine

## 2019-08-08 ENCOUNTER — Emergency Department (HOSPITAL_COMMUNITY): Payer: 59

## 2019-08-08 ENCOUNTER — Encounter (HOSPITAL_COMMUNITY): Payer: Self-pay | Admitting: Emergency Medicine

## 2019-08-08 ENCOUNTER — Other Ambulatory Visit: Payer: Self-pay

## 2019-08-08 DIAGNOSIS — I2510 Atherosclerotic heart disease of native coronary artery without angina pectoris: Secondary | ICD-10-CM | POA: Insufficient documentation

## 2019-08-08 DIAGNOSIS — Z431 Encounter for attention to gastrostomy: Secondary | ICD-10-CM | POA: Diagnosis not present

## 2019-08-08 DIAGNOSIS — F1210 Cannabis abuse, uncomplicated: Secondary | ICD-10-CM | POA: Insufficient documentation

## 2019-08-08 DIAGNOSIS — Z931 Gastrostomy status: Secondary | ICD-10-CM

## 2019-08-08 DIAGNOSIS — Z79899 Other long term (current) drug therapy: Secondary | ICD-10-CM | POA: Insufficient documentation

## 2019-08-08 DIAGNOSIS — I10 Essential (primary) hypertension: Secondary | ICD-10-CM | POA: Insufficient documentation

## 2019-08-08 DIAGNOSIS — Z182 Retained plastic fragments: Secondary | ICD-10-CM | POA: Insufficient documentation

## 2019-08-08 DIAGNOSIS — Z87891 Personal history of nicotine dependence: Secondary | ICD-10-CM | POA: Insufficient documentation

## 2019-08-08 DIAGNOSIS — Z7982 Long term (current) use of aspirin: Secondary | ICD-10-CM | POA: Insufficient documentation

## 2019-08-08 DIAGNOSIS — K9423 Gastrostomy malfunction: Secondary | ICD-10-CM | POA: Diagnosis not present

## 2019-08-08 DIAGNOSIS — T85528A Displacement of other gastrointestinal prosthetic devices, implants and grafts, initial encounter: Secondary | ICD-10-CM | POA: Insufficient documentation

## 2019-08-08 MED ORDER — IOHEXOL 300 MG/ML  SOLN
30.0000 mL | Freq: Once | INTRAMUSCULAR | Status: AC | PRN
  Administered 2019-08-08: 30 mL

## 2019-08-08 NOTE — ED Provider Notes (Signed)
Medical screening examination/treatment/procedure(s) were conducted as a shared visit with non-physician practitioner(s) and myself.  I personally evaluated the patient during the encounter.      Patient seen by me along with physician assistant.  Patient's had a gastrostomy tube in place for 4 months.  But That seals the feeding tube portion of it broke off inside the tube.  Patient is able to take some liquids.  Will get food supplements through the feeding tube.  Old tube removed.  After balloon was deflated and new feeding tube was replaced.  Patient tolerated the procedure well.  Patient will have Gastrografin study.  I assisted the physician assistant with the procedure.  Full procedure note as per physician assistant.   Fredia Sorrow, MD 08/08/19 [phone]

## 2019-08-08 NOTE — ED Notes (Signed)
peg tube replaced by Dr Rogene Houston and PA [facility]  Rad in to confirm placement   Awaiting read

## 2019-08-08 NOTE — Discharge Instructions (Addendum)
At this time there does not appear to be the presence of an emergent medical condition, however there is always the potential for conditions to change. Please read and follow the below instructions.  Please return to the Emergency Department immediately for any new or worsening symptoms. Please be sure to follow up with your Primary Care Provider within one week regarding your visit today; please call their office to schedule an appointment even if you are feeling better for a follow-up visit.  Get help right away if: You develop bleeding or a lot of discharge around the tube. You have severe pain in the abdomen. Your new tube is not working properly. You are unable to get feedings into the tube. Your tube comes out for any reason. You have any new/concerning or worsening of symptoms   Please read the additional information packets attached to your discharge summary.  Do not take your medicine if  develop an itchy rash, swelling in your mouth or lips, or difficulty breathing; call 911 and seek immediate emergency medical attention if this occurs.  You may review your lab tests and imaging results in their entirety on your MyChart account.  Please discuss all results of fully with your primary care provider and other specialist at your follow-up visit.  Note: Portions of this text may have been transcribed using voice recognition software. Every effort was made to ensure accuracy; however, inadvertent computerized transcription errors may still be present.

## 2019-08-08 NOTE — ED Provider Notes (Signed)
[facility] EMERGENCY DEPARTMENT Provider Note   CSN: [id number] Arrival date & time: 08/08/19  1548     History No chief complaint on file.   [name] is a 59 y.o. male history of laryngeal cancer, CAD, GERD, hypertension, hyperlipidemia, CVA.  Patient presents today for concern of dysfunction G-tube.  Patient reports that the plastic tip of the cap of his G-tube became dislodged and fell into the G-tube and is now stuck just past the opening.  This happened just prior to arrival.  He has no complaints or concerns otherwise.  Denies fever/chills, chest pain, cough, nausea, abdominal pain, vomiting, diarrhea or any additional concerns.  HPI     Past Medical History:  Diagnosis Date  . Anxiety   . Arthritis    back  . Bradycardia    a. During 11/2012 adm: lopressor decreased.  . Cancer of larynx ([place])   . Carotid artery stenosis   . 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 Coronary artery disease    a. Diag cath 10/2012 for CP/abnormal nuc -> planned PCI s/p LAD atherectomy/DES placement 11/05/12.  Marland Kitchen GERD (gastroesophageal reflux disease)   . History of hiatal hernia   . Hypercholesteremia   . Hypertension   . Stroke ([place]) 07/31/2018   bilateral vertebrobasilar occlusion; "left side weaker thanit was before."    Patient Active Problem List   Diagnosis Date Noted  . HCAP (healthcare-associated pneumonia) 04/10/2019  . Febrile illness, acute 04/08/2019  . Malnutrition of moderate degree 04/03/2019  . GI bleed 04/01/2019  . Dehydration 03/28/2019  . RLL pneumonia 03/17/2019  . Hx of tracheostomy 02/18/2019  . Head and neck cancer ([place]) 02/18/2019  . 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 Stroke ([place]) 07/31/2018  . Rotator cuff syndrome of right shoulder 02/20/2013  . Bradycardia 11/06/2012  . Coronary artery disease   . Abnormal finding on cardiovascular stress test  09/25/2012  . Hypertension 09/03/2012  . Chest pain 09/03/2012  . Alcohol abuse, daily use 09/03/2012  . Hyperlipidemia 09/03/2012  . Hyponatremia 09/03/2012    Past Surgical History:  Procedure Laterality Date  . CARDIAC CATHETERIZATION  10/29/2012  . CORONARY ANGIOPLASTY WITH STENT PLACEMENT  11/05/2012  . DIRECT LARYNGOSCOPY N/A 01/02/2019   Procedure: MICRO DIRECT LARYNGOSCOPY BIOPSY OF LARYNGEAL MASS;  Surgeon: [name], MD;  Location: [facility];  Service: ENT;  Laterality: N/A;  . HEMORRHOID SURGERY  ~ 2010  . INSERTION OF MESH N/A 06/02/2015   Procedure: INSERTION OF MESH;  Surgeon: [name], MD;  Location: [facility];  Service: General;  Laterality: N/A;  . IR ANGIO INTRA EXTRACRAN SEL COM CAROTID INNOMINATE BILAT MOD SED  08/02/2018  . IR ANGIO VERTEBRAL SEL VERTEBRAL BILAT MOD SED  08/02/2018  . LAPAROSCOPIC INSERTION GASTROSTOMY TUBE N/A 02/24/2019   Procedure: LAPAROSCOPIC INSERTION GASTROSTOMY TUBE;  Surgeon: [name], MD;  Location: [facility];  Service: General;  Laterality: N/A;  . LEFT HEART CATHETERIZATION WITH CORONARY ANGIOGRAM N/A 10/30/2012   Procedure: LEFT HEART CATHETERIZATION WITH CORONARY ANGIOGRAM;  Surgeon: [name], MD;  Location: [facility];  Service: Cardiovascular;  Laterality: N/A;  . MULTIPLE EXTRACTIONS WITH ALVEOLOPLASTY N/A 02/18/2019   Procedure: Extraction of tooth #'s 5-7, 10,11,14,20-29, 31 and 32 with alveoloplasty and maxiillary left buccal exostoses reductions;  Surgeon: [name], [name];  Location: [facility];  Service: Oral Surgery;  Laterality:  N/A;  . PERCUTANEOUS CORONARY ROTOBLATOR INTERVENTION (PCI-R) N/A 11/05/2012   Procedure: PERCUTANEOUS CORONARY ROTOBLATOR INTERVENTION (PCI-R);  Surgeon: [name], MD;  Location: [facility] CATH LAB;  Service: Cardiovascular;  Laterality: N/A;  . TRACHEOSTOMY TUBE PLACEMENT N/A 02/18/2019   Procedure: Tracheostomy;  Surgeon: [name], MD;  Location: [facility];  Service: ENT;  Laterality: N/A;  . UMBILICAL  HERNIA REPAIR N/A 06/02/2015   Procedure: UMBILICAL HERNIORRHAPHY WITH MESH;  Surgeon: [name], MD;  Location: [facility];  Service: General;  Laterality: N/A;       Family History  Problem Relation Age of Onset  . Hypertension Mother   . Heart disease Father   . Hypertension Father   . Heart disease Sister   . Hypertension Maternal Grandmother   . Hypertension Maternal Grandfather   . Hypertension Paternal Grandmother   . Hypertension Paternal Grandfather     Social History   Tobacco Use  . Smoking status: Former Smoker    Packs/day: 3.00    Years: 32.00    Pack years: 96.00    Types: Cigarettes    Start date: 08/29/1977    Quit date: 11/29/2006    Years since quitting: 12.6  . Smokeless tobacco: Former Systems developer    Types: Chew  . Tobacco comment: 11/05/2012 "chewed tobacco when I play ball; aien't chewed since age 7"  Vaping Use  . Vaping Use: Never used  Substance Use Topics  . Alcohol use: Yes    Alcohol/week: 12.0 standard drinks    Types: 12 Cans of beer per week    Comment: None since 02/2019   . Drug use: Yes    Types: Marijuana    Comment: denies on 04/08/19    Home Medications Prior to Admission medications   Medication Sig Start Date End Date Taking? Authorizing Provider  acetaminophen (TYLENOL) 325 MG tablet Take 2 tablets (650 mg total) by mouth every 6 (six) hours as needed for mild pain, fever or headache (or Fever >/= 101). 04/14/19   Emokpae, Courage, MD  Amino Acids-Protein Hydrolys (FEEDING SUPPLEMENT, PRO-STAT SUGAR FREE 64,) LIQD Place 30 mLs into feeding tube 3 (three) times daily with meals. 04/14/19   Roxan Hockey, MD  amLODipine (NORVASC) 5 MG tablet Take 5 mg by mouth daily. 04/24/19   [provider]  aspirin EC 81 MG tablet Take 81 mg by mouth daily.    [provider]  atorvastatin (LIPITOR) 80 MG tablet Take 1 tablet (80 mg total) by mouth every evening. Patient taking differently: Take 40 mg by mouth in the morning and at  bedtime.  10/23/17   Herminio Commons, MD  Catheters (ARGYLE SUCTION CATHETER 14FR) MISC 1 Device by Does not apply route daily. 02/25/19   Harold Hedge, MD  diazepam (VALIUM) 10 MG tablet Take 10 mg by mouth 3 (three) times daily as needed. 05/28/19   [provider]  diphenoxylate-atropine (LOMOTIL) 2.5-0.025 MG tablet Take 2 tablets by mouth 4 (four) times daily as needed. 04/24/19   [provider]  famotidine (PEPCID) 20 MG tablet Take 20 mg by mouth daily. 04/17/19   [provider]  ferrous sulfate 325 (65 FE) MG tablet Take 325 mg by mouth daily with breakfast.    [provider]  HYDROcodone-acetaminophen (NORCO) 10-325 MG tablet Take 1 tablet by mouth every 4 (four) hours as needed. 06/02/15   [name], MD  lidocaine (XYLOCAINE) 2 % solution Patient: Mix 1part 2% viscous lidocaine, 1part H20. Swish &  swallow 25m of diluted mixture, 380m before meals and at bedtime, up to QID 03/10/19   SqEppie GibsonMD  nitroGLYCERIN (NITROSTAT) 0.4 MG SL tablet Place 1 tablet (0.4 mg total) under the tongue every 5 (five) minutes as needed for chest pain (CP or SOB). 07/27/14   KoHerminio CommonsMD  Nutritional Supplements (FEEDING SUPPLEMENT, OSMOLITE 1.5 CAL,) LIQD Give 2 cartons of osmolite 1.5 at 9:30am, 1:30, 5:30 and 1 carton at 9:30pm.  Flush with 602mf water before and 120m30mter each feeding. Meets 100% of patient needs. Send bolus supplies. Patient taking differently: Place 1,000 mLs into feeding tube in the morning, at noon, in the evening, and at bedtime. Give 2 cartons of osmolite 1.5 at 9:30am, 1:30, 5:30 and 1 carton at 9:30pm.  Flush with 60ml85mwater before and 120ml 64mr each feeding. Meets 100% of patient needs. Send bolus supplies. 03/19/19   SquireEppie GibsonNutritional Supplements (PROSOURCE TF) LIQD Give 30ml 235mes per day via feeding tube.  Mix with 60ml of68mer.  Flush tube with 60ml of 5mr, then place prosource and water mixture  in tube and flush with 60ml of w4m after. Patient taking differently: Place 30 mLs into feeding tube in the morning and at bedtime. Mix with 60ml of wa37m  Flush tube with 60ml of wat82mthen place prosource and water mixture in tube and flush with 60ml of wate22mter. 03/19/19   Squire, SarahEppie Gibsonole (PRILOSEC) 20 MG capsule Take 20 mg by mouth daily.  07/22/18   [provider]  potassium chloride 20 MEQ/15ML (10%) SOLN Place 15 mLs (20 mEq total) into feeding tube daily for 5 days. 04/05/19 04/22/19  Hall, Carole Kayleen Memosrperazine (COMPAZINE) 10 MG tablet TAKE 1 TABLET(10 MG) BY MOUTH EVERY 6 HOURS AS NEEDED FOR NAUSEA 06/30/19   Lockamy, Randi L, NP-C  Tracheostomy Care KIT 1 kit by Does not apply route daily. 02/25/19   Segal, Jared Harold Hedgel (ULTRAM) 50 MG tablet Take 50-100 mg by mouth 4 (four) times daily as needed (pain.).  01/29/19   [provider]  Water For Irrigation, Sterile (FREE WATER) SOLN Place 100 mLs into feeding tube See admin instructions. Please Flush PEG tube with 1 ounce (30 ml) before giving to feed, and then flush with 3 ounces (90ml) after g35mg to feed--- up to 4 times a day 04/14/19   Emokpae, CouraRoxan Hockeyies    Duloxetine and Prednisone  Review of Systems   Review of Systems  Constitutional: Negative.  Negative for chills and fever.  Respiratory: Negative.  Negative for cough.   Cardiovascular: Negative.  Negative for chest pain.  Gastrointestinal: Negative.  Negative for abdominal pain, diarrhea, nausea and vomiting.    Physical Exam Updated Vital Signs BP 123/89 (BP Location: Right Arm)   Pulse 72   Temp 98.4 F (36.9 C) (Oral)   Resp 16   Ht 6' (1.829 m)   Wt 68.5 kg   SpO2 92%   BMI 20.48 kg/m   Physical Exam Constitutional:      General: He is not in acute distress.    Appearance: Normal appearance. He is well-developed. He is not ill-appearing or diaphoretic.  HENT:     Head: Normocephalic and  atraumatic.  Eyes:     General: Vision grossly intact. Gaze aligned appropriately.     Pupils: Pupils are equal, round, and reactive to light.  Neck:  Trachea: Trachea and phonation normal.  Pulmonary:     Effort: Pulmonary effort is normal. No respiratory distress.  Abdominal:     General: There is no distension.     Palpations: Abdomen is soft.     Tenderness: There is no abdominal tenderness. There is no guarding or rebound.     Comments: G-tube present without evidence of infection.  Small piece of plastic visible just inside of tubing.  Musculoskeletal:        General: Normal range of motion.     Cervical back: Normal range of motion.  Skin:    General: Skin is warm and dry.  Neurological:     Mental Status: He is alert.     GCS: GCS eye subscore is 4. GCS verbal subscore is 5. GCS motor subscore is 6.     Comments: Speech is clear and goal oriented, follows commands Major Cranial nerves without deficit, no facial droop Moves extremities without ataxia, coordination intact  Psychiatric:        Behavior: Behavior normal.     ED Results / Procedures / Treatments   Labs (all labs ordered are listed, but only abnormal results are displayed) Labs Reviewed - No data to display  EKG None  Radiology DG ABDOMEN PEG TUBE LOCATION  Result Date: 08/08/2019 CLINICAL DATA:  Peg tube position. EXAM: ABDOMEN - 1 VIEW COMPARISON:  CT abdomen 02/19/2019 FINDINGS: Peg tube injected with 30 mL Omnipaque 300. Peg tube tip is in the gastric antrum. The balloon is inflated in the antrum. There is contrast in the stomach and duodenum. No extravasation. IMPRESSION: Peg tube in the gastric antrum. Electronically Signed   By: Franchot Gallo M.D.   On: 08/08/2019 17:56    Procedures Procedures (including critical care time) Gastrostomy tube replacement Performed by: Deliah Boston Consent: Verbal consent obtained. Risks and benefits: risks, benefits and alternatives were  discussed Required items: required blood products, implants, devices, and special equipment available Patient identity confirmed: hospital-assigned identification number Time out: Immediately prior to procedure a "time out" was called to verify the correct patient, procedure, equipment, support staff and site/side marked as required. Preparation: Patient was prepped and draped in the usual sterile fashion. Patient tolerance: Patient tolerated the procedure well with no immediate complications.  Comments: All tubes easily removed.  20 french Gastrostomy tube placed without difficulty, performed with Dr. Rogene Houston.  Medications Ordered in ED Medications  iohexol (OMNIPAQUE) 300 MG/ML solution 30 mL (30 mLs Per Tube Contrast Given 08/08/19 1741)    ED Course  I have reviewed the triage vital signs and the nursing notes.  Pertinent labs & imaging results that were available during my care of the patient were reviewed by me and considered in my medical decision making (see chart for details).    MDM Rules/Calculators/A&P                          Additional history obtained from: 1. Nursing notes from this visit. 2. Family, wife at bedside. ------------------------ 59 year old male history as above presented for clogged gastrostomy tube.  Small piece of plastic from the cap fell off and is stuck just inside the tubing.  Successful replacement as above without difficulty.  Patient denies any other problems today, no fever/chills, abdominal pain.  Area appears well without evidence of infection.  Post procedure x-ray shows good placement of the G-tube.  Patient urged to follow-up with his PCP and other outpatient specialist.  At this time there does not appear to be any evidence of an acute emergency medical condition and the patient appears stable for discharge with appropriate outpatient follow up. Diagnosis was discussed with patient who verbalizes understanding of care plan and is agreeable  to discharge. I have discussed return precautions with patient who verbalizes understanding. Patient encouraged to follow-up with their PCP. All questions answered.  Patient's case discussed with Dr. Rogene Houston who agrees with plan to discharge with outpatient follow-up.   Note: Portions of this report may have been transcribed using voice recognition software. Every effort was made to ensure accuracy; however, inadvertent computerized transcription errors may still be present. Final Clinical Impression(s) / ED Diagnoses Final diagnoses:  Gastrointestinal tube present [facility])    Rx / DC Orders ED Discharge Orders    None       Gari Crown 08/08/19 1807    Fredia Sorrow, MD 08/12/19 [phone]

## 2019-08-08 NOTE — ED Triage Notes (Signed)
Pt's cap broke off inside tip of feeding tube today

## 2019-08-08 NOTE — ED Notes (Signed)
Tube is #18 F

## 2019-08-08 NOTE — ED Notes (Signed)
Pt has trach and feeding tube   Today opening his feeding tube cap and the end broke off in tube   Here for eval and tube replacement   Tube was [facility] in [facility]

## 2019-08-10 ENCOUNTER — Inpatient Hospital Stay (HOSPITAL_COMMUNITY)
Admission: EM | Admit: 2019-08-10 | Discharge: 2019-08-13 | DRG: 395 | Disposition: A | Discharge status: Home / Self Care | Payer: 59 | Attending: Internal Medicine | Admitting: Internal Medicine

## 2019-08-10 ENCOUNTER — Other Ambulatory Visit (HOSPITAL_COMMUNITY): Payer: Self-pay | Admitting: Nurse Practitioner

## 2019-08-10 ENCOUNTER — Other Ambulatory Visit: Payer: Self-pay

## 2019-08-10 ENCOUNTER — Encounter (HOSPITAL_COMMUNITY): Payer: Self-pay

## 2019-08-10 DIAGNOSIS — K9420 Gastrostomy complication, unspecified: Secondary | ICD-10-CM

## 2019-08-10 DIAGNOSIS — Z8719 Personal history of other diseases of the digestive system: Secondary | ICD-10-CM

## 2019-08-10 DIAGNOSIS — Z9221 Personal history of antineoplastic chemotherapy: Secondary | ICD-10-CM

## 2019-08-10 DIAGNOSIS — Z87891 Personal history of nicotine dependence: Secondary | ICD-10-CM

## 2019-08-10 DIAGNOSIS — M1990 Unspecified osteoarthritis, unspecified site: Secondary | ICD-10-CM | POA: Diagnosis present

## 2019-08-10 DIAGNOSIS — Z596 Low income: Secondary | ICD-10-CM

## 2019-08-10 DIAGNOSIS — Z8521 Personal history of malignant neoplasm of larynx: Secondary | ICD-10-CM

## 2019-08-10 DIAGNOSIS — C140 Malignant neoplasm of pharynx, unspecified: Secondary | ICD-10-CM | POA: Diagnosis present

## 2019-08-10 DIAGNOSIS — Z7982 Long term (current) use of aspirin: Secondary | ICD-10-CM

## 2019-08-10 DIAGNOSIS — K219 Gastro-esophageal reflux disease without esophagitis: Secondary | ICD-10-CM | POA: Diagnosis present

## 2019-08-10 DIAGNOSIS — Z79899 Other long term (current) drug therapy: Secondary | ICD-10-CM

## 2019-08-10 DIAGNOSIS — F419 Anxiety disorder, unspecified: Secondary | ICD-10-CM | POA: Diagnosis present

## 2019-08-10 DIAGNOSIS — K9423 Gastrostomy malfunction: Principal | ICD-10-CM | POA: Diagnosis present

## 2019-08-10 DIAGNOSIS — Z8673 Personal history of transient ischemic attack (TIA), and cerebral infarction without residual deficits: Secondary | ICD-10-CM

## 2019-08-10 DIAGNOSIS — Z955 Presence of coronary angioplasty implant and graft: Secondary | ICD-10-CM

## 2019-08-10 DIAGNOSIS — Z923 Personal history of irradiation: Secondary | ICD-10-CM

## 2019-08-10 DIAGNOSIS — I10 Essential (primary) hypertension: Secondary | ICD-10-CM | POA: Diagnosis present

## 2019-08-10 DIAGNOSIS — Z9889 Other specified postprocedural states: Secondary | ICD-10-CM

## 2019-08-10 DIAGNOSIS — I639 Cerebral infarction, unspecified: Secondary | ICD-10-CM | POA: Diagnosis present

## 2019-08-10 DIAGNOSIS — Z431 Encounter for attention to gastrostomy: Secondary | ICD-10-CM

## 2019-08-10 DIAGNOSIS — I2510 Atherosclerotic heart disease of native coronary artery without angina pectoris: Secondary | ICD-10-CM | POA: Diagnosis present

## 2019-08-10 DIAGNOSIS — C321 Malignant neoplasm of supraglottis: Secondary | ICD-10-CM | POA: Diagnosis present

## 2019-08-10 DIAGNOSIS — Z888 Allergy status to other drugs, medicaments and biological substances status: Secondary | ICD-10-CM

## 2019-08-10 DIAGNOSIS — Z20822 Contact with and (suspected) exposure to covid-19: Secondary | ICD-10-CM | POA: Diagnosis present

## 2019-08-10 DIAGNOSIS — E7800 Pure hypercholesterolemia, unspecified: Secondary | ICD-10-CM | POA: Diagnosis present

## 2019-08-10 LAB — CBC WITH DIFFERENTIAL/PLATELET
Abs Immature Granulocytes: 0.01 10*3/uL (ref 0.00–0.07)
Basophils Absolute: 0 10*3/uL (ref 0.0–0.1)
Basophils Relative: 0 %
Eosinophils Absolute: 0.2 10*3/uL (ref 0.0–0.5)
Eosinophils Relative: 3 %
HCT: 37.4 % — ABNORMAL LOW (ref 39.0–52.0)
Hemoglobin: 12.1 g/dL — ABNORMAL LOW (ref 13.0–17.0)
Immature Granulocytes: 0 %
Lymphocytes Relative: 14 %
Lymphs Abs: 1 10*3/uL (ref 0.7–4.0)
MCH: 30.5 pg (ref 26.0–34.0)
MCHC: 32.4 g/dL (ref 30.0–36.0)
MCV: 94.2 fL (ref 80.0–100.0)
Monocytes Absolute: 0.7 10*3/uL (ref 0.1–1.0)
Monocytes Relative: 9 %
Neutro Abs: 5.6 10*3/uL (ref 1.7–7.7)
Neutrophils Relative %: 74 %
Platelets: 314 10*3/uL (ref 150–400)
RBC: 3.97 MIL/uL — ABNORMAL LOW (ref 4.22–5.81)
RDW: 13.7 % (ref 11.5–15.5)
WBC: 7.5 10*3/uL (ref 4.0–10.5)
nRBC: 0 % (ref 0.0–0.2)

## 2019-08-10 LAB — BASIC METABOLIC PANEL
Anion gap: 12 (ref 5–15)
BUN: 14 mg/dL (ref 6–20)
CO2: 25 mmol/L (ref 22–32)
Calcium: 9.9 mg/dL (ref 8.9–10.3)
Chloride: 101 mmol/L (ref 98–111)
Creatinine, Ser: 0.94 mg/dL (ref 0.61–1.24)
GFR calc Af Amer: >60 mL/min (ref >60)
GFR calc non Af Amer: >60 mL/min (ref >60)
Glucose, Bld: 106 mg/dL — ABNORMAL HIGH (ref 70–99)
Potassium: 3.8 mmol/L (ref 3.5–5.1)
Sodium: 138 mmol/L (ref 135–145)

## 2019-08-10 LAB — SARS CORONAVIRUS 2 BY RT PCR (HOSPITAL ORDER, PERFORMED IN ~~LOC~~ HOSPITAL LAB): SARS Coronavirus 2: NEGATIVE

## 2019-08-10 MED ORDER — MORPHINE SULFATE (PF) 2 MG/ML IV SOLN
2.0000 mg | Freq: Once | INTRAVENOUS | Status: AC
  Administered 2019-08-10: 2 mg via INTRAVENOUS
  Filled 2019-08-10: qty 1

## 2019-08-10 MED ORDER — ONDANSETRON HCL 4 MG PO TABS: 4.0000 mg | ORAL_TABLET | Freq: Four times a day (QID) | ORAL | Status: DC | PRN

## 2019-08-10 MED ORDER — DEXTROSE-NACL 5-0.9 % IV SOLN: INTRAVENOUS | Status: DC

## 2019-08-10 MED ORDER — ONDANSETRON HCL 4 MG/2ML IJ SOLN: 4.0000 mg | Freq: Four times a day (QID) | INTRAMUSCULAR | Status: DC | PRN

## 2019-08-10 MED ORDER — MORPHINE SULFATE (PF) 2 MG/ML IV SOLN
2.0000 mg | INTRAVENOUS | Status: DC | PRN
  Administered 2019-08-11 – 2019-08-12 (×6): 2 mg via INTRAVENOUS
  Filled 2019-08-10 (×6): qty 1

## 2019-08-10 NOTE — H&P (Signed)
History and Physical    [name] GOT:[id number] DOB: 1960/05/12 DOA: 08/10/2019  PCP: [name], MD   Patient coming from: Home  I have personally briefly reviewed patient's old medical records in [place]  Chief Complaint: Dislodged that fell out.  HPI: [name] is a 59 y.o. male with medical history significant for supraglottic neoplasm, hypertension, stroke, coronary artery disease. Patient presented to the ED with reports that he noticed this morning, that his PEG tube fell out.  He was in the ED 2 days ago, then a piece of the cup of his PEG tube fell into the tube and got lodged inside.  This was successfully changed in the ED 8/6, and the postprocedure film confirmed placement..  Patient was able to use his PEG tube until this morning.  Today he denies any complaints except for pain where attempts at resection of the PEG tube failed in the ED.  Per notes, patient had insertion of PEG 2/22 by general surgeon Dr. [name].  An 23 French tube was placed.  It was inserted due to chemotherapy for laryngeal cancer.  Patient was not a candidate for IR gastrostomy tube placement due to abnormal anatomy- as per imaging his stomach lied behind the transverse colon.  ED Course: Stable vitals.  Hemoglobin elevated from baseline at 12, otherwise Unremarkable CBC, BMP.  23 [place] and a 42 [place] G-tube insertion was attempted in the ED, and unsuccessful.  Patient and spouse declined outpatient follow-up for G-tube placement.  Hospitalist to admit.  Review of Systems: As per HPI all other systems reviewed and negative.  Past Medical History:  Diagnosis Date  . Anxiety   . Arthritis    back  . Bradycardia    a. During 11/2012 adm: lopressor decreased.  . Cancer of larynx ([place])   . Carotid artery stenosis   . 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 Coronary artery disease    a. Diag cath  10/2012 for CP/abnormal nuc -> planned PCI s/p LAD atherectomy/DES placement 11/05/12.  Marland Kitchen GERD (gastroesophageal reflux disease)   . History of hiatal hernia   . Hypercholesteremia   . Hypertension   . Stroke ([place]) 07/31/2018   bilateral vertebrobasilar occlusion; "left side weaker thanit was before."    Past Surgical History:  Procedure Laterality Date  . CARDIAC CATHETERIZATION  10/29/2012  . CORONARY ANGIOPLASTY WITH STENT PLACEMENT  11/05/2012  . DIRECT LARYNGOSCOPY N/A 01/02/2019   Procedure: MICRO DIRECT LARYNGOSCOPY BIOPSY OF LARYNGEAL MASS;  Surgeon: [name], MD;  Location: [facility];  Service: ENT;  Laterality: N/A;  . HEMORRHOID SURGERY  ~ 2010  . INSERTION OF MESH N/A 06/02/2015   Procedure: INSERTION OF MESH;  Surgeon: [name], MD;  Location: [facility];  Service: General;  Laterality: N/A;  . IR ANGIO INTRA EXTRACRAN SEL COM CAROTID INNOMINATE BILAT MOD SED  08/02/2018  . IR ANGIO VERTEBRAL SEL VERTEBRAL BILAT MOD SED  08/02/2018  . LAPAROSCOPIC INSERTION GASTROSTOMY TUBE N/A 02/24/2019   Procedure: LAPAROSCOPIC INSERTION GASTROSTOMY TUBE;  Surgeon: [name], MD;  Location: [facility];  Service: General;  Laterality: N/A;  . LEFT HEART CATHETERIZATION WITH CORONARY ANGIOGRAM N/A 10/30/2012   Procedure: LEFT HEART CATHETERIZATION WITH CORONARY ANGIOGRAM;  Surgeon: [name], MD;  Location: [facility];  Service: Cardiovascular;  Laterality: N/A;  . MULTIPLE EXTRACTIONS WITH ALVEOLOPLASTY N/A 02/18/2019  Procedure: Extraction of tooth #'s 5-7, 10,11,14,20-29, 31 and 32 with alveoloplasty and maxiillary left buccal exostoses reductions;  Surgeon: [name], [name];  Location: [facility];  Service: Oral Surgery;  Laterality: N/A;  . PERCUTANEOUS CORONARY ROTOBLATOR INTERVENTION (PCI-R) N/A 11/05/2012   Procedure: PERCUTANEOUS CORONARY ROTOBLATOR INTERVENTION (PCI-R);  Surgeon: [name], MD;  Location: [facility] C[facility];  Service: Cardiovascular;  Laterality: N/A;  . TRACHEOSTOMY  TUBE PLACEMENT N/A 02/18/2019   Procedure: Tracheostomy;  Surgeon: [name], MD;  Location: [facility];  Service: ENT;  Laterality: N/A;  . UMBILICAL HERNIA REPAIR N/A 06/02/2015   Procedure: UMBILICAL HERNIORRHAPHY WITH MESH;  Surgeon: [name], MD;  Location: [facility];  Service: General;  Laterality: N/A;     reports that he quit smoking about 12 years ago. His smoking use included cigarettes. He started smoking about 41 years ago. He has a 96.00 pack-year smoking history. He has quit using smokeless tobacco.  His smokeless tobacco use included chew. He reports current alcohol use of about 12.0 standard drinks of alcohol per week. He reports current drug use. Drug: Marijuana.  Allergies  Allergen Reactions  . Duloxetine Other (See Comments)    Caused depression  . Prednisone Other (See Comments)    Chest pain    Family History  Problem Relation Age of Onset  . Hypertension Mother   . Heart disease Father   . Hypertension Father   . Heart disease Sister   . Hypertension Maternal Grandmother   . Hypertension Maternal Grandfather   . Hypertension Paternal Grandmother   . Hypertension Paternal Grandfather     Prior to Admission medications   Medication Sig Start Date End Date Taking? Authorizing Provider  Amino Acids-Protein Hydrolys (FEEDING SUPPLEMENT, PRO-STAT SUGAR FREE 64,) LIQD Place 30 mLs into feeding tube 3 (three) times daily with meals. 04/14/19  Yes Adelbert Gaspard, Courage, MD  amLODipine (NORVASC) 5 MG tablet Place 5 mg into feeding tube daily.  04/24/19  Yes [provider]  aspirin EC 81 MG tablet Take 81 mg by mouth daily.   Yes [provider]  atorvastatin (LIPITOR) 80 MG tablet Take 1 tablet (80 mg total) by mouth every evening. Patient taking differently: Place 40 mg into feeding tube in the morning and at bedtime.  10/23/17  Yes Herminio Commons, MD  Catheters (ARGYLE SUCTION CATHETER 14FR) MISC 1 Device by Does not apply route daily. 02/25/19  Yes Harold Hedge, MD  clopidogrel (PLAVIX) 75 MG tablet Place 75 mg into feeding tube daily.  07/11/19  Yes [provider]  diazepam (VALIUM) 10 MG tablet Place 10 mg into feeding tube 3 (three) times daily as needed.  05/28/19  Yes [provider]  famotidine (PEPCID) 20 MG tablet Place 20 mg into feeding tube daily.  04/17/19  Yes [provider]  ferrous sulfate 325 (65 FE) MG tablet Take 325 mg by mouth daily with breakfast.   Yes [provider]  HYDROcodone-acetaminophen (NORCO) 10-325 MG tablet Take 1 tablet by mouth every 4 (four) hours as needed. 06/02/15  Yes [name], MD  lidocaine (XYLOCAINE) 2 % solution Patient: Mix 1part 2% viscous lidocaine, 1part H20. Swish & swallow 10m of diluted mixture, 37m before meals and at bedtime, up to QID 03/10/19  Yes SqEppie GibsonMD  metoprolol tartrate (LOPRESSOR) 25 MG tablet Place 12.5 mg into feeding tube 2 (two) times daily.  06/27/19  Yes [provider]  Nutritional Supplements (FEEDING SUPPLEMENT, OSMOLITE 1.5 CAL,)  LIQD Give 2 cartons of osmolite 1.5 at 9:30am, 1:30, 5:30 and 1 carton at 9:30pm.  Flush with 31m of water before and 1219mafter each feeding. Meets 100% of patient needs. Send bolus supplies. Patient taking differently: Place 1,000 mLs into feeding tube in the morning, at noon, in the evening, and at bedtime. Give 2 cartons of osmolite 1.5 at 9:30am, 1:30, 5:30 and 1 carton at 9:30pm.  Flush with 6067mf water before and 120m42mter each feeding. Meets 100% of patient needs. Send bolus supplies. 03/19/19  Yes SquiEppie Gibson  Nutritional Supplements (PROSOURCE TF) LIQD Give 30ml60mimes per day via feeding tube.  Mix with 60ml 67mater.  Flush tube with 60ml o72mter, then place prosource and water mixture in tube and flush with 60ml of63mer after. Patient taking differently: Place 30 mLs into feeding tube in the morning and at bedtime. Mix with 60ml of 70mr.  Flush tube with 60ml of w54m, then  place prosource and water mixture in tube and flush with 60ml of wa94mafter. 03/19/19  Yes Squire, SarEppie Gibsonazole (PRILOSEC) 20 MG capsule Take 20 mg by mouth daily.  07/22/18  Yes [provider]  prochlorperazine (COMPAZINE) 10 MG tablet TAKE 1 TABLET(10 MG) BY MOUTH EVERY 6 HOURS AS NEEDED FOR NAUSEA Patient taking differently: Place 10 mg into feeding tube every 6 (six) hours as needed for nausea.  06/30/19  Yes Lockamy, Randi L, NP-C  Tracheostomy Care KIT 1 kit by Does not apply route daily. 02/25/19  Yes Segal, JareHarold Hedge For Irrigation, Sterile (FREE WATER) SOLN Place 100 mLs into feeding tube See admin instructions. Please Flush PEG tube with 1 ounce (30 ml) before giving to feed, and then flush with 3 ounces (90ml) after76ming to feed--- up to 4 times a day 04/14/19  Yes Sabriyah Wilcher, Courage, MD  acetaminophen (TYLENOL) 325 MG tablet Take 2 tablets (650 mg total) by mouth every 6 (six) hours as needed for mild pain, fever or headache (or Fever >/= 101). 04/14/19   Oliana Gowens, Courage, MD  diphenoxylate-atropine (LOMOTIL) 2.5-0.025 MG tablet Place 2 tablets into feeding tube 4 (four) times daily as needed for diarrhea or loose stools.  04/24/19   [provider]  nitroGLYCERIN (NITROSTAT) 0.4 MG SL tablet Place 1 tablet (0.4 mg total) under the tongue every 5 (five) minutes as needed for chest pain (CP or SOB). 07/27/14   Koneswaran, Herminio Commonsium chloride 20 MEQ/15ML (10%) SOLN Place 15 mLs (20 mEq total) into feeding tube daily for 5 days. 04/05/19 04/22/19  Hall, CaroleKayleen Memosol (ULTRAM) 50 MG tablet Take 50-100 mg by mouth 4 (four) times daily as needed (pain.).  01/29/19   [provider]    Physical Exam: Vitals:   08/10/19 1219 08/10/19 1221 08/10/19 1458  BP: (!) 142/98  140/88  Pulse: (!) 111  98  Resp: 18    Temp: 98.7 F (37.1 C)  99 F (37.2 C)  TempSrc: Oral  Oral  SpO2: 93%  97%  Weight:  68.9 kg   Height:  6' (1.829 m)      Constitutional: NAD, calm, comfortable Vitals:   08/10/19 1219 08/10/19 1221 08/10/19 1458  BP: (!) 142/98  140/88  Pulse: (!) 111  98  Resp: 18    Temp: 98.7 F (37.1 C)  99 F (37.2 C)  TempSrc: Oral  Oral  SpO2: 93%  97%  Weight:  68.9 kg   Height:  6' (1.829 m)    Eyes: PERRL, lids and conjunctivae normal ENMT: Mucous membranes are dry  Neck: normal, supple, no masses, no thyromegaly Respiratory: Trach collar present, on room air.  Clear to auscultation bilaterally, no wheezing, no crackles. Normal respiratory effort. No accessory muscle use.  Cardiovascular: Regular rate and rhythm, no murmurs / rubs / gallops. No extremity edema. 2+ pedal pulses.  Abdomen: PEG insertion site upper abdomen, surrounding skin without erythema or drainage, no tenderness, no masses palpated. No hepatosplenomegaly. Bowel sounds positive.  Musculoskeletal: no clubbing / cyanosis. No joint deformity upper and lower extremities. Good ROM, no contractures. Normal muscle tone.  Skin: no rashes, lesions, ulcers. No induration Neurologic: Has some mild deficits from prior stroke, but mostly 5 of 5 strength bilateral upper extremity, 4+5 strength left lower extremity, 4/5 strength right lower extremity. Psychiatric: Normal judgment and insight. Alert and oriented x 3. Normal mood.   Labs on Admission: I have personally reviewed following labs and imaging studies  CBC: Recent Labs  Lab 08/10/19 1538  WBC 7.5  NEUTROABS 5.6  HGB 12.1*  HCT 37.4*  MCV 94.2  PLT 612   Basic Metabolic Panel: Recent Labs  Lab 08/10/19 1538  NA 138  K 3.8  CL 101  CO2 25  GLUCOSE 106*  BUN 14  CREATININE 0.94  CALCIUM 9.9    Radiological Exams on Admission: DG ABDOMEN PEG TUBE LOCATION  Result Date: 08/08/2019 CLINICAL DATA:  Peg tube position. EXAM: ABDOMEN - 1 VIEW COMPARISON:  CT abdomen 02/19/2019 FINDINGS: Peg tube injected with 30 mL Omnipaque 300. Peg tube tip is in the gastric antrum. The balloon  is inflated in the antrum. There is contrast in the stomach and duodenum. No extravasation. IMPRESSION: Peg tube in the gastric antrum. Electronically Signed   By: Franchot Gallo M.D.   On: 08/08/2019 17:56    EKG: None.   Assessment/Plan Principal Problem:   PEG tube malfunction (HCC) Active Problems:   Hypertension   Coronary artery disease   Stroke [facility])   Malignant neoplasm of supraglottis (HCC)   Hx of tracheostomy  PEG tube malfunction-initially inserted by general surgery.  Not an IR candidate.  Failed attempts x2 in the ED. -Consult general surgery -N.p.o. except for sips of water, patient states he tolerates this well - D5 N/s 75cc/hr  Laryngeal cancer- has trach collar, PEG tube.  Follows with Dr. Delton Coombes. S/p radiation tx. Per notes last chemotherapy 05/15/2018.  Stroke-mild residual deficits.  Follows with Dr. [name]. - Patient tells me takes only aspirin, Plavix was discontinued after bleeding from tracheostomy site 03/2019.  -Aspirin, Plavix, statins held for now while n.p.o.  Hypertension-stable. -Spouse at bedside reports patient is not on any antihypertensive medications, this was discontinued.  Norvasc and metoprolol on medication list, awaiting med reconciliation.   DVT prophylaxis: SCDs, pending GI evaluation. Code Status: Full code Family Communication: None at bedside Disposition Plan: 1 to 2 days Consults called: General surgery Admission status: Observation, MedSurg.   Bethena Roys MD [facility]  08/10/2019, 6:58 PM

## 2019-08-10 NOTE — ED Triage Notes (Signed)
Pt to er, pt states that his feeding tube fell out last night or this am.  Pt has trach.  Pt has feeding tube, appears intact.

## 2019-08-10 NOTE — ED Notes (Signed)
Pt spouse   Mrs Gipe   [phone]

## 2019-08-10 NOTE — ED Notes (Signed)
meds reviewed from spouse list   She reports pt no longer takes  Clopidogrel 75mg  daily  Lisinopril 20 mg daily  NTG 0.4mg  SL PRN

## 2019-08-10 NOTE — ED Notes (Addendum)
Pt here several days ago for same   His 62 F was replaced at that time with smaller feeding tube   Original tube (20F) was placed by interventional rad at [facility] as tube fell out last night

## 2019-08-10 NOTE — ED Provider Notes (Signed)
[facility] EMERGENCY DEPARTMENT Provider Note   CSN: [id number] Arrival date & time: 08/10/19  1211     History Chief Complaint  Patient presents with  . Other    feeding tube problem    [name] is a 59 y.o. male history of laryngeal cancer, G-tube, GERD, hypertension, hypercholesterolemia, CVA.  Patient presents today after his G-tube fell out.  I saw this patient 2 days ago, 08/08/2019.  At that time his 27 [place] G-tube had a piece of plastic from the Stuck down in the tubing.  We replaced the tubing at that time successfully.  Unfortunately we did not have another 28 [place] G-tube in the facility and replaced it with a larger 20 [place] G-tube.  We obtained a post procedure Gastrografin film which confirmed placement.  Patient reports that he returned home and has been using the G-tube, sometime yesterday when he went to bed the tube had been pulled out and he did not notice it until this morning.  He denies any additional concerns, denies any pain.  No fever/chills, abdominal pain, vomiting, diarrhea or any additional concerns.  HPI     Past Medical History:  Diagnosis Date  . Anxiety   . Arthritis    back  . Bradycardia    a. During 11/2012 adm: lopressor decreased.  . Cancer of larynx ([place])   . Carotid artery stenosis   . 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 Coronary artery disease    a. Diag cath 10/2012 for CP/abnormal nuc -> planned PCI s/p LAD atherectomy/DES placement 11/05/12.  Marland Kitchen GERD (gastroesophageal reflux disease)   . History of hiatal hernia   . Hypercholesteremia   . Hypertension   . Stroke ([place]) 07/31/2018   bilateral vertebrobasilar occlusion; "left side weaker thanit was before."    Patient Active Problem List   Diagnosis Date Noted  . HCAP (healthcare-associated pneumonia) 04/10/2019  . Febrile illness, acute 04/08/2019  . Malnutrition of moderate degree  04/03/2019  . GI bleed 04/01/2019  . Dehydration 03/28/2019  . RLL pneumonia 03/17/2019  . Hx of tracheostomy 02/18/2019  . Head and neck cancer ([place]) 02/18/2019  . 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 Stroke ([place]) 07/31/2018  . Rotator cuff syndrome of right shoulder 02/20/2013  . Bradycardia 11/06/2012  . Coronary artery disease   . Abnormal finding on cardiovascular stress test 09/25/2012  . Hypertension 09/03/2012  . Chest pain 09/03/2012  . Alcohol abuse, daily use 09/03/2012  . Hyperlipidemia 09/03/2012  . Hyponatremia 09/03/2012    Past Surgical History:  Procedure Laterality Date  . CARDIAC CATHETERIZATION  10/29/2012  . CORONARY ANGIOPLASTY WITH STENT PLACEMENT  11/05/2012  . DIRECT LARYNGOSCOPY N/A 01/02/2019   Procedure: MICRO DIRECT LARYNGOSCOPY BIOPSY OF LARYNGEAL MASS;  Surgeon: [name], MD;  Location: [place] [facility];  Service: ENT;  Laterality: N/A;  . HEMORRHOID SURGERY  ~ 2010  . INSERTION OF MESH N/A 06/02/2015   Procedure: INSERTION OF MESH;  Surgeon: [name], MD;  Location: [facility];  Service: General;  Laterality: N/A;  . IR ANGIO INTRA EXTRACRAN SEL COM CAROTID INNOMINATE BILAT MOD SED  08/02/2018  . IR ANGIO VERTEBRAL SEL VERTEBRAL BILAT MOD SED  08/02/2018  . LAPAROSCOPIC INSERTION GASTROSTOMY TUBE N/A 02/24/2019   Procedure: LAPAROSCOPIC INSERTION GASTROSTOMY TUBE;  Surgeon: [name], MD;  Location: [place];  Service: General;  Laterality: N/A;  .  LEFT HEART CATHETERIZATION WITH CORONARY ANGIOGRAM N/A 10/30/2012   Procedure: LEFT HEART CATHETERIZATION WITH CORONARY ANGIOGRAM;  Surgeon: [name], MD;  Location: [place] CATH LAB;  Service: Cardiovascular;  Laterality: N/A;  . MULTIPLE EXTRACTIONS WITH ALVEOLOPLASTY N/A 02/18/2019   Procedure: Extraction of tooth #'s 5-7, 10,11,14,20-29, 31 and 32 with alveoloplasty and maxiillary left buccal exostoses reductions;  Surgeon: [name], [name];  Location: [place];  Service: Oral Surgery;   Laterality: N/A;  . PERCUTANEOUS CORONARY ROTOBLATOR INTERVENTION (PCI-R) N/A 11/05/2012   Procedure: PERCUTANEOUS CORONARY ROTOBLATOR INTERVENTION (PCI-R);  Surgeon: [name], MD;  Location: [facility] CATH LAB;  Service: Cardiovascular;  Laterality: N/A;  . TRACHEOSTOMY TUBE PLACEMENT N/A 02/18/2019   Procedure: Tracheostomy;  Surgeon: [name], MD;  Location: [place];  Service: ENT;  Laterality: N/A;  . UMBILICAL HERNIA REPAIR N/A 06/02/2015   Procedure: UMBILICAL HERNIORRHAPHY WITH MESH;  Surgeon: [name], MD;  Location: [facility];  Service: General;  Laterality: N/A;       Family History  Problem Relation Age of Onset  . Hypertension Mother   . Heart disease Father   . Hypertension Father   . Heart disease Sister   . Hypertension Maternal Grandmother   . Hypertension Maternal Grandfather   . Hypertension Paternal Grandmother   . Hypertension Paternal Grandfather     Social History   Tobacco Use  . Smoking status: Former Smoker    Packs/day: 3.00    Years: 32.00    Pack years: 96.00    Types: Cigarettes    Start date: 08/29/1977    Quit date: 11/29/2006    Years since quitting: 12.7  . Smokeless tobacco: Former Systems developer    Types: Chew  . Tobacco comment: 11/05/2012 "chewed tobacco when I play ball; aien't chewed since age 65"  Vaping Use  . Vaping Use: Never used  Substance Use Topics  . Alcohol use: Yes    Alcohol/week: 12.0 standard drinks    Types: 12 Cans of beer per week    Comment: None since 02/2019   . Drug use: Yes    Types: Marijuana    Comment: denies on 04/08/19    Home Medications Prior to Admission medications   Medication Sig Start Date End Date Taking? Authorizing Provider  Amino Acids-Protein Hydrolys (FEEDING SUPPLEMENT, PRO-STAT SUGAR FREE 64,) LIQD Place 30 mLs into feeding tube 3 (three) times daily with meals. 04/14/19  Yes Emokpae, Courage, MD  amLODipine (NORVASC) 5 MG tablet Place 5 mg into feeding tube daily.  04/24/19  Yes [provider]   aspirin EC 81 MG tablet Take 81 mg by mouth daily.   Yes [provider]  atorvastatin (LIPITOR) 80 MG tablet Take 1 tablet (80 mg total) by mouth every evening. Patient taking differently: Place 40 mg into feeding tube in the morning and at bedtime.  10/23/17  Yes Herminio Commons, MD  Catheters (ARGYLE SUCTION CATHETER 14FR) MISC 1 Device by Does not apply route daily. 02/25/19  Yes Harold Hedge, MD  clopidogrel (PLAVIX) 75 MG tablet Place 75 mg into feeding tube daily.  07/11/19  Yes [provider]  diazepam (VALIUM) 10 MG tablet Place 10 mg into feeding tube 3 (three) times daily as needed.  05/28/19  Yes [provider]  famotidine (PEPCID) 20 MG tablet Place 20 mg into feeding tube daily.  04/17/19  Yes [provider]  ferrous sulfate 325 (65 FE) MG tablet Take 325 mg by mouth daily with  breakfast.   Yes [provider]  HYDROcodone-acetaminophen (NORCO) 10-325 MG tablet Take 1 tablet by mouth every 4 (four) hours as needed. 06/02/15  Yes [name], MD  lidocaine (XYLOCAINE) 2 % solution Patient: Mix 1part 2% viscous lidocaine, 1part H20. Swish & swallow 60m of diluted mixture, 330m before meals and at bedtime, up to QID 03/10/19  Yes SqEppie GibsonMD  metoprolol tartrate (LOPRESSOR) 25 MG tablet Place 12.5 mg into feeding tube 2 (two) times daily.  06/27/19  Yes [provider]  Nutritional Supplements (FEEDING SUPPLEMENT, OSMOLITE 1.5 CAL,) LIQD Give 2 cartons of osmolite 1.5 at 9:30am, 1:30, 5:30 and 1 carton at 9:30pm.  Flush with 6047mf water before and 120m76mter each feeding. Meets 100% of patient needs. Send bolus supplies. Patient taking differently: Place 1,000 mLs into feeding tube in the morning, at noon, in the evening, and at bedtime. Give 2 cartons of osmolite 1.5 at 9:30am, 1:30, 5:30 and 1 carton at 9:30pm.  Flush with 60ml36mwater before and 120ml 63mr each feeding. Meets 100% of patient needs. Send bolus supplies.  03/19/19  Yes SquireEppie GibsonNutritional Supplements (PROSOURCE TF) LIQD Give 30ml 256mes per day via feeding tube.  Mix with 60ml of54mer.  Flush tube with 60ml of 58mr, then place prosource and water mixture in tube and flush with 60ml of w45m after. Patient taking differently: Place 30 mLs into feeding tube in the morning and at bedtime. Mix with 60ml of wa32m  Flush tube with 60ml of wat13mthen place prosource and water mixture in tube and flush with 60ml of wate12mter. 03/19/19  Yes Squire, SarahEppie Gibsonole (PRILOSEC) 20 MG capsule Take 20 mg by mouth daily.  07/22/18  Yes [provider]  prochlorperazine (COMPAZINE) 10 MG tablet TAKE 1 TABLET(10 MG) BY MOUTH EVERY 6 HOURS AS NEEDED FOR NAUSEA Patient taking differently: Place 10 mg into feeding tube every 6 (six) hours as needed for nausea.  06/30/19  Yes Lockamy, Randi L, NP-C  Tracheostomy Care KIT 1 kit by Does not apply route daily. 02/25/19  Yes Segal, Jared Harold Hedgeor Irrigation, Sterile (FREE WATER) SOLN Place 100 mLs into feeding tube See admin instructions. Please Flush PEG tube with 1 ounce (30 ml) before giving to feed, and then flush with 3 ounces (90ml) after g87mg to feed--- up to 4 times a day 04/14/19  Yes Emokpae, Courage, MD  acetaminophen (TYLENOL) 325 MG tablet Take 2 tablets (650 mg total) by mouth every 6 (six) hours as needed for mild pain, fever or headache (or Fever >/= 101). 04/14/19   Emokpae, Courage, MD  diphenoxylate-atropine (LOMOTIL) 2.5-0.025 MG tablet Place 2 tablets into feeding tube 4 (four) times daily as needed for diarrhea or loose stools.  04/24/19   [provider]  nitroGLYCERIN (NITROSTAT) 0.4 MG SL tablet Place 1 tablet (0.4 mg total) under the tongue every 5 (five) minutes as needed for chest pain (CP or SOB). 07/27/14   Koneswaran, SuHerminio Commonsm chloride 20 MEQ/15ML (10%) SOLN Place 15 mLs (20 mEq total) into feeding tube daily for 5 days. 04/05/19 04/22/19  Hall,  Carole Kayleen Memos (ULTRAM) 50 MG tablet Take 50-100 mg by mouth 4 (four) times daily as needed (pain.).  01/29/19   [provider]    Allergies    Duloxetine and Prednisone  Review of Systems   Review of Systems Ten systems are reviewed and  are negative for acute change except as noted in the HPI  Physical Exam Updated Vital Signs BP 140/88 (BP Location: Left Arm)   Pulse 98   Temp 99 F (37.2 C) (Oral)   Resp 18   Ht 6' (1.829 m)   Wt 68.9 kg   SpO2 97%   BMI 20.61 kg/m   Physical Exam Constitutional:      General: He is not in acute distress.    Appearance: Normal appearance. He is well-developed. He is not ill-appearing or diaphoretic.  HENT:     Head: Normocephalic and atraumatic.  Eyes:     General: Vision grossly intact. Gaze aligned appropriately.     Pupils: Pupils are equal, round, and reactive to light.  Neck:     Trachea: Trachea and phonation normal.  Pulmonary:     Effort: Pulmonary effort is normal. No respiratory distress.  Abdominal:     General: There is no distension.     Palpations: Abdomen is soft.     Tenderness: There is no abdominal tenderness. There is no guarding or rebound.       Comments: Ostomy  Musculoskeletal:        General: Normal range of motion.     Cervical back: Normal range of motion.  Skin:    General: Skin is warm and dry.  Neurological:     Mental Status: He is alert.     GCS: GCS eye subscore is 4. GCS verbal subscore is 5. GCS motor subscore is 6.     Comments: Speech is clear and goal oriented, follows commands Major Cranial nerves without deficit, no facial droop Moves extremities without ataxia, coordination intact  Psychiatric:        Behavior: Behavior normal.     ED Results / Procedures / Treatments   Labs (all labs ordered are listed, but only abnormal results are displayed) Labs Reviewed  CBC WITH DIFFERENTIAL/PLATELET - Abnormal; Notable for the following components:      Result Value    RBC 3.97 (*)    Hemoglobin 12.1 (*)    HCT 37.4 (*)    All other components within normal limits  BASIC METABOLIC PANEL - Abnormal; Notable for the following components:   Glucose, Bld 106 (*)    All other components within normal limits  SARS CORONAVIRUS 2 BY RT PCR (HOSPITAL ORDER, [place] [facility])    EKG None  Radiology DG ABDOMEN PEG TUBE LOCATION  Result Date: 08/08/2019 CLINICAL DATA:  Peg tube position. EXAM: ABDOMEN - 1 VIEW COMPARISON:  CT abdomen 02/19/2019 FINDINGS: Peg tube injected with 30 mL Omnipaque 300. Peg tube tip is in the gastric antrum. The balloon is inflated in the antrum. There is contrast in the stomach and duodenum. No extravasation. IMPRESSION: Peg tube in the gastric antrum. Electronically Signed   By: Franchot Gallo M.D.   On: 08/08/2019 17:56    Procedures Procedures (including critical care time)  Gastrostomy tube replacement Performed by: Deliah Boston Consent: Verbal consent obtained. Risks and benefits: risks, benefits and alternatives were discussed Required items: required blood products, implants, devices, and special equipment available Patient identity confirmed: hospital-assigned identification number Time out: Immediately prior to procedure a "time out" was called to verify the correct patient, procedure, equipment, support staff and site/side marked as required. Preparation: Patient was prepped and draped in the usual sterile fashion. Patient tolerance: Patient tolerated the procedure well with no immediate complications.  Comments: I initially attempted to  replace the G-tube with another 20 [place].  Unable to pass G-tube into the stomach.  Dr. Roderic Palau then assisted and tried to pass the 20 [place] G-tube without success.  We have no 18 French tubes available in the facility so a 49 [place] was then attempted by Dr. Roderic Palau without success.   Medications Ordered in ED Medications - No data to display  ED Course  I  have reviewed the triage vital signs and the nursing notes.  Pertinent labs & imaging results that were available during my care of the patient were reviewed by me and considered in my medical decision making (see chart for details).    MDM Rules/Calculators/A&P                          Additional history obtained from: 1. Nursing notes from this visit. 2. Electronic medical record reviewed previous ER visit. 3. Family, wife at bedside. --------------------------------------- Unable to pass a 20 [place] or a 41 [place] G-tube.  Dr. Roderic Palau attempted as well.  Shared decision making made with the patient and his wife, we offered patient that he could return at home and follow-up outpatient for replacement of G-tube tomorrow.  Patient and his wife are very anxious and reported that he has not really had any intake in the past 2 days since his original G-tube fell out on 6 August they are asking for admission.  Plan of care is to consult hospitalist service to help for admission and to coordinate replacement of G-tube and possible TPN if needed.  I discussed the case with Dr. Denton Brick who accepted patient to hospitalist service.  Basic labs were obtained reviewed below.  CBC shows no leukocytosis to suggest infection, hemoglobin of 12.1 improved from prior.  BMP shows no emergent electrolyte derangement, AKI or gap.  Covid test was negative.  Note: Portions of this report may have been transcribed using voice recognition software. Every effort was made to ensure accuracy; however, inadvertent computerized transcription errors may still be present. Final Clinical Impression(s) / ED Diagnoses Final diagnoses:  Complication of gastrostomy tube [facility])    Rx / DC Orders ED Discharge Orders    None       Gari Crown 08/10/19 1729    Milton Ferguson, MD 08/12/19 1026

## 2019-08-11 ENCOUNTER — Observation Stay (HOSPITAL_COMMUNITY): Payer: 59 | Admitting: Certified Registered Nurse Anesthetist

## 2019-08-11 ENCOUNTER — Telehealth (HOSPITAL_COMMUNITY): Payer: Self-pay | Admitting: Speech Pathology

## 2019-08-11 ENCOUNTER — Encounter (HOSPITAL_COMMUNITY): Payer: Self-pay | Admitting: Internal Medicine

## 2019-08-11 ENCOUNTER — Encounter (HOSPITAL_COMMUNITY)
Admission: EM | Disposition: A | Discharge status: Home / Self Care | Payer: Self-pay | Source: Home / Self Care | Attending: Internal Medicine

## 2019-08-11 ENCOUNTER — Inpatient Hospital Stay (HOSPITAL_COMMUNITY): Payer: 59

## 2019-08-11 DIAGNOSIS — I10 Essential (primary) hypertension: Secondary | ICD-10-CM | POA: Diagnosis present

## 2019-08-11 DIAGNOSIS — I25118 Atherosclerotic heart disease of native coronary artery with other forms of angina pectoris: Secondary | ICD-10-CM | POA: Diagnosis not present

## 2019-08-11 DIAGNOSIS — Z9889 Other specified postprocedural states: Secondary | ICD-10-CM | POA: Diagnosis not present

## 2019-08-11 DIAGNOSIS — Z431 Encounter for attention to gastrostomy: Secondary | ICD-10-CM | POA: Diagnosis present

## 2019-08-11 DIAGNOSIS — Z8521 Personal history of malignant neoplasm of larynx: Secondary | ICD-10-CM | POA: Diagnosis not present

## 2019-08-11 DIAGNOSIS — Z888 Allergy status to other drugs, medicaments and biological substances status: Secondary | ICD-10-CM | POA: Diagnosis not present

## 2019-08-11 DIAGNOSIS — K9423 Gastrostomy malfunction: Secondary | ICD-10-CM | POA: Diagnosis present

## 2019-08-11 DIAGNOSIS — C321 Malignant neoplasm of supraglottis: Secondary | ICD-10-CM | POA: Diagnosis present

## 2019-08-11 DIAGNOSIS — Z955 Presence of coronary angioplasty implant and graft: Secondary | ICD-10-CM | POA: Diagnosis not present

## 2019-08-11 DIAGNOSIS — M1990 Unspecified osteoarthritis, unspecified site: Secondary | ICD-10-CM | POA: Diagnosis present

## 2019-08-11 DIAGNOSIS — Z7982 Long term (current) use of aspirin: Secondary | ICD-10-CM | POA: Diagnosis not present

## 2019-08-11 DIAGNOSIS — Z87891 Personal history of nicotine dependence: Secondary | ICD-10-CM | POA: Diagnosis not present

## 2019-08-11 DIAGNOSIS — Z923 Personal history of irradiation: Secondary | ICD-10-CM | POA: Diagnosis not present

## 2019-08-11 DIAGNOSIS — I2510 Atherosclerotic heart disease of native coronary artery without angina pectoris: Secondary | ICD-10-CM | POA: Diagnosis present

## 2019-08-11 DIAGNOSIS — Z9221 Personal history of antineoplastic chemotherapy: Secondary | ICD-10-CM | POA: Diagnosis not present

## 2019-08-11 DIAGNOSIS — K219 Gastro-esophageal reflux disease without esophagitis: Secondary | ICD-10-CM | POA: Diagnosis present

## 2019-08-11 DIAGNOSIS — Z596 Low income: Secondary | ICD-10-CM | POA: Diagnosis not present

## 2019-08-11 DIAGNOSIS — Z79899 Other long term (current) drug therapy: Secondary | ICD-10-CM | POA: Diagnosis not present

## 2019-08-11 DIAGNOSIS — Z8719 Personal history of other diseases of the digestive system: Secondary | ICD-10-CM | POA: Diagnosis not present

## 2019-08-11 DIAGNOSIS — E7800 Pure hypercholesterolemia, unspecified: Secondary | ICD-10-CM | POA: Diagnosis present

## 2019-08-11 DIAGNOSIS — C140 Malignant neoplasm of pharynx, unspecified: Secondary | ICD-10-CM | POA: Diagnosis present

## 2019-08-11 DIAGNOSIS — Z20822 Contact with and (suspected) exposure to covid-19: Secondary | ICD-10-CM | POA: Diagnosis present

## 2019-08-11 DIAGNOSIS — Z8673 Personal history of transient ischemic attack (TIA), and cerebral infarction without residual deficits: Secondary | ICD-10-CM | POA: Diagnosis not present

## 2019-08-11 DIAGNOSIS — F419 Anxiety disorder, unspecified: Secondary | ICD-10-CM | POA: Diagnosis present

## 2019-08-11 HISTORY — PX: ESOPHAGOGASTRODUODENOSCOPY (EGD) WITH PROPOFOL: SHX5813

## 2019-08-11 LAB — CBG MONITORING, ED: Glucose-Capillary: 94 mg/dL (ref 70–99)

## 2019-08-11 LAB — GLUCOSE, CAPILLARY: Glucose-Capillary: 79 mg/dL (ref 70–99)

## 2019-08-11 SURGERY — ESOPHAGOGASTRODUODENOSCOPY (EGD) WITH PROPOFOL
Anesthesia: General

## 2019-08-11 MED ORDER — FENTANYL CITRATE (PF) 100 MCG/2ML IJ SOLN: 25.0000 ug | INTRAMUSCULAR | Status: DC | PRN

## 2019-08-11 MED ORDER — PROPOFOL 10 MG/ML IV BOLUS
INTRAVENOUS | Status: AC
  Filled 2019-08-11: qty 20

## 2019-08-11 MED ORDER — CHLORHEXIDINE GLUCONATE 0.12 % MT SOLN: 15.0000 mL | Freq: Once | OROMUCOSAL | Status: DC

## 2019-08-11 MED ORDER — LACTATED RINGERS IV SOLN: INTRAVENOUS | Status: DC

## 2019-08-11 MED ORDER — LABETALOL HCL 5 MG/ML IV SOLN
5.0000 mg | INTRAVENOUS | Status: DC | PRN
  Administered 2019-08-11: 5 mg via INTRAVENOUS

## 2019-08-11 MED ORDER — LACTATED RINGERS IV SOLN
INTRAVENOUS | Status: DC | PRN
Start: 2019-08-11 — End: 2019-08-11

## 2019-08-11 MED ORDER — PROPOFOL 500 MG/50ML IV EMUL
INTRAVENOUS | Status: DC | PRN
  Administered 2019-08-11: 75 ug/kg/min via INTRAVENOUS

## 2019-08-11 MED ORDER — LABETALOL HCL 5 MG/ML IV SOLN
INTRAVENOUS | Status: AC
  Filled 2019-08-11: qty 4

## 2019-08-11 MED ORDER — PHENYLEPHRINE 40 MCG/ML (10ML) SYRINGE FOR IV PUSH (FOR BLOOD PRESSURE SUPPORT)
PREFILLED_SYRINGE | INTRAVENOUS | Status: AC
  Filled 2019-08-11: qty 10

## 2019-08-11 MED ORDER — ROCURONIUM BROMIDE 10 MG/ML (PF) SYRINGE
PREFILLED_SYRINGE | INTRAVENOUS | Status: AC
  Filled 2019-08-11: qty 10

## 2019-08-11 MED ORDER — CEFAZOLIN SODIUM-DEXTROSE 2-4 GM/100ML-% IV SOLN
2.0000 g | Freq: Three times a day (TID) | INTRAVENOUS | Status: DC
  Administered 2019-08-12 – 2019-08-13 (×4): 2 g via INTRAVENOUS
  Filled 2019-08-11 (×6): qty 100

## 2019-08-11 MED ORDER — CEFAZOLIN SODIUM-DEXTROSE 2-4 GM/100ML-% IV SOLN
INTRAVENOUS | Status: AC
  Filled 2019-08-11: qty 100

## 2019-08-11 MED ORDER — LIDOCAINE 2% (20 MG/ML) 5 ML SYRINGE
INTRAMUSCULAR | Status: DC | PRN
  Administered 2019-08-11: 40 mg via INTRAVENOUS

## 2019-08-11 MED ORDER — LIDOCAINE 2% (20 MG/ML) 5 ML SYRINGE
INTRAMUSCULAR | Status: AC
  Filled 2019-08-11: qty 10

## 2019-08-11 MED ORDER — MIDAZOLAM HCL 5 MG/5ML IJ SOLN
INTRAMUSCULAR | Status: DC | PRN
  Administered 2019-08-11: 2 mg via INTRAVENOUS

## 2019-08-11 MED ORDER — MIDAZOLAM HCL 2 MG/2ML IJ SOLN
INTRAMUSCULAR | Status: AC
  Filled 2019-08-11: qty 2

## 2019-08-11 MED ORDER — ONDANSETRON HCL 4 MG/2ML IJ SOLN
INTRAMUSCULAR | Status: DC | PRN
  Administered 2019-08-11: 4 mg via INTRAVENOUS

## 2019-08-11 MED ORDER — PROPOFOL 10 MG/ML IV BOLUS
INTRAVENOUS | Status: DC | PRN
  Administered 2019-08-11 (×2): 30 mg via INTRAVENOUS
  Administered 2019-08-11: 20 mg via INTRAVENOUS

## 2019-08-11 MED ORDER — CEFAZOLIN SODIUM-DEXTROSE 2-4 GM/100ML-% IV SOLN
2.0000 g | INTRAVENOUS | Status: AC
  Administered 2019-08-11: 2 g via INTRAVENOUS

## 2019-08-11 MED ORDER — CHLORHEXIDINE GLUCONATE CLOTH 2 % EX PADS: 6.0000 | MEDICATED_PAD | Freq: Once | CUTANEOUS | Status: DC

## 2019-08-11 MED ORDER — ONDANSETRON HCL 4 MG/2ML IJ SOLN
INTRAMUSCULAR | Status: AC
  Filled 2019-08-11: qty 6

## 2019-08-11 MED ORDER — ORAL CARE MOUTH RINSE: 15.0000 mL | Freq: Once | OROMUCOSAL | Status: DC

## 2019-08-11 MED ORDER — STERILE WATER FOR IRRIGATION IR SOLN
Status: DC | PRN
  Administered 2019-08-11: 1000 mL

## 2019-08-11 MED ORDER — LIDOCAINE HCL (PF) 1 % IJ SOLN
INTRAMUSCULAR | Status: DC | PRN
  Administered 2019-08-11: 3 mL

## 2019-08-11 MED ORDER — DEXAMETHASONE SODIUM PHOSPHATE 10 MG/ML IJ SOLN
INTRAMUSCULAR | Status: AC
  Filled 2019-08-11: qty 2

## 2019-08-11 SURGICAL SUPPLY — 1 items: EndoVive Safety PEG Kit 20FR IMPLANT

## 2019-08-11 NOTE — Op Note (Signed)
Patient:  [name]  DOB:  Jul 30, 1960  MRN:  [id number]   Preop Diagnosis: Laryngeal carcinoma, dislodgment of gastrostomy tube  Postop Diagnosis: Same, unable to transverse esophagus  Procedure: Aborted EGD with PEG  Surgeon: [name], MD  Assistant: Curlene Labrum, MD  Anes: MAC  Indications: Patient is a 59 year old white male status post a laparoscopic gastrostomy tube placement earlier this year for nutrition due to laryngeal carcinoma who presents with a dislodged gastrostomy tube.  I attempted to replace it at bedside, but the hole had already sealed over.  The patient now comes the operating room for an EGD with PEG.  The risks and benefits of the procedure including bleeding, infection, and perforation were fully explained to the patient, who gave informed consent.  Procedure note: The patient was placed in supine position.  After monitored anesthesia care was given, surgical site confirmation was performed.  I attempted to advance the endoscope into the esophagus.  Despite multiple attempts including jaw thrust, and repositioning, I could never satisfactorily intubate the lumen of the upper esophagus.  I suspect this may be secondary to his laryngeal cancer and his radiation treatment.  I thus aborted the procedure.  The patient was transferred to PACU in stable condition.  I will contact the hospitalist for IR replacement of gastrostomy tube.  Complications: Inability to complete procedure  EBL: None  Specimen: None

## 2019-08-11 NOTE — Transfer of Care (Signed)
Immediate Anesthesia Transfer of Care Note  Patient: TORIAN QUINTERO  Procedure(s) Performed: ATTEMPTED ESOPHAGOGASTRODUODENOSCOPY (EGD) WITH PROPOFOL (UNABLE TO PERFORM PERCUTANEOUS ENDOSCOPIC GASTROSTOMY TUBE REPLACEMENT) (N/A )  Patient Location: PACU  Anesthesia Type:General  Level of Consciousness: awake, alert  and oriented  Airway & Oxygen Therapy: Patient Spontanous Breathing and Patient connected to face mask oxygen  Post-op Assessment: Report given to RN and Post -op Vital signs reviewed and stable  Post vital signs: Reviewed and stable  Last Vitals:  Vitals Value Taken Time  BP 151/106 08/11/19 1437  Temp    Pulse 84 08/11/19 1438  Resp 15 08/11/19 1438  SpO2 99 % 08/11/19 1438  Vitals shown include unvalidated device data.  Last Pain:  Vitals:   08/11/19 1137  TempSrc: Oral  PainSc: 6       Patients Stated Pain Goal: 6 (07/13/17 7588)  Complications: No complications documented.

## 2019-08-11 NOTE — Telephone Encounter (Signed)
s/w wife patient is in the hospital his feeding tube came out and she will call us back when he gets out of the hospital or we can call her Thursday and check on them.

## 2019-08-11 NOTE — ED Notes (Signed)
Report given to day surgery

## 2019-08-11 NOTE — Progress Notes (Signed)
59 y.o. male at [facility]. History of laryngeal carcinoma s/p laparoscopic G tube placement on 2.22.21 - 18 Fr. Patient presented to the ED at [facility] on 8.6.21 with G tube dislodgement. Tube was exchanged for 20 Fr at that time. Patient returned to  The ED today on 8.9.21 and the G tube came out over night while sleeping.   Attempt to replace the G tube via endoscopy was unsuccessful.   IR was consulted for a replacement possible new gastrostomy tube placement. Case has been reviewed and procedure approved by Dr. Dyke Maes.  Patient tentatively scheduled for 8.9.21.  Team instructed to: Keep Patient to be NPO after midnight Hold prophylactic anticoagulation 24 hours prior to scheduled procedure. Patient will need to have transportation coordinated to arrive @ [facility] at 11:00  This was communicated directly to the PACU RN. Patient will need to be seen and consented upon arrival for consent and ASA

## 2019-08-11 NOTE — Anesthesia Preprocedure Evaluation (Signed)
Anesthesia Evaluation  Patient identified by MRN, date of birth, ID band Patient awake    Reviewed: Allergy & Precautions, H&P , NPO status , Patient's Chart, lab work & pertinent test results, reviewed documented beta blocker date and time   Airway Mallampati: Trach  TM Distance: >3 FB Neck ROM: full    Dental no notable dental hx.    Pulmonary pneumonia, resolved, former smoker,    Pulmonary exam normal breath sounds clear to auscultation       Cardiovascular Exercise Tolerance: Good hypertension, + CAD   Rhythm:regular Rate:Normal     Neuro/Psych PSYCHIATRIC DISORDERS Anxiety CVA, No Residual Symptoms    GI/Hepatic Neg liver ROS, hiatal hernia, GERD  Medicated,  Endo/Other  negative endocrine ROS  Renal/GU negative Renal ROS  negative genitourinary   Musculoskeletal   Abdominal   Peds  Hematology negative hematology ROS (+)   Anesthesia Other Findings Head/neck cancer, supraepiglottis  Reproductive/Obstetrics negative OB ROS                             Anesthesia Physical Anesthesia Plan  ASA: IV and emergent  Anesthesia Plan: General   Post-op Pain Management:    Induction:   PONV Risk Score and Plan: Ondansetron  Airway Management Planned:   Additional Equipment:   Intra-op Plan:   Post-operative Plan:   Informed Consent: I have reviewed the patients History and Physical, chart, labs and discussed the procedure including the risks, benefits and alternatives for the proposed anesthesia with the patient or authorized representative who has indicated his/her understanding and acceptance.     Dental Advisory Given  Plan Discussed with: CRNA  Anesthesia Plan Comments:         Anesthesia Quick Evaluation

## 2019-08-11 NOTE — H&P (Signed)
[name] is an 59 y.o. male.   Chief Complaint: PEG tube dislodgment HPI: Patient is a 59 year old white male status post laparoscopic gastrostomy tube placement earlier this year due to cancer of the larynx who presented to the emergency room several days ago with dislodgment of that tube.  It was replaced but that tube dislodged yesterday afternoon.  Apparently the balloon had deflated.  Attempts in the emergency room were made to replace the gastrostomy tube yesterday evening.  I was consulted this morning for gastrostomy tube replacement.  I could not access the hole at bedside.  The patient now comes for endoscopic placement.  Past Medical History:  Diagnosis Date  . Anxiety   . Arthritis    back  . Bradycardia    a. During 11/2012 adm: lopressor decreased.  . Cancer of larynx ([place])   . Carotid artery stenosis   . 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 Coronary artery disease    a. Diag cath 10/2012 for CP/abnormal nuc -> planned PCI s/p LAD atherectomy/DES placement 11/05/12.  Marland Kitchen GERD (gastroesophageal reflux disease)   . History of hiatal hernia   . Hypercholesteremia   . Hypertension   . Stroke ([place]) 07/31/2018   bilateral vertebrobasilar occlusion; "left side weaker thanit was before."    Past Surgical History:  Procedure Laterality Date  . CARDIAC CATHETERIZATION  10/29/2012  . CORONARY ANGIOPLASTY WITH STENT PLACEMENT  11/05/2012  . DIRECT LARYNGOSCOPY N/A 01/02/2019   Procedure: MICRO DIRECT LARYNGOSCOPY BIOPSY OF LARYNGEAL MASS;  Surgeon: [name], MD;  Location: [facility];  Service: ENT;  Laterality: N/A;  . HEMORRHOID SURGERY  ~ 2010  . INSERTION OF MESH N/A 06/02/2015   Procedure: INSERTION OF MESH;  Surgeon: [name], MD;  Location: [facility];  Service: General;  Laterality: N/A;  . IR ANGIO INTRA EXTRACRAN SEL COM CAROTID INNOMINATE BILAT MOD SED  08/02/2018  . IR ANGIO VERTEBRAL SEL  VERTEBRAL BILAT MOD SED  08/02/2018  . LAPAROSCOPIC INSERTION GASTROSTOMY TUBE N/A 02/24/2019   Procedure: LAPAROSCOPIC INSERTION GASTROSTOMY TUBE;  Surgeon: [name], MD;  Location: [facility];  Service: General;  Laterality: N/A;  . LEFT HEART CATHETERIZATION WITH CORONARY ANGIOGRAM N/A 10/30/2012   Procedure: LEFT HEART CATHETERIZATION WITH CORONARY ANGIOGRAM;  Surgeon: [name], MD;  Location: [facility];  Service: Cardiovascular;  Laterality: N/A;  . MULTIPLE EXTRACTIONS WITH ALVEOLOPLASTY N/A 02/18/2019   Procedure: Extraction of tooth #'s 5-7, 10,11,14,20-29, 31 and 32 with alveoloplasty and maxiillary left buccal exostoses reductions;  Surgeon: [name], [name];  Location: [facility];  Service: Oral Surgery;  Laterality: N/A;  . PERCUTANEOUS CORONARY ROTOBLATOR INTERVENTION (PCI-R) N/A 11/05/2012   Procedure: PERCUTANEOUS CORONARY ROTOBLATOR INTERVENTION (PCI-R);  Surgeon: [name], MD;  Location: [facility];  Service: Cardiovascular;  Laterality: N/A;  . TRACHEOSTOMY TUBE PLACEMENT N/A 02/18/2019   Procedure: Tracheostomy;  Surgeon: [name], MD;  Location: [facility];  Service: ENT;  Laterality: N/A;  . UMBILICAL HERNIA REPAIR N/A 06/02/2015   Procedure: UMBILICAL HERNIORRHAPHY WITH MESH;  Surgeon: [name], MD;  Location: [facility];  Service: General;  Laterality: N/A;    Family History  Problem Relation Age of Onset  . Hypertension Mother   . Heart disease Father   . Hypertension Father   . Heart disease Sister   . Hypertension Maternal Grandmother   . Hypertension Maternal  Grandfather   . Hypertension Paternal Grandmother   . Hypertension Paternal Grandfather    Social History:  reports that he quit smoking about 12 years ago. His smoking use included cigarettes. He started smoking about 41 years ago. He has a 96.00 pack-year smoking history. He has quit using smokeless tobacco.  His smokeless tobacco use included chew. He reports current alcohol use of about 12.0  standard drinks of alcohol per week. He reports current drug use. Drug: Marijuana.  Allergies:  Allergies  Allergen Reactions  . Duloxetine Other (See Comments)    Caused depression  . Prednisone Other (See Comments)    Chest pain    (Not in a hospital admission)   Results for orders placed or performed during the hospital encounter of 08/10/19 (from the past 48 hour(s))  SARS Coronavirus 2 by RT PCR (hospital order, performed in [facility] hospital lab) Nasopharyngeal Nasopharyngeal Swab     Status: None   Collection Time: 08/10/19  3:04 PM   Specimen: Nasopharyngeal Swab  Result Value Ref Range   SARS Coronavirus 2 NEGATIVE NEGATIVE    Comment: (NOTE) SARS-CoV-2 target nucleic acids are NOT DETECTED.  The SARS-CoV-2 RNA is generally detectable in upper and lower respiratory specimens during the acute phase of infection. The lowest concentration of SARS-CoV-2 viral copies this assay can detect is 250 copies / mL. A negative result does not preclude SARS-CoV-2 infection and should not be used as the sole basis for treatment or other patient management decisions.  A negative result may occur with improper specimen collection / handling, submission of specimen other than nasopharyngeal swab, presence of viral mutation(s) within the areas targeted by this assay, and inadequate number of viral copies (<250 copies / mL). A negative result must be combined with clinical observations, patient history, and epidemiological information.  Fact Sheet for Patients:   StrictlyIdeas.no  Fact Sheet for Healthcare Providers: BankingDealers.co.za  This test is not yet approved or  cleared by the [place] FDA and has been authorized for detection and/or diagnosis of SARS-CoV-2 by FDA under an Emergency Use Authorization (EUA).  This EUA will remain in effect (meaning this test can be used) for the duration of the COVID-19 declaration under  Section 564(b)(1) of the Act, 21 U.S.C. section 360bbb-3(b)(1), unless the authorization is terminated or revoked sooner.  Performed at [facility], [address], [place], [place] [address]   CBC with Differential     Status: Abnormal   Collection Time: 08/10/19  3:38 PM  Result Value Ref Range   WBC 7.5 4.0 - 10.5 K/uL   RBC 3.97 (L) 4.22 - 5.81 MIL/uL   Hemoglobin 12.1 (L) 13.0 - 17.0 g/dL   HCT 37.4 (L) 39 - 52 %   MCV 94.2 80.0 - 100.0 fL   MCH 30.5 26.0 - 34.0 pg   MCHC 32.4 30.0 - 36.0 g/dL   RDW 13.7 11.5 - 15.5 %   Platelets 314 150 - 400 K/uL   nRBC 0.0 0.0 - 0.2 %   Neutrophils Relative % 74 %   Neutro Abs 5.6 1.7 - 7.7 K/uL   Lymphocytes Relative 14 %   Lymphs Abs 1.0 0.7 - 4.0 K/uL   Monocytes Relative 9 %   Monocytes Absolute 0.7 0 - 1 K/uL   Eosinophils Relative 3 %   Eosinophils Absolute 0.2 0 - 0 K/uL   Basophils Relative 0 %   Basophils Absolute 0.0 0 - 0 K/uL   Immature Granulocytes 0 %  Abs Immature Granulocytes 0.01 0.00 - 0.07 K/uL    Comment: Performed at [facility], [address] [place], [place] [address]  Basic metabolic panel     Status: Abnormal   Collection Time: 08/10/19  3:38 PM  Result Value Ref Range   Sodium 138 135 - 145 mmol/L   Potassium 3.8 3.5 - 5.1 mmol/L   Chloride 101 98 - 111 mmol/L   CO2 25 22 - 32 mmol/L   Glucose, Bld 106 (H) 70 - 99 mg/dL    Comment: Glucose reference range applies only to samples taken after fasting for at least 8 hours.   BUN 14 6 - 20 mg/dL   Creatinine, Ser 0.94 0.61 - 1.24 mg/dL   Calcium 9.9 8.9 - 10.3 mg/dL   GFR calc non Af Amer >60 >60 mL/min   GFR calc Af Amer >60 >60 mL/min   Anion gap 12 5 - 15    Comment: Performed at [facility], [address], [place],  [address]   No results found.  Review of Systems  Constitutional: Negative.   HENT: Positive for trouble swallowing.   Eyes: Negative.   Respiratory: Negative for apnea, wheezing and stridor.   Cardiovascular: Negative.    Gastrointestinal: Negative.   Endocrine: Negative.   Genitourinary: Negative.   Musculoskeletal: Negative.   Allergic/Immunologic: Positive for immunocompromised state.  Neurological: Negative.   Hematological: Negative.   Psychiatric/Behavioral: Negative.     Blood pressure (!) 151/83, pulse 78, temperature 98.7 F (37.1 C), temperature source Oral, resp. rate 17, height 6' (1.829 m), weight 68.9 kg, SpO2 96 %. Physical Exam Vitals reviewed.  Constitutional:      Appearance: Normal appearance.  HENT:     Head: Normocephalic and atraumatic.  Cardiovascular:     Rate and Rhythm: Normal rate and regular rhythm.     Heart sounds: Normal heart sounds. No murmur heard.  No friction rub. No gallop.   Pulmonary:     Effort: Pulmonary effort is normal. No respiratory distress.     Breath sounds: Normal breath sounds. No stridor. No wheezing, rhonchi or rales.     Comments: Tracheostomy in place. Abdominal:     General: Abdomen is flat. Bowel sounds are normal. There is no distension.     Palpations: Abdomen is soft. There is no mass.     Tenderness: There is no abdominal tenderness. There is no guarding or rebound.     Hernia: No hernia is present.     Comments: Granulated PEG site in left upper quadrant.  Skin:    General: Skin is warm and dry.  Neurological:     Mental Status: He is alert and oriented to person, place, and time.      Assessment/Plan Impression: PEG tube dislodgment Plan: Patient be taken to the operating room for an EGD with PEG.  The risks and benefits of the procedure including bleeding, infection, and bowel injury were fully explained to the patient, who gave informed consent.  [name], MD 08/11/2019, 10:27 AM

## 2019-08-11 NOTE — Interval H&P Note (Signed)
History and Physical Interval Note:  08/11/2019 1:09 PM  [name]  has presented today for surgery, with the diagnosis of PEG dislodgement.  The various methods of treatment have been discussed with the patient and family. After consideration of risks, benefits and other options for treatment, the patient has consented to  Procedure(s): PERCUTANEOUS ENDOSCOPIC GASTROSTOMY (PEG) REPLACEMENT (N/A) ESOPHAGOGASTRODUODENOSCOPY (EGD) WITH PROPOFOL (N/A) as a surgical intervention.  The patient's history has been reviewed, patient examined, no change in status, stable for surgery.  I have reviewed the patient's chart and labs.  Questions were answered to the patient's satisfaction.     [name]

## 2019-08-11 NOTE — Progress Notes (Signed)
Patient Demographics:    [name], is a 59 y.o. male, DOB - 24-Apr-1960, HKV:[id number]  Admit date - 08/10/2019   Admitting Physician Dax Murguia Denton Brick, MD  Outpatient Primary MD for the patient is [name], MD  LOS - 0   Chief Complaint  Patient presents with  . Other    feeding tube problem        Subjective:    [name] today has no fevers, no emesis,  No chest pain,   -Resting comfortably, no new complaints  Assessment  & Plan :    Principal Problem:   PEG tube malfunction [facility]) Active Problems:   Hypertension   Coronary artery disease   Stroke [facility])   Malignant neoplasm of supraglottis (HCC)   Hx of tracheostomy   PEG (percutaneous endoscopic gastrostomy) adjustment/replacement/removal [facility])  Brief Summary 59 year old with past medical history relevant for malignant neoplasm of supraglottis, status post tracheostomy, status post prior chemotherapy and radiation; dysphagia status post PEG tube placement, H/o CAD, s/p  PCI with stenting in 2014, history of nonhemorrhagic CVA, chronic lower back pain, essential hypertension, HLD admitted on 08/10/2019 with PEG malfunction/PEG fell out  -patient had insertion of PEG 02/24/19 by general surgeon Dr. [name].  An 80 French tube was placed.  It was inserted due to chemotherapy for laryngeal cancer.  Patient was not a candidate for IR gastrostomy tube placement due to abnormal anatomy- as per imaging his stomach lied behind the transverse colon. -- Peg was changed in the ED 08/08/19, and the postprocedure film confirmed placement..  Patient was able to use his PEG tube until a.m. of 08/10/2019 -EDP unable to replace PEG on 08/10/19 and general surgeon Dr. [name] unable to place PEG with EGD on 08/11/19 due to esophageal strictures in the setting of prior neck radiation  A/p 1) PEG malfunction--- please see brief summary above for  details -Discussed with Dr. Markus Daft and Dr. Aletta Edouard from interventional radiology at [facility] on 08/11/19 --- Plan is to get a CT abdomen to further evaluate patient's intra-abdominal anatomy -patient will be transferred to [facility] for further evaluation for PEG tube placement by IR Versus General surgery depending on findings of CT abdomen with regards to patient's anatomy  2)FEN-continue D5 solution for now, use  Accu-Cheks as ordered  3) History of laryngeal cancer status post radiation and chemotherapy--- status post tracheostomy with trach collar --- no respiratory distress -Patient sees Dr. Delton Coombes,   4)H/o CVA--- May restart aspirin and lipitor when PEG tube use resumes  5)HTN--according to patient's wife PTA was not taking any antihypertensives, use IV labetalol as needed  6)H/o CAD-   status post PCI with stenting in 2014,  Ok to Resume aspirin, lipitor once PEG tube use resumes  Disposition/Need for in-Hospital Stay- patient unable to be discharged at this time due to --  transferred to [facility] for further evaluation for PEG tube placement by IR Versus General surgery depending on findings of CT abdomen with regards to patient's anatomy  Status is: Inpatient  Remains inpatient appropriate because: transferred to [facility] for further evaluation for PEG tube placement by IR Versus General surgery depending on findings of CT abdomen with regards to patient's anatomy  Disposition: The patient is from: Home              Anticipated d/c is to: Home              Anticipated d/c date is: 1 day              Patient currently is not medically stable to d/c. Barriers: Not Clinically Stable-  transferred to [facility] for further evaluation for PEG tube placement by IR Versus General surgery depending on findings of CT abdomen with regards to patient's anatomy  Code Status : Full Code  Family Communication:   (patient is alert, awake and coherent)   Consults  :   Gen Surg/IR  DVT Prophylaxis  :   - SCDs   Lab Results  Component Value Date   PLT 314 08/10/2019    Inpatient Medications  Scheduled Meds: . chlorhexidine  15 mL Mouth/Throat Once   Or  . mouth rinse  15 mL Mouth Rinse Once  . Chlorhexidine Gluconate Cloth  6 each Topical Once   And  . Chlorhexidine Gluconate Cloth  6 each Topical Once   Continuous Infusions: . dextrose 5 % and 0.9% NaCl Stopped (08/11/19 0131)  . lactated ringers     PRN Meds:.fentaNYL (SUBLIMAZE) injection, labetalol, lidocaine (PF), [MAR Hold]  morphine injection, [MAR Hold] ondansetron **OR** [MAR Hold] ondansetron (ZOFRAN) IV, simethicone susp in sterile water 1000 mL irrigation    Anti-infectives (From admission, onward)   Start     Dose/Rate Route Frequency Ordered Stop   08/12/19 1300  ceFAZolin (ANCEF) IVPB 2g/100 mL premix        2 g 200 mL/hr over 30 Minutes Intravenous On call to O.R. 08/11/19 1205 08/11/19 1436        Objective:   Vitals:   08/11/19 1439 08/11/19 1445 08/11/19 1500 08/11/19 1515  BP: (!) 177/115 (!) 175/112 (!) 171/104 (!) 145/88  Pulse: 84 84 76 73  Resp: 15 18 19 13   Temp:      TempSrc:      SpO2:      Weight:      Height:        Wt Readings from Last 3 Encounters:  08/10/19 68.9 kg  08/08/19 68.5 kg  06/24/19 71.7 kg    Intake/Output Summary (Last 24 hours) at 08/11/2019 1548 Last data filed at 08/11/2019 1427 Gross per 24 hour  Intake 917.14 ml  Output --  Net 917.14 ml   Physical Exam Gen:- Awake Alert,  In no apparent distress  HEENT:- [place].AT, No sclera icterus Neck- Trache with Trach collar- on RA Lungs- fair symmetrical air movement, No wheezing  CV- S1, S2 normal, regular  Abd-  +ve B.Sounds, Abd Soft, No tenderness, Granulating PEG Tube site Extremity/Skin:- No  edema, pedal pulses present  Psych-affect is appropriate, oriented x3 Neuro-generalized weakness with old/residual neuro deficits,  no new focal deficits, no tremors   Data Review:    Micro Results Recent Results (from the past 240 hour(s))  SARS Coronavirus 2 by RT PCR (hospital order, performed in [facility] hospital lab) Nasopharyngeal Nasopharyngeal Swab     Status: None   Collection Time: 08/10/19  3:04 PM   Specimen: Nasopharyngeal Swab  Result Value Ref Range Status   SARS Coronavirus 2 NEGATIVE NEGATIVE Final    Comment: (NOTE) SARS-CoV-2 target nucleic acids are NOT DETECTED.  The SARS-CoV-2 RNA is generally detectable in upper and lower respiratory specimens during the acute phase of infection.  The lowest concentration of SARS-CoV-2 viral copies this assay can detect is 250 copies / mL. A negative result does not preclude SARS-CoV-2 infection and should not be used as the sole basis for treatment or other patient management decisions.  A negative result may occur with improper specimen collection / handling, submission of specimen other than nasopharyngeal swab, presence of viral mutation(s) within the areas targeted by this assay, and inadequate number of viral copies (<250 copies / mL). A negative result must be combined with clinical observations, patient history, and epidemiological information.  Fact Sheet for Patients:   StrictlyIdeas.no  Fact Sheet for Healthcare Providers: BankingDealers.co.za  This test is not yet approved or  cleared by the [place] FDA and has been authorized for detection and/or diagnosis of SARS-CoV-2 by FDA under an Emergency Use Authorization (EUA).  This EUA will remain in effect (meaning this test can be used) for the duration of the COVID-19 declaration under Section 564(b)(1) of the Act, 21 U.S.C. section 360bbb-3(b)(1), unless the authorization is terminated or revoked sooner.  Performed at [facility], [address], [place], [place] [address]     Radiology Reports DG OP Swallowing Func-Medicare/Speech Path  Result Date: 07/28/2019 [facility] [address] [place], [place], [address] Phone: [phone]   Fax:  [phone] Modified Barium Swallow Patient Details Name: [name] MRN: [id number] Date of Birth: 16-May-1960 No data recorded Encounter Date: 07/24/2019  End of Session - 07/24/19 1706   Visit Number 3   Number of Visits 6   Date for SLP Re-Evaluation 08/28/19   Authorization Type [employer]   no deductible, OOP $900, copay $10, 30 visit limit, auth required  SLP Start Time 1140   SLP Stop Time  1207   SLP Time Calculation (min) 27 min   Activity Tolerance Patient tolerated treatment well     Past Medical History: Diagnosis Date . Anxiety  . Arthritis   back . Bradycardia   a. During 11/2012 adm: lopressor decreased. . Cancer of larynx ([place])  . Carotid artery stenosis  . 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 Coronary artery disease   a. Diag cath 10/2012 for CP/abnormal nuc -> planned PCI s/p LAD atherectomy/DES placement 11/05/12. Marland Kitchen GERD (gastroesophageal reflux disease)  . History of hiatal hernia  . Hypercholesteremia  . Hypertension  . Stroke ([place]) 07/31/2018  bilateral vertebrobasilar occlusion; "left side weaker thanit was before." Past Surgical History: Procedure Laterality Date . CARDIAC CATHETERIZATION  10/29/2012 . CORONARY ANGIOPLASTY WITH STENT PLACEMENT  11/05/2012 . DIRECT LARYNGOSCOPY N/A 01/02/2019  Procedure: MICRO DIRECT LARYNGOSCOPY BIOPSY OF LARYNGEAL MASS;  Surgeon: [name], MD;  Location: [place] OR;  Service: ENT;  Laterality: N/A; . HEMORRHOID SURGERY  ~ 2010 . INSERTION OF MESH N/A 06/02/2015  Procedure: INSERTION OF MESH;  Surgeon: [name], MD;  Location: [facility];  Service: General;  Laterality: N/A; . IR ANGIO INTRA EXTRACRAN SEL COM CAROTID INNOMINATE BILAT MOD SED  08/02/2018 . IR ANGIO VERTEBRAL SEL VERTEBRAL BILAT MOD SED  08/02/2018 . LAPAROSCOPIC INSERTION GASTROSTOMY TUBE N/A 02/24/2019  Procedure:  LAPAROSCOPIC INSERTION GASTROSTOMY TUBE;  Surgeon: [name], MD;  Location: [place];  Service: General;  Laterality: N/A; . LEFT HEART CATHETERIZATION WITH CORONARY ANGIOGRAM N/A 10/30/2012  Procedure: LEFT HEART CATHETERIZATION WITH CORONARY ANGIOGRAM;  Surgeon: [name], MD;  Location: [place] CATH LAB;  Service:  Cardiovascular;  Laterality: N/A; . MULTIPLE EXTRACTIONS WITH ALVEOLOPLASTY N/A 02/18/2019  Procedure: Extraction of tooth #'s 5-7, 10,11,14,20-29, 31 and 32 with alveoloplasty and maxiillary left buccal exostoses reductions;  Surgeon: [name], [name];  Location: [place];  Service: Oral Surgery;  Laterality: N/A; . PERCUTANEOUS CORONARY ROTOBLATOR INTERVENTION (PCI-R) N/A 11/05/2012  Procedure: PERCUTANEOUS CORONARY ROTOBLATOR INTERVENTION (PCI-R);  Surgeon: [name], MD;  Location: [facility] CATH LAB;  Service: Cardiovascular;  Laterality: N/A; . TRACHEOSTOMY TUBE PLACEMENT N/A 02/18/2019  Procedure: Tracheostomy;  Surgeon: [name], MD;  Location: [place];  Service: ENT;  Laterality: N/A; . UMBILICAL HERNIA REPAIR N/A 06/02/2015  Procedure: UMBILICAL HERNIORRHAPHY WITH MESH;  Surgeon: [name], MD;  Location: [facility];  Service: General;  Laterality: N/A; There were no vitals filed for this visit.  Subjective Assessment - 07/24/19 1657   Subjective "Just water"   Special Tests MBSS   Currently in Pain? No/denies      General - 07/24/19 1657    General Information  Date of Onset 01/02/19   HPI  [name] is a 59 yo male with  supraglottic carcinoma. Laryngoscopy and biopsy on 01/02/2019, trach placed on 02/18/2019. PET scan on 01/27/2019 showed hypermetabolic laryngeal lesion with hypermetabolic cervical metastatic disease bilaterally. Weekly cisplatin and radiation started on 03/07/2019, many interruptions due to hospitalizations. Patient completed chemotherapy ( last weekly cisplatin on 05/15/2019) and radiation (03/07/2019 through 05/08/2019). Pt reportedly had a follow appointment with ENT, Dr.  Benjamine Mola July 1, but no records found. He also has a history of a bilateral pontine stroke in 07/2018. He last saw Dr. Enrique Sack in June 2021 and a bony spicule in the area of #20 on the buccal aspect that is tender to palpation was identified and will ideally need aveoloplasty. He has a PEG and only drinks water by mouth at this time. He was referred by Dr. Eppie Gibson for MBSS.  Type of Study MBS-Modified Barium Swallow Study   Diet Prior to this Study NPO;PEG tube   Temperature Spikes Noted No   Respiratory Status Room air   History of Recent Intubation No   Behavior/Cognition Alert;Cooperative;Pleasant mood   Oral Cavity Assessment Within Functional Limits   Oral Cavity - Dentition Missing dentition;Poor condition   he reportedly saw the oral surgeon in [place], [place] yesterday with plans for extractions  Vision Functional for self feeding   Self-Feeding Abilities Able to feed self   Patient Positioning Upright in chair   Baseline Vocal Quality Normal   Volitional Cough Strong   Volitional Swallow Able to elicit   Anatomy Within functional limits   Pharyngeal Secretions Not observed secondary MBS      Oral Preparation/Oral Phase - 07/24/19 1658    Oral Preparation/Oral Phase  Oral Phase Impaired    Oral - Nectar  Oral - Nectar Teaspoon Decreased bolus cohesion    Oral - Thin  Oral - Thin Teaspoon Decreased bolus cohesion   Oral - Thin Cup Decreased bolus cohesion    Oral - Solids  Oral - Puree Decreased bolus cohesion    Electrical stimulation - Oral Phase  Was Electrical Stimulation Used No      Pharyngeal Phase - 07/24/19 1701    Pharyngeal Phase  Pharyngeal Phase Impaired    Pharyngeal - Nectar  Pharyngeal- Nectar Teaspoon Swallow initiation at vallecula;Reduced pharyngeal peristalsis;Reduced airway/laryngeal closure;Penetration/Apiration after swallow;Moderate aspiration;Pharyngeal residue - cp segment   Pharyngeal Material enters airway, passes BELOW cords without attempt by patient to eject out (  silent  aspiration);Material enters airway, passes BELOW cords and not ejected out despite cough attempt by patient    Pharyngeal - Thin  Pharyngeal- Thin Teaspoon Swallow initiation at vallecula;Pharyngeal residue - cp segment   Pharyngeal- Thin Cup Swallow initiation at vallecula;Swallow initiation at pyriform sinus;Reduced pharyngeal peristalsis;Penetration/Aspiration during swallow;Penetration/Apiration after swallow;Trace aspiration;Moderate aspiration;Pharyngeal residue - cp segment   Pharyngeal Material enters airway, passes BELOW cords and not ejected out despite cough attempt by patient;Material enters airway, passes BELOW cords without attempt by patient to eject out (silent aspiration)    Pharyngeal - Solids  Pharyngeal- Puree Swallow initiation at vallecula;Reduced pharyngeal peristalsis;Reduced epiglottic inversion;Reduced tongue base retraction;Pharyngeal residue - valleculae;Pharyngeal residue - pyriform;Penetration/Apiration after swallow   Pharyngeal Material enters airway, passes BELOW cords and not ejected out despite cough attempt by patient;Material enters airway, passes BELOW cords without attempt by patient to eject out (silent aspiration)    Electrical Stimulation - Pharyngeal Phase  Was Electrical Stimulation Used No      Cricopharyngeal Phase - 07/24/19 1704    Cervical Esophageal Phase  Cervical Esophageal Phase --   narrowing near C4-5     SLP Short Term Goals - 07/24/19 1707    SLP SHORT TERM GOAL #1  Title Pt will demonstrate safe and efficient consumption of self regulated regular textures with thin liquids with use of strategies as needed.   Baseline Currently consuming a variety of regular textures and all liquids   Time 3   Period Months   Status On-going   Target Date 08/28/19    SLP SHORT TERM GOAL #2  Title Pt will complete pharyngeal swallowing exercises as assigned with use of written cue after initial introduction/model from SLP   Baseline Introduced this date   Time 3   Period Months    Status On-going   Target Date 08/28/19    SLP SHORT TERM GOAL #3  Title Pt will verbalize 3 signs/symptoms of aspiration pneumonia with min assist.   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 Pt demonstrated seemingly normal pharyngeal swallow response with initial tsp presentations of thin barium (vallecular trigger) on two trials, however ensuing trials via cup sip thin, tsp NTL, puree, and back to tsp thin yielded penetration during and aspiration after the swallow variably sensed (cough) and unsensed. Pt with blunted/bulbous epiglottis from post radiation effects, reduced epiglottic deflection with heavier bolus (puree), reduced pharyngeal constriction, reduced laryngeal closure, narrowing near PE segment resulting in stasis of bolus near PES and pyfiroms and aspiration after the swallow from residuals spilling into larynx across consistencies. Question whether the narrowing in pharynx near C4-5 is also due to radiation changes (may benefit from dilation?). Recommend primary source of nutrition via PEG due to high risk for aspiration and continued dysphagia therapy with a focus on Pt taking single tsp presentations of thin water after oral care. Pt should try to wear his PMSV during all waking hours. SLP will try to get records from his most recent ENT appointment.   Speech Therapy  Frequency 1x /week   1-2x per month after chemoradiation  Duration 4 weeks   3 months  Treatment/Interventions Aspiration precaution training;SLP instruction and feedback;Pharyngeal strengthening exercises;Compensatory  strategies;Patient/family education;Compensatory techniques;Diet toleration management by SLP   MBSS if indicated  Potential to Achieve Goals Good   SLP Home Exercise Plan Pt will complete HEP as assigned to facilitate carryover of treatment strategies and techniques in home environment with written cues   Consulted and Agree with Plan of Care Patient     Patient will benefit from skilled therapeutic intervention in order to improve the following deficits and impairments:  Dysphagia, oropharyngeal phase  Recommendations/Treatment - 07/24/19 1704    Swallow Evaluation Recommendations  Recommended Consults Consider ENT evaluation   SLP Diet Recommendations NPO   Mare Ferrari free water protocol  Liquid Administration via Spoon   Medication Administration Via alternative means   Supervision Patient able to self feed   Compensations Small sips/bites;Multiple dry swallows after each bite/sip;Hard cough after swallow   Postural Changes Seated upright at 90 degrees;Remain upright for at least 30 minutes after feeds/meals      Prognosis - 07/24/19 1705    Prognosis  Prognosis for Safe Diet Advancement Guarded   Barriers to Reach Goals Severity of deficits    Individuals Consulted  Consulted and Agree with Results and Recommendations Patient   Report Sent to  Referring physician     Problem List Patient Active Problem List  Diagnosis Date Noted . HCAP (healthcare-associated pneumonia) 04/10/2019 . Febrile illness, acute 04/08/2019 . Malnutrition of moderate degree 04/03/2019 . GI bleed 04/01/2019 . Dehydration 03/28/2019 . RLL pneumonia 03/17/2019 . Hx of tracheostomy 02/18/2019 . Head and neck cancer ([place]) 02/18/2019 . Malignant neoplasm of supraglottis ([place]) 01/22/2019 . Stroke ([place]) 07/31/2018 . Rotator cuff syndrome of right shoulder 02/20/2013 . Bradycardia 11/06/2012 . Coronary artery disease  . Abnormal finding on cardiovascular stress test 09/25/2012 . Hypertension 09/03/2012 . Chest pain 09/03/2012 . Alcohol abuse, daily  use 09/03/2012 . Hyperlipidemia 09/03/2012 . Hyponatremia 09/03/2012 Thank you, Genene Churn, [facility] 07/24/2019, 5:08 PM [facility] [address] [place], [place], [address] Phone: [phone]   Fax:  [phone] Name: [name] MRN: [id number] Date of Birth: 1960-09-18 CLINICAL DATA:  Dysphagia. Laryngeal cancer post radiation therapy, tracheostomy, PEG tube placement, significant neck swelling EXAM: MODIFIED BARIUM SWALLOW TECHNIQUE: Different consistencies of barium were administered orally to the patient by the Speech Pathologist. Imaging of the pharynx was performed in the lateral projection. The radiologist was present in the fluoroscopy room for this study, providing personal supervision. FLUOROSCOPY TIME:  Fluoroscopy Time:  4 minutes 12 seconds Radiation Exposure Index (if provided by the fluoroscopic device): 22.6 mGy Number of Acquired Spot Images: Multiple fluoroscopic series were saved COMPARISON:  02/21/2019 FINDINGS: With thin barium by teaspoon and cup, week hypopharyngeal muscles are seen with laryngeal penetration and aspiration. Aspiration particularly is identified from piriform sinus residuals. Laryngeal penetration also occurred with nectar consistency with piriform sinus residuals. Delayed initiation of swallow was identified with applesauce consistency with vallecular and piriform sinus residuals. Laryngeal penetration aspiration of the applesauce consistency occurred from the residuals. A swallow of thin barium to help past the applesauce also demonstrated laryngeal penetration aspiration. Patient was able to clear a portion of the aspirated contrast by coughing. IMPRESSION: Significant swallowing dysfunction as above. Please refer to the Speech Pathologists report for complete details and recommendations. Electronically Signed   By: Lavonia Dana M.D.   On:  07/24/2019 12:22   DG ABDOMEN PEG TUBE LOCATION  Result  Date: 08/08/2019 CLINICAL DATA:  Peg tube position. EXAM: ABDOMEN - 1 VIEW COMPARISON:  CT abdomen 02/19/2019 FINDINGS: Peg tube injected with 30 mL Omnipaque 300. Peg tube tip is in the gastric antrum. The balloon is inflated in the antrum. There is contrast in the stomach and duodenum. No extravasation. IMPRESSION: Peg tube in the gastric antrum. Electronically Signed   By: Franchot Gallo M.D.   On: 08/08/2019 17:56     CBC Recent Labs  Lab 08/10/19 1538  WBC 7.5  HGB 12.1*  HCT 37.4*  PLT 314  MCV 94.2  MCH 30.5  MCHC 32.4  RDW 13.7  LYMPHSABS 1.0  MONOABS 0.7  EOSABS 0.2  BASOSABS 0.0    Chemistries  Recent Labs  Lab 08/10/19 1538  NA 138  K 3.8  CL 101  CO2 25  GLUCOSE 106*  BUN 14  CREATININE 0.94  CALCIUM 9.9   ------------------------------------------------------------------------------------------------------------------ No results for input(s): CHOL, HDL, LDLCALC, TRIG, CHOLHDL, LDLDIRECT in the last 72 hours.  Lab Results  Component Value Date   HGBA1C 5.4 08/01/2018   ------------------------------------------------------------------------------------------------------------------ No results for input(s): TSH, T4TOTAL, T3FREE, THYROIDAB in the last 72 hours.  Invalid input(s): FREET3 ------------------------------------------------------------------------------------------------------------------ No results for input(s): VITAMINB12, FOLATE, FERRITIN, TIBC, IRON, RETICCTPCT in the last 72 hours.  Coagulation profile No results for input(s): INR, PROTIME in the last 168 hours.  No results for input(s): DDIMER in the last 72 hours.  Cardiac Enzymes No results for input(s): CKMB, TROPONINI, MYOGLOBIN in the last 168 hours.  Invalid input(s): CK ------------------------------------------------------------------------------------------------------------------ No results found for: BNP   Roxan Hockey M.D on 08/11/2019 at 3:48 PM  Go to  [email or web address] - for contact info  [facility] - Office  [phone]

## 2019-08-11 NOTE — ED Notes (Signed)
Dr. Arnoldo Morale here now to insert PEG tube.

## 2019-08-11 NOTE — Progress Notes (Signed)
1600 wife called and updated that patient would be going back to the ER and transferred to [facility] for interventional radiology procedure scheduled for 1100 tomorrow.   1645 Transferred back to ER in stretcher, communicated with receiving RN in regards to interventional radiology scheduled procedure at 1100 08/12/2019 pt to be NPO after midnight. P

## 2019-08-11 NOTE — Anesthesia Postprocedure Evaluation (Signed)
Anesthesia Post Note  Patient: NEVADA MULLETT  Procedure(s) Performed: ATTEMPTED ESOPHAGOGASTRODUODENOSCOPY (EGD) WITH PROPOFOL (UNABLE TO PERFORM PERCUTANEOUS ENDOSCOPIC GASTROSTOMY TUBE REPLACEMENT) (N/A )  Patient location during evaluation: ICU Anesthesia Type: General Level of consciousness: awake Pain management: pain level controlled Vital Signs Assessment: post-procedure vital signs reviewed and stable Respiratory status: spontaneous breathing Cardiovascular status: blood pressure returned to baseline Postop Assessment: no headache Anesthetic complications: no   No complications documented.   Last Vitals:  Vitals:   08/11/19 1530 08/11/19 1545  BP: (!) 149/99 (!) 177/82  Pulse: 72 72  Resp: 14 (!) 23  Temp:    SpO2:  94%    Last Pain:  Vitals:   08/11/19 1630  TempSrc:   PainSc: 0-No pain                 Louann Sjogren

## 2019-08-12 ENCOUNTER — Ambulatory Visit (HOSPITAL_COMMUNITY): Payer: 59

## 2019-08-12 ENCOUNTER — Inpatient Hospital Stay (HOSPITAL_COMMUNITY): Payer: 59

## 2019-08-12 ENCOUNTER — Encounter (HOSPITAL_COMMUNITY): Payer: Self-pay

## 2019-08-12 ENCOUNTER — Encounter (HOSPITAL_COMMUNITY): Payer: Self-pay | Admitting: Family Medicine

## 2019-08-12 HISTORY — PX: IR REPLACE G-TUBE SIMPLE WO FLUORO: IMG2323

## 2019-08-12 LAB — GLUCOSE, CAPILLARY: Glucose-Capillary: 98 mg/dL (ref 70–99)

## 2019-08-12 MED ORDER — HYDROCODONE-ACETAMINOPHEN 5-325 MG PO TABS: 1.0000 | ORAL_TABLET | ORAL | Status: DC | PRN

## 2019-08-12 MED ORDER — LIDOCAINE VISCOUS HCL 2 % MT SOLN
OROMUCOSAL | Status: AC
  Filled 2019-08-12: qty 15

## 2019-08-12 MED ORDER — PROSOURCE TF PO LIQD
90.0000 mL | Freq: Two times a day (BID) | ORAL | Status: DC
  Administered 2019-08-12 – 2019-08-13 (×2): 90 mL
  Filled 2019-08-12 (×3): qty 90

## 2019-08-12 MED ORDER — OSMOLITE 1.5 CAL PO LIQD
355.0000 mL | Freq: Four times a day (QID) | ORAL | Status: DC
  Administered 2019-08-12 – 2019-08-13 (×2): 355 mL
  Filled 2019-08-12 (×6): qty 474

## 2019-08-12 MED ORDER — DEXTROSE-NACL 5-0.9 % IV SOLN: INTRAVENOUS | Status: AC

## 2019-08-12 MED ORDER — FENTANYL CITRATE (PF) 100 MCG/2ML IJ SOLN
INTRAMUSCULAR | Status: AC
  Filled 2019-08-12: qty 2

## 2019-08-12 MED ORDER — IOHEXOL 300 MG/ML  SOLN
50.0000 mL | Freq: Once | INTRAMUSCULAR | Status: AC | PRN
  Administered 2019-08-12: 10 mL

## 2019-08-12 MED ORDER — LIDOCAINE VISCOUS HCL 2 % MT SOLN
OROMUCOSAL | Status: AC | PRN
  Administered 2019-08-12: 15 mL via OROMUCOSAL

## 2019-08-12 MED ORDER — MIDAZOLAM HCL 2 MG/2ML IJ SOLN
INTRAMUSCULAR | Status: AC
  Filled 2019-08-12: qty 2

## 2019-08-12 MED ORDER — HYDROMORPHONE HCL 1 MG/ML IJ SOLN
1.0000 mg | INTRAMUSCULAR | Status: DC | PRN
  Administered 2019-08-12 – 2019-08-13 (×2): 1 mg via INTRAVENOUS
  Filled 2019-08-12 (×2): qty 1

## 2019-08-12 MED ORDER — ONDANSETRON HCL 4 MG/2ML IJ SOLN: 4.0000 mg | INTRAMUSCULAR | Status: DC | PRN

## 2019-08-12 MED ORDER — FENTANYL CITRATE (PF) 100 MCG/2ML IJ SOLN
INTRAMUSCULAR | Status: AC | PRN
  Administered 2019-08-12: 50 ug via INTRAVENOUS

## 2019-08-12 MED ORDER — MIDAZOLAM HCL 2 MG/2ML IJ SOLN
INTRAMUSCULAR | Status: AC | PRN
  Administered 2019-08-12: 1 mg via INTRAVENOUS

## 2019-08-12 MED ORDER — CEFAZOLIN SODIUM-DEXTROSE 2-4 GM/100ML-% IV SOLN
INTRAVENOUS | Status: AC
  Filled 2019-08-12: qty 100

## 2019-08-12 NOTE — Procedures (Signed)
  Procedure: Gastrostomy placement 56F via existing track   EBL:   minimal Complications:  none immediate  See full dictation in [facility].  Dillard Cannon MD Main # [phone] Pager  [phone]

## 2019-08-12 NOTE — H&P (Addendum)
Chief Complaint: Patient was seen in consultation today for percutaneous gastric tube replacement vs new placement Chief Complaint  Patient presents with  . Other    feeding tube problem   at the request of Dr Denton Brick   Supervising Physician: Arne Cleveland  Patient Status: [facility] - [facility]  History of Present Illness: [name] is a 59 y.o. male   Throat Cancer G tube placed in OR with Dr Ok Anis 02/24/19  Has remained in placed and in use until just 2 days ago Dislodged at home Came to ED - [facility]  Bedside and EGD replacement attempt unsuccessful 08/11/19  Now request for IR to attempt replacement vs new placement     Past Medical History:  Diagnosis Date  . Anxiety   . Arthritis    back  . Bradycardia    a. During 11/2012 adm: lopressor decreased.  . 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 Carotid artery stenosis   . 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 Coronary artery disease    a. Diag cath 10/2012 for CP/abnormal nuc -> planned PCI s/p LAD atherectomy/DES placement 11/05/12.  Marland Kitchen GERD (gastroesophageal reflux disease)   . History of hiatal hernia   . Hypercholesteremia   . Hypertension   . Stroke ([place]) 07/31/2018   bilateral vertebrobasilar occlusion; "left side weaker thanit was before."    Past Surgical History:  Procedure Laterality Date  . CARDIAC CATHETERIZATION  10/29/2012  . CORONARY ANGIOPLASTY WITH STENT PLACEMENT  11/05/2012  . DIRECT LARYNGOSCOPY N/A 01/02/2019   Procedure: MICRO DIRECT LARYNGOSCOPY BIOPSY OF LARYNGEAL MASS;  Surgeon: [name], MD;  [facility];  Service: ENT;  Laterality: N/A;  . ESOPHAGOGASTRODUODENOSCOPY (EGD) WITH PROPOFOL N/A 08/11/2019   Procedure: ATTEMPTED ESOPHAGOGASTRODUODENOSCOPY (EGD) WITH PROPOFOL (UNABLE TO PERFORM PERCUTANEOUS ENDOSCOPIC GASTROSTOMY TUBE REPLACEMENT);  Surgeon: [name], MD;  Location[facility];  Service: Gastroenterology;   Laterality: N/A;  . HEMORRHOID SURGERY  ~ 2010  . INSERTION OF MESH N/A 06/02/2015   Procedure: INSERTION OF MESH;  Surgeon: [name], MD;  [facility]: [facility];  Service: General;  Laterality: N/A;  . IR ANGIO INTRA EXTRACRAN SEL COM CAROTID INNOMINATE BILAT MOD SED  08/02/2018  . IR ANGIO VERTEBRAL SEL VERTEBRAL BILAT MOD SED  08/02/2018  . LAPAROSCOPIC INSERTION GASTROSTOMY TUBE N/A 02/24/2019   Procedure: LAPAROSCOPIC INSERTION GASTROSTOMY TUBE;  Surgeon: [name], MD;  Location: [place];  Service: General;  Laterality: N/A;  . LEFT HEART CATHETERIZATION WITH CORONARY ANGIOGRAM N/A 10/30/2012   Procedure: LEFT HEART CATHETERIZATION WITH CORONARY ANGIOGRAM;  Surgeon: [name], MD;  Location: [place] CATH LAB;  Service: Cardiovascular;  Laterality: N/A;  . MULTIPLE EXTRACTIONS WITH ALVEOLOPLASTY N/A 02/18/2019   Procedure: Extraction of tooth #'s 5-7, 10,11,14,20-29, 31 and 32 with alveoloplasty and maxiillary left buccal exostoses reductions;  Surgeon: [name], [name];  Location: [place];  Service: Oral Surgery;  Laterality: N/A;  . PERCUTANEOUS CORONARY ROTOBLATOR INTERVENTION (PCI-R) N/A 11/05/2012   Procedure: PERCUTANEOUS CORONARY ROTOBLATOR INTERVENTION (PCI-R);  Surgeon: [name], MD;  Location: [facility];  Service: Cardiovascular;  Laterality: N/A;  . TRACHEOSTOMY TUBE PLACEMENT N/A 02/18/2019   Procedure: Tracheostomy;  Surgeon: [name], MD;  Location: [place];  Service: ENT;  Laterality: N/A;  . UMBILICAL HERNIA REPAIR N/A 06/02/2015   Procedure: UMBILICAL HERNIORRHAPHY WITH MESH;  Surgeon: [name], MD;  [facility];  Service: General;  Laterality: N/A;    Allergies: Duloxetine and Prednisone  Medications: Prior to Admission medications   Medication Sig Start Date End Date Taking? Authorizing Provider  Amino Acids-Protein Hydrolys (FEEDING SUPPLEMENT, PRO-STAT SUGAR FREE 64,) LIQD Place 30 mLs into feeding tube 3 (three) times daily with meals. 04/14/19  Yes  Emokpae, Courage, MD  aspirin EC 81 MG tablet Take 325 mg by mouth daily.    Yes [provider]  atorvastatin (LIPITOR) 80 MG tablet Take 1 tablet (80 mg total) by mouth every evening. Patient taking differently: Place 40 mg into feeding tube in the morning and at bedtime.  10/23/17  Yes Herminio Commons, MD  Catheters (ARGYLE SUCTION CATHETER 14FR) MISC 1 Device by Does not apply route daily. 02/25/19  Yes Harold Hedge, MD  diazepam (VALIUM) 10 MG tablet Place 10 mg into feeding tube 3 (three) times daily as needed.  05/28/19  Yes [provider]  famotidine (PEPCID) 20 MG tablet Place 20 mg into feeding tube daily.  04/17/19  Yes [provider]  HYDROcodone-acetaminophen (NORCO) 10-325 MG tablet Take 1 tablet by mouth every 4 (four) hours as needed. 06/02/15  Yes [name], MD  lidocaine (XYLOCAINE) 2 % solution Patient: Mix 1part 2% viscous lidocaine, 1part H20. Swish & swallow 60m of diluted mixture, 34m before meals and at bedtime, up to QID 03/10/19  Yes SqEppie GibsonMD  Nutritional Supplements (FEEDING SUPPLEMENT, OSMOLITE 1.5 CAL,) LIQD Give 2 cartons of osmolite 1.5 at 9:30am, 1:30, 5:30 and 1 carton at 9:30pm.  Flush with 6047mf water before and 120m35mter each feeding. Meets 100% of patient needs. Send bolus supplies. Patient taking differently: Place 1,000 mLs into feeding tube in the morning, at noon, in the evening, and at bedtime. Give 2 cartons of osmolite 1.5 at 9:30am, 1:30, 5:30 and 1 carton at 9:30pm.  Flush with 60ml28mwater before and 120ml 20mr each feeding. Meets 100% of patient needs. Send bolus supplies. 03/19/19  Yes SquireEppie GibsonNutritional Supplements (PROSOURCE TF) LIQD Give 30ml 267mes per day via feeding tube.  Mix with 60ml of74mer.  Flush tube with 60ml of 54mr, then place prosource and water mixture in tube and flush with 60ml of w2m after. Patient taking differently: Place 30 mLs into feeding tube in the morning and  at bedtime. Mix with 60ml of wa53m  Flush tube with 60ml of wat47mthen place prosource and water mixture in tube and flush with 60ml of wate44mter. 03/19/19  Yes Squire, SarahEppie Gibsonole (PRILOSEC) 20 MG capsule Take 20 mg by mouth daily.  07/22/18  Yes [provider]  prochlorperazine (COMPAZINE) 10 MG tablet TAKE 1 TABLET(10 MG) BY MOUTH EVERY 6 HOURS AS NEEDED FOR NAUSEA Patient taking differently: Place 10 mg into feeding tube every 6 (six) hours as needed for nausea.  06/30/19  Yes Lockamy, Randi L, NP-C  Tracheostomy Care KIT 1 kit by Does not apply route daily. 02/25/19  Yes Segal, Jared Harold Hedgeor Irrigation, Sterile (FREE WATER) SOLN Place 100 mLs into feeding tube See admin instructions. Please Flush PEG tube with 1 ounce (30 ml) before giving to feed, and then flush with 3 ounces (90ml) after g41mg to feed--- up to 4 times a day 04/14/19  Yes Emokpae, Courage, MD  acetaminophen (TYLENOL) 325 MG tablet Take 2 tablets (650 mg total) by mouth every 6 (six) hours as needed for  mild pain, fever or headache (or Fever >/= 101). 04/14/19   Emokpae, Courage, MD  diphenoxylate-atropine (LOMOTIL) 2.5-0.025 MG tablet Place 2 tablets into feeding tube 4 (four) times daily as needed for diarrhea or loose stools.  04/24/19   [provider]  nitroGLYCERIN (NITROSTAT) 0.4 MG SL tablet Place 1 tablet (0.4 mg total) under the tongue every 5 (five) minutes as needed for chest pain (CP or SOB). 07/27/14   Herminio Commons, MD  potassium chloride 20 MEQ/15ML (10%) SOLN Place 15 mLs (20 mEq total) into feeding tube daily for 5 days. 04/05/19 04/22/19  Kayleen Memos, DO  traMADol (ULTRAM) 50 MG tablet Take 50-100 mg by mouth 4 (four) times daily as needed (pain.).  01/29/19   [provider]     Family History  Problem Relation Age of Onset  . Hypertension Mother   . Heart disease Father   . Hypertension Father   . Heart disease Sister   . Hypertension Maternal Grandmother    . Hypertension Maternal Grandfather   . Hypertension Paternal Grandmother   . Hypertension Paternal Grandfather     Social History   Socioeconomic History  . Marital status: Married    Spouse name: [name]  . Number of children: 4  . Years of education: Not on file  . Highest education level: Not on file  Occupational History  . Occupation: Disabled    Comment: previously worked in Nurse, learning disability as an Investment banker, corporate  . Smoking status: Former Smoker    Packs/day: 3.00    Years: 32.00    Pack years: 96.00    Types: Cigarettes    Start date: 08/29/1977    Quit date: 11/29/2006    Years since quitting: 12.7  . Smokeless tobacco: Former Systems developer    Types: Chew  . Tobacco comment: 11/05/2012 "chewed tobacco when I play ball; aien't chewed since age 38"  Vaping Use  . Vaping Use: Never used  Substance and Sexual Activity  . Alcohol use: Yes    Alcohol/week: 12.0 standard drinks    Types: 12 Cans of beer per week    Comment: None since 02/2019   . Drug use: Yes    Types: Marijuana    Comment: denies on 04/08/19  . Sexual activity: Not Currently  Other Topics Concern  . Not on file  Social History Narrative   Patient is disabled.  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  Patient previously 3 pack/day use.   Patient currently drinking 3-4 beers per day from 12 pack a day.   Patient denies use of chew or other illicit drugs.   Social Determinants of Health   Financial Resource Strain: Low Risk   . Difficulty of Paying Living Expenses: Not hard at all  Food Insecurity: No Food Insecurity  . Worried About Charity fundraiser in the Last Year: Never true  . Ran Out of Food in the Last Year: Never true  Transportation Needs: No Transportation Needs  . Lack of Transportation (Medical): No  . Lack of Transportation (Non-Medical): No  Physical Activity: Insufficiently  Active  . Days of Exercise per Week: 2 days  . Minutes of Exercise per Session: 30 min  Stress: Stress Concern Present  . Feeling of Stress : To some extent  Social Connections: Socially Isolated  . Frequency of Communication with Friends and Family:  Once a week  . Frequency of Social Gatherings with Friends and Family: Once a week  . Attends Religious Services: Never  . Active Member of Clubs or Organizations: No  . Attends Archivist Meetings: Never  . Marital Status: Married     Review of Systems: A 12 point ROS discussed and pertinent positives are indicated in the HPI above.  All other systems are negative.  Review of Systems  Constitutional: Positive for appetite change.  Respiratory: Negative for shortness of breath.   Gastrointestinal: Negative for abdominal pain.  Psychiatric/Behavioral: Negative for behavioral problems and confusion.    Vital Signs: BP 124/89 (BP Location: Left Arm)   Pulse 75   Temp 98.1 F (36.7 C) (Oral)   Resp 18   Ht 6' (1.829 m)   Wt 152 lb (68.9 kg)   SpO2 100%   BMI 20.61 kg/m   Physical Exam Vitals reviewed.  HENT:     Mouth/Throat:     Mouth: Mucous membranes are moist.  Cardiovascular:     Rate and Rhythm: Normal rate and regular rhythm.     Heart sounds: Normal heart sounds.  Pulmonary:     Effort: Pulmonary effort is normal.     Breath sounds: Normal breath sounds.  Abdominal:     Palpations: Abdomen is soft.     Comments: Healing tract of previous G tube  Skin:    General: Skin is warm.  Neurological:     Mental Status: He is alert and oriented to person, place, and time.  Psychiatric:        Behavior: Behavior normal.     Imaging: CT ABDOMEN WO CONTRAST  Result Date: 08/11/2019 CLINICAL DATA:  Acute nonlocalized abdominal pain. Head fell out yesterday. EXAM: CT ABDOMEN WITHOUT CONTRAST TECHNIQUE: Multidetector CT imaging of the abdomen was performed following the standard protocol without IV contrast.  COMPARISON:  Abdominal CT 02/19/2019 FINDINGS: Lower chest: Motion artifact limits detailed assessment. Probable dependent atelectasis at the left lung base. Heart is normal in size. Small pericardial effusion anteriorly measures up to 12 mm in depth. Hepatobiliary: Tiny subcentimeter hypodensity in the right lobe of the liver, series 3, image 23, not definitively seen on prior exam. Gallbladder is unremarkable. Pancreas: No ductal dilatation or inflammation. Spleen: Normal in size without focal abnormality. Stable splenule anteriorly at the hilum. Adrenals/Urinary Tract: Normal adrenal glands. Symmetric bilateral perinephric edema that is similar to prior exam. There is no hydronephrosis. No visualized renal calculi. Vascular calcifications at the hilum. No evidence of focal renal lesion on noncontrast exam. Stomach/Bowel: Distal esophagus is unremarkable. Small fluid level in the stomach. There is no gastric wall thickening. Minimal soft tissue density adjacent to the anterior greater curvature likely site of prior gastrostomy tube. Small foci of air in the overlying subcutaneous soft tissues but no free intra-abdominal air. The stomach abuts the anterior abdominal wall. Included small bowel is unremarkable. There is contrast throughout the colon. No acute bowel inflammation. Vascular/Lymphatic: Aortic atherosclerosis. Branch atherosclerosis. No aortic aneurysm. No abdominal adenopathy. Other: Minimal soft tissue tract in the anterior abdominal wall just to the left of midline consistent with prior gastrostomy tube, series 3, image 29. few foci of adjacent air in the subcutaneous tissues and anterior abdominal wall musculature. No focal fluid collection. No pneumoperitoneum. No ascites or free fluid in the upper abdomen. Musculoskeletal: Degenerative change in the included spine. No focal bone lesion or acute osseous abnormality. IMPRESSION: 1. Minimal soft tissue density tract adjacent to the  anterior greater  curvature of the stomach likely site of prior gastrostomy tube. Small foci of air in the overlying subcutaneous tissues and anterior abdominal wall musculature but no free intra-abdominal air or focal fluid collection. 2. Small pericardial effusion measuring up to 12 mm in depth. 3. Tiny subcentimeter hypodensity in the right lobe of the liver, not definitively seen on prior exam. This is nonspecific and incompletely characterized in the absence of contrast, particularly given small size. Aortic Atherosclerosis (ICD10-I70.0). Electronically Signed   By: Keith Rake M.D.   On: 08/11/2019 16:29   DG OP Swallowing Func-Medicare/Speech Path  Result Date: 07/28/2019 [place], [place], [address] Phone: [phone]   Fax:  [phone] Modified Barium Swallow Patient Details Name: DEONTAE ROBSON MRN: [id number] Date of Birth: December 18, 1960 No data recorded Encounter Date: 07/24/2019  End of Session - 07/24/19 1706   Visit Number 3   Number of Visits 6   Date for SLP Re-Evaluation 08/28/19   Authorization Type [employer]   no deductible, OOP $900, copay $10, 30 visit limit, auth required  SLP Start Time 1140   SLP Stop Time  1207   SLP Time Calculation (min) 27 min   Activity Tolerance Patient tolerated treatment well     Past Medical History: Diagnosis Date . Anxiety  . Arthritis   back . Bradycardia   a. During 11/2012 adm: lopressor decreased. . Cancer of larynx ([place])  . Carotid artery stenosis  . 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 Coronary artery disease   a. Diag cath 10/2012 for CP/abnormal nuc -> planned PCI s/p LAD atherectomy/DES placement 11/05/12. Marland Kitchen GERD (gastroesophageal reflux disease)  . History of hiatal hernia  . Hypercholesteremia  . Hypertension  . Stroke ([place]) 07/31/2018  bilateral vertebrobasilar occlusion; "left side weaker thanit was before." Past  Surgical History: Procedure Laterality Date . CARDIAC CATHETERIZATION  10/29/2012 . CORONARY ANGIOPLASTY WITH STENT PLACEMENT  11/05/2012 . DIRECT LARYNGOSCOPY N/A 01/02/2019  Procedure: MICRO DIRECT LARYNGOSCOPY BIOPSY OF LARYNGEAL MASS;  Surgeon: [name], MD;  Location: [place] OR;  Service: ENT;  Laterality: N/A; . HEMORRHOID SURGERY  ~ 2010 . INSERTION OF MESH N/A 06/02/2015  Procedure: INSERTION OF MESH;  Surgeon: [name], MD;  [facility];  Service: General;  Laterality: N/A; . IR ANGIO INTRA EXTRACRAN SEL COM CAROTID INNOMINATE BILAT MOD SED  08/02/2018 . IR ANGIO VERTEBRAL SEL VERTEBRAL BILAT MOD SED  08/02/2018 . LAPAROSCOPIC INSERTION GASTROSTOMY TUBE N/A 02/24/2019  Procedure: LAPAROSCOPIC INSERTION GASTROSTOMY TUBE;  Surgeon: [name], MD;  Location: [place];  Service: General;  Laterality: N/A; . LEFT HEART CATHETERIZATION WITH CORONARY ANGIOGRAM N/A 10/30/2012  Procedure: LEFT HEART CATHETERIZATION WITH CORONARY ANGIOGRAM;  Surgeon: [name], MD;  Location: [place] CATH LAB;  Service: Cardiovascular;  Laterality: N/A; . MULTIPLE EXTRACTIONS WITH ALVEOLOPLASTY N/A 02/18/2019  Procedure: Extraction of tooth #'s 5-7, 10,11,14,20-29, 31 and 32 with alveoloplasty and maxiillary left buccal exostoses reductions;  Surgeon: [name], [name];  Location: [place];  Service: Oral Surgery;  Laterality: N/A; . PERCUTANEOUS CORONARY ROTOBLATOR INTERVENTION (PCI-R) N/A 11/05/2012  Procedure: PERCUTANEOUS CORONARY ROTOBLATOR INTERVENTION (PCI-R);  Surgeon: [name], MD;  Location: [facility] CATH LAB;  Service: Cardiovascular;  Laterality: N/A; . TRACHEOSTOMY TUBE PLACEMENT N/A 02/18/2019  Procedure: Tracheostomy;  Surgeon: [name], MD;  Location: [place];  Service: ENT;  Laterality: N/A; . UMBILICAL HERNIA REPAIR N/A 06/02/2015  Procedure: UMBILICAL HERNIORRHAPHY WITH MESH;  Surgeon: Franky Macho, MD;  [facility];  Service: General;  Laterality: N/A; There were no vitals filed for this visit.  Subjective  Assessment - 07/24/19 1657   Subjective "Just water"   Special Tests MBSS   Currently in Pain? No/denies      General - 07/24/19 1657    General Information  Date of Onset 01/02/19   HPI  Mr. Jahson Emanuele is a 59 yo male with  supraglottic carcinoma. Laryngoscopy and biopsy on 01/02/2019, trach placed on 02/18/2019. PET scan on 01/27/2019 showed hypermetabolic laryngeal lesion with hypermetabolic cervical metastatic disease bilaterally. Weekly cisplatin and radiation started on 03/07/2019, many interruptions due to hospitalizations. Patient completed chemotherapy ( last weekly cisplatin on 05/15/2019) and radiation (03/07/2019 through 05/08/2019). Pt reportedly had a follow appointment with ENT, Dr. Suszanne Conners July 1, but no records found. He also has a history of a bilateral pontine stroke in 07/2018. He last saw Dr. Kristin Bruins in June 2021 and a bony spicule in the area of #20 on the buccal aspect that is tender to palpation was identified and will ideally need aveoloplasty. He has a PEG and only drinks water by mouth at this time. He was referred by Dr. Lonie Peak for MBSS.  Type of Study MBS-Modified Barium Swallow Study   Diet Prior to this Study NPO;PEG tube   Temperature Spikes Noted No   Respiratory Status Room air   History of Recent Intubation No   Behavior/Cognition Alert;Cooperative;Pleasant mood   Oral Cavity Assessment Within Functional Limits   Oral Cavity - Dentition Missing dentition;Poor condition   he reportedly saw the oral surgeon in [place], [place] yesterday with plans for extractions  Vision Functional for self feeding   Self-Feeding Abilities Able to feed self   Patient Positioning Upright in chair   Baseline Vocal Quality Normal   Volitional Cough Strong   Volitional Swallow Able to elicit   Anatomy Within functional limits   Pharyngeal Secretions Not observed secondary MBS      Oral Preparation/Oral Phase - 07/24/19 1658    Oral Preparation/Oral Phase  Oral Phase Impaired    Oral - Nectar  Oral - Nectar  Teaspoon Decreased bolus cohesion    Oral - Thin  Oral - Thin Teaspoon Decreased bolus cohesion   Oral - Thin Cup Decreased bolus cohesion    Oral - Solids  Oral - Puree Decreased bolus cohesion    Electrical stimulation - Oral Phase  Was Electrical Stimulation Used No      Pharyngeal Phase - 07/24/19 1701    Pharyngeal Phase  Pharyngeal Phase Impaired    Pharyngeal - Nectar  Pharyngeal- Nectar Teaspoon Swallow initiation at vallecula;Reduced pharyngeal peristalsis;Reduced airway/laryngeal closure;Penetration/Apiration after swallow;Moderate aspiration;Pharyngeal residue - cp segment   Pharyngeal Material enters airway, passes BELOW cords without attempt by patient to eject out (silent aspiration);Material enters airway, passes BELOW cords and not ejected out despite cough attempt by patient    Pharyngeal - Thin  Pharyngeal- Thin Teaspoon Swallow initiation at vallecula;Pharyngeal residue - cp segment   Pharyngeal- Thin Cup Swallow initiation at vallecula;Swallow initiation at pyriform sinus;Reduced pharyngeal peristalsis;Penetration/Aspiration during swallow;Penetration/Apiration after swallow;Trace aspiration;Moderate aspiration;Pharyngeal residue - cp segment   Pharyngeal Material enters airway, passes BELOW cords and not ejected out despite cough attempt by patient;Material enters airway, passes BELOW cords without attempt by patient to eject out (silent aspiration)    Pharyngeal - Solids  Pharyngeal- Puree  Swallow initiation at vallecula;Reduced pharyngeal peristalsis;Reduced epiglottic inversion;Reduced tongue base retraction;Pharyngeal residue - valleculae;Pharyngeal residue - pyriform;Penetration/Apiration after swallow   Pharyngeal Material enters airway, passes BELOW cords and not ejected out despite cough attempt by patient;Material enters airway, passes BELOW cords without attempt by patient to eject out (silent aspiration)    Electrical Stimulation - Pharyngeal Phase  Was Electrical Stimulation Used No       Cricopharyngeal Phase - 07/24/19 1704    Cervical Esophageal Phase  Cervical Esophageal Phase --   narrowing near C4-5     SLP Short Term Goals - 07/24/19 1707    SLP SHORT TERM GOAL #1  Title Pt will demonstrate safe and efficient consumption of self regulated regular textures with thin liquids with use of strategies as needed.   Baseline Currently consuming a variety of regular textures and all liquids   Time 3   Period Months   Status On-going   Target Date 08/28/19    SLP SHORT TERM GOAL #2  Title Pt will complete pharyngeal swallowing exercises as assigned with use of written cue after initial introduction/model from SLP   Baseline Introduced this date   Time 3   Period Months   Status On-going   Target Date 08/28/19    SLP SHORT TERM GOAL #3  Title Pt will verbalize 3 signs/symptoms of aspiration pneumonia with min assist.   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 Pt demonstrated seemingly normal pharyngeal swallow response with initial tsp presentations of thin barium (vallecular trigger) on two trials, however ensuing trials via cup sip thin, tsp NTL, puree, and back to tsp thin yielded penetration during and aspiration after the swallow variably sensed (cough) and unsensed. Pt with blunted/bulbous epiglottis from post radiation effects, reduced epiglottic deflection with heavier bolus (puree), reduced pharyngeal constriction, reduced laryngeal closure, narrowing near PE segment resulting in stasis of bolus near PES and pyfiroms and aspiration after the swallow from residuals spilling into larynx across  consistencies. Question whether the narrowing in pharynx near C4-5 is also due to radiation changes (may benefit from dilation?). Recommend primary source of nutrition via PEG due to high risk for aspiration and continued dysphagia therapy with a focus on Pt taking single tsp presentations of thin water after oral care. Pt should try to wear his PMSV during all waking hours. SLP will try to get records from his most recent ENT appointment.   Speech Therapy Frequency 1x /week   1-2x per month after chemoradiation  Duration 4 weeks   3 months  Treatment/Interventions Aspiration precaution training;SLP instruction and feedback;Pharyngeal strengthening exercises;Compensatory strategies;Patient/family education;Compensatory techniques;Diet toleration management by SLP   MBSS if indicated  Potential to Achieve Goals Good   SLP Home Exercise Plan Pt will complete HEP as assigned to facilitate carryover of treatment strategies and techniques in home environment with written cues   Consulted and Agree with Plan of Care Patient     Patient will benefit from skilled therapeutic intervention in order to improve the following deficits  and impairments:  Dysphagia, oropharyngeal phase  Recommendations/Treatment - 07/24/19 1704    Swallow Evaluation Recommendations  Recommended Consults Consider ENT evaluation   SLP Diet Recommendations NPO   Mare Ferrari free water protocol  Liquid Administration via Spoon   Medication Administration Via alternative means   Supervision Patient able to self feed   Compensations Small sips/bites;Multiple dry swallows after each bite/sip;Hard cough after swallow   Postural Changes Seated upright at 90 degrees;Remain upright for at least 30 minutes after feeds/meals      Prognosis - 07/24/19 1705    Prognosis  Prognosis for Safe Diet Advancement Guarded   Barriers to Reach Goals Severity of deficits    Individuals Consulted  Consulted and Agree with Results and Recommendations Patient   Report Sent to   Referring physician     Problem List Patient Active Problem List  Diagnosis Date Noted . HCAP (healthcare-associated pneumonia) 04/10/2019 . Febrile illness, acute 04/08/2019 . Malnutrition of moderate degree 04/03/2019 . GI bleed 04/01/2019 . Dehydration 03/28/2019 . RLL pneumonia 03/17/2019 . Hx of tracheostomy 02/18/2019 . Head and neck cancer ([place]) 02/18/2019 . 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 Stroke ([place]) 07/31/2018 . Rotator cuff syndrome of right shoulder 02/20/2013 . Bradycardia 11/06/2012 . Coronary artery disease  . Abnormal finding on cardiovascular stress test 09/25/2012 . Hypertension 09/03/2012 . Chest pain 09/03/2012 . Alcohol abuse, daily use 09/03/2012 . Hyperlipidemia 09/03/2012 . Hyponatremia 09/03/2012 Thank you, Genene Churn, [place] [facility] 07/24/2019, 5:08 PM [facility] [address] [place], [place], [address] Phone: [phone]   Fax:  [phone] Name: ALOK MINSHALL MRN: [id number] Date of Birth: 10/25/1960 CLINICAL DATA:  Dysphagia. Laryngeal cancer post radiation therapy, tracheostomy, PEG tube placement, significant neck swelling EXAM: MODIFIED BARIUM SWALLOW TECHNIQUE: Different consistencies of barium were administered orally to the patient by the Speech Pathologist. Imaging of the pharynx was performed in the lateral projection. The radiologist was present in the fluoroscopy room for this study, providing personal supervision. FLUOROSCOPY TIME:  Fluoroscopy Time:  4 minutes 12 seconds Radiation Exposure Index (if provided by the fluoroscopic device): 22.6 mGy Number of Acquired Spot Images: Multiple fluoroscopic series were saved COMPARISON:  02/21/2019 FINDINGS: With thin barium by teaspoon and cup, week hypopharyngeal muscles are seen with laryngeal penetration and aspiration. Aspiration particularly is identified from piriform sinus residuals. Laryngeal penetration also occurred with nectar consistency  with piriform sinus residuals. Delayed initiation of swallow was identified with applesauce consistency with vallecular and piriform sinus residuals. Laryngeal penetration aspiration of the applesauce consistency occurred from the residuals. A swallow of thin barium to help past the applesauce also demonstrated laryngeal penetration aspiration. Patient was able to clear a portion of the aspirated contrast by coughing. IMPRESSION: Significant swallowing dysfunction as above. Please refer to the Speech Pathologists report for complete details and recommendations. Electronically Signed   By: Lavonia Dana M.D.   On: 07/24/2019 12:22   DG ABDOMEN PEG TUBE LOCATION  Result Date: 08/08/2019 CLINICAL DATA:  Peg tube position. EXAM: ABDOMEN - 1 VIEW COMPARISON:  CT abdomen 02/19/2019 FINDINGS: Peg tube injected with 30 mL Omnipaque 300. Peg tube tip is in the gastric antrum. The balloon is inflated in the antrum. There is contrast in the stomach and duodenum. No extravasation. IMPRESSION: Peg tube in the gastric antrum. Electronically Signed   By: Franchot Gallo M.D.   On: 08/08/2019 17:56    Labs:  CBC: Recent Labs    05/08/19 0921 05/15/19 0900 05/29/19  5072 08/10/19 1538  WBC 11.1* 6.3 2.2* 7.5  HGB 10.0* 9.6* 8.8* 12.1*  HCT 30.8* 28.6* 26.7* 37.4*  PLT 225 158 120* 314    COAGS: Recent Labs    02/21/19 0447 03/04/19 0503 03/17/19 1655 04/08/19 1309  INR 1.0 1.0 1.0 1.1  APTT  --   --  26 30    BMP: Recent Labs    05/08/19 0921 05/15/19 0900 05/29/19 0918 08/10/19 1538  NA 131* 131* 134* 138  K 3.8 3.4* 3.6 3.8  CL 95* 95* 96* 101  CO2 '27 24 25 25  '$ GLUCOSE 100* 123* 111* 106*  BUN '19 19 17 14  '$ CALCIUM 9.1 9.1 9.4 9.9  CREATININE 0.76 0.82 0.84 0.94  GFRNONAA >60 >60 >60 >60  GFRAA >60 >60 >60 >60    LIVER FUNCTION TESTS: Recent Labs    05/01/19 1006 05/08/19 0921 05/15/19 0900 05/29/19 0918  BILITOT 0.6 0.6 0.6 0.6  AST '26 19 20 17  '$ ALT 33 '20 18 15  '$ ALKPHOS 104  94 82 91  PROT 7.9 7.7 7.1 6.8  ALBUMIN 3.5 3.7 3.4* 3.6    TUMOR MARKERS: No results for input(s): AFPTM, CEA, CA199, CHROMGRNA in the last 8760 hours.  Assessment and Plan:  Throat Cancer G tube placed 02/24/19 in OR [facility] Remained in use til just 8/8 Bedside and EGD attempts failed Now scheduled for IR replacement vs new placement Risks and benefits image guided gastrostomy tube placement was discussed with the patient including, but not limited to the need for a barium enema during the procedure, bleeding, infection, peritonitis and/or damage to adjacent structures.  All of the patient's questions were answered, patient is agreeable to proceed.  Consent signed and in chart.    Thank you for this interesting consult.  I greatly enjoyed meeting DUANTE AROCHO and look forward to participating in their care.  A copy of this report was sent to the requesting provider on this date.  Electronically Signed: Lavonia Drafts, PA-C 08/12/2019, 1:27 PM   I spent a total of 20 Minutes    in face to face in clinical consultation, greater than 50% of which was counseling/coordinating care for percutaneous gastric tube replacement vs new placement

## 2019-08-12 NOTE — Plan of Care (Signed)
°  Problem: Coping: °Goal: Level of anxiety will decrease °Outcome: Progressing °  °

## 2019-08-12 NOTE — Progress Notes (Signed)
New Admission Note:   Arrival Method:  PTAR Mental Orientation: alert X4 Telemetry: none Assessment: Completed Skin: intact IV: NSL Pain: reposition and received from [place] Safety Measures: [place] has been discussed Admission: Completed [facility] Orientation: Patient has been orientated to the room, unit and staff.  Family: none at bedside  Orders have been reviewed and implemented. Will continue to monitor the patient. Call light has been placed within reach and bed alarm has been activated.   Rockie Neighbours BSN, RN Phone number: [phone]

## 2019-08-12 NOTE — Progress Notes (Addendum)
Initial Nutrition Assessment  DOCUMENTATION CODES:   Not applicable  INTERVENTION:  Once PEG is ready for use, recommend the following: -Provide 377ml (1.5 cartons) Osmolite 1.5 cal QID -58ml Prosource TF BID  Tube feeding regimen provides 2290 kcals (meets 99% estimated needs), 133 grams of protein, 1034ml free water  NUTRITION DIAGNOSIS:   Inadequate oral intake related to cancer and cancer related treatments, dysphagia as evidenced by other (comment) (s/p PEG placement).    GOAL:   Patient will meet greater than or equal to 90% of their needs    MONITOR:   TF tolerance, Labs, I & O's, Weight trends  REASON FOR ASSESSMENT:   Consult Enteral/tube feeding initiation and management  ASSESSMENT:   Pt presented with PEG tube malfunction. PMH includes malignant neoplasm of supraglottis s/p tracheostomy, s/p prior chemotherapy and radiation, dysphagia s/p PEG tube placement (02/24/19), h/o CAD, s/p PCI with stenting (2014), h/o CVA, HTN, HLD.   8/9 aborted EGD with PEG due to esophageal strictures in the setting of prior neck radiation  Pt noted to have been able to use his PEG until 08/10/19. Per outpatient RD documentation from 07/30/19, pt's home TF regimen is supposed to be as follows: 6-7 cartons of Osmolite 1.5 cal daily with 79ml Prostat daily. Per today's discussion with pt's wife, pt most often takes 5 cartons per day with 56ml Prostat. Pt is flushing before and after each bolus. Pt takes some water/soda po. Pt occasionally takes additional recommended carton and sometimes boluses more than 1 carton at a time. RD will order similar TF regimen to begin if necessary; however, pt should continue with TF regimen ordered by outpatient RD upon discharge.  Per wt readings, pt weighed 74.1 kg on 04/22/19 and now weighs 68.9kg. This indicates a 6.75% wt loss x4 months, which is not significant for time frame.   Labs and medications reviewed.  NUTRITION - FOCUSED PHYSICAL  EXAM: Unable to perform at this time, will attempt at follow-up.    Diet Order:   Diet Order            Diet clear liquid Room service appropriate? Yes; Fluid consistency: Thin  Diet effective now                 EDUCATION NEEDS:   No education needs have been identified at this time  Skin:  Skin Assessment: Skin Integrity Issues: Skin Integrity Issues:: Incisions Incisions: abdomen  Last BM:  PTA  Height:   Ht Readings from Last 1 Encounters:  08/10/19 6' (1.829 m)    Weight:   Wt Readings from Last 1 Encounters:  08/10/19 68.9 kg    BMI:  Body mass index is 20.61 kg/m.  Estimated Nutritional Needs:   Kcal:  2300-2500  Protein:  115-130 grams  Fluid:  >2L/d    Larkin Ina, MS, RD, LDN RD pager number and weekend/on-call pager number located in [place].

## 2019-08-12 NOTE — Progress Notes (Signed)
Pt states ATC is making him feel dry. Pt resting comfortably on 21% with PMV on. RT will continue to monitor.

## 2019-08-12 NOTE — Progress Notes (Signed)
Patient Demographics:    [name], is a 59 y.o. male, DOB - December 20, 1960, LTJ:[id number]  Admit date - 08/10/2019   Admitting Physician Courage Denton Brick, MD  Outpatient Primary MD for the patient is [name], MD  LOS - 1   Chief Complaint  Patient presents with  . Other    feeding tube problem        Subjective:    [name] feels okay, waiting for PEG tube replacement today  Assessment  & Plan :    Brief Summary 59 year old with past medical history relevant for malignant neoplasm of supraglottis, status post tracheostomy, status post prior chemotherapy and radiation; dysphagia status post PEG tube placement, H/o CAD, s/p  PCI with stenting in 2014, history of nonhemorrhagic CVA, chronic lower back pain, essential hypertension, HLD admitted on 08/10/2019 with PEG malfunction/PEG fell out , originally placed by Dr. Redmond Pulling on 02/24/2019 -Subsequently was seen in the ED on 8/6 after the plastic tip of the cap of his G-tube became dislodged and fell into the G-tube, this was replaced in the emergency room and a postprocedure x-ray showed good placement of the PEG tube and discharged home -Seen back in the ED on 8/8, tube fell out overnight and patient noticed this upon waking up the following day -General surgeon Dr. Arnoldo Morale attempted to replace PEG tube, unable to perform endoscopy and place PEG tube due to esophageal strictures/neck radiation malignancy etc. -Subsequently transferred to [facility] 8/10 for IR consultation for PEG tube replacement  Assessment and plan  PEG tube malfunction -See extensive discussion above, part of the tubing was replaced on ED visit 8/6 -Back in the ED after PEG tube fell out 8/8 -Unsuccessful attempt to replace PEG tube via endoscopy yesterday per Dr. Arnoldo Morale subsequently transferred to [facility] -IR consulting, plan for PEG tube this afternoon -Remains n.p.o., resume  tube feeds after cleared by IR, continue IV fluids in the interim PEG tube malfunction  Stage IV laryngeal cancer -Status post XRT, chemotherapy -Tracheostomy with trach collar, stable -Follow-up with Dr. Delton Coombes at [facility]  History of CVA -Currently n.p.o. pending PEG tube replacement, resume Lipitor and aspirin after this  History of CAD -Stable, resume aspirin Lipitor later today or in a.m.  DVT prophylaxis: SCDs, pending PEG tube today CODE STATUS: Full code Family communication: Discussed with patient in detail, no family at bedside Disposition:   The patient is from: Home              Anticipated d/c is to: Home              Anticipated d/c date is: 1 day              Patient currently is not medically stable to d/c.  Consults  :  Gen Surg/IR   Lab Results  Component Value Date   PLT 314 08/10/2019    Inpatient Medications  Scheduled Meds: . fentaNYL      . lidocaine      . midazolam       Continuous Infusions: . ceFAZolin    .  ceFAZolin (ANCEF) IV 2 g (08/12/19 1351)  . dextrose 5 % and 0.9% NaCl 75 mL/hr at 08/12/19 1040   PRN Meds:.morphine  injection, ondansetron **OR** ondansetron (ZOFRAN) IV    Anti-infectives (From admission, onward)   Start     Dose/Rate Route Frequency Ordered Stop   08/12/19 1350  ceFAZolin (ANCEF) 2-4 GM/100ML-% IVPB       Note to Pharmacy: [name]   : cabinet override      08/12/19 1350 08/13/19 0159   08/12/19 1300  ceFAZolin (ANCEF) IVPB 2g/100 mL premix        2 g 200 mL/hr over 30 Minutes Intravenous On call to O.R. 08/11/19 1205 08/11/19 1436   08/12/19 0600  ceFAZolin (ANCEF) IVPB 2g/100 mL premix     Discontinue     2 g 200 mL/hr over 30 Minutes Intravenous Every 8 hours 08/11/19 1624          Objective:   Vitals:   08/12/19 1400 08/12/19 1405 08/12/19 1410 08/12/19 1415  BP: (!) 161/99 (!) 154/93 (!) 177/103 (!) 165/97  Pulse: 71 73 74 71  Resp: 14 15 16 14   Temp:      TempSrc:       SpO2: 96% 100% 97% 96%  Weight:      Height:        Wt Readings from Last 3 Encounters:  08/10/19 68.9 kg  08/08/19 68.5 kg  06/24/19 71.7 kg    Intake/Output Summary (Last 24 hours) at 08/12/2019 1524 Last data filed at 08/12/2019 1243 Gross per 24 hour  Intake 1906.29 ml  Output 650 ml  Net 1256.29 ml   Physical Exam  Gen:-Pleasant male sitting up in bed, awake alert oriented x3, no distress HEENT: Tracheostomy with trach collar on room air, discoloration of the neck CVS: S1-S2, regular rate rhythm Lungs: Clear bilaterally Abdomen: Soft, nontender, bowel sounds present, PEG tube orifice noted Extremities: No edema Psych: Appropriate mood and affect  Data Review:   Micro Results Recent Results (from the past 240 hour(s))  SARS Coronavirus 2 by RT PCR (hospital order, performed in [facility] hospital lab) Nasopharyngeal Nasopharyngeal Swab     Status: None   Collection Time: 08/10/19  3:04 PM   Specimen: Nasopharyngeal Swab  Result Value Ref Range Status   SARS Coronavirus 2 NEGATIVE NEGATIVE Final    Comment: (NOTE) SARS-CoV-2 target nucleic acids are NOT DETECTED.  The SARS-CoV-2 RNA is generally detectable in upper and lower respiratory specimens during the acute phase of infection. The lowest concentration of SARS-CoV-2 viral copies this assay can detect is 250 copies / mL. A negative result does not preclude SARS-CoV-2 infection and should not be used as the sole basis for treatment or other patient management decisions.  A negative result may occur with improper specimen collection / handling, submission of specimen other than nasopharyngeal swab, presence of viral mutation(s) within the areas targeted by this assay, and inadequate number of viral copies (<250 copies / mL). A negative result must be combined with clinical observations, patient history, and epidemiological information.  Fact Sheet for Patients:    StrictlyIdeas.no  Fact Sheet for Healthcare Providers: BankingDealers.co.za  This test is not yet approved or  cleared by the [place] FDA and has been authorized for detection and/or diagnosis of SARS-CoV-2 by FDA under an Emergency Use Authorization (EUA).  This EUA will remain in effect (meaning this test can be used) for the duration of the COVID-19 declaration under Section 564(b)(1) of the Act, 21 U.S.C. section 360bbb-3(b)(1), unless the authorization is terminated or revoked sooner.  Performed at [facility], [address], [place], [place]  [place]     Radiology Reports CT ABDOMEN WO CONTRAST  Result Date: 08/11/2019 CLINICAL DATA:  Acute nonlocalized abdominal pain. Head fell out yesterday. EXAM: CT ABDOMEN WITHOUT CONTRAST TECHNIQUE: Multidetector CT imaging of the abdomen was performed following the standard protocol without IV contrast. COMPARISON:  Abdominal CT 02/19/2019 FINDINGS: Lower chest: Motion artifact limits detailed assessment. Probable dependent atelectasis at the left lung base. Heart is normal in size. Small pericardial effusion anteriorly measures up to 12 mm in depth. Hepatobiliary: Tiny subcentimeter hypodensity in the right lobe of the liver, series 3, image 23, not definitively seen on prior exam. Gallbladder is unremarkable. Pancreas: No ductal dilatation or inflammation. Spleen: Normal in size without focal abnormality. Stable splenule anteriorly at the hilum. Adrenals/Urinary Tract: Normal adrenal glands. Symmetric bilateral perinephric edema that is similar to prior exam. There is no hydronephrosis. No visualized renal calculi. Vascular calcifications at the hilum. No evidence of focal renal lesion on noncontrast exam. Stomach/Bowel: Distal esophagus is unremarkable. Small fluid level in the stomach. There is no gastric wall thickening. Minimal soft tissue density adjacent to the anterior greater curvature  likely site of prior gastrostomy tube. Small foci of air in the overlying subcutaneous soft tissues but no free intra-abdominal air. The stomach abuts the anterior abdominal wall. Included small bowel is unremarkable. There is contrast throughout the colon. No acute bowel inflammation. Vascular/Lymphatic: Aortic atherosclerosis. Branch atherosclerosis. No aortic aneurysm. No abdominal adenopathy. Other: Minimal soft tissue tract in the anterior abdominal wall just to the left of midline consistent with prior gastrostomy tube, series 3, image 29. few foci of adjacent air in the subcutaneous tissues and anterior abdominal wall musculature. No focal fluid collection. No pneumoperitoneum. No ascites or free fluid in the upper abdomen. Musculoskeletal: Degenerative change in the included spine. No focal bone lesion or acute osseous abnormality. IMPRESSION: 1. Minimal soft tissue density tract adjacent to the anterior greater curvature of the stomach likely site of prior gastrostomy tube. Small foci of air in the overlying subcutaneous tissues and anterior abdominal wall musculature but no free intra-abdominal air or focal fluid collection. 2. Small pericardial effusion measuring up to 12 mm in depth. 3. Tiny subcentimeter hypodensity in the right lobe of the liver, not definitively seen on prior exam. This is nonspecific and incompletely characterized in the absence of contrast, particularly given small size. Aortic Atherosclerosis (ICD10-I70.0). Electronically Signed   By: Keith Rake M.D.   On: 08/11/2019 16:29   DG OP Swallowing Func-Medicare/Speech Path  Result Date: 07/28/2019 [place], [place], [address] Phone: [phone]   Fax:  [phone] Modified Barium Swallow Patient Details Name: [name] MRN: [id number] Date of Birth: 05-29-1960 No data recorded Encounter Date: 07/24/2019  End of Session - 07/24/19 1706   Visit Number 3   Number  of Visits 6   Date for SLP Re-Evaluation 08/28/19   Authorization Type [employer]   no deductible, OOP $900, copay $10, 30 visit limit, auth required  SLP Start Time 1140   SLP Stop Time  1207   SLP Time Calculation (min) 27 min   Activity Tolerance Patient tolerated treatment well     Past Medical History: Diagnosis Date . Anxiety  . Arthritis   back . Bradycardia   a. During 11/2012 adm: lopressor decreased. . Cancer of larynx ([place])  . Carotid artery stenosis  . Chronic lower back pain   "I work [employer]; messed back up ~ 30 yr  ago; paralyzed for 2 days then" (11/05/2012) . Coronary artery disease   a. Diag cath 10/2012 for CP/abnormal nuc -> planned PCI s/p LAD atherectomy/DES placement 11/05/12. Marland Kitchen GERD (gastroesophageal reflux disease)  . History of hiatal hernia  . Hypercholesteremia  . Hypertension  . Stroke ([place]) 07/31/2018  bilateral vertebrobasilar occlusion; "left side weaker thanit was before." Past Surgical History: Procedure Laterality Date . CARDIAC CATHETERIZATION  10/29/2012 . CORONARY ANGIOPLASTY WITH STENT PLACEMENT  11/05/2012 . DIRECT LARYNGOSCOPY N/A 01/02/2019  Procedure: MICRO DIRECT LARYNGOSCOPY BIOPSY OF LARYNGEAL MASS;  Surgeon: [name], MD;  Location: [facility];  Service: ENT;  Laterality: N/A; . HEMORRHOID SURGERY  ~ 2010 . INSERTION OF MESH N/A 06/02/2015  Procedure: INSERTION OF MESH;  Surgeon: [name], MD;  Location: [facility];  Service: General;  Laterality: N/A; . IR ANGIO INTRA EXTRACRAN SEL COM CAROTID INNOMINATE BILAT MOD SED  08/02/2018 . IR ANGIO VERTEBRAL SEL VERTEBRAL BILAT MOD SED  08/02/2018 . LAPAROSCOPIC INSERTION GASTROSTOMY TUBE N/A 02/24/2019  Procedure: LAPAROSCOPIC INSERTION GASTROSTOMY TUBE;  Surgeon: [name], MD;  Location: [facility];  Service: General;  Laterality: N/A; . LEFT HEART CATHETERIZATION WITH CORONARY ANGIOGRAM N/A 10/30/2012  Procedure: LEFT HEART CATHETERIZATION WITH CORONARY ANGIOGRAM;  Surgeon: [name], MD;  Location: [facility] C[facility];  Service:  Cardiovascular;  Laterality: N/A; . MULTIPLE EXTRACTIONS WITH ALVEOLOPLASTY N/A 02/18/2019  Procedure: Extraction of tooth #'s 5-7, 10,11,14,20-29, 31 and 32 with alveoloplasty and maxiillary left buccal exostoses reductions;  Surgeon: [name], [name];  Location: [facility];  Service: Oral Surgery;  Laterality: N/A; . PERCUTANEOUS CORONARY ROTOBLATOR INTERVENTION (PCI-R) N/A 11/05/2012  Procedure: PERCUTANEOUS CORONARY ROTOBLATOR INTERVENTION (PCI-R);  Surgeon: [name], MD;  Location: [facility];  Service: Cardiovascular;  Laterality: N/A; . TRACHEOSTOMY TUBE PLACEMENT N/A 02/18/2019  Procedure: Tracheostomy;  Surgeon: [name], MD;  Location: [facility];  Service: ENT;  Laterality: N/A; . UMBILICAL HERNIA REPAIR N/A 06/02/2015  Procedure: UMBILICAL HERNIORRHAPHY WITH MESH;  Surgeon: [name], MD;  Location: [facility];  Service: General;  Laterality: N/A; There were no vitals filed for this visit.  Subjective Assessment - 07/24/19 1657   Subjective "Just water"   Special Tests MBSS   Currently in Pain? No/denies      General - 07/24/19 1657    General Information  Date of Onset 01/02/19   HPI  [name] is a 59 yo male with  supraglottic carcinoma. Laryngoscopy and biopsy on 01/02/2019, trach placed on 02/18/2019. PET scan on 01/27/2019 showed hypermetabolic laryngeal lesion with hypermetabolic cervical metastatic disease bilaterally. Weekly cisplatin and radiation started on 03/07/2019, many interruptions due to hospitalizations. Patient completed chemotherapy ( last weekly cisplatin on 05/15/2019) and radiation (03/07/2019 through 05/08/2019). Pt reportedly had a follow appointment with ENT, Dr. Benjamine Mola July 1, but no records found. He also has a history of a bilateral pontine stroke in 07/2018. He last saw Dr. Enrique Sack in June 2021 and a bony spicule in the area of #20 on the buccal aspect that is tender to palpation was identified and will ideally need aveoloplasty. He has a PEG and only drinks water by mouth at  this time. He was referred by Dr. Eppie Gibson for MBSS.  Type of Study MBS-Modified Barium Swallow Study   Diet Prior to this Study NPO;PEG tube   Temperature Spikes Noted No   Respiratory Status Room air   History of Recent Intubation No   Behavior/Cognition Alert;Cooperative;Pleasant mood   Oral Cavity Assessment Within Functional  Limits   Oral Cavity - Dentition Missing dentition;Poor condition   he reportedly saw the oral surgeon in [place], [place] yesterday with plans for extractions  Vision Functional for self feeding   Self-Feeding Abilities Able to feed self   Patient Positioning Upright in chair   Baseline Vocal Quality Normal   Volitional Cough Strong   Volitional Swallow Able to elicit   Anatomy Within functional limits   Pharyngeal Secretions Not observed secondary MBS      Oral Preparation/Oral Phase - 07/24/19 1658    Oral Preparation/Oral Phase  Oral Phase Impaired    Oral - Nectar  Oral - Nectar Teaspoon Decreased bolus cohesion    Oral - Thin  Oral - Thin Teaspoon Decreased bolus cohesion   Oral - Thin Cup Decreased bolus cohesion    Oral - Solids  Oral - Puree Decreased bolus cohesion    Electrical stimulation - Oral Phase  Was Electrical Stimulation Used No      Pharyngeal Phase - 07/24/19 1701    Pharyngeal Phase  Pharyngeal Phase Impaired    Pharyngeal - Nectar  Pharyngeal- Nectar Teaspoon Swallow initiation at vallecula;Reduced pharyngeal peristalsis;Reduced airway/laryngeal closure;Penetration/Apiration after swallow;Moderate aspiration;Pharyngeal residue - cp segment   Pharyngeal Material enters airway, passes BELOW cords without attempt by patient to eject out (silent aspiration);Material enters airway, passes BELOW cords and not ejected out despite cough attempt by patient    Pharyngeal - Thin  Pharyngeal- Thin Teaspoon Swallow initiation at vallecula;Pharyngeal residue - cp segment   Pharyngeal- Thin Cup Swallow initiation at vallecula;Swallow initiation at pyriform sinus;Reduced pharyngeal  peristalsis;Penetration/Aspiration during swallow;Penetration/Apiration after swallow;Trace aspiration;Moderate aspiration;Pharyngeal residue - cp segment   Pharyngeal Material enters airway, passes BELOW cords and not ejected out despite cough attempt by patient;Material enters airway, passes BELOW cords without attempt by patient to eject out (silent aspiration)    Pharyngeal - Solids  Pharyngeal- Puree Swallow initiation at vallecula;Reduced pharyngeal peristalsis;Reduced epiglottic inversion;Reduced tongue base retraction;Pharyngeal residue - valleculae;Pharyngeal residue - pyriform;Penetration/Apiration after swallow   Pharyngeal Material enters airway, passes BELOW cords and not ejected out despite cough attempt by patient;Material enters airway, passes BELOW cords without attempt by patient to eject out (silent aspiration)    Electrical Stimulation - Pharyngeal Phase  Was Electrical Stimulation Used No      Cricopharyngeal Phase - 07/24/19 1704    Cervical Esophageal Phase  Cervical Esophageal Phase --   narrowing near C4-5     SLP Short Term Goals - 07/24/19 1707    SLP SHORT TERM GOAL #1  Title Pt will demonstrate safe and efficient consumption of self regulated regular textures with thin liquids with use of strategies as needed.   Baseline Currently consuming a variety of regular textures and all liquids   Time 3   Period Months   Status On-going   Target Date 08/28/19    SLP SHORT TERM GOAL #2  Title Pt will complete pharyngeal swallowing exercises as assigned with use of written cue after initial introduction/model from SLP   Baseline Introduced this date   Time 3   Period Months   Status On-going   Target Date 08/28/19    SLP SHORT TERM GOAL #3  Title Pt will verbalize 3 signs/symptoms of aspiration pneumonia with min assist.   Baseline Introduced this date   Time 3   Period Months   Status On-going   Target Date 08/28/19    SLP SHORT TERM GOAL #4  Title Complete MBSS within the next two weeks  Baseline has not had objective swallow assessment   Time 2   Period Weeks   Status Achieved   Target Date 07/17/19      Plan - 07/24/19 1706   Clinical Impression Statement Pt presents with severe pharyngeal phase dysphagia s/p radiation therapy for supraglottic cancer. Pt demonstrated seemingly normal pharyngeal swallow response with initial tsp presentations of thin barium (vallecular trigger) on two trials, however ensuing trials via cup sip thin, tsp NTL, puree, and back to tsp thin yielded penetration during and aspiration after the swallow variably sensed (cough) and unsensed. Pt with blunted/bulbous epiglottis from post radiation effects, reduced epiglottic deflection with heavier bolus (puree), reduced pharyngeal constriction, reduced laryngeal closure, narrowing near PE segment resulting in stasis of bolus near PES and pyfiroms and aspiration after the swallow from residuals spilling into larynx across consistencies. Question whether the narrowing in pharynx near C4-5 is also due to radiation changes (may benefit from dilation?). Recommend primary source of nutrition via PEG due to high risk for aspiration and continued dysphagia therapy with a focus on Pt taking single tsp presentations of thin water after oral care. Pt should try to wear his PMSV during all waking hours. SLP will try to get records from his most recent ENT appointment.   Speech Therapy Frequency 1x /week   1-2x per month after chemoradiation  Duration 4 weeks   3 months  Treatment/Interventions Aspiration precaution training;SLP instruction and feedback;Pharyngeal strengthening exercises;Compensatory strategies;Patient/family education;Compensatory techniques;Diet toleration management by SLP   MBSS if indicated  Potential to Achieve Goals Good   SLP Home Exercise Plan Pt will complete HEP as assigned to facilitate carryover of treatment strategies and techniques in home environment with written cues   Consulted and Agree with Plan of  Care Patient     Patient will benefit from skilled therapeutic intervention in order to improve the following deficits and impairments:  Dysphagia, oropharyngeal phase  Recommendations/Treatment - 07/24/19 1704    Swallow Evaluation Recommendations  Recommended Consults Consider ENT evaluation   SLP Diet Recommendations NPO   Mare Ferrari free water protocol  Liquid Administration via Spoon   Medication Administration Via alternative means   Supervision Patient able to self feed   Compensations Small sips/bites;Multiple dry swallows after each bite/sip;Hard cough after swallow   Postural Changes Seated upright at 90 degrees;Remain upright for at least 30 minutes after feeds/meals      Prognosis - 07/24/19 1705    Prognosis  Prognosis for Safe Diet Advancement Guarded   Barriers to Reach Goals Severity of deficits    Individuals Consulted  Consulted and Agree with Results and Recommendations Patient   Report Sent to  Referring physician     Problem List Patient Active Problem List  Diagnosis Date Noted . HCAP (healthcare-associated pneumonia) 04/10/2019 . Febrile illness, acute 04/08/2019 . Malnutrition of moderate degree 04/03/2019 . GI bleed 04/01/2019 . Dehydration 03/28/2019 . RLL pneumonia 03/17/2019 . Hx of tracheostomy 02/18/2019 . Head and neck cancer ([place]) 02/18/2019 . Malignant neoplasm of supraglottis ([place]) 01/22/2019 . Stroke ([place]) 07/31/2018 . Rotator cuff syndrome of right shoulder 02/20/2013 . Bradycardia 11/06/2012 . Coronary artery disease  . Abnormal finding on cardiovascular stress test 09/25/2012 . Hypertension 09/03/2012 . Chest pain 09/03/2012 . Alcohol abuse, daily use 09/03/2012 . Hyperlipidemia 09/03/2012 . Hyponatremia 09/03/2012 Thank you, Genene Churn, [place] Genene Churn 07/24/2019, 5:08 PM [place] [address] [place], [place], [address] Phone: [phone]   Fax:  [phone] Name: [name]  MRN: [id number] Date of Birth:  1960/06/17 CLINICAL DATA:  Dysphagia. Laryngeal cancer post radiation therapy, tracheostomy, PEG tube placement, significant neck swelling EXAM: MODIFIED BARIUM SWALLOW TECHNIQUE: Different consistencies of barium were administered orally to the patient by the Speech Pathologist. Imaging of the pharynx was performed in the lateral projection. The radiologist was present in the fluoroscopy room for this study, providing personal supervision. FLUOROSCOPY TIME:  Fluoroscopy Time:  4 minutes 12 seconds Radiation Exposure Index (if provided by the fluoroscopic device): 22.6 mGy Number of Acquired Spot Images: Multiple fluoroscopic series were saved COMPARISON:  02/21/2019 FINDINGS: With thin barium by teaspoon and cup, week hypopharyngeal muscles are seen with laryngeal penetration and aspiration. Aspiration particularly is identified from piriform sinus residuals. Laryngeal penetration also occurred with nectar consistency with piriform sinus residuals. Delayed initiation of swallow was identified with applesauce consistency with vallecular and piriform sinus residuals. Laryngeal penetration aspiration of the applesauce consistency occurred from the residuals. A swallow of thin barium to help past the applesauce also demonstrated laryngeal penetration aspiration. Patient was able to clear a portion of the aspirated contrast by coughing. IMPRESSION: Significant swallowing dysfunction as above. Please refer to the Speech Pathologists report for complete details and recommendations. Electronically Signed   By: Lavonia Dana M.D.   On: 07/24/2019 12:22   DG ABDOMEN PEG TUBE LOCATION  Result Date: 08/08/2019 CLINICAL DATA:  Peg tube position. EXAM: ABDOMEN - 1 VIEW COMPARISON:  CT abdomen 02/19/2019 FINDINGS: Peg tube injected with 30 mL Omnipaque 300. Peg tube tip is in the gastric antrum. The balloon is inflated in the antrum. There is contrast in the stomach and duodenum. No extravasation. IMPRESSION: Peg tube in the  gastric antrum. Electronically Signed   By: Franchot Gallo M.D.   On: 08/08/2019 17:56     CBC Recent Labs  Lab 08/10/19 1538  WBC 7.5  HGB 12.1*  HCT 37.4*  PLT 314  MCV 94.2  MCH 30.5  MCHC 32.4  RDW 13.7  LYMPHSABS 1.0  MONOABS 0.7  EOSABS 0.2  BASOSABS 0.0    Chemistries  Recent Labs  Lab 08/10/19 1538  NA 138  K 3.8  CL 101  CO2 25  GLUCOSE 106*  BUN 14  CREATININE 0.94  CALCIUM 9.9   ------------------------------------------------------------------------------------------------------------------ No results for input(s): CHOL, HDL, LDLCALC, TRIG, CHOLHDL, LDLDIRECT in the last 72 hours.  Lab Results  Component Value Date   HGBA1C 5.4 08/01/2018   ------------------------------------------------------------------------------------------------------------------ No results for input(s): TSH, T4TOTAL, T3FREE, THYROIDAB in the last 72 hours.  Invalid input(s): FREET3 ------------------------------------------------------------------------------------------------------------------ No results for input(s): VITAMINB12, FOLATE, FERRITIN, TIBC, IRON, RETICCTPCT in the last 72 hours.  Coagulation profile No results for input(s): INR, PROTIME in the last 168 hours.  No results for input(s): DDIMER in the last 72 hours.  Cardiac Enzymes No results for input(s): CKMB, TROPONINI, MYOGLOBIN in the last 168 hours.  Invalid input(s): CK ------------------------------------------------------------------------------------------------------------------ No results found for: BNP   Domenic Polite M.D on 08/12/2019 at 3:24 PM  [facility] - Office  [phone]

## 2019-08-13 ENCOUNTER — Encounter (HOSPITAL_COMMUNITY): Payer: Self-pay

## 2019-08-13 DIAGNOSIS — Z9889 Other specified postprocedural states: Secondary | ICD-10-CM

## 2019-08-13 DIAGNOSIS — C321 Malignant neoplasm of supraglottis: Secondary | ICD-10-CM

## 2019-08-13 DIAGNOSIS — Z431 Encounter for attention to gastrostomy: Secondary | ICD-10-CM

## 2019-08-13 DIAGNOSIS — I6322 Cerebral infarction due to unspecified occlusion or stenosis of basilar arteries: Secondary | ICD-10-CM

## 2019-08-13 DIAGNOSIS — I25118 Atherosclerotic heart disease of native coronary artery with other forms of angina pectoris: Secondary | ICD-10-CM

## 2019-08-13 DIAGNOSIS — I10 Essential (primary) hypertension: Secondary | ICD-10-CM

## 2019-08-13 HISTORY — PX: IR REPLC GASTRO/COLONIC TUBE PERCUT W/FLUORO: IMG2333

## 2019-08-13 LAB — GLUCOSE, CAPILLARY
Glucose-Capillary: 113 mg/dL — ABNORMAL HIGH (ref 70–99)
Glucose-Capillary: 131 mg/dL — ABNORMAL HIGH (ref 70–99)
Glucose-Capillary: 97 mg/dL (ref 70–99)

## 2019-08-13 NOTE — Consult Note (Signed)
   [facility] Inpatient Consult   08/13/2019  [name] [birth year] [id number]   [place] [facility] [ACO] Patient:  Patient was assessed for [facility] [facility] for community services. Patient was previously active with [facility].  Met with patient and wife [name] at bedside regarding being restarted with [facility] services. Explained [facility] services with insurance plan of [employer].  Patient and wife states they had never received their insurance card in the mail as they do not receive mail at their street address. She states their mail should be sent to [address] [place], [place] [address] and would like a card sent there, if possible.  States that the rep that signed them up with the plan in [place] was unable to help. Patient given a brochure. Plan:  Will assign patient to a [place] Management Coordinator for complex disease management and to follow up on insurance card need issue.  Of note, [facility] Care Management services does not replace or interfere with any services that are arranged by inpatient [facility] care management team.   For additional questions or referrals please contact:   Natividad Brood, RN BSN [facility] Liaison  [phone] business mobile phone Toll free office [phone]  Fax number: [phone] [email or web address]

## 2019-08-13 NOTE — Plan of Care (Signed)
  Problem: Education: Goal: Knowledge of General Education information will improve Description: Including pain rating scale, medication(s)/side effects and non-pharmacologic comfort measures Outcome: Adequate for Discharge   Problem: Health Behavior/Discharge Planning: Goal: Ability to manage health-related needs will improve Outcome: Adequate for Discharge   Problem: Clinical Measurements: Goal: Ability to maintain clinical measurements within normal limits will improve Outcome: Adequate for Discharge Goal: Will remain free from infection Outcome: Adequate for Discharge Goal: Diagnostic test results will improve Outcome: Adequate for Discharge Goal: Respiratory complications will improve Outcome: Adequate for Discharge Goal: Cardiovascular complication will be avoided Outcome: Adequate for Discharge   Problem: Pain Managment: Goal: General experience of comfort will improve Outcome: Adequate for Discharge   Problem: Safety: Goal: Ability to remain free from injury will improve Outcome: Adequate for Discharge

## 2019-08-13 NOTE — Discharge Summary (Signed)
Physician Discharge Summary  MADYX DELFIN YCX:[id number] DOB: 1960/09/25 DOA: 08/10/2019  PCP: [name], MD  Admit date: 08/10/2019 Discharge date: 08/13/2019  Admitted From: Home  Discharge disposition: Home   Recommendations for Outpatient Follow-Up:   . Follow up with your primary care provider in one week.  . Check CBC, BMP , mg in the next visit  Discharge Diagnosis:   Principal Problem:   PEG tube malfunction ([place]) Active Problems:   Hypertension   Coronary artery disease   Stroke [facility])   Malignant neoplasm of supraglottis ([place])   Hx of tracheostomy   PEG (percutaneous endoscopic gastrostomy) adjustment/replacement/removal ([place])    Discharge Condition: Improved.  Diet recommendation:  Tube feeding  Wound care: None.  Code status: Full.   History of Present Illness:   59 year old with past medical history of malignant neoplasm of supraglottis, status post tracheostomy, status post prior chemotherapy and radiation; dysphagia status post PEG tube placement, history of CAD, s/p  PCI with stenting in 2014, history of nonhemorrhagic CVA, chronic lower back pain, essential hypertension, hyperlipidemia was admitted on 08/10/2019 with PEG malfunction/PEG fell out , originally placed by Dr. Redmond Pulling on 02/24/2019. Patient was seen in the ED on 8/6 after the plastic tip of the cap of his G-tube became dislodged and fell into the G-tube, this was replaced in the emergency room and a postprocedure x-ray showed good placement of the PEG tube and discharged home.patient was again seen back in the ED on 8/8, tube fell out overnight and patient noticed this upon waking up the following day.General surgeon Dr. Arnoldo Morale attempted to replace PEG tube, unable to perform endoscopy and place PEG tube due to esophageal strictures/neck radiation malignancy etc.Subsequently, transferred to [facility] 8/10 for IR consultation for PEG tube replacement   Hospital Course:   Following  conditions were addressed during hospitalization as listed below,  PEG tube malfunction Status post IR replacement of the PEG tube. Has been tolerating canned nutritional supplements after placement.  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 Status post tracheostomy with trach collar, stable. Follows-up with Dr. Delton Coombes at [place]  History of CVA Resume Lipitor and aspirin  History of CAD aspirin Lipitor to continue  Disposition.  At this time, patient is stable for disposition home.  Medical Consultants:   Gen Surg/IR  Procedures:    PEG tube exchange by interventional radiology 8/10 Subjective:   Today, patient feels ok. Wants to go home. No cough, shortness of breath, tolerated tube feeding.  Discharge Exam:   Vitals:   08/13/19 0857 08/13/19 0915  BP: 109/64   Pulse: 89 86  Resp: 16 16  Temp: 98.2 F (36.8 C)   SpO2: 96% 96%   Vitals:   08/13/19 0307 08/13/19 0402 08/13/19 0857 08/13/19 0915  BP:  (!) 148/88 109/64   Pulse: (!) 50 91 89 86  Resp: _0 Temp:  98.5 F (36.9 C) 98.2 F (36.8 C)   TempSrc:  Oral Oral   SpO2: 96% 96% 96% 96%  Weight:      Height:       Body mass index is 20.9 kg/m.   General: Alert awake, not in obvious distress, thinly built HENT: pupils equally reacting to light,  No scleral pallor or icterus noted. Oral mucosa is moist. Tracheostomy with trach collar. Chest:  Clear breath sounds.  Diminished breath sounds bilaterally. No crackles or wheezes.  CVS: S1 &S2 heard. No murmur.  Regular rate  and rhythm. Abdomen: Soft, nontender, nondistended.  Bowel sounds are heard.  PEG tube in place. Extremities: No cyanosis, clubbing or edema.  Peripheral pulses are palpable. Psych: Alert, awake and oriented, normal mood CNS: Moves all extremities. Skin: Warm and dry.  No rashes noted.  The results of significant diagnostics from this hospitalization (including imaging, microbiology,  ancillary and laboratory) are listed below for reference.     Diagnostic Studies:   CT ABDOMEN WO CONTRAST  Result Date: 08/11/2019 CLINICAL DATA:  Acute nonlocalized abdominal pain. Head fell out yesterday. EXAM: CT ABDOMEN WITHOUT CONTRAST TECHNIQUE: Multidetector CT imaging of the abdomen was performed following the standard protocol without IV contrast. COMPARISON:  Abdominal CT 02/19/2019 FINDINGS: Lower chest: Motion artifact limits detailed assessment. Probable dependent atelectasis at the left lung base. Heart is normal in size. Small pericardial effusion anteriorly measures up to 12 mm in depth. Hepatobiliary: Tiny subcentimeter hypodensity in the right lobe of the liver, series 3, image 23, not definitively seen on prior exam. Gallbladder is unremarkable. Pancreas: No ductal dilatation or inflammation. Spleen: Normal in size without focal abnormality. Stable splenule anteriorly at the hilum. Adrenals/Urinary Tract: Normal adrenal glands. Symmetric bilateral perinephric edema that is similar to prior exam. There is no hydronephrosis. No visualized renal calculi. Vascular calcifications at the hilum. No evidence of focal renal lesion on noncontrast exam. Stomach/Bowel: Distal esophagus is unremarkable. Small fluid level in the stomach. There is no gastric wall thickening. Minimal soft tissue density adjacent to the anterior greater curvature likely site of prior gastrostomy tube. Small foci of air in the overlying subcutaneous soft tissues but no free intra-abdominal air. The stomach abuts the anterior abdominal wall. Included small bowel is unremarkable. There is contrast throughout the colon. No acute bowel inflammation. Vascular/Lymphatic: Aortic atherosclerosis. Branch atherosclerosis. No aortic aneurysm. No abdominal adenopathy. Other: Minimal soft tissue tract in the anterior abdominal wall just to the left of midline consistent with prior gastrostomy tube, series 3, image 29. few foci of  adjacent air in the subcutaneous tissues and anterior abdominal wall musculature. No focal fluid collection. No pneumoperitoneum. No ascites or free fluid in the upper abdomen. Musculoskeletal: Degenerative change in the included spine. No focal bone lesion or acute osseous abnormality. IMPRESSION: 1. Minimal soft tissue density tract adjacent to the anterior greater curvature of the stomach likely site of prior gastrostomy tube. Small foci of air in the overlying subcutaneous tissues and anterior abdominal wall musculature but no free intra-abdominal air or focal fluid collection. 2. Small pericardial effusion measuring up to 12 mm in depth. 3. Tiny subcentimeter hypodensity in the right lobe of the liver, not definitively seen on prior exam. This is nonspecific and incompletely characterized in the absence of contrast, particularly given small size. Aortic Atherosclerosis (ICD10-I70.0). Electronically Signed   By: Keith Rake M.D.   On: 08/11/2019 16:29     Labs:   Basic Metabolic Panel: Recent Labs  Lab 08/10/19 1538  NA 138  K 3.8  CL 101  CO2 25  GLUCOSE 106*  BUN 14  CREATININE 0.94  CALCIUM 9.9   GFR Estimated Creatinine Clearance: 85.7 mL/min (by C-G formula based on SCr of 0.94 mg/dL). Liver Function Tests: No results for input(s): AST, ALT, ALKPHOS, BILITOT, PROT, ALBUMIN in the last 168 hours. No results for input(s): LIPASE, AMYLASE in the last 168 hours. No results for input(s): AMMONIA in the last 168 hours. Coagulation profile No results for input(s): INR, PROTIME in the last 168 hours.  CBC: Recent  Labs  Lab 08/10/19 1538  WBC 7.5  NEUTROABS 5.6  HGB 12.1*  HCT 37.4*  MCV 94.2  PLT 314   Cardiac Enzymes: No results for input(s): CKTOTAL, CKMB, CKMBINDEX, TROPONINI in the last 168 hours. BNP: Invalid input(s): POCBNP CBG: Recent Labs  Lab 08/11/19 1835 08/11/19 2119 08/12/19 1810 08/13/19 0001 08/13/19 0714  GLUCAP 94 79 98 131* 97    D-Dimer No results for input(s): DDIMER in the last 72 hours. Hgb A1c No results for input(s): HGBA1C in the last 72 hours. Lipid Profile No results for input(s): CHOL, HDL, LDLCALC, TRIG, CHOLHDL, LDLDIRECT in the last 72 hours. Thyroid function studies No results for input(s): TSH, T4TOTAL, T3FREE, THYROIDAB in the last 72 hours.  Invalid input(s): FREET3 Anemia work up No results for input(s): VITAMINB12, FOLATE, FERRITIN, TIBC, IRON, RETICCTPCT in the last 72 hours. Microbiology Recent Results (from the past 240 hour(s))  SARS Coronavirus 2 by RT PCR (hospital order, performed in [facility] hospital lab) Nasopharyngeal Nasopharyngeal Swab     Status: None   Collection Time: 08/10/19  3:04 PM   Specimen: Nasopharyngeal Swab  Result Value Ref Range Status   SARS Coronavirus 2 NEGATIVE NEGATIVE Final    Comment: (NOTE) SARS-CoV-2 target nucleic acids are NOT DETECTED.  The SARS-CoV-2 RNA is generally detectable in upper and lower respiratory specimens during the acute phase of infection. The lowest concentration of SARS-CoV-2 viral copies this assay can detect is 250 copies / mL. A negative result does not preclude SARS-CoV-2 infection and should not be used as the sole basis for treatment or other patient management decisions.  A negative result may occur with improper specimen collection / handling, submission of specimen other than nasopharyngeal swab, presence of viral mutation(s) within the areas targeted by this assay, and inadequate number of viral copies (<250 copies / mL). A negative result must be combined with clinical observations, patient history, and epidemiological information.  Fact Sheet for Patients:   StrictlyIdeas.no  Fact Sheet for Healthcare Providers: BankingDealers.co.za  This test is not yet approved or  cleared by the [place] FDA and has been authorized for detection and/or diagnosis of  SARS-CoV-2 by FDA under an Emergency Use Authorization (EUA).  This EUA will remain in effect (meaning this test can be used) for the duration of the COVID-19 declaration under Section 564(b)(1) of the Act, 21 U.S.C. section 360bbb-3(b)(1), unless the authorization is terminated or revoked sooner.  Performed at [facility], [address], [place], [place] [address]      Discharge Instructions:   Discharge Instructions    Call MD for:  persistant nausea and vomiting   Complete by: As directed    Call MD for:  severe uncontrolled pain   Complete by: As directed    Diet - low sodium heart healthy   Complete by: As directed    Discharge instructions   Complete by: As directed    Follow up with your primary care physician in one week.   Discharge wound care:   Complete by: As directed    Resume care at home   Increase activity slowly   Complete by: As directed      Allergies as of 08/13/2019      Reactions   Duloxetine Other (See Comments)   Caused depression   Prednisone Other (See Comments)   Chest pain      Medication List    TAKE these medications   acetaminophen 325 MG tablet Commonly known as: TYLENOL  Take 2 tablets (650 mg total) by mouth every 6 (six) hours as needed for mild pain, fever or headache (or Fever >/= 101).   [place] Suction Catheter 14FR Misc 1 Device by Does not apply route daily.   aspirin EC 81 MG tablet Take 325 mg by mouth daily.   atorvastatin 80 MG tablet Commonly known as: LIPITOR Take 1 tablet (80 mg total) by mouth every evening. What changed:   how much to take  how to take this  when to take this   diazepam 10 MG tablet Commonly known as: VALIUM Place 10 mg into feeding tube 3 (three) times daily as needed.   diphenoxylate-atropine 2.5-0.025 MG tablet Commonly known as: LOMOTIL Place 2 tablets into feeding tube 4 (four) times daily as needed for diarrhea or loose stools.   famotidine 20 MG tablet Commonly known as:  PEPCID Place 20 mg into feeding tube daily.   feeding supplement (OSMOLITE 1.5 CAL) Liqd Give 2 cartons of osmolite 1.5 at 9:30am, 1:30, 5:30 and 1 carton at 9:30pm.  Flush with 55m of water before and 1235mafter each feeding. Meets 100% of patient needs. Send bolus supplies. What changed:   how much to take  how to take this  when to take this   feeding supplement (PROSource TF) liquid Give 3075m times per day via feeding tube.  Mix with 43m52m water.  Flush tube with 43ml87mwater, then place prosource and water mixture in tube and flush with 43ml 80mater after. What changed:   how much to take  how to take this  when to take this  additional instructions   feeding supplement (PRO-STAT SUGAR FREE 64) Liqd Place 30 mLs into feeding tube 3 (three) times daily with meals.   free water Soln Place 100 mLs into feeding tube See admin instructions. Please Flush PEG tube with 1 ounce (30 ml) before giving to feed, and then flush with 3 ounces (90ml) 56mr giving to feed--- up to 4 times a day   HYDROcodone-acetaminophen 10-325 MG tablet Commonly known as: NORCO Take 1 tablet by mouth every 4 (four) hours as needed.   lidocaine 2 % solution Commonly known as: XYLOCAINE Patient: Mix 1part 2% viscous lidocaine, 1part H20. Swish & swallow 10mL of38muted mixture, 30min be46m meals and at bedtime, up to QID   nitroGLYCERIN 0.4 MG SL tablet Commonly known as: NITROSTAT Place 1 tablet (0.4 mg total) under the tongue every 5 (five) minutes as needed for chest pain (CP or SOB).   omeprazole 20 MG capsule Commonly known as: PRILOSEC Take 20 mg by mouth daily.   potassium chloride 20 MEQ/15ML (10%) Soln Place 15 mLs (20 mEq total) into feeding tube daily for 5 days.   prochlorperazine 10 MG tablet Commonly known as: COMPAZINE TAKE 1 TABLET(10 MG) BY MOUTH EVERY 6 HOURS AS NEEDED FOR NAUSEA What changed: See the new instructions.   Tracheostomy Care Kit 1 kit by Does not  apply route daily.   traMADol 50 MG tablet Commonly known as: ULTRAM Take 50-100 mg by mouth 4 (four) times daily as needed (pain.).            Discharge Care Instructions  (From admission, onward)         Start     Ordered   08/13/19 0000  Discharge wound care:       Comments: Resume care at home   08/13/19 1008  Follow-up Information    [facility] .   Specialty: Radiology Contact information: [address] [id number] Prudy Feeler [place]       [name], MD. Schedule an appointment as soon as possible for a visit in 1 week(s).   Specialty: Internal Medicine Why: regular follow up Contact information: [place] [address] [phone]        Herminio Commons, MD .   Specialty: Cardiology Contact information: [place] [address] [phone]                Time coordinating discharge: 39 minutes  Signed:  Laxman Pokhrel  [facility] 08/13/2019, 10:08 AM

## 2019-08-18 ENCOUNTER — Other Ambulatory Visit: Payer: Self-pay

## 2019-08-18 ENCOUNTER — Other Ambulatory Visit: Payer: Self-pay | Admitting: *Deleted

## 2019-08-18 ENCOUNTER — Ambulatory Visit (HOSPITAL_COMMUNITY)
Admission: RE | Admit: 2019-08-18 | Discharge: 2019-08-18 | Disposition: A | Discharge status: Home / Self Care | Payer: 59 | Source: Ambulatory Visit | Attending: Radiation Oncology | Admitting: Radiation Oncology

## 2019-08-18 DIAGNOSIS — C321 Malignant neoplasm of supraglottis: Secondary | ICD-10-CM | POA: Diagnosis not present

## 2019-08-18 LAB — GLUCOSE, CAPILLARY: Glucose-Capillary: 92 mg/dL (ref 70–99)

## 2019-08-18 MED ORDER — FLUDEOXYGLUCOSE F - 18 (FDG) INJECTION
7.4200 | Freq: Once | INTRAVENOUS | Status: AC | PRN
  Administered 2019-08-18: 7.42 via INTRAVENOUS

## 2019-08-19 ENCOUNTER — Other Ambulatory Visit: Payer: Self-pay

## 2019-08-19 ENCOUNTER — Telehealth: Payer: Self-pay

## 2019-08-19 ENCOUNTER — Encounter: Payer: Self-pay | Admitting: *Deleted

## 2019-08-19 ENCOUNTER — Ambulatory Visit
Admission: RE | Admit: 2019-08-19 | Discharge: 2019-08-19 | Disposition: A | Discharge status: Home / Self Care | Payer: 59 | Source: Ambulatory Visit | Attending: Radiation Oncology | Admitting: Radiation Oncology

## 2019-08-19 ENCOUNTER — Inpatient Hospital Stay: Payer: 59

## 2019-08-19 ENCOUNTER — Other Ambulatory Visit: Payer: Self-pay | Admitting: *Deleted

## 2019-08-19 VITALS — BP 118/81 | HR 68 | Temp 98.3°F | Resp 18 | Ht 72.0 in | Wt 156.0 lb

## 2019-08-19 DIAGNOSIS — Z9221 Personal history of antineoplastic chemotherapy: Secondary | ICD-10-CM | POA: Diagnosis not present

## 2019-08-19 DIAGNOSIS — Z7982 Long term (current) use of aspirin: Secondary | ICD-10-CM | POA: Diagnosis not present

## 2019-08-19 DIAGNOSIS — C321 Malignant neoplasm of supraglottis: Secondary | ICD-10-CM | POA: Insufficient documentation

## 2019-08-19 DIAGNOSIS — I313 Pericardial effusion (noninflammatory): Secondary | ICD-10-CM | POA: Diagnosis not present

## 2019-08-19 DIAGNOSIS — Z79899 Other long term (current) drug therapy: Secondary | ICD-10-CM | POA: Insufficient documentation

## 2019-08-19 DIAGNOSIS — Z8673 Personal history of transient ischemic attack (TIA), and cerebral infarction without residual deficits: Secondary | ICD-10-CM | POA: Insufficient documentation

## 2019-08-19 DIAGNOSIS — Z923 Personal history of irradiation: Secondary | ICD-10-CM | POA: Insufficient documentation

## 2019-08-19 DIAGNOSIS — R109 Unspecified abdominal pain: Secondary | ICD-10-CM | POA: Diagnosis not present

## 2019-08-19 DIAGNOSIS — I700 Atherosclerosis of aorta: Secondary | ICD-10-CM | POA: Insufficient documentation

## 2019-08-19 NOTE — Progress Notes (Signed)
Oncology Nurse Navigator Documentation  I met with [name] and his wife during his follow up with Dr. Squire today. PET results were provided by Dr. Squire. I have ordered a PT referral for neck lymphedema at Dr. Squire's request. He will be scheduled with Dr. Katragadda in 3 months, and then see Dr. Squire in 6 months for results of a CT chest. They know to call me if they have any further questions or concerns.   Jennifer Malmfelt RN, BSN, OCN Head & Neck Oncology Nurse Navigator [facility] Phone # [phone]  Fax # [phone] 

## 2019-08-19 NOTE — Progress Notes (Signed)
[name] presents today after completing radiation to the larynx on 05/08/2019 and to review results from PET scan on 08/18/2019  Pain issues, if any: Constant lower back pain. Headaches from last visit seemed to have resolved. Using a feeding tube?: Yes--uses anywhere from 3-5 times a day without any difficulty; had to have PEG replaced due to multiple malfunctions/dislodgement. Reports feeling boated on occasion after feedings Weight changes, if any:  Wt Readings from Last 3 Encounters:  08/19/19 156 lb (70.8 kg)  08/12/19 154 lb 1.6 oz (69.9 kg)  08/08/19 151 lb (68.5 kg)   Swallowing issues, if any: Yes--continues to have difficulty with solid foods. 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 Pt demonstrated seemingly normal pharyngeal swallow response with initial tsp presentations of thin barium (vallecular trigger) on two trials, however ensuing trials via cup sip thin, tsp NTL, puree, and back to tsp thin yielded penetration during and aspiration after the swallow variably sensed (cough) and unsensed. Pt with blunted/bulbous epiglottis from post radiation effects, reduced epiglottic deflection with heavier bolus (puree), reduced pharyngeal constriction, reduced laryngeal closure, narrowing near PE segment resulting in stasis of bolus near PES and pyfiroms and aspiration after the swallow from residuals spilling into larynx across consistencies. Question whether the narrowing in pharynx near C4-5 is also due to radiation changes (may benefit from dilation?). Recommend primary source of nutrition via PEG due to high risk for aspiration and continued dysphagia therapy with a focus on Pt taking single tsp presentations of thin water after oral care. Pt should try to wear his PMSV during all waking hours."  Smoking or chewing tobacco? None Using fluoride trays daily? N/A; Saw Dr. Teena Dunk on 06/09/2019:  "Patient to be referred to an oral surgeon for removal of the bony spicule on the area of #20/21 prior to follow-up with a dentist of his choice for fabrication of upper and lower complete dentures after adequate healing.  Denture fabrication is to start no earlier than 08/07/2019." Last ENT visit was on: Saw Dr. [name] about 3 weeks ago. Patient states Dr. Benjamine Mola wants to see him again after today's visit with Dr. Isidore Moos, and that he's orders a CT of neck (scheduled for 09/05/19 at [facility]) Other notable issues, if any: Continues to deal with dry mouth and altered taste. Prominent lymphedema present today. Still coughing up thick secretions through trach.    Vitals:   08/19/19 1436  BP: 118/81  Pulse: 68  Resp: 18  Temp: 98.3 F (36.8 C)  SpO2: 97%

## 2019-08-19 NOTE — Telephone Encounter (Signed)
Left a message to verify telephone visit for pre reg
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
## 2019-08-20 ENCOUNTER — Telehealth: Payer: Self-pay | Admitting: *Deleted

## 2019-08-20 ENCOUNTER — Inpatient Hospital Stay: Payer: 59 | Attending: Radiation Oncology

## 2019-08-20 ENCOUNTER — Encounter: Payer: Self-pay | Admitting: Nurse Practitioner

## 2019-08-20 ENCOUNTER — Other Ambulatory Visit: Payer: Self-pay | Admitting: Radiation Oncology

## 2019-08-20 DIAGNOSIS — C321 Malignant neoplasm of supraglottis: Secondary | ICD-10-CM

## 2019-08-20 NOTE — Telephone Encounter (Signed)
RECEIVED PHONE CALL FROM [facility]. STATING THAT THEY THIS PT. SCHEDULED ON 09/30/19 @ 3 PM FOR CONSULTATION

## 2019-08-20 NOTE — Telephone Encounter (Signed)
APPT. WITH PAULA GUENTHER ON 9-28- @ 3 PM FOR CONSULTATION ([facility])

## 2019-08-20 NOTE — Progress Notes (Signed)
Nutrition Follow-up:  Patient with supraglottic carcinoma.  Patient has completed chemotherapy and radiation.  Last radiation treatment was on 5/6.  Spoke with wife via phone for nutrition follow-up, patient is not awake yet.  Wife reports that patient has been taking mostly 5 cartons of osmolite 1.5 and 42ml of prostate daily.  Wife states that patient is trying to get up to 6 cartons per day and knows goal.  Noted recent hospital visit due to PEG tube malfunction.  Wife reports that patient has been eating ice cream and really like the ice cream he was given in the hospital (??magic cup).  Wife unsure about bowel movement.  Wife states that he has had bowel movement since being out of the hospital.    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    Wife states that SLP is to call her tomorrow to set up another appointment. Noted SLP recommendations from 7/22 visit "primary source of nutrition via PEG due to high risk for aspiration and continued dysphagia therapy with a focus on Pt taking single tsp presentations of thin water after oral care. Pt should try to wear his PMSV during all waking hours."    Medications: reviewed  Labs: reviewed  Anthropometrics:   Weight 156 lb on 8/17 increased from home weight on 7/28 of 151 lb (home scale.   158 lb on 6/22 per chart   Estimated Energy Needs  Kcals: 9562-1308 Protein: 120-140 g Fluid: > 2.4 L  NUTRITION DIAGNOSIS: Predicted suboptimal energy intake continues   INTERVENTION:  Discussed with wife patient to add 1/2 carton of tube feeding daily until he reaches 6 cartons.  Continue prostat 92ml daily for added protein.  Wife to schedule f/u appointment with SLP.  Encouraged getting SLP approval for foods eaten orally other than water.   Wife has contact information    MONITORING, EVALUATION, GOAL: weight trends, intake, tube feeding   NEXT VISIT: Sept 15 phone f/u  Ortencia Askari B. [name], [place], [place] Registered  Dietitian [phone] (mobile)

## 2019-08-20 NOTE — Progress Notes (Signed)
Radiation Oncology         [phone] ________________________________  Name: [name] MRN: [id number]  Date: 08/19/2019  DOB: 1960/11/11  Follow-Up Visit Note In person  CC: [name], MD  [name], MD  Diagnosis and Prior Radiotherapy:       ICD-10-CM   1. 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 Headaches from last visit seemed to have resolved.  Using a feeding tube?: Yes--uses anywhere from 3-5 times a day without any difficulty; had to have PEG replaced due to multiple malfunctions/dislodgement. Reports feeling boated on occasion after feedings  Weight changes, if any:  Wt Readings from Last 3 Encounters:  08/19/19 156 lb (70.8 kg)  08/12/19 154 lb 1.6 oz (69.9 kg)  08/08/19 151 lb (68.5 kg)   Swallowing issues, if any: Yes--continues to have difficulty with solid foods. 07/24/2019 Saw Dabney Porter-SLP   Smoking or chewing tobacco? None  Using fluoride trays daily? N/A; Saw Dr. Teena Dunk on 06/09/2019: "Patient to be referred to an oral surgeon for removal of the bony spicule on the area of #20/21 prior to follow-up with a dentist of his choice for fabrication of upper and lower complete dentures after adequate healing.  Denture fabrication is to start no earlier than 08/07/2019."  Last ENT visit was on: Saw Dr. [name] about 3 weeks ago. Patient states Dr. Benjamine Mola wants to see him again after today's visit with Dr. Isidore Moos, and that he's ordered a CT of neck (scheduled for  09/05/19 at [facility])  Other notable issues, if any: Continues to deal with dry mouth and altered taste. Prominent lymphedema present today. Still coughing up thick secretions through trach.     ALLERGIES:  is allergic to duloxetine and prednisone.  Meds: Current Outpatient Medications  Medication Sig Dispense Refill  . acetaminophen (TYLENOL) 325 MG tablet Take 2 tablets (650 mg total) by mouth every 6 (six) hours as needed for mild pain, fever or headache (or Fever >/= 101). 12 tablet 0  . Amino Acids-Protein Hydrolys (FEEDING SUPPLEMENT, PRO-STAT SUGAR FREE 64,) LIQD Place 30 mLs into feeding tube 3 (three) times daily with meals. 887 mL 2  . aspirin EC 81 MG tablet Take 325 mg by mouth daily.     Marland Kitchen atorvastatin (LIPITOR) 80 MG tablet Take 1 tablet (80 mg total) by mouth every evening. (Patient taking differently: Place 40 mg into feeding tube in the morning and at bedtime. ) 90 tablet 3  . Catheters (ARGYLE SUCTION CATHETER 14FR) MISC 1 Device by Does not apply route daily. 100 each 0  . diazepam (VALIUM) 10 MG tablet Place 10 mg into feeding tube 3 (three) times daily as needed.     . diphenoxylate-atropine (LOMOTIL) 2.5-0.025 MG tablet Place 2 tablets into feeding tube 4 (four) times daily as needed for diarrhea or loose stools.     . famotidine (PEPCID) 20 MG tablet Place 20 mg into feeding tube daily.     Marland Kitchen HYDROcodone-acetaminophen (NORCO) 10-325 MG tablet Take 1 tablet  by mouth every 4 (four) hours as needed. 50 tablet 0  . lidocaine (XYLOCAINE) 2 % solution Patient: Mix 1part 2% viscous lidocaine, 1part H20. Swish & swallow 63m of diluted mixture, 360m before meals and at bedtime, up to QID 200 mL 4  . nitroGLYCERIN (NITROSTAT) 0.4 MG SL tablet Place 1 tablet (0.4 mg total) under the tongue every 5 (five) minutes as needed for chest pain (CP or SOB). 25 tablet 3  . Nutritional Supplements (FEEDING SUPPLEMENT, OSMOLITE 1.5 CAL,) LIQD Give 2 cartons of osmolite 1.5 at 9:30am, 1:30, 5:30 and 1  carton at 9:30pm.  Flush with 6021mf water before and 120m70mter each feeding. Meets 100% of patient needs. Send bolus supplies. (Patient taking differently: Place 1,000 mLs into feeding tube in the morning, at noon, in the evening, and at bedtime. Give 2 cartons of osmolite 1.5 at 9:30am, 1:30, 5:30 and 1 carton at 9:30pm.  Flush with 60ml20mwater before and 120ml 40mr each feeding. Meets 100% of patient needs. Send bolus supplies.) 1659 mL 0  . Nutritional Supplements (PROSOURCE TF) LIQD Give 30ml 290mes per day via feeding tube.  Mix with 60ml of17mer.  Flush tube with 60ml of 14mr, then place prosource and water mixture in tube and flush with 60ml of w77m after. (Patient taking differently: Place 30 mLs into feeding tube in the morning and at bedtime. Mix with 60ml of wa47m  Flush tube with 60ml of wat41mthen place prosource and water mixture in tube and flush with 60ml of wate69mter.) 60 mL 3  . omeprazole (PRILOSEC) 20 MG capsule Take 20 mg by mouth daily.     . Tracheostomy Care KIT 1 kit by Does not apply route daily. 1 kit 0  . traMADol (ULTRAM) 50 MG tablet Take 50-100 mg by mouth 4 (four) times daily as needed (pain.).     . Water For IMarland Kitchenrigation, Sterile (FREE WATER) SOLN Place 100 mLs into feeding tube See admin instructions. Please Flush PEG tube with 1 ounce (30 ml) before giving to feed, and then flush with 3 ounces (90ml) after g73mg to feed--- up to 4 times a day 1000 mL 23  . prochlorperazine (COMPAZINE) 10 MG tablet TAKE 1 TABLET(10 MG) BY MOUTH EVERY 6 HOURS AS NEEDED FOR NAUSEA 60 tablet 2   No current facility-administered medications for this encounter.    Physical Findings: The patient is in no acute distress. Patient is alert and oriented. Wt Readings from Last 3 Encounters:  08/19/19 156 lb (70.8 kg)  08/12/19 154 lb 1.6 oz (69.9 kg)  08/08/19 151 lb (68.5 kg)    height is 6' (1.829 m) and weight is 156 lb (70.8 kg). His oral temperature is 98.3 F (36.8 C).  His blood pressure is 118/81 and his pulse is 68. His respiration is 18 and oxygen saturation is 97%. .  General: Alert and oriented, in no acute distress HEENT: Head is normocephalic. Extraocular movements are intact. Oropharynx is notable for no thrush, no visible tumor Neck: Neck is notable for satisfactory healing of the skin.  He has significant lymphedema in the neck.  Trach is intacLurline Idolo palpable adenopathy in the neck. MSK: He presents in a wheelchair   Lab Findings: Lab Results  Component Value Date   WBC 7.5 08/10/2019   HGB 12.1 (L) 08/10/2019   HCT 37.4 (L) 08/10/2019   MCV 94.2 08/10/2019   PLT 314 08/10/2019    No results found for: TSH  Radiographic Findings: CT ABDOMEN WO CONTRAST  Result Date: 08/11/2019 CLINICAL DATA:  Acute nonlocalized abdominal pain. Head fell out yesterday. EXAM: CT ABDOMEN WITHOUT CONTRAST TECHNIQUE: Multidetector CT imaging of the abdomen was performed following the standard protocol without IV contrast. COMPARISON:  Abdominal CT 02/19/2019 FINDINGS: Lower chest: Motion artifact limits detailed assessment. Probable dependent atelectasis at the left lung base. Heart is normal in size. Small pericardial effusion anteriorly measures up to 12 mm in depth. Hepatobiliary: Tiny subcentimeter hypodensity in the right lobe of the liver, series 3, image 23, not definitively seen on prior exam. Gallbladder is unremarkable. Pancreas: No ductal dilatation or inflammation. Spleen: Normal in size without focal abnormality. Stable splenule anteriorly at the hilum. Adrenals/Urinary Tract: Normal adrenal glands. Symmetric bilateral perinephric edema that is similar to prior exam. There is no hydronephrosis. No visualized renal calculi. Vascular calcifications at the hilum. No evidence of focal renal lesion on noncontrast exam. Stomach/Bowel: Distal esophagus is unremarkable. Small fluid level in the stomach. There is no gastric wall thickening. Minimal soft tissue density  adjacent to the anterior greater curvature likely site of prior gastrostomy tube. Small foci of air in the overlying subcutaneous soft tissues but no free intra-abdominal air. The stomach abuts the anterior abdominal wall. Included small bowel is unremarkable. There is contrast throughout the colon. No acute bowel inflammation. Vascular/Lymphatic: Aortic atherosclerosis. Branch atherosclerosis. No aortic aneurysm. No abdominal adenopathy. Other: Minimal soft tissue tract in the anterior abdominal wall just to the left of midline consistent with prior gastrostomy tube, series 3, image 29. few foci of adjacent air in the subcutaneous tissues and anterior abdominal wall musculature. No focal fluid collection. No pneumoperitoneum. No ascites or free fluid in the upper abdomen. Musculoskeletal: Degenerative change in the included spine. No focal bone lesion or acute osseous abnormality. IMPRESSION: 1. Minimal soft tissue density tract adjacent to the anterior greater curvature of the stomach likely site of prior gastrostomy tube. Small foci of air in the overlying subcutaneous tissues and anterior abdominal wall musculature but no free intra-abdominal air or focal fluid collection. 2. Small pericardial effusion measuring up to 12 mm in depth. 3. Tiny subcentimeter hypodensity in the right lobe of the liver, not definitively seen on prior exam. This is nonspecific and incompletely characterized in the absence of contrast, particularly given small size. Aortic Atherosclerosis (ICD10-I70.0). Electronically Signed   By: Keith Rake M.D.   On: 08/11/2019 16:29   DG OP Swallowing Func-Medicare/Speech Path  Result Date: 07/28/2019 [place], [place], [address] Phone: [phone]   Fax:  [phone] Modified Barium Swallow Patient Details Name: KAMAREE WHEATLEY MRN: [id number] Date of Birth: 07/09/1960 No data recorded Encounter Date: 07/24/2019  End of Session  - 07/24/19 1706   Visit Number 3   Number of Visits 6   Date for SLP Re-Evaluation 08/28/19   Authorization Type [employer]   no deductible, OOP $900, copay $10, 30 visit limit, auth required  SLP Start Time 1140   SLP Stop Time  1207   SLP Time Calculation (min) 27 min   Activity Tolerance Patient tolerated treatment well     Past Medical History: Diagnosis Date . Anxiety  . Arthritis   back . Bradycardia   a. During 11/2012 adm: lopressor decreased. . Cancer of larynx ([place])  . Carotid artery stenosis  . Chronic lower back pain   "I work [employer]; messed back up ~ 30 yr ago; paralyzed for 2 days  then" (11/05/2012) . Coronary artery disease   a. Diag cath 10/2012 for CP/abnormal nuc -> planned PCI s/p LAD atherectomy/DES placement 11/05/12. Marland Kitchen GERD (gastroesophageal reflux disease)  . History of hiatal hernia  . Hypercholesteremia  . Hypertension  . Stroke ([place]) 07/31/2018  bilateral vertebrobasilar occlusion; "left side weaker thanit was before." Past Surgical History: Procedure Laterality Date . CARDIAC CATHETERIZATION  10/29/2012 . CORONARY ANGIOPLASTY WITH STENT PLACEMENT  11/05/2012 . DIRECT LARYNGOSCOPY N/A 01/02/2019  Procedure: MICRO DIRECT LARYNGOSCOPY BIOPSY OF LARYNGEAL MASS;  Surgeon: [name], MD;  Location: [facility];  Service: ENT;  Laterality: N/A; . HEMORRHOID SURGERY  ~ 2010 . INSERTION OF MESH N/A 06/02/2015  Procedure: INSERTION OF MESH;  Surgeon: [name], MD;  Location: [facility];  Service: General;  Laterality: N/A; . IR ANGIO INTRA EXTRACRAN SEL COM CAROTID INNOMINATE BILAT MOD SED  08/02/2018 . IR ANGIO VERTEBRAL SEL VERTEBRAL BILAT MOD SED  08/02/2018 . LAPAROSCOPIC INSERTION GASTROSTOMY TUBE N/A 02/24/2019  Procedure: LAPAROSCOPIC INSERTION GASTROSTOMY TUBE;  Surgeon: [name], MD;  Location: [facility];  Service: General;  Laterality: N/A; . LEFT HEART CATHETERIZATION WITH CORONARY ANGIOGRAM N/A 10/30/2012  Procedure: LEFT HEART CATHETERIZATION WITH CORONARY ANGIOGRAM;  Surgeon: [name], MD;  Location: [facility];  Service: Cardiovascular;  Laterality: N/A; . MULTIPLE EXTRACTIONS WITH ALVEOLOPLASTY N/A 02/18/2019  Procedure: Extraction of tooth #'s 5-7, 10,11,14,20-29, 31 and 32 with alveoloplasty and maxiillary left buccal exostoses reductions;  Surgeon: [name], [name];  Location: [facility];  Service: Oral Surgery;  Laterality: N/A; . PERCUTANEOUS CORONARY ROTOBLATOR INTERVENTION (PCI-R) N/A 11/05/2012  Procedure: PERCUTANEOUS CORONARY ROTOBLATOR INTERVENTION (PCI-R);  Surgeon: [name], MD;  Location: [facility] CATH LAB;  Service: Cardiovascular;  Laterality: N/A; . TRACHEOSTOMY TUBE PLACEMENT N/A 02/18/2019  Procedure: Tracheostomy;  Surgeon: [name], MD;  Location: [facility];  Service: ENT;  Laterality: N/A; . UMBILICAL HERNIA REPAIR N/A 06/02/2015  Procedure: UMBILICAL HERNIORRHAPHY WITH MESH;  Surgeon: [name], MD;  Location: [facility];  Service: General;  Laterality: N/A; There were no vitals filed for this visit.  Subjective Assessment - 07/24/19 1657   Subjective "Just water"   Special Tests MBSS   Currently in Pain? No/denies      General - 07/24/19 1657    General Information  Date of Onset 01/02/19   HPI  [name] is a 59 yo male with  supraglottic carcinoma. Laryngoscopy and biopsy on 01/02/2019, trach placed on 02/18/2019. PET scan on 01/27/2019 showed hypermetabolic laryngeal lesion with hypermetabolic cervical metastatic disease bilaterally. Weekly cisplatin and radiation started on 03/07/2019, many interruptions due to hospitalizations. Patient completed chemotherapy ( last weekly cisplatin on 05/15/2019) and radiation (03/07/2019 through 05/08/2019). Pt reportedly had a follow appointment with ENT, Dr. Benjamine Mola July 1, but no records found. He also has a history of a bilateral pontine stroke in 07/2018. He last saw Dr. Enrique Sack in June 2021 and a bony spicule in the area of #20 on the buccal aspect that is tender to palpation was identified and will ideally need aveoloplasty. He  has a PEG and only drinks water by mouth at this time. He was referred by Dr. Eppie Gibson for MBSS.  Type of Study MBS-Modified Barium Swallow Study   Diet Prior to this Study NPO;PEG tube   Temperature Spikes Noted No   Respiratory Status Room air   History of Recent Intubation No   Behavior/Cognition Alert;Cooperative;Pleasant mood   Oral Cavity Assessment Within Functional Limits   Oral Cavity -  Dentition Missing dentition;Poor condition   he reportedly saw the oral surgeon in [place], [place] yesterday with plans for extractions  Vision Functional for self feeding   Self-Feeding Abilities Able to feed self   Patient Positioning Upright in chair   Baseline Vocal Quality Normal   Volitional Cough Strong   Volitional Swallow Able to elicit   Anatomy Within functional limits   Pharyngeal Secretions Not observed secondary MBS      Oral Preparation/Oral Phase - 07/24/19 1658    Oral Preparation/Oral Phase  Oral Phase Impaired    Oral - Nectar  Oral - Nectar Teaspoon Decreased bolus cohesion    Oral - Thin  Oral - Thin Teaspoon Decreased bolus cohesion   Oral - Thin Cup Decreased bolus cohesion    Oral - Solids  Oral - Puree Decreased bolus cohesion    Electrical stimulation - Oral Phase  Was Electrical Stimulation Used No      Pharyngeal Phase - 07/24/19 1701    Pharyngeal Phase  Pharyngeal Phase Impaired    Pharyngeal - Nectar  Pharyngeal- Nectar Teaspoon Swallow initiation at vallecula;Reduced pharyngeal peristalsis;Reduced airway/laryngeal closure;Penetration/Apiration after swallow;Moderate aspiration;Pharyngeal residue - cp segment   Pharyngeal Material enters airway, passes BELOW cords without attempt by patient to eject out (silent aspiration);Material enters airway, passes BELOW cords and not ejected out despite cough attempt by patient    Pharyngeal - Thin  Pharyngeal- Thin Teaspoon Swallow initiation at vallecula;Pharyngeal residue - cp segment   Pharyngeal- Thin Cup Swallow initiation at vallecula;Swallow  initiation at pyriform sinus;Reduced pharyngeal peristalsis;Penetration/Aspiration during swallow;Penetration/Apiration after swallow;Trace aspiration;Moderate aspiration;Pharyngeal residue - cp segment   Pharyngeal Material enters airway, passes BELOW cords and not ejected out despite cough attempt by patient;Material enters airway, passes BELOW cords without attempt by patient to eject out (silent aspiration)    Pharyngeal - Solids  Pharyngeal- Puree Swallow initiation at vallecula;Reduced pharyngeal peristalsis;Reduced epiglottic inversion;Reduced tongue base retraction;Pharyngeal residue - valleculae;Pharyngeal residue - pyriform;Penetration/Apiration after swallow   Pharyngeal Material enters airway, passes BELOW cords and not ejected out despite cough attempt by patient;Material enters airway, passes BELOW cords without attempt by patient to eject out (silent aspiration)    Electrical Stimulation - Pharyngeal Phase  Was Electrical Stimulation Used No      Cricopharyngeal Phase - 07/24/19 1704    Cervical Esophageal Phase  Cervical Esophageal Phase --   narrowing near C4-5     SLP Short Term Goals - 07/24/19 1707    SLP SHORT TERM GOAL #1  Title Pt will demonstrate safe and efficient consumption of self regulated regular textures with thin liquids with use of strategies as needed.   Baseline Currently consuming a variety of regular textures and all liquids   Time 3   Period Months   Status On-going   Target Date 08/28/19    SLP SHORT TERM GOAL #2  Title Pt will complete pharyngeal swallowing exercises as assigned with use of written cue after initial introduction/model from SLP   Baseline Introduced this date   Time 3   Period Months   Status On-going   Target Date 08/28/19    SLP SHORT TERM GOAL #3  Title Pt will verbalize 3 signs/symptoms of aspiration pneumonia with min assist.   Baseline Introduced this date   Time 3   Period Months   Status On-going   Target Date 08/28/19    SLP SHORT TERM GOAL #4  Title  Complete MBSS within the next two weeks   Baseline has not  had objective swallow assessment   Time 2   Period Weeks   Status Achieved   Target Date 07/17/19      Plan - 07/24/19 1706   Clinical Impression Statement Pt presents with severe pharyngeal phase dysphagia s/p radiation therapy for supraglottic cancer. Pt demonstrated seemingly normal pharyngeal swallow response with initial tsp presentations of thin barium (vallecular trigger) on two trials, however ensuing trials via cup sip thin, tsp NTL, puree, and back to tsp thin yielded penetration during and aspiration after the swallow variably sensed (cough) and unsensed. Pt with blunted/bulbous epiglottis from post radiation effects, reduced epiglottic deflection with heavier bolus (puree), reduced pharyngeal constriction, reduced laryngeal closure, narrowing near PE segment resulting in stasis of bolus near PES and pyfiroms and aspiration after the swallow from residuals spilling into larynx across consistencies. Question whether the narrowing in pharynx near C4-5 is also due to radiation changes (may benefit from dilation?). Recommend primary source of nutrition via PEG due to high risk for aspiration and continued dysphagia therapy with a focus on Pt taking single tsp presentations of thin water after oral care. Pt should try to wear his PMSV during all waking hours. SLP will try to get records from his most recent ENT appointment.   Speech Therapy Frequency 1x /week   1-2x per month after chemoradiation  Duration 4 weeks   3 months  Treatment/Interventions Aspiration precaution training;SLP instruction and feedback;Pharyngeal strengthening exercises;Compensatory strategies;Patient/family education;Compensatory techniques;Diet toleration management by SLP   MBSS if indicated  Potential to Achieve Goals Good   SLP Home Exercise Plan Pt will complete HEP as assigned to facilitate carryover of treatment strategies and techniques in home environment with written  cues   Consulted and Agree with Plan of Care Patient     Patient will benefit from skilled therapeutic intervention in order to improve the following deficits and impairments:  Dysphagia, oropharyngeal phase  Recommendations/Treatment - 07/24/19 1704    Swallow Evaluation Recommendations  Recommended Consults Consider ENT evaluation   SLP Diet Recommendations NPO   Mare Ferrari free water protocol  Liquid Administration via Spoon   Medication Administration Via alternative means   Supervision Patient able to self feed   Compensations Small sips/bites;Multiple dry swallows after each bite/sip;Hard cough after swallow   Postural Changes Seated upright at 90 degrees;Remain upright for at least 30 minutes after feeds/meals      Prognosis - 07/24/19 1705    Prognosis  Prognosis for Safe Diet Advancement Guarded   Barriers to Reach Goals Severity of deficits    Individuals Consulted  Consulted and Agree with Results and Recommendations Patient   Report Sent to  Referring physician     Problem List Patient Active Problem List  Diagnosis Date Noted . HCAP (healthcare-associated pneumonia) 04/10/2019 . Febrile illness, acute 04/08/2019 . Malnutrition of moderate degree 04/03/2019 . GI bleed 04/01/2019 . Dehydration 03/28/2019 . RLL pneumonia 03/17/2019 . Hx of tracheostomy 02/18/2019 . Head and neck cancer ([facility]) 02/18/2019 . Malignant neoplasm of supraglottis ([facility]) 01/22/2019 . Stroke ([employer]) 07/31/2018 . Rotator cuff syndrome of right shoulder 02/20/2013 . Bradycardia 11/06/2012 . Coronary artery disease  . Abnormal finding on cardiovascular stress test 09/25/2012 . Hypertension 09/03/2012 . Chest pain 09/03/2012 . Alcohol abuse, daily use 09/03/2012 . Hyperlipidemia 09/03/2012 . Hyponatremia 09/03/2012 Thank you, Genene Churn, [place] Genene Churn 07/24/2019, 5:08 PM [facility] [address] [place], [place], [address] Phone: [phone]   Fax:  [phone] Name: ORI TREJOS MRN: [id number]  Date of Birth: 06-17-1960 CLINICAL DATA:  Dysphagia. Laryngeal cancer post radiation therapy, tracheostomy, PEG tube placement, significant neck swelling EXAM: MODIFIED BARIUM SWALLOW TECHNIQUE: Different consistencies of barium were administered orally to the patient by the Speech Pathologist. Imaging of the pharynx was performed in the lateral projection. The radiologist was present in the fluoroscopy room for this study, providing personal supervision. FLUOROSCOPY TIME:  Fluoroscopy Time:  4 minutes 12 seconds Radiation Exposure Index (if provided by the fluoroscopic device): 22.6 mGy Number of Acquired Spot Images: Multiple fluoroscopic series were saved COMPARISON:  02/21/2019 FINDINGS: With thin barium by teaspoon and cup, week hypopharyngeal muscles are seen with laryngeal penetration and aspiration. Aspiration particularly is identified from piriform sinus residuals. Laryngeal penetration also occurred with nectar consistency with piriform sinus residuals. Delayed initiation of swallow was identified with applesauce consistency with vallecular and piriform sinus residuals. Laryngeal penetration aspiration of the applesauce consistency occurred from the residuals. A swallow of thin barium to help past the applesauce also demonstrated laryngeal penetration aspiration. Patient was able to clear a portion of the aspirated contrast by coughing. IMPRESSION: Significant swallowing dysfunction as above. Please refer to the Speech Pathologists report for complete details and recommendations. Electronically Signed   By: Lavonia Dana M.D.   On: 07/24/2019 12:22   NM PET Image Restag (PS) Skull Base To Thigh  Result Date: 08/18/2019 CLINICAL DATA:  Subsequent treatment strategy for head and neck cancer. Most recent chemotherapy 2 weeks prior to imaging. EXAM: NUCLEAR MEDICINE PET SKULL BASE TO THIGH TECHNIQUE: 7.4 mCi F-18 FDG was injected intravenously. Full-ring PET imaging was performed from  the skull base to thigh after the radiotracer. CT data was obtained and used for attenuation correction and anatomic localization. Fasting blood glucose: 113 mg/dl COMPARISON:  Multiple exams, including CT scan 08/11/2019 and PET-CT from 01/27/2019 FINDINGS: Mediastinal blood pool activity: SUV max 2.5 Liver activity: SUV max N/A NECK: Marked improvement in the glottic and left supraglottic mass, current local maximum SUV 5.7 and previously 21.9. Ill-defined ariepiglottic folds. Previously hypermetabolic left level II lymph node measures 0.5 cm in short axis on image 32/4 (formerly 1.1 cm) with maximum SUV 1.9 (formerly 13.7). Previously hypermetabolic right level III lymph node measures 0.5 cm in short axis on image 47/4 (formerly 1.0 cm) with maximum SUV 1.8 (formerly 18.4). No currently hypermetabolic or pathologically enlarged lymph nodes in the neck are identified. Incidental CT findings: Subcutaneous edema is present along the lower face/chin and along the anterior neck above the level of the tracheostomy tube. Low-grade activity of this infiltrative process, likely reflecting inflammation. Atherosclerotic calcification of the common carotid arteries bilaterally. Physiologic activity at the tracheostomy site. CHEST: A cluster of 2 adjacent AP window lymph nodes measures 0.8 cm in short axis on image 74/4 (formerly the same) with maximum SUV 3.4 (formerly 2.9), probably incidental but meriting surveillance. Incidental CT findings: Coronary, aortic arch, and branch vessel atherosclerotic vascular disease. Trace pericardial fluid. Fatty atrophy of the right subscapularis muscle. Centrilobular emphysema. New minimal subpleural nodularity in the right upper lobe including a 0.3 cm subpleural nodule on image 24/8, likely incidental, but meriting surveillance. Faint subpleural reticulonodular opacity in the right middle lobe is likewise new. Mild dependent atelectasis in the lower lobes. Two vague areas of  ground-glass opacity present in the left upper lobe, 1 of these measuring 2.0 by 1.1 cm on image 32/8 the adjacent measuring 1.1 by 0.8 cm on image 33/8, without accentuated metabolic activity. This likely represent areas of low-grade  alveolitis but merit surveillance. ABDOMEN/PELVIS: Physiologic activity at the PEG tube site. Otherwise unremarkable. Incidental CT findings: Aortoiliac atherosclerotic vascular disease. SKELETON: Activity along the spinous processes at L3 and L4 associated with spurring, felt to be benign. Overall no significant abnormal bony activity is identified. Mild degenerative activity at the [facility] joints. Incidental CT findings: Degenerative disc disease at L5-S1. IMPRESSION: 1. 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 Previously hypermetabolic and enlarged cervical lymph nodes are now of normal size and no longer hypermetabolic. 2. Probable minimal alveolitis in the left upper lobe, with some new minimal subpleural nodularity in the right lung which is probably inflammatory/infectious. Surveillance suggested. 3. Stranding in the soft tissues above the tracheostomy site, probably related to prior radiation therapy or inflammation. 4. Other imaging findings of potential clinical significance: Aortic Atherosclerosis (ICD10-I70.0). Coronary atherosclerosis. Emphysema (ICD10-J43.9). Electronically Signed   By: Van Clines M.D.   On: 08/18/2019 14:28   IR REPLACE G-TUBE SIMPLE WO FLUORO  Result Date: 08/12/2019 INDICATION: Throat carcinoma. Inadvertent displacement of gastrostomy catheter 2 days ago EXAM: GASTROSTOMY PLACEMENT UNDER FLUOROSCOPY MEDICATIONS: As antibiotic prophylaxis, cefazolin 2 g was ordered pre-procedure and administered intravenously within one hour of incision. ANESTHESIA/SEDATION: Intravenous Fentanyl 65mg and Versed 14mwere administered as conscious sedation during  continuous monitoring of the patient's level of consciousness and physiological / cardiorespiratory status by the radiology RN, with a total moderate sedation time of 10 minutes. CONTRAST:  10 mL Omnipaque 300-administered into the gastric lumen. PROCEDURE: Informed written consent was obtained from the patient after a thorough discussion of the procedural risks, benefits and alternatives. All questions were addressed. Maximal Sterile Barrier Technique was utilized including caps, mask, sterile gowns, sterile gloves, sterile drape, hand hygiene and skin antiseptic. A timeout was performed prior to the initiation of the procedure. Viscous lidocaine was liver lead placed at the skin entry site of previous catheter. Under intermittent fluoroscopy, an angled Kumpe catheter was advanced using small amount of contrast to opacify the tract until access to the gastric lumen was achieved. Contrast injection confirmed intraluminal placement. Catheter exchanged over Amplatz wire for vascular dilators which allowed advancement of 8 an 1871rench balloon retention single-lumen gastrostomy tube. The retention balloon was inflated in the gastric lumen with 8 mL sterile saline. External bumper applied. Injection confirmed appropriate positioning. The patient tolerated the procedure well. FLUOROSCOPY TIME:  30 seconds; 5 mGy COMPLICATIONS: None immediate. IMPRESSION: 1. Technically successful 1847rench balloon retention gastrostomy placement through the previous tract. Okay for immediate use. Electronically Signed   By: D Lucrezia Europe.D.   On: 08/12/2019 15:30   IR Replc Gastro/Colonic Tube Percut W/Fluoro  Result Date: 08/13/2019 INDICATION: Throat carcinoma, needs enteral feeding support. Previously placed gastrostomy tube was inadvertently removed. EXAM: GASTROSTOMY CATHETER REPLACEMENT MEDICATIONS: As antibiotic prophylaxis, cefazolin 2 g was ordered pre-procedure and administered intravenously within one hour of incision.  ANESTHESIA/SEDATION: Intravenous Fentanyl 503mand Versed 1mg9mre administered as conscious sedation during continuous monitoring of the patient's level of consciousness and physiological / cardiorespiratory status by the radiology RN, with a total moderate sedation time of 10 minutes. CONTRAST:  10mL41mIPAQUE IOHEXOL 300 MG/ML SOLN - administered into the gastric lumen. FLUOROSCOPY TIME:  30 seconds; 5 mGy COMPLICATIONS: None immediate. PROCEDURE: Informed written consent was obtained from the patient after a thorough discussion of the procedural risks, benefits and alternatives. All questions were addressed. Maximal Sterile Barrier Technique was utilized  including caps, mask, sterile gowns, sterile gloves, sterile drape, hand hygiene and skin antiseptic. A timeout was performed prior to the initiation of the procedure. Viscous lidocaine was applied to the skin site of the prior gastrostomy catheter. Under fluoroscopy, an angled Kumpe catheter was advanced through the tract into the gastric lumen using small amount of contrast. Catheter exchanged over short Amplatz wire for serial vascular dilators which facilitated placement of an 79 French balloon retention gastrostomy catheter. Balloon was inflated with 8 mL sterile saline in the gastric lumen. Contrast injection confirms good positioning into the decompressed stomach. The external bumper was applied and the catheter was flushed. IMPRESSION: 1. Technically successful gastrostomy tube placement through previous tract. Okay for routine use. Electronically Signed   By: Lucrezia Europe M.D.   On: 08/13/2019 12:18   DG ABDOMEN PEG TUBE LOCATION  Result Date: 08/08/2019 CLINICAL DATA:  Peg tube position. EXAM: ABDOMEN - 1 VIEW COMPARISON:  CT abdomen 02/19/2019 FINDINGS: Peg tube injected with 30 mL Omnipaque 300. Peg tube tip is in the gastric antrum. The balloon is inflated in the antrum. There is contrast in the stomach and duodenum. No extravasation. IMPRESSION:  Peg tube in the gastric antrum. Electronically Signed   By: Franchot Gallo M.D.   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  This is consistent with complete response however there is some mild SUV uptake that is likely related to posttreatment inflammation.  To confirm that he has a complete response the patient should undergo laryngoscopy at his appointment in September with Dr. Benjamine Mola and also an esophagoscopy as he not only had laryngeal involvement but also upper esophageal involvement according to his initial PET scan.  Referral will also be made to gastroenterology for esophagoscopy.  If the patient has complete response in his larynx, defer to ENT to determine when trach can be removed.  2) Nutritional Status: Stable PEG tube: Using  3) Risk Factors: The patient has been educated about risk factors including alcohol and tobacco abuse; they understand that avoidance of alcohol and tobacco is important to prevent recurrences as well as other cancers  4) Swallowing: Continue speech-language pathology exercises  5) Dental: Status post full dental extractions  6) Thyroid function: No results found for: TSH (check annually to screen for hypothyroidism -we can check this in radiation oncology at next appointment if medical oncology does not take care of it)  7) patient has significant lymphedema in his neck.  Referral to physical therapy for lymphedema management and rehabilitation.  8) Follow-up in in approximately 6 months with CT scan of chest.  We will arrange medical oncology appointment in 3 months.  Otolaryngology sees him next month.  Gastroenterology referral as above.  He knows to call if he has any issues in the interim.  We discussed measures to reduce the risk of infection during the COVID-19 pandemic.  I explained the significant risks of Covid if either of them become infected by it. I  again spoke in depth with the patient and his wife about the vaccine.  I answered their questions and concerns about the vaccine.  We have spoken about all of these things multiple times; I again strongly recommended that they receive the vaccine.  They do not feel ready to do so and they declined the vaccine at this time. They know how to schedule one when they make the decision.  On date of service, in total, I spent 30 minutes on this encounter.  This includes counseling regarding the Covid vaccine, physical exam, discussing symptoms and follow-up plans, working with our patient navigator to facilitate follow-up imaging and appointments/referrals. The patient was seen in person.    _____________________________________   Eppie Gibson, MD

## 2019-08-21 ENCOUNTER — Telehealth (HOSPITAL_COMMUNITY): Payer: Self-pay | Admitting: Speech Pathology

## 2019-08-21 NOTE — Telephone Encounter (Signed)
S/w wife and scheduled in clinic ST f/u with [facility] for 09/02/2019

## 2019-08-22 ENCOUNTER — Encounter: Payer: Self-pay | Admitting: Radiation Oncology

## 2019-08-26 ENCOUNTER — Other Ambulatory Visit (HOSPITAL_COMMUNITY): Payer: Self-pay | Admitting: Interventional Radiology

## 2019-08-26 DIAGNOSIS — I639 Cerebral infarction, unspecified: Secondary | ICD-10-CM

## 2019-09-02 ENCOUNTER — Ambulatory Visit (HOSPITAL_COMMUNITY): Payer: 59 | Admitting: Speech Pathology

## 2019-09-02 ENCOUNTER — Other Ambulatory Visit: Payer: Self-pay

## 2019-09-02 ENCOUNTER — Telehealth (HOSPITAL_COMMUNITY): Payer: Self-pay | Admitting: Speech Pathology

## 2019-09-02 ENCOUNTER — Ambulatory Visit (HOSPITAL_COMMUNITY)
Admission: RE | Admit: 2019-09-02 | Discharge: 2019-09-02 | Disposition: A | Discharge status: Home / Self Care | Payer: 59 | Source: Ambulatory Visit | Attending: Interventional Radiology | Admitting: Interventional Radiology

## 2019-09-02 DIAGNOSIS — I639 Cerebral infarction, unspecified: Secondary | ICD-10-CM | POA: Diagnosis not present

## 2019-09-02 NOTE — Progress Notes (Signed)
Carotid artery duplex completed. Refer to "CV Proc" under chart review to view preliminary results.  09/02/2019 11:10 AM Kelby Aline., MHA, RVT, RDCS, RDMS

## 2019-09-02 NOTE — Telephone Encounter (Signed)
pt cancelled appt because he said that he is wore out from another appt in [place]

## 2019-09-03 ENCOUNTER — Other Ambulatory Visit: Payer: Self-pay | Admitting: *Deleted

## 2019-09-03 ENCOUNTER — Telehealth (HOSPITAL_COMMUNITY): Payer: Self-pay

## 2019-09-03 ENCOUNTER — Encounter: Payer: Self-pay | Admitting: *Deleted

## 2019-09-03 NOTE — Telephone Encounter (Signed)
Pt's wife agreed to f/u in 6 months with US carotid. AW

## 2019-09-03 NOTE — Patient Outreach (Signed)
[facility]) Care Management  09/03/2019  [name] [birth year] [id number]  Telephone outreach.  Talked with Mrs. Cordoba today who informs me Mr. Tally had his trach removed and has not had any complications. Breathing well. Will get ST eval for consideration of starting POs. Has many appts this month for multiple aspects of his complex case.  Mrs. Teschner says he is feeling optimistic, looking forward to being able to eat and drink again.  She reports she is unsure if they got the letter and forms that were requested to be sent 2 weeks ago. They have a PO Box. Will ask that this info be sent again.  Reinforced NP availability for questions and trouble shooting. Will call again in 1 month.  Eulah Pont. Myrtie Neither, MSN, [facility] Gerontological Nurse Practitioner [facility] Care Management [phone]

## 2019-09-05 ENCOUNTER — Ambulatory Visit (HOSPITAL_COMMUNITY)
Admission: RE | Admit: 2019-09-05 | Discharge: 2019-09-05 | Disposition: A | Discharge status: Home / Self Care | Payer: 59 | Source: Ambulatory Visit | Attending: Otolaryngology | Admitting: Otolaryngology

## 2019-09-05 ENCOUNTER — Other Ambulatory Visit: Payer: Self-pay | Admitting: *Deleted

## 2019-09-05 ENCOUNTER — Other Ambulatory Visit: Payer: Self-pay

## 2019-09-05 DIAGNOSIS — R221 Localized swelling, mass and lump, neck: Secondary | ICD-10-CM | POA: Diagnosis not present

## 2019-09-05 MED ORDER — IOHEXOL 300 MG/ML  SOLN
75.0000 mL | Freq: Once | INTRAMUSCULAR | Status: AC | PRN
  Administered 2019-09-05: 75 mL via INTRAVENOUS

## 2019-09-05 NOTE — Patient Outreach (Signed)
[facility]) Care Management  09/05/2019  [name] 1960/02/07 [id number]  Incoming call from Mrs. Lurz.  Mrs. Minton calls concerned for [name] somnolence, lack of interest in the things he used to do or involvement with family on his birthday last week. He has become apathetic and somewhat forgetful. She also thinks that he may not be taking his feedings while she is away because he sleeps all the time, although he will tell her he has. She has recently not found the boxes of the fornula in the trash like she would anticipate if he had fed himself.  Counseled Mrs. Sattar on the likelihood that [name] is depressed which would not be unusual in his circumstances (Dx and treatment of throat cancer). Advised NP to notify primary care and oncology to assess and address.  Eulah Pont. Myrtie Neither, MSN, [facility] Gerontological Nurse Practitioner [facility] Care Management [phone]

## 2019-09-11 ENCOUNTER — Encounter (HOSPITAL_COMMUNITY): Payer: Self-pay | Admitting: Speech Pathology

## 2019-09-11 ENCOUNTER — Telehealth (HOSPITAL_COMMUNITY): Payer: Self-pay | Admitting: Speech Pathology

## 2019-09-11 ENCOUNTER — Ambulatory Visit (HOSPITAL_COMMUNITY): Payer: 59 | Admitting: Speech Pathology

## 2019-09-11 NOTE — Telephone Encounter (Signed)
Telephone Call  Pt did not show up for his 11:30 AM SLP appointment. I spoke with his spouse who indicated they forgot the appointment and so he was then rescheduled for next Tuesday afternoon.  Thank you,  Genene Churn, CCC-SLP [phone]

## 2019-09-12 ENCOUNTER — Encounter: Payer: Self-pay | Admitting: Physician Assistant

## 2019-09-12 NOTE — Progress Notes (Signed)
Cardiology Office Note    Date:  09/15/2019   ID:  [name], DOB 14-Mar-1960, MRN [id number]  PCP:  [name], MD  Cardiologist:  Kate Sable, MD (Inactive)  Electrophysiologist:  None   Chief Complaint: f/u CAD  History of Present Illness:   [name] is a 59 y.o. male with history of CAD (s/p rotational atherectomy and DES placement to proximal LAD in 11/2012), carotid artery stenosis (followed by neurology), dilation of aortic root, HTN, HL, CVA, throat cancer s/p tracheostomy s/p removal, dysphagia with PEG tube placement, chronic back pain who presents to the office today for follow-up.  Cardiac history is outlined above. In 08/2018 he was admitted with an acute CVA due to infarction of the pons. Neurology recommended to be on ASA and Plavix for 3 months then Plavix alone. Cerebral angiogram during admission did show bilateral vertebral artery occlusions with proximal RICA 65% stenosis and outpatient follow-up was recommended. 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  He was admitted for syncope and hypotension 04/2019. He had elevated troponins felt due to demand ischemia in the context of acute anemia and possible UGIB. EGD was declined. Plavix was discontinued and he was continued on aspirin. 2D Echo 04/03/19 showed normal EF, mild LVH, moderate-severely dilated aortic root, trace AI. CT/MR suggested for f/u of the aortic dilation but do not see performed for this reason. He was readmitted soon after with sepsis and bacteremia. More recently he was admitted 08/2019 for PEG tube malfunction s/p replacement. Neurology has followed his carotid disease, with carotid duplex 09/02/19 showing 1-39% stenosis bilaterally although more accurate velocities on the right were obscured due to calcific plaque.  He presents back for routine follow-up doing well. He denies any CP. He reports having his trach taken out recently and is in  good spirits. He denies any CP, SOB, edema. He reports he got good news that his most recent neck scan looked good.  Labwork independently reviewed: 08/2019 K 3.8, Cr 0.94, Hgb 12.1 05/2019 LFTs wnl, Mg 1.8 12/2018 LDL 74  Past Medical History:  Diagnosis Date  . Anxiety   . Arthritis    back  . Bradycardia    a. During 11/2012 adm: lopressor decreased.  . Cancer of larynx ([place])   . Carotid artery stenosis   . 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 Coronary artery disease    a. Diag cath 10/2012 for CP/abnormal nuc -> planned PCI s/p LAD atherectomy/DES placement 11/05/12.  . Dilated aortic root ([place])   . GERD (gastroesophageal reflux disease)   . History of hiatal hernia   . Hypercholesteremia   . Hypertension   . PEG tube malfunction ([place])   . Stroke ([place]) 07/31/2018   bilateral vertebrobasilar occlusion; "left side weaker thanit was before."    Past Surgical History:  Procedure Laterality Date  . CARDIAC CATHETERIZATION  10/29/2012  . CORONARY ANGIOPLASTY WITH STENT PLACEMENT  11/05/2012  . DIRECT LARYNGOSCOPY N/A 01/02/2019   Procedure: MICRO DIRECT LARYNGOSCOPY BIOPSY OF LARYNGEAL MASS;  Surgeon: [name], MD;  Location: [facility];  Service: ENT;  Laterality: N/A;  . ESOPHAGOGASTRODUODENOSCOPY (EGD) WITH PROPOFOL N/A 08/11/2019   Procedure: ATTEMPTED ESOPHAGOGASTRODUODENOSCOPY (EGD) WITH PROPOFOL (UNABLE TO PERFORM PERCUTANEOUS ENDOSCOPIC GASTROSTOMY TUBE REPLACEMENT);  Surgeon: [name], MD;  Location: [facility];  Service: Gastroenterology;  Laterality:  N/A;  . HEMORRHOID SURGERY  ~ 2010  . INSERTION OF MESH N/A 06/02/2015   Procedure: INSERTION OF MESH;  Surgeon: [name], MD;  Location: [facility];  Service: General;  Laterality: N/A;  . IR ANGIO INTRA EXTRACRAN SEL COM CAROTID INNOMINATE BILAT MOD SED  08/02/2018  . IR ANGIO VERTEBRAL SEL VERTEBRAL BILAT MOD SED  08/02/2018  . IR REPLACE G-TUBE  SIMPLE WO FLUORO  08/12/2019  . IR REPLC GASTRO/COLONIC TUBE PERCUT W/FLUORO  08/13/2019  . LAPAROSCOPIC INSERTION GASTROSTOMY TUBE N/A 02/24/2019   Procedure: LAPAROSCOPIC INSERTION GASTROSTOMY TUBE;  Surgeon: [name], MD;  Location: [facility];  Service: General;  Laterality: N/A;  . LEFT HEART CATHETERIZATION WITH CORONARY ANGIOGRAM N/A 10/30/2012   Procedure: LEFT HEART CATHETERIZATION WITH CORONARY ANGIOGRAM;  Surgeon: [name], MD;  Location: [facility] CATH LAB;  Service: Cardiovascular;  Laterality: N/A;  . MULTIPLE EXTRACTIONS WITH ALVEOLOPLASTY N/A 02/18/2019   Procedure: Extraction of tooth #'s 5-7, 10,11,14,20-29, 31 and 32 with alveoloplasty and maxiillary left buccal exostoses reductions;  Surgeon: [name], [name];  Location: [facility];  Service: Oral Surgery;  Laterality: N/A;  . PERCUTANEOUS CORONARY ROTOBLATOR INTERVENTION (PCI-R) N/A 11/05/2012   Procedure: PERCUTANEOUS CORONARY ROTOBLATOR INTERVENTION (PCI-R);  Surgeon: [name], MD;  Location: [facility] CATH LAB;  Service: Cardiovascular;  Laterality: N/A;  . TRACHEOSTOMY TUBE PLACEMENT N/A 02/18/2019   Procedure: Tracheostomy;  Surgeon: [name], MD;  Location: [facility];  Service: ENT;  Laterality: N/A;  . UMBILICAL HERNIA REPAIR N/A 06/02/2015   Procedure: UMBILICAL HERNIORRHAPHY WITH MESH;  Surgeon: [name], MD;  Location: [facility];  Service: General;  Laterality: N/A;    Current Medications: Current Meds  Medication Sig  . acetaminophen (TYLENOL) 325 MG tablet Take 2 tablets (650 mg total) by mouth every 6 (six) hours as needed for mild pain, fever or headache (or Fever >/= 101).  . Amino Acids-Protein Hydrolys (FEEDING SUPPLEMENT, PRO-STAT SUGAR FREE 64,) LIQD Place 30 mLs into feeding tube 3 (three) times daily with meals.  Marland Kitchen aspirin EC 81 MG tablet Take 81 mg by mouth daily.   Marland Kitchen atorvastatin (LIPITOR) 80 MG tablet Take 1 tablet (80 mg total) by mouth every evening. (Patient taking differently: Place 40 mg into feeding  tube in the morning and at bedtime. )  . Catheters (ARGYLE SUCTION CATHETER 14FR) MISC 1 Device by Does not apply route daily.  . diazepam (VALIUM) 10 MG tablet Place 10 mg into feeding tube 3 (three) times daily as needed.   . diphenoxylate-atropine (LOMOTIL) 2.5-0.025 MG tablet Place 2 tablets into feeding tube 4 (four) times daily as needed for diarrhea or loose stools.   . famotidine (PEPCID) 20 MG tablet Place 20 mg into feeding tube daily.   Marland Kitchen HYDROcodone-acetaminophen (NORCO) 10-325 MG tablet Take 1 tablet by mouth every 4 (four) hours as needed.  . lidocaine (XYLOCAINE) 2 % solution Patient: Mix 1part 2% viscous lidocaine, 1part H20. Swish & swallow 51m of diluted mixture, 355m before meals and at bedtime, up to QID  . nitroGLYCERIN (NITROSTAT) 0.4 MG SL tablet Place 1 tablet (0.4 mg total) under the tongue every 5 (five) minutes as needed for chest pain (CP or SOB).  . Nutritional Supplements (FEEDING SUPPLEMENT, OSMOLITE 1.5 CAL,) LIQD Give 2 cartons of osmolite 1.5 at 9:30am, 1:30, 5:30 and 1 carton at 9:30pm.  Flush with 6065mf water before and 120m61mter each feeding. Meets 100% of patient needs. Send bolus supplies. (Patient taking differently: Place  1,000 mLs into feeding tube in the morning, at noon, in the evening, and at bedtime. Give 2 cartons of osmolite 1.5 at 9:30am, 1:30, 5:30 and 1 carton at 9:30pm.  Flush with 7m of water before and 1228mafter each feeding. Meets 100% of patient needs. Send bolus supplies.)  . Nutritional Supplements (PROSOURCE TF) LIQD Give 3049m times per day via feeding tube.  Mix with 43m47m water.  Flush tube with 43ml74mwater, then place prosource and water mixture in tube and flush with 43ml 5mater after. (Patient taking differently: Place 30 mLs into feeding tube in the morning and at bedtime. Mix with 43ml o38mter.  Flush tube with 43ml of26mer, then place prosource and water mixture in tube and flush with 43ml of 65mr after.)  . omeprazole  (PRILOSEC) 20 MG capsule Take 20 mg by mouth daily.   . prochlorperazine (COMPAZINE) 10 MG tablet TAKE 1 TABLET(10 MG) BY MOUTH EVERY 6 HOURS AS NEEDED FOR NAUSEA  . Tracheostomy Care KIT 1 kit by Does not apply route daily.  . traMADol (ULTRAM) 50 MG tablet Take 50-100 mg by mouth 4 (four) times daily as needed (pain.).   . Water FMarland Kitchenr Irrigation, Sterile (FREE WATER) SOLN Place 100 mLs into feeding tube See admin instructions. Please Flush PEG tube with 1 ounce (30 ml) before giving to feed, and then flush with 3 ounces (90ml) aft52miving to feed--- up to 4 times a day      Allergies:   Duloxetine and Prednisone   Social History   Socioeconomic History  . Marital status: Married    Spouse name: [name]  . [name] of children: 4  . Years of education: Not on file  . Highest education level: Not on file  Occupational History  . Occupation: Disabled    Comment: previously worked in constructiNurse, learning disabilityulationInvestment banker, corporateg status: Former Smoker    Packs/day: 3.00    Years: 32.00    Pack years: 96.00    Types: Cigarettes    Start date: 08/29/1977    Quit date: 11/29/2006    Years since quitting: 12.8  . Smokeless tobacco: Former User    TySystems developer Chew  . Tobacco comment: 11/05/2012 "chewed tobacco when I play ball; aien't chewed since age 32"  Vapin68Use  . Vaping Use: Never used  Substance and Sexual Activity  . Alcohol use: Not Currently    Alcohol/week: 12.0 standard drinks    Types: 12 Cans of beer per week    Comment: None since 02/2019   . Drug use: Not Currently    Types: Marijuana    Comment: denies on 04/08/19  . Sexual activity: Not Currently  Other Topics Concern  . Not on file  Social History Narrative   Patient is disabled.  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  Patient previously 3 pack/day use.   Patient currently drinking 3-4 beers per day  from 12 pack a day.   Patient denies use of chew or other illicit drugs.   Social Determinants of Health   Financial Resource Strain: Low Risk   . Difficulty of Paying Living Expenses: Not hard at all  Food Insecurity: No Food Insecurity  . Worried About Running OuCharity fundraiserst Year: Never true  . Ran Out of Food in the Last  Year: Never true  Transportation Needs: No Transportation Needs  . Lack of Transportation (Medical): No  . Lack of Transportation (Non-Medical): No  Physical Activity: Insufficiently Active  . Days of Exercise per Week: 2 days  . Minutes of Exercise per Session: 30 min  Stress: Stress Concern Present  . Feeling of Stress : To some extent  Social Connections: Socially Isolated  . Frequency of Communication with Friends and Family: Once a week  . Frequency of Social Gatherings with Friends and Family: Once a week  . Attends Religious Services: Never  . Active Member of Clubs or Organizations: No  . Attends Archivist Meetings: Never  . Marital Status: Married     Family History:  The patient's family history includes Heart disease in his father and sister; Hypertension in his father, maternal grandfather, maternal grandmother, mother, paternal grandfather, and paternal grandmother.  ROS:   Please see the history of present illness.  All other systems are reviewed and otherwise negative.    EKGs/Labs/Other Studies Reviewed:    Studies reviewed are outlined and summarized above. Reports included below if pertinent.  2D Echo 04/03/19 IMPRESSIONS  1. Normal LV systolic function; grade 1 diastolic dysfunction; mild LVH;  moderately to severely dilated aortic root; suggest CTA or MRA to further  assess; trace AI.  2. Left ventricular ejection fraction, by estimation, is 55 to 60%. The  left ventricle has normal function. The left ventricle has no regional  wall motion abnormalities. There is mild left ventricular hypertrophy.  Left  ventricular diastolic parameters  are consistent with Grade I diastolic dysfunction (impaired relaxation).  3. Right ventricular systolic function is normal. The right ventricular  size is normal. There is mildly elevated pulmonary artery systolic  pressure.  4. The mitral valve is normal in structure. Trivial mitral valve  regurgitation. No evidence of mitral stenosis.  5. The aortic valve is tricuspid. Aortic valve regurgitation is trivial.  No aortic stenosis is present.  6. Aortic dilatation noted. There is moderate to severe dilatation of the  aortic root measuring 48 mm.  7. The inferior vena cava is dilated in size with >50% respiratory  variability, suggesting right atrial pressure of 8 mmHg.     EKG:  EKG is not ordered today  Recent Labs: 05/29/2019: ALT 15; Magnesium 1.8 08/10/2019: BUN 14; Creatinine, Ser 0.94; Hemoglobin 12.1; Platelets 314; Potassium 3.8; Sodium 138  Recent Lipid Panel    Component Value Date/Time   CHOL 167 12/24/2018 0918   TRIG 67 12/24/2018 0918   HDL 80 12/24/2018 0918   CHOLHDL 2.1 12/24/2018 0918   CHOLHDL 2.7 08/01/2018 0351   VLDL 20 08/01/2018 0351   LDLCALC 74 12/24/2018 0918    PHYSICAL EXAM:    VS:  BP 122/82   Pulse 78   Ht 6' (1.829 m)   Wt 157 lb (71.2 kg)   SpO2 98%   BMI 21.29 kg/m   BMI: Body mass index is 21.29 kg/m.  GEN: Thin WM, in no acute distress HEENT: normocephalic, atraumatic Neck: no JVD, carotid bruits, or masses Cardiac: RRR; no murmurs, rubs, or gallops, no edema  Respiratory:  clear to auscultation bilaterally, normal work of breathing GI: soft, nontender, nondistended, + BS MS: no deformity or atrophy Skin: warm and dry, no rash Neuro:  Alert and Oriented x 3, Strength and sensation are intact, follows commands Psych: euthymic mood, full affect  Wt Readings from Last 3 Encounters:  09/15/19 157 lb (71.2 kg)  08/19/19  156 lb (70.8 kg)  08/12/19 154 lb 1.6 oz (69.9 kg)     ASSESSMENT & PLAN:    1. CAD - no recent anginal symptoms. Continue aspirin (neurology aware per 06/2019 note that he is no longer on Plavix), statin. Would be helpful to update lipids as his LDL was slightly above goal in 12/2018. We will need to get labs as outlined below so will get CMET/lipid profile when he returns fasting. 2. Carotid artery stenosis and history of stroke - followed by neurology. Continue risk factor modification.  Notes indicate neurology team plans a f/u study in 6 months.  3. Dilation of aortic root - moderate-severe by echocardiogram in 04/2019, suggesting f/u CT or MR. In talking with the patient he is not totally sure if he has any metal in his body. Will therefore pursue CTA. He will likely need a pre-CT BMET and as above we'll actually plan to get a CMET/lipid profile on a day when he returns fasting. He had lunch and Mtn Dew today. 4. Essential HTN - controlled without any specific agents on board at this time.  Disposition: F/u with Dr. Domenic Polite to establish care in 6 months then can likely go to yearly thereafter.  Medication Adjustments/Labs and Tests Ordered: Current medicines are reviewed at length with the patient today.  Concerns regarding medicines are outlined above. Medication changes, Labs and Tests ordered today are summarized above and listed in the Patient Instructions accessible in Encounters.    Signed, Charlie Pitter, PA-C  09/15/2019 4:54 PM    [facility] Location in [place]. [place], [place] [address] Ph: [phone]; Fax [phone]

## 2019-09-15 ENCOUNTER — Other Ambulatory Visit: Payer: Self-pay

## 2019-09-15 ENCOUNTER — Encounter: Payer: Self-pay | Admitting: Physician Assistant

## 2019-09-15 ENCOUNTER — Ambulatory Visit (INDEPENDENT_AMBULATORY_CARE_PROVIDER_SITE_OTHER): Payer: 59 | Admitting: Physician Assistant

## 2019-09-15 VITALS — BP 122/82 | HR 78 | Ht 72.0 in | Wt 157.0 lb

## 2019-09-15 DIAGNOSIS — I6523 Occlusion and stenosis of bilateral carotid arteries: Secondary | ICD-10-CM

## 2019-09-15 DIAGNOSIS — I77810 Thoracic aortic ectasia: Secondary | ICD-10-CM | POA: Diagnosis not present

## 2019-09-15 DIAGNOSIS — I2510 Atherosclerotic heart disease of native coronary artery without angina pectoris: Secondary | ICD-10-CM

## 2019-09-15 DIAGNOSIS — I10 Essential (primary) hypertension: Secondary | ICD-10-CM

## 2019-09-15 DIAGNOSIS — E785 Hyperlipidemia, unspecified: Secondary | ICD-10-CM

## 2019-09-15 DIAGNOSIS — Z8673 Personal history of transient ischemic attack (TIA), and cerebral infarction without residual deficits: Secondary | ICD-10-CM | POA: Diagnosis not present

## 2019-09-15 NOTE — Patient Instructions (Signed)
Medication Instructions:  Your physician recommends that you continue on your current medications as directed. Please refer to the Current Medication list given to you today.  *If you need a refill on your cardiac medications before your next appointment, please call your pharmacy*   Lab Work: Your physician recommends that you return for lab work in: Fasting   If you have labs (blood work) drawn today and your tests are completely normal, you will receive your results only by: Marland Kitchen MyChart Message (if you have MyChart) OR . A paper copy in the mail If you have any lab test that is abnormal or we need to change your treatment, we will call you to review the results.   Testing/Procedures: Non-Cardiac CT Angiography (CTA), is a special type of CT scan that uses a computer to produce multi-dimensional views of major blood vessels throughout the body. In CT angiography, a contrast material is injected through an IV to help visualize the blood vessels    Follow-Up: At [facility], you and your health needs are our priority.  As part of our continuing mission to provide you with exceptional heart care, we have created designated Provider Care Teams.  These Care Teams include your primary Cardiologist (physician) and Advanced Practice Providers (APPs -  Physician Assistants and Nurse Practitioners) who all work together to provide you with the care you need, when you need it.  We recommend signing up for the patient portal called "MyChart".  Sign up information is provided on this After Visit Summary.  MyChart is used to connect with patients for Virtual Visits (Telemedicine).  Patients are able to view lab/test results, encounter notes, upcoming appointments, etc.  Non-urgent messages can be sent to your provider as well.   To learn more about what you can do with MyChart, go to [email or web address].    Your next appointment:   6 month(s)  The format for your next appointment:   In Person   Provider:   Rozann Lesches, MD   Other Instructions Thank you for choosing [facility]!

## 2019-09-16 ENCOUNTER — Encounter (HOSPITAL_COMMUNITY): Payer: Self-pay | Admitting: Speech Pathology

## 2019-09-16 ENCOUNTER — Encounter (HOSPITAL_COMMUNITY): Payer: Self-pay | Admitting: General Practice

## 2019-09-16 ENCOUNTER — Ambulatory Visit (HOSPITAL_COMMUNITY): Payer: 59 | Attending: Radiation Oncology | Admitting: Speech Pathology

## 2019-09-16 ENCOUNTER — Telehealth: Payer: Self-pay

## 2019-09-16 DIAGNOSIS — R1312 Dysphagia, oropharyngeal phase: Secondary | ICD-10-CM | POA: Diagnosis present

## 2019-09-16 DIAGNOSIS — I890 Lymphedema, not elsewhere classified: Secondary | ICD-10-CM | POA: Diagnosis present

## 2019-09-16 DIAGNOSIS — Z9889 Other specified postprocedural states: Secondary | ICD-10-CM | POA: Diagnosis present

## 2019-09-16 DIAGNOSIS — R1319 Other dysphagia: Secondary | ICD-10-CM | POA: Insufficient documentation

## 2019-09-16 NOTE — Telephone Encounter (Signed)
Contacted patient to verify telephone visit for pre reg °

## 2019-09-16 NOTE — Addendum Note (Signed)
Addended by: Ephraim Hamburger on: 09/16/2019 05:10 PM   Modules accepted: Orders

## 2019-09-16 NOTE — Progress Notes (Signed)
[facility] CSW Progress Notes  Call to patient/wife at request of Jennet Maduro, dietitian. Wife had called Deloria Lair NP, concerned about somnolence and possible lack of nutritional intake.Wife concerned that he is not taking nutritional supplements as prescribed.  Does note that he is taking some soft foods by mouth and is able to tolerate these. Wife is observing some personality changes.  Per wife, "he talks out of his head sometimes", he has told her "I feel like Im losing my mind."  She has checked his oxygen, "yesterday after we got back from doing everything we had to do, he was like a drunk person, I couldn't understand what he was saying, slamming doors, saying hateful things."  Wife attributed it to being overheated yesterday, states "he did come back to being himself when he cooled down."  Wife worried that "there is not enough blood going to the brain."  Per wife, "he has blood clots at the base of his brain and his groin."  Per wife, "he has been anxious since [place] known him, 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  Wife reports he is significantly weak and deconditioned, "he cannot even walk between car and front door."  Patient drove for the first time this week, per wife "he said he will never drive again."  Made dangerous maneuver which was uncharacteristic for him.    Reviewed options for further assessment of these issues - per wife, patient is unlikely to accept referral to psychiatrist but is scheduled to see PCP this week.  Encouraged wife to describe symptoms to PCP and enlist his help and guidance as it is unclear etiology of multiple symptoms. Per wife, patient is difficult to understand so she does accompany him to PCP visits.  Advised that patient can request a mentor from head neck nurse navigator and can participate in "[place] [facility]."  Prior to diagnosis, patient was fully able to care for himself.  Wants to be as  independent as possible as he recovers from cancer treatment.  Will discuss w dietitian, recommend both evaluation by PCP to determine any underlying medical issues as well as encouraging any options for increased self sufficiency and sense of agency as he recovers from treatment.  Will touch base w patient/wife by phone in two weeks.    Edwyna Shell, LCSW Clinical Social Worker Phone:  [phone]

## 2019-09-16 NOTE — Therapy (Signed)
[facility] [place], [place], [address] Phone: [phone]   Fax:  [phone]  Speech Language Pathology Treatment  Patient Details  Name: [name] MRN: [id number] Date of Birth: Aug 19, 1960 No data recorded  Encounter Date: 09/16/2019   End of Session - 09/16/19 1635    Visit Number 3    Number of Visits 6    Date for SLP Re-Evaluation 08/28/19   Pt failed to return to clinic   Authorization Type [place]   no deductible, OOP $900, copay $10, 30 visit limit, auth required   SLP Start Time 1438    SLP Stop Time  1520    SLP Time Calculation (min) 42 min    Activity Tolerance Patient tolerated treatment well           Past Medical History:  Diagnosis Date  . Anxiety   . Arthritis    back  . Bradycardia    a. During 11/2012 adm: lopressor decreased.  . Cancer of larynx ([place])   . Carotid artery stenosis   . 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 Coronary artery disease    a. Diag cath 10/2012 for CP/abnormal nuc -> planned PCI s/p LAD atherectomy/DES placement 11/05/12.  . Dilated aortic root ([place])   . GERD (gastroesophageal reflux disease)   . History of hiatal hernia   . Hypercholesteremia   . Hypertension   . PEG tube malfunction ([place])   . Stroke ([place]) 07/31/2018   bilateral vertebrobasilar occlusion; "left side weaker thanit was before."    Past Surgical History:  Procedure Laterality Date  . CARDIAC CATHETERIZATION  10/29/2012  . CORONARY ANGIOPLASTY WITH STENT PLACEMENT  11/05/2012  . DIRECT LARYNGOSCOPY N/A 01/02/2019   Procedure: MICRO DIRECT LARYNGOSCOPY BIOPSY OF LARYNGEAL MASS;  Surgeon: [name], MD;  Location: [facility];  Service: ENT;  Laterality: N/A;  . ESOPHAGOGASTRODUODENOSCOPY (EGD) WITH PROPOFOL N/A 08/11/2019   Procedure: ATTEMPTED ESOPHAGOGASTRODUODENOSCOPY (EGD) WITH PROPOFOL (UNABLE TO PERFORM  PERCUTANEOUS ENDOSCOPIC GASTROSTOMY TUBE REPLACEMENT);  Surgeon: [name], MD;  Location: [facility];  Service: Gastroenterology;  Laterality: N/A;  . HEMORRHOID SURGERY  ~ 2010  . INSERTION OF MESH N/A 06/02/2015   Procedure: INSERTION OF MESH;  Surgeon: [name], MD;  Location: [facility];  Service: General;  Laterality: N/A;  . IR ANGIO INTRA EXTRACRAN SEL COM CAROTID INNOMINATE BILAT MOD SED  08/02/2018  . IR ANGIO VERTEBRAL SEL VERTEBRAL BILAT MOD SED  08/02/2018  . IR REPLACE G-TUBE SIMPLE WO FLUORO  08/12/2019  . IR REPLC GASTRO/COLONIC TUBE PERCUT W/FLUORO  08/13/2019  . LAPAROSCOPIC INSERTION GASTROSTOMY TUBE N/A 02/24/2019   Procedure: LAPAROSCOPIC INSERTION GASTROSTOMY TUBE;  Surgeon: [name], MD;  Location: [facility];  Service: General;  Laterality: N/A;  . LEFT HEART CATHETERIZATION WITH CORONARY ANGIOGRAM N/A 10/30/2012   Procedure: LEFT HEART CATHETERIZATION WITH CORONARY ANGIOGRAM;  Surgeon: [name], MD;  Location: [facility];  Service: Cardiovascular;  Laterality: N/A;  . MULTIPLE EXTRACTIONS WITH ALVEOLOPLASTY N/A 02/18/2019   Procedure: Extraction of tooth #'s 5-7, 10,11,14,20-29, 31 and 32 with alveoloplasty and maxiillary left buccal exostoses reductions;  Surgeon: [name], [name];  Location: [facility];  Service: Oral Surgery;  Laterality: N/A;  . PERCUTANEOUS CORONARY ROTOBLATOR INTERVENTION (PCI-R) N/A 11/05/2012   Procedure: PERCUTANEOUS CORONARY ROTOBLATOR INTERVENTION (PCI-R);  Surgeon: [name], MD;  Location: [facility];  Service: Cardiovascular;  Laterality: N/A;  . TRACHEOSTOMY TUBE PLACEMENT N/A 02/18/2019   Procedure: Tracheostomy;  Surgeon: [name], MD;  Location: [facility];  Service: ENT;  Laterality: N/A;  . UMBILICAL HERNIA REPAIR N/A 06/02/2015   Procedure: UMBILICAL HERNIORRHAPHY WITH MESH;  Surgeon: [name], MD;  Location: [facility];  Service: General;  Laterality: N/A;    There were no vitals filed for this visit.   Subjective Assessment -  09/16/19 1628    Subjective "I can drink just about anything."    Currently in Pain? No/denies            ADULT SLP TREATMENT - 09/16/19 0001      General Information   Behavior/Cognition Alert;Cooperative;Pleasant mood    Patient Positioning Upright in chair    Oral care provided N/A    HPI [name] is a 59 yo male who was referred for a clinical swallow evaluation by Dr. 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      Treatment Provided   Treatment provided Dysphagia      Dysphagia Treatment   Temperature Spikes Noted No    Respiratory Status Room air    Oral Cavity - Dentition Missing dentition;Poor condition    Treatment Methods Skilled observation;Differential diagnosis;Patient/caregiver education    Patient observed directly with PO's Yes    Type of PO's observed Thin liquids;Nectar-thick liquids;Dysphagia 1 (puree)    Feeding Able to feed self    Liquids provided via Cup;Teaspoon    Oral Phase Signs & Symptoms Oral holding    Pharyngeal Phase Signs & Symptoms Suspected delayed swallow initiation;Multiple swallows;Audible swallow;Delayed cough;Delayed throat clear    Type of cueing Verbal    Amount of cueing Minimal      Pain Assessment   Pain Assessment No/denies pain      Assessment / Recommendations / Plan   Plan MBS      Dysphagia Recommendations   Diet recommendations NPO;Other(comment)   Pt has been consuming thin liquids and donuts   Medication Administration Via alternative means      General Recommendations   Oral Care Recommendations Oral care BID;Oral care prior to ice chips    Follow up Recommendations Outpatient SLP      Progression Toward Goals   Progression toward goals Progressing toward goals            SLP Education - 09/16/19 1634    Education Details Again provided Pt with written HEP (effortful swallow, [place], CTAR, and lingual ROM) to complete at home     Person(s) Educated Patient    Methods Explanation;Demonstration;Handout    Comprehension Verbalized understanding            SLP Short Term Goals - 09/16/19 1638      SLP SHORT TERM GOAL #1   Title Pt will demonstrate safe and efficient consumption of self regulated regular textures with thin liquids with use of strategies as needed.    Baseline Currently using 5-6 cans via tube feeds, some thin liquids and soft solids by mouth    Time 2    Period Months    Status New    Target Date 10/30/19      SLP SHORT TERM GOAL #2   Title Pt will complete pharyngeal swallowing exercises as assigned with use of written cue after initial introduction/model from SLP    Baseline Pt was previously given, has not been  completing    Time 2    Period Months    Status On-going    Target Date 10/30/19      SLP SHORT TERM GOAL #3   Title Complete MBSS now that trach has been removed and Pt has been eating/drinking more by mouth    Baseline Last MBSS was 07/24/19 with trach in place    Time 2    Period Months    Status New    Target Date 10/30/19            SLP Long Term Goals - 09/16/19 1642      SLP LONG TERM GOAL #1   Title Pt will consume self regulated regular textures and thin liquids with use of strategies as needed.          MBSS from 07/24/19 Pt presents with severe pharyngeal phase dysphagia s/p radiation therapy for supraglottic cancer. Pt demonstrated seemingly normal pharyngeal swallow response with initial tsp presentations of thin barium (vallecular trigger) on two trials, however ensuing trials via cup sip thin, tsp NTL, puree, and back to tsp thin yielded penetration during and aspiration after the swallow variably sensed (cough) and unsensed. Pt with blunted/bulbous epiglottis from post radiation effects, reduced epiglottic deflection with heavier bolus (puree), reduced pharyngeal constriction, reduced laryngeal closure, narrowing near PE segment resulting in stasis of bolus  near PES and pyfiroms and aspiration after the swallow from residuals spilling into larynx across consistencies. Question whether the narrowing in pharynx near C4-5 is also due to radiation changes (may benefit from dilation?). Recommend primary source of nutrition via PEG due to high risk for aspiration and continued dysphagia therapy with a focus on Pt taking single tsp presentations of thin water after oral care. Pt should try to wear his PMSV during all waking hours. SLP will try to get records from his most recent ENT appointment.     Plan - 09/16/19 1637    Clinical Impression Statement Pt seen for dysphagia therapy following MBSS completed in 07/24/19 (Pt failed to return to clinic for treatment as requested). He has since had his trach removed and reports that he is drinking by mouth [facility]) and has eaten donuts, cake, and ice cream. He says he is not having any difficulty, however upon questioning regarding coughing, throat clearing, and having to swallow several times for each bite/sip, he states that he does do those things. In session, he consumed ice chips, water, soda, pudding, and nectar thick cranberry. Pt exhibited oral holding, suspected delay in swallow initiation, coughing/throat clearing, and multiple swallows elicited. Imaging from previous MBSS was reviewed with Pt and swallowing exercises were again presented. Pt encouraged to continue with swallowing exercises and ok to continue ice chips and water by mouth after oral care. Will request order for a repeat MBSS now that his trach has been removed and he is eating more by mouth on his own volition.    Speech Therapy Frequency 1x /week   1-2x per month after chemoradiation   Duration 8 weeks   3 months   Treatment/Interventions Aspiration precaution training;SLP instruction and feedback;Pharyngeal strengthening exercises;Compensatory strategies;Patient/family education;Compensatory techniques;Diet toleration management by SLP   MBSS    Potential to Achieve Goals Good    SLP Home Exercise Plan Pt will complete HEP as assigned to facilitate carryover of treatment strategies and techniques in home environment with written cues    Consulted and Agree with Plan of Care Patient  Patient will benefit from skilled therapeutic intervention in order to improve the following deficits and impairments:   Dysphagia, oropharyngeal phase    Problem List Patient Active Problem List   Diagnosis Date Noted  . PEG (percutaneous endoscopic gastrostomy) adjustment/replacement/removal ([place]) 08/11/2019  . PEG tube malfunction ([place]) 08/10/2019  . HCAP (healthcare-associated pneumonia) 04/10/2019  . Febrile illness, acute 04/08/2019  . Malnutrition of moderate degree 04/03/2019  . GI bleed 04/01/2019  . Dehydration 03/28/2019  . RLL pneumonia 03/17/2019  . Hx of tracheostomy 02/18/2019  . Head and neck cancer ([place]) 02/18/2019  . Malignant neoplasm of supraglottis ([place]) 01/22/2019  . Stroke ([place]) 07/31/2018  . Rotator cuff syndrome of right shoulder 02/20/2013  . Bradycardia 11/06/2012  . Coronary artery disease   . Abnormal finding on cardiovascular stress test 09/25/2012  . Hypertension 09/03/2012  . Chest pain 09/03/2012  . Alcohol abuse, daily use 09/03/2012  . Hyperlipidemia 09/03/2012  . Hyponatremia 09/03/2012   Thank you,  Genene Churn, [place]  [facility] 09/16/2019, 4:49 PM  [facility] [address] [place], [place], [address] Phone: [phone]   Fax:  [phone]   Name: LYRICK LAGRAND MRN: [id number] Date of Birth: [birth year]

## 2019-09-17 ENCOUNTER — Inpatient Hospital Stay: Payer: 59 | Attending: Radiation Oncology

## 2019-09-17 NOTE — Progress Notes (Signed)
Nutrition Follow-up:  Patient with supraglottic carcinoma.  Patient has completed chemotherapy and radiation.  Last treatment was on 5/6.  Spoke with wife and patient via phone today.  Spoke with wife first without patient around and she is asking if patient still has cancer.  She says that patient recently told her that he can never be rid of cancer.  She is concerned regarding patient out of character behavior, possible depression.  Planning PCP visit this week.  Patient and wife are being followed by [facility] Education officer, museum.    Patient and wife tell RD that patient has been giving about 5 cartons of tube feeding daily.  Patient is eating more thorough mouth.  Has seen SLP, trach has been removed.  Planning another MBSS without trach.  Noted SLP recommended thin liquids and self regulated soft solids.  Patient has been out of prostat but wife has ordered more.  Patient tells RD that he has been eating donuts, ice cream, 1/2 gravy biscuit. Planning on trying spaghetti tonight.  Patient does not have any teeth and does not know when he can go to the dentist.      Medications: reviewed  Labs: reviewed  Anthropometrics:   Weight increased to 157 lb 9/13 ([facility]) increased from 156 lb on 8/17   NUTRITION DIAGNOSIS: Predicted suboptimal energy intake improving   INTERVENTION:  Discussed goal of reducing tube feeding and increase foods that patient is able to eat orally. Ultimate goal would be for patient to be able to maintain nutrition and weight without using tube and have tube removed.  Patient verbalized this was his goal too. Message sent to MD Isidore Moos and Nurse Navigator regarding when patient can follow-up with dentist and get new teeth.  Recommend patient reduce tube feeding to 4 cartons daily and continue prostat 37ml via tube.  Discussed soft foods that patient can try (eggs, soft cooked vegetables, pudding, ice cream, soups, soft cooked meats in gravy, etc).  Encouraged patient to try  foods at meal times and give feeding in between meals.  Patient has contact information    MONITORING, EVALUATION, GOAL: weight trends, intake, tube feeding   NEXT VISIT: Sept 29, phone f/u  Jalayna Josten B. [name], [place], [place] Registered Dietitian [phone] (mobile)

## 2019-09-18 ENCOUNTER — Telehealth (HOSPITAL_COMMUNITY): Payer: Self-pay

## 2019-09-18 ENCOUNTER — Telehealth (HOSPITAL_COMMUNITY): Payer: Self-pay | Admitting: Speech Pathology

## 2019-09-18 NOTE — Telephone Encounter (Signed)
Nutrition  Wife called and left message for RD inquiring about caregiver support group options.    RD spoke with LCSW and will reach out to wife by phone with options.   RD called wife back and left message letting her know that LCSW will be reaching out to her.   Nalleli Largent B. [name], [place], [place] Registered Dietitian [phone] (mobile)

## 2019-09-18 NOTE — Telephone Encounter (Signed)
Nutrition  RD misread SLP recommendations.  Current recommendations are for patient to be NPO except for ice chips and thin water.  SLP goal for patient is to be on soft solids and thin liquids.    RD called patient and wife and spoke with them both by phone to explain RD's mistake.  Encouraged patient to hold off on foods except for water and ice chips until after MBSS.  Encouraged patient to increase tube feeding back up to 5-6 cartons per day along with prostat.    Wife asking to be called to schedule MBSS as patient forgets things.  Will send message to SLP.    [name], [place], [place] Registered Dietitian [phone] (mobile)

## 2019-09-19 ENCOUNTER — Ambulatory Visit (HOSPITAL_COMMUNITY): Payer: 59 | Admitting: Physical Therapy

## 2019-09-19 ENCOUNTER — Telehealth (HOSPITAL_COMMUNITY): Payer: Self-pay | Admitting: Physical Therapy

## 2019-09-19 ENCOUNTER — Other Ambulatory Visit (HOSPITAL_COMMUNITY): Payer: Self-pay | Admitting: Specialist

## 2019-09-19 DIAGNOSIS — R1319 Other dysphagia: Secondary | ICD-10-CM

## 2019-09-19 DIAGNOSIS — Z9889 Other specified postprocedural states: Secondary | ICD-10-CM

## 2019-09-19 NOTE — Progress Notes (Signed)
Oncology Nurse Navigator Documentation  I spoke with [name]' wife [name] on the phone today. I reviewed [name]' upcoming appointments with her including with GI on 09/30/19, Dr. Benjamine Mola on 11/25/19. I told her that she should be getting a call to schedule [name] for a MBSS. I also advised that he could have dentures fabricated 6 months after completing radiation. She let me know that he did see his PCP today and they plan to start him on a low dose antidepressant due to recent depressed mood noted by [name]. She knows to call me if she has any further questions or concerns.   Jorge Asa RN, BSN, OCN Head & Neck Oncology Nurse [place] at [facility] Phone # [phone]  Fax # [phone]

## 2019-09-19 NOTE — Telephone Encounter (Signed)
pt cancelled appt fo rtoday because of back pain

## 2019-09-23 ENCOUNTER — Encounter (HOSPITAL_COMMUNITY): Payer: Self-pay | Admitting: Physical Therapy

## 2019-09-23 ENCOUNTER — Ambulatory Visit (HOSPITAL_COMMUNITY): Payer: 59 | Admitting: Physical Therapy

## 2019-09-23 ENCOUNTER — Inpatient Hospital Stay (HOSPITAL_COMMUNITY): Payer: 59 | Attending: Hematology | Admitting: General Practice

## 2019-09-23 ENCOUNTER — Other Ambulatory Visit: Payer: Self-pay

## 2019-09-23 DIAGNOSIS — C321 Malignant neoplasm of supraglottis: Secondary | ICD-10-CM

## 2019-09-23 DIAGNOSIS — R1312 Dysphagia, oropharyngeal phase: Secondary | ICD-10-CM | POA: Diagnosis not present

## 2019-09-23 DIAGNOSIS — I890 Lymphedema, not elsewhere classified: Secondary | ICD-10-CM

## 2019-09-23 NOTE — Progress Notes (Signed)
[facility] CSW Progress Notes  Spoke w wife, patient sleeping post lymphedema treatment.  It has been a challenging time - wifes son is currently hospitalized at [facility] and pneumonia.  She is understandably worried about her son.  Patient and wife are now considering getting their own COVID vaccinations.    At this point, concern for their son is overwhelming their concerns about his health.  Dr Gerarda Fraction has seen them and prescribed an antidepressant.  Patient currently is declining to take the medication "the last time I took those, it made me crazy!"  He is currently taking some vitamin "energy" supplements (ProStat)  in addition to his regular tube feeds.  "This is giving him a lot more energy, he is up and walking around."  Reports that he is beginning to feel somewhat better and stress is reducing.  He does, however, continue to need treatment for lymphedema and will undergo testing on his swallowing.  Discussed fact that recovery from head and neck cancer and its treatment is a long and arduous rode.  Given all the multiple stressors (serious illness of son, recent treatment for head/neck cancer, recent trach removal, current tube feeding regimen), wife is experiencing caregiver stress/fatigue.  Will link her w [facility] counseling intern to provide additional layer of support.  Will also notify patient when Head and Neck Finding Your New Normal program begins and invite participation.    Plan: - Per wife, patient to call PCP to discuss another ADP - patient to complete swallow evaluation and continue w recommended tube feeding and supplement schedule, they will communicate w dietitian for any concerns -refer wife to [facility] counseling intern for caregiver support - wife to call to schedule COVID vaccines for patient and self - she has various options for obtaining these at [facility] or local pharmacies.  Patient may schedule at [facility] if desired.   Will call wife in one week to  assess progress.  Edwyna Shell, LCSW Clinical Social Worker Phone:  [phone] Cell:  [phone]

## 2019-09-23 NOTE — Therapy (Signed)
[place], [place], [address] Phone: [phone]   Fax:  [phone]  Physical Therapy Evaluation  Patient Details  Name: [name] MRN: [id number] Date of Birth: [birth year] Referring Provider (PT): Eppie Gibson   Encounter Date: 09/23/2019   PT End of Session - 09/23/19 1133    Visit Number 1    Number of Visits 12    Date for PT Re-Evaluation 10/30/19   unable to start right away   Authorization Type [employer] 30 limit combined    Authorization - Visit Number 1    Progress Note Due on Visit 10    PT Start Time 0915    PT Stop Time 1000    PT Time Calculation (min) 45 min    Activity Tolerance Patient tolerated treatment well    Behavior During Therapy [facility] for tasks assessed/performed           Past Medical History:  Diagnosis Date  . Anxiety   . Arthritis    back  . Bradycardia    a. During 11/2012 adm: lopressor decreased.  . Cancer of larynx ([place])   . Carotid artery stenosis   . 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 Coronary artery disease    a. Diag cath 10/2012 for CP/abnormal nuc -> planned PCI s/p LAD atherectomy/DES placement 11/05/12.  . Dilated aortic root ([place])   . GERD (gastroesophageal reflux disease)   . History of hiatal hernia   . Hypercholesteremia   . Hypertension   . PEG tube malfunction ([place])   . Stroke ([place]) 07/31/2018   bilateral vertebrobasilar occlusion; "left side weaker thanit was before."    Past Surgical History:  Procedure Laterality Date  . CARDIAC CATHETERIZATION  10/29/2012  . CORONARY ANGIOPLASTY WITH STENT PLACEMENT  11/05/2012  . DIRECT LARYNGOSCOPY N/A 01/02/2019   Procedure: MICRO DIRECT LARYNGOSCOPY BIOPSY OF LARYNGEAL MASS;  Surgeon: [name], MD;  [facility];  Service: ENT;  Laterality: N/A;  . ESOPHAGOGASTRODUODENOSCOPY (EGD) WITH PROPOFOL N/A 08/11/2019    Procedure: ATTEMPTED ESOPHAGOGASTRODUODENOSCOPY (EGD) WITH PROPOFOL (UNABLE TO PERFORM PERCUTANEOUS ENDOSCOPIC GASTROSTOMY TUBE REPLACEMENT);  Surgeon: [name], MD;  Location: [facility];  Service: Gastroenterology;  Laterality: N/A;  . HEMORRHOID SURGERY  ~ 2010  . INSERTION OF MESH N/A 06/02/2015   Procedure: INSERTION OF MESH;  Surgeon: [name], MD;  Location: [facility];  Service: General;  Laterality: N/A;  . IR ANGIO INTRA EXTRACRAN SEL COM CAROTID INNOMINATE BILAT MOD SED  08/02/2018  . IR ANGIO VERTEBRAL SEL VERTEBRAL BILAT MOD SED  08/02/2018  . IR REPLACE G-TUBE SIMPLE WO FLUORO  08/12/2019  . IR REPLC GASTRO/COLONIC TUBE PERCUT W/FLUORO  08/13/2019  . LAPAROSCOPIC INSERTION GASTROSTOMY TUBE N/A 02/24/2019   Procedure: LAPAROSCOPIC INSERTION GASTROSTOMY TUBE;  Surgeon: [name], MD;  Location: [facility];  Service: General;  Laterality: N/A;  . LEFT HEART CATHETERIZATION WITH CORONARY ANGIOGRAM N/A 10/30/2012   Procedure: LEFT HEART CATHETERIZATION WITH CORONARY ANGIOGRAM;  Surgeon: [name], MD;  Location: [facility] CATH LAB;  Service: Cardiovascular;  Laterality: N/A;  . MULTIPLE EXTRACTIONS WITH ALVEOLOPLASTY N/A 02/18/2019   Procedure: Extraction of tooth #'s 5-7, 10,11,14,20-29, 31 and 32 with alveoloplasty and maxiillary left buccal exostoses reductions;  Surgeon: [name], [name];  Location: [facility];  Service: Oral Surgery;  Laterality: N/A;  . PERCUTANEOUS CORONARY ROTOBLATOR  INTERVENTION (PCI-R) N/A 11/05/2012   Procedure: PERCUTANEOUS CORONARY ROTOBLATOR INTERVENTION (PCI-R);  Surgeon: [name], MD;  Location: [facility];  Service: Cardiovascular;  Laterality: N/A;  . TRACHEOSTOMY TUBE PLACEMENT N/A 02/18/2019   Procedure: Tracheostomy;  Surgeon: [name], MD;  Location: [facility];  Service: ENT;  Laterality: N/A;  . UMBILICAL HERNIA REPAIR N/A 06/02/2015   Procedure: UMBILICAL HERNIORRHAPHY WITH MESH;  Surgeon: [name], MD;  [facility];  Service: General;   Laterality: N/A;    There were no vitals filed for this visit.        [facility] PT Assessment - 09/23/19 0001      Assessment   Medical Diagnosis cervical lymphedema     Referring Provider (PT) Eppie Gibson    Onset Date/Surgical Date 05/03/19    Prior Therapy none       Precautions   Precautions None      Restrictions   Weight Bearing Restrictions No      Balance Screen   Has the patient fallen in the past 6 months No      Prior Function   Level of Independence Independent;Independent with homemaking with wheelchair      Cognition   Overall Cognitive Status Within Functional Limits for tasks assessed             LYMPHEDEMA/ONCOLOGY QUESTIONNAIRE - 09/23/19 0001      What other symptoms do you have   Are you Having Heaviness or Tightness No    Are you having Pain No    Is it Hard or Difficult finding clothes that fit No      Lymphedema Stage   Stage STAGE 2 SPONTANEOUSLY IRREVERSIBLE      Lymphedema Assessments   Lymphedema Assessments Head and Neck      Head and Neck   Right Lateral Nostril at base of nose to medial tragus  8.8 cm    Left Lateral Nostril at base of nose to medial tragus  8.6 cm    Right Corner of mouth to where ear lobe meets face 8 cm    Left Corner of mouth to where ear lobe meets face 8.8 cm    4 cm superior to sternal notch around neck 41.5 cm    6 cm superior to sternal notch around neck 42 cm                   Objective measurements completed on examination: See above findings.       [facility] PT Treatment/Exercise - 09/23/19 0001      Manual Therapy   Manual Therapy Manual Lymphatic Drainage (MLD)    Manual therapy comments done seperate from all other aspects of treatment     Manual Lymphatic Drainage (MLD) for chest, neck  and head                  PT Education - 09/23/19 1132    Education Details self manual 1-8, (deep lymphatic system    Person(s) Educated Patient    Methods Explanation;Tactile  cues;Verbal cues;Handout    Comprehension Verbalized understanding            PT Short Term Goals - 09/23/19 1140      PT SHORT TERM GOAL #1   Title PT will be I in self manual techniques to assist in decreasing volume from cervical area to improve swallowing .    Time 2    Period Weeks    Status  New    Target Date 10/14/19             PT Long Term Goals - 09/23/19 1141      PT LONG TERM GOAL #1   Title PT  to be I in all self manual steps for cervical lymphedema to decrease edema to allow improved swallowing.    Time 4    Period Weeks    Status New    Target Date 10/28/19      PT LONG TERM GOAL #2   Title PT to have at least 60 degrees cervical rotation to allow safer driving    Time 4    Period Weeks    Status New      PT LONG TERM GOAL #3   Title PT to have lost at least .8 cm at cervical sites which have been measured.    Time 4    Period Weeks    Status New                  Plan - 09/23/19 1135    Clinical Impression Statement Mr. Siever is a 59 yo male referred to skilled PT for cervical lymphedema, he denies oral issues.  Mr. Roessner states that the swelling has actually gone down quite a bit,( however, there is noted residual edema).  Mr Mineau will benefit from skilled therapy to teach self manual techniques and educate in compression therapy. Pt voiced that wife might be able to come in and learn decongestive techniques.    Personal Factors and Comorbidities Comorbidity 1;Fitness    Examination-Activity Limitations Other   swallowing / self image   Examination-Participation Restrictions Other   swallowing   Stability/Clinical Decision Making Evolving/Moderate complexity    Clinical Decision Making Moderate    Rehab Potential Good    PT Frequency 3x / week    PT Duration 4 weeks    PT Treatment/Interventions Patient/family education;Manual lymph drainage;Compression bandaging    PT Next Visit Plan Record cervica; ROM and give HEP, give foam  compression and explain wearing times, educate in cervical aspect to decongestive techniques.    PT Home Exercise Plan 1-8 decongestive techniques    Consulted and Agree with Plan of Care Patient           Patient will benefit from skilled therapeutic intervention in order to improve the following deficits and impairments:  Increased fascial restricitons, Increased muscle spasms, Decreased range of motion, Increased edema  Visit Diagnosis: Lymphedema, not elsewhere classified     Problem List Patient Active Problem List   Diagnosis Date Noted  . PEG (percutaneous endoscopic gastrostomy) adjustment/replacement/removal ([place]) 08/11/2019  . PEG tube malfunction ([place]) 08/10/2019  . HCAP (healthcare-associated pneumonia) 04/10/2019  . Febrile illness, acute 04/08/2019  . Malnutrition of moderate degree 04/03/2019  . GI bleed 04/01/2019  . Dehydration 03/28/2019  . RLL pneumonia 03/17/2019  . Hx of tracheostomy 02/18/2019  . Head and neck cancer ([place]) 02/18/2019  . 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 Stroke ([place]) 07/31/2018  . Rotator cuff syndrome of right shoulder 02/20/2013  . Bradycardia 11/06/2012  . Coronary artery disease   . Abnormal finding on cardiovascular stress test 09/25/2012  . Hypertension 09/03/2012  . Chest pain 09/03/2012  . Alcohol abuse, daily use 09/03/2012  . Hyperlipidemia 09/03/2012  . Hyponatremia 09/03/2012   Rayetta Humphrey, PT CLT [phone] 09/23/2019, 11:46 AM  [place], [place], [address] Phone: [phone]  Fax:  [phone]  Name: [name] MRN: [id number] Date of Birth: 04-Jan-1960

## 2019-09-25 ENCOUNTER — Ambulatory Visit (HOSPITAL_COMMUNITY): Payer: 59 | Admitting: Speech Pathology

## 2019-09-25 ENCOUNTER — Other Ambulatory Visit: Payer: Self-pay

## 2019-09-25 ENCOUNTER — Encounter (HOSPITAL_COMMUNITY): Payer: Self-pay | Admitting: Speech Pathology

## 2019-09-25 ENCOUNTER — Ambulatory Visit (HOSPITAL_COMMUNITY)
Admission: RE | Admit: 2019-09-25 | Discharge: 2019-09-25 | Disposition: A | Discharge status: Home / Self Care | Payer: 59 | Source: Ambulatory Visit | Attending: Radiation Oncology | Admitting: Radiation Oncology

## 2019-09-25 ENCOUNTER — Encounter (INDEPENDENT_AMBULATORY_CARE_PROVIDER_SITE_OTHER): Payer: Self-pay | Admitting: *Deleted

## 2019-09-25 DIAGNOSIS — Z9889 Other specified postprocedural states: Secondary | ICD-10-CM | POA: Insufficient documentation

## 2019-09-25 DIAGNOSIS — R1319 Other dysphagia: Secondary | ICD-10-CM

## 2019-09-25 DIAGNOSIS — R1312 Dysphagia, oropharyngeal phase: Secondary | ICD-10-CM

## 2019-09-25 NOTE — Therapy (Signed)
[place], [place], [address] Phone: [phone]   Fax:  [phone]  Modified Barium Swallow  Patient Details  Name: [name] MRN: [id number] Date of Birth: 1960/02/08 No data recorded  Encounter Date: 09/25/2019   End of Session - 09/25/19 1337    Visit Number 1    Number of Visits 1    Authorization Type [employer]    SLP Start Time 1150    SLP Stop Time  1228    SLP Time Calculation (min) 38 min    Activity Tolerance Patient tolerated treatment well           Past Medical History:  Diagnosis Date  . Anxiety   . Arthritis    back  . Bradycardia    a. During 11/2012 adm: lopressor decreased.  . Cancer of larynx ([place])   . Carotid artery stenosis   . 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 Coronary artery disease    a. Diag cath 10/2012 for CP/abnormal nuc -> planned PCI s/p LAD atherectomy/DES placement 11/05/12.  . Dilated aortic root ([place])   . GERD (gastroesophageal reflux disease)   . History of hiatal hernia   . Hypercholesteremia   . Hypertension   . PEG tube malfunction ([place])   . Stroke ([place]) 07/31/2018   bilateral vertebrobasilar occlusion; "left side weaker thanit was before."    Past Surgical History:  Procedure Laterality Date  . CARDIAC CATHETERIZATION  10/29/2012  . CORONARY ANGIOPLASTY WITH STENT PLACEMENT  11/05/2012  . DIRECT LARYNGOSCOPY N/A 01/02/2019   Procedure: MICRO DIRECT LARYNGOSCOPY BIOPSY OF LARYNGEAL MASS;  Surgeon: [name], MD;  [facility];  Service: ENT;  Laterality: N/A;  . ESOPHAGOGASTRODUODENOSCOPY (EGD) WITH PROPOFOL N/A 08/11/2019   Procedure: ATTEMPTED ESOPHAGOGASTRODUODENOSCOPY (EGD) WITH PROPOFOL (UNABLE TO PERFORM PERCUTANEOUS ENDOSCOPIC GASTROSTOMY TUBE REPLACEMENT);  Surgeon: [name], MD;  Location: [facility];  Service: Gastroenterology;  Laterality: N/A;  .  HEMORRHOID SURGERY  ~ 2010  . INSERTION OF MESH N/A 06/02/2015   Procedure: INSERTION OF MESH;  Surgeon: [name], MD;  Location: [facility];  Service: General;  Laterality: N/A;  . IR ANGIO INTRA EXTRACRAN SEL COM CAROTID INNOMINATE BILAT MOD SED  08/02/2018  . IR ANGIO VERTEBRAL SEL VERTEBRAL BILAT MOD SED  08/02/2018  . IR REPLACE G-TUBE SIMPLE WO FLUORO  08/12/2019  . IR REPLC GASTRO/COLONIC TUBE PERCUT W/FLUORO  08/13/2019  . LAPAROSCOPIC INSERTION GASTROSTOMY TUBE N/A 02/24/2019   Procedure: LAPAROSCOPIC INSERTION GASTROSTOMY TUBE;  Surgeon: [name], MD;  Location: [facility];  Service: General;  Laterality: N/A;  . LEFT HEART CATHETERIZATION WITH CORONARY ANGIOGRAM N/A 10/30/2012   Procedure: LEFT HEART CATHETERIZATION WITH CORONARY ANGIOGRAM;  Surgeon: [name], MD;  Location: [facility] CATH LAB;  Service: Cardiovascular;  Laterality: N/A;  . MULTIPLE EXTRACTIONS WITH ALVEOLOPLASTY N/A 02/18/2019   Procedure: Extraction of tooth #'s 5-7, 10,11,14,20-29, 31 and 32 with alveoloplasty and maxiillary left buccal exostoses reductions;  Surgeon: [name], [name];  Location: [facility];  Service: Oral Surgery;  Laterality: N/A;  . PERCUTANEOUS CORONARY ROTOBLATOR INTERVENTION (PCI-R) N/A 11/05/2012   Procedure: PERCUTANEOUS CORONARY ROTOBLATOR INTERVENTION (PCI-R);  Surgeon: [name], MD;  Location: [facility] CATH LAB;  Service: Cardiovascular;  Laterality: N/A;  . TRACHEOSTOMY TUBE PLACEMENT N/A 02/18/2019   Procedure: Tracheostomy;  Surgeon: [name], MD;  Location:  [facility] OR;  Service: ENT;  Laterality: N/A;  . UMBILICAL HERNIA REPAIR N/A 06/02/2015   Procedure: UMBILICAL HERNIORRHAPHY WITH MESH;  Surgeon: [name], MD;  [facility];  Service: General;  Laterality: N/A;    There were no vitals filed for this visit.   Subjective Assessment - 09/25/19 1312    Subjective "I like Mt. Dew."    Special Tests MBSS    Currently in Pain? No/denies               General - 09/25/19 1315       General Information   Date of Onset 01/02/19    HPI [name] is a 59 yo male with  supraglottic carcinoma. Laryngoscopy and biopsy on 01/02/2019, trach placed on 02/18/2019. PET scan on 01/27/2019 showed hypermetabolic laryngeal lesion with hypermetabolic cervical metastatic disease bilaterally. Weekly cisplatin and radiation started on 03/07/2019, many interruptions due to hospitalizations. Patient completed chemotherapy ( last weekly cisplatin on 05/15/2019) and radiation (03/07/2019 through 05/08/2019). He also has a history of a bilateral pontine stroke in 07/2018. He last saw Dr. Enrique Sack in June 2021 and a bony spicule in the area of #20 on the buccal aspect that is tender to palpation was identified and will ideally need aveoloplasty. His trach has been removed and he has a PEG.    Type of Study MBS-Modified Barium Swallow Study    Diet Prior to this Study NPO    Temperature Spikes Noted No    Respiratory Status Room air    History of Recent Intubation No    Behavior/Cognition Alert;Cooperative;Pleasant mood    Oral Cavity Assessment Within Functional Limits    Oral Care Completed by SLP No    Oral Cavity - Dentition Missing dentition;Poor condition      Vision Functional for self feeding    Self-Feeding Abilities Able to feed self    Patient Positioning Upright in chair    Baseline Vocal Quality Normal    Volitional Cough Strong    Volitional Swallow Able to elicit    Anatomy Within functional limits    Pharyngeal Secretions Not observed secondary MBS              Oral Preparation/Oral Phase - 09/25/19 1327      Oral - Nectar   Oral - Nectar Teaspoon Not tested      Oral - Thin   Oral - Thin Teaspoon Within functional limits    Oral - Thin Cup Within functional limits      Oral - Solids   Oral - Puree Piecemeal swallowing    Oral - Mechanical Soft Piecemeal swallowing    Oral - Pill Piecemeal swallowing      Electrical stimulation - Oral Phase   Was Electrical  Stimulation Used No            Pharyngeal Phase - 09/25/19 1328      Pharyngeal - Nectar   Pharyngeal- Nectar Teaspoon Not tested      Pharyngeal - Thin   Pharyngeal- Thin Teaspoon Swallow initiation at vallecula;Reduced epiglottic inversion;Penetration/Apiration after swallow;Reduced airway/laryngeal closure    Pharyngeal Material does not enter airway;Material enters airway, CONTACTS cords and not ejected out    Pharyngeal- Thin Cup Swallow initiation at vallecula;Swallow initiation at pyriform sinus;Reduced epiglottic inversion;Reduced airway/laryngeal closure;Penetration/Apiration after swallow;Trace aspiration;Moderate aspiration;Pharyngeal residue - pyriform;Pharyngeal residue - cp segment    Pharyngeal Material does not enter airway;Material enters airway, passes BELOW cords without attempt by patient to eject out (  silent aspiration)      Pharyngeal - Solids   Pharyngeal- Puree Swallow initiation at vallecula;Reduced epiglottic inversion;Reduced pharyngeal peristalsis;Pharyngeal residue - posterior pharnyx;Pharyngeal residue - cp segment;Penetration/Apiration after swallow    Pharyngeal Material enters airway, CONTACTS cords and then ejected out    Pharyngeal- Pill Swallow initiation at vallecula;Reduced epiglottic inversion;Pharyngeal residue - posterior pharnyx;Pharyngeal residue - cp segment      Pharyngeal Phase - Comment   Pharyngeal Comment narrowing near UES limits passage of bolus and results in penetration/aspiration after the swallow      Electrical Stimulation - Pharyngeal Phase   Was Electrical Stimulation Used No            Cricopharyngeal Phase - 09/25/19 1335      Cervical Esophageal Phase   Cervical Esophageal Phase Impaired      Cervical Esophageal Phase - Solids   Pill Reduced cricopharyngeal relaxation;Other (Comment)      Cervical Esophageal Phase - Comment   Cervical Esophageal Comment narrowing near C4-5 results in reduced bolus passage                 SLP Short Term Goals - 09/16/19 1638      SLP SHORT TERM GOAL #1   Title Pt will demonstrate safe and efficient consumption of self regulated regular textures with thin liquids with use of strategies as needed.    Baseline Currently using 5-6 cans via tube feeds, some thin liquids and soft solids by mouth    Time 2    Period Months    Status New    Target Date 10/30/19      SLP SHORT TERM GOAL #2   Title Pt will complete pharyngeal swallowing exercises as assigned with use of written cue after initial introduction/model from SLP    Baseline Pt was previously given, has not been completing    Time 2    Period Months    Status On-going    Target Date 10/30/19      SLP SHORT TERM GOAL #3   Title Complete MBSS now that trach has been removed and Pt has been eating/drinking more by mouth    Baseline Last MBSS was 07/24/19 with trach in place    Time 2    Period Months    Status New    Target Date 10/30/19            SLP Long Term Goals - 09/16/19 1642      SLP LONG TERM GOAL #1   Title Pt will consume self regulated regular textures and thin liquids with use of strategies as needed.            Plan - 09/25/19 1337    Clinical Impression Statement Pt seen for a repeat MBSS after having had his trach removed. He presents with mild/moderate oropharyngeal dysphagia and moderate pharyngoesophageal dysphagia characterized by piecemeal deglutition of solids, min decreased tongue base retraction and epiglottic deflection with blunting of epiglottis noted from post radiation sequelae, and reduced UES with narrowing noted in pharyngeal space near C4-5 which did not allow bolus to completely clear. The residuals remaining, occasionally spilled between the arytenoids and were aspirated (both silent and audible). Pt with increased residuals near PES with puree and solids. Pt appeared to benefit slightly from a head turn to the Right while swallowing puree/semi-solids, however  still needed liquid wash to eventually clear pharynx. The barium tablet did not pass through the narrowing near C4-5 and was eventually expectorated. Pt aspirated  trace and occasionally gross amounts of thin (when attempting to clear the pill) and could not be completely removed despite spontaneous and cued cough. Noted that Pt has an appointment with GI soon and perhaps he will be able to undergo pharyngoesophageal dilation if found to be indicated (suspect changes due to effects from radiation) which may facilitate pharyngeal clearance. Pt is at high and known risk for aspiration given the above, however he can supplement tube feeds with oral diet at this time with use of aspiration precautions and strict oral care to decrease bacterial load. Pt to start D1/puree and thin liquids via SMALL sips (swallow 2x for each bite/sip) and continued dysphagia therapy weekly.    Speech Therapy Frequency 1x /week   1-2x per month after chemoradiation   Duration 8 weeks   3 months   Treatment/Interventions Aspiration precaution training;SLP instruction and feedback;Pharyngeal strengthening exercises;Compensatory strategies;Patient/family education;Compensatory techniques;Diet toleration management by SLP   MBSS   Potential to Achieve Goals Good    SLP Home Exercise Plan Pt will complete HEP as assigned to facilitate carryover of treatment strategies and techniques in home environment with written cues    Consulted and Agree with Plan of Care Patient           Patient will benefit from skilled therapeutic intervention in order to improve the following deficits and impairments:   Dysphagia, oropharyngeal phase     Recommendations/Treatment - 09/25/19 1336      Swallow Evaluation Recommendations   Recommended Consults --   GI/ENT for possible dilation of UES?   SLP Diet Recommendations Dysphagia 1 (puree);Thin    Liquid Administration via No straw;Cup    Medication Administration Via alternative means     Supervision Patient able to self feed    Compensations Small sips/bites;Multiple dry swallows after each bite/sip;Hard cough after swallow    Postural Changes Seated upright at 90 degrees;Remain upright for at least 30 minutes after feeds/meals            Prognosis - 09/25/19 1337      Prognosis   Prognosis for Safe Diet Advancement Fair    Barriers to Reach Goals Severity of deficits      Individuals Consulted   Consulted and Agree with Results and Recommendations Patient    Report Sent to  Referring physician           Problem List Patient Active Problem List   Diagnosis Date Noted  . PEG (percutaneous endoscopic gastrostomy) adjustment/replacement/removal ([place]) 08/11/2019  . PEG tube malfunction ([place]) 08/10/2019  . HCAP (healthcare-associated pneumonia) 04/10/2019  . Febrile illness, acute 04/08/2019  . Malnutrition of moderate degree 04/03/2019  . GI bleed 04/01/2019  . Dehydration 03/28/2019  . RLL pneumonia 03/17/2019  . Hx of tracheostomy 02/18/2019  . Head and neck cancer ([place]) 02/18/2019  . 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 Stroke ([place]) 07/31/2018  . Rotator cuff syndrome of right shoulder 02/20/2013  . Bradycardia 11/06/2012  . Coronary artery disease   . Abnormal finding on cardiovascular stress test 09/25/2012  . Hypertension 09/03/2012  . Chest pain 09/03/2012  . Alcohol abuse, daily use 09/03/2012  . Hyperlipidemia 09/03/2012  . Hyponatremia 09/03/2012   Thank you,  Genene Churn, [place]  [facility] 09/25/2019, 1:38 PM  [facility] [address] [place], [place], [address] Phone: [phone]   Fax:  [phone]  Name: [name] MRN: [id number] Date of Birth: May 05, 1960

## 2019-09-30 ENCOUNTER — Encounter: Payer: Self-pay | Admitting: Nurse Practitioner

## 2019-09-30 ENCOUNTER — Encounter (HOSPITAL_COMMUNITY): Payer: Self-pay | Admitting: General Practice

## 2019-09-30 ENCOUNTER — Ambulatory Visit (INDEPENDENT_AMBULATORY_CARE_PROVIDER_SITE_OTHER): Payer: 59 | Admitting: Nurse Practitioner

## 2019-09-30 ENCOUNTER — Telehealth: Payer: Self-pay

## 2019-09-30 ENCOUNTER — Telehealth: Payer: Self-pay | Admitting: *Deleted

## 2019-09-30 ENCOUNTER — Inpatient Hospital Stay (HOSPITAL_COMMUNITY): Payer: 59 | Admitting: General Practice

## 2019-09-30 VITALS — BP 110/70 | HR 84 | Ht 69.0 in | Wt 158.0 lb

## 2019-09-30 DIAGNOSIS — C321 Malignant neoplasm of supraglottis: Secondary | ICD-10-CM | POA: Diagnosis not present

## 2019-09-30 DIAGNOSIS — Z1211 Encounter for screening for malignant neoplasm of colon: Secondary | ICD-10-CM | POA: Diagnosis not present

## 2019-09-30 MED ORDER — NA SULFATE-K SULFATE-MG SULF 17.5-3.13-1.6 GM/177ML PO SOLN: 1.0000 | Freq: Once | ORAL | 0 refills | Status: AC

## 2019-09-30 NOTE — Progress Notes (Signed)
[facility] CSW Progress Notes  Scheduled phone follow up call to patient and wife.  No answer, left VM w my contact information and encouragement to call back.  Edwyna Shell, LCSW Clinical Social Worker Phone:  [phone] Cell:  [phone]

## 2019-09-30 NOTE — Patient Instructions (Signed)
If you are age 58 or older, your body mass index should be between 23-30. Your Body mass index is 23.33 kg/m. If this is out of the aforementioned range listed, please consider follow up with your Primary Care Provider.  If you are age 26 or younger, your body mass index should be between 19-25. Your Body mass index is 23.33 kg/m. If this is out of the aformentioned range listed, please consider follow up with your Primary Care Provider.   If you are not tolerating a soft diet in the next couple weeks please let us know.  Due to recent changes in healthcare laws, you may see the results of your imaging and laboratory studies on MyChart before your provider has had a chance to review them.  We understand that in some cases there may be results that are confusing or concerning to you. Not all laboratory results come back in the same time frame and the provider may be waiting for multiple results in order to interpret others.  Please give [place] 48 hours in order for your provider to thoroughly review all the results before contacting the office for clarification of your results.

## 2019-09-30 NOTE — Telephone Encounter (Signed)
Contacted patient to verify telephone visit for pre reg °

## 2019-09-30 NOTE — Progress Notes (Signed)
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  --s/p chemotherapy / radiation. Completed radiation in May. PET scan c/w complete response but there is some mild SUV uptake that is likely related to posttreatment inflammation.  --To confirm complete response to therapy Radiation Oncology referred him to ENT for laryngoscopy . Also requesting EGD because initial PET showed some upper esophageal involvement  --Will arrange for EGD. Will schedule tentatively at [facility] but will clear with Anesthesia given patient's hx of head / neck cancer.  --Has PEG tube for nutrition but tolerating fluids and ice cream by mouth. Advanced to soft diet last week but he has been scared to try even though he denies dysphagia. Patient will try to advance to soft diet. If unable, will call us as this may change endoscopy plans. He should be fine for drinking bowel prep though he could also prep via G-tube  # Colon cancer screening.  --PCP in process of referring him for screening colonoscopy in [facility]. .  --Offered him a screening colonoscopy to be done at time of EGD. The risks and benefits of EGD were discussed and the patient agrees to proceed.  Marland Kitchen    HISTORY OF PRESENT ILLNESS     Primary Gastroenterologist : new Theme park manager) Chief Complaint : needs EGD  CHUNG CHAGOYA is a 59 y.o. male with PMH / [place] significant for,  but not necessarily limited to: CVA , hyperlipidemia, GERD supraglottic cancer. Patient referred by Eppie Gibson, MD for EGD. Patient diagnosed with cancer of supraglottic at the beginning of the year. He underwent chemotherapy then radiation. Radiation finished in May. PET in August suggests complete response but Radiation Oncologist requesting EGD as initial PET scan suggested some upper esophageal involvement. Most recent PET in August shows some stranding in soft tissues above tracheostomy site probably related to  prior radiation or inflammation. .  Patient has a gastrostomy tube but is able to swallow liquids. Told last week that he could start soft foods but so far has only tried ice cream. He admits to being a little scare to advance diet but denies dysphagia.    No Pelzer of colon cancer. Patient's PCP was in process of referring him for a screening colonoscopy. No bowel changes or blood in stool.    Past Medical History:  Diagnosis Date  . Anxiety   . Arthritis    back  . Bradycardia    a. During 11/2012 adm: lopressor decreased.  . Cancer of larynx ([place])   . Carotid artery stenosis   . 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 Coronary artery disease    a. Diag cath 10/2012 for CP/abnormal nuc -> planned PCI s/p LAD atherectomy/DES placement 11/05/12.  . Dilated aortic root ([place])   . GERD (gastroesophageal reflux disease)   . History of hiatal hernia   . Hypercholesteremia   . Hypertension   . PEG tube malfunction ([place])   . Stroke ([place]) 07/31/2018   bilateral vertebrobasilar occlusion; "left side weaker thanit was before."     Past Surgical History:  Procedure Laterality Date  . CARDIAC CATHETERIZATION  10/29/2012  . CORONARY ANGIOPLASTY WITH STENT PLACEMENT  11/05/2012  . DIRECT LARYNGOSCOPY N/A 01/02/2019   Procedure: MICRO DIRECT LARYNGOSCOPY BIOPSY OF LARYNGEAL MASS;  Surgeon: [name], MD;  Location: [facility]  OR;  Service: ENT;  Laterality: N/A;  . ESOPHAGOGASTRODUODENOSCOPY (EGD) WITH PROPOFOL N/A 08/11/2019   Procedure: ATTEMPTED ESOPHAGOGASTRODUODENOSCOPY (EGD) WITH PROPOFOL (UNABLE TO PERFORM PERCUTANEOUS ENDOSCOPIC GASTROSTOMY TUBE REPLACEMENT);  Surgeon: [name], MD;  Location: [facility] ORS;  Service: Gastroenterology;  Laterality: N/A;  . HEMORRHOID SURGERY  ~ 2010  . INSERTION OF MESH N/A 06/02/2015   Procedure: INSERTION OF MESH;  Surgeon: [name], MD;  Location: [facility] ORS;  Service: General;   Laterality: N/A;  . IR ANGIO INTRA EXTRACRAN SEL COM CAROTID INNOMINATE BILAT MOD SED  08/02/2018  . IR ANGIO VERTEBRAL SEL VERTEBRAL BILAT MOD SED  08/02/2018  . IR REPLACE G-TUBE SIMPLE WO FLUORO  08/12/2019  . IR REPLC GASTRO/COLONIC TUBE PERCUT W/FLUORO  08/13/2019  . LAPAROSCOPIC INSERTION GASTROSTOMY TUBE N/A 02/24/2019   Procedure: LAPAROSCOPIC INSERTION GASTROSTOMY TUBE;  Surgeon: [name], MD;  Location: [place];  Service: General;  Laterality: N/A;  . LEFT HEART CATHETERIZATION WITH CORONARY ANGIOGRAM N/A 10/30/2012   Procedure: LEFT HEART CATHETERIZATION WITH CORONARY ANGIOGRAM;  Surgeon: [name], MD;  Location: [place] CATH LAB;  Service: Cardiovascular;  Laterality: N/A;  . MULTIPLE EXTRACTIONS WITH ALVEOLOPLASTY N/A 02/18/2019   Procedure: Extraction of tooth #'s 5-7, 10,11,14,20-29, 31 and 32 with alveoloplasty and maxiillary left buccal exostoses reductions;  Surgeon: [name], [name];  Location: [place];  Service: Oral Surgery;  Laterality: N/A;  . PERCUTANEOUS CORONARY ROTOBLATOR INTERVENTION (PCI-R) N/A 11/05/2012   Procedure: PERCUTANEOUS CORONARY ROTOBLATOR INTERVENTION (PCI-R);  Surgeon: [name], MD;  Location: [facility] CATH LAB;  Service: Cardiovascular;  Laterality: N/A;  . TRACHEOSTOMY TUBE PLACEMENT N/A 02/18/2019   Procedure: Tracheostomy;  Surgeon: [name], MD;  Location: [place];  Service: ENT;  Laterality: N/A;  . UMBILICAL HERNIA REPAIR N/A 06/02/2015   Procedure: UMBILICAL HERNIORRHAPHY WITH MESH;  Surgeon: [name], MD;  Location: [facility];  Service: General;  Laterality: N/A;   Family History  Problem Relation Age of Onset  . Hypertension Mother   . Heart disease Father   . Hypertension Father   . Heart disease Sister   . Hypertension Maternal Grandmother   . Hypertension Maternal Grandfather   . Hypertension Paternal Grandmother   . Hypertension Paternal Grandfather   . Pancreatic cancer Maternal Uncle   . Breast cancer Maternal Aunt   . Breast  cancer Maternal Aunt   . Diabetes Paternal Uncle   . Diabetes Paternal Aunt    Social History   Tobacco Use  . Smoking status: Former Smoker    Packs/day: 3.00    Years: 32.00    Pack years: 96.00    Types: Cigarettes    Start date: 08/29/1977    Quit date: 11/29/2006    Years since quitting: 12.8  . Smokeless tobacco: Never Used  . Tobacco comment: 11/05/2012 "chewed tobacco when I play ball; aien't chewed since age 89"  Vaping Use  . Vaping Use: Never used  Substance Use Topics  . Alcohol use: Not Currently    Alcohol/week: 12.0 standard drinks    Types: 12 Cans of beer per week    Comment: None since 02/2019   . Drug use: Not Currently    Types: Marijuana    Comment: denies on 04/08/19   Current Outpatient Medications  Medication Sig Dispense Refill  . acetaminophen (TYLENOL) 325 MG tablet Take 2 tablets (650 mg total) by mouth every 6 (six) hours as needed for mild pain, fever or headache (or Fever >/= 101). 12 tablet  0  . Amino Acids-Protein Hydrolys (FEEDING SUPPLEMENT, PRO-STAT SUGAR FREE 64,) LIQD Place 30 mLs into feeding tube 3 (three) times daily with meals. 887 mL 2  . aspirin EC 81 MG tablet Take 81 mg by mouth daily.     Marland Kitchen atorvastatin (LIPITOR) 40 MG tablet Take 40 mg by mouth daily.    . diazepam (VALIUM) 10 MG tablet Place 10 mg into feeding tube 3 (three) times daily as needed.     . diphenoxylate-atropine (LOMOTIL) 2.5-0.025 MG tablet Place 2 tablets into feeding tube 4 (four) times daily as needed for diarrhea or loose stools.     . famotidine (PEPCID) 20 MG tablet Place 20 mg into feeding tube daily.     Marland Kitchen HYDROcodone-acetaminophen (NORCO) 10-325 MG tablet Take 1 tablet by mouth every 4 (four) hours as needed. 50 tablet 0  . lidocaine (XYLOCAINE) 2 % solution Patient: Mix 1part 2% viscous lidocaine, 1part H20. Swish & swallow 63mL of diluted mixture, 73min before meals and at bedtime, up to QID 200 mL 4  . metoprolol tartrate (LOPRESSOR) 25 MG tablet Take 12.5  mg by mouth 2 (two) times daily.    . Nutritional Supplements (FEEDING SUPPLEMENT, OSMOLITE 1.5 CAL,) LIQD Give 2 cartons of osmolite 1.5 at 9:30am, 1:30, 5:30 and 1 carton at 9:30pm.  Flush with 33ml of water before and 16ml after each feeding. Meets 100% of patient needs. Send bolus supplies. (Patient taking differently: Place 1,000 mLs into feeding tube in the morning, at noon, in the evening, and at bedtime. Give 2 cartons of osmolite 1.5 at 9:30am, 1:30, 5:30 and 1 carton at 9:30pm.  Flush with 29ml of water before and 130ml after each feeding. Meets 100% of patient needs. Send bolus supplies.) 1659 mL 0  . Nutritional Supplements (PROSOURCE TF) LIQD Give 59ml 2 times per day via feeding tube.  Mix with 39ml of water.  Flush tube with 5ml of water, then place prosource and water mixture in tube and flush with 47ml of water after. (Patient taking differently: Place 30 mLs into feeding tube in the morning and at bedtime. Mix with 56ml of water.  Flush tube with 58ml of water, then place prosource and water mixture in tube and flush with 64ml of water after.) 60 mL 3  . omeprazole (PRILOSEC) 20 MG capsule Take 20 mg by mouth daily.     . prochlorperazine (COMPAZINE) 10 MG tablet TAKE 1 TABLET(10 MG) BY MOUTH EVERY 6 HOURS AS NEEDED FOR NAUSEA 60 tablet 2  . traMADol (ULTRAM) 50 MG tablet Take 50-100 mg by mouth 4 (four) times daily as needed (pain.).     Marland Kitchen Water For Irrigation, Sterile (FREE WATER) SOLN Place 100 mLs into feeding tube See admin instructions. Please Flush PEG tube with 1 ounce (30 ml) before giving to feed, and then flush with 3 ounces (9ml) after giving to feed--- up to 4 times a day 1000 mL 23  . nitroGLYCERIN (NITROSTAT) 0.4 MG SL tablet Place 1 tablet (0.4 mg total) under the tongue every 5 (five) minutes as needed for chest pain (CP or SOB). (Patient not taking: Reported on 09/30/2019) 25 tablet 3   No current facility-administered medications for this visit.   Allergies    Allergen Reactions  . Cymbalta [Duloxetine Hcl]   . Duloxetine Other (See Comments)    Caused depression  . Prednisone Other (See Comments)    Chest pain     Review of Systems: All systems reviewed and negative except  where noted in HPI.   PHYSICAL EXAM :    Wt Readings from Last 3 Encounters:  09/30/19 158 lb (71.7 kg)  09/15/19 157 lb (71.2 kg)  08/19/19 156 lb (70.8 kg)    BP 110/70 (BP Location: Left Arm, Patient Position: Sitting, Cuff Size: Normal)   Pulse 84   Ht 5\' 9"  (1.753 m) Comment: height measured without shoes  Wt 158 lb (71.7 kg)   BMI 23.33 kg/m  Constitutional:  Pleasant thin male in no acute distress. Psychiatric: Normal mood and affect. Behavior is normal. EENT: Pupils normal.  Conjunctivae are normal. No scleral icterus. Neck supple.  Cardiovascular: Normal rate, regular rhythm. No edema Pulmonary/chest: Effort normal and breath sounds normal. No wheezing, rales or rhonchi. Abdominal: Soft, nondistended, nontender. Bowel sounds active throughout. There are no masses palpable. No hepatomegaly. Gastrostomy tube in place Neurological: Alert and oriented to person place and time. Skin: Skin is warm and dry. No rashes noted.  Tye Savoy, NP  09/30/2019, 3:13 PM  Cc:  Referring Provider Eppie Gibson, MD

## 2019-09-30 NOTE — Progress Notes (Signed)
[facility] CSW Progress Notes  Call to patient/wife for support/assessment of progress. Spoke w wife - she is very Patent attorney of call from [facility] counseling intern and plans to continue talking w her.  Wants transfer wheelchair - referred to [facility] [employer] that can help w donated equipment.  Also referred to Gibson for additional help as they are experiencing financial distress.  Will call again in two weeks to follow up.  Edwyna Shell, LCSW Clinical Social Worker Phone:  [phone]

## 2019-10-01 ENCOUNTER — Inpatient Hospital Stay: Payer: 59

## 2019-10-01 NOTE — Progress Notes (Signed)
Nutrition Follow-up:   Patient with supraglottic carcinoma.  Patient has completed chemotherapy and radiation therapy (5/6).  Spoke with patient via phone for nutrition follow-up.  Patient reports that he is taking 4-5 cartons of tube feeding via feeding tube along with prostat daily.  Drink 2 sports drinks as well (gatorade/powerade). Noted patient met with SLP. Has been eating ice cream, mashed potatoes, popsciles.     Medications: reviewed  Labs: reviewed  Anthropometrics:   Weight 158 lb 9/28 increased from 157 lb on 9/13 [facility] clinic)   NUTRITION DIAGNOSIS: Predicted suboptimal energy intake improving   INTERVENTION:  Continue 4-5 cartons of tube feeding via feeding tube with prostat 76ml daily.   Continue oral intake per SLP recommendations (puree with thin liquids). Stressed importance of SLP follow-up.      MONITORING, EVALUATION, GOAL: weight trends, intake, tube feeding   NEXT VISIT: October 27, phone f/u  Folashade Gamboa B. [name], [place], [place] Registered Dietitian [phone] (mobile)

## 2019-10-02 ENCOUNTER — Telehealth (HOSPITAL_COMMUNITY): Payer: Self-pay | Admitting: Physical Therapy

## 2019-10-02 ENCOUNTER — Ambulatory Visit (HOSPITAL_COMMUNITY): Payer: 59 | Admitting: Physical Therapy

## 2019-10-02 NOTE — Telephone Encounter (Signed)
pt's wife called to cx today's appt due to he has a sore throat

## 2019-10-03 NOTE — Progress Notes (Signed)
Attending Physician's Attestation   I have reviewed the chart.   I agree with the Advanced Practitioner's note, impression, and recommendations with any updates as below. Complex patient with multiple issues.  After further evaluation it looks like an attempted endoscopy was performed by one of our surgeons in the past few months but was unable to traverse the UES likely from his prior therapy.  Certainly things may be better now but his procedures cannot be considered in the endoscopy center and we need to be done in the hospital.  If the patient had an attempt with nasopharyngeal endoscope to pass the UES and was not successful then potential consideration of going through the patient's G-tube site and a retroflex positioning in effort of trying to evaluate the upper GI tract could be considered.  Having fluoroscopy available may be helpful in regards to better understanding potential role of dilation.  If patient is able to tolerate colonoscopy preparation through G-tube or by above and wants to clean out as well to get him up-to-date for colon cancer screening we can proceed with that as well.  Further discussion with the patient will be important prior to the patient's evaluation.  If only endoscopy to be considered would schedule in a 60-minute hospital block if both EGD/colonoscopy can be considered then 90-minute hospital block will be necessary.  Again, his procedures cannot be done in the [facility].   Justice Britain, MD [facility] Advanced Endoscopy Office # [phone]

## 2019-10-06 ENCOUNTER — Encounter (HOSPITAL_COMMUNITY): Payer: 59 | Admitting: Physical Therapy

## 2019-10-06 ENCOUNTER — Other Ambulatory Visit: Payer: Self-pay | Admitting: *Deleted

## 2019-10-06 NOTE — Patient Outreach (Signed)
[facility]) Care Management  10/06/2019  TYSHEEM ACCARDO 04-Jul-1960 [id number]  Telephone outreach.  Mr. Borkenhagen reports he has been prescribed diazepam for his depression and has taken it and it has not helped his depression. He admits it helped him to feel calm and relaxed but has not improved his mood.  He says he has increased his PO intake and is still giving himself his nutritional supplement via his gastrostomy tube.   He has stated outpt rehab and this is going well but slow to help him improve his endurance. He does use a cane.  He denies any other issues to address today.  Have advised for him to call his providers office and ask for an appointment to evaluate his depression. He said that he would.  Will call again in one month and have encouraged he or his wife to call me for any other issues.  Eulah Pont. Myrtie Neither, MSN, GNP-BC Gerontological Nurse Practitioner [facility] Care Management [phone]  Further chart review revealed pt has been prescribed Duloxetine for depression but when called Mr. Bumgarner back he says that it took away all his motivation to do anything, he just wanted to stay in bed. "It made me feel like I was dead." Advised again to call and make appt with his primary care office for re-evaluation.

## 2019-10-07 ENCOUNTER — Other Ambulatory Visit (HOSPITAL_COMMUNITY): Payer: 59 | Admitting: General Practice

## 2019-10-07 ENCOUNTER — Other Ambulatory Visit: Payer: Self-pay

## 2019-10-07 ENCOUNTER — Ambulatory Visit (HOSPITAL_COMMUNITY): Payer: 59 | Attending: Internal Medicine | Admitting: Physical Therapy

## 2019-10-07 ENCOUNTER — Encounter (HOSPITAL_COMMUNITY): Payer: Self-pay | Admitting: Physical Therapy

## 2019-10-07 DIAGNOSIS — R1312 Dysphagia, oropharyngeal phase: Secondary | ICD-10-CM | POA: Insufficient documentation

## 2019-10-07 DIAGNOSIS — I890 Lymphedema, not elsewhere classified: Secondary | ICD-10-CM | POA: Diagnosis present

## 2019-10-07 NOTE — Progress Notes (Signed)
[name],  Can you schedule patient for EGD and colonoscopy to be done at the hospital with [facility]?  He needs a 90 minute time slot, complex case. It is for dysphagia, hx of supraglottis cancer and colon cancer screening. Thanks PG
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
## 2019-10-08 ENCOUNTER — Telehealth (HOSPITAL_COMMUNITY): Payer: Self-pay | Admitting: Speech Pathology

## 2019-10-08 ENCOUNTER — Other Ambulatory Visit: Payer: Self-pay

## 2019-10-08 ENCOUNTER — Telehealth: Payer: Self-pay

## 2019-10-08 ENCOUNTER — Ambulatory Visit (HOSPITAL_COMMUNITY): Payer: 59 | Admitting: Speech Pathology

## 2019-10-08 ENCOUNTER — Ambulatory Visit (HOSPITAL_COMMUNITY): Payer: 59 | Admitting: Physical Therapy

## 2019-10-08 NOTE — Telephone Encounter (Signed)
Spoke with the patient's spouse. [facility] procedure cancelled. Moved to [facility] 11/24/19. New instructions printed to mail. Accepts this appointment.  COVID screening scheduled. The patient has an appointment on 11/20/19. He health is such that he fatigues easily. COVID test 01/21/19 morning.

## 2019-10-08 NOTE — Telephone Encounter (Signed)
He has called to cx both apptments today for ST and PT - He is hurting too much to come.

## 2019-10-10 ENCOUNTER — Encounter (HOSPITAL_COMMUNITY): Payer: Self-pay | Admitting: Physical Therapy

## 2019-10-10 ENCOUNTER — Other Ambulatory Visit: Payer: Self-pay

## 2019-10-10 ENCOUNTER — Ambulatory Visit (HOSPITAL_COMMUNITY): Payer: 59 | Admitting: Physical Therapy

## 2019-10-10 DIAGNOSIS — I890 Lymphedema, not elsewhere classified: Secondary | ICD-10-CM | POA: Diagnosis not present
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
## 2019-10-13 ENCOUNTER — Ambulatory Visit (HOSPITAL_COMMUNITY): Payer: 59 | Admitting: Speech Pathology

## 2019-10-13 ENCOUNTER — Encounter (HOSPITAL_COMMUNITY): Payer: Self-pay | Admitting: Physical Therapy

## 2019-10-13 ENCOUNTER — Ambulatory Visit (HOSPITAL_COMMUNITY): Payer: 59 | Admitting: Physical Therapy

## 2019-10-13 ENCOUNTER — Encounter (HOSPITAL_COMMUNITY): Payer: Self-pay | Admitting: Speech Pathology

## 2019-10-13 ENCOUNTER — Other Ambulatory Visit: Payer: Self-pay

## 2019-10-13 DIAGNOSIS — I890 Lymphedema, not elsewhere classified: Secondary | ICD-10-CM

## 2019-10-13 DIAGNOSIS — R1312 Dysphagia, oropharyngeal phase: Secondary | ICD-10-CM

## 2019-10-13 NOTE — Therapy (Signed)
[place], [place], [address] Phone: [phone]   Fax:  [phone]  Physical Therapy Treatment  Patient Details  Name: [name] MRN: [id number] Date of Birth: 1960/10/15 Referring Provider (PT): Eppie Gibson   Encounter Date: 10/13/2019   PT End of Session - 10/13/19 1215    Visit Number 4    Number of Visits 12    Date for PT Re-Evaluation 10/30/19    Authorization Type [employer] 30 limit combined    Authorization - Visit Number 4    Progress Note Due on Visit 10    PT Start Time 1130    PT Stop Time 1210    PT Time Calculation (min) 40 min           Past Medical History:  Diagnosis Date  . Anxiety   . Arthritis    back  . Bradycardia    a. During 11/2012 adm: lopressor decreased.  . Cancer of larynx ([place])   . Carotid artery stenosis   . 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 Coronary artery disease    a. Diag cath 10/2012 for CP/abnormal nuc -> planned PCI s/p LAD atherectomy/DES placement 11/05/12.  . Dilated aortic root ([place])   . GERD (gastroesophageal reflux disease)   . History of hiatal hernia   . Hypercholesteremia   . Hypertension   . PEG tube malfunction ([place])   . Stroke ([place]) 07/31/2018   bilateral vertebrobasilar occlusion; "left side weaker thanit was before."    Past Surgical History:  Procedure Laterality Date  . CARDIAC CATHETERIZATION  10/29/2012  . CORONARY ANGIOPLASTY WITH STENT PLACEMENT  11/05/2012  . DIRECT LARYNGOSCOPY N/A 01/02/2019   Procedure: MICRO DIRECT LARYNGOSCOPY BIOPSY OF LARYNGEAL MASS;  Surgeon: [name], MD;  [facility];  Service: ENT;  Laterality: N/A;  . ESOPHAGOGASTRODUODENOSCOPY (EGD) WITH PROPOFOL N/A 08/11/2019   Procedure: ATTEMPTED ESOPHAGOGASTRODUODENOSCOPY (EGD) WITH PROPOFOL (UNABLE TO PERFORM PERCUTANEOUS ENDOSCOPIC GASTROSTOMY TUBE REPLACEMENT);   Surgeon: [name], MD;  Location: [facility];  Service: Gastroenterology;  Laterality: N/A;  . HEMORRHOID SURGERY  ~ 2010  . INSERTION OF MESH N/A 06/02/2015   Procedure: INSERTION OF MESH;  Surgeon: [name], MD;  Location: [facility];  Service: General;  Laterality: N/A;  . IR ANGIO INTRA EXTRACRAN SEL COM CAROTID INNOMINATE BILAT MOD SED  08/02/2018  . IR ANGIO VERTEBRAL SEL VERTEBRAL BILAT MOD SED  08/02/2018  . IR REPLACE G-TUBE SIMPLE WO FLUORO  08/12/2019  . IR REPLC GASTRO/COLONIC TUBE PERCUT W/FLUORO  08/13/2019  . LAPAROSCOPIC INSERTION GASTROSTOMY TUBE N/A 02/24/2019   Procedure: LAPAROSCOPIC INSERTION GASTROSTOMY TUBE;  Surgeon: [name], MD;  Location: [facility];  Service: General;  Laterality: N/A;  . LEFT HEART CATHETERIZATION WITH CORONARY ANGIOGRAM N/A 10/30/2012   Procedure: LEFT HEART CATHETERIZATION WITH CORONARY ANGIOGRAM;  Surgeon: [name], MD;  Location: [facility] CATH [facility];  Service: Cardiovascular;  Laterality: N/A;  . MULTIPLE EXTRACTIONS WITH ALVEOLOPLASTY N/A 02/18/2019   Procedure: Extraction of tooth #'s 5-7, 10,11,14,20-29, 31 and 32 with alveoloplasty and maxiillary left buccal exostoses reductions;  Surgeon: [name], [name];  Location: [facility];  Service: Oral Surgery;  Laterality: N/A;  . PERCUTANEOUS CORONARY ROTOBLATOR INTERVENTION (PCI-R) N/A 11/05/2012   Procedure: PERCUTANEOUS CORONARY ROTOBLATOR INTERVENTION (PCI-R);  Surgeon: [name], MD;  Location: [facility];  Service:  Cardiovascular;  Laterality: N/A;  . TRACHEOSTOMY TUBE PLACEMENT N/A 02/18/2019   Procedure: Tracheostomy;  Surgeon: [name], MD;  Location: [facility] OR;  Service: ENT;  Laterality: N/A;  . UMBILICAL HERNIA REPAIR N/A 06/02/2015   Procedure: UMBILICAL HERNIORRHAPHY WITH MESH;  Surgeon: [name], MD;  [facility];  Service: General;  Laterality: N/A;    There were no vitals filed for this visit.   Subjective Assessment - 10/13/19 1136    Subjective Pt has no complaint.     Currently in Pain? No/denies                 LYMPHEDEMA/ONCOLOGY QUESTIONNAIRE - 10/13/19 0001      Head and Neck   Right Lateral Nostril at base of nose to medial tragus  8.6 cm    Left Lateral Nostril at base of nose to medial tragus  8.8 cm    Right Corner of mouth to where ear lobe meets face 8.1 cm    Left Corner of mouth to where ear lobe meets face 8.5 cm    4 cm superior to sternal notch around neck 40.5 cm    6 cm superior to sternal notch around neck 42 cm                      [facility] Adult PT Treatment/Exercise - 10/13/19 0001      Exercises   Exercises Neck      Neck Exercises: Seated   Other Seated Exercise postural exercise x 5      Neck Exercises: Supine   Neck Retraction 5 reps    Neck Retraction Limitations scapular retraction x 5       Manual Therapy   Manual Therapy Soft tissue mobilization;Manual Lymphatic Drainage (MLD);Compression Bandaging    Manual therapy comments done seperate from all other aspects of treatment     Soft tissue mobilization for tight anterior neck mm     Manual Lymphatic Drainage (MLD) for chest, neck  and head                    PT Short Term Goals - 10/07/19 1616      PT SHORT TERM GOAL #1   Title PT will be I in self manual techniques to assist in decreasing volume from cervical area to improve swallowing .    Time 2    Period Weeks    Status Achieved    Target Date 10/14/19             PT Long Term Goals - 10/07/19 1617      PT LONG TERM GOAL #1   Title PT  to be I in all self manual steps for cervical lymphedema to decrease edema to allow improved swallowing.    Time 4    Period Weeks    Status On-going      PT LONG TERM GOAL #2   Title PT to have at least 60 degrees cervical rotation to allow safer driving    Time 4    Period Weeks    Status On-going      PT LONG TERM GOAL #3   Title PT to have lost at least .8 cm at cervical sites which have been measured.    Time 4    Period  Weeks    Status On-going                 Plan - 10/13/19 1216  Clinical Impression Statement Measurement taken with minimal change since last measurment.  PT states he has not been completing the manual as much as he has neuropathy in his hands but he has been wearing the compression.    Personal Factors and Comorbidities Comorbidity 1;Fitness    Examination-Activity Limitations Other   swallowing / self image   Examination-Participation Restrictions Other   swallowing   Stability/Clinical Decision Making Evolving/Moderate complexity    Rehab Potential Good    PT Frequency 3x / week    PT Duration 4 weeks    PT Treatment/Interventions Patient/family education;Manual lymph drainage;Compression bandaging    PT Next Visit Plan , continue to  educate in cervical aspect to decongestive techniques as wife will not be able to be active in the instruction.    PT Home Exercise Plan 1-8 decongestive techniques    Consulted and Agree with Plan of Care Patient           Patient will benefit from skilled therapeutic intervention in order to improve the following deficits and impairments:  Increased fascial restricitons, Increased muscle spasms, Decreased range of motion, Increased edema  Visit Diagnosis: Lymphedema, not elsewhere classified     Problem List Patient Active Problem List   Diagnosis Date Noted  . PEG (percutaneous endoscopic gastrostomy) adjustment/replacement/removal ([place]) 08/11/2019  . PEG tube malfunction ([place]) 08/10/2019  . HCAP (healthcare-associated pneumonia) 04/10/2019  . Febrile illness, acute 04/08/2019  . Malnutrition of moderate degree 04/03/2019  . GI bleed 04/01/2019  . Dehydration 03/28/2019  . RLL pneumonia 03/17/2019  . Hx of tracheostomy 02/18/2019  . Head and neck cancer ([place]) 02/18/2019  . 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 Stroke ([place]) 07/31/2018  . Rotator cuff syndrome of right shoulder 02/20/2013  . Bradycardia  11/06/2012  . Coronary artery disease   . Abnormal finding on cardiovascular stress test 09/25/2012  . Hypertension 09/03/2012  . Chest pain 09/03/2012  . Alcohol abuse, daily use 09/03/2012  . Hyperlipidemia 09/03/2012  . Hyponatremia 09/03/2012    Rayetta Humphrey, PT CLT [phone] 10/13/2019, 12:18 PM  [facility] [address] [place], [place], [address] Phone: [phone]   Fax:  [phone]  Name: [name] MRN: [id number] Date of Birth: 07-May-1960

## 2019-10-13 NOTE — Therapy (Signed)
[place], [place], [address] Phone: [phone]   Fax:  [phone]  Speech Language Pathology Treatment  Patient Details  Name: [name] MRN: [id number] Date of Birth: March 11, 1960 No data recorded  Encounter Date: 10/13/2019   End of Session - 10/13/19 1134    Visit Number 2    Number of Visits 8    Date for SLP Re-Evaluation 11/13/19    Authorization Type [employer]    SLP Start Time 3557    SLP Stop Time  1122    SLP Time Calculation (min) 42 min    Activity Tolerance Patient tolerated treatment well           Past Medical History:  Diagnosis Date  . Anxiety   . Arthritis    back  . Bradycardia    a. During 11/2012 adm: lopressor decreased.  . Cancer of larynx ([place])   . Carotid artery stenosis   . 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 Coronary artery disease    a. Diag cath 10/2012 for CP/abnormal nuc -> planned PCI s/p LAD atherectomy/DES placement 11/05/12.  . Dilated aortic root ([place])   . GERD (gastroesophageal reflux disease)   . History of hiatal hernia   . Hypercholesteremia   . Hypertension   . PEG tube malfunction ([place])   . Stroke ([place]) 07/31/2018   bilateral vertebrobasilar occlusion; "left side weaker thanit was before."    Past Surgical History:  Procedure Laterality Date  . CARDIAC CATHETERIZATION  10/29/2012  . CORONARY ANGIOPLASTY WITH STENT PLACEMENT  11/05/2012  . DIRECT LARYNGOSCOPY N/A 01/02/2019   Procedure: MICRO DIRECT LARYNGOSCOPY BIOPSY OF LARYNGEAL MASS;  Surgeon: [name], MD;  [facility];  Service: ENT;  Laterality: N/A;  . ESOPHAGOGASTRODUODENOSCOPY (EGD) WITH PROPOFOL N/A 08/11/2019   Procedure: ATTEMPTED ESOPHAGOGASTRODUODENOSCOPY (EGD) WITH PROPOFOL (UNABLE TO PERFORM PERCUTANEOUS ENDOSCOPIC GASTROSTOMY TUBE REPLACEMENT);  Surgeon: [name], MD;  [facility];   Service: Gastroenterology;  Laterality: N/A;  . HEMORRHOID SURGERY  ~ 2010  . INSERTION OF MESH N/A 06/02/2015   Procedure: INSERTION OF MESH;  Surgeon: [name], MD;  [facility];  Service: General;  Laterality: N/A;  . IR ANGIO INTRA EXTRACRAN SEL COM CAROTID INNOMINATE BILAT MOD SED  08/02/2018  . IR ANGIO VERTEBRAL SEL VERTEBRAL BILAT MOD SED  08/02/2018  . IR REPLACE G-TUBE SIMPLE WO FLUORO  08/12/2019  . IR REPLC GASTRO/COLONIC TUBE PERCUT W/FLUORO  08/13/2019  . LAPAROSCOPIC INSERTION GASTROSTOMY TUBE N/A 02/24/2019   Procedure: LAPAROSCOPIC INSERTION GASTROSTOMY TUBE;  Surgeon: [name], MD;  Location: [facility];  Service: General;  Laterality: N/A;  . LEFT HEART CATHETERIZATION WITH CORONARY ANGIOGRAM N/A 10/30/2012   Procedure: LEFT HEART CATHETERIZATION WITH CORONARY ANGIOGRAM;  Surgeon: [name], MD;  Location: [facility] CATH LAB;  Service: Cardiovascular;  Laterality: N/A;  . MULTIPLE EXTRACTIONS WITH ALVEOLOPLASTY N/A 02/18/2019   Procedure: Extraction of tooth #'s 5-7, 10,11,14,20-29, 31 and 32 with alveoloplasty and maxiillary left buccal exostoses reductions;  Surgeon: [name], [name];  Location: [facility];  Service: Oral Surgery;  Laterality: N/A;  . PERCUTANEOUS CORONARY ROTOBLATOR INTERVENTION (PCI-R) N/A 11/05/2012   Procedure: PERCUTANEOUS CORONARY ROTOBLATOR INTERVENTION (PCI-R);  Surgeon: [name], MD;  Location: [facility] CATH LAB;  Service: Cardiovascular;  Laterality: N/A;  . TRACHEOSTOMY TUBE PLACEMENT N/A 02/18/2019  Procedure: Tracheostomy;  Surgeon: [name], MD;  Location: [facility] OR;  Service: ENT;  Laterality: N/A;  . UMBILICAL HERNIA REPAIR N/A 06/02/2015   Procedure: UMBILICAL HERNIORRHAPHY WITH MESH;  Surgeon: [name], MD;  [facility];  Service: General;  Laterality: N/A;    There were no vitals filed for this visit.   Subjective Assessment - 10/13/19 1130    Subjective "I have been eating cream potatoes"    Currently in Pain? No/denies     Multiple Pain Sites No             ADULT SLP TREATMENT - 10/13/19 1131      General Information   Behavior/Cognition Alert;Cooperative;Pleasant mood    Patient Positioning Upright in chair    Oral care provided N/A    HPI [name] is a 59 yo male who was referred for a clinical swallow evaluation by Dr. 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      Treatment Provided   Treatment provided Dysphagia      Dysphagia Treatment   Temperature Spikes Noted No    Respiratory Status Room air    Oral Cavity - Dentition Missing dentition;Poor condition    Treatment Methods Skilled observation;Differential diagnosis;Patient/caregiver education    Patient observed directly with PO's Yes    Type of PO's observed Dysphagia 3 (soft);Thin liquids    Feeding Able to feed self    Liquids provided via Cup    Pharyngeal Phase Signs & Symptoms Suspected delayed swallow initiation;Multiple swallows;Audible swallow;Delayed cough;Delayed throat clear    Type of cueing Verbal    Amount of cueing Minimal      Assessment / Recommendations / Plan   Plan Continue with current plan of care      Dysphagia Recommendations   Diet recommendations Dysphagia 2 (fine chop);Thin liquid    Liquids provided via Cup;No straw    Medication Administration Crushed with puree    Supervision Patient able to self feed    Compensations Small sips/bites;Multiple dry swallows after each bite/sip;Follow solids with liquid    Postural Changes and/or Swallow Maneuvers Seated upright 90 degrees;Upright 30-60 min after meal;Head turn right during swallow   head turn R for semi solids     General Recommendations   Oral Care Recommendations Oral care BID;Oral care prior to ice chips    Follow up Recommendations Outpatient SLP      Progression Toward Goals   Progression toward goals Progressing toward goals            SLP Education -  10/13/19 1133    Education Details small sips/bites, swallow 2x for each bite/sip, head turn right with semi solids, oral care    Person(s) Educated Patient    Methods Explanation;Handout    Comprehension Verbalized understanding            SLP Short Term Goals - 10/13/19 1135      SLP SHORT TERM GOAL #1   Title Pt will demonstrate safe and efficient consumption of self regulated regular textures with thin liquids with use of strategies as needed.    Baseline Currently using 5-6 cans via tube feeds, some thin liquids and soft solids by mouth    Time 2    Period Months    Status On-going    Target Date 10/30/19      SLP SHORT TERM GOAL #2   Title Pt will complete pharyngeal swallowing exercises as assigned  with use of written cue after initial introduction/model from SLP    Baseline Pt was previously given, has not been completing    Time 2    Period Months    Status On-going    Target Date 10/30/19      SLP SHORT TERM GOAL #3   Title Complete MBSS now that trach has been removed and Pt has been eating/drinking more by mouth    Baseline Last MBSS was 07/24/19 with trach in place    Time 2    Period Months    Status Achieved    Target Date 10/30/19            SLP Long Term Goals - 09/16/19 1642      SLP LONG TERM GOAL #1   Title Pt will consume self regulated regular textures and thin liquids with use of strategies as needed.            Plan - 10/13/19 1135    Clinical Impression Statement Pt seen for dysphagia therapy following MBSS. Pt canceled several appointments since it was completed. He had been consuming some foods by mouth (popsicle, Mt. Dew, water, creamed potatoes, and beef stew). He initially states that he does not have difficulty swallowing, but then coughed throughout po trials this date. He stated that he is going to see Dr. Benjamine Mola, ENT, tomorrow due to coughing and sore throat. SLP reviewed imaging and results from MBSS with Pt. He was reminded of post  radiation sequelae and its impact on swallow function. He has no teeth and had some difficulty with trials of diced peaches today. He was again given soft food ideas via handout and encouraged to place foods in a food processor or blend to chop foods. He was also provided a food journal and asked to document all po intake to share with SLP and RD. He is using 5 cartons of tube feeds currently. He was again instructed on swallowing exercises to implement, as he has not been completing (he did not bring his folder). He was instructed to complete CTAR (chin tuck against resistance and swallow) in addition to po trials. SLP will continue to follow.    Speech Therapy Frequency 1x /week   1-2x per month after chemoradiation   Duration 8 weeks   3 months   Treatment/Interventions Aspiration precaution training;SLP instruction and feedback;Pharyngeal strengthening exercises;Compensatory strategies;Patient/family education;Compensatory techniques;Diet toleration management by SLP   MBSS   Potential to Achieve Goals Good    SLP Home Exercise Plan Pt will complete HEP as assigned to facilitate carryover of treatment strategies and techniques in home environment with written cues    Consulted and Agree with Plan of Care Patient           Patient will benefit from skilled therapeutic intervention in order to improve the following deficits and impairments:   Dysphagia, oropharyngeal phase    Problem List Patient Active Problem List   Diagnosis Date Noted  . PEG (percutaneous endoscopic gastrostomy) adjustment/replacement/removal ([place]) 08/11/2019  . PEG tube malfunction ([place]) 08/10/2019  . HCAP (healthcare-associated pneumonia) 04/10/2019  . Febrile illness, acute 04/08/2019  . Malnutrition of moderate degree 04/03/2019  . GI bleed 04/01/2019  . Dehydration 03/28/2019  . RLL pneumonia 03/17/2019  . Hx of tracheostomy 02/18/2019  . Head and neck cancer ([place]) 02/18/2019  . Malignant neoplasm of  supraglottis ([place]) 01/22/2019  . Stroke ([place]) 07/31/2018  . Rotator cuff syndrome of right shoulder 02/20/2013  . Bradycardia 11/06/2012  .  Coronary artery disease   . Abnormal finding on cardiovascular stress test 09/25/2012  . Hypertension 09/03/2012  . Chest pain 09/03/2012  . Alcohol abuse, daily use 09/03/2012  . Hyperlipidemia 09/03/2012  . Hyponatremia 09/03/2012   Thank you,  Genene Churn, [place]  [facility] 10/13/2019, 4:20 PM  [facility] [address] [place], [place], [address] Phone: [phone]   Fax:  [phone]   Name: EESA JUSTISS MRN: [id number] Date of Birth: 1960/10/20

## 2019-10-14 ENCOUNTER — Inpatient Hospital Stay (HOSPITAL_COMMUNITY): Payer: 59 | Attending: Hematology | Admitting: General Practice

## 2019-10-14 DIAGNOSIS — C321 Malignant neoplasm of supraglottis: Secondary | ICD-10-CM

## 2019-10-14 NOTE — Progress Notes (Signed)
[facility] CSW Progress Notes  Call to wife to check on progress.  They appreciate a box of food from [employer] which was delivered today.  Aware they can request other help from this agency if needed. Wife is reconnecting with church family - recently went to in person service. She has been cautious regarding in person gatherings during [place] and is aware of the seriousness of this disease. Son was recently hospitalized w a severe case of COVID.  At this point, she feels more supported. Points to church and family support, as well as expressing appreciation for help from [employer].  Encouraged her to contact us as needed going forward.  Edwyna Shell, LCSW Clinical Social Worker Phone:  [phone]

## 2019-10-15 ENCOUNTER — Other Ambulatory Visit: Payer: Self-pay

## 2019-10-15 ENCOUNTER — Ambulatory Visit (HOSPITAL_COMMUNITY): Payer: 59 | Admitting: Physical Therapy

## 2019-10-15 DIAGNOSIS — I890 Lymphedema, not elsewhere classified: Secondary | ICD-10-CM | POA: Diagnosis not present

## 2019-10-15 NOTE — Therapy (Signed)
[facility] [address] [place], [place], [address] Phone: [phone]   Fax:  [phone]  Physical Therapy Treatment  Patient Details  Name: [name] MRN: [id number] Date of Birth: 1960-01-04 Referring Provider (PT): Eppie Gibson   Encounter Date: 10/15/2019   PT End of Session - 10/15/19 1129    Visit Number 5    Number of Visits 12    Date for PT Re-Evaluation 10/30/19    Authorization Type [employer] 30 limit combined    Authorization - Visit Number 5    Progress Note Due on Visit 10    PT Start Time 1054   pt late   PT Stop Time 1125    PT Time Calculation (min) 31 min    Activity Tolerance Patient tolerated treatment well    Behavior During Therapy [facility] for tasks assessed/performed           Past Medical History:  Diagnosis Date  . Anxiety   . Arthritis    back  . Bradycardia    a. During 11/2012 adm: lopressor decreased.  . Cancer of larynx ([place])   . Carotid artery stenosis   . 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 Coronary artery disease    a. Diag cath 10/2012 for CP/abnormal nuc -> planned PCI s/p LAD atherectomy/DES placement 11/05/12.  . Dilated aortic root ([place])   . GERD (gastroesophageal reflux disease)   . History of hiatal hernia   . Hypercholesteremia   . Hypertension   . PEG tube malfunction ([place])   . Stroke ([place]) 07/31/2018   bilateral vertebrobasilar occlusion; "left side weaker thanit was before."    Past Surgical History:  Procedure Laterality Date  . CARDIAC CATHETERIZATION  10/29/2012  . CORONARY ANGIOPLASTY WITH STENT PLACEMENT  11/05/2012  . DIRECT LARYNGOSCOPY N/A 01/02/2019   Procedure: MICRO DIRECT LARYNGOSCOPY BIOPSY OF LARYNGEAL MASS;  Surgeon: [name], MD;  [facility];  Service: ENT;  Laterality: N/A;  . ESOPHAGOGASTRODUODENOSCOPY (EGD) WITH PROPOFOL N/A 08/11/2019   Procedure: ATTEMPTED  ESOPHAGOGASTRODUODENOSCOPY (EGD) WITH PROPOFOL (UNABLE TO PERFORM PERCUTANEOUS ENDOSCOPIC GASTROSTOMY TUBE REPLACEMENT);  Surgeon: [name], MD;  Location: [facility];  Service: Gastroenterology;  Laterality: N/A;  . HEMORRHOID SURGERY  ~ 2010  . INSERTION OF MESH N/A 06/02/2015   Procedure: INSERTION OF MESH;  Surgeon: [name], MD;  Location: [facility];  Service: General;  Laterality: N/A;  . IR ANGIO INTRA EXTRACRAN SEL COM CAROTID INNOMINATE BILAT MOD SED  08/02/2018  . IR ANGIO VERTEBRAL SEL VERTEBRAL BILAT MOD SED  08/02/2018  . IR REPLACE G-TUBE SIMPLE WO FLUORO  08/12/2019  . IR REPLC GASTRO/COLONIC TUBE PERCUT W/FLUORO  08/13/2019  . LAPAROSCOPIC INSERTION GASTROSTOMY TUBE N/A 02/24/2019   Procedure: LAPAROSCOPIC INSERTION GASTROSTOMY TUBE;  Surgeon: [name], MD;  Location: [facility];  Service: General;  Laterality: N/A;  . LEFT HEART CATHETERIZATION WITH CORONARY ANGIOGRAM N/A 10/30/2012   Procedure: LEFT HEART CATHETERIZATION WITH CORONARY ANGIOGRAM;  Surgeon: [name], MD;  Location: [facility] CATH LAB;  Service: Cardiovascular;  Laterality: N/A;  . MULTIPLE EXTRACTIONS WITH ALVEOLOPLASTY N/A 02/18/2019   Procedure: Extraction of tooth #'s 5-7, 10,11,14,20-29, 31 and 32 with alveoloplasty and maxiillary left buccal exostoses reductions;  Surgeon: [name], [name];  Location: [facility];  Service: Oral Surgery;  Laterality: N/A;  . PERCUTANEOUS CORONARY ROTOBLATOR INTERVENTION (PCI-R) N/A  11/05/2012   Procedure: PERCUTANEOUS CORONARY ROTOBLATOR INTERVENTION (PCI-R);  Surgeon: [name], MD;  Location: [facility] CATH LAB;  Service: Cardiovascular;  Laterality: N/A;  . TRACHEOSTOMY TUBE PLACEMENT N/A 02/18/2019   Procedure: Tracheostomy;  Surgeon: [name], MD;  Location: [facility];  Service: ENT;  Laterality: N/A;  . UMBILICAL HERNIA REPAIR N/A 06/02/2015   Procedure: UMBILICAL HERNIORRHAPHY WITH MESH;  Surgeon: [name], MD;  [facility];  Service: General;  Laterality: N/A;     There were no vitals filed for this visit.   Subjective Assessment - 10/15/19 1055    Subjective PT states that  he has been self massaging , his throat has been sore.  States that the manual seems to be making his Lt ear hurt, no pain on the right though.  He is wearing his compression for two hours at a time.    Currently in Pain? Yes    Pain Score 2     Pain Location Throat    Pain Descriptors / Indicators Sore    Pain Type Acute pain    Pain Onset Efrain Sella Adult PT Treatment/Exercise - 10/15/19 0001      Exercises   Exercises Neck      Neck Exercises: Seated   Other Seated Exercise postural exercise x 5    Other Seated Exercise scapular retraction. B ER       Neck Exercises: Supine   Neck Retraction 5 reps    Neck Retraction Limitations scapular retraction x 5       Manual Therapy   Manual Therapy Soft tissue mobilization;Manual Lymphatic Drainage (MLD);Compression Bandaging    Manual therapy comments done seperate from all other aspects of treatment     Soft tissue mobilization for tight anterior neck mm     Manual Lymphatic Drainage (MLD) for chest, neck  and head                    PT Short Term Goals - 10/07/19 1616      PT SHORT TERM GOAL #1   Title PT will be I in self manual techniques to assist in decreasing volume from cervical area to improve swallowing .    Time 2    Period Weeks    Status Achieved    Target Date 10/14/19             PT Long Term Goals - 10/07/19 1617      PT LONG TERM GOAL #1   Title PT  to be I in all self manual steps for cervical lymphedema to decrease edema to allow improved swallowing.    Time 4    Period Weeks    Status On-going      PT LONG TERM GOAL #2   Title PT to have at least 60 degrees cervical rotation to allow safer driving    Time 4    Period Weeks    Status On-going      PT LONG TERM GOAL #3   Title PT to have lost at least .8 cm at  cervical sites which have been measured.    Time 4    Period Weeks    Status On-going                 Plan - 10/15/19 1130  Clinical Impression Statement Pt late for appointment.  Began with pt completing self manual with verbal and tactile cuing from therapist for both chest and neck.  Therapist stressed the importance of posture for improved swallowing as well as lymphatic circulation. Therapist completed chest, neck and facial decongestive techniqes.    Personal Factors and Comorbidities Comorbidity 1;Fitness    Examination-Activity Limitations Other   swallowing / self image   Examination-Participation Restrictions Other   swallowing   Stability/Clinical Decision Making Evolving/Moderate complexity    Rehab Potential Good    PT Frequency 3x / week    PT Duration 4 weeks    PT Treatment/Interventions Patient/family education;Manual lymph drainage;Compression bandaging    PT Next Visit Plan , continue to  educate in cervical aspect to decongestive techniques as wife will not be able to be active in the instruction.  Measure next week    PT Home Exercise Plan 1-8 decongestive techniques    Consulted and Agree with Plan of Care Patient           Patient will benefit from skilled therapeutic intervention in order to improve the following deficits and impairments:  Increased fascial restricitons, Increased muscle spasms, Decreased range of motion, Increased edema  Visit Diagnosis: Lymphedema, not elsewhere classified     Problem List Patient Active Problem List   Diagnosis Date Noted  . PEG (percutaneous endoscopic gastrostomy) adjustment/replacement/removal ([place]) 08/11/2019  . PEG tube malfunction ([place]) 08/10/2019  . HCAP (healthcare-associated pneumonia) 04/10/2019  . Febrile illness, acute 04/08/2019  . Malnutrition of moderate degree 04/03/2019  . GI bleed 04/01/2019  . Dehydration 03/28/2019  . RLL pneumonia 03/17/2019  . Hx of tracheostomy 02/18/2019  . Head  and neck cancer ([place]) 02/18/2019  . 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 Stroke ([place]) 07/31/2018  . Rotator cuff syndrome of right shoulder 02/20/2013  . Bradycardia 11/06/2012  . Coronary artery disease   . Abnormal finding on cardiovascular stress test 09/25/2012  . Hypertension 09/03/2012  . Chest pain 09/03/2012  . Alcohol abuse, daily use 09/03/2012  . Hyperlipidemia 09/03/2012  . Hyponatremia 09/03/2012    Rayetta Humphrey, PT CLT [phone] 10/15/2019, 11:33 AM  [facility] [address] [place], [place], [address] Phone: [phone]   Fax:  [phone]  Name: [name] MRN: [id number] Date of Birth: 05-04-1960

## 2019-10-17 ENCOUNTER — Other Ambulatory Visit: Payer: Self-pay

## 2019-10-17 ENCOUNTER — Ambulatory Visit (HOSPITAL_COMMUNITY): Payer: 59 | Admitting: Physical Therapy

## 2019-10-17 DIAGNOSIS — I890 Lymphedema, not elsewhere classified: Secondary | ICD-10-CM | POA: Diagnosis not present

## 2019-10-17 NOTE — Therapy (Signed)
[facility] [place], [place], [address] Phone: [phone]   Fax:  [phone]  Physical Therapy Treatment  Patient Details  Name: [name] MRN: [id number] Date of Birth: [birth year] Referring Provider (PT): Eppie Gibson   Encounter Date: 10/17/2019   PT End of Session - 10/17/19 1314    Visit Number 6    Number of Visits 12    Date for PT Re-Evaluation 10/30/19    Authorization Type [employer] 30 limit combined    Progress Note Due on Visit 10    PT Start Time 1145    PT Stop Time 1215    PT Time Calculation (min) 30 min    Activity Tolerance Patient tolerated treatment well    Behavior During Therapy [facility] for tasks assessed/performed           Past Medical History:  Diagnosis Date  . Anxiety   . Arthritis    back  . Bradycardia    a. During 11/2012 adm: lopressor decreased.  . Cancer of larynx ([place])   . Carotid artery stenosis   . 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 Coronary artery disease    a. Diag cath 10/2012 for CP/abnormal nuc -> planned PCI s/p LAD atherectomy/DES placement 11/05/12.  . Dilated aortic root ([place])   . GERD (gastroesophageal reflux disease)   . History of hiatal hernia   . Hypercholesteremia   . Hypertension   . PEG tube malfunction ([place])   . Stroke ([place]) 07/31/2018   bilateral vertebrobasilar occlusion; "left side weaker thanit was before."    Past Surgical History:  Procedure Laterality Date  . CARDIAC CATHETERIZATION  10/29/2012  . CORONARY ANGIOPLASTY WITH STENT PLACEMENT  11/05/2012  . DIRECT LARYNGOSCOPY N/A 01/02/2019   Procedure: MICRO DIRECT LARYNGOSCOPY BIOPSY OF LARYNGEAL MASS;  Surgeon: [name], MD;  [facility];  Service: ENT;  Laterality: N/A;  . ESOPHAGOGASTRODUODENOSCOPY (EGD) WITH PROPOFOL N/A 08/11/2019   Procedure: ATTEMPTED ESOPHAGOGASTRODUODENOSCOPY (EGD) WITH PROPOFOL  (UNABLE TO PERFORM PERCUTANEOUS ENDOSCOPIC GASTROSTOMY TUBE REPLACEMENT);  Surgeon: [name], MD;  [facility];  Service: Gastroenterology;  Laterality: N/A;  . HEMORRHOID SURGERY  ~ 2010  . INSERTION OF MESH N/A 06/02/2015   Procedure: INSERTION OF MESH;  Surgeon: [name], MD;  [facility];  Service: General;  Laterality: N/A;  . IR ANGIO INTRA EXTRACRAN SEL COM CAROTID INNOMINATE BILAT MOD SED  08/02/2018  . IR ANGIO VERTEBRAL SEL VERTEBRAL BILAT MOD SED  08/02/2018  . IR REPLACE G-TUBE SIMPLE WO FLUORO  08/12/2019  . IR REPLC GASTRO/COLONIC TUBE PERCUT W/FLUORO  08/13/2019  . LAPAROSCOPIC INSERTION GASTROSTOMY TUBE N/A 02/24/2019   Procedure: LAPAROSCOPIC INSERTION GASTROSTOMY TUBE;  Surgeon: [name], MD;  Location: [facility];  Service: General;  Laterality: N/A;  . LEFT HEART CATHETERIZATION WITH CORONARY ANGIOGRAM N/A 10/30/2012   Procedure: LEFT HEART CATHETERIZATION WITH CORONARY ANGIOGRAM;  Surgeon: [name], MD;  Location: [facility] CATH LAB;  Service: Cardiovascular;  Laterality: N/A;  . MULTIPLE EXTRACTIONS WITH ALVEOLOPLASTY N/A 02/18/2019   Procedure: Extraction of tooth #'s 5-7, 10,11,14,20-29, 31 and 32 with alveoloplasty and maxiillary left buccal exostoses reductions;  Surgeon: [name], [name];  Location: [facility];  Service: Oral Surgery;  Laterality: N/A;  . PERCUTANEOUS CORONARY ROTOBLATOR INTERVENTION (PCI-R) N/A 11/05/2012   Procedure: PERCUTANEOUS CORONARY ROTOBLATOR INTERVENTION (PCI-R);  Surgeon:  [name], MD;  Location: [facility] CATH LAB;  Service: Cardiovascular;  Laterality: N/A;  . TRACHEOSTOMY TUBE PLACEMENT N/A 02/18/2019   Procedure: Tracheostomy;  Surgeon: [name], MD;  Location: [facility];  Service: ENT;  Laterality: N/A;  . UMBILICAL HERNIA REPAIR N/A 06/02/2015   Procedure: UMBILICAL HERNIORRHAPHY WITH MESH;  Surgeon: [name], MD;  [facility];  Service: General;  Laterality: N/A;    There were no vitals filed for this visit.    Subjective Assessment - 10/17/19 1312    Subjective Pt states that he slept on his neck wrong and is having some cervical pain.    Currently in Pain? Yes    Pain Score 3     Pain Location Neck    Pain Orientation Left    Pain Descriptors / Indicators Aching    Pain Type Acute pain    Pain Onset Yesterday    Aggravating Factors  turning his head    Pain Relieving Factors not sure    Effect of Pain on Daily Activities none                             [facility] Adult PT Treatment/Exercise - 10/17/19 0001      Exercises   Exercises Neck      Neck Exercises: Seated   Other Seated Exercise postural exercise x 5    Other Seated Exercise scapular retraction. B ER       Neck Exercises: Supine   Neck Retraction 5 reps    Neck Retraction Limitations scapular retraction x 5     Cervical Rotation Both;5 reps    Cervical Rotation Limitations passive     Lateral Flexion Both;5 reps    Lateral Flexion Limitations passive stretch       Manual Therapy   Manual Therapy Soft tissue mobilization;Manual Lymphatic Drainage (MLD);Compression Bandaging    Manual therapy comments done seperate from all other aspects of treatment     Soft tissue mobilization for tight anterior neck mm     Manual Lymphatic Drainage (MLD) for chest, neck  and head                    PT Short Term Goals - 10/07/19 1616      PT SHORT TERM GOAL #1   Title PT will be I in self manual techniques to assist in decreasing volume from cervical area to improve swallowing .    Time 2    Period Weeks    Status Achieved    Target Date 10/14/19             PT Long Term Goals - 10/07/19 1617      PT LONG TERM GOAL #1   Title PT  to be I in all self manual steps for cervical lymphedema to decrease edema to allow improved swallowing.    Time 4    Period Weeks    Status On-going      PT LONG TERM GOAL #2   Title PT to have at least 60 degrees cervical rotation to allow safer driving    Time 4     Period Weeks    Status On-going      PT LONG TERM GOAL #3   Title PT to have lost at least .8 cm at cervical sites which have been measured.    Time 4    Period Weeks    Status  On-going                 Plan - 10/17/19 1314    Clinical Impression Statement Pt late for appointment.  Therapist added passive gentle stretching to address pt cervical pain.  Pt continues to have induration and scar tissue in anterior cervical region therefore extra time with manual was taken at this area.    Personal Factors and Comorbidities Comorbidity 1;Fitness    Examination-Activity Limitations Other   swallowing / self image   Examination-Participation Restrictions Other   swallowing   Stability/Clinical Decision Making Evolving/Moderate complexity    Rehab Potential Good    PT Frequency 3x / week    PT Duration 4 weeks    PT Treatment/Interventions Patient/family education;Manual lymph drainage;Compression bandaging    PT Next Visit Plan , continue to  educate in cervical aspect to decongestive techniques as wife will not be able to be active in the instruction.  Measure next Wed.    PT Home Exercise Plan 1-8 decongestive techniques    Consulted and Agree with Plan of Care Patient           Patient will benefit from skilled therapeutic intervention in order to improve the following deficits and impairments:  Increased fascial restricitons, Increased muscle spasms, Decreased range of motion, Increased edema  Visit Diagnosis: Lymphedema, not elsewhere classified     Problem List Patient Active Problem List   Diagnosis Date Noted  . PEG (percutaneous endoscopic gastrostomy) adjustment/replacement/removal ([place]) 08/11/2019  . PEG tube malfunction ([place]) 08/10/2019  . HCAP (healthcare-associated pneumonia) 04/10/2019  . Febrile illness, acute 04/08/2019  . Malnutrition of moderate degree 04/03/2019  . GI bleed 04/01/2019  . Dehydration 03/28/2019  . RLL pneumonia 03/17/2019  . Hx  of tracheostomy 02/18/2019  . Head and neck cancer ([place]) 02/18/2019  . 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 Stroke ([place]) 07/31/2018  . Rotator cuff syndrome of right shoulder 02/20/2013  . Bradycardia 11/06/2012  . Coronary artery disease   . Abnormal finding on cardiovascular stress test 09/25/2012  . Hypertension 09/03/2012  . Chest pain 09/03/2012  . Alcohol abuse, daily use 09/03/2012  . Hyperlipidemia 09/03/2012  . Hyponatremia 09/03/2012   Rayetta Humphrey, PT CLT [phone] 10/17/2019, 1:18 PM  [facility] [address] [place], [place], [address] Phone: [phone]   Fax:  [phone]  Name: OVIDE DUSEK MRN: [id number] Date of Birth: [birth year]

## 2019-10-20 ENCOUNTER — Ambulatory Visit (HOSPITAL_COMMUNITY)
Admission: RE | Admit: 2019-10-20 | Discharge: 2019-10-20 | Disposition: A | Discharge status: Home / Self Care | Payer: 59 | Source: Ambulatory Visit | Attending: Physician Assistant | Admitting: Physician Assistant

## 2019-10-20 ENCOUNTER — Other Ambulatory Visit: Payer: Self-pay

## 2019-10-20 DIAGNOSIS — I77810 Thoracic aortic ectasia: Secondary | ICD-10-CM | POA: Diagnosis not present

## 2019-10-20 LAB — POCT I-STAT CREATININE: Creatinine, Ser: 1 mg/dL (ref 0.61–1.24)

## 2019-10-20 MED ORDER — IOHEXOL 300 MG/ML  SOLN
100.0000 mL | Freq: Once | INTRAMUSCULAR | Status: AC | PRN
  Administered 2019-10-20: 100 mL via INTRAVENOUS

## 2019-10-21 ENCOUNTER — Telehealth: Payer: Self-pay | Admitting: *Deleted

## 2019-10-21 ENCOUNTER — Ambulatory Visit (HOSPITAL_COMMUNITY): Payer: 59 | Admitting: Speech Pathology

## 2019-10-21 ENCOUNTER — Ambulatory Visit (HOSPITAL_COMMUNITY): Payer: 59 | Admitting: Physical Therapy

## 2019-10-21 ENCOUNTER — Encounter (HOSPITAL_COMMUNITY): Payer: Self-pay

## 2019-10-21 DIAGNOSIS — I890 Lymphedema, not elsewhere classified: Secondary | ICD-10-CM | POA: Diagnosis not present

## 2019-10-21 DIAGNOSIS — I77810 Thoracic aortic ectasia: Secondary | ICD-10-CM

## 2019-10-21 NOTE — Telephone Encounter (Signed)
-----   Message from Charlie Pitter, [place] sent at 10/21/2019  2:19 PM EDT ----- Hi [facility] team, can you please contact oncology's office (Dr. Delton Coombes) to see if they can bring this result to the attention of the oncologist? Forwarded yesterday in [place] but would like to ensure a definite handoff given findings that may represent metastases. See previous result note. Thanks.

## 2019-10-21 NOTE — Therapy (Signed)
[place], [place], [address] Phone: [phone]   Fax:  [phone]  Physical Therapy Treatment  Patient Details  Name: [name] MRN: [id number] Date of Birth: Oct 07, 1960 Referring Provider (PT): Eppie Gibson   Encounter Date: 10/21/2019   PT End of Session - 10/21/19 1527    Visit Number 7    Number of Visits 12    Date for PT Re-Evaluation 10/30/19    Authorization Type [employer] 30 limit combined    Progress Note Due on Visit 10    PT Start Time 1450    PT Stop Time 1520    PT Time Calculation (min) 30 min    Activity Tolerance Patient tolerated treatment well    Behavior During Therapy [facility] for tasks assessed/performed           Past Medical History:  Diagnosis Date  . Anxiety   . Arthritis    back  . Bradycardia    a. During 11/2012 adm: lopressor decreased.  . Cancer of larynx ([place])   . Carotid artery stenosis   . 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 Coronary artery disease    a. Diag cath 10/2012 for CP/abnormal nuc -> planned PCI s/p LAD atherectomy/DES placement 11/05/12.  . Dilated aortic root ([place])   . GERD (gastroesophageal reflux disease)   . History of hiatal hernia   . Hypercholesteremia   . Hypertension   . PEG tube malfunction ([place])   . Stroke ([place]) 07/31/2018   bilateral vertebrobasilar occlusion; "left side weaker thanit was before."    Past Surgical History:  Procedure Laterality Date  . CARDIAC CATHETERIZATION  10/29/2012  . CORONARY ANGIOPLASTY WITH STENT PLACEMENT  11/05/2012  . DIRECT LARYNGOSCOPY N/A 01/02/2019   Procedure: MICRO DIRECT LARYNGOSCOPY BIOPSY OF LARYNGEAL MASS;  Surgeon: [name], MD;  [facility];  Service: ENT;  Laterality: N/A;  . ESOPHAGOGASTRODUODENOSCOPY (EGD) WITH PROPOFOL N/A 08/11/2019   Procedure: ATTEMPTED ESOPHAGOGASTRODUODENOSCOPY (EGD) WITH PROPOFOL  (UNABLE TO PERFORM PERCUTANEOUS ENDOSCOPIC GASTROSTOMY TUBE REPLACEMENT);  Surgeon: [name], MD;  [facility]: [facility];  Service: Gastroenterology;  Laterality: N/A;  . HEMORRHOID SURGERY  ~ 2010  . INSERTION OF MESH N/A 06/02/2015   Procedure: INSERTION OF MESH;  Surgeon: [name], MD;  Location: [facility];  Service: General;  Laterality: N/A;  . IR ANGIO INTRA EXTRACRAN SEL COM CAROTID INNOMINATE BILAT MOD SED  08/02/2018  . IR ANGIO VERTEBRAL SEL VERTEBRAL BILAT MOD SED  08/02/2018  . IR REPLACE G-TUBE SIMPLE WO FLUORO  08/12/2019  . IR REPLC GASTRO/COLONIC TUBE PERCUT W/FLUORO  08/13/2019  . LAPAROSCOPIC INSERTION GASTROSTOMY TUBE N/A 02/24/2019   Procedure: LAPAROSCOPIC INSERTION GASTROSTOMY TUBE;  Surgeon: [name], MD;  Location: [facility];  Service: General;  Laterality: N/A;  . LEFT HEART CATHETERIZATION WITH CORONARY ANGIOGRAM N/A 10/30/2012   Procedure: LEFT HEART CATHETERIZATION WITH CORONARY ANGIOGRAM;  Surgeon: [name], MD;  Location: [facility] CATH LAB;  Service: Cardiovascular;  Laterality: N/A;  . MULTIPLE EXTRACTIONS WITH ALVEOLOPLASTY N/A 02/18/2019   Procedure: Extraction of tooth #'s 5-7, 10,11,14,20-29, 31 and 32 with alveoloplasty and maxiillary left buccal exostoses reductions;  Surgeon: [name], [name];  Location: [facility];  Service: Oral Surgery;  Laterality: N/A;  . PERCUTANEOUS CORONARY ROTOBLATOR INTERVENTION (PCI-R) N/A 11/05/2012   Procedure: PERCUTANEOUS CORONARY ROTOBLATOR INTERVENTION (PCI-R);  Surgeon:  [name], MD;  Location: [facility] CATH LAB;  Service: Cardiovascular;  Laterality: N/A;  . TRACHEOSTOMY TUBE PLACEMENT N/A 02/18/2019   Procedure: Tracheostomy;  Surgeon: [name], MD;  Location: [facility];  Service: ENT;  Laterality: N/A;  . UMBILICAL HERNIA REPAIR N/A 06/02/2015   Procedure: UMBILICAL HERNIORRHAPHY WITH MESH;  Surgeon: [name], MD;  [facility];  Service: General;  Laterality: N/A;    There were no vitals filed for this visit.    Subjective Assessment - 10/21/19 1524    Subjective Pt states that he had all of his teeth pulled last March, except it was a failed surgery due to some of the teeth crumbling.  He has had more pain with  his gums since hs has sarted eating but the pain increased significantly Friday night.  He went to the MD Monday and he ordered an antibiotic, although,he has yet to fill it.  Statates that all his pain today is in his gums.    Currently in Pain? Yes    Pain Score 6     Pain Location Mouth    Pain Orientation Right;Left    Pain Descriptors / Indicators Stabbing;Sharp    Pain Type Acute pain    Pain Onset In the past 7 days    Pain Frequency Constant                             [facility] Adult PT Treatment/Exercise - 10/21/19 0001      Exercises   Exercises Neck      Neck Exercises: Seated   Other Seated Exercise postural exercise x 5    Other Seated Exercise scapular retraction. B ER       Neck Exercises: Supine   Neck Retraction 5 reps    Neck Retraction Limitations scapular retraction x 5     Cervical Rotation Both;5 reps    Cervical Rotation Limitations passive     Lateral Flexion Both;5 reps    Lateral Flexion Limitations passive stretch       Manual Therapy   Manual Therapy Soft tissue mobilization;Manual Lymphatic Drainage (MLD);Compression Bandaging    Manual therapy comments done seperate from all other aspects of treatment     Soft tissue mobilization for tight anterior neck mm     Manual Lymphatic Drainage (MLD) for chest, neck  and head                    PT Short Term Goals - 10/07/19 1616      PT SHORT TERM GOAL #1   Title PT will be I in self manual techniques to assist in decreasing volume from cervical area to improve swallowing .    Time 2    Period Weeks    Status Achieved    Target Date 10/14/19             PT Long Term Goals - 10/07/19 1617      PT LONG TERM GOAL #1   Title PT  to be I in all self manual steps for  cervical lymphedema to decrease edema to allow improved swallowing.    Time 4    Period Weeks    Status On-going      PT LONG TERM GOAL #2   Title PT to have at least 60 degrees cervical rotation to allow safer driving    Time 4    Period Weeks  Status On-going      PT LONG TERM GOAL #3   Title PT to have lost at least .8 cm at cervical sites which have been measured.    Time 4    Period Weeks    Status On-going                 Plan - 10/21/19 1528    Clinical Impression Statement Pt still having gum pain but has not started his antibiotcs.  PT with noted decreased throat edema but continues to have scar tissue and induration along anterior aspect of throat.    Personal Factors and Comorbidities Comorbidity 1;Fitness    Examination-Activity Limitations Other   swallowing / self image   Examination-Participation Restrictions Other   swallowing   Stability/Clinical Decision Making Evolving/Moderate complexity    Rehab Potential Good    PT Frequency 3x / week    PT Duration 4 weeks    PT Treatment/Interventions Patient/family education;Manual lymph drainage;Compression bandaging    PT Next Visit Plan measuce next treatment and check foam compression to see if it is fitting pt well.    PT Home Exercise Plan 1-8 decongestive techniques    Consulted and Agree with Plan of Care Patient           Patient will benefit from skilled therapeutic intervention in order to improve the following deficits and impairments:  Increased fascial restricitons, Increased muscle spasms, Decreased range of motion, Increased edema  Visit Diagnosis: Lymphedema, not elsewhere classified     Problem List Patient Active Problem List   Diagnosis Date Noted  . PEG (percutaneous endoscopic gastrostomy) adjustment/replacement/removal ([place]) 08/11/2019  . PEG tube malfunction ([place]) 08/10/2019  . HCAP (healthcare-associated pneumonia) 04/10/2019  . Febrile illness, acute 04/08/2019  .  Malnutrition of moderate degree 04/03/2019  . GI bleed 04/01/2019  . Dehydration 03/28/2019  . RLL pneumonia 03/17/2019  . Hx of tracheostomy 02/18/2019  . Head and neck cancer ([place]) 02/18/2019  . 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 Stroke ([place]) 07/31/2018  . Rotator cuff syndrome of right shoulder 02/20/2013  . Bradycardia 11/06/2012  . Coronary artery disease   . Abnormal finding on cardiovascular stress test 09/25/2012  . Hypertension 09/03/2012  . Chest pain 09/03/2012  . Alcohol abuse, daily use 09/03/2012  . Hyperlipidemia 09/03/2012  . Hyponatremia 09/03/2012   Rayetta Humphrey, PT CLT [phone] 10/21/2019, 3:30 PM  [facility] [address] [place], [place], [address] Phone: [phone]   Fax:  [phone]  Name: MCIHAEL HINDERMAN MRN: [id number] Date of Birth: 27-Feb-1960

## 2019-10-23 ENCOUNTER — Encounter (HOSPITAL_COMMUNITY): Payer: Self-pay | Admitting: Physical Therapy

## 2019-10-23 ENCOUNTER — Ambulatory Visit (HOSPITAL_COMMUNITY): Payer: 59 | Admitting: Physical Therapy

## 2019-10-23 ENCOUNTER — Encounter (HOSPITAL_COMMUNITY): Payer: Self-pay | Admitting: Speech Pathology

## 2019-10-23 ENCOUNTER — Other Ambulatory Visit: Payer: Self-pay

## 2019-10-23 ENCOUNTER — Ambulatory Visit (HOSPITAL_COMMUNITY): Payer: 59 | Admitting: Speech Pathology

## 2019-10-23 DIAGNOSIS — I890 Lymphedema, not elsewhere classified: Secondary | ICD-10-CM | POA: Diagnosis not present

## 2019-10-23 DIAGNOSIS — R1312 Dysphagia, oropharyngeal phase: Secondary | ICD-10-CM

## 2019-10-23 NOTE — Therapy (Signed)
[place], [place], [address] Phone: [phone]   Fax:  [phone]  Speech Language Pathology Treatment  Patient Details  Name: [name] MRN: [id number] Date of Birth: 08-17-1960 No data recorded  Encounter Date: 10/23/2019   End of Session - 10/23/19 1559    Visit Number 3    Number of Visits 8    Date for SLP Re-Evaluation 11/16/19    Authorization Type [employer]    SLP Start Time 1036    SLP Stop Time  1114    SLP Time Calculation (min) 38 min    Activity Tolerance Patient tolerated treatment well           Past Medical History:  Diagnosis Date  . Anxiety   . Arthritis    back  . Bradycardia    a. During 11/2012 adm: lopressor decreased.  . Cancer of larynx ([place])   . Carotid artery stenosis   . 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 Coronary artery disease    a. Diag cath 10/2012 for CP/abnormal nuc -> planned PCI s/p LAD atherectomy/DES placement 11/05/12.  . Dilated aortic root ([place])   . GERD (gastroesophageal reflux disease)   . History of hiatal hernia   . Hypercholesteremia   . Hypertension   . PEG tube malfunction ([place])   . Stroke ([place]) 07/31/2018   bilateral vertebrobasilar occlusion; "left side weaker thanit was before."    Past Surgical History:  Procedure Laterality Date  . CARDIAC CATHETERIZATION  10/29/2012  . CORONARY ANGIOPLASTY WITH STENT PLACEMENT  11/05/2012  . DIRECT LARYNGOSCOPY N/A 01/02/2019   Procedure: MICRO DIRECT LARYNGOSCOPY BIOPSY OF LARYNGEAL MASS;  Surgeon: [name], MD;  [facility];  Service: ENT;  Laterality: N/A;  . ESOPHAGOGASTRODUODENOSCOPY (EGD) WITH PROPOFOL N/A 08/11/2019   Procedure: ATTEMPTED ESOPHAGOGASTRODUODENOSCOPY (EGD) WITH PROPOFOL (UNABLE TO PERFORM PERCUTANEOUS ENDOSCOPIC GASTROSTOMY TUBE REPLACEMENT);  Surgeon: [name], MD;  [facility];   Service: Gastroenterology;  Laterality: N/A;  . HEMORRHOID SURGERY  ~ 2010  . INSERTION OF MESH N/A 06/02/2015   Procedure: INSERTION OF MESH;  Surgeon: [name], MD;  [facility];  Service: General;  Laterality: N/A;  . IR ANGIO INTRA EXTRACRAN SEL COM CAROTID INNOMINATE BILAT MOD SED  08/02/2018  . IR ANGIO VERTEBRAL SEL VERTEBRAL BILAT MOD SED  08/02/2018  . IR REPLACE G-TUBE SIMPLE WO FLUORO  08/12/2019  . IR REPLC GASTRO/COLONIC TUBE PERCUT W/FLUORO  08/13/2019  . LAPAROSCOPIC INSERTION GASTROSTOMY TUBE N/A 02/24/2019   Procedure: LAPAROSCOPIC INSERTION GASTROSTOMY TUBE;  Surgeon: [name], MD;  Location: [facility];  Service: General;  Laterality: N/A;  . LEFT HEART CATHETERIZATION WITH CORONARY ANGIOGRAM N/A 10/30/2012   Procedure: LEFT HEART CATHETERIZATION WITH CORONARY ANGIOGRAM;  Surgeon: [name], MD;  Location: [facility] CATH LAB;  Service: Cardiovascular;  Laterality: N/A;  . MULTIPLE EXTRACTIONS WITH ALVEOLOPLASTY N/A 02/18/2019   Procedure: Extraction of tooth #'s 5-7, 10,11,14,20-29, 31 and 32 with alveoloplasty and maxiillary left buccal exostoses reductions;  Surgeon: [name], [name];  Location: [facility];  Service: Oral Surgery;  Laterality: N/A;  . PERCUTANEOUS CORONARY ROTOBLATOR INTERVENTION (PCI-R) N/A 11/05/2012   Procedure: PERCUTANEOUS CORONARY ROTOBLATOR INTERVENTION (PCI-R);  Surgeon: [name], MD;  Location: [facility] CATH LAB;  Service: Cardiovascular;  Laterality: N/A;  . TRACHEOSTOMY TUBE PLACEMENT N/A 02/18/2019  Procedure: Tracheostomy;  Surgeon: [name], MD;  Location: [facility] OR;  Service: ENT;  Laterality: N/A;  . UMBILICAL HERNIA REPAIR N/A 06/02/2015   Procedure: UMBILICAL HERNIORRHAPHY WITH MESH;  Surgeon: [name], MD;  [facility];  Service: General;  Laterality: N/A;    There were no vitals filed for this visit.   Subjective Assessment - 10/23/19 1124    Subjective "My mouth was so sore."    Currently in Pain? No/denies                  ADULT SLP TREATMENT - 10/23/19 1125      General Information   Behavior/Cognition Alert;Cooperative;Pleasant mood    Patient Positioning Upright in chair    Oral care provided N/A    HPI [name] is a 59 yo male who was referred for a clinical swallow evaluation by Dr. 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      Treatment Provided   Treatment provided Dysphagia      Dysphagia Treatment   Temperature Spikes Noted No    Respiratory Status Room air    Oral Cavity - Dentition Edentulous    Treatment Methods Skilled observation;Therapeutic exercise;Compensation strategy training;Patient/caregiver education    Patient observed directly with PO's Yes    Type of PO's observed Thin liquids    Feeding Able to feed self    Liquids provided via Cup    Pharyngeal Phase Signs & Symptoms Suspected delayed swallow initiation;Delayed throat clear;Delayed cough    Type of cueing Verbal    Amount of cueing Minimal      Pain Assessment   Pain Assessment No/denies pain      Assessment / Recommendations / Plan   Plan Continue with current plan of care      Progression Toward Goals   Progression toward goals Progressing toward goals              SLP Short Term Goals - 10/23/19 1600      SLP SHORT TERM GOAL #1   Title Pt will demonstrate safe and efficient consumption of self regulated regular textures with thin liquids with use of strategies as needed.    Baseline Currently using 5-6 cans via tube feeds, some thin liquids and soft solids by mouth    Time 2    Period Months    Status On-going    Target Date 11/16/19      SLP SHORT TERM GOAL #2   Title Pt will complete pharyngeal swallowing exercises as assigned with use of written cue after initial introduction/model from SLP    Baseline Pt was previously given, has not been completing    Time 2    Period Months    Status On-going     Target Date 11/16/19      SLP SHORT TERM GOAL #3   Title Complete MBSS now that trach has been removed and Pt has been eating/drinking more by mouth    Baseline Last MBSS was 07/24/19 with trach in place    Time 2    Period Months    Status Achieved    Target Date 10/30/19            SLP Long Term Goals - 10/23/19 1601      SLP LONG TERM GOAL #1   Title Pt will consume self regulated regular textures and thin liquids with use of strategies as needed.  Plan - 10/23/19 1600    Clinical Impression Statement Pt seen for dysphagia therapy. He has not been consuming much by mouth lately due to mouth pain. He ate nachos with cheese. Suspect that taco chips were painful to gums. He has been using an oral salt rinse and indicates that it is starting to feel better. He was encouraged to call his doctor if the pain does not subside. Oral cavity appears WNL. He continues to take 5 cartons via PEG and limited oral intake He was asked to document all po intake to share with SLP and RD.  He was again instructed on swallowing exercises to implement, as he has not been completing (he did not bring his folder). He was instructed to complete CTAR (chin tuck against resistance and swallow), lingual and labial stretching, in addition to po trials. He is scheduled for EGD on 11/24/19. Recommend continued po trials of moist/soft solids/purees and thin liquids with use of strategies. SLP will continue to follow.    Speech Therapy Frequency 1x /week   1-2x per month after chemoradiation   Duration 8 weeks   3 months   Treatment/Interventions Aspiration precaution training;SLP instruction and feedback;Pharyngeal strengthening exercises;Compensatory strategies;Patient/family education;Compensatory techniques;Diet toleration management by SLP   MBSS   Potential to Achieve Goals Good    SLP Home Exercise Plan Pt will complete HEP as assigned to facilitate carryover of treatment strategies and techniques in  home environment with written cues    Consulted and Agree with Plan of Care Patient           Patient will benefit from skilled therapeutic intervention in order to improve the following deficits and impairments:   Dysphagia, oropharyngeal phase    Problem List Patient Active Problem List   Diagnosis Date Noted  . PEG (percutaneous endoscopic gastrostomy) adjustment/replacement/removal ([place]) 08/11/2019  . PEG tube malfunction ([place]) 08/10/2019  . HCAP (healthcare-associated pneumonia) 04/10/2019  . Febrile illness, acute 04/08/2019  . Malnutrition of moderate degree 04/03/2019  . GI bleed 04/01/2019  . Dehydration 03/28/2019  . RLL pneumonia 03/17/2019  . Hx of tracheostomy 02/18/2019  . Head and neck cancer ([place]) 02/18/2019  . 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 Stroke ([place]) 07/31/2018  . Rotator cuff syndrome of right shoulder 02/20/2013  . Bradycardia 11/06/2012  . Coronary artery disease   . Abnormal finding on cardiovascular stress test 09/25/2012  . Hypertension 09/03/2012  . Chest pain 09/03/2012  . Alcohol abuse, daily use 09/03/2012  . Hyperlipidemia 09/03/2012  . Hyponatremia 09/03/2012   Thank you,  Genene Churn, [place]  [facility] 10/23/2019, 4:02 PM  [facility] [address] [place], [place], [address] Phone: [phone]   Fax:  [phone]   Name: [name] MRN: [id number] Date of Birth: 12-01-1960

## 2019-10-23 NOTE — Therapy (Addendum)
[facility] [address] [place], [place], [address] Phone: [phone]   Fax:  [phone]  Physical Therapy Treatment  Patient Details  Name: ALDOUS HOUSEL MRN: [id number] Date of Birth: 30-Sep-1960 Referring Provider (PT): [place]  Visits from Start of Care: 8 Current functional level related to goals / functional outcomes: Improved ROM, decreased cervical swelling    Remaining deficits: Rt and anterior cervical edema    Education / Equipment: Self massage, use of compression and HEP Plan: Patient agrees to discharge.  Patient goals were met. Patient is being discharged due to meeting the stated rehab goals.  ?????     Encounter Date: 10/23/2019   PT End of Session - 10/23/19 1211    Visit Number 8    Number of Visits 8   Date for PT Re-Evaluation 10/30/19    Authorization Type [employer] 30 limit combined    Progress Note Due on Visit 10    PT Start Time 1000   PT Stop Time 1035    PT Time Calculation (min) 27 min    Activity Tolerance Patient tolerated treatment well    Behavior During Therapy WFL for tasks assessed/performed           Past Medical History:  Diagnosis Date  . Anxiety   . Arthritis    back  . Bradycardia    a. During 11/2012 adm: lopressor decreased.  . Cancer of larynx ([place])   . Carotid artery stenosis   . 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 Coronary artery disease    a. Diag cath 10/2012 for CP/abnormal nuc -> planned PCI s/p LAD atherectomy/DES placement 11/05/12.  . Dilated aortic root ([place])   . GERD (gastroesophageal reflux disease)   . History of hiatal hernia   . Hypercholesteremia   . Hypertension   . PEG tube malfunction ([place])   . Stroke ([place]) 07/31/2018   bilateral vertebrobasilar occlusion; "left side weaker thanit was before."    Past Surgical History:   Procedure Laterality Date  . CARDIAC CATHETERIZATION  10/29/2012  . CORONARY ANGIOPLASTY WITH STENT PLACEMENT  11/05/2012  . DIRECT LARYNGOSCOPY N/A 01/02/2019   Procedure: MICRO DIRECT LARYNGOSCOPY BIOPSY OF LARYNGEAL MASS;  Surgeon: [name], MD;  [facility];  Service: ENT;  Laterality: N/A;  . ESOPHAGOGASTRODUODENOSCOPY (EGD) WITH PROPOFOL N/A 08/11/2019   Procedure: ATTEMPTED ESOPHAGOGASTRODUODENOSCOPY (EGD) WITH PROPOFOL (UNABLE TO PERFORM PERCUTANEOUS ENDOSCOPIC GASTROSTOMY TUBE REPLACEMENT);  Surgeon: [name], MD;  Location: [facility];  Service: Gastroenterology;  Laterality: N/A;  . HEMORRHOID SURGERY  ~ 2010  . INSERTION OF MESH N/A 06/02/2015   Procedure: INSERTION OF MESH;  Surgeon: [name], MD;  Location: [facility];  Service: General;  Laterality: N/A;  . IR ANGIO INTRA EXTRACRAN SEL COM CAROTID INNOMINATE BILAT MOD SED  08/02/2018  . IR ANGIO VERTEBRAL SEL VERTEBRAL BILAT MOD SED  08/02/2018  . IR REPLACE G-TUBE SIMPLE WO FLUORO  08/12/2019  . IR REPLC GASTRO/COLONIC TUBE PERCUT W/FLUORO  08/13/2019  . LAPAROSCOPIC INSERTION GASTROSTOMY TUBE N/A 02/24/2019   Procedure: LAPAROSCOPIC INSERTION GASTROSTOMY TUBE;  Surgeon: [name], MD;  Location: [facility];  Service: General;  Laterality: N/A;  . LEFT HEART CATHETERIZATION WITH CORONARY ANGIOGRAM N/A 10/30/2012   Procedure: LEFT HEART CATHETERIZATION WITH CORONARY ANGIOGRAM;  Surgeon: [name], MD;  Location: [facility] CATH  LAB;  Service: Cardiovascular;  Laterality: N/A;  . MULTIPLE EXTRACTIONS WITH ALVEOLOPLASTY N/A 02/18/2019   Procedure: Extraction of tooth #'s 5-7, 10,11,14,20-29, 31 and 32 with alveoloplasty and maxiillary left buccal exostoses reductions;  Surgeon: Charlynne Pander, [name];  [facility];  Service: Oral Surgery;  Laterality: N/A;  . PERCUTANEOUS CORONARY ROTOBLATOR INTERVENTION (PCI-R) N/A 11/05/2012   Procedure: PERCUTANEOUS CORONARY ROTOBLATOR INTERVENTION (PCI-R);  Surgeon: Iran Ouch, MD;   Location: [facility] CATH LAB;  Service: Cardiovascular;  Laterality: N/A;  . TRACHEOSTOMY TUBE PLACEMENT N/A 02/18/2019   Procedure: Tracheostomy;  Surgeon: Newman Pies, MD;  [facility];  Service: ENT;  Laterality: N/A;  . UMBILICAL HERNIA REPAIR N/A 06/02/2015   Procedure: UMBILICAL HERNIORRHAPHY WITH MESH;  Surgeon: Franky Macho, MD;  Location: [facility];  Service: General;  Laterality: N/A;    There were no vitals filed for this visit.   Subjective Assessment - 10/23/19 0958    Subjective Pt has no new complaints, he is having no pain with his teeth today    Pain Score 0-No pain    Pain Onset In the past 7 days              [facility] PT Assessment - 10/23/19 0001      Assessment   Medical Diagnosis cervical lymphedema     Referring Provider (PT) Lonie Peak    Onset Date/Surgical Date 05/03/19    Prior Therapy none       Precautions   Precautions None      Restrictions   Weight Bearing Restrictions No      Prior Function   Level of Independence Independent      Cognition   Overall Cognitive Status Within Functional Limits for tasks assessed      AROM   Cervical - Right Side Bend 25   was 18   Cervical - Left Side Bend 23   was 18   Cervical - Right Rotation 70   was 55   Cervical - Left Rotation 62   was 55             LYMPHEDEMA/ONCOLOGY QUESTIONNAIRE - 10/23/19 0001      Head and Neck   Right Lateral Nostril at base of nose to medial tragus  8.6 cm    Left Lateral Nostril at base of nose to medial tragus  8.2 cm    Right Corner of mouth to where ear lobe meets face 8.2 cm    Left Corner of mouth to where ear lobe meets face 8.1 cm    4 cm superior to sternal notch around neck 38.3 cm    6 cm superior to sternal notch around neck 40.8 cm                      [facility] Adult PT Treatment/Exercise - 10/23/19 0001      Exercises   Exercises Neck      Neck Exercises: Seated   Other Seated Exercise --    Other Seated Exercise --      Neck Exercises:  Supine   Neck Retraction --    Neck Retraction Limitations --    Cervical Rotation --    Cervical Rotation Limitations --    Lateral Flexion --    Lateral Flexion Limitations --      Manual Therapy   Manual Therapy Soft tissue mobilization;Manual Lymphatic Drainage (MLD);Compression Bandaging    Manual therapy comments done seperate from all other  aspects of treatment     Soft tissue mobilization for tight anterior neck mm     Manual Lymphatic Drainage (MLD) for chest, neck  and head                    PT Short Term Goals - 10/07/19 1616      PT SHORT TERM GOAL #1   Title PT will be I in self manual techniques to assist in decreasing volume from cervical area to improve swallowing .    Time 2    Period Weeks    Status Achieved    Target Date 10/14/19             PT Long Term Goals - 10/23/19 1030      PT LONG TERM GOAL #1   Title PT  to be I in all self manual steps for cervical lymphedema to decrease edema to allow improved swallowing.    Time 4    Period Weeks    Status Achieved      PT LONG TERM GOAL #2   Title PT to have at least 60 degrees cervical rotation to allow safer driving    Time 4    Period Weeks    Status Achieved      PT LONG TERM GOAL #3   Title PT to have lost at least .8 cm at cervical sites which have been measured.    Time 4    Period Weeks    Status Achieved                 Plan - 10/23/19 1214    Clinical Impression Statement Pt reassessed today with good improvement in cervical edema as well as cervical ROM.  Pt is pleased with results and is ready to be discharged.  Pt forgot to bring his compression in to be checked by therapist.    Personal Factors and Comorbidities Comorbidity 1;Fitness    Examination-Activity Limitations Other   swallowing / self image   Examination-Participation Restrictions Other   swallowing   Stability/Clinical Decision Making Evolving/Moderate complexity    Rehab Potential Good    PT  Frequency 3x / week    PT Duration 4 weeks    PT Treatment/Interventions Patient/family education;Manual lymph drainage;Compression bandaging    PT Next Visit Plan Discharge.    PT Home Exercise Plan 1-8 decongestive techniques; all techniques to include cervical and facial; Cervial ROM exercises    Consulted and Agree with Plan of Care Patient           Patient will benefit from skilled therapeutic intervention in order to improve the following deficits and impairments:  Increased fascial restricitons, Increased muscle spasms, Decreased range of motion, Increased edema  Visit Diagnosis: Lymphedema, not elsewhere classified     Problem List Patient Active Problem List   Diagnosis Date Noted  . PEG (percutaneous endoscopic gastrostomy) adjustment/replacement/removal ([place]) 08/11/2019  . PEG tube malfunction ([place]) 08/10/2019  . HCAP (healthcare-associated pneumonia) 04/10/2019  . Febrile illness, acute 04/08/2019  . Malnutrition of moderate degree 04/03/2019  . GI bleed 04/01/2019  . Dehydration 03/28/2019  . RLL pneumonia 03/17/2019  . Hx of tracheostomy 02/18/2019  . Head and neck cancer ([place]) 02/18/2019  . 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 Stroke ([place]) 07/31/2018  . Rotator cuff syndrome of right shoulder 02/20/2013  . Bradycardia 11/06/2012  . Coronary artery disease   . Abnormal finding on cardiovascular stress test 09/25/2012  . Hypertension  09/03/2012  . Chest pain 09/03/2012  . Alcohol abuse, daily use 09/03/2012  . Hyperlipidemia 09/03/2012  . Hyponatremia 09/03/2012    Rayetta Humphrey, PT CLT [phone] 10/23/2019, 12:17 PM  [facility] [address] [place], [place], [address] Phone: [phone]   Fax:  [phone]  Name: OSHAY STRANAHAN MRN: [id number] Date of Birth: [birth year]

## 2019-10-24 ENCOUNTER — Ambulatory Visit (HOSPITAL_COMMUNITY)
Admission: RE | Admit: 2019-10-24 | Discharge: 2019-10-24 | Disposition: A | Discharge status: Home / Self Care | Payer: 59 | Source: Ambulatory Visit | Attending: Physician Assistant | Admitting: Physician Assistant

## 2019-10-24 DIAGNOSIS — I361 Nonrheumatic tricuspid (valve) insufficiency: Secondary | ICD-10-CM

## 2019-10-24 DIAGNOSIS — I351 Nonrheumatic aortic (valve) insufficiency: Secondary | ICD-10-CM

## 2019-10-24 DIAGNOSIS — I77810 Thoracic aortic ectasia: Secondary | ICD-10-CM | POA: Diagnosis not present

## 2019-10-24 LAB — ECHOCARDIOGRAM COMPLETE
Area-P 1/2: 1.79 cm2
S' Lateral: 2.91 cm

## 2019-10-24 NOTE — Progress Notes (Signed)
*  PRELIMINARY RESULTS* Echocardiogram 2D Echocardiogram has been performed.  [name] 10/24/2019, 4:06 PM

## 2019-10-28 ENCOUNTER — Telehealth (HOSPITAL_COMMUNITY): Payer: Self-pay | Admitting: Speech Pathology

## 2019-10-28 ENCOUNTER — Ambulatory Visit (HOSPITAL_COMMUNITY): Payer: 59 | Admitting: Speech Pathology

## 2019-10-28 ENCOUNTER — Telehealth: Payer: Self-pay

## 2019-10-28 NOTE — Telephone Encounter (Signed)
pt's wife called to cx this appt due to her husband felt nauseus

## 2019-10-28 NOTE — Telephone Encounter (Signed)
Contacted patient to verify for pre reg

## 2019-10-29 ENCOUNTER — Other Ambulatory Visit: Payer: Self-pay

## 2019-10-29 ENCOUNTER — Telehealth (HOSPITAL_COMMUNITY): Payer: Self-pay

## 2019-10-29 ENCOUNTER — Ambulatory Visit (HOSPITAL_COMMUNITY): Payer: 59 | Admitting: Speech Pathology

## 2019-10-29 ENCOUNTER — Encounter (HOSPITAL_COMMUNITY): Payer: Self-pay | Admitting: Speech Pathology

## 2019-10-29 ENCOUNTER — Inpatient Hospital Stay: Payer: 59 | Attending: Radiation Oncology

## 2019-10-29 DIAGNOSIS — I890 Lymphedema, not elsewhere classified: Secondary | ICD-10-CM | POA: Diagnosis not present

## 2019-10-29 DIAGNOSIS — R1312 Dysphagia, oropharyngeal phase: Secondary | ICD-10-CM

## 2019-10-29 NOTE — Therapy (Signed)
[facility] [place], [place], [address] Phone: [phone]   Fax:  [phone]  Speech Language Pathology Treatment  Patient Details  Name: [name] MRN: [id number] Date of Birth: 13-Mar-1960 No data recorded  Encounter Date: 10/29/2019   End of Session - 10/29/19 1242    Visit Number 4    Number of Visits 8    Date for SLP Re-Evaluation 11/16/19    Authorization Type [employer]    SLP Start Time 1037    SLP Stop Time  1121    SLP Time Calculation (min) 44 min    Activity Tolerance Patient tolerated treatment well           Past Medical History:  Diagnosis Date  . Anxiety   . Arthritis    back  . Bradycardia    a. During 11/2012 adm: lopressor decreased.  . Cancer of larynx ([place])   . Carotid artery stenosis   . 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 Coronary artery disease    a. Diag cath 10/2012 for CP/abnormal nuc -> planned PCI s/p LAD atherectomy/DES placement 11/05/12.  . Dilated aortic root ([place])   . GERD (gastroesophageal reflux disease)   . History of hiatal hernia   . Hypercholesteremia   . Hypertension   . PEG tube malfunction ([place])   . Stroke ([place]) 07/31/2018   bilateral vertebrobasilar occlusion; "left side weaker thanit was before."    Past Surgical History:  Procedure Laterality Date  . CARDIAC CATHETERIZATION  10/29/2012  . CORONARY ANGIOPLASTY WITH STENT PLACEMENT  11/05/2012  . DIRECT LARYNGOSCOPY N/A 01/02/2019   Procedure: MICRO DIRECT LARYNGOSCOPY BIOPSY OF LARYNGEAL MASS;  Surgeon: [name], MD;  [facility];  Service: ENT;  Laterality: N/A;  . ESOPHAGOGASTRODUODENOSCOPY (EGD) WITH PROPOFOL N/A 08/11/2019   Procedure: ATTEMPTED ESOPHAGOGASTRODUODENOSCOPY (EGD) WITH PROPOFOL (UNABLE TO PERFORM PERCUTANEOUS ENDOSCOPIC GASTROSTOMY TUBE REPLACEMENT);  Surgeon: [name], MD;  Location: [facility];   Service: Gastroenterology;  Laterality: N/A;  . HEMORRHOID SURGERY  ~ 2010  . INSERTION OF MESH N/A 06/02/2015   Procedure: INSERTION OF MESH;  Surgeon: [name], MD;  Location: [facility];  Service: General;  Laterality: N/A;  . IR ANGIO INTRA EXTRACRAN SEL COM CAROTID INNOMINATE BILAT MOD SED  08/02/2018  . IR ANGIO VERTEBRAL SEL VERTEBRAL BILAT MOD SED  08/02/2018  . IR REPLACE G-TUBE SIMPLE WO FLUORO  08/12/2019  . IR REPLC GASTRO/COLONIC TUBE PERCUT W/FLUORO  08/13/2019  . LAPAROSCOPIC INSERTION GASTROSTOMY TUBE N/A 02/24/2019   Procedure: LAPAROSCOPIC INSERTION GASTROSTOMY TUBE;  Surgeon: [name], MD;  Location: [facility];  Service: General;  Laterality: N/A;  . LEFT HEART CATHETERIZATION WITH CORONARY ANGIOGRAM N/A 10/30/2012   Procedure: LEFT HEART CATHETERIZATION WITH CORONARY ANGIOGRAM;  Surgeon: [name], MD;  Location: [facility] CATH LAB;  Service: Cardiovascular;  Laterality: N/A;  . MULTIPLE EXTRACTIONS WITH ALVEOLOPLASTY N/A 02/18/2019   Procedure: Extraction of tooth #'s 5-7, 10,11,14,20-29, 31 and 32 with alveoloplasty and maxiillary left buccal exostoses reductions;  Surgeon: [name], [name];  Location: [facility];  Service: Oral Surgery;  Laterality: N/A;  . PERCUTANEOUS CORONARY ROTOBLATOR INTERVENTION (PCI-R) N/A 11/05/2012   Procedure: PERCUTANEOUS CORONARY ROTOBLATOR INTERVENTION (PCI-R);  Surgeon: [name], MD;  Location: [facility] CATH LAB;  Service: Cardiovascular;  Laterality: N/A;  . TRACHEOSTOMY TUBE PLACEMENT N/A 02/18/2019  Procedure: Tracheostomy;  Surgeon: [name], MD;  Location: [facility] OR;  Service: ENT;  Laterality: N/A;  . UMBILICAL HERNIA REPAIR N/A 06/02/2015   Procedure: UMBILICAL HERNIORRHAPHY WITH MESH;  Surgeon: [name], MD;  Location: [facility]S;  Service: General;  Laterality: N/A;    There were no vitals filed for this visit.   Subjective Assessment - 10/29/19 1105    Subjective "I have been eating a lot more."    Currently in Pain? No/denies             ADULT SLP TREATMENT - 10/29/19 1106      General Information   Behavior/Cognition Alert;Cooperative;Pleasant mood    Patient Positioning Upright in chair    Oral care provided N/A    HPI [name] is a 59 yo male who was referred for a clinical swallow evaluation by Dr. 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      Treatment Provided   Treatment provided Dysphagia      Dysphagia Treatment   Temperature Spikes Noted No    Respiratory Status Room air    Oral Cavity - Dentition Edentulous    Treatment Methods Skilled observation;Therapeutic exercise;Compensation strategy training;Patient/caregiver education    Patient observed directly with PO's Yes    Type of PO's observed Thin liquids    Feeding Able to feed self    Liquids provided via Cup    Oral Phase Signs & Symptoms Oral holding    Pharyngeal Phase Signs & Symptoms Suspected delayed swallow initiation;Delayed throat clear;Delayed cough    Type of cueing Verbal    Amount of cueing Minimal      Pain Assessment   Pain Assessment No/denies pain      Assessment / Recommendations / Plan   Plan Continue with current plan of care      Dysphagia Recommendations   Diet recommendations Dysphagia 3 (mechanical soft);Thin liquid    Liquids provided via Cup;No straw    Medication Administration Crushed with puree    Supervision Patient able to self feed    Compensations Small sips/bites;Multiple dry swallows after each bite/sip;Follow solids with liquid    Postural Changes and/or Swallow Maneuvers Seated upright 90 degrees;Upright 30-60 min after meal;Head turn right during swallow      General Recommendations   Oral Care Recommendations Oral care BID;Oral care prior to ice chips      Progression Toward Goals   Progression toward goals Progressing toward goals            SLP Education - 10/29/19 1109    Education Details continue with  good oral care, record po intake on log and discuss decreasing TFs with RD, one more visit before EGD    Person(s) Educated Patient    Methods Explanation    Comprehension Verbalized understanding            SLP Short Term Goals - 10/29/19 1243      SLP SHORT TERM GOAL #1   Title Pt will demonstrate safe and efficient consumption of self regulated regular textures with thin liquids with use of strategies as needed.    Baseline Currently using 5-6 cans via tube feeds, some thin liquids and soft solids by mouth    Time 2    Period Months    Status On-going    Target Date 11/16/19      SLP SHORT TERM GOAL #2   Title Pt will complete pharyngeal swallowing  exercises as assigned with use of written cue after initial introduction/model from SLP    Baseline Pt was previously given, has not been completing    Time 2    Period Months    Status On-going    Target Date 11/16/19      SLP SHORT TERM GOAL #3   Title Complete MBSS now that trach has been removed and Pt has been eating/drinking more by mouth    Baseline Last MBSS was 07/24/19 with trach in place    Time 2    Period Months    Status Achieved    Target Date 10/30/19            SLP Long Term Goals - 10/29/19 1243      SLP LONG TERM GOAL #1   Title Pt will consume self regulated regular textures and thin liquids with use of strategies as needed.            Plan - 10/29/19 1242    Clinical Impression Statement Pt seen for dysphagia therapy. He indicates that he is eating "much more" by mouth (bananas, beef jerky, oatmeal cookies, popcorn, scrambled eggs, sausage, meatloaf, Coco Puffs, Mt. Dew, potted meat, and chocolate milk). He reports that he decreased to 4 cartons via PEG for the past two weeks. He was encouraged to discuss reducing his tube feeds with RD. He weighed 159 pounds on our scale today. He was asked to document all po intake to share with SLP and RD.  He was again instructed on swallowing exercises to  implement, as he has not been completing (he did not bring his folder). He was instructed to complete CTAR (chin tuck against resistance and swallow), lingual and labial stretching, in addition to po trials. He is scheduled for EGD on 11/24/19. Recommend continued po trials of moist/soft solids/purees and thin liquids with use of strategies. SLP will continue to follow.    Speech Therapy Frequency 1x /week   1-2x per month after chemoradiation   Duration --   3 months   Treatment/Interventions Aspiration precaution training;SLP instruction and feedback;Pharyngeal strengthening exercises;Compensatory strategies;Patient/family education;Compensatory techniques;Diet toleration management by SLP   MBSS   Potential to Achieve Goals Good    SLP Home Exercise Plan Pt will complete HEP as assigned to facilitate carryover of treatment strategies and techniques in home environment with written cues    Consulted and Agree with Plan of Care Patient           Patient will benefit from skilled therapeutic intervention in order to improve the following deficits and impairments:   Dysphagia, oropharyngeal phase    Problem List Patient Active Problem List   Diagnosis Date Noted  . PEG (percutaneous endoscopic gastrostomy) adjustment/replacement/removal ([place]) 08/11/2019  . PEG tube malfunction ([place]) 08/10/2019  . HCAP (healthcare-associated pneumonia) 04/10/2019  . Febrile illness, acute 04/08/2019  . Malnutrition of moderate degree 04/03/2019  . GI bleed 04/01/2019  . Dehydration 03/28/2019  . RLL pneumonia 03/17/2019  . Hx of tracheostomy 02/18/2019  . Head and neck cancer ([place]) 02/18/2019  . 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 Stroke ([place]) 07/31/2018  . Rotator cuff syndrome of right shoulder 02/20/2013  . Bradycardia 11/06/2012  . Coronary artery disease   . Abnormal finding on cardiovascular stress test 09/25/2012  . Hypertension 09/03/2012  . Chest pain 09/03/2012  .  Alcohol abuse, daily use 09/03/2012  . Hyperlipidemia 09/03/2012  . Hyponatremia 09/03/2012   Thank you,  Genene Churn, CCC-SLP [phone]  [facility] 10/29/2019, 12:45 PM  [facility] [place], [place], [address] Phone: [phone]   Fax:  [phone]   Name: BEACHER EVERY MRN: [id number] Date of Birth: 1960/05/24

## 2019-10-29 NOTE — Telephone Encounter (Signed)
error 

## 2019-10-29 NOTE — Progress Notes (Signed)
Nutrition Follow-up:  Patient with supraglottic carcinoma.  Patient has completed chemotherapy and radiation therapy (5/6).  Spoke with wife via phone for nutrition follow-up.  Wife reports patient was doing feeding and unavailable to talk to RD.  Wife said patient would call back in about 30 minutes.    RD called again as never heard back from patient.  Spoke with wife and said patient was sick/nausated.  Reports that every time he eats by mouth and then gives tube feeding gets sick.  Sounds as if giving tube feeding right after eating.  Wife reports patient is giving about 4 cartons of feeding on average per day.  Ate eggs, sausage, grits this am for breakfast.  Reports that he really wants to eat by mouth.    SLP note reviewed    Medications: reviewed  Labs: reviewed  Anthropometrics:   159 lb today in SLP clinic increased from 158 lb on 9/28   NUTRITION DIAGNOSIS: Predicted suboptimal energy intake improving   INTERVENTION:  Recommend reducing tube feeding to 3 cartons per day between meal times (10am, 2pm, 8pm).  Flush with 54ml of water before and after.  Continue prostat 31ml daily. Discussed with wife to wait and give tube feeding 1-2 hours after eating orally. Goal would be to reduce tube feeding gradually, maintain weight and increase oral intake.   Wife request RD call and talk with patient tomorrow.       MONITORING, EVALUATION, GOAL: weight trends, intake   NEXT VISIT: October 28 phone f/u  Aldin Drees B. [name], [place], [place] Registered Dietitian [phone] (mobile)

## 2019-10-30 ENCOUNTER — Telehealth: Payer: Self-pay

## 2019-10-30 NOTE — Telephone Encounter (Signed)
Nutrition  Called patient for nutrition follow-up.  No answer.  Left message.   Crystall Donaldson B. [name], [place], [place] Registered Dietitian [phone] (mobile)

## 2019-10-31 ENCOUNTER — Other Ambulatory Visit (HOSPITAL_COMMUNITY): Payer: Self-pay | Admitting: *Deleted

## 2019-10-31 DIAGNOSIS — C321 Malignant neoplasm of supraglottis: Secondary | ICD-10-CM

## 2019-10-31 NOTE — Progress Notes (Signed)
Per Dr. Delton Coombes, orders received for PET scan based on incidental findings from cardiologist.    PET scheduled for 11/11 at 1pm. Patient to arrive 1230, NPO except water 6 hours before.    LM on patients voicemail to call on Monday for details about the appointment.

## 2019-11-03 ENCOUNTER — Other Ambulatory Visit (HOSPITAL_COMMUNITY): Payer: Self-pay

## 2019-11-04 ENCOUNTER — Ambulatory Visit (HOSPITAL_COMMUNITY): Payer: 59 | Attending: Internal Medicine | Admitting: Speech Pathology

## 2019-11-04 ENCOUNTER — Encounter (HOSPITAL_COMMUNITY): Payer: Self-pay | Admitting: Speech Pathology

## 2019-11-04 ENCOUNTER — Other Ambulatory Visit: Payer: Self-pay

## 2019-11-04 DIAGNOSIS — R1312 Dysphagia, oropharyngeal phase: Secondary | ICD-10-CM | POA: Insufficient documentation

## 2019-11-04 NOTE — Therapy (Addendum)
[name], [place], [address] Phone: [phone]   Fax:  [phone]  Speech Language Pathology Treatment  Patient Details  Name: [name] MRN: [id number] Date of Birth: [birth year] No data recorded  Encounter Date: 11/04/2019   End of Session - 11/04/19 1610    Visit Number 5    Number of Visits 8    Date for SLP Re-Evaluation 11/16/19   will request re-certification through 12/17/2019   Authorization Type [employer]    SLP Start Time 1517    SLP Stop Time  1556    SLP Time Calculation (min) 39 min    Activity Tolerance Patient tolerated treatment well           Past Medical History:  Diagnosis Date  . Anxiety   . Arthritis    back  . Bradycardia    a. During 11/2012 adm: lopressor decreased.  . Cancer of larynx ([place])   . Carotid artery stenosis   . 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 Coronary artery disease    a. Diag cath 10/2012 for CP/abnormal nuc -> planned PCI s/p LAD atherectomy/DES placement 11/05/12.  . Dilated aortic root ([place])   . GERD (gastroesophageal reflux disease)   . History of hiatal hernia   . Hypercholesteremia   . Hypertension   . PEG tube malfunction ([place])   . Stroke ([place]) 07/31/2018   bilateral vertebrobasilar occlusion; "left side weaker thanit was before."    Past Surgical History:  Procedure Laterality Date  . CARDIAC CATHETERIZATION  10/29/2012  . CORONARY ANGIOPLASTY WITH STENT PLACEMENT  11/05/2012  . DIRECT LARYNGOSCOPY N/A 01/02/2019   Procedure: MICRO DIRECT LARYNGOSCOPY BIOPSY OF LARYNGEAL MASS;  Surgeon: [name], MD;  Location: [facility];  Service: ENT;  Laterality: N/A;  . ESOPHAGOGASTRODUODENOSCOPY (EGD) WITH PROPOFOL N/A 08/11/2019   Procedure: ATTEMPTED ESOPHAGOGASTRODUODENOSCOPY (EGD) WITH PROPOFOL (UNABLE TO PERFORM PERCUTANEOUS ENDOSCOPIC GASTROSTOMY TUBE REPLACEMENT);   Surgeon: [name], MD;  Location: [facility];  Service: Gastroenterology;  Laterality: N/A;  . HEMORRHOID SURGERY  ~ 2010  . INSERTION OF MESH N/A 06/02/2015   Procedure: INSERTION OF MESH;  Surgeon: [name], MD;  Location: [facility];  Service: General;  Laterality: N/A;  . IR ANGIO INTRA EXTRACRAN SEL COM CAROTID INNOMINATE BILAT MOD SED  08/02/2018  . IR ANGIO VERTEBRAL SEL VERTEBRAL BILAT MOD SED  08/02/2018  . IR REPLACE G-TUBE SIMPLE WO FLUORO  08/12/2019  . IR REPLC GASTRO/COLONIC TUBE PERCUT W/FLUORO  08/13/2019  . LAPAROSCOPIC INSERTION GASTROSTOMY TUBE N/A 02/24/2019   Procedure: LAPAROSCOPIC INSERTION GASTROSTOMY TUBE;  Surgeon: [name], MD;  Location: [place];  Service: General;  Laterality: N/A;  . LEFT HEART CATHETERIZATION WITH CORONARY ANGIOGRAM N/A 10/30/2012   Procedure: LEFT HEART CATHETERIZATION WITH CORONARY ANGIOGRAM;  Surgeon: [name], MD;  Location: [facility] CATH LAB;  Service: Cardiovascular;  Laterality: N/A;  . MULTIPLE EXTRACTIONS WITH ALVEOLOPLASTY N/A 02/18/2019   Procedure: Extraction of tooth #'s 5-7, 10,11,14,20-29, 31 and 32 with alveoloplasty and maxiillary left buccal exostoses reductions;  Surgeon: [name], [name];  Location: [facility];  Service: Oral Surgery;  Laterality: N/A;  . PERCUTANEOUS CORONARY ROTOBLATOR INTERVENTION (PCI-R) N/A 11/05/2012   Procedure: PERCUTANEOUS CORONARY ROTOBLATOR INTERVENTION (PCI-R);  Surgeon: [name], MD;  Location: [facility] C[facility] LAB;  Service: Cardiovascular;  Laterality: N/A;  . TRACHEOSTOMY  TUBE PLACEMENT N/A 02/18/2019   Procedure: Tracheostomy;  Surgeon: [name], MD;  Location: [facility] OR;  Service: ENT;  Laterality: N/A;  . UMBILICAL HERNIA REPAIR N/A 06/02/2015   Procedure: UMBILICAL HERNIORRHAPHY WITH MESH;  Surgeon: [name], MD;  Location: [facility];  Service: General;  Laterality: N/A;    There were no vitals filed for this visit.   Subjective Assessment - 11/04/19 1602    Subjective "I have been eating all  day."    Currently in Pain? No/denies                 ADULT SLP TREATMENT - 11/04/19 1604      General Information   Behavior/Cognition Alert;Cooperative;Pleasant mood    Patient Positioning Upright in chair    Oral care provided N/A    HPI [name] is a 59 yo male who was referred for a clinical swallow evaluation by Dr. 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      Treatment Provided   Treatment provided Dysphagia      Dysphagia Treatment   Temperature Spikes Noted No    Respiratory Status Room air    Oral Cavity - Dentition Edentulous    Treatment Methods Skilled observation;Therapeutic exercise;Compensation strategy training;Patient/caregiver education    Patient observed directly with PO's Yes    Type of PO's observed Thin liquids;Dysphagia 1 (puree)    Feeding Able to feed self    Liquids provided via Cup    Pharyngeal Phase Signs & Symptoms Suspected delayed swallow initiation;Delayed throat clear;Delayed cough    Type of cueing Verbal    Amount of cueing Minimal      Pain Assessment   Pain Assessment No/denies pain      Assessment / Recommendations / Plan   Plan Continue with current plan of care      Progression Toward Goals   Progression toward goals Progressing toward goals            SLP Education - 11/04/19 1606    Education Details continue with oral care, food log, one visit following EGD    Person(s) Educated Patient    Methods Explanation    Comprehension Verbalized understanding            SLP Short Term Goals - 11/04/19 1615      SLP SHORT TERM GOAL #1   Title Pt will demonstrate safe and efficient consumption of self regulated regular textures with thin liquids with use of strategies as needed.    Baseline Currently using 5-6 cans via tube feeds, some thin liquids and soft solids by mouth    Time 2    Period Months    Status On-going    Target  Date 11/16/19      SLP SHORT TERM GOAL #2   Title Pt will complete pharyngeal swallowing exercises as assigned with use of written cue after initial introduction/model from SLP    Baseline Pt was previously given, has not been completing    Time 2    Period Months    Status On-going    Target Date 11/16/19      SLP SHORT TERM GOAL #3   Title Complete MBSS now that trach has been removed and Pt has been eating/drinking more by mouth    Baseline Last MBSS was 07/24/19 with trach in place    Time 2    Period Months    Status Achieved  Target Date 10/30/19            SLP Long Term Goals - 11/04/19 1616      SLP LONG TERM GOAL #1   Title Pt will consume self regulated regular textures and thin liquids with use of strategies as needed.            Plan - 11/04/19 1611    Clinical Impression Statement Pt seen for continued dysphagia therapy. He did not bring his folder with exercises or food log. He does report that he is eating "a lot" (coco puffs in chocolate milk, cheese dog, Pepsi) and has decreased tube feeds to 3 cartons a day. He is not experiencing nausea or regurgitating now that he has spaced out eating by mouth and tube feeds. He completed effortful swallow, CTAR swallows, masako, and oral stretching exercises with min cues in session. During po trials of thin and puree, Pt with suspected delay in swallow initiation, multiple swallows, and occasional delayed cough and throat clear. He was encouraged to continue with exercises at home, fill out food log, and to follow up with SLP after EGD planned for November 22. Pt inquired as to whether he could be prescribed Marinol, smoke weed, and/or eat edibles to help increase appetite (this previously helped with his back pain). I recommended that he discuss with his physician. He has had urges to smoke cigarettes, but has refrained.    Speech Therapy Frequency 1x /week   1-2x per month after chemoradiation   Duration --   1-2 more  visits after his EGD in November   Treatment/Interventions Aspiration precaution training;SLP instruction and feedback;Pharyngeal strengthening exercises;Compensatory strategies;Patient/family education;Compensatory techniques;Diet toleration management by SLP   MBSS   Potential to Achieve Goals Good    Potential Considerations Other (comment)   post radiation sequelae   SLP Home Exercise Plan Pt will complete HEP as assigned to facilitate carryover of treatment strategies and techniques in home environment with written cues    Consulted and Agree with Plan of Care Patient           Patient will benefit from skilled therapeutic intervention in order to improve the following deficits and impairments:   Dysphagia, oropharyngeal phase    Problem List Patient Active Problem List   Diagnosis Date Noted  . PEG (percutaneous endoscopic gastrostomy) adjustment/replacement/removal ([place]) 08/11/2019  . PEG tube malfunction ([place]) 08/10/2019  . HCAP (healthcare-associated pneumonia) 04/10/2019  . Febrile illness, acute 04/08/2019  . Malnutrition of moderate degree 04/03/2019  . GI bleed 04/01/2019  . Dehydration 03/28/2019  . RLL pneumonia 03/17/2019  . Hx of tracheostomy 02/18/2019  . Head and neck cancer ([place]) 02/18/2019  . Malignant neoplasm of supraglottis ([place]) 01/22/2019  . Stroke ([place]) 07/31/2018  . Rotator cuff syndrome of right shoulder 02/20/2013  . Bradycardia 11/06/2012  . Coronary artery disease   . Abnormal finding on cardiovascular stress test 09/25/2012  . Hypertension 09/03/2012  . Chest pain 09/03/2012  . Alcohol abuse, daily use 09/03/2012  . Hyperlipidemia 09/03/2012  . Hyponatremia 09/03/2012   SPEECH THERAPY DISCHARGE SUMMARY  Visits from Start of Care: 5  Current functional level related to goals / functional outcomes: Partially met   Remaining deficits: xerostomia   Education / Equipment: complete  Plan: Patient agrees to discharge.  Patient goals were  partially met. Patient is being discharged due to not returning since the last visit.  ?????        Thank you,  Genene Churn, CCC-SLP [phone]  [name] 11/04/2019, 4:16 PM  [facility] [address] [place], [place], [address] Phone: [phone]   Fax:  [phone]   Name: [name] MRN: [id number] Date of Birth: February 26, 1960

## 2019-11-10 ENCOUNTER — Other Ambulatory Visit: Payer: Self-pay | Admitting: *Deleted

## 2019-11-10 NOTE — Patient Outreach (Signed)
[facility]) Care Management  11/10/2019  [name] 1960/07/01 [id number]  Telephone outreach, Unsuccessful, left message and requested a return call.  Mr. Schurman has started his ST to improve his swallowing per chart review. He is able to eat softer foods, without too much difficulty. He has been given exercises to improve his swallowing ability.  Eulah Pont. Myrtie Neither, MSN, [facility] Gerontological Nurse Practitioner [facility] Care Management [phone]

## 2019-11-11 ENCOUNTER — Telehealth: Payer: Self-pay

## 2019-11-11 ENCOUNTER — Other Ambulatory Visit: Payer: Self-pay | Admitting: *Deleted

## 2019-11-11 NOTE — Patient Outreach (Signed)
[facility]) Care Management  11/11/2019  TIMOTH SCHARA 1960/08/18 [id number]  Telephone outreach.  Talked with Mrs. Pepper initially because Mr. Berg was sleeping. She has A LOT of QUESTIONS regarding his status. She reports he has had a lot of tests recently and is going for more this week, including another PET scan.   She reports he has been eating some and continues to take gastrostomy feedings.  He has been having a cough and she is giving him the Mucinex that we discussed last month and that is helping to thin his secretions so he can cough them out. She reports at this time of year he does get allergies and sinusitus, which could be contributing to his cough and secretions.  Mr. Hasler awakened and did talk to me briefly. He said he is about to "go crazy" and wants "to starting drinking again." He wanted to know if NP knew something that he did not about his condition.  EDUCATION: Discussed the priority is to find out what his cancer status is, any sign of reoccurence or mets and what tx may be suggested. Pt states emphatically that if there is more ca, HE DOES NOT WANT ANY TX, HE HAS SUFFERED ENOUGH. We discussed quality of life and his freedom to make choices. We did discuss that if there is new ca and he doesn't want tx we can get him involved with Hospice to help him with sxs and to keep him comfortable also to provide moral/emotional/spiritual support for both he and his wife. Advised his providers will support him in his decisions about tx or not.  Advised Mrs Spinnato I will call again next week to follow up and offer support. He has his oncology appt on 11/18.  Eulah Pont. Myrtie Neither, MSN, [facility] Gerontological Nurse Practitioner [facility] Care Management [phone]

## 2019-11-11 NOTE — Telephone Encounter (Signed)
Left message for patient to verify telephone visit for pre reg °

## 2019-11-12 ENCOUNTER — Inpatient Hospital Stay (HOSPITAL_COMMUNITY): Payer: 59 | Attending: Hematology

## 2019-11-12 ENCOUNTER — Inpatient Hospital Stay: Payer: 59 | Attending: Radiation Oncology

## 2019-11-12 ENCOUNTER — Other Ambulatory Visit: Payer: Self-pay

## 2019-11-12 ENCOUNTER — Telehealth: Payer: Self-pay

## 2019-11-12 DIAGNOSIS — C321 Malignant neoplasm of supraglottis: Secondary | ICD-10-CM | POA: Insufficient documentation

## 2019-11-12 DIAGNOSIS — R918 Other nonspecific abnormal finding of lung field: Secondary | ICD-10-CM | POA: Diagnosis not present

## 2019-11-12 DIAGNOSIS — Z7982 Long term (current) use of aspirin: Secondary | ICD-10-CM | POA: Insufficient documentation

## 2019-11-12 DIAGNOSIS — Z803 Family history of malignant neoplasm of breast: Secondary | ICD-10-CM | POA: Insufficient documentation

## 2019-11-12 DIAGNOSIS — Z8673 Personal history of transient ischemic attack (TIA), and cerebral infarction without residual deficits: Secondary | ICD-10-CM | POA: Diagnosis not present

## 2019-11-12 DIAGNOSIS — Z87891 Personal history of nicotine dependence: Secondary | ICD-10-CM | POA: Insufficient documentation

## 2019-11-12 DIAGNOSIS — Z79899 Other long term (current) drug therapy: Secondary | ICD-10-CM | POA: Insufficient documentation

## 2019-11-12 DIAGNOSIS — Z800 Family history of malignant neoplasm of digestive organs: Secondary | ICD-10-CM | POA: Insufficient documentation

## 2019-11-12 DIAGNOSIS — C329 Malignant neoplasm of larynx, unspecified: Secondary | ICD-10-CM

## 2019-11-12 DIAGNOSIS — Z931 Gastrostomy status: Secondary | ICD-10-CM | POA: Insufficient documentation

## 2019-11-12 DIAGNOSIS — Z930 Tracheostomy status: Secondary | ICD-10-CM | POA: Diagnosis not present

## 2019-11-12 LAB — CBC WITH DIFFERENTIAL/PLATELET
Abs Immature Granulocytes: 0.02 10*3/uL (ref 0.00–0.07)
Basophils Absolute: 0.1 10*3/uL (ref 0.0–0.1)
Basophils Relative: 1 %
Eosinophils Absolute: 0.9 10*3/uL — ABNORMAL HIGH (ref 0.0–0.5)
Eosinophils Relative: 11 %
HCT: 40.4 % (ref 39.0–52.0)
Hemoglobin: 12.7 g/dL — ABNORMAL LOW (ref 13.0–17.0)
Immature Granulocytes: 0 %
Lymphocytes Relative: 26 %
Lymphs Abs: 2 10*3/uL (ref 0.7–4.0)
MCH: 29.5 pg (ref 26.0–34.0)
MCHC: 31.4 g/dL (ref 30.0–36.0)
MCV: 93.7 fL (ref 80.0–100.0)
Monocytes Absolute: 0.6 10*3/uL (ref 0.1–1.0)
Monocytes Relative: 7 %
Neutro Abs: 4.3 10*3/uL (ref 1.7–7.7)
Neutrophils Relative %: 55 %
Platelets: 352 10*3/uL (ref 150–400)
RBC: 4.31 MIL/uL (ref 4.22–5.81)
RDW: 14.1 % (ref 11.5–15.5)
WBC: 7.9 10*3/uL (ref 4.0–10.5)
nRBC: 0 % (ref 0.0–0.2)

## 2019-11-12 LAB — COMPREHENSIVE METABOLIC PANEL
ALT: 13 U/L (ref 0–44)
AST: 17 U/L (ref 15–41)
Albumin: 3.9 g/dL (ref 3.5–5.0)
Alkaline Phosphatase: 94 U/L (ref 38–126)
Anion gap: 9 (ref 5–15)
BUN: 10 mg/dL (ref 6–20)
CO2: 29 mmol/L (ref 22–32)
Calcium: 9.4 mg/dL (ref 8.9–10.3)
Chloride: 98 mmol/L (ref 98–111)
Creatinine, Ser: 1 mg/dL (ref 0.61–1.24)
GFR, Estimated: >60 mL/min (ref >60)
Glucose, Bld: 97 mg/dL (ref 70–99)
Potassium: 3.4 mmol/L — ABNORMAL LOW (ref 3.5–5.1)
Sodium: 136 mmol/L (ref 135–145)
Total Bilirubin: 0.5 mg/dL (ref 0.3–1.2)
Total Protein: 7.9 g/dL (ref 6.5–8.1)

## 2019-11-12 LAB — MAGNESIUM: Magnesium: 1.9 mg/dL (ref 1.7–2.4)

## 2019-11-12 NOTE — Telephone Encounter (Signed)
Nutrition Follow-up:  Patient with supraglottic carcinoma.  Patient has completed chemotherapy and radiation therapy (5/6).    Wife called RD back.  Tells RD that patient's cancer might be back and patient has already decided that he is not going to take anymore treatments.  Notes reviewed and PET scan planned for tomorrow.  Meeting medical oncology on 11/18.  Wife concerned about upcoming tests.    Wife says that patient is eating more orally and some days does not take tube feeding.  Reports that for breakfast patient will eat eggs, grits, soft cooked sausage.  She does not know what he eats for lunch but has potted meat, tuna fish available.  Supper last night was barbecue chicken strip (1) and macaroni and cheese and then he ate another 1 at midnight.  Wife does not know weight.    Patient does not drink ensure/boost as told that was the reason he was having diarrhea during treatment.   Medications: reviewed  Labs: reviewed   Anthropometrics:   No recent weight  159 lb on 10/27   NUTRITION DIAGNOSIS: Predicted suboptimal energy intake improving   INTERVENTION:  Continue osmolite 1.5, 3 cartons per day between feedings.  Continue prostat 6ml daily.   Encouraged patient to try carnation breakfast essentials shake Encouraged putting meals together of at least 1-2 different food groups (meat and at least 1 side). Keeping food diary would be helpful.   Await plan from medical oncology pending PET scan results.      MONITORING, EVALUATION, GOAL: weight trends, intake   NEXT VISIT: Dec 1 phone f/u  Gerrica Cygan B. [name], [place], [place] Registered Dietitian [phone] (mobile)

## 2019-11-12 NOTE — Progress Notes (Signed)
Nutrition  Called patient for nutrition follow-up. Spoke with wife.  Reports that she is in [employer] and patient is in the car.  Wife reports that patient is taking 3 cartons of tube feeding and eating via mouth as well.  Thinks his cancer is back.  Wife reports that he has to go for a lot of tests.  Noted PET scan tomorrow.    Wife unable to talk with RD as receiving another call medical call.  Says that she will call RD back.    Glender Augusta B. [name], [place], [place] Registered Dietitian [phone] (mobile)

## 2019-11-13 ENCOUNTER — Other Ambulatory Visit: Payer: Self-pay

## 2019-11-13 ENCOUNTER — Encounter (HOSPITAL_COMMUNITY)
Admission: RE | Admit: 2019-11-13 | Discharge: 2019-11-13 | Disposition: A | Discharge status: Home / Self Care | Payer: 59 | Source: Ambulatory Visit | Attending: Hematology | Admitting: Hematology

## 2019-11-13 ENCOUNTER — Other Ambulatory Visit (HOSPITAL_COMMUNITY): Payer: 59

## 2019-11-13 DIAGNOSIS — C321 Malignant neoplasm of supraglottis: Secondary | ICD-10-CM | POA: Insufficient documentation

## 2019-11-13 LAB — GLUCOSE, CAPILLARY: Glucose-Capillary: 93 mg/dL (ref 70–99)

## 2019-11-13 MED ORDER — FLUDEOXYGLUCOSE F - 18 (FDG) INJECTION
9.0100 | Freq: Once | INTRAVENOUS | Status: AC
  Administered 2019-11-13: 9.01 via INTRAVENOUS

## 2019-11-18 ENCOUNTER — Encounter: Payer: 59 | Admitting: Gastroenterology

## 2019-11-20 ENCOUNTER — Other Ambulatory Visit: Payer: Self-pay

## 2019-11-20 ENCOUNTER — Encounter (HOSPITAL_COMMUNITY): Payer: Self-pay | Admitting: Gastroenterology

## 2019-11-20 ENCOUNTER — Inpatient Hospital Stay (HOSPITAL_BASED_OUTPATIENT_CLINIC_OR_DEPARTMENT_OTHER): Payer: 59 | Admitting: Hematology

## 2019-11-20 VITALS — BP 110/75 | HR 66 | Temp 97.2°F | Resp 18 | Wt 152.4 lb

## 2019-11-20 DIAGNOSIS — C321 Malignant neoplasm of supraglottis: Secondary | ICD-10-CM | POA: Diagnosis not present

## 2019-11-20 NOTE — Patient Instructions (Addendum)
[place] at [facility] Discharge Instructions  You were seen today by Dr. Delton Coombes. He went over your recent results and scans. You will be scheduled for a CT scan of your chest before your next visit. Dr. Delton Coombes will see you back in 3 months for labs and follow up.   Thank you for choosing [facility] at [facility] to provide your oncology and hematology care.  To afford each patient quality time with our provider, please arrive at least 15 minutes before your scheduled appointment time.   If you have a lab appointment with the [facility] please come in thru the Main Entrance and check in at the main information desk  You need to re-schedule your appointment should you arrive 10 or more minutes late.  We strive to give you quality time with our providers, and arriving late affects you and other patients whose appointments are after yours.  Also, if you no show three or more times for appointments you may be dismissed from the clinic at the providers discretion.     Again, thank you for choosing [facility].  Our hope is that these requests will decrease the amount of time that you wait before being seen by our physicians.       _____________________________________________________________  Should you have questions after your visit to [facility], please contact our office at [phone] between the hours of 8:00 a.m. and 4:30 p.m.  Voicemails left after 4:00 p.m. will not be returned until the following business day.  For prescription refill requests, have your pharmacy contact our office and allow 72 hours.    Cancer Center Support Programs:   > Cancer Support Group  2nd Tuesday of the month 1pm-2pm, Journey Room

## 2019-11-20 NOTE — Progress Notes (Signed)
[address], [place] [address]   CLINIC:  Medical Oncology/Hematology  PCP:  [name], [address] / [place] [address] [phone]   REASON FOR VISIT:  Follow-up for supraglottic squamous cell carcinoma  PRIOR THERAPY: Chemoradiation with weekly cisplatin from 03/07/2019 to 05/15/2019  NGS Results: Not done  CURRENT THERAPY: Observation  BRIEF ONCOLOGIC HISTORY:  Oncology History  Malignant neoplasm of supraglottis ([place])  01/22/2019 Initial Diagnosis   Malignant neoplasm of supraglottis ([place])   01/30/2019 Cancer Staging   Staging form: Larynx - Supraglottis, AJCC 8th Edition - Clinical: Stage IVA (cT2, cN2c, cM0) - Signed by [name], MD on 01/30/2019   03/07/2019 -  Chemotherapy   The patient had palonosetron (ALOXI) injection 0.25 mg, 0.25 mg, Intravenous,  Once, 6 of 6 cycles Administration: 0.25 mg (03/13/2019), 0.25 mg (03/07/2019), 0.25 mg (03/27/2019), 0.25 mg (05/01/2019), 0.25 mg (05/08/2019), 0.25 mg (05/15/2019) CISplatin (PLATINOL) 79 mg in sodium chloride 0.9 % 250 mL chemo infusion, 40 mg/m2 = 79 mg, Intravenous,  Once, 6 of 6 cycles Administration: 79 mg (03/13/2019), 79 mg (03/07/2019), 79 mg (03/27/2019), 79 mg (05/01/2019), 79 mg (05/08/2019), 79 mg (05/15/2019) fosaprepitant (EMEND) 150 mg in sodium chloride 0.9 % 145 mL IVPB, 150 mg, Intravenous,  Once, 6 of 6 cycles Administration: 150 mg (03/13/2019), 150 mg (03/07/2019), 150 mg (03/27/2019), 150 mg (05/01/2019), 150 mg (05/08/2019), 150 mg (05/15/2019)  for chemotherapy treatment.      CANCER STAGING: Cancer Staging Malignant neoplasm of supraglottis [facility]) Staging form: Larynx - Supraglottis, AJCC 8th Edition - Clinical: Stage IVA (cT2, cN2c, cM0) - Signed by [name], MD on 01/30/2019   INTERVAL HISTORY:  Mr. KENAI FLUEGEL, a 59 y.o. male, returns for routine follow-up of his supraglottic squamous cell carcinoma. Irma was last seen on  05/29/2019.   Today he is accompanied by his wife and he reports feeling okay. He takes in 3 cans of feed through his PEG tube and can drink water, but he cannot eat since he has no teeth or appetite. He reports having occasional leaking out of his trach site. He denies having any recent infections and he reports that he received the flu shot into his left shoulder. He also has intermittent diarrhea and is scheduled for an EGD on 11/22 with Dr. Rush Landmark.   REVIEW OF SYSTEMS:  Review of Systems  Constitutional: Positive for appetite change (50%) and fatigue (75%).  Respiratory: Positive for cough.   Gastrointestinal: Positive for diarrhea (intermittent).  Musculoskeletal: Positive for back pain (7/10 back and legs pain).  Psychiatric/Behavioral: Positive for depression. The patient is nervous/anxious.   All other systems reviewed and are negative.   PAST MEDICAL/SURGICAL HISTORY:  Past Medical History:  Diagnosis Date  . Anxiety   . Arthritis    back  . Bradycardia    a. During 11/2012 adm: lopressor decreased.  . Cancer of larynx ([place])   . Carotid artery stenosis   . 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 Coronary artery disease    a. Diag cath 10/2012 for CP/abnormal nuc -> planned PCI s/p LAD atherectomy/DES placement 11/05/12.  . Dilated aortic root ([place])   . GERD (gastroesophageal reflux disease)   . History of hiatal hernia   . Hypercholesteremia   . Hypertension   . PEG tube malfunction ([place])   . Stroke [facility]) 07/31/2018  bilateral vertebrobasilar occlusion; "left side weaker thanit was before."   Past Surgical History:  Procedure Laterality Date  . CARDIAC CATHETERIZATION  10/29/2012  . CORONARY ANGIOPLASTY WITH STENT PLACEMENT  11/05/2012  . DIRECT LARYNGOSCOPY N/A 01/02/2019   Procedure: MICRO DIRECT LARYNGOSCOPY BIOPSY OF LARYNGEAL MASS;  Surgeon: [name], MD;  Location: [facility] OR;   Service: ENT;  Laterality: N/A;  . ESOPHAGOGASTRODUODENOSCOPY (EGD) WITH PROPOFOL N/A 08/11/2019   Procedure: ATTEMPTED ESOPHAGOGASTRODUODENOSCOPY (EGD) WITH PROPOFOL (UNABLE TO PERFORM PERCUTANEOUS ENDOSCOPIC GASTROSTOMY TUBE REPLACEMENT);  Surgeon: [name], MD;  Location: [facility] ORS;  Service: Gastroenterology;  Laterality: N/A;  . HEMORRHOID SURGERY  ~ 2010  . INSERTION OF MESH N/A 06/02/2015   Procedure: INSERTION OF MESH;  Surgeon: [name], MD;  Location: [facility] ORS;  Service: General;  Laterality: N/A;  . IR ANGIO INTRA EXTRACRAN SEL COM CAROTID INNOMINATE BILAT MOD SED  08/02/2018  . IR ANGIO VERTEBRAL SEL VERTEBRAL BILAT MOD SED  08/02/2018  . IR REPLACE G-TUBE SIMPLE WO FLUORO  08/12/2019  . IR REPLC GASTRO/COLONIC TUBE PERCUT W/FLUORO  08/13/2019  . LAPAROSCOPIC INSERTION GASTROSTOMY TUBE N/A 02/24/2019   Procedure: LAPAROSCOPIC INSERTION GASTROSTOMY TUBE;  Surgeon: [name], MD;  Location: [facility];  Service: General;  Laterality: N/A;  . LEFT HEART CATHETERIZATION WITH CORONARY ANGIOGRAM N/A 10/30/2012   Procedure: LEFT HEART CATHETERIZATION WITH CORONARY ANGIOGRAM;  Surgeon: [name], MD;  Location: [facility] CATH LAB;  Service: Cardiovascular;  Laterality: N/A;  . MULTIPLE EXTRACTIONS WITH ALVEOLOPLASTY N/A 02/18/2019   Procedure: Extraction of tooth #'s 5-7, 10,11,14,20-29, 31 and 32 with alveoloplasty and maxiillary left buccal exostoses reductions;  Surgeon: [name], [name];  Location: [facility];  Service: Oral Surgery;  Laterality: N/A;  . PERCUTANEOUS CORONARY ROTOBLATOR INTERVENTION (PCI-R) N/A 11/05/2012   Procedure: PERCUTANEOUS CORONARY ROTOBLATOR INTERVENTION (PCI-R);  Surgeon: [name], MD;  Location: [facility] CATH LAB;  Service: Cardiovascular;  Laterality: N/A;  . TRACHEOSTOMY TUBE PLACEMENT N/A 02/18/2019   Procedure: Tracheostomy;  Surgeon: [name], MD;  Location: [place];  Service: ENT;  Laterality: N/A;  . UMBILICAL HERNIA REPAIR N/A 06/02/2015   Procedure: UMBILICAL  HERNIORRHAPHY WITH MESH;  Surgeon: [name], MD;  Location: [facility];  Service: General;  Laterality: N/A;    SOCIAL HISTORY:  Social History   Socioeconomic History  . Marital status: Married    Spouse name: [name]  . Number of children: 0  . Years of education: Not on file  . Highest education level: Not on file  Occupational History  . Occupation: Disabled    Comment: previously worked in Nurse, learning disability as an Investment banker, corporate  . Smoking status: Former Smoker    Packs/day: 3.00    Years: 32.00    Pack years: 96.00    Types: Cigarettes    Start date: 08/29/1977    Quit date: 11/29/2006    Years since quitting: 12.9  . Smokeless tobacco: Never Used  . Tobacco comment: 11/05/2012 "chewed tobacco when I play ball; aien't chewed since age 89"  Vaping Use  . Vaping Use: Never used  Substance and Sexual Activity  . Alcohol use: Not Currently    Alcohol/week: 12.0 standard drinks    Types: 12 Cans of beer per week    Comment: None since 02/2019   . Drug use: Not Currently    Types: Marijuana    Comment: denies on 04/08/19  . Sexual activity: Not Currently  Other Topics Concern  . Not on  file  Social History Narrative   Patient is disabled.  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  Patient previously 3 pack/day use.   Patient currently drinking 3-4 beers per day from 12 pack a day.   Patient denies use of chew or other illicit drugs.   Social Determinants of Health   Financial Resource Strain: Low Risk   . Difficulty of Paying Living Expenses: Not hard at all  Food Insecurity: No Food Insecurity  . Worried About Charity fundraiser in the Last Year: Never true  . Ran Out of Food in the Last Year: Never true  Transportation Needs: No Transportation Needs  . Lack of Transportation (Medical): No  . Lack of Transportation (Non-Medical): No  Physical Activity:  Insufficiently Active  . Days of Exercise per Week: 2 days  . Minutes of Exercise per Session: 30 min  Stress: Stress Concern Present  . Feeling of Stress : To some extent  Social Connections: Socially Isolated  . Frequency of Communication with Friends and Family: Once a week  . Frequency of Social Gatherings with Friends and Family: Once a week  . Attends Religious Services: Never  . Active Member of Clubs or Organizations: No  . Attends Archivist Meetings: Never  . Marital Status: Married  Human resources officer Violence: Not At Risk  . Fear of Current or Ex-Partner: No  . Emotionally Abused: No  . Physically Abused: No  . Sexually Abused: No    FAMILY HISTORY:  Family History  Problem Relation Age of Onset  . Hypertension Mother   . Heart disease Father   . Hypertension Father   . Heart disease Sister   . Hypertension Maternal Grandmother   . Hypertension Maternal Grandfather   . Hypertension Paternal Grandmother   . Hypertension Paternal Grandfather   . Pancreatic cancer Maternal Uncle   . Breast cancer Maternal Aunt   . Breast cancer Maternal Aunt   . Diabetes Paternal Uncle   . Diabetes Paternal Aunt     CURRENT MEDICATIONS:  Current Outpatient Medications  Medication Sig Dispense Refill  . acetaminophen (TYLENOL) 325 MG tablet Take 2 tablets (650 mg total) by mouth every 6 (six) hours as needed for mild pain, fever or headache (or Fever >/= 101). 12 tablet 0  . Amino Acids-Protein Hydrolys (FEEDING SUPPLEMENT, PRO-STAT SUGAR FREE 64,) LIQD Place 30 mLs into feeding tube 3 (three) times daily with meals. 887 mL 2  . amLODipine (NORVASC) 5 MG tablet Take 5 mg by mouth daily.    Marland Kitchen aspirin EC 81 MG tablet Take 81 mg by mouth daily.     Marland Kitchen atorvastatin (LIPITOR) 40 MG tablet Take 40 mg by mouth at bedtime.     . diazepam (VALIUM) 10 MG tablet Place 10 mg into feeding tube 3 (three) times daily as needed for anxiety or sleep.     . famotidine (PEPCID) 20 MG tablet  Take 20 mg by mouth at bedtime.     Marland Kitchen HYDROcodone-acetaminophen (NORCO) 10-325 MG tablet Take 1 tablet by mouth every 4 (four) hours as needed. (Patient taking differently: Take 1 tablet by mouth every 4 (four) hours as needed for moderate pain or severe pain. ) 50 tablet 0  . lidocaine (XYLOCAINE) 2 % solution Patient: Mix 1part 2% viscous lidocaine, 1part H20. Swish & swallow 72mL of diluted mixture, 79min before meals and at bedtime,  up to QID 200 mL 4  . loperamide (IMODIUM A-D) 2 MG capsule Take 2 mg by mouth daily as needed for diarrhea or loose stools.    . metoprolol tartrate (LOPRESSOR) 25 MG tablet Take 12.5 mg by mouth 2 (two) times daily.    . nitroGLYCERIN (NITROSTAT) 0.4 MG SL tablet Place 1 tablet (0.4 mg total) under the tongue every 5 (five) minutes as needed for chest pain (CP or SOB). 25 tablet 3  . Nutritional Supplements (FEEDING SUPPLEMENT, OSMOLITE 1.5 CAL,) LIQD Give 2 cartons of osmolite 1.5 at 9:30am, 1:30, 5:30 and 1 carton at 9:30pm.  Flush with 82ml of water before and 143ml after each feeding. Meets 100% of patient needs. Send bolus supplies. (Patient taking differently: Place 1,000 mLs into feeding tube in the morning, at noon, in the evening, and at bedtime. Give 2 cartons of osmolite 1.5 at 9:30am, 1:30, 5:30 and 1 carton at 9:30pm.  Flush with 45ml of water before and 135ml after each feeding. Meets 100% of patient needs. Send bolus supplies.) 1659 mL 0  . Nutritional Supplements (PROSOURCE TF) LIQD Give 13ml 2 times per day via feeding tube.  Mix with 90ml of water.  Flush tube with 20ml of water, then place prosource and water mixture in tube and flush with 16ml of water after. (Patient taking differently: Place 30 mLs into feeding tube in the morning and at bedtime. Mix with 9ml of water.  Flush tube with 3ml of water, then place prosource and water mixture in tube and flush with 20ml of water after.) 60 mL 3  . omeprazole (PRILOSEC) 20 MG capsule Take 20 mg by mouth  daily.     . prochlorperazine (COMPAZINE) 10 MG tablet TAKE 1 TABLET(10 MG) BY MOUTH EVERY 6 HOURS AS NEEDED FOR NAUSEA (Patient taking differently: Take 10 mg by mouth every 6 (six) hours as needed for nausea or vomiting. ) 60 tablet 2  . traMADol (ULTRAM) 50 MG tablet Take 50-100 mg by mouth 4 (four) times daily as needed (pain.).     Marland Kitchen Water For Irrigation, Sterile (FREE WATER) SOLN Place 100 mLs into feeding tube See admin instructions. Please Flush PEG tube with 1 ounce (30 ml) before giving to feed, and then flush with 3 ounces (92ml) after giving to feed--- up to 4 times a day 1000 mL 23   No current facility-administered medications for this visit.    ALLERGIES:  Allergies  Allergen Reactions  . Duloxetine Other (See Comments)    Caused depression  . Prednisone Other (See Comments)    Chest pain    PHYSICAL EXAM:  Performance status (ECOG): 1 - Symptomatic but completely ambulatory  Vitals:   11/20/19 1414  BP: 110/75  Pulse: 66  Resp: 18  Temp: (!) 97.2 F (36.2 C)  SpO2: 100%   Wt Readings from Last 3 Encounters:  11/20/19 152 lb 6.4 oz (69.1 kg)  09/30/19 158 lb (71.7 kg)  09/15/19 157 lb (71.2 kg)   Physical Exam Vitals reviewed.  Constitutional:      Appearance: Normal appearance.  Cardiovascular:     Rate and Rhythm: Normal rate and regular rhythm.     Pulses: Normal pulses.     Heart sounds: Normal heart sounds.  Pulmonary:     Effort: Pulmonary effort is normal.     Breath sounds: Normal breath sounds.  Abdominal:     Comments: G-tube  Neurological:     General: No focal deficit present.  Mental Status: He is alert and oriented to person, place, and time.  Psychiatric:        Mood and Affect: Mood normal.        Behavior: Behavior normal.      LABORATORY DATA:  I have reviewed the labs as listed.  CBC Latest Ref Rng & Units 11/12/2019 08/10/2019 05/29/2019  WBC 4.0 - 10.5 K/uL 7.9 7.5 2.2(L)  Hemoglobin 13.0 - 17.0 g/dL 12.7(L) 12.1(L)  8.8(L)  Hematocrit 39 - 52 % 40.4 37.4(L) 26.7(L)  Platelets 150 - 400 K/uL 352 314 120(L)   CMP Latest Ref Rng & Units 11/12/2019 10/20/2019 08/10/2019  Glucose 70 - 99 mg/dL 97 - 106(H)  BUN 6 - 20 mg/dL 10 - 14  Creatinine 0.61 - 1.24 mg/dL 1.00 1.00 0.94  Sodium 135 - 145 mmol/L 136 - 138  Potassium 3.5 - 5.1 mmol/L 3.4(L) - 3.8  Chloride 98 - 111 mmol/L 98 - 101  CO2 22 - 32 mmol/L 29 - 25  Calcium 8.9 - 10.3 mg/dL 9.4 - 9.9  Total Protein 6.5 - 8.1 g/dL 7.9 - -  Total Bilirubin 0.3 - 1.2 mg/dL 0.5 - -  Alkaline Phos 38 - 126 U/L 94 - -  AST 15 - 41 U/L 17 - -  ALT 0 - 44 U/L 13 - -    DIAGNOSTIC IMAGING:  I have independently reviewed the scans and discussed with the patient. NM PET Image Restag (PS) Skull Base To Thigh  Result Date: 11/14/2019 CLINICAL DATA:  Subsequent treatment strategy for head and neck cancer. EXAM: NUCLEAR MEDICINE PET SKULL BASE TO THIGH TECHNIQUE: 9.01 mCi F-18 FDG was injected intravenously. Full-ring PET imaging was performed from the skull base to thigh after the radiotracer. CT data was obtained and used for attenuation correction and anatomic localization. Fasting blood glucose: 93 mg/dl COMPARISON:  PET-CT FINDINGS: Mediastinal blood pool activity: SUV max 2.10 Liver activity: SUV max NA NECK: There is moderate hypermetabolism noted along the prior tracheostomy tract. I do not see an obvious mass on the CT scan. This is most likely healing granulation tissue. SUV max is 7.37. I would recommend clinical correlation to make sure there is no developing lesion or recurrent tumor here. No neck lymphadenopathy the. Incidental CT findings: Stable significant age advanced carotid artery calcifications. CHEST: The right upper lobe pulmonary nodule shown to be enlarging on the most recent chest CT demonstrates mild hypermetabolism with SUV max of 2.18. Given its small size and interval growth it is worrisome for a metastatic pulmonary nodule. No other pulmonary  lesions are identified. There is a 5 mm left axillary lymph node on image 76/4 which appears slightly larger when compared to the prior PET-CT. It does show mild hypermetabolism with SUV max of 3.49. It certainly could be inflammatory but will need observation. 6.5 mm precarinal lymph node on image number 80/for is slightly enlarged. It measured 5 mm on the prior study. Mild hypermetabolism with SUV max of 3.79. The adjacent 7 mm lymph node is also mildly hypermetabolic. SUV max on the prior study was 3.2. Difficult to measure bilateral hilar lymph nodes are hypermetabolic. The node on the left has an SUV max of 4.04 and was previously 2.67. Right-sided node has an SUV max of 3.72 and I do not see this on the prior study fissure. Small cluster of nodes in the right paratracheal area also appears slightly larger and demonstrate mild hypermetabolism with SUV max of 3.50. These were previously 3.12. Incidental  CT findings: Stable emphysematous changes and pulmonary scarring. Stable significant age advanced coronary artery and aortic calcifications. ABDOMEN/PELVIS: No PET-CT findings suspicious for abdominal/pelvic metastatic disease. No definite liver lesions or adrenal lesions. No enlarged or hypermetabolic lymph nodes. Small scattered lymph nodes are stable. Incidental CT findings: Severe age advanced atherosclerotic calcifications involving the aorta and branch vessels. SKELETON: No focal hypermetabolic activity to suggest skeletal metastasis. Incidental CT findings: none IMPRESSION: 1. Moderate hypermetabolism noted in the prior tracheostomy tract, likely granulation tissue but recommend clinical correlation and attention on future studies. No lymphadenopathy in the neck. 2. 6 mm right upper lobe nodule is mildly hypermetabolic and suspicious for a metastatic lesion. 3. Slightly larger and mildly hypermetabolic left axillary lymph node is indeterminate. Recommend attention on future studies. 4. Slightly larger and  slightly greater hypermetabolism in the mediastinal and hilar lymph nodes as detailed above. 5. No findings for abdominal/pelvic metastatic disease or osseous metastatic disease. Electronically Signed   By: Marijo Sanes M.D.   On: 11/14/2019 09:05   ECHOCARDIOGRAM COMPLETE  Result Date: 10/24/2019    ECHOCARDIOGRAM REPORT   Patient Name:   HORACE LUKAS Date of Exam: 10/24/2019 Medical Rec #:  [id number]     Height:       69.0 in Accession #:    [id number]    Weight:       158.0 lb Date of Birth:  04-Nov-1960     BSA:          1.869 m Patient Age:    59 years      BP:           146/85 mmHg Patient Gender: M             HR:           83 bpm. Exam Location:  [facility] Procedure: 2D Echo, Cardiac Doppler and Color Doppler Indications:    I77.810 (ICD-10-CM) - Dilated aortic root  History:        Patient has prior history of Echocardiogram examinations, most                 recent 04/03/2019. CAD, Stroke; Risk Factors:Hypertension and                 Dyslipidemia. GERD. Cancer.  Sonographer:    Alvino Chapel RCS Referring Phys: [phone] [place]  1. Left ventricular ejection fraction, by estimation, is 60 to 65%. The left ventricle has normal function. The left ventricle has no regional wall motion abnormalities. There is mild left ventricular hypertrophy. Left ventricular diastolic parameters are consistent with Grade I diastolic dysfunction (impaired relaxation).  2. Right ventricular systolic function is normal. The right ventricular size is normal.  3. The mitral valve is normal in structure. No evidence of mitral valve regurgitation. No evidence of mitral stenosis.  4. The aortic valve is tricuspid. Aortic valve regurgitation is mild. No aortic stenosis is present.  5. Aortic dilatation noted. There is moderate dilatation of the aortic root, measuring 44 mm.  6. The inferior vena cava is normal in size with greater than 50% respiratory variability, suggesting right atrial pressure of 3 mmHg. FINDINGS   Left Ventricle: Left ventricular ejection fraction, by estimation, is 60 to 65%. The left ventricle has normal function. The left ventricle has no regional wall motion abnormalities. The left ventricular internal cavity size was normal in size. There is  mild left ventricular hypertrophy. Left ventricular diastolic parameters are consistent with Grade I  diastolic dysfunction (impaired relaxation). Normal left ventricular filling pressure. Right Ventricle: The right ventricular size is normal. No increase in right ventricular wall thickness. Right ventricular systolic function is normal. Left Atrium: Left atrial size was normal in size. Right Atrium: Right atrial size was normal in size. Pericardium: There is no evidence of pericardial effusion. Mitral Valve: The mitral valve is normal in structure. No evidence of mitral valve regurgitation. No evidence of mitral valve stenosis. Tricuspid Valve: The tricuspid valve is normal in structure. Tricuspid valve regurgitation is mild . No evidence of tricuspid stenosis. Aortic Valve: The aortic valve is tricuspid. Aortic valve regurgitation is mild. No aortic stenosis is present. Pulmonic Valve: The pulmonic valve was not well visualized. Pulmonic valve regurgitation is not visualized. No evidence of pulmonic stenosis. Aorta: Aortic dilatation noted. There is moderate dilatation of the aortic root, measuring 44 mm. Pulmonary Artery: Indeterminant PASP, inadequate TR jet. Venous: The inferior vena cava is normal in size with greater than 50% respiratory variability, suggesting right atrial pressure of 3 mmHg. IAS/Shunts: No atrial level shunt detected by color flow Doppler.  LEFT VENTRICLE PLAX 2D LVIDd:         4.27 cm  Diastology LVIDs:         2.91 cm  LV e' medial:    7.51 cm/s LV PW:         1.10 cm  LV E/e' medial:  5.9 LV IVS:        1.18 cm  LV e' lateral:   9.25 cm/s LVOT diam:     2.30 cm  LV E/e' lateral: 4.8 LV SV:         80 LV SV Index:   43 LVOT Area:     4.15  cm  RIGHT VENTRICLE RV S prime:     13.40 cm/s TAPSE (M-mode): 1.7 cm LEFT ATRIUM             Index       RIGHT ATRIUM           Index LA diam:        3.60 cm 1.93 cm/m  RA Area:     16.60 cm LA Vol (A2C):   31.8 ml 16.99 ml/m RA Volume:   42.40 ml  22.69 ml/m LA Vol (A4C):   46.1 ml 24.66 ml/m LA Biplane Vol: 44.6 ml 23.86 ml/m  AORTIC VALVE LVOT Vmax:   106.00 cm/s LVOT Vmean:  70.300 cm/s LVOT VTI:    0.192 m  AORTA Ao Root diam: 4.10 cm MITRAL VALVE MV Area (PHT): 1.79 cm    SHUNTS MV Decel Time: 423 msec    Systemic VTI:  0.19 m MV E velocity: 44.20 cm/s  Systemic Diam: 2.30 cm MV A velocity: 75.10 cm/s MV E/A ratio:  0.59 Carlyle Dolly MD Electronically signed by Carlyle Dolly MD Signature Date/Time: 10/24/2019/5:08:12 PM    Final      ASSESSMENT:  1.  T3N2C supraglottic squamous cell carcinoma: -Laryngoscopy and biopsy on 01/02/2019, trach placed on 02/18/2019. -PET scan on 01/27/2019 showed hypermetabolic laryngeal lesion with hypermetabolic cervical metastatic disease bilaterally. -Weekly cisplatin and radiation started on 03/07/2019, many interruptions due to hospitalizations. -XRT completed on 05/08/2019 and last weekly cisplatin on 05/15/2019. -CT angio chest on 10/20/2019 showed 6 mm right upper lobe lung nodule increased in size from prior PET scan.  Resolved subpleural nodules laterally in the right upper lobe.  Subcentimeter hyperenhancing liver lesions. -PET scan on 11/13/2059 showed moderate hypermetabolism noted in  the prior tracheostomy tract, likely granulation tissue.  No lymphadenopathy in the neck.  6 mm right upper lobe nodule is mildly hypermetabolic with SUV 5.59.  5 mm left axillary lymph node with SUV 3.49.  6.5 mm precarinal lymph node slightly enlarged from 5 mm.  Difficult to measure bilateral hilar lymph nodes are hypermetabolic.  2.  Nutrition: -He has a PEG tube.  He is using Osmolite 1.53 to 4 cans/day.  3.  CVA: -She had pontine stroke with large vessel  occlusion. -He had bleeding from trach site while on Plavix which was held since 03/04/2019.   PLAN:  1.  Supraglottic squamous cell carcinoma: -Reviewed results of the CT scan of the chest which showed right upper lobe lung nodule and possible liver lesions. -We have ordered a PET CT scan because of the CT scan findings.  PET scan showed uptake in the right upper lobe lung nodule.  It is in a difficult area to biopsy at this time.  Hence have recommended follow-up with a CT scan in 3 months with contrast.  Other nonspecific lymph nodes in the mediastinum and hilum present.  Most of these are present on prior PET scan which was done in January.  He reports getting flu shot in the left arm, likely causing lymphadenopathy. -Reviewed his labs which showed normal chemistries and near normal CBC. -As it is too risky to biopsy the right lung lesion, I have recommended follow-up CT scan.  Patient and wife agreeable. -He should continue ENT visits for direct exam.  2.  Nutrition: -He is taking an Osmolite 1.5, 3 cans/day.  3.  CVA: -Plavix on hold.  Continue aspirin 81 mg daily.   Orders placed this encounter:  Orders Placed This Encounter  Procedures  . CT Chest W Contrast     [name], MD [place] [phone]   I, Milinda Antis, am acting as a scribe for Dr. Sanda Linger.  I, [name] MD, have reviewed the above documentation for accuracy and completeness, and I agree with the above.

## 2019-11-21 ENCOUNTER — Encounter (HOSPITAL_COMMUNITY): Payer: Self-pay | Admitting: Gastroenterology

## 2019-11-21 ENCOUNTER — Other Ambulatory Visit (HOSPITAL_COMMUNITY)
Admission: RE | Admit: 2019-11-21 | Discharge: 2019-11-21 | Disposition: A | Discharge status: Home / Self Care | Payer: 59 | Source: Ambulatory Visit | Attending: Gastroenterology | Admitting: Gastroenterology

## 2019-11-21 DIAGNOSIS — Z01812 Encounter for preprocedural laboratory examination: Secondary | ICD-10-CM | POA: Diagnosis present

## 2019-11-21 DIAGNOSIS — Z20822 Contact with and (suspected) exposure to covid-19: Secondary | ICD-10-CM | POA: Insufficient documentation

## 2019-11-21 LAB — SARS CORONAVIRUS 2 (TAT 6-24 HRS): SARS Coronavirus 2: NEGATIVE

## 2019-11-21 NOTE — Progress Notes (Signed)
Spoke with pt's wife, [name] for pre-op call. DPR on file. Pt has hx of CAD with stents. Dr. Domenic Polite is his cardiologist. Last office visit was 09/15/19 and saw Melina Copa PA. She states pt is not diabetic. He does have a Peg tube for nourishment. 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  Covid test done today, result pending. [name] states he is in quarantine since the test was done and understands he stays in it until Monday when he comes to the hospital.   EKG - 04/08/19 Echo - 10/24/19

## 2019-11-24 ENCOUNTER — Ambulatory Visit (HOSPITAL_COMMUNITY)
Admission: RE | Admit: 2019-11-24 | Discharge: 2019-11-24 | Disposition: A | Discharge status: Home / Self Care | Payer: 59 | Attending: Gastroenterology | Admitting: Gastroenterology

## 2019-11-24 ENCOUNTER — Ambulatory Visit (HOSPITAL_COMMUNITY): Payer: 59 | Admitting: Certified Registered Nurse Anesthetist

## 2019-11-24 ENCOUNTER — Other Ambulatory Visit: Payer: Self-pay

## 2019-11-24 ENCOUNTER — Encounter (HOSPITAL_COMMUNITY)
Admission: RE | Disposition: A | Discharge status: Home / Self Care | Payer: Self-pay | Source: Home / Self Care | Attending: Gastroenterology

## 2019-11-24 ENCOUNTER — Encounter (HOSPITAL_COMMUNITY): Payer: Self-pay | Admitting: Gastroenterology

## 2019-11-24 ENCOUNTER — Ambulatory Visit (HOSPITAL_COMMUNITY): Payer: 59

## 2019-11-24 DIAGNOSIS — Z09 Encounter for follow-up examination after completed treatment for conditions other than malignant neoplasm: Secondary | ICD-10-CM | POA: Diagnosis not present

## 2019-11-24 DIAGNOSIS — Z1211 Encounter for screening for malignant neoplasm of colon: Secondary | ICD-10-CM | POA: Diagnosis present

## 2019-11-24 DIAGNOSIS — K641 Second degree hemorrhoids: Secondary | ICD-10-CM | POA: Insufficient documentation

## 2019-11-24 DIAGNOSIS — K222 Esophageal obstruction: Secondary | ICD-10-CM | POA: Insufficient documentation

## 2019-11-24 DIAGNOSIS — R1310 Dysphagia, unspecified: Secondary | ICD-10-CM | POA: Insufficient documentation

## 2019-11-24 DIAGNOSIS — Z923 Personal history of irradiation: Secondary | ICD-10-CM | POA: Diagnosis not present

## 2019-11-24 DIAGNOSIS — J387 Other diseases of larynx: Secondary | ICD-10-CM | POA: Diagnosis not present

## 2019-11-24 DIAGNOSIS — Z888 Allergy status to other drugs, medicaments and biological substances status: Secondary | ICD-10-CM | POA: Diagnosis not present

## 2019-11-24 DIAGNOSIS — R52 Pain, unspecified: Secondary | ICD-10-CM

## 2019-11-24 DIAGNOSIS — R630 Anorexia: Secondary | ICD-10-CM | POA: Diagnosis not present

## 2019-11-24 DIAGNOSIS — Z8521 Personal history of malignant neoplasm of larynx: Secondary | ICD-10-CM | POA: Insufficient documentation

## 2019-11-24 DIAGNOSIS — Z9221 Personal history of antineoplastic chemotherapy: Secondary | ICD-10-CM | POA: Diagnosis not present

## 2019-11-24 DIAGNOSIS — Z87891 Personal history of nicotine dependence: Secondary | ICD-10-CM | POA: Diagnosis not present

## 2019-11-24 DIAGNOSIS — Z931 Gastrostomy status: Secondary | ICD-10-CM | POA: Insufficient documentation

## 2019-11-24 HISTORY — PX: SAVORY DILATION: SHX5439

## 2019-11-24 HISTORY — PX: COLONOSCOPY WITH PROPOFOL: SHX5780

## 2019-11-24 HISTORY — DX: Depression, unspecified: F32.A

## 2019-11-24 HISTORY — PX: BIOPSY: SHX5522

## 2019-11-24 HISTORY — DX: Pneumonia, unspecified organism: J18.9

## 2019-11-24 HISTORY — PX: ESOPHAGOGASTRODUODENOSCOPY (EGD) WITH PROPOFOL: SHX5813

## 2019-11-24 SURGERY — ESOPHAGOGASTRODUODENOSCOPY (EGD) WITH PROPOFOL
Anesthesia: Monitor Anesthesia Care

## 2019-11-24 MED ORDER — OMEPRAZOLE 40 MG PO CPDR
40.0000 mg | DELAYED_RELEASE_CAPSULE | Freq: Every day | ORAL | 4 refills | Status: DC
Start: 2019-11-24 — End: 2020-01-26

## 2019-11-24 MED ORDER — SODIUM CHLORIDE 0.9 % IV SOLN: INTRAVENOUS | Status: DC

## 2019-11-24 MED ORDER — LIDOCAINE 2% (20 MG/ML) 5 ML SYRINGE
INTRAMUSCULAR | Status: DC | PRN
  Administered 2019-11-24: 60 mg via INTRAVENOUS

## 2019-11-24 MED ORDER — LACTATED RINGERS IV SOLN: INTRAVENOUS | Status: DC | PRN

## 2019-11-24 MED ORDER — PROPOFOL 10 MG/ML IV BOLUS
INTRAVENOUS | Status: DC | PRN
  Administered 2019-11-24: 20 mg via INTRAVENOUS
  Administered 2019-11-24: 30 mg via INTRAVENOUS

## 2019-11-24 MED ORDER — PROPOFOL 500 MG/50ML IV EMUL
INTRAVENOUS | Status: DC | PRN
  Administered 2019-11-24: 100 ug/kg/min via INTRAVENOUS

## 2019-11-24 SURGICAL SUPPLY — 24 items

## 2019-11-24 NOTE — Anesthesia Postprocedure Evaluation (Signed)
Anesthesia Post Note  Patient: [name]  Procedure(s) Performed: ESOPHAGOGASTRODUODENOSCOPY (EGD) WITH PROPOFOL (N/A ) COLONOSCOPY WITH PROPOFOL (N/A ) BIOPSY     Patient location during evaluation: PACU Anesthesia Type: MAC Level of consciousness: awake and alert Pain management: pain level controlled Vital Signs Assessment: post-procedure vital signs reviewed and stable Respiratory status: spontaneous breathing, nonlabored ventilation and respiratory function stable Cardiovascular status: stable, blood pressure returned to baseline and bradycardic Anesthetic complications: no   No complications documented.  Last Vitals:  Vitals:   11/24/19 1505 11/24/19 1515  BP: (!) 180/98 (!) 182/79  Pulse: (!) 51 (!) 50  Resp: 14 12  Temp:    SpO2: 100% 97%    Last Pain:  Vitals:   11/24/19 1515  TempSrc:   PainSc: 0-No pain                 Audry Pili

## 2019-11-24 NOTE — Transfer of Care (Signed)
Immediate Anesthesia Transfer of Care Note  Patient: [name]  Procedure(s) Performed: ESOPHAGOGASTRODUODENOSCOPY (EGD) WITH PROPOFOL (N/A ) COLONOSCOPY WITH PROPOFOL (N/A ) BIOPSY  Patient Location: PACU  Anesthesia Type:MAC  Level of Consciousness: awake and patient cooperative  Airway & Oxygen Therapy: Patient Spontanous Breathing and Patient connected to face mask oxygen  Post-op Assessment: Report given to RN and Post -op Vital signs reviewed and stable  Post vital signs: Reviewed and stable  Last Vitals:  Vitals Value Taken Time  BP 128/83 11/24/19 1456  Temp    Pulse    Resp 17 11/24/19 1456  SpO2    Vitals shown include unvalidated device data.  Last Pain:  Vitals:   11/24/19 1213  TempSrc: Oral  PainSc: 6          Complications: No complications documented.

## 2019-11-24 NOTE — H&P (Signed)
GASTROENTEROLOGY PROCEDURE H&P NOTE   Primary Care Physician: [name], MD  HPI: [name] is a 59 y.o. male who presents for EGD with dilation attempt with history of Radiation for Head/Neck Cancer.  Possible colonoscopy if preparation completed.    Past Medical History:  Diagnosis Date  . Anxiety   . Arthritis    back  . Bradycardia    a. During 11/2012 adm: lopressor decreased.  . Cancer of larynx ([place])   . Carotid artery stenosis   . 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 Coronary artery disease    a. Diag cath 10/2012 for CP/abnormal nuc -> planned PCI s/p LAD atherectomy/DES placement 11/05/12.  . Depression   . Dilated aortic root ([place])   . GERD (gastroesophageal reflux disease)   . History of hiatal hernia   . Hypercholesteremia   . Hypertension   . PEG tube malfunction ([place])   . Pneumonia   . Stroke ([place]) 07/31/2018   bilateral vertebrobasilar occlusion; "left side weaker thanit was before."   Past Surgical History:  Procedure Laterality Date  . CARDIAC CATHETERIZATION  10/29/2012  . CORONARY ANGIOPLASTY WITH STENT PLACEMENT  11/05/2012  . DIRECT LARYNGOSCOPY N/A 01/02/2019   Procedure: MICRO DIRECT LARYNGOSCOPY BIOPSY OF LARYNGEAL MASS;  Surgeon: [name], MD;  Location: [facility];  Service: ENT;  Laterality: N/A;  . ESOPHAGOGASTRODUODENOSCOPY (EGD) WITH PROPOFOL N/A 08/11/2019   Procedure: ATTEMPTED ESOPHAGOGASTRODUODENOSCOPY (EGD) WITH PROPOFOL (UNABLE TO PERFORM PERCUTANEOUS ENDOSCOPIC GASTROSTOMY TUBE REPLACEMENT);  Surgeon: [name], MD;  Location: [facility];  Service: Gastroenterology;  Laterality: N/A;  . HEMORRHOID SURGERY  ~ 2010  . INSERTION OF MESH N/A 06/02/2015   Procedure: INSERTION OF MESH;  Surgeon: [name], MD;  Location: [facility];  Service: General;  Laterality: N/A;  . IR ANGIO INTRA EXTRACRAN SEL COM CAROTID INNOMINATE BILAT MOD SED  08/02/2018  . IR  ANGIO VERTEBRAL SEL VERTEBRAL BILAT MOD SED  08/02/2018  . IR REPLACE G-TUBE SIMPLE WO FLUORO  08/12/2019  . IR REPLC GASTRO/COLONIC TUBE PERCUT W/FLUORO  08/13/2019  . LAPAROSCOPIC INSERTION GASTROSTOMY TUBE N/A 02/24/2019   Procedure: LAPAROSCOPIC INSERTION GASTROSTOMY TUBE;  Surgeon: [name], MD;  Location: [facility];  Service: General;  Laterality: N/A;  . LEFT HEART CATHETERIZATION WITH CORONARY ANGIOGRAM N/A 10/30/2012   Procedure: LEFT HEART CATHETERIZATION WITH CORONARY ANGIOGRAM;  Surgeon: [name], MD;  Location: [facility];  Service: Cardiovascular;  Laterality: N/A;  . MULTIPLE EXTRACTIONS WITH ALVEOLOPLASTY N/A 02/18/2019   Procedure: Extraction of tooth #'s 5-7, 10,11,14,20-29, 31 and 32 with alveoloplasty and maxiillary left buccal exostoses reductions;  Surgeon: [name], [name];  Location: [facility];  Service: Oral Surgery;  Laterality: N/A;  . PERCUTANEOUS CORONARY ROTOBLATOR INTERVENTION (PCI-R) N/A 11/05/2012   Procedure: PERCUTANEOUS CORONARY ROTOBLATOR INTERVENTION (PCI-R);  Surgeon: [name], MD;  Location: [facility] CATH [facility];  Service: Cardiovascular;  Laterality: N/A;  . TRACHEOSTOMY TUBE PLACEMENT N/A 02/18/2019   Procedure: Tracheostomy;  Surgeon: [name], MD;  Location: [facility];  Service: ENT;  Laterality: N/A;  . UMBILICAL HERNIA REPAIR N/A 06/02/2015   Procedure: UMBILICAL HERNIORRHAPHY WITH MESH;  Surgeon: [name], MD;  Location: [facility];  Service: General;  Laterality: N/A;   No current facility-administered medications for this encounter.   Allergies  Allergen Reactions  . Duloxetine Other (See Comments)    Caused depression  .  Prednisone Other (See Comments)    Chest pain   Family History  Problem Relation Age of Onset  . Hypertension Mother   . Heart disease Father   . Hypertension Father   . Heart disease Sister   . Hypertension Maternal Grandmother   . Hypertension Maternal Grandfather   . Hypertension Paternal Grandmother   .  Hypertension Paternal Grandfather   . Pancreatic cancer Maternal Uncle   . Breast cancer Maternal Aunt   . Breast cancer Maternal Aunt   . Diabetes Paternal Uncle   . Diabetes Paternal Aunt    Social History   Socioeconomic History  . Marital status: Married    Spouse name: [name]  . Number of children: 0  . Years of education: Not on file  . Highest education level: Not on file  Occupational History  . Occupation: Disabled    Comment: previously worked in Nurse, learning disability as an Investment banker, corporate  . Smoking status: Former Smoker    Packs/day: 3.00    Years: 32.00    Pack years: 96.00    Types: Cigarettes    Start date: 08/29/1977    Quit date: 11/29/2006    Years since quitting: 12.9  . Smokeless tobacco: Never Used  . Tobacco comment: 11/05/2012 "chewed tobacco when I play ball; aien't chewed since age 37"  Vaping Use  . Vaping Use: Never used  Substance and Sexual Activity  . Alcohol use: Not Currently    Alcohol/week: 12.0 standard drinks    Types: 12 Cans of beer per week    Comment: None since 02/2019   . Drug use: Not Currently    Types: Marijuana    Comment: denies on 04/08/19  . Sexual activity: Not Currently  Other Topics Concern  . Not on file  Social History Narrative   Patient is disabled.  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  Patient previously 3 pack/day use.   Patient currently drinking 3-4 beers per day from 12 pack a day.   Patient denies use of chew or other illicit drugs.   Social Determinants of Health   Financial Resource Strain: Low Risk   . Difficulty of Paying Living Expenses: Not hard at all  Food Insecurity: No Food Insecurity  . Worried About Charity fundraiser in the Last Year: Never true  . Ran Out of Food in the Last Year: Never true  Transportation Needs: No Transportation Needs  . Lack of Transportation (Medical): No  .  Lack of Transportation (Non-Medical): No  Physical Activity: Insufficiently Active  . Days of Exercise per Week: 2 days  . Minutes of Exercise per Session: 30 min  Stress: Stress Concern Present  . Feeling of Stress : To some extent  Social Connections: Socially Isolated  . Frequency of Communication with Friends and Family: Once a week  . Frequency of Social Gatherings with Friends and Family: Once a week  . Attends Religious Services: Never  . Active Member of Clubs or Organizations: No  . Attends Archivist Meetings: Never  . Marital Status: Married  Human resources officer Violence: Not At Risk  . Fear of Current or Ex-Partner: No  . Emotionally Abused: No  . Physically Abused: No  . Sexually Abused: No    Physical Exam: Vital signs in last 24 hours:     GEN: NAD EYE: Sclerae anicteric ENT: MMM  CV: Non-tachycardic GI: Soft, NT/ND NEURO:  Alert & Oriented x 3  Lab Results: No results for input(s): WBC, HGB, HCT, PLT in the last 72 hours. BMET No results for input(s): NA, K, CL, CO2, GLUCOSE, BUN, CREATININE, CALCIUM in the last 72 hours. LFT No results for input(s): PROT, ALBUMIN, AST, ALT, ALKPHOS, BILITOT, BILIDIR, IBILI in the last 72 hours. PT/INR No results for input(s): LABPROT, INR in the last 72 hours.   Impression / Plan: This is a 59 y.o.male who presents for EGD with dilation attempt with history of Radiation for Head/Neck Cancer.  Possible colonoscopy if preparation completed.    We may try to remove the G-tube in effort of going retrograde to see the esophagus to see if dilation or wire can be placed.    The risks and benefits of endoscopic evaluation were discussed with the patient; these include but are not limited to the risk of perforation, infection, bleeding, missed lesions, lack of diagnosis, severe illness requiring hospitalization, as well as anesthesia and sedation related illnesses.  The patient is agreeable to proceed.    Justice Britain, MD [facility] Office # [phone]

## 2019-11-24 NOTE — Anesthesia Preprocedure Evaluation (Addendum)
Anesthesia Evaluation  Patient identified by MRN, date of birth, ID band Patient awake    Reviewed: Allergy & Precautions, NPO status , Patient's Chart, lab work & pertinent test results  Airway Mallampati: IV  TM Distance: >3 FB Neck ROM: Full    Dental  (+) Edentulous Lower, Edentulous Upper   Pulmonary former smoker,  S/p tracheostomy   Pulmonary exam normal breath sounds clear to auscultation       Cardiovascular hypertension, Pt. on medications and Pt. on home beta blockers + CAD and + Cardiac Stents  Normal cardiovascular exam Rhythm:Regular Rate:Normal  ECG: SR, rate 94  ECHO: 1. Left ventricular ejection fraction, by estimation, is 60 to 65%. The left ventricle has normal function. The left ventricle has no regional wall motion abnormalities. There is mild left ventricular hypertrophy. Left ventricular diastolic parameters are consistent with Grade I diastolic dysfunction (impaired relaxation). 2. Right ventricular systolic function is normal. The right ventricular size is normal. 3. The mitral valve is normal in structure. No evidence of mitral valve regurgitation. No evidence of mitral stenosis. 4. The aortic valve is tricuspid. Aortic valve regurgitation is mild. No aortic stenosis is present. 5. Aortic dilatation noted. There is moderate dilatation of the aortic root, measuring 44 mm. 6. The inferior vena cava is normal in size with greater than 50% respiratory variability, suggesting right atrial pressure of 3 mmHg.   Neuro/Psych PSYCHIATRIC DISORDERS Anxiety Depression CVA (Left leg weakness), Residual Symptoms    GI/Hepatic Neg liver ROS, hiatal hernia, Bowel prep,GERD  Medicated and Controlled,  Endo/Other  negative endocrine ROS  Renal/GU negative Renal ROS     Musculoskeletal  (+) Arthritis , Chronic lower back pain Ambulates with cane   Abdominal   Peds  Hematology HLD   Anesthesia Other  Findings supraglottis cancer  screening colonoscopy  Reproductive/Obstetrics                            Anesthesia Physical Anesthesia Plan  ASA: III  Anesthesia Plan: MAC   Post-op Pain Management:    Induction: Intravenous  PONV Risk Score and Plan: 1 and Propofol infusion and Treatment may vary due to age or medical condition  Airway Management Planned: Nasal Cannula  Additional Equipment:   Intra-op Plan:   Post-operative Plan:   Informed Consent: I have reviewed the patients History and Physical, chart, labs and discussed the procedure including the risks, benefits and alternatives for the proposed anesthesia with the patient or authorized representative who has indicated his/her understanding and acceptance.     Dental advisory given  Plan Discussed with: CRNA  Anesthesia Plan Comments:         Anesthesia Quick Evaluation

## 2019-11-24 NOTE — Op Note (Signed)
[facility] Patient Name: [name] Procedure Date : 11/24/2019 MRN: [id number] Attending MD: Justice Britain , MD Date of Birth: January 23, 1960 CSN: [id number] Age: 59 Admit Type: Outpatient Procedure:                Colonoscopy Indications:              Screening for colorectal malignant neoplasm,                            Incidental diarrhea noted Providers:                Justice Britain, MD, Baird Cancer, RN, Laverda Sorenson, Technician Referring MD:             [name], MD, Willia Craze,                            [name], MD Medicines:                Monitored Anesthesia Care Complications:            No immediate complications. Estimated Blood Loss:     Estimated blood loss was minimal. Procedure:                Pre-Anesthesia Assessment:                           - Prior to the procedure, a History and Physical                            was performed, and patient medications and                            allergies were reviewed. The patient's tolerance of                            previous anesthesia was also reviewed. The risks                            and benefits of the procedure and the sedation                            options and risks were discussed with the patient.                            All questions were answered, and informed consent                            was obtained. Prior Anticoagulants: The patient has                            taken no previous anticoagulant or antiplatelet                            agents except for  aspirin. ASA Grade Assessment:                            III - A patient with severe systemic disease. After                            reviewing the risks and benefits, the patient was                            deemed in satisfactory condition to undergo the                            procedure.                           After obtaining informed consent,  the colonoscope                            was passed under direct vision. Throughout the                            procedure, the patient's blood pressure, pulse, and                            oxygen saturations were monitored continuously. The                            PCF-H190DL ([id number]) Olympus pediatric colonoscope                            was introduced through the anus and advanced to the                            the cecum, identified by appendiceal orifice and                            ileocecal valve. The colonoscopy was performed                            without difficulty. The patient tolerated the                            procedure. The quality of the bowel preparation was                            adequate. The ileocecal valve, appendiceal orifice,                            and rectum were photographed. Scope In: 1:50:30 PM Scope Out: 2:06:28 PM Scope Withdrawal Time: 0 hours 11 minutes 49 seconds  Total Procedure Duration: 0 hours 15 minutes 58 seconds  Findings:      The digital rectal exam findings include hemorrhoids. Pertinent       negatives include no palpable rectal lesions.      Normal mucosa was  found in the entire colon. Biopsies for histology were       taken with a cold forceps from the entire colon for evaluation of       microscopic colitis.      Non-bleeding non-thrombosed external and internal hemorrhoids were found       during retroflexion, during perianal exam and during digital exam. The       hemorrhoids were Grade II (internal hemorrhoids that prolapse but reduce       spontaneously). Impression:               - Hemorrhoids found on digital rectal exam.                           - Normal mucosa in the entire examined colon.                            Biopsied.                           - Non-bleeding non-thrombosed external and internal                            hemorrhoids. Recommendation:           - Proceed to scheduled EGD.                            - Await pathology results.                           - Repeat colonoscopy in likely 10 years for                            screening purposes.                           - The findings and recommendations were discussed                            with the patient.                           - The findings and recommendations were discussed                            with the patient's family. Procedure Code(s):        --- Professional ---                           [phone], Colonoscopy, flexible; with biopsy, single                            or multiple Diagnosis Code(s):        --- Professional ---                           Z12.11, Encounter for screening for malignant  neoplasm of colon                           K64.1, Second degree hemorrhoids CPT copyright 2019 American [employer]. All rights reserved. The codes documented in this report are preliminary and upon coder review may  be revised to meet current compliance requirements. Justice Britain, MD 11/24/2019 2:59:04 PM Number of Addenda: 0

## 2019-11-24 NOTE — Op Note (Addendum)
[facility] Patient Name: [name] Procedure Date : 11/24/2019 MRN: [id number] Attending MD: Justice Britain , MD Date of Birth: 05/04/1960 CSN: [id number] Age: 59 Admit Type: Outpatient Procedure:                Upper GI endoscopy Indications:              Dysphagia, Postchemotherapy assessment; Anorexia Providers:                Justice Britain, MD, Baird Cancer, RN, Laverda Sorenson, Technician Referring MD:             [name], MD, Willia Craze,                            [name], MD Medicines:                Monitored Anesthesia Care Complications:            No immediate complications. Estimated Blood Loss:     Estimated blood loss was minimal. Procedure:                Pre-Anesthesia Assessment:                           - Prior to the procedure, a History and Physical                            was performed, and patient medications and                            allergies were reviewed. The patient's tolerance of                            previous anesthesia was also reviewed. The risks                            and benefits of the procedure and the sedation                            options and risks were discussed with the patient.                            All questions were answered, and informed consent                            was obtained. Prior Anticoagulants: The patient has                            taken no previous anticoagulant or antiplatelet                            agents except for aspirin. ASA Grade Assessment:  III - A patient with severe systemic disease. After                            reviewing the risks and benefits, the patient was                            deemed in satisfactory condition to undergo the                            procedure.                           After obtaining informed consent, the endoscope was                             passed under direct vision. Throughout the                            procedure, the patient's blood pressure, pulse, and                            oxygen saturations were monitored continuously. The                            [id number]-[id number] ([id number]) Olympus slim endoscope was                            introduced through the mouth, and advanced to the                            second part of duodenum. The upper GI endoscopy was                            technically difficult and complex due to stricture.                            Successful completion of the procedure was aided by                            performing the maneuvers documented (below) in this                            report. The patient tolerated the procedure. Scope In: Scope Out: Findings:      Erythema and swelling was found diffusely, throughout the laryngeal       region.      One benign-appearing, intrinsic severe (circumferential scarring)       stenosis was found at 18 cm from the incisors. This stenosis measured 5       mm (inner diameter) x 1 cm (in length). The stenosis was traversed after       gentle pressure with the Olympus baby endoscope (5.8 mm). After the rest       of the EGD was complete, a guidewire was placed under fluoroscopic       guidance and the scope  was withdrawn. Dilation was performed with a       Savary dilator with mild resistance at 7 mm and moderate resistance at 8       mm. The dilation site was examined following endoscope reinsertion and       showed moderate mucosal disruption, moderate improvement in luminal       narrowing and no perforation. The scope passed with ease thereafter.      No other gross lesions were noted in the entire esophagus. Biopsies were       taken with a cold forceps for histology to rule out EoE.      There was evidence of an intact gastrostomy with a patent G-tube present       on the anterior wall of the gastric body with the G-tube being        malpositioned. I pulled the G-tube back by about 10 cm and placed the       bumper to this region to secure it. The area under the bumper was       characterized by healthy appearing mucosa.      No other gross lesions were noted in the entire examined stomach.       Biopsies were taken with a cold forceps for histology and Helicobacter       pylori testing.      No gross lesions were noted in the duodenal bulb, in the first portion       of the duodenum and in the second portion of the duodenum. Biopsies for       histology were taken with a cold forceps for evaluation of celiac       disease. Impression:               - Erythema was visualized diffusely, throughout the                            laryngeal region - complete visualization not                            possible with my endoscope today.                           - Benign-appearing severe esophageal stenosis at 18                            cm. Dilated with passage of endoscope and then with                            Savary dilators under fluoroscopy with improvement                            in narrowing post-dilation.                           - No other gross lesions in esophagus. Biopsied.                           - Intact gastrostomy with a patent G-tube present -  pulled back as much of tube was in the stomach and                            the bumper was cinched.                           - No other gross lesions in the stomach. Biopsied.                           - No gross lesions in the duodenal bulb, in the                            first portion of the duodenum and in the second                            portion of the duodenum. Biopsied. Recommendation:           - The patient will be observed post-procedure,                            until all discharge criteria are met.                           - Discharge patient to home.                           - Clear liquids for 48 hours  and then if able can                            advance to full liquids for 48 hours and then can                            try to see if soft/pureed food could be possible to                            pass, but likely best to maintain current status at                            only having liquids for now.                           - Omeprazole 40 mg twice daily (will send new Rx).                           - Please use Cepacol or Halls Lozenges +/-                            Chloraseptic spray for next 72-96 hours to aid in                            sore thoat should you experience this.                           -  Await pathology results.                           - Repeat upper endoscopy in 3-4 weeks for                            retreatment and repeat dilation.                           - The findings and recommendations were discussed                            with the patient.                           - The findings and recommendations were discussed                            with the patient's family. Procedure Code(s):        --- Professional ---                           [phone], Esophagogastroduodenoscopy, flexible,                            transoral; with insertion of guide wire followed by                            passage of dilator(s) through esophagus over guide                            wire Diagnosis Code(s):        --- Professional ---                           J38.7, Other diseases of larynx                           K22.2, Esophageal obstruction                           Z93.1, Gastrostomy status                           R13.10, Dysphagia, unspecified                           V95, Encounter for follow-up examination after                            completed treatment for conditions other than                            malignant neoplasm CPT copyright 2019 American Medical Association. All rights reserved. The codes documented in this report are  preliminary and upon coder review may  be revised to meet current compliance requirements. Justice Britain, MD 11/24/2019 3:15:17 PM Number of Addenda: 0

## 2019-11-24 NOTE — Discharge Instructions (Signed)
YOU HAD AN ENDOSCOPIC PROCEDURE TODAY: Refer to the procedure report and other information in the discharge instructions given to you for any specific questions about what was found during the examination. If this information does not answer your questions, please call [place] office at [phone] to clarify.  ° °YOU SHOULD EXPECT: Some feelings of bloating in the abdomen. Passage of more gas than usual. Walking can help get rid of the air that was put into your GI tract during the procedure and reduce the bloating. If you had a lower endoscopy (such as a colonoscopy or flexible sigmoidoscopy) you may notice spotting of blood in your stool or on the toilet paper. Some abdominal soreness may be present for a day or two, also. ° °DIET: Your first meal following the procedure should be a light meal and then it is ok to progress to your normal diet. A half-sandwich or bowl of soup is an example of a good first meal. Heavy or fried foods are harder to digest and may make you feel nauseous or bloated. Drink plenty of fluids but you should avoid alcoholic beverages for 24 hours. If you had a esophageal dilation, please see attached instructions for diet.   ° °ACTIVITY: Your care partner should take you home directly after the procedure. You should plan to take it easy, moving slowly for the rest of the day. You can resume normal activity the day after the procedure however YOU SHOULD NOT DRIVE, use power tools, machinery or perform tasks that involve climbing or major physical exertion for 24 hours (because of the sedation medicines used during the test).  ° °SYMPTOMS TO REPORT IMMEDIATELY: °A gastroenterologist can be reached at any hour. Please call [phone]  for any of the following symptoms:  °Following lower endoscopy (colonoscopy, flexible sigmoidoscopy) °Excessive amounts of blood in the stool  °Significant tenderness, worsening of abdominal pains  °Swelling of the abdomen that is new, acute  °Fever of 100° or  higher  °Following upper endoscopy (EGD, EUS, ERCP, esophageal dilation) °Vomiting of blood or coffee ground material  °New, significant abdominal pain  °New, significant chest pain or pain under the shoulder blades  °Painful or persistently difficult swallowing  °New shortness of breath  °Black, tarry-looking or red, bloody stools ° °FOLLOW UP:  °If any biopsies were taken you will be contacted by phone or by letter within the next 1-3 weeks. Call [phone]  if you have not heard about the biopsies in 3 weeks.  °Please also call with any specific questions about appointments or follow up tests. ° °

## 2019-11-25 ENCOUNTER — Other Ambulatory Visit: Payer: Self-pay | Admitting: *Deleted

## 2019-11-25 NOTE — Patient Outreach (Signed)
Jorge [facility] Care Management  11/25/2019  [name] 1960-03-03 [id number]  Telephone outreach for follow up on pt diagnostic procedures.  [name] reports the doctor told them that the colonoscopy did not reveal any worrisome results, that it appears clear. They did take some tissue biopsy samples due to his complaints of frequent diarrhea. [name] evidently endured the procedure without problems. [name] said she was in a MD office and requested that she call me back.  [name]. Jorge Neither, MSN, [facility] Gerontological Nurse Practitioner [facility] Care Management [phone]

## 2019-11-26 ENCOUNTER — Other Ambulatory Visit: Payer: Self-pay | Admitting: Physician Assistant

## 2019-11-26 ENCOUNTER — Encounter (HOSPITAL_COMMUNITY): Payer: Self-pay | Admitting: Gastroenterology

## 2019-11-26 LAB — SURGICAL PATHOLOGY

## 2019-11-28 ENCOUNTER — Encounter: Payer: Self-pay | Admitting: Gastroenterology

## 2019-12-01 ENCOUNTER — Other Ambulatory Visit: Payer: Self-pay

## 2019-12-01 DIAGNOSIS — C321 Malignant neoplasm of supraglottis: Secondary | ICD-10-CM

## 2019-12-02 ENCOUNTER — Ambulatory Visit (HOSPITAL_COMMUNITY): Payer: 59 | Admitting: Speech Pathology

## 2019-12-03 ENCOUNTER — Inpatient Hospital Stay: Payer: 59 | Attending: Radiation Oncology

## 2019-12-03 ENCOUNTER — Other Ambulatory Visit: Payer: Self-pay

## 2019-12-03 ENCOUNTER — Other Ambulatory Visit: Payer: Self-pay | Admitting: *Deleted

## 2019-12-03 NOTE — Progress Notes (Signed)
Nutrition Follow-up:  Patient with supraglottic carcinoma.  Patient has completed chemotherapy and radiation therapy (05/08/19).   Spoke with patient's wife via phone.  Wife reports that patient had esophagus stretched on 11/22 and he is swallowing some better.  Reports that his throat was sore for a little bit after the procedure and did more tube feeding than eating solid foods.  Reports that patient wants to go eat some fried chicken (pulls the skin off), okra and mashed potatoes today for lunch.  Had this recently.  Wife asking about when he can get dentures made.  Has not tried carnation instant breakfast shakes.  Wife says that patient is doing 3 cartons of osmolite 1.5 some days and other days not giving any tube feeding.    Noted medical oncology planning follow-up CT scan.    Medications: reviewed  Labs: reviewed  Anthropometrics:   Weight 153 lb noted on 11/22 decreased from 157 lb on 10/27   NUTRITION DIAGNOSIS: Predicted suboptimal energy intake improving    INTERVENTION:  Continue tube feeding of osmolite 1.5 per day.  Wife feels like once patient about to have dentures will be able to eat more.   Noted in RN navigators note patient can have dentures fabricated 6 months after the completion of radiation.  Discussed this recommendation with wife.  Encouraged follow-up with SLP.   Encouraged high calorie, high protein foods to prevent weight loss.     MONITORING, EVALUATION, GOAL: weight trends, intake, tube feeding   NEXT VISIT: Dec 29, phone f/u  Jearldine Cassady B. [name], [place], [place] Registered Dietitian [phone] (mobile)

## 2019-12-03 NOTE — Patient Outreach (Signed)
[facility]) Care Management  12/03/2019  TEJUAN GHOLSON November 10, 1960 [id number]  Telephone outreach. Spoke with Mrs. Belvedere.  Mr. Mansel had his esophagus stretched a week ago and this has enabled him to eat more by mouth. This has helped his outlook in general. He will have to have this done again in about 4 weeks.  He has had a nutritional consultation. Wt down to 153. It is hopeful that his wt will increase as he will be able to start eating more.  Goals    . Manage My Emotions     Follow Up Date 03/02/20   Why is this important?   When you are stressed, down or upset, your body reacts too.  For example, your blood pressure may get higher; you may have a headache or stomachache.  When your emotions get the best of you, your body's ability to fight off cold and flu gets weak.  These steps will help you manage your emotions.     Notes: Pt will call and get appt for evaluation of depression by the end of 30 days. 11/11/19 It does not appear that primary care has addressed pt depression or given new medication for same. Encouraged him to try to do some of the things he enjoys like getting outside and getting fresh air, taking a walk, listen to music. 12/03/19 Pt outlook improved due to ability to eat more and having family around on Thanksgiving.    . Mat[place]ain My Quality of Life     Follow Up Date  03/02/20   Why is this important?   Having a long-term illness can be scary.  It can also be stressful for you and your caregiver.  These steps may help.    Notes: Strive to exercise for at least 10 minutes everyday. Walking outdoors when weather permitting would be good for all around health improvement. Talk to your NP or MD as often as necessary and resume friendships that may have been on hold due to your treatment and COVID 19. 11/11/19 Pt sleeping most of the day, not doing usual activities he used to enjoy, very depressed. Wants to live out his life doing things, like drinking  again as this is something that he values for his quality of life. Counseled that if there is ca and he doesn't want treatment, he will be free to do what he wants to do. The priority would be his safety and comfort. 12/03/19 Wife reports she feels he seems to be more hopeful since our last conversation. Encouraged outdoors activities with friends and family.    . Patient Stated     Mr. Chestnutt agrees to participate with [place] for support and ongoing health needs. 09/03/19 Talked with Mrs. Dhondt today, pt ca recovery progressing, trach removed, scheduled for ST and other diagnostics this month. No problems for resolution today. 10/06/19 Talked with Mr. Shakoor reports continued depression, advised to made OV with primary care to assess and treat. Improving PO intake. CCS 11/11/19 - Pt and wife are worried about ca reoccurrence or mets. Pt exhibits continued signs of depression. States he would not want any further txs. Listened and supported him. 12/03/19 No signs of ca reoccurrence. Will touch base in another 3 months. Encouraged to call for questions and or support.      Eulah Pont. Myrtie Neither, MSN, [facility] Gerontological Nurse Practitioner [facility] Care Management [phone]

## 2019-12-22 ENCOUNTER — Ambulatory Visit: Payer: 59 | Admitting: Adult Health

## 2019-12-24 ENCOUNTER — Encounter (HOSPITAL_COMMUNITY): Payer: Self-pay | Admitting: Gastroenterology

## 2019-12-24 ENCOUNTER — Telehealth: Payer: Self-pay | Admitting: Gastroenterology

## 2019-12-24 NOTE — Telephone Encounter (Signed)
The pt has been advised that 12/27 is the correct appt for EGD.  COVID test to be completed on 12/23.  All questions answered.  Pt verbalized understanding.

## 2019-12-24 NOTE — Progress Notes (Addendum)
PCP - Dr [name] Cardiologist - Dr Rickey Barbara - Dr Mansouraty Neuro - Frann Rider, NP Oncology - Dr [name]  Chest x-ray - 04/08/19 (1V), CT Angio Chest 10/20/19 EKG - 04/08/19 Stress Test - 09/13/12 ECHO - 10/24/19 Cardiac Cath - 10/30/12  Aspirin Instructions: Follow your surgeon's instructions on when to stop aspirin prior to surgery,  If no instructions were given by your surgeon then you will need to call the office for those instructions.  Anesthesia review: Yes  STOP now taking any Aspirin (unless otherwise instructed by your surgeon), Aleve, Naproxen, Ibuprofen, Motrin, Advil, Goody's, BC's, all herbal medications, fish oil, and all vitamins.   Coronavirus Screening Covid test scheduled on 12/25/19. Do you have any of the following symptoms:  Cough yes/no: No Fever (>100.35F)  yes/no: No Runny nose yes/no: No Sore throat yes/no: No Difficulty breathing/shortness of breath  yes/no: No  Have you traveled in the last 14 days and where? yes/no: No  Spoke with Patient and Wife [name].  [name] verbalized understanding of instructions that were given via phone.

## 2019-12-25 ENCOUNTER — Other Ambulatory Visit (HOSPITAL_COMMUNITY): Payer: 59

## 2019-12-25 ENCOUNTER — Telehealth: Payer: Self-pay | Admitting: Gastroenterology

## 2019-12-25 NOTE — Telephone Encounter (Signed)
Pt Is requesting a call back from a nurse to discuss rescheduling his EGD and covid test.

## 2019-12-25 NOTE — Telephone Encounter (Signed)
Spoke with the patient. He wants to wait until after the holiday for his procedure at [facility] with Dr Rush Landmark.  Case moved to the depot for rescheduling.

## 2019-12-29 NOTE — Telephone Encounter (Signed)
The pt has been changed to 01/19/20 at 130 pm at [facility] with Dr Jerilynn Mages.  Covid test changed to 1/13 at 1020 am.  The pt has been rescheduled and instructed.

## 2019-12-30 ENCOUNTER — Telehealth: Payer: Self-pay

## 2019-12-30 NOTE — Telephone Encounter (Signed)
Contacted patient to verify phone visit for pre reg °

## 2019-12-31 ENCOUNTER — Inpatient Hospital Stay: Payer: 59

## 2019-12-31 NOTE — Progress Notes (Signed)
Nutrition Follow-up:  Patient with supraglottic carcinoma.  Patient has completed radiation and chemotherapy (05/08/19).    Spoke with patient's wife this afternoon for nutrition follow-up.  Wife says that patient is asleep.  Reports that she does not think he is using feeding tube anymore or not very much.  Reports that he got new teeth about a month ago and is eating more solid foods (fried chicken) and TV dinners, candy bars, pinto beans, gravy biscuit.      Medications: reviewed  Labs: reviewed  Anthropometrics:   Weight 145 lb on 12/22 decreased from 153 lb on 11/22  157 lb on 10/27   NUTRITION DIAGNOSIS: Predicted sub optimal energy intake improving    INTERVENTION:  Concerned about weight loss.  Discussed with wife importance of receiving adequate nutrition to maintain weight. Also concerned that patient has not followed back up with SLP following EGD in November.  Will reach out to SLP.   Recommend if patient not going to use feeding tube must flush with 60ml of water daily to keep patent until removed. Wife reports that patient wants feeding tube out. Discussed importance of patient being able to maintain weight and eat orally and safely for about 4 weeks before tube can be removed.  Wife has contact information    MONITORING, EVALUATION, GOAL:  Weight trends, intake, tube feeding   NEXT VISIT: Feb 2 phone f/u  [name], [place], [place] Registered Dietitian [phone] (mobile)

## 2020-01-15 ENCOUNTER — Encounter (HOSPITAL_COMMUNITY): Payer: Self-pay | Admitting: Gastroenterology

## 2020-01-15 ENCOUNTER — Other Ambulatory Visit: Payer: Self-pay

## 2020-01-15 ENCOUNTER — Other Ambulatory Visit (HOSPITAL_COMMUNITY): Payer: 59

## 2020-01-15 NOTE — Progress Notes (Signed)
Spoke with pt and his wife, [name] for pre-op call. Pt forgot about his Covid test appt today, he will go in the AM at 9 to get it done. Pt has hx of CAD, no recent chest pain or shortness of breath. Pt is not diabetic.    Pt states he understands that he has to be in quarantine after the test is done and understands that he stays in quarantine until he comes to the hospital on Monday.  EKG - 04/08/19 CXR - 04/08/19 ([facility]view) Echo - 10/24/19

## 2020-01-16 ENCOUNTER — Telehealth: Payer: Self-pay

## 2020-01-16 ENCOUNTER — Other Ambulatory Visit (HOSPITAL_COMMUNITY)
Admission: RE | Admit: 2020-01-16 | Discharge: 2020-01-16 | Disposition: A | Discharge status: Home / Self Care | Payer: 59 | Source: Ambulatory Visit | Attending: Gastroenterology | Admitting: Gastroenterology

## 2020-01-16 DIAGNOSIS — Z01812 Encounter for preprocedural laboratory examination: Secondary | ICD-10-CM | POA: Insufficient documentation

## 2020-01-16 DIAGNOSIS — Z20822 Contact with and (suspected) exposure to covid-19: Secondary | ICD-10-CM | POA: Diagnosis not present

## 2020-01-16 LAB — SARS CORONAVIRUS 2 (TAT 6-24 HRS): SARS Coronavirus 2: NEGATIVE

## 2020-01-16 NOTE — Telephone Encounter (Signed)
reached the pt to discuss case for Monday in the event we have snow. Per Dr Rush Landmark he will be at the hospital for his cases and the patients may remain as planned.  The pt wants to remain on the schedule as planned. He will call if he can not make it on Monday.

## 2020-01-17 NOTE — Telephone Encounter (Signed)
Thank you for update. GM 

## 2020-01-19 ENCOUNTER — Telehealth: Payer: Self-pay

## 2020-01-19 ENCOUNTER — Ambulatory Visit (HOSPITAL_COMMUNITY): Admission: RE | Admit: 2020-01-19 | Payer: 59 | Source: Home / Self Care | Admitting: Gastroenterology

## 2020-01-19 HISTORY — DX: Anemia, unspecified: D64.9

## 2020-01-19 SURGERY — ESOPHAGOGASTRODUODENOSCOPY (EGD) WITH PROPOFOL
Anesthesia: Monitor Anesthesia Care

## 2020-01-19 NOTE — Telephone Encounter (Signed)
-----   Message from Irving Copas., MD sent at 01/19/2020 10:28 AM EST ----- Regarding: RE: cancellation 01/19/20 Thank you for update. Modestine Scherzinger, please schedule as soon as able. If needed, can be placed on my hospital week. Thanks. GM ----- Message ----- From: Cheri Guppy, RN Sent: 01/18/2020  10:23 AM EST To: Irving Copas., MD Subject: cancellation 01/19/20                           This patient wants to reschedule his appointment tomorrow for the soonest date he can after this weather is over. I will cancel the case. Please have your office reschedule him. Thanks, Almyra Free [facility] Endo RN

## 2020-01-20 ENCOUNTER — Ambulatory Visit: Payer: 59 | Admitting: Cardiology

## 2020-01-20 NOTE — Telephone Encounter (Signed)
The pt has been rescheduled to 1/24 at [facility] at 1030 am.  He has been re instructed and will have COVID test on 1/20.  He will call if he has any questions or concerns

## 2020-01-22 ENCOUNTER — Other Ambulatory Visit (HOSPITAL_COMMUNITY)
Admission: RE | Admit: 2020-01-22 | Discharge: 2020-01-22 | Disposition: A | Discharge status: Home / Self Care | Payer: 59 | Source: Ambulatory Visit | Attending: Gastroenterology | Admitting: Gastroenterology

## 2020-01-22 DIAGNOSIS — Z01812 Encounter for preprocedural laboratory examination: Secondary | ICD-10-CM | POA: Diagnosis present

## 2020-01-22 DIAGNOSIS — Z20822 Contact with and (suspected) exposure to covid-19: Secondary | ICD-10-CM | POA: Insufficient documentation

## 2020-01-22 LAB — SARS CORONAVIRUS 2 (TAT 6-24 HRS): SARS Coronavirus 2: NEGATIVE

## 2020-01-23 NOTE — Progress Notes (Signed)
I call Mr Baig and left a voice message.  I asked [name] to call me back if he has had any changes in health.  I also instructed patient to arrive at 0900.

## 2020-01-26 ENCOUNTER — Ambulatory Visit (HOSPITAL_COMMUNITY): Payer: 59 | Admitting: Certified Registered"

## 2020-01-26 ENCOUNTER — Other Ambulatory Visit: Payer: Self-pay

## 2020-01-26 ENCOUNTER — Ambulatory Visit (HOSPITAL_COMMUNITY): Payer: 59

## 2020-01-26 ENCOUNTER — Encounter (HOSPITAL_COMMUNITY): Payer: Self-pay | Admitting: Gastroenterology

## 2020-01-26 ENCOUNTER — Ambulatory Visit (HOSPITAL_COMMUNITY)
Admission: RE | Admit: 2020-01-26 | Discharge: 2020-01-26 | Disposition: A | Discharge status: Home / Self Care | Payer: 59 | Attending: Gastroenterology | Admitting: Gastroenterology

## 2020-01-26 ENCOUNTER — Encounter (HOSPITAL_COMMUNITY)
Admission: RE | Disposition: A | Discharge status: Home / Self Care | Payer: Self-pay | Source: Home / Self Care | Attending: Gastroenterology

## 2020-01-26 DIAGNOSIS — K9423 Gastrostomy malfunction: Secondary | ICD-10-CM | POA: Diagnosis not present

## 2020-01-26 DIAGNOSIS — Z955 Presence of coronary angioplasty implant and graft: Secondary | ICD-10-CM | POA: Diagnosis not present

## 2020-01-26 DIAGNOSIS — I69354 Hemiplegia and hemiparesis following cerebral infarction affecting left non-dominant side: Secondary | ICD-10-CM | POA: Insufficient documentation

## 2020-01-26 DIAGNOSIS — R1310 Dysphagia, unspecified: Secondary | ICD-10-CM | POA: Diagnosis not present

## 2020-01-26 DIAGNOSIS — Z87891 Personal history of nicotine dependence: Secondary | ICD-10-CM | POA: Insufficient documentation

## 2020-01-26 DIAGNOSIS — K222 Esophageal obstruction: Secondary | ICD-10-CM | POA: Diagnosis not present

## 2020-01-26 DIAGNOSIS — Y833 Surgical operation with formation of external stoma as the cause of abnormal reaction of the patient, or of later complication, without mention of misadventure at the time of the procedure: Secondary | ICD-10-CM | POA: Insufficient documentation

## 2020-01-26 DIAGNOSIS — Z79899 Other long term (current) drug therapy: Secondary | ICD-10-CM | POA: Diagnosis not present

## 2020-01-26 DIAGNOSIS — Z8521 Personal history of malignant neoplasm of larynx: Secondary | ICD-10-CM | POA: Diagnosis not present

## 2020-01-26 DIAGNOSIS — C801 Malignant (primary) neoplasm, unspecified: Secondary | ICD-10-CM

## 2020-01-26 DIAGNOSIS — Y842 Radiological procedure and radiotherapy as the cause of abnormal reaction of the patient, or of later complication, without mention of misadventure at the time of the procedure: Secondary | ICD-10-CM | POA: Insufficient documentation

## 2020-01-26 HISTORY — PX: SAVORY DILATION: SHX5439

## 2020-01-26 HISTORY — PX: PEG PLACEMENT: SHX5437

## 2020-01-26 HISTORY — PX: ESOPHAGOGASTRODUODENOSCOPY (EGD) WITH PROPOFOL: SHX5813

## 2020-01-26 SURGERY — ESOPHAGOGASTRODUODENOSCOPY (EGD) WITH PROPOFOL
Anesthesia: Monitor Anesthesia Care

## 2020-01-26 MED ORDER — PROPOFOL 500 MG/50ML IV EMUL
INTRAVENOUS | Status: DC | PRN
  Administered 2020-01-26: 125 ug/kg/min via INTRAVENOUS

## 2020-01-26 MED ORDER — OMEPRAZOLE 40 MG PO CPDR: 40.0000 mg | DELAYED_RELEASE_CAPSULE | Freq: Two times a day (BID) | ORAL | 5 refills | Status: DC

## 2020-01-26 MED ORDER — EPHEDRINE SULFATE 50 MG/ML IJ SOLN
INTRAMUSCULAR | Status: DC | PRN
  Administered 2020-01-26 (×3): 5 mg via INTRAVENOUS

## 2020-01-26 MED ORDER — LACTATED RINGERS IV SOLN: INTRAVENOUS | Status: DC | PRN

## 2020-01-26 MED ORDER — LACTATED RINGERS IV SOLN: INTRAVENOUS | Status: DC

## 2020-01-26 SURGICAL SUPPLY — 14 items

## 2020-01-26 NOTE — Op Note (Signed)
[facility] Patient Name: [name] Procedure Date : 01/26/2020 MRN: [id number] Attending MD: Justice Britain , MD Date of Birth: December 25, 1960 CSN: [id number] Age: 60 Admit Type: Outpatient Procedure:                Upper GI endoscopy Indications:              Dysphagia, Stricture of the esophagus, Follow-up of                            esophageal stricture, For therapy of esophageal                            stricture, Exchange PEG tube due to malfunctioning                            gastrostomy tube Providers:                Justice Britain, MD, Kary Kos RN, RN,                            Laverda Sorenson, Technician, Phill Myron. Proofreader,                            CRNA Referring MD:             [name], MD, Willia Craze,                            [name], MD Medicines:                Monitored Anesthesia Care Complications:            No immediate complications. Estimated Blood Loss:     Estimated blood loss was minimal. Procedure:                Pre-Anesthesia Assessment:                           - Prior to the procedure, a History and Physical                            was performed, and patient medications and                            allergies were reviewed. The patient's tolerance of                            previous anesthesia was also reviewed. The risks                            and benefits of the procedure and the sedation                            options and risks were discussed with the patient.                            All  questions were answered, and informed consent                            was obtained. Prior Anticoagulants: The patient has                            taken no previous anticoagulant or antiplatelet                            agents except for aspirin. ASA Grade Assessment:                            III - A patient with severe systemic disease. After                            reviewing  the risks and benefits, the patient was                            deemed in satisfactory condition to undergo the                            procedure.                           After obtaining informed consent, the endoscope was                            passed under direct vision. Throughout the                            procedure, the patient's blood pressure, pulse, and                            oxygen saturations were monitored continuously. The                            [id number]-[id number] ([id number]) Olympus Ultraslim gastroscope                            was introduced through the mouth, and advanced to                            the second part of duodenum. The upper GI endoscopy                            was accomplished without difficulty. The patient                            tolerated the procedure. Scope In: Scope Out: Findings:      One benign-appearing, intrinsic severe (stenosis; an endoscope cannot       pass) stenosis was found 17 to 19 cm from the incisors. This stenosis       measured 6 mm (inner diameter). The stenosis was traversed only with the  ultraslim endoscope. After the rest of the EGD was complete a guidewire       was placed under fluoroscopic guidance and the scope was withdrawn.       Dilation was performed with a Savary dilator with no resistance at 6 mm,       mild resistance at 8 mm and 9 mm and moderate resistance at 10 mm. The       dilation site was examined following endoscope reinsertion and showed       moderate mucosal disruption, moderate improvement in luminal narrowing       and no perforation. The ultraslim endoscope passed with ease.      No other gross lesions were noted in the entire esophagus.      There was evidence of a dislodged gastrostomy tube (multiple cm of tube       were in the stomach and the bulb was partially deflated - this was       present in the gastric body anterior wall. This was characterized by       healthy  appearing mucosa at the area of where the bumper/balloon would       be in place. The PEG required removal because it was leaking from tubing       on the outside region. The PEG was removed under endoscopic vision.       Removal was accomplished without difficulty. An endoscopically removable       18 Fr MIC gastrostomy tube was lubricated. The replacement tube was       placed using the existing gastrostomy port with ease. The final position       of the gastrostomy tube was confirmed by relook endoscopy, and skin       marking noted to be 2.5 cm at the external bumper. This was flushed. The       final tension and compression of the abdominal wall by the PEG tube and       external bumper were checked and revealed that the bumper was loose and       lightly touching the skin. The tube was capped, and the tube site was       cleaned and dressed.      No other gross lesions were noted in the entire examined stomach.      No gross lesions were noted in the duodenal bulb, in the first portion       of the duodenum and in the second portion of the duodenum. Impression:               - Benign-appearing esophageal stenosis from prior                            radiation treatment. Dilated up to 10 mm with                            significant mucosal wrenting being noted.                           - No other gross lesions in esophagus.                           - Dislodged gastrostomy tube present characterized  by healthy appearing mucosa beneath. The PEG was                            removed because it was leaking, and replaced with                            an endoscopically removable PEG and the bumper was                            placed at 2.5 cm..                           - No other gross lesions in the stomach.                           - No gross lesions in the duodenal bulb, in the                            first portion of the duodenum and in the  second                            portion of the duodenum. Recommendation:           - The patient will be observed post-procedure,                            until all discharge criteria are met.                           - Discharge patient to home.                           - Patient has a contact number available for                            emergencies. The signs and symptoms of potential                            delayed complications were discussed with the                            patient. Return to normal activities tomorrow.                            Written discharge instructions were provided to the                            patient.                           - Dilation diet as per protocol. Clear liquids for                            2 hours and then full liquids for rest of day. If  doing well, then may proceed to prior diet and chew                            thoroughly.                           - Restart Omeprazole 40 mg twice dail (take 30                            minutes before breakfast/dinner).                           - Continue present medications otherwise.                           - Repeat upper endoscopy in 1 month for retreatment                            to try and get patient up to 16 mm at some point in                            future (though patient is now eating much better).                           - Would not recommend removal of PEG tube until it                            is noted that his weight is able to be maintained                            with oral intake.                           - Please follow up with ENT for further evaluation                            of upper airway to ensure no other issues are                            present (I am unable to get great views with our                            endoscopes).                           - The findings and recommendations were discussed                             with the patient.                           - The findings and recommendations were discussed  with the patient's family. Procedure Code(s):        --- Professional ---                           [id number], Esophagogastroduodenoscopy, flexible,                            transoral; with directed placement of percutaneous                            gastrostomy tube                           [id number], Esophagogastroduodenoscopy, flexible,                            transoral; with insertion of guide wire followed by                            passage of dilator(s) through esophagus over guide                            wire Diagnosis Code(s):        --- Professional ---                           K22.2, Esophageal obstruction                           K94.23, Gastrostomy malfunction                           R13.10, Dysphagia, unspecified CPT copyright 2019 American Medical Association. All rights reserved. The codes documented in this report are preliminary and upon coder review may  be revised to meet current compliance requirements. Justice Britain, MD 01/26/2020 11:50:46 AM Number of Addenda: 0

## 2020-01-26 NOTE — H&P (Signed)
GASTROENTEROLOGY PROCEDURE H&P NOTE   Primary Care Physician: [name], MD  HPI: [name] is a 60 y.o. male who presents for EGD with dilation of upper esophageal stricture and possible PEG replacement.  Past Medical History:  Diagnosis Date  . Anemia   . Anxiety   . Arthritis    back  . Bradycardia    a. During 11/2012 adm: lopressor decreased.  . Cancer of larynx ([place])   . Carotid artery stenosis   . 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 Coronary artery disease    a. Diag cath 10/2012 for CP/abnormal nuc -> planned PCI s/p LAD atherectomy/DES placement 11/05/12.  . Depression   . Dilated aortic root ([place])   . GERD (gastroesophageal reflux disease)   . History of hiatal hernia   . Hypercholesteremia   . Hypertension   . PEG tube malfunction ([place])   . Pneumonia   . Stroke ([place]) 07/31/2018   bilateral vertebrobasilar occlusion; "left side weaker thanit was before."   Past Surgical History:  Procedure Laterality Date  . BIOPSY  11/24/2019   Procedure: BIOPSY;  Surgeon: Rush Landmark Telford Nab., MD;  Location: [place];  Service: Gastroenterology;;  . CARDIAC CATHETERIZATION  10/29/2012  . COLONOSCOPY WITH PROPOFOL N/A 11/24/2019   Procedure: COLONOSCOPY WITH PROPOFOL;  Surgeon: Rush Landmark Telford Nab., MD;  Location: [place];  Service: Gastroenterology;  Laterality: N/A;  . CORONARY ANGIOPLASTY WITH STENT PLACEMENT  11/05/2012  . DIRECT LARYNGOSCOPY N/A 01/02/2019   Procedure: MICRO DIRECT LARYNGOSCOPY BIOPSY OF LARYNGEAL MASS;  Surgeon: [name], MD;  Location: [facility];  Service: ENT;  Laterality: N/A;  . ESOPHAGOGASTRODUODENOSCOPY (EGD) WITH PROPOFOL N/A 08/11/2019   Procedure: ATTEMPTED ESOPHAGOGASTRODUODENOSCOPY (EGD) WITH PROPOFOL (UNABLE TO PERFORM PERCUTANEOUS ENDOSCOPIC GASTROSTOMY TUBE REPLACEMENT);  Surgeon: [name], MD;  Location: [facility];  Service:  Gastroenterology;  Laterality: N/A;  . ESOPHAGOGASTRODUODENOSCOPY (EGD) WITH PROPOFOL N/A 11/24/2019   Procedure: ESOPHAGOGASTRODUODENOSCOPY (EGD) WITH PROPOFOL;  Surgeon: Rush Landmark Telford Nab., MD;  Location: [place];  Service: Gastroenterology;  Laterality: N/A;  . HEMORRHOID SURGERY  ~ 2010  . INSERTION OF MESH N/A 06/02/2015   Procedure: INSERTION OF MESH;  Surgeon: [name], MD;  Location: [facility];  Service: General;  Laterality: N/A;  . IR ANGIO INTRA EXTRACRAN SEL COM CAROTID INNOMINATE BILAT MOD SED  08/02/2018  . IR ANGIO VERTEBRAL SEL VERTEBRAL BILAT MOD SED  08/02/2018  . IR REPLACE G-TUBE SIMPLE WO FLUORO  08/12/2019  . IR REPLC GASTRO/COLONIC TUBE PERCUT W/FLUORO  08/13/2019  . LAPAROSCOPIC INSERTION GASTROSTOMY TUBE N/A 02/24/2019   Procedure: LAPAROSCOPIC INSERTION GASTROSTOMY TUBE;  Surgeon: [name], MD;  Location: [place];  Service: General;  Laterality: N/A;  . LEFT HEART CATHETERIZATION WITH CORONARY ANGIOGRAM N/A 10/30/2012   Procedure: LEFT HEART CATHETERIZATION WITH CORONARY ANGIOGRAM;  Surgeon: [name], MD;  Location: [facility];  Service: Cardiovascular;  Laterality: N/A;  . MULTIPLE EXTRACTIONS WITH ALVEOLOPLASTY N/A 02/18/2019   Procedure: Extraction of tooth #'s 5-7, 10,11,14,20-29, 31 and 32 with alveoloplasty and maxiillary left buccal exostoses reductions;  Surgeon: [name], [name];  Location: [place];  Service: Oral Surgery;  Laterality: N/A;  . PERCUTANEOUS CORONARY ROTOBLATOR INTERVENTION (PCI-R) N/A 11/05/2012   Procedure: PERCUTANEOUS CORONARY ROTOBLATOR INTERVENTION (PCI-R);  Surgeon: [name], MD;  Location: [facility];  Service: Cardiovascular;  Laterality: N/A;  . SAVORY DILATION  N/A 11/24/2019   Procedure: SAVORY DILATION;  Surgeon: Rush Landmark Telford Nab., MD;  Location: [place];  Service: Gastroenterology;  Laterality: N/A;  . TRACHEOSTOMY TUBE PLACEMENT N/A 02/18/2019   Procedure: Tracheostomy;  Surgeon: [name], MD;   Location: [facility];  Service: ENT;  Laterality: N/A;  . UMBILICAL HERNIA REPAIR N/A 06/02/2015   Procedure: UMBILICAL HERNIORRHAPHY WITH MESH;  Surgeon: [name], MD;  Location: [facility];  Service: General;  Laterality: N/A;   Current Facility-Administered Medications  Medication Dose Route Frequency Provider Last Rate Last Admin  . lactated ringers infusion   Intravenous Continuous Mansouraty, Telford Nab., MD 20 mL/hr at 01/26/20 1017 New Bag at 01/26/20 1017   Allergies  Allergen Reactions  . Duloxetine Other (See Comments)    Caused depression  . Prednisone Other (See Comments)    Chest pain   Family History  Problem Relation Age of Onset  . Hypertension Mother   . Heart disease Father   . Hypertension Father   . Heart disease Sister   . Hypertension Maternal Grandmother   . Hypertension Maternal Grandfather   . Hypertension Paternal Grandmother   . Hypertension Paternal Grandfather   . Pancreatic cancer Maternal Uncle   . Breast cancer Maternal Aunt   . Breast cancer Maternal Aunt   . Diabetes Paternal Uncle   . Diabetes Paternal Aunt    Social History   Socioeconomic History  . Marital status: Married    Spouse name: [name]  . Number of children: 0  . Years of education: Not on file  . Highest education level: Not on file  Occupational History  . Occupation: Disabled    Comment: previously worked in Nurse, learning disability as an Investment banker, corporate  . Smoking status: Former Smoker    Packs/day: 3.00    Years: 32.00    Pack years: 96.00    Types: Cigarettes    Start date: 08/29/1977    Quit date: 11/29/2006    Years since quitting: 13.1  . Smokeless tobacco: Former Systems developer    Types: Chew    Quit date: 2003  . Tobacco comment: 11/05/2012 "chewed tobacco when I play ball; haven't chewed since age 61"  Vaping Use  . Vaping Use: Never used  Substance and Sexual Activity  . Alcohol use: Yes    Alcohol/week: 12.0 standard drinks    Types: 12 Cans of beer per  week    Comment: per wife [name] 6-7/week now as of 02/24/19  . Drug use: Not Currently    Types: Marijuana    Comment: Last use was on - denies on 04/08/19  . Sexual activity: Not Currently  Other Topics Concern  . Not on file  Social History Narrative   Patient is disabled.  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  Patient previously 3 pack/day use.   Patient currently drinking 3-4 beers per day from 12 pack a day.   Patient denies use of chew or other illicit drugs.   Social Determinants of Health   Financial Resource Strain: Not on file  Food Insecurity: No Food Insecurity  . Worried About Charity fundraiser in the Last Year: Never true  . Ran Out of Food in the Last Year: Never true  Transportation Needs: Not on file  Physical Activity: Not on file  Stress: Not on file  Social Connections: Not on file  Intimate Partner Violence: Not on file    Physical Exam: Vital signs in last 24 hours: Temp:  [97 F (36.1 C)] 97 F (36.1 C) (01/24 1013) Pulse Rate:  [58] 58 (01/24 1013) Resp:  [14] 14 (01/24 1013) BP: (175)/(97) 175/97 (01/24 1013) SpO2:  [95 %] 95 % (01/24 1013)   GEN: NAD EYE: Sclerae anicteric ENT: MMM CV: Non-tachycardic GI: Soft, NT/ND NEURO:  Alert & Oriented x 3  Lab Results: No results for input(s): WBC, HGB, HCT, PLT in the last 72 hours. BMET No results for input(s): NA, K, CL, CO2, GLUCOSE, BUN, CREATININE, CALCIUM in the last 72 hours. LFT No results for input(s): PROT, ALBUMIN, AST, ALT, ALKPHOS, BILITOT, BILIDIR, IBILI in the last 72 hours. PT/INR No results for input(s): LABPROT, INR in the last 72 hours.   Impression / Plan: This is a 60 y.o.male who presents for EGD with dilation of upper esophageal stricture and possible PEG replacement.  The risks and benefits of endoscopic evaluation were discussed with the patient; these include but are  not limited to the risk of perforation, infection, bleeding, missed lesions, lack of diagnosis, severe illness requiring hospitalization, as well as anesthesia and sedation related illnesses.  The patient is agreeable to proceed.    Justice Britain, MD [facility] Advanced [facility]oscopy Office # [phone]

## 2020-01-26 NOTE — Anesthesia Preprocedure Evaluation (Addendum)
Anesthesia Evaluation  Patient identified by MRN, date of birth, ID band Patient awake    Reviewed: Allergy & Precautions, NPO status , Patient's Chart, lab work & pertinent test results, reviewed documented beta blocker date and time   Airway Mallampati: II  TM Distance: >3 FB Neck ROM: Full    Dental  (+) Edentulous Upper, Edentulous Lower, Dental Advisory Given   Pulmonary COPD, former smoker,  Quit smoking 2008, 96 pack year history  Laryngeal CA  Asymmetric soft tissue mass lesion within the left aryepiglottic fold extending to the supraglottic larynx measures at least 1.7 x 1.2 x 0.7 cm.   S/p tracheostomy on 02/18/19 due to difficult airway now decannulated   Pulmonary exam normal breath sounds clear to auscultation       Cardiovascular hypertension, Pt. on medications and Pt. on home beta blockers + CAD, + Cardiac Stents and +CHF  Normal cardiovascular exam Rhythm:Regular Rate:Normal  TTE 2021 1. Left ventricular ejection fraction, by estimation, is 60 to 65%. The  left ventricle has normal function. The left ventricle has no regional  wall motion abnormalities. There is mild left ventricular hypertrophy.  Left ventricular diastolic parameters  are consistent with Grade I diastolic dysfunction (impaired relaxation).  2. Right ventricular systolic function is normal. The right ventricular  size is normal.  3. The mitral valve is normal in structure. No evidence of mitral valve  regurgitation. No evidence of mitral stenosis.  4. The aortic valve is tricuspid. Aortic valve regurgitation is mild. No  aortic stenosis is present.  5. Aortic dilatation noted. There is moderate dilatation of the aortic  root, measuring 44 mm.  6. The inferior vena cava is normal in size with greater than 50%  respiratory variability, suggesting right atrial pressure of 3 mmHg.   Neuro/Psych PSYCHIATRIC DISORDERS Anxiety Depression  07/31/2018 bilateral vertebrobasilar occlusion  Carotid US 02/12/19: - Right Carotid: Velocities in the right ICA are consistent with a 40-59% stenosis.  - Left Carotid: Velocities in the left ICA are consistent with a 1-39% stenosis.   Residual left sided weakness CVA, Residual Symptoms    GI/Hepatic hiatal hernia, GERD  Medicated,(+)     substance abuse  alcohol use and marijuana use,   Endo/Other  negative endocrine ROS  Renal/GU negative Renal ROS  negative genitourinary   Musculoskeletal  (+) Arthritis , Osteoarthritis,    Abdominal   Peds  Hematology negative hematology ROS (+)   Anesthesia Other Findings   Reproductive/Obstetrics                            Anesthesia Physical  Anesthesia Plan  ASA: III  Anesthesia Plan: MAC   Post-op Pain Management:    Induction: Intravenous  PONV Risk Score and Plan: 1 and Treatment may vary due to age or medical condition and Propofol infusion  Airway Management Planned: Natural Airway  Additional Equipment: None  Intra-op Plan:   Post-operative Plan:   Informed Consent: I have reviewed the patients History and Physical, chart, labs and discussed the procedure including the risks, benefits and alternatives for the proposed anesthesia with the patient or authorized representative who has indicated his/her understanding and acceptance.     Dental advisory given  Plan Discussed with:   Anesthesia Plan Comments:        Anesthesia Quick Evaluation  Anesthesia Evaluation  Patient identified by MRN, date of birth, ID band Patient awake    Reviewed: Allergy & Precautions, NPO status , Patient's Chart, lab work & pertinent test results  Airway Mallampati: I  TM Distance: >3 FB Neck ROM: Full    Dental no notable dental hx. (+) Poor Dentition,    Pulmonary former smoker,    Pulmonary exam normal breath sounds clear to  auscultation       Cardiovascular hypertension, Pt. on medications + CAD and + Cardiac Stents  Normal cardiovascular exam Rhythm:Regular Rate:Normal     Neuro/Psych Anxiety L sided weakeness CVA    GI/Hepatic Neg liver ROS, GERD  Medicated,  Endo/Other  negative endocrine ROS  Renal/GU Cr 1.05     Musculoskeletal   Abdominal   Peds  Hematology negative hematology ROS (+)   Anesthesia Other Findings   Reproductive/Obstetrics                           Anesthesia Physical Anesthesia Plan  ASA: III  Anesthesia Plan: General   Post-op Pain Management:    Induction: Intravenous  PONV Risk Score and Plan: Treatment may vary due to age or medical condition and Ondansetron  Airway Management Planned: Oral ETT  Additional Equipment:   Intra-op Plan:   Post-operative Plan: Extubation in OR  Informed Consent: I have reviewed the patients History and Physical, chart, labs and discussed the procedure including the risks, benefits and alternatives for the proposed anesthesia with the patient or authorized representative who has indicated his/her understanding and acceptance.     Dental advisory given  Plan Discussed with: CRNA  Anesthesia Plan Comments: (PAT note written 01/01/2019 by Myra Gianotti, PA-C. SAME DAY WORK-UP   )       Anesthesia Quick Evaluation

## 2020-01-26 NOTE — Transfer of Care (Signed)
Immediate Anesthesia Transfer of Care Note  Patient: [name]  Procedure(s) Performed: ESOPHAGOGASTRODUODENOSCOPY (EGD) WITH PROPOFOL (N/A ) SAVORY DILATION (N/A ) PERCUTANEOUS ENDOSCOPIC GASTROSTOMY (PEG) REPLACEMENT (N/A )  Patient Location: PACU  Anesthesia Type:MAC  Level of Consciousness: awake, drowsy and patient cooperative  Airway & Oxygen Therapy: Patient Spontanous Breathing and Patient connected to nasal cannula oxygen  Post-op Assessment: Report given to RN, Post -op Vital signs reviewed and stable and Patient moving all extremities X 4  Post vital signs: Reviewed and stable  Last Vitals:  Vitals Value Taken Time  BP    Temp    Pulse    Resp 14 01/26/20 1140  SpO2    Vitals shown include unvalidated device data.  Last Pain:  Vitals:   01/26/20 1013  TempSrc: Temporal  PainSc: 0-No pain         Complications: No complications documented.

## 2020-01-27 ENCOUNTER — Telehealth: Payer: Self-pay | Admitting: Gastroenterology

## 2020-01-27 NOTE — Anesthesia Postprocedure Evaluation (Signed)
Anesthesia Post Note  Patient: PASCHAL BLANTON  Procedure(s) Performed: ESOPHAGOGASTRODUODENOSCOPY (EGD) WITH PROPOFOL (N/A ) SAVORY DILATION (N/A ) PERCUTANEOUS ENDOSCOPIC GASTROSTOMY (PEG) REPLACEMENT (N/A )     Patient location during evaluation: Endoscopy Anesthesia Type: MAC Level of consciousness: awake and alert Pain management: pain level controlled Vital Signs Assessment: post-procedure vital signs reviewed and stable Respiratory status: spontaneous breathing, nonlabored ventilation, respiratory function stable and patient connected to nasal cannula oxygen Cardiovascular status: stable and blood pressure returned to baseline Postop Assessment: no apparent nausea or vomiting Anesthetic complications: no   No complications documented.  Last Vitals:  Vitals:   01/26/20 1205 01/26/20 1210  BP: (!) 149/80 (!) 149/83  Pulse: (!) 50 (!) 49  Resp: 11 12  Temp: (!) 36.1 C   SpO2: 98% 98%    Last Pain:  Vitals:   01/26/20 1205  TempSrc:   PainSc: 0-No pain                 Catalina Gravel

## 2020-01-27 NOTE — Telephone Encounter (Signed)
Pt called stating that he has a procedure with Dr. Rush Landmark yesterday where he placed a G-tube. Pt states that tube came out today but he was able to put it back in. He wants to know if this is ok or what he heeds to do. Pls call him.

## 2020-01-27 NOTE — Telephone Encounter (Signed)
I spoke with the pt and he states he will do as recommended and will call IR if he continues to have issues.

## 2020-01-27 NOTE — Telephone Encounter (Signed)
Dr Rush Landmark the pt states that his G tube came out and he put it back in with no problem.  He says it is working fine.  He does not remember who originally put the G tube in.  I advised him to to call if he has any future issues.  Any further recommendations?

## 2020-01-27 NOTE — Telephone Encounter (Signed)
[name], if he placed it back in and it is working fine that this good news. I did replace it yesterday since it was leaking. He needs to get a 10 mL syringe and place 6-8 mL of water or air into the balloon port so that it will stay in place. If he has further issues he may need this to be replaced again by Interventional radiology. Thank you. GM

## 2020-01-29 ENCOUNTER — Encounter (HOSPITAL_COMMUNITY): Payer: Self-pay

## 2020-01-29 ENCOUNTER — Ambulatory Visit (HOSPITAL_COMMUNITY): Admit: 2020-01-29 | Payer: 59 | Admitting: Gastroenterology

## 2020-01-29 ENCOUNTER — Telehealth: Payer: Self-pay

## 2020-01-29 ENCOUNTER — Other Ambulatory Visit: Payer: Self-pay

## 2020-01-29 ENCOUNTER — Encounter (HOSPITAL_COMMUNITY): Payer: Self-pay | Admitting: Gastroenterology

## 2020-01-29 DIAGNOSIS — R1310 Dysphagia, unspecified: Secondary | ICD-10-CM

## 2020-01-29 SURGERY — EGD (ESOPHAGOGASTRODUODENOSCOPY)
Anesthesia: Monitor Anesthesia Care

## 2020-01-29 NOTE — Telephone Encounter (Signed)
EGD scheduled for 02/23/20 at 730 am at [facility] with Dr Rush Landmark.  COVID test at 1035 am on 2/17.    EGD scheduled, pt instructed and medications reviewed.  Patient instructions mailed to home.  Patient to call with any questions or concerns.

## 2020-01-29 NOTE — Telephone Encounter (Signed)
-----   Message from Irving Copas., MD sent at 01/29/2020  4:48 AM EST ----- Regarding: Follow up [name], Patient needs a 4-6 week EGD with dilation Fluroscopy bed available. 45 minute case. Please work on scheduling. Thanks. GM

## 2020-02-03 ENCOUNTER — Telehealth: Payer: Self-pay | Admitting: Gastroenterology

## 2020-02-03 ENCOUNTER — Telehealth: Payer: Self-pay

## 2020-02-03 NOTE — Telephone Encounter (Signed)
The pt has been advised to call IR to discuss issues with feeding tube.  I provided the number to IR.  The pt will call our office with any further GI concerns.

## 2020-02-03 NOTE — Telephone Encounter (Signed)
Patients wife called to advise that a feeding tube was shorten and placed. She said the tube came out and its not bleeding or anything looking for assistants

## 2020-02-03 NOTE — Telephone Encounter (Signed)
Contacted patient to verify telephone visit for pre reg °

## 2020-02-04 ENCOUNTER — Inpatient Hospital Stay: Payer: 59 | Attending: Radiation Oncology

## 2020-02-04 NOTE — Progress Notes (Signed)
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  (05/08/19). Noted EGD with dilation on 1/24 with new G-tube placment.    Spoke with patient via phone.  Patient reports that feeding tube came out about 2 days ago and MD told him to keep it out and eat what he is able to eat.  Tube has not been replaced.  Patient reports that he has been eating for about 4 weeks or more since he got his teeth.  Reports that yesterday he ate a foot long sub sandwich mid-day and ham and potato salad for supper.  Today had ham sandwich.  Reports that he is not drinking oral nutrition supplements.     Medications: reviewed  Labs: no new  Anthropometrics:   Weight per patient on home scale 148 lb   NUTRITION DIAGNOSIS: Predicted sub optimal energy intake improved   INTERVENTION:  Encouraged patient to continue high calorie high protein foods orally to continue weight gain.  RD available as needed.  Patient and wife have contact information if needed   NEXT VISIT: no follow-up  Catilyn Boggus B. [name], [place], [place] Registered Dietitian [phone] (mobile)

## 2020-02-05 ENCOUNTER — Encounter (HOSPITAL_COMMUNITY): Payer: Self-pay | Admitting: *Deleted

## 2020-02-12 ENCOUNTER — Other Ambulatory Visit: Payer: Self-pay

## 2020-02-12 ENCOUNTER — Ambulatory Visit (HOSPITAL_COMMUNITY)
Admission: RE | Admit: 2020-02-12 | Discharge: 2020-02-12 | Disposition: A | Discharge status: Home / Self Care | Payer: 59 | Source: Ambulatory Visit | Attending: Hematology | Admitting: Hematology

## 2020-02-12 ENCOUNTER — Inpatient Hospital Stay (HOSPITAL_COMMUNITY): Payer: 59 | Attending: Hematology

## 2020-02-12 DIAGNOSIS — C321 Malignant neoplasm of supraglottis: Secondary | ICD-10-CM | POA: Insufficient documentation

## 2020-02-12 DIAGNOSIS — R197 Diarrhea, unspecified: Secondary | ICD-10-CM | POA: Insufficient documentation

## 2020-02-12 DIAGNOSIS — Z87891 Personal history of nicotine dependence: Secondary | ICD-10-CM | POA: Insufficient documentation

## 2020-02-12 DIAGNOSIS — R7989 Other specified abnormal findings of blood chemistry: Secondary | ICD-10-CM | POA: Insufficient documentation

## 2020-02-12 LAB — CBC WITH DIFFERENTIAL/PLATELET
Abs Immature Granulocytes: 0.02 10*3/uL (ref 0.00–0.07)
Basophils Absolute: 0 10*3/uL (ref 0.0–0.1)
Basophils Relative: 1 %
Eosinophils Absolute: 0.5 10*3/uL (ref 0.0–0.5)
Eosinophils Relative: 9 %
HCT: 40.4 % (ref 39.0–52.0)
Hemoglobin: 13.1 g/dL (ref 13.0–17.0)
Immature Granulocytes: 0 %
Lymphocytes Relative: 22 %
Lymphs Abs: 1.2 10*3/uL (ref 0.7–4.0)
MCH: 32.1 pg (ref 26.0–34.0)
MCHC: 32.4 g/dL (ref 30.0–36.0)
MCV: 99 fL (ref 80.0–100.0)
Monocytes Absolute: 0.5 10*3/uL (ref 0.1–1.0)
Monocytes Relative: 8 %
Neutro Abs: 3.4 10*3/uL (ref 1.7–7.7)
Neutrophils Relative %: 60 %
Platelets: 278 10*3/uL (ref 150–400)
RBC: 4.08 MIL/uL — ABNORMAL LOW (ref 4.22–5.81)
RDW: 15.9 % — ABNORMAL HIGH (ref 11.5–15.5)
WBC: 5.6 10*3/uL (ref 4.0–10.5)
nRBC: 0 % (ref 0.0–0.2)

## 2020-02-12 LAB — COMPREHENSIVE METABOLIC PANEL
ALT: 83 U/L — ABNORMAL HIGH (ref 0–44)
AST: 171 U/L — ABNORMAL HIGH (ref 15–41)
Albumin: 3.2 g/dL — ABNORMAL LOW (ref 3.5–5.0)
Alkaline Phosphatase: 335 U/L — ABNORMAL HIGH (ref 38–126)
Anion gap: 6 (ref 5–15)
BUN: 9 mg/dL (ref 6–20)
CO2: 29 mmol/L (ref 22–32)
Calcium: 8.5 mg/dL — ABNORMAL LOW (ref 8.9–10.3)
Chloride: 93 mmol/L — ABNORMAL LOW (ref 98–111)
Creatinine, Ser: 0.99 mg/dL (ref 0.61–1.24)
GFR, Estimated: >60 mL/min (ref >60)
Glucose, Bld: 93 mg/dL (ref 70–99)
Potassium: 3.5 mmol/L (ref 3.5–5.1)
Sodium: 128 mmol/L — ABNORMAL LOW (ref 135–145)
Total Bilirubin: 0.7 mg/dL (ref 0.3–1.2)
Total Protein: 6.6 g/dL (ref 6.5–8.1)

## 2020-02-12 LAB — MAGNESIUM: Magnesium: 1.6 mg/dL — ABNORMAL LOW (ref 1.7–2.4)

## 2020-02-12 MED ORDER — IOHEXOL 300 MG/ML  SOLN
75.0000 mL | Freq: Once | INTRAMUSCULAR | Status: AC | PRN
  Administered 2020-02-12: 75 mL via INTRAVENOUS

## 2020-02-16 NOTE — Progress Notes (Signed)
Attempted to obtain medical history via telephone, unable to reach at this time. I left a voicemail to return pre surgical testing department's phone call.  

## 2020-02-17 ENCOUNTER — Other Ambulatory Visit: Payer: Self-pay

## 2020-02-17 ENCOUNTER — Inpatient Hospital Stay (HOSPITAL_BASED_OUTPATIENT_CLINIC_OR_DEPARTMENT_OTHER): Payer: 59 | Admitting: Hematology

## 2020-02-17 VITALS — BP 100/74 | HR 73 | Temp 96.6°F | Resp 16 | Wt 131.2 lb

## 2020-02-17 DIAGNOSIS — C321 Malignant neoplasm of supraglottis: Secondary | ICD-10-CM | POA: Diagnosis not present

## 2020-02-17 DIAGNOSIS — Z87891 Personal history of nicotine dependence: Secondary | ICD-10-CM | POA: Diagnosis not present

## 2020-02-17 DIAGNOSIS — R7989 Other specified abnormal findings of blood chemistry: Secondary | ICD-10-CM | POA: Diagnosis not present

## 2020-02-17 DIAGNOSIS — R197 Diarrhea, unspecified: Secondary | ICD-10-CM | POA: Diagnosis not present

## 2020-02-17 MED ORDER — DRONABINOL 5 MG PO CAPS: 5.0000 mg | ORAL_CAPSULE | Freq: Two times a day (BID) | ORAL | 2 refills | Status: DC

## 2020-02-17 MED ORDER — DIPHENOXYLATE-ATROPINE 2.5-0.025 MG PO TABS: 2.0000 | ORAL_TABLET | Freq: Three times a day (TID) | ORAL | 0 refills | Status: DC

## 2020-02-17 NOTE — Patient Instructions (Signed)
[place] at [facility] Discharge Instructions  You were seen today by Dr. Delton Coombes. He went over your recent results and scans. You will be prescribed Marinol 5 mg to take twice daily to increase your appetite. You will also be prescribed Lomotil to take 2 tablets three times daily to prevent the watery diarrhea; stop taking it once your stools have formed. Keep a food journal and avoid the foods that cause you to have diarrhea. Keep your appointment with Dr. Rush Landmark and mention the diarrhea to him. You will be scheduled to have a CT scan of your abdomen before your next visit. Dr. Delton Coombes will see you back after the scan for follow up.   Thank you for choosing [place] at [facility] to provide your oncology and hematology care.  To afford each patient quality time with our provider, please arrive at least 15 minutes before your scheduled appointment time.   If you have a lab appointment with the [facility] please come in thru the Main Entrance and check in at the main information desk  You need to re-schedule your appointment should you arrive 10 or more minutes late.  We strive to give you quality time with our providers, and arriving late affects you and other patients whose appointments are after yours.  Also, if you no show three or more times for appointments you may be dismissed from the clinic at the providers discretion.     Again, thank you for choosing [facility].  Our hope is that these requests will decrease the amount of time that you wait before being seen by our physicians.       _____________________________________________________________  Should you have questions after your visit to [facility], please contact our office at [phone] between the hours of 8:00 a.m. and 4:30 p.m.  Voicemails left after 4:00 p.m. will not be returned until the following business day.  For prescription refill  requests, have your pharmacy contact our office and allow 72 hours.    Cancer Center Support Programs:   > Cancer Support Group  2nd Tuesday of the month 1pm-2pm, Journey Room

## 2020-02-17 NOTE — Progress Notes (Signed)
[name], [place] [address]   CLINIC:  Medical Oncology/Hematology  PCP:  [name], [place] / [place] [address] [phone]   REASON FOR VISIT:  Follow-up for supraglottic squamous cell carcinoma  PRIOR THERAPY: Chemoradiation with weekly cisplatin from 03/07/2019 to 05/15/2019  NGS Results: Not done  CURRENT THERAPY: Surveillance  BRIEF ONCOLOGIC HISTORY:  Oncology History  Malignant neoplasm of supraglottis ([name])  01/22/2019 Initial Diagnosis   Malignant neoplasm of supraglottis ([name])   01/30/2019 Cancer Staging   Staging form: Larynx - Supraglottis, AJCC 8th Edition - Clinical: Stage IVA (cT2, cN2c, cM0) - Signed by [name], MD on 01/30/2019   03/07/2019 -  Chemotherapy   The patient had palonosetron (ALOXI) injection 0.25 mg, 0.25 mg, Intravenous,  Once, 6 of 6 cycles Administration: 0.25 mg (03/13/2019), 0.25 mg (03/07/2019), 0.25 mg (03/27/2019), 0.25 mg (05/01/2019), 0.25 mg (05/08/2019), 0.25 mg (05/15/2019) CISplatin (PLATINOL) 79 mg in sodium chloride 0.9 % 250 mL chemo infusion, 40 mg/m2 = 79 mg, Intravenous,  Once, 6 of 6 cycles Administration: 79 mg (03/13/2019), 79 mg (03/07/2019), 79 mg (03/27/2019), 79 mg (05/01/2019), 79 mg (05/08/2019), 79 mg (05/15/2019) fosaprepitant (EMEND) 150 mg in sodium chloride 0.9 % 145 mL IVPB, 150 mg, Intravenous,  Once, 6 of 6 cycles Administration: 150 mg (03/13/2019), 150 mg (03/07/2019), 150 mg (03/27/2019), 150 mg (05/01/2019), 150 mg (05/08/2019), 150 mg (05/15/2019)  for chemotherapy treatment.      CANCER STAGING: Cancer Staging Malignant neoplasm of supraglottis [facility]) Staging form: Larynx - Supraglottis, AJCC 8th Edition - Clinical: Stage IVA (cT2, cN2c, cM0) - Signed by [name], MD on 01/30/2019   INTERVAL HISTORY:  [name], a 60 y.o. male, returns for routine follow-up of his supraglottic squamous cell carcinoma. Dontarious was last seen on  11/20/2019.   Today he is accompanied by his wife and he reports feeling fair. He reports having watery diarrhea, twice a day 30 minutes after eating, for the past 4 days and has stopped drinking Boost or Ensure since it was causing the diarrhea. He tried taking Imodium 2 tablets with every watery BM but it did not help. He is eating once daily. His PEG tube fell off around 01/31 since it was sticking out and he is eating by mouth since his teeth were put in. He notes feeling weak while having the diarrhea. He denies having any abdominal discharge or abdominal pain. He is not on any antibiotics recently.  He is scheduled to have an EGD on 02/21 to get his last esophageal dilation.   REVIEW OF SYSTEMS:  Review of Systems  Constitutional: Positive for appetite change (50%), fatigue (50%) and unexpected weight change (lost 20 lbs in 3 months).  Gastrointestinal: Positive for diarrhea (last 4 days). Negative for abdominal pain.  Neurological: Positive for dizziness.  All other systems reviewed and are negative.   PAST MEDICAL/SURGICAL HISTORY:  Past Medical History:  Diagnosis Date  . Anemia   . Anxiety   . Arthritis    back  . Bradycardia    a. During 11/2012 adm: lopressor decreased.  . Cancer of larynx ([place])   . Carotid artery stenosis   . 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 Coronary artery disease    a. Diag cath 10/2012 for CP/abnormal nuc -> planned PCI s/p LAD atherectomy/DES placement 11/05/12.  Marland Kitchen  Depression   . Dilated aortic root ([place])   . GERD (gastroesophageal reflux disease)   . History of hiatal hernia   . Hypercholesteremia   . Hypertension   . PEG tube malfunction ([place])   . Pneumonia   . Stroke ([place]) 07/31/2018   bilateral vertebrobasilar occlusion; "left side weaker thanit was before."   Past Surgical History:  Procedure Laterality Date  . BIOPSY  11/24/2019   Procedure: BIOPSY;   Surgeon: Rush Landmark Telford Nab., MD;  Location: [facility];  Service: Gastroenterology;;  . CARDIAC CATHETERIZATION  10/29/2012  . COLONOSCOPY WITH PROPOFOL N/A 11/24/2019   Procedure: COLONOSCOPY WITH PROPOFOL;  Surgeon: Rush Landmark Telford Nab., MD;  Location: [facility];  Service: Gastroenterology;  Laterality: N/A;  . CORONARY ANGIOPLASTY WITH STENT PLACEMENT  11/05/2012  . DIRECT LARYNGOSCOPY N/A 01/02/2019   Procedure: MICRO DIRECT LARYNGOSCOPY BIOPSY OF LARYNGEAL MASS;  Surgeon: [name], MD;  [facility];  Service: ENT;  Laterality: N/A;  . ESOPHAGOGASTRODUODENOSCOPY (EGD) WITH PROPOFOL N/A 08/11/2019   Procedure: ATTEMPTED ESOPHAGOGASTRODUODENOSCOPY (EGD) WITH PROPOFOL (UNABLE TO PERFORM PERCUTANEOUS ENDOSCOPIC GASTROSTOMY TUBE REPLACEMENT);  Surgeon: [name], MD;  Location: [facility];  Service: Gastroenterology;  Laterality: N/A;  . ESOPHAGOGASTRODUODENOSCOPY (EGD) WITH PROPOFOL N/A 11/24/2019   Procedure: ESOPHAGOGASTRODUODENOSCOPY (EGD) WITH PROPOFOL;  Surgeon: Rush Landmark Telford Nab., MD;  Location: [facility];  Service: Gastroenterology;  Laterality: N/A;  . ESOPHAGOGASTRODUODENOSCOPY (EGD) WITH PROPOFOL N/A 01/26/2020   Procedure: ESOPHAGOGASTRODUODENOSCOPY (EGD) WITH PROPOFOL;  Surgeon: Rush Landmark Telford Nab., MD;  Location: [facility];  Service: Gastroenterology;  Laterality: N/A;  . HEMORRHOID SURGERY  ~ 2010  . INSERTION OF MESH N/A 06/02/2015   Procedure: INSERTION OF MESH;  Surgeon: [name], MD;  [facility]: [facility];  Service: General;  Laterality: N/A;  . IR ANGIO INTRA EXTRACRAN SEL COM CAROTID INNOMINATE BILAT MOD SED  08/02/2018  . IR ANGIO VERTEBRAL SEL VERTEBRAL BILAT MOD SED  08/02/2018  . IR REPLACE G-TUBE SIMPLE WO FLUORO  08/12/2019  . IR REPLC GASTRO/COLONIC TUBE PERCUT W/FLUORO  08/13/2019  . LAPAROSCOPIC INSERTION GASTROSTOMY TUBE N/A 02/24/2019   Procedure: LAPAROSCOPIC INSERTION GASTROSTOMY TUBE;  Surgeon: [name], MD;  Location: [facility];  Service:  General;  Laterality: N/A;  . LEFT HEART CATHETERIZATION WITH CORONARY ANGIOGRAM N/A 10/30/2012   Procedure: LEFT HEART CATHETERIZATION WITH CORONARY ANGIOGRAM;  Surgeon: [name], MD;  Location: [facility];  Service: Cardiovascular;  Laterality: N/A;  . MULTIPLE EXTRACTIONS WITH ALVEOLOPLASTY N/A 02/18/2019   Procedure: Extraction of tooth #'s 5-7, 10,11,14,20-29, 31 and 32 with alveoloplasty and maxiillary left buccal exostoses reductions;  Surgeon: [name], [name];  Location: [facility];  Service: Oral Surgery;  Laterality: N/A;  . PEG PLACEMENT N/A 01/26/2020   Procedure: PERCUTANEOUS ENDOSCOPIC GASTROSTOMY (PEG) REPLACEMENT;  Surgeon: Irving Copas., MD;  Location: [facility];  Service: Gastroenterology;  Laterality: N/A;  . PERCUTANEOUS CORONARY ROTOBLATOR INTERVENTION (PCI-R) N/A 11/05/2012   Procedure: PERCUTANEOUS CORONARY ROTOBLATOR INTERVENTION (PCI-R);  Surgeon: [name], MD;  Location: [facility];  Service: Cardiovascular;  Laterality: N/A;  . SAVORY DILATION N/A 11/24/2019   Procedure: SAVORY DILATION;  Surgeon: Irving Copas., MD;  Location: [facility];  Service: Gastroenterology;  Laterality: N/A;  . SAVORY DILATION N/A 01/26/2020   Procedure: SAVORY DILATION;  Surgeon: Irving Copas., MD;  Location: [facility];  Service: Gastroenterology;  Laterality: N/A;  . TRACHEOSTOMY TUBE PLACEMENT N/A 02/18/2019   Procedure: Tracheostomy;  Surgeon: [name], MD;  Location: [facility];  Service:  ENT;  Laterality: N/A;  . UMBILICAL HERNIA REPAIR N/A 06/02/2015   Procedure: UMBILICAL HERNIORRHAPHY WITH MESH;  Surgeon: [name], MD;  [facility];  Service: General;  Laterality: N/A;    SOCIAL HISTORY:  Social History   Socioeconomic History  . Marital status: Married    Spouse name: [name]  . Number of children: 0  . Years of education: Not on file  . Highest education level: Not on file  Occupational History  . Occupation: Disabled     Comment: previously worked in Nurse, learning disability as an Investment banker, corporate  . Smoking status: Former Smoker    Packs/day: 3.00    Years: 32.00    Pack years: 96.00    Types: Cigarettes    Start date: 08/29/1977    Quit date: 11/29/2006    Years since quitting: 13.2  . Smokeless tobacco: Former Systems developer    Types: Chew    Quit date: 2003  . Tobacco comment: 11/05/2012 "chewed tobacco when I play ball; haven't chewed since age 52"  Vaping Use  . Vaping Use: Never used  Substance and Sexual Activity  . Alcohol use: Yes    Alcohol/week: 12.0 standard drinks    Types: 12 Cans of beer per week    Comment: per wife [name] 6-7/week now as of 02/24/19  . Drug use: Not Currently    Types: Marijuana    Comment: Last use was on - denies on 04/08/19  . Sexual activity: Not Currently  Other Topics Concern  . Not on file  Social History Narrative   Patient is disabled.  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  Patient previously 3 pack/day use.   Patient currently drinking 3-4 beers per day from 12 pack a day.   Patient denies use of chew or other illicit drugs.   Social Determinants of Health   Financial Resource Strain: Not on file  Food Insecurity: No Food Insecurity  . Worried About Charity fundraiser in the Last Year: Never true  . Ran Out of Food in the Last Year: Never true  Transportation Needs: Not on file  Physical Activity: Not on file  Stress: Not on file  Social Connections: Not on file  Intimate Partner Violence: Not on file    FAMILY HISTORY:  Family History  Problem Relation Age of Onset  . Hypertension Mother   . Heart disease Father   . Hypertension Father   . Heart disease Sister   . Hypertension Maternal Grandmother   . Hypertension Maternal Grandfather   . Hypertension Paternal Grandmother   . Hypertension Paternal Grandfather   . Pancreatic cancer  Maternal Uncle   . Breast cancer Maternal Aunt   . Breast cancer Maternal Aunt   . Diabetes Paternal Uncle   . Diabetes Paternal Aunt     CURRENT MEDICATIONS:  Current Outpatient Medications  Medication Sig Dispense Refill  . amLODipine (NORVASC) 5 MG tablet Take 5 mg by mouth daily.    Marland Kitchen aspirin EC 81 MG tablet Take 81 mg by mouth daily.     Marland Kitchen atorvastatin (LIPITOR) 40 MG tablet Take 40 mg by mouth at bedtime.    . diazepam (VALIUM) 10 MG tablet Place 10 mg into feeding tube 3 (three) times daily as needed for anxiety or sleep.     . DULoxetine (CYMBALTA) 60 MG capsule Take 60 mg  by mouth daily.    . famotidine (PEPCID) 20 MG tablet Take 20 mg by mouth at bedtime.    . metoprolol tartrate (LOPRESSOR) 25 MG tablet Take 12.5 mg by mouth 2 (two) times daily.    . nitroGLYCERIN (NITROSTAT) 0.4 MG SL tablet Place 1 tablet (0.4 mg total) under the tongue every 5 (five) minutes as needed for chest pain (CP or SOB). 25 tablet 3  . Nutritional Supplements (FEEDING SUPPLEMENT, OSMOLITE 1.5 CAL,) LIQD Give 2 cartons of osmolite 1.5 at 9:30am, 1:30, 5:30 and 1 carton at 9:30pm.  Flush with 31m of water before and 1226mafter each feeding. Meets 100% of patient needs. Send bolus supplies. (Patient taking differently: Give 2 cartons of osmolite 1.5 at 9:30am, 1:30, 5:30 and 1 carton at 9:30pm.  Flush with 6040mf water before and 120m35mter each feeding. Meets 100% of patient needs. Send bolus supplies.) 1659 mL 0  . Nutritional Supplements (PROSOURCE TF) LIQD Give 30ml35mimes per day via feeding tube.  Mix with 60ml 11mater.  Flush tube with 60ml o50mter, then place prosource and water mixture in tube and flush with 60ml of46mer after. (Patient taking differently: Give 30ml 2 t34m per day via feeding tube.  Mix with 60ml of w45m.  Flush tube with 60ml of wa9m then place prosource and water mixture in tube and flush with 60ml of wat68mfter.) 60 mL 3  . omeprazole (PRILOSEC) 40 MG capsule Take 1  capsule (40 mg total) by mouth 2 (two) times daily before a meal. (Patient taking differently: Take 40 mg by mouth daily.) 60 capsule 5  . traMADol (ULTRAM) 50 MG tablet Take 50-100 mg by mouth 4 (four) times daily as needed for moderate pain (pain.).    . Water For Marland Kitchenrrigation, Sterile (FREE WATER) SOLN Place 100 mLs into feeding tube See admin instructions. Please Flush PEG tube with 1 ounce (30 ml) before giving to feed, and then flush with 3 ounces (90ml) after 3mng to feed--- up to 4 times a day 1000 mL 23  . acetaminophen (TYLENOL) 325 MG tablet Take 2 tablets (650 mg total) by mouth every 6 (six) hours as needed for mild pain, fever or headache (or Fever >/= 101). (Patient not taking: Reported on 02/17/2020) 12 tablet 0  . HYDROcodone-acetaminophen (NORCO) 10-325 MG tablet Take 1 tablet by mouth every 4 (four) hours as needed. (Patient not taking: Reported on 02/17/2020) 50 tablet 0  . lidocaine (XYLOCAINE) 2 % solution Patient: Mix 1part 2% viscous lidocaine, 1part H20. Swish & swallow 10mL of dilut32mixture, 30min before m25m and at bedtime, up to QID (Patient not taking: Reported on 02/17/2020) 200 mL 4  . loperamide (IMODIUM) 2 MG capsule Take 2 mg by mouth daily as needed for diarrhea or loose stools. (Patient not taking: Reported on 02/17/2020)    . prochlorperazine (COMPAZINE) 10 MG tablet TAKE 1 TABLET(10 MG) BY MOUTH EVERY 6 HOURS AS NEEDED FOR NAUSEA (Patient not taking: Reported on 02/17/2020) 60 tablet 2   No current facility-administered medications for this visit.    ALLERGIES:  Allergies  Allergen Reactions  . Duloxetine Other (See Comments)    Caused depression/ patient is taking  . Prednisone Other (See Comments)    Chest pain    PHYSICAL EXAM:  Performance status (ECOG): 1 - Symptomatic but completely ambulatory  Vitals:   02/17/20 1535 02/17/20 1543  BP: 100/74   Pulse: 73   Resp: 16   Temp: (!)  96.6 F (35.9 C)   SpO2: 90% 99%   Wt Readings from Last 3  Encounters:  02/17/20 131 lb 3.2 oz (59.5 kg)  11/24/19 153 lb (69.4 kg)  11/20/19 152 lb 6.4 oz (69.1 kg)   Physical Exam Vitals reviewed.  Constitutional:      Appearance: Normal appearance.  HENT:     Mouth/Throat:     Lips: No lesions.     Mouth: No oral lesions.  Cardiovascular:     Rate and Rhythm: Normal rate and regular rhythm.     Pulses: Normal pulses.     Heart sounds: Normal heart sounds.  Pulmonary:     Effort: Pulmonary effort is normal.     Breath sounds: Normal breath sounds.  Abdominal:     Palpations: Abdomen is soft. There is no mass.     Tenderness: There is no abdominal tenderness.     Comments: PEG hole healing  Lymphadenopathy:     Cervical: No cervical adenopathy.     Right cervical: No superficial cervical adenopathy.    Left cervical: No superficial cervical adenopathy.  Neurological:     General: No focal deficit present.     Mental Status: He is alert and oriented to person, place, and time.  Psychiatric:        Mood and Affect: Mood normal.        Behavior: Behavior normal.      LABORATORY DATA:  I have reviewed the labs as listed.  CBC Latest Ref Rng & Units 02/12/2020 11/12/2019 08/10/2019  WBC 4.0 - 10.5 K/uL 5.6 7.9 7.5  Hemoglobin 13.0 - 17.0 g/dL 13.1 12.7(L) 12.1(L)  Hematocrit 39.0 - 52.0 % 40.4 40.4 37.4(L)  Platelets 150 - 400 K/uL 278 352 314   CMP Latest Ref Rng & Units 02/12/2020 11/12/2019 10/20/2019  Glucose 70 - 99 mg/dL 93 97 -  BUN 6 - 20 mg/dL 9 10 -  Creatinine 0.61 - 1.24 mg/dL 0.99 1.00 1.00  Sodium 135 - 145 mmol/L 128(L) 136 -  Potassium 3.5 - 5.1 mmol/L 3.5 3.4(L) -  Chloride 98 - 111 mmol/L 93(L) 98 -  CO2 22 - 32 mmol/L 29 29 -  Calcium 8.9 - 10.3 mg/dL 8.5(L) 9.4 -  Total Protein 6.5 - 8.1 g/dL 6.6 7.9 -  Total Bilirubin 0.3 - 1.2 mg/dL 0.7 0.5 -  Alkaline Phos 38 - 126 U/L 335(H) 94 -  AST 15 - 41 U/L 171(H) 17 -  ALT 0 - 44 U/L 83(H) 13 -    DIAGNOSTIC IMAGING:  I have independently reviewed the scans  and discussed with the patient. CT Chest W Contrast  Result Date: 02/13/2020 CLINICAL DATA:  Head neck cancer surveillance EXAM: CT CHEST WITH CONTRAST TECHNIQUE: Multidetector CT imaging of the chest was performed during intravenous contrast administration. CONTRAST:  33m OMNIPAQUE IOHEXOL 300 MG/ML  SOLN COMPARISON:  PET-CT 11/13/2019 FINDINGS: Cardiovascular: No significant vascular findings. Normal heart size. No pericardial effusion. Mediastinum/Nodes: No axillary or supraclavicular adenopathy. No mediastinal or hilar adenopathy. No pericardial fluid. Esophagus normal. Lungs/Pleura: A 10 mm x 9 mm RIGHT upper lobe pulmonary nodule (image 43/4) compares to 7 mm x 7 mm on PET-CT 11/13/2019. This nodule was hypermetabolic on most recent PET-CT scan. Band of atelectasis in the RIGHT middle lobe appears benign. No change from prior. Within the LEFT lower lobe, round nodule measuring 4 mm (image 105/series 4). This nodule is present on comparison exam measuring 4 mm however was partially obscured due to  breathing artifact and atelectasis. Upper Abdomen: Adrenal glands are normal. No upper abdominal adenopathy. There is a lobular contour to the LEFT kidney measuring 3.1 x 2.4 cm (image 157/series 2). This contour abnormality is incompletely imaged but gives the impression of a renal mass. No contrast enhanced imaging for comparison available. Musculoskeletal: No aggressive osseous lesion IMPRESSION: 1. Interval enlargement of previous hypermetabolic nodule in the RIGHT upper lobe is concerning for pulmonary metastasis. 2. Nodule within the LEFT lower lobe appears similar but smaller. Concern for second focus of pulmonary metastasis. 3. Irregular contour of the LEFT kidney is incompletely imaged but gives the impression of renal mass. No comparison contrast cross-sectional imaging of the kidney is available. Recommend CT of the abdomen and pelvis without with contrast (renal protocol) differentiate between an  enhancing renal mass versus benign cortical lobulation. Electronically Signed   By: Suzy Bouchard M.D.   On: 02/13/2020 10:03   DG ESOPHAGUS DILATION  Result Date: 01/26/2020 ESOPHAGEAL DILATATION: Fluoroscopy was provided for use by the requesting physician.  No images were obtained for radiographic interpretation.    ASSESSMENT:  1. T3N2C supraglottic squamous cell carcinoma: -Laryngoscopy and biopsy on 01/02/2019, trach placed on 02/18/2019. -PET scan on 01/27/2019 showed hypermetabolic laryngeal lesion with hypermetabolic cervical metastatic disease bilaterally. -Weekly cisplatin and radiation started on 03/07/2019, many interruptions due to hospitalizations. -XRT completed on 05/08/2019 and last weekly cisplatin on 05/15/2019. -CT angio chest on 10/20/2019 showed 6 mm right upper lobe lung nodule increased in size from prior PET scan.  Resolved subpleural nodules laterally in the right upper lobe.  Subcentimeter hyperenhancing liver lesions. -PET scan on 11/13/2059 showed moderate hypermetabolism noted in the prior tracheostomy tract, likely granulation tissue.  No lymphadenopathy in the neck.  6 mm right upper lobe nodule is mildly hypermetabolic with SUV 3.90.  5 mm left axillary lymph node with SUV 3.49.  6.5 mm precarinal lymph node slightly enlarged from 5 mm.  Difficult to measure bilateral hilar lymph nodes are hypermetabolic.  2. Nutrition: -He has a PEG tube. He is using Osmolite 1.53 to 4 cans/day.  3.  CVA: -She had pontine stroke with large vessel occlusion. -He had bleeding from trach site while on Plavix which was held since 03/04/2019.   PLAN:  1. Supraglottic squamous cell carcinoma: -Reportedly evaluated by Dr. Benjamine Mola 1 month ago and laryngoscopy exam was normal. -Today no oropharyngeal masses.  No palpable adenopathy in the neck region. -We will continue to monitor closely. -Reviewed CT chest with contrast from 02/12/2020.  10 x 9 mm right upper lobe pulmonary nodule  increased from 7 x 7 mm on PET scan on 11/13/2019.  Left lower lobe 4 mm nodule is stable.  No other new lesions were seen.  2. Nutrition: -His feeding tube fell off 2 weeks ago. -He reports that he has not used his feeding tube since November when it was replaced. -He has follow-up with Dr. Rush Landmark for repeat EGD and dilatation. -He lost about 30 pounds in the last 3 months.  Reports decreased appetite.  He stopped drinking nutritional supplements because of diarrhea.  He is only eating 1 meal per day according to wife. -Also reports diarrhea after eating.  Imodium does not help. -We will start him on Marinol 5 mg twice daily for appetite. -We will also start him on Lomotil 2 tablets 3 times a day. -We will check stool for C. difficile. -We will reach out to Dr. Minerva Areola to see if any further testing needed.  3.  CVA: -Plavix is on hold.  He is on aspirin 81 mg daily.  4.  Elevated LFTs: -AST elevated at 171.  ALT is 83.  Alk phos is 335.  Possible causes include Lipitor 40 mg daily. -Recommend CT abdomen and pelvis with contrast. -RTC after the scan.   Orders placed this encounter:  No orders of the defined types were placed in this encounter.    [name], MD [facility] [phone]   I, Milinda Antis, am acting as a scribe for Dr. Sanda Linger.  I, [name] MD, have reviewed the above documentation for accuracy and completeness, and I agree with the above.

## 2020-02-18 ENCOUNTER — Other Ambulatory Visit (HOSPITAL_COMMUNITY): Payer: Self-pay

## 2020-02-18 ENCOUNTER — Telehealth (HOSPITAL_COMMUNITY): Payer: Self-pay

## 2020-02-18 ENCOUNTER — Telehealth: Payer: Self-pay | Admitting: Gastroenterology

## 2020-02-18 ENCOUNTER — Telehealth: Payer: Self-pay

## 2020-02-18 DIAGNOSIS — R197 Diarrhea, unspecified: Secondary | ICD-10-CM

## 2020-02-18 NOTE — Telephone Encounter (Signed)
See alternate note 02/18/20

## 2020-02-18 NOTE — Telephone Encounter (Signed)
-----   Message from Irving Copas., MD sent at 02/17/2020  5:23 PM EST ----- SK, Thanks for reaching out.  The acuity of the diarrhea if it just started occurring sounds infectious in etiology.  Certainly we can see how he does with the Lomotil in the interim while the C. difficile returns.  I would likely also consider trying to obtain a GI pathogen panel if possible, if it could be added onto the stool that he submitted.  It is possible if this is persisting or recurring that his PPI could be causing some issues but certainly is necessary at some medication dosing to be maintained.  For now I would try to keep it twice a day due to again the acuity of symptoms making this more likely infectious.  If things persist, then I can certainly try to add on a flexible sigmoidoscopy at time of EGD to rule out microscopic/lymphocytic colitis.  I didn't take biopsies at time of colonoscopy because it really was being done for colon cancer screening at the time).    Fleming Prill,  Go ahead and put in patient to undergo a flexible sigmoidoscopy at same time as his EGD (do not need to add any additional time to the procedure) but so we can have approval in case we need to do it.  I would let the patient know that he would need to do the flex sig preparation just in case but if his diarrhea resolves then he can hold on that the day prior/day of his procedure.  GM ----- Message ----- From: [name], MD Sent: 02/17/2020   5:16 PM EST To: Irving Copas., MD  GM, I think you are seeing our mutual patient for repeat EGD and dilatation. His PEG tube fell off 2 weeks ago.  He lost about 30 pounds in the last 3 months.  He reports having diarrhea after eating.  He is only eating 1 meal per day.  I am starting him on Marinol for appetite stimulation.  Also putting him on Lomotil 2 tablets 3 times a day.  We are also checking stool for C. difficile.  I would appreciate if any other suggestions for his  diarrhea. Thanks.

## 2020-02-18 NOTE — Telephone Encounter (Signed)
Pt's spouse is requesting to reschedule procedure scheduled at the hospital.

## 2020-02-18 NOTE — Telephone Encounter (Signed)
Flex has been added.  Amy do you need another ambulatory referral?   I spoke to the pt wife and made her aware of the add on for Flex.  We discussed the prep and she states she may have to reschedule if she cannot find a ride. She is going to call back on Friday.

## 2020-02-18 NOTE — Progress Notes (Signed)
gi

## 2020-02-19 ENCOUNTER — Other Ambulatory Visit (HOSPITAL_COMMUNITY): Payer: 59

## 2020-02-19 NOTE — Telephone Encounter (Signed)
The pt has been rescheduled to 3/28 at 9 am at [facility] test moved to 3/24 The pt and his wife have been advised and all information mailed to the home

## 2020-02-19 NOTE — Telephone Encounter (Signed)
Dr Rush Landmark please see the note from the pt's wife.  Do you want to call the pt and discuss or should I cancel?

## 2020-02-19 NOTE — Telephone Encounter (Signed)
Ok, all taken care of! Thanks

## 2020-02-19 NOTE — Telephone Encounter (Signed)
No, because there is only one date of service, there is only one amb referral needed.  Just give me the dx code for the Flex and I'll add it along with a note to the one that is there now.

## 2020-02-19 NOTE — Telephone Encounter (Signed)
Pt's wife [name] would like to cancel procedure scheduled on 2/21 at the hospital. She stated that pt is too weak to undergo procedure. She would like for pt to be put on a waiting list so he can be r/s before April.

## 2020-02-19 NOTE — Telephone Encounter (Signed)
Patty, I called and spoke with the patient and his wife. Patient seems to have had some progressive weakness as a result of the recent acute diarrhea.  I do not see that the stool studies that were ordered by Dr. Delton Coombes have been done yet however it is not clear that they are even able to get those in based on how weak he is at this time.  The patient's bowel movements have stopped as of this morning and he is not having progressive abdominal pain or nausea.  He just has no appetite.  Hopefully he can get increased amounts of fluids (Gatorade/Powerade/Pedialyte) in him over the course of the next 24 hours. I told him and his wife that if things do not improve or he develops symptoms of true presyncope or syncope that he needs to be brought into the hospital for further evaluation in the setting of what may be acute dehydration and a progressive failure to thrive. They do not want to come to the hospital today though I have told them about my concerns.  They will reevaluate where things are into tomorrow and over the weekend but hopefully if the diarrhea symptoms have abated that he will be able to get more strength by increasing his fluids and trying to eat more. We will cancel his upcoming endoscopy for next week and plan to reschedule him for March/very early April. Thanks. GM  FYI Dr. Delton Coombes

## 2020-02-19 NOTE — Telephone Encounter (Signed)
Thank you R19.7 diarrhea

## 2020-02-20 ENCOUNTER — Ambulatory Visit: Payer: Self-pay | Admitting: Radiation Oncology

## 2020-02-22 ENCOUNTER — Emergency Department (HOSPITAL_COMMUNITY)
Admission: EM | Admit: 2020-02-22 | Discharge: 2020-02-22 | Disposition: A | Discharge status: Home / Self Care | Payer: 59 | Attending: Emergency Medicine | Admitting: Emergency Medicine

## 2020-02-22 ENCOUNTER — Encounter (HOSPITAL_COMMUNITY): Payer: Self-pay | Admitting: *Deleted

## 2020-02-22 ENCOUNTER — Emergency Department (HOSPITAL_COMMUNITY): Payer: 59

## 2020-02-22 ENCOUNTER — Other Ambulatory Visit: Payer: Self-pay

## 2020-02-22 DIAGNOSIS — E860 Dehydration: Secondary | ICD-10-CM | POA: Diagnosis not present

## 2020-02-22 DIAGNOSIS — Z79899 Other long term (current) drug therapy: Secondary | ICD-10-CM | POA: Diagnosis not present

## 2020-02-22 DIAGNOSIS — Z7982 Long term (current) use of aspirin: Secondary | ICD-10-CM | POA: Diagnosis not present

## 2020-02-22 DIAGNOSIS — U071 COVID-19: Secondary | ICD-10-CM

## 2020-02-22 DIAGNOSIS — R5383 Other fatigue: Secondary | ICD-10-CM | POA: Insufficient documentation

## 2020-02-22 DIAGNOSIS — Z87891 Personal history of nicotine dependence: Secondary | ICD-10-CM | POA: Diagnosis not present

## 2020-02-22 DIAGNOSIS — Z20822 Contact with and (suspected) exposure to covid-19: Secondary | ICD-10-CM | POA: Insufficient documentation

## 2020-02-22 DIAGNOSIS — I10 Essential (primary) hypertension: Secondary | ICD-10-CM | POA: Diagnosis not present

## 2020-02-22 DIAGNOSIS — E876 Hypokalemia: Secondary | ICD-10-CM

## 2020-02-22 DIAGNOSIS — R531 Weakness: Secondary | ICD-10-CM | POA: Diagnosis present

## 2020-02-22 DIAGNOSIS — Z8521 Personal history of malignant neoplasm of larynx: Secondary | ICD-10-CM | POA: Insufficient documentation

## 2020-02-22 DIAGNOSIS — I2510 Atherosclerotic heart disease of native coronary artery without angina pectoris: Secondary | ICD-10-CM | POA: Insufficient documentation

## 2020-02-22 DIAGNOSIS — Z955 Presence of coronary angioplasty implant and graft: Secondary | ICD-10-CM | POA: Diagnosis not present

## 2020-02-22 LAB — RESP PANEL BY RT-PCR (FLU A&B, COVID) ARPGX2
Influenza A by PCR: NEGATIVE
Influenza B by PCR: NEGATIVE
SARS Coronavirus 2 by RT PCR: POSITIVE — AB

## 2020-02-22 LAB — CBC
HCT: 42.7 % (ref 39.0–52.0)
Hemoglobin: 14.4 g/dL (ref 13.0–17.0)
MCH: 32.2 pg (ref 26.0–34.0)
MCHC: 33.7 g/dL (ref 30.0–36.0)
MCV: 95.5 fL (ref 80.0–100.0)
Platelets: 178 10*3/uL (ref 150–400)
RBC: 4.47 MIL/uL (ref 4.22–5.81)
RDW: 16.3 % — ABNORMAL HIGH (ref 11.5–15.5)
WBC: 6.7 10*3/uL (ref 4.0–10.5)
nRBC: 0 % (ref 0.0–0.2)

## 2020-02-22 LAB — BASIC METABOLIC PANEL
Anion gap: 8 (ref 5–15)
BUN: 9 mg/dL (ref 6–20)
CO2: 25 mmol/L (ref 22–32)
Calcium: 7.9 mg/dL — ABNORMAL LOW (ref 8.9–10.3)
Chloride: 98 mmol/L (ref 98–111)
Creatinine, Ser: 0.89 mg/dL (ref 0.61–1.24)
GFR, Estimated: >60 mL/min (ref >60)
Glucose, Bld: 124 mg/dL — ABNORMAL HIGH (ref 70–99)
Potassium: 2.5 mmol/L — CL (ref 3.5–5.1)
Sodium: 131 mmol/L — ABNORMAL LOW (ref 135–145)

## 2020-02-22 MED ORDER — POTASSIUM CHLORIDE ER 10 MEQ PO TBCR: 10.0000 meq | EXTENDED_RELEASE_TABLET | Freq: Every day | ORAL | 0 refills | Status: DC

## 2020-02-22 MED ORDER — POTASSIUM CHLORIDE 20 MEQ PO PACK
60.0000 meq | PACK | Freq: Once | ORAL | Status: AC
  Administered 2020-02-22: 60 meq via ORAL
  Filled 2020-02-22: qty 3

## 2020-02-22 MED ORDER — DOXYCYCLINE HYCLATE 100 MG PO CAPS: 100.0000 mg | ORAL_CAPSULE | Freq: Two times a day (BID) | ORAL | 0 refills | Status: DC

## 2020-02-22 MED ORDER — POTASSIUM CHLORIDE CRYS ER 20 MEQ PO TBCR
40.0000 meq | EXTENDED_RELEASE_TABLET | Freq: Once | ORAL | Status: DC
  Filled 2020-02-22: qty 2

## 2020-02-22 MED ORDER — ONDANSETRON HCL 4 MG PO TABS: 4.0000 mg | ORAL_TABLET | Freq: Three times a day (TID) | ORAL | 0 refills | Status: DC | PRN

## 2020-02-22 NOTE — Discharge Instructions (Addendum)
You have been evaluated in the Emergency Department. You are Covid positive. Your chest XR also look suspicious for pneumonia. Take antibiotic and nausea medicine as directed. Your potassium was low, take supplemental potassium as prescribed. You are to isolate and quarantine yourself.  Please refer to the information attached with this packet. Follow up with your primary doctor tomorrow for further care. Call the [facility] clinic for to be evaluated for further treatment. Treat yourself symptomatically with over the counter medication as you would for a viral illness.  Take Tylenol and Ibuprofen for pain and fever control. Stay well hydrated. If you have any worsening or severe symptoms, difficulty breathing, concern for your health please return to the emergency department.

## 2020-02-22 NOTE — ED Notes (Signed)
Call to wife to let her know pt dx of COVID. Discharge instructions reviewed at this time and all questions answered. Pt VSS and he is agreeable to being discharged home

## 2020-02-22 NOTE — ED Notes (Signed)
Date and time results received: 02/22/20 1339   Test: Potassium Critical Value: 2.5  Name of Provider Notified: [facility]  Orders Received? Or Actions Taken?: Orders Received - See Orders for details

## 2020-02-22 NOTE — ED Triage Notes (Signed)
Pt reports he is not feeling well, c/o nausea, generalized weakness making it hard to ambulate at home and sweating. Pt with dx of throat CA last year and denies currently on chemo or radiation.

## 2020-02-22 NOTE — ED Provider Notes (Addendum)
[facility] EMERGENCY DEPARTMENT Provider Note   CSN: [id number] Arrival date & time: 02/22/20  1223     History Chief Complaint  Patient presents with  . Weakness    Jorge MOREFIELD is a 60 y.o. male.  HPI   60 year old male with past medical history of throat cancer not currently undergoing any chemo/radiation presents emergency department generalized weakness and decreased p.o. intake.  Patient is weak appearing, he is a minimal and reliable historian.  States for the past couple days he has been generally weak.  He has had a decreased appetite and decreased p.o. intake.  Denies any acute symptoms including fever, chest pain, cough, vomiting/diarrhea, abdominal pain. Complains general fatigue and dehydration. He is vaccinated for flu and COVID.  Past Medical History:  Diagnosis Date  . Anemia   . Anxiety   . Arthritis    back  . Bradycardia    a. During 11/2012 adm: lopressor decreased.  . Cancer of larynx ([place])   . Carotid artery stenosis   . 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 Coronary artery disease    a. Diag cath 10/2012 for CP/abnormal nuc -> planned PCI s/p LAD atherectomy/DES placement 11/05/12.  . Depression   . Dilated aortic root ([place])   . GERD (gastroesophageal reflux disease)   . History of hiatal hernia   . Hypercholesteremia   . Hypertension   . PEG tube malfunction ([place])   . Pneumonia   . Stroke ([place]) 07/31/2018   bilateral vertebrobasilar occlusion; "left side weaker thanit was before."    Patient Active Problem List   Diagnosis Date Noted  . PEG (percutaneous endoscopic gastrostomy) adjustment/replacement/removal ([place]) 08/11/2019  . PEG tube malfunction ([place]) 08/10/2019  . HCAP (healthcare-associated pneumonia) 04/10/2019  . Febrile illness, acute 04/08/2019  . Malnutrition of moderate degree 04/03/2019  . GI bleed 04/01/2019  . Dehydration 03/28/2019  . RLL  pneumonia 03/17/2019  . Hx of tracheostomy 02/18/2019  . Head and neck cancer ([place]) 02/18/2019  . 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 Stroke ([place]) 07/31/2018  . Rotator cuff syndrome of right shoulder 02/20/2013  . Bradycardia 11/06/2012  . Coronary artery disease   . Abnormal finding on cardiovascular stress test 09/25/2012  . Hypertension 09/03/2012  . Chest pain 09/03/2012  . Alcohol abuse, daily use 09/03/2012  . Hyperlipidemia 09/03/2012  . Hyponatremia 09/03/2012    Past Surgical History:  Procedure Laterality Date  . BIOPSY  11/24/2019   Procedure: BIOPSY;  Surgeon: Rush Landmark Telford Nab., MD;  Location: [place];  Service: Gastroenterology;;  . CARDIAC CATHETERIZATION  10/29/2012  . COLONOSCOPY WITH PROPOFOL N/A 11/24/2019   Procedure: COLONOSCOPY WITH PROPOFOL;  Surgeon: Rush Landmark Telford Nab., MD;  Location: [place];  Service: Gastroenterology;  Laterality: N/A;  . CORONARY ANGIOPLASTY WITH STENT PLACEMENT  11/05/2012  . DIRECT LARYNGOSCOPY N/A 01/02/2019   Procedure: MICRO DIRECT LARYNGOSCOPY BIOPSY OF LARYNGEAL MASS;  Surgeon: [name], MD;  Location: [facility];  Service: ENT;  Laterality: N/A;  . ESOPHAGOGASTRODUODENOSCOPY (EGD) WITH PROPOFOL N/A 08/11/2019   Procedure: ATTEMPTED ESOPHAGOGASTRODUODENOSCOPY (EGD) WITH PROPOFOL (UNABLE TO PERFORM PERCUTANEOUS ENDOSCOPIC GASTROSTOMY TUBE REPLACEMENT);  Surgeon: [name], MD;  Location: [facility];  Service: Gastroenterology;  Laterality: N/A;  . ESOPHAGOGASTRODUODENOSCOPY (EGD) WITH PROPOFOL N/A 11/24/2019   Procedure: ESOPHAGOGASTRODUODENOSCOPY (EGD) WITH PROPOFOL;  Surgeon: Rush Landmark Telford Nab., MD;  Location: [place];  Service: Gastroenterology;  Laterality: N/A;  . ESOPHAGOGASTRODUODENOSCOPY (EGD) WITH PROPOFOL N/A 01/26/2020   Procedure: ESOPHAGOGASTRODUODENOSCOPY (EGD) WITH PROPOFOL;  Surgeon: Rush Landmark Telford Nab., MD;  Location: [place];  Service: Gastroenterology;  Laterality:  N/A;  . HEMORRHOID SURGERY  ~ 2010  . INSERTION OF MESH N/A 06/02/2015   Procedure: INSERTION OF MESH;  Surgeon: [name], MD;  Location: [facility];  Service: General;  Laterality: N/A;  . IR ANGIO INTRA EXTRACRAN SEL COM CAROTID INNOMINATE BILAT MOD SED  08/02/2018  . IR ANGIO VERTEBRAL SEL VERTEBRAL BILAT MOD SED  08/02/2018  . IR REPLACE G-TUBE SIMPLE WO FLUORO  08/12/2019  . IR REPLC GASTRO/COLONIC TUBE PERCUT W/FLUORO  08/13/2019  . LAPAROSCOPIC INSERTION GASTROSTOMY TUBE N/A 02/24/2019   Procedure: LAPAROSCOPIC INSERTION GASTROSTOMY TUBE;  Surgeon: [name], MD;  Location: [place];  Service: General;  Laterality: N/A;  . LEFT HEART CATHETERIZATION WITH CORONARY ANGIOGRAM N/A 10/30/2012   Procedure: LEFT HEART CATHETERIZATION WITH CORONARY ANGIOGRAM;  Surgeon: [name], MD;  Location: [facility] [facility];  Service: Cardiovascular;  Laterality: N/A;  . MULTIPLE EXTRACTIONS WITH ALVEOLOPLASTY N/A 02/18/2019   Procedure: Extraction of tooth #'s 5-7, 10,11,14,20-29, 31 and 32 with alveoloplasty and maxiillary left buccal exostoses reductions;  Surgeon: [name], [name];  Location: [place];  Service: Oral Surgery;  Laterality: N/A;  . PEG PLACEMENT N/A 01/26/2020   Procedure: PERCUTANEOUS ENDOSCOPIC GASTROSTOMY (PEG) REPLACEMENT;  Surgeon: Irving Copas., MD;  Location: [place];  Service: Gastroenterology;  Laterality: N/A;  . PERCUTANEOUS CORONARY ROTOBLATOR INTERVENTION (PCI-R) N/A 11/05/2012   Procedure: PERCUTANEOUS CORONARY ROTOBLATOR INTERVENTION (PCI-R);  Surgeon: [name], MD;  Location: [facility];  Service: Cardiovascular;  Laterality: N/A;  . SAVORY DILATION N/A 11/24/2019   Procedure: SAVORY DILATION;  Surgeon: Irving Copas., MD;  Location: [place];  Service: Gastroenterology;  Laterality: N/A;  . SAVORY DILATION N/A 01/26/2020   Procedure: SAVORY DILATION;  Surgeon: Irving Copas., MD;  Location: [place];  Service: Gastroenterology;   Laterality: N/A;  . TRACHEOSTOMY TUBE PLACEMENT N/A 02/18/2019   Procedure: Tracheostomy;  Surgeon: [name], MD;  Location: [place];  Service: ENT;  Laterality: N/A;  . UMBILICAL HERNIA REPAIR N/A 06/02/2015   Procedure: UMBILICAL HERNIORRHAPHY WITH MESH;  Surgeon: [name], MD;  Location: [facility];  Service: General;  Laterality: N/A;       Family History  Problem Relation Age of Onset  . Hypertension Mother   . Heart disease Father   . Hypertension Father   . Heart disease Sister   . Hypertension Maternal Grandmother   . Hypertension Maternal Grandfather   . Hypertension Paternal Grandmother   . Hypertension Paternal Grandfather   . Pancreatic cancer Maternal Uncle   . Breast cancer Maternal Aunt   . Breast cancer Maternal Aunt   . Diabetes Paternal Uncle   . Diabetes Paternal Aunt     Social History   Tobacco Use  . Smoking status: Former Smoker    Packs/day: 3.00    Years: 32.00    Pack years: 96.00    Types: Cigarettes    Start date: 08/29/1977    Quit date: 11/29/2006    Years since quitting: 13.2  . Smokeless tobacco: Former Systems developer    Types: Chew    Quit date: 2003  . Tobacco comment: 11/05/2012 "chewed tobacco when I play ball; haven't chewed since age 64"  Vaping Use  . Vaping Use: Never used  Substance Use Topics  . Alcohol use: Yes  Alcohol/week: 12.0 standard drinks    Types: 12 Cans of beer per week    Comment: per wife [name] 6-7/week now as of 02/24/19  . Drug use: Not Currently    Types: Marijuana    Comment: Last use was on - denies on 04/08/19    Home Medications Prior to Admission medications   Medication Sig Start Date End Date Taking? Authorizing Provider  acetaminophen (TYLENOL) 325 MG tablet Take 2 tablets (650 mg total) by mouth every 6 (six) hours as needed for mild pain, fever or headache (or Fever >/= 101). Patient not taking: Reported on 02/17/2020 04/14/19   Roxan Hockey, MD  amLODipine (NORVASC) 5 MG tablet Take 5 mg by mouth daily.     [provider]  aspirin EC 81 MG tablet Take 81 mg by mouth daily.     [provider]  atorvastatin (LIPITOR) 40 MG tablet Take 40 mg by mouth at bedtime. 01/17/20   [provider]  diazepam (VALIUM) 10 MG tablet Place 10 mg into feeding tube 3 (three) times daily as needed for anxiety or sleep.  05/28/19   [provider]  diphenoxylate-atropine (LOMOTIL) 2.5-0.025 MG tablet Take 2 tablets by mouth in the morning, at noon, and at bedtime. 02/17/20   [name], MD  dronabinol (MARINOL) 5 MG capsule Take 1 capsule (5 mg total) by mouth 2 (two) times daily before a meal. 02/17/20   [name], MD  DULoxetine (CYMBALTA) 60 MG capsule Take 60 mg by mouth daily.    [provider]  famotidine (PEPCID) 20 MG tablet Take 20 mg by mouth at bedtime. 01/22/20   [provider]  HYDROcodone-acetaminophen (NORCO) 10-325 MG tablet Take 1 tablet by mouth every 4 (four) hours as needed. Patient not taking: Reported on 02/17/2020 06/02/15   [name], MD  lidocaine (XYLOCAINE) 2 % solution Patient: Mix 1part 2% viscous lidocaine, 1part H20. Swish & swallow 4mL of diluted mixture, 57min before meals and at bedtime, up to QID Patient not taking: Reported on 02/17/2020 03/10/19   Eppie Gibson, MD  loperamide (IMODIUM) 2 MG capsule Take 2 mg by mouth daily as needed for diarrhea or loose stools. Patient not taking: Reported on 02/17/2020    [provider]  metoprolol tartrate (LOPRESSOR) 25 MG tablet Take 12.5 mg by mouth 2 (two) times daily.    [provider]  nitroGLYCERIN (NITROSTAT) 0.4 MG SL tablet Place 1 tablet (0.4 mg total) under the tongue every 5 (five) minutes as needed for chest pain (CP or SOB). 07/27/14   Herminio Commons, MD  Nutritional Supplements (FEEDING SUPPLEMENT, OSMOLITE 1.5 CAL,) LIQD Give 2 cartons of osmolite 1.5 at 9:30am, 1:30, 5:30 and 1 carton at 9:30pm.  Flush with 79ml of water before and  110ml after each feeding. Meets 100% of patient needs. Send bolus supplies. Patient taking differently: Give 2 cartons of osmolite 1.5 at 9:30am, 1:30, 5:30 and 1 carton at 9:30pm.  Flush with 76ml of water before and 122ml after each feeding. Meets 100% of patient needs. Send bolus supplies. 03/19/19   Eppie Gibson, MD  Nutritional Supplements (PROSOURCE TF) LIQD Give 63ml 2 times per day via feeding tube.  Mix with 5ml of water.  Flush tube with 17ml of water, then place prosource and water mixture in tube and flush with 33ml of water after. Patient taking differently: Give 5ml 2 times per day via feeding tube.  Mix with 53ml of water.  Flush tube with 39ml  of water, then place prosource and water mixture in tube and flush with 92ml of water after. 03/19/19   Eppie Gibson, MD  omeprazole (PRILOSEC) 40 MG capsule Take 1 capsule (40 mg total) by mouth 2 (two) times daily before a meal. Patient taking differently: Take 40 mg by mouth daily. 01/26/20   Mansouraty, Telford Nab., MD  prochlorperazine (COMPAZINE) 10 MG tablet TAKE 1 TABLET(10 MG) BY MOUTH EVERY 6 HOURS AS NEEDED FOR NAUSEA Patient not taking: Reported on 02/17/2020 08/19/19   Glennie Isle, NP-C  traMADol (ULTRAM) 50 MG tablet Take 50-100 mg by mouth 4 (four) times daily as needed for moderate pain (pain.). 01/29/19   [provider]  Water For Irrigation, Sterile (FREE WATER) SOLN Place 100 mLs into feeding tube See admin instructions. Please Flush PEG tube with 1 ounce (30 ml) before giving to feed, and then flush with 3 ounces (49ml) after giving to feed--- up to 4 times a day 04/14/19   Roxan Hockey, MD    Allergies    Duloxetine and Prednisone  Review of Systems   Review of Systems  Constitutional: Positive for activity change, appetite change and fatigue. Negative for chills and fever.  HENT: Negative for congestion.   Eyes: Negative for visual disturbance.  Respiratory: Negative for shortness of breath.    Cardiovascular: Negative for chest pain.  Gastrointestinal: Negative for abdominal pain, diarrhea and vomiting.  Genitourinary: Negative for dysuria.  Skin: Negative for rash.  Neurological: Positive for headaches.    Physical Exam Updated Vital Signs BP 102/68   Pulse 61   Temp 98.6 F (37 C) (Oral)   Resp 19   Ht 6' (1.829 m)   Wt 50.8 kg   SpO2 97%   BMI 15.19 kg/m   Physical Exam Vitals and nursing note reviewed.  Constitutional:      General: He is not in acute distress.    Appearance: He is not toxic-appearing.     Comments: Weak and fatigued appearing  HENT:     Head: Normocephalic.     Mouth/Throat:     Mouth: Mucous membranes are moist.  Cardiovascular:     Rate and Rhythm: Normal rate.  Pulmonary:     Effort: Pulmonary effort is normal. No respiratory distress.  Abdominal:     General: There is no distension.     Palpations: Abdomen is soft.     Tenderness: There is no abdominal tenderness.  Musculoskeletal:        General: No deformity.  Skin:    General: Skin is warm.  Neurological:     Mental Status: He is alert and oriented to person, place, and time. Mental status is at baseline.  Psychiatric:        Mood and Affect: Mood normal.     ED Results / Procedures / Treatments   Labs (all labs ordered are listed, but only abnormal results are displayed) Labs Reviewed  BASIC METABOLIC PANEL - Abnormal; Notable for the following components:      Result Value   Sodium 131 (*)    Potassium 2.5 (*)    Glucose, Bld 124 (*)    Calcium 7.9 (*)    All other components within normal limits  CBC - Abnormal; Notable for the following components:   RDW 16.3 (*)    All other components within normal limits  RESP PANEL BY RT-PCR (FLU A&B, COVID) [id number]  URINALYSIS, ROUTINE W REFLEX MICROSCOPIC    EKG None  Radiology No  results found.  Procedures Procedures   Medications Ordered in ED Medications  potassium chloride SA (KLOR-CON) CR tablet 40  mEq (has no administration in time range)    ED Course  I have reviewed the triage vital signs and the nursing notes.  Pertinent labs & imaging results that were available during my care of the patient were reviewed by me and considered in my medical decision making (see chart for details).    MDM Rules/Calculators/A&P                          60 year old male with medical history of throat cancer not undergoing any active treatment presents the emergency department with decline in health and decreased p.o. intake.Initial BP reading was hypotensive, improved with readjustment but ivf started.  Patient other vitals are normal.  Initial blood work shows a potassium of 2.5 and hypocalcemia. H/o hypokalemia and hypocalcemia before. EKG doesn't show any acute findings in regards to hypok and hypoca, no U wave or change in intervals. Replacement potassium has been ordered and given, tolerated well.  Chest x-ray shows a new focal opacity in the right lower lung, could be suspicious for infiltrate/infection versus chronic finding. Patient denies acute coughing/phlegm production.   After IVF patient feels significantly better. He is sitting up, sipping on water, more conversational and appears energized. He has been tolerating PO without difficulty in the department.  His vitals are normal, he has no hypoxia on room air.  He looks improved and feels better.  Patient is Covid positive. On re evaluation patient feels well and motivated to go home. His hypokalemia has been addressed, most likely chronic down trending, we replaced here in the department, he is able to tolerate PO and agreeable to taking supplemental K at home.   Of note patient did prior have PEG tube that fell out, and since having dental work he is able to comfortably chew and PO now. I do not feel patient meets inpatient criteria for admission, he has had hypokalemia and hypocalcemia in the past, he was able to take oral replacement without  problem, he will follow with his primary for repeat potassium and  Calcium labs  and further replacement if needed.  We will place him on antibiotics for possible CAP given his chest x-ray finding, discussed with the patient Covid infection.  Educated the patient on Covid precautions, treatments and expectations. He will call his primary and the Covid clinic tomorrow in regards to qualifying for further treatment given that he is a complicated case with h/o throat cancer.  Patient is requesting to go home, stable for discharge and treatment as an outpatient. Patient agrees with the discharge plan/strict return precautions and verbalizes understanding.   Final Clinical Impression(s) / ED Diagnoses Final diagnoses:  None    Rx / DC Orders ED Discharge Orders    None       Lorelle Gibbs, DO 02/22/20 1522    Lorelle Gibbs, DO 02/22/20 1629    Sabreen Kitchen, Alvin Critchley, DO 02/22/20 1738    Krystel Fletchall, Alvin Critchley, DO 02/22/20 1943    Lorelle Gibbs, DO 02/22/20 1952

## 2020-02-23 ENCOUNTER — Telehealth (HOSPITAL_COMMUNITY): Payer: Self-pay

## 2020-02-23 ENCOUNTER — Other Ambulatory Visit: Payer: Self-pay | Admitting: Physician Assistant

## 2020-02-23 ENCOUNTER — Ambulatory Visit (HOSPITAL_COMMUNITY)
Admission: RE | Admit: 2020-02-23 | Discharge: 2020-02-23 | Disposition: A | Discharge status: Home / Self Care | Payer: 59 | Source: Ambulatory Visit | Attending: Pulmonary Disease | Admitting: Pulmonary Disease

## 2020-02-23 DIAGNOSIS — Z9889 Other specified postprocedural states: Secondary | ICD-10-CM

## 2020-02-23 DIAGNOSIS — J189 Pneumonia, unspecified organism: Secondary | ICD-10-CM | POA: Insufficient documentation

## 2020-02-23 DIAGNOSIS — U071 COVID-19: Secondary | ICD-10-CM

## 2020-02-23 DIAGNOSIS — C321 Malignant neoplasm of supraglottis: Secondary | ICD-10-CM

## 2020-02-23 DIAGNOSIS — F1010 Alcohol abuse, uncomplicated: Secondary | ICD-10-CM

## 2020-02-23 DIAGNOSIS — I25118 Atherosclerotic heart disease of native coronary artery with other forms of angina pectoris: Secondary | ICD-10-CM

## 2020-02-23 MED ORDER — METHYLPREDNISOLONE SODIUM SUCC 125 MG IJ SOLR: 125.0000 mg | Freq: Once | INTRAMUSCULAR | Status: DC | PRN

## 2020-02-23 MED ORDER — SOTROVIMAB 500 MG/8ML IV SOLN
500.0000 mg | Freq: Once | INTRAVENOUS | Status: AC
  Administered 2020-02-23: 500 mg via INTRAVENOUS

## 2020-02-23 MED ORDER — DIPHENHYDRAMINE HCL 50 MG/ML IJ SOLN: 50.0000 mg | Freq: Once | INTRAMUSCULAR | Status: DC | PRN

## 2020-02-23 MED ORDER — EPINEPHRINE 0.3 MG/0.3ML IJ SOAJ: 0.3000 mg | Freq: Once | INTRAMUSCULAR | Status: DC | PRN

## 2020-02-23 MED ORDER — SODIUM CHLORIDE 0.9 % IV SOLN: INTRAVENOUS | Status: DC | PRN

## 2020-02-23 MED ORDER — ALBUTEROL SULFATE HFA 108 (90 BASE) MCG/ACT IN AERS: 2.0000 | INHALATION_SPRAY | Freq: Once | RESPIRATORY_TRACT | Status: DC | PRN

## 2020-02-23 MED ORDER — FAMOTIDINE IN NACL 20-0.9 MG/50ML-% IV SOLN: 20.0000 mg | Freq: Once | INTRAVENOUS | Status: DC | PRN

## 2020-02-23 NOTE — Progress Notes (Signed)
I connected by phone with [name] on 02/23/2020 at 10:19 AM to discuss the potential use of a new treatment for mild to moderate COVID-19 viral infection in non-hospitalized patients.  This patient is a 60 y.o. male that meets the FDA criteria for Emergency Use Authorization of COVID monoclonal antibody sotrovimab.   Has a (+) direct SARS-CoV-2 viral test result  Has mild or moderate COVID-19   Is NOT hospitalized due to COVID-19  Is within 10 days of symptom onset  Has at least one of the high risk factor(s) for progression to severe COVID-19 and/or hospitalization as defined in EUA.  Specific high risk criteria : Cardiovascular disease or hypertension, Chronic Lung Disease and Other high risk medical condition per CDC:  unvaccianted, high SVI   I have spoken and communicated the following to the patient or parent/caregiver regarding COVID monoclonal antibody treatment:  1. FDA has authorized the emergency use for the treatment of mild to moderate COVID-19 in adults and pediatric patients with positive results of direct SARS-CoV-2 viral testing who are 52 years of age and older weighing at least 40 kg, and who are at high risk for progressing to severe COVID-19 and/or hospitalization.  2. The significant known and potential risks and benefits of COVID monoclonal antibody, and the extent to which such potential risks and benefits are unknown.  3. Information on available alternative treatments and the risks and benefits of those alternatives, including clinical trials.  4. Patients treated with COVID monoclonal antibody should continue to self-isolate and use infection control measures (e.g., wear mask, isolate, social distance, avoid sharing personal items, clean and disinfect "high touch" surfaces, and frequent handwashing) according to CDC guidelines.   5. The patient or parent/caregiver has the option to accept or refuse COVID monoclonal antibody treatment.  After reviewing  this information with the patient, the patient has agreed to receive one of the available covid 19 monoclonal antibodies and will be provided an appropriate fact sheet prior to infusion.  Sx onset 2/14. Set up for infusion on 2/21 @ 1:30pm. Directions given to [facility]. Pt is aware that insurance will be charged an infusion fee. Pt is unvaccinated.   [name] 02/23/2020 10:19 AM

## 2020-02-23 NOTE — Telephone Encounter (Signed)
Called to schedule US carotid. Pt has covid. Wife would like a call back at a later date to set up when he is better. AW

## 2020-02-23 NOTE — Telephone Encounter (Signed)
Accessed in Error

## 2020-02-23 NOTE — Progress Notes (Signed)
Patient reviewed Fact Sheet for Patients, Parents, and Caregivers for Emergency Use Authorization (EUA) of sotrovimab for the Treatment of Coronavirus. Patient also reviewed and is agreeable to the estimated cost of treatment. Patient is agreeable to proceed.   

## 2020-02-23 NOTE — Progress Notes (Signed)
Diagnosis: COVID-19  Physician: Dr. Patrick Wright  Procedure: Covid Infusion Clinic Med: Sotrovimab infusion - Provided patient with sotrovimab fact sheet for patients, parents, and caregivers prior to infusion.   Complications: No immediate complications noted  Discharge: Discharged home    

## 2020-02-23 NOTE — Discharge Instructions (Signed)
10 Things You Can Do to Manage Your COVID-19 Symptoms at Home If you have possible or confirmed COVID-19: 1. Stay home except to get medical care. 2. Monitor your symptoms carefully. If your symptoms get worse, call your healthcare provider immediately. 3. Get rest and stay hydrated. 4. If you have a medical appointment, call the healthcare provider ahead of time and tell them that you have or may have COVID-19. 5. For medical emergencies, call 911 and notify the dispatch personnel that you have or may have COVID-19. 6. Cover your cough and sneezes with a tissue or use the inside of your elbow. 7. Wash your hands often with soap and water for at least 20 seconds or clean your hands with an alcohol-based hand sanitizer that contains at least 60% alcohol. 8. As much as possible, stay in a specific room and away from other people in your home. Also, you should use a separate bathroom, if available. If you need to be around other people in or outside of the home, wear a mask. 9. Avoid sharing personal items with other people in your household, like dishes, towels, and bedding. 10. Clean all surfaces that are touched often, like counters, tabletops, and doorknobs. Use household cleaning sprays or wipes according to the label instructions. [email or web address] 07/18/2019 This information is not intended to replace advice given to you by your health care provider. Make sure you discuss any questions you have with your health care provider. Document Revised: 11/03/2019 Document Reviewed: 11/03/2019 Elsevier Patient Education  2021 [employer].  What types of side effects do monoclonal antibody drugs cause?  Common side effects  In general, the more common side effects caused by monoclonal antibody drugs include: . Allergic reactions, such as hives or itching . Flu-like signs and symptoms, including chills, fatigue, fever, and muscle aches and pains . Nausea, vomiting . Diarrhea . Skin  rashes . Low blood pressure  If you have any questions or concerns after the infusion please call the Advanced Practice Provider on call at [phone]. This number is ONLY intended for your use regarding questions or concerns about the infusion post-treatment side-effects.  Please do not provide this number to others for use. For return to work notes please contact your primary care provider.   If someone you know is interested in receiving treatment please have them contact their MD for a referral or visit [email or web address]

## 2020-02-25 ENCOUNTER — Ambulatory Visit (HOSPITAL_COMMUNITY): Payer: 59

## 2020-02-25 ENCOUNTER — Other Ambulatory Visit: Payer: Self-pay

## 2020-02-25 DIAGNOSIS — Z1329 Encounter for screening for other suspected endocrine disorder: Secondary | ICD-10-CM

## 2020-02-26 ENCOUNTER — Ambulatory Visit: Payer: 59 | Admitting: Cardiology

## 2020-02-27 ENCOUNTER — Ambulatory Visit: Payer: 59

## 2020-02-27 ENCOUNTER — Ambulatory Visit: Payer: 59 | Admitting: Radiation Oncology

## 2020-03-01 ENCOUNTER — Inpatient Hospital Stay (HOSPITAL_COMMUNITY): Payer: 59 | Admitting: Hematology

## 2020-03-02 ENCOUNTER — Other Ambulatory Visit: Payer: Self-pay | Admitting: *Deleted

## 2020-03-02 NOTE — Patient Outreach (Signed)
[facility]) Care Management  [place]  03/02/2020   [name] 16-Jan-1960 [id number]  Subjective: Telephone outreach for quarterly check ups and updates on pt cancer status.  Talked with [name] who informs me he is doing a little better. His feeding tube is out. His weight is being maintained. He does complain of some abdominal pain. He has had COVID since we last talked and that has postponed further talks with next step for oncology. He is coming out of his room more often and appears to be less depressed.  [name] mother and brother died in 03-06-22. She is grieving and trying to keep it together for [name] and the other members of the family.   Encounter Medications:  Outpatient Encounter Medications as of 03/02/2020  Medication Sig Note  . acetaminophen (TYLENOL) 325 MG tablet Take 2 tablets (650 mg total) by mouth every 6 (six) hours as needed for mild pain, fever or headache (or Fever >/= 101). (Patient not taking: No sig reported)   . amLODipine (NORVASC) 5 MG tablet Take 5 mg by mouth daily.   Marland Kitchen aspirin EC 81 MG tablet Take 81 mg by mouth daily.    Marland Kitchen atorvastatin (LIPITOR) 40 MG tablet Take 40 mg by mouth at bedtime.   . diazepam (VALIUM) 10 MG tablet Place 10 mg into feeding tube 3 (three) times daily as needed for anxiety or sleep.    . diphenoxylate-atropine (LOMOTIL) 2.5-0.025 MG tablet Take 2 tablets by mouth in the morning, at noon, and at bedtime. (Patient not taking: No sig reported)   . doxycycline (VIBRAMYCIN) 100 MG capsule Take 1 capsule (100 mg total) by mouth 2 (two) times daily.   Marland Kitchen dronabinol (MARINOL) 5 MG capsule Take 1 capsule (5 mg total) by mouth 2 (two) times daily before a meal.   . DULoxetine (CYMBALTA) 60 MG capsule Take 60 mg by mouth daily. (Patient not taking: No sig reported)   . famotidine (PEPCID) 20 MG tablet Take 20 mg by mouth at bedtime. (Patient not taking: No sig reported)   . HYDROcodone-acetaminophen  (NORCO) 10-325 MG tablet Take 1 tablet by mouth every 4 (four) hours as needed.   . lidocaine (XYLOCAINE) 2 % solution Patient: Mix 1part 2% viscous lidocaine, 1part H20. Swish & swallow 24mL of diluted mixture, 7min before meals and at bedtime, up to QID (Patient not taking: No sig reported)   . loperamide (IMODIUM) 2 MG capsule Take 2 mg by mouth daily as needed for diarrhea or loose stools. (Patient not taking: No sig reported)   . methylPREDNISolone (MEDROL DOSEPAK) 4 MG TBPK tablet Take 4 mg by mouth as directed. 02/22/2020: Has not started yet  . metoprolol tartrate (LOPRESSOR) 25 MG tablet Take 12.5 mg by mouth 2 (two) times daily.   . nitroGLYCERIN (NITROSTAT) 0.4 MG SL tablet Place 1 tablet (0.4 mg total) under the tongue every 5 (five) minutes as needed for chest pain (CP or SOB). 09/30/2019: On hand  . Nutritional Supplements (FEEDING SUPPLEMENT, OSMOLITE 1.5 CAL,) LIQD Give 2 cartons of osmolite 1.5 at 9:30am, 1:30, 5:30 and 1 carton at 9:30pm.  Flush with 10ml of water before and 186ml after each feeding. Meets 100% of patient needs. Send bolus supplies. (Patient not taking: No sig reported)   . Nutritional Supplements (PROSOURCE TF) LIQD Give 63ml 2 times per day via feeding tube.  Mix with 58ml of water.  Flush tube with 62ml of water, then place prosource and  water mixture in tube and flush with 33ml of water after. (Patient not taking: No sig reported)   . omeprazole (PRILOSEC) 40 MG capsule Take 1 capsule (40 mg total) by mouth 2 (two) times daily before a meal. (Patient taking differently: Take 40 mg by mouth daily.)   . ondansetron (ZOFRAN) 4 MG tablet Take 1 tablet (4 mg total) by mouth every 8 (eight) hours as needed for nausea or vomiting.   . potassium chloride (KLOR-CON) 10 MEQ tablet Take 1 tablet (10 mEq total) by mouth daily.   . prochlorperazine (COMPAZINE) 10 MG tablet TAKE 1 TABLET(10 MG) BY MOUTH EVERY 6 HOURS AS NEEDED FOR NAUSEA (Patient not taking: No sig reported)   .  traMADol (ULTRAM) 50 MG tablet Take 50-100 mg by mouth 4 (four) times daily as needed for moderate pain (pain.).   Marland Kitchen Water For Irrigation, Sterile (FREE WATER) SOLN Place 100 mLs into feeding tube See admin instructions. Please Flush PEG tube with 1 ounce (30 ml) before giving to feed, and then flush with 3 ounces (64ml) after giving to feed--- up to 4 times a day (Patient not taking: No sig reported)    No facility-administered encounter medications on file as of 03/02/2020.   Assessment:  Oropharangeal cancer  Goals Addressed            This Visit's Progress   . Maintain my quality of life as evidenced by getting back to doing things he hasn't done since he became sick.       Follow Up Date  07/01/20   Go Fishing. Meet up with friends for a meal. Play a game with the family.   Notes: Strive to exercise for at least 10 minutes everyday. Walking outdoors when weather permitting would be good for all around health improvement. Talk to your NP or MD as often as necessary and resume friendships that may have been on hold due to your treatment and COVID 19. 11/11/19 Pt sleeping most of the day, not doing usual activities he used to enjoy, very depressed. Wants to live out his life doing things, like drinking again as this is something that he values for his quality of life. Counseled that if there is ca and he doesn't want treatment, he will be free to do what he wants to do. The priority would be his safety and comfort. 12/03/19 Wife reports she feels he seems to be more hopeful since our last conversation. Encouraged outdoors activities with friends and family. 03/02/20 Wife notes small improvements in pts demeanor. He is getting out of his room more and interacting with family members. Encouraged now that the weather is getting nicer he should do the above mentioned activities to enjoy life!    . Manage My Emotions as evidenced by participating in family activites reported by wife.       Follow Up  Date 06/02/20   Encourage activities with the family or one on one activities for quality time.   Notes: Pt will call and get appt for evaluation of depression by the end of 30 days. 11/11/19 It does not appear that primary care has addressed pt depression or given new medication for same. Encouraged him to try to do some of the things he enjoys like getting outside and getting fresh air, taking a walk, listen to music. 12/03/19 Pt outlook improved due to ability to eat more and having family around on Thanksgiving. 03/02/20 Mrs. Landgrebe reports increased participation and less self isolation. He is  doing some things he hasn't done in awhile that he enjoys.    . Patient Stated       Goal started 09/03/19 HIGH Priority Estimated end date: 03/01/20, Renewed 03/02/20.  [name] agrees to participate with [facility] for support and ongoing health needs over the next 6 months by pariicipating in quarterly calls..   09/03/19 Talked with Mrs. Kovar today, pt ca recovery progressing, trach removed, scheduled for ST and other diagnostics this month. No problems for resolution today. 10/06/19 Talked with [name] reports continued depression, advised to made OV with primary care to assess and treat. Improving PO intake. CCS 11/11/19 - Pt and wife are worried about ca reoccurrence or mets. Pt exhibits continued signs of depression. States he would not want any further txs. Listened and supported him. 12/03/19 No signs of ca reoccurrence. Will touch base in another 3 months. Encouraged to call for questions and or support. 03/02/20 Although I do not speak with [name] on everycall, I speak with his wife who is able to make a good assessment and report back. Outreaches are very good for her as his primary care provider. She agrees to continue on with every 3 month check ins.        Plan: Mrs. Montellano and I decided it will be beneficial for me to continue quarterly check ins for general support. Next call will be at the  end of May.   Follow-up:  Patient agrees to Care Plan and Follow-up. Eulah Pont. Myrtie Neither, MSN, [facility] Gerontological Nurse Practitioner [facility] Care Management [phone]

## 2020-03-10 ENCOUNTER — Ambulatory Visit (HOSPITAL_COMMUNITY)
Admission: RE | Admit: 2020-03-10 | Discharge: 2020-03-10 | Disposition: A | Discharge status: Home / Self Care | Payer: 59 | Source: Ambulatory Visit | Attending: Hematology | Admitting: Hematology

## 2020-03-10 ENCOUNTER — Encounter (HOSPITAL_COMMUNITY): Payer: Self-pay | Admitting: Radiology

## 2020-03-10 ENCOUNTER — Other Ambulatory Visit: Payer: Self-pay

## 2020-03-10 ENCOUNTER — Inpatient Hospital Stay (HOSPITAL_COMMUNITY): Payer: 59 | Attending: Hematology

## 2020-03-10 DIAGNOSIS — Z87891 Personal history of nicotine dependence: Secondary | ICD-10-CM | POA: Insufficient documentation

## 2020-03-10 DIAGNOSIS — C321 Malignant neoplasm of supraglottis: Secondary | ICD-10-CM | POA: Insufficient documentation

## 2020-03-10 DIAGNOSIS — Z923 Personal history of irradiation: Secondary | ICD-10-CM | POA: Insufficient documentation

## 2020-03-10 DIAGNOSIS — R197 Diarrhea, unspecified: Secondary | ICD-10-CM | POA: Insufficient documentation

## 2020-03-10 LAB — CBC WITH DIFFERENTIAL/PLATELET
Abs Immature Granulocytes: 0.01 10*3/uL (ref 0.00–0.07)
Basophils Absolute: 0.1 10*3/uL (ref 0.0–0.1)
Basophils Relative: 1 %
Eosinophils Absolute: 0.2 10*3/uL (ref 0.0–0.5)
Eosinophils Relative: 5 %
HCT: 38.9 % — ABNORMAL LOW (ref 39.0–52.0)
Hemoglobin: 12.6 g/dL — ABNORMAL LOW (ref 13.0–17.0)
Immature Granulocytes: 0 %
Lymphocytes Relative: 35 %
Lymphs Abs: 1.6 10*3/uL (ref 0.7–4.0)
MCH: 32.4 pg (ref 26.0–34.0)
MCHC: 32.4 g/dL (ref 30.0–36.0)
MCV: 100 fL (ref 80.0–100.0)
Monocytes Absolute: 0.5 10*3/uL (ref 0.1–1.0)
Monocytes Relative: 11 %
Neutro Abs: 2.2 10*3/uL (ref 1.7–7.7)
Neutrophils Relative %: 48 %
Platelets: 234 10*3/uL (ref 150–400)
RBC: 3.89 MIL/uL — ABNORMAL LOW (ref 4.22–5.81)
RDW: 15.1 % (ref 11.5–15.5)
WBC: 4.5 10*3/uL (ref 4.0–10.5)
nRBC: 0 % (ref 0.0–0.2)

## 2020-03-10 LAB — COMPREHENSIVE METABOLIC PANEL
ALT: 12 U/L (ref 0–44)
AST: 18 U/L (ref 15–41)
Albumin: 3 g/dL — ABNORMAL LOW (ref 3.5–5.0)
Alkaline Phosphatase: 140 U/L — ABNORMAL HIGH (ref 38–126)
Anion gap: 8 (ref 5–15)
BUN: 9 mg/dL (ref 6–20)
CO2: 28 mmol/L (ref 22–32)
Calcium: 8.3 mg/dL — ABNORMAL LOW (ref 8.9–10.3)
Chloride: 100 mmol/L (ref 98–111)
Creatinine, Ser: 0.83 mg/dL (ref 0.61–1.24)
GFR, Estimated: >60 mL/min (ref >60)
Glucose, Bld: 87 mg/dL (ref 70–99)
Potassium: 2.9 mmol/L — ABNORMAL LOW (ref 3.5–5.1)
Sodium: 136 mmol/L (ref 135–145)
Total Bilirubin: 0.6 mg/dL (ref 0.3–1.2)
Total Protein: 6.3 g/dL — ABNORMAL LOW (ref 6.5–8.1)

## 2020-03-10 LAB — MAGNESIUM: Magnesium: 1.6 mg/dL — ABNORMAL LOW (ref 1.7–2.4)

## 2020-03-10 MED ORDER — IOHEXOL 300 MG/ML  SOLN
100.0000 mL | Freq: Once | INTRAMUSCULAR | Status: AC | PRN
  Administered 2020-03-10: 100 mL via INTRAVENOUS

## 2020-03-15 ENCOUNTER — Other Ambulatory Visit: Payer: Self-pay

## 2020-03-15 ENCOUNTER — Encounter (HOSPITAL_COMMUNITY): Payer: Self-pay | Admitting: Hematology

## 2020-03-15 ENCOUNTER — Inpatient Hospital Stay (HOSPITAL_BASED_OUTPATIENT_CLINIC_OR_DEPARTMENT_OTHER): Payer: 59 | Admitting: Hematology

## 2020-03-15 VITALS — BP 105/78 | HR 53 | Temp 97.7°F | Resp 14 | Wt 130.9 lb

## 2020-03-15 DIAGNOSIS — C321 Malignant neoplasm of supraglottis: Secondary | ICD-10-CM | POA: Diagnosis present

## 2020-03-15 DIAGNOSIS — R197 Diarrhea, unspecified: Secondary | ICD-10-CM | POA: Diagnosis not present

## 2020-03-15 DIAGNOSIS — Z87891 Personal history of nicotine dependence: Secondary | ICD-10-CM | POA: Diagnosis not present

## 2020-03-15 DIAGNOSIS — Z923 Personal history of irradiation: Secondary | ICD-10-CM | POA: Diagnosis not present

## 2020-03-15 NOTE — Patient Instructions (Signed)
[place] at [facility] Discharge Instructions  You were seen today by Dr. Delton Coombes. He went over your recent results and scans. You may proceed with your procedure with Dr. Rush Landmark on March 28. You will be schedule to have a CT scan of your chest and neck done before your next visit. Continue taking Marinol twice daily to improve your appetite and weight. Dr. Delton Coombes will see you back in 2 months for labs and follow up.   Thank you for choosing [place] at [facility] to provide your oncology and hematology care.  To afford each patient quality time with our provider, please arrive at least 15 minutes before your scheduled appointment time.   If you have a lab appointment with the [facility] please come in thru the Main Entrance and check in at the main information desk  You need to re-schedule your appointment should you arrive 10 or more minutes late.  We strive to give you quality time with our providers, and arriving late affects you and other patients whose appointments are after yours.  Also, if you no show three or more times for appointments you may be dismissed from the clinic at the providers discretion.     Again, thank you for choosing [facility].  Our hope is that these requests will decrease the amount of time that you wait before being seen by our physicians.       _____________________________________________________________  Should you have questions after your visit to [facility], please contact our office at [phone] between the hours of 8:00 a.m. and 4:30 p.m.  Voicemails left after 4:00 p.m. will not be returned until the following business day.  For prescription refill requests, have your pharmacy contact our office and allow 72 hours.    Cancer Center Support Programs:   > Cancer Support Group  2nd Tuesday of the month 1pm-2pm, Journey Room

## 2020-03-15 NOTE — Progress Notes (Signed)
[address] [place], [place] [address]   CLINIC:  Medical Oncology/Hematology  PCP:  [name], [place] / [place] [address] [phone]   REASON FOR VISIT:  Follow-up for supraglottic squamous cell carcinoma  PRIOR THERAPY: Chemoradiation with weekly cisplatin from 03/07/2019 to 05/15/2019  NGS Results: Not done  CURRENT THERAPY: Surveillance  BRIEF ONCOLOGIC HISTORY:  Oncology History  Malignant neoplasm of supraglottis ([place])  01/22/2019 Initial Diagnosis   Malignant neoplasm of supraglottis ([place])   01/30/2019 Cancer Staging   Staging form: Larynx - Supraglottis, AJCC 8th Edition - Clinical: Stage IVA (cT2, cN2c, cM0) - Signed by [name], MD on 01/30/2019   03/07/2019 -  Chemotherapy   The patient had palonosetron (ALOXI) injection 0.25 mg, 0.25 mg, Intravenous,  Once, 6 of 6 cycles Administration: 0.25 mg (03/13/2019), 0.25 mg (03/07/2019), 0.25 mg (03/27/2019), 0.25 mg (05/01/2019), 0.25 mg (05/08/2019), 0.25 mg (05/15/2019) CISplatin (PLATINOL) 79 mg in sodium chloride 0.9 % 250 mL chemo infusion, 40 mg/m2 = 79 mg, Intravenous,  Once, 6 of 6 cycles Administration: 79 mg (03/13/2019), 79 mg (03/07/2019), 79 mg (03/27/2019), 79 mg (05/01/2019), 79 mg (05/08/2019), 79 mg (05/15/2019) fosaprepitant (EMEND) 150 mg in sodium chloride 0.9 % 145 mL IVPB, 150 mg, Intravenous,  Once, 6 of 6 cycles Administration: 150 mg (03/13/2019), 150 mg (03/07/2019), 150 mg (03/27/2019), 150 mg (05/01/2019), 150 mg (05/08/2019), 150 mg (05/15/2019)  for chemotherapy treatment.      CANCER STAGING: Cancer Staging Malignant neoplasm of supraglottis [facility]) Staging form: Larynx - Supraglottis, AJCC 8th Edition - Clinical: Stage IVA (cT2, cN2c, cM0) - Signed by [name], MD on 01/30/2019   INTERVAL HISTORY:  [name], a 60 y.o. male, returns for routine follow-up of his supraglottic squamous cell carcinoma. Surya was last seen on  02/17/2020.   Today he is accompanied by his daughter and he reports feeling okay. He notes that he had watery diarrhea this past weekend, on 03/12 to 03/13, about 7-8 times per day, which was reportedly due to something he took OTC for his constipation. He continues taking Marinol which is helping bring up his appetite and denies having abdominal pain.  He is scheduled to have his EGD on 03/28 with Dr. Rush Landmark.   REVIEW OF SYSTEMS:  Review of Systems  Constitutional: Positive for appetite change (75%) and fatigue (75%). Negative for unexpected weight change.  Gastrointestinal: Positive for diarrhea (x7-8 episodes daily over weekend). Negative for abdominal pain.  Musculoskeletal: Positive for back pain (7/10 lower back pain).  All other systems reviewed and are negative.   PAST MEDICAL/SURGICAL HISTORY:  Past Medical History:  Diagnosis Date  . Anemia   . Anxiety   . Arthritis    back  . Bradycardia    a. During 11/2012 adm: lopressor decreased.  . Cancer of larynx ([place])   . Carotid artery stenosis   . 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 Coronary artery disease    a. Diag cath 10/2012 for CP/abnormal nuc -> planned PCI s/p LAD atherectomy/DES placement 11/05/12.  . Depression   . Dilated aortic root ([place])   . GERD (gastroesophageal reflux disease)   . History of hiatal hernia   . Hypercholesteremia   . Hypertension   . PEG tube malfunction ([place])   . Pneumonia   . Stroke [facility]) 07/31/2018   bilateral vertebrobasilar  occlusion; "left side weaker thanit was before."   Past Surgical History:  Procedure Laterality Date  . BIOPSY  11/24/2019   Procedure: BIOPSY;  Surgeon: Rush Landmark Telford Nab., MD;  Location: [facility];  Service: Gastroenterology;;  . CARDIAC CATHETERIZATION  10/29/2012  . COLONOSCOPY WITH PROPOFOL N/A 11/24/2019   Procedure: COLONOSCOPY WITH PROPOFOL;  Surgeon:  Rush Landmark Telford Nab., MD;  Location: [facility];  Service: Gastroenterology;  Laterality: N/A;  . CORONARY ANGIOPLASTY WITH STENT PLACEMENT  11/05/2012  . DIRECT LARYNGOSCOPY N/A 01/02/2019   Procedure: MICRO DIRECT LARYNGOSCOPY BIOPSY OF LARYNGEAL MASS;  Surgeon: [name], MD;  [facility];  Service: ENT;  Laterality: N/A;  . ESOPHAGOGASTRODUODENOSCOPY (EGD) WITH PROPOFOL N/A 08/11/2019   Procedure: ATTEMPTED ESOPHAGOGASTRODUODENOSCOPY (EGD) WITH PROPOFOL (UNABLE TO PERFORM PERCUTANEOUS ENDOSCOPIC GASTROSTOMY TUBE REPLACEMENT);  Surgeon: [name], MD;  Location: [facility];  Service: Gastroenterology;  Laterality: N/A;  . ESOPHAGOGASTRODUODENOSCOPY (EGD) WITH PROPOFOL N/A 11/24/2019   Procedure: ESOPHAGOGASTRODUODENOSCOPY (EGD) WITH PROPOFOL;  Surgeon: Rush Landmark Telford Nab., MD;  Location: [facility];  Service: Gastroenterology;  Laterality: N/A;  . ESOPHAGOGASTRODUODENOSCOPY (EGD) WITH PROPOFOL N/A 01/26/2020   Procedure: ESOPHAGOGASTRODUODENOSCOPY (EGD) WITH PROPOFOL;  Surgeon: Rush Landmark Telford Nab., MD;  Location: [facility];  Service: Gastroenterology;  Laterality: N/A;  . HEMORRHOID SURGERY  ~ 2010  . INSERTION OF MESH N/A 06/02/2015   Procedure: INSERTION OF MESH;  Surgeon: [name], MD;  Location: [facility];  Service: General;  Laterality: N/A;  . IR ANGIO INTRA EXTRACRAN SEL COM CAROTID INNOMINATE BILAT MOD SED  08/02/2018  . IR ANGIO VERTEBRAL SEL VERTEBRAL BILAT MOD SED  08/02/2018  . IR REPLACE G-TUBE SIMPLE WO FLUORO  08/12/2019  . IR REPLC GASTRO/COLONIC TUBE PERCUT W/FLUORO  08/13/2019  . LAPAROSCOPIC INSERTION GASTROSTOMY TUBE N/A 02/24/2019   Procedure: LAPAROSCOPIC INSERTION GASTROSTOMY TUBE;  Surgeon: [name], MD;  Location: [facility];  Service: General;  Laterality: N/A;  . LEFT HEART CATHETERIZATION WITH CORONARY ANGIOGRAM N/A 10/30/2012   Procedure: LEFT HEART CATHETERIZATION WITH CORONARY ANGIOGRAM;  Surgeon: [name], MD;  Location: [facility];  Service:  Cardiovascular;  Laterality: N/A;  . MULTIPLE EXTRACTIONS WITH ALVEOLOPLASTY N/A 02/18/2019   Procedure: Extraction of tooth #'s 5-7, 10,11,14,20-29, 31 and 32 with alveoloplasty and maxiillary left buccal exostoses reductions;  Surgeon: [name], [name];  Location: [facility];  Service: Oral Surgery;  Laterality: N/A;  . PEG PLACEMENT N/A 01/26/2020   Procedure: PERCUTANEOUS ENDOSCOPIC GASTROSTOMY (PEG) REPLACEMENT;  Surgeon: Irving Copas., MD;  Location: [facility];  Service: Gastroenterology;  Laterality: N/A;  . PERCUTANEOUS CORONARY ROTOBLATOR INTERVENTION (PCI-R) N/A 11/05/2012   Procedure: PERCUTANEOUS CORONARY ROTOBLATOR INTERVENTION (PCI-R);  Surgeon: [name], MD;  Location: [facility] CATH [facility];  Service: Cardiovascular;  Laterality: N/A;  . SAVORY DILATION N/A 11/24/2019   Procedure: SAVORY DILATION;  Surgeon: Irving Copas., MD;  Location: [facility];  Service: Gastroenterology;  Laterality: N/A;  . SAVORY DILATION N/A 01/26/2020   Procedure: SAVORY DILATION;  Surgeon: Irving Copas., MD;  Location: [facility];  Service: Gastroenterology;  Laterality: N/A;  . TRACHEOSTOMY TUBE PLACEMENT N/A 02/18/2019   Procedure: Tracheostomy;  Surgeon: [name], MD;  Location: [facility];  Service: ENT;  Laterality: N/A;  . UMBILICAL HERNIA REPAIR N/A 06/02/2015   Procedure: UMBILICAL HERNIORRHAPHY WITH MESH;  Surgeon: [name], MD;  Location[facility];  Service: General;  Laterality: N/A;    SOCIAL HISTORY:  Social History   Socioeconomic History  . Marital status: Married  Spouse name: [name]  . Number of children: 0  . Years of education: Not on file  . Highest education level: Not on file  Occupational History  . Occupation: Disabled    Comment: previously worked in Nurse, learning disability as an Investment banker, corporate  . Smoking status: Former Smoker    Packs/day: 3.00    Years: 32.00    Pack years: 96.00    Types: Cigarettes    Start date: 08/29/1977     Quit date: 11/29/2006    Years since quitting: 13.3  . Smokeless tobacco: Former Systems developer    Types: Chew    Quit date: 2003  . Tobacco comment: 11/05/2012 "chewed tobacco when I play ball; haven't chewed since age 40"  Vaping Use  . Vaping Use: Never used  Substance and Sexual Activity  . Alcohol use: Yes    Alcohol/week: 12.0 standard drinks    Types: 12 Cans of beer per week    Comment: per wife [name] 6-7/week now as of 02/24/19  . Drug use: Not Currently    Types: Marijuana    Comment: Last use was on - denies on 04/08/19  . Sexual activity: Not Currently  Other Topics Concern  . Not on file  Social History Narrative   Patient is disabled.  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  Patient previously 3 pack/day use.   Patient currently drinking 3-4 beers per day from 12 pack a day.   Patient denies use of chew or other illicit drugs.   Social Determinants of Health   Financial Resource Strain: Not on file  Food Insecurity: No Food Insecurity  . Worried About Charity fundraiser in the Last Year: Never true  . Ran Out of Food in the Last Year: Never true  Transportation Needs: Not on file  Physical Activity: Not on file  Stress: Not on file  Social Connections: Not on file  Intimate Partner Violence: Not on file    FAMILY HISTORY:  Family History  Problem Relation Age of Onset  . Hypertension Mother   . Heart disease Father   . Hypertension Father   . Heart disease Sister   . Hypertension Maternal Grandmother   . Hypertension Maternal Grandfather   . Hypertension Paternal Grandmother   . Hypertension Paternal Grandfather   . Pancreatic cancer Maternal Uncle   . Breast cancer Maternal Aunt   . Breast cancer Maternal Aunt   . Diabetes Paternal Uncle   . Diabetes Paternal Aunt     CURRENT MEDICATIONS:  Current Outpatient Medications  Medication Sig Dispense Refill  .  acetaminophen (TYLENOL) 325 MG tablet Take 2 tablets (650 mg total) by mouth every 6 (six) hours as needed for mild pain, fever or headache (or Fever >/= 101). 12 tablet 0  . amLODipine (NORVASC) 5 MG tablet Take 5 mg by mouth daily.    Marland Kitchen aspirin EC 81 MG tablet Take 81 mg by mouth daily.     Marland Kitchen atorvastatin (LIPITOR) 40 MG tablet Take 80 mg by mouth at bedtime.    . diazepam (VALIUM) 10 MG tablet Place 10 mg into feeding tube 3 (three) times daily as needed for anxiety or sleep.     . diphenoxylate-atropine (LOMOTIL) 2.5-0.025 MG tablet Take 2 tablets by mouth in the morning, at noon, and at bedtime. 90 tablet 0  . doxycycline (VIBRAMYCIN) 100 MG  capsule Take 1 capsule (100 mg total) by mouth 2 (two) times daily. 20 capsule 0  . dronabinol (MARINOL) 5 MG capsule Take 1 capsule (5 mg total) by mouth 2 (two) times daily before a meal. 60 capsule 2  . famotidine (PEPCID) 20 MG tablet Take 20 mg by mouth at bedtime. (Patient not taking: Reported on 03/15/2020)    . HYDROcodone-acetaminophen (NORCO) 10-325 MG tablet Take 1 tablet by mouth every 4 (four) hours as needed. 50 tablet 0  . loperamide (IMODIUM) 2 MG capsule Take 2 mg by mouth daily as needed for diarrhea or loose stools. (Patient not taking: No sig reported)    . metoprolol tartrate (LOPRESSOR) 25 MG tablet Take 12.5 mg by mouth 2 (two) times daily.    . nitroGLYCERIN (NITROSTAT) 0.4 MG SL tablet Place 1 tablet (0.4 mg total) under the tongue every 5 (five) minutes as needed for chest pain (CP or SOB). (Patient not taking: Reported on 03/02/2020) 25 tablet 3  . omeprazole (PRILOSEC) 40 MG capsule Take 1 capsule (40 mg total) by mouth 2 (two) times daily before a meal. (Patient taking differently: Take 40 mg by mouth daily.) 60 capsule 5  . ondansetron (ZOFRAN) 4 MG tablet Take 1 tablet (4 mg total) by mouth every 8 (eight) hours as needed for nausea or vomiting. 4 tablet 0  . potassium chloride (KLOR-CON) 10 MEQ tablet Take 1 tablet (10 mEq total)  by mouth daily. 7 tablet 0  . prochlorperazine (COMPAZINE) 10 MG tablet TAKE 1 TABLET(10 MG) BY MOUTH EVERY 6 HOURS AS NEEDED FOR NAUSEA 60 tablet 2  . traMADol (ULTRAM) 50 MG tablet Take 50-100 mg by mouth 4 (four) times daily as needed for moderate pain (pain.).     No current facility-administered medications for this visit.    ALLERGIES:  Allergies  Allergen Reactions  . Duloxetine Other (See Comments)    Caused depression/ patient is taking  . Prednisone Other (See Comments)    Chest pain    PHYSICAL EXAM:  Performance status (ECOG): 1 - Symptomatic but completely ambulatory  Vitals:   03/15/20 1054  BP: 105/78  Pulse: (!) 53  Resp: 14  Temp: 97.7 F (36.5 C)  SpO2: 100%   Wt Readings from Last 3 Encounters:  03/15/20 130 lb 14.4 oz (59.4 kg)  02/22/20 112 lb (50.8 kg)  02/17/20 131 lb 3.2 oz (59.5 kg)   Physical Exam Vitals reviewed.  Constitutional:      Appearance: Normal appearance.  Cardiovascular:     Rate and Rhythm: Normal rate and regular rhythm.     Pulses: Normal pulses.     Heart sounds: Normal heart sounds.  Pulmonary:     Effort: Pulmonary effort is normal.     Breath sounds: Normal breath sounds.  Neurological:     General: No focal deficit present.     Mental Status: He is alert and oriented to person, place, and time.  Psychiatric:        Mood and Affect: Mood normal.        Behavior: Behavior normal.      LABORATORY DATA:  I have reviewed the labs as listed.  CBC Latest Ref Rng & Units 03/10/2020 02/22/2020 02/12/2020  WBC 4.0 - 10.5 K/uL 4.5 6.7 5.6  Hemoglobin 13.0 - 17.0 g/dL 12.6(L) 14.4 13.1  Hematocrit 39.0 - 52.0 % 38.9(L) 42.7 40.4  Platelets 150 - 400 K/uL 234 178 278   CMP Latest Ref Rng & Units 03/10/2020 02/22/2020  02/12/2020  Glucose 70 - 99 mg/dL 87 124(H) 93  BUN 6 - 20 mg/dL _0 Creatinine 0.61 - 1.24 mg/dL 0.83 0.89 0.99  Sodium 135 - 145 mmol/L 136 131(L) 128(L)  Potassium 3.5 - 5.1 mmol/L 2.9(L) 2.5(LL) 3.5   Chloride 98 - 111 mmol/L 100 98 93(L)  CO2 22 - 32 mmol/L _1 Calcium 8.9 - 10.3 mg/dL 8.3(L) 7.9(L) 8.5(L)  Total Protein 6.5 - 8.1 g/dL 6.3(L) - 6.6  Total Bilirubin 0.3 - 1.2 mg/dL 0.6 - 0.7  Alkaline Phos 38 - 126 U/L 140(H) - 335(H)  AST 15 - 41 U/L 18 - 171(H)  ALT 0 - 44 U/L 12 - 83(H)    DIAGNOSTIC IMAGING:  I have independently reviewed the scans and discussed with the patient. CT Abdomen Pelvis W Contrast  Result Date: 03/11/2020 CLINICAL DATA:  Unintentional weight loss. History of prior supraglottic cancer. EXAM: CT ABDOMEN AND PELVIS WITH CONTRAST TECHNIQUE: Multidetector CT imaging of the abdomen and pelvis was performed using the standard protocol following bolus administration of intravenous contrast. CONTRAST:  187m OMNIPAQUE IOHEXOL 300 MG/ML  SOLN COMPARISON:  PET-CT 11/13/2019 and chest CT 02/12/2020 FINDINGS: Lower chest: Persistent area of atelectasis and/or scarring changes in the right middle lobe. Stable 4.5 mm nodule in the left lower lobe. No pleural effusions. The heart is normal in size. Stable age advanced coronary artery and aortic calcifications. Hepatobiliary: No hepatic lesions to suggest metastatic disease. Gallbladder is unremarkable. No mild stable common bile duct dilatation Pancreas: No mass, inflammation or ductal dilatation. Spleen: Normal size. No focal lesions. Small accessory spleen noted. Adrenals/Urinary Tract: The adrenal glands are normal. No renal lesions or renal calculi. Mild stable bilateral renal cortical scarring changes. Mild persistent diffuse bladder wall thickening similar to prior PET-CT. No discrete lesion. Stomach/Bowel: Stomach, duodenum, small bowel and colon are unremarkable. No acute inflammatory changes, mass lesions or obstructive findings. Vascular/Lymphatic: Significant age advanced atherosclerotic calcifications involving the aorta, iliac arteries and branch vessels. No focal aneurysm or dissection. The major venous  structures are patent. No mesenteric or retroperitoneal mass or adenopathy. Small scattered lymph nodes are stable. Reproductive: The prostate gland and seminal vesicles are unremarkable. Other: No pelvic mass or adenopathy. No free pelvic fluid collections. No inguinal mass or adenopathy. No abdominal wall hernia or subcutaneous lesions. Musculoskeletal: No significant bony findings. IMPRESSION: 1. No acute abdominal/pelvic findings, mass lesions or lymphadenopathy. 2. Stable mild diffuse bladder wall thickening. No discrete lesion. 3. Stable 4.5 mm left lower lobe pulmonary nodule and right middle lobe atelectasis/scarring. 4. Significant age advanced atherosclerotic calcifications involving the aorta, iliac arteries and branch vessels. 5. Aortic atherosclerosis. Aortic Atherosclerosis (ICD10-I70.0). Electronically Signed   By: PMarijo SanesM.D.   On: 03/11/2020 16:20   DG Chest Port 1 View  Result Date: 02/22/2020 CLINICAL DATA:  Cough.  History of throat cancer. EXAM: PORTABLE CHEST 1 VIEW COMPARISON:  April 08, 2019 chest x-ray.  CT scan February 12, 2020. FINDINGS: There is an opacity in the right base which is somewhat masslike, new since April 08, 2019. This could represent the atelectasis seen in the right middle lobe on the CT scan from February 12, 2020 but that is not certain. The suspected metastasis in the right upper lobe on recent CT imaging cannot be seen on this x-ray. The heart, hila, mediastinum, lungs, and pleura are otherwise unremarkable. IMPRESSION: 1. There is a focal opacity in the right base new since April of 2021. This  finding could correlate with a region of atelectasis in the right middle lobe seen on the CT scan from February of 2022. A developing neoplasm or infiltrate are not excluded. If the patient has symptoms suggesting infection, recommend treatment with a short-term follow-up x-ray in 2 or 3 weeks. Electronically Signed   By: Dorise Bullion III M.D   On: 02/22/2020 14:37      ASSESSMENT:  1. T3N2C supraglottic squamous cell carcinoma: -Laryngoscopy and biopsy on 01/02/2019, trach placed on 02/18/2019. -PET scan on 01/27/2019 showed hypermetabolic laryngeal lesion with hypermetabolic cervical metastatic disease bilaterally. -Weekly cisplatin and radiation started on 03/07/2019, many interruptions due to hospitalizations. -XRT completed on 05/08/2019 and last weekly cisplatin on 05/15/2019. -CT angio chest on 10/20/2019 showed 6 mm right upper lobe lung nodule increased in size from prior PET scan. Resolved subpleural nodules laterally in the right upper lobe. Subcentimeter hyperenhancing liver lesions. -PET scan on 11/13/2059 showed moderate hypermetabolism noted in the prior tracheostomy tract, likely granulation tissue. No lymphadenopathy in the neck. 6 mm right upper lobe nodule is mildly hypermetabolic with SUV 5.18. 5 mm left axillary lymph node with SUV 3.49. 6.5 mm precarinal lymph node slightly enlarged from 5 mm. Difficult to measure bilateral hilar lymph nodes are hypermetabolic.  2. Nutrition: -He has a PEG tube. He is using Osmolite 1.53 to 4 cans/day.  3. CVA: -She had pontine stroke with large vessel occlusion. -He had bleeding from trach site while on Plavix which was held since 03/04/2019.   PLAN:  1. Supraglottic squamous cell carcinoma: -He does not have any oropharyngeal lesions.  No neck adenopathy palpable. -He will continue follow-up with Dr. Benjamine Mola and Dr. Isidore Moos. -I plan to see him back with CT scan of the neck in 2 months along with a TSH level. -Prior CT chest with contrast on 02/12/2020 showed 10 x 9 mm right upper lobe pulmonary nodule increased from 7 x 7 mm on PET scan from 11/13/2019.  Left lower lobe nodule is stable. -Would also recommend repeating chest CT scan in 2 months.  2. Nutrition: -He is taking Marinol 5 mg twice daily which was started at last visit. -He reportedly gained some weight and had diarrhea over the  weekend and lost some weight.  Overall his weight has been stable since last visit. -At last visit I have reached out to Dr. Rush Landmark who had recommended doing stool studies.  He did not submit the stool sample as his diarrhea improved.  However he had some episode of diarrhea over the weekend which also subsided at this time. -He has follow-up with Dr. Rush Landmark for EGD.  3. CVA: -Plavix is on hold.  He is on aspirin 81 mg daily.  4.  Elevated LFTs: -His AST and ALT have normalized although alk phos is remaining elevated. -I reviewed CTAP from 03/10/2020 which did not show any acute abdominal or pelvic findings, mass lesions or lymphadenopathy.  Stable mild diffuse bladder wall thickening with no discrete lesion.  Stable 4.5 mm left lower lobe pulmonary nodule and right middle lobe atelectasis.  No hepatic lesions seen.   Orders placed this encounter:  Orders Placed This Encounter  Procedures  . CT Chest W Contrast  . CT SOFT TISSUE NECK W CONTRAST     [name], MD [place] [phone]   I, Milinda Antis, am acting as a scribe for Dr. Sanda Linger.  I, [name] MD, have reviewed the above documentation for accuracy and completeness, and I agree  with the above.     

## 2020-03-22 ENCOUNTER — Other Ambulatory Visit: Payer: Self-pay

## 2020-03-22 ENCOUNTER — Encounter (HOSPITAL_COMMUNITY): Payer: Self-pay | Admitting: Gastroenterology

## 2020-03-22 ENCOUNTER — Ambulatory Visit: Payer: 59 | Admitting: Adult Health

## 2020-03-22 NOTE — Progress Notes (Deleted)
[facility] [address] [place]. [place] [address] [phone]       OFFICE FOLLOW UP NOTE  [name] Date of Birth:  [birth year] Medical Record Number:  [id number]   Reason for Referral: Pontine stroke follow up    CHIEF COMPLAINT:  No chief complaint on file.   HPI:   Today, 03/22/2020, [name] returns for prolonged 25-month stroke follow-up.  Stable from stroke standpoint since prior visit without new stroke/TIA symptoms  Compliant on aspirin 81 mg daily and atorvastatin 40 mg daily -denies side effects Blood pressure today ***          History provided for reference purposes only Update 06/24/2019 JM: [name] returns for follow-up regarding bilateral pontine stroke in 07/2018.  He has been doing well from a stroke standpoint since prior visit without new or worsening stroke/TIA symptoms. He report occasional visual and gait impairment but this has been stable without worsening.  He currently transfers via wheelchair for long distance due to multiple hospitalizations and recently completing chemoradiation (additional information below).  Remains on aspirin 81 mg daily and atorvastatin 80 mg daily for secondary stroke prevention.  Blood pressure today 115/81.  He did follow-up with IR Dr. Estanislado Pandy with carotid ultrasound 02/2019 which showed right ICA 40 to 59% stenosis and left ICA 1 to 39% stenosis and bilateral VAs demonstrate antegrade flow and recommended follow-up in 6 months.  No neurological or stroke related concerns at this time.  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 He recently completed chemo last month and continues to follow with oncology regularly.   Update 12/24/2018 JM: [name] is a 60 year old male who is being seen today for stroke follow-up.  Residual deficits of occasional balance difficulties and occasional visual impairment.  He has since completed PT with improvement of balance.  He does endorse ongoing left hip pain with recommendation of surgery but patient declined as there is a chance he can be paralyzed.  He recently obtained with glasses which have helped stabilize vision.  He has completed recommended 60-month DAPT and continues on Plavix alone with mild bruising but no bleeding.  Continues on atorvastatin 80 mg daily without myalgias.  Blood pressure today 153/97.  Vascular surgery recommended repeat carotid ultrasound 6 months post discharge.  Denies new or worsening stroke/TIA symptoms.  Initial visit 09/10/2018: [name] is being seen today for hospital follow-up accompanied by his wife.  Residual deficits of mild blurring of vision, dizziness, balance difficulties and weakness in both legs.  He does endorse overall improvement but does continue to have difficulties with daily functioning.  Therapy was not recommended at discharge.  He has not returned to work as he was previously working as an Clinical biochemist and currently in the process of applying for [place] disability due to difficulties with ambulation and intolerance to heat.  He continues on aspirin 325 mg daily and clopidogrel 75 mg daily without bleeding or bruising.  Continues on atorvastatin 80 mg daily without myalgias.  Blood pressure today 166/94.  He does monitor at home and typically around 140s. He  is questioning undergoing direct laryngoscopy with biopsy of laryngeal mass which is  currently scheduled on 10/01/2018.  Per patient report, ongoing lymph node issues for the past 6-7 months with undergoing laryngoscopy for further evaluation.  Ongoing alcohol use approximately 4-6 beers daily and occasional THC use which wife reports he uses for self-medicating for anxiety or stress. Denies new or worsening stroke/TIA symptoms.  Stroke admission 07/31/2018: [name] is a 60 y.o. male with history of chronic LBP, HTN, HLD, CAD presented to [facility] ED on 07/31/2018 with blurred vision, ataxia, incoordination, and weakness in legs.  Transferred to [facility] for further evaluation/management.  MRI showed R > L pontine infarcts.  MRA showed occlusion bilateral VA's.  CTA head/neck showed bilateral V4 stenosis with occlusion distal VA and BA distally, right ICA 70% and left ICA 65% narrowing, distal left ICA 60% at cervical petrous junction, and severe ICA atherosclerosis, left CCA 40 to 50% stenosis, right VA 50% stenosis, bilateral V2 60 to 70% stenosis and left subclavian 65% stenosis.  Underwent cerebral angiogram by Dr. 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  No further interventions recommended at that time and recommended follow-up with vascular surgery outpatient.  2D echo normal EF with mild LVH and diastolic dysfunction with trivial aortic regurgitation.  LDL 101 and A1c 5.4.  UDS positive for opiates, benzos and THC.  Recommended DAPT for 3 months then Plavix alone due to intracranial stenosis.  BP as high as 244/114 during admission and recommended long-term BP goal 1 30-1 60 given severe vascular stenosis.  Recommended continuation of atorvastatin 80 mg daily.  Heavy EtOH use and substance use with cessation counseling provided.  Other stroke risk factors include former tobacco use, family history of stroke and CAD s/p DES 2014.  He was discharged home in stable condition without therapy needs as he returned to  baseline.    ROS:   All 14 systems reviewed with patient concerns of the following and all other negative blurred vision,  easy bruising, pain, balance issues    PMH:  Past Medical History:  Diagnosis Date  . Anemia   . Anxiety   . Arthritis    back  . Bradycardia    a. During 11/2012 adm: lopressor decreased.  . Cancer of larynx ([place])   . Carotid artery stenosis   . 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 Coronary artery disease    a. Diag cath 10/2012 for CP/abnormal nuc -> planned PCI s/p LAD atherectomy/DES placement 11/05/12.  . Depression   . Dilated aortic root ([place])   . GERD (gastroesophageal reflux disease)   . History of hiatal hernia   . Hypercholesteremia   . Hypertension   . PEG tube malfunction ([place])   . Pneumonia   . Stroke ([place]) 07/31/2018   bilateral vertebrobasilar occlusion; "left side weaker thanit was before."    PSH:  Past Surgical History:  Procedure Laterality Date  . BIOPSY  11/24/2019   Procedure: BIOPSY;  Surgeon: Rush Landmark Telford Nab., MD;  Location: [place];  Service: Gastroenterology;;  . CARDIAC CATHETERIZATION  10/29/2012  . COLONOSCOPY WITH PROPOFOL N/A 11/24/2019   Procedure: COLONOSCOPY WITH PROPOFOL;  Surgeon: Rush Landmark Telford Nab., MD;  Location: [place];  Service: Gastroenterology;  Laterality: N/A;  . CORONARY ANGIOPLASTY WITH STENT PLACEMENT  11/05/2012  . DIRECT LARYNGOSCOPY N/A  01/02/2019   Procedure: MICRO DIRECT LARYNGOSCOPY BIOPSY OF LARYNGEAL MASS;  Surgeon: [name], MD;  Location: [place];  Service: ENT;  Laterality: N/A;  . ESOPHAGOGASTRODUODENOSCOPY (EGD) WITH PROPOFOL N/A 08/11/2019   Procedure: ATTEMPTED ESOPHAGOGASTRODUODENOSCOPY (EGD) WITH PROPOFOL (UNABLE TO PERFORM PERCUTANEOUS ENDOSCOPIC GASTROSTOMY TUBE REPLACEMENT);  Surgeon: [name], MD;  Location: [facility];  Service: Gastroenterology;  Laterality: N/A;  .  ESOPHAGOGASTRODUODENOSCOPY (EGD) WITH PROPOFOL N/A 11/24/2019   Procedure: ESOPHAGOGASTRODUODENOSCOPY (EGD) WITH PROPOFOL;  Surgeon: Rush Landmark Telford Nab., MD;  Location: [place];  Service: Gastroenterology;  Laterality: N/A;  . ESOPHAGOGASTRODUODENOSCOPY (EGD) WITH PROPOFOL N/A 01/26/2020   Procedure: ESOPHAGOGASTRODUODENOSCOPY (EGD) WITH PROPOFOL;  Surgeon: Rush Landmark Telford Nab., MD;  Location: [place];  Service: Gastroenterology;  Laterality: N/A;  . HEMORRHOID SURGERY  ~ 2010  . INSERTION OF MESH N/A 06/02/2015   Procedure: INSERTION OF MESH;  Surgeon: [name], MD;  Location: [facility];  Service: General;  Laterality: N/A;  . IR ANGIO INTRA EXTRACRAN SEL COM CAROTID INNOMINATE BILAT MOD SED  08/02/2018  . IR ANGIO VERTEBRAL SEL VERTEBRAL BILAT MOD SED  08/02/2018  . IR REPLACE G-TUBE SIMPLE WO FLUORO  08/12/2019  . IR REPLC GASTRO/COLONIC TUBE PERCUT W/FLUORO  08/13/2019  . LAPAROSCOPIC INSERTION GASTROSTOMY TUBE N/A 02/24/2019   Procedure: LAPAROSCOPIC INSERTION GASTROSTOMY TUBE;  Surgeon: [name], MD;  Location: [place];  Service: General;  Laterality: N/A;  . LEFT HEART CATHETERIZATION WITH CORONARY ANGIOGRAM N/A 10/30/2012   Procedure: LEFT HEART CATHETERIZATION WITH CORONARY ANGIOGRAM;  Surgeon: [name], MD;  Location: [place] [facility];  Service: Cardiovascular;  Laterality: N/A;  . MULTIPLE EXTRACTIONS WITH ALVEOLOPLASTY N/A 02/18/2019   Procedure: Extraction of tooth #'s 5-7, 10,11,14,20-29, 31 and 32 with alveoloplasty and maxiillary left buccal exostoses reductions;  Surgeon: [name], [name];  Location: [place];  Service: Oral Surgery;  Laterality: N/A;  . PEG PLACEMENT N/A 01/26/2020   Procedure: PERCUTANEOUS ENDOSCOPIC GASTROSTOMY (PEG) REPLACEMENT;  Surgeon: Irving Copas., MD;  Location: [place];  Service: Gastroenterology;  Laterality: N/A;  . PERCUTANEOUS CORONARY ROTOBLATOR INTERVENTION (PCI-R) N/A 11/05/2012   Procedure: PERCUTANEOUS CORONARY  ROTOBLATOR INTERVENTION (PCI-R);  Surgeon: [name], MD;  Location: [facility];  Service: Cardiovascular;  Laterality: N/A;  . SAVORY DILATION N/A 11/24/2019   Procedure: SAVORY DILATION;  Surgeon: Irving Copas., MD;  Location: [place];  Service: Gastroenterology;  Laterality: N/A;  . SAVORY DILATION N/A 01/26/2020   Procedure: SAVORY DILATION;  Surgeon: Irving Copas., MD;  Location: [place];  Service: Gastroenterology;  Laterality: N/A;  . TRACHEOSTOMY TUBE PLACEMENT N/A 02/18/2019   Procedure: Tracheostomy;  Surgeon: [name], MD;  Location: [place];  Service: ENT;  Laterality: N/A;  . UMBILICAL HERNIA REPAIR N/A 06/02/2015   Procedure: UMBILICAL HERNIORRHAPHY WITH MESH;  Surgeon: [name], MD;  Location: [facility];  Service: General;  Laterality: N/A;    Social History:  Social History   Socioeconomic History  . Marital status: Married    Spouse name: [name]  . Number of children: 0  . Years of education: Not on file  . Highest education level: Not on file  Occupational History  . Occupation: Disabled    Comment: previously worked in Nurse, learning disability as an Investment banker, corporate  . Smoking status: Former Smoker    Packs/day: 3.00    Years: 32.00    Pack years: 96.00    Types: Cigarettes    Start date: 08/29/1977    Quit  date: 11/29/2006    Years since quitting: 13.3  . Smokeless tobacco: Former Systems developer    Types: Chew    Quit date: 2003  . Tobacco comment: 11/05/2012 "chewed tobacco when I play ball; haven't chewed since age 54"  Vaping Use  . Vaping Use: Never used  Substance and Sexual Activity  . Alcohol use: Yes    Alcohol/week: 12.0 standard drinks    Types: 12 Cans of beer per week    Comment: per wife [name] 6-7/week now as of 02/24/19  . Drug use: Not Currently    Types: Marijuana    Comment: Last use was on - denies on 04/08/19  . Sexual activity: Not Currently  Other Topics Concern  . Not on file  Social History  Narrative   Patient is disabled.  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  Patient previously 3 pack/day use.   Patient currently drinking 3-4 beers per day from 12 pack a day.   Patient denies use of chew or other illicit drugs.   Social Determinants of Health   Financial Resource Strain: Not on file  Food Insecurity: No Food Insecurity  . Worried About Charity fundraiser in the Last Year: Never true  . Ran Out of Food in the Last Year: Never true  Transportation Needs: Not on file  Physical Activity: Not on file  Stress: Not on file  Social Connections: Not on file  Intimate Partner Violence: Not on file    Family History:  Family History  Problem Relation Age of Onset  . Hypertension Mother   . Heart disease Father   . Hypertension Father   . Heart disease Sister   . Hypertension Maternal Grandmother   . Hypertension Maternal Grandfather   . Hypertension Paternal Grandmother   . Hypertension Paternal Grandfather   . Pancreatic cancer Maternal Uncle   . Breast cancer Maternal Aunt   . Breast cancer Maternal Aunt   . Diabetes Paternal Uncle   . Diabetes Paternal Aunt     Medications:   Current Outpatient Medications on File Prior to Visit  Medication Sig Dispense Refill  . acetaminophen (TYLENOL) 325 MG tablet Take 2 tablets (650 mg total) by mouth every 6 (six) hours as needed for mild pain, fever or headache (or Fever >/= 101). 12 tablet 0  . amLODipine (NORVASC) 5 MG tablet Take 5 mg by mouth daily.    Marland Kitchen aspirin EC 81 MG tablet Take 81 mg by mouth daily.     Marland Kitchen atorvastatin (LIPITOR) 40 MG tablet Take 80 mg by mouth at bedtime.    . diazepam (VALIUM) 10 MG tablet Place 10 mg into feeding tube 3 (three) times daily as needed for anxiety or sleep.     . diphenoxylate-atropine (LOMOTIL) 2.5-0.025 MG tablet Take 2 tablets by mouth in the morning, at noon, and at bedtime. 90  tablet 0  . doxycycline (VIBRAMYCIN) 100 MG capsule Take 1 capsule (100 mg total) by mouth 2 (two) times daily. 20 capsule 0  . dronabinol (MARINOL) 5 MG capsule Take 1 capsule (5 mg total) by mouth 2 (two) times daily before a meal. 60 capsule 2  . famotidine (PEPCID) 20 MG tablet Take 20 mg by mouth at bedtime. (Patient not taking: Reported on 03/15/2020)    . HYDROcodone-acetaminophen (NORCO) 10-325 MG tablet Take 1 tablet by mouth every 4 (four) hours as  needed. 50 tablet 0  . loperamide (IMODIUM) 2 MG capsule Take 2 mg by mouth daily as needed for diarrhea or loose stools. (Patient not taking: No sig reported)    . metoprolol tartrate (LOPRESSOR) 25 MG tablet Take 12.5 mg by mouth 2 (two) times daily.    . nitroGLYCERIN (NITROSTAT) 0.4 MG SL tablet Place 1 tablet (0.4 mg total) under the tongue every 5 (five) minutes as needed for chest pain (CP or SOB). (Patient not taking: Reported on 03/02/2020) 25 tablet 3  . omeprazole (PRILOSEC) 40 MG capsule Take 1 capsule (40 mg total) by mouth 2 (two) times daily before a meal. (Patient taking differently: Take 40 mg by mouth daily.) 60 capsule 5  . ondansetron (ZOFRAN) 4 MG tablet Take 1 tablet (4 mg total) by mouth every 8 (eight) hours as needed for nausea or vomiting. 4 tablet 0  . potassium chloride (KLOR-CON) 10 MEQ tablet Take 1 tablet (10 mEq total) by mouth daily. 7 tablet 0  . prochlorperazine (COMPAZINE) 10 MG tablet TAKE 1 TABLET(10 MG) BY MOUTH EVERY 6 HOURS AS NEEDED FOR NAUSEA 60 tablet 2  . traMADol (ULTRAM) 50 MG tablet Take 50-100 mg by mouth 4 (four) times daily as needed for moderate pain (pain.).     No current facility-administered medications on file prior to visit.    Allergies:   Allergies  Allergen Reactions  . Duloxetine Other (See Comments)    Caused depression/ patient is taking  . Prednisone Other (See Comments)    Chest pain     Physical Exam  There were no vitals filed for this visit. There is no height or  weight on file to calculate BMI. No exam data present  General: Elderly-appearing Caucasian male, seated, in no evident distress Head: head normocephalic and atraumatic.   Neck: supple with no carotid or supraclavicular bruits Cardiovascular: regular rate and rhythm, no murmurs Musculoskeletal: no deformity; LLE hip flexor weakness due to pain Skin:  no rash/petichiae Vascular:  Normal pulses all extremities   Neurologic Exam Mental Status: Awake and fully alert. Oriented to place and time. Recent and remote memory intact. Attention span, concentration and fund of knowledge appropriate. Mood appropriate and flat affect. Cranial Nerves: Pupils equal, briskly reactive to light. Extraocular movements full without nystagmus. Visual fields full to confrontation. Hearing intact. Facial sensation intact. Face, tongue, palate moves normally and symmetrically.  Motor: Normal bulk and tone. Normal strength in all tested extremity muscles except mild left hip flexor weakness due to ongoing hip pain and lumbar spinal condition Sensory.: intact to touch , pinprick , position and vibratory sensation.  Coordination: Rapid alternating movements normal in all extremities. Finger-to-nose performed accurately bilaterally and heel-to-shin performed accurately on right side and difficulty with left side due to hip pain. Gait and Station: Arises from chair without difficulty. Stance is normal. Gait demonstrates  mild favoring of left leg but is able to ambulate without assistive device.  Mild difficulty with tandem gait Reflexes: 1+ and symmetric. Toes downgoing.        ASSESSMENT: [name] is a 60 y.o. 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 Vascular risk factors include uncontrolled HTN, HLD, CAD, intra-and extracranial stenosis  heavy EtOH use and substance abuse. dx'd malignant neoplasm of supraglottis 01/2019 s/p chemo and XRT    PLAN:  1. Bilateral pontine  infarcts: Residual deficits ***.  Continue aspirin 81 mg daily  and atorvastatin for secondary stroke prevention.  Previously on Plavix but d/c'd 03/2019 d/t prior bleeding from trach site.  Discussed secondary stroke prevention measures and importance of close PCP follow-up for aggressive stroke risk factor management 2. HTN: BP goal<130/90 with avoiding hypotension in setting of intra and extracranial stenosis. 3. HLD: LDL goal<70.  On atorvastatin 40 mg daily per PCP.  Lipid panel *** 4. Intra-and extracranial stenosis: Continue Plavix and atorvastatin. Discussion regarding undergoing imaging as he has not been contacted by vascular surgery but after further review of chart, it was recommended he undergo carotid ultrasound 6 months post hospitalization which would be next month.  Orders initially placed for CTA head/neck but will be canceled and will advise patient that he should be contacted by vascular surgery    Follow up in 6 months or call earlier if needed   Greater than 50% of time during this 25 minute visit was spent on counseling, explanation of diagnosis of bilateral pontine infarcts, reviewing risk factor management of HTN, HLD, intracranial and extracranial stenosis, planning of further management along with potential future management, and discussion with patient answering all questions to [place], AGNP-BC  [facility] [address] [place], [place] [address]  Phone [phone] Fax [phone] Note: This document was prepared with digital dictation and possible smart phrase technology. Any transcriptional errors that result from this process are unintentional.

## 2020-03-23 ENCOUNTER — Ambulatory Visit: Payer: 59 | Admitting: Adult Health

## 2020-03-23 ENCOUNTER — Encounter: Payer: Self-pay | Admitting: Adult Health

## 2020-03-23 VITALS — BP 109/73 | HR 55 | Ht 72.0 in | Wt 126.0 lb

## 2020-03-23 DIAGNOSIS — R42 Dizziness and giddiness: Secondary | ICD-10-CM

## 2020-03-23 DIAGNOSIS — I639 Cerebral infarction, unspecified: Secondary | ICD-10-CM

## 2020-03-23 NOTE — Progress Notes (Signed)
[facility] [address] [place]. [place] [address] [phone]       OFFICE FOLLOW UP NOTE  [name] Date of Birth:  01/07/1960 Medical Record Number:  [id number]   Reason for Referral: Pontine stroke follow up    CHIEF COMPLAINT:  Chief Complaint  Patient presents with  . Follow-up    Tx rm, alone, states his walking has improved, using cane, c/o some dizziness     HPI:   Today, 03/22/2020, [name] returns for prolonged 24-month stroke follow-up.  Stable from stroke standpoint since prior visit without new stroke/TIA symptoms Ambulates with cane for more long distance and "just in case" but does not usually need it and denies any recent falls. He will occasionally get dizzy which has been present over the past month which is when he was diagnosed with COVID. Typically occurs when he stands up from seated position - subsides after a couple seconds.   Compliant on aspirin 81 mg daily and atorvastatin 40 mg daily -denies side effects Blood pressure today 109/73 - monitors at home and typically 110-117/80s Reports recent lab work by PCP which was satisfactory  Carotid ultrasound 08/2019 B ICA 1-39% stenosis  He has been routinely following with oncology for supraglottic squamous cell carcinoma Previously having difficulty with appetite but this has since been improving. Noted almost 30 pound weight loss since prior visit 9 months ago  No further concerns at this time     History provided for reference purposes only Update 06/24/2019 JM: [name] returns for follow-up regarding bilateral pontine stroke in 07/2018.  He has been doing well from a stroke standpoint since prior visit without new or worsening stroke/TIA symptoms. He report occasional visual and gait impairment but this has been stable without worsening.  He currently transfers via wheelchair for long distance due to multiple hospitalizations and recently completing chemoradiation  (additional information below).  Remains on aspirin 81 mg daily and atorvastatin 80 mg daily for secondary stroke prevention.  Blood pressure today 115/81.  He did follow-up with IR Dr. Estanislado Pandy with carotid ultrasound 02/2019 which showed right ICA 40 to 59% stenosis and left ICA 1 to 39% stenosis and bilateral VAs demonstrate antegrade flow and recommended follow-up in 6 months.  No neurological or stroke related concerns at this time.  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 He recently completed chemo last month and continues to follow with oncology regularly.   Update 12/24/2018 JM: [name] is a 60 year old male who is being seen today for stroke follow-up.  Residual deficits of occasional balance difficulties and occasional visual impairment.  He has since completed PT with improvement of balance.  He does endorse ongoing left hip pain with recommendation of surgery but patient declined as there is a chance he can be paralyzed.  He recently obtained with glasses which have helped stabilize vision.  He has completed recommended 73-month DAPT and continues on Plavix alone with mild bruising but no bleeding.  Continues on atorvastatin 80 mg daily without myalgias.  Blood pressure today 153/97.  Vascular surgery recommended repeat carotid ultrasound 6  months post discharge.  Denies new or worsening stroke/TIA symptoms.  Initial visit 09/10/2018: [name]  is being seen today for hospital follow-up accompanied by his wife.  Residual deficits of mild blurring of vision, dizziness, balance difficulties and weakness in both legs.  He does endorse overall improvement but does continue to have difficulties with daily functioning.  Therapy was not recommended at discharge.  He has not returned to work as he was previously working as an Clinical biochemist and currently in the process of applying for [place] disability due to difficulties with ambulation and intolerance to heat.  He continues on aspirin 325 mg daily and clopidogrel 75 mg daily without bleeding or bruising.  Continues on atorvastatin 80 mg daily without myalgias.  Blood pressure today 166/94.  He does monitor at home and typically around 140s. He is questioning undergoing direct laryngoscopy with biopsy of laryngeal mass which is currently scheduled on 10/01/2018.  Per patient report, ongoing lymph node issues for the past 6-7 months with undergoing laryngoscopy for further evaluation.  Ongoing alcohol use approximately 4-6 beers daily and occasional THC use which wife reports he uses for self-medicating for anxiety or stress. Denies new or worsening stroke/TIA symptoms.  Stroke admission 07/31/2018: [name] is a 60 y.o. male with history of chronic LBP, HTN, HLD, CAD presented to [facility] ED on 07/31/2018 with blurred vision, ataxia, incoordination, and weakness in legs.  Transferred to [facility] for further evaluation/management.  MRI showed R > L pontine infarcts.  MRA showed occlusion bilateral VA's.  CTA head/neck showed bilateral V4 stenosis with occlusion distal VA and BA distally, right ICA 70% and left ICA 65% narrowing, distal left ICA 60% at cervical petrous junction, and severe ICA atherosclerosis, left CCA 40 to 50% stenosis, right VA 50% stenosis, bilateral V2 60 to 70% stenosis and left subclavian 65% stenosis.  Underwent cerebral angiogram by Dr. Estanislado Pandy which showed bilateral  vertebral artery occlusion distal to PICA's and proximal right ICA 65% stenosis.  No further interventions recommended at that time and recommended follow-up with vascular surgery outpatient.  2D echo normal EF with mild LVH and diastolic dysfunction with trivial aortic regurgitation.  LDL 101 and A1c 5.4.  UDS positive for opiates, benzos and THC.  Recommended DAPT for 3 months then Plavix alone due to intracranial stenosis.  BP as high as 244/114 during admission and recommended long-term BP goal 1 30-1 60 given severe vascular stenosis.  Recommended continuation of atorvastatin 80 mg daily.  Heavy EtOH use and substance use with cessation counseling provided.  Other stroke risk factors include former tobacco use, family history of stroke and CAD s/p DES 2014.  He was discharged home in stable condition without therapy needs as he returned to baseline.    ROS:   All 14 systems reviewed with patient concerns of those listed in HPI and all other negative     PMH:  Past Medical History:  Diagnosis Date  . Anemia   . Anxiety   . Arthritis    back  . Bradycardia    a. During 11/2012 adm: lopressor decreased.  . Cancer of larynx ([place])   . Carotid artery stenosis   . 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 Coronary artery disease    a. Diag cath 10/2012 for CP/abnormal nuc -> planned PCI s/p LAD atherectomy/DES placement 11/05/12.  . Depression   .  Dilated aortic root ([place])   . GERD (gastroesophageal reflux disease)   . History of hiatal hernia   . Hypercholesteremia   . Hypertension   . PEG tube malfunction ([place])   . Pneumonia   . Stroke ([place]) 07/31/2018   bilateral vertebrobasilar occlusion; "left side weaker thanit was before."    PSH:  Past Surgical History:  Procedure Laterality Date  . BIOPSY  11/24/2019   Procedure: BIOPSY;  Surgeon: Rush Landmark Telford Nab., MD;  Location: [facility];  Service:  Gastroenterology;;  . CARDIAC CATHETERIZATION  10/29/2012  . COLONOSCOPY WITH PROPOFOL N/A 11/24/2019   Procedure: COLONOSCOPY WITH PROPOFOL;  Surgeon: Rush Landmark Telford Nab., MD;  Location: [facility];  Service: Gastroenterology;  Laterality: N/A;  . CORONARY ANGIOPLASTY WITH STENT PLACEMENT  11/05/2012  . DIRECT LARYNGOSCOPY N/A 01/02/2019   Procedure: MICRO DIRECT LARYNGOSCOPY BIOPSY OF LARYNGEAL MASS;  Surgeon: [name], MD;  Location: [facility];  Service: ENT;  Laterality: N/A;  . ESOPHAGOGASTRODUODENOSCOPY (EGD) WITH PROPOFOL N/A 08/11/2019   Procedure: ATTEMPTED ESOPHAGOGASTRODUODENOSCOPY (EGD) WITH PROPOFOL (UNABLE TO PERFORM PERCUTANEOUS ENDOSCOPIC GASTROSTOMY TUBE REPLACEMENT);  Surgeon: [name], MD;  Location: [facility];  Service: Gastroenterology;  Laterality: N/A;  . ESOPHAGOGASTRODUODENOSCOPY (EGD) WITH PROPOFOL N/A 11/24/2019   Procedure: ESOPHAGOGASTRODUODENOSCOPY (EGD) WITH PROPOFOL;  Surgeon: Rush Landmark Telford Nab., MD;  Location: [place];  Service: Gastroenterology;  Laterality: N/A;  . ESOPHAGOGASTRODUODENOSCOPY (EGD) WITH PROPOFOL N/A 01/26/2020   Procedure: ESOPHAGOGASTRODUODENOSCOPY (EGD) WITH PROPOFOL;  Surgeon: Rush Landmark Telford Nab., MD;  Location: [facility];  Service: Gastroenterology;  Laterality: N/A;  . HEMORRHOID SURGERY  ~ 2010  . INSERTION OF MESH N/A 06/02/2015   Procedure: INSERTION OF MESH;  Surgeon: [name], MD;  Location: [facility];  Service: General;  Laterality: N/A;  . IR ANGIO INTRA EXTRACRAN SEL COM CAROTID INNOMINATE BILAT MOD SED  08/02/2018  . IR ANGIO VERTEBRAL SEL VERTEBRAL BILAT MOD SED  08/02/2018  . IR REPLACE G-TUBE SIMPLE WO FLUORO  08/12/2019  . IR REPLC GASTRO/COLONIC TUBE PERCUT W/FLUORO  08/13/2019  . LAPAROSCOPIC INSERTION GASTROSTOMY TUBE N/A 02/24/2019   Procedure: LAPAROSCOPIC INSERTION GASTROSTOMY TUBE;  Surgeon: [name], MD;  Location: [facility];  Service: General;  Laterality: N/A;  . LEFT HEART CATHETERIZATION WITH CORONARY  ANGIOGRAM N/A 10/30/2012   Procedure: LEFT HEART CATHETERIZATION WITH CORONARY ANGIOGRAM;  Surgeon: [name], MD;  Location: [facility];  Service: Cardiovascular;  Laterality: N/A;  . MULTIPLE EXTRACTIONS WITH ALVEOLOPLASTY N/A 02/18/2019   Procedure: Extraction of tooth #'s 5-7, 10,11,14,20-29, 31 and 32 with alveoloplasty and maxiillary left buccal exostoses reductions;  Surgeon: [name], [name];  Location: [facility];  Service: Oral Surgery;  Laterality: N/A;  . PEG PLACEMENT N/A 01/26/2020   Procedure: PERCUTANEOUS ENDOSCOPIC GASTROSTOMY (PEG) REPLACEMENT;  Surgeon: Irving Copas., MD;  Location: [facility];  Service: Gastroenterology;  Laterality: N/A;  . PERCUTANEOUS CORONARY ROTOBLATOR INTERVENTION (PCI-R) N/A 11/05/2012   Procedure: PERCUTANEOUS CORONARY ROTOBLATOR INTERVENTION (PCI-R);  Surgeon: [name], MD;  Location: [facility] CATH LAB;  Service: Cardiovascular;  Laterality: N/A;  . SAVORY DILATION N/A 11/24/2019   Procedure: SAVORY DILATION;  Surgeon: Irving Copas., MD;  Location: [facility];  Service: Gastroenterology;  Laterality: N/A;  . SAVORY DILATION N/A 01/26/2020   Procedure: SAVORY DILATION;  Surgeon: Irving Copas., MD;  Location: [facility];  Service: Gastroenterology;  Laterality: N/A;  . TRACHEOSTOMY TUBE PLACEMENT N/A 02/18/2019   Procedure: Tracheostomy;  Surgeon: [name], MD;  Location: [facility];  Service: ENT;  Laterality: N/A;  . UMBILICAL HERNIA REPAIR N/A 06/02/2015   Procedure: UMBILICAL HERNIORRHAPHY WITH MESH;  Surgeon: [name], MD;  Location: [facility];  Service: General;  Laterality: N/A;    Social History:  Social History   Socioeconomic History  . Marital status: Married    Spouse name: [name]  . Number of children: 0  . Years of education: Not on file  . Highest education level: Not on file  Occupational History  . 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 Smoking status: Former Smoker    Packs/day: 3.00    Years: 32.00    Pack years: 96.00    Types: Cigarettes    Start date: 08/29/1977    Quit date: 11/29/2006    Years since quitting: 13.3  . Smokeless tobacco: Former Systems developer    Types: Chew    Quit date: 2003  . Tobacco comment: 11/05/2012 "chewed tobacco when I play ball; haven't chewed since age 59"  Vaping Use  . Vaping Use: Never used  Substance and Sexual Activity  . Alcohol use: Yes    Alcohol/week: 12.0 standard drinks    Types: 12 Cans of beer per week    Comment: per wife [name] 6-7/week now as of 02/24/19  . Drug use: Not Currently    Types: Marijuana    Comment: Last use was on - denies on 04/08/19  . Sexual activity: Not Currently  Other Topics Concern  . Not on file  Social History Narrative   Patient is disabled.  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  Patient previously 3 pack/day use.   Patient currently drinking 3-4 beers per day from 12 pack a day.   Patient denies use of chew or other illicit drugs.   Social Determinants of Health   Financial Resource Strain: Not on file  Food Insecurity: No Food Insecurity  . Worried About Charity fundraiser in the Last Year: Never true  . Ran Out of Food in the Last Year: Never true  Transportation Needs: Not on file  Physical Activity: Not on file  Stress: Not on file  Social Connections: Not on file  Intimate Partner Violence: Not on file    Family History:  Family History  Problem Relation Age of Onset  . Hypertension Mother   . Heart disease Father   . Hypertension Father   . Heart disease Sister   . Hypertension Maternal Grandmother   . Hypertension Maternal Grandfather   . Hypertension Paternal Grandmother   . Hypertension Paternal Grandfather   . Pancreatic cancer Maternal Uncle   . Breast cancer Maternal Aunt   . Breast cancer  Maternal Aunt   . Diabetes Paternal Uncle   . Diabetes Paternal Aunt     Medications:   Current Outpatient Medications on File Prior to Visit  Medication Sig Dispense Refill  . acetaminophen (TYLENOL) 325 MG tablet Take 2 tablets (650 mg total) by mouth every 6 (six) hours as needed for mild pain, fever or headache (or Fever >/= 101). 12 tablet 0  . amLODipine (NORVASC) 5 MG tablet Take 5 mg by mouth daily.    Marland Kitchen aspirin EC 81 MG tablet Take 81 mg by mouth daily.     Marland Kitchen atorvastatin (LIPITOR) 40 MG tablet Take 80 mg by mouth at bedtime.    Marland Kitchen  diazepam (VALIUM) 10 MG tablet Place 10 mg into feeding tube 3 (three) times daily as needed for anxiety or sleep.     . diphenoxylate-atropine (LOMOTIL) 2.5-0.025 MG tablet Take 2 tablets by mouth in the morning, at noon, and at bedtime. 90 tablet 0  . doxycycline (VIBRAMYCIN) 100 MG capsule Take 1 capsule (100 mg total) by mouth 2 (two) times daily. 20 capsule 0  . dronabinol (MARINOL) 5 MG capsule Take 1 capsule (5 mg total) by mouth 2 (two) times daily before a meal. 60 capsule 2  . famotidine (PEPCID) 20 MG tablet Take 20 mg by mouth at bedtime.    Marland Kitchen HYDROcodone-acetaminophen (NORCO) 10-325 MG tablet Take 1 tablet by mouth every 4 (four) hours as needed. 50 tablet 0  . loperamide (IMODIUM) 2 MG capsule Take 2 mg by mouth daily as needed for diarrhea or loose stools.    . metoprolol tartrate (LOPRESSOR) 25 MG tablet Take 12.5 mg by mouth 2 (two) times daily.    . nitroGLYCERIN (NITROSTAT) 0.4 MG SL tablet Place 1 tablet (0.4 mg total) under the tongue every 5 (five) minutes as needed for chest pain (CP or SOB). 25 tablet 3  . omeprazole (PRILOSEC) 40 MG capsule Take 1 capsule (40 mg total) by mouth 2 (two) times daily before a meal. (Patient taking differently: Take 40 mg by mouth daily.) 60 capsule 5  . ondansetron (ZOFRAN) 4 MG tablet Take 1 tablet (4 mg total) by mouth every 8 (eight) hours as needed for nausea or vomiting. 4 tablet 0  . potassium  chloride (KLOR-CON) 10 MEQ tablet Take 1 tablet (10 mEq total) by mouth daily. 7 tablet 0  . prochlorperazine (COMPAZINE) 10 MG tablet TAKE 1 TABLET(10 MG) BY MOUTH EVERY 6 HOURS AS NEEDED FOR NAUSEA 60 tablet 2  . traMADol (ULTRAM) 50 MG tablet Take 50-100 mg by mouth 4 (four) times daily as needed for moderate pain (pain.).     No current facility-administered medications on file prior to visit.    Allergies:   Allergies  Allergen Reactions  . Duloxetine Other (See Comments)    Caused depression/ patient is taking  . Prednisone Other (See Comments)    Chest pain     Physical Exam  Vitals:   03/23/20 1351  BP: 109/73  Pulse: (!) 55  Weight: 126 lb (57.2 kg)  Height: 6' (1.829 m)   Body mass index is 17.09 kg/m. No exam data present  General: Elderly-appearing frail Caucasian male, seated, in no evident distress Head: head normocephalic and atraumatic.   Neck: supple with no carotid or supraclavicular bruits Cardiovascular: regular rate and rhythm, no murmurs Musculoskeletal: no deformity Skin:  no rash/petichiae Vascular:  Normal pulses all extremities   Neurologic Exam Mental Status: Awake and fully alert.   Fluent speech and language.  Oriented to place and time. Recent and remote memory intact. Attention span, concentration and fund of knowledge appropriate. Mood appropriate and flat affect. Cranial Nerves: Pupils equal, briskly reactive to light. Extraocular movements full without nystagmus. Visual fields full to confrontation. Hearing intact. Facial sensation intact. Face, tongue, palate moves normally and symmetrically.  Motor: Normal bulk and tone. Normal strength in all tested extremity muscles Sensory.: intact to touch , pinprick , position and vibratory sensation.  Coordination: Rapid alternating movements normal in all extremities. Finger-to-nose and heel-to-shin performed accurately bilaterally Gait and Station: Arises from chair without difficulty. Stance is  normal. Gait demonstrates  mild favoring of left leg and mild imbalance  with use of cane.  Mild difficulty with tandem gait Reflexes: 1+ and symmetric. Toes downgoing.        ASSESSMENT: [name] is a 60 y.o. 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 Vascular risk factors include uncontrolled HTN, HLD, CAD, intra-and extracranial stenosis heavy EtOH use and substance abuse. dx'd malignant neoplasm of supraglottis 01/2019 s/p chemo and XRT    PLAN:  1. Bilateral pontine infarcts: Relatively stable.  Continue aspirin 81 mg daily  and atorvastatin for secondary stroke prevention.  Previously on Plavix but d/c'd 03/2019 d/t prior bleeding from trach site.  Discussed secondary stroke prevention measures and importance of close PCP follow-up for aggressive stroke risk factor management 2. HTN: BP goal<130/90.  Discussed importance of avoiding hypotension with low blood pressure reading today but upon review of prior visit notes with oncology, blood pressure has been running on the lower side.  Advised to continue to monitor this at home and to follow-up with PCP for possible need of medication adjustments 3. Dizziness: Likely postural hypotension as typically present upon standing lasting for short duration.  Does not appear to be neurologically related.  Advised to follow-up with PCP as well as ensuring adequate hydration, nutrition and standing slowly from seated positions 4. HLD: LDL goal<70.  On atorvastatin 40 mg daily per PCP. Per pt, lipid panel obtained by PCP which was satisfactory 5. Intra-and extracranial stenosis: Continue Plavix and atorvastatin.  Discussed importance of avoiding hypotension to ensure adequate perfusion.  Recent carotid ultrasound bilateral 1 to 39% stenosis.  Followed by Dr.  Estanislado Pandy    Follow up in 1 year or call earlier if needed   CC:  [place] provider: Dr. Jomarie Longs, MD    I spent 30 minutes of face-to-face and non-face-to-face time with patient.  This included previsit chart review, lab review, study review, order entry, electronic health record documentation, patient education and discussion regarding history of prior strokes, importance of managing stroke risk factors, dizziness and likely etiologies and answered all other questions to patient satisfaction    Frann Rider, [facility]  [facility] [address] [place], [place] [address]  Phone [phone] Fax [phone] Note: This document was prepared with digital dictation and possible smart phrase technology. Any transcriptional errors that result from this process are unintentional.

## 2020-03-23 NOTE — Patient Instructions (Addendum)
Contin2ue aspirin 81 mg daily  and atorvastatin  for secondary stroke prevention  Continue to follow up with PCP regarding cholesterol and blood pressure management  Maintain strict control of hypertension with blood pressure goal below 130/90 and cholesterol with LDL cholesterol (bad cholesterol) goal below 70 mg/dL.     Followup in the future with me in 1 year or call earlier if needed      Thank you for coming to see [place] at [facility]. I hope we have been able to provide you high quality care today.  You may receive a patient satisfaction survey over the next few weeks. We would appreciate your feedback and comments so that we may continue to improve ourselves and the health of our patients.     Dizziness Dizziness is a common problem. It is a feeling of unsteadiness or light-headedness. You may feel like you are about to faint. Dizziness can lead to injury if you stumble or fall. Anyone can become dizzy, but dizziness is more common in older adults. This condition can be caused by a number of things, including medicines, dehydration, or illness. Follow these instructions at home: Eating and drinking  Drink enough fluid to keep your urine clear or pale yellow. This helps to keep you from becoming dehydrated. Try to drink more clear fluids, such as water.  Do not drink alcohol.  Limit your caffeine intake if told to do so by your health care provider. Check ingredients and nutrition facts to see if a food or beverage contains caffeine.  Limit your salt (sodium) intake if told to do so by your health care provider. Check ingredients and nutrition facts to see if a food or beverage contains sodium. Activity  Avoid making quick movements. ? Rise slowly from chairs and steady yourself until you feel okay. ? In the morning, first sit up on the side of the bed. When you feel okay, stand slowly while you hold onto something until you know that your balance is  fine.  If you need to stand in one place for a long time, move your legs often. Tighten and relax the muscles in your legs while you are standing.  Do not drive or use heavy machinery if you feel dizzy.  Avoid bending down if you feel dizzy. Place items in your home so that they are easy for you to reach without leaning over. Lifestyle  Do not use any products that contain nicotine or tobacco, such as cigarettes and e-cigarettes. If you need help quitting, ask your health care provider.  Try to reduce your stress level by using methods such as yoga or meditation. Talk with your health care provider if you need help to manage your stress. General instructions  Watch your dizziness for any changes.  Take over-the-counter and prescription medicines only as told by your health care provider. Talk with your health care provider if you think that your dizziness is caused by a medicine that you are taking.  Tell a friend or a family member that you are feeling dizzy. If he or she notices any changes in your behavior, have this person call your health care provider.  Keep all follow-up visits as told by your health care provider. This is important. Contact a health care provider if:  Your dizziness does not go away.  Your dizziness or light-headedness gets worse.  You feel nauseous.  You have reduced hearing.  You have new symptoms.  You are unsteady on your feet or  you feel like the room is spinning. Get help right away if:  You vomit or have diarrhea and are unable to eat or drink anything.  You have problems talking, walking, swallowing, or using your arms, hands, or legs.  You feel generally weak.  You are not thinking clearly or you have trouble forming sentences. It may take a friend or family member to notice this.  You have chest pain, abdominal pain, shortness of breath, or sweating.  Your vision changes.  You have any bleeding.  You have a severe headache.  You  have neck pain or a stiff neck.  You have a fever. These symptoms may represent a serious problem that is an emergency. Do not wait to see if the symptoms will go away. Get medical help right away. Call your local emergency services (911 in the [place] Do not drive yourself to the hospital. Summary  Dizziness is a feeling of unsteadiness or light-headedness. This condition can be caused by a number of things, including medicines, dehydration, or illness.  Anyone can become dizzy, but dizziness is more common in older adults.  Drink enough fluid to keep your urine clear or pale yellow. Do not drink alcohol.  Avoid making quick movements if you feel dizzy. Monitor your dizziness for any changes. This information is not intended to replace advice given to you by your health care provider. Make sure you discuss any questions you have with your health care provider. Document Revised: 12/22/2016 Document Reviewed: 01/22/2016 Elsevier Patient Education  2021 [employer].

## 2020-03-23 NOTE — Progress Notes (Signed)
I agree with the above plan 

## 2020-03-25 ENCOUNTER — Other Ambulatory Visit (HOSPITAL_COMMUNITY): Payer: 59

## 2020-03-26 NOTE — Pre-Procedure Instructions (Signed)
Called and spoke with patient and reports that patient has already received additional testing and screening and no longer needs flexible sigmoidoscopy. Reports trying to call office multiple times and can never reach anyone to have this cancelled. Verbalized that he will still be here for upper endoscopy and instructions provided to patient and family

## 2020-03-28 NOTE — Anesthesia Preprocedure Evaluation (Addendum)
Anesthesia Evaluation  Patient identified by MRN, date of birth, ID band Patient awake    Reviewed: Allergy & Precautions, NPO status , Patient's Chart, lab work & pertinent test results  Airway Mallampati: II  TM Distance: >3 FB Neck ROM: Full    Dental no notable dental hx. (+) Edentulous Upper, Edentulous Lower   Pulmonary former smoker,  COPD, former smoker,  Quit smoking 2008, 96 pack year history  Laryngeal CA  Asymmetric soft tissue mass lesion within the left aryepiglottic fold extending to the supraglottic larynx measures at least 1.7 x 1.2 x 0.7 cm.   S/p tracheostomy on 02/18/19 due to difficult airway now decannulated  Pulmonary exam normal breath sounds clear to auscultation   Pulmonary exam normal breath sounds clear to auscultation       Cardiovascular hypertension, Pt. on medications and Pt. on home beta blockers + CAD, + Cardiac Stents and +CHF  Normal cardiovascular exam Rhythm:Regular Rate:Normal  10/24/19 Echo 1. Left ventricular ejection fraction, by estimation, is 60 to 65%. The  left ventricle has normal function. The left ventricle has no regional  wall motion abnormalities. There is mild left ventricular hypertrophy.  Left ventricular diastolic parameters  are consistent with Grade I diastolic dysfunction (impaired relaxation).  2. Right ventricular systolic function is normal. The right ventricular  size is normal.    Neuro/Psych Anxiety Residual L sided weakness CVA, Residual Symptoms    GI/Hepatic GERD  ,(+)     substance abuse  alcohol use and marijuana use,   Endo/Other    Renal/GU      Musculoskeletal  (+) Arthritis , Osteoarthritis,    Abdominal   Peds  Hematology   Anesthesia Other Findings All Duloxetine, Prednisone  Reproductive/Obstetrics                            Anesthesia Physical Anesthesia Plan  ASA: IV  Anesthesia Plan: MAC    Post-op Pain Management:    Induction: Intravenous  PONV Risk Score and Plan: Treatment may vary due to age or medical condition  Airway Management Planned: Natural Airway  Additional Equipment: None  Intra-op Plan:   Post-operative Plan:   Informed Consent: I have reviewed the patients History and Physical, chart, labs and discussed the procedure including the risks, benefits and alternatives for the proposed anesthesia with the patient or authorized representative who has indicated his/her understanding and acceptance.     Dental advisory given  Plan Discussed with: CRNA and Anesthesiologist  Anesthesia Plan Comments:        Anesthesia Quick Evaluation

## 2020-03-29 ENCOUNTER — Encounter (HOSPITAL_COMMUNITY)
Admission: RE | Disposition: A | Discharge status: Home / Self Care | Payer: Self-pay | Source: Home / Self Care | Attending: Gastroenterology

## 2020-03-29 ENCOUNTER — Ambulatory Visit (HOSPITAL_COMMUNITY): Payer: 59 | Admitting: Certified Registered Nurse Anesthetist

## 2020-03-29 ENCOUNTER — Ambulatory Visit (HOSPITAL_COMMUNITY)
Admission: RE | Admit: 2020-03-29 | Discharge: 2020-03-29 | Disposition: A | Discharge status: Home / Self Care | Payer: 59 | Attending: Gastroenterology | Admitting: Gastroenterology

## 2020-03-29 ENCOUNTER — Other Ambulatory Visit: Payer: Self-pay

## 2020-03-29 ENCOUNTER — Encounter (HOSPITAL_COMMUNITY): Payer: Self-pay | Admitting: Gastroenterology

## 2020-03-29 DIAGNOSIS — Z800 Family history of malignant neoplasm of digestive organs: Secondary | ICD-10-CM | POA: Diagnosis not present

## 2020-03-29 DIAGNOSIS — K2289 Other specified disease of esophagus: Secondary | ICD-10-CM | POA: Diagnosis not present

## 2020-03-29 DIAGNOSIS — Z803 Family history of malignant neoplasm of breast: Secondary | ICD-10-CM | POA: Diagnosis not present

## 2020-03-29 DIAGNOSIS — K2950 Unspecified chronic gastritis without bleeding: Secondary | ICD-10-CM | POA: Insufficient documentation

## 2020-03-29 DIAGNOSIS — I10 Essential (primary) hypertension: Secondary | ICD-10-CM | POA: Insufficient documentation

## 2020-03-29 DIAGNOSIS — Z888 Allergy status to other drugs, medicaments and biological substances status: Secondary | ICD-10-CM | POA: Insufficient documentation

## 2020-03-29 DIAGNOSIS — K449 Diaphragmatic hernia without obstruction or gangrene: Secondary | ICD-10-CM | POA: Diagnosis not present

## 2020-03-29 DIAGNOSIS — K2100 Gastro-esophageal reflux disease with esophagitis, without bleeding: Secondary | ICD-10-CM | POA: Insufficient documentation

## 2020-03-29 DIAGNOSIS — Z8673 Personal history of transient ischemic attack (TIA), and cerebral infarction without residual deficits: Secondary | ICD-10-CM | POA: Insufficient documentation

## 2020-03-29 DIAGNOSIS — Z8521 Personal history of malignant neoplasm of larynx: Secondary | ICD-10-CM | POA: Insufficient documentation

## 2020-03-29 DIAGNOSIS — R1310 Dysphagia, unspecified: Secondary | ICD-10-CM | POA: Diagnosis not present

## 2020-03-29 DIAGNOSIS — Z8249 Family history of ischemic heart disease and other diseases of the circulatory system: Secondary | ICD-10-CM | POA: Diagnosis not present

## 2020-03-29 DIAGNOSIS — Z87891 Personal history of nicotine dependence: Secondary | ICD-10-CM | POA: Insufficient documentation

## 2020-03-29 DIAGNOSIS — K3189 Other diseases of stomach and duodenum: Secondary | ICD-10-CM

## 2020-03-29 DIAGNOSIS — Z9221 Personal history of antineoplastic chemotherapy: Secondary | ICD-10-CM | POA: Insufficient documentation

## 2020-03-29 DIAGNOSIS — Z955 Presence of coronary angioplasty implant and graft: Secondary | ICD-10-CM | POA: Insufficient documentation

## 2020-03-29 DIAGNOSIS — K222 Esophageal obstruction: Secondary | ICD-10-CM

## 2020-03-29 DIAGNOSIS — Z833 Family history of diabetes mellitus: Secondary | ICD-10-CM | POA: Insufficient documentation

## 2020-03-29 HISTORY — PX: BIOPSY: SHX5522

## 2020-03-29 HISTORY — PX: ESOPHAGOGASTRODUODENOSCOPY (EGD) WITH PROPOFOL: SHX5813

## 2020-03-29 SURGERY — ESOPHAGOGASTRODUODENOSCOPY (EGD) WITH PROPOFOL
Anesthesia: Monitor Anesthesia Care

## 2020-03-29 MED ORDER — OMEPRAZOLE 40 MG PO CPDR: 40.0000 mg | DELAYED_RELEASE_CAPSULE | Freq: Two times a day (BID) | ORAL | 5 refills | Status: DC

## 2020-03-29 MED ORDER — PROPOFOL 1000 MG/100ML IV EMUL
INTRAVENOUS | Status: AC
  Filled 2020-03-29: qty 100

## 2020-03-29 MED ORDER — PROPOFOL 500 MG/50ML IV EMUL
INTRAVENOUS | Status: DC | PRN
  Administered 2020-03-29: 125 ug/kg/min via INTRAVENOUS

## 2020-03-29 MED ORDER — LACTATED RINGERS IV SOLN
INTRAVENOUS | Status: DC
  Administered 2020-03-29: 1000 mL via INTRAVENOUS

## 2020-03-29 MED ORDER — PROPOFOL 10 MG/ML IV BOLUS
INTRAVENOUS | Status: DC | PRN
  Administered 2020-03-29: 30 mg via INTRAVENOUS

## 2020-03-29 MED ORDER — LIDOCAINE 2% (20 MG/ML) 5 ML SYRINGE
INTRAMUSCULAR | Status: DC | PRN
  Administered 2020-03-29 (×2): 50 mg via INTRAVENOUS

## 2020-03-29 MED ORDER — SODIUM CHLORIDE 0.9 % IV SOLN: INTRAVENOUS | Status: DC

## 2020-03-29 SURGICAL SUPPLY — 14 items

## 2020-03-29 NOTE — Discharge Instructions (Signed)
YOU HAD AN ENDOSCOPIC PROCEDURE TODAY: Refer to the procedure report and other information in the discharge instructions given to you for any specific questions about what was found during the examination. If this information does not answer your questions, please call [place] office at [phone] to clarify.   YOU SHOULD EXPECT: Some feelings of bloating in the abdomen. Passage of more gas than usual. Walking can help get rid of the air that was put into your GI tract during the procedure and reduce the bloating. If you had a lower endoscopy (such as a colonoscopy or flexible sigmoidoscopy) you may notice spotting of blood in your stool or on the toilet paper. Some abdominal soreness may be present for a day or two, also.  DIET: Your first meal following the procedure should be a light meal and then it is ok to progress to your normal diet. A half-sandwich or bowl of soup is an example of a good first meal. Heavy or fried foods are harder to digest and may make you feel nauseous or bloated. Drink plenty of fluids but you should avoid alcoholic beverages for 24 hours. If you had a esophageal dilation, please see attached instructions for diet.    ACTIVITY: Your care partner should take you home directly after the procedure. You should plan to take it easy, moving slowly for the rest of the day. You can resume normal activity the day after the procedure however YOU SHOULD NOT DRIVE, use power tools, machinery or perform tasks that involve climbing or major physical exertion for 24 hours (because of the sedation medicines used during the test).   SYMPTOMS TO REPORT IMMEDIATELY: A gastroenterologist can be reached at any hour. Please call [phone]  for any of the following symptoms:   Following upper endoscopy (EGD, EUS, ERCP, esophageal dilation) Vomiting of blood or coffee ground material  New, significant abdominal pain  New, significant chest pain or pain under the shoulder blades  Painful or  persistently difficult swallowing  New shortness of breath  Black, tarry-looking or red, bloody stools  FOLLOW UP:  If any biopsies were taken you will be contacted by phone or by letter within the next 1-3 weeks. Call [phone]  if you have not heard about the biopsies in 3 weeks.  Please also call with any specific questions about appointments or follow up tests.  

## 2020-03-29 NOTE — Transfer of Care (Signed)
Immediate Anesthesia Transfer of Care Note  Patient: SUHAIL PELOQUIN  Procedure(s) Performed: ESOPHAGOGASTRODUODENOSCOPY (EGD) WITH PROPOFOL (N/A ) BIOPSY ESOPHAGEAL DILITATION (N/A )  Patient Location: Endoscopy Unit  Anesthesia Type:MAC  Level of Consciousness: awake, alert  and oriented  Airway & Oxygen Therapy: Patient Spontanous Breathing and Patient connected to face mask oxygen  Post-op Assessment: Report given to RN and Post -op Vital signs reviewed and stable  Post vital signs: Reviewed and stable  Last Vitals:  Vitals Value Taken Time  BP 112/74   Temp    Pulse 58 03/29/20 0946  Resp 12 03/29/20 0946  SpO2 100 % 03/29/20 0946  Vitals shown include unvalidated device data.  Last Pain:  Vitals:   03/29/20 0750  TempSrc: Oral  PainSc: 0-No pain         Complications: No complications documented.

## 2020-03-29 NOTE — Anesthesia Postprocedure Evaluation (Signed)
Anesthesia Post Note  Patient: [name]  Procedure(s) Performed: ESOPHAGOGASTRODUODENOSCOPY (EGD) WITH PROPOFOL (N/A ) BIOPSY ESOPHAGEAL DILITATION (N/A )     Patient location during evaluation: Endoscopy Anesthesia Type: MAC Level of consciousness: awake and alert Pain management: pain level controlled Vital Signs Assessment: post-procedure vital signs reviewed and stable Respiratory status: spontaneous breathing, nonlabored ventilation, respiratory function stable and patient connected to nasal cannula oxygen Cardiovascular status: blood pressure returned to baseline and stable Postop Assessment: no apparent nausea or vomiting Anesthetic complications: no   No complications documented.  Last Vitals:  Vitals:   03/29/20 0955 03/29/20 1005  BP: 134/84 (!) 147/85  Pulse:    Resp:    Temp:    SpO2:      Last Pain:  Vitals:   03/29/20 1005  TempSrc:   PainSc: 0-No pain                 Barnet Glasgow

## 2020-03-29 NOTE — Op Note (Signed)
[facility] Patient Name: [name] Procedure Date: 03/29/2020 MRN: [id number] Attending MD: Justice Britain , MD Date of Birth: 1960/06/28 CSN: [id number] Age: 60 Admit Type: Outpatient Procedure:                Upper GI endoscopy Indications:              Dysphagia, Stricture of the esophagus, For therapy                            of esophageal stricture (previous Chemo-XRT for                            Laryngeal cancer) Providers:                Justice Britain, MD, Jeanella Cara, RN,                            Josie Dixon, RN, Laverda Sorenson, Technician Referring MD:             [name], MD, [name], MD Medicines:                Monitored Anesthesia Care Complications:            No immediate complications. Estimated Blood Loss:     Estimated blood loss was minimal. Procedure:                Pre-Anesthesia Assessment:                           - Prior to the procedure, a History and Physical                            was performed, and patient medications and                            allergies were reviewed. The patient's tolerance of                            previous anesthesia was also reviewed. The risks                            and benefits of the procedure and the sedation                            options and risks were discussed with the patient.                            All questions were answered, and informed consent                            was obtained. Prior Anticoagulants: The patient has                            taken no previous anticoagulant or antiplatelet  agents except for aspirin. ASA Grade Assessment:                            III - A patient with severe systemic disease. After                            reviewing the risks and benefits, the patient was                            deemed in satisfactory condition to undergo the                             procedure.                           After obtaining informed consent, the endoscope was                            passed under direct vision. Throughout the                            procedure, the patient's blood pressure, pulse, and                            oxygen saturations were monitored continuously. The                            [id number]-[id number] ([id number]) Olympus ultra slim endoscope                            was introduced through the mouth, and advanced to                            the second part of duodenum. The upper GI endoscopy                            was accomplished without difficulty. The patient                            tolerated the procedure. Scope In: Scope Out: Findings:      One benign-appearing, intrinsic severe stenosis was found 17 to 20 cm       from the incisors. This stenosis measured 7 mm (inner diameter). The       stenosis was traversed with the ultraslim endoscope. After the rest of       the EGD was complete, a guidewire was placed and the scope was       withdrawn. Dilation was performed with a Savary dilator with no       resistance at 8 mm and 10 mm and mild resistance at 12 mm. The dilation       site was examined following endoscope reinsertion and showed moderate       mucosal disruption, moderate improvement in luminal narrowing and no       perforation in the proximal esophagus with expected bleeding/oozing.  White nummular lesions were noted in the proximal esophagus and in the       mid esophagus. Biopsies were taken with a cold forceps for histology to       rule out Candida.      The Z-line was regular and was found 40 cm from the incisors.      A non-obstructing Schatzki ring was found at the gastroesophageal       junction.      A 2 cm hiatal hernia was present.      Multiple localized small erosions with no bleeding and no stigmata of       recent bleeding were found in the gastric body.      No other gross lesions were noted  in the entire examined stomach.       Biopsies were taken with a cold forceps for histology and Helicobacter       pylori testing.      No gross lesions were noted in the duodenal bulb, in the first portion       of the duodenum and in the second portion of the duodenum. 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 Previous dilation >2 months                            ago up to 9 mm. Dilated to 12 mm with mucosal                            wrents present and no overt perforation.                           - White nummular lesions in esophageal mucosa.                            Biopsied to rule out Candida.                           - Z-line regular, 40 cm from the incisors.                           - Non-obstructing Schatzki ring.                           - 2 cm hiatal hernia.                           - Erosive gastropathy with no bleeding and no                            stigmata of recent bleeding. No other gross lesions                            in the stomach. Biopsied.                           -  No gross lesions in the duodenal bulb, in the                            first portion of the duodenum and in the second                            portion of the duodenum. Moderate Sedation:      Not Applicable - Patient had care per Anesthesia. Recommendation:           - The patient will be observed post-procedure,                            until all discharge criteria are met.                           - Discharge patient to home.                           - Patient has a contact number available for                            emergencies. The signs and symptoms of potential                            delayed complications were discussed with the                            patient. Return to normal activities tomorrow.                            Written discharge instructions were provided to the                             patient.                           - Dilation diet as per protocol.                           - Please use Cepacol or Halls Lozenges +/-                            Chloraseptic spray for next 72-96 hours to aid in                            sore thoat should you experience this.                           - Restart Omeprazole 40 mg twice daily.                           - Await pathology results.                           - Repeat upper endoscopy for retreatment  in                            4-weeks, though patient felt that symptoms were                            improved significantly since last procedure so will                            allow patient to let us know PRN if issues of                            dysphagia recure/worsen.                           - Discussed his lower GI symptoms and states that                            his diarrhea is controlled with infrequent use of                            anti-motility agents. Deferred on flexible                            sigmoidoscopy at this time for biopsies (also                            patient did not prep for this either).                           - Follow up with ENT for surveillance of Head/Neck                            Malignancy as per protocol.                           - The findings and recommendations were discussed                            with the patient.                           - The findings and recommendations were discussed                            with the patient's family. Procedure Code(s):        --- Professional ---                           [phone], Esophagogastroduodenoscopy, flexible,                            transoral; with insertion of guide wire followed by  passage of dilator(s) through esophagus over guide                            wire Diagnosis Code(s):        --- Professional ---                           K22.2, Esophageal  obstruction                           K22.8, Other specified diseases of esophagus                           K44.9, Diaphragmatic hernia without obstruction or                            gangrene                           K31.89, Other diseases of stomach and duodenum                           R13.10, Dysphagia, unspecified CPT copyright 2019 [employer]. All rights reserved. The codes documented in this report are preliminary and upon coder review may  be revised to meet current compliance requirements. Justice Britain, MD 03/29/2020 9:47:44 AM Number of Addenda: 0

## 2020-03-29 NOTE — H&P (Signed)
GASTROENTEROLOGY PROCEDURE H&P NOTE   Primary Care Physician: [name], MD  HPI: [name] is a 60 y.o. male who presents for EGD for esophageal dilation.  Past Medical History:  Diagnosis Date  . Anemia   . Anxiety   . Arthritis    back  . Bradycardia    a. During 11/2012 adm: lopressor decreased.  . Cancer of larynx ([place])   . Carotid artery stenosis   . 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 Coronary artery disease    a. Diag cath 10/2012 for CP/abnormal nuc -> planned PCI s/p LAD atherectomy/DES placement 11/05/12.  . Depression   . Dilated aortic root ([place])   . GERD (gastroesophageal reflux disease)   . History of hiatal hernia   . Hypercholesteremia   . Hypertension   . PEG tube malfunction ([place])   . Pneumonia   . Stroke ([place]) 07/31/2018   bilateral vertebrobasilar occlusion; "left side weaker thanit was before."   Past Surgical History:  Procedure Laterality Date  . BIOPSY  11/24/2019   Procedure: BIOPSY;  Surgeon: Rush Landmark Telford Nab., MD;  Location: [place];  Service: Gastroenterology;;  . CARDIAC CATHETERIZATION  10/29/2012  . COLONOSCOPY WITH PROPOFOL N/A 11/24/2019   Procedure: COLONOSCOPY WITH PROPOFOL;  Surgeon: Rush Landmark Telford Nab., MD;  Location: [place];  Service: Gastroenterology;  Laterality: N/A;  . CORONARY ANGIOPLASTY WITH STENT PLACEMENT  11/05/2012  . DIRECT LARYNGOSCOPY N/A 01/02/2019   Procedure: MICRO DIRECT LARYNGOSCOPY BIOPSY OF LARYNGEAL MASS;  Surgeon: [name], MD;  [facility];  Service: ENT;  Laterality: N/A;  . ESOPHAGOGASTRODUODENOSCOPY (EGD) WITH PROPOFOL N/A 08/11/2019   Procedure: ATTEMPTED ESOPHAGOGASTRODUODENOSCOPY (EGD) WITH PROPOFOL (UNABLE TO PERFORM PERCUTANEOUS ENDOSCOPIC GASTROSTOMY TUBE REPLACEMENT);  Surgeon: [name], MD;  [facility];  Service: Gastroenterology;  Laterality: N/A;  .  ESOPHAGOGASTRODUODENOSCOPY (EGD) WITH PROPOFOL N/A 11/24/2019   Procedure: ESOPHAGOGASTRODUODENOSCOPY (EGD) WITH PROPOFOL;  Surgeon: Rush Landmark Telford Nab., MD;  Location: [place];  Service: Gastroenterology;  Laterality: N/A;  . ESOPHAGOGASTRODUODENOSCOPY (EGD) WITH PROPOFOL N/A 01/26/2020   Procedure: ESOPHAGOGASTRODUODENOSCOPY (EGD) WITH PROPOFOL;  Surgeon: Rush Landmark Telford Nab., MD;  Location: [place];  Service: Gastroenterology;  Laterality: N/A;  . HEMORRHOID SURGERY  ~ 2010  . INSERTION OF MESH N/A 06/02/2015   Procedure: INSERTION OF MESH;  Surgeon: [name], MD;  [facility];  Service: General;  Laterality: N/A;  . IR ANGIO INTRA EXTRACRAN SEL COM CAROTID INNOMINATE BILAT MOD SED  08/02/2018  . IR ANGIO VERTEBRAL SEL VERTEBRAL BILAT MOD SED  08/02/2018  . IR REPLACE G-TUBE SIMPLE WO FLUORO  08/12/2019  . IR REPLC GASTRO/COLONIC TUBE PERCUT W/FLUORO  08/13/2019  . LAPAROSCOPIC INSERTION GASTROSTOMY TUBE N/A 02/24/2019   Procedure: LAPAROSCOPIC INSERTION GASTROSTOMY TUBE;  Surgeon: [name], MD;  Location: [place];  Service: General;  Laterality: N/A;  . LEFT HEART CATHETERIZATION WITH CORONARY ANGIOGRAM N/A 10/30/2012   Procedure: LEFT HEART CATHETERIZATION WITH CORONARY ANGIOGRAM;  Surgeon: [name], MD;  Location: [place] [facility];  Service: Cardiovascular;  Laterality: N/A;  . MULTIPLE EXTRACTIONS WITH ALVEOLOPLASTY N/A 02/18/2019   Procedure: Extraction of tooth #'s 5-7, 10,11,14,20-29, 31 and 32 with alveoloplasty and maxiillary left buccal exostoses reductions;  Surgeon: [name], [name];  Location: [place];  Service: Oral Surgery;  Laterality: N/A;  . PEG PLACEMENT N/A 01/26/2020   Procedure: PERCUTANEOUS ENDOSCOPIC GASTROSTOMY (PEG) REPLACEMENT;  Surgeon: Irving Copas., MD;  Location: [place];  Service: Gastroenterology;  Laterality: N/A;  . PERCUTANEOUS CORONARY ROTOBLATOR INTERVENTION (PCI-R) N/A 11/05/2012   Procedure: PERCUTANEOUS CORONARY  ROTOBLATOR INTERVENTION (PCI-R);  Surgeon: [name], MD;  Location: [facility] C[facility];  Service: Cardiovascular;  Laterality: N/A;  . SAVORY DILATION N/A 11/24/2019   Procedure: SAVORY DILATION;  Surgeon: Irving Copas., MD;  Location: [place];  Service: Gastroenterology;  Laterality: N/A;  . SAVORY DILATION N/A 01/26/2020   Procedure: SAVORY DILATION;  Surgeon: Irving Copas., MD;  Location: [place];  Service: Gastroenterology;  Laterality: N/A;  . TRACHEOSTOMY TUBE PLACEMENT N/A 02/18/2019   Procedure: Tracheostomy;  Surgeon: [name], MD;  Location: [place] [facility];  Service: ENT;  Laterality: N/A;  . UMBILICAL HERNIA REPAIR N/A 06/02/2015   Procedure: UMBILICAL HERNIORRHAPHY WITH MESH;  Surgeon: [name], MD;  [facility];  Service: General;  Laterality: N/A;   Current Facility-Administered Medications  Medication Dose Route Frequency Provider Last Rate Last Admin  . 0.9 %  sodium chloride infusion   Intravenous Continuous Mansouraty, Telford Nab., MD      . lactated ringers infusion   Intravenous Continuous Mansouraty, Telford Nab., MD       Allergies  Allergen Reactions  . Duloxetine Other (See Comments)    Caused depression/ patient is taking  . Prednisone Other (See Comments)    Chest pain   Family History  Problem Relation Age of Onset  . Hypertension Mother   . Heart disease Father   . Hypertension Father   . Heart disease Sister   . Hypertension Maternal Grandmother   . Hypertension Maternal Grandfather   . Hypertension Paternal Grandmother   . Hypertension Paternal Grandfather   . Pancreatic cancer Maternal Uncle   . Breast cancer Maternal Aunt   . Breast cancer Maternal Aunt   . Diabetes Paternal Uncle   . Diabetes Paternal Aunt    Social History   Socioeconomic History  . Marital status: Married    Spouse name: [name]  . Number of children: 0  . Years of education: Not on file  . Highest education level: Not on file  Occupational  History  . Occupation: Disabled    Comment: previously worked in Nurse, learning disability as an Investment banker, corporate  . Smoking status: Former Smoker    Packs/day: 3.00    Years: 32.00    Pack years: 96.00    Types: Cigarettes    Start date: 08/29/1977    Quit date: 11/29/2006    Years since quitting: 13.3  . Smokeless tobacco: Former Systems developer    Types: Chew    Quit date: 2003  . Tobacco comment: 11/05/2012 "chewed tobacco when I play ball; haven't chewed since age 82"  Vaping Use  . Vaping Use: Never used  Substance and Sexual Activity  . Alcohol use: Yes    Alcohol/week: 12.0 standard drinks    Types: 12 Cans of beer per week    Comment: per wife [name] 6-7/week now as of 02/24/19  . Drug use: Not Currently    Types: Marijuana    Comment: Last use was on - denies on 04/08/19  . Sexual activity: Not Currently  Other Topics Concern  . Not on file  Social History Narrative   Patient is disabled.  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  Patient previously 3  pack/day use.   Patient currently drinking 3-4 beers per day from 12 pack a day.   Patient denies use of chew or other illicit drugs.   Social Determinants of Health   Financial Resource Strain: Not on file  Food Insecurity: No Food Insecurity  . Worried About Charity fundraiser in the Last Year: Never true  . Ran Out of Food in the Last Year: Never true  Transportation Needs: Not on file  Physical Activity: Not on file  Stress: Not on file  Social Connections: Not on file  Intimate Partner Violence: Not on file    Physical Exam: Vital signs in last 24 hours:     GEN: NAD EYE: Sclerae anicteric ENT: MMM CV: Non-tachycardic GI: Soft, NT/ND NEURO:  Alert & Oriented x 3  Lab Results: No results for input(s): WBC, HGB, HCT, PLT in the last 72 hours. BMET No results for input(s): NA, K, CL, CO2, GLUCOSE, BUN,  CREATININE, CALCIUM in the last 72 hours. LFT No results for input(s): PROT, ALBUMIN, AST, ALT, ALKPHOS, BILITOT, BILIDIR, IBILI in the last 72 hours. PT/INR No results for input(s): LABPROT, INR in the last 72 hours.   Impression / Plan: This is a 60 y.o.male who presents for EGD for esophageal dilation.  The risks and benefits of endoscopic evaluation were discussed with the patient; these include but are not limited to the risk of perforation, infection, bleeding, missed lesions, lack of diagnosis, severe illness requiring hospitalization, as well as anesthesia and sedation related illnesses.  The patient is agreeable to proceed.    Justice Britain, MD [facility] Office # [phone]

## 2020-03-30 ENCOUNTER — Encounter: Payer: Self-pay | Admitting: Cardiology

## 2020-03-30 NOTE — Progress Notes (Deleted)
Cardiology Office Note  Date: 03/30/2020   ID: [name], [name], [name], MRN [id number]  PCP:  [name], MD  Cardiologist:  Kate Sable, MD (Inactive) Electrophysiologist:  None   No chief complaint on file.   History of Present Illness: [name] is a 60 y.o. male former patient of Dr. Bronson Ing now presenting to establish follow-up with me.  I reviewed his records and updated the chart.  He was last seen in September 2021 by Ms. Dunn PA-C.  Chest CTA in October 2021 showed dilatation of the aortic root measuring 4.7 cm at the sinuses of Valsalva.  He follows with Dr. Delton Coombes, history of supraglottic squamous cell carcinoma.  I reviewed the recent note.  Past Medical History:  Diagnosis Date  . Anemia   . Anxiety   . Arthritis   . Cancer of larynx ([place])   . Carotid artery stenosis   . Chronic lower back pain   . Coronary artery disease    a. Diag cath 10/2012 for CP/abnormal nuc -> PCI s/p LAD atherectomy/DES placement 11/05/12.  . Depression   . Dilated aortic root ([place])   . GERD (gastroesophageal reflux disease)   . History of hiatal hernia   . Hypercholesteremia   . Hypertension   . Pneumonia   . S/P percutaneous endoscopic gastrostomy (PEG) tube placement ([place])   . Stroke [facility]) 07/31/2018   Pons    Past Surgical History:  Procedure Laterality Date  . BIOPSY  11/24/2019   Procedure: BIOPSY;  Surgeon: Rush Landmark Telford Nab., MD;  Location: [place];  Service: Gastroenterology;;  . CARDIAC CATHETERIZATION  10/29/2012  . COLONOSCOPY WITH PROPOFOL N/A 11/24/2019   Procedure: COLONOSCOPY WITH PROPOFOL;  Surgeon: Rush Landmark Telford Nab., MD;  Location: [place];  Service: Gastroenterology;  Laterality: N/A;  . CORONARY ANGIOPLASTY WITH STENT PLACEMENT  11/05/2012  . DIRECT LARYNGOSCOPY N/A 01/02/2019   Procedure: MICRO DIRECT LARYNGOSCOPY BIOPSY OF LARYNGEAL MASS;  Surgeon: [name], MD;  [facility];  Service: ENT;  Laterality:  N/A;  . ESOPHAGOGASTRODUODENOSCOPY (EGD) WITH PROPOFOL N/A 08/11/2019   Procedure: ATTEMPTED ESOPHAGOGASTRODUODENOSCOPY (EGD) WITH PROPOFOL (UNABLE TO PERFORM PERCUTANEOUS ENDOSCOPIC GASTROSTOMY TUBE REPLACEMENT);  Surgeon: [name], MD;  [facility];  Service: Gastroenterology;  Laterality: N/A;  . ESOPHAGOGASTRODUODENOSCOPY (EGD) WITH PROPOFOL N/A 11/24/2019   Procedure: ESOPHAGOGASTRODUODENOSCOPY (EGD) WITH PROPOFOL;  Surgeon: Rush Landmark Telford Nab., MD;  Location: [place];  Service: Gastroenterology;  Laterality: N/A;  . ESOPHAGOGASTRODUODENOSCOPY (EGD) WITH PROPOFOL N/A 01/26/2020   Procedure: ESOPHAGOGASTRODUODENOSCOPY (EGD) WITH PROPOFOL;  Surgeon: Rush Landmark Telford Nab., MD;  Location: [place];  Service: Gastroenterology;  Laterality: N/A;  . HEMORRHOID SURGERY  ~ 2010  . INSERTION OF MESH N/A 06/02/2015   Procedure: INSERTION OF MESH;  Surgeon: [name], MD;  [facility];  Service: General;  Laterality: N/A;  . IR ANGIO INTRA EXTRACRAN SEL COM CAROTID INNOMINATE BILAT MOD SED  08/02/2018  . IR ANGIO VERTEBRAL SEL VERTEBRAL BILAT MOD SED  08/02/2018  . IR REPLACE G-TUBE SIMPLE WO FLUORO  08/12/2019  . IR REPLC GASTRO/COLONIC TUBE PERCUT W/FLUORO  08/13/2019  . LAPAROSCOPIC INSERTION GASTROSTOMY TUBE N/A 02/24/2019   Procedure: LAPAROSCOPIC INSERTION GASTROSTOMY TUBE;  Surgeon: [name], MD;  Location: [place];  Service: General;  Laterality: N/A;  . LEFT HEART CATHETERIZATION WITH CORONARY ANGIOGRAM N/A 10/30/2012   Procedure: LEFT HEART CATHETERIZATION WITH CORONARY ANGIOGRAM;  Surgeon: [name], MD;  Location: [place] [facility];  Service: Cardiovascular;  Laterality: N/A;  .  MULTIPLE EXTRACTIONS WITH ALVEOLOPLASTY N/A 02/18/2019   Procedure: Extraction of tooth #'s 5-7, 10,11,14,20-29, 31 and 32 with alveoloplasty and maxiillary left buccal exostoses reductions;  Surgeon: [name], [name];  Location: [place];  Service: Oral Surgery;  Laterality: N/A;  . PEG  PLACEMENT N/A 01/26/2020   Procedure: PERCUTANEOUS ENDOSCOPIC GASTROSTOMY (PEG) REPLACEMENT;  Surgeon: Irving Copas., MD;  Location: [place];  Service: Gastroenterology;  Laterality: N/A;  . PERCUTANEOUS CORONARY ROTOBLATOR INTERVENTION (PCI-R) N/A 11/05/2012   Procedure: PERCUTANEOUS CORONARY ROTOBLATOR INTERVENTION (PCI-R);  Surgeon: [name], MD;  Location: [facility];  Service: Cardiovascular;  Laterality: N/A;  . SAVORY DILATION N/A 11/24/2019   Procedure: SAVORY DILATION;  Surgeon: Irving Copas., MD;  Location: [place];  Service: Gastroenterology;  Laterality: N/A;  . SAVORY DILATION N/A 01/26/2020   Procedure: SAVORY DILATION;  Surgeon: Irving Copas., MD;  Location: [place];  Service: Gastroenterology;  Laterality: N/A;  . TRACHEOSTOMY TUBE PLACEMENT N/A 02/18/2019   Procedure: Tracheostomy;  Surgeon: [name], MD;  Location: [place];  Service: ENT;  Laterality: N/A;  . UMBILICAL HERNIA REPAIR N/A 06/02/2015   Procedure: UMBILICAL HERNIORRHAPHY WITH MESH;  Surgeon: [name], MD;  [facility];  Service: General;  Laterality: N/A;    Current Outpatient Medications  Medication Sig Dispense Refill  . acetaminophen (TYLENOL) 325 MG tablet Take 2 tablets (650 mg total) by mouth every 6 (six) hours as needed for mild pain, fever or headache (or Fever >/= 101). 12 tablet 0  . amLODipine (NORVASC) 5 MG tablet Take 5 mg by mouth daily.    Marland Kitchen aspirin EC 81 MG tablet Take 81 mg by mouth daily.     Marland Kitchen atorvastatin (LIPITOR) 40 MG tablet Take 80 mg by mouth at bedtime.    . diazepam (VALIUM) 10 MG tablet Place 10 mg into feeding tube 3 (three) times daily as needed for anxiety or sleep.     . diphenoxylate-atropine (LOMOTIL) 2.5-0.025 MG tablet Take 2 tablets by mouth in the morning, at noon, and at bedtime. 90 tablet 0  . dronabinol (MARINOL) 5 MG capsule Take 1 capsule (5 mg total) by mouth 2 (two) times daily before a meal. 60 capsule 2  .  famotidine (PEPCID) 20 MG tablet Take 20 mg by mouth at bedtime.    Marland Kitchen HYDROcodone-acetaminophen (NORCO) 10-325 MG tablet Take 1 tablet by mouth every 4 (four) hours as needed. 50 tablet 0  . loperamide (IMODIUM) 2 MG capsule Take 2 mg by mouth daily as needed for diarrhea or loose stools.    . metoprolol tartrate (LOPRESSOR) 25 MG tablet Take 12.5 mg by mouth 2 (two) times daily.    . nitroGLYCERIN (NITROSTAT) 0.4 MG SL tablet Place 1 tablet (0.4 mg total) under the tongue every 5 (five) minutes as needed for chest pain (CP or SOB). 25 tablet 3  . omeprazole (PRILOSEC) 40 MG capsule Take 1 capsule (40 mg total) by mouth 2 (two) times daily before a meal. 60 capsule 5  . ondansetron (ZOFRAN) 4 MG tablet Take 1 tablet (4 mg total) by mouth every 8 (eight) hours as needed for nausea or vomiting. 4 tablet 0  . potassium chloride (KLOR-CON) 10 MEQ tablet Take 1 tablet (10 mEq total) by mouth daily. 7 tablet 0  . prochlorperazine (COMPAZINE) 10 MG tablet TAKE 1 TABLET(10 MG) BY MOUTH EVERY 6 HOURS AS NEEDED FOR NAUSEA 60 tablet 2  . traMADol (ULTRAM) 50 MG tablet Take 50-100 mg  by mouth 4 (four) times daily as needed for moderate pain (pain.).     No current facility-administered medications for this visit.   Allergies:  Duloxetine and Prednisone   Social History: The patient  reports that he quit smoking about 13 years ago. His smoking use included cigarettes. He started smoking about 42 years ago. He has a 96.00 pack-year smoking history. He quit smokeless tobacco use about 19 years ago.  His smokeless tobacco use included chew. He reports current alcohol use of about 12.0 standard drinks of alcohol per week. He reports previous drug use. Drug: Marijuana.   Family History: The patient's family history includes Breast cancer in his maternal aunt and maternal aunt; Diabetes in his paternal aunt and paternal uncle; Heart disease in his father and sister; Hypertension in his father, maternal grandfather,  maternal grandmother, mother, paternal grandfather, and paternal grandmother; Pancreatic cancer in his maternal uncle.   ROS:  Please see the history of present illness. Otherwise, complete review of systems is positive for {NONE DEFAULTED:18576::"none"}.  All other systems are reviewed and negative.   Physical Exam: VS:  There were no vitals taken for this visit., BMI There is no height or weight on file to calculate BMI.  Wt Readings from Last 3 Encounters:  03/29/20 130 lb 1.1 oz (59 kg)  03/23/20 126 lb (57.2 kg)  03/15/20 130 lb 14.4 oz (59.4 kg)    General: Patient appears comfortable at rest. HEENT: Conjunctiva and lids normal, oropharynx clear with moist mucosa. Neck: Supple, no elevated JVP or carotid bruits, no thyromegaly. Lungs: Clear to auscultation, nonlabored breathing at rest. Cardiac: Regular rate and rhythm, no S3 or significant systolic murmur, no pericardial rub. Abdomen: Soft, nontender, no hepatomegaly, bowel sounds present, no guarding or rebound. Extremities: No pitting edema, distal pulses 2+. Skin: Warm and dry. Musculoskeletal: No kyphosis. Neuropsychiatric: Alert and oriented x3, affect grossly appropriate.  ECG:  An ECG dated 02/22/2020 was personally reviewed today and demonstrated:  Sinus rhythm with nonspecific ST changes.  Recent Labwork: 03/10/2020: ALT 12; AST 18; BUN 9; Creatinine, Ser 0.83; Hemoglobin 12.6; Magnesium 1.6; Platelets 234; Potassium 2.9; Sodium 136     Component Value Date/Time   CHOL 167 12/24/2018 0918   TRIG 67 12/24/2018 0918   HDL 80 12/24/2018 0918   CHOLHDL 2.1 12/24/2018 0918   CHOLHDL 2.7 08/01/2018 0351   VLDL 20 08/01/2018 0351   LDLCALC 74 12/24/2018 0918    Other Studies Reviewed Today:  Carotid Dopplers 09/02/2019: Summary:  Right Carotid: Velocities in the right ICA are consistent with a 1-39%  stenosis,         however more accurate velocities were obscured due to  calcific         plaque.  Therefore, this stenosis is likely underestimated.   Left Carotid: Velocities in the left ICA are consistent with a 1-39%  stenosis.   Vertebrals: Bilateral vertebral arteries demonstrate antegrade flow.  Subclavians: Normal flow hemodynamics were seen in bilateral subclavian        arteries.   Echocardiogram 10/24/2019: 1. Left ventricular ejection fraction, by estimation, is 60 to 65%. The  left ventricle has normal function. The left ventricle has no regional  wall motion abnormalities. There is mild left ventricular hypertrophy.  Left ventricular diastolic parameters  are consistent with Grade I diastolic dysfunction (impaired relaxation).  2. Right ventricular systolic function is normal. The right ventricular  size is normal.  3. The mitral valve is normal in structure. No evidence  of mitral valve  regurgitation. No evidence of mitral stenosis.  4. The aortic valve is tricuspid. Aortic valve regurgitation is mild. No  aortic stenosis is present.  5. Aortic dilatation noted. There is moderate dilatation of the aortic  root, measuring 44 mm.  6. The inferior vena cava is normal in size with greater than 50%  respiratory variability, suggesting right atrial pressure of 3 mmHg.   Assessment and Plan:   Medication Adjustments/Labs and Tests Ordered: Current medicines are reviewed at length with the patient today.  Concerns regarding medicines are outlined above.   Tests Ordered: No orders of the defined types were placed in this encounter.   Medication Changes: No orders of the defined types were placed in this encounter.   Disposition:  Follow up {follow up:15908}  Signed, Satira Sark, MD, [facility] 03/30/2020 10:11 AM    [facility] [place]. [address] [place], [place] [address] Phone: [phone]; Fax: [phone]

## 2020-03-31 ENCOUNTER — Encounter (HOSPITAL_COMMUNITY): Payer: Self-pay | Admitting: Gastroenterology

## 2020-03-31 ENCOUNTER — Ambulatory Visit: Payer: 59 | Admitting: Cardiology

## 2020-03-31 DIAGNOSIS — I25119 Atherosclerotic heart disease of native coronary artery with unspecified angina pectoris: Secondary | ICD-10-CM

## 2020-03-31 LAB — SURGICAL PATHOLOGY

## 2020-04-01 ENCOUNTER — Encounter: Payer: Self-pay | Admitting: Gastroenterology

## 2020-04-13 ENCOUNTER — Observation Stay (HOSPITAL_COMMUNITY)
Admission: EM | Admit: 2020-04-13 | Discharge: 2020-04-15 | Disposition: A | Discharge status: Home / Self Care | Payer: 59 | Attending: Family Medicine | Admitting: Family Medicine

## 2020-04-13 ENCOUNTER — Other Ambulatory Visit: Payer: Self-pay

## 2020-04-13 ENCOUNTER — Encounter (HOSPITAL_COMMUNITY): Payer: Self-pay | Admitting: Emergency Medicine

## 2020-04-13 DIAGNOSIS — Z79899 Other long term (current) drug therapy: Secondary | ICD-10-CM | POA: Diagnosis not present

## 2020-04-13 DIAGNOSIS — I2510 Atherosclerotic heart disease of native coronary artery without angina pectoris: Secondary | ICD-10-CM | POA: Insufficient documentation

## 2020-04-13 DIAGNOSIS — Z8521 Personal history of malignant neoplasm of larynx: Secondary | ICD-10-CM | POA: Diagnosis not present

## 2020-04-13 DIAGNOSIS — Z7982 Long term (current) use of aspirin: Secondary | ICD-10-CM | POA: Diagnosis not present

## 2020-04-13 DIAGNOSIS — Z8673 Personal history of transient ischemic attack (TIA), and cerebral infarction without residual deficits: Secondary | ICD-10-CM | POA: Diagnosis not present

## 2020-04-13 DIAGNOSIS — E860 Dehydration: Secondary | ICD-10-CM | POA: Diagnosis not present

## 2020-04-13 DIAGNOSIS — Z87891 Personal history of nicotine dependence: Secondary | ICD-10-CM | POA: Insufficient documentation

## 2020-04-13 DIAGNOSIS — I10 Essential (primary) hypertension: Secondary | ICD-10-CM | POA: Insufficient documentation

## 2020-04-13 DIAGNOSIS — Z955 Presence of coronary angioplasty implant and graft: Secondary | ICD-10-CM | POA: Insufficient documentation

## 2020-04-13 DIAGNOSIS — R531 Weakness: Secondary | ICD-10-CM

## 2020-04-13 DIAGNOSIS — E876 Hypokalemia: Principal | ICD-10-CM | POA: Diagnosis present

## 2020-04-13 DIAGNOSIS — R5383 Other fatigue: Secondary | ICD-10-CM | POA: Diagnosis present

## 2020-04-13 DIAGNOSIS — R197 Diarrhea, unspecified: Secondary | ICD-10-CM | POA: Diagnosis present

## 2020-04-13 LAB — BASIC METABOLIC PANEL
Anion gap: 12 (ref 5–15)
BUN: 12 mg/dL (ref 6–20)
CO2: 33 mmol/L — ABNORMAL HIGH (ref 22–32)
Calcium: 8 mg/dL — ABNORMAL LOW (ref 8.9–10.3)
Chloride: 89 mmol/L — ABNORMAL LOW (ref 98–111)
Creatinine, Ser: 0.92 mg/dL (ref 0.61–1.24)
GFR, Estimated: >60 mL/min (ref >60)
Glucose, Bld: 108 mg/dL — ABNORMAL HIGH (ref 70–99)
Potassium: 2.3 mmol/L — CL (ref 3.5–5.1)
Sodium: 134 mmol/L — ABNORMAL LOW (ref 135–145)

## 2020-04-13 LAB — CBC WITH DIFFERENTIAL/PLATELET
Abs Immature Granulocytes: 0.02 10*3/uL (ref 0.00–0.07)
Basophils Absolute: 0.1 10*3/uL (ref 0.0–0.1)
Basophils Relative: 1 %
Eosinophils Absolute: 0.6 10*3/uL — ABNORMAL HIGH (ref 0.0–0.5)
Eosinophils Relative: 6 %
HCT: 36.5 % — ABNORMAL LOW (ref 39.0–52.0)
Hemoglobin: 12.2 g/dL — ABNORMAL LOW (ref 13.0–17.0)
Immature Granulocytes: 0 %
Lymphocytes Relative: 14 %
Lymphs Abs: 1.2 10*3/uL (ref 0.7–4.0)
MCH: 32.7 pg (ref 26.0–34.0)
MCHC: 33.4 g/dL (ref 30.0–36.0)
MCV: 97.9 fL (ref 80.0–100.0)
Monocytes Absolute: 0.6 10*3/uL (ref 0.1–1.0)
Monocytes Relative: 7 %
Neutro Abs: 6.1 10*3/uL (ref 1.7–7.7)
Neutrophils Relative %: 72 %
Platelets: 277 10*3/uL (ref 150–400)
RBC: 3.73 MIL/uL — ABNORMAL LOW (ref 4.22–5.81)
RDW: 14.4 % (ref 11.5–15.5)
WBC: 8.6 10*3/uL (ref 4.0–10.5)
nRBC: 0 % (ref 0.0–0.2)

## 2020-04-13 LAB — URINALYSIS, ROUTINE W REFLEX MICROSCOPIC
Bilirubin Urine: NEGATIVE
Glucose, UA: NEGATIVE mg/dL
Hgb urine dipstick: NEGATIVE
Ketones, ur: NEGATIVE mg/dL
Leukocytes,Ua: NEGATIVE
Nitrite: NEGATIVE
Protein, ur: NEGATIVE mg/dL
Specific Gravity, Urine: 1.025 (ref 1.005–1.030)
pH: 5 (ref 5.0–8.0)

## 2020-04-13 LAB — MAGNESIUM: Magnesium: 1.6 mg/dL — ABNORMAL LOW (ref 1.7–2.4)

## 2020-04-13 MED ORDER — ACETAMINOPHEN 650 MG RE SUPP: 650.0000 mg | Freq: Four times a day (QID) | RECTAL | Status: DC | PRN

## 2020-04-13 MED ORDER — FAMOTIDINE 20 MG PO TABS
20.0000 mg | ORAL_TABLET | Freq: Every day | ORAL | Status: DC
  Administered 2020-04-13 – 2020-04-14 (×2): 20 mg via ORAL
  Filled 2020-04-13 (×2): qty 1

## 2020-04-13 MED ORDER — ATORVASTATIN CALCIUM 40 MG PO TABS
80.0000 mg | ORAL_TABLET | Freq: Every day | ORAL | Status: DC
  Administered 2020-04-13 – 2020-04-14 (×2): 80 mg via ORAL
  Filled 2020-04-13 (×2): qty 2

## 2020-04-13 MED ORDER — SODIUM CHLORIDE 0.9 % IV BOLUS
1000.0000 mL | Freq: Once | INTRAVENOUS | Status: AC
  Administered 2020-04-13: 1000 mL via INTRAVENOUS

## 2020-04-13 MED ORDER — ACETAMINOPHEN 325 MG PO TABS: 650.0000 mg | ORAL_TABLET | Freq: Four times a day (QID) | ORAL | Status: DC | PRN

## 2020-04-13 MED ORDER — OXYCODONE HCL 5 MG PO TABS
5.0000 mg | ORAL_TABLET | ORAL | Status: DC | PRN
  Administered 2020-04-13 – 2020-04-15 (×5): 5 mg via ORAL
  Filled 2020-04-13 (×5): qty 1

## 2020-04-13 MED ORDER — ONDANSETRON HCL 4 MG/2ML IJ SOLN: 4.0000 mg | Freq: Four times a day (QID) | INTRAMUSCULAR | Status: DC | PRN

## 2020-04-13 MED ORDER — ACETAMINOPHEN 500 MG PO TABS
1000.0000 mg | ORAL_TABLET | Freq: Once | ORAL | Status: AC
  Administered 2020-04-13: 1000 mg via ORAL
  Filled 2020-04-13: qty 2

## 2020-04-13 MED ORDER — PANTOPRAZOLE SODIUM 40 MG PO TBEC
40.0000 mg | DELAYED_RELEASE_TABLET | Freq: Every day | ORAL | Status: DC
  Administered 2020-04-14 – 2020-04-15 (×2): 40 mg via ORAL
  Filled 2020-04-13 (×2): qty 1

## 2020-04-13 MED ORDER — HEPARIN SODIUM (PORCINE) 5000 UNIT/ML IJ SOLN
5000.0000 [IU] | Freq: Three times a day (TID) | INTRAMUSCULAR | Status: DC
  Administered 2020-04-13 – 2020-04-15 (×5): 5000 [IU] via SUBCUTANEOUS
  Filled 2020-04-13 (×5): qty 1

## 2020-04-13 MED ORDER — POTASSIUM CHLORIDE 10 MEQ/100ML IV SOLN
10.0000 meq | INTRAVENOUS | Status: AC
  Administered 2020-04-13 (×3): 10 meq via INTRAVENOUS
  Filled 2020-04-13 (×3): qty 100

## 2020-04-13 MED ORDER — MAGNESIUM SULFATE 2 GM/50ML IV SOLN
2.0000 g | Freq: Once | INTRAVENOUS | Status: AC
  Administered 2020-04-13: 2 g via INTRAVENOUS
  Filled 2020-04-13: qty 50

## 2020-04-13 MED ORDER — ONDANSETRON HCL 4 MG PO TABS: 4.0000 mg | ORAL_TABLET | Freq: Four times a day (QID) | ORAL | Status: DC | PRN

## 2020-04-13 MED ORDER — POTASSIUM CHLORIDE CRYS ER 20 MEQ PO TBCR
40.0000 meq | EXTENDED_RELEASE_TABLET | Freq: Once | ORAL | Status: AC
  Administered 2020-04-13: 40 meq via ORAL
  Filled 2020-04-13: qty 2

## 2020-04-13 MED ORDER — ASPIRIN EC 81 MG PO TBEC
81.0000 mg | DELAYED_RELEASE_TABLET | Freq: Every day | ORAL | Status: DC
  Administered 2020-04-14 – 2020-04-15 (×2): 81 mg via ORAL
  Filled 2020-04-13 (×2): qty 1

## 2020-04-13 MED ORDER — TRAMADOL HCL 50 MG PO TABS: 50.0000 mg | ORAL_TABLET | Freq: Four times a day (QID) | ORAL | Status: DC | PRN

## 2020-04-13 MED ORDER — POTASSIUM CHLORIDE IN NACL 40-0.9 MEQ/L-% IV SOLN: INTRAVENOUS | Status: DC

## 2020-04-13 MED ORDER — DRONABINOL 5 MG PO CAPS
5.0000 mg | ORAL_CAPSULE | Freq: Two times a day (BID) | ORAL | Status: DC
  Administered 2020-04-14 – 2020-04-15 (×3): 5 mg via ORAL
  Filled 2020-04-13 (×3): qty 1

## 2020-04-13 NOTE — ED Provider Notes (Addendum)
[facility] EMERGENCY DEPARTMENT Provider Note   CSN: [id number] Arrival date & time: 04/13/20  1534     History Chief Complaint  Patient presents with  . Hypotension    [name] is a 60 y.o. 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  He was seen by his PCP today who could not get a blood pressure reading and was sent here for further evaluation and treatment of suspected dehydration and also concern for possible worsening anemia.  Patient denies significant risk for dehydration, believes he is hydrating appropriately.  He denies specific symptoms including fevers, no nausea or vomiting, denies abdominal pain, chest pain, shortness of breath, no cough or URI type symptoms.  Also denies dysuria or reduced urinary frequency.  He reports his appetite is fair, states he takes twice daily Marinol to help boost his appetite, his last meal was breakfast this morning.  The history is provided by the patient.       Past Medical History:  Diagnosis Date  . Anemia   . Anxiety   . Arthritis   . Cancer of larynx ([place])   . Carotid artery stenosis   . Chronic lower back pain   . Coronary artery disease    a. Diag cath 10/2012 for CP/abnormal nuc -> PCI s/p LAD atherectomy/DES placement 11/05/12.  . Depression   . Dilated aortic root ([place])   . GERD (gastroesophageal reflux disease)   . History of hiatal hernia   . Hypercholesteremia   . Hypertension   . Pneumonia   . S/P percutaneous endoscopic gastrostomy (PEG) tube placement ([place])   . Stroke [facility]) 07/31/2018   Pons    Patient Active Problem List   Diagnosis Date Noted  . Hypokalemia 04/13/2020  . PEG (percutaneous endoscopic gastrostomy) adjustment/replacement/removal ([place]) 08/11/2019  . PEG tube malfunction ([place])  08/10/2019  . HCAP (healthcare-associated pneumonia) 04/10/2019  . Febrile illness, acute 04/08/2019  . Malnutrition of moderate degree 04/03/2019  . GI bleed 04/01/2019  . Dehydration 03/28/2019  . RLL pneumonia 03/17/2019  . Hx of tracheostomy 02/18/2019  . Head and neck cancer ([place]) 02/18/2019  . 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 Stroke ([place]) 07/31/2018  . Rotator cuff syndrome of right shoulder 02/20/2013  . Bradycardia 11/06/2012  . Coronary artery disease   . Abnormal finding on cardiovascular stress test 09/25/2012  . Hypertension 09/03/2012  . Chest pain 09/03/2012  . Alcohol abuse, daily use 09/03/2012  . Hyperlipidemia 09/03/2012  . Hyponatremia 09/03/2012    Past Surgical History:  Procedure Laterality Date  . BIOPSY  11/24/2019   Procedure: BIOPSY;  Surgeon: Rush Landmark Telford Nab., MD;  Location: [place];  Service: Gastroenterology;;  . BIOPSY  03/29/2020   Procedure: BIOPSY;  Surgeon: Irving Copas., MD;  Location: [facility];  Service: Gastroenterology;;  . CARDIAC CATHETERIZATION  10/29/2012  . COLONOSCOPY WITH PROPOFOL N/A 11/24/2019   Procedure: COLONOSCOPY WITH PROPOFOL;  Surgeon: Rush Landmark Telford Nab., MD;  Location: [place];  Service: Gastroenterology;  Laterality: N/A;  . CORONARY ANGIOPLASTY WITH STENT PLACEMENT  11/05/2012  . DIRECT LARYNGOSCOPY N/A 01/02/2019   Procedure: MICRO DIRECT LARYNGOSCOPY BIOPSY OF LARYNGEAL MASS;  Surgeon: [name], MD;  Location: [place];  Service: ENT;  Laterality: N/A;  . ESOPHAGOGASTRODUODENOSCOPY (EGD) WITH PROPOFOL N/A 08/11/2019   Procedure: ATTEMPTED ESOPHAGOGASTRODUODENOSCOPY (EGD)  WITH PROPOFOL (UNABLE TO PERFORM PERCUTANEOUS ENDOSCOPIC GASTROSTOMY TUBE REPLACEMENT);  Surgeon: [name], MD;  Location: [facility];  Service: Gastroenterology;  Laterality: N/A;  . ESOPHAGOGASTRODUODENOSCOPY (EGD) WITH PROPOFOL N/A 11/24/2019   Procedure: ESOPHAGOGASTRODUODENOSCOPY (EGD) WITH  PROPOFOL;  Surgeon: Rush Landmark Telford Nab., MD;  Location: [place];  Service: Gastroenterology;  Laterality: N/A;  . ESOPHAGOGASTRODUODENOSCOPY (EGD) WITH PROPOFOL N/A 01/26/2020   Procedure: ESOPHAGOGASTRODUODENOSCOPY (EGD) WITH PROPOFOL;  Surgeon: Rush Landmark Telford Nab., MD;  Location: [place];  Service: Gastroenterology;  Laterality: N/A;  . ESOPHAGOGASTRODUODENOSCOPY (EGD) WITH PROPOFOL N/A 03/29/2020   Procedure: ESOPHAGOGASTRODUODENOSCOPY (EGD) WITH PROPOFOL;  Surgeon: Rush Landmark Telford Nab., MD;  Location: [facility];  Service: Gastroenterology;  Laterality: N/A;  NEEDS FLOURO  . HEMORRHOID SURGERY  ~ 2010  . INSERTION OF MESH N/A 06/02/2015   Procedure: INSERTION OF MESH;  Surgeon: [name], MD;  Location: [facility];  Service: General;  Laterality: N/A;  . IR ANGIO INTRA EXTRACRAN SEL COM CAROTID INNOMINATE BILAT MOD SED  08/02/2018  . IR ANGIO VERTEBRAL SEL VERTEBRAL BILAT MOD SED  08/02/2018  . IR REPLACE G-TUBE SIMPLE WO FLUORO  08/12/2019  . IR REPLC GASTRO/COLONIC TUBE PERCUT W/FLUORO  08/13/2019  . LAPAROSCOPIC INSERTION GASTROSTOMY TUBE N/A 02/24/2019   Procedure: LAPAROSCOPIC INSERTION GASTROSTOMY TUBE;  Surgeon: [name], MD;  Location: [place];  Service: General;  Laterality: N/A;  . LEFT HEART CATHETERIZATION WITH CORONARY ANGIOGRAM N/A 10/30/2012   Procedure: LEFT HEART CATHETERIZATION WITH CORONARY ANGIOGRAM;  Surgeon: [name], MD;  Location: [place] [facility];  Service: Cardiovascular;  Laterality: N/A;  . MULTIPLE EXTRACTIONS WITH ALVEOLOPLASTY N/A 02/18/2019   Procedure: Extraction of tooth #'s 5-7, 10,11,14,20-29, 31 and 32 with alveoloplasty and maxiillary left buccal exostoses reductions;  Surgeon: [name], [name];  Location: [place];  Service: Oral Surgery;  Laterality: N/A;  . PEG PLACEMENT N/A 01/26/2020   Procedure: PERCUTANEOUS ENDOSCOPIC GASTROSTOMY (PEG) REPLACEMENT;  Surgeon: Irving Copas., MD;  Location: [place];  Service:  Gastroenterology;  Laterality: N/A;  . PERCUTANEOUS CORONARY ROTOBLATOR INTERVENTION (PCI-R) N/A 11/05/2012   Procedure: PERCUTANEOUS CORONARY ROTOBLATOR INTERVENTION (PCI-R);  Surgeon: [name], MD;  Location: [facility] CATH LAB;  Service: Cardiovascular;  Laterality: N/A;  . SAVORY DILATION N/A 11/24/2019   Procedure: SAVORY DILATION;  Surgeon: Irving Copas., MD;  Location: [place];  Service: Gastroenterology;  Laterality: N/A;  . SAVORY DILATION N/A 01/26/2020   Procedure: SAVORY DILATION;  Surgeon: Irving Copas., MD;  Location: [place];  Service: Gastroenterology;  Laterality: N/A;  . TRACHEOSTOMY TUBE PLACEMENT N/A 02/18/2019   Procedure: Tracheostomy;  Surgeon: [name], MD;  Location: [name];  Service: ENT;  Laterality: N/A;  . UMBILICAL HERNIA REPAIR N/A 06/02/2015   Procedure: UMBILICAL HERNIORRHAPHY WITH MESH;  Surgeon: [name], MD;  Location: [facility];  Service: General;  Laterality: N/A;       Family History  Problem Relation Age of Onset  . Hypertension Mother   . Heart disease Father   . Hypertension Father   . Heart disease Sister   . Hypertension Maternal Grandmother   . Hypertension Maternal Grandfather   . Hypertension Paternal Grandmother   . Hypertension Paternal Grandfather   . Pancreatic cancer Maternal Uncle   . Breast cancer Maternal Aunt   . Breast cancer Maternal Aunt   . Diabetes Paternal Uncle   . Diabetes Paternal Aunt     Social History   Tobacco Use  . Smoking status: Former Smoker  Packs/day: 3.00    Years: 32.00    Pack years: 96.00    Types: Cigarettes    Start date: 08/29/1977    Quit date: 11/29/2006    Years since quitting: 13.3  . Smokeless tobacco: Former Systems developer    Types: Chew    Quit date: 2003  . Tobacco comment: 11/05/2012 "chewed tobacco when I play ball; haven't chewed since age 21"  Vaping Use  . Vaping Use: Never used  Substance Use Topics  . Alcohol use: Yes    Alcohol/week: 12.0 standard  drinks    Types: 12 Cans of beer per week    Comment: per wife [name] 6-7/week now as of 02/24/19  . Drug use: Not Currently    Types: Marijuana    Comment: Last use was on - denies on 04/08/19    Home Medications Prior to Admission medications   Medication Sig Start Date End Date Taking? Authorizing Provider  acetaminophen (TYLENOL) 325 MG tablet Take 2 tablets (650 mg total) by mouth every 6 (six) hours as needed for mild pain, fever or headache (or Fever >/= 101). 04/14/19  Yes Emokpae, Courage, MD  amLODipine (NORVASC) 5 MG tablet Take 5 mg by mouth daily.   Yes [provider]  aspirin EC 81 MG tablet Take 81 mg by mouth daily.    Yes [provider]  atorvastatin (LIPITOR) 40 MG tablet Take 80 mg by mouth at bedtime. 01/17/20  Yes [provider]  diphenoxylate-atropine (LOMOTIL) 2.5-0.025 MG tablet Take 2 tablets by mouth in the morning, at noon, and at bedtime. 02/17/20  Yes [name], MD  dronabinol (MARINOL) 5 MG capsule Take 1 capsule (5 mg total) by mouth 2 (two) times daily before a meal. 02/17/20  Yes [name], MD  famotidine (PEPCID) 20 MG tablet Take 20 mg by mouth at bedtime. 01/22/20  Yes [provider]  HYDROcodone-acetaminophen (NORCO) 10-325 MG tablet Take 1 tablet by mouth every 4 (four) hours as needed. 06/02/15  Yes [name], MD  loperamide (IMODIUM) 2 MG capsule Take 2 mg by mouth daily as needed for diarrhea or loose stools.   Yes [provider]  metoprolol tartrate (LOPRESSOR) 25 MG tablet Take 12.5 mg by mouth 2 (two) times daily.   Yes [provider]  nitroGLYCERIN (NITROSTAT) 0.4 MG SL tablet Place 1 tablet (0.4 mg total) under the tongue every 5 (five) minutes as needed for chest pain (CP or SOB). 07/27/14  Yes Herminio Commons, MD  potassium chloride (KLOR-CON) 10 MEQ tablet Take 1 tablet (10 mEq total) by mouth daily. 02/22/20  Yes Horton, Kristie M, DO  prochlorperazine (COMPAZINE)  10 MG tablet TAKE 1 TABLET(10 MG) BY MOUTH EVERY 6 HOURS AS NEEDED FOR NAUSEA Patient taking differently: Take 10 mg by mouth every 6 (six) hours as needed for nausea. 08/19/19  Yes Lockamy, Randi L, NP-C  traMADol (ULTRAM) 50 MG tablet Take 50-100 mg by mouth 4 (four) times daily as needed for moderate pain (pain.). 01/29/19  Yes [provider]  diazepam (VALIUM) 10 MG tablet Place 10 mg into feeding tube 3 (three) times daily as needed for anxiety or sleep.  Patient not taking: Reported on 04/13/2020 05/28/19   [provider]  omeprazole (PRILOSEC) 40 MG capsule Take 1 capsule (40 mg total) by mouth 2 (two) times daily before a meal. Patient not taking: No sig reported 03/29/20   Mansouraty, Telford Nab., MD  ondansetron (ZOFRAN) 4 MG tablet Take 1 tablet (  4 mg total) by mouth every 8 (eight) hours as needed for nausea or vomiting. Patient not taking: No sig reported 02/22/20   Horton, Kristie M, DO    Allergies    Duloxetine and Prednisone  Review of Systems   Review of Systems  Constitutional: Positive for fatigue. Negative for appetite change, chills and fever.  HENT: Negative for congestion and sore throat.   Eyes: Negative.   Respiratory: Negative for chest tightness and shortness of breath.   Cardiovascular: Negative for chest pain.  Gastrointestinal: Negative for abdominal pain, nausea and vomiting.  Genitourinary: Negative.   Musculoskeletal: Negative for arthralgias, joint swelling and neck pain.  Skin: Negative.  Negative for rash and wound.  Neurological: Positive for weakness. Negative for dizziness, light-headedness, numbness and headaches.  Psychiatric/Behavioral: Negative.     Physical Exam Updated Vital Signs BP (!) 140/93   Pulse (!) 57   Temp (!) 97.5 F (36.4 C) (Oral)   Resp 12   SpO2 100%   Physical Exam Vitals and nursing note reviewed.  Constitutional:      General: He is not in acute distress.    Appearance: He is well-developed.      Comments: Cachectic   HENT:     Head: Normocephalic and atraumatic.     Mouth/Throat:     Mouth: Mucous membranes are moist.  Eyes:     Comments: Pale conjunctiva  Cardiovascular:     Rate and Rhythm: Normal rate and regular rhythm.     Heart sounds: Normal heart sounds.  Pulmonary:     Effort: Pulmonary effort is normal.     Breath sounds: Normal breath sounds. No wheezing.  Abdominal:     General: Abdomen is flat. Bowel sounds are normal.     Palpations: Abdomen is soft.     Tenderness: There is no abdominal tenderness.  Musculoskeletal:        General: Normal range of motion.     Cervical back: Normal range of motion.  Skin:    General: Skin is warm and dry.  Neurological:     Mental Status: He is alert and oriented to person, place, and time.  Psychiatric:        Mood and Affect: Mood normal.     ED Results / Procedures / Treatments   Labs (all labs ordered are listed, but only abnormal results are displayed) Labs Reviewed  CBC WITH DIFFERENTIAL/PLATELET - Abnormal; Notable for the following components:      Result Value   RBC 3.73 (*)    Hemoglobin 12.2 (*)    HCT 36.5 (*)    Eosinophils Absolute 0.6 (*)    All other components within normal limits  BASIC METABOLIC PANEL - Abnormal; Notable for the following components:   Sodium 134 (*)    Potassium 2.3 (*)    Chloride 89 (*)    CO2 33 (*)    Glucose, Bld 108 (*)    Calcium 8.0 (*)    All other components within normal limits  URINALYSIS, ROUTINE W REFLEX MICROSCOPIC - Abnormal; Notable for the following components:   Color, Urine AMBER (*)    All other components within normal limits  MAGNESIUM    EKG EKG Interpretation  Date/Time:  Tuesday April 13 2020 16:37:15 EDT Ventricular Rate:  57 PR Interval:    QRS Duration: 109 QT Interval:  512 QTC Calculation: 499 R Axis:   83 Text Interpretation: Junctional rhythm Borderline prolonged QT interval No STEMI Confirmed by Trifan,  Rodman Key [phone]) on  04/13/2020 5:28:39 PM   Radiology No results found.  Procedures Procedures   CRITICAL CARE Performed by: Evalee Jefferson Total critical care time: 45 minutes Critical care time was exclusive of separately billable procedures and treating other patients. Critical care was necessary to treat or prevent imminent or life-threatening deterioration. Critical care was time spent personally by me on the following activities: development of treatment plan with patient and/or surrogate as well as nursing, discussions with consultants, evaluation of patient's response to treatment, examination of patient, obtaining history from patient or surrogate, ordering and performing treatments and interventions, ordering and review of laboratory studies, ordering and review of radiographic studies, pulse oximetry and re-evaluation of patient's condition.   Medications Ordered in ED Medications  potassium chloride 10 mEq in 100 mL IVPB (10 mEq Intravenous New Bag/Given 04/13/20 1851)  potassium chloride SA (KLOR-CON) CR tablet 40 mEq (has no administration in time range)  sodium chloride 0.9 % bolus 1,000 mL (0 mLs Intravenous Stopped 04/13/20 1819)  acetaminophen (TYLENOL) tablet 1,000 mg (1,000 mg Oral Given 04/13/20 1648)  sodium chloride 0.9 % bolus 1,000 mL (1,000 mLs Intravenous New Bag/Given 04/13/20 1819)    ED Course  I have reviewed the triage vital signs and the nursing notes.  Pertinent labs & imaging results that were available during my care of the patient were reviewed by me and considered in my medical decision making (see chart for details).    MDM Rules/Calculators/A&P                          Patient presenting with generalized weakness and hypotension who is orthostatic here, he was given IV fluids.  Labs are significant also for significant hypokalemia with a potassium level of 2.3.  He was ordered 10 mEq runs x3.  He will require admission for further treatment of this hypokalemia.  A  magnesium level has also been drawn.  Discussed with Dr. Clearence Ped who accepts pt for admission Final Clinical Impression(s) / ED Diagnoses Final diagnoses:  Hypokalemia  Weakness  Dehydration    Rx / DC Orders ED Discharge Orders    None       Landis Martins 04/13/20 1911    Wyvonnia Dusky, MD 04/14/20 0931    Evalee Jefferson, PA-C 04/30/20 1303    Wyvonnia Dusky, MD 05/01/20 [phone]

## 2020-04-13 NOTE — ED Triage Notes (Signed)
Pt states his Dr. Gabriel Carina could not get his bp today.  Reports dizziness, dry mouth and fatigue that started x 2days ago.

## 2020-04-13 NOTE — H&P (Signed)
TRH H&P    Patient Demographics:    [name], is a 60 y.o. male  MRN: [id number]  DOB - December 12, 1960  Admit Date - 04/13/2020  Referring MD/NP/PA: Alfonzo Feller  Outpatient Primary MD for the patient is [name], MD  Patient coming from: Home  Chief complaint- diarrhea   HPI:    [name]  is a 60 y.o. 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  Patient reports significant diarrhea for last 2 weeks.  It occurs every time he eats and eats approximately twice a day.  His last episode was earlier today.  He reports that it appears watery, brownish/white, without blood.  He seen no green or black color.  He reports it is pure water without any formed stool mixed in.  He does use Lomotil and Imodium without relief.  He reports he started those 2 medications 1 week ago.  He denies any fevers, abdominal pain, nausea vomiting.  His last normal meal was 24 hours ago.  He has had difficult time with poor appetite since his last chemo treatment.  He has been on Marinol and that helps.  He also smokes marijuana and reports that that helps as well.  His last chemo was 6 months ago.  Patient reports that he has experienced dizziness at home.  Especially upon standing.  No dizziness at rest.  He complains of dry mouth as well.  Chart reveals that patient did have unintentional weight loss for which he had a CT of his abdomen approximately 30 days ago.  Was no abdominal/pelvic findings, mass lesions or lymphadenopathy.  He had diffuse bladder wall thickening without a discrete lesion.  Patient does not smoke, does not drink alcohol, does use marijuana, is not vaccinated for COVID but wants to be, and is full code.  In the ED Temp 97.5, heart rate 57,  respiratory rate 12, blood pressure 140/93 No leukocytosis of white blood cell count of 8.6, hemoglobin is 12.2 Chemistry panel reveals a slight hyponatremia at 134, hypokalemia 2.3, hypomagnesemia at 1.6, hypochloremia at 89, increased bicarbonate 33 UA is negative EKG shows sinus bradycardia at 57 Admission requested for further management of hypokalemia and hypomagnesemia     Review of systems:    In addition to the HPI above,  No Fever-chills, No Headache, No changes with Vision or hearing, No problems swallowing food or Liquids, No Chest pain, Cough or Shortness of Breath, No Abdominal pain, No Nausea or Vomiting,  No Blood in stool or Urine, No dysuria, No new skin rashes or bruises, No new joints pains-aches,  No new weakness, tingling, numbness in any extremity, No recent weight gain or loss, No polyuria, polydypsia or polyphagia, No significant Mental Stressors.  All other systems reviewed and are negative.    Past History of the following :    Past Medical History:  Diagnosis Date  . Anemia   . Anxiety   .  Arthritis   . Cancer of larynx ([place])   . Carotid artery stenosis   . Chronic lower back pain   . Coronary artery disease    a. Diag cath 10/2012 for CP/abnormal nuc -> PCI s/p LAD atherectomy/DES placement 11/05/12.  . Depression   . Dilated aortic root ([place])   . GERD (gastroesophageal reflux disease)   . History of hiatal hernia   . Hypercholesteremia   . Hypertension   . Pneumonia   . S/P percutaneous endoscopic gastrostomy (PEG) tube placement ([place])   . Stroke [facility]) 07/31/2018   Pons      Past Surgical History:  Procedure Laterality Date  . BIOPSY  11/24/2019   Procedure: BIOPSY;  Surgeon: Rush Landmark Telford Nab., MD;  Location: [place];  Service: Gastroenterology;;  . BIOPSY  03/29/2020   Procedure: BIOPSY;  Surgeon: Irving Copas., MD;  Location: [facility];  Service: Gastroenterology;;  . CARDIAC CATHETERIZATION  10/29/2012   . COLONOSCOPY WITH PROPOFOL N/A 11/24/2019   Procedure: COLONOSCOPY WITH PROPOFOL;  Surgeon: Rush Landmark Telford Nab., MD;  Location: [place];  Service: Gastroenterology;  Laterality: N/A;  . CORONARY ANGIOPLASTY WITH STENT PLACEMENT  11/05/2012  . DIRECT LARYNGOSCOPY N/A 01/02/2019   Procedure: MICRO DIRECT LARYNGOSCOPY BIOPSY OF LARYNGEAL MASS;  Surgeon: [name], MD;  [facility];  Service: ENT;  Laterality: N/A;  . ESOPHAGOGASTRODUODENOSCOPY (EGD) WITH PROPOFOL N/A 08/11/2019   Procedure: ATTEMPTED ESOPHAGOGASTRODUODENOSCOPY (EGD) WITH PROPOFOL (UNABLE TO PERFORM PERCUTANEOUS ENDOSCOPIC GASTROSTOMY TUBE REPLACEMENT);  Surgeon: [name], MD;  Location: [facility];  Service: Gastroenterology;  Laterality: N/A;  . ESOPHAGOGASTRODUODENOSCOPY (EGD) WITH PROPOFOL N/A 11/24/2019   Procedure: ESOPHAGOGASTRODUODENOSCOPY (EGD) WITH PROPOFOL;  Surgeon: Rush Landmark Telford Nab., MD;  Location: [place];  Service: Gastroenterology;  Laterality: N/A;  . ESOPHAGOGASTRODUODENOSCOPY (EGD) WITH PROPOFOL N/A 01/26/2020   Procedure: ESOPHAGOGASTRODUODENOSCOPY (EGD) WITH PROPOFOL;  Surgeon: Rush Landmark Telford Nab., MD;  Location: [place];  Service: Gastroenterology;  Laterality: N/A;  . ESOPHAGOGASTRODUODENOSCOPY (EGD) WITH PROPOFOL N/A 03/29/2020   Procedure: ESOPHAGOGASTRODUODENOSCOPY (EGD) WITH PROPOFOL;  Surgeon: Rush Landmark Telford Nab., MD;  Location: [facility];  Service: Gastroenterology;  Laterality: N/A;  NEEDS FLOURO  . HEMORRHOID SURGERY  ~ 2010  . INSERTION OF MESH N/A 06/02/2015   Procedure: INSERTION OF MESH;  Surgeon: [name], MD;  Location: [facility];  Service: General;  Laterality: N/A;  . IR ANGIO INTRA EXTRACRAN SEL COM CAROTID INNOMINATE BILAT MOD SED  08/02/2018  . IR ANGIO VERTEBRAL SEL VERTEBRAL BILAT MOD SED  08/02/2018  . IR REPLACE G-TUBE SIMPLE WO FLUORO  08/12/2019  . IR REPLC GASTRO/COLONIC TUBE PERCUT W/FLUORO  08/13/2019  . LAPAROSCOPIC INSERTION GASTROSTOMY TUBE N/A  02/24/2019   Procedure: LAPAROSCOPIC INSERTION GASTROSTOMY TUBE;  Surgeon: [name], MD;  Location: [place];  Service: General;  Laterality: N/A;  . LEFT HEART CATHETERIZATION WITH CORONARY ANGIOGRAM N/A 10/30/2012   Procedure: LEFT HEART CATHETERIZATION WITH CORONARY ANGIOGRAM;  Surgeon: [name], MD;  Location: [facility];  Service: Cardiovascular;  Laterality: N/A;  . MULTIPLE EXTRACTIONS WITH ALVEOLOPLASTY N/A 02/18/2019   Procedure: Extraction of tooth #'s 5-7, 10,11,14,20-29, 31 and 32 with alveoloplasty and maxiillary left buccal exostoses reductions;  Surgeon: [name], [name];  Location: [place];  Service: Oral Surgery;  Laterality: N/A;  . PEG PLACEMENT N/A 01/26/2020   Procedure: PERCUTANEOUS ENDOSCOPIC GASTROSTOMY (PEG) REPLACEMENT;  Surgeon: Irving Copas., MD;  Location: [place];  Service: Gastroenterology;  Laterality: N/A;  . PERCUTANEOUS CORONARY ROTOBLATOR INTERVENTION (PCI-R) N/A 11/05/2012  Procedure: PERCUTANEOUS CORONARY ROTOBLATOR INTERVENTION (PCI-R);  Surgeon: [name], MD;  Location: [facility];  Service: Cardiovascular;  Laterality: N/A;  . SAVORY DILATION N/A 11/24/2019   Procedure: SAVORY DILATION;  Surgeon: Irving Copas., MD;  Location: [place];  Service: Gastroenterology;  Laterality: N/A;  . SAVORY DILATION N/A 01/26/2020   Procedure: SAVORY DILATION;  Surgeon: Irving Copas., MD;  Location: [place];  Service: Gastroenterology;  Laterality: N/A;  . TRACHEOSTOMY TUBE PLACEMENT N/A 02/18/2019   Procedure: Tracheostomy;  Surgeon: [name], MD;  Location: [place];  Service: ENT;  Laterality: N/A;  . UMBILICAL HERNIA REPAIR N/A 06/02/2015   Procedure: UMBILICAL HERNIORRHAPHY WITH MESH;  Surgeon: [name], MD;  [facility];  Service: General;  Laterality: N/A;      Social History:      Social History   Tobacco Use  . Smoking status: Former Smoker    Packs/day: 3.00    Years: 32.00    Pack  years: 96.00    Types: Cigarettes    Start date: 08/29/1977    Quit date: 11/29/2006    Years since quitting: 13.3  . Smokeless tobacco: Former Systems developer    Types: Chew    Quit date: 2003  . Tobacco comment: 11/05/2012 "chewed tobacco when I play ball; haven't chewed since age 12"  Substance Use Topics  . Alcohol use: Yes    Alcohol/week: 12.0 standard drinks    Types: 12 Cans of beer per week    Comment: per wife [name] 6-7/week now as of 02/24/19       Family History :     Family History  Problem Relation Age of Onset  . Hypertension Mother   . Heart disease Father   . Hypertension Father   . Heart disease Sister   . Hypertension Maternal Grandmother   . Hypertension Maternal Grandfather   . Hypertension Paternal Grandmother   . Hypertension Paternal Grandfather   . Pancreatic cancer Maternal Uncle   . Breast cancer Maternal Aunt   . Breast cancer Maternal Aunt   . Diabetes Paternal Uncle   . Diabetes Paternal Aunt       Home Medications:   Prior to Admission medications   Medication Sig Start Date End Date Taking? Authorizing Provider  acetaminophen (TYLENOL) 325 MG tablet Take 2 tablets (650 mg total) by mouth every 6 (six) hours as needed for mild pain, fever or headache (or Fever >/= 101). 04/14/19  Yes Emokpae, Courage, MD  amLODipine (NORVASC) 5 MG tablet Take 5 mg by mouth daily.   Yes [provider]  aspirin EC 81 MG tablet Take 81 mg by mouth daily.    Yes [provider]  atorvastatin (LIPITOR) 40 MG tablet Take 80 mg by mouth at bedtime. 01/17/20  Yes [provider]  diphenoxylate-atropine (LOMOTIL) 2.5-0.025 MG tablet Take 2 tablets by mouth in the morning, at noon, and at bedtime. 02/17/20  Yes [name], MD  dronabinol (MARINOL) 5 MG capsule Take 1 capsule (5 mg total) by mouth 2 (two) times daily before a meal. 02/17/20  Yes [name], MD  famotidine (PEPCID) 20 MG tablet Take 20 mg by mouth at bedtime. 01/22/20   Yes [provider]  HYDROcodone-acetaminophen (NORCO) 10-325 MG tablet Take 1 tablet by mouth every 4 (four) hours as needed. 06/02/15  Yes [name], MD  loperamide (IMODIUM) 2 MG capsule Take 2 mg by mouth daily as needed for diarrhea or loose stools.  Yes [provider]  metoprolol tartrate (LOPRESSOR) 25 MG tablet Take 12.5 mg by mouth 2 (two) times daily.   Yes [provider]  nitroGLYCERIN (NITROSTAT) 0.4 MG SL tablet Place 1 tablet (0.4 mg total) under the tongue every 5 (five) minutes as needed for chest pain (CP or SOB). 07/27/14  Yes Herminio Commons, MD  potassium chloride (KLOR-CON) 10 MEQ tablet Take 1 tablet (10 mEq total) by mouth daily. 02/22/20  Yes Horton, Kristie M, DO  prochlorperazine (COMPAZINE) 10 MG tablet TAKE 1 TABLET(10 MG) BY MOUTH EVERY 6 HOURS AS NEEDED FOR NAUSEA Patient taking differently: Take 10 mg by mouth every 6 (six) hours as needed for nausea. 08/19/19  Yes Lockamy, Randi L, NP-C  traMADol (ULTRAM) 50 MG tablet Take 50-100 mg by mouth 4 (four) times daily as needed for moderate pain (pain.). 01/29/19  Yes [provider]  diazepam (VALIUM) 10 MG tablet Place 10 mg into feeding tube 3 (three) times daily as needed for anxiety or sleep.  Patient not taking: Reported on 04/13/2020 05/28/19   [provider]  omeprazole (PRILOSEC) 40 MG capsule Take 1 capsule (40 mg total) by mouth 2 (two) times daily before a meal. Patient not taking: No sig reported 03/29/20   Mansouraty, Telford Nab., MD  ondansetron (ZOFRAN) 4 MG tablet Take 1 tablet (4 mg total) by mouth every 8 (eight) hours as needed for nausea or vomiting. Patient not taking: No sig reported 02/22/20   Horton, Alvin Critchley, DO     Allergies:     Allergies  Allergen Reactions  . Duloxetine Other (See Comments)    Caused depression/ patient is taking  . Prednisone Other (See Comments)    Chest pain     Physical Exam:   Vitals  Blood pressure 120/79,  pulse 63, temperature (!) 97.4 F (36.3 C), temperature source Oral, resp. rate 15, height 6' (1.829 m), weight 54.9 kg, SpO2 100 %.  1.  General: Patient lying supine in bed in no acute distress  2. Psychiatric: Flat affect, alert, oriented x3, behavior normal for situation, cooperative with exam  3. Neurologic: Speech and language are normal, face is symmetric, moves all 4 extremities voluntarily, alert and oriented x3, no acute deficit limited exam  4. HEENMT:  Head is atraumatic, normocephalic, pupils reactive to light, neck is cachectic, trachea is midline, mucous membranes are mildly dry  5. Respiratory : Lungs are clear to auscultation bilaterally without wheezes, rhonchi, rales, no clubbing, no cyanosis  6. Cardiovascular : Heart rate is normal, rhythm is regular, no murmurs rubs or gallops, no peripheral edema  7. Gastrointestinal:  Abdomen is soft, nondistended, nontender to palpation, bowel sounds active  8. Skin:  Skin is warm dry and intact without acute lesion on limited exam  9.Musculoskeletal:  Cachectic extremities, no acute deformities, no calf tenderness, peripheral pulses palpated    Data Review:    CBC Recent Labs  Lab 04/13/20 1653  WBC 8.6  HGB 12.2*  HCT 36.5*  PLT 277  MCV 97.9  MCH 32.7  MCHC 33.4  RDW 14.4  LYMPHSABS 1.2  MONOABS 0.6  EOSABS 0.6*  BASOSABS 0.1   ------------------------------------------------------------------------------------------------------------------  Results for orders placed or performed during the hospital encounter of 04/13/20 (from the past 48 hour(s))  CBC with Differential/Platelet     Status: Abnormal   Collection Time: 04/13/20  4:53 PM  Result Value Ref Range   WBC 8.6 4.0 - 10.5 K/uL   RBC 3.73 (L)  4.22 - 5.81 MIL/uL   Hemoglobin 12.2 (L) 13.0 - 17.0 g/dL   HCT 36.5 (L) 39.0 - 52.0 %   MCV 97.9 80.0 - 100.0 fL   MCH 32.7 26.0 - 34.0 pg   MCHC 33.4 30.0 - 36.0 g/dL   RDW 14.4 11.5 - 15.5 %    Platelets 277 150 - 400 K/uL   nRBC 0.0 0.0 - 0.2 %   Neutrophils Relative % 72 %   Neutro Abs 6.1 1.7 - 7.7 K/uL   Lymphocytes Relative 14 %   Lymphs Abs 1.2 0.7 - 4.0 K/uL   Monocytes Relative 7 %   Monocytes Absolute 0.6 0.1 - 1.0 K/uL   Eosinophils Relative 6 %   Eosinophils Absolute 0.6 (H) 0.0 - 0.5 K/uL   Basophils Relative 1 %   Basophils Absolute 0.1 0.0 - 0.1 K/uL   Immature Granulocytes 0 %   Abs Immature Granulocytes 0.02 0.00 - 0.07 K/uL    Comment: Performed at [facility], [address] [place], [place] [address]  Basic metabolic panel     Status: Abnormal   Collection Time: 04/13/20  4:53 PM  Result Value Ref Range   Sodium 134 (L) 135 - 145 mmol/L   Potassium 2.3 (LL) 3.5 - 5.1 mmol/L    Comment: CRITICAL RESULT CALLED TO, READ BACK BY AND VERIFIED WITH: WHITE,M. RN @1819  04/13/20 [place]    Chloride 89 (L) 98 - 111 mmol/L   CO2 33 (H) 22 - 32 mmol/L   Glucose, Bld 108 (H) 70 - 99 mg/dL    Comment: Glucose reference range applies only to samples taken after fasting for at least 8 hours.   BUN 12 6 - 20 mg/dL   Creatinine, Ser 0.92 0.61 - 1.24 mg/dL   Calcium 8.0 (L) 8.9 - 10.3 mg/dL   GFR, Estimated >60 >60 mL/min    Comment: (NOTE) Calculated using the CKD-EPI Creatinine Equation (2021)    Anion gap 12 5 - 15    Comment: Performed at [facility], [address] [place], [place] [address]  Magnesium     Status: Abnormal   Collection Time: 04/13/20  4:53 PM  Result Value Ref Range   Magnesium 1.6 (L) 1.7 - 2.4 mg/dL    Comment: Performed at [facility], [address], [place], [place] [address]  Urinalysis, Routine w reflex microscopic Urine, Clean Catch     Status: Abnormal   Collection Time: 04/13/20  5:19 PM  Result Value Ref Range   Color, Urine AMBER (A) YELLOW    Comment: BIOCHEMICALS MAY BE AFFECTED BY COLOR   APPearance CLEAR CLEAR   Specific Gravity, Urine 1.025 1.005 - 1.030   pH 5.0 5.0 - 8.0   Glucose, UA NEGATIVE NEGATIVE mg/dL    Hgb urine dipstick NEGATIVE NEGATIVE   Bilirubin Urine NEGATIVE NEGATIVE   Ketones, ur NEGATIVE NEGATIVE mg/dL   Protein, ur NEGATIVE NEGATIVE mg/dL   Nitrite NEGATIVE NEGATIVE   Leukocytes,Ua NEGATIVE NEGATIVE    Comment: Performed at [facility], [address] [place],  [address]    Chemistries  Recent Labs  Lab 04/13/20 1653  NA 134*  K 2.3*  CL 89*  CO2 33*  GLUCOSE 108*  BUN 12  CREATININE 0.92  CALCIUM 8.0*  MG 1.6*   ------------------------------------------------------------------------------------------------------------------  ------------------------------------------------------------------------------------------------------------------ GFR: Estimated Creatinine Clearance: 68 mL/min (by C-G formula based on SCr of 0.92 mg/dL). Liver Function Tests: No results for input(s): AST, ALT, ALKPHOS, BILITOT, PROT, ALBUMIN in the  last 168 hours. No results for input(s): LIPASE, AMYLASE in the last 168 hours. No results for input(s): AMMONIA in the last 168 hours. Coagulation Profile: No results for input(s): INR, PROTIME in the last 168 hours. Cardiac Enzymes: No results for input(s): CKTOTAL, CKMB, CKMBINDEX, TROPONINI in the last 168 hours. BNP (last 3 results) No results for input(s): PROBNP in the last 8760 hours. HbA1C: No results for input(s): HGBA1C in the last 72 hours. CBG: No results for input(s): GLUCAP in the last 168 hours. Lipid Profile: No results for input(s): CHOL, HDL, LDLCALC, TRIG, CHOLHDL, LDLDIRECT in the last 72 hours. Thyroid Function Tests: No results for input(s): TSH, T4TOTAL, FREET4, T3FREE, THYROIDAB in the last 72 hours. Anemia Panel: No results for input(s): VITAMINB12, FOLATE, FERRITIN, TIBC, IRON, RETICCTPCT in the last 72 hours.  --------------------------------------------------------------------------------------------------------------- Urine analysis:    Component Value Date/Time   COLORURINE AMBER (A) 04/13/2020  1719   APPEARANCEUR CLEAR 04/13/2020 1719   LABSPEC 1.025 04/13/2020 1719   PHURINE 5.0 04/13/2020 1719   GLUCOSEU NEGATIVE 04/13/2020 1719   HGBUR NEGATIVE 04/13/2020 1719   BILIRUBINUR NEGATIVE 04/13/2020 1719   KETONESUR NEGATIVE 04/13/2020 1719   PROTEINUR NEGATIVE 04/13/2020 1719   NITRITE NEGATIVE 04/13/2020 1719   LEUKOCYTESUR NEGATIVE 04/13/2020 1719      Imaging Results:    No results found.  My personal review of EKG: Rhythm sinus bradycardia, Rate 57 /min, QTc499,no Acute ST changes   Assessment & Plan:    Principal Problem:   Hypokalemia Active Problems:   Dehydration   Diarrhea   Hypomagnesemia   1. Hypokalemia and hypomagnesemia 1. Potassium 2.3, magnesium 1.6 2. Secondary to GI losses 3. 70 mEq replaced in ED, continue normal saline with 40 mEq potassium 4. Monitor on telemetry 5. 2 g of mag sulfate 6. Trend in the a.m. 2. Diarrhea 1. No recent antibiotics 2. Lomotil and Imodium over-the-counter, but symptoms continue 3. C. difficile testing pending 4. Continue to monitor 3. Dehydration 1. Secondary to GI losses and poor p.o. intake 2. With positive orthostatics, monitor orthostatics daily 3. Mild hyponatremia 4. Continue replacement with normal saline as above 5. Continue to monitor 4. HLD 1. Continue statin medication 5. Hypertension 1. Hold antihypertensives   DVT Prophylaxis-   heparin - SCDs   AM Labs Ordered, also please review Full Orders  Family Communication: No family at bedside  Code Status:  Full  Admission status: Observation  Time spent in minutes : [place] DO

## 2020-04-14 ENCOUNTER — Encounter (HOSPITAL_COMMUNITY): Payer: Self-pay | Admitting: Family Medicine

## 2020-04-14 DIAGNOSIS — E876 Hypokalemia: Secondary | ICD-10-CM | POA: Diagnosis not present

## 2020-04-14 LAB — RENAL FUNCTION PANEL
Albumin: 2.1 g/dL — ABNORMAL LOW (ref 3.5–5.0)
Anion gap: 9 (ref 5–15)
BUN: 8 mg/dL (ref 6–20)
CO2: 28 mmol/L (ref 22–32)
Calcium: 7.5 mg/dL — ABNORMAL LOW (ref 8.9–10.3)
Chloride: 99 mmol/L (ref 98–111)
Creatinine, Ser: 0.65 mg/dL (ref 0.61–1.24)
GFR, Estimated: >60 mL/min (ref >60)
Glucose, Bld: 76 mg/dL (ref 70–99)
Phosphorus: 1.7 mg/dL — ABNORMAL LOW (ref 2.5–4.6)
Potassium: 3.6 mmol/L (ref 3.5–5.1)
Sodium: 136 mmol/L (ref 135–145)

## 2020-04-14 LAB — CBC
HCT: 31.2 % — ABNORMAL LOW (ref 39.0–52.0)
Hemoglobin: 10.3 g/dL — ABNORMAL LOW (ref 13.0–17.0)
MCH: 32.2 pg (ref 26.0–34.0)
MCHC: 33 g/dL (ref 30.0–36.0)
MCV: 97.5 fL (ref 80.0–100.0)
Platelets: 235 10*3/uL (ref 150–400)
RBC: 3.2 MIL/uL — ABNORMAL LOW (ref 4.22–5.81)
RDW: 14.2 % (ref 11.5–15.5)
WBC: 6.2 10*3/uL (ref 4.0–10.5)
nRBC: 0 % (ref 0.0–0.2)

## 2020-04-14 LAB — C DIFFICILE QUICK SCREEN W PCR REFLEX
C Diff antigen: NEGATIVE
C Diff interpretation: NOT DETECTED
C Diff toxin: NEGATIVE

## 2020-04-14 LAB — HIV ANTIBODY (ROUTINE TESTING W REFLEX): HIV Screen 4th Generation wRfx: NONREACTIVE

## 2020-04-14 LAB — MAGNESIUM: Magnesium: 2 mg/dL (ref 1.7–2.4)

## 2020-04-14 MED ORDER — LOPERAMIDE HCL 2 MG PO CAPS: 2.0000 mg | ORAL_CAPSULE | Freq: Four times a day (QID) | ORAL | Status: DC | PRN

## 2020-04-14 MED ORDER — K PHOS MONO-SOD PHOS DI & MONO 155-852-130 MG PO TABS
250.0000 mg | ORAL_TABLET | Freq: Three times a day (TID) | ORAL | Status: DC
  Administered 2020-04-14 – 2020-04-15 (×3): 250 mg via ORAL
  Filled 2020-04-14 (×3): qty 1

## 2020-04-14 MED ORDER — POTASSIUM CHLORIDE CRYS ER 20 MEQ PO TBCR
40.0000 meq | EXTENDED_RELEASE_TABLET | Freq: Once | ORAL | Status: AC
  Administered 2020-04-14: 40 meq via ORAL
  Filled 2020-04-14: qty 2

## 2020-04-14 MED ORDER — METOPROLOL TARTRATE 25 MG PO TABS
12.5000 mg | ORAL_TABLET | Freq: Two times a day (BID) | ORAL | Status: DC
  Administered 2020-04-14 – 2020-04-15 (×2): 12.5 mg via ORAL
  Filled 2020-04-14 (×3): qty 1

## 2020-04-14 NOTE — Progress Notes (Signed)
Patient Demographics:    [name], is a 60 y.o. male, DOB - 1960-12-01, KPT:[id number]  Admit date - 04/13/2020   Admitting Physician Rolla Plate, DO  Outpatient Primary MD for the patient is [name], MD  LOS - 0   Chief Complaint  Patient presents with  . Hypotension        Subjective:    [name] today has no fevers, no emesis,  No chest pain,   C/o fatigue, abd pain and diarrhea -Diarrhea persist, oral intake is not great  Assessment  & Plan :    Principal Problem:   Hypokalemia Active Problems:   Dehydration   Diarrhea   Hypomagnesemia  Brief Summary:-  60 y.o. male, with history of stroke, hypertension, hypercholesterolemia, GERD, depression, coronary artery disease with prior stents in 2014, history of prior CVA, cancer of the larynx status post surgery and chemo admitted on 04/13/20 with electrolyte derangement in the setting of persistent diarrhea  A/p 1)Hypokalemia/hypophosphatemia and Hypomagnesemia---due to Gi Losses, replace orally and iv  2)Persistent Diarrhea--- No recent antibiotics c diff neg Stool cx/Gi panel pending  Lomotil and Imodium over-the-counter, but symptoms continue  3)h/o CVA/CAD-- continue statin and aspirin, metoprolol restarted  4)Hypertension--- restart metoprolol, hold amlodipine   5)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 Delton Coombes   Disposition/Need for in-Hospital Stay- patient unable to be discharged at this time due to --diarrhea with electrolyte derangement requiring IV and oral replacement -Awaiting better tolerance of oral intake and improvement in diarrhea prior to discharge  Disposition: The patient is from: Home              Anticipated d/c is to: Home              Anticipated d/c date is: 1 day              Patient currently is not  medically stable to d/c. Barriers: Not Clinically Stable-   Code Status :  -  Code Status: Full Code   Family Communication:    NA (patient is alert, awake and coherent)   Consults  :    DVT Prophylaxis  :   - SCDs  heparin injection 5,000 Units Start: 04/13/20 2215 SCDs Start: 04/13/20 2120    Lab Results  Component Value Date   PLT 235 04/14/2020    Inpatient Medications  Scheduled Meds: . aspirin EC  81 mg Oral Daily  . atorvastatin  80 mg Oral QHS  . dronabinol  5 mg Oral BID AC  . famotidine  20 mg Oral QHS  . heparin  5,000 Units Subcutaneous Q8H  . pantoprazole  40 mg Oral Daily  . potassium chloride  40 mEq Oral Once   Continuous Infusions: . 0.9 % NaCl with KCl 40 mEq / L 100 mL/hr at 04/14/20 0838   PRN Meds:.acetaminophen **OR** acetaminophen, ondansetron **OR** ondansetron (ZOFRAN) IV, oxyCODONE, traMADol    Anti-infectives (From admission, onward)   None        Objective:   Vitals:   04/13/20 2108 04/13/20 2124 04/14/20 0520 04/14/20 1348  BP: 120/79  112/78 115/72  Pulse: 63  70 72  Resp: 15  20 17  Temp: (!) 97.4 F (36.3 C)  (!) 97.1 F (36.2 C) 97.6 F (36.4 C)  TempSrc: Oral   Oral  SpO2: 100%  97% 97%  Weight:  54.9 kg    Height:  6' (1.829 m)      Wt Readings from Last 3 Encounters:  04/13/20 54.9 kg  03/29/20 59 kg  03/23/20 57.2 kg     Intake/Output Summary (Last 24 hours) at 04/14/2020 1707 Last data filed at 04/14/2020 1600 Gross per 24 hour  Intake 3930.53 ml  Output 700 ml  Net 3230.53 ml     Physical Exam  Gen:- Awake Alert,  In no apparent distress  HEENT:- [place].AT, No sclera icterus Neck-Supple Neck,No JVD,.  Lungs-  CTAB , fair symmetrical air movement CV- S1, S2 normal, regular  Abd-  +ve B.Sounds, Abd Soft, generalized abdominal discomfort on palpation, no rebound or guarding, Extremity/Skin:- No  edema, pedal pulses present  Psych-affect is appropriate, oriented x3 Neuro-no new focal deficits, no  tremors   Data Review:   Micro Results Recent Results (from the past 240 hour(s))  C Difficile Quick Screen w PCR reflex     Status: None   Collection Time: 04/14/20 10:30 AM   Specimen: STOOL  Result Value Ref Range Status   C Diff antigen NEGATIVE NEGATIVE Final   C Diff toxin NEGATIVE NEGATIVE Final   C Diff interpretation No C. difficile detected.  Final    Comment: NEGATIVE Performed at [facility], [address], [place], [place] [address]     Radiology Reports No results found.   CBC Recent Labs  Lab 04/13/20 1653 04/14/20 0452  WBC 8.6 6.2  HGB 12.2* 10.3*  HCT 36.5* 31.2*  PLT 277 235  MCV 97.9 97.5  MCH 32.7 32.2  MCHC 33.4 33.0  RDW 14.4 14.2  LYMPHSABS 1.2  --   MONOABS 0.6  --   EOSABS 0.6*  --   BASOSABS 0.1  --     Chemistries  Recent Labs  Lab 04/13/20 1653 04/14/20 0452  NA 134* 136  K 2.3* 3.6  CL 89* 99  CO2 33* 28  GLUCOSE 108* 76  BUN 12 8  CREATININE 0.92 0.65  CALCIUM 8.0* 7.5*  MG 1.6* 2.0   ------------------------------------------------------------------------------------------------------------------ No results for input(s): CHOL, HDL, LDLCALC, TRIG, CHOLHDL, LDLDIRECT in the last 72 hours.  Lab Results  Component Value Date   HGBA1C 5.4 08/01/2018   ------------------------------------------------------------------------------------------------------------------ No results for input(s): TSH, T4TOTAL, T3FREE, THYROIDAB in the last 72 hours.  Invalid input(s): FREET3 ------------------------------------------------------------------------------------------------------------------ No results for input(s): VITAMINB12, FOLATE, FERRITIN, TIBC, IRON, RETICCTPCT in the last 72 hours.  Coagulation profile No results for input(s): INR, PROTIME in the last 168 hours.  No results for input(s): DDIMER in the last 72 hours.  Cardiac Enzymes No results for input(s): CKMB, TROPONINI, MYOGLOBIN in the last 168 hours.  Invalid  input(s): CK ------------------------------------------------------------------------------------------------------------------ No results found for: BNP   Roxan Hockey M.D on 04/14/2020 at 5:07 PM  Go to [email or web address] - for contact info  [facility] - Office  [phone]

## 2020-04-15 DIAGNOSIS — E876 Hypokalemia: Secondary | ICD-10-CM | POA: Diagnosis not present

## 2020-04-15 LAB — GASTROINTESTINAL PANEL BY PCR, STOOL (REPLACES STOOL CULTURE)

## 2020-04-15 LAB — BASIC METABOLIC PANEL
Anion gap: 7 (ref 5–15)
BUN: 5 mg/dL — ABNORMAL LOW (ref 6–20)
CO2: 27 mmol/L (ref 22–32)
Calcium: 7.8 mg/dL — ABNORMAL LOW (ref 8.9–10.3)
Chloride: 102 mmol/L (ref 98–111)
Creatinine, Ser: 0.65 mg/dL (ref 0.61–1.24)
GFR, Estimated: >60 mL/min (ref >60)
Glucose, Bld: 88 mg/dL (ref 70–99)
Potassium: 5 mmol/L (ref 3.5–5.1)
Sodium: 136 mmol/L (ref 135–145)

## 2020-04-15 LAB — CORTISOL-AM, BLOOD: Cortisol - AM: 7.3 ug/dL (ref 6.7–22.6)

## 2020-04-15 MED ORDER — ASPIRIN EC 81 MG PO TBEC: 81.0000 mg | DELAYED_RELEASE_TABLET | Freq: Every day | ORAL | 4 refills | Status: DC

## 2020-04-15 MED ORDER — OMEPRAZOLE 40 MG PO CPDR: 40.0000 mg | DELAYED_RELEASE_CAPSULE | Freq: Two times a day (BID) | ORAL | 5 refills | Status: DC

## 2020-04-15 MED ORDER — DRONABINOL 5 MG PO CAPS: 5.0000 mg | ORAL_CAPSULE | Freq: Two times a day (BID) | ORAL | 2 refills | Status: DC

## 2020-04-15 MED ORDER — MIRTAZAPINE 7.5 MG PO TABS: 7.5000 mg | ORAL_TABLET | Freq: Every day | ORAL | 2 refills | Status: DC

## 2020-04-15 NOTE — Care Plan (Signed)
Pt and wife given AVS at this time, they deny questions. Wife states she is concerned from his sleepiness. Educated wife to discuss with PCP at f/u appt. IV removed at this time. Educated pt on Marinol and medications ordered and when to take. Denies questions at this time.

## 2020-04-15 NOTE — TOC Transition Note (Addendum)
Transition of Care [facility]) - CM/SW Discharge Note   Patient Details  Name: [name] MRN: [id number] Date of Birth: [birth year]  Transition of Care [facility]) CM/SW Contact:  Natasha Bence, LCSW Phone Number: 04/15/2020, 11:49 AM   Clinical Narrative:    CSW notified of patient's readiness for discharge and [place] needs. CSW spoke with patient's family to inquire if agreeable to [facility] Patient agreeable. CSW referred patient to [facility]. Marjory Lies with [facility] agreeable to take patient. Marjory Lies reported that their is a 5 day delay for services. CSW notified MD. TOC signing off.    Final next level of care: [place] Barriers to Discharge: Barriers Resolved   Patient Goals and CMS Choice Patient states their goals for this hospitalization and ongoing recovery are:: Return home with [facility] CMS Medicare.gov Compare Post Acute Care list provided to:: Patient Choice offered to / list presented to : Patient  Discharge Placement                  Name of family member notified: [name] Hickman Patient and family notified of of transfer: 04/15/20  Discharge Plan and Services                DME Arranged: N/A DME Agency: NA       HH Arranged: PT [place] [facility]: [facility] [employer] Date [place]: 04/15/20 Time [place]: 1148 Representative spoke with at [place]: [place] ([place]) Interventions     Readmission Risk Interventions Readmission Risk Prevention Plan 08/13/2019 08/13/2019 04/10/2019  Transportation Screening - Complete Complete  Medication Review Press photographer) - Complete Complete  PCP or Specialist appointment within 3-5 days of discharge (No Data) - -  [place]RI or [place] - Complete Complete  Palliative Care Screening - Not Applicable Not [place] - Not Applicable Not Applicable  Some recent data might be hidden

## 2020-04-15 NOTE — Discharge Instructions (Signed)
1)Avoid ibuprofen/Advil/Aleve/Motrin/Goody Powders/Naproxen/BC powders/Meloxicam/Diclofenac/Indomethacin and other Nonsteroidal anti-inflammatory medications as these will make you more likely to bleed and can cause stomach ulcers, can also cause Kidney problems.   2)Repeat CBC and BMP within 1 week with PCP

## 2020-04-15 NOTE — Evaluation (Signed)
Physical Therapy Evaluation Patient Details Name: [name] MRN: [id number] DOB: 1960/10/24 Today's Date: 04/15/2020   History of Present Illness  [name]  is a 60 y.o. 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  Patient reports significant diarrhea for last 2 weeks.  It occurs every time he eats and eats approximately twice a day.  His last episode was earlier today.  He reports that it appears watery, brownish/white, without blood.  He seen no green or black color.  He reports it is pure water without any formed stool mixed in.  He does use Lomotil and Imodium without relief.  He reports he started those 2 medications 1 week ago.  He denies any fevers, abdominal pain, nausea vomiting.  His last normal meal was 24 hours ago.  He has had difficult time with poor appetite since his last chemo treatment.  He has been on Marinol and that helps.  He also smokes marijuana and reports that that helps as well.  His last chemo was 6 months ago.  Patient reports that he has experienced dizziness at home.  Especially upon standing.  No dizziness at rest.  He complains of dry mouth as well.    Clinical Impression  Patient demonstrates good return for sitting up at bedside and transferring to chair, but very unsteady on feet with frequent scissoring of legs when making turns, walks with excessive BLE internal rotation and requires use of RW for safety.  Patient's spouse present in room and instructed to have patient use RW and provide supervision when he returns home with understanding acknowledged.  Plan: patient to be discharged home today and discharged from physical therapy to care of nursing for ambulation daily as tolerated.     Follow Up Recommendations Home health PT;Supervision for mobility/OOB;Supervision/Assistance - 24  hour    Equipment Recommendations  None recommended by PT    Recommendations for Other Services       Precautions / Restrictions Precautions Precautions: Fall Restrictions Weight Bearing Restrictions: No      Mobility  Bed Mobility Overal bed mobility: Needs Assistance Bed Mobility: Supine to Sit     Supine to sit: Supervision     General bed mobility comments: increased time, labored movement    Transfers Overall transfer level: Needs assistance Equipment used: None;Rolling walker (2 wheeled) Transfers: Sit to/from [facility] Sit to Stand: Supervision Stand pivot transfers: Supervision;Min guard       General transfer comment: very unsteady without AD, requires use of RW for safety  Ambulation/Gait Ambulation/Gait assistance: Min guard;Min assist Gait Distance (Feet): 40 Feet Assistive device: Rolling walker (2 wheeled);None Gait Pattern/deviations: Decreased step length - right;Decreased step length - left;Decreased stride length;Scissoring Gait velocity: decreased   General Gait Details: slow labored unsteady cadence with scissoring of legs when making turns with near fall, excessive BLE internal rotation, required use of RW for safety demonstrating slow labored cadence without loss of balance using walker  Stairs            Wheelchair Mobility    Modified Rankin (Stroke Patients Only)       Balance Overall balance assessment: Needs assistance Sitting-balance support: Feet supported;No upper extremity supported Sitting balance-Leahy Scale: Good Sitting balance - Comments: seated at EOB   Standing balance support: During functional activity;No upper extremity supported Standing balance-Leahy Scale: Poor Standing  balance comment: fair/poor without AD, fair using RW                             Pertinent Vitals/Pain Pain Assessment: No/denies pain    Home Living Family/patient expects to be discharged to::  Private residence Living Arrangements: Spouse/significant other Available Help at Discharge: Family;Available 24 hours/day Type of Home: Mobile home Home Access: Ramped entrance     Home Layout: One level Home Equipment: [place] - 2 wheels;Cane - single point;Bedside commode;Shower seat      Prior Function Level of Independence: Independent with assistive device(s)         Comments: household and short distanced community ambulator using SPC, drives     Hand Dominance   Dominant Hand: Right    Extremity/Trunk Assessment   Upper Extremity Assessment Upper Extremity Assessment: Overall WFL for tasks assessed    Lower Extremity Assessment Lower Extremity Assessment: Generalized weakness    Cervical / Trunk Assessment Cervical / Trunk Assessment: Normal  Communication   Communication: No difficulties  Cognition Arousal/Alertness: Awake/alert Behavior During Therapy: WFL for tasks assessed/performed Overall Cognitive Status: Within Functional Limits for tasks assessed                                        General Comments      Exercises     Assessment/Plan    PT Assessment All further PT needs can be met in the next venue of care  PT Problem List Decreased strength;Decreased activity tolerance;Decreased balance;Decreased mobility       PT Treatment Interventions      PT Goals (Current goals can be found in the Care Plan section)  Acute Rehab PT Goals Patient Stated Goal: return home with family to assist PT Goal Formulation: With patient/family Time For Goal Achievement: 04/15/20 Potential to Achieve Goals: Good    Frequency     Barriers to discharge        Co-evaluation               AM-PAC PT "6 Clicks" Mobility  Outcome Measure Help needed turning from your back to your side while in a flat bed without using bedrails?: None Help needed moving from lying on your back to sitting on the side of a flat bed without using  bedrails?: None Help needed moving to and from a bed to a chair (including a wheelchair)?: A Little Help needed standing up from a chair using your arms (e.g., wheelchair or bedside chair)?: A Little Help needed to walk in hospital room?: A Little Help needed climbing 3-5 steps with a railing? : A Lot 6 Click Score: 19    End of Session   Activity Tolerance: Patient tolerated treatment well;Patient limited by fatigue Patient left: in chair;with call bell/phone within reach;with family/visitor present Nurse Communication: Mobility status PT Visit Diagnosis: Unsteadiness on feet (R26.81);Other abnormalities of gait and mobility (R26.89);Muscle weakness (generalized) (M62.81)    Time: 2876-8115 PT Time Calculation (min) (ACUTE ONLY): 22 min   Charges:   PT Evaluation $PT Eval Moderate Complexity: 1 Mod PT Treatments $Therapeutic Activity: 8-22 mins        12:27 PM, 04/15/20 Lonell Grandchild, MPT Physical Therapist with [facility] 336 [phone] office [phone] mobile phone

## 2020-04-15 NOTE — Discharge Summary (Signed)
[name], is a 60 y.o. male  DOB May 10, 1960  MRN [id number].  Admission date:  04/13/2020  Admitting Physician  Rolla Plate, DO  Discharge Date:  04/15/2020   Primary MD  [name], MD  Recommendations for primary care physician for things to follow:   1)Avoid ibuprofen/Advil/Aleve/Motrin/Goody Powders/Naproxen/BC powders/Meloxicam/Diclofenac/Indomethacin and other Nonsteroidal anti-inflammatory medications as these will make you more likely to bleed and can cause stomach ulcers, can also cause Kidney problems.   2)Repeat CBC and BMP within 1 week with PCP  Admission Diagnosis  Dehydration [E86.0] Hypokalemia [E87.6] Weakness [R53.1]   Discharge Diagnosis  Dehydration [E86.0] Hypokalemia [E87.6] Weakness [R53.1]   Principal Problem:   Hypokalemia Active Problems:   Dehydration   Diarrhea   Hypomagnesemia      Past Medical History:  Diagnosis Date  . Anemia   . Anxiety   . Arthritis   . Cancer of larynx ([place])   . Carotid artery stenosis   . Chronic lower back pain   . Coronary artery disease    a. Diag cath 10/2012 for CP/abnormal nuc -> PCI s/p LAD atherectomy/DES placement 11/05/12.  . Depression   . Dilated aortic root ([place])   . GERD (gastroesophageal reflux disease)   . History of hiatal hernia   . Hypercholesteremia   . Hypertension   . Pneumonia   . S/P percutaneous endoscopic gastrostomy (PEG) tube placement ([place])   . Stroke [facility]) 07/31/2018   Pons    Past Surgical History:  Procedure Laterality Date  . BIOPSY  11/24/2019   Procedure: BIOPSY;  Surgeon: Rush Landmark Telford Nab., MD;  Location: [place];  Service: Gastroenterology;;  . BIOPSY  03/29/2020   Procedure: BIOPSY;  Surgeon: Irving Copas., MD;  Location: [facility];  Service: Gastroenterology;;  . CARDIAC CATHETERIZATION  10/29/2012  . COLONOSCOPY WITH PROPOFOL N/A 11/24/2019    Procedure: COLONOSCOPY WITH PROPOFOL;  Surgeon: Rush Landmark Telford Nab., MD;  Location: [place];  Service: Gastroenterology;  Laterality: N/A;  . CORONARY ANGIOPLASTY WITH STENT PLACEMENT  11/05/2012  . DIRECT LARYNGOSCOPY N/A 01/02/2019   Procedure: MICRO DIRECT LARYNGOSCOPY BIOPSY OF LARYNGEAL MASS;  Surgeon: [name], MD;  [facility];  Service: ENT;  Laterality: N/A;  . ESOPHAGOGASTRODUODENOSCOPY (EGD) WITH PROPOFOL N/A 08/11/2019   Procedure: ATTEMPTED ESOPHAGOGASTRODUODENOSCOPY (EGD) WITH PROPOFOL (UNABLE TO PERFORM PERCUTANEOUS ENDOSCOPIC GASTROSTOMY TUBE REPLACEMENT);  Surgeon: [name], MD;  Location: [facility];  Service: Gastroenterology;  Laterality: N/A;  . ESOPHAGOGASTRODUODENOSCOPY (EGD) WITH PROPOFOL N/A 11/24/2019   Procedure: ESOPHAGOGASTRODUODENOSCOPY (EGD) WITH PROPOFOL;  Surgeon: Rush Landmark Telford Nab., MD;  Location: [place];  Service: Gastroenterology;  Laterality: N/A;  . ESOPHAGOGASTRODUODENOSCOPY (EGD) WITH PROPOFOL N/A 01/26/2020   Procedure: ESOPHAGOGASTRODUODENOSCOPY (EGD) WITH PROPOFOL;  Surgeon: Rush Landmark Telford Nab., MD;  Location: [place];  Service: Gastroenterology;  Laterality: N/A;  . ESOPHAGOGASTRODUODENOSCOPY (EGD) WITH PROPOFOL N/A 03/29/2020   Procedure: ESOPHAGOGASTRODUODENOSCOPY (EGD) WITH PROPOFOL;  Surgeon: Rush Landmark Telford Nab., MD;  Location: [facility];  Service: Gastroenterology;  Laterality: N/A;  NEEDS FLOURO  .  HEMORRHOID SURGERY  ~ 2010  . INSERTION OF MESH N/A 06/02/2015   Procedure: INSERTION OF MESH;  Surgeon: [name], MD;  Location: [facility];  Service: General;  Laterality: N/A;  . IR ANGIO INTRA EXTRACRAN SEL COM CAROTID INNOMINATE BILAT MOD SED  08/02/2018  . IR ANGIO VERTEBRAL SEL VERTEBRAL BILAT MOD SED  08/02/2018  . IR REPLACE G-TUBE SIMPLE WO FLUORO  08/12/2019  . IR REPLC GASTRO/COLONIC TUBE PERCUT W/FLUORO  08/13/2019  . LAPAROSCOPIC INSERTION GASTROSTOMY TUBE N/A 02/24/2019   Procedure: LAPAROSCOPIC INSERTION  GASTROSTOMY TUBE;  Surgeon: [name], MD;  Location: [place];  Service: General;  Laterality: N/A;  . LEFT HEART CATHETERIZATION WITH CORONARY ANGIOGRAM N/A 10/30/2012   Procedure: LEFT HEART CATHETERIZATION WITH CORONARY ANGIOGRAM;  Surgeon: [name], MD;  Location: [place] [facility];  Service: Cardiovascular;  Laterality: N/A;  . MULTIPLE EXTRACTIONS WITH ALVEOLOPLASTY N/A 02/18/2019   Procedure: Extraction of tooth #'s 5-7, 10,11,14,20-29, 31 and 32 with alveoloplasty and maxiillary left buccal exostoses reductions;  Surgeon: [name], [name];  Location: [place];  Service: Oral Surgery;  Laterality: N/A;  . PEG PLACEMENT N/A 01/26/2020   Procedure: PERCUTANEOUS ENDOSCOPIC GASTROSTOMY (PEG) REPLACEMENT;  Surgeon: Irving Copas., MD;  Location: [place];  Service: Gastroenterology;  Laterality: N/A;  . PERCUTANEOUS CORONARY ROTOBLATOR INTERVENTION (PCI-R) N/A 11/05/2012   Procedure: PERCUTANEOUS CORONARY ROTOBLATOR INTERVENTION (PCI-R);  Surgeon: [name], MD;  Location: [facility];  Service: Cardiovascular;  Laterality: N/A;  . SAVORY DILATION N/A 11/24/2019   Procedure: SAVORY DILATION;  Surgeon: Irving Copas., MD;  Location: [place];  Service: Gastroenterology;  Laterality: N/A;  . SAVORY DILATION N/A 01/26/2020   Procedure: SAVORY DILATION;  Surgeon: Irving Copas., MD;  Location: [place];  Service: Gastroenterology;  Laterality: N/A;  . TRACHEOSTOMY TUBE PLACEMENT N/A 02/18/2019   Procedure: Tracheostomy;  Surgeon: [name], MD;  Location: [place] [facility];  Service: ENT;  Laterality: N/A;  . UMBILICAL HERNIA REPAIR N/A 06/02/2015   Procedure: UMBILICAL HERNIORRHAPHY WITH MESH;  Surgeon: [name], MD;  Location[facility];  Service: General;  Laterality: N/A;       HPI  from the history and physical done on the day of admission:     [name]  is a 60 y.o. male, with history of stroke, hypertension, hypercholesterolemia, GERD, depression,  coronary artery disease, cancer of the larynx status post surgery and chemo, and more presents the ED with a chief complaint of diarrhea.  Patient reports significant diarrhea for last 2 weeks.  It occurs every time he eats and eats approximately twice a day.  His last episode was earlier today.  He reports that it appears watery, brownish/white, without blood.  He seen no green or black color.  He reports it is pure water without any formed stool mixed in.  He does use Lomotil and Imodium without relief.  He reports he started those 2 medications 1 week ago.  He denies any fevers, abdominal pain, nausea vomiting.  His last normal meal was 24 hours ago.  He has had difficult time with poor appetite since his last chemo treatment.  He has been on Marinol and that helps.  He also smokes marijuana and reports that that helps as well.  His last chemo was 6 months ago.  Patient reports that he has experienced dizziness at home.  Especially upon standing.  No dizziness at rest.  He complains of dry mouth as well.  Chart reveals that  patient did have unintentional weight loss for which he had a CT of his abdomen approximately 30 days ago.  Was no abdominal/pelvic findings, mass lesions or lymphadenopathy.  He had diffuse bladder wall thickening without a discrete lesion.  Patient does not smoke, does not drink alcohol, does use marijuana, is not vaccinated for COVID but wants to be, and is full code.  In the ED Temp 97.5, heart rate 57, respiratory rate 12, blood pressure 140/93 No leukocytosis of white blood cell count of 8.6, hemoglobin is 12.2 Chemistry panel reveals a slight hyponatremia at 134, hypokalemia 2.3, hypomagnesemia at 1.6, hypochloremia at 89, increased bicarbonate 33 UA is negative EKG shows sinus bradycardia at 57 Admission requested for further management of hypokalemia and hypomagnesemia     Hospital Course:    Brief Summary:-  60 y.o.male,with history of stroke, hypertension,  hypercholesterolemia, GERD, depression, coronary artery disease with prior stents in 2014, history of prior CVA, cancer of the larynx status post surgery and chemo admitted on 04/13/20 with electrolyte derangement in the setting of persistent diarrhea  A/p 1)Hypokalemia/hypophosphatemiaand Hypomagnesemia---due to Gi Losses, replace orally and iv--- normalized after replacement -Repeat BMP with PCP within a week  2)Persistent Diarrhea--- No recent antibiotics c diff neg Stool cx/Gi panel negative Okay to use Lomotil and Imodium over-the-counter prn  3)h/o CVA/CAD-- continue statin and aspirin, metoprolol  4)Hypertension---c/n  metoprolol, and amlodipine   5)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 Delton Coombes  6) orthostatic hypotension--- cortisol level pending -Suspect this is due to dehydration in the setting of persistent diarrhea and poor oral intake -Patient received aggressive IV hydration  7) generalized weakness and deconditioning--PT eval appreciated, patient has a scooter, home health PT requested  8)Anorexia/failure to thrive/insomnia--- Remeron as ordered for appetite and sleep -Apparently Marinol has not been as effective effective for anorexia as desired   Disposition/--- discharge home with wife  Disposition: The patient is from: Home  Anticipated d/c is to: Home   Code Status :  -  Code Status: Full Code   Family Communication:    NA (patient is alert, awake and coherent)   Discharge Condition: Stable,  Follow UP   Follow-up Information    [facility].   Specialty: Home Health Services Why: HHPT Contact information: [address] [place] [address] [phone]        [name], MD. Schedule an appointment as soon as possible for a visit in 1 week(s).   Specialty: Internal Medicine Why: BMP and CBC  recheck within a week Contact information: [address] [place] [address] [phone]        Herminio Commons, MD .   Specialty: Cardiology Contact information: [place] [address] [phone]               Diet and Activity recommendation:  As advised  Discharge Instructions    Discharge Instructions    Call MD for:  difficulty breathing, headache or visual disturbances   Complete by: As directed    Call MD for:  persistant dizziness or light-headedness   Complete by: As directed    Call MD for:  persistant nausea and vomiting   Complete by: As directed    Call MD for:  temperature >100.4   Complete by: As directed    Diet - low sodium heart healthy   Complete by: As directed    Discharge  instructions   Complete by: As directed    1)Avoid ibuprofen/Advil/Aleve/Motrin/Goody Powders/Naproxen/BC powders/Meloxicam/Diclofenac/Indomethacin and other Nonsteroidal anti-inflammatory medications as these will make you more likely to bleed and can cause stomach ulcers, can also cause Kidney problems.   2)Repeat CBC and BMP within 1 week with PCP   Increase activity slowly   Complete by: As directed       Discharge Medications     Allergies as of 04/15/2020      Reactions   Duloxetine Other (See Comments)   Caused depression/ patient is taking   Prednisone Other (See Comments)   Chest pain      Medication List    STOP taking these medications   famotidine 20 MG tablet Commonly known as: PEPCID     TAKE these medications   acetaminophen 325 MG tablet Commonly known as: TYLENOL Take 2 tablets (650 mg total) by mouth every 6 (six) hours as needed for mild pain, fever or headache (or Fever >/= 101).   amLODipine 5 MG tablet Commonly known as: NORVASC Take 5 mg by mouth daily.   aspirin EC 81 MG tablet Take 1 tablet (81 mg total) by mouth daily with breakfast. What changed: when to take this   atorvastatin 40 MG  tablet Commonly known as: LIPITOR Take 80 mg by mouth at bedtime.   diazepam 10 MG tablet Commonly known as: VALIUM Place 10 mg into feeding tube 3 (three) times daily as needed for anxiety or sleep.   diphenoxylate-atropine 2.5-0.025 MG tablet Commonly known as: LOMOTIL Take 2 tablets by mouth in the morning, at noon, and at bedtime.   dronabinol 5 MG capsule Commonly known as: MARINOL Take 1 capsule (5 mg total) by mouth 2 (two) times daily before a meal.   HYDROcodone-acetaminophen 10-325 MG tablet Commonly known as: NORCO Take 1 tablet by mouth every 4 (four) hours as needed.   loperamide 2 MG capsule Commonly known as: IMODIUM Take 2 mg by mouth daily as needed for diarrhea or loose stools.   metoprolol tartrate 25 MG tablet Commonly known as: LOPRESSOR Take 12.5 mg by mouth 2 (two) times daily.   mirtazapine 7.5 MG tablet Commonly known as: REMERON Take 1 tablet (7.5 mg total) by mouth at bedtime.   nitroGLYCERIN 0.4 MG SL tablet Commonly known as: NITROSTAT Place 1 tablet (0.4 mg total) under the tongue every 5 (five) minutes as needed for chest pain (CP or SOB).   omeprazole 40 MG capsule Commonly known as: PRILOSEC Take 1 capsule (40 mg total) by mouth 2 (two) times daily before a meal.   ondansetron 4 MG tablet Commonly known as: ZOFRAN Take 1 tablet (4 mg total) by mouth every 8 (eight) hours as needed for nausea or vomiting.   potassium chloride 10 MEQ tablet Commonly known as: KLOR-CON Take 1 tablet (10 mEq total) by mouth daily.   prochlorperazine 10 MG tablet Commonly known as: COMPAZINE TAKE 1 TABLET(10 MG) BY MOUTH EVERY 6 HOURS AS NEEDED FOR NAUSEA What changed: See the new instructions.   traMADol 50 MG tablet Commonly known as: ULTRAM Take 50-100 mg by mouth 4 (four) times daily as needed for moderate pain (pain.).      Major procedures and Radiology Reports - PLEASE review detailed and final reports for all details, in brief -   No  results found.  Micro Results   Recent Results (from the past 240 hour(s))  C Difficile Quick Screen w PCR reflex     Status: None  Collection Time: 04/14/20 10:30 AM   Specimen: STOOL  Result Value Ref Range Status   C Diff antigen NEGATIVE NEGATIVE Final   C Diff toxin NEGATIVE NEGATIVE Final   C Diff interpretation No C. difficile detected.  Final    Comment: NEGATIVE Performed at [facility], [address], [place], [place] [address]   Gastrointestinal Panel by PCR , Stool     Status: None   Collection Time: 04/14/20 10:30 AM   Specimen: STOOL  Result Value Ref Range Status   Campylobacter species NOT DETECTED NOT DETECTED Final   Plesimonas shigelloides NOT DETECTED NOT DETECTED Final   Salmonella species NOT DETECTED NOT DETECTED Final   Yersinia enterocolitica NOT DETECTED NOT DETECTED Final   Vibrio species NOT DETECTED NOT DETECTED Final   Vibrio cholerae NOT DETECTED NOT DETECTED Final   Enteroaggregative E coli (EAEC) NOT DETECTED NOT DETECTED Final   Enteropathogenic E coli (EPEC) NOT DETECTED NOT DETECTED Final   Enterotoxigenic E coli (ETEC) NOT DETECTED NOT DETECTED Final   Shiga like toxin producing E coli (STEC) NOT DETECTED NOT DETECTED Final   Shigella/Enteroinvasive E coli (EIEC) NOT DETECTED NOT DETECTED Final   Cryptosporidium NOT DETECTED NOT DETECTED Final   Cyclospora cayetanensis NOT DETECTED NOT DETECTED Final   Entamoeba histolytica NOT DETECTED NOT DETECTED Final   Giardia lamblia NOT DETECTED NOT DETECTED Final   Adenovirus F40/41 NOT DETECTED NOT DETECTED Final   Astrovirus NOT DETECTED NOT DETECTED Final   Norovirus GI/GII NOT DETECTED NOT DETECTED Final   Rotavirus A NOT DETECTED NOT DETECTED Final   Sapovirus (I, II, IV, and V) NOT DETECTED NOT DETECTED Final    Comment: Performed at [facility], [address] [place], [place] [address]   Today   [place] today has no new complaints -Eating and drinking  better, no emesis -Only 2 BMs in the last 12 hours, and somewhat mushy No fever  Or chills  -Less dizzy, gait is less unsteady   Patient has been seen and examined prior to discharge   Objective   Blood pressure (!) 142/91, pulse 74, temperature (!) 97.1 F (36.2 C), resp. rate 16, height 6' (1.829 m), weight 54.9 kg, SpO2 95 %.   Intake/Output Summary (Last 24 hours) at 04/15/2020 1154 Last data filed at 04/15/2020 0900 Gross per 24 hour  Intake 2080 ml  Output 1000 ml  Net 1080 ml   Exam Gen:- Awake Alert, no acute distress, cachectic/Frail/chronically ill-appearing HEENT:- .AT, No sclera icterus Neck-Supple Neck,No JVD,.  Lungs-  CTAB , good air movement bilaterally  CV- S1, S2 normal, regular Abd-  +ve B.Sounds, Abd Soft, No tenderness,    Extremity/Skin:- No  edema,   good pulses Psych-affect is appropriate, oriented x3 Neuro-generalized weakness, no new focal deficits, no tremors    Data Review   CBC w Diff:  Lab Results  Component Value Date   WBC 6.2 04/14/2020   HGB 10.3 (L) 04/14/2020   HCT 31.2 (L) 04/14/2020   HCT 25.0 (L) 04/02/2019   PLT 235 04/14/2020   LYMPHOPCT 14 04/13/2020   MONOPCT 7 04/13/2020   EOSPCT 6 04/13/2020   BASOPCT 1 04/13/2020   CMP:  Lab Results  Component Value Date   NA 136 04/15/2020   K 5.0 04/15/2020   CL 102 04/15/2020   CO2 27 04/15/2020   BUN 5 (L) 04/15/2020   BUN 13 12/24/2018   CREATININE 0.65 04/15/2020   CREATININE 1.23  10/28/2012   PROT 6.3 (L) 03/10/2020   ALBUMIN 2.1 (L) 04/14/2020   BILITOT 0.6 03/10/2020   ALKPHOS 140 (H) 03/10/2020   AST 18 03/10/2020   ALT 12 03/10/2020   Total Discharge time is about 33 minutes  Roxan Hockey M.D on 04/15/2020 at 11:54 AM  Go to [email or web address] -  for contact info  [facility] - Office  [phone]

## 2020-04-26 ENCOUNTER — Telehealth: Payer: Self-pay | Admitting: Adult Health

## 2020-04-26 NOTE — Telephone Encounter (Signed)
Reviewed recent hospitalization - no report regarding these symptoms -could have been a seizure type activity in setting of electrolyte disturbance.  Highly encouraged continued monitoring electrolytes by PCP.  If he has additional episodes without specific cause, would recommend further evaluation at that time

## 2020-04-26 NOTE — Telephone Encounter (Signed)
I spoke to wife.  I relayed that per JM/NP that in light of his episodes most likely relayed to electrolyte imbalance, if they continue and not related to other causes to let us know.  Bp is still issue.  She states that pcp regulates his Bp meds.  She needed metoprolol called in.  I relayed that would touch base with pcp re: his postural hypotension if needing med management.  I encouraged hydration. She verbalized understanding and will call us back as needed.

## 2020-04-26 NOTE — Telephone Encounter (Signed)
Pt's wife [name] on [place] called stating the pt has been having dizzy spells and when he get's hot he will get stiff as a board, eyes roll back to the back of his head. Please advise.

## 2020-04-26 NOTE — Telephone Encounter (Signed)
I spoke to wife of pt.  He has had 3 episodes since from 2 wks ago.  (happen in middle of night. Most of the time),  He will get stiff, eyes roll back , non responsive.  Lasting 2 min.  She will use cold water to arouse.  No incontinence.  Still with dizziness when gets up suddenly.  He was seen in  [facility] dehydration/ low potasium, low magnesium 04-15-2020.  Since been home no episodes.  Relayed could be relayed to this. We have not see for seizures.  Has seen pcp last week labs looked good.  I relayed that if needs seen would need to be see by pcp, referred to [place].  She states that when asked about these episodes with pcp, referred to [place].  Thoughts.  He did have one prior to last visit with [place], but did not mention.

## 2020-04-29 ENCOUNTER — Telehealth: Payer: Self-pay

## 2020-04-29 NOTE — Telephone Encounter (Signed)
Spoke with patient's wife [name] and scheduled an in-person Palliative Consult for 05/26/20 @ 12:30PM  COVID screening was negative. They have two small dogs in the home, will put away if NP prefers.Patient lives with wife.  Consent obtained; updated Outlook/Netsmart/Team List and Epic.  Family is aware they may be receiving a call from NP the day before or day of to confirm appointment.

## 2020-04-30 ENCOUNTER — Other Ambulatory Visit: Payer: Self-pay

## 2020-04-30 ENCOUNTER — Emergency Department (HOSPITAL_COMMUNITY): Payer: 59

## 2020-04-30 ENCOUNTER — Observation Stay (HOSPITAL_COMMUNITY)
Admission: EM | Admit: 2020-04-30 | Discharge: 2020-05-02 | Disposition: A | Discharge status: Home Health | Payer: 59 | Attending: Family Medicine | Admitting: Family Medicine

## 2020-04-30 ENCOUNTER — Encounter (HOSPITAL_COMMUNITY): Payer: Self-pay | Admitting: *Deleted

## 2020-04-30 DIAGNOSIS — I10 Essential (primary) hypertension: Secondary | ICD-10-CM | POA: Insufficient documentation

## 2020-04-30 DIAGNOSIS — Z681 Body mass index (BMI) 19 or less, adult: Secondary | ICD-10-CM

## 2020-04-30 DIAGNOSIS — I951 Orthostatic hypotension: Secondary | ICD-10-CM | POA: Diagnosis not present

## 2020-04-30 DIAGNOSIS — Z8521 Personal history of malignant neoplasm of larynx: Secondary | ICD-10-CM | POA: Diagnosis not present

## 2020-04-30 DIAGNOSIS — Z20822 Contact with and (suspected) exposure to covid-19: Secondary | ICD-10-CM | POA: Diagnosis not present

## 2020-04-30 DIAGNOSIS — K219 Gastro-esophageal reflux disease without esophagitis: Secondary | ICD-10-CM | POA: Diagnosis present

## 2020-04-30 DIAGNOSIS — C7801 Secondary malignant neoplasm of right lung: Secondary | ICD-10-CM | POA: Diagnosis present

## 2020-04-30 DIAGNOSIS — E440 Moderate protein-calorie malnutrition: Secondary | ICD-10-CM | POA: Diagnosis not present

## 2020-04-30 DIAGNOSIS — E876 Hypokalemia: Secondary | ICD-10-CM | POA: Diagnosis not present

## 2020-04-30 DIAGNOSIS — Z8249 Family history of ischemic heart disease and other diseases of the circulatory system: Secondary | ICD-10-CM

## 2020-04-30 DIAGNOSIS — Z833 Family history of diabetes mellitus: Secondary | ICD-10-CM

## 2020-04-30 DIAGNOSIS — Z7982 Long term (current) use of aspirin: Secondary | ICD-10-CM | POA: Diagnosis not present

## 2020-04-30 DIAGNOSIS — Z79899 Other long term (current) drug therapy: Secondary | ICD-10-CM | POA: Insufficient documentation

## 2020-04-30 DIAGNOSIS — E860 Dehydration: Secondary | ICD-10-CM | POA: Diagnosis present

## 2020-04-30 DIAGNOSIS — Z8673 Personal history of transient ischemic attack (TIA), and cerebral infarction without residual deficits: Secondary | ICD-10-CM

## 2020-04-30 DIAGNOSIS — M1990 Unspecified osteoarthritis, unspecified site: Secondary | ICD-10-CM | POA: Diagnosis present

## 2020-04-30 DIAGNOSIS — R531 Weakness: Secondary | ICD-10-CM

## 2020-04-30 DIAGNOSIS — I2510 Atherosclerotic heart disease of native coronary artery without angina pectoris: Secondary | ICD-10-CM | POA: Diagnosis not present

## 2020-04-30 DIAGNOSIS — Z803 Family history of malignant neoplasm of breast: Secondary | ICD-10-CM

## 2020-04-30 DIAGNOSIS — R55 Syncope and collapse: Secondary | ICD-10-CM | POA: Diagnosis present

## 2020-04-30 DIAGNOSIS — E878 Other disorders of electrolyte and fluid balance, not elsewhere classified: Secondary | ICD-10-CM | POA: Diagnosis present

## 2020-04-30 DIAGNOSIS — G8929 Other chronic pain: Secondary | ICD-10-CM | POA: Diagnosis present

## 2020-04-30 DIAGNOSIS — Z87891 Personal history of nicotine dependence: Secondary | ICD-10-CM | POA: Diagnosis not present

## 2020-04-30 DIAGNOSIS — E871 Hypo-osmolality and hyponatremia: Secondary | ICD-10-CM | POA: Diagnosis not present

## 2020-04-30 DIAGNOSIS — I6523 Occlusion and stenosis of bilateral carotid arteries: Secondary | ICD-10-CM | POA: Diagnosis present

## 2020-04-30 DIAGNOSIS — E7800 Pure hypercholesterolemia, unspecified: Secondary | ICD-10-CM | POA: Diagnosis present

## 2020-04-30 DIAGNOSIS — E785 Hyperlipidemia, unspecified: Secondary | ICD-10-CM | POA: Diagnosis present

## 2020-04-30 DIAGNOSIS — M545 Low back pain, unspecified: Secondary | ICD-10-CM | POA: Diagnosis present

## 2020-04-30 DIAGNOSIS — Z888 Allergy status to other drugs, medicaments and biological substances status: Secondary | ICD-10-CM

## 2020-04-30 DIAGNOSIS — Z800 Family history of malignant neoplasm of digestive organs: Secondary | ICD-10-CM

## 2020-04-30 LAB — COMPREHENSIVE METABOLIC PANEL
ALT: 20 U/L (ref 0–44)
AST: 49 U/L — ABNORMAL HIGH (ref 15–41)
Albumin: 2.6 g/dL — ABNORMAL LOW (ref 3.5–5.0)
Alkaline Phosphatase: 733 U/L — ABNORMAL HIGH (ref 38–126)
Anion gap: 8 (ref 5–15)
BUN: 12 mg/dL (ref 6–20)
CO2: 25 mmol/L (ref 22–32)
Calcium: 8.1 mg/dL — ABNORMAL LOW (ref 8.9–10.3)
Chloride: 97 mmol/L — ABNORMAL LOW (ref 98–111)
Creatinine, Ser: 0.9 mg/dL (ref 0.61–1.24)
GFR, Estimated: >60 mL/min (ref >60)
Glucose, Bld: 105 mg/dL — ABNORMAL HIGH (ref 70–99)
Potassium: 3.3 mmol/L — ABNORMAL LOW (ref 3.5–5.1)
Sodium: 130 mmol/L — ABNORMAL LOW (ref 135–145)
Total Bilirubin: 0.6 mg/dL (ref 0.3–1.2)
Total Protein: 6.6 g/dL (ref 6.5–8.1)

## 2020-04-30 LAB — CBC WITH DIFFERENTIAL/PLATELET
Abs Immature Granulocytes: 0.02 10*3/uL (ref 0.00–0.07)
Basophils Absolute: 0 10*3/uL (ref 0.0–0.1)
Basophils Relative: 1 %
Eosinophils Absolute: 0.3 10*3/uL (ref 0.0–0.5)
Eosinophils Relative: 5 %
HCT: 36.6 % — ABNORMAL LOW (ref 39.0–52.0)
Hemoglobin: 11.7 g/dL — ABNORMAL LOW (ref 13.0–17.0)
Immature Granulocytes: 0 %
Lymphocytes Relative: 28 %
Lymphs Abs: 1.5 10*3/uL (ref 0.7–4.0)
MCH: 32 pg (ref 26.0–34.0)
MCHC: 32 g/dL (ref 30.0–36.0)
MCV: 100 fL (ref 80.0–100.0)
Monocytes Absolute: 0.4 10*3/uL (ref 0.1–1.0)
Monocytes Relative: 7 %
Neutro Abs: 3 10*3/uL (ref 1.7–7.7)
Neutrophils Relative %: 59 %
Platelets: 356 10*3/uL (ref 150–400)
RBC: 3.66 MIL/uL — ABNORMAL LOW (ref 4.22–5.81)
RDW: 14.1 % (ref 11.5–15.5)
WBC: 5.1 10*3/uL (ref 4.0–10.5)
nRBC: 0 % (ref 0.0–0.2)

## 2020-04-30 LAB — MAGNESIUM: Magnesium: 1.8 mg/dL (ref 1.7–2.4)

## 2020-04-30 MED ORDER — MIRTAZAPINE 15 MG PO TABS
7.5000 mg | ORAL_TABLET | Freq: Every day | ORAL | Status: DC
  Administered 2020-05-01: 7.5 mg via ORAL
  Filled 2020-04-30: qty 1

## 2020-04-30 MED ORDER — ONDANSETRON HCL 4 MG/2ML IJ SOLN: 4.0000 mg | Freq: Four times a day (QID) | INTRAMUSCULAR | Status: DC | PRN

## 2020-04-30 MED ORDER — SODIUM CHLORIDE 0.9% FLUSH
3.0000 mL | Freq: Two times a day (BID) | INTRAVENOUS | Status: DC
  Administered 2020-05-01 – 2020-05-02 (×4): 3 mL via INTRAVENOUS

## 2020-04-30 MED ORDER — ENSURE ENLIVE PO LIQD
237.0000 mL | Freq: Two times a day (BID) | ORAL | Status: DC
  Filled 2020-04-30 (×7): qty 237

## 2020-04-30 MED ORDER — POTASSIUM CHLORIDE 20 MEQ PO PACK: 40.0000 meq | PACK | Freq: Once | ORAL | Status: DC

## 2020-04-30 MED ORDER — ONDANSETRON HCL 4 MG PO TABS: 4.0000 mg | ORAL_TABLET | Freq: Four times a day (QID) | ORAL | Status: DC | PRN

## 2020-04-30 MED ORDER — ASPIRIN EC 81 MG PO TBEC
81.0000 mg | DELAYED_RELEASE_TABLET | Freq: Every day | ORAL | Status: DC
  Administered 2020-05-01 – 2020-05-02 (×2): 81 mg via ORAL
  Filled 2020-04-30 (×2): qty 1

## 2020-04-30 MED ORDER — POTASSIUM CHLORIDE 20 MEQ PO PACK
40.0000 meq | PACK | Freq: Once | ORAL | Status: AC
  Administered 2020-04-30: 40 meq via ORAL
  Filled 2020-04-30: qty 2

## 2020-04-30 MED ORDER — SODIUM CHLORIDE 0.9 % IV BOLUS
1000.0000 mL | Freq: Once | INTRAVENOUS | Status: AC
  Administered 2020-04-30: 1000 mL via INTRAVENOUS

## 2020-04-30 MED ORDER — IOHEXOL 350 MG/ML SOLN
75.0000 mL | Freq: Once | INTRAVENOUS | Status: AC | PRN
  Administered 2020-04-30: 75 mL via INTRAVENOUS

## 2020-04-30 MED ORDER — HYDROCODONE-ACETAMINOPHEN 10-325 MG PO TABS
1.0000 | ORAL_TABLET | ORAL | Status: DC | PRN
  Administered 2020-05-01 – 2020-05-02 (×4): 1 via ORAL
  Filled 2020-04-30 (×4): qty 1

## 2020-04-30 MED ORDER — ACETAMINOPHEN 325 MG PO TABS: 650.0000 mg | ORAL_TABLET | Freq: Four times a day (QID) | ORAL | Status: DC | PRN

## 2020-04-30 MED ORDER — HEPARIN SODIUM (PORCINE) 5000 UNIT/ML IJ SOLN
5000.0000 [IU] | Freq: Three times a day (TID) | INTRAMUSCULAR | Status: DC
  Administered 2020-05-01 – 2020-05-02 (×2): 5000 [IU] via SUBCUTANEOUS
  Filled 2020-04-30 (×4): qty 1

## 2020-04-30 MED ORDER — MORPHINE SULFATE (PF) 2 MG/ML IV SOLN
2.0000 mg | INTRAVENOUS | Status: DC | PRN
  Administered 2020-04-30 – 2020-05-01 (×3): 2 mg via INTRAVENOUS
  Filled 2020-04-30 (×3): qty 1

## 2020-04-30 MED ORDER — METOPROLOL TARTRATE 25 MG PO TABS
12.5000 mg | ORAL_TABLET | Freq: Two times a day (BID) | ORAL | Status: DC
  Administered 2020-05-01 – 2020-05-02 (×4): 12.5 mg via ORAL
  Filled 2020-04-30 (×3): qty 1

## 2020-04-30 MED ORDER — ATORVASTATIN CALCIUM 40 MG PO TABS
80.0000 mg | ORAL_TABLET | Freq: Every day | ORAL | Status: DC
  Administered 2020-05-01 (×2): 80 mg via ORAL
  Filled 2020-04-30: qty 2

## 2020-04-30 MED ORDER — ACETAMINOPHEN 325 MG PO TABS
650.0000 mg | ORAL_TABLET | Freq: Once | ORAL | Status: AC
  Administered 2020-04-30: 650 mg via ORAL
  Filled 2020-04-30: qty 2

## 2020-04-30 MED ORDER — DRONABINOL 5 MG PO CAPS
5.0000 mg | ORAL_CAPSULE | Freq: Two times a day (BID) | ORAL | Status: DC
  Administered 2020-05-01 – 2020-05-02 (×3): 5 mg via ORAL
  Filled 2020-04-30 (×3): qty 1

## 2020-04-30 MED ORDER — SODIUM CHLORIDE 0.9 % IV SOLN: INTRAVENOUS | Status: AC

## 2020-04-30 MED ORDER — ACETAMINOPHEN 650 MG RE SUPP: 650.0000 mg | Freq: Four times a day (QID) | RECTAL | Status: DC | PRN

## 2020-04-30 NOTE — ED Provider Notes (Signed)
[facility] EMERGENCY DEPARTMENT Provider Note   CSN: [id number] Arrival date & time: 04/30/20  1634     History No chief complaint on file.   [name] is a 60 y.o. male with past medical history of CVA, HTN, HLD, CAD, laryngeal cancer s/p surgery and chemotherapy, and recent hospitalization for electrolyte derangement in context of diarrhea who presents the ED with complaints of weakness.  I reviewed his medical record and his wife [name] called his neurology office regarding his episodes of dizzy spells.  Evidently he has had multiple episodes of becoming stiff with eyes rolled back, nonresponsive.  Each episode lasts approximately 2 minutes duration.  His neurology NP suspected that perhaps these were seizures in context of electrolyte disturbance.  On my examination, patient is accompanied by his wife was at bedside.  They state that shortly after discharge from the hospital, he had an episode of syncope versus seizure.  He was sitting in a Rollator chair, stated that he was having worsening headache symptoms and felt "fuzzy headed", then lost consciousness.  His wife who observed this event states that he became "stiff as a board" and that his eyes rolled back into his head.  She states that he was nonresponsive until she wheeled him over to the sink and threw water on his face and he eventually began to respond.  She states that he would be unconscious for 1 to 2 minutes.  Since this initial episode, he has had 4-5 additional episodes.  Immediately prior to coming to the ED, he had been sitting on his bed comfortably with his wife in another room.  She had instructed him to stay on the bed as he had been falling and having these episodes.  However, he went to the kitchen to try and grab something to eat and shortly after standing up from a lying position became fuzzy headed with worsening headache and nausea symptoms before he developed another episode.  Wife found him on the ground.  He  denies any history of seizure disorder.  Wife states this happened once a couple of years ago after he was discharged from hospital subsequent to MI, but he told EMS that he suspected he was dehydrated and did not come to the ED for evaluation.  He stopped consuming alcohol a couple of years ago after he was diagnosed with cancer.  No history of withdrawal seizures.  He also denies any cocaine or other substance use disorder.  He does take "marijuana pills" to help with his appetite.  He is followed by [facility].  HPI     Past Medical History:  Diagnosis Date  . Anemia   . Anxiety   . Arthritis   . Cancer of larynx ([place])   . Carotid artery stenosis   . Chronic lower back pain   . Coronary artery disease    a. Diag cath 10/2012 for CP/abnormal nuc -> PCI s/p LAD atherectomy/DES placement 11/05/12.  . Depression   . Dilated aortic root ([place])   . GERD (gastroesophageal reflux disease)   . History of hiatal hernia   . Hypercholesteremia   . Hypertension   . Pneumonia   . S/P percutaneous endoscopic gastrostomy (PEG) tube placement ([place])   . Stroke [facility]) 07/31/2018   Pons    Patient Active Problem List   Diagnosis Date Noted  . Hypokalemia 04/13/2020  . Diarrhea 04/13/2020  . Hypomagnesemia 04/13/2020  . PEG (percutaneous endoscopic gastrostomy) adjustment/replacement/removal ([place]) 08/11/2019  . PEG  tube malfunction ([place]) 08/10/2019  . HCAP (healthcare-associated pneumonia) 04/10/2019  . Febrile illness, acute 04/08/2019  . Malnutrition of moderate degree 04/03/2019  . GI bleed 04/01/2019  . Dehydration 03/28/2019  . RLL pneumonia 03/17/2019  . Hx of tracheostomy 02/18/2019  . Head and neck cancer ([place]) 02/18/2019  . 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 Stroke ([place]) 07/31/2018  . Rotator cuff syndrome of right shoulder 02/20/2013  . Bradycardia 11/06/2012  . Coronary artery disease   . Abnormal finding on cardiovascular stress test  09/25/2012  . Hypertension 09/03/2012  . Chest pain 09/03/2012  . Alcohol abuse, daily use 09/03/2012  . Hyperlipidemia 09/03/2012  . Hyponatremia 09/03/2012    Past Surgical History:  Procedure Laterality Date  . BIOPSY  11/24/2019   Procedure: BIOPSY;  Surgeon: Rush Landmark Telford Nab., MD;  Location: [place];  Service: Gastroenterology;;  . BIOPSY  03/29/2020   Procedure: BIOPSY;  Surgeon: Irving Copas., MD;  Location: [facility];  Service: Gastroenterology;;  . CARDIAC CATHETERIZATION  10/29/2012  . COLONOSCOPY WITH PROPOFOL N/A 11/24/2019   Procedure: COLONOSCOPY WITH PROPOFOL;  Surgeon: Rush Landmark Telford Nab., MD;  Location: [place];  Service: Gastroenterology;  Laterality: N/A;  . CORONARY ANGIOPLASTY WITH STENT PLACEMENT  11/05/2012  . DIRECT LARYNGOSCOPY N/A 01/02/2019   Procedure: MICRO DIRECT LARYNGOSCOPY BIOPSY OF LARYNGEAL MASS;  Surgeon: [name], MD;  Location: [facility];  Service: ENT;  Laterality: N/A;  . ESOPHAGOGASTRODUODENOSCOPY (EGD) WITH PROPOFOL N/A 08/11/2019   Procedure: ATTEMPTED ESOPHAGOGASTRODUODENOSCOPY (EGD) WITH PROPOFOL (UNABLE TO PERFORM PERCUTANEOUS ENDOSCOPIC GASTROSTOMY TUBE REPLACEMENT);  Surgeon: [name], MD;  Location: [facility];  Service: Gastroenterology;  Laterality: N/A;  . ESOPHAGOGASTRODUODENOSCOPY (EGD) WITH PROPOFOL N/A 11/24/2019   Procedure: ESOPHAGOGASTRODUODENOSCOPY (EGD) WITH PROPOFOL;  Surgeon: Rush Landmark Telford Nab., MD;  Location: [place];  Service: Gastroenterology;  Laterality: N/A;  . ESOPHAGOGASTRODUODENOSCOPY (EGD) WITH PROPOFOL N/A 01/26/2020   Procedure: ESOPHAGOGASTRODUODENOSCOPY (EGD) WITH PROPOFOL;  Surgeon: Rush Landmark Telford Nab., MD;  Location: [place];  Service: Gastroenterology;  Laterality: N/A;  . ESOPHAGOGASTRODUODENOSCOPY (EGD) WITH PROPOFOL N/A 03/29/2020   Procedure: ESOPHAGOGASTRODUODENOSCOPY (EGD) WITH PROPOFOL;  Surgeon: Rush Landmark Telford Nab., MD;  Location: [facility];  Service:  Gastroenterology;  Laterality: N/A;  NEEDS FLOURO  . HEMORRHOID SURGERY  ~ 2010  . INSERTION OF MESH N/A 06/02/2015   Procedure: INSERTION OF MESH;  Surgeon: [name], MD;  Location: [facility];  Service: General;  Laterality: N/A;  . IR ANGIO INTRA EXTRACRAN SEL COM CAROTID INNOMINATE BILAT MOD SED  08/02/2018  . IR ANGIO VERTEBRAL SEL VERTEBRAL BILAT MOD SED  08/02/2018  . IR REPLACE G-TUBE SIMPLE WO FLUORO  08/12/2019  . IR REPLC GASTRO/COLONIC TUBE PERCUT W/FLUORO  08/13/2019  . LAPAROSCOPIC INSERTION GASTROSTOMY TUBE N/A 02/24/2019   Procedure: LAPAROSCOPIC INSERTION GASTROSTOMY TUBE;  Surgeon: [name], MD;  Location: [place];  Service: General;  Laterality: N/A;  . LEFT HEART CATHETERIZATION WITH CORONARY ANGIOGRAM N/A 10/30/2012   Procedure: LEFT HEART CATHETERIZATION WITH CORONARY ANGIOGRAM;  Surgeon: [name], MD;  Location: [place] [facility];  Service: Cardiovascular;  Laterality: N/A;  . MULTIPLE EXTRACTIONS WITH ALVEOLOPLASTY N/A 02/18/2019   Procedure: Extraction of tooth #'s 5-7, 10,11,14,20-29, 31 and 32 with alveoloplasty and maxiillary left buccal exostoses reductions;  Surgeon: [name], [name];  Location: [place];  Service: Oral Surgery;  Laterality: N/A;  . PEG PLACEMENT N/A 01/26/2020   Procedure: PERCUTANEOUS ENDOSCOPIC GASTROSTOMY (PEG) REPLACEMENT;  Surgeon: Irving Copas., MD;  Location: [place];  Service: Gastroenterology;  Laterality: N/A;  . PERCUTANEOUS CORONARY ROTOBLATOR INTERVENTION (PCI-R) N/A 11/05/2012   Procedure: PERCUTANEOUS CORONARY ROTOBLATOR INTERVENTION (PCI-R);  Surgeon: [name], MD;  Location: [facility];  Service: Cardiovascular;  Laterality: N/A;  . SAVORY DILATION N/A 11/24/2019   Procedure: SAVORY DILATION;  Surgeon: Irving Copas., MD;  Location: [place];  Service: Gastroenterology;  Laterality: N/A;  . SAVORY DILATION N/A 01/26/2020   Procedure: SAVORY DILATION;  Surgeon: Irving Copas., MD;   Location: [place];  Service: Gastroenterology;  Laterality: N/A;  . TRACHEOSTOMY TUBE PLACEMENT N/A 02/18/2019   Procedure: Tracheostomy;  Surgeon: [name], MD;  Location: [place];  Service: ENT;  Laterality: N/A;  . UMBILICAL HERNIA REPAIR N/A 06/02/2015   Procedure: UMBILICAL HERNIORRHAPHY WITH MESH;  Surgeon: [name], MD;  Location: [facility];  Service: General;  Laterality: N/A;       Family History  Problem Relation Age of Onset  . Hypertension Mother   . Heart disease Father   . Hypertension Father   . Heart disease Sister   . Hypertension Maternal Grandmother   . Hypertension Maternal Grandfather   . Hypertension Paternal Grandmother   . Hypertension Paternal Grandfather   . Pancreatic cancer Maternal Uncle   . Breast cancer Maternal Aunt   . Breast cancer Maternal Aunt   . Diabetes Paternal Uncle   . Diabetes Paternal Aunt     Social History   Tobacco Use  . Smoking status: Former Smoker    Packs/day: 3.00    Years: 32.00    Pack years: 96.00    Types: Cigarettes    Start date: 08/29/1977    Quit date: 11/29/2006    Years since quitting: 13.4  . Smokeless tobacco: Former Systems developer    Types: Chew    Quit date: 2003  . Tobacco comment: 11/05/2012 "chewed tobacco when I play ball; haven't chewed since age 74"  Vaping Use  . Vaping Use: Never used  Substance Use Topics  . Alcohol use: Yes    Alcohol/week: 12.0 standard drinks    Types: 12 Cans of beer per week    Comment: per wife [name] 6-7/week now as of 02/24/19  . Drug use: Not Currently    Types: Marijuana    Comment: Last use was on - denies on 04/08/19    Home Medications Prior to Admission medications   Medication Sig Start Date End Date Taking? Authorizing Provider  acetaminophen (TYLENOL) 325 MG tablet Take 2 tablets (650 mg total) by mouth every 6 (six) hours as needed for mild pain, fever or headache (or Fever >/= 101). Patient taking differently: Take 500 mg by mouth every 6 (six) hours as needed  for mild pain, fever or headache (or Fever >/= 101). 04/14/19  Yes Emokpae, Courage, MD  amLODipine (NORVASC) 5 MG tablet Take 5 mg by mouth daily.   Yes [provider]  aspirin EC 81 MG tablet Take 1 tablet (81 mg total) by mouth daily with breakfast. 04/15/20  Yes Emokpae, Courage, MD  atorvastatin (LIPITOR) 40 MG tablet Take 80 mg by mouth at bedtime. 01/17/20  Yes [provider]  diphenoxylate-atropine (LOMOTIL) 2.5-0.025 MG tablet Take 2 tablets by mouth in the morning, at noon, and at bedtime. 02/17/20  Yes [name], MD  dronabinol (MARINOL) 5 MG capsule Take 1 capsule (5 mg total) by mouth 2 (two) times daily before a meal. 04/15/20  Yes Emokpae, Courage, MD  HYDROcodone-acetaminophen (NORCO) 10-325 MG tablet Take  1 tablet by mouth every 4 (four) hours as needed. 06/02/15  Yes [name], MD  loperamide (IMODIUM) 2 MG capsule Take 2 mg by mouth daily as needed for diarrhea or loose stools.   Yes [provider]  metoprolol tartrate (LOPRESSOR) 25 MG tablet Take 12.5 mg by mouth 2 (two) times daily.   Yes [provider]  mirtazapine (REMERON) 7.5 MG tablet Take 1 tablet (7.5 mg total) by mouth at bedtime. 04/15/20 04/15/21 Yes Emokpae, Courage, MD  nitroGLYCERIN (NITROSTAT) 0.4 MG SL tablet Place 1 tablet (0.4 mg total) under the tongue every 5 (five) minutes as needed for chest pain (CP or SOB). 07/27/14  Yes Herminio Commons, MD  potassium chloride SA (KLOR-CON) 20 MEQ tablet Take 20 mEq by mouth daily. 04/22/20  Yes [provider]  prochlorperazine (COMPAZINE) 10 MG tablet TAKE 1 TABLET(10 MG) BY MOUTH EVERY 6 HOURS AS NEEDED FOR NAUSEA Patient taking differently: Take 10 mg by mouth every 6 (six) hours as needed for nausea. 08/19/19  Yes Lockamy, Randi L, NP-C  traMADol (ULTRAM) 50 MG tablet Take 50-100 mg by mouth 4 (four) times daily as needed for moderate pain (pain.). 01/29/19  Yes [provider]  diazepam (VALIUM) 10 MG  tablet Place 10 mg into feeding tube 3 (three) times daily as needed for anxiety or sleep.  Patient not taking: Reported on 04/13/2020 05/28/19   [provider]  omeprazole (PRILOSEC) 40 MG capsule Take 1 capsule (40 mg total) by mouth 2 (two) times daily before a meal. Patient not taking: No sig reported 04/15/20   Roxan Hockey, MD  ondansetron (ZOFRAN) 4 MG tablet Take 1 tablet (4 mg total) by mouth every 8 (eight) hours as needed for nausea or vomiting. Patient not taking: No sig reported 02/22/20   Horton, Kristie M, DO  potassium chloride (KLOR-CON) 10 MEQ tablet Take 1 tablet (10 mEq total) by mouth daily. Patient not taking: Reported on 04/30/2020 02/22/20   Horton, Alvin Critchley, DO    Allergies    Duloxetine and Prednisone  Review of Systems   Review of Systems  All other systems reviewed and are negative.   Physical Exam Updated Vital Signs BP (!) 167/88   Pulse (!) 52   Temp 97.7 F (36.5 C) (Oral)   Resp 14   SpO2 100%   Physical Exam Vitals and nursing note reviewed. Exam conducted with a chaperone present.  Constitutional:      General: He is not in acute distress.    Appearance: He is not toxic-appearing.     Comments: Appears older than stated age.  HENT:     Head: Normocephalic and atraumatic.  Eyes:     General: No scleral icterus.    Extraocular Movements: Extraocular movements intact.     Conjunctiva/sclera: Conjunctivae normal.     Pupils: Pupils are equal, round, and reactive to light.     Comments: No nystagmus.  Neck:     Comments: No meningismus. Cardiovascular:     Rate and Rhythm: Normal rate.     Pulses: Normal pulses.     Comments: Diminished heart sounds. Pulmonary:     Effort: Pulmonary effort is normal. No respiratory distress.     Breath sounds: Normal breath sounds.  Musculoskeletal:        General: Normal range of motion.     Cervical back: Normal range of motion. No rigidity.  Skin:    General: Skin is dry.  Neurological:  General: No focal deficit present.     Mental Status: He is alert and oriented to person, place, and time.     GCS: GCS eye subscore is 4. GCS verbal subscore is 5. GCS motor subscore is 6.     Cranial Nerves: No cranial nerve deficit.     Sensory: No sensory deficit.     Motor: No weakness.     Coordination: Coordination normal.     Comments: Moves all extremities with strength intact against resistance.  CN II through XII grossly intact.  Sensation intact and symmetric throughout.  PERRL and EOM intact.  No nystagmus.  Finger-to-nose cerebellar exam negative.  Coordination intact.  Psychiatric:        Mood and Affect: Mood normal.        Behavior: Behavior normal.        Thought Content: Thought content normal.     ED Results / Procedures / Treatments   Labs (all labs ordered are listed, but only abnormal results are displayed) Labs Reviewed  COMPREHENSIVE METABOLIC PANEL - Abnormal; Notable for the following components:      Result Value   Sodium 130 (*)    Potassium 3.3 (*)    Chloride 97 (*)    Glucose, Bld 105 (*)    Calcium 8.1 (*)    Albumin 2.6 (*)    AST 49 (*)    Alkaline Phosphatase 733 (*)    All other components within normal limits  CBC WITH DIFFERENTIAL/PLATELET - Abnormal; Notable for the following components:   RBC 3.66 (*)    Hemoglobin 11.7 (*)    HCT 36.6 (*)    All other components within normal limits  MAGNESIUM    EKG None  Radiology CT Angio Head W or Wo Contrast  Result Date: 04/30/2020 CLINICAL DATA:  Nonspecific dizziness and headache. EXAM: CT ANGIOGRAPHY HEAD AND NECK TECHNIQUE: Multidetector CT imaging of the head and neck was performed using the standard protocol during bolus administration of intravenous contrast. Multiplanar CT image reconstructions and MIPs were obtained to evaluate the vascular anatomy. Carotid stenosis measurements (when applicable) are obtained utilizing NASCET criteria, using the distal internal carotid diameter  as the denominator. CONTRAST:  93mL OMNIPAQUE IOHEXOL 350 MG/ML SOLN COMPARISON:  08/01/2018 FINDINGS: CT HEAD FINDINGS Brain: Age related atrophy. Chronic small-vessel ischemic changes of the cerebral hemispheric white matter and pons. No sign of acute infarction, mass lesion, hemorrhage, hydrocephalus or extra-axial collection. Vascular: There is atherosclerotic calcification of the major vessels at the base of the brain. Skull: Negative Sinuses: Clear Orbits: Normal Review of the MIP images confirms the above findings CTA NECK FINDINGS Aortic arch: Aortic atherosclerosis. Branching pattern is normal without origin stenosis. Right carotid system: Common carotid artery shows scattered plaque but is widely patent to the bifurcation. Extensive calcified plaque at the carotid bifurcation and ICA bulb. Minimal diameter in the ICA bulb is 2 mm. Compared to a more distal cervical ICA diameter of 5 mm, this indicates a 60% stenosis. Left carotid system: Common carotid artery shows scattered plaque but is patent to the bifurcation. Soft and calcified plaque at the carotid bifurcation and ICA bulb. Minimal diameter of the ICA bulb is 3 mm. Compared to a more distal cervical ICA diameter of 5 mm, this indicates a 40% stenosis. Calcification of the ICA just proximal to the skull base narrows the lumen to 2 mm, consistent with 60% stenosis in this location. Vertebral arteries: Atherosclerotic plaque at both vertebral artery origins. No stenosis  at the left origin. 30-50% stenosis of the proximal right vertebral artery. Beyond that, both vertebral arteries are patent through the cervical region, with areas of scattered calcified plaque. 50% stenosis of the right vertebral artery at the C3 level. Skeleton: Degenerative cervical spondylosis. Other neck: No mass or lymphadenopathy. Upper chest: Emphysema and pulmonary scarring. 12 mm mass lesion in the right upper lobe, further enlarged when compared to a CT of 10/20/2019. This is  quite likely to represent lung carcinoma. Review of the MIP images confirms the above findings CTA HEAD FINDINGS Anterior circulation: The internal carotid arteries are patent through the skull base and siphon regions. Extensive atherosclerotic calcification in both carotid siphon regions with stenosis estimated at 50% on both sides. Supraclinoid internal carotid arteries are widely patent. The anterior and middle cerebral vessels are patent. No large or medium vessel occlusion or correctable proximal stenosis. Posterior circulation: Both vertebral arteries are patent at the foramen magnum. On the right, there is occlusion just beyond PICA. On the left, there is occlusion just beyond PICA. The distal basilar artery is reconstituted by patent posterior communicating arteries. Flow is seen within both posterior cerebral arteries and both superior cerebellar arteries. Similar appearance to the study of July 2020. Venous sinuses: Patent Anatomic variants: None other significant. Review of the MIP images confirms the above findings IMPRESSION: 1. Extensive calcified plaque at both carotid bifurcations and ICA bulb regions. 60% stenosis of the ICA bulb on the right. 40% stenosis of the ICA bulb on the left. 60% stenosis of the left cervical ICA just proximal to the skull base. 2. 30-50% stenosis of the proximal right vertebral artery. 50% stenosis of the right vertebral artery at the C3 level. 3. Extensive atherosclerotic calcification in both carotid siphon regions with stenosis estimated at 50% on both sides. 4. No acute intracranial anterior circulation large or medium vessel occlusion or correctable proximal stenosis. 5. 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 Reconstitution of the distal basilar artery from patent posterior communicating arteries. 6. 12 mm mass lesion in the right upper lobe, further enlarged when compared to a CT of 10/20/2019. This is quite likely to represent lung carcinoma.  7. Emphysema and aortic atherosclerosis. Aortic Atherosclerosis (ICD10-I70.0) and Emphysema (ICD10-J43.9). Electronically Signed   By: Nelson Chimes M.D.   On: 04/30/2020 18:58   CT Angio Neck W and/or Wo Contrast  Result Date: 04/30/2020 CLINICAL DATA:  Nonspecific dizziness and headache. EXAM: CT ANGIOGRAPHY HEAD AND NECK TECHNIQUE: Multidetector CT imaging of the head and neck was performed using the standard protocol during bolus administration of intravenous contrast. Multiplanar CT image reconstructions and MIPs were obtained to evaluate the vascular anatomy. Carotid stenosis measurements (when applicable) are obtained utilizing NASCET criteria, using the distal internal carotid diameter as the denominator. CONTRAST:  11mL OMNIPAQUE IOHEXOL 350 MG/ML SOLN COMPARISON:  08/01/2018 FINDINGS: CT HEAD FINDINGS Brain: Age related atrophy. Chronic small-vessel ischemic changes of the cerebral hemispheric white matter and pons. No sign of acute infarction, mass lesion, hemorrhage, hydrocephalus or extra-axial collection. Vascular: There is atherosclerotic calcification of the major vessels at the base of the brain. Skull: Negative Sinuses: Clear Orbits: Normal Review of the MIP images confirms the above findings CTA NECK FINDINGS Aortic arch: Aortic atherosclerosis. Branching pattern is normal without origin stenosis. Right carotid system: Common carotid artery shows scattered plaque but is widely patent to the bifurcation. Extensive calcified plaque at the carotid bifurcation and ICA bulb. Minimal diameter in the ICA bulb is 2 mm.  Compared to a more distal cervical ICA diameter of 5 mm, this indicates a 60% stenosis. Left carotid system: Common carotid artery shows scattered plaque but is patent to the bifurcation. Soft and calcified plaque at the carotid bifurcation and ICA bulb. Minimal diameter of the ICA bulb is 3 mm. Compared to a more distal cervical ICA diameter of 5 mm, this indicates a 40% stenosis.  Calcification of the ICA just proximal to the skull base narrows the lumen to 2 mm, consistent with 60% stenosis in this location. Vertebral arteries: Atherosclerotic plaque at both vertebral artery origins. No stenosis at the left origin. 30-50% stenosis of the proximal right vertebral artery. Beyond that, both vertebral arteries are patent through the cervical region, with areas of scattered calcified plaque. 50% stenosis of the right vertebral artery at the C3 level. Skeleton: Degenerative cervical spondylosis. Other neck: No mass or lymphadenopathy. Upper chest: Emphysema and pulmonary scarring. 12 mm mass lesion in the right upper lobe, further enlarged when compared to a CT of 10/20/2019. This is quite likely to represent lung carcinoma. Review of the MIP images confirms the above findings CTA HEAD FINDINGS Anterior circulation: The internal carotid arteries are patent through the skull base and siphon regions. Extensive atherosclerotic calcification in both carotid siphon regions with stenosis estimated at 50% on both sides. Supraclinoid internal carotid arteries are widely patent. The anterior and middle cerebral vessels are patent. No large or medium vessel occlusion or correctable proximal stenosis. Posterior circulation: Both vertebral arteries are patent at the foramen magnum. On the right, there is occlusion just beyond PICA. On the left, there is occlusion just beyond PICA. The distal basilar artery is reconstituted by patent posterior communicating arteries. Flow is seen within both posterior cerebral arteries and both superior cerebellar arteries. Similar appearance to the study of July 2020. Venous sinuses: Patent Anatomic variants: None other significant. Review of the MIP images confirms the above findings IMPRESSION: 1. Extensive calcified plaque at both carotid bifurcations and ICA bulb regions. 60% stenosis of the ICA bulb on the right. 40% stenosis of the ICA bulb on the left. 60% stenosis of  the left cervical ICA just proximal to the skull base. 2. 30-50% stenosis of the proximal right vertebral artery. 50% stenosis of the right vertebral artery at the C3 level. 3. Extensive atherosclerotic calcification in both carotid siphon regions with stenosis estimated at 50% on both sides. 4. No acute intracranial anterior circulation large or medium vessel occlusion or correctable proximal stenosis. 5. 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 Reconstitution of the distal basilar artery from patent posterior communicating arteries. 6. 12 mm mass lesion in the right upper lobe, further enlarged when compared to a CT of 10/20/2019. This is quite likely to represent lung carcinoma. 7. Emphysema and aortic atherosclerosis. Aortic Atherosclerosis (ICD10-I70.0) and Emphysema (ICD10-J43.9). Electronically Signed   By: Nelson Chimes M.D.   On: 04/30/2020 18:58   DG Chest Portable 1 View  Result Date: 04/30/2020 CLINICAL DATA:  Syncope, history of laryngeal cancer, history of tobacco abuse EXAM: PORTABLE CHEST 1 VIEW COMPARISON:  02/22/2020 FINDINGS: 2 frontal views of the chest demonstrate an unremarkable cardiac silhouette. No acute airspace disease, effusion, or pneumothorax. No acute bony abnormalities. IMPRESSION: 1. No acute intrathoracic process. Electronically Signed   By: Randa Ngo M.D.   On: 04/30/2020 18:03    Procedures Procedures   Medications Ordered in ED Medications  sodium chloride 0.9 % bolus 1,000 mL (has no administration in time range)  sodium chloride 0.9 % bolus 1,000 mL (1,000 mLs Intravenous New Bag/Given 04/30/20 1743)  acetaminophen (TYLENOL) tablet 650 mg (650 mg Oral Given 04/30/20 1746)  iohexol (OMNIPAQUE) 350 MG/ML injection 75 mL (75 mLs Intravenous Contrast Given 04/30/20 1823)    ED Course  I have reviewed the triage vital signs and the nursing notes.  Pertinent labs & imaging results that were available during my care of the patient were  reviewed by me and considered in my medical decision making (see chart for details).  Clinical Course as of 04/30/20 1917  Fri Apr 30, 2020  [id number] I spoke with Dr. Leonel Ramsay who states that patient should be admitted for his orthostasis and given IV fluid resuscitation as that may be the cause for his episodes.  If after fluid resuscitation he has yet another event, he would need to be transferred to [facility] for EEG or wait until Monday for EEG at [facility].  He would also benefit from MRI if he has another syncopal/seizure episode. [GG]    Clinical Course User Index [GG] Corena Herter, PA-C   MDM Rules/Calculators/A&P                          DOYT CASTELLANA was evaluated in Emergency Department on 04/30/2020 for the symptoms described in the history of present illness. He was evaluated in the context of the global COVID-19 pandemic, which necessitated consideration that the patient might be at risk for infection with the SARS-CoV-2 virus that causes COVID-19. Institutional protocols and algorithms that pertain to the evaluation of patients at risk for COVID-19 are in a state of rapid change based on information released by regulatory bodies including the CDC and federal and state organizations. These policies and algorithms were followed during the patient's care in the ED.  I personally reviewed patient's medical chart and all notes from triage and staff during today's encounter. I have also ordered and reviewed all labs and imaging that I felt to be medically necessary in the evaluation of this patient's complaints and with consideration of their physical exam. If needed, translation services were available and utilized.   Patient in the ED with episodes of syncope versus seizure.  It is unclear at this time.  There is no tongue biting or incontinence to suggest seizures.  No history of seizure disorder.  His extremities "locked up" and become tense during his episodes of LOC, however still  cannot rule out syncope as cause.  He complains of headache and "head fuzziness" that worsens immediately before he has an episode.  For that reason, we will obtain CTA head and neck in addition to basic labs and chest x-ray for syncope work-up.    Orthostatics are very positive.  He went from 163/91 while lying to 83/69 immediately after standing with associated increase in heart rate.  We will administer 2 L IV NS.  CMP with downtrending electrolytes, hyponatremia to 130 and hypokalemia 3.3.  These are down from 136 and 5.0 respectively in labs obtained just 2 weeks ago.  Magnesium and hemoglobin are stable.  No leukocytosis.  Chest x-ray is reviewed and without any acute cardiopulmonary disease.  CTA of head and neck demonstrates extensive calcified plaque at both carotid bifurcations and ICA bulb regions.  There is also stenosis of the right vertebral artery and atherosclerotic calcification in both carotid siphon regions with stenosis estimated at 50% on both sides.  There is no acute intracranial large  or medium vessel occlusion or correctable proximal stenosis.  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  Finally, there is also a 12 mm mass lesion in the right upper lobe concerning for lung carcinoma.  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 Delton Coombes 02/13/2020 and is known.    I spoke with Dr. Leonel Ramsay who states that patient should be admitted for his orthostasis and given IV fluid resuscitation as that may be the cause for his episodes.  If after fluid resuscitation he has yet another event, he would need to be transferred to [facility] for EEG or wait until Monday for EEG at [facility].  He would also benefit from MRI if he has another syncopal/seizure episode.  At shift change, Dr. Roderic Palau will admit to hospitalist.  Final Clinical Impression(s) / ED Diagnoses Final diagnoses:  Orthostasis     Rx / DC Orders ED Discharge Orders    None       Reita Chard 04/30/20 Dellia Cloud, MD 05/01/20 1438

## 2020-04-30 NOTE — ED Triage Notes (Signed)
Frequent syncopal episodes for the past week

## 2020-05-01 DIAGNOSIS — R531 Weakness: Secondary | ICD-10-CM

## 2020-05-01 DIAGNOSIS — I951 Orthostatic hypotension: Secondary | ICD-10-CM | POA: Diagnosis not present

## 2020-05-01 DIAGNOSIS — E876 Hypokalemia: Secondary | ICD-10-CM

## 2020-05-01 DIAGNOSIS — R55 Syncope and collapse: Secondary | ICD-10-CM

## 2020-05-01 DIAGNOSIS — E871 Hypo-osmolality and hyponatremia: Secondary | ICD-10-CM

## 2020-05-01 LAB — CBC WITH DIFFERENTIAL/PLATELET
Abs Immature Granulocytes: 0.02 10*3/uL (ref 0.00–0.07)
Basophils Absolute: 0.1 10*3/uL (ref 0.0–0.1)
Basophils Relative: 1 %
Eosinophils Absolute: 0.4 10*3/uL (ref 0.0–0.5)
Eosinophils Relative: 6 %
HCT: 29.4 % — ABNORMAL LOW (ref 39.0–52.0)
Hemoglobin: 9.4 g/dL — ABNORMAL LOW (ref 13.0–17.0)
Immature Granulocytes: 0 %
Lymphocytes Relative: 25 %
Lymphs Abs: 1.5 10*3/uL (ref 0.7–4.0)
MCH: 32.3 pg (ref 26.0–34.0)
MCHC: 32 g/dL (ref 30.0–36.0)
MCV: 101 fL — ABNORMAL HIGH (ref 80.0–100.0)
Monocytes Absolute: 0.4 10*3/uL (ref 0.1–1.0)
Monocytes Relative: 7 %
Neutro Abs: 3.7 10*3/uL (ref 1.7–7.7)
Neutrophils Relative %: 61 %
Platelets: 240 10*3/uL (ref 150–400)
RBC: 2.91 MIL/uL — ABNORMAL LOW (ref 4.22–5.81)
RDW: 14.3 % (ref 11.5–15.5)
WBC: 6.1 10*3/uL (ref 4.0–10.5)
nRBC: 0 % (ref 0.0–0.2)

## 2020-05-01 LAB — TSH: TSH: 2.605 u[IU]/mL (ref 0.350–4.500)

## 2020-05-01 LAB — COMPREHENSIVE METABOLIC PANEL
ALT: 18 U/L (ref 0–44)
AST: 57 U/L — ABNORMAL HIGH (ref 15–41)
Albumin: 2 g/dL — ABNORMAL LOW (ref 3.5–5.0)
Alkaline Phosphatase: 672 U/L — ABNORMAL HIGH (ref 38–126)
Anion gap: 4 — ABNORMAL LOW (ref 5–15)
BUN: 7 mg/dL (ref 6–20)
CO2: 26 mmol/L (ref 22–32)
Calcium: 7.8 mg/dL — ABNORMAL LOW (ref 8.9–10.3)
Chloride: 105 mmol/L (ref 98–111)
Creatinine, Ser: 0.68 mg/dL (ref 0.61–1.24)
GFR, Estimated: >60 mL/min (ref >60)
Glucose, Bld: 75 mg/dL (ref 70–99)
Potassium: 3.9 mmol/L (ref 3.5–5.1)
Sodium: 135 mmol/L (ref 135–145)
Total Bilirubin: 0.9 mg/dL (ref 0.3–1.2)
Total Protein: 5 g/dL — ABNORMAL LOW (ref 6.5–8.1)

## 2020-05-01 LAB — SARS CORONAVIRUS 2 (TAT 6-24 HRS): SARS Coronavirus 2: NEGATIVE

## 2020-05-01 LAB — MAGNESIUM: Magnesium: 1.6 mg/dL — ABNORMAL LOW (ref 1.7–2.4)

## 2020-05-01 MED ORDER — MAGNESIUM SULFATE 4 GM/100ML IV SOLN
4.0000 g | Freq: Once | INTRAVENOUS | Status: AC
  Administered 2020-05-01: 4 g via INTRAVENOUS
  Filled 2020-05-01: qty 100

## 2020-05-01 MED ORDER — SODIUM CHLORIDE 0.9 % IV SOLN: INTRAVENOUS | Status: DC

## 2020-05-01 NOTE — Progress Notes (Signed)
ASSUMPTION OF CARE NOTE   05/01/2020 4:29 PM  [name] was seen and examined.  The H&P by the admitting provider, orders, imaging was reviewed.  Please see new orders.  Will continue to follow.   Vitals:   05/01/20 0802 05/01/20 1441  BP: 138/90 132/88  Pulse: 60 (!) 50  Resp:  20  Temp:  (!) 97.5 F (36.4 C)  SpO2:  99%    Results for orders placed or performed during the hospital encounter of 04/30/20  Magnesium  Result Value Ref Range   Magnesium 1.8 1.7 - 2.4 mg/dL  Comprehensive metabolic panel  Result Value Ref Range   Sodium 130 (L) 135 - 145 mmol/L   Potassium 3.3 (L) 3.5 - 5.1 mmol/L   Chloride 97 (L) 98 - 111 mmol/L   CO2 25 22 - 32 mmol/L   Glucose, Bld 105 (H) 70 - 99 mg/dL   BUN 12 6 - 20 mg/dL   Creatinine, Ser 0.90 0.61 - 1.24 mg/dL   Calcium 8.1 (L) 8.9 - 10.3 mg/dL   Total Protein 6.6 6.5 - 8.1 g/dL   Albumin 2.6 (L) 3.5 - 5.0 g/dL   AST 49 (H) 15 - 41 U/L   ALT 20 0 - 44 U/L   Alkaline Phosphatase 733 (H) 38 - 126 U/L   Total Bilirubin 0.6 0.3 - 1.2 mg/dL   GFR, Estimated >60 >60 mL/min   Anion gap 8 5 - 15  CBC with Differential  Result Value Ref Range   WBC 5.1 4.0 - 10.5 K/uL   RBC 3.66 (L) 4.22 - 5.81 MIL/uL   Hemoglobin 11.7 (L) 13.0 - 17.0 g/dL   HCT 36.6 (L) 39.0 - 52.0 %   MCV 100.0 80.0 - 100.0 fL   MCH 32.0 26.0 - 34.0 pg   MCHC 32.0 30.0 - 36.0 g/dL   RDW 14.1 11.5 - 15.5 %   Platelets 356 150 - 400 K/uL   nRBC 0.0 0.0 - 0.2 %   Neutrophils Relative % 59 %   Neutro Abs 3.0 1.7 - 7.7 K/uL   Lymphocytes Relative 28 %   Lymphs Abs 1.5 0.7 - 4.0 K/uL   Monocytes Relative 7 %   Monocytes Absolute 0.4 0.1 - 1.0 K/uL   Eosinophils Relative 5 %   Eosinophils Absolute 0.3 0.0 - 0.5 K/uL   Basophils Relative 1 %   Basophils Absolute 0.0 0.0 - 0.1 K/uL   Immature Granulocytes 0 %   Abs Immature Granulocytes 0.02 0.00 - 0.07 K/uL  Comprehensive metabolic panel  Result Value Ref Range   Sodium 135 135 - 145 mmol/L   Potassium 3.9  3.5 - 5.1 mmol/L   Chloride 105 98 - 111 mmol/L   CO2 26 22 - 32 mmol/L   Glucose, Bld 75 70 - 99 mg/dL   BUN 7 6 - 20 mg/dL   Creatinine, Ser 0.68 0.61 - 1.24 mg/dL   Calcium 7.8 (L) 8.9 - 10.3 mg/dL   Total Protein 5.0 (L) 6.5 - 8.1 g/dL   Albumin 2.0 (L) 3.5 - 5.0 g/dL   AST 57 (H) 15 - 41 U/L   ALT 18 0 - 44 U/L   Alkaline Phosphatase 672 (H) 38 - 126 U/L   Total Bilirubin 0.9 0.3 - 1.2 mg/dL   GFR, Estimated >60 >60 mL/min   Anion gap 4 (L) 5 - 15  Magnesium  Result Value Ref Range   Magnesium 1.6 (L) 1.7 - 2.4 mg/dL  CBC WITH DIFFERENTIAL  Result Value Ref Range   WBC 6.1 4.0 - 10.5 K/uL   RBC 2.91 (L) 4.22 - 5.81 MIL/uL   Hemoglobin 9.4 (L) 13.0 - 17.0 g/dL   HCT 29.4 (L) 39.0 - 52.0 %   MCV 101.0 (H) 80.0 - 100.0 fL   MCH 32.3 26.0 - 34.0 pg   MCHC 32.0 30.0 - 36.0 g/dL   RDW 14.3 11.5 - 15.5 %   Platelets 240 150 - 400 K/uL   nRBC 0.0 0.0 - 0.2 %   Neutrophils Relative % 61 %   Neutro Abs 3.7 1.7 - 7.7 K/uL   Lymphocytes Relative 25 %   Lymphs Abs 1.5 0.7 - 4.0 K/uL   Monocytes Relative 7 %   Monocytes Absolute 0.4 0.1 - 1.0 K/uL   Eosinophils Relative 6 %   Eosinophils Absolute 0.4 0.0 - 0.5 K/uL   Basophils Relative 1 %   Basophils Absolute 0.1 0.0 - 0.1 K/uL   Immature Granulocytes 0 %   Abs Immature Granulocytes 0.02 0.00 - 0.07 K/uL  TSH  Result Value Ref Range   TSH 2.605 0.350 - 4.500 uIU/mL     C. Wynetta Emery, MD [facility]   04/30/2020  4:54 PM How to contact the [facility] Attending or Consulting provider [place] or covering provider during after hours [place], for this patient?  1. Check the care team in [facility] and look for a) attending/consulting [facility] provider listed and b) the [facility] team listed 2. Log into [email or web address] and use [place]'s universal password to access. If you do not have the password, please contact the hospital operator. 3. Locate the [facility] provider you are looking for under [facility] and page to a number that you can be  directly reached. 4. If you still have difficulty reaching the provider, please page the [facility] (Director on Call) for the Hospitalists listed on amion for assistance.

## 2020-05-01 NOTE — Progress Notes (Signed)
Pt refused heparin shot and SCDs. Pt educated on the risk and reports understanding.

## 2020-05-01 NOTE — H&P (Signed)
TRH H&P    Patient Demographics:    [name], is a 60 y.o. male  MRN: [id number]  DOB - [birth year]  Admit Date - 04/30/2020  Referring MD/NP/PA: Roderic Palau  Outpatient Primary MD for the patient is [name], MD  Patient coming from: Home  Chief complaint- syncope   HPI:    [name]  is a 60 y.o. 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  Patient reports that he has been having syncopal events for 1 month.  They are getting more frequent.  He is not sure how long he was out when it happens.  His wife is at bedside and reports that he is out for 3 or 4 minutes.  He reports that he feels like he is spinning in the back of his neck hurts before he passes out.  He denies any chest pain, palpitations, dyspnea associated with these events.  He reports he has had 3 syncopal episodes today.  Patient does admit that he has had a decrease in appetite, but has been placed on Marinol in the outpatient setting and that has been helping.  He reports that he drinks half a gallon of water a day.  He eats 2 or 3 meals a day but reports that he is really more of a grazer.  He denies any fevers.  Patient reports that he was told to stop taking blood pressure medications, and did not know until today and that his Norvasc is one of his blood pressure medications.  He is been continuing to take it.  The syncopal events seem to happen mostly when he goes from sitting to standing.  He reports when he feels his preceding symptoms, he tries to make it to a chair, and if he is able to sit he does not pass out and his symptoms resolved.  His wife reports that he has occasionally had events when he is seated, where his whole body becomes stiff and his arm  stick out.  When he awakes from these episodes he is immediately oriented with no suspicion for postictal state.  Patient reports that today one of his syncopal episodes occurred from standing and he did fall and hit the back of his head.  He reports no pain in the back of his head now.  He does still have the neck pain that is like a dull ache.  This pain has been present since the syncopal episode started a month ago.  In the ED Temp 97.7, heart rate 50-54, respiratory 10-16, blood pressure 167/88, satting 100% No leukocytosis with a white blood cell count of 5.1, hemoglobin 11.7 Chemistry panel reveals a hypokalemia at 3.3 Alk phos elevated at 733 AST minimally elevated at 49 CTA head and neck shows extensive calcified plaque at both carotid bifurcations and ICA bulb regions.  60% stenosis of the ICA bulb on the right, 40% stenosis of the ICA bulb  on the left.  60% stenosis of the left cervical ICA just proximal to the skull base.  30 to 50% stenosis of the proximal right vertebral artery.  50% stenosis of the right vertebral artery at the C3 level.  Extensive atherosclerotic calcification in both carotid siphon regions with stenosis estimated at 50%.  No acute intracranial anterior circulation large or medium vessel occlusion or correctable proximal stenosis. Chest x-ray shows no acute abnormalities Dr. Leonel Ramsay was consulted and recommends admission for hydration, and if he continues to have more episodes he may need an EEG and an MRI. 2 L bolus in ED Admission requested for was likely orthostatic syncope    Review of systems:    In addition to the HPI above,  No Fever-chills, No Headache, No changes with Vision or hearing, No problems swallowing food or Liquids, No Chest pain, Cough or Shortness of Breath, No Abdominal pain, No Nausea or Vomiting, bowel movements are regular, No Blood in stool or Urine, No dysuria, No new skin rashes or bruises, No new joints pains-aches,  No new  weakness, tingling, numbness in any extremity, No recent weight gain or loss, No polyuria, polydypsia or polyphagia, No significant Mental Stressors.  All other systems reviewed and are negative.    Past History of the following :    Past Medical History:  Diagnosis Date  . Anemia   . Anxiety   . Arthritis   . Cancer of larynx ([place])   . Carotid artery stenosis   . Chronic lower back pain   . Coronary artery disease    a. Diag cath 10/2012 for CP/abnormal nuc -> PCI s/p LAD atherectomy/DES placement 11/05/12.  . Depression   . Dilated aortic root ([place])   . GERD (gastroesophageal reflux disease)   . History of hiatal hernia   . Hypercholesteremia   . Hypertension   . Pneumonia   . S/P percutaneous endoscopic gastrostomy (PEG) tube placement ([place])   . Stroke [facility]) 07/31/2018   Pons      Past Surgical History:  Procedure Laterality Date  . BIOPSY  11/24/2019   Procedure: BIOPSY;  Surgeon: Rush Landmark Telford Nab., MD;  Location: [place];  Service: Gastroenterology;;  . BIOPSY  03/29/2020   Procedure: BIOPSY;  Surgeon: Irving Copas., MD;  Location: [facility];  Service: Gastroenterology;;  . CARDIAC CATHETERIZATION  10/29/2012  . COLONOSCOPY WITH PROPOFOL N/A 11/24/2019   Procedure: COLONOSCOPY WITH PROPOFOL;  Surgeon: Rush Landmark Telford Nab., MD;  Location: [place];  Service: Gastroenterology;  Laterality: N/A;  . CORONARY ANGIOPLASTY WITH STENT PLACEMENT  11/05/2012  . DIRECT LARYNGOSCOPY N/A 01/02/2019   Procedure: MICRO DIRECT LARYNGOSCOPY BIOPSY OF LARYNGEAL MASS;  Surgeon: [name], MD;  [facility];  Service: ENT;  Laterality: N/A;  . ESOPHAGOGASTRODUODENOSCOPY (EGD) WITH PROPOFOL N/A 08/11/2019   Procedure: ATTEMPTED ESOPHAGOGASTRODUODENOSCOPY (EGD) WITH PROPOFOL (UNABLE TO PERFORM PERCUTANEOUS ENDOSCOPIC GASTROSTOMY TUBE REPLACEMENT);  Surgeon: [name], MD;  Location: [facility];  Service: Gastroenterology;  Laterality: N/A;  .  ESOPHAGOGASTRODUODENOSCOPY (EGD) WITH PROPOFOL N/A 11/24/2019   Procedure: ESOPHAGOGASTRODUODENOSCOPY (EGD) WITH PROPOFOL;  Surgeon: Rush Landmark Telford Nab., MD;  Location: [place];  Service: Gastroenterology;  Laterality: N/A;  . ESOPHAGOGASTRODUODENOSCOPY (EGD) WITH PROPOFOL N/A 01/26/2020   Procedure: ESOPHAGOGASTRODUODENOSCOPY (EGD) WITH PROPOFOL;  Surgeon: Rush Landmark Telford Nab., MD;  Location: [place];  Service: Gastroenterology;  Laterality: N/A;  . ESOPHAGOGASTRODUODENOSCOPY (EGD) WITH PROPOFOL N/A 03/29/2020   Procedure: ESOPHAGOGASTRODUODENOSCOPY (EGD) WITH PROPOFOL;  Surgeon: Rush Landmark Telford Nab., MD;  Location: [facility];  Service: Gastroenterology;  Laterality: N/A;  NEEDS FLOURO  . HEMORRHOID SURGERY  ~ 2010  . INSERTION OF MESH N/A 06/02/2015   Procedure: INSERTION OF MESH;  Surgeon: [name], MD;  Location: [facility];  Service: General;  Laterality: N/A;  . IR ANGIO INTRA EXTRACRAN SEL COM CAROTID INNOMINATE BILAT MOD SED  08/02/2018  . IR ANGIO VERTEBRAL SEL VERTEBRAL BILAT MOD SED  08/02/2018  . IR REPLACE G-TUBE SIMPLE WO FLUORO  08/12/2019  . IR REPLC GASTRO/COLONIC TUBE PERCUT W/FLUORO  08/13/2019  . LAPAROSCOPIC INSERTION GASTROSTOMY TUBE N/A 02/24/2019   Procedure: LAPAROSCOPIC INSERTION GASTROSTOMY TUBE;  Surgeon: [name], MD;  Location: [place];  Service: General;  Laterality: N/A;  . LEFT HEART CATHETERIZATION WITH CORONARY ANGIOGRAM N/A 10/30/2012   Procedure: LEFT HEART CATHETERIZATION WITH CORONARY ANGIOGRAM;  Surgeon: [name], MD;  Location: [place] [facility];  Service: Cardiovascular;  Laterality: N/A;  . MULTIPLE EXTRACTIONS WITH ALVEOLOPLASTY N/A 02/18/2019   Procedure: Extraction of tooth #'s 5-7, 10,11,14,20-29, 31 and 32 with alveoloplasty and maxiillary left buccal exostoses reductions;  Surgeon: [name], [name];  Location: [place];  Service: Oral Surgery;  Laterality: N/A;  . PEG PLACEMENT N/A 01/26/2020   Procedure: PERCUTANEOUS  ENDOSCOPIC GASTROSTOMY (PEG) REPLACEMENT;  Surgeon: Irving Copas., MD;  Location: [place];  Service: Gastroenterology;  Laterality: N/A;  . PERCUTANEOUS CORONARY ROTOBLATOR INTERVENTION (PCI-R) N/A 11/05/2012   Procedure: PERCUTANEOUS CORONARY ROTOBLATOR INTERVENTION (PCI-R);  Surgeon: [name], MD;  Location: [facility];  Service: Cardiovascular;  Laterality: N/A;  . SAVORY DILATION N/A 11/24/2019   Procedure: SAVORY DILATION;  Surgeon: Irving Copas., MD;  Location: [place];  Service: Gastroenterology;  Laterality: N/A;  . SAVORY DILATION N/A 01/26/2020   Procedure: SAVORY DILATION;  Surgeon: Irving Copas., MD;  Location: [place];  Service: Gastroenterology;  Laterality: N/A;  . TRACHEOSTOMY TUBE PLACEMENT N/A 02/18/2019   Procedure: Tracheostomy;  Surgeon: [name], MD;  Location: [place];  Service: ENT;  Laterality: N/A;  . UMBILICAL HERNIA REPAIR N/A 06/02/2015   Procedure: UMBILICAL HERNIORRHAPHY WITH MESH;  Surgeon: [name], MD;  Location[facility];  Service: General;  Laterality: N/A;      Social History:      Social History   Tobacco Use  . Smoking status: Former Smoker    Packs/day: 3.00    Years: 32.00    Pack years: 96.00    Types: Cigarettes    Start date: 08/29/1977    Quit date: 11/29/2006    Years since quitting: 13.4  . Smokeless tobacco: Former Systems developer    Types: Chew    Quit date: 2003  . Tobacco comment: 11/05/2012 "chewed tobacco when I play ball; haven't chewed since age 20"  Substance Use Topics  . Alcohol use: Yes    Alcohol/week: 12.0 standard drinks    Types: 12 Cans of beer per week    Comment: per wife [name] 6-7/week now as of 02/24/19       Family History :     Family History  Problem Relation Age of Onset  . Hypertension Mother   . Heart disease Father   . Hypertension Father   . Heart disease Sister   . Hypertension Maternal Grandmother   . Hypertension Maternal Grandfather   . Hypertension  Paternal Grandmother   . Hypertension Paternal Grandfather   . Pancreatic cancer Maternal Uncle   . Breast cancer Maternal Aunt   . Breast cancer Maternal Aunt   .  Diabetes Paternal Uncle   . Diabetes Paternal Aunt       Home Medications:   Prior to Admission medications   Medication Sig Start Date End Date Taking? Authorizing Provider  acetaminophen (TYLENOL) 325 MG tablet Take 2 tablets (650 mg total) by mouth every 6 (six) hours as needed for mild pain, fever or headache (or Fever >/= 101). Patient taking differently: Take 500 mg by mouth every 6 (six) hours as needed for mild pain, fever or headache (or Fever >/= 101). 04/14/19  Yes Emokpae, Courage, MD  amLODipine (NORVASC) 5 MG tablet Take 5 mg by mouth daily.   Yes [provider]  aspirin EC 81 MG tablet Take 1 tablet (81 mg total) by mouth daily with breakfast. 04/15/20  Yes Emokpae, Courage, MD  atorvastatin (LIPITOR) 40 MG tablet Take 80 mg by mouth at bedtime. 01/17/20  Yes [provider]  diphenoxylate-atropine (LOMOTIL) 2.5-0.025 MG tablet Take 2 tablets by mouth in the morning, at noon, and at bedtime. 02/17/20  Yes [name], MD  dronabinol (MARINOL) 5 MG capsule Take 1 capsule (5 mg total) by mouth 2 (two) times daily before a meal. 04/15/20  Yes Emokpae, Courage, MD  HYDROcodone-acetaminophen (NORCO) 10-325 MG tablet Take 1 tablet by mouth every 4 (four) hours as needed. 06/02/15  Yes [name], MD  loperamide (IMODIUM) 2 MG capsule Take 2 mg by mouth daily as needed for diarrhea or loose stools.   Yes [provider]  metoprolol tartrate (LOPRESSOR) 25 MG tablet Take 12.5 mg by mouth 2 (two) times daily.   Yes [provider]  mirtazapine (REMERON) 7.5 MG tablet Take 1 tablet (7.5 mg total) by mouth at bedtime. 04/15/20 04/15/21 Yes Emokpae, Courage, MD  nitroGLYCERIN (NITROSTAT) 0.4 MG SL tablet Place 1 tablet (0.4 mg total) under the tongue every 5 (five) minutes as needed for  chest pain (CP or SOB). 07/27/14  Yes Herminio Commons, MD  potassium chloride SA (KLOR-CON) 20 MEQ tablet Take 20 mEq by mouth daily. 04/22/20  Yes [provider]  prochlorperazine (COMPAZINE) 10 MG tablet TAKE 1 TABLET(10 MG) BY MOUTH EVERY 6 HOURS AS NEEDED FOR NAUSEA Patient taking differently: Take 10 mg by mouth every 6 (six) hours as needed for nausea. 08/19/19  Yes Lockamy, Randi L, NP-C  traMADol (ULTRAM) 50 MG tablet Take 50-100 mg by mouth 4 (four) times daily as needed for moderate pain (pain.). 01/29/19  Yes [provider]  diazepam (VALIUM) 10 MG tablet Place 10 mg into feeding tube 3 (three) times daily as needed for anxiety or sleep.  Patient not taking: Reported on 04/13/2020 05/28/19   [provider]  omeprazole (PRILOSEC) 40 MG capsule Take 1 capsule (40 mg total) by mouth 2 (two) times daily before a meal. Patient not taking: No sig reported 04/15/20   Roxan Hockey, MD  ondansetron (ZOFRAN) 4 MG tablet Take 1 tablet (4 mg total) by mouth every 8 (eight) hours as needed for nausea or vomiting. Patient not taking: No sig reported 02/22/20   Horton, Kristie M, DO  potassium chloride (KLOR-CON) 10 MEQ tablet Take 1 tablet (10 mEq total) by mouth daily. Patient not taking: Reported on 04/30/2020 02/22/20   Lorelle Gibbs, DO     Allergies:     Allergies  Allergen Reactions  . Duloxetine Other (See Comments)    Caused depression/ patient is taking  . Prednisone Other (See Comments)    Chest pain     Physical  Exam:   Vitals  Blood pressure (!) 121/93, pulse 62, temperature 97.9 F (36.6 C), temperature source Oral, resp. rate 16, height 6' (1.829 m), weight 55.7 kg, SpO2 100 %.  1.  General: Patient lying supine in bed, chronically ill-appearing, no acute distress  2. Psychiatric: Mood and behavior normal for situation, alert and oriented x3, cooperative with exam  3. Neurologic: Face symmetric, speech and language normal, moves all  4 extremities voluntarily, at baseline, no acute deficits on limited exam  4. HEENMT:  Head is atraumatic, normocephalic, pupils reactive to light, neck is supple, trachea is midline, mucous membranes mildly dry  5. Respiratory : Lungs are clear to auscultation bilaterally without wheezes, rhonchi, rales, no increased work of breathing, no accessory muscle use  6. Cardiovascular : Heart rate is normal, rhythm is regular, no murmurs rubs or gallops, no peripheral edema  7. Gastrointestinal:  Abdomen is soft, nondistended, nontender to palpation, bowel sounds active, no palpable masses  8. Skin:  Skin is warm dry and intact without acute lesion on limited exam  9.Musculoskeletal:  Atrophy of the musculature of the extremities, no acute deformities, peripheral pulses palpated    Data Review:    CBC Recent Labs  Lab 04/30/20 1710  WBC 5.1  HGB 11.7*  HCT 36.6*  PLT 356  MCV 100.0  MCH 32.0  MCHC 32.0  RDW 14.1  LYMPHSABS 1.5  MONOABS 0.4  EOSABS 0.3  BASOSABS 0.0   ------------------------------------------------------------------------------------------------------------------  Results for orders placed or performed during the hospital encounter of 04/30/20 (from the past 48 hour(s))  Magnesium     Status: None   Collection Time: 04/30/20  5:10 PM  Result Value Ref Range   Magnesium 1.8 1.7 - 2.4 mg/dL    Comment: Performed at [facility], [address], [place], [place] [address]  Comprehensive metabolic panel     Status: Abnormal   Collection Time: 04/30/20  5:10 PM  Result Value Ref Range   Sodium 130 (L) 135 - 145 mmol/L   Potassium 3.3 (L) 3.5 - 5.1 mmol/L   Chloride 97 (L) 98 - 111 mmol/L   CO2 25 22 - 32 mmol/L   Glucose, Bld 105 (H) 70 - 99 mg/dL    Comment: Glucose reference range applies only to samples taken after fasting for at least 8 hours.   BUN 12 6 - 20 mg/dL   Creatinine, Ser 0.90 0.61 - 1.24 mg/dL   Calcium 8.1 (L) 8.9 - 10.3 mg/dL   Total  Protein 6.6 6.5 - 8.1 g/dL   Albumin 2.6 (L) 3.5 - 5.0 g/dL   AST 49 (H) 15 - 41 U/L   ALT 20 0 - 44 U/L   Alkaline Phosphatase 733 (H) 38 - 126 U/L   Total Bilirubin 0.6 0.3 - 1.2 mg/dL   GFR, Estimated >60 >60 mL/min    Comment: (NOTE) Calculated using the CKD-EPI Creatinine Equation (2021)    Anion gap 8 5 - 15    Comment: Performed at [facility], [address], [place], [place] [address]  CBC with Differential     Status: Abnormal   Collection Time: 04/30/20  5:10 PM  Result Value Ref Range   WBC 5.1 4.0 - 10.5 K/uL   RBC 3.66 (L) 4.22 - 5.81 MIL/uL   Hemoglobin 11.7 (L) 13.0 - 17.0 g/dL   HCT 36.6 (L) 39.0 - 52.0 %   MCV 100.0 80.0 - 100.0 fL   MCH 32.0 26.0 - 34.0 pg  MCHC 32.0 30.0 - 36.0 g/dL   RDW 14.1 11.5 - 15.5 %   Platelets 356 150 - 400 K/uL   nRBC 0.0 0.0 - 0.2 %   Neutrophils Relative % 59 %   Neutro Abs 3.0 1.7 - 7.7 K/uL   Lymphocytes Relative 28 %   Lymphs Abs 1.5 0.7 - 4.0 K/uL   Monocytes Relative 7 %   Monocytes Absolute 0.4 0.1 - 1.0 K/uL   Eosinophils Relative 5 %   Eosinophils Absolute 0.3 0.0 - 0.5 K/uL   Basophils Relative 1 %   Basophils Absolute 0.0 0.0 - 0.1 K/uL   Immature Granulocytes 0 %   Abs Immature Granulocytes 0.02 0.00 - 0.07 K/uL    Comment: Performed at [facility], [address] [place], [place] [address]    Chemistries  Recent Labs  Lab 04/30/20 1710  NA 130*  K 3.3*  CL 97*  CO2 25  GLUCOSE 105*  BUN 12  CREATININE 0.90  CALCIUM 8.1*  MG 1.8  AST 49*  ALT 20  ALKPHOS 733*  BILITOT 0.6   ------------------------------------------------------------------------------------------------------------------  ------------------------------------------------------------------------------------------------------------------ GFR: Estimated Creatinine Clearance: 70.5 mL/min (by C-G formula based on SCr of 0.9 mg/dL). Liver Function Tests: Recent Labs  Lab 04/30/20 1710  AST 49*  ALT 20  ALKPHOS 733*  BILITOT 0.6   PROT 6.6  ALBUMIN 2.6*   No results for input(s): LIPASE, AMYLASE in the last 168 hours. No results for input(s): AMMONIA in the last 168 hours. Coagulation Profile: No results for input(s): INR, PROTIME in the last 168 hours. Cardiac Enzymes: No results for input(s): CKTOTAL, CKMB, CKMBINDEX, TROPONINI in the last 168 hours. BNP (last 3 results) No results for input(s): PROBNP in the last 8760 hours. HbA1C: No results for input(s): HGBA1C in the last 72 hours. CBG: No results for input(s): GLUCAP in the last 168 hours. Lipid Profile: No results for input(s): CHOL, HDL, LDLCALC, TRIG, CHOLHDL, LDLDIRECT in the last 72 hours. Thyroid Function Tests: No results for input(s): TSH, T4TOTAL, FREET4, T3FREE, THYROIDAB in the last 72 hours. Anemia Panel: No results for input(s): VITAMINB12, FOLATE, FERRITIN, TIBC, IRON, RETICCTPCT in the last 72 hours.  --------------------------------------------------------------------------------------------------------------- Urine analysis:    Component Value Date/Time   COLORURINE AMBER (A) 04/13/2020 1719   APPEARANCEUR CLEAR 04/13/2020 1719   LABSPEC 1.025 04/13/2020 1719   PHURINE 5.0 04/13/2020 1719   GLUCOSEU NEGATIVE 04/13/2020 1719   HGBUR NEGATIVE 04/13/2020 1719   BILIRUBINUR NEGATIVE 04/13/2020 1719   KETONESUR NEGATIVE 04/13/2020 1719   PROTEINUR NEGATIVE 04/13/2020 1719   NITRITE NEGATIVE 04/13/2020 1719   LEUKOCYTESUR NEGATIVE 04/13/2020 1719      Imaging Results:    CT Angio Head W or Wo Contrast  Result Date: 04/30/2020 CLINICAL DATA:  Nonspecific dizziness and headache. EXAM: CT ANGIOGRAPHY HEAD AND NECK TECHNIQUE: Multidetector CT imaging of the head and neck was performed using the standard protocol during bolus administration of intravenous contrast. Multiplanar CT image reconstructions and MIPs were obtained to evaluate the vascular anatomy. Carotid stenosis measurements (when applicable) are obtained utilizing NASCET  criteria, using the distal internal carotid diameter as the denominator. CONTRAST:  55m OMNIPAQUE IOHEXOL 350 MG/ML SOLN COMPARISON:  08/01/2018 FINDINGS: CT HEAD FINDINGS Brain: Age related atrophy. Chronic small-vessel ischemic changes of the cerebral hemispheric white matter and pons. No sign of acute infarction, mass lesion, hemorrhage, hydrocephalus or extra-axial collection. Vascular: There is atherosclerotic calcification of the major vessels at the base of the brain. Skull: Negative Sinuses: Clear  Orbits: Normal Review of the MIP images confirms the above findings CTA NECK FINDINGS Aortic arch: Aortic atherosclerosis. Branching pattern is normal without origin stenosis. Right carotid system: Common carotid artery shows scattered plaque but is widely patent to the bifurcation. Extensive calcified plaque at the carotid bifurcation and ICA bulb. Minimal diameter in the ICA bulb is 2 mm. Compared to a more distal cervical ICA diameter of 5 mm, this indicates a 60% stenosis. Left carotid system: Common carotid artery shows scattered plaque but is patent to the bifurcation. Soft and calcified plaque at the carotid bifurcation and ICA bulb. Minimal diameter of the ICA bulb is 3 mm. Compared to a more distal cervical ICA diameter of 5 mm, this indicates a 40% stenosis. Calcification of the ICA just proximal to the skull base narrows the lumen to 2 mm, consistent with 60% stenosis in this location. Vertebral arteries: Atherosclerotic plaque at both vertebral artery origins. No stenosis at the left origin. 30-50% stenosis of the proximal right vertebral artery. Beyond that, both vertebral arteries are patent through the cervical region, with areas of scattered calcified plaque. 50% stenosis of the right vertebral artery at the C3 level. Skeleton: Degenerative cervical spondylosis. Other neck: No mass or lymphadenopathy. Upper chest: Emphysema and pulmonary scarring. 12 mm mass lesion in the right upper lobe, further  enlarged when compared to a CT of 10/20/2019. This is quite likely to represent lung carcinoma. Review of the MIP images confirms the above findings CTA HEAD FINDINGS Anterior circulation: The internal carotid arteries are patent through the skull base and siphon regions. Extensive atherosclerotic calcification in both carotid siphon regions with stenosis estimated at 50% on both sides. Supraclinoid internal carotid arteries are widely patent. The anterior and middle cerebral vessels are patent. No large or medium vessel occlusion or correctable proximal stenosis. Posterior circulation: Both vertebral arteries are patent at the foramen magnum. On the right, there is occlusion just beyond PICA. On the left, there is occlusion just beyond PICA. The distal basilar artery is reconstituted by patent posterior communicating arteries. Flow is seen within both posterior cerebral arteries and both superior cerebellar arteries. Similar appearance to the study of July 2020. Venous sinuses: Patent Anatomic variants: None other significant. Review of the MIP images confirms the above findings IMPRESSION: 1. Extensive calcified plaque at both carotid bifurcations and ICA bulb regions. 60% stenosis of the ICA bulb on the right. 40% stenosis of the ICA bulb on the left. 60% stenosis of the left cervical ICA just proximal to the skull base. 2. 30-50% stenosis of the proximal right vertebral artery. 50% stenosis of the right vertebral artery at the C3 level. 3. Extensive atherosclerotic calcification in both carotid siphon regions with stenosis estimated at 50% on both sides. 4. No acute intracranial anterior circulation large or medium vessel occlusion or correctable proximal stenosis. 5. 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 Reconstitution of the distal basilar artery from patent posterior communicating arteries. 6. 12 mm mass lesion in the right upper lobe, further enlarged when compared to a CT of  10/20/2019. This is quite likely to represent lung carcinoma. 7. Emphysema and aortic atherosclerosis. Aortic Atherosclerosis (ICD10-I70.0) and Emphysema (ICD10-J43.9). Electronically Signed   By: Nelson Chimes M.D.   On: 04/30/2020 18:58   CT Angio Neck W and/or Wo Contrast  Result Date: 04/30/2020 CLINICAL DATA:  Nonspecific dizziness and headache. EXAM: CT ANGIOGRAPHY HEAD AND NECK TECHNIQUE: Multidetector CT imaging of the head and neck was performed using the standard  protocol during bolus administration of intravenous contrast. Multiplanar CT image reconstructions and MIPs were obtained to evaluate the vascular anatomy. Carotid stenosis measurements (when applicable) are obtained utilizing NASCET criteria, using the distal internal carotid diameter as the denominator. CONTRAST:  71m OMNIPAQUE IOHEXOL 350 MG/ML SOLN COMPARISON:  08/01/2018 FINDINGS: CT HEAD FINDINGS Brain: Age related atrophy. Chronic small-vessel ischemic changes of the cerebral hemispheric white matter and pons. No sign of acute infarction, mass lesion, hemorrhage, hydrocephalus or extra-axial collection. Vascular: There is atherosclerotic calcification of the major vessels at the base of the brain. Skull: Negative Sinuses: Clear Orbits: Normal Review of the MIP images confirms the above findings CTA NECK FINDINGS Aortic arch: Aortic atherosclerosis. Branching pattern is normal without origin stenosis. Right carotid system: Common carotid artery shows scattered plaque but is widely patent to the bifurcation. Extensive calcified plaque at the carotid bifurcation and ICA bulb. Minimal diameter in the ICA bulb is 2 mm. Compared to a more distal cervical ICA diameter of 5 mm, this indicates a 60% stenosis. Left carotid system: Common carotid artery shows scattered plaque but is patent to the bifurcation. Soft and calcified plaque at the carotid bifurcation and ICA bulb. Minimal diameter of the ICA bulb is 3 mm. Compared to a more distal  cervical ICA diameter of 5 mm, this indicates a 40% stenosis. Calcification of the ICA just proximal to the skull base narrows the lumen to 2 mm, consistent with 60% stenosis in this location. Vertebral arteries: Atherosclerotic plaque at both vertebral artery origins. No stenosis at the left origin. 30-50% stenosis of the proximal right vertebral artery. Beyond that, both vertebral arteries are patent through the cervical region, with areas of scattered calcified plaque. 50% stenosis of the right vertebral artery at the C3 level. Skeleton: Degenerative cervical spondylosis. Other neck: No mass or lymphadenopathy. Upper chest: Emphysema and pulmonary scarring. 12 mm mass lesion in the right upper lobe, further enlarged when compared to a CT of 10/20/2019. This is quite likely to represent lung carcinoma. Review of the MIP images confirms the above findings CTA HEAD FINDINGS Anterior circulation: The internal carotid arteries are patent through the skull base and siphon regions. Extensive atherosclerotic calcification in both carotid siphon regions with stenosis estimated at 50% on both sides. Supraclinoid internal carotid arteries are widely patent. The anterior and middle cerebral vessels are patent. No large or medium vessel occlusion or correctable proximal stenosis. Posterior circulation: Both vertebral arteries are patent at the foramen magnum. On the right, there is occlusion just beyond PICA. On the left, there is occlusion just beyond PICA. The distal basilar artery is reconstituted by patent posterior communicating arteries. Flow is seen within both posterior cerebral arteries and both superior cerebellar arteries. Similar appearance to the study of July 2020. Venous sinuses: Patent Anatomic variants: None other significant. Review of the MIP images confirms the above findings IMPRESSION: 1. Extensive calcified plaque at both carotid bifurcations and ICA bulb regions. 60% stenosis of the ICA bulb on the  right. 40% stenosis of the ICA bulb on the left. 60% stenosis of the left cervical ICA just proximal to the skull base. 2. 30-50% stenosis of the proximal right vertebral artery. 50% stenosis of the right vertebral artery at the C3 level. 3. Extensive atherosclerotic calcification in both carotid siphon regions with stenosis estimated at 50% on both sides. 4. No acute intracranial anterior circulation large or medium vessel occlusion or correctable proximal stenosis. 5. 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 Reconstitution of  the distal basilar artery from patent posterior communicating arteries. 6. 12 mm mass lesion in the right upper lobe, further enlarged when compared to a CT of 10/20/2019. This is quite likely to represent lung carcinoma. 7. Emphysema and aortic atherosclerosis. Aortic Atherosclerosis (ICD10-I70.0) and Emphysema (ICD10-J43.9). Electronically Signed   By: Nelson Chimes M.D.   On: 04/30/2020 18:58   DG Chest Portable 1 View  Result Date: 04/30/2020 CLINICAL DATA:  Syncope, history of laryngeal cancer, history of tobacco abuse EXAM: PORTABLE CHEST 1 VIEW COMPARISON:  02/22/2020 FINDINGS: 2 frontal views of the chest demonstrate an unremarkable cardiac silhouette. No acute airspace disease, effusion, or pneumothorax. No acute bony abnormalities. IMPRESSION: 1. No acute intrathoracic process. Electronically Signed   By: Randa Ngo M.D.   On: 04/30/2020 18:03       Assessment & Plan:    Active Problems:   Hyponatremia   Moderate protein-calorie malnutrition (HCC)   Hypokalemia   Syncope   Generalized weakness   1. Syncope 1. History consistent with orthostatic syncope 2. Hold Norvasc 3. Consider adding midodrine if symptoms continue? 4. Hydrate 5. If symptoms continue after adequate hydration neuro has recommended EEG and MRI 6. Monitor on telemetry 2. Dehydration 1. With mild hyponatremia at 130 and orthostatic syncope 2. Most likely secondary  to p.o. intake 3. Patient denies any further GI losses (as he had with last hospitalization) 4. Continue IV fluids 3. Hyponatremia/hypochloremia/hypokalemia 1. Most likely secondary to poor p.o. intake 2. Replace and recheck 3. Check mag in the a.m. 4. Continue to monitor 4. Moderate protein calorie malnutrition 1. Most likely secondary to poor p.o. intake 2. Add protein shake 5. Generalized weakness 1. PT eval and treat   DVT Prophylaxis-   Heparin and SCDs  AM Labs Ordered, also please review Full Orders  Family Communication: Admission, patients condition and plan of care including tests being ordered have been discussed with the patient and wife who indicate understanding and agree with the plan and Code Status.  Code Status: Full  Admission status: ObservationTime spent in minutes : [place]

## 2020-05-01 NOTE — Evaluation (Signed)
Physical Therapy Evaluation Patient Details Name: [name] MRN: [id number] DOB: October 08, 1960 Today's Date: 05/01/2020   History of Present Illness  [name]  is a 60 y.o. 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  Patient reports that he has been having syncopal events for 1 month.  They are getting more frequent.  He is not sure how long he was out when it happens.  His wife is at bedside and reports that he is out for 3 or 4 minutes.  He reports that he feels like he is spinning in the back of his neck hurts before he passes out.  He denies any chest pain, palpitations, dyspnea associated with these events.  He reports he has had 3 syncopal episodes today.  Patient does admit that he has had a decrease in appetite, but has been placed on Marinol in the outpatient setting and that has been helping.  He reports that he drinks half a gallon of water a day.  He eats 2 or 3 meals a day but reports that he is really more of a grazer.  He denies any fevers.  Patient reports that he was told to stop taking blood pressure medications, and did not know until today and that his Norvasc is one of his blood pressure medications.  He is been continuing to take it.  The syncopal events seem to happen mostly when he goes from sitting to standing.  He reports when he feels his preceding symptoms, he tries to make it to a chair, and if he is able to sit he does not pass out and his symptoms resolved.  His wife reports that he has occasionally had events when he is seated, where his whole body becomes stiff and his arm stick out.  When he awakes from these episodes he is immediately oriented with no suspicion for postictal state.  Patient reports that today one of his syncopal episodes occurred from standing and he did fall and hit the  back of his head.  He reports no pain in the back of his head now.  He does still have the neck pain that is like a dull ache.  This pain has been present since the syncopal episode started a month ago.   Clinical Impression   Patient exhibits generalized weakness and BP fluctuations with positional change ranging from: Supine: 129/85 mmHg, 54 bpm Sitting: 101/80 mmHg, 58 bpm Standing: 65/57 mmHg, 67 bpm Patient reports dizziness and feeling weakness/unsteadiness with each position change which greatly limited his activity tolerance requiring return to supine following mobility assessment. Able to complete supine there ex without incident or adverse effects. Continued tx whilst hospitalized indicated to improve strength and activity tolerance.     Follow Up Recommendations Home health PT;Supervision for mobility/OOB;Supervision/Assistance - 24 hour    Equipment Recommendations  None recommended by PT    Recommendations for Other Services       Precautions / Restrictions Precautions Precautions: Fall Restrictions Weight Bearing Restrictions: No      Mobility  Bed Mobility Overal bed mobility: Independent       Supine to sit: Independent       Patient Response: Cooperative  Transfers Overall transfer level: Needs assistance Equipment used: None;Rolling walker (2 wheeled) Transfers: Sit to/from [facility] Sit to Stand: Supervision Stand pivot transfers: Supervision;Min guard  General transfer comment: very unsteady without AD, requires use of RW for safety  Ambulation/Gait Ambulation/Gait assistance: Min guard;Min assist Gait Distance (Feet): 15 Feet Assistive device: Rolling walker (2 wheeled);None Gait Pattern/deviations: Decreased step length - right;Decreased step length - left;Decreased stride length;Scissoring Gait velocity: decreased   General Gait Details: slow labored unsteady cadence with scissoring of legs when making turns with  near fall, excessive BLE internal rotation, required use of RW for safety demonstrating slow labored cadence without loss of balance using walker  Stairs            Wheelchair Mobility    Modified Rankin (Stroke Patients Only)       Balance Overall balance assessment: Needs assistance Sitting-balance support: Feet supported;No upper extremity supported Sitting balance-Leahy Scale: Good Sitting balance - Comments: seated at EOB   Standing balance support: During functional activity;No upper extremity supported Standing balance-Leahy Scale: Poor Standing balance comment: fair/poor without AD, fair using RW             High level balance activites: Side stepping               Pertinent Vitals/Pain Pain Assessment: No/denies pain    Home Living Family/patient expects to be discharged to:: Private residence Living Arrangements: Spouse/significant other Available Help at Discharge: Family;Available 24 hours/day Type of Home: Mobile home Home Access: Ramped entrance     Home Layout: One level Home Equipment: [place] - 2 wheels;Cane - single point;Bedside commode;Shower seat      Prior Function Level of Independence: Independent with assistive device(s)         Comments: household and short distanced community ambulator using SPC, drives     Hand Dominance   Dominant Hand: Right    Extremity/Trunk Assessment   Upper Extremity Assessment Upper Extremity Assessment: Defer to OT evaluation    Lower Extremity Assessment Lower Extremity Assessment: Generalized weakness    Cervical / Trunk Assessment Cervical / Trunk Assessment: Normal  Communication   Communication: No difficulties  Cognition Arousal/Alertness: Awake/alert Behavior During Therapy: WFL for tasks assessed/performed Overall Cognitive Status: Within Functional Limits for tasks assessed                                        General Comments      Exercises  General Exercises - Lower Extremity Ankle Circles/Pumps: AROM;20 reps Quad Sets: Strengthening;20 reps Long Arc Quad: AROM;Both;20 reps   Assessment/Plan    PT Assessment Patient needs continued PT services  PT Problem List Decreased strength;Decreased activity tolerance;Decreased balance;Decreased mobility       PT Treatment Interventions DME instruction;Gait training;Functional mobility training;Therapeutic activities;Patient/family education;Neuromuscular re-education;Balance training;Therapeutic exercise;Manual techniques    PT Goals (Current goals can be found in the Care Plan section)  Acute Rehab PT Goals Patient Stated Goal: return home with family to assist PT Goal Formulation: With patient/family Time For Goal Achievement: 05/08/20 Potential to Achieve Goals: Good    Frequency Min 3X/week   Barriers to discharge        Co-evaluation               AM-PAC PT "6 Clicks" Mobility  Outcome Measure Help needed turning from your back to your side while in a flat bed without using bedrails?: None Help needed moving from lying on your back to sitting on the side of a flat bed without using bedrails?: None Help needed moving  to and from a bed to a chair (including a wheelchair)?: A Little Help needed standing up from a chair using your arms (e.g., wheelchair or bedside chair)?: A Little Help needed to walk in hospital room?: A Little Help needed climbing 3-5 steps with a railing? : A Lot 6 Click Score: 19    End of Session Equipment Utilized During Treatment: Gait belt Activity Tolerance: Patient tolerated treatment well;Patient limited by fatigue Patient left: with call bell/phone within reach;in bed Nurse Communication: Mobility status PT Visit Diagnosis: Unsteadiness on feet (R26.81);Other abnormalities of gait and mobility (R26.89);Muscle weakness (generalized) (M62.81);Difficulty in walking, not elsewhere classified (R26.2)    Time: 0900-0930 PT Time  Calculation (min) (ACUTE ONLY): 30 min   Charges:   PT Evaluation $PT Eval Low Complexity: 1 Low PT Treatments $Therapeutic Exercise: 8-22 mins $Therapeutic Activity: 8-22 mins        10:57 AM, 05/01/20 M. Sherlyn Lees, PT, DPT Physical Therapist- [place] Office Number: [phone]

## 2020-05-01 NOTE — Plan of Care (Signed)
  Problem: Acute Rehab PT Goals(only PT should resolve) Goal: Patient Will Transfer Sit To/From Stand Outcome: Progressing Flowsheets (Taken 05/01/2020 1058) Patient will transfer sit to/from stand: with modified independence Goal: Pt Will Transfer Bed To Chair/Chair To Bed Outcome: Progressing Flowsheets (Taken 05/01/2020 1058) Pt will Transfer Bed to Chair/Chair to Bed: with modified independence Goal: Pt Will Ambulate Outcome: Progressing Flowsheets (Taken 05/01/2020 1058) Pt will Ambulate:  50 feet  with supervision  with rolling walker Note: Maintaining stable vital signs Goal: Pt/caregiver will Perform Home Exercise Program Outcome: Progressing Flowsheets (Taken 05/01/2020 1058) Pt/caregiver will Perform Home Exercise Program:  For increased strengthening  For improved balance  With Supervision, verbal cues required/provided   10:59 AM, 05/01/20 M. Sherlyn Lees, PT, DPT Physical Therapist- [place] Office Number: [phone]

## 2020-05-02 DIAGNOSIS — E440 Moderate protein-calorie malnutrition: Secondary | ICD-10-CM

## 2020-05-02 DIAGNOSIS — E876 Hypokalemia: Secondary | ICD-10-CM | POA: Diagnosis not present

## 2020-05-02 DIAGNOSIS — R531 Weakness: Secondary | ICD-10-CM | POA: Diagnosis not present

## 2020-05-02 DIAGNOSIS — I951 Orthostatic hypotension: Secondary | ICD-10-CM | POA: Diagnosis not present

## 2020-05-02 LAB — BASIC METABOLIC PANEL
Anion gap: 5 (ref 5–15)
BUN: <5 mg/dL — ABNORMAL LOW (ref 6–20)
CO2: 27 mmol/L (ref 22–32)
Calcium: 7.7 mg/dL — ABNORMAL LOW (ref 8.9–10.3)
Chloride: 104 mmol/L (ref 98–111)
Creatinine, Ser: 0.6 mg/dL — ABNORMAL LOW (ref 0.61–1.24)
GFR, Estimated: >60 mL/min (ref >60)
Glucose, Bld: 77 mg/dL (ref 70–99)
Potassium: 3.6 mmol/L (ref 3.5–5.1)
Sodium: 136 mmol/L (ref 135–145)

## 2020-05-02 LAB — GLUCOSE, CAPILLARY
Glucose-Capillary: 86 mg/dL (ref 70–99)
Glucose-Capillary: 85 mg/dL (ref 70–99)

## 2020-05-02 LAB — MAGNESIUM: Magnesium: 2.1 mg/dL (ref 1.7–2.4)

## 2020-05-02 MED ORDER — ACETAMINOPHEN 325 MG PO TABS: 500.0000 mg | ORAL_TABLET | Freq: Four times a day (QID) | ORAL | Status: DC | PRN

## 2020-05-02 MED ORDER — ENSURE ENLIVE PO LIQD: 237.0000 mL | Freq: Two times a day (BID) | ORAL | 0 refills | Status: AC

## 2020-05-02 NOTE — TOC Transition Note (Signed)
Transition of Care [facility]) - CM/SW Discharge Note   Patient Details  Name: [name] MRN: [id number] Date of Birth: 12/18/1960  [facility] CM/SW Contact:  Ova Freshwater Phone Number: [phone] 05/02/2020, 1:29 PM   Clinical Narrative:     Patient will d/c home with home health PT provided by [facility], conirmed by Marjory Lies.  Patient will have DM, 3 in 1 bedside commode, delivered by [facility].        Patient Goals and CMS Choice        Discharge Placement                       Discharge Plan and Services                                     Social Determinants of Health (SDOH) Interventions     Readmission Risk Interventions Readmission Risk Prevention Plan 08/13/2019 08/13/2019 04/10/2019  Transportation Screening - Complete Complete  Medication Review [employer]) - Complete Complete  PCP or Specialist appointment within 3-5 days of discharge (No Data) - -  [place]RI or [place] - Complete Complete  Palliative Care Screening - Not Applicable Not Applicable  [place] - Not Applicable Not Applicable  Some recent data might be hidden

## 2020-05-02 NOTE — Discharge Instructions (Signed)
Orthostatic Hypotension Blood pressure is a measurement of how strongly, or weakly, your blood is pressing against the walls of your arteries. Orthostatic hypotension is a sudden drop in blood pressure that happens when you quickly change positions, such as when you get up from sitting or lying down. Arteries are blood vessels that carry blood from your heart throughout your body. When blood pressure is too low, you may not get enough blood to your brain or to the rest of your organs. This can cause weakness, light-headedness, rapid heartbeat, and fainting. This can last for just a few seconds or for up to a few minutes. Orthostatic hypotension is usually not a serious problem. However, if it happens frequently or gets worse, it may be a sign of something more serious. What are the causes? This condition may be caused by:  Sudden changes in posture, such as standing up quickly after you have been sitting or lying down.  Blood loss.  Loss of body fluids (dehydration).  Heart problems.  Hormone (endocrine) problems.  Pregnancy.  Severe infection.  Lack of certain nutrients.  Severe allergic reactions (anaphylaxis).  Certain medicines, such as blood pressure medicine or medicines that make the body lose excess fluids (diuretics). Sometimes, this condition can be caused by not taking medicine as directed, such as taking too much of a certain medicine. What increases the risk? The following factors may make you more likely to develop this condition:  Age. Risk increases as you get older.  Conditions that affect the heart or the central nervous system.  Taking certain medicines, such as blood pressure medicine or diuretics.  Being pregnant. What are the signs or symptoms? Symptoms of this condition may include:  Weakness.  Light-headedness.  Dizziness.  Blurred vision.  Fatigue.  Rapid heartbeat.  Fainting, in severe cases. How is this diagnosed? This condition is  diagnosed based on:  Your medical history.  Your symptoms.  Your blood pressure measurement. Your health care provider will check your blood pressure when you are: ? Lying down. ? Sitting. ? Standing. A blood pressure reading is recorded as two numbers, such as "120 over 80" (or 120/80). The first ("top") number is called the systolic pressure. It is a measure of the pressure in your arteries as your heart beats. The second ("bottom") number is called the diastolic pressure. It is a measure of the pressure in your arteries when your heart relaxes between beats. Blood pressure is measured in a unit called mm Hg. Healthy blood pressure for most adults is 120/80. If your blood pressure is below 90/60, you may be diagnosed with hypotension. Other information or tests that may be used to diagnose orthostatic hypotension include:  Your other vital signs, such as your heart rate and temperature.  Blood tests.  Tilt table test. For this test, you will be safely secured to a table that moves you from a lying position to an upright position. Your heart rhythm and blood pressure will be monitored during the test. How is this treated? This condition may be treated by:  Changing your diet. This may involve eating more salt (sodium) or drinking more water.  Taking medicines to raise your blood pressure.  Changing the dosage of certain medicines you are taking that might be lowering your blood pressure.  Wearing compression stockings. These stockings help to prevent blood clots and reduce swelling in your legs. In some cases, you may need to go to the hospital for:  Fluid replacement. This means you will  receive fluids through an IV.  Blood replacement. This means you will receive donated blood through an IV (transfusion).  Treating an infection or heart problems, if this applies.  Monitoring. You may need to be monitored while medicines that you are taking wear off. Follow these instructions  at home: Eating and drinking  Drink enough fluid to keep your urine pale yellow.  Eat a healthy diet, and follow instructions from your health care provider about eating or drinking restrictions. A healthy diet includes: ? Fresh fruits and vegetables. ? Whole grains. ? Lean meats. ? Low-fat dairy products.  Eat extra salt only as directed. Do not add extra salt to your diet unless your health care provider told you to do that.  Eat frequent, small meals.  Avoid standing up suddenly after eating.   Medicines  Take over-the-counter and prescription medicines only as told by your health care provider. ? Follow instructions from your health care provider about changing the dosage of your current medicines, if this applies. ? Do not stop or adjust any of your medicines on your own. General instructions  Wear compression stockings as told by your health care provider.  Get up slowly from lying down or sitting positions. This gives your blood pressure a chance to adjust.  Avoid hot showers and excessive heat as directed by your health care provider.  Return to your normal activities as told by your health care provider. Ask your health care provider what activities are safe for you.  Do not use any products that contain nicotine or tobacco, such as cigarettes, e-cigarettes, and chewing tobacco. If you need help quitting, ask your health care provider.  Keep all follow-up visits as told by your health care provider. This is important.   Contact a health care provider if you:  Vomit.  Have diarrhea.  Have a fever for more than 2-3 days.  Feel more thirsty than usual.  Feel weak and tired. Get help right away if you:  Have chest pain.  Have a fast or irregular heartbeat.  Develop numbness in any part of your body.  Cannot move your arms or your legs.  Have trouble speaking.  Become sweaty or feel light-headed.  Faint.  Feel short of breath.  Have trouble staying  awake.  Feel confused. Summary  Orthostatic hypotension is a sudden drop in blood pressure that happens when you quickly change positions.  Orthostatic hypotension is usually not a serious problem.  It is diagnosed by having your blood pressure taken lying down, sitting, and then standing.  It may be treated by changing your diet or adjusting your medicines. This information is not intended to replace advice given to you by your health care provider. Make sure you discuss any questions you have with your health care provider. Document Revised: 06/14/2017 Document Reviewed: 06/14/2017 Elsevier Patient Education  [place].    IMPORTANT INFORMATION: PAY CLOSE ATTENTION   PHYSICIAN DISCHARGE INSTRUCTIONS  Follow with Primary care provider  [name], MD  and other consultants as instructed by your Hospitalist Physician  [place] IF SYMPTOMS COME BACK, WORSEN OR NEW PROBLEM DEVELOPS   Please note: You were cared for by a hospitalist during your hospital stay. Every effort will be made to forward records to your primary care provider.  You can request that your primary care provider send for your hospital records if they have not received them.  Once you are discharged, your primary care physician  will handle any further medical issues. Please note that NO REFILLS for any discharge medications will be authorized once you are discharged, as it is imperative that you return to your primary care physician (or establish a relationship with a primary care physician if you do not have one) for your post hospital discharge needs so that they can reassess your need for medications and monitor your lab values.  Please get a complete blood count and chemistry panel checked by your Primary MD at your next visit, and again as instructed by your Primary MD.  Get Medicines reviewed and adjusted: Please take all your medications with you for your next  visit with your Primary MD  Laboratory/radiological data: Please request your Primary MD to go over all hospital tests and procedure/radiological results at the follow up, please ask your primary care provider to get all Hospital records sent to his/her office.  In some cases, they will be blood work, cultures and biopsy results pending at the time of your discharge. Please request that your primary care provider follow up on these results.  If you are diabetic, please bring your blood sugar readings with you to your follow up appointment with primary care.    Please call and make your follow up appointments as soon as possible.    Also Note the following: If you experience worsening of your admission symptoms, develop shortness of breath, life threatening emergency, suicidal or homicidal thoughts you must seek medical attention immediately by calling 911 or calling your MD immediately  if symptoms less severe.  You must read complete instructions/literature along with all the possible adverse reactions/side effects for all the Medicines you take and that have been prescribed to you. Take any new Medicines after you have completely understood and accpet all the possible adverse reactions/side effects.   Do not drive when taking Pain medications or sleeping medications (Benzodiazepines)  Do not take more than prescribed Pain, Sleep and Anxiety Medications. It is not advisable to combine anxiety,sleep and pain medications without talking with your primary care practitioner  Special Instructions: If you have smoked or chewed Tobacco  in the last 2 yrs please stop smoking, stop any regular Alcohol  and or any Recreational drug use.  Wear Seat belts while driving.  Do not drive if taking any narcotic, mind altering or controlled substances or recreational drugs or alcohol.

## 2020-05-02 NOTE — Discharge Summary (Signed)
Physician Discharge Summary  [name] NTI:[id number] DOB: 11-25-1960 DOA: 04/30/2020  PCP: [name], MD  Admit date: 04/30/2020 Discharge date: 05/02/2020  Admitted From:  Home  Disposition:  Home   Recommendations for Outpatient Follow-up:  1. Follow up with PCP in 1 weeks 2. Follow up with neurology in 2 weeks  3. Please obtain BMP in one weeks to check electrolytes 4. Consider outpatient EEG study.    Home Health:  PT   Discharge Condition: STABLE   CODE STATUS: FULL  DIET: previous home diet, avoiding sodas   Brief Hospitalization Summary: Please see all hospital notes, images, labs for full details of the hospitalization. ADMISSION HPI:  60 y.o. 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  Patient reports that he has been having syncopal events for 1 month.  They are getting more frequent.  He is not sure how long he was out when it happens.  His wife is at bedside and reports that he is out for 3 or 4 minutes.  He reports that he feels like he is spinning in the back of his neck hurts before he passes out.  He denies any chest pain, palpitations, dyspnea associated with these events.  He reports he has had 3 syncopal episodes today.  Patient does admit that he has had a decrease in appetite, but has been placed on Marinol in the outpatient setting and that has been helping.  He reports that he drinks half a gallon of water a day.  He eats 2 or 3 meals a day but reports that he is really more of a grazer.  He denies any fevers.  Patient reports that he was told to stop taking blood pressure medications, and did not know until today and that his Norvasc is one of his blood pressure medications.  He is been continuing to take it.  The syncopal events seem to happen mostly when he goes from sitting to  standing.  He reports when he feels his preceding symptoms, he tries to make it to a chair, and if he is able to sit he does not pass out and his symptoms resolved.  His wife reports that he has occasionally had events when he is seated, where his whole body becomes stiff and his arm stick out.  When he awakes from these episodes he is immediately oriented with no suspicion for postictal state.  Patient reports that today one of his syncopal episodes occurred from standing and he did fall and hit the back of his head.  He reports no pain in the back of his head now.  He does still have the neck pain that is like a dull ache.  This pain has been present since the syncopal episode started a month ago.  In the ED Temp 97.7, heart rate 50-54, respiratory 10-16, blood pressure 167/88, satting 100% No leukocytosis with a white blood cell count of 5.1, hemoglobin 11.7 Chemistry panel reveals a hypokalemia at 3.3 Alk phos elevated at 733 AST minimally elevated at 49 CTA head and neck shows extensive calcified plaque at both carotid bifurcations and ICA bulb regions.  60% stenosis of the ICA bulb on the right, 40% stenosis of the ICA bulb on the left.  60% stenosis of the left cervical ICA just proximal to the skull base.  30 to 50% stenosis of  the proximal right vertebral artery.  50% stenosis of the right vertebral artery at the C3 level.  Extensive atherosclerotic calcification in both carotid siphon regions with stenosis estimated at 50%.  No acute intracranial anterior circulation large or medium vessel occlusion or correctable proximal stenosis. Chest x-ray shows no acute abnormalities Dr. Leonel Ramsay was consulted and recommends admission for hydration, and if he continues to have more episodes he may need an EEG and an MRI.  2 L bolus in ED.   Admission requested for was likely orthostatic syncope.    Hospital Course   Pt was admitted for dehydration and recurrent episodes of orthostatic hypotension.   He was found to be dehydrated with hyponatremia and had electrolyte abnormalities which have been corrected.  He was seen by PT and they have recommended home health PT.  He has had no recurrence of these episodes.  We spoke with neurology and the recommendation was to get an EEG and MRI if he has further episodes after his dehydration was corrected.  He had not further episodes.  He is safe to discharge home and have outpatient follow up with his neurologist and PCP.  I have recommended he see his PCP, oncologist and neurologist in follow up and have repeat labs and get EEG and MRI outpatient if necessary.    Discharge Diagnoses:  Active Problems:   Hyponatremia   Moderate protein-calorie malnutrition (HCC)   Hypokalemia   Syncope   Generalized weakness   Orthostasis   Orthostatic hypotension   Discharge Instructions:  Allergies as of 05/02/2020      Reactions   Duloxetine Other (See Comments)   Caused depression/ patient is taking   Prednisone Other (See Comments)   Chest pain      Medication List    STOP taking these medications   amLODipine 5 MG tablet Commonly known as: NORVASC   diazepam 10 MG tablet Commonly known as: VALIUM   diphenoxylate-atropine 2.5-0.025 MG tablet Commonly known as: LOMOTIL   loperamide 2 MG capsule Commonly known as: IMODIUM   omeprazole 40 MG capsule Commonly known as: PRILOSEC   ondansetron 4 MG tablet Commonly known as: ZOFRAN   potassium chloride 10 MEQ tablet Commonly known as: KLOR-CON   traMADol 50 MG tablet Commonly known as: ULTRAM     TAKE these medications   acetaminophen 325 MG tablet Commonly known as: TYLENOL Take 1.5 tablets (487.5 mg total) by mouth every 6 (six) hours as needed for mild pain, fever or headache (or Fever >/= 101). What changed: how much to take   aspirin EC 81 MG tablet Take 1 tablet (81 mg total) by mouth daily with breakfast.   atorvastatin 40 MG tablet Commonly known as: LIPITOR Take 80 mg by  mouth at bedtime.   dronabinol 5 MG capsule Commonly known as: MARINOL Take 1 capsule (5 mg total) by mouth 2 (two) times daily before a meal.   feeding supplement Liqd Take 237 mLs by mouth 2 (two) times daily between meals.   HYDROcodone-acetaminophen 10-325 MG tablet Commonly known as: NORCO Take 1 tablet by mouth every 4 (four) hours as needed.   metoprolol tartrate 25 MG tablet Commonly known as: LOPRESSOR Take 12.5 mg by mouth 2 (two) times daily.   mirtazapine 7.5 MG tablet Commonly known as: REMERON Take 1 tablet (7.5 mg total) by mouth at bedtime.   nitroGLYCERIN 0.4 MG SL tablet Commonly known as: NITROSTAT Place 1 tablet (0.4 mg total) under the tongue every 5 (five) minutes  as needed for chest pain (CP or SOB).   potassium chloride SA 20 MEQ tablet Commonly known as: KLOR-CON Take 20 mEq by mouth daily.   prochlorperazine 10 MG tablet Commonly known as: COMPAZINE TAKE 1 TABLET(10 MG) BY MOUTH EVERY 6 HOURS AS NEEDED FOR NAUSEA What changed: See the new instructions.       Follow-up Information    Frann Rider, NP. Schedule an appointment as soon as possible for a visit in 2 week(s).   Specialty: Neurology Why: Hospital Follow Up  Contact information: [phone] [address] [place] [address] [phone]        [name], MD. Schedule an appointment as soon as possible for a visit in 1 week(s).   Specialty: Internal Medicine Why: Hospital Follow Up  Contact information: [address] [place] [address] [phone]              Allergies  Allergen Reactions  . Duloxetine Other (See Comments)    Caused depression/ patient is taking  . Prednisone Other (See Comments)    Chest pain   Allergies as of 05/02/2020      Reactions   Duloxetine Other (See Comments)   Caused depression/ patient is taking   Prednisone Other (See Comments)   Chest pain      Medication List    STOP taking these medications   amLODipine 5 MG  tablet Commonly known as: NORVASC   diazepam 10 MG tablet Commonly known as: VALIUM   diphenoxylate-atropine 2.5-0.025 MG tablet Commonly known as: LOMOTIL   loperamide 2 MG capsule Commonly known as: IMODIUM   omeprazole 40 MG capsule Commonly known as: PRILOSEC   ondansetron 4 MG tablet Commonly known as: ZOFRAN   potassium chloride 10 MEQ tablet Commonly known as: KLOR-CON   traMADol 50 MG tablet Commonly known as: ULTRAM     TAKE these medications   acetaminophen 325 MG tablet Commonly known as: TYLENOL Take 1.5 tablets (487.5 mg total) by mouth every 6 (six) hours as needed for mild pain, fever or headache (or Fever >/= 101). What changed: how much to take   aspirin EC 81 MG tablet Take 1 tablet (81 mg total) by mouth daily with breakfast.   atorvastatin 40 MG tablet Commonly known as: LIPITOR Take 80 mg by mouth at bedtime.   dronabinol 5 MG capsule Commonly known as: MARINOL Take 1 capsule (5 mg total) by mouth 2 (two) times daily before a meal.   feeding supplement Liqd Take 237 mLs by mouth 2 (two) times daily between meals.   HYDROcodone-acetaminophen 10-325 MG tablet Commonly known as: NORCO Take 1 tablet by mouth every 4 (four) hours as needed.   metoprolol tartrate 25 MG tablet Commonly known as: LOPRESSOR Take 12.5 mg by mouth 2 (two) times daily.   mirtazapine 7.5 MG tablet Commonly known as: REMERON Take 1 tablet (7.5 mg total) by mouth at bedtime.   nitroGLYCERIN 0.4 MG SL tablet Commonly known as: NITROSTAT Place 1 tablet (0.4 mg total) under the tongue every 5 (five) minutes as needed for chest pain (CP or SOB).   potassium chloride SA 20 MEQ tablet Commonly known as: KLOR-CON Take 20 mEq by mouth daily.   prochlorperazine 10 MG tablet Commonly known as: COMPAZINE TAKE 1 TABLET(10 MG) BY MOUTH EVERY 6 HOURS AS NEEDED FOR NAUSEA What changed: See the new instructions.       Procedures/Studies: CT Angio Head W or Wo  Contrast  Result Date: 04/30/2020 CLINICAL DATA:  Nonspecific dizziness and headache. EXAM: CT ANGIOGRAPHY HEAD AND NECK TECHNIQUE: Multidetector CT imaging of the head and neck was performed using the standard protocol during bolus administration of intravenous contrast. Multiplanar CT image reconstructions and MIPs were obtained to evaluate the vascular anatomy. Carotid stenosis measurements (when applicable) are obtained utilizing NASCET criteria, using the distal internal carotid diameter as the denominator. CONTRAST:  26mL OMNIPAQUE IOHEXOL 350 MG/ML SOLN COMPARISON:  08/01/2018 FINDINGS: CT HEAD FINDINGS Brain: Age related atrophy. Chronic small-vessel ischemic changes of the cerebral hemispheric white matter and pons. No sign of acute infarction, mass lesion, hemorrhage, hydrocephalus or extra-axial collection. Vascular: There is atherosclerotic calcification of the major vessels at the base of the brain. Skull: Negative Sinuses: Clear Orbits: Normal Review of the MIP images confirms the above findings CTA NECK FINDINGS Aortic arch: Aortic atherosclerosis. Branching pattern is normal without origin stenosis. Right carotid system: Common carotid artery shows scattered plaque but is widely patent to the bifurcation. Extensive calcified plaque at the carotid bifurcation and ICA bulb. Minimal diameter in the ICA bulb is 2 mm. Compared to a more distal cervical ICA diameter of 5 mm, this indicates a 60% stenosis. Left carotid system: Common carotid artery shows scattered plaque but is patent to the bifurcation. Soft and calcified plaque at the carotid bifurcation and ICA bulb. Minimal diameter of the ICA bulb is 3 mm. Compared to a more distal cervical ICA diameter of 5 mm, this indicates a 40% stenosis. Calcification of the ICA just proximal to the skull base narrows the lumen to 2 mm, consistent with 60% stenosis in this location. Vertebral arteries: Atherosclerotic plaque at both vertebral artery origins. No  stenosis at the left origin. 30-50% stenosis of the proximal right vertebral artery. Beyond that, both vertebral arteries are patent through the cervical region, with areas of scattered calcified plaque. 50% stenosis of the right vertebral artery at the C3 level. Skeleton: Degenerative cervical spondylosis. Other neck: No mass or lymphadenopathy. Upper chest: Emphysema and pulmonary scarring. 12 mm mass lesion in the right upper lobe, further enlarged when compared to a CT of 10/20/2019. This is quite likely to represent lung carcinoma. Review of the MIP images confirms the above findings CTA HEAD FINDINGS Anterior circulation: The internal carotid arteries are patent through the skull base and siphon regions. Extensive atherosclerotic calcification in both carotid siphon regions with stenosis estimated at 50% on both sides. Supraclinoid internal carotid arteries are widely patent. The anterior and middle cerebral vessels are patent. No large or medium vessel occlusion or correctable proximal stenosis. Posterior circulation: Both vertebral arteries are patent at the foramen magnum. On the right, there is occlusion just beyond PICA. On the left, there is occlusion just beyond PICA. The distal basilar artery is reconstituted by patent posterior communicating arteries. Flow is seen within both posterior cerebral arteries and both superior cerebellar arteries. Similar appearance to the study of July 2020. Venous sinuses: Patent Anatomic variants: None other significant. Review of the MIP images confirms the above findings IMPRESSION: 1. Extensive calcified plaque at both carotid bifurcations and ICA bulb regions. 60% stenosis of the ICA bulb on the right. 40% stenosis of the ICA bulb on the left. 60% stenosis of the left cervical ICA just proximal to the skull base. 2. 30-50% stenosis of the proximal right vertebral artery. 50% stenosis of the right vertebral artery at the C3 level. 3. Extensive atherosclerotic  calcification in both carotid siphon regions with stenosis estimated at 50% on both sides. 4. No acute  intracranial anterior circulation large or medium vessel occlusion or correctable proximal stenosis. 5. 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 Reconstitution of the distal basilar artery from patent posterior communicating arteries. 6. 12 mm mass lesion in the right upper lobe, further enlarged when compared to a CT of 10/20/2019. This is quite likely to represent lung carcinoma. 7. Emphysema and aortic atherosclerosis. Aortic Atherosclerosis (ICD10-I70.0) and Emphysema (ICD10-J43.9). Electronically Signed   By: Nelson Chimes M.D.   On: 04/30/2020 18:58   CT Angio Neck W and/or Wo Contrast  Result Date: 04/30/2020 CLINICAL DATA:  Nonspecific dizziness and headache. EXAM: CT ANGIOGRAPHY HEAD AND NECK TECHNIQUE: Multidetector CT imaging of the head and neck was performed using the standard protocol during bolus administration of intravenous contrast. Multiplanar CT image reconstructions and MIPs were obtained to evaluate the vascular anatomy. Carotid stenosis measurements (when applicable) are obtained utilizing NASCET criteria, using the distal internal carotid diameter as the denominator. CONTRAST:  44mL OMNIPAQUE IOHEXOL 350 MG/ML SOLN COMPARISON:  08/01/2018 FINDINGS: CT HEAD FINDINGS Brain: Age related atrophy. Chronic small-vessel ischemic changes of the cerebral hemispheric white matter and pons. No sign of acute infarction, mass lesion, hemorrhage, hydrocephalus or extra-axial collection. Vascular: There is atherosclerotic calcification of the major vessels at the base of the brain. Skull: Negative Sinuses: Clear Orbits: Normal Review of the MIP images confirms the above findings CTA NECK FINDINGS Aortic arch: Aortic atherosclerosis. Branching pattern is normal without origin stenosis. Right carotid system: Common carotid artery shows scattered plaque but is widely patent to the  bifurcation. Extensive calcified plaque at the carotid bifurcation and ICA bulb. Minimal diameter in the ICA bulb is 2 mm. Compared to a more distal cervical ICA diameter of 5 mm, this indicates a 60% stenosis. Left carotid system: Common carotid artery shows scattered plaque but is patent to the bifurcation. Soft and calcified plaque at the carotid bifurcation and ICA bulb. Minimal diameter of the ICA bulb is 3 mm. Compared to a more distal cervical ICA diameter of 5 mm, this indicates a 40% stenosis. Calcification of the ICA just proximal to the skull base narrows the lumen to 2 mm, consistent with 60% stenosis in this location. Vertebral arteries: Atherosclerotic plaque at both vertebral artery origins. No stenosis at the left origin. 30-50% stenosis of the proximal right vertebral artery. Beyond that, both vertebral arteries are patent through the cervical region, with areas of scattered calcified plaque. 50% stenosis of the right vertebral artery at the C3 level. Skeleton: Degenerative cervical spondylosis. Other neck: No mass or lymphadenopathy. Upper chest: Emphysema and pulmonary scarring. 12 mm mass lesion in the right upper lobe, further enlarged when compared to a CT of 10/20/2019. This is quite likely to represent lung carcinoma. Review of the MIP images confirms the above findings CTA HEAD FINDINGS Anterior circulation: The internal carotid arteries are patent through the skull base and siphon regions. Extensive atherosclerotic calcification in both carotid siphon regions with stenosis estimated at 50% on both sides. Supraclinoid internal carotid arteries are widely patent. The anterior and middle cerebral vessels are patent. No large or medium vessel occlusion or correctable proximal stenosis. Posterior circulation: Both vertebral arteries are patent at the foramen magnum. On the right, there is occlusion just beyond PICA. On the left, there is occlusion just beyond PICA. The distal basilar artery is  reconstituted by patent posterior communicating arteries. Flow is seen within both posterior cerebral arteries and both superior cerebellar arteries. Similar appearance to the study of July 2020.  Venous sinuses: Patent Anatomic variants: None other significant. Review of the MIP images confirms the above findings IMPRESSION: 1. Extensive calcified plaque at both carotid bifurcations and ICA bulb regions. 60% stenosis of the ICA bulb on the right. 40% stenosis of the ICA bulb on the left. 60% stenosis of the left cervical ICA just proximal to the skull base. 2. 30-50% stenosis of the proximal right vertebral artery. 50% stenosis of the right vertebral artery at the C3 level. 3. Extensive atherosclerotic calcification in both carotid siphon regions with stenosis estimated at 50% on both sides. 4. No acute intracranial anterior circulation large or medium vessel occlusion or correctable proximal stenosis. 5. 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 Reconstitution of the distal basilar artery from patent posterior communicating arteries. 6. 12 mm mass lesion in the right upper lobe, further enlarged when compared to a CT of 10/20/2019. This is quite likely to represent lung carcinoma. 7. Emphysema and aortic atherosclerosis. Aortic Atherosclerosis (ICD10-I70.0) and Emphysema (ICD10-J43.9). Electronically Signed   By: Nelson Chimes M.D.   On: 04/30/2020 18:58   DG Chest Portable 1 View  Result Date: 04/30/2020 CLINICAL DATA:  Syncope, history of laryngeal cancer, history of tobacco abuse EXAM: PORTABLE CHEST 1 VIEW COMPARISON:  02/22/2020 FINDINGS: 2 frontal views of the chest demonstrate an unremarkable cardiac silhouette. No acute airspace disease, effusion, or pneumothorax. No acute bony abnormalities. IMPRESSION: 1. No acute intrathoracic process. Electronically Signed   By: Randa Ngo M.D.   On: 04/30/2020 18:03      Subjective: Pt reports that he has been ambulating in the room.    He has been eating and drinking and he is not having diarrhea.    Discharge Exam: Vitals:   05/02/20 0421 05/02/20 0421  BP: (!) 154/88 (!) 154/88  Pulse: 65 63  Resp: 20 20  Temp: (!) 97.5 F (36.4 C) (!) 97.5 F (36.4 C)  SpO2: 99% 100%   Vitals:   05/01/20 1441 05/01/20 2152 05/02/20 0421 05/02/20 0421  BP: 132/88 (!) 113/96 (!) 154/88 (!) 154/88  Pulse: (!) 50 64 65 63  Resp: $Remo'20 18 20 20  'oBxyL$ Temp: (!) 97.5 F (36.4 C) (!) 97 F (36.1 C) (!) 97.5 F (36.4 C) (!) 97.5 F (36.4 C)  TempSrc: Oral     SpO2: 99% 97% 99% 100%  Weight:      Height:       General: Pt is alert, awake, not in acute distress Cardiovascular: RRR, S1/S2 +, no rubs, no gallops Respiratory: CTA bilaterally, no wheezing, no rhonchi Abdominal: Soft, NT, ND, bowel sounds + Extremities: no edema, no cyanosis Neurological: nonfocal exam.     The results of significant diagnostics from this hospitalization (including imaging, microbiology, ancillary and laboratory) are listed below for reference.     Microbiology: Recent Results (from the past 240 hour(s))  SARS CORONAVIRUS 2 (TAT 6-24 HRS) Nasopharyngeal Nasopharyngeal Swab     Status: None   Collection Time: 04/30/20  8:56 PM   Specimen: Nasopharyngeal Swab  Result Value Ref Range Status   SARS Coronavirus 2 NEGATIVE NEGATIVE Final    Comment: (NOTE) SARS-CoV-2 target nucleic acids are NOT DETECTED.  The SARS-CoV-2 RNA is generally detectable in upper and lower respiratory specimens during the acute phase of infection. Negative results do not preclude SARS-CoV-2 infection, do not rule out co-infections with other pathogens, and should not be used as the sole basis for treatment or other patient management decisions. Negative results must be combined  with clinical observations, patient history, and epidemiological information. The expected result is Negative.  Fact Sheet for Patients: SugarRoll.be  Fact Sheet for  Healthcare Providers: https://www.woods-mathews.com/  This test is not yet approved or cleared by the [place] FDA and  has been authorized for detection and/or diagnosis of SARS-CoV-2 by FDA under an Emergency Use Authorization (EUA). This EUA will remain  in effect (meaning this test can be used) for the duration of the COVID-19 declaration under Se ction 564(b)(1) of the Act, 21 U.S.C. section 360bbb-3(b)(1), unless the authorization is terminated or revoked sooner.  Performed at [facility], [place] [address], [place], [place] [address]      Labs: BNP (last 3 results) No results for input(s): BNP in the last 8760 hours. Basic Metabolic Panel: Recent Labs  Lab 04/30/20 1710 05/01/20 0508 05/02/20 0434  NA 130* 135 136  K 3.3* 3.9 3.6  CL 97* 105 104  CO2 $Re'25 26 27  'tnw$ GLUCOSE 105* 75 77  BUN 12 7 <5*  CREATININE 0.90 0.68 0.60*  CALCIUM 8.1* 7.8* 7.7*  MG 1.8 1.6* 2.1   Liver Function Tests: Recent Labs  Lab 04/30/20 1710 05/01/20 0508  AST 49* 57*  ALT 20 18  ALKPHOS 733* 672*  BILITOT 0.6 0.9  PROT 6.6 5.0*  ALBUMIN 2.6* 2.0*   No results for input(s): LIPASE, AMYLASE in the last 168 hours. No results for input(s): AMMONIA in the last 168 hours. CBC: Recent Labs  Lab 04/30/20 1710 05/01/20 0508  WBC 5.1 6.1  NEUTROABS 3.0 3.7  HGB 11.7* 9.4*  HCT 36.6* 29.4*  MCV 100.0 101.0*  PLT 356 240   Cardiac Enzymes: No results for input(s): CKTOTAL, CKMB, CKMBINDEX, TROPONINI in the last 168 hours. BNP: Invalid input(s): POCBNP CBG: Recent Labs  Lab 05/02/20 0452 05/02/20 0758  GLUCAP 86 85   D-Dimer No results for input(s): DDIMER in the last 72 hours. Hgb A1c No results for input(s): HGBA1C in the last 72 hours. Lipid Profile No results for input(s): CHOL, HDL, LDLCALC, TRIG, CHOLHDL, LDLDIRECT in the last 72 hours. Thyroid function studies Recent Labs    05/01/20 0511  TSH 2.605   Anemia work up No results for  input(s): VITAMINB12, FOLATE, FERRITIN, TIBC, IRON, RETICCTPCT in the last 72 hours. Urinalysis    Component Value Date/Time   COLORURINE AMBER (A) 04/13/2020 1719   APPEARANCEUR CLEAR 04/13/2020 1719   LABSPEC 1.025 04/13/2020 1719   PHURINE 5.0 04/13/2020 1719   GLUCOSEU NEGATIVE 04/13/2020 1719   HGBUR NEGATIVE 04/13/2020 1719   BILIRUBINUR NEGATIVE 04/13/2020 1719   KETONESUR NEGATIVE 04/13/2020 1719   PROTEINUR NEGATIVE 04/13/2020 1719   NITRITE NEGATIVE 04/13/2020 1719   LEUKOCYTESUR NEGATIVE 04/13/2020 1719   Sepsis Labs Invalid input(s): PROCALCITONIN,  WBC,  LACTICIDVEN Microbiology Recent Results (from the past 240 hour(s))  SARS CORONAVIRUS 2 (TAT 6-24 HRS) Nasopharyngeal Nasopharyngeal Swab     Status: None   Collection Time: 04/30/20  8:56 PM   Specimen: Nasopharyngeal Swab  Result Value Ref Range Status   SARS Coronavirus 2 NEGATIVE NEGATIVE Final    Comment: (NOTE) SARS-CoV-2 target nucleic acids are NOT DETECTED.  The SARS-CoV-2 RNA is generally detectable in upper and lower respiratory specimens during the acute phase of infection. Negative results do not preclude SARS-CoV-2 infection, do not rule out co-infections with other pathogens, and should not be used as the sole basis for treatment or other patient management decisions. Negative results must be combined with clinical observations,  patient history, and epidemiological information. The expected result is Negative.  Fact Sheet for Patients: SugarRoll.be  Fact Sheet for Healthcare Providers: https://www.woods-mathews.com/  This test is not yet approved or cleared by the [place] FDA and  has been authorized for detection and/or diagnosis of SARS-CoV-2 by FDA under an Emergency Use Authorization (EUA). This EUA will remain  in effect (meaning this test can be used) for the duration of the COVID-19 declaration under Se ction 564(b)(1) of the Act, 21  U.S.C. section 360bbb-3(b)(1), unless the authorization is terminated or revoked sooner.  Performed at [facility], [place] [address] [place], [place] [address]     Time coordinating discharge:   SIGNED:  Irwin Brakeman, MD  [facility] 05/02/2020, 10:59 AM How to contact the [facility] Attending or Consulting provider [place] or covering provider during after hours [place], for this patient?  1. Check the care team in [facility] and look for a) attending/consulting [facility] provider listed and b) the [facility] team listed 2. Log into [email or web address] and use [place]'s universal password to access. If you do not have the password, please contact the hospital operator. 3. Locate the [facility] provider you are looking for under [facility] and page to a number that you can be directly reached. 4. If you still have difficulty reaching the provider, please page the [facility] (Director on Call) for the Hospitalists listed on amion for assistance.

## 2020-05-02 NOTE — Progress Notes (Signed)
Patient and wife state understanding of discharge instructions 

## 2020-05-03 ENCOUNTER — Other Ambulatory Visit: Payer: Self-pay

## 2020-05-03 ENCOUNTER — Encounter (HOSPITAL_COMMUNITY): Payer: Self-pay | Admitting: Pharmacy Technician

## 2020-05-03 ENCOUNTER — Telehealth (HOSPITAL_COMMUNITY): Payer: Self-pay | Admitting: Surgery

## 2020-05-03 ENCOUNTER — Emergency Department (HOSPITAL_COMMUNITY)
Admission: EM | Admit: 2020-05-03 | Discharge: 2020-05-03 | Disposition: A | Discharge status: Home / Self Care | Payer: 59 | Attending: Emergency Medicine | Admitting: Emergency Medicine

## 2020-05-03 DIAGNOSIS — R197 Diarrhea, unspecified: Secondary | ICD-10-CM | POA: Insufficient documentation

## 2020-05-03 DIAGNOSIS — Z5321 Procedure and treatment not carried out due to patient leaving prior to being seen by health care provider: Secondary | ICD-10-CM | POA: Diagnosis not present

## 2020-05-03 LAB — GLUCOSE, CAPILLARY: Glucose-Capillary: 70 mg/dL (ref 70–99)

## 2020-05-03 LAB — CBC WITH DIFFERENTIAL/PLATELET
Abs Immature Granulocytes: 0.09 10*3/uL — ABNORMAL HIGH (ref 0.00–0.07)
Basophils Absolute: 0 10*3/uL (ref 0.0–0.1)
Basophils Relative: 0 %
Eosinophils Absolute: 0 10*3/uL (ref 0.0–0.5)
Eosinophils Relative: 0 %
HCT: 38.5 % — ABNORMAL LOW (ref 39.0–52.0)
Hemoglobin: 11.7 g/dL — ABNORMAL LOW (ref 13.0–17.0)
Immature Granulocytes: 1 %
Lymphocytes Relative: 7 %
Lymphs Abs: 1.1 10*3/uL (ref 0.7–4.0)
MCH: 32.2 pg (ref 26.0–34.0)
MCHC: 30.4 g/dL (ref 30.0–36.0)
MCV: 106.1 fL — ABNORMAL HIGH (ref 80.0–100.0)
Monocytes Absolute: 0.8 10*3/uL (ref 0.1–1.0)
Monocytes Relative: 5 %
Neutro Abs: 14.8 10*3/uL — ABNORMAL HIGH (ref 1.7–7.7)
Neutrophils Relative %: 87 %
Platelets: 361 10*3/uL (ref 150–400)
RBC: 3.63 MIL/uL — ABNORMAL LOW (ref 4.22–5.81)
RDW: 14.5 % (ref 11.5–15.5)
WBC: 16.8 10*3/uL — ABNORMAL HIGH (ref 4.0–10.5)
nRBC: 0 % (ref 0.0–0.2)

## 2020-05-03 LAB — COMPREHENSIVE METABOLIC PANEL
ALT: 14 U/L (ref 0–44)
AST: 34 U/L (ref 15–41)
Albumin: 2.4 g/dL — ABNORMAL LOW (ref 3.5–5.0)
Alkaline Phosphatase: 660 U/L — ABNORMAL HIGH (ref 38–126)
Anion gap: 12 (ref 5–15)
BUN: 9 mg/dL (ref 6–20)
CO2: 19 mmol/L — ABNORMAL LOW (ref 22–32)
Calcium: 8.2 mg/dL — ABNORMAL LOW (ref 8.9–10.3)
Chloride: 101 mmol/L (ref 98–111)
Creatinine, Ser: 1.03 mg/dL (ref 0.61–1.24)
GFR, Estimated: >60 mL/min (ref >60)
Glucose, Bld: 99 mg/dL (ref 70–99)
Potassium: 4 mmol/L (ref 3.5–5.1)
Sodium: 132 mmol/L — ABNORMAL LOW (ref 135–145)
Total Bilirubin: 0.3 mg/dL (ref 0.3–1.2)
Total Protein: 5.7 g/dL — ABNORMAL LOW (ref 6.5–8.1)

## 2020-05-03 NOTE — Telephone Encounter (Signed)
Pt's wife left a voicemail stating that her husband had just been released from the hospital, and that they were told to make a follow up appointment with Dr. Delton Coombes.  However, the pt's wife stated that the pt was having severe diarrhea throughout the day.  I notified Dr. Delton Coombes, who stated that the pt needed to follow up with his gastroenterologist concerning the diarrhea, and once the diarrhea resolves they can call our office to schedule a follow up.  I left a message with these instructions on the pt's wife's voicemail, and I told her to call our office back if she had any more concerns.

## 2020-05-03 NOTE — ED Triage Notes (Signed)
Pt here with reports of 20+ episodes of diarrhea since yesterday. Pt denies fevers, denies abd pain.

## 2020-05-03 NOTE — ED Provider Notes (Signed)
Emergency Medicine Provider Triage Evaluation Note  [name] 60 y.o. M was evaluated in triage.  Pt complains of diarrhea that began yesterday.  Reports 20+ episodes of loose stools.  No blood that he knows of.  No fevers.  States he has felt weak.  No abdominal pain, nausea/vomiting.  He does not know if he has been on any recent antibiotics.  He states his history of chemo and is on several pills.  No chest pain, difficulty breathing.  Review of Systems  Positive: Diarrhea Negative: abd pain, vomiting, CP, SOB  Physical Exam  BP 134/82   Pulse 70   Temp 98.2 F (36.8 C) (Oral)   Resp 18   Ht 5\' 4"  (1.626 m)   Wt 65.8 kg   SpO2 100%   BMI 24.89 kg/m  Gen:   Awake, no distress   HEENT:  Atraumatic  Resp:  Normal effort  Cardiac:  Normal rate  Abd:   Nondistended, nontender MSK:   Moves extremities without difficulty  Neuro:  Speech clear   Medical Decision Making  Medically screening exam initiated at 3:55 AM.  Appropriate orders placed.  [name] was informed that the remainder of the evaluation will be completed by another provider, this initial triage assessment does not replace that evaluation, and the importance of remaining in the ED until their evaluation is complete.    Clinical Impression  Diarrhea    Portions of this note were generated with Dragon dictation software. Dictation errors may occur despite best attempts at proofreading.     Volanda Napoleon, PA-C 05/03/20 1345    Lacretia Leigh, MD 05/04/20 [phone]

## 2020-05-06 ENCOUNTER — Ambulatory Visit (INDEPENDENT_AMBULATORY_CARE_PROVIDER_SITE_OTHER): Payer: 59 | Admitting: Neurology

## 2020-05-06 ENCOUNTER — Encounter: Payer: Self-pay | Admitting: Neurology

## 2020-05-06 VITALS — BP 150/84 | HR 78 | Ht 72.0 in | Wt 120.0 lb

## 2020-05-06 DIAGNOSIS — R4020 Unspecified coma: Secondary | ICD-10-CM | POA: Diagnosis not present

## 2020-05-06 DIAGNOSIS — R55 Syncope and collapse: Secondary | ICD-10-CM

## 2020-05-06 DIAGNOSIS — I6503 Occlusion and stenosis of bilateral vertebral arteries: Secondary | ICD-10-CM | POA: Diagnosis not present

## 2020-05-06 DIAGNOSIS — Z8673 Personal history of transient ischemic attack (TIA), and cerebral infarction without residual deficits: Secondary | ICD-10-CM

## 2020-05-06 MED ORDER — DIVALPROEX SODIUM ER 500 MG PO TB24: 500.0000 mg | ORAL_TABLET | Freq: Every day | ORAL | 3 refills | Status: DC

## 2020-05-06 NOTE — Patient Instructions (Signed)
I had a long discussion with the patient, wife and daughter regarding his recurrent episodes of brief loss of consciousness followed by memory loss which sound like syncopal episodes given recent history of dehydration and diarrhea and known significant extracranial or intracranial vascular stenosis/occlusion but seizures are also possible given his prior history of strokes.  I recommend treatment trial of Depakote ER 5 mg daily as well as check EEG for seizure activity and MRI scan to look for interval new strokes.  I encouraged him to keep himself well-hydrated and we discussed fall safety precautions and to use a cane and walker at all times.  He was encouraged to keep his upcoming appointment with his gastroenterologist for evaluation for his diarrhea and the oncologist for management of his cancer.  He will return for follow-up in the future in 2 months with Janett Billow my nurse practitioner on call earlier if necessary.  Syncope Syncope is when you pass out (faint) for a short time. It is caused by a sudden decrease in blood flow to the brain. Signs that you may be about to pass out include:  Feeling dizzy or light-headed.  Feeling sick to your stomach (nauseous).  Seeing all white or all black.  Having cold, clammy skin. If you pass out, get help right away. Call your local emergency services (911 in the [place] Do not drive yourself to the hospital. Follow these instructions at home: Watch for any changes in your symptoms. Take these actions to stay safe and help with your symptoms: Lifestyle  Do not drive, use machinery, or play sports until your doctor says it is okay.  Do not drink alcohol.  Do not use any products that contain nicotine or tobacco, such as cigarettes and e-cigarettes. If you need help quitting, ask your doctor.  Drink enough fluid to keep your pee (urine) pale yellow. General instructions  Take over-the-counter and prescription medicines only as told by your  doctor.  If you are taking blood pressure or heart medicine, sit up and stand up slowly. Spend a few minutes getting ready to sit and then stand. This can help you feel less dizzy.  Have someone stay with you until you feel stable.  If you start to feel like you might pass out, lie down right away and raise (elevate) your feet above the level of your heart. Breathe deeply and steadily. Wait until all of the symptoms are gone.  Keep all follow-up visits as told by your doctor. This is important. Get help right away if:  You have a very bad headache.  You pass out once or more than once.  You have pain in your chest, belly, or back.  You have a very fast or uneven heartbeat (palpitations).  It hurts to breathe.  You are bleeding from your mouth or your bottom (rectum).  You have black or tarry poop (stool).  You have jerky movements that you cannot control (seizure).  You are confused.  You have trouble walking.  You are very weak.  You have vision problems. These symptoms may be an emergency. Do not wait to see if the symptoms will go away. Get medical help right away. Call your local emergency services (911 in the [place] Do not drive yourself to the hospital. Summary  Syncope is when you pass out (faint) for a short time. It is caused by a sudden decrease in blood flow to the brain.  Signs that you may be about to faint include feeling dizzy, light-headed, or  sick to your stomach, seeing all white or all black, or having cold, clammy skin.  If you start to feel like you might pass out, lie down right away and raise (elevate) your feet above the level of your heart. Breathe deeply and steadily. Wait until all of the symptoms are gone. This information is not intended to replace advice given to you by your health care provider. Make sure you discuss any questions you have with your health care provider. Document Revised: 01/29/2019 Document Reviewed: 01/31/2017 Elsevier  Patient Education  2021 [employer].

## 2020-05-06 NOTE — Progress Notes (Signed)
[facility] [address] [place]. [place] [address] [phone]       OFFICE CONSULT NOTE  [name] Date of Birth:  [birth year] Medical Record Number:  [id number]   Referring MD:  [name]  Reason for Referral:  Syncope versus seizure  HPI: [name] is a 60 year old Caucasian male is referred today for evaluation for episodes of brief loss of consciousness.  History is obtained from the patient and his wife and daughter eyewitness as well as review of electronic medical records and personally reviewed pertinent imaging films in PACS.  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  He has had multiple recent hospitalizations mostly related to dehydration and diarrhea and several ER visits.  The patient and wife however report that he has had multiple episodes of brief loss of consciousness.  Episodes are stereotypical with the patient's turning his neck upwards with eyes rolling back the patient been briefly unresponsive.  He usually complains of eyes being fuzzy and needing to sit down.  At times he can hold on and make it other times he just falls down and needs to be caught otherwise he will hit himself.  Episodes of brief and regains consciousness quickly.  Patient however does not remember the episode slated for a while though is not confused or disoriented.  There have been no witnessed tonic-clonic activity, tongue bite or incontinence.  This episode started in 2020 settle down after this for a few years but in the last few months episodes seem to have occurred again and now occur at variable frequency from several a day to once every few days.  Patient underwent a CT angiogram of the brain and neck on one of the recent ER admissions on 04/30/2020 which showed 60% stenosis of the right internal carotid  artery at the bulb and 60% stenosis of left ICA at the skull base as well as moderate 50% bilateral carotid siphon stenosis.  Both vertebral arteries were occluded beyond the origin of the PICA.  These findings were not significantly worse compared with previous CT angiogram from 03/04/2019.   He has remote history of bilateral pontine strokes in July 2020 when MRI showed R > L pontine infarcts.  MRA showed occlusion bilateral VA's.  CTA head/neck showed bilateral V4 stenosis with occlusion distal VA and BA distally, right ICA 70% and left ICA 65% narrowing, distal left ICA 60% at cervical petrous junction, and severe ICA atherosclerosis, left CCA 40 to 50% stenosis, right VA 50% stenosis, bilateral V2 60 to 70% stenosis and left subclavian 65% stenosis.  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  No further interventions recommended at that time.  He also has history ofHeavy EtOH use and substance use with cessation counseling provided.  Other stroke risk factors include former tobacco use, family history of stroke and CAD s/p DES 2014.    Patient has not been evaluated for seizures with recent EEG or brain imaging studies.  He denies prior history of seizures.  He has an appointment with a gastroenterologist in a few weeks and with his cancer doctor as well to discuss more treatment for his what appears to be recurrent cancer. ROS:   14 system review of systems is positive for loss of consciousness, syncope, dehydration, diarrhea, loss of memory and  other systems negative  PMH:  Past Medical History:  Diagnosis Date  . Anemia   . Anxiety   . Arthritis   . Cancer of larynx ([place])   . Carotid artery stenosis   . Chronic lower back pain   . Coronary artery disease    a. Diag cath 10/2012 for CP/abnormal nuc -> PCI s/p LAD atherectomy/DES placement 11/05/12.  . Depression   . Dilated aortic root  ([place])   . GERD (gastroesophageal reflux disease)   . History of hiatal hernia   . Hypercholesteremia   . Hypertension   . Pneumonia   . S/P percutaneous endoscopic gastrostomy (PEG) tube placement ([place])   . Stroke [facility]) 07/31/2018   Pons    Social History:  Social History   Socioeconomic History  . Marital status: Married    Spouse name: [name]  . Number of children: 0  . Years of education: Not on file  . Highest education level: Not on file  Occupational History  . Occupation: Disabled    Comment: previously worked in Nurse, learning disability as an Investment banker, corporate  . Smoking status: Former Smoker    Packs/day: 3.00    Years: 32.00    Pack years: 96.00    Types: Cigarettes    Start date: 08/29/1977    Quit date: 11/29/2006    Years since quitting: 13.4  . Smokeless tobacco: Former Systems developer    Types: Chew    Quit date: 2003  . Tobacco comment: 11/05/2012 "chewed tobacco when I play ball; haven't chewed since age 3"  Vaping Use  . Vaping Use: Never used  Substance and Sexual Activity  . Alcohol use: Yes    Alcohol/week: 12.0 standard drinks    Types: 12 Cans of beer per week    Comment: per wife [name] 6-7/week now as of 02/24/19  . Drug use: Not Currently    Types: Marijuana    Comment: Last use was on - denies on 04/08/19  . Sexual activity: Not on file  Other Topics Concern  . Not on file  Social History Narrative   Lives with wife at home   Right Handed   Drinks 5-6 cups caffeine daily   Social Determinants of Health   Financial Resource Strain: Not on file  Food Insecurity: No Food Insecurity  . Worried About Charity fundraiser in the Last Year: Never true  . Ran Out of Food in the Last Year: Never true  Transportation Needs: Not on file  Physical Activity: Not on file  Stress: Not on file  Social Connections: Not on file  Intimate Partner Violence: Not on file    Medications:   Current Outpatient Medications on File Prior to Visit  Medication  Sig Dispense Refill  . acetaminophen (TYLENOL) 325 MG tablet Take 1.5 tablets (487.5 mg total) by mouth every 6 (six) hours as needed for mild pain, fever or headache (or Fever >/= 101).    Marland Kitchen aspirin EC 81 MG tablet Take 1 tablet (81 mg total) by mouth daily with breakfast. 30 tablet 4  . atorvastatin (LIPITOR) 40 MG tablet Take 80 mg by mouth at bedtime.    . dronabinol (MARINOL) 5 MG capsule Take 1 capsule (5 mg total) by mouth 2 (two) times daily before a meal. 60 capsule 2  . feeding supplement (ENSURE ENLIVE / ENSURE PLUS) LIQD Take 237 mLs by mouth 2 (two) times daily between meals. 14220 mL 0  . HYDROcodone-acetaminophen (NORCO) 10-325 MG  tablet Take 1 tablet by mouth every 4 (four) hours as needed. 50 tablet 0  . metoprolol tartrate (LOPRESSOR) 25 MG tablet Take 12.5 mg by mouth 2 (two) times daily.    . mirtazapine (REMERON) 7.5 MG tablet Take 1 tablet (7.5 mg total) by mouth at bedtime. 30 tablet 2  . nitroGLYCERIN (NITROSTAT) 0.4 MG SL tablet Place 1 tablet (0.4 mg total) under the tongue every 5 (five) minutes as needed for chest pain (CP or SOB). 25 tablet 3  . potassium chloride SA (KLOR-CON) 20 MEQ tablet Take 20 mEq by mouth daily.    . prochlorperazine (COMPAZINE) 10 MG tablet TAKE 1 TABLET(10 MG) BY MOUTH EVERY 6 HOURS AS NEEDED FOR NAUSEA (Patient taking differently: Take 10 mg by mouth every 6 (six) hours as needed for nausea.) 60 tablet 2   No current facility-administered medications on file prior to visit.    Allergies:   Allergies  Allergen Reactions  . Duloxetine Other (See Comments)    Caused depression/ patient is taking  . Prednisone Other (See Comments)    Chest pain    Physical Exam General: Frail malnourished looking middle-aged Caucasian male seated, in no evident distress Head: head normocephalic and atraumatic.   Neck: supple with no carotid or supraclavicular bruits Cardiovascular: regular rate and rhythm, no murmurs Musculoskeletal: no  deformity Skin:  no rash/petichiae Vascular:  Normal pulses all extremities  Neurologic Exam Mental Status: Awake and fully alert. Oriented to place and time. Recent and remote memory i poor. Attention span, concentration and fund of knowledge appropriate. Mood and affect appropriate.  Cranial Nerves: Fundoscopic exam reveals sharp disc margins. Pupils equal, briskly reactive to light. Extraocular movements full without nystagmus. Visual fields full to confrontation. Hearing intact. Facial sensation intact. Face, tongue, palate moves normally and symmetrically.  Motor: Normal bulk and tone. Normal strength in all tested extremity muscles except mild bilateral ankle dorsiflexor weakness. Sensory.: intact to touch , pinprick , position and vibratory sensation.  Coordination: Rapid alternating movements normal in all extremities. Finger-to-nose and heel-to-shin performed accurately bilaterally. Gait and Station: Arises from chair with some difficulty. Stance is slightly stooped. Gait uses a walker.  Slightly broad-based with mild bilateral foot drop.   Reflexes: 1+ and symmetric. Toes downgoing.   NIHSS  2 Modified Rankin  3   ASSESSMENT:Jermanie F Kruzel is a 60 y.o. 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 Vascular risk factors include uncontrolled HTN, HLD, CAD, intra-and extracranial stenosis heavy EtOH use and substance abuse. 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  Recent multiple episodes of brief loss of consciousness with memory loss possibly syncopal events in the setting of dehydration from chronic diarrhea and significant occlusive extracranial intracranial disease though complex partial seizures are possible though less likely     PLAN: I had a long discussion with the patient, wife  and daughter regarding his recurrent episodes of brief loss of consciousness followed by memory loss which sound like syncopal episodes given recent history of dehydration and diarrhea and known significant extracranial or intracranial vascular stenosis/occlusion but seizures are also possible given his prior history of strokes.  I recommend treatment trial of Depakote ER 500 mg daily as well as check EEG for seizure activity and MRI scan to look for interval new strokes.  I encouraged him to keep himself well-hydrated  and we discussed fall safety precautions and to use a cane and walker at all times.  He was encouraged to keep his upcoming appointment with his gastroenterologist for evaluation for his diarrhea and the oncologist for management of his cancer.  He will return for follow-up in the future in 2 months with Janett Billow my nurse practitioner on call earlier if necessary.  Greater than 50% time during this 45-minute consultation visit was spent on counseling and coordination of care about episodes of loss of consciousness and discussion about syncope versus seizure and stroke prevention and answering questions. Antony Contras MD Note: This document was prepared with digital dictation and possible smart phrase technology. Any transcriptional errors that result from this process are unintentional.

## 2020-05-10 ENCOUNTER — Telehealth: Payer: Self-pay | Admitting: Neurology

## 2020-05-10 ENCOUNTER — Other Ambulatory Visit: Payer: Self-pay | Admitting: Neurology

## 2020-05-10 MED ORDER — VALPROIC ACID 250 MG/5ML PO SOLN: 500.0000 mg | Freq: Three times a day (TID) | ORAL | 3 refills | Status: DC

## 2020-05-10 NOTE — Telephone Encounter (Signed)
Pt's wife, Bonham Zingale (on [place]) called, the pills for divalproex (DEPAKOTE ER) 500 MG 24 hr tablet are too big for him to swallow. He would need liquid form. Would like a call from the nurse.

## 2020-05-10 NOTE — Telephone Encounter (Signed)
[facility] auth: [id number] (exp. 05/10/20 to 06/08/20) order sent to GI. They will reach out to the patient to schedule.

## 2020-05-10 NOTE — Telephone Encounter (Signed)
Ok to change to depakene liquid 500 mg twice daily. I will change Rx

## 2020-05-12 ENCOUNTER — Ambulatory Visit
Admission: RE | Admit: 2020-05-12 | Discharge: 2020-05-12 | Disposition: A | Discharge status: Home / Self Care | Payer: 59 | Source: Ambulatory Visit | Attending: Neurology | Admitting: Neurology

## 2020-05-12 ENCOUNTER — Telehealth: Payer: Self-pay | Admitting: Neurology

## 2020-05-12 ENCOUNTER — Other Ambulatory Visit: Payer: Self-pay

## 2020-05-12 DIAGNOSIS — R4020 Unspecified coma: Secondary | ICD-10-CM | POA: Diagnosis not present

## 2020-05-12 MED ORDER — GADOBENATE DIMEGLUMINE 529 MG/ML IV SOLN
10.0000 mL | Freq: Once | INTRAVENOUS | Status: AC | PRN
  Administered 2020-05-12: 10 mL via INTRAVENOUS

## 2020-05-12 NOTE — Telephone Encounter (Signed)
Medication was already changed to liquid by dr [name] on 05/10/20. -VRP

## 2020-05-12 NOTE — Telephone Encounter (Signed)
Wife(on DPR) has called to inform pt is unable to swallow the seizure medication, wife is asking that a liquid form of the medication be called in for pt.  Please call

## 2020-05-13 NOTE — Telephone Encounter (Signed)
LVM for return call. 

## 2020-05-13 NOTE — Telephone Encounter (Signed)
Wife called back and informed her the liquid form of medication was sent in to pharmacy on 05/10/20.    She said she would pick it up today.  Patient denied further questions, verbalized understanding and expressed appreciation for the phone call.

## 2020-05-14 NOTE — Progress Notes (Signed)
Kindly inform the patient that MRI scan of the brain shows mild changes of hardening of the arteries and shrinkage of the brain.  Compared with previous MRI from 2020 no new or worrisome findings

## 2020-05-17 ENCOUNTER — Telehealth: Payer: Self-pay | Admitting: Emergency Medicine

## 2020-05-17 ENCOUNTER — Ambulatory Visit (HOSPITAL_COMMUNITY)
Admission: RE | Admit: 2020-05-17 | Discharge: 2020-05-17 | Disposition: A | Discharge status: Home / Self Care | Payer: 59 | Source: Ambulatory Visit | Attending: Hematology | Admitting: Hematology

## 2020-05-17 ENCOUNTER — Inpatient Hospital Stay (HOSPITAL_COMMUNITY): Payer: 59 | Attending: Hematology

## 2020-05-17 ENCOUNTER — Other Ambulatory Visit: Payer: Self-pay

## 2020-05-17 DIAGNOSIS — R918 Other nonspecific abnormal finding of lung field: Secondary | ICD-10-CM | POA: Insufficient documentation

## 2020-05-17 DIAGNOSIS — Z803 Family history of malignant neoplasm of breast: Secondary | ICD-10-CM | POA: Diagnosis not present

## 2020-05-17 DIAGNOSIS — Z7982 Long term (current) use of aspirin: Secondary | ICD-10-CM | POA: Diagnosis not present

## 2020-05-17 DIAGNOSIS — Z8673 Personal history of transient ischemic attack (TIA), and cerebral infarction without residual deficits: Secondary | ICD-10-CM | POA: Insufficient documentation

## 2020-05-17 DIAGNOSIS — R748 Abnormal levels of other serum enzymes: Secondary | ICD-10-CM | POA: Diagnosis not present

## 2020-05-17 DIAGNOSIS — C321 Malignant neoplasm of supraglottis: Secondary | ICD-10-CM

## 2020-05-17 DIAGNOSIS — Z931 Gastrostomy status: Secondary | ICD-10-CM | POA: Diagnosis not present

## 2020-05-17 DIAGNOSIS — Z87891 Personal history of nicotine dependence: Secondary | ICD-10-CM | POA: Diagnosis not present

## 2020-05-17 DIAGNOSIS — Z7289 Other problems related to lifestyle: Secondary | ICD-10-CM | POA: Diagnosis not present

## 2020-05-17 LAB — CBC WITH DIFFERENTIAL/PLATELET
Abs Immature Granulocytes: 0.04 10*3/uL (ref 0.00–0.07)
Basophils Absolute: 0 10*3/uL (ref 0.0–0.1)
Basophils Relative: 0 %
Eosinophils Absolute: 0.3 10*3/uL (ref 0.0–0.5)
Eosinophils Relative: 4 %
HCT: 31.6 % — ABNORMAL LOW (ref 39.0–52.0)
Hemoglobin: 9.8 g/dL — ABNORMAL LOW (ref 13.0–17.0)
Immature Granulocytes: 1 %
Lymphocytes Relative: 14 %
Lymphs Abs: 1 10*3/uL (ref 0.7–4.0)
MCH: 32.3 pg (ref 26.0–34.0)
MCHC: 31 g/dL (ref 30.0–36.0)
MCV: 104.3 fL — ABNORMAL HIGH (ref 80.0–100.0)
Monocytes Absolute: 0.3 10*3/uL (ref 0.1–1.0)
Monocytes Relative: 5 %
Neutro Abs: 5.1 10*3/uL (ref 1.7–7.7)
Neutrophils Relative %: 76 %
Platelets: 254 10*3/uL (ref 150–400)
RBC: 3.03 MIL/uL — ABNORMAL LOW (ref 4.22–5.81)
RDW: 15.1 % (ref 11.5–15.5)
WBC: 6.7 10*3/uL (ref 4.0–10.5)
nRBC: 0 % (ref 0.0–0.2)

## 2020-05-17 LAB — COMPREHENSIVE METABOLIC PANEL
ALT: 7 U/L (ref 0–44)
AST: 16 U/L (ref 15–41)
Albumin: 2 g/dL — ABNORMAL LOW (ref 3.5–5.0)
Alkaline Phosphatase: 258 U/L — ABNORMAL HIGH (ref 38–126)
Anion gap: 6 (ref 5–15)
BUN: 6 mg/dL (ref 6–20)
CO2: 29 mmol/L (ref 22–32)
Calcium: 7.9 mg/dL — ABNORMAL LOW (ref 8.9–10.3)
Chloride: 100 mmol/L (ref 98–111)
Creatinine, Ser: 0.76 mg/dL (ref 0.61–1.24)
GFR, Estimated: >60 mL/min (ref >60)
Glucose, Bld: 100 mg/dL — ABNORMAL HIGH (ref 70–99)
Potassium: 4 mmol/L (ref 3.5–5.1)
Sodium: 135 mmol/L (ref 135–145)
Total Bilirubin: 0.5 mg/dL (ref 0.3–1.2)
Total Protein: 5.5 g/dL — ABNORMAL LOW (ref 6.5–8.1)

## 2020-05-17 NOTE — Telephone Encounter (Signed)
-----   Message from Garvin Fila, MD sent at 05/14/2020  3:57 PM EDT ----- [name] inform the patient that MRI scan of the brain shows mild changes of hardening of the arteries and shrinkage of the brain.  Compared with previous MRI from 2020 no new or worrisome findings

## 2020-05-17 NOTE — Telephone Encounter (Signed)
Called and spoke to [place] (Patient's wife/on DPR).  Discussed Dr. Clydene Fake review and findings of patient's MRI.  Patient denied further questions, verbalized understanding and expressed appreciation for the phone call.

## 2020-05-18 LAB — TSH: TSH: 2.848 u[IU]/mL (ref 0.350–4.500)

## 2020-05-19 ENCOUNTER — Ambulatory Visit (INDEPENDENT_AMBULATORY_CARE_PROVIDER_SITE_OTHER): Payer: 59 | Admitting: Nurse Practitioner

## 2020-05-19 ENCOUNTER — Encounter: Payer: Self-pay | Admitting: Nurse Practitioner

## 2020-05-19 VITALS — BP 100/74 | HR 56 | Ht 69.0 in | Wt 119.4 lb

## 2020-05-19 DIAGNOSIS — R197 Diarrhea, unspecified: Secondary | ICD-10-CM | POA: Diagnosis not present

## 2020-05-19 DIAGNOSIS — K3189 Other diseases of stomach and duodenum: Secondary | ICD-10-CM | POA: Diagnosis not present

## 2020-05-19 NOTE — Progress Notes (Signed)
ASSESSMENT AND PLAN    #60 year old male with diarrhea since February 2022.  Stool studies in April were negative.  Diarrhea completely resolved after passage of a large stool ball earlier this month suggesting this was overflow diarrhea.  Now having formed bowel movements every other day. --follow up as needed --Concept of overflow diarrhea explained to patient.  #History of supraglottis cancer Stage IVA (cT2, cN2c, cM0) status post chemotherapy and radiation. CT chest two days ago shows enlargement of right apical and left lower lobe pulmonary nodules again suspicious for metastatic disease.  Also lung findings highly suspicious for aspiration.  A lingular nodule could represent new metastatic disease or be infectious/inflammatory  #History of severe esophageal stricture ( ? Radiation induced) requiring repeat dilation.  He was last dilated in late March. Tolerating solid food.   # Malnourishment -- He is eating solid diet.  -- Recommended Boost,  Breeze or Ensure BID. Supplements stopped earlier during diarrhea evaluation. --No longer had gastrostomy tube.  --CT chest yesterday raises concern for aspiration.    # Erosive gastropathy March 2022.  Biopsies negative for H. pylori --Someone understandably stopped PPI during diarrhea workup.  I asked him to resume Prilosec 20 mg daily since we have an explanation for the now resolved diarrhea.    HISTORY OF PRESENT ILLNESS    Chief Complaint : follow up on diarrhea ( resolved)  [name] is a 60 y.o. male known to Dr. Rush Landmark  with a past medical history significant for supraglottis cancer, CVA, hypertension.  See PMH below for any additional medical problems.    Patient has a history of supraglottis cancer Stage IVA (cT2, cN2c, cM0) status post chemotherapy and radiation.   He established care here in September 2021 because Radiation Oncology was concerned about upper esophageal involvement of cancer.  At that time he was  tolerating fluids and ice cream but also had a PEG tube for nutrition. Patient was scheduled for an upper endoscopy for further evaluation. He was scheduled concurrently for a screening colonoscopy.  His colonoscopy was normal. He had a benign appearing,  severe esophageal stenosis which was dilated.  Since that time patient has undergone 2 additional upper endoscopies with esophageal dilation ( last one was 03/29/20).  He no longer has a G-tube (it fell out) but he is tolerating a regular diet.   Patient is here for evaluation of diarrhea which has resolved. He started having intermittent diarrhea in February.  We scheduled him for flexible sigmoidoscopy to be done at the time of his follow up EGD 03/29/2020.  However when patient arrived he apparently was not prepped and said that the diarrhea had improved on antimotility agents so the lower endoscopy was not done.    Patient's diarrhea returned at some point and he was hospitalized at [facility] for 2 days in mid April with diarrhea, electrolyte abnormalities, and weight loss.  Electrolytes were repleted. Stool for C. difficile and a GI pathogen panel  were negative.  He was continued on antimotility agents. Of note he had had a CT scan in March for evaluation of diarrhea and there were no acute findings.   Patient readmitted 04/30/2020 with syncope / orthostasis  Neurology recommended EEG and MRI if he had any recurrent symptoms.  He was discharged home 05/02/2020 but returned to the ED the following day for evaluation severe diarrhea.  It appears the visit was truncated due to excessively long wait time.  The following day patient  said he passed a large stool ball which was followed by resolution of diarrhea.  Since then he has been having solid bowel movements about every other day    [place] / PERTINENT STUDIES:   November 2021 screening colonoscopy -- Exam complete, adequate bowel prep.  Hemorrhoids found  Past  Medical History:  Diagnosis Date  . Anemia   . Anxiety   . Arthritis   . Cancer of larynx ([place])   . Carotid artery stenosis   . Chronic lower back pain   . Coronary artery disease    a. Diag cath 10/2012 for CP/abnormal nuc -> PCI s/p LAD atherectomy/DES placement 11/05/12.  . Depression   . Dilated aortic root ([place])   . GERD (gastroesophageal reflux disease)   . History of hiatal hernia   . Hypercholesteremia   . Hypertension   . Pneumonia   . S/P percutaneous endoscopic gastrostomy (PEG) tube placement ([place])   . Stroke [facility]) 07/31/2018   Pons    Current Medications, Allergies, Past Surgical History, Family History and Social History were reviewed in [employer] record.   Current Outpatient Medications  Medication Sig Dispense Refill  . acetaminophen (TYLENOL) 325 MG tablet Take 1.5 tablets (487.5 mg total) by mouth every 6 (six) hours as needed for mild pain, fever or headache (or Fever >/= 101).    Marland Kitchen aspirin EC 81 MG tablet Take 1 tablet (81 mg total) by mouth daily with breakfast. 30 tablet 4  . atorvastatin (LIPITOR) 40 MG tablet Take 80 mg by mouth at bedtime.    . dronabinol (MARINOL) 5 MG capsule Take 1 capsule (5 mg total) by mouth 2 (two) times daily before a meal. 60 capsule 2  . feeding supplement (ENSURE ENLIVE / ENSURE PLUS) LIQD Take 237 mLs by mouth 2 (two) times daily between meals. 14220 mL 0  . HYDROcodone-acetaminophen (NORCO) 10-325 MG tablet Take 1 tablet by mouth every 4 (four) hours as needed. 50 tablet 0  . metoprolol tartrate (LOPRESSOR) 25 MG tablet Take 12.5 mg by mouth 2 (two) times daily.    . mirtazapine (REMERON) 7.5 MG tablet Take 1 tablet (7.5 mg total) by mouth at bedtime. 30 tablet 2  . potassium chloride SA (KLOR-CON) 20 MEQ tablet Take 20 mEq by mouth daily.    . prochlorperazine (COMPAZINE) 10 MG tablet TAKE 1 TABLET(10 MG) BY MOUTH EVERY 6 HOURS AS NEEDED FOR NAUSEA (Patient taking differently: Take 10 mg by mouth every  6 (six) hours as needed for nausea.) 60 tablet 2  . valproic acid (DEPAKENE) 250 MG/5ML solution Take 10 mLs (500 mg total) by mouth 3 (three) times daily. 600 mL 3  . nitroGLYCERIN (NITROSTAT) 0.4 MG SL tablet Place 1 tablet (0.4 mg total) under the tongue every 5 (five) minutes as needed for chest pain (CP or SOB). (Patient not taking: Reported on 05/19/2020) 25 tablet 3   No current facility-administered medications for this visit.    Review of Systems: No chest pain. No shortness of breath. No urinary complaints.   PHYSICAL EXAM :    Wt Readings from Last 3 Encounters:  05/19/20 119 lb 6 oz (54.1 kg)  05/06/20 120 lb (54.4 kg)  04/30/20 122 lb 12.7 oz (55.7 kg)    BP 100/74 (BP Location: Left Arm, Patient Position: Sitting, Cuff Size: Normal)   Pulse (!) 56   Ht 5\' 9"  (1.753 m)   Wt 119 lb 6 oz (54.1 kg)   BMI 17.63  kg/m  Constitutional:  Pleasant thin male in no acute distress. Psychiatric: Normal mood and affect. Behavior is normal. EENT: Pupils normal.  Conjunctivae are normal. No scleral icterus. Cardiovascular: Normal rate, regular rhythm Pulmonary/chest: Effort normal and breath sounds normal. Abdominal: Limited exam in wheelchair .  Abdomen soft, nondistended, nontender. Bowel sounds active throughout.  Neurological: Alert and oriented to person place and time.   Tye Savoy, NP  05/19/2020, 3:11 PM

## 2020-05-19 NOTE — Patient Instructions (Signed)
If you are age 59 or older, your body mass index should be between 23-30. Your Body mass index is 17.63 kg/m. If this is out of the aforementioned range listed, please consider follow up with your Primary Care Provider.  If you are age 33 or younger, your body mass index should be between 19-25. Your Body mass index is 17.63 kg/m. If this is out of the aformentioned range listed, please consider follow up with your Primary Care Provider.   Resume Omeprazole 20 mg daily 30 minutes before breakfast.  Start nutritional supplements twice daily.

## 2020-05-19 NOTE — Progress Notes (Signed)
[name], [name] [id number]   CLINIC:  Medical Oncology/Hematology  PCP:  [name], [place] / [place] [address] [phone]   REASON FOR VISIT:  Follow-up for supraglottic squamous cell carcinoma  PRIOR THERAPY: Chemoradiation with weekly cisplatin from 03/07/2019 to 05/15/2019  NGS Results: not done  CURRENT THERAPY: Surveillance  BRIEF ONCOLOGIC HISTORY:  Oncology History  Malignant neoplasm of supraglottis ([place])  01/22/2019 Initial Diagnosis   Malignant neoplasm of supraglottis ([place])   01/30/2019 Cancer Staging   Staging form: Larynx - Supraglottis, AJCC 8th Edition - Clinical: Stage IVA (cT2, cN2c, cM0) - Signed by [name], MD on 01/30/2019   03/07/2019 -  Chemotherapy   The patient had palonosetron (ALOXI) injection 0.25 mg, 0.25 mg, Intravenous,  Once, 6 of 6 cycles Administration: 0.25 mg (03/13/2019), 0.25 mg (03/07/2019), 0.25 mg (03/27/2019), 0.25 mg (05/01/2019), 0.25 mg (05/08/2019), 0.25 mg (05/15/2019) CISplatin (PLATINOL) 79 mg in sodium chloride 0.9 % 250 mL chemo infusion, 40 mg/m2 = 79 mg, Intravenous,  Once, 6 of 6 cycles Administration: 79 mg (03/13/2019), 79 mg (03/07/2019), 79 mg (03/27/2019), 79 mg (05/01/2019), 79 mg (05/08/2019), 79 mg (05/15/2019) fosaprepitant (EMEND) 150 mg in sodium chloride 0.9 % 145 mL IVPB, 150 mg, Intravenous,  Once, 6 of 6 cycles Administration: 150 mg (03/13/2019), 150 mg (03/07/2019), 150 mg (03/27/2019), 150 mg (05/01/2019), 150 mg (05/08/2019), 150 mg (05/15/2019)  for chemotherapy treatment.      CANCER STAGING: Cancer Staging Malignant neoplasm of supraglottis [facility]) Staging form: Larynx - Supraglottis, AJCC 8th Edition - Clinical: Stage IVA (cT2, cN2c, cM0) - Signed by [name], MD on 01/30/2019   INTERVAL HISTORY:  [name], a 60 y.o. male, returns for routine follow-up of his supraglottic squamous cell carcinoma. [name] was last seen on  03/15/2020.   Today he reports feeling well. His episodes of diarrhea have stopped. He denies any recent cough, fevers, or difficulty swallowing. He refuses further radiation therapy. He reports improved activity levels and appetite.   REVIEW OF SYSTEMS:  Review of Systems  Constitutional: Negative for appetite change and fever (50%).  HENT:   Negative for trouble swallowing.   Respiratory: Negative for cough.   Gastrointestinal: Negative for diarrhea.  Musculoskeletal: Positive for back pain (low back 7/10).  Neurological: Positive for dizziness and headaches.  All other systems reviewed and are negative.   PAST MEDICAL/SURGICAL HISTORY:  Past Medical History:  Diagnosis Date  . Anemia   . Anxiety   . Arthritis   . Cancer of larynx ([place])   . Carotid artery stenosis   . Chronic lower back pain   . Coronary artery disease    a. Diag cath 10/2012 for CP/abnormal nuc -> PCI s/p LAD atherectomy/DES placement 11/05/12.  . Depression   . Dilated aortic root ([place])   . GERD (gastroesophageal reflux disease)   . History of hiatal hernia   . Hypercholesteremia   . Hypertension   . Pneumonia   . S/P percutaneous endoscopic gastrostomy (PEG) tube placement ([place])   . Stroke [facility]) 07/31/2018   Pons   Past Surgical History:  Procedure Laterality Date  . BIOPSY  11/24/2019   Procedure: BIOPSY;  Surgeon: Rush Landmark Telford Nab., MD;  Location: [place];  Service: Gastroenterology;;  . BIOPSY  03/29/2020   Procedure: BIOPSY;  Surgeon: Irving Copas., MD;  Location: [facility];  Service: Gastroenterology;;  . CARDIAC CATHETERIZATION  10/29/2012  . COLONOSCOPY WITH PROPOFOL N/A  11/24/2019   Procedure: COLONOSCOPY WITH PROPOFOL;  Surgeon: Rush Landmark Telford Nab., MD;  Location: [place];  Service: Gastroenterology;  Laterality: N/A;  . CORONARY ANGIOPLASTY WITH STENT PLACEMENT  11/05/2012  . DIRECT LARYNGOSCOPY N/A 01/02/2019   Procedure: MICRO DIRECT LARYNGOSCOPY BIOPSY OF  LARYNGEAL MASS;  Surgeon: [name], MD;  [facility];  Service: ENT;  Laterality: N/A;  . ESOPHAGOGASTRODUODENOSCOPY (EGD) WITH PROPOFOL N/A 08/11/2019   Procedure: ATTEMPTED ESOPHAGOGASTRODUODENOSCOPY (EGD) WITH PROPOFOL (UNABLE TO PERFORM PERCUTANEOUS ENDOSCOPIC GASTROSTOMY TUBE REPLACEMENT);  Surgeon: [name], MD;  Location: [facility];  Service: Gastroenterology;  Laterality: N/A;  . ESOPHAGOGASTRODUODENOSCOPY (EGD) WITH PROPOFOL N/A 11/24/2019   Procedure: ESOPHAGOGASTRODUODENOSCOPY (EGD) WITH PROPOFOL;  Surgeon: Rush Landmark Telford Nab., MD;  Location: [place];  Service: Gastroenterology;  Laterality: N/A;  . ESOPHAGOGASTRODUODENOSCOPY (EGD) WITH PROPOFOL N/A 01/26/2020   Procedure: ESOPHAGOGASTRODUODENOSCOPY (EGD) WITH PROPOFOL;  Surgeon: Rush Landmark Telford Nab., MD;  Location: [place];  Service: Gastroenterology;  Laterality: N/A;  . ESOPHAGOGASTRODUODENOSCOPY (EGD) WITH PROPOFOL N/A 03/29/2020   Procedure: ESOPHAGOGASTRODUODENOSCOPY (EGD) WITH PROPOFOL;  Surgeon: Rush Landmark Telford Nab., MD;  Location: [facility];  Service: Gastroenterology;  Laterality: N/A;  NEEDS FLOURO  . HEMORRHOID SURGERY  ~ 2010  . INSERTION OF MESH N/A 06/02/2015   Procedure: INSERTION OF MESH;  Surgeon: [name], MD;  Location: [facility];  Service: General;  Laterality: N/A;  . IR ANGIO INTRA EXTRACRAN SEL COM CAROTID INNOMINATE BILAT MOD SED  08/02/2018  . IR ANGIO VERTEBRAL SEL VERTEBRAL BILAT MOD SED  08/02/2018  . IR REPLACE G-TUBE SIMPLE WO FLUORO  08/12/2019  . IR REPLC GASTRO/COLONIC TUBE PERCUT W/FLUORO  08/13/2019  . LAPAROSCOPIC INSERTION GASTROSTOMY TUBE N/A 02/24/2019   Procedure: LAPAROSCOPIC INSERTION GASTROSTOMY TUBE;  Surgeon: [name], MD;  Location: [place];  Service: General;  Laterality: N/A;  . LEFT HEART CATHETERIZATION WITH CORONARY ANGIOGRAM N/A 10/30/2012   Procedure: LEFT HEART CATHETERIZATION WITH CORONARY ANGIOGRAM;  Surgeon: [name], MD;  Location: [place] [facility];   Service: Cardiovascular;  Laterality: N/A;  . MULTIPLE EXTRACTIONS WITH ALVEOLOPLASTY N/A 02/18/2019   Procedure: Extraction of tooth #'s 5-7, 10,11,14,20-29, 31 and 32 with alveoloplasty and maxiillary left buccal exostoses reductions;  Surgeon: [name], [name];  Location: [place];  Service: Oral Surgery;  Laterality: N/A;  . PEG PLACEMENT N/A 01/26/2020   Procedure: PERCUTANEOUS ENDOSCOPIC GASTROSTOMY (PEG) REPLACEMENT;  Surgeon: Irving Copas., MD;  Location: [place];  Service: Gastroenterology;  Laterality: N/A;  . PERCUTANEOUS CORONARY ROTOBLATOR INTERVENTION (PCI-R) N/A 11/05/2012   Procedure: PERCUTANEOUS CORONARY ROTOBLATOR INTERVENTION (PCI-R);  Surgeon: [name], MD;  Location: [facility] CATH LAB;  Service: Cardiovascular;  Laterality: N/A;  . SAVORY DILATION N/A 11/24/2019   Procedure: SAVORY DILATION;  Surgeon: Irving Copas., MD;  Location: [place];  Service: Gastroenterology;  Laterality: N/A;  . SAVORY DILATION N/A 01/26/2020   Procedure: SAVORY DILATION;  Surgeon: Irving Copas., MD;  Location: [place];  Service: Gastroenterology;  Laterality: N/A;  . TRACHEOSTOMY TUBE PLACEMENT N/A 02/18/2019   Procedure: Tracheostomy;  Surgeon: [name], MD;  Location: [place];  Service: ENT;  Laterality: N/A;  . UMBILICAL HERNIA REPAIR N/A 06/02/2015   Procedure: UMBILICAL HERNIORRHAPHY WITH MESH;  Surgeon: [name], MD;  Location[facility];  Service: General;  Laterality: N/A;    SOCIAL HISTORY:  Social History   Socioeconomic History  . Marital status: Married    Spouse name: [name]  . Number of children: 0  . Years of education:  Not on file  . Highest education level: Not on file  Occupational History  . Occupation: Disabled    Comment: previously worked in Nurse, learning disability as an Investment banker, corporate  . Smoking status: Former Smoker    Packs/day: 3.00    Years: 32.00    Pack years: 96.00    Types: Cigarettes    Start date:  08/29/1977    Quit date: 11/29/2006    Years since quitting: 13.4  . Smokeless tobacco: Former Systems developer    Types: Chew    Quit date: 2003  . Tobacco comment: 11/05/2012 "chewed tobacco when I play ball; haven't chewed since age 3"  Vaping Use  . Vaping Use: Never used  Substance and Sexual Activity  . Alcohol use: Yes    Alcohol/week: 12.0 standard drinks    Types: 12 Cans of beer per week    Comment: per wife [name] 6-7/week now as of 02/24/19  . Drug use: Not Currently    Types: Marijuana    Comment: Last use was on - denies on 04/08/19  . Sexual activity: Not on file  Other Topics Concern  . Not on file  Social History Narrative   Lives with wife at home   Right Handed   Drinks 5-6 cups caffeine daily   Social Determinants of Health   Financial Resource Strain: Not on file  Food Insecurity: No Food Insecurity  . Worried About Charity fundraiser in the Last Year: Never true  . Ran Out of Food in the Last Year: Never true  Transportation Needs: Not on file  Physical Activity: Not on file  Stress: Not on file  Social Connections: Not on file  Intimate Partner Violence: Not on file    FAMILY HISTORY:  Family History  Problem Relation Age of Onset  . Hypertension Mother   . Heart disease Father   . Hypertension Father   . Heart disease Sister   . Hypertension Maternal Grandmother   . Hypertension Maternal Grandfather   . Hypertension Paternal Grandmother   . Hypertension Paternal Grandfather   . Pancreatic cancer Maternal Uncle   . Breast cancer Maternal Aunt   . Breast cancer Maternal Aunt   . Diabetes Paternal Uncle   . Diabetes Paternal Aunt     CURRENT MEDICATIONS:  Current Outpatient Medications  Medication Sig Dispense Refill  . acetaminophen (TYLENOL) 325 MG tablet Take 1.5 tablets (487.5 mg total) by mouth every 6 (six) hours as needed for mild pain, fever or headache (or Fever >/= 101).    Marland Kitchen aspirin EC 81 MG tablet Take 1 tablet (81 mg total) by mouth daily  with breakfast. 30 tablet 4  . atorvastatin (LIPITOR) 40 MG tablet Take 80 mg by mouth at bedtime.    . dronabinol (MARINOL) 5 MG capsule Take 1 capsule (5 mg total) by mouth 2 (two) times daily before a meal. 60 capsule 2  . feeding supplement (ENSURE ENLIVE / ENSURE PLUS) LIQD Take 237 mLs by mouth 2 (two) times daily between meals. 14220 mL 0  . HYDROcodone-acetaminophen (NORCO) 10-325 MG tablet Take 1 tablet by mouth every 4 (four) hours as needed. 50 tablet 0  . metoprolol tartrate (LOPRESSOR) 25 MG tablet Take 12.5 mg by mouth 2 (two) times daily.    . mirtazapine (REMERON) 7.5 MG tablet Take 1 tablet (7.5 mg total) by mouth at bedtime. 30 tablet 2  . nitroGLYCERIN (NITROSTAT) 0.4 MG SL tablet Place 1 tablet (0.4 mg total)  under the tongue every 5 (five) minutes as needed for chest pain (CP or SOB). (Patient not taking: Reported on 05/19/2020) 25 tablet 3  . potassium chloride SA (KLOR-CON) 20 MEQ tablet Take 20 mEq by mouth daily.    . prochlorperazine (COMPAZINE) 10 MG tablet TAKE 1 TABLET(10 MG) BY MOUTH EVERY 6 HOURS AS NEEDED FOR NAUSEA (Patient taking differently: Take 10 mg by mouth every 6 (six) hours as needed for nausea.) 60 tablet 2  . valproic acid (DEPAKENE) 250 MG/5ML solution Take 10 mLs (500 mg total) by mouth 3 (three) times daily. 600 mL 3   No current facility-administered medications for this visit.    ALLERGIES:  Allergies  Allergen Reactions  . Cymbalta [Duloxetine Hcl]   . Duloxetine Other (See Comments)    Caused depression/ patient is taking  . Prednisone Other (See Comments)    Chest pain    PHYSICAL EXAM:  Performance status (ECOG): 1 - Symptomatic but completely ambulatory  There were no vitals filed for this visit. Wt Readings from Last 3 Encounters:  05/19/20 119 lb 6 oz (54.1 kg)  05/06/20 120 lb (54.4 kg)  04/30/20 122 lb 12.7 oz (55.7 kg)   Physical Exam Vitals reviewed.  Constitutional:      Appearance: Normal appearance.  Cardiovascular:      Rate and Rhythm: Normal rate and regular rhythm.     Pulses: Normal pulses.     Heart sounds: Normal heart sounds.  Pulmonary:     Effort: Pulmonary effort is normal.     Breath sounds: Normal breath sounds.  Musculoskeletal:     Right lower leg: No edema.     Left lower leg: No edema.  Neurological:     General: No focal deficit present.     Mental Status: He is alert and oriented to person, place, and time.  Psychiatric:        Mood and Affect: Mood normal.        Behavior: Behavior normal.      LABORATORY DATA:  I have reviewed the labs as listed.  CBC Latest Ref Rng & Units 05/17/2020 05/03/2020 05/01/2020  WBC 4.0 - 10.5 K/uL 6.7 16.8(H) 6.1  Hemoglobin 13.0 - 17.0 g/dL 9.8(L) 11.7(L) 9.4(L)  Hematocrit 39.0 - 52.0 % 31.6(L) 38.5(L) 29.4(L)  Platelets 150 - 400 K/uL 254 361 240   CMP Latest Ref Rng & Units 05/17/2020 05/03/2020 05/02/2020  Glucose 70 - 99 mg/dL 100(H) 99 77  BUN 6 - 20 mg/dL 6 9 <5(L)  Creatinine 0.61 - 1.24 mg/dL 0.76 1.03 0.60(L)  Sodium 135 - 145 mmol/L 135 132(L) 136  Potassium 3.5 - 5.1 mmol/L 4.0 4.0 3.6  Chloride 98 - 111 mmol/L 100 101 104  CO2 22 - 32 mmol/L 29 19(L) 27  Calcium 8.9 - 10.3 mg/dL 7.9(L) 8.2(L) 7.7(L)  Total Protein 6.5 - 8.1 g/dL 5.5(L) 5.7(L) -  Total Bilirubin 0.3 - 1.2 mg/dL 0.5 0.3 -  Alkaline Phos 38 - 126 U/L 258(H) 660(H) -  AST 15 - 41 U/L 16 34 -  ALT 0 - 44 U/L 7 14 -    DIAGNOSTIC IMAGING:  I have independently reviewed the scans and discussed with the patient. CT Angio Head W or Wo Contrast  Result Date: 04/30/2020 CLINICAL DATA:  Nonspecific dizziness and headache. EXAM: CT ANGIOGRAPHY HEAD AND NECK TECHNIQUE: Multidetector CT imaging of the head and neck was performed using the standard protocol during bolus administration of intravenous contrast. Multiplanar CT image reconstructions  and MIPs were obtained to evaluate the vascular anatomy. Carotid stenosis measurements (when applicable) are obtained utilizing  NASCET criteria, using the distal internal carotid diameter as the denominator. CONTRAST:  3mL OMNIPAQUE IOHEXOL 350 MG/ML SOLN COMPARISON:  08/01/2018 FINDINGS: CT HEAD FINDINGS Brain: Age related atrophy. Chronic small-vessel ischemic changes of the cerebral hemispheric white matter and pons. No sign of acute infarction, mass lesion, hemorrhage, hydrocephalus or extra-axial collection. Vascular: There is atherosclerotic calcification of the major vessels at the base of the brain. Skull: Negative Sinuses: Clear Orbits: Normal Review of the MIP images confirms the above findings CTA NECK FINDINGS Aortic arch: Aortic atherosclerosis. Branching pattern is normal without origin stenosis. Right carotid system: Common carotid artery shows scattered plaque but is widely patent to the bifurcation. Extensive calcified plaque at the carotid bifurcation and ICA bulb. Minimal diameter in the ICA bulb is 2 mm. Compared to a more distal cervical ICA diameter of 5 mm, this indicates a 60% stenosis. Left carotid system: Common carotid artery shows scattered plaque but is patent to the bifurcation. Soft and calcified plaque at the carotid bifurcation and ICA bulb. Minimal diameter of the ICA bulb is 3 mm. Compared to a more distal cervical ICA diameter of 5 mm, this indicates a 40% stenosis. Calcification of the ICA just proximal to the skull base narrows the lumen to 2 mm, consistent with 60% stenosis in this location. Vertebral arteries: Atherosclerotic plaque at both vertebral artery origins. No stenosis at the left origin. 30-50% stenosis of the proximal right vertebral artery. Beyond that, both vertebral arteries are patent through the cervical region, with areas of scattered calcified plaque. 50% stenosis of the right vertebral artery at the C3 level. Skeleton: Degenerative cervical spondylosis. Other neck: No mass or lymphadenopathy. Upper chest: Emphysema and pulmonary scarring. 12 mm mass lesion in the right upper lobe,  further enlarged when compared to a CT of 10/20/2019. This is quite likely to represent lung carcinoma. Review of the MIP images confirms the above findings CTA HEAD FINDINGS Anterior circulation: The internal carotid arteries are patent through the skull base and siphon regions. Extensive atherosclerotic calcification in both carotid siphon regions with stenosis estimated at 50% on both sides. Supraclinoid internal carotid arteries are widely patent. The anterior and middle cerebral vessels are patent. No large or medium vessel occlusion or correctable proximal stenosis. Posterior circulation: Both vertebral arteries are patent at the foramen magnum. On the right, there is occlusion just beyond PICA. On the left, there is occlusion just beyond PICA. The distal basilar artery is reconstituted by patent posterior communicating arteries. Flow is seen within both posterior cerebral arteries and both superior cerebellar arteries. Similar appearance to the study of July 2020. Venous sinuses: Patent Anatomic variants: None other significant. Review of the MIP images confirms the above findings IMPRESSION: 1. Extensive calcified plaque at both carotid bifurcations and ICA bulb regions. 60% stenosis of the ICA bulb on the right. 40% stenosis of the ICA bulb on the left. 60% stenosis of the left cervical ICA just proximal to the skull base. 2. 30-50% stenosis of the proximal right vertebral artery. 50% stenosis of the right vertebral artery at the C3 level. 3. Extensive atherosclerotic calcification in both carotid siphon regions with stenosis estimated at 50% on both sides. 4. No acute intracranial anterior circulation large or medium vessel occlusion or correctable proximal stenosis. 5. 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 Reconstitution of the distal basilar artery from patent posterior communicating arteries. 6. 12  mm mass lesion in the right upper lobe, further enlarged when compared to a CT of  10/20/2019. This is quite likely to represent lung carcinoma. 7. Emphysema and aortic atherosclerosis. Aortic Atherosclerosis (ICD10-I70.0) and Emphysema (ICD10-J43.9). Electronically Signed   By: Nelson Chimes M.D.   On: 04/30/2020 18:58   CT SOFT TISSUE NECK WO CONTRAST  Result Date: 05/18/2020 CLINICAL DATA:  Follow-up supraglottic squamous cell carcinoma. EXAM: CT NECK WITHOUT CONTRAST TECHNIQUE: Multidetector CT imaging of the neck was performed following the standard protocol without intravenous contrast. COMPARISON:  Contrast-enhanced neck CT 09/05/2019 FINDINGS: Pharynx and larynx: There is mild residual laryngeal edema, decreased from the prior study. No recurrent mass is identified within limitations of noncontrast technique. The airway is widely patent. Salivary glands: Post treatment changes primarily involving the submandibular glands. Thyroid: Unremarkable. Lymph nodes: No enlarged or suspicious lymph nodes in the neck. Vascular: Extensive cervical carotid atherosclerosis. Limited intracranial: Extensive calcified atherosclerosis of the internal carotid and vertebral arteries at the skull base. Visualized orbits: Unremarkable. Mastoids and visualized paranasal sinuses: Clear. Skeleton: No suspicious osseous lesion. Upper thoracic levoscoliosis. Moderate disc degeneration at C5-6 and C6-7. Upper chest: Reported separately. Other: None. IMPRESSION: Post treatment changes with decreased laryngeal edema. No evidence of recurrent disease in the neck. Electronically Signed   By: Logan Bores M.D.   On: 05/18/2020 15:42   CT Angio Neck W and/or Wo Contrast  Result Date: 04/30/2020 CLINICAL DATA:  Nonspecific dizziness and headache. EXAM: CT ANGIOGRAPHY HEAD AND NECK TECHNIQUE: Multidetector CT imaging of the head and neck was performed using the standard protocol during bolus administration of intravenous contrast. Multiplanar CT image reconstructions and MIPs were obtained to evaluate the vascular  anatomy. Carotid stenosis measurements (when applicable) are obtained utilizing NASCET criteria, using the distal internal carotid diameter as the denominator. CONTRAST:  68mL OMNIPAQUE IOHEXOL 350 MG/ML SOLN COMPARISON:  08/01/2018 FINDINGS: CT HEAD FINDINGS Brain: Age related atrophy. Chronic small-vessel ischemic changes of the cerebral hemispheric white matter and pons. No sign of acute infarction, mass lesion, hemorrhage, hydrocephalus or extra-axial collection. Vascular: There is atherosclerotic calcification of the major vessels at the base of the brain. Skull: Negative Sinuses: Clear Orbits: Normal Review of the MIP images confirms the above findings CTA NECK FINDINGS Aortic arch: Aortic atherosclerosis. Branching pattern is normal without origin stenosis. Right carotid system: Common carotid artery shows scattered plaque but is widely patent to the bifurcation. Extensive calcified plaque at the carotid bifurcation and ICA bulb. Minimal diameter in the ICA bulb is 2 mm. Compared to a more distal cervical ICA diameter of 5 mm, this indicates a 60% stenosis. Left carotid system: Common carotid artery shows scattered plaque but is patent to the bifurcation. Soft and calcified plaque at the carotid bifurcation and ICA bulb. Minimal diameter of the ICA bulb is 3 mm. Compared to a more distal cervical ICA diameter of 5 mm, this indicates a 40% stenosis. Calcification of the ICA just proximal to the skull base narrows the lumen to 2 mm, consistent with 60% stenosis in this location. Vertebral arteries: Atherosclerotic plaque at both vertebral artery origins. No stenosis at the left origin. 30-50% stenosis of the proximal right vertebral artery. Beyond that, both vertebral arteries are patent through the cervical region, with areas of scattered calcified plaque. 50% stenosis of the right vertebral artery at the C3 level. Skeleton: Degenerative cervical spondylosis. Other neck: No mass or lymphadenopathy. Upper  chest: Emphysema and pulmonary scarring. 12 mm mass lesion in the right upper  lobe, further enlarged when compared to a CT of 10/20/2019. This is quite likely to represent lung carcinoma. Review of the MIP images confirms the above findings CTA HEAD FINDINGS Anterior circulation: The internal carotid arteries are patent through the skull base and siphon regions. Extensive atherosclerotic calcification in both carotid siphon regions with stenosis estimated at 50% on both sides. Supraclinoid internal carotid arteries are widely patent. The anterior and middle cerebral vessels are patent. No large or medium vessel occlusion or correctable proximal stenosis. Posterior circulation: Both vertebral arteries are patent at the foramen magnum. On the right, there is occlusion just beyond PICA. On the left, there is occlusion just beyond PICA. The distal basilar artery is reconstituted by patent posterior communicating arteries. Flow is seen within both posterior cerebral arteries and both superior cerebellar arteries. Similar appearance to the study of July 2020. Venous sinuses: Patent Anatomic variants: None other significant. Review of the MIP images confirms the above findings IMPRESSION: 1. Extensive calcified plaque at both carotid bifurcations and ICA bulb regions. 60% stenosis of the ICA bulb on the right. 40% stenosis of the ICA bulb on the left. 60% stenosis of the left cervical ICA just proximal to the skull base. 2. 30-50% stenosis of the proximal right vertebral artery. 50% stenosis of the right vertebral artery at the C3 level. 3. Extensive atherosclerotic calcification in both carotid siphon regions with stenosis estimated at 50% on both sides. 4. No acute intracranial anterior circulation large or medium vessel occlusion or correctable proximal stenosis. 5. 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 Reconstitution of the distal basilar artery from patent posterior communicating arteries. 6.  12 mm mass lesion in the right upper lobe, further enlarged when compared to a CT of 10/20/2019. This is quite likely to represent lung carcinoma. 7. Emphysema and aortic atherosclerosis. Aortic Atherosclerosis (ICD10-I70.0) and Emphysema (ICD10-J43.9). Electronically Signed   By: Nelson Chimes M.D.   On: 04/30/2020 18:58   CT CHEST WO CONTRAST  Result Date: 05/18/2020 CLINICAL DATA:  Follow-up of head neck cancer. Chemotherapy and radiation therapy. Left-sided chest and rib pain after a fall 2 days ago. Follow-up of pulmonary nodule. EXAM: CT CHEST WITHOUT CONTRAST TECHNIQUE: Multidetector CT imaging of the chest was performed following the standard protocol without IV contrast. COMPARISON:  02/12/2020 FINDINGS: Cardiovascular: Advanced aortic and branch vessel atherosclerosis. Tortuous thoracic aorta. Mild cardiomegaly with trace pericardial fluid. Three vessel coronary artery calcification. Pulmonary artery enlargement, outflow tract 3.1 cm Mediastinum/Nodes: No mediastinal or definite hilar adenopathy, given limitations of unenhanced CT. Lungs/Pleura: No pleural fluid.  Moderate centrilobular emphysema. Fluid within the left lower lobe endobronchial tree and right lower lobe bronchus, new. Medial right apical pulmonary nodule measures 1.1 x 0.9 cm on 43/4 versus 9 x 8 mm on the prior exam (when remeasured). Left lower lobe 6 mm pulmonary nodule on 114/4 measures 4 mm on the prior exam. Development of multiple areas of basilar predominant ground-glass and less so airspace opacity. Some of these are nodular including in the peribronchovascular left lower lobe on 110/4. A medial lingular subpleural 5 mm nodule on 120/4 is new. Upper Abdomen: Normal imaged portions of the liver, spleen, stomach, adrenal glands, kidneys. Musculoskeletal: Convex left thoracic spine curvature. IMPRESSION: 1. Enlargement of right apical and left lower lobe pulmonary nodules, again suspicious for metastatic disease. 2. Combination of  findings, including fluid in the lower lobe endobronchial tree with basilar predominant areas of ground-glass, less so airspace disease, highly suspicious for aspiration. A lingular  nodule could represent new metastatic disease are also be infectious/inflammatory. 3. No thoracic adenopathy. 4. Aortic atherosclerosis (ICD10-I70.0), coronary artery atherosclerosis and emphysema (ICD10-J43.9). 5. Pulmonary artery enlargement suggests pulmonary arterial hypertension. 6. The left renal abnormality on the prior exam is not imaged today. Electronically Signed   By: Abigail Miyamoto M.D.   On: 05/18/2020 15:44   MR BRAIN W WO CONTRAST  Result Date: 05/14/2020  [facility] [address], [place], [place] [address] [phone] NEUROIMAGING REPORT STUDY DATE: 05/12/2020 PATIENT NAME: ISHMEAL RORIE DOB: 1960-06-21 MRN: [id number] ORDERING CLINICIAN: Dr. [name] CLINICAL HISTORY: 60 year old patient being evaluated for episode of loss of consciousness COMPARISON FILMS: MRI brain 07/31/2018 EXAM: MRI brain with and without contrast TECHNIQUE: MRI of the brain with and without contrast was obtained utilizing 5 mm axial slices with T1, T2, T2 flair, T2 star gradient echo and diffusion weighted views.  T1 sagittal, T2 coronal and postcontrast views in the axial and coronal plane were obtained. CONTRAST: IV Multi[facility] IMAGING SITE: [facility] FINDINGS: The brain parenchyma shows mild changes of chronic small vessel disease.  There is mild age advanced generalized cerebral cortical atrophy noted.  No other structural lesion, tumor or infarct is noted.  Subarachnoid space and ventricular system show slight dilatation proportionate to degree of cortical atrophy.  Calvarium shows no abnormalities.  Extra-axial brain structures appear normal.  Orbits appear unremarkable.  Paranasal sinuses show mild chronic inflammatory changes.  Pituitary gland and cerebellar tonsils appear normal.  Flow-voids of large  vessels are intact and circulation appear to be patent.  Postcontrast images do not result in abnormal areas of enhancement.  Visualized portion of the upper cervical spine show no abnormalities.  The paranasal sinuses show mild chronic inflammatory changes.  The previously seen acute pontine infarct is more visualized on the current scan   Abnormal MRI scan of the brain with and without contrast showing mild age advanced generalized cerebral cortical atrophy and mild changes of chronic small vessel disease.  No significant change compared with previous MRI from 07/31/2018 except evolutionary changes noted in the previously seen acute pontine infarct. INTERPRETING PHYSICIAN: Antony Contras, MD Certified in  Neuroimaging by [employer] and [employer]  DG Chest Portable 1 View  Result Date: 04/30/2020 CLINICAL DATA:  Syncope, history of laryngeal cancer, history of tobacco abuse EXAM: PORTABLE CHEST 1 VIEW COMPARISON:  02/22/2020 FINDINGS: 2 frontal views of the chest demonstrate an unremarkable cardiac silhouette. No acute airspace disease, effusion, or pneumothorax. No acute bony abnormalities. IMPRESSION: 1. No acute intrathoracic process. Electronically Signed   By: Randa Ngo M.D.   On: 04/30/2020 18:03     ASSESSMENT:  1. T3N2C supraglottic squamous cell carcinoma: -Laryngoscopy and biopsy on 01/02/2019, trach placed on 02/18/2019. -PET scan on 01/27/2019 showed hypermetabolic laryngeal lesion with hypermetabolic cervical metastatic disease bilaterally. -Weekly cisplatin and radiation started on 03/07/2019, many interruptions due to hospitalizations. -XRT completed on 05/08/2019 and last weekly cisplatin on 05/15/2019. -CT angio chest on 10/20/2019 showed 6 mm right upper lobe lung nodule increased in size from prior PET scan. Resolved subpleural nodules laterally in the right upper lobe. Subcentimeter hyperenhancing liver lesions. -PET scan  on 11/13/2059 showed moderate hypermetabolism noted in the prior tracheostomy tract, likely granulation tissue. No lymphadenopathy in the neck. 6 mm right upper lobe nodule is mildly hypermetabolic with SUV 2.09. 5 mm left axillary lymph node with SUV 3.49. 6.5 mm precarinal lymph node slightly enlarged from 5  mm. Difficult to measure bilateral hilar lymph nodes are hypermetabolic.  2. Nutrition: -He has a PEG tube. He is using Osmolite 1.53 to 4 cans/day.  3. CVA: -She had pontine stroke with large vessel occlusion. -He had bleeding from trach site while on Plavix which was held since 03/04/2019.   PLAN:  1. Supraglottic squamous cell carcinoma: -Continue follow-up with Dr. Benjamine Mola and Dr. Isidore Moos. - Oropharyngeal or neck lymphadenopathy was not seen. - Reviewed CT soft tissue neck from 05/17/2020 which showed posttreatment changes with decreased laryngeal edema with no evidence of recurrence disease in the neck. - We will consider repeating another CT scan in 6 months.  2. Nutrition: -He reports very good appetite at this time. - His weight has been stable over the last several weeks. - Diarrhea completely resolved.  Previous stool studies were negative.  3. CVA: -Plavix is on hold.  He is on aspirin 81 mg daily.  4. Elevated LFTs: -He had previously elevated AST and ALT which normalized. - Today's alkaline phosphatase is elevated at 258 but downtrending. - Previous CT CAP from March 2022 did not reveal any liver lesions.  5.  Lung nodules: -CT chest without contrast on 05/17/2020 shows right apical lung nodule measuring 1.1 x 0.9 cm, increased from 9 x 8 mm on prior exam.  Left lower lobe 6 mm lung nodule measured 4 mm on the prior exam. - These lung nodules are gradually increasing in size.  We discussed about SBRT to these lesions. - He adamantly refuses radiation. - Recommend follow-up with chest CT without contrast in 3 months.  Orders placed this encounter:  No  orders of the defined types were placed in this encounter.    [name], MD [place] [phone]   I, Thana Ates, am acting as a scribe for Dr. [name].  I, [name] MD, have reviewed the above documentation for accuracy and completeness, and I agree with the above.

## 2020-05-19 NOTE — Progress Notes (Signed)
Attending Physician's Attestation   I have reviewed the chart.   I agree with the Advanced Practitioner's note, impression, and recommendations with any updates as below.    [name], MD [facility]oscopy Office # [phone]  

## 2020-05-20 ENCOUNTER — Other Ambulatory Visit: Payer: Self-pay

## 2020-05-20 ENCOUNTER — Inpatient Hospital Stay (HOSPITAL_BASED_OUTPATIENT_CLINIC_OR_DEPARTMENT_OTHER): Payer: 59 | Admitting: Hematology

## 2020-05-20 ENCOUNTER — Ambulatory Visit (HOSPITAL_COMMUNITY): Payer: 59 | Admitting: Hematology

## 2020-05-20 VITALS — BP 106/72 | HR 57 | Temp 96.8°F | Resp 18 | Wt 119.6 lb

## 2020-05-20 DIAGNOSIS — C760 Malignant neoplasm of head, face and neck: Secondary | ICD-10-CM | POA: Diagnosis not present

## 2020-05-20 DIAGNOSIS — C321 Malignant neoplasm of supraglottis: Secondary | ICD-10-CM | POA: Diagnosis not present

## 2020-05-20 DIAGNOSIS — R197 Diarrhea, unspecified: Secondary | ICD-10-CM

## 2020-05-20 NOTE — Patient Instructions (Addendum)
[place] at [facility] Discharge Instructions  You were seen today by Dr. Delton Coombes. He went over your recent results. You will be scheduled for a CT of your chest prior to your next visit. Dr. Delton Coombes will see you back in 3 months for labs and follow up.   Thank you for choosing [place] at [facility] to provide your oncology and hematology care.  To afford each patient quality time with our provider, please arrive at least 15 minutes before your scheduled appointment time.   If you have a lab appointment with the [facility] please come in thru the Main Entrance and check in at the main information desk  You need to re-schedule your appointment should you arrive 10 or more minutes late.  We strive to give you quality time with our providers, and arriving late affects you and other patients whose appointments are after yours.  Also, if you no show three or more times for appointments you may be dismissed from the clinic at the providers discretion.     Again, thank you for choosing [facility].  Our hope is that these requests will decrease the amount of time that you wait before being seen by our physicians.       _____________________________________________________________  Should you have questions after your visit to [facility], please contact our office at [phone] between the hours of 8:00 a.m. and 4:30 p.m.  Voicemails left after 4:00 p.m. will not be returned until the following business day.  For prescription refill requests, have your pharmacy contact our office and allow 72 hours.    Cancer Center Support Programs:   > Cancer Support Group  2nd Tuesday of the month 1pm-2pm, Journey Room

## 2020-05-24 ENCOUNTER — Other Ambulatory Visit: Payer: 59

## 2020-05-26 ENCOUNTER — Other Ambulatory Visit: Payer: Medicaid Other | Admitting: Nurse Practitioner

## 2020-05-26 ENCOUNTER — Other Ambulatory Visit: Payer: Self-pay

## 2020-05-26 VITALS — BP 98/70 | HR 71 | Resp 16 | Ht 72.0 in | Wt 118.0 lb

## 2020-05-26 DIAGNOSIS — G894 Chronic pain syndrome: Secondary | ICD-10-CM

## 2020-05-26 DIAGNOSIS — K5900 Constipation, unspecified: Secondary | ICD-10-CM

## 2020-05-26 DIAGNOSIS — E43 Unspecified severe protein-calorie malnutrition: Secondary | ICD-10-CM

## 2020-05-26 DIAGNOSIS — Z515 Encounter for palliative care: Secondary | ICD-10-CM

## 2020-05-26 NOTE — Progress Notes (Addendum)
[facility] Palliative Care Consult Note Telephone: [phone]  Fax: [phone]    Date of encounter: 05/26/20 PATIENT NAME: [name] [address] [place] [address]   [id number] (home)  DOB: [birth year] MRN: [id number]  PRIMARY CARE PROVIDER:    [name], MD,  [address] [place] [address] [phone]  REFERRING PROVIDER:   [name], [place],  [place] [address] [phone]  RESPONSIBLE PARTY:    Contact Information    Name Relation Home Work [place], [place] Spouse   Evaro Daughter   [phone]   Hameed, Kolar Father [phone]  [phone]     I met face to face with patient in home. His wife and daughter [name] present during the visit. Palliative Care was asked to follow this patient by consultation request of  [name], MD to address advance care planning and complex medical decision making. This is the initial visit.                                   ASSESSMENT AND PLAN / RECOMMENDATIONS:   Advance Care Planning/Goals of Care: Goals include to maximize quality of life and symptom management. Our advance care planning conversation included a discussion about:     The value and importance of advance care planning   Experiences with loved ones who have been seriously ill or have died   Exploration of personal, cultural or spiritual beliefs that might influence medical decisions   Exploration of goals of care in the event of a sudden injury or illness   CODE STATUS: Full Code Directive: Patient declined participating in further discussion related to Advance care planning saying he is not interested in discussing death or dying. Patient expressed concern that the discussion would be to advise him to elect Hospice services. Attempt at educating patient on the difference between Palliative care and Hospice was unsuccessful. Provided general support  and encouragement, we would revisit discussion at another time.  Palliative care will continue to provide support to patient, family and the medical team.  I spent 30 minutes providing this consultation.  More than 50% of the time in this consultation was spent counseling and coordinating communication.  ---------------------------------------------------------------------------------  Symptom Management/Plan: Constipation: Continue current plan of care with Miralax 17g by mouth daily and Lactulose 73ml by mouth 4 times a daily.  Encouraged adequate oral fluid intake. Patient advised to go to emergency room for evaluation if he develops abdominal pain, nausea or vomiting.  Chronic pain syndrome: continue current regimen with Hydrocodone- Acetaminophen 10-325 mg every 4hrs as needed. Patient advised to hold it if sedated. Severe protein calorie malnutrition: BMI 16. Patient on Marinol and Mirtazapine 7.$RemoveBeforeDEI'5mg'[id number]$  daily for appetite stimulation. Continue calorie supplementation with nutritional supplement Boost, patient report not liking Ensure. Encouraged to consume at least 2 cans a day. Patient verbalized understanding. Questions and concerns were addressed. Patient and family was encouraged to call with questions and/or concerns. My business card was provided.  Follow up Palliative Care Visit: Palliative care will continue to follow for complex medical decision making, advance care planning, and clarification of goals. Return in one week or prn.  PPS: 40%  HOSPICE ELIGIBILITY/DIAGNOSIS: TBD  Chief Complaint: constipation  History obtained from review of Epic EMR and discussion with [name] and his family.  HISTORY OF PRESENT ILLNESS:  [name] is  a 60 y.o. year old male  With multiple medical problem significant for Malignant neoplasm of supraglottic with mets to lung,  chronic low back pain Patient with complaints of constipation. He report last bowel movement to be a week ago. No report  of associated symptoms like nausea, vomiting, abdominal pain or distension. Wife report administering enema twice in the last 24hrs without relief. Patient was seen by his PCP yesterday with prescription for Lactulose and Miralax. Patient is yet to pick up Lactulose from his pharmacy. He report his daughter will pick up the prescription today.  Reviewed imaging reports CT scan of the chest from 05/17/2020 showed enlargement of a right apical and left lower lobe pulmonary nodule suspicious for metastatic disease. MRI from 05/12/2020 showed no signs of metastatic disease to the brain, just generalized cerebral cortical atrophy and chronic small vessel disease. Reviewed labs from 05/17/2020 Na 135, K 4, Cr 0.76, GFR >60, WBC 6.7, Hgb 9.8, Hct 31.6  I reviewed available labs, medications, imaging, studies and related documents from the EMR.  Records reviewed and summarized above.   ROS EYES: denies acute vision changes ENMT: denies dysphagia Cardiovascular: denies chest pain, denies DOE Pulmonary: denies cough, denies increased SOB Abdomen: endorses poor, endorsed constipation, endorses continence of bowel GU: denies dysuria, endorses continence of urine MSK: endorsed weakness, report falls  Skin: denies rashes or wounds Neurological: endorsed pain, denies insomnia Psych: Endorses positive mood Heme/lymph/immuno: denies bruises, abnormal bleeding  Physical Exam: General: chronically ill and frail appearing, cachetic, NAD  EYES: anicteric sclera, no discharge  ENMT: intact hearing, oral mucous membranes moist CV: RRR, no LE edema Pulmonary: LCTA, no increased work of breathing, no cough, room air Abdomen: no ascites GU: deferred MSK: severe sarcopenia, moves all extremities, ambulatory Skin: warm and dry, no rashes or wounds on visible skin Neuro: generalized weakness, no cognitive impairment Psych: non-anxious affect Hem/lymph/immuno: no widespread bruising  CURRENT PROBLEM LIST:   Patient Active Problem List   Diagnosis Date Noted  . Generalized weakness 05/01/2020  . Orthostatic hypotension 05/01/2020  . Orthostasis   . Syncope 04/30/2020  . Hypokalemia 04/13/2020  . Diarrhea 04/13/2020  . Hypomagnesemia 04/13/2020  . PEG (percutaneous endoscopic gastrostomy) adjustment/replacement/removal ([place]) 08/11/2019  . PEG tube malfunction ([place]) 08/10/2019  . HCAP (healthcare-associated pneumonia) 04/10/2019  . Febrile illness, acute 04/08/2019  . Moderate protein-calorie malnutrition ([place]) 04/03/2019  . GI bleed 04/01/2019  . Dehydration 03/28/2019  . RLL pneumonia 03/17/2019  . Hx of tracheostomy 02/18/2019  . Head and neck cancer ([place]) 02/18/2019  . Malignant neoplasm of supraglottis ([place]) 01/22/2019  . Stroke ([place]) 07/31/2018  . Rotator cuff syndrome of right shoulder 02/20/2013  . Bradycardia 11/06/2012  . Coronary artery disease   . Abnormal finding on cardiovascular stress test 09/25/2012  . Hypertension 09/03/2012  . Chest pain 09/03/2012  . Alcohol abuse, daily use 09/03/2012  . Hyperlipidemia 09/03/2012  . Hyponatremia 09/03/2012   PAST MEDICAL HISTORY:  Active Ambulatory Problems    Diagnosis Date Noted  . Hypertension 09/03/2012  . Chest pain 09/03/2012  . Alcohol abuse, daily use 09/03/2012  . Hyperlipidemia 09/03/2012  . Hyponatremia 09/03/2012  . Abnormal finding on cardiovascular stress test 09/25/2012  . Coronary artery disease   . Bradycardia 11/06/2012  . Rotator cuff syndrome of right shoulder 02/20/2013  . Stroke ([place]) 07/31/2018  . Malignant neoplasm of supraglottis ([place]) 01/22/2019  . Hx of tracheostomy 02/18/2019  . Head and neck cancer ([place]) 02/18/2019  . RLL pneumonia 03/17/2019  .  Dehydration 03/28/2019  . GI bleed 04/01/2019  . Moderate protein-calorie malnutrition ([place]) 04/03/2019  . Febrile illness, acute 04/08/2019  . HCAP (healthcare-associated pneumonia) 04/10/2019  . PEG tube malfunction ([place]) 08/10/2019  . PEG  (percutaneous endoscopic gastrostomy) adjustment/replacement/removal ([place]) 08/11/2019  . Hypokalemia 04/13/2020  . Diarrhea 04/13/2020  . Hypomagnesemia 04/13/2020  . Syncope 04/30/2020  . Generalized weakness 05/01/2020  . Orthostasis   . Orthostatic hypotension 05/01/2020   Resolved Ambulatory Problems    Diagnosis Date Noted  . No Resolved Ambulatory Problems   Past Medical History:  Diagnosis Date  . Anemia   . Anxiety   . Arthritis   . Cancer of larynx ([place])   . Carotid artery stenosis   . Chronic lower back pain   . Depression   . Dilated aortic root ([place])   . GERD (gastroesophageal reflux disease)   . History of hiatal hernia   . Hypercholesteremia   . Pneumonia   . S/P percutaneous endoscopic gastrostomy (PEG) tube placement [facility])    SOCIAL HX:  Social History   Tobacco Use  . Smoking status: Former Smoker    Packs/day: 3.00    Years: 32.00    Pack years: 96.00    Types: Cigarettes    Start date: 08/29/1977    Quit date: 11/29/2006    Years since quitting: 13.4  . Smokeless tobacco: Former Systems developer    Types: Chew    Quit date: 2003  . Tobacco comment: 11/05/2012 "chewed tobacco when I play ball; haven't chewed since age 33"  Substance Use Topics  . Alcohol use: Yes    Alcohol/week: 12.0 standard drinks    Types: 12 Cans of beer per week    Comment: per wife [name] 6-7/week now as of 02/24/19   FAMILY HX:  Family History  Problem Relation Age of Onset  . Hypertension Mother   . Heart disease Father   . Hypertension Father   . Heart disease Sister   . Hypertension Maternal Grandmother   . Hypertension Maternal Grandfather   . Hypertension Paternal Grandmother   . Hypertension Paternal Grandfather   . Pancreatic cancer Maternal Uncle   . Breast cancer Maternal Aunt   . Breast cancer Maternal Aunt   . Diabetes Paternal Uncle   . Diabetes Paternal Aunt     ALLERGIES:  Allergies  Allergen Reactions  . Cymbalta [Duloxetine Hcl]   . Duloxetine Other  (See Comments)    Caused depression/ patient is taking  . Prednisone Other (See Comments)    Chest pain     PERTINENT MEDICATIONS:  Outpatient Encounter Medications as of 05/26/2020  Medication Sig  . acetaminophen (TYLENOL) 325 MG tablet Take 1.5 tablets (487.5 mg total) by mouth every 6 (six) hours as needed for mild pain, fever or headache (or Fever >/= 101).  Marland Kitchen aspirin EC 81 MG tablet Take 1 tablet (81 mg total) by mouth daily with breakfast.  . atorvastatin (LIPITOR) 40 MG tablet Take 80 mg by mouth at bedtime.  . dronabinol (MARINOL) 5 MG capsule Take 1 capsule (5 mg total) by mouth 2 (two) times daily before a meal.  . feeding supplement (ENSURE ENLIVE / ENSURE PLUS) LIQD Take 237 mLs by mouth 2 (two) times daily between meals.  Marland Kitchen HYDROcodone-acetaminophen (NORCO) 10-325 MG tablet Take 1 tablet by mouth every 4 (four) hours as needed.  . metoprolol tartrate (LOPRESSOR) 25 MG tablet Take 12.5 mg by mouth 2 (two) times daily.  . mirtazapine (REMERON) 7.5 MG tablet Take  1 tablet (7.5 mg total) by mouth at bedtime.  . nitroGLYCERIN (NITROSTAT) 0.4 MG SL tablet Place 1 tablet (0.4 mg total) under the tongue every 5 (five) minutes as needed for chest pain (CP or SOB).  . potassium chloride SA (KLOR-CON) 20 MEQ tablet Take 20 mEq by mouth daily.  . prochlorperazine (COMPAZINE) 10 MG tablet TAKE 1 TABLET(10 MG) BY MOUTH EVERY 6 HOURS AS NEEDED FOR NAUSEA (Patient taking differently: Take 10 mg by mouth every 6 (six) hours as needed for nausea.)  . valproic acid (DEPAKENE) 250 MG/5ML solution Take 10 mLs (500 mg total) by mouth 3 (three) times daily.   No facility-administered encounter medications on file as of 05/26/2020.   Thank you for the opportunity to participate in the care of Mr. Seth.  The palliative care team will continue to follow. Please call our office at [phone] if we can be of additional assistance.   Jari Favre, DNP, AGPCNP-BC  COVID-19 PATIENT SCREENING  TOOL Asked and negative response unless otherwise noted:   Have you had symptoms of covid, tested positive or been in contact with someone with symptoms/positive test in the past 5-10 days?

## 2020-05-29 ENCOUNTER — Encounter (HOSPITAL_COMMUNITY): Payer: Self-pay | Admitting: Hematology

## 2020-05-29 ENCOUNTER — Encounter (HOSPITAL_COMMUNITY): Payer: Self-pay

## 2020-05-29 ENCOUNTER — Emergency Department (HOSPITAL_COMMUNITY): Payer: 59

## 2020-05-29 ENCOUNTER — Emergency Department (HOSPITAL_COMMUNITY)
Admission: EM | Admit: 2020-05-29 | Discharge: 2020-05-29 | Disposition: A | Discharge status: Home / Self Care | Payer: 59 | Attending: Emergency Medicine | Admitting: Emergency Medicine

## 2020-05-29 ENCOUNTER — Other Ambulatory Visit: Payer: Self-pay

## 2020-05-29 DIAGNOSIS — Z87891 Personal history of nicotine dependence: Secondary | ICD-10-CM | POA: Diagnosis not present

## 2020-05-29 DIAGNOSIS — Z20822 Contact with and (suspected) exposure to covid-19: Secondary | ICD-10-CM | POA: Insufficient documentation

## 2020-05-29 DIAGNOSIS — Z85828 Personal history of other malignant neoplasm of skin: Secondary | ICD-10-CM | POA: Diagnosis not present

## 2020-05-29 DIAGNOSIS — Z79899 Other long term (current) drug therapy: Secondary | ICD-10-CM | POA: Diagnosis not present

## 2020-05-29 DIAGNOSIS — G4710 Hypersomnia, unspecified: Secondary | ICD-10-CM | POA: Diagnosis not present

## 2020-05-29 DIAGNOSIS — E871 Hypo-osmolality and hyponatremia: Secondary | ICD-10-CM | POA: Insufficient documentation

## 2020-05-29 DIAGNOSIS — R400 Somnolence: Secondary | ICD-10-CM

## 2020-05-29 DIAGNOSIS — I2510 Atherosclerotic heart disease of native coronary artery without angina pectoris: Secondary | ICD-10-CM | POA: Insufficient documentation

## 2020-05-29 DIAGNOSIS — Z8521 Personal history of malignant neoplasm of larynx: Secondary | ICD-10-CM | POA: Diagnosis not present

## 2020-05-29 DIAGNOSIS — Z7982 Long term (current) use of aspirin: Secondary | ICD-10-CM | POA: Diagnosis not present

## 2020-05-29 DIAGNOSIS — R531 Weakness: Secondary | ICD-10-CM | POA: Diagnosis present

## 2020-05-29 DIAGNOSIS — I10 Essential (primary) hypertension: Secondary | ICD-10-CM | POA: Diagnosis not present

## 2020-05-29 LAB — URINALYSIS, ROUTINE W REFLEX MICROSCOPIC
Bilirubin Urine: NEGATIVE
Glucose, UA: NEGATIVE mg/dL
Hgb urine dipstick: NEGATIVE
Ketones, ur: NEGATIVE mg/dL
Leukocytes,Ua: NEGATIVE
Nitrite: NEGATIVE
Protein, ur: NEGATIVE mg/dL
Specific Gravity, Urine: 1.016 (ref 1.005–1.030)
pH: 7 (ref 5.0–8.0)

## 2020-05-29 LAB — CBC WITH DIFFERENTIAL/PLATELET
Abs Immature Granulocytes: 0.04 10*3/uL (ref 0.00–0.07)
Basophils Absolute: 0 10*3/uL (ref 0.0–0.1)
Basophils Relative: 0 %
Eosinophils Absolute: 0.1 10*3/uL (ref 0.0–0.5)
Eosinophils Relative: 1 %
HCT: 31.1 % — ABNORMAL LOW (ref 39.0–52.0)
Hemoglobin: 9.8 g/dL — ABNORMAL LOW (ref 13.0–17.0)
Immature Granulocytes: 1 %
Lymphocytes Relative: 13 %
Lymphs Abs: 0.9 10*3/uL (ref 0.7–4.0)
MCH: 32.7 pg (ref 26.0–34.0)
MCHC: 31.5 g/dL (ref 30.0–36.0)
MCV: 103.7 fL — ABNORMAL HIGH (ref 80.0–100.0)
Monocytes Absolute: 0.7 10*3/uL (ref 0.1–1.0)
Monocytes Relative: 10 %
Neutro Abs: 5 10*3/uL (ref 1.7–7.7)
Neutrophils Relative %: 75 %
Platelets: 133 10*3/uL — ABNORMAL LOW (ref 150–400)
RBC: 3 MIL/uL — ABNORMAL LOW (ref 4.22–5.81)
RDW: 15.5 % (ref 11.5–15.5)
WBC: 6.6 10*3/uL (ref 4.0–10.5)
nRBC: 0 % (ref 0.0–0.2)

## 2020-05-29 LAB — COMPREHENSIVE METABOLIC PANEL
ALT: 6 U/L (ref 0–44)
AST: 13 U/L — ABNORMAL LOW (ref 15–41)
Albumin: 1.9 g/dL — ABNORMAL LOW (ref 3.5–5.0)
Alkaline Phosphatase: 176 U/L — ABNORMAL HIGH (ref 38–126)
Anion gap: 3 — ABNORMAL LOW (ref 5–15)
BUN: 9 mg/dL (ref 6–20)
CO2: 31 mmol/L (ref 22–32)
Calcium: 7.7 mg/dL — ABNORMAL LOW (ref 8.9–10.3)
Chloride: 95 mmol/L — ABNORMAL LOW (ref 98–111)
Creatinine, Ser: 0.74 mg/dL (ref 0.61–1.24)
GFR, Estimated: >60 mL/min (ref >60)
Glucose, Bld: 82 mg/dL (ref 70–99)
Potassium: 4.3 mmol/L (ref 3.5–5.1)
Sodium: 129 mmol/L — ABNORMAL LOW (ref 135–145)
Total Bilirubin: 0.5 mg/dL (ref 0.3–1.2)
Total Protein: 5.4 g/dL — ABNORMAL LOW (ref 6.5–8.1)

## 2020-05-29 LAB — RESP PANEL BY RT-PCR (FLU A&B, COVID) ARPGX2
Influenza A by PCR: NEGATIVE
Influenza B by PCR: NEGATIVE
SARS Coronavirus 2 by RT PCR: NEGATIVE

## 2020-05-29 LAB — MAGNESIUM: Magnesium: 1.8 mg/dL (ref 1.7–2.4)

## 2020-05-29 LAB — VALPROIC ACID LEVEL: Valproic Acid Lvl: 55 ug/mL (ref 50.0–100.0)

## 2020-05-29 MED ORDER — ESCITALOPRAM OXALATE 10 MG PO TABS: 10.0000 mg | ORAL_TABLET | Freq: Every day | ORAL | 1 refills | Status: DC

## 2020-05-29 NOTE — ED Notes (Signed)
MD in room at time of triage to complete MSE.

## 2020-05-29 NOTE — Discharge Instructions (Signed)
It is not clear what is causing the weakness and sleepiness but this may be related to depression.  I would like for you to follow-up with your cancer doctor this week.  Please call Dr. Tomie China office to make the next available appointment.  I would also like for you to start to use a little bit more salt on your food just this week, you will need your sodium level rechecked within 1 week  I have also added a medication called Lexapro which is used to treat depression.  Start taking 1 tablet a day, stop this immediately if you develop side effects, otherwise follow-up with your doctor and discuss with them the use of this medication.  If you should develop severe or worsening symptoms return to the emergency department.

## 2020-05-29 NOTE — ED Triage Notes (Signed)
Patient to ED via EMS with no specific complaints other than he currently has cancer and feels theres nothing left to be done and he wants help to be "comfortable." Denies any pain. States "I feel like I'm ready to be with the Lord." Denies SI. States that he has spoken with Hospice this past Friday and believes they are arranging services.

## 2020-05-29 NOTE — ED Provider Notes (Signed)
[facility] EMERGENCY DEPARTMENT Provider Note   CSN: [id number] Arrival date & time: 05/29/20  1414     History Chief Complaint  Patient presents with  . Weakness    [name] is a 60 y.o. male.  HPI   This patient is a 60 year old male who unfortunately has a history of laryngeal cancer, history of coronary disease status post stenting in 2014, history of hypertension, recurrent pneumonia, prior stroke and who lives with his wife [name].  The patient has recently been seen by his oncologist because of his cancer, this was Dr. Delton Coombes who he saw on May 19 of this year approximately 10 days ago.  He has gone through chemotherapy, he no longer gets treatment for the cancer.  This is a squamous cell carcinoma.  The patient does take a mix of medications including Depakote, Compazine, Remeron, metoprolol, he also takes Ensure shakes.  The patient had a CT scan of the neck and chest on May 16, the neck CT scan showed it posttreatment changes with decreased laryngeal edema and no evidence or recurrence of disease in the neck, the CT scan of the chest showed enlargement of a right apical and left lower lobe pulmonary nodule suspicious for metastatic disease also evidence of aspiration  MRI from May 12, 2020 showed no signs of metastatic disease to the brain, just generalized cerebral cortical atrophy and chronic small vessel disease.  The patient arrives by EMS transport today, it is unclear exactly why he is here, he reported to the paramedics that he was feeling weak, he cannot tell me why he is here he states he has no pain he is not vomiting he is not short of breath he is not coughing more than usual and has no fevers chills nausea vomiting or diarrhea.  His appetite is generally decreased, he feels like he is wasting away, he states "I do not know what to live for anymore" but denies being suicidal or depressed.  He does not know when he is here for as far as his big picture but states  he would like to entertain hospice  Past Medical History:  Diagnosis Date  . Anemia   . Anxiety   . Arthritis   . Cancer of larynx ([place])   . Carotid artery stenosis   . Chronic lower back pain   . Coronary artery disease    a. Diag cath 10/2012 for CP/abnormal nuc -> PCI s/p LAD atherectomy/DES placement 11/05/12.  . Depression   . Dilated aortic root ([place])   . GERD (gastroesophageal reflux disease)   . History of hiatal hernia   . Hypercholesteremia   . Hypertension   . Pneumonia   . S/P percutaneous endoscopic gastrostomy (PEG) tube placement ([place])   . Stroke [facility]) 07/31/2018   Pons    Patient Active Problem List   Diagnosis Date Noted  . Generalized weakness 05/01/2020  . Orthostatic hypotension 05/01/2020  . Orthostasis   . Syncope 04/30/2020  . Hypokalemia 04/13/2020  . Diarrhea 04/13/2020  . Hypomagnesemia 04/13/2020  . PEG (percutaneous endoscopic gastrostomy) adjustment/replacement/removal ([place]) 08/11/2019  . PEG tube malfunction ([place]) 08/10/2019  . HCAP (healthcare-associated pneumonia) 04/10/2019  . Febrile illness, acute 04/08/2019  . Moderate protein-calorie malnutrition ([place]) 04/03/2019  . GI bleed 04/01/2019  . Dehydration 03/28/2019  . RLL pneumonia 03/17/2019  . Hx of tracheostomy 02/18/2019  . Head and neck cancer ([place]) 02/18/2019  . 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 Stroke ([place]) 07/31/2018  .  Rotator cuff syndrome of right shoulder 02/20/2013  . Bradycardia 11/06/2012  . Coronary artery disease   . Abnormal finding on cardiovascular stress test 09/25/2012  . Hypertension 09/03/2012  . Chest pain 09/03/2012  . Alcohol abuse, daily use 09/03/2012  . Hyperlipidemia 09/03/2012  . Hyponatremia 09/03/2012    Past Surgical History:  Procedure Laterality Date  . BIOPSY  11/24/2019   Procedure: BIOPSY;  Surgeon: Rush Landmark Telford Nab., MD;  Location: [facility];  Service: Gastroenterology;;  . BIOPSY  03/29/2020   Procedure:  BIOPSY;  Surgeon: Irving Copas., MD;  Location: [facility];  Service: Gastroenterology;;  . CARDIAC CATHETERIZATION  10/29/2012  . COLONOSCOPY WITH PROPOFOL N/A 11/24/2019   Procedure: COLONOSCOPY WITH PROPOFOL;  Surgeon: Rush Landmark Telford Nab., MD;  Location: [place];  Service: Gastroenterology;  Laterality: N/A;  . CORONARY ANGIOPLASTY WITH STENT PLACEMENT  11/05/2012  . DIRECT LARYNGOSCOPY N/A 01/02/2019   Procedure: MICRO DIRECT LARYNGOSCOPY BIOPSY OF LARYNGEAL MASS;  Surgeon: [name], MD;  [facility];  Service: ENT;  Laterality: N/A;  . ESOPHAGOGASTRODUODENOSCOPY (EGD) WITH PROPOFOL N/A 08/11/2019   Procedure: ATTEMPTED ESOPHAGOGASTRODUODENOSCOPY (EGD) WITH PROPOFOL (UNABLE TO PERFORM PERCUTANEOUS ENDOSCOPIC GASTROSTOMY TUBE REPLACEMENT);  Surgeon: [name], MD;  Location: [facility];  Service: Gastroenterology;  Laterality: N/A;  . ESOPHAGOGASTRODUODENOSCOPY (EGD) WITH PROPOFOL N/A 11/24/2019   Procedure: ESOPHAGOGASTRODUODENOSCOPY (EGD) WITH PROPOFOL;  Surgeon: Rush Landmark Telford Nab., MD;  Location: [place];  Service: Gastroenterology;  Laterality: N/A;  . ESOPHAGOGASTRODUODENOSCOPY (EGD) WITH PROPOFOL N/A 01/26/2020   Procedure: ESOPHAGOGASTRODUODENOSCOPY (EGD) WITH PROPOFOL;  Surgeon: Rush Landmark Telford Nab., MD;  Location: [place];  Service: Gastroenterology;  Laterality: N/A;  . ESOPHAGOGASTRODUODENOSCOPY (EGD) WITH PROPOFOL N/A 03/29/2020   Procedure: ESOPHAGOGASTRODUODENOSCOPY (EGD) WITH PROPOFOL;  Surgeon: Rush Landmark Telford Nab., MD;  Location: [facility];  Service: Gastroenterology;  Laterality: N/A;  NEEDS FLOURO  . HEMORRHOID SURGERY  ~ 2010  . INSERTION OF MESH N/A 06/02/2015   Procedure: INSERTION OF MESH;  Surgeon: [name], MD;  Location: [facility];  Service: General;  Laterality: N/A;  . IR ANGIO INTRA EXTRACRAN SEL COM CAROTID INNOMINATE BILAT MOD SED  08/02/2018  . IR ANGIO VERTEBRAL SEL VERTEBRAL BILAT MOD SED  08/02/2018  . IR REPLACE  G-TUBE SIMPLE WO FLUORO  08/12/2019  . IR REPLC GASTRO/COLONIC TUBE PERCUT W/FLUORO  08/13/2019  . LAPAROSCOPIC INSERTION GASTROSTOMY TUBE N/A 02/24/2019   Procedure: LAPAROSCOPIC INSERTION GASTROSTOMY TUBE;  Surgeon: [name], MD;  Location: [place];  Service: General;  Laterality: N/A;  . LEFT HEART CATHETERIZATION WITH CORONARY ANGIOGRAM N/A 10/30/2012   Procedure: LEFT HEART CATHETERIZATION WITH CORONARY ANGIOGRAM;  Surgeon: [name], MD;  Location: [facility];  Service: Cardiovascular;  Laterality: N/A;  . MULTIPLE EXTRACTIONS WITH ALVEOLOPLASTY N/A 02/18/2019   Procedure: Extraction of tooth #'s 5-7, 10,11,14,20-29, 31 and 32 with alveoloplasty and maxiillary left buccal exostoses reductions;  Surgeon: [name], [name];  Location: [place];  Service: Oral Surgery;  Laterality: N/A;  . PEG PLACEMENT N/A 01/26/2020   Procedure: PERCUTANEOUS ENDOSCOPIC GASTROSTOMY (PEG) REPLACEMENT;  Surgeon: Irving Copas., MD;  Location: [facility];  Service: Gastroenterology;  Laterality: N/A;  . PERCUTANEOUS CORONARY ROTOBLATOR INTERVENTION (PCI-R) N/A 11/05/2012   Procedure: PERCUTANEOUS CORONARY ROTOBLATOR INTERVENTION (PCI-R);  Surgeon: [name], MD;  Location: [facility] LAB;  Service: Cardiovascular;  Laterality: N/A;  . SAVORY DILATION N/A 11/24/2019   Procedure: SAVORY DILATION;  Surgeon: Irving Copas., MD;  Location: [facility];  Service: Gastroenterology;  Laterality: N/A;  .  SAVORY DILATION N/A 01/26/2020   Procedure: SAVORY DILATION;  Surgeon: Rush Landmark Telford Nab., MD;  Location: [place];  Service: Gastroenterology;  Laterality: N/A;  . TRACHEOSTOMY TUBE PLACEMENT N/A 02/18/2019   Procedure: Tracheostomy;  Surgeon: [name], MD;  Location: [place];  Service: ENT;  Laterality: N/A;  . UMBILICAL HERNIA REPAIR N/A 06/02/2015   Procedure: UMBILICAL HERNIORRHAPHY WITH MESH;  Surgeon: [name], MD;  Location: [facility];  Service: General;  Laterality: N/A;        Family History  Problem Relation Age of Onset  . Hypertension Mother   . Heart disease Father   . Hypertension Father   . Heart disease Sister   . Hypertension Maternal Grandmother   . Hypertension Maternal Grandfather   . Hypertension Paternal Grandmother   . Hypertension Paternal Grandfather   . Pancreatic cancer Maternal Uncle   . Breast cancer Maternal Aunt   . Breast cancer Maternal Aunt   . Diabetes Paternal Uncle   . Diabetes Paternal Aunt     Social History   Tobacco Use  . Smoking status: Former Smoker    Packs/day: 3.00    Years: 32.00    Pack years: 96.00    Types: Cigarettes    Start date: 08/29/1977    Quit date: 11/29/2006    Years since quitting: 13.5  . Smokeless tobacco: Former Systems developer    Types: Chew    Quit date: 2003  . Tobacco comment: 11/05/2012 "chewed tobacco when I play ball; haven't chewed since age 59"  Vaping Use  . Vaping Use: Never used  Substance Use Topics  . Alcohol use: Yes    Alcohol/week: 12.0 standard drinks    Types: 12 Cans of beer per week    Comment: per wife [name] 6-7/week now as of 02/24/19  . Drug use: Not Currently    Types: Marijuana    Comment: Last use was on - denies on 04/08/19    Home Medications Prior to Admission medications   Medication Sig Start Date End Date Taking? Authorizing Provider  acetaminophen (TYLENOL) 325 MG tablet Take 1.5 tablets (487.5 mg total) by mouth every 6 (six) hours as needed for mild pain, fever or headache (or Fever >/= 101). 05/02/20  Yes Johnson, Clanford L, MD  aspirin EC 81 MG tablet Take 1 tablet (81 mg total) by mouth daily with breakfast. 04/15/20  Yes Emokpae, Courage, MD  atorvastatin (LIPITOR) 40 MG tablet Take 80 mg by mouth at bedtime. 01/17/20  Yes [provider]  diazepam (VALIUM) 10 MG tablet Take 10 mg by mouth 3 (three) times daily as needed. 05/25/20  Yes [provider]  dronabinol (MARINOL) 5 MG capsule Take 1 capsule (5 mg total) by mouth 2 (two)  times daily before a meal. 04/15/20  Yes Emokpae, Courage, MD  escitalopram (LEXAPRO) 10 MG tablet Take 1 tablet (10 mg total) by mouth daily. 05/29/20  Yes Noemi Chapel, MD  HYDROcodone-acetaminophen [facility]) 10-325 MG tablet Take 1 tablet by mouth every 4 (four) hours as needed. 06/02/15  Yes [name], MD  metoprolol tartrate (LOPRESSOR) 25 MG tablet Take 12.5 mg by mouth 2 (two) times daily.   Yes [provider]  mirtazapine (REMERON) 7.5 MG tablet Take 1 tablet (7.5 mg total) by mouth at bedtime. 04/15/20 04/15/21 Yes Emokpae, Courage, MD  nitroGLYCERIN (NITROSTAT) 0.4 MG SL tablet Place 1 tablet (0.4 mg total) under the tongue every 5 (five) minutes as needed for chest pain (CP or SOB). 07/27/14  Yes Herminio Commons, MD  potassium chloride SA (KLOR-CON) 20 MEQ tablet Take 20 mEq by mouth daily. 04/22/20  Yes [provider]  prochlorperazine (COMPAZINE) 10 MG tablet TAKE 1 TABLET(10 MG) BY MOUTH EVERY 6 HOURS AS NEEDED FOR NAUSEA Patient taking differently: Take 10 mg by mouth every 6 (six) hours as needed for nausea. 08/19/19  Yes Lockamy, Randi L, NP-C  valproic acid (DEPAKENE) 250 MG/5ML solution Take 10 mLs (500 mg total) by mouth 3 (three) times daily. 05/10/20  Yes Garvin Fila, MD  feeding supplement (ENSURE ENLIVE / ENSURE PLUS) LIQD Take 237 mLs by mouth 2 (two) times daily between meals. Patient not taking: Reported on 05/29/2020 05/02/20 06/01/20  Murlean Iba, MD    Allergies    Cymbalta [duloxetine hcl], Duloxetine, and Prednisone  Review of Systems   Review of Systems  All other systems reviewed and are negative.   Physical Exam Updated Vital Signs BP (!) 148/96   Pulse 61   Temp 98.1 F (36.7 C) (Oral)   Resp (!) 21   Ht 1.829 m (6')   Wt 50.8 kg   SpO2 98%   BMI 15.19 kg/m   Physical Exam Vitals and nursing note reviewed.  Constitutional:      General: He is not in acute distress.    Appearance: He is well-developed.  HENT:      Head: Normocephalic and atraumatic.     Mouth/Throat:     Pharynx: No oropharyngeal exudate.  Eyes:     General: No scleral icterus.       Right eye: No discharge.        Left eye: No discharge.     Conjunctiva/sclera: Conjunctivae normal.     Pupils: Pupils are equal, round, and reactive to light.  Neck:     Thyroid: No thyromegaly.     Vascular: No JVD.  Cardiovascular:     Rate and Rhythm: Normal rate and regular rhythm.     Heart sounds: Normal heart sounds. No murmur heard. No friction rub. No gallop.   Pulmonary:     Effort: Pulmonary effort is normal. No respiratory distress.     Breath sounds: No wheezing or rales.     Comments: There is no increased work of breathing, there is occasional coughing, phonation is normal, there is decreased breath sounds on the right Abdominal:     General: Bowel sounds are normal. There is no distension.     Palpations: Abdomen is soft. There is no mass.     Tenderness: There is no abdominal tenderness.  Musculoskeletal:        General: No tenderness. Normal range of motion.     Cervical back: Normal range of motion and neck supple.  Lymphadenopathy:     Cervical: No cervical adenopathy.  Skin:    General: Skin is warm and dry.     Findings: No erythema or rash.  Neurological:     Mental Status: He is alert.     Coordination: Coordination normal.  Psychiatric:        Behavior: Behavior normal.     ED Results / Procedures / Treatments   Labs (all labs ordered are listed, but only abnormal results are displayed) Labs Reviewed  CBC WITH DIFFERENTIAL/PLATELET - Abnormal; Notable for the following components:      Result Value   RBC 3.00 (*)    Hemoglobin 9.8 (*)    HCT 31.1 (*)    MCV 103.7 (*)  Platelets 133 (*)    All other components within normal limits  COMPREHENSIVE METABOLIC PANEL - Abnormal; Notable for the following components:   Sodium 129 (*)    Chloride 95 (*)    Calcium 7.7 (*)    Total Protein 5.4 (*)     Albumin 1.9 (*)    AST 13 (*)    Alkaline Phosphatase 176 (*)    Anion gap 3 (*)    All other components within normal limits  RESP PANEL BY RT-PCR (FLU A&B, COVID) [id number]  URINE CULTURE  MAGNESIUM  URINALYSIS, ROUTINE W REFLEX MICROSCOPIC  VALPROIC ACID LEVEL    EKG None  Radiology CT Head Wo Contrast  Result Date: 05/29/2020 CLINICAL DATA:  Mental status change. EXAM: CT HEAD WITHOUT CONTRAST TECHNIQUE: Contiguous axial images were obtained from the base of the skull through the vertex without intravenous contrast. COMPARISON:  April 30, 2020 FINDINGS: Brain: No evidence of acute infarction, hemorrhage, hydrocephalus, extra-axial collection or mass lesion/mass effect. Moderate brain parenchymal volume loss and mild deep white matter microangiopathy. Vascular: No hyperdense vessel or unexpected calcification. Skull: Normal. Negative for fracture or focal lesion. Sinuses/Orbits: No acute finding. Other: None. IMPRESSION: 1. No acute intracranial abnormality. 2. Moderate brain parenchymal atrophy and chronic microvascular disease. Electronically Signed   By: Fidela Salisbury M.D.   On: 05/29/2020 18:45   DG Chest Port 1 View  Result Date: 05/29/2020 CLINICAL DATA:  60 year old male with cough. Concern for aspiration. EXAM: PORTABLE CHEST 1 VIEW COMPARISON:  Chest radiograph dated 04/30/2020. FINDINGS: There are bibasilar dependent atelectasis. No focal consolidation, pleural effusion, or pneumothorax. Stable cardiac silhouette. Atherosclerotic calcification of the aorta. Osteopenia with degenerative changes of the spine. No acute osseous pathology. IMPRESSION: No active disease. Electronically Signed   By: Anner Crete M.D.   On: 05/29/2020 15:36    Procedures Procedures   Medications Ordered in ED Medications - No data to display  ED Course  I have reviewed the triage vital signs and the nursing notes.  Pertinent labs & imaging results that were available during my care of  the patient were reviewed by me and considered in my medical decision making (see chart for details).  Clinical Course as of 05/29/20 2032  Sat May 29, 2020  1610 No change in hemoglobin from prior study, chest x-ray without infiltrate [BM]    Clinical Course User Index [BM] Noemi Chapel, MD   MDM Rules/Calculators/A&P                          We will check an x-ray to make sure that the patient has no signs of pulmonary findings given his cough, given the decreased breath sounds on the right chest however he has no other findings on exam with regard to vital signs or physical exam that suggest another pathologic process.  He is not febrile tachycardic or hypoxic.  The patient appears generally weak we will need to get further collaborating information from the spouse or significant other  [name]: spouse - states he was trying to eat and he got choked - he wanted the EMS to be called because he was choking - and felt like he was dying - he slept all day yesterday - most of the night last night and all day today - he can't stay awake long enough to eat - he is very weak.  She heard him coughing - and saw him choking on food - on the  bed where he was sitting.  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  He has been awake and alert the whole time that I have been here taking care of him, he answers my questions appropriately now, he has had labs which are reassuring, no signs of infection in the urine, no signs of significant dehydration, no signs of stroke on CT scan or pneumonia on x-ray.  I discussed with the spouse that he can go home safely, she is concerned because he is not eating, I told him that he is already on Marinol, she needs to push food and fluids hard and they can follow-up with oncology this week.  We will also start  Lexapro for depression.    Final Clinical Impression(s) / ED Diagnoses Final diagnoses:  Hyponatremia  Sleepiness    Rx / DC Orders ED Discharge Orders         Ordered    escitalopram (LEXAPRO) 10 MG tablet  Daily        05/29/20 2030           Noemi Chapel, MD 05/29/20 2033

## 2020-06-01 ENCOUNTER — Telehealth: Payer: Self-pay

## 2020-06-01 ENCOUNTER — Telehealth: Payer: Self-pay | Admitting: Neurology

## 2020-06-01 ENCOUNTER — Ambulatory Visit: Payer: 59 | Admitting: Radiation Oncology

## 2020-06-01 ENCOUNTER — Ambulatory Visit: Payer: 59

## 2020-06-01 ENCOUNTER — Encounter: Payer: Self-pay | Admitting: General Practice

## 2020-06-01 NOTE — Telephone Encounter (Signed)
Pt states they need a refill on seizure medication called in to the [facility] on [address] In [place]. Please advise.

## 2020-06-01 NOTE — Progress Notes (Signed)
[facility] Spiritual Care Note  Following Mr [name]'s wife [name] by phone for caregiver support.  [place], [place], [facility] Pager [phone] Voicemail [phone]

## 2020-06-01 NOTE — Telephone Encounter (Signed)
Patient's wife called office and left a voicemail asking to reschedule her husband's EEG.  Please call.

## 2020-06-02 ENCOUNTER — Ambulatory Visit
Admission: RE | Admit: 2020-06-02 | Discharge: 2020-06-02 | Disposition: A | Discharge status: Home / Self Care | Payer: 59 | Source: Ambulatory Visit | Attending: Radiation Oncology | Admitting: Radiation Oncology

## 2020-06-02 ENCOUNTER — Other Ambulatory Visit: Payer: Self-pay | Admitting: Neurology

## 2020-06-02 DIAGNOSIS — C760 Malignant neoplasm of head, face and neck: Secondary | ICD-10-CM

## 2020-06-02 DIAGNOSIS — R918 Other nonspecific abnormal finding of lung field: Secondary | ICD-10-CM

## 2020-06-02 DIAGNOSIS — C321 Malignant neoplasm of supraglottis: Secondary | ICD-10-CM

## 2020-06-02 LAB — URINE CULTURE: Culture: 20000 — AB

## 2020-06-02 NOTE — Progress Notes (Signed)
Radiation Oncology         [phone] ________________________________  Re-Consultation Note by telephone.  The patient opted for telemedicine to maximize safety during the pandemic.  MyChart video was not obtainable.   Name: [name] MRN: [id number]  Date: 06/02/2020  DOB: [birth year]  CC:[name], MD  [name], MD   REFERRING PHYSICIAN: [name], MD  DIAGNOSIS:  C32.1    ICD-10-CM   1. Lung nodules  R91.8   2. Head and neck cancer ([employer])  C76.0   3. 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 male who was treated by me in 2021 for supraglottic throat cancer. Since then, he has been under surveillance by Dr. Delton Coombes.  In most recent history, the patient underwent a chest CT scan on 05/17/2020 that revealed enlargement of the right apical and left lower lobe pulmonary nodules, suspicious for metastatic disease. Soft tissue neck CT scan on that same day showed post-treatment changes with decreased laryngeal edema. There was no evidence of recurrent disease in the neck.  The patient was last seen by Dr. Delton Coombes on 05/20/2020, at which time it was recommended that he been seen by radiation oncology for consideration of SBRT to the enlarging lung lesions.  I have personally reviewed his serial images since his restaging PET scan.  The right apical and left lower lung nodules appear suspicious.  He has some prominent mediastinal lymph nodes that are nonspecific.   Patient's main complaints related to symptomatic tumor(s) are: Patient denies any shortness of breath of difficulty breathing. Does occasionally have a productive  cough. Reports sputum is clear and thin  Pain on a scale of 0-10 is: Patient denies    Ambulatory status? Walker? Wheelchair?: Reports he alternates between a cane and a walker to move around  SAFETY ISSUES:  Prior radiation? Yes: 03/05/2019 through 05/08/2019 Site Technique Total Dose (Gy) Dose per Fx (Gy) Completed Fx Beam Energies  Larynx: HN_larynx IMRT 70/70 2 35/35 6X    Pacemaker/ICD? No  Possible current pregnancy? N/A  Is the patient on methotrexate? No  Additional Complaints / other details:  Wife reports patient has been struggling with depression and fear of prognosis    PREVIOUS RADIATION THERAPY: Yes  Diagnosis: Malignant neoplasm of the supraglottis  Intent: Curative  Radiation Treatment Dates: 03/05/2019 through 05/08/2019  Site / Technique: Larynx / IMRT  Dose: 70 Gy given in 35 fractions  Beam Energies: 6X   PAST MEDICAL HISTORY:  has a past medical history of Anemia, Anxiety, Arthritis, Cancer of larynx ([place]), Carotid artery stenosis, Chronic lower back pain, Coronary artery disease, Depression, Dilated aortic root ([place]), GERD (gastroesophageal reflux disease), History of hiatal hernia, Hypercholesteremia, Hypertension, Pneumonia, S/P percutaneous endoscopic gastrostomy (PEG) tube placement ([place]), and Stroke ([place]) (07/31/2018).    PAST SURGICAL HISTORY: Past Surgical History:  Procedure Laterality Date  . BIOPSY  11/24/2019   Procedure: BIOPSY;  Surgeon: Rush Landmark Telford Nab., MD;  Location: [place];  Service: Gastroenterology;;  . BIOPSY  03/29/2020   Procedure: BIOPSY;  Surgeon: Irving Copas., MD;  Location: [facility];  Service: Gastroenterology;;  . CARDIAC CATHETERIZATION  10/29/2012  . COLONOSCOPY WITH PROPOFOL N/A  11/24/2019   Procedure: COLONOSCOPY WITH PROPOFOL;  Surgeon: Rush Landmark Telford Nab., MD;  Location: [place];  Service: Gastroenterology;  Laterality: N/A;  . CORONARY ANGIOPLASTY WITH STENT PLACEMENT  11/05/2012   . DIRECT LARYNGOSCOPY N/A 01/02/2019   Procedure: MICRO DIRECT LARYNGOSCOPY BIOPSY OF LARYNGEAL MASS;  Surgeon: [name], MD;  [facility];  Service: ENT;  Laterality: N/A;  . ESOPHAGOGASTRODUODENOSCOPY (EGD) WITH PROPOFOL N/A 08/11/2019   Procedure: ATTEMPTED ESOPHAGOGASTRODUODENOSCOPY (EGD) WITH PROPOFOL (UNABLE TO PERFORM PERCUTANEOUS ENDOSCOPIC GASTROSTOMY TUBE REPLACEMENT);  Surgeon: [name], MD;  Location: [facility];  Service: Gastroenterology;  Laterality: N/A;  . ESOPHAGOGASTRODUODENOSCOPY (EGD) WITH PROPOFOL N/A 11/24/2019   Procedure: ESOPHAGOGASTRODUODENOSCOPY (EGD) WITH PROPOFOL;  Surgeon: Rush Landmark Telford Nab., MD;  Location: [place];  Service: Gastroenterology;  Laterality: N/A;  . ESOPHAGOGASTRODUODENOSCOPY (EGD) WITH PROPOFOL N/A 01/26/2020   Procedure: ESOPHAGOGASTRODUODENOSCOPY (EGD) WITH PROPOFOL;  Surgeon: Rush Landmark Telford Nab., MD;  Location: [place];  Service: Gastroenterology;  Laterality: N/A;  . ESOPHAGOGASTRODUODENOSCOPY (EGD) WITH PROPOFOL N/A 03/29/2020   Procedure: ESOPHAGOGASTRODUODENOSCOPY (EGD) WITH PROPOFOL;  Surgeon: Rush Landmark Telford Nab., MD;  Location: [facility];  Service: Gastroenterology;  Laterality: N/A;  NEEDS FLOURO  . HEMORRHOID SURGERY  ~ 2010  . INSERTION OF MESH N/A 06/02/2015   Procedure: INSERTION OF MESH;  Surgeon: [name], MD;  Location: [facility];  Service: General;  Laterality: N/A;  . IR ANGIO INTRA EXTRACRAN SEL COM CAROTID INNOMINATE BILAT MOD SED  08/02/2018  . IR ANGIO VERTEBRAL SEL VERTEBRAL BILAT MOD SED  08/02/2018  . IR REPLACE G-TUBE SIMPLE WO FLUORO  08/12/2019  . IR REPLC GASTRO/COLONIC TUBE PERCUT W/FLUORO  08/13/2019  . LAPAROSCOPIC INSERTION GASTROSTOMY TUBE N/A 02/24/2019   Procedure: LAPAROSCOPIC INSERTION GASTROSTOMY TUBE;  Surgeon: [name], MD;  Location: [place];  Service: General;  Laterality: N/A;  . LEFT HEART CATHETERIZATION WITH CORONARY ANGIOGRAM N/A 10/30/2012   Procedure: LEFT HEART  CATHETERIZATION WITH CORONARY ANGIOGRAM;  Surgeon: [name], MD;  Location: [facility];  Service: Cardiovascular;  Laterality: N/A;  . MULTIPLE EXTRACTIONS WITH ALVEOLOPLASTY N/A 02/18/2019   Procedure: Extraction of tooth #'s 5-7, 10,11,14,20-29, 31 and 32 with alveoloplasty and maxiillary left buccal exostoses reductions;  Surgeon: [name], [name];  Location: [place];  Service: Oral Surgery;  Laterality: N/A;  . PEG PLACEMENT N/A 01/26/2020   Procedure: PERCUTANEOUS ENDOSCOPIC GASTROSTOMY (PEG) REPLACEMENT;  Surgeon: Irving Copas., MD;  Location: [place];  Service: Gastroenterology;  Laterality: N/A;  . PERCUTANEOUS CORONARY ROTOBLATOR INTERVENTION (PCI-R) N/A 11/05/2012   Procedure: PERCUTANEOUS CORONARY ROTOBLATOR INTERVENTION (PCI-R);  Surgeon: [name], MD;  Location: [facility];  Service: Cardiovascular;  Laterality: N/A;  . SAVORY DILATION N/A 11/24/2019   Procedure: SAVORY DILATION;  Surgeon: Irving Copas., MD;  Location: [place];  Service: Gastroenterology;  Laterality: N/A;  . SAVORY DILATION N/A 01/26/2020   Procedure: SAVORY DILATION;  Surgeon: Irving Copas., MD;  Location: [place];  Service: Gastroenterology;  Laterality: N/A;  . TRACHEOSTOMY TUBE PLACEMENT N/A 02/18/2019   Procedure: Tracheostomy;  Surgeon: [name], MD;  Location: [place];  Service: ENT;  Laterality: N/A;  . UMBILICAL HERNIA REPAIR N/A 06/02/2015   Procedure: UMBILICAL HERNIORRHAPHY WITH MESH;  Surgeon: [name], MD;  Location[facility];  Service: General;  Laterality: N/A;    FAMILY HISTORY: family history includes Breast cancer in his maternal aunt and maternal aunt; Diabetes in his paternal aunt and paternal uncle; Heart disease in his father and sister; Hypertension in  his father, maternal grandfather, maternal grandmother, mother, paternal grandfather, and paternal grandmother; Pancreatic cancer in his maternal uncle.  SOCIAL HISTORY:  reports that  he quit smoking about 13 years ago. His smoking use included cigarettes. He started smoking about 42 years ago. He has a 96.00 pack-year smoking history. He quit smokeless tobacco use about 19 years ago.  His smokeless tobacco use included chew. He reports current alcohol use of about 12.0 standard drinks of alcohol per week. He reports previous drug use. Drug: Marijuana.  ALLERGIES: Cymbalta [duloxetine hcl], Duloxetine, and Prednisone  MEDICATIONS:  Current Outpatient Medications  Medication Sig Dispense Refill  . acetaminophen (TYLENOL) 325 MG tablet Take 1.5 tablets (487.5 mg total) by mouth every 6 (six) hours as needed for mild pain, fever or headache (or Fever >/= 101).    Marland Kitchen aspirin EC 81 MG tablet Take 1 tablet (81 mg total) by mouth daily with breakfast. 30 tablet 4  . atorvastatin (LIPITOR) 40 MG tablet Take 80 mg by mouth at bedtime.    . diazepam (VALIUM) 10 MG tablet Take 10 mg by mouth 3 (three) times daily as needed.    . dronabinol (MARINOL) 5 MG capsule Take 1 capsule (5 mg total) by mouth 2 (two) times daily before a meal. (Patient not taking: Reported on 06/03/2020) 60 capsule 2  . DULoxetine (CYMBALTA) 60 MG capsule Take 60 mg by mouth daily.    Marland Kitchen escitalopram (LEXAPRO) 10 MG tablet Take 1 tablet (10 mg total) by mouth daily. 30 tablet 1  . HYDROcodone-acetaminophen (NORCO) 10-325 MG tablet Take 1 tablet by mouth every 4 (four) hours as needed. 50 tablet 0  . lactulose (CHRONULAC) 10 GM/15ML solution Take 30 mLs by mouth 4 (four) times daily as needed. (Patient not taking: Reported on 06/03/2020)    . LINZESS 290 MCG CAPS capsule Take 290 mcg by mouth daily. (Patient not taking: Reported on 06/03/2020)    . metoprolol tartrate (LOPRESSOR) 25 MG tablet Take 12.5 mg by mouth 2 (two) times daily.    . mirtazapine (REMERON) 7.5 MG tablet Take 1 tablet (7.5 mg total) by mouth at bedtime. 30 tablet 2  . nitroGLYCERIN (NITROSTAT) 0.4 MG SL tablet Place 1 tablet (0.4 mg total) under the  tongue every 5 (five) minutes as needed for chest pain (CP or SOB). (Patient not taking: Reported on 06/03/2020) 25 tablet 3  . omeprazole (PRILOSEC) 40 MG capsule Take 1 capsule by mouth 2 (two) times daily.    . potassium chloride SA (KLOR-CON) 20 MEQ tablet Take 20 mEq by mouth daily.    . prochlorperazine (COMPAZINE) 10 MG tablet TAKE 1 TABLET(10 MG) BY MOUTH EVERY 6 HOURS AS NEEDED FOR NAUSEA (Patient taking differently: Take 10 mg by mouth every 6 (six) hours as needed for nausea.) 60 tablet 2  . valproic acid (DEPAKENE) 250 MG/5ML solution Take 10 mLs (500 mg total) by mouth 3 (three) times daily. 600 mL 3   No current facility-administered medications for this encounter.    REVIEW OF SYSTEMS:  Notable for that above.   PHYSICAL EXAM:  vitals were not taken for this visit.   General: Alert and oriented, in no acute distress   LABORATORY DATA:  Lab Results  Component Value Date   WBC 6.6 05/29/2020   HGB 9.8 (L) 05/29/2020   HCT 31.1 (L) 05/29/2020   MCV 103.7 (H) 05/29/2020   PLT 133 (L) 05/29/2020   CMP     Component Value Date/Time   NA  129 (L) 05/29/2020 1508   K 4.3 05/29/2020 1508   CL 95 (L) 05/29/2020 1508   CO2 31 05/29/2020 1508   GLUCOSE 82 05/29/2020 1508   BUN 9 05/29/2020 1508   BUN 13 12/24/2018 0918   CREATININE 0.74 05/29/2020 1508   CREATININE 1.23 10/28/2012 1558   CALCIUM 7.7 (L) 05/29/2020 1508   PROT 5.4 (L) 05/29/2020 1508   ALBUMIN 1.9 (L) 05/29/2020 1508   AST 13 (L) 05/29/2020 1508   ALT 6 05/29/2020 1508   ALKPHOS 176 (H) 05/29/2020 1508   BILITOT 0.5 05/29/2020 1508   GFRNONAA >60 05/29/2020 1508   GFRAA >60 08/10/2019 1538         RADIOGRAPHY: CT Head Wo Contrast  Result Date: 05/29/2020 CLINICAL DATA:  Mental status change. EXAM: CT HEAD WITHOUT CONTRAST TECHNIQUE: Contiguous axial images were obtained from the base of the skull through the vertex without intravenous contrast. COMPARISON:  April 30, 2020 FINDINGS: Brain: No evidence  of acute infarction, hemorrhage, hydrocephalus, extra-axial collection or mass lesion/mass effect. Moderate brain parenchymal volume loss and mild deep white matter microangiopathy. Vascular: No hyperdense vessel or unexpected calcification. Skull: Normal. Negative for fracture or focal lesion. Sinuses/Orbits: No acute finding. Other: None. IMPRESSION: 1. No acute intracranial abnormality. 2. Moderate brain parenchymal atrophy and chronic microvascular disease. Electronically Signed   By: Fidela Salisbury M.D.   On: 05/29/2020 18:45   CT SOFT TISSUE NECK WO CONTRAST  Result Date: 05/18/2020 CLINICAL DATA:  Follow-up supraglottic squamous cell carcinoma. EXAM: CT NECK WITHOUT CONTRAST TECHNIQUE: Multidetector CT imaging of the neck was performed following the standard protocol without intravenous contrast. COMPARISON:  Contrast-enhanced neck CT 09/05/2019 FINDINGS: Pharynx and larynx: There is mild residual laryngeal edema, decreased from the prior study. No recurrent mass is identified within limitations of noncontrast technique. The airway is widely patent. Salivary glands: Post treatment changes primarily involving the submandibular glands. Thyroid: Unremarkable. Lymph nodes: No enlarged or suspicious lymph nodes in the neck. Vascular: Extensive cervical carotid atherosclerosis. Limited intracranial: Extensive calcified atherosclerosis of the internal carotid and vertebral arteries at the skull base. Visualized orbits: Unremarkable. Mastoids and visualized paranasal sinuses: Clear. Skeleton: No suspicious osseous lesion. Upper thoracic levoscoliosis. Moderate disc degeneration at C5-6 and C6-7. Upper chest: Reported separately. Other: None. IMPRESSION: Post treatment changes with decreased laryngeal edema. No evidence of recurrent disease in the neck. Electronically Signed   By: Logan Bores M.D.   On: 05/18/2020 15:42   CT CHEST WO CONTRAST  Result Date: 05/18/2020 CLINICAL DATA:  Follow-up of head  neck cancer. Chemotherapy and radiation therapy. Left-sided chest and rib pain after a fall 2 days ago. Follow-up of pulmonary nodule. EXAM: CT CHEST WITHOUT CONTRAST TECHNIQUE: Multidetector CT imaging of the chest was performed following the standard protocol without IV contrast. COMPARISON:  02/12/2020 FINDINGS: Cardiovascular: Advanced aortic and branch vessel atherosclerosis. Tortuous thoracic aorta. Mild cardiomegaly with trace pericardial fluid. Three vessel coronary artery calcification. Pulmonary artery enlargement, outflow tract 3.1 cm Mediastinum/Nodes: No mediastinal or definite hilar adenopathy, given limitations of unenhanced CT. Lungs/Pleura: No pleural fluid.  Moderate centrilobular emphysema. Fluid within the left lower lobe endobronchial tree and right lower lobe bronchus, new. Medial right apical pulmonary nodule measures 1.1 x 0.9 cm on 43/4 versus 9 x 8 mm on the prior exam (when remeasured). Left lower lobe 6 mm pulmonary nodule on 114/4 measures 4 mm on the prior exam. Development of multiple areas of basilar predominant ground-glass and less so airspace opacity. Some of  these are nodular including in the peribronchovascular left lower lobe on 110/4. A medial lingular subpleural 5 mm nodule on 120/4 is new. Upper Abdomen: Normal imaged portions of the liver, spleen, stomach, adrenal glands, kidneys. Musculoskeletal: Convex left thoracic spine curvature. IMPRESSION: 1. Enlargement of right apical and left lower lobe pulmonary nodules, again suspicious for metastatic disease. 2. Combination of findings, including fluid in the lower lobe endobronchial tree with basilar predominant areas of ground-glass, less so airspace disease, highly suspicious for aspiration. A lingular nodule could represent new metastatic disease are also be infectious/inflammatory. 3. No thoracic adenopathy. 4. Aortic atherosclerosis (ICD10-I70.0), coronary artery atherosclerosis and emphysema (ICD10-J43.9). 5. Pulmonary  artery enlargement suggests pulmonary arterial hypertension. 6. The left renal abnormality on the prior exam is not imaged today. Electronically Signed   By: Abigail Miyamoto M.D.   On: 05/18/2020 15:44   MR BRAIN W WO CONTRAST  Result Date: 05/14/2020  [facility] [address], [place], [place] [address] [phone] NEUROIMAGING REPORT STUDY DATE: 05/12/2020 PATIENT NAME: ROCKET GUNDERSON DOB: [birth year] MRN: [id number] ORDERING CLINICIAN: Dr. [name] CLINICAL HISTORY: 59 year old patient being evaluated for episode of loss of consciousness COMPARISON FILMS: MRI brain 07/31/2018 EXAM: MRI brain with and without contrast TECHNIQUE: MRI of the brain with and without contrast was obtained utilizing 5 mm axial slices with T1, T2, T2 flair, T2 star gradient echo and diffusion weighted views.  T1 sagittal, T2 coronal and postcontrast views in the axial and coronal plane were obtained. CONTRAST: IV MultiHance IMAGING SITE: [facility] FINDINGS: The brain parenchyma shows mild changes of chronic small vessel disease.  There is mild age advanced generalized cerebral cortical atrophy noted.  No other structural lesion, tumor or infarct is noted.  Subarachnoid space and ventricular system show slight dilatation proportionate to degree of cortical atrophy.  Calvarium shows no abnormalities.  Extra-axial brain structures appear normal.  Orbits appear unremarkable.  Paranasal sinuses show mild chronic inflammatory changes.  Pituitary gland and cerebellar tonsils appear normal.  Flow-voids of large vessels are intact and circulation appear to be patent.  Postcontrast images do not result in abnormal areas of enhancement.  Visualized portion of the upper cervical spine show no abnormalities.  The paranasal sinuses show mild chronic inflammatory changes.  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  No significant change compared with previous MRI from 07/31/2018 except evolutionary changes noted in the previously seen acute pontine infarct. INTERPRETING PHYSICIAN: Antony Contras, MD Certified in  Neuroimaging by [employer] and [employer]  DG Chest [place] 1 View  Result Date: 05/29/2020 CLINICAL DATA:  60 year old male with cough. Concern for aspiration. EXAM: PORTABLE CHEST 1 VIEW COMPARISON:  Chest radiograph dated 04/30/2020. FINDINGS: There are bibasilar dependent atelectasis. No focal consolidation, pleural effusion, or pneumothorax. Stable cardiac silhouette. Atherosclerotic calcification of the aorta. Osteopenia with degenerative changes of the spine. No acute osseous pathology. IMPRESSION: No active disease. Electronically Signed   By: Anner Crete M.D.   On: 05/29/2020 15:36      IMPRESSION/PLAN:  Throat cancer with enlarging pulmonary nodules /nonspecific mediastinal nodes  I reassured the patient that if he does have disease in his lungs that it is treatable.  It is not clear whether he has metastatic disease or a  second primary arising from the lung. He may be a candidate for SBRT but I would like to refer him to pulmonology so that we can obtain tissue from the dominant nodule in the right apical lung and also obtain tissue from some of his mediastinal lymph nodes.  That will help [place] determine the best strategy should we use targeted radiation.  I spoke personally with Dr Carney Corners and shared the patient's images w/ him by video conference. He is going to try to biopsy the apical lung nodule and also the nonspecific lymph nodes in his mediastinum.  This may be delayed until July when he has the optimal robotic instruments.  I do not think this is urgent so I told him to go at whatever pace the patient is comfortable  with.  We may end up getting a PET scan for full staging depending on the results.  I told [name] that I am hopeful we can do SBRT x 2 lesions, but certainly will adjust the recommendations if he has positive nodes.  ------------ This encounter was provided by telemedicine platform; patient desired telemedicine during pandemic precautions.  MyChart video was not available and therefore telephone was used. The patient has given verbal consent for this type of encounter and has been advised to only accept a meeting of this type in a secure network environment. On date of service, in total, I spent 40 minutes on this encounter.   The attendants for this meeting include Eppie Gibson  and [employer] During the encounter, Eppie Gibson was located at [facility] Radiation On[facility] Department.  [name] was located at home.    __________________________________________   Eppie Gibson, MD  This document serves as a record of services personally performed by Eppie Gibson, MD. It was created on his behalf by Clerance Lav, a trained medical scribe. The creation of this record is based on the scribe's personal observations and the provider's statements to them. This document has been checked and approved by the attending provider.

## 2020-06-02 NOTE — Progress Notes (Signed)
Histology and Location of Primary Cancer: Malignant neoplasm of supraglottis   Location(s) of Symptomatic tumor(s):  05/17/2020 --CT Chest w/o Contrast IMPRESSION: 1. Enlargement of right apical and left lower lobe pulmonary nodules, again suspicious for metastatic disease. 2. Combination of findings, including fluid in the lower lobe endobronchial tree with basilar predominant areas of ground-glass, less so airspace disease, highly suspicious for aspiration. A lingular nodule could represent new metastatic disease are also be infectious/inflammatory. 3. No thoracic adenopathy. 4. Aortic atherosclerosis (ICD10-I70.0), coronary artery atherosclerosis and emphysema (ICD10-J43.9). 5. Pulmonary artery enlargement suggests pulmonary arterial hypertension. 6. The left renal abnormality on the prior exam is not imaged today.  Past/Anticipated chemotherapy by medical oncology, if any:  Under care of Dr. [name] 05/20/2020 -Weekly cisplatin and radiation started on 03/07/2019, many interruptions due to hospitalizations. -XRT completed on 05/08/2019 and last weekly cisplatin on 05/15/2019. -Continue follow-up with Dr. Benjamine Mola and Dr. Isidore Moos. - Oropharyngeal or neck lymphadenopathy was not seen. - Reviewed CT soft tissue neck from 05/17/2020 which showed posttreatment changes with decreased laryngeal edema with no evidence of recurrence disease in the neck. - We will consider repeating another CT scan in 6 months.   -CT chest without contrast on 05/17/2020 shows right apical lung nodule measuring 1.1 x 0.9 cm, increased from 9 x 8 mm on prior exam.  Left lower lobe 6 mm lung nodule measured 4 mm on the prior exam. - These lung nodules are gradually increasing in size.  We discussed about SBRT to these lesions. - He adamantly refuses radiation. - Recommend follow-up with chest CT without contrast in 3 months.  Patient's main complaints related to symptomatic tumor(s) are: Patient denies any  shortness of breath of difficulty breathing. Does occasionally have a productive cough. Reports sputum is clear and thin  Pain on a scale of 0-10 is: Patient denies    Ambulatory status? Walker? Wheelchair?: Reports he alternates between a cane and a walker to move around  SAFETY ISSUES:  Prior radiation? Yes: 03/05/2019 through 05/08/2019 Site Technique Total Dose (Gy) Dose per Fx (Gy) Completed Fx Beam Energies  Larynx: HN_larynx IMRT 70/70 2 35/35 6X    Pacemaker/ICD? No  Possible current pregnancy? N/A  Is the patient on methotrexate? No  Additional Complaints / other details:  Wife reports patient has been struggling with depression and fear of prognosis

## 2020-06-02 NOTE — Telephone Encounter (Signed)
Called patient and spoke to her on the phone.  Prescription for valproic acid was ordered on 05/10/20 with 3 refills.  She didn't know she had to call pharmacy to get them to refill it.  Asked about EEG being scheduled.  Wife was informed their EEG is 06/09/20 @ 2p with arrival time of 145.    Patient denied further questions, verbalized understanding and expressed appreciation for the phone call.

## 2020-06-03 ENCOUNTER — Other Ambulatory Visit: Payer: Self-pay | Admitting: *Deleted

## 2020-06-03 ENCOUNTER — Telehealth: Payer: Self-pay | Admitting: Emergency Medicine

## 2020-06-03 ENCOUNTER — Other Ambulatory Visit: Payer: Self-pay

## 2020-06-03 ENCOUNTER — Telehealth: Payer: Self-pay

## 2020-06-03 ENCOUNTER — Other Ambulatory Visit: Payer: Medicaid Other | Admitting: Nurse Practitioner

## 2020-06-03 DIAGNOSIS — Z515 Encounter for palliative care: Secondary | ICD-10-CM

## 2020-06-03 DIAGNOSIS — C801 Malignant (primary) neoplasm, unspecified: Secondary | ICD-10-CM

## 2020-06-03 NOTE — Patient Outreach (Signed)
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  Encounter Medications:  Outpatient Encounter Medications as of 06/03/2020  Medication Sig Note  . acetaminophen (TYLENOL) 325 MG tablet Take 1.5 tablets (487.5 mg total) by mouth every 6 (six) hours as needed for mild pain, fever or headache (or Fever >/= 101).   Marland Kitchen aspirin EC 81 MG tablet Take 1 tablet (81 mg total) by mouth daily with breakfast.   . atorvastatin (LIPITOR) 40 MG tablet Take 80 mg by mouth at bedtime.   . diazepam (VALIUM) 10 MG tablet Take 10 mg by mouth 3 (three) times daily as needed. 06/03/2020: Takes 1/2 in am and a whole tab at bedtime.  . DULoxetine (CYMBALTA) 60 MG capsule Take 60 mg by mouth daily.   Marland Kitchen escitalopram (LEXAPRO) 10 MG tablet Take 1 tablet (10 mg total) by mouth daily.   Marland Kitchen HYDROcodone-acetaminophen (NORCO) 10-325 MG tablet Take 1 tablet by mouth every 4 (four) hours as needed.   . metoprolol tartrate (LOPRESSOR) 25 MG tablet Take 12.5 mg by mouth 2 (two) times daily.   . mirtazapine (REMERON) 7.5 MG tablet Take 1 tablet (7.5 mg total) by mouth at bedtime.   Marland Kitchen omeprazole (PRILOSEC) 40 MG capsule Take 1 capsule by mouth 2 (two) times daily.   . potassium chloride SA (KLOR-CON) 20 MEQ tablet Take 20 mEq by mouth daily.   . prochlorperazine (COMPAZINE) 10 MG tablet TAKE 1 TABLET(10 MG) BY MOUTH EVERY 6 HOURS AS NEEDED FOR NAUSEA (Patient taking differently: Take 10 mg by mouth every 6 (six) hours as needed for nausea.) 06/03/2020: Has been taking routinely in am. Advised wife that this is a prn medication and to give it to him when he feels queasy or nauseated.  . valproic acid (DEPAKENE) 250 MG/5ML solution Take 10 mLs (500 mg total) by mouth 3 (three) times daily.   Marland Kitchen dronabinol (MARINOL) 5 MG capsule Take 1 capsule (5 mg total) by mouth 2 (two) times daily before a meal. (Patient not  taking: Reported on 06/03/2020) 06/03/2020: Pt thought he choked on this pill but wife reports it was a piece of meat. He has been refusing to take it. Suggested she cut the pill and squeeze out the liquid and put it in his food.  . lactulose (CHRONULAC) 10 GM/15ML solution Take 30 mLs by mouth 4 (four) times daily as needed. (Patient not taking: Reported on 06/03/2020) 06/03/2020: Does not need for constipation at this time only using Miralax.  Marland Kitchen LINZESS 290 MCG CAPS capsule Take 290 mcg by mouth daily. (Patient not taking: Reported on 06/03/2020)   . nitroGLYCERIN (NITROSTAT) 0.4 MG SL tablet Place 1 tablet (0.4 mg total) under the tongue every 5 (five) minutes as needed for chest pain (CP or SOB). (Patient not taking: Reported on 06/03/2020) 09/30/2019: On hand   No facility-administered encounter medications on file as of 06/03/2020.    Functional Status:  In your present state of health, do you have any difficulty performing the following activities: 04/30/2020 04/13/2020  Hearing? Tempie Donning  Vision? Y Y  Difficulty concentrating or making decisions? N N  Walking or climbing stairs? Y Y  Dressing or bathing? N N  Doing errands, shopping? N N  Some recent data might be hidden    Fall/Depression Screening: Fall Risk  09/03/2019  Falls in the past year? 0  Number falls in past  yr: 0  Injury with Fall? 0  Risk for fall due to : Medication side effect  Follow up Falls evaluation completed   PHQ 2/9 Scores 09/05/2019 08/19/2019 05/26/2019 09/10/2018  PHQ - 2 Score 3 0 0 -  PHQ- 9 Score 9 - - -  Exception Documentation - - - Patient refusal    Assessment: New lung nodules, ? Mets                        Severe wt loss 40# in 3 months/Malnutrition                         Depression  Goals Addressed            This Visit's Progress   . COMPLETED: Maintain my quality of life as evidenced by getting back to doing things he hasn't done since he became sick.   Not on track    Follow Up Date  07/01/20   Go  Fishing. Meet up with friends for a meal. Play a game with the family.   Notes: Strive to exercise for at least 10 minutes everyday. Walking outdoors when weather permitting would be good for all around health improvement. Talk to your NP or MD as often as necessary and resume friendships that may have been on hold due to your treatment and COVID 19. 11/11/19 Pt sleeping most of the day, not doing usual activities he used to enjoy, very depressed. Wants to live out his life doing things, like drinking again as this is something that he values for his quality of life. Counseled that if there is ca and he doesn't want treatment, he will be free to do what he wants to do. The priority would be his safety and comfort. 12/03/19 Wife reports she feels he seems to be more hopeful since our last conversation. Encouraged outdoors activities with friends and family. 03/02/20 Wife notes small improvements in pts demeanor. He is getting out of his room more and interacting with family members. Encouraged now that the weather is getting nicer he should do the above mentioned activities to enjoy life! 06/03/20 [name] health has taken a turn for the worse in the last 3 months. He has had 2 hospital admissions and 2 ED visits. He has lost 40# since the last time we talked! He has been referred to palliative care and they have started care. Pt has new lung nodules that need to be evaluated by biopsy. If ca they will opt for radiation tx. Had long discussion >30 minutes with Mrs. Kuhner about his condition and likelihood of a positive response in light of his deterioration over the last 3 months. Listened to her and offered support. Praised her for all the care she has given her husband to follow instructions and make him comfortable. Since he has palliative care now I will close his case.    . COMPLETED: Manage My Emotions as evidenced by participating in family activites reported by wife.       Follow Up Date 06/02/20    Encourage activities with the family or one on one activities for quality time.   Notes: Pt will call and get appt for evaluation of depression by the end of 30 days. 11/11/19 It does not appear that primary care has addressed pt depression or given new medication for same. Encouraged him to try to do some of the things he enjoys like getting  outside and getting fresh air, taking a walk, listen to music. 12/03/19 Pt outlook improved due to ability to eat more and having family around on Thanksgiving. 03/02/20 Mrs. Tolen reports increased participation and less self isolation. He is doing some things he hasn't done in awhile that he enjoys. 06/03/20 Pt has taken a turn for the worse and is not participating like he been starting to do 3 months ago. He has now been put on an antidepressant regimen! Closing case due to start of palliative care services.    . COMPLETED: Patient Stated       Goal started 09/03/19 HIGH Priority Estimated end date: 03/01/20, Renewed 03/02/20.  [name] agrees to participate with [facility] for support and ongoing health needs over the next 6 months by pariicipating in quarterly calls..   09/03/19 Talked with Mrs. Perrelli today, pt ca recovery progressing, trach removed, scheduled for ST and other diagnostics this month. No problems for resolution today. 10/06/19 Talked with [name] reports continued depression, advised to made OV with primary care to assess and treat. Improving PO intake. CCS 11/11/19 - Pt and wife are worried about ca reoccurrence or mets. Pt exhibits continued signs of depression. States he would not want any further txs. Listened and supported him. 12/03/19 No signs of ca reoccurrence. Will touch base in another 3 months. Encouraged to call for questions and or support. 03/02/20 Although I do not speak with [name] on everycall, I speak with his wife who is able to make a good assessment and report back. Outreaches are very good for her as his primary care provider.  She agrees to continue on with every 3 month check ins. 06/03/20 Talked with Mrs. Ignasiak today and she gave me a complete report of the difficulties pt has had over the last 3 months. She did not reach out to me for assistance during this time. He now has palliative care services so [facility] will close his case.       Plan: Advised Mrs. Jesson I would close [name] case as he now has Palliative Care services which will take the place of [facility] and will be more involved with his care. Advised that his nurse is known to me and that she is an excellent advanced practice nurse and she will take care of their needs. Encouraged Mrs. Wheatley to call her whenever she needs something or when there is a problem. She said she would do so.  Follow-up:  Patient requests no follow-up at this time.  No follow up, case closed.  Eulah Pont. Myrtie Neither, MSN, [facility] Gerontological Nurse Practitioner [facility] Care Management [phone]

## 2020-06-03 NOTE — Telephone Encounter (Signed)
Post ED Visit - Positive Culture Follow-up  Culture report reviewed by antimicrobial stewardship pharmacist: [facility] Team []  Elenor Quinones, Pharm.D. []  Heide Guile, Pharm.D., BCPS AQ-ID []  Parks Neptune, Pharm.D., BCPS []  Alycia Rossetti, Pharm.D., BCPS []  [place], Pharm.D., BCPS, AAHIVP []  Legrand Como, Pharm.D., BCPS, AAHIVP []  Salome Arnt, PharmD, BCPS []  Johnnette Gourd, PharmD, BCPS []  Hughes Better, PharmD, BCPS []  Leeroy Cha, PharmD []  Laqueta Linden, PharmD, BCPS []  Albertina Parr, PharmD  [place] Team []  Leodis Sias, PharmD []  Lindell Spar, PharmD []  Royetta Asal, PharmD []  Graylin Shiver, Rph []  Rema Fendt) Glennon Mac, PharmD []  Arlyn Dunning, PharmD []  Netta Cedars, PharmD []  Dia Sitter, PharmD []  Leone Haven, PharmD []  Gretta Arab, PharmD []  Theodis Shove, PharmD []  Peggyann Juba, PharmD []  Reuel Boom, PharmD   Positive urine culture Treated with none, asymptomatic, no further patient follow-up is required at this time.  Hazle Nordmann 06/03/2020, 9:26 AM

## 2020-06-03 NOTE — Telephone Encounter (Signed)
I have called [name], pts wife and LM on VM to have her call the office back.   The appt the pt has on 6/6 will need to be changed to a slot on 6/9 per BI.

## 2020-06-03 NOTE — Telephone Encounter (Signed)
Called and spoke with wife [name] per DPR to get patient scheduled for an appointment with Dr. Valeta Harms. Patient is now scheduled for: Next Appt With Pulmonology ([place], DO) 06/07/2020 at 1:45 PM Nothing further needed at this time.

## 2020-06-03 NOTE — Telephone Encounter (Signed)
Dr. Valeta Harms as of right now next week you only have a few 15 minute slots open. Are you ok with 15 minute slot?

## 2020-06-03 NOTE — Progress Notes (Signed)
[facility] Consult Note Telephone: [phone]  Fax: [phone]    Date of encounter: 06/03/20 PATIENT NAME: [name] [id number]   [phone] (home)  DOB: February 11, 1960 MRN: [id number]  PRIMARY CARE PROVIDER:    [name], MD,  [address] [place] [address] [phone]  REFERRING PROVIDER:   [name], [address] [place],  [place] [address] [phone]  RESPONSIBLE PARTY:    Contact Information    Name Relation Home Work [name], [place] Spouse   [place] Daughter   [phone]   Jorge, [name] Father [phone]  [phone]     This is an audio telehealth visit from my office and it was consent by this patient and his family. HIPPA policies of confidentially were discussed.                                   ASSESSMENT AND PLAN / RECOMMENDATIONS:   Advance Care Planning/Goals of Care:  CODE STATUS: Full code Patient present in home, discussion held with wiife via telephone. Wife denied patient request for Hospice care as noted in yesterday ED visit report saying patient spoke with Dr. Isidore Moos yesterday and verbalized interest in pursuing further diagnostics for the pulmonary nodules and also interested in radiation treatment if offered. Wife report patient mentioned Hospice in ED because he was anxious and thought he was dying at the time. We discussed the implication of aggressive treatment in the setting of severe deconditioning related to severe protein calorie malnutrition. Wife report patient is being scheduled for broncoscopy to biopsy the lung nodule. Wife report they would find out after the Bronchoscopy if the nodule is cancerous or not. Wife report patient gets angry if the word Hospice or progression of diease is mentioned around patient. Discussed with wife and it was agreed that further discussion regarding advance care  planning will be deferred untill after the Broncoscopy. Provided general support and encouragement. Questions and concerns were addressed. Patient and family was encouraged to call with questions and/or concerns  Symptom Management/Plan: Constipation: wife report problem is resolved. Report patient having bowel movements daily, no diarhea. No report of nausea or vomiting. Advised to continue current plan of care with Miralax and Lactulose as needed.   Follow up Palliative Care Visit: Palliative care will continue to follow for complex medical decision making, advance care planning, and clarification of goals. Return in about 4 weeks or prn.  I spent 25 minutes providing this consultation. More than 50% of the time in this consultation was spent in counseling and care coordination.  PPS: 40%  HOSPICE ELIGIBILITY/DIAGNOSIS: TBD  CHIEF COMPLAIN: follow up on constipation  History obtained from review of [facility] EMR and discussion with patient's wife.   HISTORY OF PRESENT ILLNESS:  [name] is a 60 y.o. 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 Patient had complaints of constipation at last visit one week ago, he was seen by his PCP Dr. Gerarda Fraction and prescribed Lactulose and Miralax. Patient at the visit declined discussion regarding ACP. Wife report resolution of constipation. Patient had ED visit yesterday for weakness, he was evaluated and discharged same day as his diagnostics were reassuring. He was however noted to  have Hyponatremia with Sodium of 129.  Past Medical History:  Diagnosis Date  . Anemia   . Anxiety   . Arthritis   . Cancer of larynx ([place])   . Carotid artery stenosis   . Chronic lower back pain   . Coronary artery disease    a. Diag cath 10/2012 for CP/abnormal nuc -> PCI s/p LAD atherectomy/DES placement  11/05/12.  . Depression   . Dilated aortic root ([place])   . GERD (gastroesophageal reflux disease)   . History of hiatal hernia   . Hypercholesteremia   . Hypertension   . Pneumonia   . S/P percutaneous endoscopic gastrostomy (PEG) tube placement ([facility])   . Stroke [facility]) 07/31/2018   [place]   Thank you for the opportunity to participate in the care of [name].  The palliative care team will continue to follow. Please call our office at [phone] if we can be of additional assistance.   Jorge Favre, DNP, AGPCNP-BC  COVID-19 PATIENT SCREENING TOOL Asked and negative response unless otherwise noted:   Have you had symptoms of covid, tested positive or been in contact with someone with symptoms/positive test in the past 5-10 days?

## 2020-06-03 NOTE — Telephone Encounter (Signed)
-----   Message from Garner Nash, DO sent at 06/02/2020  3:35 PM EDT ----- Regarding: appt next week 9th  Please schedule with me next week to discuss bronchoscopy.   I have slots held. Please use one for this. I think I have plenty on the 9th   Thanks  Brad      ----- Message ----- From: Eppie Gibson, MD Sent: 06/02/2020   3:03 PM EDT To: [name], MD, Zola Button, RN, #  Brad, thanks for taking my call.  This is a patient we discussed you working in for bronchoscopy and biopsies.   Sreedhar, we agree that the lung nodules are growing suspiciously.  Leroy Sea is going to try to biopsy the apical lung nodule and also the nonspecific lymph nodes in his mediastinum.  This may be delayed until July when he has the optimal robotic instruments.  I do not think this is urgent so I told him to go at whatever pace the patient is comfortable with!  We may end up getting a PET scan for full staging depending on the results.  I told Mr. Desaulniers that I am hopeful we can do SBRT x 2 lesions, but certainly will adjust the recommendations if he has positive nodes.  Thanks!! Judson Roch

## 2020-06-03 NOTE — Telephone Encounter (Signed)
I apologize for that! I have held the 1:30 slot on 06/10/20 so we dont lose that slot and will try to get patient switched.

## 2020-06-03 NOTE — Telephone Encounter (Signed)
Yes ok for 15 min slot  Garner Nash, DO [facility] 06/03/2020 2:13 PM

## 2020-06-04 NOTE — Telephone Encounter (Signed)
Attempted to call patient on both numbers [phone] and [phone]- was unable to leave message mailbox was full.   Patient needs to be switched from 06/07/20 at 1:30 to 06/10/20 at 1:30 I have put a hold on that slot. If patient calls back please switch appointment.

## 2020-06-04 NOTE — Telephone Encounter (Signed)
Called and spoke with wife [name] per DPR and patient's appointment has been switched to Thursday 6/9 at 1:30. Nothing further needed at this time.

## 2020-06-07 ENCOUNTER — Telehealth: Payer: Self-pay | Admitting: Neurology

## 2020-06-07 ENCOUNTER — Ambulatory Visit: Payer: 59 | Admitting: Pulmonary Disease

## 2020-06-07 ENCOUNTER — Encounter: Payer: Self-pay | Admitting: Radiation Oncology

## 2020-06-07 NOTE — Telephone Encounter (Signed)
[name], Jorge(wife on DPR) has called to report that pt's daughter went and picked up the valproic acid (DEPAKENE) 250 MG/5ML solution not knowing that it could not be crushed.  Pt is only able to consume the liquid version of the medication.  Based on this wife asking if the liquid form of the medication could be filled at [facility] #[id number]  Please call.

## 2020-06-08 MED ORDER — VALPROIC ACID 250 MG/5ML PO SOLN: 500.0000 mg | Freq: Three times a day (TID) | ORAL | 3 refills | Status: DC

## 2020-06-08 NOTE — Telephone Encounter (Signed)
Called patient's wife and she said she had run out and pharmacy told her she had no refills.  A refill had been sent in on 05/10/20 with 3 refills.  Called pharmacy and they said they didn't have any refills on file.  Resent prescription, Corrected for the 30 ml x 30 day = 900 mls versus the 600 mls that was originally sent in.  Patient denied further questions, verbalized understanding and expressed appreciation for the phone call.

## 2020-06-08 NOTE — Addendum Note (Signed)
Addended by: Rhae Lerner R on: 06/08/2020 08:39 AM   Modules accepted: Orders

## 2020-06-09 ENCOUNTER — Other Ambulatory Visit: Payer: 59

## 2020-06-10 ENCOUNTER — Encounter: Payer: Self-pay | Admitting: Neurology

## 2020-06-10 ENCOUNTER — Ambulatory Visit: Payer: 59 | Admitting: Pulmonary Disease

## 2020-06-16 ENCOUNTER — Other Ambulatory Visit (INDEPENDENT_AMBULATORY_CARE_PROVIDER_SITE_OTHER): Payer: 59 | Admitting: Neurology

## 2020-06-16 DIAGNOSIS — R4020 Unspecified coma: Secondary | ICD-10-CM

## 2020-06-17 ENCOUNTER — Other Ambulatory Visit: Payer: Self-pay | Admitting: Internal Medicine

## 2020-06-17 ENCOUNTER — Ambulatory Visit (INDEPENDENT_AMBULATORY_CARE_PROVIDER_SITE_OTHER): Payer: 59 | Admitting: Pulmonary Disease

## 2020-06-17 ENCOUNTER — Other Ambulatory Visit: Payer: Self-pay

## 2020-06-17 ENCOUNTER — Other Ambulatory Visit: Payer: Self-pay | Admitting: Neurology

## 2020-06-17 ENCOUNTER — Encounter: Payer: Self-pay | Admitting: Pulmonary Disease

## 2020-06-17 VITALS — BP 108/68 | HR 62 | Ht 72.0 in | Wt 115.0 lb

## 2020-06-17 DIAGNOSIS — R911 Solitary pulmonary nodule: Secondary | ICD-10-CM

## 2020-06-17 DIAGNOSIS — C321 Malignant neoplasm of supraglottis: Secondary | ICD-10-CM | POA: Diagnosis not present

## 2020-06-17 DIAGNOSIS — R4020 Unspecified coma: Secondary | ICD-10-CM

## 2020-06-17 NOTE — Progress Notes (Signed)
Synopsis: Referred in June 2022 for abnormal CT chest, history of head and neck cancer, referred by Dr. Isidore Moos, PCP: By [name], MD  Subjective:   PATIENT ID: [name] GENDER: male DOB: [birth year], MRN: [id number]  Chief Complaint  Patient presents with   Consult    61 year old gentleman, history of stroke history of coronary disease, head and neck cancer.  PEG tube placement managed by Dr. Gertie Fey.  Last seen in the office by Dr. Isidore Moos from radiation oncology on 06/02/2020.  Diagnosed with a supraglottic head and neck cancer, stage IVa, cT2, cN2, cM0.  Patient underwent CT imaging in May 2022 which showed enlargement of a right apical and left lower lobe pulmonary nodule suspicious for metastasis.  Patient was referred here by Dr. Isidore Moos to consider tissue biopsy about next steps.  We talked about various options on the phone at the beginning of June to consider biopsy of these lesions.  Patient is agreeable to this plan.  We did reviewed images today in the office  CT scan of the chest completed on 05/17/2020 revealed interval enlargement of the right apical and left lower lobe pulmonary nodule.  The right apical pulmonary nodule measures 1.1 cm and it was 9 mm.  The left lower lobe nodule is now 6 mm in size was 4 mm.  OV 06/17/2020: Has no significant respiratory complaints today.  We reviewed images today in the office.   Past Medical History:  Diagnosis Date   Anemia    Anxiety    Arthritis    Cancer of larynx (HCC)    Carotid artery stenosis    Chronic lower back pain    Coronary artery disease    a. Diag cath 10/2012 for CP/abnormal nuc -> PCI s/p LAD atherectomy/DES placement 11/05/12.   Depression    Dilated aortic root (HCC)    GERD (gastroesophageal reflux disease)    History of hiatal hernia    Hypercholesteremia    Hypertension    Pneumonia    S/P percutaneous endoscopic gastrostomy (PEG) tube placement ([place])    Stroke ([place]) 07/31/2018   Pons      Family History  Problem Relation Age of Onset   Hypertension Mother    Heart disease Father    Hypertension Father    Heart disease Sister    Hypertension Maternal Grandmother    Hypertension Maternal Grandfather    Hypertension Paternal Grandmother    Hypertension Paternal Grandfather    Pancreatic cancer Maternal Uncle    Breast cancer Maternal Aunt    Breast cancer Maternal Aunt    Diabetes Paternal Uncle    Diabetes Paternal Aunt      Past Surgical History:  Procedure Laterality Date   BIOPSY  11/24/2019   Procedure: BIOPSY;  Surgeon: Mansouraty, Telford Nab., MD;  Location: [place];  Service: Gastroenterology;;   BIOPSY  03/29/2020   Procedure: BIOPSY;  Surgeon: Irving Copas., MD;  Location: [facility];  Service: Gastroenterology;;   CARDIAC CATHETERIZATION  10/29/2012   COLONOSCOPY WITH PROPOFOL N/A 11/24/2019   Procedure: COLONOSCOPY WITH PROPOFOL;  Surgeon: Irving Copas., MD;  Location: [place];  Service: Gastroenterology;  Laterality: N/A;   CORONARY ANGIOPLASTY WITH STENT PLACEMENT  11/05/2012   DIRECT LARYNGOSCOPY N/A 01/02/2019   Procedure: MICRO DIRECT LARYNGOSCOPY BIOPSY OF LARYNGEAL MASS;  Surgeon: [name], MD;  Location: [place];  Service: ENT;  Laterality: N/A;   ESOPHAGOGASTRODUODENOSCOPY (EGD) WITH PROPOFOL N/A 08/11/2019   Procedure: ATTEMPTED ESOPHAGOGASTRODUODENOSCOPY (EGD) WITH  PROPOFOL (UNABLE TO PERFORM PERCUTANEOUS ENDOSCOPIC GASTROSTOMY TUBE REPLACEMENT);  Surgeon: [name], MD;  Location: [facility];  Service: Gastroenterology;  Laterality: N/A;   ESOPHAGOGASTRODUODENOSCOPY (EGD) WITH PROPOFOL N/A 11/24/2019   Procedure: ESOPHAGOGASTRODUODENOSCOPY (EGD) WITH PROPOFOL;  Surgeon: Rush Landmark Telford Nab., MD;  Location: [place];  Service: Gastroenterology;  Laterality: N/A;   ESOPHAGOGASTRODUODENOSCOPY (EGD) WITH PROPOFOL N/A 01/26/2020   Procedure: ESOPHAGOGASTRODUODENOSCOPY (EGD) WITH PROPOFOL;  Surgeon: Rush Landmark  Telford Nab., MD;  Location: [place];  Service: Gastroenterology;  Laterality: N/A;   ESOPHAGOGASTRODUODENOSCOPY (EGD) WITH PROPOFOL N/A 03/29/2020   Procedure: ESOPHAGOGASTRODUODENOSCOPY (EGD) WITH PROPOFOL;  Surgeon: Rush Landmark Telford Nab., MD;  Location: [facility];  Service: Gastroenterology;  Laterality: N/A;  NEEDS FLOURO   HEMORRHOID SURGERY  ~ 2010   INSERTION OF MESH N/A 06/02/2015   Procedure: INSERTION OF MESH;  Surgeon: [name], MD;  Location: [facility];  Service: General;  Laterality: N/A;   IR ANGIO INTRA EXTRACRAN SEL COM CAROTID INNOMINATE BILAT MOD SED  08/02/2018   IR ANGIO VERTEBRAL SEL VERTEBRAL BILAT MOD SED  08/02/2018   IR REPLACE G-TUBE SIMPLE WO FLUORO  08/12/2019   IR [place] GASTRO/COLONIC TUBE PERCUT W/FLUORO  08/13/2019   LAPAROSCOPIC INSERTION GASTROSTOMY TUBE N/A 02/24/2019   Procedure: LAPAROSCOPIC INSERTION GASTROSTOMY TUBE;  Surgeon: [name], MD;  Location: [place];  Service: General;  Laterality: N/A;   LEFT HEART CATHETERIZATION WITH CORONARY ANGIOGRAM N/A 10/30/2012   Procedure: LEFT HEART CATHETERIZATION WITH CORONARY ANGIOGRAM;  Surgeon: [name], MD;  Location: [facility];  Service: Cardiovascular;  Laterality: N/A;   MULTIPLE EXTRACTIONS WITH ALVEOLOPLASTY N/A 02/18/2019   Procedure: Extraction of tooth #'s 5-7, 10,11,14,20-29, 31 and 32 with alveoloplasty and maxiillary left buccal exostoses reductions;  Surgeon: [name], [name];  Location: [place];  Service: Oral Surgery;  Laterality: N/A;   PEG PLACEMENT N/A 01/26/2020   Procedure: PERCUTANEOUS ENDOSCOPIC GASTROSTOMY (PEG) REPLACEMENT;  Surgeon: Irving Copas., MD;  Location: [place];  Service: Gastroenterology;  Laterality: N/A;   PERCUTANEOUS CORONARY ROTOBLATOR INTERVENTION (PCI-R) N/A 11/05/2012   Procedure: PERCUTANEOUS CORONARY ROTOBLATOR INTERVENTION (PCI-R);  Surgeon: [name], MD;  Location: [facility];  Service: Cardiovascular;  Laterality: N/A;   SAVORY  DILATION N/A 11/24/2019   Procedure: SAVORY DILATION;  Surgeon: Rush Landmark Telford Nab., MD;  Location: [place];  Service: Gastroenterology;  Laterality: N/A;   SAVORY DILATION N/A 01/26/2020   Procedure: SAVORY DILATION;  Surgeon: Rush Landmark Telford Nab., MD;  Location: [place];  Service: Gastroenterology;  Laterality: N/A;   TRACHEOSTOMY TUBE PLACEMENT N/A 02/18/2019   Procedure: Tracheostomy;  Surgeon: [name], MD;  Location: [place];  Service: ENT;  Laterality: N/A;   UMBILICAL HERNIA REPAIR N/A 06/02/2015   Procedure: UMBILICAL HERNIORRHAPHY WITH MESH;  Surgeon: [name], MD;  Location: [facility];  Service: General;  Laterality: N/A;    Social History   Socioeconomic History   Marital status: Married    Spouse name: [name]   Number of children: 0   Years of education: Not on file   Highest education level: Not on file  Occupational History   Occupation: Disabled    Comment: previously worked in Nurse, learning disability as an Clinical biochemist  Tobacco Use   Smoking status: Former    Packs/day: 3.00    Years: 32.00    Pack years: 96.00    Types: Cigarettes    Start date: 08/29/1977    Quit date: 11/29/2006    Years since quitting: 70.5  Smokeless tobacco: Former    Types: Chew    Quit date: 2003   Tobacco comments:    11/05/2012 "chewed tobacco when I play ball; haven't chewed since age 89"  Vaping Use   Vaping Use: Never used  Substance and Sexual Activity   Alcohol use: Yes    Alcohol/week: 12.0 standard drinks    Types: 12 Cans of beer per week    Comment: per wife [name] 6-7/week now as of 02/24/19   Drug use: Not Currently    Types: Marijuana    Comment: Last use was on - denies on 04/08/19   Sexual activity: Not on file  Other Topics Concern   Not on file  Social History Narrative   Lives with wife at home   Right Handed   Drinks 5-6 cups caffeine daily   Social Determinants of Health   Financial Resource Strain: Not on file  Food Insecurity: No Food  Insecurity   Worried About Charity fundraiser in the Last Year: Never true   Arboriculturist in the Last Year: Never true  Transportation Needs: Not on file  Physical Activity: Not on file  Stress: Not on file  Social Connections: Not on file  Intimate Partner Violence: Not on file     Allergies  Allergen Reactions   Cymbalta [Duloxetine Hcl]    Duloxetine Other (See Comments)    Caused depression/ patient is taking   Prednisone Other (See Comments)    Chest pain     Outpatient Medications Prior to Visit  Medication Sig Dispense Refill   acetaminophen (TYLENOL) 325 MG tablet Take 1.5 tablets (487.5 mg total) by mouth every 6 (six) hours as needed for mild pain, fever or headache (or Fever >/= 101).     aspirin EC 81 MG tablet Take 1 tablet (81 mg total) by mouth daily with breakfast. 30 tablet 4   atorvastatin (LIPITOR) 40 MG tablet Take 80 mg by mouth at bedtime.     diazepam (VALIUM) 10 MG tablet Take 10 mg by mouth 3 (three) times daily as needed.     dronabinol (MARINOL) 5 MG capsule Take 1 capsule (5 mg total) by mouth 2 (two) times daily before a meal. (Patient not taking: Reported on 06/03/2020) 60 capsule 2   DULoxetine (CYMBALTA) 60 MG capsule Take 60 mg by mouth daily.     escitalopram (LEXAPRO) 10 MG tablet Take 1 tablet (10 mg total) by mouth daily. 30 tablet 1   HYDROcodone-acetaminophen (NORCO) 10-325 MG tablet Take 1 tablet by mouth every 4 (four) hours as needed. 50 tablet 0   lactulose (CHRONULAC) 10 GM/15ML solution Take 30 mLs by mouth 4 (four) times daily as needed. (Patient not taking: Reported on 06/03/2020)     LINZESS 290 MCG CAPS capsule Take 290 mcg by mouth daily. (Patient not taking: Reported on 06/03/2020)     metoprolol tartrate (LOPRESSOR) 25 MG tablet Take 12.5 mg by mouth 2 (two) times daily.     mirtazapine (REMERON) 7.5 MG tablet Take 1 tablet (7.5 mg total) by mouth at bedtime. 30 tablet 2   nitroGLYCERIN (NITROSTAT) 0.4 MG SL tablet Place 1 tablet (0.4  mg total) under the tongue every 5 (five) minutes as needed for chest pain (CP or SOB). (Patient not taking: Reported on 06/03/2020) 25 tablet 3   omeprazole (PRILOSEC) 40 MG capsule Take 1 capsule by mouth 2 (two) times daily.     potassium chloride SA (KLOR-CON) 20 MEQ tablet  Take 20 mEq by mouth daily.     prochlorperazine (COMPAZINE) 10 MG tablet TAKE 1 TABLET(10 MG) BY MOUTH EVERY 6 HOURS AS NEEDED FOR NAUSEA (Patient taking differently: Take 10 mg by mouth every 6 (six) hours as needed for nausea.) 60 tablet 2   valproic acid (DEPAKENE) 250 MG/5ML solution Take 10 mLs (500 mg total) by mouth 3 (three) times daily. 900 mL 3   No facility-administered medications prior to visit.    Review of Systems  Constitutional:  Positive for weight loss. Negative for chills, fever and malaise/fatigue.  HENT:  Negative for hearing loss, sore throat and tinnitus.   Eyes:  Negative for blurred vision and double vision.  Respiratory:  Positive for cough and shortness of breath. Negative for hemoptysis, sputum production, wheezing and stridor.   Cardiovascular:  Negative for chest pain, palpitations, orthopnea, leg swelling and PND.  Gastrointestinal:  Positive for nausea. Negative for abdominal pain, constipation, diarrhea, heartburn and vomiting.  Genitourinary:  Negative for dysuria, hematuria and urgency.  Musculoskeletal:  Negative for joint pain and myalgias.  Skin:  Negative for itching and rash.  Neurological:  Negative for dizziness, tingling, weakness and headaches.  Endo/Heme/Allergies:  Negative for environmental allergies. Does not bruise/bleed easily.  Psychiatric/Behavioral:  Negative for depression. The patient is not nervous/anxious and does not have insomnia.   All other systems reviewed and are negative.   Objective:  Physical Exam Vitals reviewed.  Constitutional:      General: He is not in acute distress.    Appearance: He is well-developed.  HENT:     Head: Normocephalic and  atraumatic.     Mouth/Throat:     Pharynx: No oropharyngeal exudate.  Eyes:     Conjunctiva/sclera: Conjunctivae normal.     Pupils: Pupils are equal, round, and reactive to light.  Neck:     Vascular: No JVD.     Trachea: No tracheal deviation.     Comments: Loss of supraclavicular fat Cardiovascular:     Rate and Rhythm: Normal rate and regular rhythm.     Heart sounds: S1 normal and S2 normal.     Comments: Distant heart tones Pulmonary:     Effort: No tachypnea or accessory muscle usage.     Breath sounds: No stridor. Decreased breath sounds (throughout all lung fields) and wheezing present. No rhonchi or rales.  Abdominal:     General: Bowel sounds are normal. There is no distension.     Palpations: Abdomen is soft.     Tenderness: There is no abdominal tenderness.  Musculoskeletal:        General: Deformity (muscle wasting ) present.  Skin:    General: Skin is warm and dry.     Capillary Refill: Capillary refill takes less than 2 seconds.     Findings: No rash.  Neurological:     Mental Status: He is alert and oriented to person, place, and time.  Psychiatric:        Behavior: Behavior normal.     Vitals:   06/17/20 1528  BP: 108/68  Pulse: 62  SpO2: 92%  Weight: 115 lb (52.2 kg)  Height: 6' (1.829 m)   92% on RA BMI Readings from Last 3 Encounters:  06/17/20 15.60 kg/m  05/29/20 15.19 kg/m  05/26/20 16.00 kg/m   Wt Readings from Last 3 Encounters:  06/17/20 115 lb (52.2 kg)  05/29/20 112 lb (50.8 kg)  05/26/20 118 lb (53.5 kg)     CBC  Component Value Date/Time   WBC 6.6 05/29/2020 1508   RBC 3.00 (L) 05/29/2020 1508   HGB 9.8 (L) 05/29/2020 1508   HCT 31.1 (L) 05/29/2020 1508   HCT 25.0 (L) 04/02/2019 0437   PLT 133 (L) 05/29/2020 1508   MCV 103.7 (H) 05/29/2020 1508   MCH 32.7 05/29/2020 1508   MCHC 31.5 05/29/2020 1508   RDW 15.5 05/29/2020 1508   LYMPHSABS 0.9 05/29/2020 1508   MONOABS 0.7 05/29/2020 1508   EOSABS 0.1 05/29/2020  1508   BASOSABS 0.0 05/29/2020 1508      Chest Imaging:  05/17/2020: CT chest, enlarging right upper lobe and left lower lobe pulmonary nodule in the setting of a known history of head neck cancer concerning for metastasis. Imaging reviewed with patient today in the office. The patient's images have been independently reviewed by me.    Pulmonary Functions Testing Results: No flowsheet data found.  FeNO:   Pathology:   Echocardiogram:   Heart Catheterization:     Assessment & Plan:     ICD-10-CM   1. Right upper lobe pulmonary nodule  R91.1 CT Super D Chest Wo Contrast    Ambulatory referral to Pulmonology    NM PET Image Initial (PI) Skull Base To Thigh    Procedural/ Surgical Case Request: VIDEO BRONCHOSCOPY WITH ENDOBRONCHIAL NAVIGATION    2. Left lower lobe pulmonary nodule  R91.1     3. Malignant neoplasm of supraglottis (HCC)  C32.1       Discussion:  This is a 60 year old gentleman with supraglottic malignancy, head neck cancer received radiation treatment as well as chemotherapy.  Overall doing well at this time.  Recent CT imaging shows enlarging nodule within the chest concerning for either metastasis of his previous head neck cancer or new lung primary.  On previous imaging the nodule is much smaller.  Due to his significant smoking history we could be dealing with a primary lung.  Plan: We discussed the size of the lesion as well as the techniques required to reach such a small lesion. Due to it being medial location and new equipment coming within a few weeks I think we should consider planning for a robotic assisted navigational bronchoscopy. Patient is agreeable to this plan. We have scheduled patient on July 25 for robotic assisted navigational bronchoscopy. Repeat CT scan will need to be completed for planning purposes prior to this. We discussed the risk benefits and alternatives of proceeding. Risk include bleeding and pneumothorax. Orders  placed Would like to do this case first at 730 AM     Current Outpatient Medications:    acetaminophen (TYLENOL) 325 MG tablet, Take 1.5 tablets (487.5 mg total) by mouth every 6 (six) hours as needed for mild pain, fever or headache (or Fever >/= 101)., Disp: , Rfl:    aspirin EC 81 MG tablet, Take 1 tablet (81 mg total) by mouth daily with breakfast., Disp: 30 tablet, Rfl: 4   atorvastatin (LIPITOR) 40 MG tablet, Take 80 mg by mouth at bedtime., Disp: , Rfl:    diazepam (VALIUM) 10 MG tablet, Take 10 mg by mouth 3 (three) times daily as needed., Disp: , Rfl:    dronabinol (MARINOL) 5 MG capsule, Take 1 capsule (5 mg total) by mouth 2 (two) times daily before a meal. (Patient not taking: Reported on 06/03/2020), Disp: 60 capsule, Rfl: 2   DULoxetine (CYMBALTA) 60 MG capsule, Take 60 mg by mouth daily., Disp: , Rfl:    escitalopram (LEXAPRO) 10 MG  tablet, Take 1 tablet (10 mg total) by mouth daily., Disp: 30 tablet, Rfl: 1   HYDROcodone-acetaminophen (NORCO) 10-325 MG tablet, Take 1 tablet by mouth every 4 (four) hours as needed., Disp: 50 tablet, Rfl: 0   lactulose (CHRONULAC) 10 GM/15ML solution, Take 30 mLs by mouth 4 (four) times daily as needed. (Patient not taking: Reported on 06/03/2020), Disp: , Rfl:    LINZESS 290 MCG CAPS capsule, Take 290 mcg by mouth daily. (Patient not taking: Reported on 06/03/2020), Disp: , Rfl:    metoprolol tartrate (LOPRESSOR) 25 MG tablet, Take 12.5 mg by mouth 2 (two) times daily., Disp: , Rfl:    mirtazapine (REMERON) 7.5 MG tablet, Take 1 tablet (7.5 mg total) by mouth at bedtime., Disp: 30 tablet, Rfl: 2   nitroGLYCERIN (NITROSTAT) 0.4 MG SL tablet, Place 1 tablet (0.4 mg total) under the tongue every 5 (five) minutes as needed for chest pain (CP or SOB). (Patient not taking: Reported on 06/03/2020), Disp: 25 tablet, Rfl: 3   omeprazole (PRILOSEC) 40 MG capsule, Take 1 capsule by mouth 2 (two) times daily., Disp: , Rfl:    potassium chloride SA (KLOR-CON) 20 MEQ  tablet, Take 20 mEq by mouth daily., Disp: , Rfl:    prochlorperazine (COMPAZINE) 10 MG tablet, TAKE 1 TABLET(10 MG) BY MOUTH EVERY 6 HOURS AS NEEDED FOR NAUSEA (Patient taking differently: Take 10 mg by mouth every 6 (six) hours as needed for nausea.), Disp: 60 tablet, Rfl: 2   valproic acid (DEPAKENE) 250 MG/5ML solution, Take 10 mLs (500 mg total) by mouth 3 (three) times daily., Disp: 900 mL, Rfl: 3  I spent 62 minutes dedicated to the care of this patient on the date of this encounter to include pre-visit review of records, face-to-face time with the patient discussing conditions above, post visit ordering of testing, clinical documentation with the electronic health record, making appropriate referrals as documented, and communicating necessary findings to members of the patients care team.   Garner Nash, DO [facility] 06/17/2020 5:37 PM

## 2020-06-18 ENCOUNTER — Encounter: Payer: Self-pay | Admitting: Pulmonary Disease

## 2020-06-18 ENCOUNTER — Telehealth: Payer: Self-pay | Admitting: Pulmonary Disease

## 2020-06-18 NOTE — Telephone Encounter (Signed)
I have scheduled pt for 7/25 at 7:30 at [facility].  CT will be on 7/22 at 2:00 at [facility] CT & pt will go for covid test after.  PET scheduled for 7/1 at 3:00.  I spoke to pt's wife and gave her all appt info.

## 2020-06-19 NOTE — Progress Notes (Signed)
Kindly inform the patient that EEG study shows mild slowing of brainwave activity this is seen in a variety of conditions but no definite seizure activity was noted.

## 2020-06-22 ENCOUNTER — Telehealth: Payer: Self-pay | Admitting: Emergency Medicine

## 2020-06-22 NOTE — Telephone Encounter (Signed)
Called and spoke to patient's wife, [name], Regarding Dr. Clydene Fake review and findings regarding EEG Study.  Also confirmed patient's follow up with Janett Billow on 07/06/20 @ 1345, with arrival time of 1330.  Patient denied further questions, verbalized understanding and expressed appreciation for the phone call.

## 2020-07-02 ENCOUNTER — Ambulatory Visit (HOSPITAL_COMMUNITY)
Admission: RE | Admit: 2020-07-02 | Discharge: 2020-07-02 | Disposition: A | Discharge status: Home / Self Care | Payer: 59 | Source: Ambulatory Visit | Attending: Pulmonary Disease | Admitting: Pulmonary Disease

## 2020-07-02 ENCOUNTER — Other Ambulatory Visit: Payer: Self-pay

## 2020-07-02 ENCOUNTER — Ambulatory Visit (HOSPITAL_COMMUNITY): Admission: RE | Admit: 2020-07-02 | Payer: 59 | Source: Ambulatory Visit

## 2020-07-02 DIAGNOSIS — R911 Solitary pulmonary nodule: Secondary | ICD-10-CM | POA: Insufficient documentation

## 2020-07-02 LAB — GLUCOSE, CAPILLARY: Glucose-Capillary: 84 mg/dL (ref 70–99)

## 2020-07-02 MED ORDER — FLUDEOXYGLUCOSE F - 18 (FDG) INJECTION
5.7900 | Freq: Once | INTRAVENOUS | Status: AC
  Administered 2020-07-02: 5.79 via INTRAVENOUS

## 2020-07-06 ENCOUNTER — Encounter (HOSPITAL_COMMUNITY): Payer: Self-pay | Admitting: Hematology

## 2020-07-06 ENCOUNTER — Encounter: Payer: Self-pay | Admitting: Adult Health

## 2020-07-06 ENCOUNTER — Ambulatory Visit: Payer: 59 | Admitting: Adult Health

## 2020-07-06 VITALS — BP 91/61 | HR 54 | Ht 72.0 in | Wt 119.0 lb

## 2020-07-06 DIAGNOSIS — Z8673 Personal history of transient ischemic attack (TIA), and cerebral infarction without residual deficits: Secondary | ICD-10-CM

## 2020-07-06 DIAGNOSIS — R4020 Unspecified coma: Secondary | ICD-10-CM

## 2020-07-06 NOTE — Progress Notes (Signed)
[facility] [address] [place]. [place] [address] [phone]       OFFICE FOLLOW UP NOTE  [name] Date of Birth:  05-13-1960 Medical Record Number:  [id number]   Referring MD:  Irwin Brakeman  Reason for Referral:  Syncope versus seizure   Chief Complaint  Patient presents with   Follow-up    Rm 3 with spouse debbie Pt is well and stable       HPI:   Today, 07/21/2020, [name] returns for 59-month follow-up accompanied by his wife, [name], after prior consult visit with Dr. [name] for evaluation of loss of consciousness episodes possibly in setting syncope vs seizures.  MR brain did not showed stable appearance compared to prior imaging in 07/2018.  EEG showed mild slowing but no seizure activity.  He was started on valproic acid for possible seizures which he has remained on and has not had any additional loss of consciousness episodes.  He also reports resolution of diarrhea and improvement of appetite.  Stable from stroke standpoint without new stroke/TIA symptoms. Compliant on aspirin and atorvastatin without associated side effects. Blood pressure today 91/61. Ambulates without assistive device indoors normally but occasionally will use a rolling walker. Use of w/c long distance.   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  He plans on undergoing bronchoscopy currently scheduled 7/25 for further diagnosis.  He routinely follows with oncology and pulmonology.    History provided for reference purposes only Consult visit 05/06/2020 Dr. [name]: [name] is a 60 year old Caucasian male is referred today for evaluation for episodes of brief loss of consciousness.  History is obtained from the patient and his wife and daughter eyewitness as well as review of electronic medical records and personally reviewed pertinent imaging films  in PACS.  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  He has had multiple recent hospitalizations mostly related to dehydration and diarrhea and several ER visits.  The patient and wife however report that he has had multiple episodes of brief loss of consciousness.  Episodes are stereotypical with the patient's turning his neck upwards with eyes rolling back the patient been briefly unresponsive.  He usually complains of eyes being fuzzy and needing to sit down.  At times he can hold on and make it other times he just falls down and needs to be caught otherwise he will hit himself.  Episodes of brief and regains consciousness quickly.  Patient however does not remember the episode slated for a while though is not confused or disoriented.  There have been no witnessed tonic-clonic activity, tongue bite or incontinence.  This episode started in 2020 settle down after this for a few years but in the last few months episodes seem to have occurred again and now occur at variable frequency from several a day to once every few days.  Patient underwent a CT angiogram of the brain and neck on one of the recent ER admissions on 04/30/2020 which showed 60% stenosis of the right internal carotid artery at the bulb and 60% stenosis of left ICA at the skull base as well as moderate 50% bilateral carotid siphon stenosis.  Both vertebral arteries were occluded beyond the origin of the PICA.  These findings were not significantly worse compared with previous  CT angiogram from 03/04/2019.   He has remote history of bilateral pontine strokes in July 2020 when MRI showed R > L pontine infarcts.  MRA showed occlusion bilateral VA's.  CTA head/neck showed bilateral V4 stenosis with occlusion distal VA and BA distally, right ICA 70% and left ICA 65% narrowing, distal left ICA 60%  at cervical petrous junction, and severe ICA atherosclerosis, left CCA 40 to 50% stenosis, right VA 50% stenosis, bilateral V2 60 to 70% stenosis and left subclavian 65% stenosis.  Underwent cerebral angiogram by Dr. 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  No further interventions recommended at that time.  He also has history ofHeavy EtOH use and substance use with cessation counseling provided.  Other stroke risk factors include former tobacco use, family history of stroke and CAD s/p DES 2014.    Patient has not been evaluated for seizures with recent EEG or brain imaging studies.  He denies prior history of seizures.  He has an appointment with a gastroenterologist in a few weeks and with his cancer doctor as well to discuss more treatment for his what appears to be recurrent cancer.    ROS:   14 system review of systems is positive for those listed in HPI and other systems negative  PMH:  Past Medical History:  Diagnosis Date   Anemia    Anxiety    Arthritis    Cancer of larynx (HCC)    Carotid artery stenosis    Chronic lower back pain    Coronary artery disease    a. Diag cath 10/2012 for CP/abnormal nuc -> PCI s/p LAD atherectomy/DES placement 11/05/12.   Depression    Dilated aortic root (HCC)    GERD (gastroesophageal reflux disease)    History of hiatal hernia    Hypercholesteremia    Hypertension    Pneumonia    S/P percutaneous endoscopic gastrostomy (PEG) tube placement [facility])    Stroke ([place]) 07/31/2018   Pons    Social History:  Social History   Socioeconomic History   Marital status: Married    Spouse name: [name]   Number of children: 0   Years of education: Not on file   Highest education level: Not on file  Occupational History   Occupation: Disabled    Comment: previously worked in Nurse, learning disability as an Clinical biochemist  Tobacco Use   Smoking status: Former    Packs/day: 3.00     Years: 32.00    Pack years: 96.00    Types: Cigarettes    Start date: 08/29/1977    Quit date: 11/29/2006    Years since quitting: 13.6   Smokeless tobacco: Former    Types: Chew    Quit date: 2003   Tobacco comments:    11/05/2012 "chewed tobacco when I play ball; haven't chewed since age 28"  Vaping Use   Vaping Use: Never used  Substance and Sexual Activity   Alcohol use: Yes    Alcohol/week: 12.0 standard drinks    Types: 12 Cans of beer per week    Comment: per wife [name] 6-7/week now as of 02/24/19   Drug use: Not Currently    Types: Marijuana    Comment: Last use was on - denies on 04/08/19   Sexual activity: Not on file  Other Topics Concern   Not on file  Social History Narrative   Lives with wife at home   Right Handed   Drinks  5-6 cups caffeine daily   Social Determinants of Health   Financial Resource Strain: Not on file  Food Insecurity: No Food Insecurity   Worried About Charity fundraiser in the Last Year: Never true   Arboriculturist in the Last Year: Never true  Transportation Needs: Not on file  Physical Activity: Not on file  Stress: Not on file  Social Connections: Not on file  Intimate Partner Violence: Not on file    Medications:   Current Outpatient Medications on File Prior to Visit  Medication Sig Dispense Refill   acetaminophen (TYLENOL) 325 MG tablet Take 1.5 tablets (487.5 mg total) by mouth every 6 (six) hours as needed for mild pain, fever or headache (or Fever >/= 101).     aspirin EC 81 MG tablet Take 1 tablet (81 mg total) by mouth daily with breakfast. 30 tablet 4   atorvastatin (LIPITOR) 40 MG tablet Take 80 mg by mouth at bedtime.     diazepam (VALIUM) 10 MG tablet Take 10 mg by mouth 3 (three) times daily as needed.     dronabinol (MARINOL) 5 MG capsule Take 1 capsule (5 mg total) by mouth 2 (two) times daily before a meal. (Patient not taking: Reported on 06/03/2020) 60 capsule 2   DULoxetine (CYMBALTA) 60 MG capsule Take 60 mg by  mouth daily.     escitalopram (LEXAPRO) 10 MG tablet Take 1 tablet (10 mg total) by mouth daily. 30 tablet 1   HYDROcodone-acetaminophen (NORCO) 10-325 MG tablet Take 1 tablet by mouth every 4 (four) hours as needed. 50 tablet 0   lactulose (CHRONULAC) 10 GM/15ML solution Take 30 mLs by mouth 4 (four) times daily as needed. (Patient not taking: Reported on 06/03/2020)     LINZESS 290 MCG CAPS capsule Take 290 mcg by mouth daily. (Patient not taking: Reported on 06/03/2020)     metoprolol tartrate (LOPRESSOR) 25 MG tablet Take 12.5 mg by mouth 2 (two) times daily.     mirtazapine (REMERON) 7.5 MG tablet Take 1 tablet (7.5 mg total) by mouth at bedtime. 30 tablet 2   nitroGLYCERIN (NITROSTAT) 0.4 MG SL tablet Place 1 tablet (0.4 mg total) under the tongue every 5 (five) minutes as needed for chest pain (CP or SOB). (Patient not taking: Reported on 06/03/2020) 25 tablet 3   omeprazole (PRILOSEC) 40 MG capsule Take 1 capsule by mouth 2 (two) times daily.     potassium chloride SA (KLOR-CON) 20 MEQ tablet Take 20 mEq by mouth daily.     prochlorperazine (COMPAZINE) 10 MG tablet TAKE 1 TABLET(10 MG) BY MOUTH EVERY 6 HOURS AS NEEDED FOR NAUSEA (Patient taking differently: Take 10 mg by mouth every 6 (six) hours as needed for nausea.) 60 tablet 2   valproic acid (DEPAKENE) 250 MG/5ML solution Take 10 mLs (500 mg total) by mouth 3 (three) times daily. 900 mL 3   No current facility-administered medications on file prior to visit.    Allergies:   Allergies  Allergen Reactions   Cymbalta [Duloxetine Hcl]    Duloxetine Other (See Comments)    Caused depression/ patient is taking   Prednisone Other (See Comments)    Chest pain    Physical Exam Today's Vitals   07/06/20 1331  BP: 91/61  Pulse: (!) 54  Weight: 119 lb (54 kg)  Height: 6' (1.829 m)   Body mass index is 16.14 kg/m.   General: Frail malnourished looking middle-aged Caucasian male seated, in no evident distress  Head: head normocephalic  and atraumatic.   Neck: supple with no carotid or supraclavicular bruits Cardiovascular: regular rate and rhythm, no murmurs Musculoskeletal: no deformity Skin:  no rash/petichiae Vascular:  Normal pulses all extremities  Neurologic Exam Mental Status: Awake and fully alert. Oriented to place and time. Recent and remote memory i poor. Attention span, concentration and fund of knowledge appropriate. Mood and affect appropriate.  Cranial Nerves: Fundoscopic exam reveals sharp disc margins. Pupils equal, briskly reactive to light. Extraocular movements full without nystagmus. Visual fields full to confrontation. Hearing intact. Facial sensation intact. Face, tongue, palate moves normally and symmetrically.  Motor: Normal bulk and tone. Normal strength in all tested extremity muscles except mild bilateral ankle dorsiflexor weakness. Sensory.: intact to touch , pinprick , position and vibratory sensation.  Coordination: Rapid alternating movements normal in all extremities. Finger-to-nose and heel-to-shin performed accurately bilaterally. Gait and Station: Arises from chair with some difficulty. Stance is slightly stooped. Gait uses a walker.  Slightly broad-based with mild bilateral foot drop.   Reflexes: 1+ and symmetric. Toes downgoing.      ASSESSMENT:[name] is a 60 y.o. year old male with hx of R>L pontine infarcts on 07/31/2018 secondary to large vessel disease source. Vascular risk factors include uncontrolled HTN, HLD, CAD, intra-and extracranial stenosis heavy EtOH use and substance abuse. dx'd malignant neoplasm of supraglottis 01/2019 s/p chemo and XRT with recent evidence of lung nodules increasing in size currently awaiting bronchoscopy.  Recent multiple episodes of brief loss of consciousness with memory loss possibly syncopal events in the setting of dehydration from chronic diarrhea and significant occlusive extracranial intracranial disease though complex partial seizures are  possible though less likely     PLAN:  Loss of consciousness -likely in setting of syncope with dehydration as above although unable to rule out possible seizure activity -No additional events since starting valproic acid -EEG 06/2020 mild slowing but no evidence of seizure activity -MR brain 05/2020 no significant change compared to prior MRI in 07/2018 -continue valproic acid 500 mg 3 times daily (liquid form) for possible seizure prophylaxis especially as he is at seizure risk with history of prior strokes.  Valproic acid level 55 05/2020 - will plan on repeating at follow-up visit  2. Hx of stroke -Continue aspirin and atorvastatin for secondary stroke prevention -Continue follow-up with PCP for aggressive stroke risk factor management including HTN with BP goal<130/90 and HLD with LDL goal<70   Follow-up in 3 months or call earlier if needed   CC:  [facility] provider: Dr. Jomarie Longs, MD   I spent 34 minutes of face-to-face and non-face-to-face time with patient and wife.  This included previsit chart review, lab review, study review, electronic health record documentation, and patient and wife education and discussion regarding episodes of loss of consciousness and possible etiologies, review of EEG and MRI and ongoing use of valproic acid, prior history of stroke with secondary stroke prevention education importance of aggressive stroke risk factor management and answered all other questions to patient and wife satisfaction  Frann Rider, AGNP-BC  [facility] [address] [place], [place] [address]  Phone [phone] Fax [phone] Note: This document was prepared with digital dictation and possible smart phrase technology. Any transcriptional errors that result from this process are unintentional.

## 2020-07-06 NOTE — Patient Instructions (Addendum)
Your Plan:  Continue valproic acid 500mg  three times daily for seizure prevention   Continue aspirin and atorvastatin for secondary stroke prevention     Follow up in 3 months or call earlier if needed     Thank you for coming to see [place] at [facility]. I hope we have been able to provide you high quality care today.  You may receive a patient satisfaction survey over the next few weeks. We would appreciate your feedback and comments so that we may continue to improve ourselves and the health of our patients.

## 2020-07-09 ENCOUNTER — Telehealth: Payer: Self-pay | Admitting: Internal Medicine

## 2020-07-09 NOTE — Telephone Encounter (Signed)
LMTCB  Noted faxed to number.   Nothing further needed at this time.

## 2020-07-13 NOTE — Progress Notes (Signed)
I agree with the above plan 

## 2020-07-20 ENCOUNTER — Other Ambulatory Visit: Payer: 59

## 2020-07-23 ENCOUNTER — Encounter (HOSPITAL_COMMUNITY): Payer: Self-pay | Admitting: Pulmonary Disease

## 2020-07-23 ENCOUNTER — Ambulatory Visit (INDEPENDENT_AMBULATORY_CARE_PROVIDER_SITE_OTHER)
Admission: RE | Admit: 2020-07-23 | Discharge: 2020-07-23 | Disposition: A | Discharge status: Home / Self Care | Payer: 59 | Source: Ambulatory Visit | Attending: Pulmonary Disease | Admitting: Pulmonary Disease

## 2020-07-23 ENCOUNTER — Other Ambulatory Visit: Payer: Self-pay

## 2020-07-23 ENCOUNTER — Other Ambulatory Visit (HOSPITAL_COMMUNITY)
Admission: RE | Admit: 2020-07-23 | Discharge: 2020-07-23 | Disposition: A | Discharge status: Home / Self Care | Payer: 59 | Source: Ambulatory Visit | Attending: Pulmonary Disease | Admitting: Pulmonary Disease

## 2020-07-23 DIAGNOSIS — R911 Solitary pulmonary nodule: Secondary | ICD-10-CM

## 2020-07-23 DIAGNOSIS — Z01812 Encounter for preprocedural laboratory examination: Secondary | ICD-10-CM | POA: Insufficient documentation

## 2020-07-23 DIAGNOSIS — Z20822 Contact with and (suspected) exposure to covid-19: Secondary | ICD-10-CM | POA: Insufficient documentation

## 2020-07-23 LAB — SARS CORONAVIRUS 2 (TAT 6-24 HRS): SARS Coronavirus 2: NEGATIVE

## 2020-07-23 NOTE — Progress Notes (Signed)
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  Initially had a trach placed 02/18/2019 but has been subsequently decannulated.  He continues to be monitored by Dr. Delton Coombes.  Recent CT soft tissue neck 05/17/2020 showed posttreatment changes with decreased laryngeal edema and no evidence of recurrence of disease in the neck.  CT chest 05/17/2020 showed lung nodules gradually increasing in size.  Follows with neurology for history of CVA (R >L pontine infarct on 07/31/2018 secondary to large vessel disease source).  As well as recent history of LOC secondary to syncope versus seizures.  Last seen 07/06/2020.  Per note, "1. Loss of consciousness -likely in setting of syncope with dehydration as above although unable to rule out possible seizure activity -No additional events since starting valproic acid -EEG 06/2020 mild slowing but no evidence of seizure activity -MR brain 05/2020 no significant change compared to prior MRI in 07/2018 -continue valproic acid 500 mg 3 times daily (liquid form) for possible seizure prophylaxis especially as he is at seizure risk with history of prior strokes.  Valproic acid level 55 05/2020 - will plan on repeating at follow-up visit."  [place] cardiology for history of CAD s/p rotational atherectomy and DES to proximal LAD 2014, HTN, HLD, dilated aortic root.  Last seen by cardiology 09/15/2019 and noted to have no recent anginal symptoms.  Recommended continue with recommendation, no changes made.  History of dysphagia with PEG tube placement.  Per CT soft tissue neck report 05/17/2020, "FINDINGS: Pharynx and larynx: There is mild residual laryngeal edema, decreased from the prior study. No recurrent mass is identified within limitations of noncontrast technique. The airway is widely patent."  Patient will need day of surgery labs and evaluation.  EKG 04/30/2020: Sinus  bradycardia.  Rate 58. Short PR interval. Low voltage, extremity and precordial leads  CT chest 05/17/2020: IMPRESSION: 1. Enlargement of right apical and left lower lobe pulmonary nodules, again suspicious for metastatic disease. 2. Combination of findings, including fluid in the lower lobe endobronchial tree with basilar predominant areas of ground-glass, less so airspace disease, highly suspicious for aspiration. A lingular nodule could represent new metastatic disease are also be infectious/inflammatory. 3. No thoracic adenopathy. 4. Aortic atherosclerosis (ICD10-I70.0), coronary artery atherosclerosis and emphysema (ICD10-J43.9). 5. Pulmonary artery enlargement suggests pulmonary arterial hypertension. 6. The left renal abnormality on the prior exam is not imaged today.  Carotid duplex 09/02/2019: Summary:  Right Carotid: Velocities in the right ICA are consistent with a 1-39% stenosis, however more accurate velocities were obscured due to calcific plaque. Therefore, this stenosis is likely underestimated.  Left Carotid: Velocities in the left ICA are consistent with a 1-39% stenosis.  Vertebrals:  Bilateral vertebral arteries demonstrate antegrade flow.  Subclavians: Normal flow hemodynamics were seen in bilateral subclavian arteries.   CTA chest/aorta 10/20/2019: IMPRESSION: 1. Dilated aortic root with measurements as above. (Cardiovascular: Thoracic aortic diameter measurements as follows:4.7 cm at the sinuses of Valsalva, 2.9 cm at the sinotubular junction, 3.4 cm in the mid ascending aorta, 3.1 cm in the proximal aortic arch, 2.9 cm in the proximal descending thoracic aorta, and 2.4 cm in the descending aorta at the level of the hiatus. Mild aortic atherosclerosis. No dissection. Three-vessel coronary atherosclerosis. Normal heart size. No pericardial effusion.) 2. 6 mm right upper lobe lung nodule, increased in size from the prior PET-CT and with considerations including both  metastatic disease and infection/inflammation. 3. Resolved subpleural nodules laterally  in the right upper lobe with mildly increased ground-glass opacities anteroinferiorly in the right upper lobe, likely infectious/inflammatory. 4. Subcentimeter hyperenhancing liver lesions, potentially vascular or flash filling hemangiomas although metastases are also possible. A follow-up abdominal MRI is recommended in 3-6 months. 5. Aortic Atherosclerosis (ICD10-I70.0) and Emphysema (ICD10-J43.9).  TTE 10/24/2019:  1. Left ventricular ejection fraction, by estimation, is 60 to 65%. The  left ventricle has normal function. The left ventricle has no regional  wall motion abnormalities. There is mild left ventricular hypertrophy.  Left ventricular diastolic parameters  are consistent with Grade I diastolic dysfunction (impaired relaxation).   2. Right ventricular systolic function is normal. The right ventricular  size is normal.   3. The mitral valve is normal in structure. No evidence of mitral valve  regurgitation. No evidence of mitral stenosis.   4. The aortic valve is tricuspid. Aortic valve regurgitation is mild. No  aortic stenosis is present.   5. Aortic dilatation noted. There is moderate dilatation of the aortic  root, measuring 44 mm.   6. The inferior vena cava is normal in size with greater than 50%  respiratory variability, suggesting right atrial pressure of 3 mmHg.    Wynonia Musty [facility] Short Stay Center/Anesthesiology Phone [phone] 07/23/2020 1:48 PM

## 2020-07-23 NOTE — Progress Notes (Signed)
PCP - Dr. Gerarda Fraction Cardiologist - Dr. Bronson Ing  EKG - 04/30/20 Chest x-ray -  ECHO - 4/421 Cardiac Cath - 10/29/12 CPAP -   ERAS Protcol - n/a  COVID TEST- 07/23/20  Anesthesia review: yes  -------------  SDW INSTRUCTIONS:  Your procedure is scheduled on Monday 7/25. Please report to [facility] Main Entrance "A" at 0530 A.M., and check in at the Admitting office. Call this number if you have problems the morning of surgery: [phone]   Remember: Do not eat or drink after midnight the night before your surgery   Medications to take morning of surgery with a sip of water include: acetaminophen (TYLENOL) if needed DULoxetine (CYMBALTA) escitalopram (LEXAPRO)  famotidine (PEPCID) HYDROcodone-acetaminophen (NORCO) if needed  metoprolol tartrate (LOPRESSOR) nitroGLYCERIN (NITROSTAT) if needed  As of today, STOP taking any Aleve, Naproxen, Ibuprofen, Motrin, Advil, Goody's, BC's, all herbal medications, fish oil, and all vitamins.  Follow your surgeon's instructions on when to stop Aspirin.  If no instructions were given by your surgeon then you will need to call the office to get those instructions.      The Morning of Surgery Do not wear jewelry Do not wear lotions, powders, colognes, or deodorant Men may shave face and neck. Do not bring valuables to the hospital. [facility] is not responsible for any belongings or valuables.  If you are a smoker, DO NOT Smoke 24 hours prior to surgery  If you wear a CPAP at night please bring your mask the morning of surgery   Remember that you must have someone to transport you home after your surgery, and remain with you for 24 hours if you are discharged the same day.  Please bring cases for contacts, glasses, hearing aids, dentures or bridgework because it cannot be worn into surgery.   Patients discharged the day of surgery will not be allowed to drive home.   Please shower the NIGHT BEFORE/MORNING OF SURGERY (use  antibacterial soap like DIAL soap if possible). Wear comfortable clothes the morning of surgery. Oral Hygiene is also important to reduce your risk of infection.  Remember - BRUSH YOUR TEETH THE MORNING OF SURGERY WITH YOUR REGULAR TOOTHPASTE  Patient denies shortness of breath, fever, cough and chest pain.

## 2020-07-23 NOTE — Anesthesia Preprocedure Evaluation (Addendum)
Anesthesia Evaluation  Patient identified by MRN, date of birth, ID band Patient awake    Reviewed: Allergy & Precautions, H&P , NPO status , Patient's Chart, lab work & pertinent test results  Airway Mallampati: II   Neck ROM: full    Dental   Pulmonary former smoker,    breath sounds clear to auscultation       Cardiovascular hypertension, + CAD and + Cardiac Stents   Rhythm:regular Rate:Normal     Neuro/Psych PSYCHIATRIC DISORDERS Anxiety Depression CVA    GI/Hepatic hiatal hernia, GERD  ,  Endo/Other    Renal/GU      Musculoskeletal  (+) Arthritis ,   Abdominal   Peds  Hematology   Anesthesia Other Findings   Reproductive/Obstetrics                            Anesthesia Physical Anesthesia Plan  ASA: 3  Anesthesia Plan: General   Post-op Pain Management:    Induction: Intravenous  PONV Risk Score and Plan: 2 and Ondansetron, Dexamethasone, Midazolam and Treatment may vary due to age or medical condition  Airway Management Planned: Oral ETT  Additional Equipment:   Intra-op Plan:   Post-operative Plan: Extubation in OR  Informed Consent: I have reviewed the patients History and Physical, chart, labs and discussed the procedure including the risks, benefits and alternatives for the proposed anesthesia with the patient or authorized representative who has indicated his/her understanding and acceptance.       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  Initially had a trach placed 02/18/2019 but has been subsequently decannulated.  He continues to be monitored by Dr. Delton Coombes.  Recent CT soft tissue neck 05/17/2020 showed posttreatment changes with decreased laryngeal edema and no evidence of  recurrence of disease in the neck.  CT chest 05/17/2020 showed lung nodules gradually increasing in size.  Follows with neurology for history of CVA (R >L pontine infarct on 07/31/2018 secondary to large vessel disease source).  As well as recent history of LOC secondary to syncope versus seizures.  Last seen 07/06/2020.  Per note, "1. Loss of consciousness -likely in setting of syncope with dehydration as above although unable to rule out possible seizure activity -No additional events since starting valproic acid -EEG 06/2020 mild slowing but no evidence of seizure activity -MR brain 05/2020 no significant change compared to prior MRI in 07/2018 -continue valproic acid 500 mg 3 times daily (liquid form) for possible seizure prophylaxis especially as he is at seizure risk with history of prior strokes.  Valproic acid level 55 05/2020 - will plan on repeating at follow-up visit."  [place] [facility] for history of CAD s/p rotational atherectomy and DES to proximal LAD 2014, HTN, HLD, dilated aortic root.  Last seen by cardiology 09/15/2019 and noted to have no recent anginal symptoms.  Recommended continue with recommendation, no changes made.  History of dysphagia with PEG tube placement.  Per CT soft tissue neck report 05/17/2020, "FINDINGS: Pharynx and larynx: There is mild residual laryngeal edema, decreased from the prior study. No recurrent mass is identified within limitations of noncontrast technique. The airway is widely patent."  Patient will need day of surgery labs and evaluation.  EKG 04/30/2020: Sinus bradycardia.  Rate 58. Short PR interval. Low voltage, extremity and precordial leads  CT chest 05/17/2020: IMPRESSION: 1. Enlargement of right apical and left lower lobe pulmonary nodules, again suspicious for metastatic disease. 2. Combination of findings, including fluid in the lower lobe endobronchial tree with basilar predominant areas of ground-glass, less so airspace disease, highly  suspicious for aspiration. A lingular nodule could represent new metastatic disease are also be infectious/inflammatory. 3. No thoracic adenopathy. 4. Aortic atherosclerosis (ICD10-I70.0), coronary artery atherosclerosis and emphysema (ICD10-J43.9). 5. Pulmonary artery enlargement suggests pulmonary arterial hypertension. 6. The left renal abnormality on the prior exam is not imaged today.  Carotid duplex 09/02/2019: Summary:  Right Carotid: Velocities in the right ICA are consistent with a 1-39% stenosis, however more accurate velocities were obscured due to calcific plaque. Therefore, this stenosis is likely underestimated.  Left Carotid: Velocities in the left ICA are consistent with a 1-39% stenosis.  Vertebrals: Bilateral vertebral arteries demonstrate antegrade flow.  Subclavians: Normal flow hemodynamics were seen in bilateral subclavian arteries.   CTA chest/aorta 10/20/2019: IMPRESSION: 1. Dilated aortic root with measurements as above. (Cardiovascular: Thoracic aortic diameter measurements as follows:4.7 cm at the sinuses of Valsalva, 2.9 cm at the sinotubular junction, 3.4 cm in the mid ascending aorta, 3.1 cm in the proximal aortic arch, 2.9 cm in the proximal descending thoracic aorta, and 2.4 cm in the descending aorta at the level of the hiatus. Mild aortic atherosclerosis. No dissection. Three-vessel coronary atherosclerosis. Normal heart size. No pericardial effusion.) 2. 6 mm right upper lobe lung nodule, increased in size from the prior PET-CT and with considerations including both metastatic disease and infection/inflammation. 3. Resolved subpleural nodules laterally in the right upper lobe with mildly increased ground-glass opacities anteroinferiorly in the right upper lobe, likely infectious/inflammatory. 4. Subcentimeter hyperenhancing liver lesions, potentially vascular or flash filling hemangiomas although metastases are also possible. A follow-up abdominal MRI is  recommended in 3-6 months. 5. Aortic Atherosclerosis (ICD10-I70.0) and Emphysema (ICD10-J43.9).  TTE 10/24/2019: 1. Left ventricular ejection fraction, by estimation, is 60 to 65%. The  left ventricle has normal function. The left ventricle has no regional  wall motion abnormalities. There is mild left ventricular hypertrophy.  Left ventricular diastolic parameters  are consistent with Grade I diastolic dysfunction (impaired relaxation).  2. Right ventricular systolic function is normal. The right ventricular  size is normal.  3. The mitral valve is normal in structure. No evidence of mitral valve  regurgitation. No evidence of mitral stenosis.  4. The aortic valve is tricuspid. Aortic valve regurgitation is mild. No  aortic stenosis is present.  5. Aortic dilatation noted. There is moderate dilatation of the aortic  root, measuring 44 mm.  6. The inferior vena cava is normal in size with greater than 50%  respiratory variability, suggesting right atrial pressure of 3 mmHg.  )       Anesthesia Quick Evaluation

## 2020-07-26 ENCOUNTER — Ambulatory Visit (HOSPITAL_COMMUNITY)
Admission: RE | Admit: 2020-07-26 | Discharge: 2020-07-26 | Disposition: A | Discharge status: Home / Self Care | Payer: 59 | Attending: Pulmonary Disease | Admitting: Pulmonary Disease

## 2020-07-26 ENCOUNTER — Encounter (HOSPITAL_COMMUNITY)
Admission: RE | Disposition: A | Discharge status: Home / Self Care | Payer: Self-pay | Source: Home / Self Care | Attending: Pulmonary Disease

## 2020-07-26 ENCOUNTER — Other Ambulatory Visit: Payer: Self-pay

## 2020-07-26 ENCOUNTER — Ambulatory Visit (HOSPITAL_COMMUNITY): Payer: 59

## 2020-07-26 ENCOUNTER — Ambulatory Visit (HOSPITAL_COMMUNITY): Payer: 59 | Admitting: Physician Assistant

## 2020-07-26 ENCOUNTER — Encounter (HOSPITAL_COMMUNITY): Payer: Self-pay | Admitting: Pulmonary Disease

## 2020-07-26 DIAGNOSIS — E7800 Pure hypercholesterolemia, unspecified: Secondary | ICD-10-CM | POA: Insufficient documentation

## 2020-07-26 DIAGNOSIS — Z888 Allergy status to other drugs, medicaments and biological substances status: Secondary | ICD-10-CM | POA: Insufficient documentation

## 2020-07-26 DIAGNOSIS — Z8673 Personal history of transient ischemic attack (TIA), and cerebral infarction without residual deficits: Secondary | ICD-10-CM | POA: Insufficient documentation

## 2020-07-26 DIAGNOSIS — Z419 Encounter for procedure for purposes other than remedying health state, unspecified: Secondary | ICD-10-CM

## 2020-07-26 DIAGNOSIS — Z7982 Long term (current) use of aspirin: Secondary | ICD-10-CM | POA: Insufficient documentation

## 2020-07-26 DIAGNOSIS — J439 Emphysema, unspecified: Secondary | ICD-10-CM | POA: Diagnosis not present

## 2020-07-26 DIAGNOSIS — Z8719 Personal history of other diseases of the digestive system: Secondary | ICD-10-CM | POA: Diagnosis not present

## 2020-07-26 DIAGNOSIS — Z8521 Personal history of malignant neoplasm of larynx: Secondary | ICD-10-CM | POA: Insufficient documentation

## 2020-07-26 DIAGNOSIS — Z87891 Personal history of nicotine dependence: Secondary | ICD-10-CM | POA: Insufficient documentation

## 2020-07-26 DIAGNOSIS — Z9889 Other specified postprocedural states: Secondary | ICD-10-CM

## 2020-07-26 DIAGNOSIS — R918 Other nonspecific abnormal finding of lung field: Secondary | ICD-10-CM | POA: Diagnosis present

## 2020-07-26 DIAGNOSIS — I2510 Atherosclerotic heart disease of native coronary artery without angina pectoris: Secondary | ICD-10-CM | POA: Diagnosis not present

## 2020-07-26 DIAGNOSIS — I10 Essential (primary) hypertension: Secondary | ICD-10-CM | POA: Diagnosis not present

## 2020-07-26 DIAGNOSIS — Z955 Presence of coronary angioplasty implant and graft: Secondary | ICD-10-CM | POA: Insufficient documentation

## 2020-07-26 DIAGNOSIS — Z79899 Other long term (current) drug therapy: Secondary | ICD-10-CM | POA: Diagnosis not present

## 2020-07-26 DIAGNOSIS — R911 Solitary pulmonary nodule: Secondary | ICD-10-CM | POA: Diagnosis not present

## 2020-07-26 DIAGNOSIS — C321 Malignant neoplasm of supraglottis: Secondary | ICD-10-CM | POA: Diagnosis not present

## 2020-07-26 HISTORY — PX: VIDEO BRONCHOSCOPY WITH RADIAL ENDOBRONCHIAL ULTRASOUND: SHX6849

## 2020-07-26 HISTORY — PX: FIDUCIAL MARKER PLACEMENT: SHX6858

## 2020-07-26 HISTORY — PX: BRONCHIAL NEEDLE ASPIRATION BIOPSY: SHX5106

## 2020-07-26 HISTORY — PX: VIDEO BRONCHOSCOPY WITH ENDOBRONCHIAL NAVIGATION: SHX6175

## 2020-07-26 HISTORY — PX: BRONCHIAL BIOPSY: SHX5109

## 2020-07-26 HISTORY — PX: BRONCHIAL BRUSHINGS: SHX5108

## 2020-07-26 LAB — CBC
HCT: 36.6 % — ABNORMAL LOW (ref 39.0–52.0)
Hemoglobin: 10.8 g/dL — ABNORMAL LOW (ref 13.0–17.0)
MCH: 31.4 pg (ref 26.0–34.0)
MCHC: 29.5 g/dL — ABNORMAL LOW (ref 30.0–36.0)
MCV: 106.4 fL — ABNORMAL HIGH (ref 80.0–100.0)
Platelets: 162 10*3/uL (ref 150–400)
RBC: 3.44 MIL/uL — ABNORMAL LOW (ref 4.22–5.81)
RDW: 13.3 % (ref 11.5–15.5)
WBC: 4.8 10*3/uL (ref 4.0–10.5)
nRBC: 0 % (ref 0.0–0.2)

## 2020-07-26 LAB — POCT I-STAT, CHEM 8
BUN: 15 mg/dL (ref 6–20)
Calcium, Ion: 1.31 mmol/L (ref 1.15–1.40)
Chloride: 91 mmol/L — ABNORMAL LOW (ref 98–111)
Creatinine, Ser: 0.7 mg/dL (ref 0.61–1.24)
Glucose, Bld: 80 mg/dL (ref 70–99)
HCT: 36 % — ABNORMAL LOW (ref 39.0–52.0)
Hemoglobin: 12.2 g/dL — ABNORMAL LOW (ref 13.0–17.0)
Potassium: 4.3 mmol/L (ref 3.5–5.1)
Sodium: 132 mmol/L — ABNORMAL LOW (ref 135–145)
TCO2: 35 mmol/L — ABNORMAL HIGH (ref 22–32)

## 2020-07-26 SURGERY — VIDEO BRONCHOSCOPY WITH ENDOBRONCHIAL NAVIGATION
Anesthesia: General | Laterality: Bilateral

## 2020-07-26 MED ORDER — OXYCODONE HCL 5 MG PO TABS: 5.0000 mg | ORAL_TABLET | Freq: Once | ORAL | Status: DC | PRN

## 2020-07-26 MED ORDER — FENTANYL CITRATE (PF) 100 MCG/2ML IJ SOLN: 25.0000 ug | INTRAMUSCULAR | Status: DC | PRN

## 2020-07-26 MED ORDER — DEXAMETHASONE SODIUM PHOSPHATE 10 MG/ML IJ SOLN
INTRAMUSCULAR | Status: DC | PRN
  Administered 2020-07-26: 5 mg via INTRAVENOUS

## 2020-07-26 MED ORDER — SUGAMMADEX SODIUM 200 MG/2ML IV SOLN
INTRAVENOUS | Status: DC | PRN
  Administered 2020-07-26: 200 mg via INTRAVENOUS

## 2020-07-26 MED ORDER — LACTATED RINGERS IV SOLN: INTRAVENOUS | Status: DC

## 2020-07-26 MED ORDER — CHLORHEXIDINE GLUCONATE 0.12 % MT SOLN
OROMUCOSAL | Status: AC
  Administered 2020-07-26: 15 mL
  Filled 2020-07-26: qty 15

## 2020-07-26 MED ORDER — SUCCINYLCHOLINE CHLORIDE 200 MG/10ML IV SOSY
PREFILLED_SYRINGE | INTRAVENOUS | Status: DC | PRN
  Administered 2020-07-26: 120 mg via INTRAVENOUS

## 2020-07-26 MED ORDER — FENTANYL CITRATE (PF) 100 MCG/2ML IJ SOLN
INTRAMUSCULAR | Status: DC | PRN
  Administered 2020-07-26: 100 ug via INTRAVENOUS

## 2020-07-26 MED ORDER — ONDANSETRON HCL 4 MG/2ML IJ SOLN
INTRAMUSCULAR | Status: DC | PRN
  Administered 2020-07-26: 4 mg via INTRAVENOUS

## 2020-07-26 MED ORDER — OXYCODONE HCL 5 MG/5ML PO SOLN: 5.0000 mg | Freq: Once | ORAL | Status: DC | PRN

## 2020-07-26 MED ORDER — LIDOCAINE 2% (20 MG/ML) 5 ML SYRINGE
INTRAMUSCULAR | Status: DC | PRN
Start: 2020-07-26 — End: 2020-07-26
  Administered 2020-07-26: 40 mg via INTRAVENOUS

## 2020-07-26 MED ORDER — ROCURONIUM BROMIDE 10 MG/ML (PF) SYRINGE
PREFILLED_SYRINGE | INTRAVENOUS | Status: DC | PRN
  Administered 2020-07-26: 50 mg via INTRAVENOUS

## 2020-07-26 MED ORDER — EPHEDRINE SULFATE-NACL 50-0.9 MG/10ML-% IV SOSY
PREFILLED_SYRINGE | INTRAVENOUS | Status: DC | PRN
  Administered 2020-07-26 (×5): 10 mg via INTRAVENOUS

## 2020-07-26 MED ORDER — ONDANSETRON HCL 4 MG/2ML IJ SOLN: 4.0000 mg | Freq: Four times a day (QID) | INTRAMUSCULAR | Status: DC | PRN

## 2020-07-26 MED ORDER — MIDAZOLAM HCL 2 MG/2ML IJ SOLN
INTRAMUSCULAR | Status: DC | PRN
  Administered 2020-07-26: 2 mg via INTRAVENOUS

## 2020-07-26 MED ORDER — PROPOFOL 10 MG/ML IV BOLUS
INTRAVENOUS | Status: DC | PRN
  Administered 2020-07-26: 100 mg via INTRAVENOUS

## 2020-07-26 SURGICAL SUPPLY — 46 items

## 2020-07-26 NOTE — Anesthesia Postprocedure Evaluation (Signed)
Anesthesia Post Note  Patient: [name]  Procedure(s) Performed: VIDEO BRONCHOSCOPY WITH ENDOBRONCHIAL NAVIGATION (Bilateral) BRONCHIAL NEEDLE ASPIRATION BIOPSIES BRONCHIAL BRUSHINGS FIDUCIAL MARKER PLACEMENT BRONCHIAL BIOPSIES VIDEO BRONCHOSCOPY WITH RADIAL ENDOBRONCHIAL ULTRASOUND     Patient location during evaluation: PACU Anesthesia Type: General Level of consciousness: awake and alert Pain management: pain level controlled Vital Signs Assessment: post-procedure vital signs reviewed and stable Respiratory status: spontaneous breathing, nonlabored ventilation, respiratory function stable and patient connected to nasal cannula oxygen Cardiovascular status: blood pressure returned to baseline and stable Postop Assessment: no apparent nausea or vomiting Anesthetic complications: no   No notable events documented.  Last Vitals:  Vitals:   07/26/20 1042 07/26/20 1057  BP: (!) 166/98 (!) 153/87  Pulse: 70 66  Resp: 17 13  Temp:  36.9 C  SpO2: 100% 92%    Last Pain:  Vitals:   07/26/20 1057  TempSrc:   PainSc: 0-No pain                 Maylyn Narvaiz S

## 2020-07-26 NOTE — Op Note (Addendum)
Video Bronchoscopy with Robotic Assisted Bronchoscopic Navigation Procedure Note  Date of Operation: 07/26/2020  Pre-op Diagnosis: Bilateral Lung Nodules  Post-op Diagnosis: Bilateral Lung Nodules   Surgeon: Garner Nash, DO   Assistants: Baltazar Apo, MD  Anesthesia: General endotracheal anesthesia  Operation: Flexible video fiberoptic bronchoscopy with electromagnetic navigation and biopsies.  Estimated Blood Loss: Minimal  Complications: None  Indications and History: [name] is a 60 y.o. male with history of head neck cancer, bilateral lung nodules.  The risks, benefits, complications, treatment options and expected outcomes were discussed with the patient.  The possibilities of pneumothorax, pneumonia, reaction to medication, pulmonary aspiration, perforation of a viscus, bleeding, failure to diagnose a condition and creating a complication requiring transfusion or operation were discussed with the patient who freely signed the consent.    Description of Procedure: The patient was seen in the Preoperative Area, was examined and was deemed appropriate to proceed.  The patient was taken to [facility], The endoscopy room 2, identified as [name] and the procedure verified as Flexible Video Fiberoptic Bronchoscopy.  A Time Out was held and the above information confirmed.   Prior to the date of the procedure a high-resolution CT scan of the chest was performed. Utilizing CT software a virtual tracheobronchial tree was generated to allow the creation of distinct navigation pathways to the patient's parenchymal abnormalities. After being taken to the operating room general anesthesia was initiated and the patient  was orally intubated. The video fiberoptic bronchoscope was introduced via the endotracheal tube and a general inspection was performed which showed normal right and left lung anatomy, no evidence of endobronchial lesion. Robotic catheter inserted into patient's endotracheal tube.    Target #1 left lower lobe: The distinct navigation pathways prepared prior to this procedure were then utilized to navigate to within 4 mm of patient's lesion(s) identified on CT scan. The robotic catheter was secured into place and the vision probe was withdrawn.  Lesion location was approximated using fluoroscopy and radial endobronchial ultrasound for peripheral targeting. Under fluoroscopic guidance transbronchial needle brushings, transbronchial Wang needle biopsies, and transbronchial forceps biopsies were performed to be sent for cytology and pathology.  Following tissue sampling a single fiducial was placed under fluoroscopic guidance.  Target #2 right upper lobe: The distinct navigation pathways prepared prior to this procedure were then utilized to navigate to within 62mm of patient's lesion(s) identified on CT scan. The robotic catheter was secured into place and the vision probe was withdrawn.  Lesion location was approximated using fluoroscopy and radial endobronchial ultrasound for peripheral targeting. Under fluoroscopic guidance transbronchial needle brushings, transbronchial Wang needle biopsies, and transbronchial forceps biopsies were performed to be sent for cytology and pathology.  Following tissue sampling a single fiducial was placed under fluoroscopic guidance.  At the end of the procedure a general airway inspection was performed and there was no evidence of active bleeding. The bronchoscope was removed.  The patient tolerated the procedure well. There was no significant blood loss and there were no obvious complications. A post-procedural chest x-ray is pending.  Samples Target #1 left lower lobe: 1. Transbronchial needle brushings from left lower lobe 2. Transbronchial Wang needle biopsies from left lower lobe 3. Transbronchial forceps biopsies from left lower lobe  Samples Target #2 right upper lobe: 1. Transbronchial needle brushings from right upper lobe 2.  Transbronchial Wang needle biopsies from right upper lobe 3. Transbronchial forceps biopsies from right upper lobe  Plans:  The patient will be discharged from  the PACU to home when recovered from anesthesia and after chest x-ray is reviewed. We will review the cytology, pathology and microbiology results with the patient when they become available. Outpatient followup will be with Garner Nash, DO.   Garner Nash, DO [facility] 07/26/2020 10:34 AM

## 2020-07-26 NOTE — Transfer of Care (Signed)
Immediate Anesthesia Transfer of Care Note  Patient: [name]  Procedure(s) Performed: VIDEO BRONCHOSCOPY WITH ENDOBRONCHIAL NAVIGATION (Bilateral) BRONCHIAL NEEDLE ASPIRATION BIOPSIES BRONCHIAL BRUSHINGS FIDUCIAL MARKER PLACEMENT BRONCHIAL BIOPSIES VIDEO BRONCHOSCOPY WITH RADIAL ENDOBRONCHIAL ULTRASOUND  Patient Location: PACU  Anesthesia Type:General  Level of Consciousness: awake and patient cooperative  Airway & Oxygen Therapy: Patient Spontanous Breathing and Patient connected to nasal cannula oxygen  Post-op Assessment: Report given to RN, Post -op Vital signs reviewed and stable and Patient moving all extremities  Post vital signs: Reviewed and stable  Last Vitals:  Vitals Value Taken Time  BP 159/87 07/26/20 1027  Temp 36.4 C 07/26/20 1027  Pulse 73 07/26/20 1029  Resp 14 07/26/20 1029  SpO2 100 % 07/26/20 1029  Vitals shown include unvalidated device data.  Last Pain:  Vitals:   07/26/20 0602  TempSrc: Oral         Complications: No notable events documented.

## 2020-07-26 NOTE — Discharge Instructions (Addendum)
Flexible Bronchoscopy, Care After This sheet gives you information about how to care for yourself after your test. Your doctor may also give you more specific instructions. If you have problems or questions, contact your doctor. Follow these instructions at home: Eating and drinking Do not eat or drink anything (not even water) for 2 hours after your test, or until your numbing medicine (local anesthetic) wears off. When your numbness is gone and your cough and gag reflexes have come back, you may: Eat only soft foods. Slowly drink liquids. The day after the test, go back to your normal diet. Driving Do not drive for 24 hours if you were given a medicine to help you relax (sedative). Do not drive or use heavy machinery while taking prescription pain medicine. General instructions  Take over-the-counter and prescription medicines only as told by your doctor. Return to your normal activities as told. Ask what activities are safe for you. Do not use any products that have nicotine or tobacco in them. This includes cigarettes and e-cigarettes. If you need help quitting, ask your doctor. Keep all follow-up visits as told by your doctor. This is important. It is very important if you had a tissue sample (biopsy) taken. Get help right away if: You have shortness of breath that gets worse. You get light-headed. You feel like you are going to pass out (faint). You have chest pain. You cough up: More than a little blood. More blood than before. Summary Do not eat or drink anything (not even water) for 2 hours after your test, or until your numbing medicine wears off. Do not use cigarettes. Do not use e-cigarettes. Get help right away if you have chest pain.  This information is not intended to replace advice given to you by your health care provider. Make sure you discuss any questions you have with your health care provider. Document Released: 10/16/2008 Document Revised: 12/01/2016 Document  Reviewed: 01/07/2016 Elsevier Patient Education  2020 [employer].

## 2020-07-26 NOTE — Interval H&P Note (Signed)
History and Physical Interval Note:  07/26/2020 6:21 AM  [name]  has presented today for surgery, with the diagnosis of lung nodule.  The various methods of treatment have been discussed with the patient and family. After consideration of risks, benefits and other options for treatment, the patient has consented to  Procedure(s) with comments: [place] (Bilateral) - ION as a surgical intervention.  The patient's history has been reviewed, patient examined, no change in status, stable for surgery.  I have reviewed the patient's chart and labs.  Questions were answered to the patient's satisfaction.     [place]

## 2020-07-26 NOTE — Anesthesia Procedure Notes (Signed)
Procedure Name: Intubation Date/Time: 07/26/2020 8:34 AM Performed by: Moshe Salisbury, CRNA Pre-anesthesia Checklist: Patient identified, Emergency Drugs available, Suction available and Patient being monitored Patient Re-evaluated:Patient Re-evaluated prior to induction Oxygen Delivery Method: Circle System Utilized Preoxygenation: Pre-oxygenation with 100% oxygen Induction Type: IV induction Ventilation: Mask ventilation without difficulty Laryngoscope Size: Mac and 4 Grade View: Grade I Tube type: Oral Tube size: 8.5 (ETT passed with mild resistance through cords) mm Number of attempts: 1 Airway Equipment and Method: Stylet Placement Confirmation: ETT inserted through vocal cords under direct vision, positive ETCO2 and breath sounds checked- equal and bilateral Secured at: 20 cm Tube secured with: Tape Dental Injury: Teeth and Oropharynx as per pre-operative assessment

## 2020-07-26 NOTE — H&P (Signed)
Synopsis: Referred in June 2022 for abnormal CT chest, history of head and neck cancer, referred by Dr. Isidore Moos, PCP: By [name], MD   Subjective:    PATIENT ID: [name] GENDER: male DOB: Jun 30, 1960, MRN: [id number]      Chief Complaint  Patient presents with   Consult      60 year old gentleman, history of stroke history of coronary disease, head and neck cancer.  PEG tube placement managed by Dr. Gertie Fey.  Last seen in the office by Dr. Isidore Moos from radiation oncology on 06/02/2020.  Diagnosed with a supraglottic head and neck cancer, stage IVa, cT2, cN2, cM0.  Patient underwent CT imaging in May 2022 which showed enlargement of a right apical and left lower lobe pulmonary nodule suspicious for metastasis.  Patient was referred here by Dr. Isidore Moos to consider tissue biopsy about next steps.  We talked about various options on the phone at the beginning of June to consider biopsy of these lesions.  Patient is agreeable to this plan.  We did reviewed images today in the office   CT scan of the chest completed on 05/17/2020 revealed interval enlargement of the right apical and left lower lobe pulmonary nodule.  The right apical pulmonary nodule measures 1.1 cm and it was 9 mm.  The left lower lobe nodule is now 6 mm in size was 4 mm.   OV 06/17/2020: Has no significant respiratory complaints today.  We reviewed images today in the office.         Past Medical History:  Diagnosis Date   Anemia     Anxiety     Arthritis     Cancer of larynx (HCC)     Carotid artery stenosis     Chronic lower back pain     Coronary artery disease      a. Diag cath 10/2012 for CP/abnormal nuc -> PCI s/p LAD atherectomy/DES placement 11/05/12.   Depression     Dilated aortic root (HCC)     GERD (gastroesophageal reflux disease)     History of hiatal hernia     Hypercholesteremia     Hypertension     Pneumonia     S/P percutaneous endoscopic gastrostomy (PEG) tube placement ([place])      Stroke ([place]) 07/31/2018    Pons           Family History  Problem Relation Age of Onset   Hypertension Mother     Heart disease Father     Hypertension Father     Heart disease Sister     Hypertension Maternal Grandmother     Hypertension Maternal Grandfather     Hypertension Paternal Grandmother     Hypertension Paternal Grandfather     Pancreatic cancer Maternal Uncle     Breast cancer Maternal Aunt     Breast cancer Maternal Aunt     Diabetes Paternal Uncle     Diabetes Paternal Aunt             Past Surgical History:  Procedure Laterality Date   BIOPSY   11/24/2019    Procedure: BIOPSY;  Surgeon: Mansouraty, Telford Nab., MD;  Location: [facility] ENDOSCOPY;  Service: Gastroenterology;;   BIOPSY   03/29/2020    Procedure: BIOPSY;  Surgeon: Irving Copas., MD;  Location: [facility] ENDOSCOPY;  Service: Gastroenterology;;   CARDIAC CATHETERIZATION   10/29/2012   COLONOSCOPY WITH PROPOFOL N/A 11/24/2019    Procedure: COLONOSCOPY WITH PROPOFOL;  Surgeon: Irving Copas.,  MD;  Location: [place];  Service: Gastroenterology;  Laterality: N/A;   CORONARY ANGIOPLASTY WITH STENT PLACEMENT   11/05/2012   DIRECT LARYNGOSCOPY N/A 01/02/2019    Procedure: MICRO DIRECT LARYNGOSCOPY BIOPSY OF LARYNGEAL MASS;  Surgeon: [name], MD;  Location: [place];  Service: ENT;  Laterality: N/A;   ESOPHAGOGASTRODUODENOSCOPY (EGD) WITH PROPOFOL N/A 08/11/2019    Procedure: ATTEMPTED ESOPHAGOGASTRODUODENOSCOPY (EGD) WITH PROPOFOL (UNABLE TO PERFORM PERCUTANEOUS ENDOSCOPIC GASTROSTOMY TUBE REPLACEMENT);  Surgeon: [name], MD;  Location: [facility];  Service: Gastroenterology;  Laterality: N/A;   ESOPHAGOGASTRODUODENOSCOPY (EGD) WITH PROPOFOL N/A 11/24/2019    Procedure: ESOPHAGOGASTRODUODENOSCOPY (EGD) WITH PROPOFOL;  Surgeon: Rush Landmark Telford Nab., MD;  Location: [place];  Service: Gastroenterology;  Laterality: N/A;   ESOPHAGOGASTRODUODENOSCOPY (EGD) WITH PROPOFOL N/A 01/26/2020    Procedure:  ESOPHAGOGASTRODUODENOSCOPY (EGD) WITH PROPOFOL;  Surgeon: Rush Landmark Telford Nab., MD;  Location: [place];  Service: Gastroenterology;  Laterality: N/A;   ESOPHAGOGASTRODUODENOSCOPY (EGD) WITH PROPOFOL N/A 03/29/2020    Procedure: ESOPHAGOGASTRODUODENOSCOPY (EGD) WITH PROPOFOL;  Surgeon: Rush Landmark Telford Nab., MD;  Location: [facility] ENDOSCOPY;  Service: Gastroenterology;  Laterality: N/A;  NEEDS FLOURO   HEMORRHOID SURGERY   ~ 2010   INSERTION OF MESH N/A 06/02/2015    Procedure: INSERTION OF MESH;  Surgeon: [name], MD;  Location: [facility];  Service: General;  Laterality: N/A;   IR ANGIO INTRA EXTRACRAN SEL COM CAROTID INNOMINATE BILAT MOD SED   08/02/2018   IR ANGIO VERTEBRAL SEL VERTEBRAL BILAT MOD SED   08/02/2018   IR REPLACE G-TUBE SIMPLE WO FLUORO   08/12/2019   IR [place]  GASTRO/COLONIC TUBE PERCUT W/FLUORO   08/13/2019   LAPAROSCOPIC INSERTION GASTROSTOMY TUBE N/A 02/24/2019    Procedure: LAPAROSCOPIC INSERTION GASTROSTOMY TUBE;  Surgeon: [name], MD;  Location: [place];  Service: General;  Laterality: N/A;   LEFT HEART CATHETERIZATION WITH CORONARY ANGIOGRAM N/A 10/30/2012    Procedure: LEFT HEART CATHETERIZATION WITH CORONARY ANGIOGRAM;  Surgeon: [name], MD;  Location: [place] CATH LAB;  Service: Cardiovascular;  Laterality: N/A;   MULTIPLE EXTRACTIONS WITH ALVEOLOPLASTY N/A 02/18/2019    Procedure: Extraction of tooth #'s 5-7, 10,11,14,20-29, 31 and 32 with alveoloplasty and maxiillary left buccal exostoses reductions;  Surgeon: [name], [name];  Location: [place];  Service: Oral Surgery;  Laterality: N/A;   PEG PLACEMENT N/A 01/26/2020    Procedure: PERCUTANEOUS ENDOSCOPIC GASTROSTOMY (PEG) REPLACEMENT;  Surgeon: Irving Copas., MD;  Location: [place];  Service: Gastroenterology;  Laterality: N/A;   PERCUTANEOUS CORONARY ROTOBLATOR INTERVENTION (PCI-R) N/A 11/05/2012    Procedure: PERCUTANEOUS CORONARY ROTOBLATOR INTERVENTION (PCI-R);  Surgeon: [name],  MD;  Location: [facility] CATH LAB;  Service: Cardiovascular;  Laterality: N/A;   SAVORY DILATION N/A 11/24/2019    Procedure: SAVORY DILATION;  Surgeon: Rush Landmark Telford Nab., MD;  Location: [place];  Service: Gastroenterology;  Laterality: N/A;   SAVORY DILATION N/A 01/26/2020    Procedure: SAVORY DILATION;  Surgeon: Rush Landmark Telford Nab., MD;  Location: [place];  Service: Gastroenterology;  Laterality: N/A;   TRACHEOSTOMY TUBE PLACEMENT N/A 02/18/2019    Procedure: Tracheostomy;  Surgeon: [name], MD;  Location: [place];  Service: ENT;  Laterality: N/A;   UMBILICAL HERNIA REPAIR N/A 06/02/2015    Procedure: UMBILICAL HERNIORRHAPHY WITH MESH;  Surgeon: [name], MD;  Location: [facility];  Service: General;  Laterality: N/A;      Social History         Socioeconomic History   Marital status: Married  Spouse name: [name]   Number of children: 0   Years of education: Not on file   Highest education level: Not on file  Occupational History   Occupation: Disabled      Comment: previously worked in Nurse, learning disability as an Clinical biochemist  Tobacco Use   Smoking status: Former      Packs/day: 3.00      Years: 32.00      Pack years: 96.00      Types: Cigarettes      Start date: 08/29/1977      Quit date: 11/29/2006      Years since quitting: 13.5   Smokeless tobacco: Former      Types: Chew      Quit date: 2003   Tobacco comments:      11/05/2012 "chewed tobacco when I play ball; haven't chewed since age 39"  Vaping Use   Vaping Use: Never used  Substance and Sexual Activity   Alcohol use: Yes      Alcohol/week: 12.0 standard drinks      Types: 12 Cans of beer per week      Comment: per wife [name] 6-7/week now as of 02/24/19   Drug use: Not Currently      Types: Marijuana      Comment: Last use was on - denies on 04/08/19   Sexual activity: Not on file  Other Topics Concern   Not on file  Social History Narrative    Lives with wife at home    Right Handed    Drinks  5-6 cups caffeine daily    Social Determinants of Health       Financial Resource Strain: Not on file  Food Insecurity: No Food Insecurity   Worried About Charity fundraiser in the Last Year: Never true   Arboriculturist in the Last Year: Never true  Transportation Needs: Not on file  Physical Activity: Not on file  Stress: Not on file  Social Connections: Not on file  Intimate Partner Violence: Not on file           Allergies  Allergen Reactions   Cymbalta [Duloxetine Hcl]     Duloxetine Other (See Comments)      Caused depression/ patient is taking   Prednisone Other (See Comments)      Chest pain            Outpatient Medications Prior to Visit  Medication Sig Dispense Refill   acetaminophen (TYLENOL) 325 MG tablet Take 1.5 tablets (487.5 mg total) by mouth every 6 (six) hours as needed for mild pain, fever or headache (or Fever >/= 101).       aspirin EC 81 MG tablet Take 1 tablet (81 mg total) by mouth daily with breakfast. 30 tablet 4   atorvastatin (LIPITOR) 40 MG tablet Take 80 mg by mouth at bedtime.       diazepam (VALIUM) 10 MG tablet Take 10 mg by mouth 3 (three) times daily as needed.       dronabinol (MARINOL) 5 MG capsule Take 1 capsule (5 mg total) by mouth 2 (two) times daily before a meal. (Patient not taking: Reported on 06/03/2020) 60 capsule 2   DULoxetine (CYMBALTA) 60 MG capsule Take 60 mg by mouth daily.       escitalopram (LEXAPRO) 10 MG tablet Take 1 tablet (10 mg total) by mouth daily. 30 tablet 1   HYDROcodone-acetaminophen (NORCO) 10-325 MG tablet Take 1 tablet by mouth  every 4 (four) hours as needed. 50 tablet 0   lactulose (CHRONULAC) 10 GM/15ML solution Take 30 mLs by mouth 4 (four) times daily as needed. (Patient not taking: Reported on 06/03/2020)       LINZESS 290 MCG CAPS capsule Take 290 mcg by mouth daily. (Patient not taking: Reported on 06/03/2020)       metoprolol tartrate (LOPRESSOR) 25 MG tablet Take 12.5 mg by mouth 2 (two) times daily.        mirtazapine (REMERON) 7.5 MG tablet Take 1 tablet (7.5 mg total) by mouth at bedtime. 30 tablet 2   nitroGLYCERIN (NITROSTAT) 0.4 MG SL tablet Place 1 tablet (0.4 mg total) under the tongue every 5 (five) minutes as needed for chest pain (CP or SOB). (Patient not taking: Reported on 06/03/2020) 25 tablet 3   omeprazole (PRILOSEC) 40 MG capsule Take 1 capsule by mouth 2 (two) times daily.       potassium chloride SA (KLOR-CON) 20 MEQ tablet Take 20 mEq by mouth daily.       prochlorperazine (COMPAZINE) 10 MG tablet TAKE 1 TABLET(10 MG) BY MOUTH EVERY 6 HOURS AS NEEDED FOR NAUSEA (Patient taking differently: Take 10 mg by mouth every 6 (six) hours as needed for nausea.) 60 tablet 2   valproic acid (DEPAKENE) 250 MG/5ML solution Take 10 mLs (500 mg total) by mouth 3 (three) times daily. 900 mL 3    No facility-administered medications prior to visit.     Review of Systems  Constitutional:  Negative for chills, fever, malaise/fatigue and weight loss.  HENT:  Negative for hearing loss, sore throat and tinnitus.   Eyes:  Negative for blurred vision and double vision.  Respiratory:  Negative for cough, hemoptysis, sputum production, shortness of breath, wheezing and stridor.   Cardiovascular:  Negative for chest pain, palpitations, orthopnea, leg swelling and PND.  Gastrointestinal:  Negative for abdominal pain, constipation, diarrhea, heartburn, nausea and vomiting.  Genitourinary:  Negative for dysuria, hematuria and urgency.  Musculoskeletal:  Negative for joint pain and myalgias.  Skin:  Negative for itching and rash.  Neurological:  Negative for dizziness, tingling, weakness and headaches.  Endo/Heme/Allergies:  Negative for environmental allergies. Does not bruise/bleed easily.  Psychiatric/Behavioral:  Negative for depression. The patient is not nervous/anxious and does not have insomnia.   All other systems reviewed and are negative.      Objective:   General appearance: 60 y.o.,  male, thin chronically ill appearing  Eyes: anicteric sclerae, moist conjunctivae HENT: NCAT; oropharynx Neck: Trachea midline; FROM, supple, lymphadenopathy, no JVD Lungs: CTAB, no crackles, no wheeze, with normal respiratory effort and no intercostal retractions CV: RRR, S1, S2, no MRGs  Abdomen: Soft, non-tender; non-distended, BS present  Extremities: No peripheral edema, radial and DP pulses present bilaterally  Skin: Normal temperature, turgor and texture; no rash Psych: Appropriate affect Neuro: Alert and oriented to person and place, no focal deficit       BP (!) 172/94   Pulse (!) 54   Temp 97.7 F (36.5 C) (Oral)   Resp 18   Ht 6' (1.829 m)   Wt 59 kg   SpO2 97%   BMI 17.63 kg/m        Wt Readings from Last 3 Encounters:  06/17/20 115 lb (52.2 kg)  05/29/20 112 lb (50.8 kg)  05/26/20 118 lb (53.5 kg)        CBC Labs (Brief)          Component Value Date/Time  WBC 6.6 05/29/2020 1508    RBC 3.00 (L) 05/29/2020 1508    HGB 9.8 (L) 05/29/2020 1508    HCT 31.1 (L) 05/29/2020 1508    HCT 25.0 (L) 04/02/2019 0437    PLT 133 (L) 05/29/2020 1508    MCV 103.7 (H) 05/29/2020 1508    MCH 32.7 05/29/2020 1508    MCHC 31.5 05/29/2020 1508    RDW 15.5 05/29/2020 1508    LYMPHSABS 0.9 05/29/2020 1508    MONOABS 0.7 05/29/2020 1508    EOSABS 0.1 05/29/2020 1508    BASOSABS 0.0 05/29/2020 1508            Chest Imaging:   05/17/2020: CT chest, enlarging right upper lobe and left lower lobe pulmonary nodule in the setting of a known history of head neck cancer concerning for metastasis. Imaging reviewed with patient today in the office. The patient's images have been independently reviewed by me.     Pulmonary Functions Testing Results: No flowsheet data found.   FeNO:    Pathology:    Echocardiogram:    Heart Catheterization:     Assessment & Plan:        ICD-10-CM    1. Right upper lobe pulmonary nodule R91.1 CT Super D Chest Wo Contrast       Ambulatory referral to Pulmonology      NM PET Image Initial (PI) Skull Base To Thigh      Procedural/ Surgical Case Request: VIDEO BRONCHOSCOPY WITH ENDOBRONCHIAL NAVIGATION     2. Left lower lobe pulmonary nodule R91.1       3. Malignant neoplasm of supraglottis (HCC) C32.1           Discussion:   This is a 60 year old gentleman with supraglottic malignancy, head neck cancer received radiation treatment as well as chemotherapy.  Overall doing well at this time.  Recent CT imaging shows enlarging nodule within the chest concerning for either metastasis of his previous head neck cancer or new lung primary.  On previous imaging the nodule is much smaller.  Due to his significant smoking history we could be dealing with a primary lung.  Plan: Today we discussed the risks, benefits and alternatives of robotic assisted navigational bronchoscopy.  We discussed the risk of bleeding and pneumothorax.  The patient is agreeable to proceed.     [facility] 07/26/2020 6:19 AM

## 2020-07-27 ENCOUNTER — Telehealth: Payer: Self-pay | Admitting: Radiation Oncology

## 2020-07-27 ENCOUNTER — Other Ambulatory Visit: Payer: Self-pay

## 2020-07-27 NOTE — Telephone Encounter (Signed)
Bronch performed on 7/25. Will close encounter.

## 2020-07-27 NOTE — Telephone Encounter (Signed)
Called patient to schedule an appointment with Dr. Isidore Moos. No answer, LVM for return call.

## 2020-07-28 ENCOUNTER — Other Ambulatory Visit: Payer: Self-pay

## 2020-07-28 DIAGNOSIS — C321 Malignant neoplasm of supraglottis: Secondary | ICD-10-CM

## 2020-07-29 ENCOUNTER — Other Ambulatory Visit (HOSPITAL_COMMUNITY)
Admission: RE | Admit: 2020-07-29 | Discharge: 2020-07-29 | Disposition: A | Discharge status: Home / Self Care | Payer: 59 | Source: Ambulatory Visit | Attending: Radiation Oncology | Admitting: Radiation Oncology

## 2020-07-29 ENCOUNTER — Telehealth: Payer: Self-pay | Admitting: *Deleted

## 2020-07-29 ENCOUNTER — Encounter (HOSPITAL_COMMUNITY): Payer: Self-pay | Admitting: Pulmonary Disease

## 2020-07-29 ENCOUNTER — Other Ambulatory Visit: Payer: Self-pay

## 2020-07-29 DIAGNOSIS — Z01812 Encounter for preprocedural laboratory examination: Secondary | ICD-10-CM | POA: Insufficient documentation

## 2020-07-29 DIAGNOSIS — Z20822 Contact with and (suspected) exposure to covid-19: Secondary | ICD-10-CM | POA: Insufficient documentation

## 2020-07-29 LAB — SARS CORONAVIRUS 2 (TAT 6-24 HRS): SARS Coronavirus 2: NEGATIVE

## 2020-07-29 LAB — CYTOLOGY - NON PAP

## 2020-07-29 NOTE — Progress Notes (Signed)
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  Patient was referred to pulmonologist by Dr. Isidore Moos to consider tissue biopsy about next steps  Biopsies revealed:  07/26/2020 FINAL MICROSCOPIC DIAGNOSIS:  A. LUNG, LLL, BRUSHING:  - Atypical cells present  B. LUNG, LLL, FINE NEEDLE ASPIRATION:  - Atypical cells present  COMMENT:  A. and B. The atypical cells have a different appearance than those in the RUL sample and are favored to be reactive.  C. LUNG, RUL, BRUSHING:  - Malignant cells present consistent with non-small cell carcinoma.  D. LUNG, RUL, FINE NEEDLE ASPIRATION:  - Malignant cells present consistent with non-small cell carcinoma.  COMMENT:  D. Immunohistochemistry is positive for cytokeratin 5/6 (strong diffuse), p40 (strong diffuse), and TTF-1 (focal moderate). NapsinA is negative. The overall morphology and immunoprofile are most consistent with squamous cell carcinoma.  CT Super D Chest w/o Contrast 07/23/2020 --IMPRESSION: 1. New/enlarging pulmonary metastases, as on 07/02/2020. 2. Increasing bibasilar peribronchovascular nodularity and mucoid impaction with debris in the airway, findings indicative of aspiration. 3. Aortic atherosclerosis (ICD10-I70.0). Coronary artery calcification. 4. Enlarged pulmonic trunk, indicative of pulmonary arterial hypertension. 5.  Emphysema  PET Scan 07/02/2020 --IMPRESSION: 1. Hypermetabolic nodule in the superomedial RIGHT upper lobe consistent with metastatic nodule. 2. Rounded nodule in the LEFT lower lobe consistent with metastatic pulmonary nodule. 3. Two nodules in the more inferior RIGHT upper lobe are also suspicious for metastasis. 4. No metastatic adenopathy. 5. No evidence of local recurrence in the  hypopharynx.  Tobacco/Marijuana/Snuff/ETOH use: Former smoker (quit in 11/2006) and former smokeless tobacco user (quit in 2003). Has not consumed alcohol or used marijuana since 2021  Past/Anticipated interventions by cardiothoracic surgery, if any:  07/26/2020 Dr. Leory Plowman Icard Flexible video fiberoptic bronchoscopy with electromagnetic navigation and biopsies  Past/Anticipated interventions by medical oncology, if any:  Under care of Dr. [name] 05/20/2020 -Weekly cisplatin and radiation started on 03/07/2019, many interruptions due to hospitalizations. -XRT completed on 05/08/2019 and last weekly cisplatin on 05/15/2019. --Next F/U scheduled for 08/23/2020  Signs/Symptoms Weight changes, if any: Patient denies--has been able to regain some of the weight lost previously during H&N cancer treatment Wt Readings from Last 3 Encounters:  07/30/20 130 lb 2 oz (59 kg)  07/26/20 130 lb (59 kg)  07/06/20 119 lb (54 kg)   Respiratory complaints, if any: Reports occasional dry cough. Denies any shortness of breath or wheezing Hemoptysis, if any: Patient denies Pain issues, if any:  Patient denies  SAFETY ISSUES: Prior radiation? Yes: 03/05/2019 through 05/08/2019 Site Technique Total Dose (Gy) Dose per Fx (Gy) Completed Fx Beam Energies  Larynx: HN_larynx IMRT 70/70 2 35/35 6X   Pacemaker/ICD? No  Possible current pregnancy? N/A Is the patient on methotrexate? No  Current Complaints / other details:  Denies any other issues/concerns

## 2020-07-29 NOTE — Telephone Encounter (Signed)
CALLED PATIENT'S DAUGHTER- PENNY MOSLEY TO INFORM OF PATIENT'S PFT FOR 07-30-20- ARRIVAL TIME- 1:45 PM @ [facility], PATIENT TO REPORT TO ADMITTING , PATIENT TO  NOT HAVE ANY CAFFEFINE ,OR TO DO ANY BREATHING TREATMENTS, OR SMOKE, PATIENT'S DAUGHTER VERIFIED UNDERSTANDING THIS

## 2020-07-29 NOTE — Progress Notes (Signed)
Radiation Oncology         [phone] ________________________________  Outpatient Re-Consultation  Name: [name] MRN: [id number]  Date: 07/30/2020  DOB: 1960/11/11  CC:[name], MD  Garner Nash, DO   REFERRING PHYSICIAN: Garner Nash, DO  DIAGNOSIS:    ICD-10-CM   1. Non-small cell carcinoma of right lung ([place])  C34.91     2. Malignant neoplasm of upper lobe of right lung (HCC)  C34.11     3. 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  HISTORY OF PRESENT ILLNESS::[name] is a 60 y.o. male who was treated by me in 2021 for supraglottic throat cancer. The patient's last encounter with me was for re-consultation via telephone on 06/02/20 where we discussed possible SBRT treatment in regards to his lung nodules. We discussed how adjustments to his future SBRT treatment would be dependent on his upcoming biopsy results.   Accordingly, the patient was referred to Dr. Valeta Harms on 06/17/20 for consideration of left and right lung nodal biopsies. The patient was agreeable to undergo biopsy and agreed to Dr. Fabio Bering plan.   Recent PET scan on 07/02/20 revealed the superomedial right upper lobe hypermetabolic nodule, and the rounded left lower lobe nodule to appear metastatic. Additionally, the two nodules in the more inferior right upper lobe were suspicious for metastasis. No evidence of  metastatic adenopathy or local recurrence in the hypopharynx was visualized.   Imaging on 07/23/20 again demonstrated new/enlarging pulmonary metastases as seen on PET from 07/02/20. Also seen was a bibasilar peribronchovascular nodularity and mucoid impaction with debris in the airway; noted to be indicative of aspiration.  His recent biopsy on 07/26/20  of the right upper lobe demonstrated: malignant cells present consistent with non-small cell carcinoma c/w squamous cell carcinoma. Biopsy of left lower lobe demonstrated the presence of atypical cells.    I discussed his biopsies personally with Dr Valeta Harms and reviewed his imaging personally  Respiratory complaints, if any: Reports occasional dry cough. Denies any shortness of breath or wheezing Hemoptysis, if any: Patient denies Pain issues, if any:  Patient denies   Ambulatory status? Walker? Wheelchair?: Reports he alternates between a cane and a walker to move around   SAFETY ISSUES:  Pacemaker/ICD? No Possible current pregnancy? N/A Is the patient on methotrexate? No     PREVIOUS RADIATION THERAPY: Yes, 03/05/2019 through 05/08/2019 Site Technique Total Dose (Gy) Dose per Fx (Gy) Completed Fx Beam Energies  Larynx: HN_larynx IMRT 70/70 2 35/35 6X    PAST MEDICAL HISTORY:  has a past medical history of Anemia, Anxiety, Arthritis, Cancer of larynx (HCC), Carotid artery stenosis, Chronic lower back pain, Coronary artery disease, Depression, Dilated aortic root (HCC), GERD (gastroesophageal reflux disease), History of hiatal hernia, Hypercholesteremia, Hypertension, Pneumonia, S/P percutaneous endoscopic gastrostomy (PEG) tube placement ([place]), and Stroke ([place]) (07/31/2018).    PAST SURGICAL HISTORY: Past Surgical History:  Procedure Laterality Date   BIOPSY  11/24/2019   Procedure: BIOPSY;  Surgeon: Irving Copas., MD;  Location: [facility];  Service: Gastroenterology;;   BIOPSY  03/29/2020   Procedure: BIOPSY;  Surgeon:  Irving Copas., MD;  Location: [facility];  Service: Gastroenterology;;   BRONCHIAL BIOPSY  07/26/2020   Procedure: BRONCHIAL BIOPSIES;  Surgeon: Garner Nash, DO;  Location: [facility];  Service: Pulmonary;;   BRONCHIAL BRUSHINGS  07/26/2020   Procedure: BRONCHIAL BRUSHINGS;  Surgeon: Garner Nash, DO;  Location: [facility];  Service: Pulmonary;;   BRONCHIAL NEEDLE ASPIRATION BIOPSY  07/26/2020   Procedure: BRONCHIAL NEEDLE ASPIRATION BIOPSIES;  Surgeon: Garner Nash, DO;  Location: [place] [facility];  Service: Pulmonary;;   CARDIAC CATHETERIZATION  10/29/2012   COLONOSCOPY WITH PROPOFOL N/A 11/24/2019   Procedure: COLONOSCOPY WITH PROPOFOL;  Surgeon: Irving Copas., MD;  Location: [facility];  Service: Gastroenterology;  Laterality: N/A;   CORONARY ANGIOPLASTY WITH STENT PLACEMENT  11/05/2012   DIRECT LARYNGOSCOPY N/A 01/02/2019   Procedure: MICRO DIRECT LARYNGOSCOPY BIOPSY OF LARYNGEAL MASS;  Surgeon: [name], MD;  Location: [facility];  Service: ENT;  Laterality: N/A;   ESOPHAGOGASTRODUODENOSCOPY (EGD) WITH PROPOFOL N/A 08/11/2019   Procedure: ATTEMPTED ESOPHAGOGASTRODUODENOSCOPY (EGD) WITH PROPOFOL (UNABLE TO PERFORM PERCUTANEOUS ENDOSCOPIC GASTROSTOMY TUBE REPLACEMENT);  Surgeon: [name], MD;  Location: [facility];  Service: Gastroenterology;  Laterality: N/A;   ESOPHAGOGASTRODUODENOSCOPY (EGD) WITH PROPOFOL N/A 11/24/2019   Procedure: ESOPHAGOGASTRODUODENOSCOPY (EGD) WITH PROPOFOL;  Surgeon: Rush Landmark Telford Nab., MD;  Location: [facility];  Service: Gastroenterology;  Laterality: N/A;   ESOPHAGOGASTRODUODENOSCOPY (EGD) WITH PROPOFOL N/A 01/26/2020   Procedure: ESOPHAGOGASTRODUODENOSCOPY (EGD) WITH PROPOFOL;  Surgeon: Rush Landmark Telford Nab., MD;  Location: [facility];  Service: Gastroenterology;  Laterality: N/A;   ESOPHAGOGASTRODUODENOSCOPY (EGD) WITH PROPOFOL N/A 03/29/2020   Procedure: ESOPHAGOGASTRODUODENOSCOPY (EGD) WITH PROPOFOL;  Surgeon: Rush Landmark  Telford Nab., MD;  Location: [facility];  Service: Gastroenterology;  Laterality: N/A;  NEEDS FLOURO   FIDUCIAL MARKER PLACEMENT  07/26/2020   Procedure: FIDUCIAL MARKER PLACEMENT;  Surgeon: Garner Nash, DO;  Location: [facility];  Service: Pulmonary;;   HEMORRHOID SURGERY  ~ 2010   INSERTION OF MESH N/A 06/02/2015   Procedure: INSERTION OF MESH;  Surgeon: [name], MD;  Location: [facility];  Service: General;  Laterality: N/A;   IR ANGIO INTRA EXTRACRAN SEL COM CAROTID INNOMINATE BILAT MOD SED  08/02/2018   IR ANGIO VERTEBRAL SEL VERTEBRAL BILAT MOD SED  08/02/2018   IR REPLACE G-TUBE SIMPLE WO FLUORO  08/12/2019   IR [place] GASTRO/COLONIC TUBE PERCUT W/FLUORO  08/13/2019   LAPAROSCOPIC INSERTION GASTROSTOMY TUBE N/A 02/24/2019   Procedure: LAPAROSCOPIC INSERTION GASTROSTOMY TUBE;  Surgeon: [name], MD;  Location: [facility];  Service: General;  Laterality: N/A;   LEFT HEART CATHETERIZATION WITH CORONARY ANGIOGRAM N/A 10/30/2012   Procedure: LEFT HEART CATHETERIZATION WITH CORONARY ANGIOGRAM;  Surgeon: [name], MD;  Location: [facility];  Service: Cardiovascular;  Laterality: N/A;   MULTIPLE EXTRACTIONS WITH ALVEOLOPLASTY N/A 02/18/2019   Procedure: Extraction of tooth #'s 5-7, 10,11,14,20-29, 31 and 32 with alveoloplasty and maxiillary left buccal exostoses reductions;  Surgeon: [name], [name];  Location: [facility];  Service: Oral Surgery;  Laterality: N/A;   PEG PLACEMENT N/A 01/26/2020   Procedure: PERCUTANEOUS ENDOSCOPIC GASTROSTOMY (PEG) REPLACEMENT;  Surgeon: Irving Copas., MD;  Location: [facility];  Service: Gastroenterology;  Laterality: N/A;   PERCUTANEOUS CORONARY ROTOBLATOR INTERVENTION (PCI-R) N/A 11/05/2012   Procedure: PERCUTANEOUS CORONARY ROTOBLATOR INTERVENTION (PCI-R);  Surgeon: [name], MD;  Location: [facility];  Service: Cardiovascular;  Laterality: N/A;   SAVORY DILATION N/A 11/24/2019   Procedure: SAVORY DILATION;  Surgeon: Rush Landmark  Telford Nab., MD;  Location: [facility];  Service: Gastroenterology;  Laterality: N/A;   SAVORY DILATION N/A 01/26/2020   Procedure: SAVORY DILATION;  Surgeon: Rush Landmark Telford Nab., MD;  Location: [facility];  Service: Gastroenterology;  Laterality: N/A;   TRACHEOSTOMY TUBE PLACEMENT N/A 02/18/2019   Procedure: Tracheostomy;  Surgeon: [name], MD;  Location: [facility];  Service: ENT;  Laterality: N/A;   UMBILICAL HERNIA REPAIR N/A 06/02/2015   Procedure: UMBILICAL HERNIORRHAPHY WITH MESH;  Surgeon: [name], MD;  Location[facility];  Service: General;  Laterality: N/A;   VIDEO BRONCHOSCOPY WITH ENDOBRONCHIAL NAVIGATION Bilateral 07/26/2020   Procedure: VIDEO BRONCHOSCOPY WITH ENDOBRONCHIAL NAVIGATION;  Surgeon: Garner Nash, DO;  Location: [facility];  Service: Pulmonary;  Laterality: Bilateral;  ION   VIDEO BRONCHOSCOPY WITH RADIAL ENDOBRONCHIAL ULTRASOUND  07/26/2020   Procedure: VIDEO BRONCHOSCOPY WITH RADIAL ENDOBRONCHIAL ULTRASOUND;  Surgeon: Garner Nash, DO;  Location[facility];  Service: Pulmonary;;    FAMILY HISTORY: family history includes Breast cancer in his maternal aunt and maternal aunt; Diabetes in his paternal aunt and paternal uncle; Heart disease in his father and sister; Hypertension in his father, maternal grandfather, maternal grandmother, mother, paternal grandfather, and paternal grandmother; Pancreatic cancer in his maternal uncle.  SOCIAL HISTORY:  reports that he quit smoking about 13 years ago. His smoking use included cigarettes. He started smoking about 42 years ago. He has a 96.00 pack-year smoking history. He quit smokeless tobacco use about 19 years ago.  His smokeless tobacco use included chew. He reports previous alcohol use of about 12.0 standard drinks of alcohol per week. He reports previous drug use. Drug: Marijuana.  ALLERGIES: Cymbalta [duloxetine hcl], Duloxetine, and Prednisone  MEDICATIONS:  Current Outpatient Medications  Medication Sig  Dispense Refill   acetaminophen (TYLENOL) 325 MG tablet Take 1.5 tablets (487.5 mg total) by mouth every 6 (six) hours as needed for mild pain, fever or headache (or Fever >/= 101).     aspirin EC 81 MG tablet Take 1 tablet (81 mg total) by mouth daily with breakfast. 30 tablet 4   atorvastatin (LIPITOR) 40 MG tablet Take 80 mg by mouth at bedtime.     diazepam (VALIUM) 10 MG tablet Take 10 mg by mouth 3 (three) times daily as needed.     dronabinol (MARINOL) 5 MG capsule Take 1 capsule (5 mg total) by mouth 2 (two) times daily before a meal. 60 capsule 2   DULoxetine (CYMBALTA) 60 MG capsule Take 60 mg by mouth daily.     escitalopram (LEXAPRO) 10 MG tablet Take 1 tablet (10 mg total) by mouth daily. 30 tablet 1   famotidine (PEPCID) 20 MG tablet Take 20 mg by mouth daily.     HYDROcodone-acetaminophen (NORCO) 10-325 MG tablet Take 1 tablet by mouth every 4 (four) hours as needed. 50 tablet 0   lactulose (CHRONULAC) 10 GM/15ML solution Take 30 mLs by mouth 4 (four) times daily as needed.     LINZESS 290 MCG CAPS capsule Take 290 mcg by mouth daily.     metoprolol tartrate (LOPRESSOR) 25 MG tablet Take 12.5 mg by mouth 2 (two) times daily.     mirtazapine (REMERON) 7.5 MG tablet Take 1 tablet (7.5 mg total) by mouth at bedtime. 30 tablet 2   nitroGLYCERIN (NITROSTAT) 0.4 MG SL tablet Place 1 tablet (0.4 mg  total) under the tongue every 5 (five) minutes as needed for chest pain (CP or SOB). 25 tablet 3   omeprazole (PRILOSEC) 40 MG capsule Take 1 capsule by mouth 2 (two) times daily.     potassium chloride SA (KLOR-CON) 20 MEQ tablet Take 20 mEq by mouth daily.     prochlorperazine (COMPAZINE) 10 MG tablet TAKE 1 TABLET(10 MG) BY MOUTH EVERY 6 HOURS AS NEEDED FOR NAUSEA 60 tablet 2   valproic acid (DEPAKENE) 250 MG/5ML solution Take 10 mLs (500 mg total) by mouth 3 (three) times daily. 900 mL 3   No current facility-administered medications for this encounter.    REVIEW OF SYSTEMS:  Notable for  that above.   PHYSICAL EXAM:  height is 6' (1.829 m) and weight is 130 lb 2 oz (59 kg). His temporal temperature is 96.8 F (36 C) (abnormal). His blood pressure is 112/67 and his pulse is 62. His respiration is 18 and oxygen saturation is 92%.   General: Alert and oriented, in no acute distress  HEENT: Head is normocephalic. Extraocular movements are intact.   Heart: Regular in rate and rhythm with no murmurs, rubs, or gallops. Chest: No rhonchi, wheezes, or rales. + Decreased breath sounds. Psychiatric: Judgment and insight are intact. Affect is appropriate.   ECOG = 2  0 - Asymptomatic (Fully active, able to carry on all predisease activities without restriction)  1 - Symptomatic but completely ambulatory (Restricted in physically strenuous activity but ambulatory and able to carry out work of a light or sedentary nature. For example, light housework, office work)  2 - Symptomatic, <50% in bed during the day (Ambulatory and capable of all self care but unable to carry out any work activities. Up and about more than 50% of waking hours)  3 - Symptomatic, >50% in bed, but not bedbound (Capable of only limited self-care, confined to bed or chair 50% or more of waking hours)  4 - Bedbound (Completely disabled. Cannot carry on any self-care. Totally confined to bed or chair)  5 - Death   [name], Creech RH, Tormey DC, et al. [phone]). "Toxicity and response criteria of the [facility]". [place] Oncol. 5 (6): 649-55   LABORATORY DATA:  Lab Results  Component Value Date   WBC 4.8 07/26/2020   HGB 12.2 (L) 07/26/2020   HCT 36.0 (L) 07/26/2020   MCV 106.4 (H) 07/26/2020   PLT 162 07/26/2020   CMP     Component Value Date/Time   NA 132 (L) 07/26/2020 0820   K 4.3 07/26/2020 0820   CL 91 (L) 07/26/2020 0820   CO2 31 05/29/2020 1508   GLUCOSE 80 07/26/2020 0820   BUN 15 07/26/2020 0820   BUN 13 12/24/2018 0918   CREATININE 0.70 07/26/2020 0820    CREATININE 1.23 10/28/2012 1558   CALCIUM 7.7 (L) 05/29/2020 1508   PROT 5.4 (L) 05/29/2020 1508   ALBUMIN 1.9 (L) 05/29/2020 1508   AST 13 (L) 05/29/2020 1508   ALT 6 05/29/2020 1508   ALKPHOS 176 (H) 05/29/2020 1508   BILITOT 0.5 05/29/2020 1508   GFRNONAA >60 05/29/2020 1508   GFRAA >60 08/10/2019 1538         RADIOGRAPHY: NM PET Image Restage (PS) Skull Base to Thigh  Result Date: 07/04/2020 CLINICAL DATA:  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 Lung nodule. EXAM: NUCLEAR MEDICINE PET SKULL BASE TO THIGH TECHNIQUE: 5.8 mCi F-18 FDG was injected intravenously. Full-ring PET imaging was  performed from the skull base to thigh after the radiotracer. CT data was obtained and used for attenuation correction and anatomic localization. Fasting blood glucose: 84 mg/dl COMPARISON:  CT 05/17/2020 FINDINGS: Mediastinal blood pool activity: SUV max 1.73 Liver activity: SUV max NA NECK: No hypermetabolic lymph nodes in the neck. Incidental CT findings: none CHEST: Rounded nodule in the medial RIGHT upper lobe measures 14 [name] with intense metabolic activity for size (SUV max equal 4.9). Similarly, nodule along the anterior border of the fissure within the or RIGHT upper lobe measures 8 [name] on image 79/4 with SUV max equal 3.1. Smaller nodule in the RIGHT upper lobe measuring 6 [name] on image 70 with SUV max equal 1.6. The 2 smaller RIGHT upper lobe nodules are not seen on comparison CT. Rounded nodule in the LEFT lower lobe measuring 9 [name] on image 90 with SUV max equal 3.7. Incidental CT findings: No hypermetabolic mediastinal lymph nodes. ABDOMEN/PELVIS: No abnormal hypermetabolic activity within the liver, pancreas, adrenal glands, or spleen. No hypermetabolic lymph nodes in the abdomen or pelvis. Incidental CT findings: none SKELETON: No focal hypermetabolic activity to suggest skeletal metastasis. Incidental CT findings: none IMPRESSION: 1. Hypermetabolic nodule in the superomedial RIGHT upper  lobe consistent with metastatic nodule. 2. Rounded nodule in the LEFT lower lobe consistent with metastatic pulmonary nodule. 3. Two nodules in the more inferior RIGHT upper lobe are also suspicious for metastasis. 4. No metastatic adenopathy. 5. No evidence of local recurrence in the hypopharynx. Electronically Signed   By: Suzy Bouchard M.D.   On: 07/04/2020 14:53   DG CHEST PORT 1 VIEW  Result Date: 07/26/2020 CLINICAL DATA:  Postop bronchoscopy with biopsy EXAM: PORTABLE CHEST 1 VIEW COMPARISON:  07/23/2020 FINDINGS: Normal heart size and vascularity. No effusion or pneumothorax following bronchoscopy. Medial right upper lobe nodule again noted with an adjacent radiopaque marker. Left lower lobe nodularity also noted with a radiopaque marker. Chronic bibasilar bronchial thickening/bronchiectasis and scarring again noted. No focal collapse or consolidation. Trachea midline. Aorta atherosclerotic. IMPRESSION: No complicating feature following bronchoscopy. Medial right upper lobe and left lower lobe nodules again noted now with adjacent radiopaque markers following biopsy. Electronically Signed   By: Jerilynn Mages.  Shick M.D.   On: 07/26/2020 11:03   CT Super D Chest Wo Contrast  Result Date: 07/26/2020 CLINICAL DATA:  Right upper lobe nodule. EXAM: CT CHEST WITHOUT CONTRAST TECHNIQUE: Multidetector CT imaging of the chest was performed using thin slice collimation for electromagnetic bronchoscopy planning purposes, without intravenous contrast. COMPARISON:  PET 07/02/2020 and 08/18/2019. CT chest 05/17/2020, 02/12/2020, 10/20/2019. FINDINGS: Cardiovascular: Atherosclerotic calcification of the aorta, aortic valve and coronary arteries. Pulmonic trunk is enlarged. Heart size normal. Small amount of anterior pericardial fluid is unchanged. Mediastinum/Nodes: Mediastinal lymph nodes are not enlarged by CT size criteria. Calcified right hilar lymph node. Hilar regions are otherwise difficult to definitively evaluate  without IV contrast. No axillary adenopathy. Esophagus is grossly unremarkable. Lungs/Pleura: Centrilobular and paraseptal emphysema. 1.2 x 1.6 cm nodule in the apical segment right upper lobe (4/38) has enlarged from 05/17/2020, previously 1.0 x 1.3 cm. 10 [name] left lower lobe nodule (4/114), increased from 6 [name] on 05/17/2020. New subcentimeter nodules in the right lung. Bibasilar peribronchovascular nodularity, mild bronchiectasis and scattered mucoid impaction, increased. Subsegmental volume loss in both lower lobes. Volume loss in the right middle lobe. No pleural fluid. Debris is seen in the airway. Upper Abdomen: Visualized portions of the liver, gallbladder, adrenal glands, kidneys, spleen, pancreas, stomach and bowel are  grossly unremarkable. Musculoskeletal: Degenerative changes in the spine. No worrisome lytic or sclerotic lesions. Old left rib fracture. IMPRESSION: 1. New/enlarging pulmonary metastases, as on 07/02/2020. 2. Increasing bibasilar peribronchovascular nodularity and mucoid impaction with debris in the airway, findings indicative of aspiration. 3. Aortic atherosclerosis (ICD10-I70.0). Coronary artery calcification. 4. Enlarged pulmonic trunk, indicative of pulmonary arterial hypertension. 5.  Emphysema (ICD10-J43.9). Electronically Signed   By: Lorin Picket M.D.   On: 07/26/2020 09:04   DG C-ARM BRONCHOSCOPY  Result Date: 07/26/2020 C-ARM BRONCHOSCOPY: Fluoroscopy was utilized by the requesting physician.  No radiographic interpretation.      IMPRESSION/PLAN: Today, I talked to the patient about the findings and work-up thus far.  We discussed the patient's diagnosis of carcinoma of the right upper lung and general treatment for this, highlighting the role of radiotherapy in the management.  We discussed the available radiation techniques, and focused on the details of logistics and delivery.   We discussed that I am highly suspicious of carcinoma in the left lower lung despite  definitive biopsy results.  I recommend SBRT to both lesions of 5 fractions.  I recommend surveillance of the less specific two lesions in the right lung.  We discussed the risks, benefits, and side effects of radiotherapy. Side effects may include but not necessarily be limited to: skin irritation, fatigue; injury to lung, heart, ribs, spinal cord, esophagus,  nerves, vessels, and/or trachea. No guarantees of treatment were given. A consent form was signed and placed in the patient's medical record.  The patient was encouraged to ask questions that I answered to the best of my ability.    He will proceed with RT planning today, starting RT in early August.  On date of service, in total, I spent 30 minutes on this encounter. Patient was seen in person.   __________________________________________   Eppie Gibson, MD  This document serves as a record of services personally performed by Eppie Gibson, MD. It was created on her behalf by Roney Mans, a trained medical scribe. The creation of this record is based on the scribe's personal observations and the provider's statements to them. This document has been checked and approved by the attending provider.

## 2020-07-30 ENCOUNTER — Ambulatory Visit
Admission: RE | Admit: 2020-07-30 | Discharge: 2020-07-30 | Disposition: A | Discharge status: Home / Self Care | Payer: 59 | Source: Ambulatory Visit | Attending: Radiation Oncology | Admitting: Radiation Oncology

## 2020-07-30 ENCOUNTER — Encounter (HOSPITAL_COMMUNITY): Payer: Self-pay | Admitting: Hematology

## 2020-07-30 ENCOUNTER — Ambulatory Visit (HOSPITAL_COMMUNITY)
Admission: RE | Admit: 2020-07-30 | Discharge: 2020-07-30 | Disposition: A | Discharge status: Home / Self Care | Payer: 59 | Source: Ambulatory Visit | Attending: Radiation Oncology | Admitting: Radiation Oncology

## 2020-07-30 VITALS — BP 112/67 | HR 62 | Temp 96.8°F | Resp 18 | Ht 72.0 in | Wt 130.1 lb

## 2020-07-30 DIAGNOSIS — C3491 Malignant neoplasm of unspecified part of right bronchus or lung: Secondary | ICD-10-CM

## 2020-07-30 DIAGNOSIS — E7800 Pure hypercholesterolemia, unspecified: Secondary | ICD-10-CM | POA: Insufficient documentation

## 2020-07-30 DIAGNOSIS — I2510 Atherosclerotic heart disease of native coronary artery without angina pectoris: Secondary | ICD-10-CM | POA: Insufficient documentation

## 2020-07-30 DIAGNOSIS — C7802 Secondary malignant neoplasm of left lung: Secondary | ICD-10-CM

## 2020-07-30 DIAGNOSIS — I700 Atherosclerosis of aorta: Secondary | ICD-10-CM | POA: Diagnosis not present

## 2020-07-30 DIAGNOSIS — C321 Malignant neoplasm of supraglottis: Secondary | ICD-10-CM | POA: Diagnosis not present

## 2020-07-30 DIAGNOSIS — Z7982 Long term (current) use of aspirin: Secondary | ICD-10-CM | POA: Insufficient documentation

## 2020-07-30 DIAGNOSIS — Z79899 Other long term (current) drug therapy: Secondary | ICD-10-CM | POA: Insufficient documentation

## 2020-07-30 DIAGNOSIS — Z87891 Personal history of nicotine dependence: Secondary | ICD-10-CM | POA: Insufficient documentation

## 2020-07-30 DIAGNOSIS — M545 Low back pain, unspecified: Secondary | ICD-10-CM | POA: Diagnosis not present

## 2020-07-30 DIAGNOSIS — K219 Gastro-esophageal reflux disease without esophagitis: Secondary | ICD-10-CM | POA: Insufficient documentation

## 2020-07-30 DIAGNOSIS — I10 Essential (primary) hypertension: Secondary | ICD-10-CM | POA: Insufficient documentation

## 2020-07-30 DIAGNOSIS — I2721 Secondary pulmonary arterial hypertension: Secondary | ICD-10-CM | POA: Insufficient documentation

## 2020-07-30 DIAGNOSIS — C3411 Malignant neoplasm of upper lobe, right bronchus or lung: Secondary | ICD-10-CM | POA: Diagnosis not present

## 2020-07-30 DIAGNOSIS — M129 Arthropathy, unspecified: Secondary | ICD-10-CM | POA: Insufficient documentation

## 2020-07-30 DIAGNOSIS — Z803 Family history of malignant neoplasm of breast: Secondary | ICD-10-CM | POA: Insufficient documentation

## 2020-07-30 DIAGNOSIS — K449 Diaphragmatic hernia without obstruction or gangrene: Secondary | ICD-10-CM | POA: Insufficient documentation

## 2020-07-30 DIAGNOSIS — R222 Localized swelling, mass and lump, trunk: Secondary | ICD-10-CM | POA: Insufficient documentation

## 2020-07-30 DIAGNOSIS — I6529 Occlusion and stenosis of unspecified carotid artery: Secondary | ICD-10-CM | POA: Insufficient documentation

## 2020-07-30 DIAGNOSIS — D649 Anemia, unspecified: Secondary | ICD-10-CM | POA: Insufficient documentation

## 2020-07-30 DIAGNOSIS — J432 Centrilobular emphysema: Secondary | ICD-10-CM | POA: Insufficient documentation

## 2020-07-30 DIAGNOSIS — Z800 Family history of malignant neoplasm of digestive organs: Secondary | ICD-10-CM | POA: Insufficient documentation

## 2020-07-30 DIAGNOSIS — Z85818 Personal history of malignant neoplasm of other sites of lip, oral cavity, and pharynx: Secondary | ICD-10-CM | POA: Insufficient documentation

## 2020-07-30 LAB — PULMONARY FUNCTION TEST
DL/VA % pred: 56 %
DL/VA: 2.41 ml/min/mmHg/L
DLCO unc % pred: 43 %
DLCO unc: 12.85 ml/min/mmHg
FEF 25-75 Post: 0.82 L/sec
FEF 25-75 Pre: 1.08 L/sec
FEF2575-%Change-Post: -24 %
FEF2575-%Pred-Post: 25 %
FEF2575-%Pred-Pre: 33 %
FEV1-%Change-Post: -12 %
FEV1-%Pred-Post: 41 %
FEV1-%Pred-Pre: 47 %
FEV1-Post: 1.64 L
FEV1-Pre: 1.87 L
FEV1FVC-%Change-Post: -16 %
FEV1FVC-%Pred-Pre: 64 %
FEV6-%Change-Post: 2 %
FEV6-%Pred-Post: 74 %
FEV6-%Pred-Pre: 71 %
FEV6-Post: 3.67 L
FEV6-Pre: 3.56 L
FEV6FVC-%Change-Post: -1 %
FEV6FVC-%Pred-Post: 97 %
FEV6FVC-%Pred-Pre: 98 %
FVC-%Change-Post: 4 %
FVC-%Pred-Post: 76 %
FVC-%Pred-Pre: 73 %
FVC-Post: 3.95 L
FVC-Pre: 3.79 L
Post FEV1/FVC ratio: 41 %
Post FEV6/FVC ratio: 93 %
Pre FEV1/FVC ratio: 49 %
Pre FEV6/FVC Ratio: 94 %
RV % pred: 157 %
RV: 3.63 L
TLC % pred: 93 %
TLC: 6.91 L

## 2020-07-30 MED ORDER — ALBUTEROL SULFATE (2.5 MG/3ML) 0.083% IN NEBU
2.5000 mg | INHALATION_SOLUTION | Freq: Once | RESPIRATORY_TRACT | Status: AC
  Administered 2020-07-30: 2.5 mg via RESPIRATORY_TRACT

## 2020-07-31 ENCOUNTER — Encounter: Payer: Self-pay | Admitting: Radiation Oncology

## 2020-07-31 DIAGNOSIS — C7802 Secondary malignant neoplasm of left lung: Secondary | ICD-10-CM | POA: Insufficient documentation

## 2020-07-31 DIAGNOSIS — C3411 Malignant neoplasm of upper lobe, right bronchus or lung: Secondary | ICD-10-CM | POA: Insufficient documentation

## 2020-08-09 DIAGNOSIS — I2721 Secondary pulmonary arterial hypertension: Secondary | ICD-10-CM | POA: Diagnosis not present

## 2020-08-09 DIAGNOSIS — I6529 Occlusion and stenosis of unspecified carotid artery: Secondary | ICD-10-CM | POA: Diagnosis not present

## 2020-08-09 DIAGNOSIS — E7800 Pure hypercholesterolemia, unspecified: Secondary | ICD-10-CM | POA: Insufficient documentation

## 2020-08-09 DIAGNOSIS — Z87891 Personal history of nicotine dependence: Secondary | ICD-10-CM | POA: Diagnosis not present

## 2020-08-09 DIAGNOSIS — Z510 Encounter for antineoplastic radiation therapy: Secondary | ICD-10-CM | POA: Diagnosis present

## 2020-08-09 DIAGNOSIS — I10 Essential (primary) hypertension: Secondary | ICD-10-CM | POA: Diagnosis not present

## 2020-08-09 DIAGNOSIS — D649 Anemia, unspecified: Secondary | ICD-10-CM | POA: Diagnosis not present

## 2020-08-09 DIAGNOSIS — K219 Gastro-esophageal reflux disease without esophagitis: Secondary | ICD-10-CM | POA: Insufficient documentation

## 2020-08-09 DIAGNOSIS — C3411 Malignant neoplasm of upper lobe, right bronchus or lung: Secondary | ICD-10-CM | POA: Diagnosis not present

## 2020-08-09 DIAGNOSIS — C7802 Secondary malignant neoplasm of left lung: Secondary | ICD-10-CM | POA: Insufficient documentation

## 2020-08-09 DIAGNOSIS — I700 Atherosclerosis of aorta: Secondary | ICD-10-CM | POA: Diagnosis not present

## 2020-08-09 DIAGNOSIS — R222 Localized swelling, mass and lump, trunk: Secondary | ICD-10-CM | POA: Diagnosis not present

## 2020-08-09 DIAGNOSIS — M545 Low back pain, unspecified: Secondary | ICD-10-CM | POA: Diagnosis not present

## 2020-08-09 DIAGNOSIS — I2510 Atherosclerotic heart disease of native coronary artery without angina pectoris: Secondary | ICD-10-CM | POA: Diagnosis not present

## 2020-08-09 DIAGNOSIS — K449 Diaphragmatic hernia without obstruction or gangrene: Secondary | ICD-10-CM | POA: Insufficient documentation

## 2020-08-09 DIAGNOSIS — J432 Centrilobular emphysema: Secondary | ICD-10-CM | POA: Diagnosis not present

## 2020-08-09 DIAGNOSIS — M129 Arthropathy, unspecified: Secondary | ICD-10-CM | POA: Diagnosis not present

## 2020-08-10 ENCOUNTER — Other Ambulatory Visit: Payer: Self-pay | Admitting: *Deleted

## 2020-08-10 ENCOUNTER — Other Ambulatory Visit: Payer: Self-pay

## 2020-08-10 ENCOUNTER — Ambulatory Visit
Admission: RE | Admit: 2020-08-10 | Discharge: 2020-08-10 | Disposition: A | Discharge status: Home / Self Care | Payer: 59 | Source: Ambulatory Visit | Attending: Radiation Oncology | Admitting: Radiation Oncology

## 2020-08-10 DIAGNOSIS — Z510 Encounter for antineoplastic radiation therapy: Secondary | ICD-10-CM | POA: Diagnosis not present

## 2020-08-11 ENCOUNTER — Ambulatory Visit: Payer: 59 | Admitting: Radiation Oncology

## 2020-08-12 ENCOUNTER — Other Ambulatory Visit: Payer: Self-pay

## 2020-08-12 ENCOUNTER — Ambulatory Visit
Admission: RE | Admit: 2020-08-12 | Discharge: 2020-08-12 | Disposition: A | Discharge status: Home / Self Care | Payer: 59 | Source: Ambulatory Visit | Attending: Radiation Oncology | Admitting: Radiation Oncology

## 2020-08-12 ENCOUNTER — Ambulatory Visit: Payer: 59 | Admitting: Radiation Oncology

## 2020-08-12 DIAGNOSIS — Z510 Encounter for antineoplastic radiation therapy: Secondary | ICD-10-CM | POA: Diagnosis not present

## 2020-08-13 ENCOUNTER — Ambulatory Visit: Payer: 59 | Admitting: Radiation Oncology

## 2020-08-16 ENCOUNTER — Ambulatory Visit
Admission: RE | Admit: 2020-08-16 | Discharge: 2020-08-16 | Disposition: A | Discharge status: Home / Self Care | Payer: 59 | Source: Ambulatory Visit | Attending: Radiation Oncology | Admitting: Radiation Oncology

## 2020-08-16 ENCOUNTER — Other Ambulatory Visit: Payer: Self-pay

## 2020-08-16 DIAGNOSIS — Z510 Encounter for antineoplastic radiation therapy: Secondary | ICD-10-CM | POA: Diagnosis not present

## 2020-08-17 ENCOUNTER — Inpatient Hospital Stay (HOSPITAL_COMMUNITY): Payer: 59 | Attending: Hematology

## 2020-08-17 ENCOUNTER — Ambulatory Visit (HOSPITAL_COMMUNITY): Payer: 59

## 2020-08-17 ENCOUNTER — Ambulatory Visit: Payer: 59 | Admitting: Radiation Oncology

## 2020-08-17 DIAGNOSIS — C3432 Malignant neoplasm of lower lobe, left bronchus or lung: Secondary | ICD-10-CM | POA: Insufficient documentation

## 2020-08-17 DIAGNOSIS — Z930 Tracheostomy status: Secondary | ICD-10-CM | POA: Diagnosis not present

## 2020-08-17 DIAGNOSIS — C3411 Malignant neoplasm of upper lobe, right bronchus or lung: Secondary | ICD-10-CM | POA: Insufficient documentation

## 2020-08-17 DIAGNOSIS — C760 Malignant neoplasm of head, face and neck: Secondary | ICD-10-CM

## 2020-08-17 DIAGNOSIS — Z923 Personal history of irradiation: Secondary | ICD-10-CM | POA: Diagnosis not present

## 2020-08-17 DIAGNOSIS — Z803 Family history of malignant neoplasm of breast: Secondary | ICD-10-CM | POA: Diagnosis not present

## 2020-08-17 DIAGNOSIS — Z931 Gastrostomy status: Secondary | ICD-10-CM | POA: Diagnosis not present

## 2020-08-17 DIAGNOSIS — Z7982 Long term (current) use of aspirin: Secondary | ICD-10-CM | POA: Diagnosis not present

## 2020-08-17 DIAGNOSIS — Z800 Family history of malignant neoplasm of digestive organs: Secondary | ICD-10-CM | POA: Diagnosis not present

## 2020-08-17 DIAGNOSIS — Z8673 Personal history of transient ischemic attack (TIA), and cerebral infarction without residual deficits: Secondary | ICD-10-CM | POA: Diagnosis not present

## 2020-08-17 DIAGNOSIS — Z8521 Personal history of malignant neoplasm of larynx: Secondary | ICD-10-CM | POA: Diagnosis not present

## 2020-08-17 DIAGNOSIS — C321 Malignant neoplasm of supraglottis: Secondary | ICD-10-CM

## 2020-08-17 DIAGNOSIS — R197 Diarrhea, unspecified: Secondary | ICD-10-CM

## 2020-08-17 LAB — COMPREHENSIVE METABOLIC PANEL
ALT: 10 U/L (ref 0–44)
AST: 14 U/L — ABNORMAL LOW (ref 15–41)
Albumin: 3.4 g/dL — ABNORMAL LOW (ref 3.5–5.0)
Alkaline Phosphatase: 42 U/L (ref 38–126)
Anion gap: 6 (ref 5–15)
BUN: 25 mg/dL — ABNORMAL HIGH (ref 6–20)
CO2: 32 mmol/L (ref 22–32)
Calcium: 8.8 mg/dL — ABNORMAL LOW (ref 8.9–10.3)
Chloride: 91 mmol/L — ABNORMAL LOW (ref 98–111)
Creatinine, Ser: 0.71 mg/dL (ref 0.61–1.24)
GFR, Estimated: >60 mL/min (ref >60)
Glucose, Bld: 78 mg/dL (ref 70–99)
Potassium: 4.1 mmol/L (ref 3.5–5.1)
Sodium: 129 mmol/L — ABNORMAL LOW (ref 135–145)
Total Bilirubin: 0.5 mg/dL (ref 0.3–1.2)
Total Protein: 7 g/dL (ref 6.5–8.1)

## 2020-08-17 LAB — IRON AND TIBC
Iron: 60 ug/dL (ref 45–182)
Saturation Ratios: 17 % — ABNORMAL LOW (ref 17.9–39.5)
TIBC: 353 ug/dL (ref 250–450)
UIBC: 293 ug/dL

## 2020-08-17 LAB — CBC WITH DIFFERENTIAL/PLATELET
Abs Immature Granulocytes: 0.01 10*3/uL (ref 0.00–0.07)
Basophils Absolute: 0 10*3/uL (ref 0.0–0.1)
Basophils Relative: 1 %
Eosinophils Absolute: 0.3 10*3/uL (ref 0.0–0.5)
Eosinophils Relative: 8 %
HCT: 36 % — ABNORMAL LOW (ref 39.0–52.0)
Hemoglobin: 11.6 g/dL — ABNORMAL LOW (ref 13.0–17.0)
Immature Granulocytes: 0 %
Lymphocytes Relative: 22 %
Lymphs Abs: 0.9 10*3/uL (ref 0.7–4.0)
MCH: 31.8 pg (ref 26.0–34.0)
MCHC: 32.2 g/dL (ref 30.0–36.0)
MCV: 98.6 fL (ref 80.0–100.0)
Monocytes Absolute: 0.6 10*3/uL (ref 0.1–1.0)
Monocytes Relative: 15 %
Neutro Abs: 2.2 10*3/uL (ref 1.7–7.7)
Neutrophils Relative %: 54 %
Platelets: 176 10*3/uL (ref 150–400)
RBC: 3.65 MIL/uL — ABNORMAL LOW (ref 4.22–5.81)
RDW: 12.8 % (ref 11.5–15.5)
WBC: 4 10*3/uL (ref 4.0–10.5)
nRBC: 0 % (ref 0.0–0.2)

## 2020-08-17 LAB — FOLATE: Folate: 14.1 ng/mL (ref >5.9)

## 2020-08-17 LAB — TSH: TSH: 5.489 u[IU]/mL — ABNORMAL HIGH (ref 0.350–4.500)

## 2020-08-17 LAB — FERRITIN: Ferritin: 419 ng/mL — ABNORMAL HIGH (ref 24–336)

## 2020-08-17 LAB — VITAMIN B12: Vitamin B-12: 809 pg/mL (ref 180–914)

## 2020-08-18 ENCOUNTER — Other Ambulatory Visit: Payer: Self-pay

## 2020-08-18 ENCOUNTER — Ambulatory Visit
Admission: RE | Admit: 2020-08-18 | Discharge: 2020-08-18 | Disposition: A | Discharge status: Home / Self Care | Payer: 59 | Source: Ambulatory Visit | Attending: Radiation Oncology | Admitting: Radiation Oncology

## 2020-08-18 DIAGNOSIS — Z510 Encounter for antineoplastic radiation therapy: Secondary | ICD-10-CM | POA: Diagnosis not present

## 2020-08-18 NOTE — Patient Outreach (Signed)
[place] [facility]) Care Management  08/18/2020  CHAEL URENDA 11/04/1960 [id number]  First telephone outreach attempt to complete [facility] patient satisfaction survey. No answer. Left message for returned call.  Ina Homes [facility] Management Assistant [phone]

## 2020-08-20 ENCOUNTER — Ambulatory Visit
Admission: RE | Admit: 2020-08-20 | Discharge: 2020-08-20 | Disposition: A | Discharge status: Home / Self Care | Payer: 59 | Source: Ambulatory Visit | Attending: Radiation Oncology | Admitting: Radiation Oncology

## 2020-08-20 ENCOUNTER — Other Ambulatory Visit: Payer: Self-pay

## 2020-08-20 ENCOUNTER — Encounter: Payer: Self-pay | Admitting: Radiation Oncology

## 2020-08-20 DIAGNOSIS — Z510 Encounter for antineoplastic radiation therapy: Secondary | ICD-10-CM | POA: Diagnosis not present

## 2020-08-21 NOTE — Progress Notes (Signed)
[name], [place] [address]   CLINIC:  Medical Oncology/Hematology  PCP:  [name], [place] / [place] [address] [phone]   REASON FOR VISIT:  Follow-up for supraglottic squamous cell carcinoma  PRIOR THERAPY: Chemoradiation with weekly cisplatin from 03/07/2019 to 05/15/2019  NGS Results: not done  CURRENT THERAPY: surveillance  BRIEF ONCOLOGIC HISTORY:  Oncology History  Malignant neoplasm of supraglottis ([place])  01/22/2019 Initial Diagnosis   Malignant neoplasm of supraglottis ([place])   01/30/2019 Cancer Staging   Staging form: Larynx - Supraglottis, AJCC 8th Edition - Clinical: Stage IVA (cT2, cN2c, cM0) - Signed by [name], MD on 01/30/2019   03/07/2019 -  Chemotherapy   The patient had palonosetron (ALOXI) injection 0.25 mg, 0.25 mg, Intravenous,  Once, 6 of 6 cycles Administration: 0.25 mg (03/13/2019), 0.25 mg (03/07/2019), 0.25 mg (03/27/2019), 0.25 mg (05/01/2019), 0.25 mg (05/08/2019), 0.25 mg (05/15/2019) CISplatin (PLATINOL) 79 mg in sodium chloride 0.9 % 250 mL chemo infusion, 40 mg/m2 = 79 mg, Intravenous,  Once, 6 of 6 cycles Administration: 79 mg (03/13/2019), 79 mg (03/07/2019), 79 mg (03/27/2019), 79 mg (05/01/2019), 79 mg (05/08/2019), 79 mg (05/15/2019) fosaprepitant (EMEND) 150 mg in sodium chloride 0.9 % 145 mL IVPB, 150 mg, Intravenous,  Once, 6 of 6 cycles Administration: 150 mg (03/13/2019), 150 mg (03/07/2019), 150 mg (03/27/2019), 150 mg (05/01/2019), 150 mg (05/08/2019), 150 mg (05/15/2019)   for chemotherapy treatment.       CANCER STAGING: Cancer Staging Malignant neoplasm of supraglottis [facility]) Staging form: Larynx - Supraglottis, AJCC 8th Edition - Clinical: Stage IVA (cT2, cN2c, cM0) - Signed by [name], MD on 01/30/2019   INTERVAL HISTORY:  [name], a 60 y.o. male, returns for routine follow-up of his supraglottic squamous cell carcinoma. [name] was last seen on  05/20/20.   Today he reports feeling well. He completed radiation on 08/20/2020. He has been drinking Ensure every day.   REVIEW OF SYSTEMS:  Review of Systems  Constitutional:  Positive for fatigue (60%). Negative for appetite change (75%).  Gastrointestinal:  Positive for constipation (resolved).  Psychiatric/Behavioral:  Positive for sleep disturbance (falling asleep).   All other systems reviewed and are negative.  PAST MEDICAL/SURGICAL HISTORY:  Past Medical History:  Diagnosis Date   Anemia    Anxiety    Arthritis    Cancer of larynx (HCC)    Carotid artery stenosis    Chronic lower back pain    Coronary artery disease    a. Diag cath 10/2012 for CP/abnormal nuc -> PCI s/p LAD atherectomy/DES placement 11/05/12.   Depression    Dilated aortic root (HCC)    GERD (gastroesophageal reflux disease)    History of hiatal hernia    Hypercholesteremia    Hypertension    Pneumonia    S/P percutaneous endoscopic gastrostomy (PEG) tube placement [facility])    Stroke ([place]) 07/31/2018   Pons   Past Surgical History:  Procedure Laterality Date   BIOPSY  11/24/2019   Procedure: BIOPSY;  Surgeon: Irving Copas., MD;  Location: [place];  Service: Gastroenterology;;   BIOPSY  03/29/2020   Procedure: BIOPSY;  Surgeon: Irving Copas., MD;  Location: [facility];  Service: Gastroenterology;;   BRONCHIAL BIOPSY  07/26/2020   Procedure: BRONCHIAL BIOPSIES;  Surgeon: Garner Nash, DO;  Location: [facility];  Service: Pulmonary;;   BRONCHIAL BRUSHINGS  07/26/2020   Procedure: BRONCHIAL BRUSHINGS;  Surgeon: Garner Nash, DO;  [facility];  Service: Pulmonary;;   BRONCHIAL NEEDLE ASPIRATION BIOPSY  07/26/2020   Procedure: BRONCHIAL NEEDLE ASPIRATION BIOPSIES;  Surgeon: Garner Nash, DO;  Location: [place];  Service: Pulmonary;;   CARDIAC CATHETERIZATION  10/29/2012   COLONOSCOPY WITH PROPOFOL N/A 11/24/2019   Procedure: COLONOSCOPY WITH PROPOFOL;   Surgeon: Irving Copas., MD;  Location: [place];  Service: Gastroenterology;  Laterality: N/A;   CORONARY ANGIOPLASTY WITH STENT PLACEMENT  11/05/2012   DIRECT LARYNGOSCOPY N/A 01/02/2019   Procedure: MICRO DIRECT LARYNGOSCOPY BIOPSY OF LARYNGEAL MASS;  Surgeon: [name], MD;  Location: [place];  Service: ENT;  Laterality: N/A;   ESOPHAGOGASTRODUODENOSCOPY (EGD) WITH PROPOFOL N/A 08/11/2019   Procedure: ATTEMPTED ESOPHAGOGASTRODUODENOSCOPY (EGD) WITH PROPOFOL (UNABLE TO PERFORM PERCUTANEOUS ENDOSCOPIC GASTROSTOMY TUBE REPLACEMENT);  Surgeon: [name], MD;  [facility];  Service: Gastroenterology;  Laterality: N/A;   ESOPHAGOGASTRODUODENOSCOPY (EGD) WITH PROPOFOL N/A 11/24/2019   Procedure: ESOPHAGOGASTRODUODENOSCOPY (EGD) WITH PROPOFOL;  Surgeon: Rush Landmark Telford Nab., MD;  Location: [facility];  Service: Gastroenterology;  Laterality: N/A;   ESOPHAGOGASTRODUODENOSCOPY (EGD) WITH PROPOFOL N/A 01/26/2020   Procedure: ESOPHAGOGASTRODUODENOSCOPY (EGD) WITH PROPOFOL;  Surgeon: Rush Landmark Telford Nab., MD;  Location: [place];  Service: Gastroenterology;  Laterality: N/A;   ESOPHAGOGASTRODUODENOSCOPY (EGD) WITH PROPOFOL N/A 03/29/2020   Procedure: ESOPHAGOGASTRODUODENOSCOPY (EGD) WITH PROPOFOL;  Surgeon: Rush Landmark Telford Nab., MD;  Location: [facility];  Service: Gastroenterology;  Laterality: N/A;  NEEDS FLOURO   FIDUCIAL MARKER PLACEMENT  07/26/2020   Procedure: FIDUCIAL MARKER PLACEMENT;  Surgeon: Garner Nash, DO;  Location: [facility];  Service: Pulmonary;;   HEMORRHOID SURGERY  ~ 2010   INSERTION OF MESH N/A 06/02/2015   Procedure: INSERTION OF MESH;  Surgeon: [name], MD;  [facility];  Service: General;  Laterality: N/A;   IR ANGIO INTRA EXTRACRAN SEL COM CAROTID INNOMINATE BILAT MOD SED  08/02/2018   IR ANGIO VERTEBRAL SEL VERTEBRAL BILAT MOD SED  08/02/2018   IR REPLACE G-TUBE SIMPLE WO FLUORO  08/12/2019   IR [facility] GASTRO/COLONIC TUBE PERCUT W/FLUORO   08/13/2019   LAPAROSCOPIC INSERTION GASTROSTOMY TUBE N/A 02/24/2019   Procedure: LAPAROSCOPIC INSERTION GASTROSTOMY TUBE;  Surgeon: [name], MD;  Location: [facility];  Service: General;  Laterality: N/A;   LEFT HEART CATHETERIZATION WITH CORONARY ANGIOGRAM N/A 10/30/2012   Procedure: LEFT HEART CATHETERIZATION WITH CORONARY ANGIOGRAM;  Surgeon: [name], MD;  Location: [facility];  Service: Cardiovascular;  Laterality: N/A;   MULTIPLE EXTRACTIONS WITH ALVEOLOPLASTY N/A 02/18/2019   Procedure: Extraction of tooth #'s 5-7, 10,11,14,20-29, 31 and 32 with alveoloplasty and maxiillary left buccal exostoses reductions;  Surgeon: [name], [name];  Location: [place];  Service: Oral Surgery;  Laterality: N/A;   PEG PLACEMENT N/A 01/26/2020   Procedure: PERCUTANEOUS ENDOSCOPIC GASTROSTOMY (PEG) REPLACEMENT;  Surgeon: Irving Copas., MD;  Location: [facility];  Service: Gastroenterology;  Laterality: N/A;   PERCUTANEOUS CORONARY ROTOBLATOR INTERVENTION (PCI-R) N/A 11/05/2012   Procedure: PERCUTANEOUS CORONARY ROTOBLATOR INTERVENTION (PCI-R);  Surgeon: [name], MD;  [facility];  Service: Cardiovascular;  Laterality: N/A;   SAVORY DILATION N/A 11/24/2019   Procedure: SAVORY DILATION;  Surgeon: Rush Landmark Telford Nab., MD;  Location: [facility];  Service: Gastroenterology;  Laterality: N/A;   SAVORY DILATION N/A 01/26/2020   Procedure: SAVORY DILATION;  Surgeon: Rush Landmark Telford Nab., MD;  Location: [place];  Service: Gastroenterology;  Laterality: N/A;   TRACHEOSTOMY TUBE PLACEMENT N/A 02/18/2019   Procedure: Tracheostomy;  Surgeon: [name], MD;  Location: [facility] [facility];  Service: ENT;  Laterality: N/A;   UMBILICAL HERNIA REPAIR N/A 06/02/2015   Procedure: UMBILICAL HERNIORRHAPHY WITH MESH;  Surgeon: [name], MD;  [facility];  Service: General;  Laterality: N/A;   VIDEO BRONCHOSCOPY WITH ENDOBRONCHIAL NAVIGATION Bilateral 07/26/2020   Procedure: VIDEO  BRONCHOSCOPY WITH ENDOBRONCHIAL NAVIGATION;  Surgeon: Garner Nash, DO;  Location: [place];  Service: Pulmonary;  Laterality: Bilateral;  ION   VIDEO BRONCHOSCOPY WITH RADIAL ENDOBRONCHIAL ULTRASOUND  07/26/2020   Procedure: VIDEO BRONCHOSCOPY WITH RADIAL ENDOBRONCHIAL ULTRASOUND;  Surgeon: Garner Nash, DO;  Location: [facility];  Service: Pulmonary;;    SOCIAL HISTORY:  Social History   Socioeconomic History   Marital status: Married    Spouse name: [name]   Number of children: 0   Years of education: Not on file   Highest education level: Not on file  Occupational History   Occupation: Disabled    Comment: previously worked in Nurse, learning disability as an Clinical biochemist  Tobacco Use   Smoking status: Former    Packs/day: 3.00    Years: 32.00    Pack years: 96.00    Types: Cigarettes    Start date: 08/29/1977    Quit date: 11/29/2006    Years since quitting: 13.7   Smokeless tobacco: Former    Types: Chew    Quit date: 2003   Tobacco comments:    11/05/2012 "chewed tobacco when I play ball; haven't chewed since age 80"  Vaping Use   Vaping Use: Never used  Substance and Sexual Activity   Alcohol use: Not Currently    Alcohol/week: 12.0 standard drinks    Types: 12 Cans of beer per week    Comment: per wife [name] 6-7/week now as of 02/24/19   Drug use: Not Currently    Types: Marijuana    Comment: Last use was on - denies on 04/08/19   Sexual activity: Not on file  Other Topics Concern   Not on file  Social History Narrative   Lives with wife at home   Right Handed   Drinks 5-6 cups caffeine daily   Social Determinants of Health   Financial Resource Strain: Not on file  Food Insecurity: Not on file  Transportation Needs: Not on file  Physical Activity: Not on file  Stress: Not on file  Social Connections: Not on file  Intimate Partner Violence: Not on file    FAMILY HISTORY:  Family History  Problem Relation Age of Onset   Hypertension Mother     Heart disease Father    Hypertension Father    Heart disease Sister    Hypertension Maternal Grandmother    Hypertension Maternal Grandfather    Hypertension Paternal Grandmother    Hypertension Paternal Grandfather    Pancreatic cancer Maternal Uncle    Breast cancer Maternal Aunt    Breast cancer Maternal Aunt    Diabetes Paternal Uncle    Diabetes Paternal Aunt     CURRENT MEDICATIONS:  Current Outpatient Medications  Medication Sig Dispense Refill   aspirin EC 81 MG tablet Take 1 tablet (81 mg total) by mouth daily with breakfast. 30 tablet 4   atorvastatin (LIPITOR) 40 MG tablet Take 80 mg by mouth at bedtime.     diazepam (VALIUM) 10 MG tablet Take 10 mg by mouth 3 (three) times daily as needed.     dronabinol (MARINOL) 5 MG capsule TAKE 1 CAPSULE(5 MG) BY MOUTH TWICE DAILY BEFORE A MEAL 60 capsule 3  DULoxetine (CYMBALTA) 60 MG capsule Take 60 mg by mouth daily.     escitalopram (LEXAPRO) 10 MG tablet Take 1 tablet (10 mg total) by mouth daily. 30 tablet 1   famotidine (PEPCID) 20 MG tablet Take 20 mg by mouth daily.     HYDROcodone-acetaminophen (NORCO) 10-325 MG tablet Take 1 tablet by mouth every 4 (four) hours as needed. 50 tablet 0   lactulose (CHRONULAC) 10 GM/15ML solution Take 30 mLs by mouth 4 (four) times daily as needed.     LINZESS 290 MCG CAPS capsule Take 290 mcg by mouth daily.     metoprolol tartrate (LOPRESSOR) 25 MG tablet Take 12.5 mg by mouth 2 (two) times daily.     mirtazapine (REMERON) 7.5 MG tablet Take 1 tablet (7.5 mg total) by mouth at bedtime. 30 tablet 2   nitroGLYCERIN (NITROSTAT) 0.4 MG SL tablet Place 1 tablet (0.4 mg total) under the tongue every 5 (five) minutes as needed for chest pain (CP or SOB). 25 tablet 3   omeprazole (PRILOSEC) 20 MG capsule Take 20 mg by mouth every morning.     potassium chloride SA (KLOR-CON) 20 MEQ tablet Take 20 mEq by mouth daily.     valproic acid (DEPAKENE) 250 MG/5ML solution Take 10 mLs (500 mg total) by  mouth 3 (three) times daily. 900 mL 3   acetaminophen (TYLENOL) 325 MG tablet Take 1.5 tablets (487.5 mg total) by mouth every 6 (six) hours as needed for mild pain, fever or headache (or Fever >/= 101). (Patient not taking: Reported on 08/23/2020)     prochlorperazine (COMPAZINE) 10 MG tablet TAKE 1 TABLET(10 MG) BY MOUTH EVERY 6 HOURS AS NEEDED FOR NAUSEA (Patient not taking: Reported on 08/23/2020) 60 tablet 2   No current facility-administered medications for this visit.    ALLERGIES:  Allergies  Allergen Reactions   Cymbalta [Duloxetine Hcl]    Duloxetine Other (See Comments)    Caused depression/ patient is taking   Prednisone Other (See Comments)    Chest pain    PHYSICAL EXAM:  Performance status (ECOG): 1 - Symptomatic but completely ambulatory  Vitals:   08/23/20 1524  BP: 140/74  Pulse: 64  Resp: 16  Temp: 97.7 F (36.5 C)  SpO2: 100%   Wt Readings from Last 3 Encounters:  08/23/20 134 lb 3.2 oz (60.9 kg)  07/30/20 130 lb 2 oz (59 kg)  07/26/20 130 lb (59 kg)   Physical Exam Vitals reviewed.  Constitutional:      Appearance: Normal appearance.     Comments: In wheelchair  Cardiovascular:     Rate and Rhythm: Normal rate and regular rhythm.     Pulses: Normal pulses.     Heart sounds: Normal heart sounds.  Pulmonary:     Effort: Pulmonary effort is normal.     Breath sounds: Normal breath sounds.  Neurological:     General: No focal deficit present.     Mental Status: He is alert and oriented to person, place, and time.  Psychiatric:        Mood and Affect: Mood normal.        Behavior: Behavior normal.     LABORATORY DATA:  I have reviewed the labs as listed.  CBC Latest Ref Rng & Units 08/17/2020 07/26/2020 07/26/2020  WBC 4.0 - 10.5 K/uL 4.0 - 4.8  Hemoglobin 13.0 - 17.0 g/dL 11.6(L) 12.2(L) 10.8(L)  Hematocrit 39.0 - 52.0 % 36.0(L) 36.0(L) 36.6(L)  Platelets 150 - 400 K/uL 176 -  162   CMP Latest Ref Rng & Units 08/17/2020 07/26/2020 05/29/2020   Glucose 70 - 99 mg/dL 78 80 82  BUN 6 - 20 mg/dL 25(H) 15 9  Creatinine 0.61 - 1.24 mg/dL 0.71 0.70 0.74  Sodium 135 - 145 mmol/L 129(L) 132(L) 129(L)  Potassium 3.5 - 5.1 mmol/L 4.1 4.3 4.3  Chloride 98 - 111 mmol/L 91(L) 91(L) 95(L)  CO2 22 - 32 mmol/L 32 - 31  Calcium 8.9 - 10.3 mg/dL 8.8(L) - 7.7(L)  Total Protein 6.5 - 8.1 g/dL 7.0 - 5.4(L)  Total Bilirubin 0.3 - 1.2 mg/dL 0.5 - 0.5  Alkaline Phos 38 - 126 U/L 42 - 176(H)  AST 15 - 41 U/L 14(L) - 13(L)  ALT 0 - 44 U/L 10 - 6    DIAGNOSTIC IMAGING:  I have independently reviewed the scans and discussed with the patient. DG CHEST PORT 1 VIEW  Result Date: 07/26/2020 CLINICAL DATA:  Postop bronchoscopy with biopsy EXAM: PORTABLE CHEST 1 VIEW COMPARISON:  07/23/2020 FINDINGS: Normal heart size and vascularity. No effusion or pneumothorax following bronchoscopy. Medial right upper lobe nodule again noted with an adjacent radiopaque marker. Left lower lobe nodularity also noted with a radiopaque marker. Chronic bibasilar bronchial thickening/bronchiectasis and scarring again noted. No focal collapse or consolidation. Trachea midline. Aorta atherosclerotic. IMPRESSION: No complicating feature following bronchoscopy. Medial right upper lobe and left lower lobe nodules again noted now with adjacent radiopaque markers following biopsy. Electronically Signed   By: Jerilynn Mages.  Shick M.D.   On: 07/26/2020 11:03   DG C-ARM BRONCHOSCOPY  Result Date: 07/26/2020 C-ARM BRONCHOSCOPY: Fluoroscopy was utilized by the requesting physician.  No radiographic interpretation.     ASSESSMENT:  1.  T3N2C supraglottic squamous cell carcinoma: -Laryngoscopy and biopsy on 01/02/2019, trach placed on 02/18/2019. -PET scan on 01/27/2019 showed hypermetabolic laryngeal lesion with hypermetabolic cervical metastatic disease bilaterally. -Weekly cisplatin and radiation started on 03/07/2019, many interruptions due to hospitalizations. -XRT completed on 05/08/2019 and last  weekly cisplatin on 05/15/2019. -CT angio chest on 10/20/2019 showed 6 mm right upper lobe lung nodule increased in size from prior PET scan.  Resolved subpleural nodules laterally in the right upper lobe.  Subcentimeter hyperenhancing liver lesions. -PET scan on 11/13/2059 showed moderate hypermetabolism noted in the prior tracheostomy tract, likely granulation tissue.  No lymphadenopathy in the neck.  6 mm right upper lobe nodule is mildly hypermetabolic with SUV 7.61.  5 mm left axillary lymph node with SUV 3.49.  6.5 mm precarinal lymph node slightly enlarged from 5 mm.  Difficult to measure bilateral hilar lymph nodes are hypermetabolic.   2.  Nutrition: -He has a PEG tube.  He is using Osmolite 1.53 to 4 cans/day.   3.  CVA: -She had pontine stroke with large vessel occlusion. -He had bleeding from trach site while on Plavix which was held since 03/04/2019.   PLAN:  1.  Supraglottic squamous cell carcinoma: - PET scan on 07/02/2020 with no evidence of recurrence in the head and neck region.  No metastatic adenopathy. - Reviewed labs today which showed normal LFTs.  CBC was grossly normal.  TSH was 5.4. - Continue follow-up with Dr. Benjamine Mola. - RTC 5 months with repeat scan of the neck and TSH.   2.  Nutrition: - He is drinking Ensure.  His weight improved. - Continue Marinol 5 mg twice daily.   3.  CVA: - Plavix on hold.  Continue aspirin 81 mg daily.   4.  Non-small cell lung cancer: -  He underwent SBRT of the right upper lobe and left lower lobe lung lesions, completed on 08/21/2020. - She will follow-up with Dr. Isidore Moos for imaging. - 2 nodules in the more inferior right upper lobe on the PET scan on 07/02/2020 will be followed up on subsequent scans.   Orders placed this encounter:  No orders of the defined types were placed in this encounter.    [name], MD [place] [phone]   I, Thana Ates, am acting as a scribe for Dr. [name].  I, [name] MD, have reviewed the above documentation for accuracy and completeness, and I agree with the above.

## 2020-08-22 ENCOUNTER — Other Ambulatory Visit (HOSPITAL_COMMUNITY): Payer: Self-pay | Admitting: Hematology

## 2020-08-23 ENCOUNTER — Encounter (HOSPITAL_COMMUNITY): Payer: Self-pay | Admitting: Hematology

## 2020-08-23 ENCOUNTER — Inpatient Hospital Stay (HOSPITAL_BASED_OUTPATIENT_CLINIC_OR_DEPARTMENT_OTHER): Payer: 59 | Admitting: Hematology

## 2020-08-23 ENCOUNTER — Other Ambulatory Visit: Payer: Self-pay

## 2020-08-23 ENCOUNTER — Other Ambulatory Visit (HOSPITAL_COMMUNITY): Payer: Self-pay | Admitting: *Deleted

## 2020-08-23 VITALS — BP 140/74 | HR 64 | Temp 97.7°F | Resp 16 | Wt 134.2 lb

## 2020-08-23 DIAGNOSIS — C321 Malignant neoplasm of supraglottis: Secondary | ICD-10-CM

## 2020-08-23 DIAGNOSIS — Z8521 Personal history of malignant neoplasm of larynx: Secondary | ICD-10-CM | POA: Diagnosis not present

## 2020-08-23 DIAGNOSIS — C760 Malignant neoplasm of head, face and neck: Secondary | ICD-10-CM | POA: Diagnosis not present

## 2020-08-23 NOTE — Patient Instructions (Addendum)
[place] at [facility] Discharge Instructions  You were seen today by Dr. Delton Coombes. He went over your recent results. You will be scheduled for a CT scan of your neck prior to your next visit. Dr. Delton Coombes will see you back in 5 months for labs and follow up.   Thank you for choosing [place] at [facility] to provide your oncology and hematology care.  To afford each patient quality time with our provider, please arrive at least 15 minutes before your scheduled appointment time.   If you have a lab appointment with the [facility] please come in thru the Main Entrance and check in at the main information desk  You need to re-schedule your appointment should you arrive 10 or more minutes late.  We strive to give you quality time with our providers, and arriving late affects you and other patients whose appointments are after yours.  Also, if you no show three or more times for appointments you may be dismissed from the clinic at the providers discretion.     Again, thank you for choosing [facility].  Our hope is that these requests will decrease the amount of time that you wait before being seen by our physicians.       _____________________________________________________________  Should you have questions after your visit to [facility], please contact our office at [phone] between the hours of 8:00 a.m. and 4:30 p.m.  Voicemails left after 4:00 p.m. will not be returned until the following business day.  For prescription refill requests, have your pharmacy contact our office and allow 72 hours.    Cancer Center Support Programs:   > Cancer Support Group  2nd Tuesday of the month 1pm-2pm, Journey Room

## 2020-08-24 ENCOUNTER — Encounter (HOSPITAL_COMMUNITY): Payer: Self-pay | Admitting: Hematology

## 2020-08-27 ENCOUNTER — Other Ambulatory Visit: Payer: Self-pay

## 2020-08-27 NOTE — Patient Outreach (Signed)
[facility]) Care Management  08/27/2020  [name] November 25, 1960 [id number]  Second telephone outreach attempt to obtain [facility] patient satisfaction survey. No answer. Left message for returned call.  Ina Homes [facility] Management Assistant [phone]

## 2020-09-01 ENCOUNTER — Encounter: Payer: Self-pay | Admitting: Pulmonary Disease

## 2020-09-01 ENCOUNTER — Ambulatory Visit (INDEPENDENT_AMBULATORY_CARE_PROVIDER_SITE_OTHER): Payer: 59 | Admitting: Pulmonary Disease

## 2020-09-01 ENCOUNTER — Other Ambulatory Visit: Payer: Self-pay

## 2020-09-01 VITALS — BP 118/72 | HR 57 | Temp 97.6°F | Ht 72.0 in | Wt 140.6 lb

## 2020-09-01 DIAGNOSIS — C7802 Secondary malignant neoplasm of left lung: Secondary | ICD-10-CM | POA: Diagnosis not present

## 2020-09-01 DIAGNOSIS — R911 Solitary pulmonary nodule: Secondary | ICD-10-CM | POA: Diagnosis not present

## 2020-09-01 DIAGNOSIS — C321 Malignant neoplasm of supraglottis: Secondary | ICD-10-CM | POA: Diagnosis not present

## 2020-09-01 DIAGNOSIS — C3411 Malignant neoplasm of upper lobe, right bronchus or lung: Secondary | ICD-10-CM

## 2020-09-01 NOTE — Progress Notes (Signed)
Synopsis: Referred in June 2022 for abnormal CT chest, history of head and neck cancer, referred by Dr. Isidore Moos, PCP: By [name], MD  Subjective:   PATIENT ID: [name] GENDER: male DOB: 10/14/1960, MRN: [id number]  Chief Complaint  Patient presents with   Follow-up    Pt states following up on biopsy and finished his radiation. Pt states everything been good.    60 year old gentleman, history of stroke history of coronary disease, head and neck cancer.  PEG tube placement managed by Dr. Gertie Fey.  Last seen in the office by Dr. Isidore Moos from radiation oncology on 06/02/2020.  Diagnosed with a supraglottic head and neck cancer, stage IVa, cT2, cN2, cM0.  Patient underwent CT imaging in May 2022 which showed enlargement of a right apical and left lower lobe pulmonary nodule suspicious for metastasis.  Patient was referred here by Dr. Isidore Moos to consider tissue biopsy about next steps.  We talked about various options on the phone at the beginning of June to consider biopsy of these lesions.  Patient is agreeable to this plan.  We did reviewed images today in the office  CT scan of the chest completed on 05/17/2020 revealed interval enlargement of the right apical and left lower lobe pulmonary nodule.  The right apical pulmonary nodule measures 1.1 cm and it was 9 mm.  The left lower lobe nodule is now 6 mm in size was 4 mm.  OV 06/17/2020: Has no significant respiratory complaints today.  We reviewed images today in the office.  OV 09/01/2020: Here today for follow-up after bronchoscopy.  Patient doing well today.  Tolerated procedure well.  No issues.  Has already completed radiation treatments with Dr. Isidore Moos.  Respiratory symptoms are stable.  Denies cough sputum production or hemoptysis.   Past Medical History:  Diagnosis Date   Anemia    Anxiety    Arthritis    Cancer of larynx (HCC)    Carotid artery stenosis    Chronic lower back pain    Coronary artery disease    a. Diag  cath 10/2012 for CP/abnormal nuc -> PCI s/p LAD atherectomy/DES placement 11/05/12.   Depression    Dilated aortic root (HCC)    GERD (gastroesophageal reflux disease)    History of hiatal hernia    Hypercholesteremia    Hypertension    Pneumonia    S/P percutaneous endoscopic gastrostomy (PEG) tube placement ([place])    Stroke ([place]) 07/31/2018   Pons     Family History  Problem Relation Age of Onset   Hypertension Mother    Heart disease Father    Hypertension Father    Heart disease Sister    Hypertension Maternal Grandmother    Hypertension Maternal Grandfather    Hypertension Paternal Grandmother    Hypertension Paternal Grandfather    Pancreatic cancer Maternal Uncle    Breast cancer Maternal Aunt    Breast cancer Maternal Aunt    Diabetes Paternal Uncle    Diabetes Paternal Aunt      Past Surgical History:  Procedure Laterality Date   BIOPSY  11/24/2019   Procedure: BIOPSY;  Surgeon: Mansouraty, Telford Nab., MD;  Location: [place];  Service: Gastroenterology;;   BIOPSY  03/29/2020   Procedure: BIOPSY;  Surgeon: Irving Copas., MD;  Location: [facility];  Service: Gastroenterology;;   BRONCHIAL BIOPSY  07/26/2020   Procedure: BRONCHIAL BIOPSIES;  Surgeon: Garner Nash, DO;  Location: [facility];  Service: Pulmonary;;   BRONCHIAL BRUSHINGS  07/26/2020  Procedure: BRONCHIAL BRUSHINGS;  Surgeon: Garner Nash, DO;  Location: [place];  Service: Pulmonary;;   BRONCHIAL NEEDLE ASPIRATION BIOPSY  07/26/2020   Procedure: BRONCHIAL NEEDLE ASPIRATION BIOPSIES;  Surgeon: Garner Nash, DO;  Location: [place];  Service: Pulmonary;;   CARDIAC CATHETERIZATION  10/29/2012   COLONOSCOPY WITH PROPOFOL N/A 11/24/2019   Procedure: COLONOSCOPY WITH PROPOFOL;  Surgeon: Irving Copas., MD;  Location: [place];  Service: Gastroenterology;  Laterality: N/A;   CORONARY ANGIOPLASTY WITH STENT PLACEMENT  11/05/2012   DIRECT LARYNGOSCOPY N/A  01/02/2019   Procedure: MICRO DIRECT LARYNGOSCOPY BIOPSY OF LARYNGEAL MASS;  Surgeon: [name], MD;  Location: [place];  Service: ENT;  Laterality: N/A;   ESOPHAGOGASTRODUODENOSCOPY (EGD) WITH PROPOFOL N/A 08/11/2019   Procedure: ATTEMPTED ESOPHAGOGASTRODUODENOSCOPY (EGD) WITH PROPOFOL (UNABLE TO PERFORM PERCUTANEOUS ENDOSCOPIC GASTROSTOMY TUBE REPLACEMENT);  Surgeon: [name], MD;  Location: [facility];  Service: Gastroenterology;  Laterality: N/A;   ESOPHAGOGASTRODUODENOSCOPY (EGD) WITH PROPOFOL N/A 11/24/2019   Procedure: ESOPHAGOGASTRODUODENOSCOPY (EGD) WITH PROPOFOL;  Surgeon: Rush Landmark Telford Nab., MD;  Location: [place];  Service: Gastroenterology;  Laterality: N/A;   ESOPHAGOGASTRODUODENOSCOPY (EGD) WITH PROPOFOL N/A 01/26/2020   Procedure: ESOPHAGOGASTRODUODENOSCOPY (EGD) WITH PROPOFOL;  Surgeon: Rush Landmark Telford Nab., MD;  Location: [place];  Service: Gastroenterology;  Laterality: N/A;   ESOPHAGOGASTRODUODENOSCOPY (EGD) WITH PROPOFOL N/A 03/29/2020   Procedure: ESOPHAGOGASTRODUODENOSCOPY (EGD) WITH PROPOFOL;  Surgeon: Rush Landmark Telford Nab., MD;  Location: [facility];  Service: Gastroenterology;  Laterality: N/A;  NEEDS FLOURO   FIDUCIAL MARKER PLACEMENT  07/26/2020   Procedure: FIDUCIAL MARKER PLACEMENT;  Surgeon: Garner Nash, DO;  Location: [facility];  Service: Pulmonary;;   HEMORRHOID SURGERY  ~ 2010   INSERTION OF MESH N/A 06/02/2015   Procedure: INSERTION OF MESH;  Surgeon: [name], MD;  Location: [facility];  Service: General;  Laterality: N/A;   IR ANGIO INTRA EXTRACRAN SEL COM CAROTID INNOMINATE BILAT MOD SED  08/02/2018   IR ANGIO VERTEBRAL SEL VERTEBRAL BILAT MOD SED  08/02/2018   IR REPLACE G-TUBE SIMPLE WO FLUORO  08/12/2019   IR [place] GASTRO/COLONIC TUBE PERCUT W/FLUORO  08/13/2019   LAPAROSCOPIC INSERTION GASTROSTOMY TUBE N/A 02/24/2019   Procedure: LAPAROSCOPIC INSERTION GASTROSTOMY TUBE;  Surgeon: [name], MD;  Location: [place];  Service: General;   Laterality: N/A;   LEFT HEART CATHETERIZATION WITH CORONARY ANGIOGRAM N/A 10/30/2012   Procedure: LEFT HEART CATHETERIZATION WITH CORONARY ANGIOGRAM;  Surgeon: [name], MD;  Location: [facility];  Service: Cardiovascular;  Laterality: N/A;   MULTIPLE EXTRACTIONS WITH ALVEOLOPLASTY N/A 02/18/2019   Procedure: Extraction of tooth #'s 5-7, 10,11,14,20-29, 31 and 32 with alveoloplasty and maxiillary left buccal exostoses reductions;  Surgeon: [name], [name];  Location: [place];  Service: Oral Surgery;  Laterality: N/A;   PEG PLACEMENT N/A 01/26/2020   Procedure: PERCUTANEOUS ENDOSCOPIC GASTROSTOMY (PEG) REPLACEMENT;  Surgeon: Irving Copas., MD;  Location: [place];  Service: Gastroenterology;  Laterality: N/A;   PERCUTANEOUS CORONARY ROTOBLATOR INTERVENTION (PCI-R) N/A 11/05/2012   Procedure: PERCUTANEOUS CORONARY ROTOBLATOR INTERVENTION (PCI-R);  Surgeon: [name], MD;  Location: [facility];  Service: Cardiovascular;  Laterality: N/A;   SAVORY DILATION N/A 11/24/2019   Procedure: SAVORY DILATION;  Surgeon: Rush Landmark Telford Nab., MD;  Location: [place];  Service: Gastroenterology;  Laterality: N/A;   SAVORY DILATION N/A 01/26/2020   Procedure: SAVORY DILATION;  Surgeon: Rush Landmark Telford Nab., MD;  Location: [place];  Service: Gastroenterology;  Laterality: N/A;   TRACHEOSTOMY TUBE PLACEMENT N/A 02/18/2019  Procedure: Tracheostomy;  Surgeon: [name], MD;  Location[facility];  Service: ENT;  Laterality: N/A;   UMBILICAL HERNIA REPAIR N/A 06/02/2015   Procedure: UMBILICAL HERNIORRHAPHY WITH MESH;  Surgeon: [name], MD;  Location[facility];  Service: General;  Laterality: N/A;   VIDEO BRONCHOSCOPY WITH ENDOBRONCHIAL NAVIGATION Bilateral 07/26/2020   Procedure: VIDEO BRONCHOSCOPY WITH ENDOBRONCHIAL NAVIGATION;  Surgeon: Garner Nash, DO;  Location: [place];  Service: Pulmonary;  Laterality: Bilateral;  ION   VIDEO BRONCHOSCOPY WITH RADIAL  ENDOBRONCHIAL ULTRASOUND  07/26/2020   Procedure: VIDEO BRONCHOSCOPY WITH RADIAL ENDOBRONCHIAL ULTRASOUND;  Surgeon: Garner Nash, DO;  Location: [place];  Service: Pulmonary;;    Social History   Socioeconomic History   Marital status: Married    Spouse name: [name]   Number of children: 0   Years of education: Not on file   Highest education level: Not on file  Occupational History   Occupation: Disabled    Comment: previously worked in Nurse, learning disability as an Clinical biochemist  Tobacco Use   Smoking status: Former    Packs/day: 3.00    Years: 32.00    Pack years: 96.00    Types: Cigarettes    Start date: 08/29/1977    Quit date: 11/29/2006    Years since quitting: 13.7   Smokeless tobacco: Former    Types: Chew    Quit date: 2003   Tobacco comments:    11/05/2012 "chewed tobacco when I play ball; haven't chewed since age 30"  Vaping Use   Vaping Use: Never used  Substance and Sexual Activity   Alcohol use: Not Currently    Alcohol/week: 12.0 standard drinks    Types: 12 Cans of beer per week    Comment: per wife [name] 6-7/week now as of 02/24/19   Drug use: Not Currently    Types: Marijuana    Comment: Last use was on - denies on 04/08/19   Sexual activity: Not on file  Other Topics Concern   Not on file  Social History Narrative   Lives with wife at home   Right Handed   Drinks 5-6 cups caffeine daily   Social Determinants of Health   Financial Resource Strain: Not on file  Food Insecurity: Not on file  Transportation Needs: Not on file  Physical Activity: Not on file  Stress: Not on file  Social Connections: Not on file  Intimate Partner Violence: Not on file     Allergies  Allergen Reactions   Cymbalta [Duloxetine Hcl]    Duloxetine Other (See Comments)    Caused depression/ patient is taking   Prednisone Other (See Comments)    Chest pain     Outpatient Medications Prior to Visit  Medication Sig Dispense Refill   aspirin EC 81 MG tablet  Take 1 tablet (81 mg total) by mouth daily with breakfast. 30 tablet 4   atorvastatin (LIPITOR) 40 MG tablet Take 80 mg by mouth at bedtime.     diazepam (VALIUM) 10 MG tablet Take 10 mg by mouth 3 (three) times daily as needed.     DULoxetine (CYMBALTA) 60 MG capsule Take 60 mg by mouth daily.     escitalopram (LEXAPRO) 10 MG tablet Take 1 tablet (10 mg total) by mouth daily. 30 tablet 1   HYDROcodone-acetaminophen (NORCO) 10-325 MG tablet Take 1 tablet by mouth every 4 (four) hours as needed. 50 tablet 0   LINZESS 290 MCG CAPS capsule Take 290 mcg by mouth daily.  metoprolol tartrate (LOPRESSOR) 25 MG tablet Take 12.5 mg by mouth 2 (two) times daily.     mirtazapine (REMERON) 7.5 MG tablet Take 1 tablet (7.5 mg total) by mouth at bedtime. 30 tablet 2   nitroGLYCERIN (NITROSTAT) 0.4 MG SL tablet Place 1 tablet (0.4 mg total) under the tongue every 5 (five) minutes as needed for chest pain (CP or SOB). 25 tablet 3   omeprazole (PRILOSEC) 20 MG capsule Take 20 mg by mouth every morning.     potassium chloride SA (KLOR-CON) 20 MEQ tablet Take 20 mEq by mouth daily.     prochlorperazine (COMPAZINE) 10 MG tablet TAKE 1 TABLET(10 MG) BY MOUTH EVERY 6 HOURS AS NEEDED FOR NAUSEA 60 tablet 2   valproic acid (DEPAKENE) 250 MG/5ML solution Take 10 mLs (500 mg total) by mouth 3 (three) times daily. 900 mL 3   acetaminophen (TYLENOL) 325 MG tablet Take 1.5 tablets (487.5 mg total) by mouth every 6 (six) hours as needed for mild pain, fever or headache (or Fever >/= 101). (Patient not taking: No sig reported)     dronabinol (MARINOL) 5 MG capsule TAKE 1 CAPSULE(5 MG) BY MOUTH TWICE DAILY BEFORE A MEAL (Patient not taking: Reported on 09/01/2020) 60 capsule 3   lactulose (CHRONULAC) 10 GM/15ML solution Take 30 mLs by mouth 4 (four) times daily as needed. (Patient not taking: Reported on 09/01/2020)     famotidine (PEPCID) 20 MG tablet Take 20 mg by mouth daily.     No facility-administered medications prior to  visit.    Review of Systems  Constitutional:  Positive for malaise/fatigue and weight loss. Negative for chills and fever.  HENT:  Negative for hearing loss, sore throat and tinnitus.   Eyes:  Negative for blurred vision and double vision.  Respiratory:  Positive for shortness of breath. Negative for cough, hemoptysis, sputum production, wheezing and stridor.   Cardiovascular:  Negative for chest pain, palpitations, orthopnea, leg swelling and PND.  Gastrointestinal:  Negative for abdominal pain, constipation, diarrhea, heartburn, nausea and vomiting.  Genitourinary:  Negative for dysuria, hematuria and urgency.  Musculoskeletal:  Negative for joint pain and myalgias.  Skin:  Negative for itching and rash.  Neurological:  Negative for dizziness, tingling, weakness and headaches.  Endo/Heme/Allergies:  Negative for environmental allergies. Does not bruise/bleed easily.  Psychiatric/Behavioral:  Negative for depression. The patient is not nervous/anxious and does not have insomnia.   All other systems reviewed and are negative.   Objective:  Physical Exam Vitals reviewed.  Constitutional:      General: He is not in acute distress.    Appearance: He is well-developed.  HENT:     Head: Normocephalic and atraumatic.     Mouth/Throat:     Pharynx: No oropharyngeal exudate.  Eyes:     Conjunctiva/sclera: Conjunctivae normal.     Pupils: Pupils are equal, round, and reactive to light.  Neck:     Vascular: No JVD.     Trachea: No tracheal deviation.     Comments: Loss of supraclavicular fat Cardiovascular:     Rate and Rhythm: Normal rate and regular rhythm.     Heart sounds: S1 normal and S2 normal.     Comments: Distant heart tones Pulmonary:     Effort: No tachypnea or accessory muscle usage.     Breath sounds: No stridor. Decreased breath sounds (throughout all lung fields) present. No wheezing, rhonchi or rales.  Abdominal:     General: Bowel sounds are normal. There is  no  distension.     Palpations: Abdomen is soft.     Tenderness: There is no abdominal tenderness.  Musculoskeletal:        General: Deformity (muscle wasting ) present.  Skin:    General: Skin is warm and dry.     Capillary Refill: Capillary refill takes less than 2 seconds.     Findings: No rash.  Neurological:     Mental Status: He is alert and oriented to person, place, and time.  Psychiatric:        Behavior: Behavior normal.     Vitals:   09/01/20 1647  BP: 118/72  Pulse: (!) 57  Temp: 97.6 F (36.4 C)  TempSrc: Oral  SpO2: 92%  Weight: 140 lb 9.6 oz (63.8 kg)  Height: 6' (1.829 m)   92% on RA BMI Readings from Last 3 Encounters:  09/01/20 19.07 kg/m  08/23/20 18.20 kg/m  07/30/20 17.65 kg/m   Wt Readings from Last 3 Encounters:  09/01/20 140 lb 9.6 oz (63.8 kg)  08/23/20 134 lb 3.2 oz (60.9 kg)  07/30/20 130 lb 2 oz (59 kg)     CBC    Component Value Date/Time   WBC 4.0 08/17/2020 1237   RBC 3.65 (L) 08/17/2020 1237   HGB 11.6 (L) 08/17/2020 1237   HCT 36.0 (L) 08/17/2020 1237   HCT 25.0 (L) 04/02/2019 0437   PLT 176 08/17/2020 1237   MCV 98.6 08/17/2020 1237   MCH 31.8 08/17/2020 1237   MCHC 32.2 08/17/2020 1237   RDW 12.8 08/17/2020 1237   LYMPHSABS 0.9 08/17/2020 1237   MONOABS 0.6 08/17/2020 1237   EOSABS 0.3 08/17/2020 1237   BASOSABS 0.0 08/17/2020 1237     Chest Imaging:  05/17/2020: CT chest, enlarging right upper lobe and left lower lobe pulmonary nodule in the setting of a known history of head neck cancer concerning for metastasis. Imaging reviewed with patient today in the office. The patient's images have been independently reviewed by me.    Pulmonary Functions Testing Results: PFT Results Latest Ref Rng & Units 07/30/2020  FVC-Pre L 3.79  FVC-Predicted Pre % 73  FVC-Post L 3.95  FVC-Predicted Post % 76  Pre FEV1/FVC % % 49  Post FEV1/FCV % % 41  FEV1-Pre L 1.87  FEV1-Predicted Pre % 47  FEV1-Post L 1.64  DLCO uncorrected  ml/min/mmHg 12.85  DLCO UNC% % 43  DLVA Predicted % 56  TLC L 6.91  TLC % Predicted % 93  RV % Predicted % 157    FeNO:   Pathology:   Echocardiogram:   Heart Catheterization:     Assessment & Plan:     ICD-10-CM   1. Malignant neoplasm of upper lobe of right lung (HCC)  C34.11     2. Right upper lobe pulmonary nodule  R91.1     3. Left lower lobe pulmonary nodule  R91.1     4. Malignant neoplasm of supraglottis (HCC)  C32.1     5. 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  Patient was taken for robotic assisted navigational bronchoscopy with a biopsy of the new right upper lobe pulmonary nodule and left lower lobe pulmonary nodule.  Pathology reviewed today with patient consistent with malignancy in the lung.  Plan: Patient has received additional radiation to these areas. From respiratory  standpoint he is doing okay.  He is able to complete most of his ADLs.  He does feel unsteady at times and encouraged him to continue to use a walking aid or wheelchair when out in public or on unfamiliar ground. Patient is agreeable to this plan. He is on a follow-up with [place] in 1 year or as needed. His additional surveillance CT imaging and/or PET scans in the future will be ordered by radiation oncology. We appreciate the consultation and the opportunity to care for this patient.  Please do not hesitate to reach out there is any questions or concerns.    Current Outpatient Medications:    aspirin EC 81 MG tablet, Take 1 tablet (81 mg total) by mouth daily with breakfast., Disp: 30 tablet, Rfl: 4   atorvastatin (LIPITOR) 40 MG tablet, Take 80 mg by mouth at bedtime., Disp: , Rfl:    diazepam (VALIUM) 10 MG tablet, Take 10 mg by mouth 3 (three) times daily as needed., Disp: , Rfl:    DULoxetine (CYMBALTA) 60 MG  capsule, Take 60 mg by mouth daily., Disp: , Rfl:    escitalopram (LEXAPRO) 10 MG tablet, Take 1 tablet (10 mg total) by mouth daily., Disp: 30 tablet, Rfl: 1   HYDROcodone-acetaminophen (NORCO) 10-325 MG tablet, Take 1 tablet by mouth every 4 (four) hours as needed., Disp: 50 tablet, Rfl: 0   LINZESS 290 MCG CAPS capsule, Take 290 mcg by mouth daily., Disp: , Rfl:    metoprolol tartrate (LOPRESSOR) 25 MG tablet, Take 12.5 mg by mouth 2 (two) times daily., Disp: , Rfl:    mirtazapine (REMERON) 7.5 MG tablet, Take 1 tablet (7.5 mg total) by mouth at bedtime., Disp: 30 tablet, Rfl: 2   nitroGLYCERIN (NITROSTAT) 0.4 MG SL tablet, Place 1 tablet (0.4 mg total) under the tongue every 5 (five) minutes as needed for chest pain (CP or SOB)., Disp: 25 tablet, Rfl: 3   omeprazole (PRILOSEC) 20 MG capsule, Take 20 mg by mouth every morning., Disp: , Rfl:    potassium chloride SA (KLOR-CON) 20 MEQ tablet, Take 20 mEq by mouth daily., Disp: , Rfl:    prochlorperazine (COMPAZINE) 10 MG tablet, TAKE 1 TABLET(10 MG) BY MOUTH EVERY 6 HOURS AS NEEDED FOR NAUSEA, Disp: 60 tablet, Rfl: 2   valproic acid (DEPAKENE) 250 MG/5ML solution, Take 10 mLs (500 mg total) by mouth 3 (three) times daily., Disp: 900 mL, Rfl: 3   acetaminophen (TYLENOL) 325 MG tablet, Take 1.5 tablets (487.5 mg total) by mouth every 6 (six) hours as needed for mild pain, fever or headache (or Fever >/= 101). (Patient not taking: No sig reported), Disp: , Rfl:    dronabinol (MARINOL) 5 MG capsule, TAKE 1 CAPSULE(5 MG) BY MOUTH TWICE DAILY BEFORE A MEAL (Patient not taking: Reported on 09/01/2020), Disp: 60 capsule, Rfl: 3   lactulose (CHRONULAC) 10 GM/15ML solution, Take 30 mLs by mouth 4 (four) times daily as needed. (Patient not taking: Reported on 09/01/2020), Disp: , Rfl:     Garner Nash, DO [facility] 09/01/2020 5:48 PM

## 2020-09-01 NOTE — Patient Instructions (Signed)
Thank you for visiting Dr. Valeta Harms at [facility]. Today we recommend the following:  Return in about 1 year (around 09/01/2021), or if symptoms worsen or fail to improve, for with APP or Dr. Valeta Harms.    Please do your part to reduce the spread of COVID-19.

## 2020-09-08 ENCOUNTER — Telehealth: Payer: Self-pay | Admitting: Nurse Practitioner

## 2020-09-08 NOTE — Telephone Encounter (Signed)
I attempted to call [name] to schedule f/u pc visit, no answer, message left with contact information to return call

## 2020-09-10 ENCOUNTER — Encounter (HOSPITAL_COMMUNITY): Payer: Self-pay | Admitting: Hematology

## 2020-09-10 NOTE — Progress Notes (Signed)
                                                                                                                                                             Patient Name: [name] MRN: [id number] DOB: 1960-07-12 Referring Physician: [name] Date of Service: 08/20/2020 [facility], [place]                                                        End Of Treatment Note  Diagnoses: C34.11-Malignant neoplasm of upper lobe, right bronchus or lung  Cancer Staging: Cancer Staging Malignant neoplasm of supraglottis ([place]) Staging form: Larynx - Supraglottis, AJCC 8th Edition - Clinical: Stage IVA (cT2, cN2c, cM0) - Signed by [name], MD on 01/30/2019  Now with possible metastatic disease to lungs vs new primaries, favor oligometastases  Intent: Curative  Radiation Treatment Dates: 08/10/2020 through 08/20/2020 Site Technique Total Dose (Gy) Dose per Fx (Gy) Completed Fx Beam Energies  Lung, Right: Lung_Rt_upper IMRT 50/50 10 5/5 6XFFF  Lung, Left: Lung_Lt_lower IMRT 50/50 10 5/5 6XFFF   Narrative: The patient tolerated radiation therapy relatively well.   Plan: The patient will follow-up with radiation oncology in 1 mo. -----------------------------------  Eppie Gibson, MD

## 2020-09-16 ENCOUNTER — Other Ambulatory Visit (HOSPITAL_COMMUNITY): Payer: Self-pay | Admitting: Interventional Radiology

## 2020-09-16 DIAGNOSIS — I639 Cerebral infarction, unspecified: Secondary | ICD-10-CM

## 2020-09-17 ENCOUNTER — Other Ambulatory Visit: Payer: Medicaid Other | Admitting: Nurse Practitioner

## 2020-09-17 ENCOUNTER — Encounter: Payer: Self-pay | Admitting: Nurse Practitioner

## 2020-09-17 ENCOUNTER — Other Ambulatory Visit: Payer: Self-pay

## 2020-09-17 DIAGNOSIS — R0602 Shortness of breath: Secondary | ICD-10-CM

## 2020-09-17 DIAGNOSIS — R531 Weakness: Secondary | ICD-10-CM

## 2020-09-17 DIAGNOSIS — Z515 Encounter for palliative care: Secondary | ICD-10-CM

## 2020-09-17 NOTE — Progress Notes (Signed)
[facility] Consult Note Telephone: [phone]  Fax: [phone]    Date of encounter: 09/17/20 5:09 PM PATIENT NAME: [name] [address] [address]   [phone] (home)  DOB: 1960/09/12 MRN: [id number] PRIMARY CARE PROVIDER:    [name], MD,  [address] [place] [address] [phone]  RESPONSIBLE PARTY:    Contact Information     Name Relation Home Work [place], [place] Spouse   [place] Daughter   [phone]   [place], [place] Father [phone]  [phone]      Due to the COVID-19 crisis, this visit was done via telemedicine from my office and it was initiated and consent by this patient and or family.  I connected with  ISIDOR BROMELL on 09/17/20 by a video enabled telemedicine application and verified that I am speaking with the correct person.   I discussed the limitations of evaluation and management by telemedicine. The patient expressed understanding and agreed to proceed. Palliative Care was asked to follow this patient by consultation request of  [name], MD to address advance care planning and complex medical decision making. This is a follow up visit.                                   ASSESSMENT AND PLAN / RECOMMENDATIONS: Symptom Management/Plan: ACP: Full code 2. Shortness of breath, secondary to pulmonary nodule  metastasis/supraglottic head and neck cancer, stage IVa, cT2, cN2, cM, continue to monitor respiratory status, continue current regimen 3. Debility/generalized weakness secondary to pulmonary nodule  metastasis/supraglottic head and neck cancer, stage IVa, cT2, cN2, cM. Energy conservation, allow times of rest, mobility, generalized weakness discussed.  4. Goals of Care: Goals include to maximize quality of life and symptom management. Our advance care planning conversation included a discussion about:    The value and importance of  advance care planning  Exploration of personal, cultural or spiritual beliefs that might influence medical decisions  Exploration of goals of care in the event of a sudden injury or illness  Identification and preparation of a healthcare agent  Review and updating or creation of an advance directive document. 5. Palliative care encounter; Palliative care encounter; Palliative medicine team will continue to support patient, patient's family, and medical team. Visit consisted of counseling and education dealing with the complex and emotionally intense issues of symptom management and palliative care in the setting of serious and potentially life-threatening illness  Follow up Palliative Care Visit: Palliative care will continue to follow for complex medical decision making, advance care planning, and clarification of goals. Return 8 weeks or prn.  I spent 43 minutes providing this consultation. More than 50% of the time in this consultation was spent in counseling and care coordination  PPS: 50%  Chief Complaint: Follow up palliative consult for complex medical decision making  HISTORY OF PRESENT ILLNESS:  [name] is a 60 y.o. year old male  with multiple medical problem including history of stroke history of coronary disease, head and neck cancer.  PEG tube placement managed by Dr. Gertie Fey.  Last seen in the office by Dr. Isidore Moos from radiation oncology on 06/02/2020.  Diagnosed with a supraglottic head and neck cancer, stage IVa, cT2, cN2, cM0.  Patient underwent CT imaging in May 2022 which showed enlargement of a right apical and left lower lobe pulmonary  nodule suspicious for metastasis, chronic low back pain, CAD s/p stenting in 2014, hx of CVA. CT scan of the chest completed on 05/17/2020 revealed interval enlargement of the right apical and left lower lobe pulmonary nodule.  The right apical pulmonary nodule measures 1.1 cm and it was 9 mm.  The left lower lobe nodule is now 6 mm in size  was 4 mm. Pathology consistent with malignancy in the lung, completed radiation treatments. I called mr. Holsapple for telemedicine telephonic as video not available f/u pc visit. We talked about purpose of pc visit. Mr. Leaf in agreement. We talked about recent Oncology visit with completion radiation treatments. We talked about his daily functioning, no recent falls. We talked about his ADL's as he does most of them independently. We talked about symptoms of pain, shortness of breath which he currently denies. We talked about his appetite which has been good, no recent weight loss or change in clothes size. We talked about medical goals of care. We talked about how he feels about his cancer, quality of life. We talked about role pc in poc. We talked about f/u pc visit, Mr. Coldwell in agreement, scheduled. Therapeutic listening, emotional support provided, questions answered.   1. Malignant neoplasm of upper lobe of right lung (HCC)  C34.11       2. Right upper lobe pulmonary nodule  R91.1       3. Left lower lobe pulmonary nodule  R91.1       4. Malignant neoplasm of supraglottis (HCC)  C32.1       5. 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 Panepinto.  I reviewed available labs, medications, imaging, studies and related documents from the EMR.  Records reviewed and summarized above.   ROS Full 14 system review of systems performed and negative with exception of: as per HPI.   Physical Exam: deferred Questions and concerns were addressed. The patient/wife was encouraged to call with questions and/or concerns. My contact information was provided. Provided general support and encouragement, no other unmet needs identified   Thank you for the opportunity to participate in the care of Mr. Riecke.  The palliative care team will continue to follow. Please call our office at [phone] if we can be of additional  assistance.   This chart was dictated using voice recognition software.  Despite best efforts to proofread,  errors can occur which can change the documentation meaning.   Symone Cornman Ihor Gully, NP

## 2020-09-22 ENCOUNTER — Ambulatory Visit
Admission: RE | Admit: 2020-09-22 | Discharge: 2020-09-22 | Disposition: A | Discharge status: Home / Self Care | Payer: 59 | Source: Ambulatory Visit | Attending: Radiation Oncology | Admitting: Radiation Oncology

## 2020-09-22 ENCOUNTER — Encounter (HOSPITAL_COMMUNITY): Payer: Self-pay | Admitting: Hematology

## 2020-09-22 ENCOUNTER — Other Ambulatory Visit: Payer: Self-pay

## 2020-09-22 VITALS — BP 120/79 | HR 69 | Temp 97.8°F | Resp 20 | Ht 72.0 in | Wt 145.0 lb

## 2020-09-22 DIAGNOSIS — C321 Malignant neoplasm of supraglottis: Secondary | ICD-10-CM

## 2020-09-22 DIAGNOSIS — C3411 Malignant neoplasm of upper lobe, right bronchus or lung: Secondary | ICD-10-CM | POA: Diagnosis present

## 2020-09-22 DIAGNOSIS — Z923 Personal history of irradiation: Secondary | ICD-10-CM | POA: Diagnosis not present

## 2020-09-22 DIAGNOSIS — R946 Abnormal results of thyroid function studies: Secondary | ICD-10-CM | POA: Insufficient documentation

## 2020-09-22 DIAGNOSIS — Z79899 Other long term (current) drug therapy: Secondary | ICD-10-CM | POA: Insufficient documentation

## 2020-09-22 DIAGNOSIS — C7802 Secondary malignant neoplasm of left lung: Secondary | ICD-10-CM

## 2020-09-22 DIAGNOSIS — Z7982 Long term (current) use of aspirin: Secondary | ICD-10-CM | POA: Diagnosis not present

## 2020-09-22 NOTE — Progress Notes (Signed)
[name] presents today for follow-up after completing SBRT treatment to his right upper lung and left lower lung on 08/20/2020 (also completed radiation to his larynx on 05/08/2019)  Denies any new or worsening respiratory concerns. Reports lingering fatigue during the day (manages with naps), which impacts his ability to sleep through the night. Denies any new areas of pain or discomfort.  Reports a stable appetite Wt Readings from Last 3 Encounters:  09/22/20 145 lb (65.8 kg)  09/01/20 140 lb 9.6 oz (63.8 kg)  08/23/20 134 lb 3.2 oz (60.9 kg)  Does report lingering itching/irritation to his neck where previous radiation was delivered. Had follow-up with his pulmonologist at the end of August. Also had a Palliative care virtual visit/assessment on 09/17/2020. Overall, he reports he feels better than he has in the past, thought he doesn't feel he has completely returned to his baseline prior to starting all his oncology treatments  Vitals:   09/22/20 1143  BP: 120/79  Pulse: 69  Resp: 20  Temp: 97.8 F (36.6 C)  SpO2: 97%

## 2020-09-24 ENCOUNTER — Encounter: Payer: Self-pay | Admitting: Radiation Oncology

## 2020-09-24 NOTE — Progress Notes (Signed)
Radiation Oncology         [phone] ________________________________  Name: [name] MRN: [id number]  Date: 09/22/2020  DOB: May 29, 1960  Follow-Up Visit Note  Outpatient  CC: [name], MD  [name], MD  Diagnosis and Prior Radiotherapy:    ICD-10-CM   1. Malignant neoplasm of upper lobe of right lung ([facility])  C34.11     2. 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 Malignant neoplasm of supraglottis ([facility])  C32.1       Radiation Treatment Dates: 08/10/2020 through 08/20/2020 Site Technique Total Dose (Gy) Dose per Fx (Gy) Completed Fx Beam Energies  Lung, Right: Lung_Rt_upper IMRT 50/50 10 5/5 6XFFF  Lung, Left: Lung_Lt_lower IMRT 50/50 10 5/5 6XFFF   Radiation Treatment Dates: 03/05/2019 through 05/08/2019 Site Technique Total Dose (Gy) Dose per Fx (Gy) Completed Fx Beam Energies  Larynx: HN_larynx IMRT 70/70 2 35/35 6X   CHIEF COMPLAINT: Here for follow-up and surveillance of lung and throat cancer  Narrative:  The patient returns today for routine follow-up.  [name] presents today for follow-up after completing SBRT treatment to his right upper lung and left lower lung on 08/20/2020 (also completed radiation to his larynx on 05/08/2019)  Denies any new or worsening respiratory concerns. Reports lingering fatigue during the day (manages with naps), which impacts his ability to sleep through the night. Denies any new areas of pain or discomfort.  Reports a stable appetite Wt Readings from Last 3 Encounters:  09/22/20 145 lb (65.8 kg)  09/01/20 140 lb 9.6 oz (63.8 kg)  08/23/20 134 lb 3.2 oz (60.9 kg)  Does report lingering itching/irritation to his neck where previous radiation was delivered. Had follow-up with his pulmonologist at the end of August. Also had a Palliative care virtual visit/assessment on 09/17/2020. Overall, he reports he feels better than he has in the past, thought he doesn't feel he has completely returned  to his baseline prior to starting all his oncology treatments    He continues to follow with otolaryngology for physical exams/laryngoscopy as well as with medical oncology  ALLERGIES:  is allergic to cymbalta [duloxetine hcl], duloxetine, and prednisone.  Meds: Current Outpatient Medications  Medication Sig Dispense Refill   acetaminophen (TYLENOL) 325 MG tablet Take 1.5 tablets (487.5 mg total) by mouth every 6 (six) hours as needed for mild pain, fever or headache (or Fever >/= 101). (Patient not taking: No sig reported)     aspirin EC 81 MG tablet Take 1 tablet (81 mg total) by mouth daily with breakfast. 30 tablet 4   atorvastatin (LIPITOR) 40 MG tablet Take 80 mg by mouth at bedtime.     diazepam (VALIUM) 10 MG tablet Take 10 mg by mouth 3 (three) times daily as needed.     dronabinol (MARINOL) 5 MG capsule TAKE 1 CAPSULE(5 MG) BY MOUTH TWICE DAILY BEFORE A MEAL (Patient not taking: Reported on 09/01/2020) 60 capsule 3   DULoxetine (CYMBALTA) 60 MG capsule Take 60 mg by mouth daily.     escitalopram (LEXAPRO) 10 MG tablet Take 1 tablet (10 mg total) by mouth daily. 30 tablet 1   HYDROcodone-acetaminophen (NORCO) 10-325 MG tablet Take 1 tablet by mouth every 4 (four) hours as needed. 50 tablet 0   lactulose (CHRONULAC) 10 GM/15ML solution Take 30 mLs by mouth 4 (four) times daily as needed. (Patient not taking: Reported on 09/01/2020)     LINZESS 290 MCG CAPS capsule Take  290 mcg by mouth daily.     metoprolol tartrate (LOPRESSOR) 25 MG tablet Take 12.5 mg by mouth 2 (two) times daily.     mirtazapine (REMERON) 7.5 MG tablet Take 1 tablet (7.5 mg total) by mouth at bedtime. 30 tablet 2   nitroGLYCERIN (NITROSTAT) 0.4 MG SL tablet Place 1 tablet (0.4 mg total) under the tongue every 5 (five) minutes as needed for chest pain (CP or SOB). 25 tablet 3   omeprazole (PRILOSEC) 20 MG capsule Take 20 mg by mouth every morning.     potassium chloride SA (KLOR-CON) 20 MEQ tablet Take 20 mEq by mouth  daily.     prochlorperazine (COMPAZINE) 10 MG tablet TAKE 1 TABLET(10 MG) BY MOUTH EVERY 6 HOURS AS NEEDED FOR NAUSEA 60 tablet 2   valproic acid (DEPAKENE) 250 MG/5ML solution Take 10 mLs (500 mg total) by mouth 3 (three) times daily. 900 mL 3   No current facility-administered medications for this encounter.    Physical Findings: The patient is in no acute distress. Patient is alert and oriented.  height is 6' (1.829 m) and weight is 145 lb (65.8 kg). His temperature is 97.8 F (36.6 C). His blood pressure is 120/79 and his pulse is 69. His respiration is 20 and oxygen saturation is 97%. Marland Kitchen    HEENT: No lesions in mouth or upper throat Neck: There is some mild lymphedema in the right neck.  No palpable lymphadenopathy in the bilateral neck Heart is regular in rate and rhythm Chest is clear to auscultation bilaterally  Lab Findings: Lab Results  Component Value Date   WBC 4.0 08/17/2020   HGB 11.6 (L) 08/17/2020   HCT 36.0 (L) 08/17/2020   MCV 98.6 08/17/2020   PLT 176 08/17/2020   Lab Results  Component Value Date   TSH 5.489 (H) 08/17/2020    Radiographic Findings: No results found.  Impression/Plan: He is recovering well from SBRT to the chest.  He is scheduled for CT scan of the neck with medical oncology in January.  We will need to get a CT scan of his chest for surveillance in November.  I will consolidate that with a CT scan of the neck with contrast.  We will cancel the CT for January.  Medical oncology informed.  Patient is pleased with this plan.  I will see him back in November after his scans.  Thyroid function: This is being monitored by medical oncology, slightly elevated TSH noted from August.  On date of service, in total, I spent 20 minutes on this encounter. Patient was seen in person.  _____________________________________   Eppie Gibson, MD

## 2020-09-30 ENCOUNTER — Ambulatory Visit (HOSPITAL_COMMUNITY)
Admission: RE | Admit: 2020-09-30 | Discharge: 2020-09-30 | Disposition: A | Discharge status: Home / Self Care | Payer: 59 | Source: Ambulatory Visit | Attending: Interventional Radiology | Admitting: Interventional Radiology

## 2020-09-30 ENCOUNTER — Other Ambulatory Visit: Payer: Self-pay

## 2020-09-30 DIAGNOSIS — I639 Cerebral infarction, unspecified: Secondary | ICD-10-CM | POA: Insufficient documentation

## 2020-09-30 NOTE — Progress Notes (Signed)
VASCULAR LAB    Carotid duplex has been performed.  See CV proc for preliminary results.   Roxy Mastandrea, RVT 09/30/2020, 2:38 PM

## 2020-10-06 ENCOUNTER — Ambulatory Visit: Payer: 59 | Admitting: Adult Health

## 2020-10-06 ENCOUNTER — Other Ambulatory Visit: Payer: Self-pay

## 2020-10-06 ENCOUNTER — Encounter: Payer: Self-pay | Admitting: Adult Health

## 2020-10-06 VITALS — BP 116/74 | HR 60 | Ht 72.0 in | Wt 145.0 lb

## 2020-10-06 DIAGNOSIS — R4020 Unspecified coma: Secondary | ICD-10-CM | POA: Diagnosis not present

## 2020-10-06 DIAGNOSIS — I739 Peripheral vascular disease, unspecified: Secondary | ICD-10-CM | POA: Diagnosis not present

## 2020-10-06 DIAGNOSIS — Z5181 Encounter for therapeutic drug level monitoring: Secondary | ICD-10-CM

## 2020-10-06 DIAGNOSIS — Z8673 Personal history of transient ischemic attack (TIA), and cerebral infarction without residual deficits: Secondary | ICD-10-CM | POA: Diagnosis not present

## 2020-10-06 DIAGNOSIS — E785 Hyperlipidemia, unspecified: Secondary | ICD-10-CM | POA: Diagnosis not present

## 2020-10-06 NOTE — Patient Instructions (Signed)
Referral placed to vascular surgery - you should be called to schedule initial evaluation  Continue aspirin 81 mg daily  and atorvastatin  for secondary stroke prevention  Continue to follow up with PCP regarding cholesterol and blood pressure management  Maintain strict control of hypertension with blood pressure goal below 130/90 and cholesterol with LDL cholesterol (bad cholesterol) goal below 70 mg/dL.   Check lab work today  Continue depakote 500mg  three times daily    Followup in the future with me in 6 months or call earlier if needed       Thank you for coming to see [place] at [facility]. I hope we have been able to provide you high quality care today.  You may receive a patient satisfaction survey over the next few weeks. We would appreciate your feedback and comments so that we may continue to improve ourselves and the health of our patients.

## 2020-10-06 NOTE — Progress Notes (Signed)
[facility] [address] [place]. [place] [address] [phone]       OFFICE FOLLOW UP NOTE  [name] Date of Birth:  December 28, 1960 Medical Record Number:  [id number]   Referring MD:  [name]  Reason for Referral:  Syncope versus seizure   Chief Complaint  Patient presents with   Follow-up    RM 2 With spouse [name]  Pt is well, no seizures, loss of consciousness or new stroke symptoms since last visit.       HPI:    Update 10/06/2020 JM: Returns for 59-month follow-up accompanied by his wife, [name].  Overall doing well from a neurological standpoint.  Remains on Depakote without any additional seizure activity or loss of consciousness and tolerating without side effects.  Denies new or reoccurring stroke/TIA symptoms.  Compliant on aspirin and atorvastatin without side effects.  Blood pressure today 116/74.  Recent carotid ultrasound showed R ICA 60-79% stenosis and L ICA 1-39% routinely monitored by Dr. Estanislado [name].  Wife is concerned regarding LE color changes (turns purpose per wife) after sitting for any length of time. Recently stubbed his toe on the side of the bed - wife concerned regarding possible infection. Unable to be seen by PCP until 10/17 which is a VV but wife concerned he needs to be seen before then.  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  Routinely followed by oncology and pulmonology.  No further concerns at this time.    History provided for reference purposes only Update 07/21/2020 JM: [name] returns for 59-month follow-up accompanied by his wife, [name], after prior consult visit with Dr. Leonie [name] for evaluation of loss of consciousness episodes possibly in setting syncope vs seizures.  MR brain did not showed  stable appearance compared to prior imaging in 07/2018.  EEG showed mild slowing but no seizure activity.  He was started on valproic acid for possible seizures which he has remained on and has not had any additional loss of consciousness episodes.  He also reports resolution of diarrhea and improvement of appetite.  Stable from stroke standpoint without new stroke/TIA symptoms. Compliant on aspirin and atorvastatin without associated side effects. Blood pressure today 91/61. Ambulates without assistive device indoors normally but occasionally will use a rolling walker. Use of w/c long distance.   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  He plans on undergoing bronchoscopy currently scheduled 7/25 for further diagnosis.  He routinely follows with oncology and pulmonology.  Consult visit 05/06/2020 Dr. Leonie [name]: [name] is a 60 year old Caucasian male is referred today for evaluation for episodes of brief loss of consciousness.  History is obtained from the patient and his wife and daughter eyewitness as well as review of electronic medical records and personally reviewed pertinent imaging films in PACS.  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  He has had multiple recent hospitalizations mostly related to dehydration and diarrhea and several ER visits.  The patient and wife however report that he has had multiple episodes of brief loss of consciousness.  Episodes are stereotypical with the patient's turning his neck  upwards with eyes rolling back the patient been briefly unresponsive.  He usually complains of eyes being fuzzy and needing to sit down.  At times he can hold on and make it other times he just falls down and needs to  be caught otherwise he will hit himself.  Episodes of brief and regains consciousness quickly.  Patient however does not remember the episode slated for a while though is not confused or disoriented.  There have been no witnessed tonic-clonic activity, tongue bite or incontinence.  This episode started in 2020 settle down after this for a few years but in the last few months episodes seem to have occurred again and now occur at variable frequency from several a day to once every few days.  Patient underwent a CT angiogram of the brain and neck on one of the recent ER admissions on 04/30/2020 which showed 60% stenosis of the right internal carotid artery at the bulb and 60% stenosis of left ICA at the skull base as well as moderate 50% bilateral carotid siphon stenosis.  Both vertebral arteries were occluded beyond the origin of the PICA.  These findings were not significantly worse compared with previous CT angiogram from 03/04/2019.   He has remote history of bilateral pontine strokes in July 2020 when MRI showed R > L pontine infarcts.  MRA showed occlusion bilateral VA's.  CTA head/neck showed bilateral V4 stenosis with occlusion distal VA and BA distally, right ICA 70% and left ICA 65% narrowing, distal left ICA 60% at cervical petrous junction, and severe ICA atherosclerosis, left CCA 40 to 50% stenosis, right VA 50% stenosis, bilateral V2 60 to 70% stenosis and left subclavian 65% stenosis.  Underwent cerebral angiogram by Dr. 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  No further interventions recommended at that time.  He also has history ofHeavy EtOH use and substance use with cessation counseling provided.  Other stroke risk factors include former tobacco use, family history of stroke and CAD s/p DES 2014.    Patient has not been evaluated for seizures with recent EEG or brain imaging studies.  He denies prior history of seizures.  He has  an appointment with a gastroenterologist in a few weeks and with his cancer doctor as well to discuss more treatment for his what appears to be recurrent cancer.    ROS:   14 system review of systems is positive for those listed in HPI and other systems negative  PMH:  Past Medical History:  Diagnosis Date   Anemia    Anxiety    Arthritis    Cancer of larynx (HCC)    Carotid artery stenosis    Chronic lower back pain    Coronary artery disease    a. Diag cath 10/2012 for CP/abnormal nuc -> PCI s/p LAD atherectomy/DES placement 11/05/12.   Depression    Dilated aortic root (HCC)    GERD (gastroesophageal reflux disease)    History of hiatal hernia    Hypercholesteremia    Hypertension    Pneumonia    S/P percutaneous endoscopic gastrostomy (PEG) tube placement [facility])    Stroke ([place]) 07/31/2018   Pons    Social History:  Social History   Socioeconomic History   Marital status: Married    Spouse name: [name]   Number of children: 0   Years of education: Not on file   Highest education level: Not on file  Occupational History   Occupation:  Disabled    Comment: previously worked in Nurse, learning disability as an Clinical biochemist  Tobacco Use   Smoking status: Former    Packs/day: 3.00    Years: 32.00    Pack years: 96.00    Types: Cigarettes    Start date: 08/29/1977    Quit date: 11/29/2006    Years since quitting: 13.8   Smokeless tobacco: Former    Types: Chew    Quit date: 2003   Tobacco comments:    11/05/2012 "chewed tobacco when I play ball; haven't chewed since age 60"  Vaping Use   Vaping Use: Never used  Substance and Sexual Activity   Alcohol use: Not Currently    Alcohol/week: 12.0 standard drinks    Types: 12 Cans of beer per week    Comment: per wife [name] 6-7/week now as of 02/24/19   Drug use: Not Currently    Types: Marijuana    Comment: Last use was on - denies on 04/08/19   Sexual activity: Not on file  Other Topics Concern   Not on file  Social  History Narrative   Lives with wife at home   Right Handed   Drinks 5-6 cups caffeine daily   Social Determinants of Health   Financial Resource Strain: Not on file  Food Insecurity: Not on file  Transportation Needs: Not on file  Physical Activity: Not on file  Stress: Not on file  Social Connections: Not on file  Intimate Partner Violence: Not on file    Medications:   Current Outpatient Medications on File Prior to Visit  Medication Sig Dispense Refill   acetaminophen (TYLENOL) 325 MG tablet Take 1.5 tablets (487.5 mg total) by mouth every 6 (six) hours as needed for mild pain, fever or headache (or Fever >/= 101).     aspirin EC 81 MG tablet Take 1 tablet (81 mg total) by mouth daily with breakfast. 30 tablet 4   atorvastatin (LIPITOR) 40 MG tablet Take 80 mg by mouth at bedtime.     diazepam (VALIUM) 10 MG tablet Take 10 mg by mouth 3 (three) times daily as needed.     dronabinol (MARINOL) 5 MG capsule TAKE 1 CAPSULE(5 MG) BY MOUTH TWICE DAILY BEFORE A MEAL 60 capsule 3   DULoxetine (CYMBALTA) 60 MG capsule Take 60 mg by mouth daily.     escitalopram (LEXAPRO) 10 MG tablet Take 1 tablet (10 mg total) by mouth daily. 30 tablet 1   HYDROcodone-acetaminophen (NORCO) 10-325 MG tablet Take 1 tablet by mouth every 4 (four) hours as needed. 50 tablet 0   lactulose (CHRONULAC) 10 GM/15ML solution Take 30 mLs by mouth 4 (four) times daily as needed.     LINZESS 290 MCG CAPS capsule Take 290 mcg by mouth daily.     metoprolol tartrate (LOPRESSOR) 25 MG tablet Take 12.5 mg by mouth 2 (two) times daily.     mirtazapine (REMERON) 7.5 MG tablet Take 1 tablet (7.5 mg total) by mouth at bedtime. 30 tablet 2   nitroGLYCERIN (NITROSTAT) 0.4 MG SL tablet Place 1 tablet (0.4 mg total) under the tongue every 5 (five) minutes as needed for chest pain (CP or SOB). 25 tablet 3   omeprazole (PRILOSEC) 20 MG capsule Take 20 mg by mouth every morning.     potassium chloride SA (KLOR-CON) 20 MEQ tablet  Take 20 mEq by mouth daily.     prochlorperazine (COMPAZINE) 10 MG tablet TAKE 1 TABLET(10 MG) BY MOUTH EVERY 6 HOURS AS  NEEDED FOR NAUSEA 60 tablet 2   valproic acid (DEPAKENE) 250 MG/5ML solution Take 10 mLs (500 mg total) by mouth 3 (three) times daily. 900 mL 3   No current facility-administered medications on file prior to visit.    Allergies:   Allergies  Allergen Reactions   Cymbalta [Duloxetine Hcl]    Duloxetine Other (See Comments)    Caused depression/ patient is taking   Prednisone Other (See Comments)    Chest pain    Physical Exam Today's Vitals   10/06/20 1317  BP: 116/74  Pulse: 60  Weight: 145 lb (65.8 kg)  Height: 6' (1.829 m)   Body mass index is 19.67 kg/m.  General: Frail malnourished looking very pleasant middle-aged Caucasian male seated, in no evident distress Head: head normocephalic and atraumatic.   Neck: supple with no carotid or supraclavicular bruits Cardiovascular: regular rate and rhythm, no murmurs Musculoskeletal: no deformity Skin:  no rash/petichiae  Neurologic Exam Mental Status: Awake and fully alert.  Fluent speech and language.  Oriented to place and time. Recent and remote memory intact. Attention span, concentration and fund of knowledge appropriate. Mood and affect appropriate.  Cranial Nerves: Pupils equal, briskly reactive to light. Extraocular movements full without nystagmus. Visual fields full to confrontation. Hearing intact. Facial sensation intact. Face, tongue, palate moves normally and symmetrically.  Motor: Normal bulk and tone. Normal strength in all tested extremity muscles except mild bilateral ankle dorsiflexor weakness. Sensory.: intact to touch , pinprick , position and vibratory sensation.  Coordination: Rapid alternating movements normal in all extremities. Finger-to-nose and heel-to-shin performed accurately bilaterally. Gait and Station: Deferred as RW not present during visit Reflexes: 1+ and symmetric. Toes  downgoing.      ASSESSMENT:Sreekar Wanda Plump Bogdon is a 60 y.o. year old male with hx of R>L pontine infarcts on 07/31/2018 secondary to large vessel disease source. Vascular risk factors include uncontrolled HTN, HLD, CAD, intra-and extracranial stenosis heavy EtOH use and substance abuse. dx'd malignant neoplasm of supraglottis 01/2019 s/p chemo and XRT with evidence of lung nodules increasing in size and pathology consistent with malignancy s/p XRT.  Multiple episodes of brief loss of consciousness with memory loss possibly syncopal events in the setting of dehydration from chronic diarrhea and significant occlusive extracranial intracranial disease though complex partial seizures are possible though less likely    PLAN:  Loss of consciousness -likely in setting of syncope with dehydration as above although unable to rule out possible seizure activity -No additional events since starting valproic acid -continue Depakote 500 mg suspension 3 times daily -Repeat Depakote level and LFTs -EEG 06/2020 mild slowing but no evidence of seizure activity -MR brain 05/2020 no significant change compared to prior MRI in 07/2018  2. Hx of stroke -Continue aspirin and atorvastatin for secondary stroke prevention -Continue follow-up with PCP for aggressive stroke risk factor management including HTN with BP goal<130/90 and HLD with LDL goal<70 -Repeat lipid panel today  3.  Carotid stenosis -Recent carotid ultrasound right ICA 60 to 79% stenosis and left ICA 1 to 39% stenosis -Monitored and managed by Dr. Estanislado [name]  4.  Possible peripheral arterial disease -LE vascular changes shortly after sitting -Referral placed to vascular surgery for further evaluation   Follow-up in 6 months or call earlier if needed   CC:  [facility] provider: Dr. Jomarie Longs, MD   I spent 36 minutes of face-to-face and non-face-to-face time with patient and wife.  This included previsit chart review, lab review, study review,  electronic  health record documentation, and patient and wife education and discussion regarding episodes of loss of consciousness and possible etiologies, ongoing use of valproic acid and indication for lab work, prior history of stroke with secondary stroke prevention education and importance of aggressive stroke risk factor management, wife concerned regarding LE vascular changes and answered all other questions to patient and wife satisfaction  Frann Rider, AGNP-BC  [facility] [address] [place], [place] [address]  Phone [phone] Fax [phone] Note: This document was prepared with digital dictation and possible smart phrase technology. Any transcriptional errors that result from this process are unintentional.

## 2020-10-07 ENCOUNTER — Other Ambulatory Visit: Payer: Self-pay | Admitting: Neurology

## 2020-10-07 LAB — HEMOGLOBIN A1C
Est. average glucose Bld gHb Est-mCnc: 105 mg/dL
Hgb A1c MFr Bld: 5.3 % (ref 4.8–5.6)

## 2020-10-07 LAB — LIPID PANEL
Chol/HDL Ratio: 2.7 ratio (ref 0.0–5.0)
Cholesterol, Total: 126 mg/dL (ref 100–199)
HDL: 46 mg/dL (ref >39)
LDL Chol Calc (NIH): 64 mg/dL (ref 0–99)
Triglycerides: 84 mg/dL (ref 0–149)
VLDL Cholesterol Cal: 16 mg/dL (ref 5–40)

## 2020-10-07 LAB — VALPROIC ACID LEVEL: Valproic Acid Lvl: 83 ug/mL (ref 50–100)

## 2020-10-12 ENCOUNTER — Telehealth (HOSPITAL_COMMUNITY): Payer: Self-pay

## 2020-10-12 NOTE — Telephone Encounter (Signed)
Pt's wife agreed to have him f/u in 6 months with diagnostic angiogram. AW

## 2020-11-01 ENCOUNTER — Other Ambulatory Visit: Payer: Self-pay | Admitting: *Deleted

## 2020-11-01 DIAGNOSIS — I739 Peripheral vascular disease, unspecified: Secondary | ICD-10-CM

## 2020-11-03 ENCOUNTER — Other Ambulatory Visit: Payer: Self-pay

## 2020-11-03 ENCOUNTER — Ambulatory Visit (INDEPENDENT_AMBULATORY_CARE_PROVIDER_SITE_OTHER): Payer: 59 | Admitting: Vascular Surgery

## 2020-11-03 ENCOUNTER — Encounter: Payer: Self-pay | Admitting: Vascular Surgery

## 2020-11-03 ENCOUNTER — Ambulatory Visit (INDEPENDENT_AMBULATORY_CARE_PROVIDER_SITE_OTHER): Payer: 59

## 2020-11-03 VITALS — BP 135/83 | HR 58 | Temp 97.5°F | Resp 18 | Ht 72.0 in | Wt 151.0 lb

## 2020-11-03 DIAGNOSIS — I739 Peripheral vascular disease, unspecified: Secondary | ICD-10-CM | POA: Diagnosis not present

## 2020-11-03 NOTE — Progress Notes (Signed)
[facility] [place]  Patient name: [name] MRN: [id number] DOB: [birth year] Sex: male  REASON FOR CONSULT: Evaluation of lower extremity arterial insufficiency  HPI: [name] is a 60 y.o. male, who is here today for evaluation of lower extremity arterial insufficiency.  He is here today with his wife.  He has a very complex past history.  He had laryngeal cancer years ago requiring surgery and has a PEG placement.  He recently has been evaluated for new lung cancer with possible metastatic disease.  He is receiving radiation treatment for this.  He struck his left great toe on a bedpost and had some bruising under the nailbed.  His wife reports that they had a acquaintance who had a similar issue and ended up with an amputation and is quite concerned.  He does not have any history of claudication but does not do a great deal of walking.  Does have a history of prior coronary disease with coronary stenting and also history of stroke.  He denies any history of prior tissue loss in his lower extremities.  He does not have any arterial rest pain in his lower extremities  Past Medical History:  Diagnosis Date   Anemia    Anxiety    Arthritis    Cancer of larynx (HCC)    Carotid artery stenosis    Chronic lower back pain    Coronary artery disease    a. Diag cath 10/2012 for CP/abnormal nuc -> PCI s/p LAD atherectomy/DES placement 11/05/12.   Depression    Dilated aortic root (HCC)    GERD (gastroesophageal reflux disease)    History of hiatal hernia    Hypercholesteremia    Hypertension    Pneumonia    S/P percutaneous endoscopic gastrostomy (PEG) tube placement ([place])    Stroke ([place]) 07/31/2018   Pons    Family History  Problem Relation Age of Onset   Hypertension Mother    Heart disease Father    Hypertension Father    Heart disease Sister    Hypertension Maternal Grandmother    Hypertension Maternal Grandfather     Hypertension Paternal Grandmother    Hypertension Paternal Grandfather    Pancreatic cancer Maternal Uncle    Breast cancer Maternal Aunt    Breast cancer Maternal Aunt    Diabetes Paternal Uncle    Diabetes Paternal Aunt     SOCIAL HISTORY: Social History   Socioeconomic History   Marital status: Married    Spouse name: [name]   Number of children: 0   Years of education: Not on file   Highest education level: Not on file  Occupational History   Occupation: Disabled    Comment: previously worked in Nurse, learning disability as an Clinical biochemist  Tobacco Use   Smoking status: Former    Packs/day: 3.00    Years: 32.00    Pack years: 96.00    Types: Cigarettes    Start date: 08/29/1977    Quit date: 11/29/2006    Years since quitting: 13.9   Smokeless tobacco: Former    Types: Chew    Quit date: 2003   Tobacco comments:    11/05/2012 "chewed tobacco when I play ball; haven't chewed since age 56"  Vaping Use   Vaping Use: Never used  Substance and Sexual Activity   Alcohol use: Not Currently    Alcohol/week: 12.0 standard drinks    Types: 12 Cans of beer per week  Comment: per wife [name] 6-7/week now as of 02/24/19   Drug use: Not Currently    Types: Marijuana    Comment: Last use was on - denies on 04/08/19   Sexual activity: Not on file  Other Topics Concern   Not on file  Social History Narrative   Lives with wife at home   Right Handed   Drinks 5-6 cups caffeine daily   Social Determinants of Health   Financial Resource Strain: Not on file  Food Insecurity: Not on file  Transportation Needs: Not on file  Physical Activity: Not on file  Stress: Not on file  Social Connections: Not on file  Intimate Partner Violence: Not on file    Allergies  Allergen Reactions   Cymbalta [Duloxetine Hcl]    Duloxetine Other (See Comments)    Caused depression/ patient is taking   Prednisone Other (See Comments)    Chest pain    Current Outpatient Medications   Medication Sig Dispense Refill   acetaminophen (TYLENOL) 325 MG tablet Take 1.5 tablets (487.5 mg total) by mouth every 6 (six) hours as needed for mild pain, fever or headache (or Fever >/= 101).     aspirin EC 81 MG tablet Take 1 tablet (81 mg total) by mouth daily with breakfast. 30 tablet 4   atorvastatin (LIPITOR) 40 MG tablet Take 80 mg by mouth at bedtime.     diazepam (VALIUM) 10 MG tablet Take 10 mg by mouth 3 (three) times daily as needed.     dronabinol (MARINOL) 5 MG capsule TAKE 1 CAPSULE(5 MG) BY MOUTH TWICE DAILY BEFORE A MEAL 60 capsule 3   DULoxetine (CYMBALTA) 60 MG capsule Take 60 mg by mouth daily.     metoprolol tartrate (LOPRESSOR) 25 MG tablet Take 12.5 mg by mouth 2 (two) times daily.     nitroGLYCERIN (NITROSTAT) 0.4 MG SL tablet Place 1 tablet (0.4 mg total) under the tongue every 5 (five) minutes as needed for chest pain (CP or SOB). 25 tablet 3   omeprazole (PRILOSEC) 20 MG capsule Take 20 mg by mouth every morning.     potassium chloride SA (KLOR-CON) 20 MEQ tablet Take 20 mEq by mouth daily.     valproic acid (DEPAKENE) 250 MG/5ML solution TAKE 10 ML BY MOUTH THREE TIMES DAILY 900 mL 5   escitalopram (LEXAPRO) 10 MG tablet Take 1 tablet (10 mg total) by mouth daily. (Patient not taking: Reported on 11/03/2020) 30 tablet 1   HYDROcodone-acetaminophen (NORCO) 10-325 MG tablet Take 1 tablet by mouth every 4 (four) hours as needed. (Patient not taking: Reported on 11/03/2020) 50 tablet 0   lactulose (CHRONULAC) 10 GM/15ML solution Take 30 mLs by mouth 4 (four) times daily as needed. (Patient not taking: Reported on 11/03/2020)     LINZESS 290 MCG CAPS capsule Take 290 mcg by mouth daily. (Patient not taking: Reported on 11/03/2020)     mirtazapine (REMERON) 7.5 MG tablet Take 1 tablet (7.5 mg total) by mouth at bedtime. (Patient not taking: Reported on 11/03/2020) 30 tablet 2   prochlorperazine (COMPAZINE) 10 MG tablet TAKE 1 TABLET(10 MG) BY MOUTH EVERY 6 HOURS AS NEEDED FOR  NAUSEA (Patient not taking: Reported on 11/03/2020) 60 tablet 2   No current facility-administered medications for this visit.    REVIEW OF SYSTEMS:  [X]  denotes positive finding, [ ]  denotes negative finding Cardiac  Comments:  Chest pain or chest pressure:    Shortness of breath upon exertion: x   Short  of breath when lying flat:    Irregular heart rhythm:        Vascular    Pain in calf, thigh, or hip brought on by ambulation:    Pain in feet at night that wakes you up from your sleep:     Blood clot in your veins:    Leg swelling:         Pulmonary    Oxygen at home:    Productive cough:     Wheezing:         Neurologic    Sudden weakness in arms or legs:     Sudden numbness in arms or legs:     Sudden onset of difficulty speaking or slurred speech:    Temporary loss of vision in one eye:     Problems with dizziness:         Gastrointestinal    Blood in stool:     Vomited blood:         Genitourinary    Burning when urinating:     Blood in urine:        Psychiatric    Major depression:         Hematologic    Bleeding problems:    Problems with blood clotting too easily:        Skin    Rashes or ulcers:        Constitutional    Fever or chills:      PHYSICAL EXAM: Vitals:   11/03/20 1527  BP: 135/83  Pulse: (!) 58  Resp: 18  Temp: (!) 97.5 F (36.4 C)  TempSrc: Temporal  SpO2: 92%  Weight: 151 lb (68.5 kg)  Height: 6' (1.829 m)    GENERAL: The patient is a well-nourished male, in no acute distress. The vital signs are documented above. CARDIOVASCULAR: 2+ radial pulses bilaterally.  2+ femoral pulses bilaterally.  I do not palpate popliteal or distal pulses bilaterally PULMONARY: There is good air exchange  MUSCULOSKELETAL: There are no major deformities or cyanosis. NEUROLOGIC: No focal weakness or paresthesias are detected. SKIN: There are no ulcers or rashes noted.  He does have dependent rubor in both feet.  Does have a bruise under the  left great toe nailbed PSYCHIATRIC: The patient has a normal affect.  DATA:  Noninvasive studies today were reviewed with the patient.  His ankle arm index is 0.63 on the right and 0.49 on the left with monophasic flow bilaterally  MEDICAL ISSUES: The patient does not have any symptoms of rest pain or claudication.  I feel that he has adequate flow to heal his nailbed hematoma.  I did explain the importance of keeping a close eye on his feet and seeking attention immediately should he develop any worsening ulceration.  Would not recommend any follow-up noninvasive studies since we would not make any change in this recommendation based on noninvasive studies.  As long as he remains asymptomatic with no tissue loss would follow expectantly.   Rosetta Posner, MD FACS [facility] [phone] Pager [phone]  Note: Portions of this report may have been transcribed using voice recognition software.  Every effort has been made to ensure accuracy; however, inadvertent computerized transcription errors may still be present.

## 2020-11-23 ENCOUNTER — Ambulatory Visit: Payer: Self-pay | Admitting: Radiation Oncology

## 2020-11-24 ENCOUNTER — Other Ambulatory Visit: Payer: Medicaid Other | Admitting: Nurse Practitioner

## 2020-11-24 ENCOUNTER — Other Ambulatory Visit: Payer: Self-pay

## 2020-11-24 ENCOUNTER — Telehealth: Payer: Self-pay | Admitting: Nurse Practitioner

## 2020-11-24 NOTE — Telephone Encounter (Signed)
I called [name] about f/u pc visit today, no answer, message left with contact information to return call

## 2020-12-06 ENCOUNTER — Ambulatory Visit (HOSPITAL_COMMUNITY): Admission: RE | Admit: 2020-12-06 | Payer: 59 | Source: Ambulatory Visit

## 2020-12-06 ENCOUNTER — Encounter (HOSPITAL_COMMUNITY): Payer: Self-pay

## 2020-12-08 ENCOUNTER — Telehealth: Payer: Self-pay | Admitting: *Deleted

## 2020-12-08 ENCOUNTER — Ambulatory Visit: Payer: 59 | Admitting: Radiation Oncology

## 2020-12-08 NOTE — Telephone Encounter (Signed)
CALLED PATIENT TO INFORM OF CT FOR 01-18-21- ARRIVAL TIME- 1:45 PM @ [facility], PATIENT TO HAVE WATER ONLY - 4 HRS. PRIOR TO TEST, PATIENT TO FU WITH DR. Isidore Moos FOR RESULTS ON 01-19-21 @ 11:40 AM, SPOKE WITH PATIENT'S WIFE- [name] AND SHE IS AWARE OF THESE APPTS.

## 2020-12-08 NOTE — Telephone Encounter (Signed)
CALLED PATIENT TO INFORM THAT HE DOESN'T NEED TO COME TODAY FOR FU DUE TO NOT HAVING CT, SPOKE WITH PATIENT'S WIFE- [name] AND SHE IS AWARE, CT AND FU TO BE RESCHEDULED, PATIENT'S WIFE VERIFIED UNDERSTANDING THIS

## 2020-12-30 ENCOUNTER — Encounter (HOSPITAL_COMMUNITY): Payer: Self-pay | Admitting: Hematology

## 2020-12-31 ENCOUNTER — Other Ambulatory Visit (HOSPITAL_COMMUNITY): Payer: Self-pay | Admitting: Hematology

## 2021-01-11 ENCOUNTER — Encounter (HOSPITAL_COMMUNITY): Payer: Self-pay | Admitting: Hematology

## 2021-01-13 DIAGNOSIS — G894 Chronic pain syndrome: Secondary | ICD-10-CM | POA: Diagnosis not present

## 2021-01-13 DIAGNOSIS — C321 Malignant neoplasm of supraglottis: Secondary | ICD-10-CM | POA: Diagnosis not present

## 2021-01-14 ENCOUNTER — Encounter (HOSPITAL_COMMUNITY): Payer: Self-pay | Admitting: Hematology

## 2021-01-18 ENCOUNTER — Encounter (HOSPITAL_COMMUNITY): Payer: Self-pay

## 2021-01-18 ENCOUNTER — Other Ambulatory Visit: Payer: Self-pay

## 2021-01-18 ENCOUNTER — Ambulatory Visit (HOSPITAL_COMMUNITY)
Admission: RE | Admit: 2021-01-18 | Discharge: 2021-01-18 | Disposition: A | Discharge status: Home / Self Care | Payer: Medicare Other | Source: Ambulatory Visit | Attending: Radiation Oncology | Admitting: Radiation Oncology

## 2021-01-18 ENCOUNTER — Encounter (HOSPITAL_COMMUNITY): Payer: Self-pay | Admitting: Hematology

## 2021-01-18 ENCOUNTER — Inpatient Hospital Stay (HOSPITAL_COMMUNITY): Payer: Medicare Other | Attending: Hematology

## 2021-01-18 DIAGNOSIS — Z923 Personal history of irradiation: Secondary | ICD-10-CM | POA: Diagnosis not present

## 2021-01-18 DIAGNOSIS — Z85118 Personal history of other malignant neoplasm of bronchus and lung: Secondary | ICD-10-CM | POA: Diagnosis not present

## 2021-01-18 DIAGNOSIS — Z79899 Other long term (current) drug therapy: Secondary | ICD-10-CM | POA: Insufficient documentation

## 2021-01-18 DIAGNOSIS — J439 Emphysema, unspecified: Secondary | ICD-10-CM | POA: Diagnosis not present

## 2021-01-18 DIAGNOSIS — C7802 Secondary malignant neoplasm of left lung: Secondary | ICD-10-CM | POA: Insufficient documentation

## 2021-01-18 DIAGNOSIS — C3411 Malignant neoplasm of upper lobe, right bronchus or lung: Secondary | ICD-10-CM | POA: Diagnosis not present

## 2021-01-18 DIAGNOSIS — C3490 Malignant neoplasm of unspecified part of unspecified bronchus or lung: Secondary | ICD-10-CM | POA: Diagnosis not present

## 2021-01-18 DIAGNOSIS — C321 Malignant neoplasm of supraglottis: Secondary | ICD-10-CM | POA: Insufficient documentation

## 2021-01-18 DIAGNOSIS — I651 Occlusion and stenosis of basilar artery: Secondary | ICD-10-CM | POA: Diagnosis not present

## 2021-01-18 DIAGNOSIS — I700 Atherosclerosis of aorta: Secondary | ICD-10-CM | POA: Diagnosis not present

## 2021-01-18 DIAGNOSIS — I6523 Occlusion and stenosis of bilateral carotid arteries: Secondary | ICD-10-CM | POA: Diagnosis not present

## 2021-01-18 DIAGNOSIS — K110 Atrophy of salivary gland: Secondary | ICD-10-CM | POA: Diagnosis not present

## 2021-01-18 DIAGNOSIS — C760 Malignant neoplasm of head, face and neck: Secondary | ICD-10-CM

## 2021-01-18 DIAGNOSIS — M47812 Spondylosis without myelopathy or radiculopathy, cervical region: Secondary | ICD-10-CM | POA: Diagnosis not present

## 2021-01-18 DIAGNOSIS — R911 Solitary pulmonary nodule: Secondary | ICD-10-CM | POA: Diagnosis not present

## 2021-01-18 LAB — CBC WITH DIFFERENTIAL/PLATELET
Abs Immature Granulocytes: 0.03 10*3/uL (ref 0.00–0.07)
Basophils Absolute: 0 10*3/uL (ref 0.0–0.1)
Basophils Relative: 1 %
Eosinophils Absolute: 0.7 10*3/uL — ABNORMAL HIGH (ref 0.0–0.5)
Eosinophils Relative: 9 %
HCT: 39 % (ref 39.0–52.0)
Hemoglobin: 12.9 g/dL — ABNORMAL LOW (ref 13.0–17.0)
Immature Granulocytes: 0 %
Lymphocytes Relative: 12 %
Lymphs Abs: 0.8 10*3/uL (ref 0.7–4.0)
MCH: 31.1 pg (ref 26.0–34.0)
MCHC: 33.1 g/dL (ref 30.0–36.0)
MCV: 94 fL (ref 80.0–100.0)
Monocytes Absolute: 0.7 10*3/uL (ref 0.1–1.0)
Monocytes Relative: 10 %
Neutro Abs: 4.9 10*3/uL (ref 1.7–7.7)
Neutrophils Relative %: 68 %
Platelets: 185 10*3/uL (ref 150–400)
RBC: 4.15 MIL/uL — ABNORMAL LOW (ref 4.22–5.81)
RDW: 14.5 % (ref 11.5–15.5)
WBC: 7.2 10*3/uL (ref 4.0–10.5)
nRBC: 0 % (ref 0.0–0.2)

## 2021-01-18 LAB — COMPREHENSIVE METABOLIC PANEL
ALT: 22 U/L (ref 0–44)
AST: 31 U/L (ref 15–41)
Albumin: 3.5 g/dL (ref 3.5–5.0)
Alkaline Phosphatase: 82 U/L (ref 38–126)
Anion gap: 6 (ref 5–15)
BUN: 12 mg/dL (ref 6–20)
CO2: 30 mmol/L (ref 22–32)
Calcium: 8.7 mg/dL — ABNORMAL LOW (ref 8.9–10.3)
Chloride: 91 mmol/L — ABNORMAL LOW (ref 98–111)
Creatinine, Ser: 0.84 mg/dL (ref 0.61–1.24)
GFR, Estimated: >60 mL/min (ref >60)
Glucose, Bld: 91 mg/dL (ref 70–99)
Potassium: 4.2 mmol/L (ref 3.5–5.1)
Sodium: 127 mmol/L — ABNORMAL LOW (ref 135–145)
Total Bilirubin: 0.4 mg/dL (ref 0.3–1.2)
Total Protein: 7 g/dL (ref 6.5–8.1)

## 2021-01-18 LAB — IRON AND TIBC
Iron: 61 ug/dL (ref 45–182)
Saturation Ratios: 16 % — ABNORMAL LOW (ref 17.9–39.5)
TIBC: 373 ug/dL (ref 250–450)
UIBC: 312 ug/dL

## 2021-01-18 LAB — POCT I-STAT CREATININE: Creatinine, Ser: 1.1 mg/dL (ref 0.61–1.24)

## 2021-01-18 LAB — TSH: TSH: 5.568 u[IU]/mL — ABNORMAL HIGH (ref 0.350–4.500)

## 2021-01-18 LAB — VITAMIN B12: Vitamin B-12: 1268 pg/mL — ABNORMAL HIGH (ref 180–914)

## 2021-01-18 LAB — FERRITIN: Ferritin: 323 ng/mL (ref 24–336)

## 2021-01-18 LAB — FOLATE: Folate: 17 ng/mL (ref >5.9)

## 2021-01-18 MED ORDER — IOHEXOL 300 MG/ML  SOLN
100.0000 mL | Freq: Once | INTRAMUSCULAR | Status: AC | PRN
  Administered 2021-01-18: 100 mL via INTRAVENOUS

## 2021-01-19 ENCOUNTER — Ambulatory Visit
Admission: RE | Admit: 2021-01-19 | Discharge: 2021-01-19 | Disposition: A | Discharge status: Home / Self Care | Payer: Medicare Other | Source: Ambulatory Visit | Attending: Radiation Oncology | Admitting: Radiation Oncology

## 2021-01-19 ENCOUNTER — Encounter (HOSPITAL_COMMUNITY): Payer: Self-pay | Admitting: Hematology

## 2021-01-19 VITALS — BP 113/67 | HR 66 | Temp 98.7°F | Resp 18 | Ht 72.0 in | Wt 161.5 lb

## 2021-01-19 DIAGNOSIS — I700 Atherosclerosis of aorta: Secondary | ICD-10-CM | POA: Diagnosis not present

## 2021-01-19 DIAGNOSIS — R911 Solitary pulmonary nodule: Secondary | ICD-10-CM

## 2021-01-19 DIAGNOSIS — C3432 Malignant neoplasm of lower lobe, left bronchus or lung: Secondary | ICD-10-CM | POA: Diagnosis not present

## 2021-01-19 DIAGNOSIS — Z08 Encounter for follow-up examination after completed treatment for malignant neoplasm: Secondary | ICD-10-CM | POA: Diagnosis not present

## 2021-01-19 DIAGNOSIS — J432 Centrilobular emphysema: Secondary | ICD-10-CM | POA: Insufficient documentation

## 2021-01-19 DIAGNOSIS — I2510 Atherosclerotic heart disease of native coronary artery without angina pectoris: Secondary | ICD-10-CM | POA: Diagnosis not present

## 2021-01-19 DIAGNOSIS — Z85818 Personal history of malignant neoplasm of other sites of lip, oral cavity, and pharynx: Secondary | ICD-10-CM | POA: Diagnosis not present

## 2021-01-19 DIAGNOSIS — Z7982 Long term (current) use of aspirin: Secondary | ICD-10-CM | POA: Insufficient documentation

## 2021-01-19 DIAGNOSIS — C7802 Secondary malignant neoplasm of left lung: Secondary | ICD-10-CM | POA: Diagnosis not present

## 2021-01-19 DIAGNOSIS — Z87891 Personal history of nicotine dependence: Secondary | ICD-10-CM | POA: Diagnosis not present

## 2021-01-19 DIAGNOSIS — C321 Malignant neoplasm of supraglottis: Secondary | ICD-10-CM

## 2021-01-19 DIAGNOSIS — M47812 Spondylosis without myelopathy or radiculopathy, cervical region: Secondary | ICD-10-CM | POA: Insufficient documentation

## 2021-01-19 DIAGNOSIS — Z79899 Other long term (current) drug therapy: Secondary | ICD-10-CM | POA: Diagnosis not present

## 2021-01-19 DIAGNOSIS — R946 Abnormal results of thyroid function studies: Secondary | ICD-10-CM | POA: Insufficient documentation

## 2021-01-19 DIAGNOSIS — C3411 Malignant neoplasm of upper lobe, right bronchus or lung: Secondary | ICD-10-CM | POA: Diagnosis not present

## 2021-01-19 NOTE — Progress Notes (Signed)
[name] presents today for follow-up after completing SBRT treatment to his right upper lung and left lower lung on 08/20/2020, and to review  CT scan results from 01/18/2021; (also completed radiation to his larynx on 05/08/2019)  Pain issues, if any: Continues to deal with chronic back pain.  Using a feeding tube?: N/A--fell out and was never replaced Weight changes, if any:  Wt Readings from Last 3 Encounters:  01/19/21 161 lb 8 oz (73.3 kg)  11/03/20 151 lb (68.5 kg)  10/06/20 145 lb (65.8 kg)   Swallowing issues, if any: Overall patient denies any issues. Only reports concerns if he eats too quickly or takes too big of a bite. He feels he can eat/drink a wide variety  Respiratory concerns: Denies any new issues or concerns Smoking or chewing tobacco? Patient denies Using fluoride trays daily? N/A--has full set of dentures Last ENT visit was on: Reports he's scheduled to see Dr. [name] this coming February Other notable issues, if any: Has F/U with medical oncologist Dr. Delton Coombes on 01/27/2021. Denies any new issues or concerns. Overall reports he feels well, other than on-going pain.

## 2021-01-20 ENCOUNTER — Ambulatory Visit (HOSPITAL_COMMUNITY): Payer: 59

## 2021-01-20 ENCOUNTER — Inpatient Hospital Stay (HOSPITAL_COMMUNITY): Payer: Medicare Other

## 2021-01-21 ENCOUNTER — Encounter: Payer: Self-pay | Admitting: Radiation Oncology

## 2021-01-21 NOTE — Progress Notes (Signed)
Radiation Oncology         [phone] ________________________________  Name: [name] MRN: [id number]  Date: 01/19/2021  DOB: 07-05-1960  Follow-Up Visit Note  Outpatient  CC: [name], MD  [name], MD  Diagnosis and Prior Radiotherapy:    ICD-10-CM   1. 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 Malignant neoplasm of upper lobe of right lung ([facility])  C34.11 CT Chest Wo Contrast    3. 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 Right upper lobe pulmonary nodule  R91.1     5. Left lower lobe pulmonary nodule  R91.1       Radiation Treatment Dates: 08/10/2020 through 08/20/2020 Site Technique Total Dose (Gy) Dose per Fx (Gy) Completed Fx Beam Energies  Lung, Right: Lung_Rt_upper IMRT 50/50 10 5/5 6XFFF  Lung, Left: Lung_Lt_lower IMRT 50/50 10 5/5 6XFFF   Radiation Treatment Dates: 03/05/2019 through 05/08/2019 Site Technique Total Dose (Gy) Dose per Fx (Gy) Completed Fx Beam Energies  Larynx: HN_larynx IMRT 70/70 2 35/35 6X   CHIEF COMPLAINT: Here for follow-up and surveillance of lung and throat cancer  Narrative:   [name] presents today for follow-up after completing SBRT treatment to his right upper lung and left lower lung on 08/20/2020, and to review  CT scan results from 01/18/2021; (also completed radiation to his larynx on 05/08/2019)  Pain issues, if any: Continues to deal with chronic back pain.  Using a feeding tube?: N/A--fell out and was never replaced Weight changes, if any:  Wt Readings from Last 3 Encounters:  01/19/21 161 lb 8 oz (73.3 kg)  11/03/20 151 lb (68.5 kg)  10/06/20 145 lb (65.8 kg)   Swallowing issues, if any: Overall patient denies any issues. Only reports concerns if he eats too quickly or takes too big of a bite. He feels he can eat/drink a wide variety  Respiratory concerns: Denies any new issues or concerns Smoking or chewing tobacco? Patient denies Using  fluoride trays daily? N/A--has full set of dentures Last ENT visit was on: Reports he's scheduled to see Dr. [name] this coming February Other notable issues, if any: Has F/U with medical oncologist Dr. Delton Coombes on 01/27/2021. Denies any new issues or concerns. Overall reports he feels well, other than on-going pain.    ALLERGIES:  is allergic to cymbalta [duloxetine hcl], duloxetine, and prednisone.  Meds: Current Outpatient Medications  Medication Sig Dispense Refill   acetaminophen (TYLENOL) 325 MG tablet Take 1.5 tablets (487.5 mg total) by mouth every 6 (six) hours as needed for mild pain, fever or headache (or Fever >/= 101).     aspirin EC 81 MG tablet Take 1 tablet (81 mg total) by mouth daily with breakfast. 30 tablet 4   atorvastatin (LIPITOR) 40 MG tablet Take 80 mg by mouth at bedtime.     diazepam (VALIUM) 10 MG tablet Take 10 mg by mouth 3 (three) times daily as needed.     dronabinol (MARINOL) 5 MG capsule TAKE 1 CAPSULE(5 MG) BY MOUTH TWICE DAILY BEFORE A MEAL 60 capsule 5   DULoxetine (CYMBALTA) 60 MG capsule Take 60 mg by mouth daily.     escitalopram (LEXAPRO) 10 MG tablet Take 1 tablet (10 mg total) by mouth daily. (Patient not taking: Reported on 11/03/2020) 30 tablet 1   HYDROcodone-acetaminophen (NORCO) 10-325 MG tablet Take 1 tablet by mouth every 4 (four) hours as  needed. (Patient not taking: Reported on 11/03/2020) 50 tablet 0   lactulose (CHRONULAC) 10 GM/15ML solution Take 30 mLs by mouth 4 (four) times daily as needed. (Patient not taking: Reported on 11/03/2020)     LINZESS 290 MCG CAPS capsule Take 290 mcg by mouth daily. (Patient not taking: Reported on 11/03/2020)     metoprolol tartrate (LOPRESSOR) 25 MG tablet Take 12.5 mg by mouth 2 (two) times daily.     mirtazapine (REMERON) 7.5 MG tablet Take 1 tablet (7.5 mg total) by mouth at bedtime. (Patient not taking: Reported on 11/03/2020) 30 tablet 2   nitroGLYCERIN (NITROSTAT) 0.4 MG SL tablet Place 1 tablet (0.4  mg total) under the tongue every 5 (five) minutes as needed for chest pain (CP or SOB). 25 tablet 3   omeprazole (PRILOSEC) 40 MG capsule Take 40 mg by mouth 2 (two) times daily.     potassium chloride SA (KLOR-CON) 20 MEQ tablet Take 20 mEq by mouth daily.     prochlorperazine (COMPAZINE) 10 MG tablet TAKE 1 TABLET(10 MG) BY MOUTH EVERY 6 HOURS AS NEEDED FOR NAUSEA (Patient not taking: Reported on 11/03/2020) 60 tablet 2   valproic acid (DEPAKENE) 250 MG/5ML solution TAKE 10 ML BY MOUTH THREE TIMES DAILY 900 mL 5   No current facility-administered medications for this encounter.    Physical Findings: The patient is in no acute distress. Patient is alert and oriented.  height is 6' (1.829 m) and weight is 161 lb 8 oz (73.3 kg). His temporal temperature is 98.7 F (37.1 C). His blood pressure is 113/67 and his pulse is 66. His respiration is 18 and oxygen saturation is 95%. Marland Kitchen    HEENT: No lesions in mouth or upper throat Neck: There is some mild lymphedema in the right neck.  No palpable lymphadenopathy in the bilateral neck Heart is regular in rate and rhythm Chest is clear to auscultation bilaterally  Lab Findings: Lab Results  Component Value Date   WBC 7.2 01/18/2021   HGB 12.9 (L) 01/18/2021   HCT 39.0 01/18/2021   MCV 94.0 01/18/2021   PLT 185 01/18/2021   Lab Results  Component Value Date   TSH 5.568 (H) 01/18/2021    Radiographic Findings: CT Soft Tissue Neck W Contrast  Result Date: 01/18/2021 CLINICAL DATA:  History of supraglottic squamous cell carcinoma EXAM: CT NECK WITH CONTRAST TECHNIQUE: Multidetector CT imaging of the neck was performed using the standard protocol following the bolus administration of intravenous contrast. RADIATION DOSE REDUCTION: This exam was performed according to the departmental dose-optimization program which includes automated exposure control, adjustment of the mA and/or kV according to patient size and/or use of iterative reconstruction  technique. CONTRAST:  164mL OMNIPAQUE IOHEXOL 300 MG/ML  SOLN COMPARISON:  Multiple prior CT neck is most recently 05/17/2020, PET-CT 07/02/2020. FINDINGS: Pharynx and larynx: The nasal cavity and nasopharynx are normal. The oral cavity and oropharynx are normal. The parapharyngeal spaces are clear. No abnormal soft tissue mass is seen in the hypopharynx or larynx. The vocal folds are normal in appearance. There is no evidence of local recurrence. Salivary glands: The parotid glands are unremarkable. Posttreatment atrophy of the submandibular glands is unchanged. Thyroid: Unremarkable. Lymph nodes: There is no pathologic lymphadenopathy in the neck. Vascular: Bulky calcified atherosclerotic plaque is again noted in the bilateral carotid bulbs, better assessed on dedicated prior CTAs. The internal jugular veins are patent, right larger than left. 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 Limited intracranial: The  partially imaged intracranial compartment is unremarkable. Visualized orbits: The imaged globes and orbits are unremarkable. Mastoids and visualized paranasal sinuses: The imaged paranasal sinuses are clear. The imaged mastoid air cells are clear. Skeleton: There is mild degenerative change of the cervical spine at C5-C6 and C6-C7. There is no acute osseous abnormality or aggressive osseous lesion. Upper chest: The lungs are assessed on the separately dictated CT chest. Other: There is mild stranding in the subcutaneous fat of the right submandibular region, similar to the prior study, which may reflect postradiation change IMPRESSION: Stable posttreatment changes in the neck as above without evidence of residual or recurrent tumor. No pathologic lymphadenopathy in the neck. Electronically Signed   By: Valetta Mole M.D.   On: 01/18/2021 16:55   CT Chest W Contrast  Result Date: 01/18/2021 CLINICAL DATA:  Supraglottic head and neck cancer, right upper lobe lung cancer, status post  chemotherapy and radiation EXAM: CT CHEST WITH CONTRAST TECHNIQUE: Multidetector CT imaging of the chest was performed during intravenous contrast administration. RADIATION DOSE REDUCTION: This exam was performed according to the departmental dose-optimization program which includes automated exposure control, adjustment of the mA and/or kV according to patient size and/or use of iterative reconstruction technique. CONTRAST:  1100mL OMNIPAQUE IOHEXOL 300 MG/ML  SOLN COMPARISON:  07/23/2020 FINDINGS: Cardiovascular: Aortic atherosclerosis. Cardiomegaly. Extensive 3 vessel coronary artery calcifications and stents. No pericardial effusion. Mediastinum/Nodes: Unchanged prominent subcentimeter mediastinal and bilateral hilar lymph nodes. Thyroid gland, trachea, and esophagus demonstrate no significant findings. Lungs/Pleura: Moderate centrilobular and paraseptal emphysema. Diffuse bilateral bronchial wall thickening. Complete interval resolution of a previously seen lobulated, partially cavitary nodule of the medial right upper lobe, with a biopsy marking clip in this vicinity and new adjacent ground-glass and irregular interstitial opacity (series 505, image 46). There are new adjacent irregular nodules in the posterior right apex, measuring 0.6 cm (series 505, image 41) and 0.4 cm (series 505, image 39). Significant interval increase in bandlike scarring, atelectasis, and/or fibrosis in the dependent bilateral lung bases. There is a biopsy marking clip in the dependent left lobe with new, bandlike consolidation in this vicinity (series 505, image 113). A previously noted nodule can not be clearly distinguished within this consolidation. No pleural effusion or pneumothorax. Upper Abdomen: No acute abnormality. Musculoskeletal: No chest wall abnormality. No suspicious osseous lesions identified. IMPRESSION: 1. Complete interval resolution of a previously seen lobulated, partially cavitary nodule of the medial right upper  lobe, with a biopsy marking clip in this vicinity and new adjacent ground-glass and irregular interstitial opacity. 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 2. There are new adjacent irregular nodules in the posterior right apex, measuring 0.6 cm and 0.4 cm. These are nonspecific, possibly infectious or inflammatory in nature, modestly suspicious for metastases. Attention on follow-up. 3. There is a biopsy marking clip in the dependent left lobe at the site of a previously seen FDG avid nodule with new, bandlike consolidation in this vicinity. The nodule can not be clearly distinguished within this consolidation and may have resolved. Findings are generally consistent with treatment response and developing radiation pneumonitis and fibrosis as above. Attention on follow-up. 4. Significant interval increase in bandlike scarring, atelectasis, and/or fibrosis in the dependent bilateral lung bases, which may be related to interval infection or aspiration. 5. Emphysema and diffuse bilateral bronchial wall thickening. 6. Coronary artery disease. Aortic Atherosclerosis (ICD10-I70.0) and Emphysema (ICD10-J43.9). Electronically Signed   By: Delanna Ahmadi M.D.   On: 01/18/2021 18:59  Impression/Plan: Excellent response to SBRT to the chest.   CT scan of the neck  and physical exam are also satisfactory. I personally reviewed his images. He is in remission.  F/u in 53mo with repeat CT of chest. Patient and his wife are very happy with this news.  Thyroid function: This is being monitored by medical oncology, slightly elevated TSH noted   Lab Results  Component Value Date   TSH 5.568 (H) 01/18/2021    On date of service, in total, I spent 25 minutes on this encounter. Patient was seen in person.  _____________________________________   Eppie Gibson, MD

## 2021-01-26 ENCOUNTER — Telehealth: Payer: Self-pay

## 2021-01-26 NOTE — Telephone Encounter (Signed)
1154 am.  Phone call made to patient to complete a telephonic visit and offer a telemed visit with NP.  No answer.  Message left on VM requesting a call back.

## 2021-01-27 ENCOUNTER — Ambulatory Visit (HOSPITAL_COMMUNITY): Payer: 59 | Admitting: Hematology

## 2021-02-07 DIAGNOSIS — C321 Malignant neoplasm of supraglottis: Secondary | ICD-10-CM | POA: Diagnosis not present

## 2021-02-07 DIAGNOSIS — G894 Chronic pain syndrome: Secondary | ICD-10-CM | POA: Diagnosis not present

## 2021-02-08 ENCOUNTER — Telehealth: Payer: Self-pay

## 2021-02-08 NOTE — Telephone Encounter (Signed)
320 pm.  Follow up call made to patient.  No answer.  Message left requesting a call back.

## 2021-02-09 ENCOUNTER — Other Ambulatory Visit: Payer: Self-pay

## 2021-02-09 ENCOUNTER — Inpatient Hospital Stay (HOSPITAL_COMMUNITY): Payer: Medicare Other | Attending: Hematology | Admitting: Hematology

## 2021-02-09 VITALS — BP 112/81 | HR 64 | Temp 98.4°F | Resp 16 | Ht 72.0 in | Wt 161.2 lb

## 2021-02-09 DIAGNOSIS — C760 Malignant neoplasm of head, face and neck: Secondary | ICD-10-CM | POA: Diagnosis not present

## 2021-02-09 DIAGNOSIS — Z79899 Other long term (current) drug therapy: Secondary | ICD-10-CM | POA: Diagnosis not present

## 2021-02-09 DIAGNOSIS — R197 Diarrhea, unspecified: Secondary | ICD-10-CM | POA: Diagnosis not present

## 2021-02-09 DIAGNOSIS — C321 Malignant neoplasm of supraglottis: Secondary | ICD-10-CM | POA: Diagnosis not present

## 2021-02-09 DIAGNOSIS — C3411 Malignant neoplasm of upper lobe, right bronchus or lung: Secondary | ICD-10-CM | POA: Insufficient documentation

## 2021-02-09 DIAGNOSIS — C770 Secondary and unspecified malignant neoplasm of lymph nodes of head, face and neck: Secondary | ICD-10-CM | POA: Insufficient documentation

## 2021-02-09 MED ORDER — LACTULOSE 10 GM/15ML PO SOLN: 20.0000 g | Freq: Four times a day (QID) | ORAL | 3 refills | Status: DC | PRN

## 2021-02-09 NOTE — Progress Notes (Signed)
[facility] [place], [place] [address]   CLINIC:  Medical Oncology/Hematology  PCP:  [name], [place] [place] / [place] [address] [phone]   REASON FOR VISIT:  Follow-up for supraglottic squamous cell carcinoma  PRIOR THERAPY: Chemoradiation with weekly cisplatin from 03/07/2019 to 05/15/2019  NGS Results: not done  CURRENT THERAPY: surveillance  BRIEF ONCOLOGIC HISTORY:  Oncology History  Malignant neoplasm of supraglottis ([place])  01/22/2019 Initial Diagnosis   Malignant neoplasm of supraglottis ([place])   01/30/2019 Cancer Staging   Staging form: Larynx - Supraglottis, AJCC 8th Edition - Clinical: Stage IVA (cT2, cN2c, cM0) - Signed by [name], MD on 01/30/2019    03/07/2019 -  Chemotherapy   The patient had palonosetron (ALOXI) injection 0.25 mg, 0.25 mg, Intravenous,  Once, 6 of 6 cycles Administration: 0.25 mg (03/13/2019), 0.25 mg (03/07/2019), 0.25 mg (03/27/2019), 0.25 mg (05/01/2019), 0.25 mg (05/08/2019), 0.25 mg (05/15/2019) CISplatin (PLATINOL) 79 mg in sodium chloride 0.9 % 250 mL chemo infusion, 40 mg/m2 = 79 mg, Intravenous,  Once, 6 of 6 cycles Administration: 79 mg (03/13/2019), 79 mg (03/07/2019), 79 mg (03/27/2019), 79 mg (05/01/2019), 79 mg (05/08/2019), 79 mg (05/15/2019) fosaprepitant (EMEND) 150 mg in sodium chloride 0.9 % 145 mL IVPB, 150 mg, Intravenous,  Once, 6 of 6 cycles Administration: 150 mg (03/13/2019), 150 mg (03/07/2019), 150 mg (03/27/2019), 150 mg (05/01/2019), 150 mg (05/08/2019), 150 mg (05/15/2019)   for chemotherapy treatment.       CANCER STAGING:  Cancer Staging  Malignant neoplasm of supraglottis [facility]) Staging form: Larynx - Supraglottis, AJCC 8th Edition - Clinical: Stage IVA (cT2, cN2c, cM0) - Signed by [name], MD on 01/30/2019   INTERVAL HISTORY:  [name], a 61 y.o. male, returns for routine follow-up of his supraglottic squamous cell carcinoma. Tayshon was last seen  on 08/23/2020.   Today he reports feeling well. He reports constipation which has not been helped by lactulose. He is taking Marinol BID. He feels full easily while eating.   REVIEW OF SYSTEMS:  Review of Systems  Constitutional:  Negative for appetite change and fatigue.  Gastrointestinal:  Positive for constipation.  Psychiatric/Behavioral:  Positive for depression. The patient is nervous/anxious.   All other systems reviewed and are negative.  PAST MEDICAL/SURGICAL HISTORY:  Past Medical History:  Diagnosis Date   Anemia    Anxiety    Arthritis    Cancer of larynx (HCC)    Carotid artery stenosis    Chronic lower back pain    Coronary artery disease    a. Diag cath 10/2012 for CP/abnormal nuc -> PCI s/p LAD atherectomy/DES placement 11/05/12.   Depression    Dilated aortic root (HCC)    GERD (gastroesophageal reflux disease)    History of hiatal hernia    Hypercholesteremia    Hypertension    Pneumonia    S/P percutaneous endoscopic gastrostomy (PEG) tube placement [facility])    Stroke ([place]) 07/31/2018   Pons   Past Surgical History:  Procedure Laterality Date   BIOPSY  11/24/2019   Procedure: BIOPSY;  Surgeon: Irving Copas., MD;  Location: [place];  Service: Gastroenterology;;   BIOPSY  03/29/2020   Procedure: BIOPSY;  Surgeon: Irving Copas., MD;  Location: [facility];  Service: Gastroenterology;;   BRONCHIAL BIOPSY  07/26/2020   Procedure: BRONCHIAL BIOPSIES;  Surgeon: Garner Nash, DO;  Location: [place] [facility];  Service: Pulmonary;;   BRONCHIAL BRUSHINGS  07/26/2020   Procedure:  BRONCHIAL BRUSHINGS;  Surgeon: Garner Nash, DO;  Location: [place];  Service: Pulmonary;;   BRONCHIAL NEEDLE ASPIRATION BIOPSY  07/26/2020   Procedure: BRONCHIAL NEEDLE ASPIRATION BIOPSIES;  Surgeon: Garner Nash, DO;  Location: [place];  Service: Pulmonary;;   CARDIAC CATHETERIZATION  10/29/2012   COLONOSCOPY WITH PROPOFOL N/A 11/24/2019   Procedure:  COLONOSCOPY WITH PROPOFOL;  Surgeon: Irving Copas., MD;  Location: [place];  Service: Gastroenterology;  Laterality: N/A;   CORONARY ANGIOPLASTY WITH STENT PLACEMENT  11/05/2012   DIRECT LARYNGOSCOPY N/A 01/02/2019   Procedure: MICRO DIRECT LARYNGOSCOPY BIOPSY OF LARYNGEAL MASS;  Surgeon: [name], MD;  Location: [place];  Service: ENT;  Laterality: N/A;   ESOPHAGOGASTRODUODENOSCOPY (EGD) WITH PROPOFOL N/A 08/11/2019   Procedure: ATTEMPTED ESOPHAGOGASTRODUODENOSCOPY (EGD) WITH PROPOFOL (UNABLE TO PERFORM PERCUTANEOUS ENDOSCOPIC GASTROSTOMY TUBE REPLACEMENT);  Surgeon: [name], MD;  Location: [facility];  Service: Gastroenterology;  Laterality: N/A;   ESOPHAGOGASTRODUODENOSCOPY (EGD) WITH PROPOFOL N/A 11/24/2019   Procedure: ESOPHAGOGASTRODUODENOSCOPY (EGD) WITH PROPOFOL;  Surgeon: Rush Landmark Telford Nab., MD;  Location: [place];  Service: Gastroenterology;  Laterality: N/A;   ESOPHAGOGASTRODUODENOSCOPY (EGD) WITH PROPOFOL N/A 01/26/2020   Procedure: ESOPHAGOGASTRODUODENOSCOPY (EGD) WITH PROPOFOL;  Surgeon: Rush Landmark Telford Nab., MD;  Location: [place];  Service: Gastroenterology;  Laterality: N/A;   ESOPHAGOGASTRODUODENOSCOPY (EGD) WITH PROPOFOL N/A 03/29/2020   Procedure: ESOPHAGOGASTRODUODENOSCOPY (EGD) WITH PROPOFOL;  Surgeon: Rush Landmark Telford Nab., MD;  Location: [facility];  Service: Gastroenterology;  Laterality: N/A;  NEEDS FLOURO   FIDUCIAL MARKER PLACEMENT  07/26/2020   Procedure: FIDUCIAL MARKER PLACEMENT;  Surgeon: Garner Nash, DO;  Location: [facility];  Service: Pulmonary;;   HEMORRHOID SURGERY  ~ 2010   INSERTION OF MESH N/A 06/02/2015   Procedure: INSERTION OF MESH;  Surgeon: [name], MD;  [facility]: [facility];  Service: General;  Laterality: N/A;   IR ANGIO INTRA EXTRACRAN SEL COM CAROTID INNOMINATE BILAT MOD SED  08/02/2018   IR ANGIO VERTEBRAL SEL VERTEBRAL BILAT MOD SED  08/02/2018   IR REPLACE G-TUBE SIMPLE WO FLUORO  08/12/2019   IR [place]  GASTRO/COLONIC TUBE PERCUT W/FLUORO  08/13/2019   LAPAROSCOPIC INSERTION GASTROSTOMY TUBE N/A 02/24/2019   Procedure: LAPAROSCOPIC INSERTION GASTROSTOMY TUBE;  Surgeon: [name], MD;  Location: [place];  Service: General;  Laterality: N/A;   LEFT HEART CATHETERIZATION WITH CORONARY ANGIOGRAM N/A 10/30/2012   Procedure: LEFT HEART CATHETERIZATION WITH CORONARY ANGIOGRAM;  Surgeon: [name], MD;  Location: [place] [facility];  Service: Cardiovascular;  Laterality: N/A;   MULTIPLE EXTRACTIONS WITH ALVEOLOPLASTY N/A 02/18/2019   Procedure: Extraction of tooth #'s 5-7, 10,11,14,20-29, 31 and 32 with alveoloplasty and maxiillary left buccal exostoses reductions;  Surgeon: [name], [name];  Location: [place];  Service: Oral Surgery;  Laterality: N/A;   PEG PLACEMENT N/A 01/26/2020   Procedure: PERCUTANEOUS ENDOSCOPIC GASTROSTOMY (PEG) REPLACEMENT;  Surgeon: Irving Copas., MD;  Location: [place];  Service: Gastroenterology;  Laterality: N/A;   PERCUTANEOUS CORONARY ROTOBLATOR INTERVENTION (PCI-R) N/A 11/05/2012   Procedure: PERCUTANEOUS CORONARY ROTOBLATOR INTERVENTION (PCI-R);  Surgeon: [name], MD;  Location: [facility];  Service: Cardiovascular;  Laterality: N/A;   SAVORY DILATION N/A 11/24/2019   Procedure: SAVORY DILATION;  Surgeon: Rush Landmark Telford Nab., MD;  Location: [place];  Service: Gastroenterology;  Laterality: N/A;   SAVORY DILATION N/A 01/26/2020   Procedure: SAVORY DILATION;  Surgeon: Rush Landmark Telford Nab., MD;  Location: [place];  Service: Gastroenterology;  Laterality: N/A;   TRACHEOSTOMY TUBE PLACEMENT N/A 02/18/2019  Procedure: Tracheostomy;  Surgeon: [name], MD;  Location: [facility];  Service: ENT;  Laterality: N/A;   UMBILICAL HERNIA REPAIR N/A 06/02/2015   Procedure: UMBILICAL HERNIORRHAPHY WITH MESH;  Surgeon: [name], MD;  [facility];  Service: General;  Laterality: N/A;   VIDEO BRONCHOSCOPY WITH ENDOBRONCHIAL NAVIGATION Bilateral  07/26/2020   Procedure: VIDEO BRONCHOSCOPY WITH ENDOBRONCHIAL NAVIGATION;  Surgeon: Garner Nash, DO;  Location: [place];  Service: Pulmonary;  Laterality: Bilateral;  ION   VIDEO BRONCHOSCOPY WITH RADIAL ENDOBRONCHIAL ULTRASOUND  07/26/2020   Procedure: VIDEO BRONCHOSCOPY WITH RADIAL ENDOBRONCHIAL ULTRASOUND;  Surgeon: Garner Nash, DO;  Location: [place];  Service: Pulmonary;;    SOCIAL HISTORY:  Social History   Socioeconomic History   Marital status: Married    Spouse name: [name]   Number of children: 0   Years of education: Not on file   Highest education level: Not on file  Occupational History   Occupation: Disabled    Comment: previously worked in Nurse, learning disability as an Clinical biochemist  Tobacco Use   Smoking status: Former    Packs/day: 3.00    Years: 32.00    Pack years: 96.00    Types: Cigarettes    Start date: 08/29/1977    Quit date: 11/29/2006    Years since quitting: 14.2   Smokeless tobacco: Former    Types: Chew    Quit date: 2003   Tobacco comments:    11/05/2012 "chewed tobacco when I play ball; haven't chewed since age 29"  Vaping Use   Vaping Use: Never used  Substance and Sexual Activity   Alcohol use: Not Currently    Alcohol/week: 12.0 standard drinks    Types: 12 Cans of beer per week    Comment: per wife [name] 6-7/week now as of 02/24/19   Drug use: Not Currently    Types: Marijuana    Comment: Last use was on - denies on 04/08/19   Sexual activity: Not on file  Other Topics Concern   Not on file  Social History Narrative   Lives with wife at home   Right Handed   Drinks 5-6 cups caffeine daily   Social Determinants of Health   Financial Resource Strain: Not on file  Food Insecurity: Not on file  Transportation Needs: Not on file  Physical Activity: Not on file  Stress: Not on file  Social Connections: Not on file  Intimate Partner Violence: Not on file    FAMILY HISTORY:  Family History  Problem Relation Age of  Onset   Hypertension Mother    Heart disease Father    Hypertension Father    Heart disease Sister    Hypertension Maternal Grandmother    Hypertension Maternal Grandfather    Hypertension Paternal Grandmother    Hypertension Paternal Grandfather    Pancreatic cancer Maternal Uncle    Breast cancer Maternal Aunt    Breast cancer Maternal Aunt    Diabetes Paternal Uncle    Diabetes Paternal Aunt     CURRENT MEDICATIONS:  Current Outpatient Medications  Medication Sig Dispense Refill   acetaminophen (TYLENOL) 325 MG tablet Take 1.5 tablets (487.5 mg total) by mouth every 6 (six) hours as needed for mild pain, fever or headache (or Fever >/= 101).     aspirin EC 81 MG tablet Take 1 tablet (81 mg total) by mouth daily with breakfast. 30 tablet 4   atorvastatin (LIPITOR) 40 MG tablet Take 80 mg by mouth at bedtime.  diazepam (VALIUM) 10 MG tablet Take 10 mg by mouth 3 (three) times daily as needed.     dronabinol (MARINOL) 5 MG capsule TAKE 1 CAPSULE(5 MG) BY MOUTH TWICE DAILY BEFORE A MEAL 60 capsule 5   DULoxetine (CYMBALTA) 60 MG capsule Take 60 mg by mouth daily.     HYDROcodone-acetaminophen (NORCO) 10-325 MG tablet Take 1 tablet by mouth every 4 (four) hours as needed. 50 tablet 0   lactulose (CHRONULAC) 10 GM/15ML solution Take 30 mLs by mouth 4 (four) times daily as needed.     metoprolol tartrate (LOPRESSOR) 25 MG tablet Take 12.5 mg by mouth 2 (two) times daily.     omeprazole (PRILOSEC) 40 MG capsule Take 40 mg by mouth 2 (two) times daily.     potassium chloride SA (KLOR-CON) 20 MEQ tablet Take 20 mEq by mouth daily.     prochlorperazine (COMPAZINE) 10 MG tablet TAKE 1 TABLET(10 MG) BY MOUTH EVERY 6 HOURS AS NEEDED FOR NAUSEA 60 tablet 2   valproic acid (DEPAKENE) 250 MG/5ML solution TAKE 10 ML BY MOUTH THREE TIMES DAILY 900 mL 5   nitroGLYCERIN (NITROSTAT) 0.4 MG SL tablet Place 1 tablet (0.4 mg total) under the tongue every 5 (five) minutes as needed for chest pain (CP or  SOB). (Patient not taking: Reported on 02/09/2021) 25 tablet 3   No current facility-administered medications for this visit.    ALLERGIES:  Allergies  Allergen Reactions   Cymbalta [Duloxetine Hcl]    Duloxetine Other (See Comments)    Caused depression/ patient is taking   Prednisone Other (See Comments)    Chest pain    PHYSICAL EXAM:  Performance status (ECOG): 1 - Symptomatic but completely ambulatory  Vitals:   02/09/21 1133  BP: 112/81  Pulse: 64  Resp: 16  Temp: 98.4 F (36.9 C)  SpO2: 90%   Wt Readings from Last 3 Encounters:  02/09/21 161 lb 3.2 oz (73.1 kg)  01/19/21 161 lb 8 oz (73.3 kg)  11/03/20 151 lb (68.5 kg)   Physical Exam Vitals reviewed.  Constitutional:      Appearance: Normal appearance.     Comments: In wheelchair  Cardiovascular:     Rate and Rhythm: Normal rate and regular rhythm.     Pulses: Normal pulses.     Heart sounds: Normal heart sounds.  Pulmonary:     Effort: Pulmonary effort is normal.     Breath sounds: Normal breath sounds.  Lymphadenopathy:     Cervical: No cervical adenopathy.     Right cervical: No superficial, deep or posterior cervical adenopathy.    Left cervical: No superficial, deep or posterior cervical adenopathy.     Upper Body:     Right upper body: No supraclavicular adenopathy.     Left upper body: No supraclavicular adenopathy.  Neurological:     General: No focal deficit present.     Mental Status: He is alert and oriented to person, place, and time.  Psychiatric:        Mood and Affect: Mood normal.        Behavior: Behavior normal.     LABORATORY DATA:  I have reviewed the labs as listed.  CBC Latest Ref Rng & Units 01/18/2021 08/17/2020 07/26/2020  WBC 4.0 - 10.5 K/uL 7.2 4.0 -  Hemoglobin 13.0 - 17.0 g/dL 12.9(L) 11.6(L) 12.2(L)  Hematocrit 39.0 - 52.0 % 39.0 36.0(L) 36.0(L)  Platelets 150 - 400 K/uL 185 176 -   CMP Latest Ref Rng &  Units 01/18/2021 01/18/2021 08/17/2020  Glucose 70 - 99 mg/dL 91 -  78  BUN 6 - 20 mg/dL 12 - 25(H)  Creatinine 0.61 - 1.24 mg/dL 0.84 1.10 0.71  Sodium 135 - 145 mmol/L 127(L) - 129(L)  Potassium 3.5 - 5.1 mmol/L 4.2 - 4.1  Chloride 98 - 111 mmol/L 91(L) - 91(L)  CO2 22 - 32 mmol/L 30 - 32  Calcium 8.9 - 10.3 mg/dL 8.7(L) - 8.8(L)  Total Protein 6.5 - 8.1 g/dL 7.0 - 7.0  Total Bilirubin 0.3 - 1.2 mg/dL 0.4 - 0.5  Alkaline Phos 38 - 126 U/L 82 - 42  AST 15 - 41 U/L 31 - 14(L)  ALT 0 - 44 U/L 22 - 10    DIAGNOSTIC IMAGING:  I have independently reviewed the scans and discussed with the patient. CT Soft Tissue Neck W Contrast  Result Date: 01/18/2021 CLINICAL DATA:  History of supraglottic squamous cell carcinoma EXAM: CT NECK WITH CONTRAST TECHNIQUE: Multidetector CT imaging of the neck was performed using the standard protocol following the bolus administration of intravenous contrast. RADIATION DOSE REDUCTION: This exam was performed according to the departmental dose-optimization program which includes automated exposure control, adjustment of the mA and/or kV according to patient size and/or use of iterative reconstruction technique. CONTRAST:  163mL OMNIPAQUE IOHEXOL 300 MG/ML  SOLN COMPARISON:  Multiple prior CT neck is most recently 05/17/2020, PET-CT 07/02/2020. FINDINGS: Pharynx and larynx: The nasal cavity and nasopharynx are normal. The oral cavity and oropharynx are normal. The parapharyngeal spaces are clear. No abnormal soft tissue mass is seen in the hypopharynx or larynx. The vocal folds are normal in appearance. There is no evidence of local recurrence. Salivary glands: The parotid glands are unremarkable. Posttreatment atrophy of the submandibular glands is unchanged. Thyroid: Unremarkable. Lymph nodes: There is no pathologic lymphadenopathy in the neck. Vascular: Bulky calcified atherosclerotic plaque is again noted in the bilateral carotid bulbs, better assessed on dedicated prior CTAs. The internal jugular veins are patent, right larger than  left. 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 Limited intracranial: The partially imaged intracranial compartment is unremarkable. Visualized orbits: The imaged globes and orbits are unremarkable. Mastoids and visualized paranasal sinuses: The imaged paranasal sinuses are clear. The imaged mastoid air cells are clear. Skeleton: There is mild degenerative change of the cervical spine at C5-C6 and C6-C7. There is no acute osseous abnormality or aggressive osseous lesion. Upper chest: The lungs are assessed on the separately dictated CT chest. Other: There is mild stranding in the subcutaneous fat of the right submandibular region, similar to the prior study, which may reflect postradiation change IMPRESSION: Stable posttreatment changes in the neck as above without evidence of residual or recurrent tumor. No pathologic lymphadenopathy in the neck. Electronically Signed   By: Valetta Mole M.D.   On: 01/18/2021 16:55   CT Chest W Contrast  Result Date: 01/18/2021 CLINICAL DATA:  Supraglottic head and neck cancer, right upper lobe lung cancer, status post chemotherapy and radiation EXAM: CT CHEST WITH CONTRAST TECHNIQUE: Multidetector CT imaging of the chest was performed during intravenous contrast administration. RADIATION DOSE REDUCTION: This exam was performed according to the departmental dose-optimization program which includes automated exposure control, adjustment of the mA and/or kV according to patient size and/or use of iterative reconstruction technique. CONTRAST:  17mL OMNIPAQUE IOHEXOL 300 MG/ML  SOLN COMPARISON:  07/23/2020 FINDINGS: Cardiovascular: Aortic atherosclerosis. Cardiomegaly. Extensive 3 vessel coronary artery calcifications and stents. No pericardial effusion. Mediastinum/Nodes: Unchanged prominent subcentimeter  mediastinal and bilateral hilar lymph nodes. Thyroid gland, trachea, and esophagus demonstrate no significant findings. Lungs/Pleura: Moderate centrilobular and  paraseptal emphysema. Diffuse bilateral bronchial wall thickening. Complete interval resolution of a previously seen lobulated, partially cavitary nodule of the medial right upper lobe, with a biopsy marking clip in this vicinity and new adjacent ground-glass and irregular interstitial opacity (series 505, image 46). There are new adjacent irregular nodules in the posterior right apex, measuring 0.6 cm (series 505, image 41) and 0.4 cm (series 505, image 39). Significant interval increase in bandlike scarring, atelectasis, and/or fibrosis in the dependent bilateral lung bases. There is a biopsy marking clip in the dependent left lobe with new, bandlike consolidation in this vicinity (series 505, image 113). A previously noted nodule can not be clearly distinguished within this consolidation. No pleural effusion or pneumothorax. Upper Abdomen: No acute abnormality. Musculoskeletal: No chest wall abnormality. No suspicious osseous lesions identified. IMPRESSION: 1. Complete interval resolution of a previously seen lobulated, partially cavitary nodule of the medial right upper lobe, with a biopsy marking clip in this vicinity and new adjacent ground-glass and irregular interstitial opacity. 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 2. There are new adjacent irregular nodules in the posterior right apex, measuring 0.6 cm and 0.4 cm. These are nonspecific, possibly infectious or inflammatory in nature, modestly suspicious for metastases. Attention on follow-up. 3. There is a biopsy marking clip in the dependent left lobe at the site of a previously seen FDG avid nodule with new, bandlike consolidation in this vicinity. The nodule can not be clearly distinguished within this consolidation and may have resolved. Findings are generally consistent with treatment response and developing radiation pneumonitis and fibrosis as above. Attention on follow-up. 4. Significant  interval increase in bandlike scarring, atelectasis, and/or fibrosis in the dependent bilateral lung bases, which may be related to interval infection or aspiration. 5. Emphysema and diffuse bilateral bronchial wall thickening. 6. Coronary artery disease. Aortic Atherosclerosis (ICD10-I70.0) and Emphysema (ICD10-J43.9). Electronically Signed   By: Delanna Ahmadi M.D.   On: 01/18/2021 18:59     ASSESSMENT:  1.  T3N2C supraglottic squamous cell carcinoma: -Laryngoscopy and biopsy on 01/02/2019, trach placed on 02/18/2019. -PET scan on 01/27/2019 showed hypermetabolic laryngeal lesion with hypermetabolic cervical metastatic disease bilaterally. -Weekly cisplatin and radiation started on 03/07/2019, many interruptions due to hospitalizations. -XRT completed on 05/08/2019 and last weekly cisplatin on 05/15/2019. -CT angio chest on 10/20/2019 showed 6 mm right upper lobe lung nodule increased in size from prior PET scan.  Resolved subpleural nodules laterally in the right upper lobe.  Subcentimeter hyperenhancing liver lesions. -PET scan on 11/13/2059 showed moderate hypermetabolism noted in the prior tracheostomy tract, likely granulation tissue.  No lymphadenopathy in the neck.  6 mm right upper lobe nodule is mildly hypermetabolic with SUV 5.10.  5 mm left axillary lymph node with SUV 3.49.  6.5 mm precarinal lymph node slightly enlarged from 5 mm.  Difficult to measure bilateral hilar lymph nodes are hypermetabolic.   2.  Nutrition: -He has a PEG tube.  He is using Osmolite 1.53 to 4 cans/day.   3.  CVA: -She had pontine stroke with large vessel occlusion. -He had bleeding from trach site while on Plavix which was held since 03/04/2019.   PLAN:  1.  Supraglottic squamous cell carcinoma: - Reviewed CT soft tissue neck from 01/18/2021 which showed posttreatment changes with no pathologic adenopathy. - Physical exam was also normal. - Continue follow-ups with Dr.  Tiu. - Reviewed labs today which shows TSH of  5.5.  LFTs and CBC are grossly within normal limits. - RTC 6 months with repeat CT of the neck with contrast.     2.  Non-small cell lung cancer: - Status post SBRT of the right upper lobe and left lower lobe lung lesions on 08/21/2020. - CT chest with contrast on 01/18/2021 showed good response. - Dr. Isidore Moos is arranging follow-up scan in 3 months.   Orders placed this encounter:  Orders Placed This Encounter  Procedures   CT SOFT TISSUE NECK W CONTRAST     [name], MD [place] [phone]   I, Thana Ates, am acting as a scribe for Dr. [name].  I, [name] MD, have reviewed the above documentation for accuracy and completeness, and I agree with the above.

## 2021-02-09 NOTE — Patient Instructions (Signed)
[place] at [facility] Discharge Instructions   You were seen and examined today by Dr. Delton Coombes.  He reviewed the results of your labs and scans which are normal/stable.  Return as scheduled in 6 months.    Thank you for choosing [place] at [facility] to provide your oncology and hematology care.  To afford each patient quality time with our provider, please arrive at least 15 minutes before your scheduled appointment time.   If you have a lab appointment with the [facility] please come in thru the Main Entrance and check in at the main information desk.  You need to re-schedule your appointment should you arrive 10 or more minutes late.  We strive to give you quality time with our providers, and arriving late affects you and other patients whose appointments are after yours.  Also, if you no show three or more times for appointments you may be dismissed from the clinic at the providers discretion.     Again, thank you for choosing [facility].  Our hope is that these requests will decrease the amount of time that you wait before being seen by our physicians.       _____________________________________________________________  Should you have questions after your visit to [facility], please contact our office at [phone] and follow the prompts.  Our office hours are 8:00 a.m. and 4:30 p.m. Monday - Friday.  Please note that voicemails left after 4:00 p.m. may not be returned until the following business day.  We are closed weekends and major holidays.  You do have access to a nurse 24-7, just call the main number to the clinic [phone] and do not press any options, hold on the line and a nurse will answer the phone.    For prescription refill requests, have your pharmacy contact our office and allow 72 hours.    Due to Covid, you will need to wear a mask upon entering the hospital. If you do not  have a mask, a mask will be given to you at the Main Entrance upon arrival. For doctor visits, patients may have 1 support person age 57 or older with them. For treatment visits, patients can not have anyone with them due to social distancing guidelines and our immunocompromised population.

## 2021-02-10 ENCOUNTER — Telehealth: Payer: Self-pay

## 2021-02-10 NOTE — Telephone Encounter (Signed)
206 pm.  Phone call made to PCP office.  Advised patient will be discharged from services as we have been unable to connect with patient.

## 2021-02-14 ENCOUNTER — Encounter (HOSPITAL_COMMUNITY): Payer: Self-pay | Admitting: Emergency Medicine

## 2021-02-14 ENCOUNTER — Other Ambulatory Visit (HOSPITAL_COMMUNITY): Payer: Self-pay | Admitting: *Deleted

## 2021-02-14 NOTE — Progress Notes (Signed)
PA approved through [employer] until 08/14/2021 for dronabinol 5 mg.

## 2021-02-19 ENCOUNTER — Other Ambulatory Visit: Payer: Self-pay | Admitting: Gastroenterology

## 2021-02-21 ENCOUNTER — Other Ambulatory Visit: Payer: Self-pay | Admitting: Nurse Practitioner

## 2021-02-28 DIAGNOSIS — Z8521 Personal history of malignant neoplasm of larynx: Secondary | ICD-10-CM | POA: Diagnosis not present

## 2021-03-08 ENCOUNTER — Encounter (HOSPITAL_COMMUNITY): Payer: Self-pay | Admitting: Hematology

## 2021-03-10 DIAGNOSIS — L03317 Cellulitis of buttock: Secondary | ICD-10-CM | POA: Diagnosis not present

## 2021-03-10 DIAGNOSIS — I10 Essential (primary) hypertension: Secondary | ICD-10-CM | POA: Diagnosis not present

## 2021-03-10 DIAGNOSIS — Z6822 Body mass index (BMI) 22.0-22.9, adult: Secondary | ICD-10-CM | POA: Diagnosis not present

## 2021-03-10 DIAGNOSIS — L0233 Carbuncle of buttock: Secondary | ICD-10-CM | POA: Diagnosis not present

## 2021-03-10 DIAGNOSIS — M545 Low back pain, unspecified: Secondary | ICD-10-CM | POA: Diagnosis not present

## 2021-03-10 DIAGNOSIS — G894 Chronic pain syndrome: Secondary | ICD-10-CM | POA: Diagnosis not present

## 2021-03-21 ENCOUNTER — Other Ambulatory Visit: Payer: Self-pay | Admitting: Nurse Practitioner

## 2021-03-23 ENCOUNTER — Encounter: Payer: Self-pay | Admitting: Adult Health

## 2021-03-23 ENCOUNTER — Other Ambulatory Visit: Payer: Self-pay

## 2021-03-23 ENCOUNTER — Telehealth: Payer: Self-pay | Admitting: *Deleted

## 2021-03-23 ENCOUNTER — Ambulatory Visit: Payer: Medicare Other | Admitting: Adult Health

## 2021-03-23 VITALS — BP 127/74 | HR 66 | Ht 72.0 in

## 2021-03-23 DIAGNOSIS — Z8673 Personal history of transient ischemic attack (TIA), and cerebral infarction without residual deficits: Secondary | ICD-10-CM

## 2021-03-23 DIAGNOSIS — R4020 Unspecified coma: Secondary | ICD-10-CM | POA: Diagnosis not present

## 2021-03-23 DIAGNOSIS — Z9189 Other specified personal risk factors, not elsewhere classified: Secondary | ICD-10-CM | POA: Diagnosis not present

## 2021-03-23 DIAGNOSIS — R2689 Other abnormalities of gait and mobility: Secondary | ICD-10-CM

## 2021-03-23 DIAGNOSIS — M542 Cervicalgia: Secondary | ICD-10-CM | POA: Diagnosis not present

## 2021-03-23 DIAGNOSIS — R5381 Other malaise: Secondary | ICD-10-CM

## 2021-03-23 MED ORDER — BACLOFEN 5 MG PO TABS: 5.0000 mg | ORAL_TABLET | Freq: Every evening | ORAL | 5 refills | Status: DC

## 2021-03-23 NOTE — Progress Notes (Signed)
?[facility] ?[address] ?[name]. [place] [address] ?[phone] ? ?     OFFICE FOLLOW UP NOTE ? ?[name] ?Date of Birth:  December 06, 1960 ?Medical Record Number:  [id number]  ? ?Referring MD:  [name] ? ?Reason for Referral:  Syncope versus seizure ? ? ?Chief Complaint  ?Patient presents with  ? History of Stroke  ?  Rm 2, 6 month FU, wife- [name] "when will he be put back on Plavix blood thinner; having headaches- takes Tylenol"  ?  ? ? ?HPI:  ? ?Update 03/23/2021 JM: Patient returns for follow-up visit after prior visit approximately 5 months ago.  Overall stable from neurological standpoint.  No additional syncopal events, remains on Depakote without side effects.  No new or reoccurring stroke/TIA symptoms. Compliant on aspirin and atorvastatin, denies side effects.  Blood pressure today 127/74.   ? ?Wife mentions he has been having headaches over the past 3 to 4 months, typically present upon awakening and at times can wake in the middle of the night. Located back of head, ache sensation, photophobia present, no phonophobia or N/V. Had used tylenol with some benefit. Has not tried any heat or ice. Wife does report that he sleeps a lot but sleep cycle is off, he will be up on and off throughout the night and sleep more during the day. Wife reports snoring and witnessed apneas.  He has not previously had sleep study.  ? ?Wife questions participating in physical therapy due to generalized deconditioning, being sedentary and limited physical activity or exercise.  He does ambulate short distance with cane, no recent falls. ? ?Continues to closely follow with oncology for supraglottic squamous cell carcinoma currently in remission ? ? ? ? ? ?History provided for reference purposes only ?Update 10/06/2020 JM: Returns for 70-month follow-up accompanied by his wife, [name].  Overall doing well from a neurological standpoint.  Remains on Depakote without any additional seizure activity  or loss of consciousness and tolerating without side effects.  Denies new or reoccurring stroke/TIA symptoms.  Compliant on aspirin and atorvastatin without side effects.  Blood pressure today 116/74.  Recent carotid ultrasound showed R ICA 60-79% stenosis and L ICA 1-39% routinely monitored by Dr. Estanislado Pandy.  Wife is concerned regarding LE color changes (turns purpose per wife) after sitting for any length of time. Recently stubbed his toe on the side of the bed - wife concerned regarding possible infection. Unable to be seen by PCP until 10/17 which is a VV but wife concerned he needs to be seen before then.  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  Routinely followed by oncology and pulmonology.  No further concerns at this time. ? ? ?Update 07/21/2020 JM: [name] returns for 61-month follow-up accompanied by his wife, [name], after prior consult visit with Dr. [name] for evaluation of loss of consciousness episodes possibly in setting syncope vs seizures.  MR brain did not showed stable appearance compared to prior imaging in 07/2018.  EEG showed mild slowing but no seizure activity.  He was started on valproic acid for possible seizures which he has remained on and has not had any additional loss of consciousness episodes.  He also reports resolution of diarrhea and improvement of appetite. ? ?Stable from stroke standpoint without new stroke/TIA symptoms. Compliant on  aspirin and atorvastatin without associated side effects. Blood pressure today 91/61. Ambulates without assistive device indoors normally but occasionally will use a rolling walker. Use of w/c long distance.  ? ?History of supraglottic head and neck cancer s/p radiation and chemotherapy with recent CT showing enlarging lung nodule  concerning for either mets vs primary lung.  He plans on undergoing bronchoscopy currently scheduled 7/25 for further diagnosis.  He routinely follows with oncology and pulmonology. ? ?Consult visit 05/06/2020 Dr. [name]: [name] is a 61 year old Caucasian male is referred today for evaluation for episodes of brief loss of consciousness.  History is obtained from the patient and his wife and daughter eyewitness as well as review of electronic medical records and personally reviewed pertinent imaging films in PACS.  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  He has had multiple recent hospitalizations mostly related to dehydration and diarrhea and several ER visits.  The patient and wife however report that he has had multiple episodes of brief loss of consciousness.  Episodes are stereotypical with the patient's turning his neck upwards with eyes rolling back the patient been briefly unresponsive.  He usually complains of eyes being fuzzy and needing to sit down.  At times he can hold on and make it other times he just falls down and needs to be caught otherwise he will hit himself.  Episodes of brief and regains consciousness quickly.  Patient however does not remember the episode slated for a while though is not confused or disoriented.  There have been no witnessed tonic-clonic activity, tongue bite or incontinence.  This episode started in 2020 settle down after this for a few years but in the last few months episodes seem to have occurred again and now occur at variable frequency from several a day to once every few days.  Patient underwent a CT angiogram of the brain and neck on one of the recent ER admissions on 04/30/2020 which showed 60% stenosis of the right internal carotid artery at the bulb and 60% stenosis of left ICA at the skull base as well  as moderate 50% bilateral carotid siphon stenosis.  Both vertebral arteries were occluded beyond the origin of the PICA.  These findings were not significantly worse compared with previous CT angiogram from 03/04/2019.   ?He has remote history of bilateral pontine strokes in July 2020 when MRI showed R > L pontine infarcts.  MRA showed occlusion bilateral VA's.  CTA head/neck showed bilateral V4 stenosis with occlusion distal VA and BA distally, right ICA 70% and left ICA 65% narrowing, distal left ICA 60% at cervical petrous junction, and severe ICA atherosclerosis, left CCA 40 to 50% stenosis, right VA 50% stenosis, bilateral V2 60 to 70% stenosis and left subclavian 65% stenosis.  Underwent cerebral angiogram by Dr. 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  No further interventions recommended at that time.  He also has history ofHeavy EtOH use and substance use with cessation counseling provided.  Other stroke risk factors include former tobacco use, family history of stroke and CAD s/p DES 2014.    Patient has not been evaluated for seizures with recent EEG or brain imaging studies.  He denies prior history of seizures.  He has an appointment with a gastroenterologist in a few weeks and with his cancer doctor as well to discuss  more treatment for his what appears to be recurrent cancer. ? ? ? ?ROS:   ?14 system review of systems is positive for those listed in HPI and other systems negative ? ?PMH:  ?Past Medical History:  ?Diagnosis Date  ? Anemia   ? Anxiety   ? Arthritis   ? Cancer of larynx ([place])   ? Carotid artery stenosis   ? Chronic lower back pain   ? Coronary artery disease   ? a. Diag cath 10/2012 for CP/abnormal nuc -> PCI s/p LAD atherectomy/DES placement 11/05/12.  ? Depression   ? Dilated aortic root ([place])   ? GERD (gastroesophageal reflux disease)   ? History of hiatal hernia   ? Hypercholesteremia   ? Hypertension   ?  Pneumonia   ? S/P percutaneous endoscopic gastrostomy (PEG) tube placement ([place])   ? Stroke ([place]) 07/31/2018  ? Pons  ? ? ?Social History:  ?Social History  ? ?Socioeconomic History  ? Marital status: Married

## 2021-03-23 NOTE — Telephone Encounter (Signed)
Attempted to reach wife with instructions from NP. No answer voice MB full. Will try tomorrow.  ?

## 2021-03-23 NOTE — Patient Instructions (Addendum)
We will call you regarding recommendations for neck concerns ? ?Referral placed for sleep evaluation for possible underlying sleep apnea ? ?Referral placed for home health physical therapy  ? ?Continue Depakote 500mg  three times per day  ? ?Continue aspirin 81 mg daily  and atorvastatin for secondary stroke prevention ?No indication from stroke standpoint to restart plavix at this time ? ?Continue to follow with Dr. Estanislado Pandy for carotid stenosis ? ?Continue to follow up with PCP regarding cholesterol and blood pressure management  ?Maintain strict control of hypertension with blood pressure goal below 130/90 and cholesterol with LDL cholesterol (bad cholesterol) goal below 70 mg/dL.  ? ?Signs of a Stroke? Follow the BEFAST method:  ?Balance Watch for a sudden loss of balance, trouble with coordination or vertigo ?Eyes Is there a sudden loss of vision in one or both eyes? Or double vision?  ?Face: Ask the person to smile. Does one side of the face droop or is it numb?  ?Arms: Ask the person to raise both arms. Does one arm drift downward? Is there weakness or numbness of a leg? ?Speech: Ask the person to repeat a simple phrase. Does the speech sound slurred/strange? Is the person confused ? ?Time: If you observe any of these signs, call 911. ? ? ? ? ? ? ? ?Thank you for coming to see [place] at [facility]. I hope we have been able to provide you high quality care today. ? ?You may receive a patient satisfaction survey over the next few weeks. We would appreciate your feedback and comments so that we may continue to improve ourselves and the health of our patients. ? ?

## 2021-03-24 ENCOUNTER — Telehealth: Payer: Self-pay | Admitting: Adult Health

## 2021-03-24 NOTE — Telephone Encounter (Signed)
Sent a message to Angie with [facility] to see if she is able to take the patient.  ?

## 2021-03-24 NOTE — Telephone Encounter (Signed)
Wife called back and I reviewed NP's A/P with her : ?3.  Cervical strain ?4.  Posterior headaches ?-moderate tightness of upper trapezius on left side appearing musculoskeletal in origin. No red flag symptoms or exam findings.  No radiculopathy or traumatic event ?-likely neck pain contributing to posterior headaches ?-trial baclofen 5 mg nightly - advised to call after 2 weeks if no benefit for dosage increase ?-use of heat and gentle stretching exercises ?-Discussed importance of proper positioning and posture and good pillow support while sleeping ?-referral placed for [facility] PT ?-follow up with PCP if symptoms persist ?-Referral placed to [place] sleep clinic for sleep study due to awakening with headaches, insomnia, daytime fatigue and snoring. ?Answered her questions to her stated satisfaction. She verbalized understanding, appreciation. ? ?

## 2021-03-24 NOTE — Telephone Encounter (Signed)
Attempted to reach wife again, no answer and VMB full.  ?

## 2021-03-28 NOTE — Telephone Encounter (Signed)
Angie messaged me back stating: ? ?"we were going to see today but he has asked [place] to wait til Monday- I guess he is having some housework done today and wants [place] to wait." ?

## 2021-03-29 DIAGNOSIS — I10 Essential (primary) hypertension: Secondary | ICD-10-CM | POA: Diagnosis not present

## 2021-03-29 DIAGNOSIS — C3411 Malignant neoplasm of upper lobe, right bronchus or lung: Secondary | ICD-10-CM | POA: Diagnosis not present

## 2021-03-29 DIAGNOSIS — I639 Cerebral infarction, unspecified: Secondary | ICD-10-CM | POA: Diagnosis not present

## 2021-03-29 DIAGNOSIS — R55 Syncope and collapse: Secondary | ICD-10-CM | POA: Diagnosis not present

## 2021-03-29 DIAGNOSIS — C321 Malignant neoplasm of supraglottis: Secondary | ICD-10-CM | POA: Diagnosis not present

## 2021-03-31 ENCOUNTER — Telehealth: Payer: Self-pay | Admitting: Adult Health

## 2021-03-31 NOTE — Telephone Encounter (Signed)
LVM for Monique giving verbal approval to her PT she has recommended. Left # for questions. ?

## 2021-03-31 NOTE — Telephone Encounter (Signed)
[facility]) request verbal orders for PT. ?Frequency: ?1x for 1 wk  ?2x for 4 wks, ?1x wk for 2x wks. ? ?Would like a call from the nurse. ?

## 2021-04-01 DIAGNOSIS — I10 Essential (primary) hypertension: Secondary | ICD-10-CM | POA: Diagnosis not present

## 2021-04-01 DIAGNOSIS — C3411 Malignant neoplasm of upper lobe, right bronchus or lung: Secondary | ICD-10-CM | POA: Diagnosis not present

## 2021-04-01 DIAGNOSIS — C321 Malignant neoplasm of supraglottis: Secondary | ICD-10-CM | POA: Diagnosis not present

## 2021-04-01 DIAGNOSIS — I639 Cerebral infarction, unspecified: Secondary | ICD-10-CM | POA: Diagnosis not present

## 2021-04-01 DIAGNOSIS — R55 Syncope and collapse: Secondary | ICD-10-CM | POA: Diagnosis not present

## 2021-04-04 DIAGNOSIS — C321 Malignant neoplasm of supraglottis: Secondary | ICD-10-CM | POA: Diagnosis not present

## 2021-04-04 DIAGNOSIS — C3411 Malignant neoplasm of upper lobe, right bronchus or lung: Secondary | ICD-10-CM | POA: Diagnosis not present

## 2021-04-04 DIAGNOSIS — C7802 Secondary malignant neoplasm of left lung: Secondary | ICD-10-CM | POA: Diagnosis not present

## 2021-04-04 DIAGNOSIS — I951 Orthostatic hypotension: Secondary | ICD-10-CM | POA: Diagnosis not present

## 2021-04-04 DIAGNOSIS — M542 Cervicalgia: Secondary | ICD-10-CM | POA: Diagnosis not present

## 2021-04-04 DIAGNOSIS — I10 Essential (primary) hypertension: Secondary | ICD-10-CM | POA: Diagnosis not present

## 2021-04-06 DIAGNOSIS — I951 Orthostatic hypotension: Secondary | ICD-10-CM | POA: Diagnosis not present

## 2021-04-06 DIAGNOSIS — C321 Malignant neoplasm of supraglottis: Secondary | ICD-10-CM | POA: Diagnosis not present

## 2021-04-06 DIAGNOSIS — C7802 Secondary malignant neoplasm of left lung: Secondary | ICD-10-CM | POA: Diagnosis not present

## 2021-04-06 DIAGNOSIS — I10 Essential (primary) hypertension: Secondary | ICD-10-CM | POA: Diagnosis not present

## 2021-04-06 DIAGNOSIS — C3411 Malignant neoplasm of upper lobe, right bronchus or lung: Secondary | ICD-10-CM | POA: Diagnosis not present

## 2021-04-06 DIAGNOSIS — M542 Cervicalgia: Secondary | ICD-10-CM | POA: Diagnosis not present

## 2021-04-11 ENCOUNTER — Telehealth: Payer: Self-pay | Admitting: Adult Health

## 2021-04-11 DIAGNOSIS — C3411 Malignant neoplasm of upper lobe, right bronchus or lung: Secondary | ICD-10-CM | POA: Diagnosis not present

## 2021-04-11 DIAGNOSIS — I10 Essential (primary) hypertension: Secondary | ICD-10-CM | POA: Diagnosis not present

## 2021-04-11 DIAGNOSIS — M545 Low back pain, unspecified: Secondary | ICD-10-CM | POA: Diagnosis not present

## 2021-04-11 DIAGNOSIS — M542 Cervicalgia: Secondary | ICD-10-CM | POA: Diagnosis not present

## 2021-04-11 DIAGNOSIS — I951 Orthostatic hypotension: Secondary | ICD-10-CM | POA: Diagnosis not present

## 2021-04-11 DIAGNOSIS — C7802 Secondary malignant neoplasm of left lung: Secondary | ICD-10-CM | POA: Diagnosis not present

## 2021-04-11 DIAGNOSIS — G894 Chronic pain syndrome: Secondary | ICD-10-CM | POA: Diagnosis not present

## 2021-04-11 DIAGNOSIS — C321 Malignant neoplasm of supraglottis: Secondary | ICD-10-CM | POA: Diagnosis not present

## 2021-04-11 MED ORDER — BACLOFEN 5 MG PO TABS: 10.0000 mg | ORAL_TABLET | Freq: Every evening | ORAL | 3 refills | Status: DC

## 2021-04-11 NOTE — Telephone Encounter (Signed)
Pt called to give update on baclofen. Pt is currently taking 5 mg nightly and is receiving benefit from headaches, neck pain unchanged.  ? ?Per plan from last ov may consider increasing dosage depending on feedback given from patient.  ? ?Per VO from [place] ok to increase to 10 mg nightly.  ? ?I called pt's wife and relayed message and she verbalized understanding/appreciation for the call. ?Updated order has been sent to [facility].  ?  ?

## 2021-04-11 NOTE — Telephone Encounter (Addendum)
Pt's wife states she was told to call back after pt had 2 weeks of using Baclofen 5 MG TABS,she has called to report that the medication has helped greatly with headaches but pt is still having the neck pain ?

## 2021-04-12 ENCOUNTER — Telehealth: Payer: Self-pay | Admitting: Adult Health

## 2021-04-12 NOTE — Telephone Encounter (Signed)
[name]) request verbal order for OT ?Frequency  ?1x wk for 4 wks ADL retraining, caregiver education and fall prevention. ?

## 2021-04-12 NOTE — Telephone Encounter (Signed)
I returned [name]'s call and provided verbal order as requested.  ?

## 2021-04-13 DIAGNOSIS — I951 Orthostatic hypotension: Secondary | ICD-10-CM | POA: Diagnosis not present

## 2021-04-13 DIAGNOSIS — I10 Essential (primary) hypertension: Secondary | ICD-10-CM | POA: Diagnosis not present

## 2021-04-13 DIAGNOSIS — C3411 Malignant neoplasm of upper lobe, right bronchus or lung: Secondary | ICD-10-CM | POA: Diagnosis not present

## 2021-04-13 DIAGNOSIS — C7802 Secondary malignant neoplasm of left lung: Secondary | ICD-10-CM | POA: Diagnosis not present

## 2021-04-13 DIAGNOSIS — M542 Cervicalgia: Secondary | ICD-10-CM | POA: Diagnosis not present

## 2021-04-13 DIAGNOSIS — C321 Malignant neoplasm of supraglottis: Secondary | ICD-10-CM | POA: Diagnosis not present

## 2021-04-13 NOTE — Telephone Encounter (Signed)
Called wife and informed her NP is out of office, but she may decrease back to baclofen 5 mg at bedtime. She stated that he was doing fine on 5 mg, so she will decrease it back down. She verbalized understanding, appreciation. ? ?

## 2021-04-13 NOTE — Telephone Encounter (Signed)
Wife has called to report that with the recent increase in pt's medication now he sleeps all the time.  Wife states pt is unable to engage the PT when she comes for his therapy, please call pt's wife. ?

## 2021-04-14 ENCOUNTER — Telehealth: Payer: Self-pay | Admitting: *Deleted

## 2021-04-14 NOTE — Telephone Encounter (Signed)
CALLED PATIENT TO INFORM OF CT FOR 05-03-21 - ARRIVAL TIME- 6:30 PM, NO RESTRICTIONS TO TEST, TEST TO BE @ [facility], PATIENT TO RECEIVE RESULTS FROM DR. SQUIRE ON 05-06-21 @ 2:40 PM, SPOKE WITH PATIENT'S WIFE- [name] AND SHE IS AWARE OF THESE APPTS. ?

## 2021-04-18 ENCOUNTER — Other Ambulatory Visit: Payer: Self-pay | Admitting: *Deleted

## 2021-04-18 DIAGNOSIS — M542 Cervicalgia: Secondary | ICD-10-CM | POA: Diagnosis not present

## 2021-04-18 DIAGNOSIS — C7802 Secondary malignant neoplasm of left lung: Secondary | ICD-10-CM | POA: Diagnosis not present

## 2021-04-18 DIAGNOSIS — I951 Orthostatic hypotension: Secondary | ICD-10-CM | POA: Diagnosis not present

## 2021-04-18 DIAGNOSIS — C3411 Malignant neoplasm of upper lobe, right bronchus or lung: Secondary | ICD-10-CM | POA: Diagnosis not present

## 2021-04-18 DIAGNOSIS — C321 Malignant neoplasm of supraglottis: Secondary | ICD-10-CM | POA: Diagnosis not present

## 2021-04-18 DIAGNOSIS — I10 Essential (primary) hypertension: Secondary | ICD-10-CM | POA: Diagnosis not present

## 2021-04-18 MED ORDER — VALPROIC ACID 250 MG/5ML PO SOLN: ORAL | 5 refills | Status: DC

## 2021-04-20 ENCOUNTER — Ambulatory Visit: Payer: Self-pay | Admitting: Radiation Oncology

## 2021-04-20 DIAGNOSIS — J441 Chronic obstructive pulmonary disease with (acute) exacerbation: Secondary | ICD-10-CM | POA: Diagnosis not present

## 2021-04-20 DIAGNOSIS — Z6823 Body mass index (BMI) 23.0-23.9, adult: Secondary | ICD-10-CM | POA: Diagnosis not present

## 2021-04-20 DIAGNOSIS — C321 Malignant neoplasm of supraglottis: Secondary | ICD-10-CM | POA: Diagnosis not present

## 2021-04-20 DIAGNOSIS — K219 Gastro-esophageal reflux disease without esophagitis: Secondary | ICD-10-CM | POA: Diagnosis not present

## 2021-04-25 DIAGNOSIS — C321 Malignant neoplasm of supraglottis: Secondary | ICD-10-CM | POA: Diagnosis not present

## 2021-04-25 DIAGNOSIS — I10 Essential (primary) hypertension: Secondary | ICD-10-CM | POA: Diagnosis not present

## 2021-04-25 DIAGNOSIS — C3411 Malignant neoplasm of upper lobe, right bronchus or lung: Secondary | ICD-10-CM | POA: Diagnosis not present

## 2021-04-25 DIAGNOSIS — I951 Orthostatic hypotension: Secondary | ICD-10-CM | POA: Diagnosis not present

## 2021-04-25 DIAGNOSIS — C7802 Secondary malignant neoplasm of left lung: Secondary | ICD-10-CM | POA: Diagnosis not present

## 2021-04-25 DIAGNOSIS — M542 Cervicalgia: Secondary | ICD-10-CM | POA: Diagnosis not present

## 2021-04-27 DIAGNOSIS — C321 Malignant neoplasm of supraglottis: Secondary | ICD-10-CM | POA: Diagnosis not present

## 2021-04-27 DIAGNOSIS — I951 Orthostatic hypotension: Secondary | ICD-10-CM | POA: Diagnosis not present

## 2021-04-27 DIAGNOSIS — I10 Essential (primary) hypertension: Secondary | ICD-10-CM | POA: Diagnosis not present

## 2021-04-27 DIAGNOSIS — C7802 Secondary malignant neoplasm of left lung: Secondary | ICD-10-CM | POA: Diagnosis not present

## 2021-04-27 DIAGNOSIS — M542 Cervicalgia: Secondary | ICD-10-CM | POA: Diagnosis not present

## 2021-04-27 DIAGNOSIS — C3411 Malignant neoplasm of upper lobe, right bronchus or lung: Secondary | ICD-10-CM | POA: Diagnosis not present

## 2021-04-28 DIAGNOSIS — I10 Essential (primary) hypertension: Secondary | ICD-10-CM | POA: Diagnosis not present

## 2021-04-28 DIAGNOSIS — C3411 Malignant neoplasm of upper lobe, right bronchus or lung: Secondary | ICD-10-CM | POA: Diagnosis not present

## 2021-04-28 DIAGNOSIS — C321 Malignant neoplasm of supraglottis: Secondary | ICD-10-CM | POA: Diagnosis not present

## 2021-04-28 DIAGNOSIS — M542 Cervicalgia: Secondary | ICD-10-CM | POA: Diagnosis not present

## 2021-04-28 DIAGNOSIS — I951 Orthostatic hypotension: Secondary | ICD-10-CM | POA: Diagnosis not present

## 2021-04-28 DIAGNOSIS — C7802 Secondary malignant neoplasm of left lung: Secondary | ICD-10-CM | POA: Diagnosis not present

## 2021-05-03 ENCOUNTER — Ambulatory Visit (HOSPITAL_COMMUNITY)
Admission: RE | Admit: 2021-05-03 | Discharge: 2021-05-03 | Disposition: A | Discharge status: Home / Self Care | Payer: Medicare Other | Source: Ambulatory Visit | Attending: Radiation Oncology | Admitting: Radiation Oncology

## 2021-05-03 DIAGNOSIS — C3490 Malignant neoplasm of unspecified part of unspecified bronchus or lung: Secondary | ICD-10-CM | POA: Diagnosis not present

## 2021-05-03 DIAGNOSIS — C7802 Secondary malignant neoplasm of left lung: Secondary | ICD-10-CM | POA: Insufficient documentation

## 2021-05-03 DIAGNOSIS — C3411 Malignant neoplasm of upper lobe, right bronchus or lung: Secondary | ICD-10-CM | POA: Insufficient documentation

## 2021-05-04 DIAGNOSIS — C7802 Secondary malignant neoplasm of left lung: Secondary | ICD-10-CM | POA: Diagnosis not present

## 2021-05-04 DIAGNOSIS — I951 Orthostatic hypotension: Secondary | ICD-10-CM | POA: Diagnosis not present

## 2021-05-04 DIAGNOSIS — C3411 Malignant neoplasm of upper lobe, right bronchus or lung: Secondary | ICD-10-CM | POA: Diagnosis not present

## 2021-05-04 DIAGNOSIS — I10 Essential (primary) hypertension: Secondary | ICD-10-CM | POA: Diagnosis not present

## 2021-05-04 DIAGNOSIS — M542 Cervicalgia: Secondary | ICD-10-CM | POA: Diagnosis not present

## 2021-05-04 DIAGNOSIS — C321 Malignant neoplasm of supraglottis: Secondary | ICD-10-CM | POA: Diagnosis not present

## 2021-05-06 ENCOUNTER — Ambulatory Visit
Admission: RE | Admit: 2021-05-06 | Discharge: 2021-05-06 | Disposition: A | Discharge status: Home / Self Care | Payer: Medicare Other | Source: Ambulatory Visit | Attending: Radiation Oncology | Admitting: Radiation Oncology

## 2021-05-06 ENCOUNTER — Other Ambulatory Visit: Payer: Self-pay

## 2021-05-06 ENCOUNTER — Encounter: Payer: Self-pay | Admitting: Radiation Oncology

## 2021-05-06 VITALS — BP 125/86 | HR 51 | Temp 97.8°F | Resp 20 | Ht 72.0 in | Wt 170.4 lb

## 2021-05-06 DIAGNOSIS — C3411 Malignant neoplasm of upper lobe, right bronchus or lung: Secondary | ICD-10-CM | POA: Insufficient documentation

## 2021-05-06 DIAGNOSIS — C321 Malignant neoplasm of supraglottis: Secondary | ICD-10-CM | POA: Diagnosis not present

## 2021-05-06 DIAGNOSIS — Z79899 Other long term (current) drug therapy: Secondary | ICD-10-CM | POA: Diagnosis not present

## 2021-05-06 DIAGNOSIS — C7802 Secondary malignant neoplasm of left lung: Secondary | ICD-10-CM | POA: Insufficient documentation

## 2021-05-06 DIAGNOSIS — Z923 Personal history of irradiation: Secondary | ICD-10-CM | POA: Diagnosis not present

## 2021-05-06 DIAGNOSIS — Z7982 Long term (current) use of aspirin: Secondary | ICD-10-CM | POA: Insufficient documentation

## 2021-05-06 DIAGNOSIS — J432 Centrilobular emphysema: Secondary | ICD-10-CM | POA: Insufficient documentation

## 2021-05-06 DIAGNOSIS — R946 Abnormal results of thyroid function studies: Secondary | ICD-10-CM | POA: Insufficient documentation

## 2021-05-06 DIAGNOSIS — R918 Other nonspecific abnormal finding of lung field: Secondary | ICD-10-CM

## 2021-05-06 NOTE — Progress Notes (Signed)
Mr. Bauernfeind presents today for follow-up after completing SBRT treatment to his right upper lung and left lower lung on 08/20/2020, and to review  CT scan results from 05/03/2021; (also completed radiation to his larynx on 05/08/2019) ? ?Pain issues, if any: Reports bottom of his feet have become painful/sensitive. Continues to deal with chronic lower back pain ?Using a feeding tube?: N/A ?Weight changes, if any: ?Wt Readings from Last 3 Encounters:  ?05/06/21 170 lb 6.4 oz (77.3 kg)  ?02/09/21 161 lb 3.2 oz (73.1 kg)  ?01/19/21 161 lb 8 oz (73.3 kg)  ? ?Swallowing issues, if any: Denies any issues and reports a healthy appetite ?Respiratory concerns: Denies any new or worsening symptoms ?Smoking or chewing tobacco? Patient denies ?Using fluoride trays daily? N/A--has full set of dentures ?Last ENT visit was on: Reports he's scheduled to see Dr. [name] on 08/30/2021 ?Other notable issues, if any: Reports he's not able to have a BM without use of laxative (has a BM about every 2 days). Reports his feet have started swelling since he took an antibiotic to prevent developing pneumonia. Scheduled for F/U with his medical oncologist Dr. Delton Coombes in August ?

## 2021-05-06 NOTE — Progress Notes (Signed)
?Radiation Oncology         [phone] ?________________________________ ? ?Name: JOHANAN SKORUPSKI MRN: [id number]  ?Date: 05/06/2021  DOB: [birth year] ? ?Follow-Up Visit Note ? ?Outpatient ? ?CC: [name], MD  [name], MD ? ?Diagnosis and Prior Radiotherapy:  ?  ICD-10-CM   ?1. Malignant neoplasm of supraglottis (HCC)  C32.1   ?  ?2. Lung nodules  R91.8   ?  ?3. 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  ? ? ?Radiation Treatment Dates: 08/10/2020 through 08/20/2020 ?Site Technique Total Dose (Gy) Dose per Fx (Gy) Completed Fx Beam Energies  ?Lung, Right: Lung_Rt_upper IMRT 50/50 10 5/5 6XFFF  ?Lung, Left: Lung_Lt_lower IMRT 50/50 10 5/5 6XFFF  ? ?Radiation Treatment Dates: 03/05/2019 through 05/08/2019 ?Site Technique Total Dose (Gy) Dose per Fx (Gy) Completed Fx Beam Energies  ?Larynx: HN_larynx IMRT 70/70 2 35/35 6X  ? ?CHIEF COMPLAINT: Here for follow-up and surveillance of lung and throat cancer ? ?Narrative:    Mr. Portman presents today for follow-up after completing SBRT treatment to his right upper lung and left lower lung on 08/20/2020, and to review  CT scan results from 05/03/2021; (also completed radiation to his larynx on 05/08/2019) ? ?Pain issues, if any: Reports bottom of his feet have become painful/sensitive. Continues to deal with chronic lower back pain ?Using a feeding tube?: N/A ?Weight changes, if any: ?Wt Readings from Last 3 Encounters:  ?05/06/21 170 lb 6.4 oz (77.3 kg)  ?02/09/21 161 lb 3.2 oz (73.1 kg)  ?01/19/21 161 lb 8 oz (73.3 kg)  ? ?Swallowing issues, if any: Denies any issues and reports a healthy appetite ?Respiratory concerns: Denies any new or worsening symptoms ?Smoking or chewing tobacco? Patient denies ?Using fluoride trays daily? N/A--has full set of dentures ?Last ENT visit was on: Reports he's scheduled to see Dr. [name] on 08/30/2021 ?Other notable issues, if any: Reports he's not able to have a BM without use of laxative (has a BM about  every 2 days). Reports his feet have started swelling since he took an antibiotic to prevent developing pneumonia. Scheduled for F/U with his medical oncologist Dr. Delton Coombes in August ? ? ?ALLERGIES:  is allergic to cymbalta [duloxetine hcl], duloxetine, and prednisone. ? ?Meds: ?Current Outpatient Medications  ?Medication Sig Dispense Refill  ? acetaminophen (TYLENOL) 325 MG tablet Take 1.5 tablets (487.5 mg total) by mouth every 6 (six) hours as needed for mild pain, fever or headache (or Fever >/= 101).    ? aspirin EC 81 MG tablet Take 1 tablet (81 mg total) by mouth daily with breakfast. 30 tablet 4  ? atorvastatin (LIPITOR) 40 MG tablet Take 80 mg by mouth at bedtime.    ? Baclofen 5 MG TABS Take 10 mg by mouth at bedtime. 60 tablet 3  ? diazepam (VALIUM) 10 MG tablet Take 10 mg by mouth 3 (three) times daily as needed.    ? dronabinol (MARINOL) 5 MG capsule TAKE 1 CAPSULE(5 MG) BY MOUTH TWICE DAILY BEFORE A MEAL 60 capsule 5  ? DULoxetine (CYMBALTA) 60 MG capsule Take 60 mg by mouth daily.    ? HYDROcodone-acetaminophen (NORCO) 10-325 MG tablet Take 1 tablet by mouth every 4 (four) hours as needed. 50 tablet 0  ? lactulose (CHRONULAC) 10 GM/15ML solution Take 30 mLs (20 g total) by mouth 4 (four) times daily as needed. 480 mL 3  ? metoprolol tartrate (LOPRESSOR) 25 MG tablet Take 12.5 mg by mouth  2 (two) times daily.    ? nitroGLYCERIN (NITROSTAT) 0.4 MG SL tablet Place 1 tablet (0.4 mg total) under the tongue every 5 (five) minutes as needed for chest pain (CP or SOB). 25 tablet 3  ? omeprazole (PRILOSEC) 40 MG capsule TAKE 1 CAPSULE(40 MG) BY MOUTH DAILY 90 capsule 0  ? potassium chloride SA (KLOR-CON) 20 MEQ tablet Take 20 mEq by mouth daily.    ? prochlorperazine (COMPAZINE) 10 MG tablet TAKE 1 TABLET(10 MG) BY MOUTH EVERY 6 HOURS AS NEEDED FOR NAUSEA 60 tablet 2  ? valproic acid (DEPAKENE) 250 MG/5ML solution TAKE 10 ML BY MOUTH THREE TIMES DAILY 900 mL 5  ? ?No current facility-administered  medications for this encounter.  ? ? ?Physical Findings: ?The patient is in no acute distress. Patient is alert and oriented. ? height is 6' (1.829 m) and weight is 170 lb 6.4 oz (77.3 kg). His temperature is 97.8 ?F (36.6 ?C). His blood pressure is 125/86 and his pulse is 51 (abnormal). His respiration is 20 and oxygen saturation is 95%. Marland Kitchen    ?HEENT: No lesions in mouth or upper throat ?Neck:   No palpable lymphadenopathy in the bilateral neck ?Heart is regular in rate and rhythm ?Chest notable for noisy sounds upon inspiration in the left lung throughout.  Otherwise fairly unremarkable ? ?Lab Findings: ?Lab Results  ?Component Value Date  ? WBC 7.2 01/18/2021  ? HGB 12.9 (L) 01/18/2021  ? HCT 39.0 01/18/2021  ? MCV 94.0 01/18/2021  ? PLT 185 01/18/2021  ? ?Lab Results  ?Component Value Date  ? TSH 5.568 (H) 01/18/2021  ? ? ?Radiographic Findings: ?CT Chest Wo Contrast ? ?Result Date: 05/04/2021 ?CLINICAL DATA:  61 year old male with non-small cell lung cancer. Restaging. Also with a history of head neck cancer. EXAM: CT CHEST WITHOUT CONTRAST TECHNIQUE: Multidetector CT imaging of the chest was performed following the standard protocol without IV contrast. RADIATION DOSE REDUCTION: This exam was performed according to the departmental dose-optimization program which includes automated exposure control, adjustment of the mA and/or kV according to patient size and/or use of iterative reconstruction technique. COMPARISON:  01/18/2021 FINDINGS: Cardiovascular: The heart size is normal. No substantial pericardial effusion. Coronary artery calcification is evident. Mild atherosclerotic calcification is noted in the wall of the thoracic aorta. Mediastinum/Nodes: No mediastinal lymphadenopathy. No evidence for gross hilar lymphadenopathy although assessment is limited by the lack of intravenous contrast on the current study. The esophagus has normal imaging features. There is no axillary lymphadenopathy. Lungs/Pleura:  Centrilobular and paraseptal emphysema evident. Subtle tree-in-bud nodular ground-glass opacity seen posterior right upper lobe on 82/4. Stable post radiation scarring medial right upper lobe (image 50/4). Volume loss in the right middle lobe is similar to prior. Atelectasis in both lower lobes is again noted. 8 mm curvilinear density posterior right lower lobe on 81/4 was in a region of more prominent atelectasis previously and may be scarring although thick walled air cyst could have this appearance. 2.5 cm left lower lobe nodule on 108/4 has an adjacent localization clip and is progressive in size in the interval (1.3 cm when remeasured in a similar fashion on the prior study). Adjacent irregular nodules in the posterior right apex identified previously as new have resolved in the interval. No focal airspace consolidation.  No pleural effusion. Upper Abdomen: Unremarkable. Musculoskeletal: No worrisome lytic or sclerotic osseous abnormality. IMPRESSION: 1. Interval progression of the 2.5 cm left lower lobe pulmonary nodule with adjacent localization clip. Imaging features are highly  concerning for disease progression. 2. 8 mm curvilinear density posterior right lower lobe was in a region of more prominent atelectasis previously and may have been obscured. This may be scarring although thick walled air cyst could have this appearance. Attention on follow-up recommended 3. Subtle tree-in-bud nodular ground-glass opacity posterior right upper lobe, likely infectious/inflammatory. Atypical etiology should be considered. 4. Stable post treatment scarring medial right upper lobe. 5. Adjacent tiny irregular nodules identified is new in the posterior right apex previously have resolved in the interval. 6. Aortic Atherosclerosis (ICD10-I70.0) and Emphysema (ICD10-J43.9). Electronically Signed   By: Misty Stanley M.D.   On: 05/04/2021 11:45   ? ?Impression/Plan: I personally shared the patient's images with him and his  wife and also shared his previous lung treatment plan so that we could look at where the dose was distributed in his left lower lung.  He has progressive growth in the area of his left lower lung mass.  This is concerni

## 2021-05-09 ENCOUNTER — Other Ambulatory Visit: Payer: Self-pay

## 2021-05-09 ENCOUNTER — Telehealth: Payer: Self-pay | Admitting: *Deleted

## 2021-05-09 DIAGNOSIS — R918 Other nonspecific abnormal finding of lung field: Secondary | ICD-10-CM

## 2021-05-09 DIAGNOSIS — C321 Malignant neoplasm of supraglottis: Secondary | ICD-10-CM

## 2021-05-09 NOTE — Telephone Encounter (Signed)
CALLED PATIENT TO INFORM OF PET SCAN FOR 05/11/21 - ARRIVAL TIME- 2:30 PM @ [facility], PATIENT TO HAVE WATER ONLY - 6 HRS, PRIOR TO TEST, SPOKE WITH PATIENT'S WIFE- [name] AND SHE IS AWARE OF THIS TEST ?

## 2021-05-11 ENCOUNTER — Ambulatory Visit (HOSPITAL_COMMUNITY)
Admission: RE | Admit: 2021-05-11 | Discharge: 2021-05-11 | Disposition: A | Discharge status: Home / Self Care | Payer: Medicare Other | Source: Ambulatory Visit | Attending: Radiation Oncology | Admitting: Radiation Oncology

## 2021-05-11 DIAGNOSIS — C3490 Malignant neoplasm of unspecified part of unspecified bronchus or lung: Secondary | ICD-10-CM | POA: Diagnosis not present

## 2021-05-11 DIAGNOSIS — I2510 Atherosclerotic heart disease of native coronary artery without angina pectoris: Secondary | ICD-10-CM | POA: Diagnosis not present

## 2021-05-11 DIAGNOSIS — I951 Orthostatic hypotension: Secondary | ICD-10-CM | POA: Diagnosis not present

## 2021-05-11 DIAGNOSIS — M542 Cervicalgia: Secondary | ICD-10-CM | POA: Diagnosis not present

## 2021-05-11 DIAGNOSIS — C3411 Malignant neoplasm of upper lobe, right bronchus or lung: Secondary | ICD-10-CM | POA: Diagnosis not present

## 2021-05-11 DIAGNOSIS — I10 Essential (primary) hypertension: Secondary | ICD-10-CM | POA: Diagnosis not present

## 2021-05-11 DIAGNOSIS — C321 Malignant neoplasm of supraglottis: Secondary | ICD-10-CM | POA: Diagnosis not present

## 2021-05-11 DIAGNOSIS — R918 Other nonspecific abnormal finding of lung field: Secondary | ICD-10-CM | POA: Diagnosis not present

## 2021-05-11 DIAGNOSIS — C7802 Secondary malignant neoplasm of left lung: Secondary | ICD-10-CM | POA: Diagnosis not present

## 2021-05-11 DIAGNOSIS — G319 Degenerative disease of nervous system, unspecified: Secondary | ICD-10-CM | POA: Diagnosis not present

## 2021-05-11 LAB — GLUCOSE, CAPILLARY: Glucose-Capillary: 87 mg/dL (ref 70–99)

## 2021-05-11 MED ORDER — FLUDEOXYGLUCOSE F - 18 (FDG) INJECTION
8.4700 | Freq: Once | INTRAVENOUS | Status: AC | PRN
  Administered 2021-05-11: 8.47 via INTRAVENOUS

## 2021-05-13 ENCOUNTER — Other Ambulatory Visit (HOSPITAL_COMMUNITY): Payer: Self-pay | Admitting: Hematology

## 2021-05-13 ENCOUNTER — Encounter: Payer: Self-pay | Admitting: Radiation Oncology

## 2021-05-13 ENCOUNTER — Other Ambulatory Visit: Payer: Self-pay

## 2021-05-13 DIAGNOSIS — R911 Solitary pulmonary nodule: Secondary | ICD-10-CM

## 2021-05-13 DIAGNOSIS — C321 Malignant neoplasm of supraglottis: Secondary | ICD-10-CM

## 2021-05-13 NOTE — Progress Notes (Signed)
I personally looked at the patient's PET scan and called the patient and his wife to discuss the results.  Given the hypermetabolic activity in the left lower lung mass I think he should undergo a CT-guided biopsy.  Patient and his wife are agreeable.  We will make a referral. ?----------------------------------- ? ?Eppie Gibson, MD ? ?

## 2021-05-16 NOTE — Progress Notes (Unsigned)
-----   Message -----  ?From: Markus Daft, MD  ?Sent: 05/13/2021   4:37 PM EDT  ?To: Roosvelt Maser  ?Subject: RE: CT Biopsy                                  ? ?CT guided core biopsy of left lower lobe mass.  ? ?[place]  ?

## 2021-05-17 ENCOUNTER — Institutional Professional Consult (permissible substitution): Payer: Medicare Other | Admitting: Neurology

## 2021-05-17 DIAGNOSIS — I10 Essential (primary) hypertension: Secondary | ICD-10-CM | POA: Diagnosis not present

## 2021-05-17 DIAGNOSIS — C321 Malignant neoplasm of supraglottis: Secondary | ICD-10-CM | POA: Diagnosis not present

## 2021-05-17 DIAGNOSIS — E559 Vitamin D deficiency, unspecified: Secondary | ICD-10-CM | POA: Diagnosis not present

## 2021-05-17 DIAGNOSIS — Z6822 Body mass index (BMI) 22.0-22.9, adult: Secondary | ICD-10-CM | POA: Diagnosis not present

## 2021-05-17 DIAGNOSIS — E440 Moderate protein-calorie malnutrition: Secondary | ICD-10-CM | POA: Diagnosis not present

## 2021-05-17 DIAGNOSIS — I2510 Atherosclerotic heart disease of native coronary artery without angina pectoris: Secondary | ICD-10-CM | POA: Diagnosis not present

## 2021-05-17 DIAGNOSIS — G894 Chronic pain syndrome: Secondary | ICD-10-CM | POA: Diagnosis not present

## 2021-05-17 DIAGNOSIS — Z0001 Encounter for general adult medical examination with abnormal findings: Secondary | ICD-10-CM | POA: Diagnosis not present

## 2021-05-17 DIAGNOSIS — M545 Low back pain, unspecified: Secondary | ICD-10-CM | POA: Diagnosis not present

## 2021-05-18 ENCOUNTER — Other Ambulatory Visit (HOSPITAL_COMMUNITY): Payer: Self-pay | Admitting: Hematology

## 2021-05-31 ENCOUNTER — Ambulatory Visit (HOSPITAL_COMMUNITY): Payer: Medicare Other

## 2021-06-08 ENCOUNTER — Other Ambulatory Visit: Payer: Self-pay | Admitting: Student

## 2021-06-08 DIAGNOSIS — R911 Solitary pulmonary nodule: Secondary | ICD-10-CM

## 2021-06-08 NOTE — H&P (Signed)
Chief Complaint: Patient was seen in consultation today for lung nodule biopsy   Referring Physician(s): Eppie Gibson  Supervising Physician: Arne Cleveland  Patient Status: [facility] - Out-pt  History of Present Illness: [name] is a 61 y.o. male with a medical history significant for HTN, CAD, anxiety/depression, supraglottic squamous cell carcinoma diagnosed 2021 and non-small cell lung cancer diagnosed 2022. He has been undergoing chemo and/or radiation therapy for these cancers. A CT chest 05/03/21 showed interval growth of a left lower lobe pulmonary nodule concerning for disease progression. A PET scan 05/11/21 revealed hypermetabolic activity within this nodule.   NM PET 05/11/21 IMPRESSION: 1. Focal hypermetabolism in the left piriform sinus with possible soft tissue asymmetry. If not recently performed, direct visualization suggested. 2. No evidence of cervical nodal hypermetabolic metastasis. 3. Hypermetabolism corresponding to the known enlarging left lower lobe pulmonary nodule, presumably the site of non-small-cell lung cancer. 4. Mild enlargement of a subcarinal node with low-level, nonspecific hypermetabolism. 5. No evidence of extrathoracic metastasis. 6. Mild hypermetabolism at the site of most likely post infectious/inflammatory interstitial thickening in the superior segment right lower lobe. Recommend attention on follow-up. 7. Aortic atherosclerosis (ICD10-I70.0), coronary artery atherosclerosis and emphysema (ICD10-J43.9). 8. Apparent bladder wall thickening may represent cystitis or a component of outlet obstruction. Consider correlation with urinalysis.  Interventional Radiology has been asked to evaluate this patient for an image-guided left lower lobe pulmonary nodule biopsy. Imaging reviewed and procedure approved by Dr. Anselm Pancoast  Past Medical History:  Diagnosis Date   Anemia    Anxiety    Arthritis    Cancer of larynx ([place])    Carotid artery  stenosis    Chronic lower back pain    Coronary artery disease    a. Diag cath 10/2012 for CP/abnormal nuc -> PCI s/p LAD atherectomy/DES placement 11/05/12.   Depression    Dilated aortic root (HCC)    GERD (gastroesophageal reflux disease)    History of hiatal hernia    Hypercholesteremia    Hypertension    Pneumonia    S/P percutaneous endoscopic gastrostomy (PEG) tube placement [facility])    Stroke ([place]) 07/31/2018   Pons    Past Surgical History:  Procedure Laterality Date   BIOPSY  11/24/2019   Procedure: BIOPSY;  Surgeon: Irving Copas., MD;  Location: [place];  Service: Gastroenterology;;   BIOPSY  03/29/2020   Procedure: BIOPSY;  Surgeon: Irving Copas., MD;  [facility];  Service: Gastroenterology;;   BRONCHIAL BIOPSY  07/26/2020   Procedure: BRONCHIAL BIOPSIES;  Surgeon: Garner Nash, DO;  Location: [place] [facility];  Service: Pulmonary;;   BRONCHIAL BRUSHINGS  07/26/2020   Procedure: BRONCHIAL BRUSHINGS;  Surgeon: Garner Nash, DO;  Location: [place] [facility];  Service: Pulmonary;;   BRONCHIAL NEEDLE ASPIRATION BIOPSY  07/26/2020   Procedure: BRONCHIAL NEEDLE ASPIRATION BIOPSIES;  Surgeon: Garner Nash, DO;  Location: [place];  Service: Pulmonary;;   CARDIAC CATHETERIZATION  10/29/2012   COLONOSCOPY WITH PROPOFOL N/A 11/24/2019   Procedure: COLONOSCOPY WITH PROPOFOL;  Surgeon: Irving Copas., MD;  Location: [place];  Service: Gastroenterology;  Laterality: N/A;   CORONARY ANGIOPLASTY WITH STENT PLACEMENT  11/05/2012   DIRECT LARYNGOSCOPY N/A 01/02/2019   Procedure: MICRO DIRECT LARYNGOSCOPY BIOPSY OF LARYNGEAL MASS;  Surgeon: [name], MD;  Location: [place];  Service: ENT;  Laterality: N/A;   ESOPHAGOGASTRODUODENOSCOPY (EGD) WITH PROPOFOL N/A 08/11/2019   Procedure: ATTEMPTED ESOPHAGOGASTRODUODENOSCOPY (EGD) WITH PROPOFOL (UNABLE TO PERFORM PERCUTANEOUS ENDOSCOPIC GASTROSTOMY TUBE REPLACEMENT);  Surgeon: [name], MD;   Location: [facility];  Service: Gastroenterology;  Laterality: N/A;   ESOPHAGOGASTRODUODENOSCOPY (EGD) WITH PROPOFOL N/A 11/24/2019   Procedure: ESOPHAGOGASTRODUODENOSCOPY (EGD) WITH PROPOFOL;  Surgeon: Rush Landmark Telford Nab., MD;  Location: [place];  Service: Gastroenterology;  Laterality: N/A;   ESOPHAGOGASTRODUODENOSCOPY (EGD) WITH PROPOFOL N/A 01/26/2020   Procedure: ESOPHAGOGASTRODUODENOSCOPY (EGD) WITH PROPOFOL;  Surgeon: Rush Landmark Telford Nab., MD;  Location: [place];  Service: Gastroenterology;  Laterality: N/A;   ESOPHAGOGASTRODUODENOSCOPY (EGD) WITH PROPOFOL N/A 03/29/2020   Procedure: ESOPHAGOGASTRODUODENOSCOPY (EGD) WITH PROPOFOL;  Surgeon: Rush Landmark Telford Nab., MD;  [facility];  Service: Gastroenterology;  Laterality: N/A;  NEEDS FLOURO   FIDUCIAL MARKER PLACEMENT  07/26/2020   Procedure: FIDUCIAL MARKER PLACEMENT;  Surgeon: Garner Nash, DO;  Location: [place] [facility];  Service: Pulmonary;;   HEMORRHOID SURGERY  ~ 2010   INSERTION OF MESH N/A 06/02/2015   Procedure: INSERTION OF MESH;  Surgeon: [name], MD;  [facility];  Service: General;  Laterality: N/A;   IR ANGIO INTRA EXTRACRAN SEL COM CAROTID INNOMINATE BILAT MOD SED  08/02/2018   IR ANGIO VERTEBRAL SEL VERTEBRAL BILAT MOD SED  08/02/2018   IR REPLACE G-TUBE SIMPLE WO FLUORO  08/12/2019   IR [place] GASTRO/COLONIC TUBE PERCUT W/FLUORO  08/13/2019   LAPAROSCOPIC INSERTION GASTROSTOMY TUBE N/A 02/24/2019   Procedure: LAPAROSCOPIC INSERTION GASTROSTOMY TUBE;  Surgeon: [name], MD;  Location: [place];  Service: General;  Laterality: N/A;   LEFT HEART CATHETERIZATION WITH CORONARY ANGIOGRAM N/A 10/30/2012   Procedure: LEFT HEART CATHETERIZATION WITH CORONARY ANGIOGRAM;  Surgeon: [name], MD;  Location: [place] [facility];  Service: Cardiovascular;  Laterality: N/A;   MULTIPLE EXTRACTIONS WITH ALVEOLOPLASTY N/A 02/18/2019   Procedure: Extraction of tooth #'s 5-7, 10,11,14,20-29, 31 and 32 with  alveoloplasty and maxiillary left buccal exostoses reductions;  Surgeon: [name], [name];  Location: [place];  Service: Oral Surgery;  Laterality: N/A;   PEG PLACEMENT N/A 01/26/2020   Procedure: PERCUTANEOUS ENDOSCOPIC GASTROSTOMY (PEG) REPLACEMENT;  Surgeon: Irving Copas., MD;  Location: [place];  Service: Gastroenterology;  Laterality: N/A;   PERCUTANEOUS CORONARY ROTOBLATOR INTERVENTION (PCI-R) N/A 11/05/2012   Procedure: PERCUTANEOUS CORONARY ROTOBLATOR INTERVENTION (PCI-R);  Surgeon: [name], MD;  Location: [facility];  Service: Cardiovascular;  Laterality: N/A;   SAVORY DILATION N/A 11/24/2019   Procedure: SAVORY DILATION;  Surgeon: Rush Landmark Telford Nab., MD;  Location: [place];  Service: Gastroenterology;  Laterality: N/A;   SAVORY DILATION N/A 01/26/2020   Procedure: SAVORY DILATION;  Surgeon: Rush Landmark Telford Nab., MD;  Location: [place];  Service: Gastroenterology;  Laterality: N/A;   TRACHEOSTOMY TUBE PLACEMENT N/A 02/18/2019   Procedure: Tracheostomy;  Surgeon: [name], MD;  Location: [place];  Service: ENT;  Laterality: N/A;   UMBILICAL HERNIA REPAIR N/A 06/02/2015   Procedure: UMBILICAL HERNIORRHAPHY WITH MESH;  Surgeon: [name], MD;  [facility];  Service: General;  Laterality: N/A;   VIDEO BRONCHOSCOPY WITH ENDOBRONCHIAL NAVIGATION Bilateral 07/26/2020   Procedure: VIDEO BRONCHOSCOPY WITH ENDOBRONCHIAL NAVIGATION;  Surgeon: Garner Nash, DO;  Location: [place];  Service: Pulmonary;  Laterality: Bilateral;  ION   VIDEO BRONCHOSCOPY WITH RADIAL ENDOBRONCHIAL ULTRASOUND  07/26/2020   Procedure: VIDEO BRONCHOSCOPY WITH RADIAL ENDOBRONCHIAL ULTRASOUND;  Surgeon: Garner Nash, DO;  Location: [place] [facility];  Service: Pulmonary;;    Allergies: Cymbalta [duloxetine hcl], Duloxetine, and Prednisone  Medications: Prior to Admission medications   Medication Sig Start Date End Date Taking? Authorizing Provider  acetaminophen  (  TYLENOL) 325 MG tablet Take 1.5 tablets (487.5 mg total) by mouth every 6 (six) hours as needed for mild pain, fever or headache (or Fever >/= 101). 05/02/20   Murlean Iba, MD  aspirin EC 81 MG tablet Take 1 tablet (81 mg total) by mouth daily with breakfast. 04/15/20   Roxan Hockey, MD  atorvastatin (LIPITOR) 40 MG tablet Take 80 mg by mouth at bedtime. 01/17/20   [provider]  Baclofen 5 MG TABS Take 10 mg by mouth at bedtime. 04/11/21   Frann Rider, NP  diazepam (VALIUM) 10 MG tablet Take 10 mg by mouth 3 (three) times daily as needed. 05/25/20   [provider]  dronabinol (MARINOL) 5 MG capsule TAKE 1 CAPSULE(5 MG) BY MOUTH TWICE DAILY BEFORE A MEAL 05/18/21   [name], MD  DULoxetine (CYMBALTA) 60 MG capsule Take 60 mg by mouth daily. 05/30/20   [provider]  HYDROcodone-acetaminophen (NORCO) 10-325 MG tablet Take 1 tablet by mouth every 4 (four) hours as needed. 06/02/15   [name], MD  lactulose (CHRONULAC) 10 GM/15ML solution TAKE 30 MLS( 20 GIVE) BY MOUTH FOUR TIMES DAILY AS NEEDED 05/13/21   Tarri Abernethy M, PA-C  metoprolol tartrate (LOPRESSOR) 25 MG tablet Take 12.5 mg by mouth 2 (two) times daily.    [provider]  nitroGLYCERIN (NITROSTAT) 0.4 MG SL tablet Place 1 tablet (0.4 mg total) under the tongue every 5 (five) minutes as needed for chest pain (CP or SOB). 07/27/14   Herminio Commons, MD  omeprazole (PRILOSEC) 40 MG capsule TAKE 1 CAPSULE(40 MG) BY MOUTH DAILY 03/21/21   Willia Craze, NP  potassium chloride SA (KLOR-CON) 20 MEQ tablet Take 20 mEq by mouth daily. 04/22/20   [provider]  prochlorperazine (COMPAZINE) 10 MG tablet TAKE 1 TABLET(10 MG) BY MOUTH EVERY 6 HOURS AS NEEDED FOR NAUSEA 08/19/19   Lockamy, Randi L, NP-C  valproic acid (DEPAKENE) 250 MG/5ML solution TAKE 10 ML BY MOUTH THREE TIMES DAILY 04/18/21   Frann Rider, NP     Family History  Problem Relation Age of Onset    Hypertension Mother    Heart disease Father    Hypertension Father    Heart disease Sister    Hypertension Maternal Grandmother    Hypertension Maternal Grandfather    Hypertension Paternal Grandmother    Hypertension Paternal Grandfather    Pancreatic cancer Maternal Uncle    Breast cancer Maternal Aunt    Breast cancer Maternal Aunt    Diabetes Paternal Uncle    Diabetes Paternal Aunt     Social History   Socioeconomic History   Marital status: Married    Spouse name: [name]   Number of children: 0   Years of education: Not on file   Highest education level: Not on file  Occupational History   Occupation: Disabled    Comment: previously worked in Nurse, learning disability as an Clinical biochemist  Tobacco Use   Smoking status: Former    Packs/day: 3.00    Years: 32.00    Total pack years: 96.00    Types: Cigarettes    Start date: 08/29/1977    Quit date: 11/29/2006    Years since quitting: 14.5   Smokeless tobacco: Former    Types: Chew    Quit date: 2003   Tobacco comments:    11/05/2012 "chewed tobacco when I play ball; haven't chewed since age 58"  Vaping Use   Vaping Use: Never used  Substance  and Sexual Activity   Alcohol use: Not Currently    Alcohol/week: 12.0 standard drinks of alcohol    Types: 12 Cans of beer per week    Comment: per wife [name] 6-7/week now as of 02/24/19   Drug use: Not Currently    Types: Marijuana    Comment: Last use was on - denies on 04/08/19   Sexual activity: Not on file  Other Topics Concern   Not on file  Social History Narrative   Lives with wife at home   Right Handed   Drinks 5-6 cups caffeine daily   Social Determinants of Health   Financial Resource Strain: Not on file  Food Insecurity: Not on file  Transportation Needs: Not on file  Physical Activity: Insufficiently Active (01/21/2019)   Exercise Vital Sign    Days of Exercise per Week: 2 days    Minutes of Exercise per Session: 30 min  Stress: Stress Concern Present  (01/21/2019)   [place]    Feeling of Stress : To some extent  Social Connections: Socially Isolated (01/21/2019)   Social Connection and Isolation Panel [NHANES]    Frequency of Communication with Friends and Family: Once a week    Frequency of Social Gatherings with Friends and Family: Once a week    Attends Religious Services: Never    Marine scientist or Organizations: No    Attends Music therapist: Never    Marital Status: Married    Review of Systems: A 12 point ROS discussed and pertinent positives are indicated in the HPI above.  All other systems are negative.  Review of Systems  Constitutional:  Negative for appetite change and fatigue.  Respiratory:  Negative for cough and shortness of breath.   Cardiovascular:  Negative for chest pain and leg swelling.  Gastrointestinal:  Negative for abdominal pain, diarrhea, nausea and vomiting.  Musculoskeletal:  Positive for back pain.  Neurological:  Positive for headaches. Negative for dizziness.    Vital Signs: There were no vitals taken for this visit.  Physical Exam Constitutional:      General: He is not in acute distress.    Appearance: He is not ill-appearing.  HENT:     Mouth/Throat:     Mouth: Mucous membranes are moist.     Pharynx: Oropharynx is clear.  Cardiovascular:     Rate and Rhythm: Normal rate and regular rhythm.     Pulses: Normal pulses.     Heart sounds: Normal heart sounds.  Pulmonary:     Effort: Pulmonary effort is normal.     Breath sounds: Normal breath sounds.  Abdominal:     General: Bowel sounds are normal.     Palpations: Abdomen is soft.  Skin:    General: Skin is warm and dry.  Neurological:     Mental Status: He is alert and oriented to person, place, and time.     Imaging: NM PET Image Restag (PS) Skull Base To Thigh  Result Date: 05/13/2021 CLINICAL DATA:  Subsequent treatment strategy for  malignant neoplasm of supraglottis. Non-small-cell lung cancer, status post bronchial biopsy 07/26/2020. EXAM: NUCLEAR MEDICINE PET SKULL BASE TO THIGH TECHNIQUE: 8.5 mCi F-18 FDG was injected intravenously. Full-ring PET imaging was performed from the skull base to thigh after the radiotracer. CT data was obtained and used for attenuation correction and anatomic localization. Fasting blood glucose: 87 mg/dl COMPARISON:  05/04/2021 chest CT. 01/18/2021 neck CT. Most recent  PET 07/02/2020. FINDINGS: Mediastinal blood pool activity: SUV max 2.0 Liver activity: SUV max NA NECK: Muscular activity within the neck. Given this limitation, no evidence of cervical nodal hypermetabolism. Involving the left piriform sinus is focal hypermetabolism with possible soft tissue asymmetry. Example at a S.U.V. max of 9.3 on 38/4. Incidental CT findings: Dense bilateral carotid atherosclerosis. Age advanced cerebral and cerebellar atrophy. CHEST: Subcarinal node measures 1.0 cm and a S.U.V. max of 4.1 on 75/4. Compare 9 mm and a S.U.V. max of 2.9 on the prior PET (when remeasured). The left lower lobe dominant nodule measures 2.3 cm and a S.U.V. max of 9.1 on 86/4. Compare 9 mm and a S.U.V. max of 3.7 on the prior PET. Superior segment right lower lobe focus of hypermetabolism may correspond to minimal interstitial thickening. Example at a S.U.V. max of 2.8 on 68/4. Similar in CT morphology to 07/02/2020 PET without hypermetabolism on that exam. Incidental CT findings: Deferred to recent diagnostic CT. Aortic and coronary artery calcification. Moderate centrilobular emphysema. Radiation clip or biopsy fiducial in the medial right upper lobe, at the site of nodule on 07/02/2020 PET. ABDOMEN/PELVIS: No abdominopelvic parenchymal or nodal hypermetabolism. Incidental CT findings: Normal adrenal glands. Normal noncontrast appearance of the liver, gallbladder, spleen, stomach, pancreas, kidneys. Mild bladder wall thickening. Abdominal aortic  atherosclerosis with nonaneurysmal infrarenal dilatation at 2.3 cm. SKELETON: No abnormal marrow activity. Incidental CT findings: none IMPRESSION: 1. Focal hypermetabolism in the left piriform sinus with possible soft tissue asymmetry. If not recently performed, direct visualization suggested. 2. No evidence of cervical nodal hypermetabolic metastasis. 3. Hypermetabolism corresponding to the known enlarging left lower lobe pulmonary nodule, presumably the site of non-small-cell lung cancer. 4. Mild enlargement of a subcarinal node with low-level, nonspecific hypermetabolism. 5. No evidence of extrathoracic metastasis. 6. Mild hypermetabolism at the site of most likely post infectious/inflammatory interstitial thickening in the superior segment right lower lobe. Recommend attention on follow-up. 7. Aortic atherosclerosis (ICD10-I70.0), coronary artery atherosclerosis and emphysema (ICD10-J43.9). 8. Apparent bladder wall thickening may represent cystitis or a component of outlet obstruction. Consider correlation with urinalysis. Electronically Signed   By: Abigail Miyamoto M.D.   On: 05/13/2021 14:06    Labs:  CBC: Recent Labs    07/26/20 0557 07/26/20 0820 08/17/20 1237 01/18/21 1430  WBC 4.8  --  4.0 7.2  HGB 10.8* 12.2* 11.6* 12.9*  HCT 36.6* 36.0* 36.0* 39.0  PLT 162  --  176 185    COAGS: No results for input(s): "INR", "APTT" in the last 8760 hours.  BMP: Recent Labs    07/26/20 0820 08/17/20 1237 01/18/21 1355 01/18/21 1430  NA 132* 129*  --  127*  K 4.3 4.1  --  4.2  CL 91* 91*  --  91*  CO2  --  32  --  30  GLUCOSE 80 78  --  91  BUN 15 25*  --  12  CALCIUM  --  8.8*  --  8.7*  CREATININE 0.70 0.71 1.10 0.84  GFRNONAA  --  >60  --  >60    LIVER FUNCTION TESTS: Recent Labs    08/17/20 1237 01/18/21 1430  BILITOT 0.5 0.4  AST 14* 31  ALT 10 22  ALKPHOS 42 82  PROT 7.0 7.0  ALBUMIN 3.4* 3.5    TUMOR MARKERS: No results for input(s): "AFPTM", "CEA", "CA199",  "CHROMGRNA" in the last 8760 hours.  Assessment and Plan:  Lung cancer with suspected disease progression; Left lower lobe pulmonary nodule:  Prentice Docker. Ishmael Holter, 56-year male, presents today to the [facility] Interventional Radiology department for an image-guided lung nodule biopsy.   Risks and benefits of CT guided lung nodule biopsy was discussed with the patient including, but not limited to bleeding, hemoptysis, respiratory failure requiring intubation, infection, pneumothorax requiring chest tube placement, stroke from air embolism or even death.  All of the patient's questions were answered and the patient is agreeable to proceed. He has been NPO. Last dose of Aspirin was 6 days ago. Labs and vitals are pending but will be reviewed prior to the start of the procedure.   Consent signed and in chart.  Thank you for this interesting consult.  I greatly enjoyed meeting ABDULMALIK DARCO and look forward to participating in their care.  A copy of this report was sent to the requesting provider on this date.  Electronically Signed: Soyla Dryer, AGACNP-BC [phone] 06/09/2021, 6:39 AM   I spent a total of  30 Minutes   in face to face in clinical consultation, greater than 50% of which was counseling/coordinating care for left lower lobe pulmonary nodule biopsy

## 2021-06-09 ENCOUNTER — Ambulatory Visit (HOSPITAL_COMMUNITY)
Admission: RE | Admit: 2021-06-09 | Discharge: 2021-06-09 | Disposition: A | Discharge status: Home / Self Care | Payer: Medicare Other | Source: Ambulatory Visit | Attending: Radiation Oncology | Admitting: Radiation Oncology

## 2021-06-09 ENCOUNTER — Other Ambulatory Visit: Payer: Self-pay

## 2021-06-09 ENCOUNTER — Ambulatory Visit (HOSPITAL_COMMUNITY)
Admission: RE | Admit: 2021-06-09 | Discharge: 2021-06-09 | Disposition: A | Discharge status: Home / Self Care | Payer: Medicare Other | Source: Ambulatory Visit | Attending: Interventional Radiology | Admitting: Interventional Radiology

## 2021-06-09 ENCOUNTER — Ambulatory Visit (HOSPITAL_COMMUNITY): Admission: RE | Admit: 2021-06-09 | Payer: Medicare Other | Source: Ambulatory Visit

## 2021-06-09 ENCOUNTER — Encounter (HOSPITAL_COMMUNITY): Payer: Self-pay

## 2021-06-09 DIAGNOSIS — I700 Atherosclerosis of aorta: Secondary | ICD-10-CM | POA: Insufficient documentation

## 2021-06-09 DIAGNOSIS — I6523 Occlusion and stenosis of bilateral carotid arteries: Secondary | ICD-10-CM | POA: Insufficient documentation

## 2021-06-09 DIAGNOSIS — J439 Emphysema, unspecified: Secondary | ICD-10-CM | POA: Insufficient documentation

## 2021-06-09 DIAGNOSIS — C3432 Malignant neoplasm of lower lobe, left bronchus or lung: Secondary | ICD-10-CM | POA: Diagnosis not present

## 2021-06-09 DIAGNOSIS — C321 Malignant neoplasm of supraglottis: Secondary | ICD-10-CM | POA: Insufficient documentation

## 2021-06-09 DIAGNOSIS — I2510 Atherosclerotic heart disease of native coronary artery without angina pectoris: Secondary | ICD-10-CM | POA: Insufficient documentation

## 2021-06-09 DIAGNOSIS — R918 Other nonspecific abnormal finding of lung field: Secondary | ICD-10-CM | POA: Diagnosis not present

## 2021-06-09 DIAGNOSIS — I10 Essential (primary) hypertension: Secondary | ICD-10-CM | POA: Diagnosis not present

## 2021-06-09 DIAGNOSIS — R911 Solitary pulmonary nodule: Secondary | ICD-10-CM

## 2021-06-09 LAB — CBC
HCT: 39.8 % (ref 39.0–52.0)
Hemoglobin: 13.5 g/dL (ref 13.0–17.0)
MCH: 31 pg (ref 26.0–34.0)
MCHC: 33.9 g/dL (ref 30.0–36.0)
MCV: 91.5 fL (ref 80.0–100.0)
Platelets: 188 10*3/uL (ref 150–400)
RBC: 4.35 MIL/uL (ref 4.22–5.81)
RDW: 14 % (ref 11.5–15.5)
WBC: 6 10*3/uL (ref 4.0–10.5)
nRBC: 0 % (ref 0.0–0.2)

## 2021-06-09 LAB — PROTIME-INR
INR: 1 (ref 0.8–1.2)
Prothrombin Time: 13.2 seconds (ref 11.4–15.2)

## 2021-06-09 MED ORDER — FENTANYL CITRATE (PF) 100 MCG/2ML IJ SOLN
INTRAMUSCULAR | Status: AC | PRN
  Administered 2021-06-09: 25 ug via INTRAVENOUS
  Administered 2021-06-09: 50 ug via INTRAVENOUS

## 2021-06-09 MED ORDER — MIDAZOLAM HCL 2 MG/2ML IJ SOLN
INTRAMUSCULAR | Status: AC | PRN
  Administered 2021-06-09: 1 mg via INTRAVENOUS
  Administered 2021-06-09: .5 mg via INTRAVENOUS

## 2021-06-09 MED ORDER — SODIUM CHLORIDE 0.9 % IV SOLN: INTRAVENOUS | Status: DC

## 2021-06-09 MED ORDER — FENTANYL CITRATE (PF) 100 MCG/2ML IJ SOLN
INTRAMUSCULAR | Status: AC
  Filled 2021-06-09: qty 2

## 2021-06-09 MED ORDER — MIDAZOLAM HCL 2 MG/2ML IJ SOLN
INTRAMUSCULAR | Status: AC
  Filled 2021-06-09: qty 2

## 2021-06-09 MED ORDER — HYDROCODONE-ACETAMINOPHEN 5-325 MG PO TABS: 1.0000 | ORAL_TABLET | ORAL | Status: DC | PRN

## 2021-06-09 MED ORDER — LIDOCAINE HCL 1 % IJ SOLN
INTRAMUSCULAR | Status: AC
  Filled 2021-06-09: qty 10

## 2021-06-09 NOTE — Procedures (Signed)
  Procedure:  CT core LLL lung nodule biopsy   Preprocedure diagnosis: Diagnoses of Left lower lobe pulmonary nodule, Malignant neoplasm of supraglottis ([place]), and Right upper lobe pulmonary nodule were pertinent to this visit.  Postprocedure diagnosis: same EBL:    minimal Complications:   none immediate  See full dictation in [facility].  Dillard Cannon MD Main # [phone] Pager  [phone] Mobile [phone]

## 2021-06-09 NOTE — Progress Notes (Signed)
Patient and wife was given discharge instructions. Both verbalized understanding. 

## 2021-06-10 ENCOUNTER — Telehealth: Payer: Self-pay

## 2021-06-10 NOTE — Telephone Encounter (Signed)
Called and spoke with patient's wife to let her know that Dr. Isidore Moos saw that patient's lung biopsy was completed successfully. Informed wife that Dr. Isidore Moos imagined the pathology would result next week while she was on vacation, but that patient would be discussed at the lung tumor board conference this coming Thursday. Assured wife that someone from patient's treatment team would call patient/wife the following Monday (06/20/21) to relay the TB specialists recommendations and plan.   Before hanging up, wife asked if patient was alright to resume his aspirin as well as start the new synthroid prescription written by patient's PCP (Dr. [name]). Informed wife that both medications were appropriate to resume. Wife verbalized understanding and appreciation of call, and denied any other needs at this time. She confirmed she has my direct contact information should she or patient have any other questions/concerns before they are updated on the plan 06/20/21

## 2021-06-11 ENCOUNTER — Other Ambulatory Visit: Payer: Self-pay | Admitting: Nurse Practitioner

## 2021-06-14 LAB — SURGICAL PATHOLOGY

## 2021-06-14 NOTE — Progress Notes (Signed)
Oncology Nurse Navigator Documentation   Per Dr. Pearlie Oyster request I have sent an email to pathology requesting PDL1 and p 16 testing be added to the biopsy completed on 06/09/21. I will follow for results.   Harlow Asa RN, BSN, OCN Head & Neck Oncology Nurse [place] at [facility] Phone # [phone]  Fax # [phone]

## 2021-06-15 DIAGNOSIS — I10 Essential (primary) hypertension: Secondary | ICD-10-CM | POA: Diagnosis not present

## 2021-06-15 DIAGNOSIS — G894 Chronic pain syndrome: Secondary | ICD-10-CM | POA: Diagnosis not present

## 2021-06-15 DIAGNOSIS — M545 Low back pain, unspecified: Secondary | ICD-10-CM | POA: Diagnosis not present

## 2021-06-15 DIAGNOSIS — C321 Malignant neoplasm of supraglottis: Secondary | ICD-10-CM | POA: Diagnosis not present

## 2021-06-16 ENCOUNTER — Encounter: Payer: Self-pay | Admitting: *Deleted

## 2021-06-16 ENCOUNTER — Other Ambulatory Visit: Payer: Self-pay | Admitting: *Deleted

## 2021-06-16 NOTE — Progress Notes (Signed)
The proposed treatment discussed in cancer conference is for discussion purpose only and is not a binding recommendation. The patient was not physically examined nor present for their treatment options. Therefore, final treatment plans cannot be decided.  ?

## 2021-06-16 NOTE — Progress Notes (Signed)
Oncology Nurse Navigator Documentation   I called [name] to confirm that PDL1 testing had been added to his biopsy on 06/09/21. I spoke with [place] and she confirmed that she will have it sent today.  Harlow Asa RN, BSN, OCN Head & Neck Oncology Nurse [place] at [facility] Phone # [phone]  Fax # [phone]

## 2021-06-16 NOTE — Progress Notes (Signed)
Per Dr. Isidore Moos, [name] case was discussed at TB today.  I updated his care team.

## 2021-06-20 ENCOUNTER — Encounter: Payer: Self-pay | Admitting: Radiation Oncology

## 2021-06-20 NOTE — Progress Notes (Signed)
I spoke w/ pt and wife by phone about his bx results.  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  Our nurse navigator, [facility], will also track whether Dr. Delton Coombes wants PDL-1 testing or anything else, and whether he has moved up appt w/ pt. Thoracic tumor board  recommended considering systemic therapy at this juncture.  Patient and wife agreeable to the above.  -----------------------------------  Eppie Gibson, MD

## 2021-06-21 ENCOUNTER — Ambulatory Visit (INDEPENDENT_AMBULATORY_CARE_PROVIDER_SITE_OTHER): Payer: Medicare Other | Admitting: Neurology

## 2021-06-21 ENCOUNTER — Encounter: Payer: Self-pay | Admitting: Neurology

## 2021-06-21 VITALS — BP 141/79 | HR 53 | Ht 72.0 in | Wt 165.0 lb

## 2021-06-21 DIAGNOSIS — C3432 Malignant neoplasm of lower lobe, left bronchus or lung: Secondary | ICD-10-CM | POA: Diagnosis not present

## 2021-06-21 DIAGNOSIS — G4734 Idiopathic sleep related nonobstructive alveolar hypoventilation: Secondary | ICD-10-CM | POA: Diagnosis not present

## 2021-06-21 NOTE — Patient Instructions (Signed)
Hypersomnia Hypersomnia is a condition in which a person feels very tired during the day even though the person gets plenty of sleep at night. A person with this condition may take naps during the day and may find it very difficult to wake up from sleep. Hypersomnia may affect a person's ability to think, concentrate, drive, or remember things. What are the causes? The cause of this condition may not be known. Possible causes include: Taking certain medicines. Using drugs or alcohol. Sleep disorders, such as narcolepsy and sleep apnea. Injury to the head, brain, or spinal cord. Tumors. Certain medical conditions. These include: Depression. Diabetes. Gastroesophageal reflux disease (GERD). An underactive thyroid gland (hypothyroidism). What are the signs or symptoms? The main symptoms of hypersomnia include: Feeling very tired throughout the day, regardless of how much sleep you got the night before. Having trouble waking up. Others may find it difficult to wake you up when you are sleeping. Sleeping for longer and longer periods at a time. Taking naps throughout the day. Other symptoms may include: Feeling restless, anxious, or annoyed. Lacking energy. Having trouble with: Remembering. Speaking. Thinking. Loss of appetite. Seeing, hearing, tasting, smelling, or feeling things that are not real (hallucinations). How is this diagnosed? This condition may be diagnosed based on: Your symptoms and medical history. Your sleeping habits. Your health care provider may ask you to write down your sleeping habits in a daily sleep log, along with any symptoms you have. A series of tests that are done while you sleep (sleep study or polysomnogram). A test that measures how quickly you can fall asleep during the day (daytime nap study or multiple sleep latency test). How is this treated? This condition may be treated by: Following a regular sleep routine. Making lifestyle changes, such as  changing your eating habits, getting regular exercise, and avoiding alcohol or caffeinated beverages. Taking medicines to make you more alert (stimulants) during the day. Treating any underlying medical causes of hypersomnia. Follow these instructions at home: Sleep habits Stick to a routine that includes going to bed and waking up at the same times every day and night. Practice a relaxing bedtime routine. This may include reading, meditation, deep breathing, or taking a warm bath before going to sleep. Exercise regularly as told by your health care provider. However, avoid exercising in the hours right before bedtime. Keep your sleep environment at a cooler temperature, darkened, and quiet. Sleep with pillows and a mattress that are comfortable and supportive. Schedule short 20-minute naps for when you feel sleepiest during the day. Talk with your employer or teachers about your hypersomnia. If possible, adjust your schedule so that: You have a regular daytime work schedule. You can take a scheduled nap during the day. You do not have to work or be active at night. Do not eat a heavy meal for a few hours before bedtime. Eat your meals at about the same times every day. Safety  Do not drive or use machinery if you are sleepy. Ask your health care provider if it is safe for you to drive. Wear a life jacket when swimming or spending time near water. General instructions  Take over-the-counter and prescription medicines only as told by your health care provider. This includes supplements. Avoid drinking alcohol or caffeinated beverages. Keep a sleep log that will help your health care provider manage your condition. This may include information about: What time you go to bed each night. How often you wake up at night. How many hours   you sleep at night. How often and for how long you nap during the day. Any observations from others, such as leg movements during sleep, sleep walking, or  snoring. Keep all follow-up visits. This is important. Contact a health care provider if: You have new symptoms. Your symptoms get worse. Get help right away if: You have thoughts about hurting yourself or someone else. Get help right away if you feel like you may hurt yourself or others, or have thoughts about taking your own life. Go to your nearest emergency room or: Call 911. Call the National Suicide Prevention Lifeline at 1-800-273-8255 or 988. This is open 24 hours a day. Text the Crisis Text Line at 741741. Summary Hypersomnia refers to a condition in which you feel very tired during the day even though you get plenty of sleep at night. A person with this condition may take naps during the day and may find it very difficult to wake up from sleep. Hypersomnia may affect a person's ability to think, concentrate, drive, or remember things. Treatment may include a regular sleep routine and making some lifestyle changes. This information is not intended to replace advice given to you by your health care provider. Make sure you discuss any questions you have with your health care provider. Document Revised: 11/29/2020 Document Reviewed: 11/29/2020 [employer] Patient Education  2023 [employer]  

## 2021-06-21 NOTE — Progress Notes (Signed)
Oncology Nurse Navigator Documentation   At Dr. Pearlie Oyster request I have spoken with Interventional Radiology at [facility]. 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 Dr. Isidore Moos specifically requested Dr. Lestine Box or Dr. Salley Scarlet. [facility] requested recent notes and image reports be sent to them which was done as requested with notification of successful fax submission. As requested I also had recent imaging pushed to [facility]. They have my direct contact information to call with any further needs or questions.  Harlow Asa RN, BSN, OCN Head & Neck Oncology Nurse [place] [facility] Phone # [phone]  Fax # [phone]
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
## 2021-06-23 NOTE — Progress Notes (Signed)
Oncology Nurse Navigator Documentation   At Dr. Pearlie Oyster request I contacted Dr. Deeann Saint office. I let them know that though Mr. Comp is being worked up for metastatic disease she wanted him to keep his appointment with Dr. Benjamine Mola in August and ask that a Laryngoscopy be completed to assess disease (if any) in his throat. I spoke with Dr. Deeann Saint nurse and she confirmed that they do complete Laryngoscopies at each visit with [name] and will do so in August as well.   Harlow Asa RN, BSN, OCN Head & Neck Oncology Nurse [place] at [facility] Phone # [phone]  Fax # [phone]

## 2021-06-27 ENCOUNTER — Emergency Department (HOSPITAL_COMMUNITY)
Admission: EM | Admit: 2021-06-27 | Discharge: 2021-06-27 | Disposition: A | Discharge status: Home / Self Care | Payer: Medicare Other | Attending: Emergency Medicine | Admitting: Emergency Medicine

## 2021-06-27 ENCOUNTER — Encounter (HOSPITAL_COMMUNITY): Payer: Self-pay

## 2021-06-27 ENCOUNTER — Other Ambulatory Visit: Payer: Self-pay

## 2021-06-27 ENCOUNTER — Emergency Department (HOSPITAL_COMMUNITY): Payer: Medicare Other

## 2021-06-27 ENCOUNTER — Inpatient Hospital Stay (HOSPITAL_COMMUNITY): Payer: Medicare Other | Admitting: Hematology

## 2021-06-27 DIAGNOSIS — Z85118 Personal history of other malignant neoplasm of bronchus and lung: Secondary | ICD-10-CM | POA: Diagnosis not present

## 2021-06-27 DIAGNOSIS — Z20822 Contact with and (suspected) exposure to covid-19: Secondary | ICD-10-CM | POA: Insufficient documentation

## 2021-06-27 DIAGNOSIS — I10 Essential (primary) hypertension: Secondary | ICD-10-CM | POA: Diagnosis not present

## 2021-06-27 DIAGNOSIS — Z7982 Long term (current) use of aspirin: Secondary | ICD-10-CM | POA: Insufficient documentation

## 2021-06-27 DIAGNOSIS — R531 Weakness: Secondary | ICD-10-CM | POA: Diagnosis not present

## 2021-06-27 DIAGNOSIS — Z79899 Other long term (current) drug therapy: Secondary | ICD-10-CM | POA: Insufficient documentation

## 2021-06-27 DIAGNOSIS — G319 Degenerative disease of nervous system, unspecified: Secondary | ICD-10-CM | POA: Diagnosis not present

## 2021-06-27 DIAGNOSIS — I2510 Atherosclerotic heart disease of native coronary artery without angina pectoris: Secondary | ICD-10-CM | POA: Insufficient documentation

## 2021-06-27 DIAGNOSIS — Z8521 Personal history of malignant neoplasm of larynx: Secondary | ICD-10-CM | POA: Insufficient documentation

## 2021-06-27 DIAGNOSIS — R29898 Other symptoms and signs involving the musculoskeletal system: Secondary | ICD-10-CM | POA: Diagnosis not present

## 2021-06-27 DIAGNOSIS — E871 Hypo-osmolality and hyponatremia: Secondary | ICD-10-CM

## 2021-06-27 LAB — RAPID URINE DRUG SCREEN, HOSP PERFORMED
Amphetamines: NOT DETECTED
Barbiturates: NOT DETECTED
Benzodiazepines: POSITIVE — AB
Cocaine: NOT DETECTED
Opiates: POSITIVE — AB
Tetrahydrocannabinol: POSITIVE — AB

## 2021-06-27 LAB — DIFFERENTIAL
Abs Immature Granulocytes: 0.04 10*3/uL (ref 0.00–0.07)
Basophils Absolute: 0 10*3/uL (ref 0.0–0.1)
Basophils Relative: 0 %
Eosinophils Absolute: 0.2 10*3/uL (ref 0.0–0.5)
Eosinophils Relative: 3 %
Immature Granulocytes: 1 %
Lymphocytes Relative: 15 %
Lymphs Abs: 1.1 10*3/uL (ref 0.7–4.0)
Monocytes Absolute: 0.9 10*3/uL (ref 0.1–1.0)
Monocytes Relative: 13 %
Neutro Abs: 4.8 10*3/uL (ref 1.7–7.7)
Neutrophils Relative %: 68 %

## 2021-06-27 LAB — URINALYSIS, ROUTINE W REFLEX MICROSCOPIC
Bilirubin Urine: NEGATIVE
Glucose, UA: NEGATIVE mg/dL
Hgb urine dipstick: NEGATIVE
Ketones, ur: NEGATIVE mg/dL
Leukocytes,Ua: NEGATIVE
Nitrite: NEGATIVE
Protein, ur: NEGATIVE mg/dL
Specific Gravity, Urine: 1.004 — ABNORMAL LOW (ref 1.005–1.030)
pH: 8 (ref 5.0–8.0)

## 2021-06-27 LAB — COMPREHENSIVE METABOLIC PANEL
ALT: 10 U/L (ref 0–44)
AST: 18 U/L (ref 15–41)
Albumin: 3.6 g/dL (ref 3.5–5.0)
Alkaline Phosphatase: 89 U/L (ref 38–126)
Anion gap: 9 (ref 5–15)
BUN: 10 mg/dL (ref 6–20)
CO2: 27 mmol/L (ref 22–32)
Calcium: 9.1 mg/dL (ref 8.9–10.3)
Chloride: 87 mmol/L — ABNORMAL LOW (ref 98–111)
Creatinine, Ser: 0.83 mg/dL (ref 0.61–1.24)
GFR, Estimated: >60 mL/min (ref >60)
Glucose, Bld: 90 mg/dL (ref 70–99)
Potassium: 3.7 mmol/L (ref 3.5–5.1)
Sodium: 123 mmol/L — ABNORMAL LOW (ref 135–145)
Total Bilirubin: 0.7 mg/dL (ref 0.3–1.2)
Total Protein: 8 g/dL (ref 6.5–8.1)

## 2021-06-27 LAB — CBC
HCT: 40.1 % (ref 39.0–52.0)
Hemoglobin: 13.8 g/dL (ref 13.0–17.0)
MCH: 30.7 pg (ref 26.0–34.0)
MCHC: 34.4 g/dL (ref 30.0–36.0)
MCV: 89.3 fL (ref 80.0–100.0)
Platelets: 174 10*3/uL (ref 150–400)
RBC: 4.49 MIL/uL (ref 4.22–5.81)
RDW: 13.5 % (ref 11.5–15.5)
WBC: 7.1 10*3/uL (ref 4.0–10.5)
nRBC: 0 % (ref 0.0–0.2)

## 2021-06-27 LAB — RESP PANEL BY RT-PCR (FLU A&B, COVID) ARPGX2
Influenza A by PCR: NEGATIVE
Influenza B by PCR: NEGATIVE
SARS Coronavirus 2 by RT PCR: NEGATIVE

## 2021-06-27 LAB — ETHANOL: Alcohol, Ethyl (B): <10 mg/dL (ref <10)

## 2021-06-27 MED ORDER — SODIUM CHLORIDE 0.9 % IV SOLN
100.0000 mL/h | INTRAVENOUS | Status: DC
Start: 2021-06-27 — End: 2021-06-27

## 2021-06-27 MED ORDER — SODIUM CHLORIDE 0.9 % IV BOLUS
500.0000 mL | Freq: Once | INTRAVENOUS | Status: AC
  Administered 2021-06-27: 500 mL via INTRAVENOUS

## 2021-06-27 MED ORDER — AMLODIPINE BESYLATE 5 MG PO TABS
5.0000 mg | ORAL_TABLET | Freq: Once | ORAL | Status: AC
  Administered 2021-06-27: 5 mg via ORAL
  Filled 2021-06-27: qty 1

## 2021-06-27 MED ORDER — AMLODIPINE BESYLATE 5 MG PO TABS: 5.0000 mg | ORAL_TABLET | Freq: Every day | ORAL | 0 refills | Status: DC

## 2021-06-29 ENCOUNTER — Inpatient Hospital Stay (HOSPITAL_COMMUNITY): Payer: Medicare Other | Attending: Hematology | Admitting: Hematology

## 2021-06-29 VITALS — BP 105/78 | HR 67 | Temp 98.4°F | Resp 18 | Ht 65.95 in | Wt 158.7 lb

## 2021-06-29 DIAGNOSIS — C321 Malignant neoplasm of supraglottis: Secondary | ICD-10-CM | POA: Diagnosis not present

## 2021-06-29 DIAGNOSIS — C3432 Malignant neoplasm of lower lobe, left bronchus or lung: Secondary | ICD-10-CM | POA: Insufficient documentation

## 2021-06-29 DIAGNOSIS — Z803 Family history of malignant neoplasm of breast: Secondary | ICD-10-CM | POA: Diagnosis not present

## 2021-06-29 DIAGNOSIS — Z87891 Personal history of nicotine dependence: Secondary | ICD-10-CM | POA: Diagnosis not present

## 2021-06-29 DIAGNOSIS — Z800 Family history of malignant neoplasm of digestive organs: Secondary | ICD-10-CM | POA: Diagnosis not present

## 2021-06-29 DIAGNOSIS — C760 Malignant neoplasm of head, face and neck: Secondary | ICD-10-CM | POA: Diagnosis not present

## 2021-06-29 DIAGNOSIS — Z8673 Personal history of transient ischemic attack (TIA), and cerebral infarction without residual deficits: Secondary | ICD-10-CM | POA: Diagnosis not present

## 2021-06-29 DIAGNOSIS — R197 Diarrhea, unspecified: Secondary | ICD-10-CM | POA: Diagnosis not present

## 2021-06-29 DIAGNOSIS — F419 Anxiety disorder, unspecified: Secondary | ICD-10-CM | POA: Diagnosis not present

## 2021-06-29 DIAGNOSIS — Z931 Gastrostomy status: Secondary | ICD-10-CM | POA: Diagnosis not present

## 2021-06-29 NOTE — Progress Notes (Signed)
[facility] [address], [place] [address]   CLINIC:  Medical Oncology/Hematology  PCP:  Elfredia Nevins, MD [address] / [place] [address] [phone]   REASON FOR VISIT:  Follow-up for supraglottic squamous cell carcinoma  PRIOR THERAPY: Chemoradiation with weekly cisplatin from 03/07/2019 to 05/15/2019  NGS Results: not done  CURRENT THERAPY: surveillance  BRIEF ONCOLOGIC HISTORY:  Oncology History  Malignant neoplasm of supraglottis (HCC)  01/22/2019 Initial Diagnosis   Malignant neoplasm of supraglottis (HCC)   01/30/2019 Cancer Staging   Staging form: Larynx - Supraglottis, AJCC 8th Edition - Clinical: Stage IVA (cT2, cN2c, cM0) - Signed by Doreatha Massed, MD on 01/30/2019   03/07/2019 - 05/15/2019 Chemotherapy   Patient is on Treatment Plan : HEAD/NECK Cisplatin q7d       CANCER STAGING: Cancer Staging  Malignant neoplasm of supraglottis (HCC) Staging form: Larynx - Supraglottis, AJCC 8th Edition - Clinical: Stage IVA (cT2, cN2c, cM0) - Signed by Doreatha Massed, MD on 01/30/2019   INTERVAL HISTORY:  [name], a 61 y.o. male, returns for routine follow-up of his supraglottic squamous cell carcinoma. [name] was last seen on 02/09/2021.   Today he reports feeling good. He reports increased anxiety.   REVIEW OF SYSTEMS:  Review of Systems  Constitutional:  Negative for appetite change and fatigue.  Cardiovascular:  Positive for chest pain.  Gastrointestinal:  Positive for constipation.  Musculoskeletal:  Positive for back pain (7/10).  Neurological:  Positive for dizziness and headaches.  Psychiatric/Behavioral:  Positive for depression. The patient is nervous/anxious.   All other systems reviewed and are negative.   PAST MEDICAL/SURGICAL HISTORY:  Past Medical History:  Diagnosis Date   Anemia    Anxiety    Arthritis    Cancer of larynx (HCC)    Carotid artery stenosis    Chronic lower back pain     Coronary artery disease    a. Diag cath 10/2012 for CP/abnormal nuc -> PCI s/p LAD atherectomy/DES placement 11/05/12.   Depression    Dilated aortic root (HCC)    GERD (gastroesophageal reflux disease)    History of hiatal hernia    Hypercholesteremia    Hypertension    Pneumonia    S/P percutaneous endoscopic gastrostomy (PEG) tube placement [facility])    Stroke (HCC) 07/31/2018   Pons   Past Surgical History:  Procedure Laterality Date   BIOPSY  11/24/2019   Procedure: BIOPSY;  Surgeon: Lemar Lofty., MD;  Location: [facility] ENDOSCOPY;  Service: Gastroenterology;;   BIOPSY  03/29/2020   Procedure: BIOPSY;  Surgeon: Lemar Lofty., MD;  Location: [facility] ENDOSCOPY;  Service: Gastroenterology;;   BRONCHIAL BIOPSY  07/26/2020   Procedure: BRONCHIAL BIOPSIES;  Surgeon: Josephine Igo, DO;  Location: [facility] ENDOSCOPY;  Service: Pulmonary;;   BRONCHIAL BRUSHINGS  07/26/2020   Procedure: BRONCHIAL BRUSHINGS;  Surgeon: Josephine Igo, DO;  Location: [facility] ENDOSCOPY;  Service: Pulmonary;;   BRONCHIAL NEEDLE ASPIRATION BIOPSY  07/26/2020   Procedure: BRONCHIAL NEEDLE ASPIRATION BIOPSIES;  Surgeon: Josephine Igo, DO;  Location: [facility] ENDOSCOPY;  Service: Pulmonary;;   CARDIAC CATHETERIZATION  10/29/2012   COLONOSCOPY WITH PROPOFOL N/A 11/24/2019   Procedure: COLONOSCOPY WITH PROPOFOL;  Surgeon: Lemar Lofty., MD;  Location: [facility] ENDOSCOPY;  Service: Gastroenterology;  Laterality: N/A;   CORONARY ANGIOPLASTY WITH STENT PLACEMENT  11/05/2012   DIRECT LARYNGOSCOPY N/A 01/02/2019   Procedure: MICRO DIRECT LARYNGOSCOPY BIOPSY OF LARYNGEAL MASS;  Surgeon: Newman Pies, MD;  Location: [facility]  OR;  Service: ENT;  Laterality: N/A;   ESOPHAGOGASTRODUODENOSCOPY (EGD) WITH PROPOFOL N/A 08/11/2019   Procedure: ATTEMPTED ESOPHAGOGASTRODUODENOSCOPY (EGD) WITH PROPOFOL (UNABLE TO PERFORM PERCUTANEOUS ENDOSCOPIC GASTROSTOMY TUBE REPLACEMENT);  Surgeon: [name], MD;  Location: [facility] ORS;  Service:  Gastroenterology;  Laterality: N/A;   ESOPHAGOGASTRODUODENOSCOPY (EGD) WITH PROPOFOL N/A 11/24/2019   Procedure: ESOPHAGOGASTRODUODENOSCOPY (EGD) WITH PROPOFOL;  Surgeon: Rush Landmark Telford Nab., MD;  Location: [place];  Service: Gastroenterology;  Laterality: N/A;   ESOPHAGOGASTRODUODENOSCOPY (EGD) WITH PROPOFOL N/A 01/26/2020   Procedure: ESOPHAGOGASTRODUODENOSCOPY (EGD) WITH PROPOFOL;  Surgeon: Rush Landmark Telford Nab., MD;  Location: [place];  Service: Gastroenterology;  Laterality: N/A;   ESOPHAGOGASTRODUODENOSCOPY (EGD) WITH PROPOFOL N/A 03/29/2020   Procedure: ESOPHAGOGASTRODUODENOSCOPY (EGD) WITH PROPOFOL;  Surgeon: Rush Landmark Telford Nab., MD;  Location: [facility] ENDOSCOPY;  Service: Gastroenterology;  Laterality: N/A;  NEEDS FLOURO   FIDUCIAL MARKER PLACEMENT  07/26/2020   Procedure: FIDUCIAL MARKER PLACEMENT;  Surgeon: Garner Nash, DO;  Location: [facility] ENDOSCOPY;  Service: Pulmonary;;   HEMORRHOID SURGERY  ~ 2010   INSERTION OF MESH N/A 06/02/2015   Procedure: INSERTION OF MESH;  Surgeon: [name], MD;  Location: [facility] ORS;  Service: General;  Laterality: N/A;   IR ANGIO INTRA EXTRACRAN SEL COM CAROTID INNOMINATE BILAT MOD SED  08/02/2018   IR ANGIO VERTEBRAL SEL VERTEBRAL BILAT MOD SED  08/02/2018   IR REPLACE G-TUBE SIMPLE WO FLUORO  08/12/2019   IR [place] GASTRO/COLONIC TUBE PERCUT W/FLUORO  08/13/2019   LAPAROSCOPIC INSERTION GASTROSTOMY TUBE N/A 02/24/2019   Procedure: LAPAROSCOPIC INSERTION GASTROSTOMY TUBE;  Surgeon: [name], MD;  Location: [place];  Service: General;  Laterality: N/A;   LEFT HEART CATHETERIZATION WITH CORONARY ANGIOGRAM N/A 10/30/2012   Procedure: LEFT HEART CATHETERIZATION WITH CORONARY ANGIOGRAM;  Surgeon: [name], MD;  Location: [place] CATH LAB;  Service: Cardiovascular;  Laterality: N/A;   MULTIPLE EXTRACTIONS WITH ALVEOLOPLASTY N/A 02/18/2019   Procedure: Extraction of tooth #'s 5-7, 10,11,14,20-29, 31 and 32 with alveoloplasty and maxiillary left  buccal exostoses reductions;  Surgeon: [name], [name];  Location: [place];  Service: Oral Surgery;  Laterality: N/A;   PEG PLACEMENT N/A 01/26/2020   Procedure: PERCUTANEOUS ENDOSCOPIC GASTROSTOMY (PEG) REPLACEMENT;  Surgeon: Irving Copas., MD;  Location: [place];  Service: Gastroenterology;  Laterality: N/A;   PERCUTANEOUS CORONARY ROTOBLATOR INTERVENTION (PCI-R) N/A 11/05/2012   Procedure: PERCUTANEOUS CORONARY ROTOBLATOR INTERVENTION (PCI-R);  Surgeon: [name], MD;  Location: [facility] CATH LAB;  Service: Cardiovascular;  Laterality: N/A;   SAVORY DILATION N/A 11/24/2019   Procedure: SAVORY DILATION;  Surgeon: Rush Landmark Telford Nab., MD;  Location: [place];  Service: Gastroenterology;  Laterality: N/A;   SAVORY DILATION N/A 01/26/2020   Procedure: SAVORY DILATION;  Surgeon: Rush Landmark Telford Nab., MD;  Location: [place];  Service: Gastroenterology;  Laterality: N/A;   TRACHEOSTOMY TUBE PLACEMENT N/A 02/18/2019   Procedure: Tracheostomy;  Surgeon: [name], MD;  Location: [place];  Service: ENT;  Laterality: N/A;   UMBILICAL HERNIA REPAIR N/A 06/02/2015   Procedure: UMBILICAL HERNIORRHAPHY WITH MESH;  Surgeon: [name], MD;  Location: [facility];  Service: General;  Laterality: N/A;   VIDEO BRONCHOSCOPY WITH ENDOBRONCHIAL NAVIGATION Bilateral 07/26/2020   Procedure: VIDEO BRONCHOSCOPY WITH ENDOBRONCHIAL NAVIGATION;  Surgeon: Garner Nash, DO;  Location: [place];  Service: Pulmonary;  Laterality: Bilateral;  ION   VIDEO BRONCHOSCOPY WITH RADIAL ENDOBRONCHIAL ULTRASOUND  07/26/2020   Procedure: VIDEO BRONCHOSCOPY WITH RADIAL ENDOBRONCHIAL ULTRASOUND;  Surgeon: Garner Nash, DO;  Location: [place];  Service: Pulmonary;;    SOCIAL HISTORY:  Social History   Socioeconomic History   Marital status: Married    Spouse name: [name]   Number of children: 0   Years of education: Not on file   Highest education level: Not on file  Occupational History    Occupation: Disabled    Comment: previously worked in Nurse, learning disability as an Clinical biochemist  Tobacco Use   Smoking status: Former    Packs/day: 3.00    Years: 32.00    Total pack years: 96.00    Types: Cigarettes    Start date: 08/29/1977    Quit date: 11/29/2006    Years since quitting: 14.5   Smokeless tobacco: Former    Types: Chew    Quit date: 2003   Tobacco comments:    11/05/2012 "chewed tobacco when I play ball; haven't chewed since age 52"  Vaping Use   Vaping Use: Never used  Substance and Sexual Activity   Alcohol use: Not Currently    Alcohol/week: 12.0 standard drinks of alcohol    Types: 12 Cans of beer per week    Comment: per wife [name] 6-7/week now as of 02/24/19   Drug use: Not Currently    Types: Marijuana    Comment: Last use was on - denies on 04/08/19   Sexual activity: Not on file  Other Topics Concern   Not on file  Social History Narrative   Lives with wife at home   Right Handed   Drinks 5-6 cups caffeine daily   Social Determinants of Health   Financial Resource Strain: Low Risk  (01/21/2019)   Overall Financial Resource Strain (CARDIA)    Difficulty of Paying Living Expenses: Not hard at all  Food Insecurity: No Food Insecurity (08/19/2019)   Hunger Vital Sign    Worried About Running Out of Food in the Last Year: Never true    [place] in the Last Year: Never true  Transportation Needs: No Transportation Needs (01/21/2019)   PRAPARE - Hydrologist (Medical): No    Lack of Transportation (Non-Medical): No  Physical Activity: Insufficiently Active (01/21/2019)   Exercise Vital Sign    Days of Exercise per Week: 2 days    Minutes of Exercise per Session: 30 min  Stress: Stress Concern Present (01/21/2019)   [place]    Feeling of Stress : To some extent  Social Connections: Socially Isolated (01/21/2019)   Social Connection and Isolation  Panel [NHANES]    Frequency of Communication with Friends and Family: Once a week    Frequency of Social Gatherings with Friends and Family: Once a week    Attends Religious Services: Never    Marine scientist or Organizations: No    Attends Archivist Meetings: Never    Marital Status: Married  Human resources officer Violence: Not At Risk (01/21/2019)   Humiliation, Afraid, Rape, and Kick questionnaire    Fear of Current or Ex-Partner: No    Emotionally Abused: No    Physically Abused: No    Sexually Abused: No    FAMILY HISTORY:  Family History  Problem Relation Age of Onset   Hypertension Mother    Heart disease Father    Hypertension Father    Heart disease Sister    Hypertension Maternal Grandmother    Hypertension Maternal Grandfather    Hypertension Paternal Grandmother    Hypertension Paternal Merchant navy officer  Pancreatic cancer Maternal Uncle    Breast cancer Maternal Aunt    Breast cancer Maternal Aunt    Diabetes Paternal Uncle    Diabetes Paternal Aunt     CURRENT MEDICATIONS:  Current Outpatient Medications  Medication Sig Dispense Refill   acetaminophen (TYLENOL) 325 MG tablet Take 1.5 tablets (487.5 mg total) by mouth every 6 (six) hours as needed for mild pain, fever or headache (or Fever >/= 101).     albuterol (VENTOLIN HFA) 108 (90 Base) MCG/ACT inhaler Inhale 1-2 puffs into the lungs every 4 (four) hours as needed for wheezing or shortness of breath.     amLODipine (NORVASC) 5 MG tablet Take 1 tablet (5 mg total) by mouth daily. 30 tablet 0   aspirin EC 81 MG tablet Take 1 tablet (81 mg total) by mouth daily with breakfast. 30 tablet 4   atorvastatin (LIPITOR) 40 MG tablet Take 80 mg by mouth at bedtime.     Baclofen 5 MG TABS Take 10 mg by mouth at bedtime. (Patient taking differently: Take 5 mg by mouth at bedtime.) 60 tablet 3   diazepam (VALIUM) 10 MG tablet Take 5-10 mg by mouth in the morning and at bedtime. $RemoveBef'5mg'BKjlIhZfof$  in the morning, and $RemoveBefo'10mg'MXzzvQOmLrf$  at  night, 1 extra tab if needed     docusate sodium (COLACE) 100 MG capsule Take 100 mg by mouth 2 (two) times daily.     dronabinol (MARINOL) 5 MG capsule TAKE 1 CAPSULE(5 MG) BY MOUTH TWICE DAILY BEFORE A MEAL (Patient taking differently: Take 5 mg by mouth 2 (two) times daily before lunch and supper.) 60 capsule 3   DULoxetine (CYMBALTA) 60 MG capsule Take 60 mg by mouth daily.     HYDROcodone-acetaminophen (NORCO) 10-325 MG tablet Take 1 tablet by mouth every 4 (four) hours as needed. (Patient taking differently: Take 1 tablet by mouth every 4 (four) hours as needed for moderate pain.) 50 tablet 0   lactulose (CHRONULAC) 10 GM/15ML solution TAKE 30 MLS( 20 GIVE) BY MOUTH FOUR TIMES DAILY AS NEEDED (Patient taking differently: Take 20 g by mouth 4 (four) times daily as needed for mild constipation.) 473 mL 2   levothyroxine (SYNTHROID) 25 MCG tablet Take 25 mcg by mouth daily.     metoprolol tartrate (LOPRESSOR) 25 MG tablet Take 12.5 mg by mouth 2 (two) times daily.     nitroGLYCERIN (NITROSTAT) 0.4 MG SL tablet Place 1 tablet (0.4 mg total) under the tongue every 5 (five) minutes as needed for chest pain (CP or SOB). 25 tablet 3   omeprazole (PRILOSEC) 40 MG capsule TAKE 1 CAPSULE(40 MG) BY MOUTH DAILY (Patient taking differently: Take 40 mg by mouth daily.) 90 capsule 0   potassium chloride SA (KLOR-CON) 20 MEQ tablet Take 20 mEq by mouth daily.     valproic acid (DEPAKENE) 250 MG/5ML solution TAKE 10 ML BY MOUTH THREE TIMES DAILY (Patient taking differently: Take 500 mg by mouth 3 (three) times daily.) 900 mL 5   No current facility-administered medications for this visit.    ALLERGIES:  Allergies  Allergen Reactions   Cymbalta [Duloxetine Hcl]    Duloxetine Other (See Comments)    Caused depression/ patient is taking   Prednisone Other (See Comments)    Chest pain    PHYSICAL EXAM:  Performance status (ECOG): 1 - Symptomatic but completely ambulatory  There were no vitals filed for  this visit. Wt Readings from Last 3 Encounters:  06/27/21 165 lb (74.8 kg)  06/21/21  165 lb (74.8 kg)  06/09/21 170 lb (77.1 kg)   Physical Exam Vitals reviewed.  Constitutional:      Appearance: Normal appearance.     Comments: In wheelchair  Cardiovascular:     Rate and Rhythm: Normal rate and regular rhythm.     Pulses: Normal pulses.     Heart sounds: Normal heart sounds.  Pulmonary:     Effort: Pulmonary effort is normal.     Breath sounds: Normal breath sounds.  Neurological:     General: No focal deficit present.     Mental Status: He is alert and oriented to person, place, and time.  Psychiatric:        Mood and Affect: Mood normal.        Behavior: Behavior normal.      LABORATORY DATA:  I have reviewed the labs as listed.     Latest Ref Rng & Units 06/27/2021   10:26 AM 06/09/2021    6:30 AM 01/18/2021    2:30 PM  CBC  WBC 4.0 - 10.5 K/uL 7.1  6.0  7.2   Hemoglobin 13.0 - 17.0 g/dL 95.8  09.0  18.5   Hematocrit 39.0 - 52.0 % 40.1  39.8  39.0   Platelets 150 - 400 K/uL 174  188  185       Latest Ref Rng & Units 06/27/2021   10:26 AM 01/18/2021    2:30 PM 01/18/2021    1:55 PM  CMP  Glucose 70 - 99 mg/dL 90  91    BUN 6 - 20 mg/dL 10  12    Creatinine 7.71 - 1.24 mg/dL 2.77  5.42  6.64   Sodium 135 - 145 mmol/L 123  127    Potassium 3.5 - 5.1 mmol/L 3.7  4.2    Chloride 98 - 111 mmol/L 87  91    CO2 22 - 32 mmol/L 27  30    Calcium 8.9 - 10.3 mg/dL 9.1  8.7    Total Protein 6.5 - 8.1 g/dL 8.0  7.0    Total Bilirubin 0.3 - 1.2 mg/dL 0.7  0.4    Alkaline Phos 38 - 126 U/L 89  82    AST 15 - 41 U/L 18  31    ALT 0 - 44 U/L 10  22      DIAGNOSTIC IMAGING:  I have independently reviewed the scans and discussed with the patient. MR BRAIN WO CONTRAST  Result Date: 06/27/2021 CLINICAL DATA:  Neuro deficit, acute, stroke suspected EXAM: MRI HEAD WITHOUT CONTRAST TECHNIQUE: Multiplanar, multiecho pulse sequences of the brain and surrounding structures were  obtained without intravenous contrast. COMPARISON:  CT head from the same day. MRI head May 12, 2020 (without report). FINDINGS: Brain: No acute infarction, hemorrhage, hydrocephalus, extra-axial collection or mass lesion. Cerebral atrophy. Patchy T2/FLAIR hyperintensities in the white matter, nonspecific but compatible with chronic microvascular ischemic disease. Vascular: Abnormal flow voids within the vertebral arteries and basilar artery. Hyper intense appearance of the left transverse/sigmoid sinus on T2/FLAIR, favored to represent slow flow over closure. Skull and upper cervical spine: Normal marrow signal. Sinuses/Orbits: Mild paranasal sinus mucosal thickening. No acute orbital findings. Other: No mastoid effusions. IMPRESSION: 1. No evidence of acute intracranial abnormality. 2. Abnormal flow voids within the vertebral arteries and basilar artery, compatible with findings seen on prior CTA from April 30, 2020. 3.  Cerebral atrophy (ICD10-G31.9). Electronically Signed   By: Feliberto Harts M.D.   On: 06/27/2021 14:12  CT HEAD WO CONTRAST  Result Date: 06/27/2021 CLINICAL DATA:  Left-sided weakness EXAM: CT HEAD WITHOUT CONTRAST TECHNIQUE: Contiguous axial images were obtained from the base of the skull through the vertex without intravenous contrast. RADIATION DOSE REDUCTION: This exam was performed according to the departmental dose-optimization program which includes automated exposure control, adjustment of the mA and/or kV according to patient size and/or use of iterative reconstruction technique. COMPARISON:  05/30/2018 FINDINGS: Brain: No acute intracranial findings are seen. There are no signs of bleeding within the cranium. Cortical sulci are prominent. There is decreased density in the periventricular white matter. Vascular: Arterial calcifications are seen. Skull: Unremarkable. Sinuses/Orbits: There is mild mucosal thickening in the ethmoid sinus. Other: None. IMPRESSION: No acute  intracranial findings are seen in noncontrast CT brain. Atrophy. Small-vessel disease. Electronically Signed   By: Elmer Picker M.D.   On: 06/27/2021 11:29   CT LUNG MASS BIOPSY  Result Date: 06/09/2021 CLINICAL DATA:  Hypermetabolic left lower Lobe mass. History of supraglottic neoplasm. EXAM: CT GUIDED CORE BIOPSY OF LEFT LOWER LOBE LUNG MASS ANESTHESIA/SEDATION: Intravenous Fentanyl 89mcg and Versed 1.$RemoveBefo'5mg'khjSfUpasBs$  were administered as conscious sedation during continuous monitoring of the patient's level of consciousness and physiological / cardiorespiratory status by the radiology RN, with a total moderate sedation time of 10 minutes. PROCEDURE: The procedure risks, benefits, and alternatives were explained to the patient. Questions regarding the procedure were encouraged and answered. The patient understands and consents to the procedure. Patient placed left lateral decubitus. Select axial scans through the chest were performed in the left lower lobe lesion was localized. An appropriate skin site was determined and marked. The operative field was prepped with chlorhexidinein a sterile fashion, and a sterile drape was applied covering the operative field. A sterile gown and sterile gloves were used for the procedure. Local anesthesia was provided with 1% Lidocaine. Under CT fluoroscopic guidance, a 17 gauge trocar needle was advanced to the margin of the lesion. Once needle tip position was confirmed, coaxial 18-gauge core biopsy samples were obtained, submitted in formalin to surgical pathology. The guide needle was removed. No pneumothorax or significant regional alveolar hemorrhage. Follow-up scan 6 minutes later stable. The patient tolerated the procedure well. COMPLICATIONS: None immediate FINDINGS: Left lower lobe mass was localized. Representative core biopsy samples obtained as above. No immediate complication. IMPRESSION: 1. Technically successful CT-guided core biopsy, left lower lobe lung mass.  RADIATION DOSE REDUCTION: This exam was performed according to the departmental dose-optimization program which includes automated exposure control, adjustment of the mA and/or kV according to patient size and/or use of iterative reconstruction technique. Electronically Signed   By: Lucrezia Europe M.D.   On: 06/09/2021 14:59   DG Chest Port 1 View  Result Date: 06/09/2021 CLINICAL DATA:  Status post left-sided biopsy EXAM: PORTABLE CHEST 1 VIEW COMPARISON:  07/26/2020 chest radiograph. FINDINGS: Stable cardiomediastinal silhouette with normal heart size. No pneumothorax. No pleural effusion. No pulmonary edema. Patchy nodular left retrocardiac opacity. Fiducial marker again noted in the medial upper right chest. IMPRESSION: 1. No pneumothorax. 2. Patchy nodular left retrocardiac opacity, compatible with known pulmonary nodule in this location. Electronically Signed   By: Ilona Sorrel M.D.   On: 06/09/2021 09:52     ASSESSMENT:  1.  T3N2C supraglottic squamous cell carcinoma: -Laryngoscopy and biopsy on 01/02/2019, trach placed on 02/18/2019. -PET scan on 01/27/2019 showed hypermetabolic laryngeal lesion with hypermetabolic cervical metastatic disease bilaterally. -Weekly cisplatin and radiation started on 03/07/2019, many interruptions due to hospitalizations. -XRT completed  on 05/08/2019 and last weekly cisplatin on 05/15/2019. -CT angio chest on 10/20/2019 showed 6 mm right upper lobe lung nodule increased in size from prior PET scan.  Resolved subpleural nodules laterally in the right upper lobe.  Subcentimeter hyperenhancing liver lesions. -PET scan on 11/13/2059 showed moderate hypermetabolism noted in the prior tracheostomy tract, likely granulation tissue.  No lymphadenopathy in the neck.  6 mm right upper lobe nodule is mildly hypermetabolic with SUV 2.18.  5 mm left axillary lymph node with SUV 3.49.  6.5 mm precarinal lymph node slightly enlarged from 5 mm.  Difficult to measure bilateral hilar lymph  nodes are hypermetabolic.   2.  Nutrition: -He has a PEG tube.  He is using Osmolite 1.53 to 4 cans/day.   3.  CVA: -She had pontine stroke with large vessel occlusion. -He had bleeding from trach site while on Plavix which was held since 03/04/2019.  4.  Squamous cell carcinoma of the lung: - SBRT of the right upper lobe and left lower lobe lesions on 08/21/2020 - PET/CT on 05/13/2021: Focal hypermetabolism in the left pyriform sinus with possible soft tissue asymmetry.  No evidence of cervical nodal hypermetabolic metastasis.  Hypermetabolism in the enlarging 2.3 cm (SUV 9.1) left lower lobe pulmonary nodule.  Mild enlargement of subcarinal lymph node measuring 1 cm with SUV 4.1 (previously 9 mm with SUV 2.9).  Right lower lobe focus of hypermetabolism may correspond to minimal interstitial thickening with SUV 2.8 similar in CT morphology to 07/02/2020.  No evidence of extrathoracic metastasis. - Left lower lobe lung biopsy (06/09/2021): Poorly differentiated squamous cell carcinoma.  IHC shows tumor cells positive for p40, p63 and negative for TTF-1, Napsin A and synaptophysin and chromogranin.  P16 is negative.   PLAN:  1.  Supraglottic squamous cell carcinoma: -PET CT scan showed focal hypermetabolic some in the left pyriform sinus with possible soft tissue asymmetry.  No evidence of cervical lymph node metastasis.     2.  Recurrent squamous cell lung cancer: - He is status post SBRT of the right upper lobe and left lower lobe lung lesions on 08/21/2020. - PET/CT on 05/13/2021: Focal hypermetabolism in the left pyriform sinus with possible soft tissue asymmetry.  No evidence of cervical nodal hypermetabolic metastasis.  Hypermetabolism in the enlarging 2.3 cm (SUV 9.1) left lower lobe pulmonary nodule.  Mild enlargement of subcarinal lymph node measuring 1 cm with SUV 4.1 (previously 9 mm with SUV 2.9).  Right lower lobe focus of hypermetabolism may correspond to minimal interstitial thickening with  SUV 2.8 similar in CT morphology to 07/02/2020.  No evidence of extrathoracic metastasis. - Left lower lobe lung biopsy (06/09/2021): Poorly differentiated squamous cell carcinoma.  IHC shows tumor cells positive for p40, p63 and negative for TTF-1, Napsin A and synaptophysin and chromogranin.  P16 is negative. - PD-L1 22 C3 is 0%. - He is a scheduled to see Dr. Jannet Askew at [facility] for possible microwave ablation on 07/20/2021. - I have recommended NGS testing on the biopsy. - We will discuss systemic therapy based on feasibility of microwave ablation and results of NGS testing.   Orders placed this encounter:  No orders of the defined types were placed in this encounter.    Doreatha Massed, MD [facility] [phone]   I, Alda Ponder, am acting as a scribe for Dr. Doreatha Massed.  I, Doreatha Massed MD, have reviewed the above documentation for accuracy and completeness, and I agree with the above.

## 2021-06-29 NOTE — Patient Instructions (Addendum)
[place] at [facility] Discharge Instructions  You were seen and examined today by Dr. Delton Coombes.  Dr. Delton Coombes discussed your most recent biopsy, which revealed squamous cell carcinoma. This is the same type of cancer that was previously noted for your head and neck cancer as well as lung cancer. This is believed to be related to your lung cancer.  Dr. Delton Coombes will order additional testing known as Next Generation Sequencing testing on the most recent biopsy. This will result in about a month.  Follow-up as scheduled.  Thank you for choosing [place] at [facility] to provide your oncology and hematology care.  To afford each patient quality time with our provider, please arrive at least 15 minutes before your scheduled appointment time.   If you have a lab appointment with the [facility] please come in thru the Main Entrance and check in at the main information desk.  You need to re-schedule your appointment should you arrive 10 or more minutes late.  We strive to give you quality time with our providers, and arriving late affects you and other patients whose appointments are after yours.  Also, if you no show three or more times for appointments you may be dismissed from the clinic at the providers discretion.     Again, thank you for choosing [facility].  Our hope is that these requests will decrease the amount of time that you wait before being seen by our physicians.       _____________________________________________________________  Should you have questions after your visit to [facility], please contact our office at [phone] and follow the prompts.  Our office hours are 8:00 a.m. and 4:30 p.m. Monday - Friday.  Please note that voicemails left after 4:00 p.m. may not be returned until the following business day.  We are closed weekends and major holidays.  You do have access to a nurse 24-7,  just call the main number to the clinic [phone] and do not press any options, hold on the line and a nurse will answer the phone.    For prescription refill requests, have your pharmacy contact our office and allow 72 hours.

## 2021-07-01 ENCOUNTER — Encounter (HOSPITAL_COMMUNITY): Payer: Self-pay

## 2021-07-01 NOTE — Progress Notes (Signed)
Caris testing sent on [id number] per Dr. Delton Coombes

## 2021-07-12 DIAGNOSIS — R748 Abnormal levels of other serum enzymes: Secondary | ICD-10-CM | POA: Diagnosis not present

## 2021-07-12 DIAGNOSIS — E039 Hypothyroidism, unspecified: Secondary | ICD-10-CM | POA: Diagnosis not present

## 2021-07-19 ENCOUNTER — Encounter (HOSPITAL_COMMUNITY): Payer: Self-pay

## 2021-07-19 DIAGNOSIS — C3432 Malignant neoplasm of lower lobe, left bronchus or lung: Secondary | ICD-10-CM | POA: Diagnosis not present

## 2021-07-19 DIAGNOSIS — Z5181 Encounter for therapeutic drug level monitoring: Secondary | ICD-10-CM | POA: Diagnosis not present

## 2021-07-19 DIAGNOSIS — C7802 Secondary malignant neoplasm of left lung: Secondary | ICD-10-CM | POA: Diagnosis not present

## 2021-07-24 ENCOUNTER — Other Ambulatory Visit (HOSPITAL_COMMUNITY): Payer: Self-pay | Admitting: Hematology

## 2021-07-25 ENCOUNTER — Other Ambulatory Visit (HOSPITAL_COMMUNITY): Payer: Self-pay | Admitting: *Deleted

## 2021-07-25 DIAGNOSIS — I10 Essential (primary) hypertension: Secondary | ICD-10-CM | POA: Diagnosis not present

## 2021-07-25 DIAGNOSIS — G894 Chronic pain syndrome: Secondary | ICD-10-CM | POA: Diagnosis not present

## 2021-07-25 DIAGNOSIS — C321 Malignant neoplasm of supraglottis: Secondary | ICD-10-CM | POA: Diagnosis not present

## 2021-07-25 MED ORDER — DRONABINOL 5 MG PO CAPS: ORAL_CAPSULE | ORAL | 3 refills | Status: DC

## 2021-07-27 ENCOUNTER — Ambulatory Visit: Payer: Medicare Other | Admitting: Pulmonary Disease

## 2021-07-27 ENCOUNTER — Telehealth: Payer: Self-pay | Admitting: Pulmonary Disease

## 2021-07-27 NOTE — Telephone Encounter (Signed)
I called and spoke with wife and she is not coming to the appointment as she was not sure if he would need to be evaluated. She has been trying to call all day and reschedule his appointment. Please advise.

## 2021-08-03 ENCOUNTER — Inpatient Hospital Stay: Payer: Medicare Other | Attending: Hematology

## 2021-08-03 ENCOUNTER — Ambulatory Visit (HOSPITAL_COMMUNITY)
Admission: RE | Admit: 2021-08-03 | Discharge: 2021-08-03 | Disposition: A | Discharge status: Home / Self Care | Payer: Medicare Other | Source: Ambulatory Visit | Attending: Hematology | Admitting: Hematology

## 2021-08-03 DIAGNOSIS — Z79899 Other long term (current) drug therapy: Secondary | ICD-10-CM | POA: Diagnosis not present

## 2021-08-03 DIAGNOSIS — Z87891 Personal history of nicotine dependence: Secondary | ICD-10-CM | POA: Diagnosis not present

## 2021-08-03 DIAGNOSIS — Z8673 Personal history of transient ischemic attack (TIA), and cerebral infarction without residual deficits: Secondary | ICD-10-CM | POA: Diagnosis not present

## 2021-08-03 DIAGNOSIS — M549 Dorsalgia, unspecified: Secondary | ICD-10-CM | POA: Diagnosis not present

## 2021-08-03 DIAGNOSIS — J384 Edema of larynx: Secondary | ICD-10-CM | POA: Diagnosis not present

## 2021-08-03 DIAGNOSIS — C321 Malignant neoplasm of supraglottis: Secondary | ICD-10-CM | POA: Insufficient documentation

## 2021-08-03 DIAGNOSIS — I672 Cerebral atherosclerosis: Secondary | ICD-10-CM | POA: Diagnosis not present

## 2021-08-03 DIAGNOSIS — G8929 Other chronic pain: Secondary | ICD-10-CM | POA: Diagnosis not present

## 2021-08-03 DIAGNOSIS — C760 Malignant neoplasm of head, face and neck: Secondary | ICD-10-CM

## 2021-08-03 DIAGNOSIS — Z800 Family history of malignant neoplasm of digestive organs: Secondary | ICD-10-CM | POA: Diagnosis not present

## 2021-08-03 DIAGNOSIS — Z930 Tracheostomy status: Secondary | ICD-10-CM | POA: Insufficient documentation

## 2021-08-03 DIAGNOSIS — Z85118 Personal history of other malignant neoplasm of bronchus and lung: Secondary | ICD-10-CM | POA: Diagnosis not present

## 2021-08-03 DIAGNOSIS — C3411 Malignant neoplasm of upper lobe, right bronchus or lung: Secondary | ICD-10-CM | POA: Diagnosis not present

## 2021-08-03 DIAGNOSIS — Z931 Gastrostomy status: Secondary | ICD-10-CM | POA: Diagnosis not present

## 2021-08-03 DIAGNOSIS — C3432 Malignant neoplasm of lower lobe, left bronchus or lung: Secondary | ICD-10-CM | POA: Insufficient documentation

## 2021-08-03 DIAGNOSIS — Z803 Family history of malignant neoplasm of breast: Secondary | ICD-10-CM | POA: Diagnosis not present

## 2021-08-03 LAB — COMPREHENSIVE METABOLIC PANEL
ALT: 10 U/L (ref 0–44)
AST: 16 U/L (ref 15–41)
Albumin: 3.8 g/dL (ref 3.5–5.0)
Alkaline Phosphatase: 68 U/L (ref 38–126)
Anion gap: 9 (ref 5–15)
BUN: 10 mg/dL (ref 6–20)
CO2: 25 mmol/L (ref 22–32)
Calcium: 9.4 mg/dL (ref 8.9–10.3)
Chloride: 92 mmol/L — ABNORMAL LOW (ref 98–111)
Creatinine, Ser: 0.9 mg/dL (ref 0.61–1.24)
GFR, Estimated: >60 mL/min (ref >60)
Glucose, Bld: 108 mg/dL — ABNORMAL HIGH (ref 70–99)
Potassium: 3.9 mmol/L (ref 3.5–5.1)
Sodium: 126 mmol/L — ABNORMAL LOW (ref 135–145)
Total Bilirubin: 0.5 mg/dL (ref 0.3–1.2)
Total Protein: 7.8 g/dL (ref 6.5–8.1)

## 2021-08-03 LAB — CBC WITH DIFFERENTIAL/PLATELET
Abs Immature Granulocytes: 0.04 10*3/uL (ref 0.00–0.07)
Basophils Absolute: 0 10*3/uL (ref 0.0–0.1)
Basophils Relative: 0 %
Eosinophils Absolute: 0.4 10*3/uL (ref 0.0–0.5)
Eosinophils Relative: 4 %
HCT: 42.3 % (ref 39.0–52.0)
Hemoglobin: 14.6 g/dL (ref 13.0–17.0)
Immature Granulocytes: 1 %
Lymphocytes Relative: 17 %
Lymphs Abs: 1.4 10*3/uL (ref 0.7–4.0)
MCH: 30.5 pg (ref 26.0–34.0)
MCHC: 34.5 g/dL (ref 30.0–36.0)
MCV: 88.5 fL (ref 80.0–100.0)
Monocytes Absolute: 1.1 10*3/uL — ABNORMAL HIGH (ref 0.1–1.0)
Monocytes Relative: 13 %
Neutro Abs: 5.5 10*3/uL (ref 1.7–7.7)
Neutrophils Relative %: 65 %
Platelets: 198 10*3/uL (ref 150–400)
RBC: 4.78 MIL/uL (ref 4.22–5.81)
RDW: 13.3 % (ref 11.5–15.5)
WBC: 8.4 10*3/uL (ref 4.0–10.5)
nRBC: 0 % (ref 0.0–0.2)

## 2021-08-03 LAB — TSH: TSH: 1.781 u[IU]/mL (ref 0.350–4.500)

## 2021-08-03 MED ORDER — IOHEXOL 300 MG/ML  SOLN
75.0000 mL | Freq: Once | INTRAMUSCULAR | Status: AC | PRN
  Administered 2021-08-03: 75 mL via INTRAVENOUS

## 2021-08-05 DIAGNOSIS — E871 Hypo-osmolality and hyponatremia: Secondary | ICD-10-CM | POA: Diagnosis not present

## 2021-08-08 DIAGNOSIS — I69354 Hemiplegia and hemiparesis following cerebral infarction affecting left non-dominant side: Secondary | ICD-10-CM | POA: Diagnosis not present

## 2021-08-08 DIAGNOSIS — I2510 Atherosclerotic heart disease of native coronary artery without angina pectoris: Secondary | ICD-10-CM | POA: Diagnosis not present

## 2021-08-08 DIAGNOSIS — J449 Chronic obstructive pulmonary disease, unspecified: Secondary | ICD-10-CM | POA: Diagnosis not present

## 2021-08-08 DIAGNOSIS — R918 Other nonspecific abnormal finding of lung field: Secondary | ICD-10-CM | POA: Diagnosis not present

## 2021-08-08 DIAGNOSIS — Z923 Personal history of irradiation: Secondary | ICD-10-CM | POA: Diagnosis not present

## 2021-08-08 DIAGNOSIS — Z9889 Other specified postprocedural states: Secondary | ICD-10-CM | POA: Diagnosis not present

## 2021-08-08 DIAGNOSIS — Z955 Presence of coronary angioplasty implant and graft: Secondary | ICD-10-CM | POA: Diagnosis not present

## 2021-08-08 DIAGNOSIS — E785 Hyperlipidemia, unspecified: Secondary | ICD-10-CM | POA: Diagnosis not present

## 2021-08-08 DIAGNOSIS — Z87891 Personal history of nicotine dependence: Secondary | ICD-10-CM | POA: Diagnosis not present

## 2021-08-08 DIAGNOSIS — Z8521 Personal history of malignant neoplasm of larynx: Secondary | ICD-10-CM | POA: Diagnosis not present

## 2021-08-08 DIAGNOSIS — I6523 Occlusion and stenosis of bilateral carotid arteries: Secondary | ICD-10-CM | POA: Diagnosis not present

## 2021-08-08 DIAGNOSIS — Z79899 Other long term (current) drug therapy: Secondary | ICD-10-CM | POA: Diagnosis not present

## 2021-08-08 DIAGNOSIS — E871 Hypo-osmolality and hyponatremia: Secondary | ICD-10-CM | POA: Diagnosis not present

## 2021-08-08 DIAGNOSIS — E039 Hypothyroidism, unspecified: Secondary | ICD-10-CM | POA: Diagnosis not present

## 2021-08-08 DIAGNOSIS — K219 Gastro-esophageal reflux disease without esophagitis: Secondary | ICD-10-CM | POA: Diagnosis not present

## 2021-08-08 DIAGNOSIS — C7802 Secondary malignant neoplasm of left lung: Secondary | ICD-10-CM | POA: Diagnosis not present

## 2021-08-10 ENCOUNTER — Ambulatory Visit (HOSPITAL_COMMUNITY): Payer: Medicare Other | Admitting: Hematology

## 2021-08-15 DIAGNOSIS — R918 Other nonspecific abnormal finding of lung field: Secondary | ICD-10-CM | POA: Diagnosis not present

## 2021-08-15 DIAGNOSIS — R911 Solitary pulmonary nodule: Secondary | ICD-10-CM | POA: Diagnosis not present

## 2021-08-15 DIAGNOSIS — Z79899 Other long term (current) drug therapy: Secondary | ICD-10-CM | POA: Diagnosis not present

## 2021-08-15 DIAGNOSIS — C7802 Secondary malignant neoplasm of left lung: Secondary | ICD-10-CM | POA: Diagnosis not present

## 2021-08-15 DIAGNOSIS — C3432 Malignant neoplasm of lower lobe, left bronchus or lung: Secondary | ICD-10-CM | POA: Diagnosis not present

## 2021-08-15 DIAGNOSIS — E871 Hypo-osmolality and hyponatremia: Secondary | ICD-10-CM | POA: Diagnosis not present

## 2021-08-15 DIAGNOSIS — Z9889 Other specified postprocedural states: Secondary | ICD-10-CM | POA: Diagnosis not present

## 2021-08-15 DIAGNOSIS — I6523 Occlusion and stenosis of bilateral carotid arteries: Secondary | ICD-10-CM | POA: Diagnosis not present

## 2021-08-15 DIAGNOSIS — Z955 Presence of coronary angioplasty implant and graft: Secondary | ICD-10-CM | POA: Diagnosis not present

## 2021-08-15 DIAGNOSIS — Z8521 Personal history of malignant neoplasm of larynx: Secondary | ICD-10-CM | POA: Diagnosis not present

## 2021-08-15 DIAGNOSIS — I69354 Hemiplegia and hemiparesis following cerebral infarction affecting left non-dominant side: Secondary | ICD-10-CM | POA: Diagnosis not present

## 2021-08-15 DIAGNOSIS — Z923 Personal history of irradiation: Secondary | ICD-10-CM | POA: Diagnosis not present

## 2021-08-15 DIAGNOSIS — Z87891 Personal history of nicotine dependence: Secondary | ICD-10-CM | POA: Diagnosis not present

## 2021-08-15 DIAGNOSIS — I2510 Atherosclerotic heart disease of native coronary artery without angina pectoris: Secondary | ICD-10-CM | POA: Diagnosis not present

## 2021-08-15 DIAGNOSIS — K219 Gastro-esophageal reflux disease without esophagitis: Secondary | ICD-10-CM | POA: Diagnosis not present

## 2021-08-15 DIAGNOSIS — J449 Chronic obstructive pulmonary disease, unspecified: Secondary | ICD-10-CM | POA: Diagnosis not present

## 2021-08-15 DIAGNOSIS — E785 Hyperlipidemia, unspecified: Secondary | ICD-10-CM | POA: Diagnosis not present

## 2021-08-15 DIAGNOSIS — E039 Hypothyroidism, unspecified: Secondary | ICD-10-CM | POA: Diagnosis not present

## 2021-08-22 ENCOUNTER — Other Ambulatory Visit: Payer: Self-pay

## 2021-08-22 DIAGNOSIS — C321 Malignant neoplasm of supraglottis: Secondary | ICD-10-CM

## 2021-08-23 ENCOUNTER — Telehealth: Payer: Self-pay | Admitting: *Deleted

## 2021-08-23 NOTE — Telephone Encounter (Signed)
Called patient to inform of Pet Scan for 08-25-21- arrival time- 12:45 pm @ [facility], patient to have nothing but water- 6 hrs. prior to test, patient to receive results from Dr. Isidore Moos on 09-02-21 @ 2 pm, spoke with patient's wife - [name] and she is aware of these appts. and the instructions

## 2021-08-24 DIAGNOSIS — C321 Malignant neoplasm of supraglottis: Secondary | ICD-10-CM | POA: Diagnosis not present

## 2021-08-24 DIAGNOSIS — I10 Essential (primary) hypertension: Secondary | ICD-10-CM | POA: Diagnosis not present

## 2021-08-24 DIAGNOSIS — G894 Chronic pain syndrome: Secondary | ICD-10-CM | POA: Diagnosis not present

## 2021-08-25 ENCOUNTER — Encounter (HOSPITAL_COMMUNITY)
Admission: RE | Admit: 2021-08-25 | Discharge: 2021-08-25 | Disposition: A | Discharge status: Home / Self Care | Payer: Medicare Other | Source: Ambulatory Visit | Attending: Radiation Oncology | Admitting: Radiation Oncology

## 2021-08-25 DIAGNOSIS — C321 Malignant neoplasm of supraglottis: Secondary | ICD-10-CM | POA: Diagnosis not present

## 2021-08-25 DIAGNOSIS — C760 Malignant neoplasm of head, face and neck: Secondary | ICD-10-CM | POA: Diagnosis not present

## 2021-08-25 MED ORDER — FLUDEOXYGLUCOSE F - 18 (FDG) INJECTION
8.5100 | Freq: Once | INTRAVENOUS | Status: AC | PRN
Start: 2021-08-25 — End: 2021-08-25
  Administered 2021-08-25: 8.51 via INTRAVENOUS

## 2021-08-30 DIAGNOSIS — Z8521 Personal history of malignant neoplasm of larynx: Secondary | ICD-10-CM | POA: Diagnosis not present

## 2021-08-30 DIAGNOSIS — C3432 Malignant neoplasm of lower lobe, left bronchus or lung: Secondary | ICD-10-CM | POA: Diagnosis not present

## 2021-09-01 ENCOUNTER — Inpatient Hospital Stay (HOSPITAL_BASED_OUTPATIENT_CLINIC_OR_DEPARTMENT_OTHER): Payer: Medicare Other | Admitting: Hematology

## 2021-09-01 VITALS — BP 110/71 | HR 53 | Temp 98.2°F | Resp 18 | Wt 154.5 lb

## 2021-09-01 DIAGNOSIS — Z87891 Personal history of nicotine dependence: Secondary | ICD-10-CM | POA: Diagnosis not present

## 2021-09-01 DIAGNOSIS — M549 Dorsalgia, unspecified: Secondary | ICD-10-CM | POA: Diagnosis not present

## 2021-09-01 DIAGNOSIS — Z79899 Other long term (current) drug therapy: Secondary | ICD-10-CM | POA: Diagnosis not present

## 2021-09-01 DIAGNOSIS — Z803 Family history of malignant neoplasm of breast: Secondary | ICD-10-CM | POA: Diagnosis not present

## 2021-09-01 DIAGNOSIS — C7802 Secondary malignant neoplasm of left lung: Secondary | ICD-10-CM

## 2021-09-01 DIAGNOSIS — Z931 Gastrostomy status: Secondary | ICD-10-CM | POA: Diagnosis not present

## 2021-09-01 DIAGNOSIS — Z930 Tracheostomy status: Secondary | ICD-10-CM | POA: Diagnosis not present

## 2021-09-01 DIAGNOSIS — C321 Malignant neoplasm of supraglottis: Secondary | ICD-10-CM | POA: Diagnosis not present

## 2021-09-01 DIAGNOSIS — Z800 Family history of malignant neoplasm of digestive organs: Secondary | ICD-10-CM | POA: Diagnosis not present

## 2021-09-01 DIAGNOSIS — C3432 Malignant neoplasm of lower lobe, left bronchus or lung: Secondary | ICD-10-CM | POA: Diagnosis not present

## 2021-09-01 DIAGNOSIS — G8929 Other chronic pain: Secondary | ICD-10-CM | POA: Diagnosis not present

## 2021-09-01 DIAGNOSIS — Z8673 Personal history of transient ischemic attack (TIA), and cerebral infarction without residual deficits: Secondary | ICD-10-CM | POA: Diagnosis not present

## 2021-09-01 DIAGNOSIS — C3411 Malignant neoplasm of upper lobe, right bronchus or lung: Secondary | ICD-10-CM | POA: Diagnosis not present

## 2021-09-01 NOTE — Progress Notes (Addendum)
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 He was recently started on an albuterol inhaler, but reports he hardly has to use it. Smoking or chewing tobacco? None Using fluoride trays daily?  N/A--has full set of dentures Last ENT visit was on: Saw Dr. [name] on 08/30/2021  Other notable issues, if any: Saw his medical oncologist Dr. Delton Coombes yesterday 09/01/21. Overall reports he's doing well and denies any new issues/concerns.

## 2021-09-01 NOTE — Patient Instructions (Addendum)
[place] at [facility] Discharge Instructions   You were seen and examined today by Dr. Delton Coombes.  He discussed the results of special testing we sent on your tumor that did not show any mutations to target. Dr. Raliegh Ip was also looking to see if you would be a candidate for immunotherapy to treat the cancer. Unfortunately, it was not shown that immunotherapy would benefit you.   Continue to follow with Dr. Isidore Moos. She can arrange for you to have scans.   Return to clinic as needed.    Thank you for choosing [place] at [facility] to provide your oncology and hematology care.  To afford each patient quality time with our provider, please arrive at least 15 minutes before your scheduled appointment time.   If you have a lab appointment with the [facility] please come in thru the Main Entrance and check in at the main information desk.  You need to re-schedule your appointment should you arrive 10 or more minutes late.  We strive to give you quality time with our providers, and arriving late affects you and other patients whose appointments are after yours.  Also, if you no show three or more times for appointments you may be dismissed from the clinic at the providers discretion.     Again, thank you for choosing [facility].  Our hope is that these requests will decrease the amount of time that you wait before being seen by our physicians.       _____________________________________________________________  Should you have questions after your visit to [facility], please contact our office at [phone] and follow the prompts.  Our office hours are 8:00 a.m. and 4:30 p.m. Monday - Friday.  Please note that voicemails left after 4:00 p.m. may not be returned until the following business day.  We are closed weekends and major holidays.  You do have access to a nurse 24-7, just call the main number to the clinic  [phone] and do not press any options, hold on the line and a nurse will answer the phone.    For prescription refill requests, have your pharmacy contact our office and allow 72 hours.    Due to Covid, you will need to wear a mask upon entering the hospital. If you do not have a mask, a mask will be given to you at the Main Entrance upon arrival. For doctor visits, patients may have 1 support person age 5 or older with them. For treatment visits, patients can not have anyone with them due to social distancing guidelines and our immunocompromised population.

## 2021-09-01 NOTE — Progress Notes (Signed)
[facility] [address], [place] [address]   CLINIC:  Medical Oncology/Hematology  PCP:  Elfredia Nevins, MD [address] / [place] [address] [phone]   REASON FOR VISIT:  Follow-up for supraglottic squamous cell carcinoma  PRIOR THERAPY: Chemoradiation with weekly cisplatin from 03/07/2019 to 05/15/2019  NGS Results: not done  CURRENT THERAPY: surveillance  BRIEF ONCOLOGIC HISTORY:  Oncology History  Malignant neoplasm of supraglottis (HCC)  01/22/2019 Initial Diagnosis   Malignant neoplasm of supraglottis (HCC)   01/30/2019 Cancer Staging   Staging form: Larynx - Supraglottis, AJCC 8th Edition - Clinical: Stage IVA (cT2, cN2c, cM0) - Signed by Doreatha Massed, MD on 01/30/2019   03/07/2019 - 05/15/2019 Chemotherapy   Patient is on Treatment Plan : HEAD/NECK Cisplatin q7d       CANCER STAGING:  Cancer Staging  Malignant neoplasm of supraglottis (HCC) Staging form: Larynx - Supraglottis, AJCC 8th Edition - Clinical: Stage IVA (cT2, cN2c, cM0) - Signed by Doreatha Massed, MD on 01/30/2019   INTERVAL HISTORY:  [name], a 61 y.o. male, seen for follow-up of lung cancer and supraglottic squamous cell carcinoma.  He was evaluated at [facility] and felt not a candidate for cryoablation.  He had PET scan done for restaging of the cancer.  Reports chronic back pain is stable.  REVIEW OF SYSTEMS:  Review of Systems  Constitutional:  Negative for appetite change and fatigue.  Cardiovascular:  Negative for chest pain.  Gastrointestinal:  Negative for constipation.  Musculoskeletal:  Negative for back pain.  Neurological:  Positive for headaches. Negative for dizziness.  Psychiatric/Behavioral:  Positive for depression. The patient is nervous/anxious.   All other systems reviewed and are negative.   PAST MEDICAL/SURGICAL HISTORY:  Past Medical History:  Diagnosis Date   Anemia    Anxiety    Arthritis     Cancer of larynx (HCC)    Carotid artery stenosis    Chronic lower back pain    Coronary artery disease    a. Diag cath 10/2012 for CP/abnormal nuc -> PCI s/p LAD atherectomy/DES placement 11/05/12.   Depression    Dilated aortic root (HCC)    GERD (gastroesophageal reflux disease)    History of hiatal hernia    Hypercholesteremia    Hypertension    Pneumonia    S/P percutaneous endoscopic gastrostomy (PEG) tube placement [facility])    Stroke (HCC) 07/31/2018   Pons   Past Surgical History:  Procedure Laterality Date   BIOPSY  11/24/2019   Procedure: BIOPSY;  Surgeon: Lemar Lofty., MD;  Location: [facility] ENDOSCOPY;  Service: Gastroenterology;;   BIOPSY  03/29/2020   Procedure: BIOPSY;  Surgeon: Lemar Lofty., MD;  Location: [facility] ENDOSCOPY;  Service: Gastroenterology;;   BRONCHIAL BIOPSY  07/26/2020   Procedure: BRONCHIAL BIOPSIES;  Surgeon: Josephine Igo, DO;  Location: [facility] ENDOSCOPY;  Service: Pulmonary;;   BRONCHIAL BRUSHINGS  07/26/2020   Procedure: BRONCHIAL BRUSHINGS;  Surgeon: Josephine Igo, DO;  Location: [facility] ENDOSCOPY;  Service: Pulmonary;;   BRONCHIAL NEEDLE ASPIRATION BIOPSY  07/26/2020   Procedure: BRONCHIAL NEEDLE ASPIRATION BIOPSIES;  Surgeon: Josephine Igo, DO;  Location: [facility] ENDOSCOPY;  Service: Pulmonary;;   CARDIAC CATHETERIZATION  10/29/2012   COLONOSCOPY WITH PROPOFOL N/A 11/24/2019   Procedure: COLONOSCOPY WITH PROPOFOL;  Surgeon: Lemar Lofty., MD;  Location: [facility] ENDOSCOPY;  Service: Gastroenterology;  Laterality: N/A;   CORONARY ANGIOPLASTY WITH STENT PLACEMENT  11/05/2012   DIRECT LARYNGOSCOPY N/A 01/02/2019  Procedure: MICRO DIRECT LARYNGOSCOPY BIOPSY OF LARYNGEAL MASS;  Surgeon: [name], MD;  Location: [facility] OR;  Service: ENT;  Laterality: N/A;   ESOPHAGOGASTRODUODENOSCOPY (EGD) WITH PROPOFOL N/A 08/11/2019   Procedure: ATTEMPTED ESOPHAGOGASTRODUODENOSCOPY (EGD) WITH PROPOFOL (UNABLE TO PERFORM PERCUTANEOUS ENDOSCOPIC GASTROSTOMY TUBE  REPLACEMENT);  Surgeon: [name], MD;  Location: [facility] ORS;  Service: Gastroenterology;  Laterality: N/A;   ESOPHAGOGASTRODUODENOSCOPY (EGD) WITH PROPOFOL N/A 11/24/2019   Procedure: ESOPHAGOGASTRODUODENOSCOPY (EGD) WITH PROPOFOL;  Surgeon: Rush Landmark Telford Nab., MD;  Location: [place];  Service: Gastroenterology;  Laterality: N/A;   ESOPHAGOGASTRODUODENOSCOPY (EGD) WITH PROPOFOL N/A 01/26/2020   Procedure: ESOPHAGOGASTRODUODENOSCOPY (EGD) WITH PROPOFOL;  Surgeon: Rush Landmark Telford Nab., MD;  Location: [place];  Service: Gastroenterology;  Laterality: N/A;   ESOPHAGOGASTRODUODENOSCOPY (EGD) WITH PROPOFOL N/A 03/29/2020   Procedure: ESOPHAGOGASTRODUODENOSCOPY (EGD) WITH PROPOFOL;  Surgeon: Rush Landmark Telford Nab., MD;  Location: [facility] ENDOSCOPY;  Service: Gastroenterology;  Laterality: N/A;  NEEDS FLOURO   FIDUCIAL MARKER PLACEMENT  07/26/2020   Procedure: FIDUCIAL MARKER PLACEMENT;  Surgeon: Garner [place], DO;  Location: [place] ENDOSCOPY;  Service: Pulmonary;;   HEMORRHOID SURGERY  ~ 2010   INSERTION OF MESH N/A 06/02/2015   Procedure: INSERTION OF MESH;  Surgeon: [name], MD;  Location: [facility] ORS;  Service: General;  Laterality: N/A;   IR ANGIO INTRA EXTRACRAN SEL COM CAROTID INNOMINATE BILAT MOD SED  08/02/2018   IR ANGIO VERTEBRAL SEL VERTEBRAL BILAT MOD SED  08/02/2018   IR REPLACE G-TUBE SIMPLE WO FLUORO  08/12/2019   IR [facility] GASTRO/COLONIC TUBE PERCUT W/FLUORO  08/13/2019   LAPAROSCOPIC INSERTION GASTROSTOMY TUBE N/A 02/24/2019   Procedure: LAPAROSCOPIC INSERTION GASTROSTOMY TUBE;  Surgeon: [name], MD;  Location: [place];  Service: General;  Laterality: N/A;   LEFT HEART CATHETERIZATION WITH CORONARY ANGIOGRAM N/A 10/30/2012   Procedure: LEFT HEART CATHETERIZATION WITH CORONARY ANGIOGRAM;  Surgeon: [name], MD;  Location: [place] CATH LAB;  Service: Cardiovascular;  Laterality: N/A;   MULTIPLE EXTRACTIONS WITH ALVEOLOPLASTY N/A 02/18/2019   Procedure: Extraction of tooth #'s  5-7, 10,11,14,20-29, 31 and 32 with alveoloplasty and maxiillary left buccal exostoses reductions;  Surgeon: [name], [name];  Location: [place];  Service: Oral Surgery;  Laterality: N/A;   PEG PLACEMENT N/A 01/26/2020   Procedure: PERCUTANEOUS ENDOSCOPIC GASTROSTOMY (PEG) REPLACEMENT;  Surgeon: Irving Copas., MD;  Location: [place];  Service: Gastroenterology;  Laterality: N/A;   PERCUTANEOUS CORONARY ROTOBLATOR INTERVENTION (PCI-R) N/A 11/05/2012   Procedure: PERCUTANEOUS CORONARY ROTOBLATOR INTERVENTION (PCI-R);  Surgeon: [name], MD;  Location: [facility] CATH LAB;  Service: Cardiovascular;  Laterality: N/A;   SAVORY DILATION N/A 11/24/2019   Procedure: SAVORY DILATION;  Surgeon: Rush Landmark Telford Nab., MD;  Location: [place];  Service: Gastroenterology;  Laterality: N/A;   SAVORY DILATION N/A 01/26/2020   Procedure: SAVORY DILATION;  Surgeon: Rush Landmark Telford Nab., MD;  Location: [place];  Service: Gastroenterology;  Laterality: N/A;   TRACHEOSTOMY TUBE PLACEMENT N/A 02/18/2019   Procedure: Tracheostomy;  Surgeon: [name], MD;  Location: [place];  Service: ENT;  Laterality: N/A;   UMBILICAL HERNIA REPAIR N/A 06/02/2015   Procedure: UMBILICAL HERNIORRHAPHY WITH MESH;  Surgeon: [name], MD;  Location: [facility] ORS;  Service: General;  Laterality: N/A;   VIDEO BRONCHOSCOPY WITH ENDOBRONCHIAL NAVIGATION Bilateral 07/26/2020   Procedure: VIDEO BRONCHOSCOPY WITH ENDOBRONCHIAL NAVIGATION;  Surgeon: Garner [place], DO;  Location: [place];  Service: Pulmonary;  Laterality: Bilateral;  ION   VIDEO BRONCHOSCOPY WITH RADIAL ENDOBRONCHIAL ULTRASOUND  07/26/2020   Procedure:  VIDEO BRONCHOSCOPY WITH RADIAL ENDOBRONCHIAL ULTRASOUND;  Surgeon: Garner [place], DO;  Location: [place];  Service: Pulmonary;;    SOCIAL HISTORY:  Social History   Socioeconomic History   Marital status: Married    Spouse name: [name]   Number of children: 0   Years of education: Not on  file   Highest education level: Not on file  Occupational History   Occupation: Disabled    Comment: previously worked in Nurse, learning disability as an Clinical biochemist  Tobacco Use   Smoking status: Former    Packs/day: 3.00    Years: 32.00    Total pack years: 96.00    Types: Cigarettes    Start date: 08/29/1977    Quit date: 11/29/2006    Years since quitting: 14.7   Smokeless tobacco: Former    Types: Chew    Quit date: 2003   Tobacco comments:    11/05/2012 "chewed tobacco when I play ball; haven't chewed since age 53"  Vaping Use   Vaping Use: Never used  Substance and Sexual Activity   Alcohol use: Not Currently    Alcohol/week: 12.0 standard drinks of alcohol    Types: 12 Cans of beer per week    Comment: per wife [name] 6-7/week now as of 02/24/19   Drug use: Not Currently    Types: Marijuana    Comment: Last use was on - denies on 04/08/19   Sexual activity: Not on file  Other Topics Concern   Not on file  Social History Narrative   Lives with wife at home   Right Handed   Drinks 5-6 cups caffeine daily   Social Determinants of Health   Financial Resource Strain: Low Risk  (01/21/2019)   Overall Financial Resource Strain (CARDIA)    Difficulty of Paying Living Expenses: Not hard at all  Food Insecurity: No Food Insecurity (08/19/2019)   Hunger Vital Sign    Worried About Running Out of Food in the Last Year: Never true    [place] in the Last Year: Never true  Transportation Needs: No Transportation Needs (01/21/2019)   PRAPARE - Hydrologist (Medical): No    Lack of Transportation (Non-Medical): No  Physical Activity: Insufficiently Active (01/21/2019)   Exercise Vital Sign    Days of Exercise per Week: 2 days    Minutes of Exercise per Session: 30 min  Stress: Stress Concern Present (01/21/2019)   [place]    Feeling of Stress : To some extent  Social  Connections: Socially Isolated (01/21/2019)   Social Connection and Isolation Panel [NHANES]    Frequency of Communication with Friends and Family: Once a week    Frequency of Social Gatherings with Friends and Family: Once a week    Attends Religious Services: Never    Marine scientist or Organizations: No    Attends Archivist Meetings: Never    Marital Status: Married  Human resources officer Violence: Not At Risk (01/21/2019)   Humiliation, Afraid, Rape, and Kick questionnaire    Fear of Current or Ex-Partner: No    Emotionally Abused: No    Physically Abused: No    Sexually Abused: No    FAMILY HISTORY:  Family History  Problem Relation Age of Onset   Hypertension Mother    Heart disease Father    Hypertension Father    Heart disease Sister    Hypertension Maternal Grandmother  Hypertension Maternal Grandfather    Hypertension Paternal Grandmother    Hypertension Paternal Grandfather    Pancreatic cancer Maternal Uncle    Breast cancer Maternal Aunt    Breast cancer Maternal Aunt    Diabetes Paternal Uncle    Diabetes Paternal Aunt     CURRENT MEDICATIONS:  Current Outpatient Medications  Medication Sig Dispense Refill   acetaminophen (TYLENOL) 325 MG tablet Take 1.5 tablets (487.5 mg total) by mouth every 6 (six) hours as needed for mild pain, fever or headache (or Fever >/= 101).     albuterol (VENTOLIN HFA) 108 (90 Base) MCG/ACT inhaler Inhale 1-2 puffs into the lungs every 4 (four) hours as needed for wheezing or shortness of breath.     aspirin EC 81 MG tablet Take 1 tablet (81 mg total) by mouth daily with breakfast. 30 tablet 4   atorvastatin (LIPITOR) 40 MG tablet Take 80 mg by mouth at bedtime.     Baclofen 5 MG TABS Take 10 mg by mouth at bedtime. (Patient taking differently: Take 5 mg by mouth at bedtime.) 60 tablet 3   diazepam (VALIUM) 10 MG tablet Take 5-10 mg by mouth in the morning and at bedtime. $RemoveBef'5mg'UqlFSuSDxh$  in the morning, and $RemoveBefo'10mg'XLZAHtgaPLi$  at night, 1 extra  tab if needed     docusate sodium (COLACE) 100 MG capsule Take 100 mg by mouth 2 (two) times daily.     dronabinol (MARINOL) 5 MG capsule TAKE 1 CAPSULE(5 MG) BY MOUTH TWICE DAILY BEFORE A MEAL 60 capsule 3   DULoxetine (CYMBALTA) 30 MG capsule Take 30 mg by mouth daily.     HYDROcodone-acetaminophen (NORCO) 10-325 MG tablet Take 1 tablet by mouth every 4 (four) hours as needed. (Patient taking differently: Take 1 tablet by mouth every 4 (four) hours as needed for moderate pain.) 50 tablet 0   lactulose (CHRONULAC) 10 GM/15ML solution TAKE 30 MLS( 20 GIVE) BY MOUTH FOUR TIMES DAILY AS NEEDED (Patient taking differently: Take 20 g by mouth 4 (four) times daily as needed for mild constipation.) 473 mL 2   levothyroxine (SYNTHROID) 50 MCG tablet Take 50 mcg by mouth daily.     metoprolol tartrate (LOPRESSOR) 25 MG tablet Take 12.5 mg by mouth 2 (two) times daily.     omeprazole (PRILOSEC) 40 MG capsule TAKE 1 CAPSULE(40 MG) BY MOUTH DAILY (Patient taking differently: Take 40 mg by mouth daily.) 90 capsule 0   potassium chloride SA (KLOR-CON) 20 MEQ tablet Take 20 mEq by mouth daily.     valproic acid (DEPAKENE) 250 MG/5ML solution TAKE 10 ML BY MOUTH THREE TIMES DAILY (Patient taking differently: Take 500 mg by mouth 3 (three) times daily.) 900 mL 5   amLODipine (NORVASC) 5 MG tablet Take 1 tablet (5 mg total) by mouth daily. 30 tablet 0   nitroGLYCERIN (NITROSTAT) 0.4 MG SL tablet Place 1 tablet (0.4 mg total) under the tongue every 5 (five) minutes as needed for chest pain (CP or SOB). (Patient not taking: Reported on 09/01/2021) 25 tablet 3   No current facility-administered medications for this visit.    ALLERGIES:  Allergies  Allergen Reactions   Cymbalta [Duloxetine Hcl]    Duloxetine Other (See Comments)    Caused depression/ patient is taking   Prednisone Other (See Comments)    Chest pain    PHYSICAL EXAM:  Performance status (ECOG): 1 - Symptomatic but completely  ambulatory  Vitals:   09/01/21 1135  BP: 110/71  Pulse: (!) 53  Resp:  18  Temp: 98.2 F (36.8 C)  SpO2: 96%   Wt Readings from Last 3 Encounters:  09/01/21 154 lb 8 oz (70.1 kg)  06/29/21 158 lb 11.2 oz (72 kg)  06/27/21 165 lb (74.8 kg)   Physical Exam Vitals reviewed.  Constitutional:      Appearance: Normal appearance.     Comments: In wheelchair  Cardiovascular:     Rate and Rhythm: Normal rate and regular rhythm.     Pulses: Normal pulses.     Heart sounds: Normal heart sounds.  Pulmonary:     Effort: Pulmonary effort is normal.     Breath sounds: Normal breath sounds.  Neurological:     General: No focal deficit present.     Mental Status: He is alert and oriented to person, place, and time.  Psychiatric:        Mood and Affect: Mood normal.        Behavior: Behavior normal.      LABORATORY DATA:  I have reviewed the labs as listed.     Latest Ref Rng & Units 08/03/2021   10:37 AM 06/27/2021   10:26 AM 06/09/2021    6:30 AM  CBC  WBC 4.0 - 10.5 K/uL 8.4  7.1  6.0   Hemoglobin 13.0 - 17.0 g/dL 14.6  13.8  13.5   Hematocrit 39.0 - 52.0 % 42.3  40.1  39.8   Platelets 150 - 400 K/uL 198  174  188       Latest Ref Rng & Units 08/03/2021   10:37 AM 06/27/2021   10:26 AM 01/18/2021    2:30 PM  CMP  Glucose 70 - 99 mg/dL 108  90  91   BUN 6 - 20 mg/dL $Remove'10  10  12   'ZyoxpOm$ Creatinine 0.61 - 1.24 mg/dL 0.90  0.83  0.84   Sodium 135 - 145 mmol/L 126  123  127   Potassium 3.5 - 5.1 mmol/L 3.9  3.7  4.2   Chloride 98 - 111 mmol/L 92  87  91   CO2 22 - 32 mmol/L $RemoveB'25  27  30   'OncRFlyk$ Calcium 8.9 - 10.3 mg/dL 9.4  9.1  8.7   Total Protein 6.5 - 8.1 g/dL 7.8  8.0  7.0   Total Bilirubin 0.3 - 1.2 mg/dL 0.5  0.7  0.4   Alkaline Phos 38 - 126 U/L 68  89  82   AST 15 - 41 U/L $Remo'16  18  31   'IeMXB$ ALT 0 - 44 U/L $Remo'10  10  22     'PzMlp$ DIAGNOSTIC IMAGING:  I have independently reviewed the scans and discussed with the patient. NM PET Image Restag (PS) Skull Base To Thigh  Result Date:  08/27/2021 CLINICAL DATA:  Subsequent treatment strategy for history of head neck cancer with new diagnosis of left lung cancer. Recent lung biopsy. EXAM: NUCLEAR MEDICINE PET SKULL BASE TO THIGH TECHNIQUE: 8.51 mCi F-18 FDG was injected intravenously. Full-ring PET imaging was performed from the skull base to thigh after the radiotracer. CT data was obtained and used for attenuation correction and anatomic localization. Fasting blood glucose: 93 mg/dl COMPARISON:  PET-CT 05/11/2021 FINDINGS: Mediastinal blood pool activity: SUV max 2.26 Liver activity: SUV max NA NECK: Persistent hypermetabolism the region of the left piriform sinus with SUV max of 9.66. This was previously 9.29. No enlarged or hypermetabolic neck nodes. Moderate but symmetric uptake in the tongue and other head and neck muscles. Incidental CT findings: None.  CHEST: Interval enlargement of the left lower lobe pulmonary mass. Measures 3.9 x 3.1 cm and previously measured 2.5 x 1.8 cm. SUV max is 10.7 and was previously 9.06. No hypermetabolic mediastinal or hilar lymph nodes are identified. Superior segment right lower lobe pulmonary nodule measures 10 mm image 37/9. This previously measured 8 mm. It also appears more solid. SUV max is 2.80 and was previously 2.77. No new pulmonary lesions. Incidental CT findings: Stable atherosclerotic calcifications involving the aorta and coronary arteries. Stable emphysematous changes and pulmonary scarring. ABDOMEN/PELVIS: No abnormal hypermetabolic activity within the liver, pancreas, adrenal glands, or spleen. No hypermetabolic lymph nodes in the abdomen or pelvis. Incidental CT findings: Stable vascular calcifications and stable 3 cm infrarenal abdominal aortic aneurysm. Recommend follow-up ultrasound every 3 years. This recommendation follows ACR consensus guidelines: White Paper of the ACR Incidental Findings Committee II on Vascular Findings. J Am Coll Radiol 2013; 10:789-794. Moderately thick-walled  bladder possibly due to bladder outlet obstruction. This is a stable finding. SKELETON: No findings suspicious for osseous metastatic disease. Incidental CT findings: None. IMPRESSION: 1. Persistent focus of hypermetabolism in the region of the left piriform sinus but no new or progressive findings or cervical adenopathy. 2. Enlarging left lower lobe lung mass with relatively stable hypermetabolism. 3. Enlarging superior segment right lower lobe pulmonary nodule with stable low level hypermetabolism. 4. No mediastinal or hilar adenopathy or new pulmonary lesions. 5. No findings for abdominal/pelvic or osseous metastatic disease. Electronically Signed   By: Marijo Sanes M.D.   On: 08/27/2021 11:07   CT SOFT TISSUE NECK W CONTRAST  Result Date: 08/03/2021 CLINICAL DATA:  Supraglottic cancer.  Non-small cell lung cancer. EXAM: CT NECK WITH CONTRAST TECHNIQUE: Multidetector CT imaging of the neck was performed using the standard protocol following the bolus administration of intravenous contrast. RADIATION DOSE REDUCTION: This exam was performed according to the departmental dose-optimization program which includes automated exposure control, adjustment of the mA and/or kV according to patient size and/or use of iterative reconstruction technique. CONTRAST:  87mL OMNIPAQUE IOHEXOL 300 MG/ML  SOLN COMPARISON:  CT neck 01/18/2021.  PET 05/11/2021 FINDINGS: Pharynx and larynx: Symmetric edema in the larynx and aryepiglottic folds bilaterally. This is most likely due to radiation change. Metabolic uptake in the left piriform sinus on PET does not correlate to a mass lesion on the current CT. Oral cavity normal. Oropharynx normal. Salivary glands: No inflammation, mass, or stone. Thyroid: Negative Lymph nodes: No enlarged lymph nodes in the neck. Vascular: Extensive atherosclerotic calcification the carotid bifurcation bilaterally. Atherosclerotic calcification distal vertebral artery bilaterally with significant  stenosis on the left. Atherosclerotic calcification aortic arch and proximal great vessels. Limited intracranial: Negative Visualized orbits: Negative Mastoids and visualized paranasal sinuses: Negative Skeleton: Cervical spondylosis.  No acute skeletal abnormality. Upper chest: Apical emphysema.  No acute abnormality. Other: None IMPRESSION: Negative for recurrent mass or adenopathy. Post radiation changes in the larynx. Advanced atherosclerotic disease. Refer to CT angio head and neck 04/30/2020. Electronically Signed   By: Franchot Gallo M.D.   On: 08/03/2021 12:19     ASSESSMENT:  1.  T3N2C supraglottic squamous cell carcinoma: -Laryngoscopy and biopsy on 01/02/2019, trach placed on 02/18/2019. -PET scan on 01/27/2019 showed hypermetabolic laryngeal lesion with hypermetabolic cervical metastatic disease bilaterally. -Weekly cisplatin and radiation started on 03/07/2019, many interruptions due to hospitalizations. -XRT completed on 05/08/2019 and last weekly cisplatin on 05/15/2019. -CT angio chest on 10/20/2019 showed 6 mm right upper lobe lung nodule increased in size from prior  PET scan.  Resolved subpleural nodules laterally in the right upper lobe.  Subcentimeter hyperenhancing liver lesions. -PET scan on 11/13/2059 showed moderate hypermetabolism noted in the prior tracheostomy tract, likely granulation tissue.  No lymphadenopathy in the neck.  6 mm right upper lobe nodule is mildly hypermetabolic with SUV 9.93.  5 mm left axillary lymph node with SUV 3.49.  6.5 mm precarinal lymph node slightly enlarged from 5 mm.  Difficult to measure bilateral hilar lymph nodes are hypermetabolic.   2.  Nutrition: -He has a PEG tube.  He is using Osmolite 1.53 to 4 cans/day.   3.  CVA: -She had pontine stroke with large vessel occlusion. -He had bleeding from trach site while on Plavix which was held since 03/04/2019.  4.  Squamous cell carcinoma of the lung: - SBRT of the right upper lobe and left lower lobe  lesions on 08/21/2020 - PET/CT on 05/13/2021: Focal hypermetabolism in the left pyriform sinus with possible soft tissue asymmetry.  No evidence of cervical nodal hypermetabolic metastasis.  Hypermetabolism in the enlarging 2.3 cm (SUV 9.1) left lower lobe pulmonary nodule.  Mild enlargement of subcarinal lymph node measuring 1 cm with SUV 4.1 (previously 9 mm with SUV 2.9).  Right lower lobe focus of hypermetabolism may correspond to minimal interstitial thickening with SUV 2.8 similar in CT morphology to 07/02/2020.  No evidence of extrathoracic metastasis. - Left lower lobe lung biopsy (06/09/2021): Poorly differentiated squamous cell carcinoma.  IHC shows tumor cells positive for p40, p63 and negative for TTF-1, Napsin A and synaptophysin and chromogranin.  P16 is negative.   PLAN:  1.  Supraglottic squamous cell carcinoma: - PET scan on 08/25/2021 showed persistent hypermetabolism in the region of the left pyriform sinus with SUV 9.66, stable from previous scan.  No evidence of new or progressive findings or cervical adenopathy.     2.  Recurrent squamous cell lung cancer: - PET scan (08/25/2021): Enlarging left lower lobe lung mass measuring 3.9 x 3.1 cm, SUV 10.7.  Enlarging superior segment right lower lobe lung nodule measuring 10 mm, SUV 2.8. - He was not felt to be a candidate for ablation procedure. - I have reviewed NGS testing results which did not show any targetable mutations.  PD-L1 is 0%. - He is not interested in any systemic therapy. - He is meeting with Dr. Isidore Moos tomorrow to talk about radiation. - I do not have anything to offer to him at this time.  Recommend follow-up with me as needed.   Orders placed this encounter:  No orders of the defined types were placed in this encounter.    [name], MD [place] [phone]

## 2021-09-02 ENCOUNTER — Ambulatory Visit
Admission: RE | Admit: 2021-09-02 | Discharge: 2021-09-02 | Disposition: A | Discharge status: Home / Self Care | Payer: Medicare Other | Source: Ambulatory Visit | Attending: Radiation Oncology | Admitting: Radiation Oncology

## 2021-09-02 ENCOUNTER — Other Ambulatory Visit: Payer: Self-pay

## 2021-09-02 VITALS — BP 132/77 | HR 67 | Temp 97.7°F | Resp 17 | Wt 162.2 lb

## 2021-09-02 DIAGNOSIS — C7802 Secondary malignant neoplasm of left lung: Secondary | ICD-10-CM | POA: Diagnosis not present

## 2021-09-02 DIAGNOSIS — I700 Atherosclerosis of aorta: Secondary | ICD-10-CM | POA: Insufficient documentation

## 2021-09-02 DIAGNOSIS — Z79890 Hormone replacement therapy (postmenopausal): Secondary | ICD-10-CM | POA: Insufficient documentation

## 2021-09-02 DIAGNOSIS — C3411 Malignant neoplasm of upper lobe, right bronchus or lung: Secondary | ICD-10-CM | POA: Insufficient documentation

## 2021-09-02 DIAGNOSIS — Z7982 Long term (current) use of aspirin: Secondary | ICD-10-CM | POA: Diagnosis not present

## 2021-09-02 DIAGNOSIS — I7143 Infrarenal abdominal aortic aneurysm, without rupture: Secondary | ICD-10-CM | POA: Insufficient documentation

## 2021-09-02 DIAGNOSIS — Z510 Encounter for antineoplastic radiation therapy: Secondary | ICD-10-CM | POA: Diagnosis not present

## 2021-09-02 DIAGNOSIS — C321 Malignant neoplasm of supraglottis: Secondary | ICD-10-CM | POA: Diagnosis not present

## 2021-09-02 DIAGNOSIS — Z8521 Personal history of malignant neoplasm of larynx: Secondary | ICD-10-CM | POA: Insufficient documentation

## 2021-09-02 DIAGNOSIS — Z79899 Other long term (current) drug therapy: Secondary | ICD-10-CM | POA: Diagnosis not present

## 2021-09-02 NOTE — Progress Notes (Addendum)
Radiation Oncology         [phone] ________________________________  Name: [name] MRN: [id number]  Date: 09/02/2021  DOB: December 26, 1960  Follow-Up Visit Note  Outpatient  CC: [name], MD  [name], MD  Diagnosis and Prior Radiotherapy:    ICD-10-CM   1. 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 Malignant neoplasm of upper lobe of right lung ([facility])  C34.11     3. 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)  Radiation Treatment Dates: 08/10/2020 through 08/20/2020 Site Technique Total Dose (Gy) Dose per Fx (Gy) Completed Fx Beam Energies  Lung, Right: Lung_Rt_upper IMRT 50/50 10 5/5 6XFFF  Lung, Left: Lung_Lt_lower IMRT 50/50 10 5/5 6XFFF   Radiation Treatment Dates: 03/05/2019 through 05/08/2019 Site Technique Total Dose (Gy) Dose per Fx (Gy) Completed Fx Beam Energies  Larynx: HN_larynx IMRT 70/70 2 35/35 6X   CHIEF COMPLAINT: Here for follow-up and surveillance of lung and throat cancer  Narrative:     [name] presents today for follow-up after completing SBRT treatment to his right upper lung and left lower lung on 08/20/2020, and to review  PET scan results from 08/25/2021; (also completed radiation to his larynx on 05/08/2019)  Pain issues, if any: Patient denies Using a feeding tube?: N/A Weight changes, if any:  Wt Readings from Last 3 Encounters:  09/02/21 162 lb 4 oz (73.6 kg)  09/01/21 154 lb 8 oz (70.1 kg)  06/29/21 158 lb 11.2 oz (72 kg)   Swallowing issues, if any: Denies--reports he can eat/drink a fairly wide variety  Respiratory concerns: He denies any new issues or concerns. He was recently started on an  albuterol inhaler, but reports he hardly has to use it. Smoking or chewing tobacco? None Using fluoride trays daily?  N/A--has full set of dentures Last ENT visit was on: Saw Dr. [name] on 08/30/2021  Other notable issues, if any: Saw his medical oncologist Dr. Delton Coombes yesterday 09/01/21. Overall reports he's doing well and denies any new issues/concerns.     ALLERGIES:  is allergic to cymbalta [duloxetine hcl], duloxetine, and prednisone.  Meds: Current Outpatient Medications  Medication Sig Dispense Refill   acetaminophen (TYLENOL) 325 MG tablet Take 1.5 tablets (487.5 mg total) by mouth every 6 (six) hours as needed for mild pain, fever or headache (or Fever >/= 101).     albuterol (VENTOLIN HFA) 108 (90 Base) MCG/ACT inhaler Inhale 1-2 puffs into the lungs every 4 (four) hours as needed for wheezing or shortness of breath.     amLODipine (NORVASC) 5 MG tablet Take 1 tablet (5 mg total) by mouth daily. 30 tablet 0   aspirin EC 81 MG tablet Take 1 tablet (81 mg total) by mouth daily with breakfast. 30 tablet 4   atorvastatin (LIPITOR) 40 MG tablet Take 80 mg by mouth at bedtime.     Baclofen 5 MG TABS Take 10 mg by mouth at bedtime. (Patient taking differently: Take 5 mg by mouth at bedtime.) 60 tablet 3   diazepam (VALIUM) 10 MG tablet Take 5-10 mg by mouth in the morning and at bedtime. 5mg  in the morning, and 10mg  at  night, 1 extra tab if needed     docusate sodium (COLACE) 100 MG capsule Take 100 mg by mouth 2 (two) times daily.     dronabinol (MARINOL) 5 MG capsule TAKE 1 CAPSULE(5 MG) BY MOUTH TWICE DAILY BEFORE A MEAL 60 capsule 3   DULoxetine (CYMBALTA) 30 MG capsule Take 30 mg by mouth daily.     HYDROcodone-acetaminophen (NORCO) 10-325 MG tablet Take 1 tablet by mouth every 4 (four) hours as needed. (Patient taking differently: Take 1 tablet by mouth every 4 (four) hours as needed for moderate pain.) 50 tablet 0   lactulose (CHRONULAC) 10 GM/15ML solution TAKE 30 MLS( 20  GIVE) BY MOUTH FOUR TIMES DAILY AS NEEDED (Patient taking differently: Take 20 g by mouth 4 (four) times daily as needed for mild constipation.) 473 mL 2   levothyroxine (SYNTHROID) 50 MCG tablet Take 50 mcg by mouth daily.     metoprolol tartrate (LOPRESSOR) 25 MG tablet Take 12.5 mg by mouth 2 (two) times daily.     nitroGLYCERIN (NITROSTAT) 0.4 MG SL tablet Place 1 tablet (0.4 mg total) under the tongue every 5 (five) minutes as needed for chest pain (CP or SOB). (Patient not taking: Reported on 09/01/2021) 25 tablet 3   omeprazole (PRILOSEC) 40 MG capsule TAKE 1 CAPSULE(40 MG) BY MOUTH DAILY (Patient taking differently: Take 40 mg by mouth daily.) 90 capsule 0   potassium chloride SA (KLOR-CON) 20 MEQ tablet Take 20 mEq by mouth daily.     valproic acid (DEPAKENE) 250 MG/5ML solution TAKE 10 ML BY MOUTH THREE TIMES DAILY (Patient taking differently: Take 500 mg by mouth 3 (three) times daily.) 900 mL 5   No current facility-administered medications for this encounter.    Physical Findings: The patient is in no acute distress. Patient is alert and oriented.  weight is 162 lb 4 oz (73.6 kg). His temporal temperature is 97.7 F (36.5 C). His blood pressure is 132/77 and his pulse is 67. His respiration is 17 and oxygen saturation is 93%. .    Gen: alert, oriented, NAD Heart is regular in rate and rhythm Chest CTAB Psych: judgment and insight appear intact Neuro: alert, conversant, speech fluent.  Lab Findings: Lab Results  Component Value Date   WBC 8.4 08/03/2021   HGB 14.6 08/03/2021   HCT 42.3 08/03/2021   MCV 88.5 08/03/2021   PLT 198 08/03/2021   Lab Results  Component Value Date   TSH 1.781 08/03/2021    Radiographic Findings: NM PET Image Restag (PS) Skull Base To Thigh  Result Date: 08/27/2021 CLINICAL DATA:  Subsequent treatment strategy for history of head neck cancer with new diagnosis of left lung cancer. Recent lung biopsy. EXAM: NUCLEAR MEDICINE PET SKULL BASE TO  THIGH TECHNIQUE: 8.51 mCi F-18 FDG was injected intravenously. Full-ring PET imaging was performed from the skull base to thigh after the radiotracer. CT data was obtained and used for attenuation correction and anatomic localization. Fasting blood glucose: 93 mg/dl COMPARISON:  PET-CT 05/11/2021 FINDINGS: Mediastinal blood pool activity: SUV max 2.26 Liver activity: SUV max NA NECK: Persistent hypermetabolism the region of the left piriform sinus with SUV max of 9.66. This was previously 9.29. No enlarged or hypermetabolic neck nodes. Moderate but symmetric uptake in the tongue and other head and neck muscles. Incidental CT findings: None. CHEST: Interval enlargement of the left lower lobe pulmonary mass. Measures 3.9 x 3.1 cm and previously measured 2.5 x 1.8 cm. SUV max is 10.7 and was previously  6.57. No hypermetabolic mediastinal or hilar lymph nodes are identified. Superior segment right lower lobe pulmonary nodule measures 10 mm image 37/9. This previously measured 8 mm. It also appears more solid. SUV max is 2.80 and was previously 2.77. No new pulmonary lesions. Incidental CT findings: Stable atherosclerotic calcifications involving the aorta and coronary arteries. Stable emphysematous changes and pulmonary scarring. ABDOMEN/PELVIS: No abnormal hypermetabolic activity within the liver, pancreas, adrenal glands, or spleen. No hypermetabolic lymph nodes in the abdomen or pelvis. Incidental CT findings: Stable vascular calcifications and stable 3 cm infrarenal abdominal aortic aneurysm. Recommend follow-up ultrasound every 3 years. This recommendation follows ACR consensus guidelines: White Paper of the ACR Incidental Findings Committee II on Vascular Findings. J Am Coll Radiol 2013; 10:789-794. Moderately thick-walled bladder possibly due to bladder outlet obstruction. This is a stable finding. SKELETON: No findings suspicious for osseous metastatic disease. Incidental CT findings: None. IMPRESSION: 1.  Persistent focus of hypermetabolism in the region of the left piriform sinus but no new or progressive findings or cervical adenopathy. 2. Enlarging left lower lobe lung mass with relatively stable hypermetabolism. 3. Enlarging superior segment right lower lobe pulmonary nodule with stable low level hypermetabolism. 4. No mediastinal or hilar adenopathy or new pulmonary lesions. 5. No findings for abdominal/pelvic or osseous metastatic disease. Electronically Signed   By: Marijo Sanes M.D.   On: 08/27/2021 11:07    Impression/Plan: I personally shared the patient's images with him and his wife.   Recent PET shows growth in the recurrent LLL tumor and no other obvious active disease. The Right upper central lung mass has been well controlled. There is a  RLL mass that is about 1cm and with low SUV uptake. This will need monitoring. Recent ENT laryngoscopy showed NED.  His LLL mass is unfortunately too large for ablative treatment by IR at [facility].   He does not want systemic therapy or aggressive surgery given his comorbidities and QOL concerns.   We discussed reirradiation w/ SBRT.  I recommend an aggressive ablative approach over 3 fractions which would be more aggressive this his previous course of SBRT last year.  This carries a significant risk of rib fraction or nerve injury given his previous treatment.  But, less aggressive fractionation regimens would be less likely to success in controlling this recurrent, radioresistant mass.  After a long discussion, including other fractionation schemes that would be less likely to cause side effects but also less likely to procure local control, he and his wife would like to purse 3 fraction salvage SBRT tp the LLL tumor and observe the untreated nonspecific RLL nodule.  Consent signed today,  after our discussion acknowledging the risks above as well as potential serious injury to normal thoracic tissues. CT planning today, treatment to start in 2  weeks.  Thyroid function: This is being monitored by medical oncology  - labs WNL Lab Results  Component Value Date   TSH 1.781 08/03/2021    On date of service, in total, I spent 40 minutes on this encounter. Patient was seen in person.  _____________________________________   Eppie Gibson, MD

## 2021-09-07 ENCOUNTER — Emergency Department (HOSPITAL_COMMUNITY): Payer: Medicare Other

## 2021-09-07 ENCOUNTER — Telehealth: Payer: Self-pay

## 2021-09-07 ENCOUNTER — Encounter (HOSPITAL_COMMUNITY): Payer: Self-pay

## 2021-09-07 ENCOUNTER — Other Ambulatory Visit: Payer: Self-pay

## 2021-09-07 ENCOUNTER — Inpatient Hospital Stay (HOSPITAL_COMMUNITY)
Admission: EM | Admit: 2021-09-07 | Discharge: 2021-09-13 | DRG: 481 | Disposition: A | Discharge status: Skilled Nursing Facility | Payer: Medicare Other | Attending: Internal Medicine | Admitting: Internal Medicine

## 2021-09-07 DIAGNOSIS — M898X9 Other specified disorders of bone, unspecified site: Secondary | ICD-10-CM | POA: Diagnosis present

## 2021-09-07 DIAGNOSIS — Z803 Family history of malignant neoplasm of breast: Secondary | ICD-10-CM

## 2021-09-07 DIAGNOSIS — S72145A Nondisplaced intertrochanteric fracture of left femur, initial encounter for closed fracture: Principal | ICD-10-CM | POA: Diagnosis present

## 2021-09-07 DIAGNOSIS — Z833 Family history of diabetes mellitus: Secondary | ICD-10-CM | POA: Diagnosis not present

## 2021-09-07 DIAGNOSIS — F419 Anxiety disorder, unspecified: Secondary | ICD-10-CM | POA: Diagnosis present

## 2021-09-07 DIAGNOSIS — E871 Hypo-osmolality and hyponatremia: Secondary | ICD-10-CM | POA: Diagnosis not present

## 2021-09-07 DIAGNOSIS — Z7982 Long term (current) use of aspirin: Secondary | ICD-10-CM

## 2021-09-07 DIAGNOSIS — F32A Depression, unspecified: Secondary | ICD-10-CM | POA: Diagnosis present

## 2021-09-07 DIAGNOSIS — R2689 Other abnormalities of gait and mobility: Secondary | ICD-10-CM | POA: Diagnosis not present

## 2021-09-07 DIAGNOSIS — S72009A Fracture of unspecified part of neck of unspecified femur, initial encounter for closed fracture: Secondary | ICD-10-CM | POA: Diagnosis present

## 2021-09-07 DIAGNOSIS — E039 Hypothyroidism, unspecified: Secondary | ICD-10-CM | POA: Diagnosis present

## 2021-09-07 DIAGNOSIS — I10 Essential (primary) hypertension: Secondary | ICD-10-CM | POA: Diagnosis not present

## 2021-09-07 DIAGNOSIS — J449 Chronic obstructive pulmonary disease, unspecified: Secondary | ICD-10-CM | POA: Diagnosis present

## 2021-09-07 DIAGNOSIS — Y92009 Unspecified place in unspecified non-institutional (private) residence as the place of occurrence of the external cause: Secondary | ICD-10-CM | POA: Diagnosis not present

## 2021-09-07 DIAGNOSIS — C3411 Malignant neoplasm of upper lobe, right bronchus or lung: Secondary | ICD-10-CM | POA: Diagnosis not present

## 2021-09-07 DIAGNOSIS — Z87891 Personal history of nicotine dependence: Secondary | ICD-10-CM

## 2021-09-07 DIAGNOSIS — D638 Anemia in other chronic diseases classified elsewhere: Secondary | ICD-10-CM | POA: Diagnosis not present

## 2021-09-07 DIAGNOSIS — Z9889 Other specified postprocedural states: Secondary | ICD-10-CM | POA: Diagnosis not present

## 2021-09-07 DIAGNOSIS — I499 Cardiac arrhythmia, unspecified: Secondary | ICD-10-CM | POA: Diagnosis not present

## 2021-09-07 DIAGNOSIS — E440 Moderate protein-calorie malnutrition: Secondary | ICD-10-CM | POA: Diagnosis not present

## 2021-09-07 DIAGNOSIS — Z888 Allergy status to other drugs, medicaments and biological substances status: Secondary | ICD-10-CM | POA: Diagnosis not present

## 2021-09-07 DIAGNOSIS — G40909 Epilepsy, unspecified, not intractable, without status epilepticus: Secondary | ICD-10-CM | POA: Diagnosis present

## 2021-09-07 DIAGNOSIS — I2510 Atherosclerotic heart disease of native coronary artery without angina pectoris: Secondary | ICD-10-CM | POA: Diagnosis present

## 2021-09-07 DIAGNOSIS — D62 Acute posthemorrhagic anemia: Secondary | ICD-10-CM | POA: Diagnosis not present

## 2021-09-07 DIAGNOSIS — S72002A Fracture of unspecified part of neck of left femur, initial encounter for closed fracture: Secondary | ICD-10-CM

## 2021-09-07 DIAGNOSIS — Z800 Family history of malignant neoplasm of digestive organs: Secondary | ICD-10-CM | POA: Diagnosis not present

## 2021-09-07 DIAGNOSIS — Z7401 Bed confinement status: Secondary | ICD-10-CM | POA: Diagnosis not present

## 2021-09-07 DIAGNOSIS — I639 Cerebral infarction, unspecified: Secondary | ICD-10-CM | POA: Diagnosis present

## 2021-09-07 DIAGNOSIS — S72122A Displaced fracture of lesser trochanter of left femur, initial encounter for closed fracture: Secondary | ICD-10-CM | POA: Diagnosis not present

## 2021-09-07 DIAGNOSIS — M6281 Muscle weakness (generalized): Secondary | ICD-10-CM | POA: Diagnosis not present

## 2021-09-07 DIAGNOSIS — W19XXXA Unspecified fall, initial encounter: Secondary | ICD-10-CM | POA: Diagnosis present

## 2021-09-07 DIAGNOSIS — Z79899 Other long term (current) drug therapy: Secondary | ICD-10-CM

## 2021-09-07 DIAGNOSIS — S72142A Displaced intertrochanteric fracture of left femur, initial encounter for closed fracture: Secondary | ICD-10-CM | POA: Diagnosis not present

## 2021-09-07 DIAGNOSIS — Z8673 Personal history of transient ischemic attack (TIA), and cerebral infarction without residual deficits: Secondary | ICD-10-CM

## 2021-09-07 DIAGNOSIS — R0902 Hypoxemia: Secondary | ICD-10-CM | POA: Diagnosis not present

## 2021-09-07 DIAGNOSIS — E785 Hyperlipidemia, unspecified: Secondary | ICD-10-CM | POA: Diagnosis not present

## 2021-09-07 DIAGNOSIS — S7292XD Unspecified fracture of left femur, subsequent encounter for closed fracture with routine healing: Secondary | ICD-10-CM | POA: Diagnosis not present

## 2021-09-07 DIAGNOSIS — Z955 Presence of coronary angioplasty implant and graft: Secondary | ICD-10-CM | POA: Diagnosis not present

## 2021-09-07 DIAGNOSIS — C760 Malignant neoplasm of head, face and neck: Secondary | ICD-10-CM | POA: Diagnosis present

## 2021-09-07 DIAGNOSIS — K219 Gastro-esophageal reflux disease without esophagitis: Secondary | ICD-10-CM | POA: Diagnosis not present

## 2021-09-07 DIAGNOSIS — E7800 Pure hypercholesterolemia, unspecified: Secondary | ICD-10-CM | POA: Diagnosis not present

## 2021-09-07 DIAGNOSIS — C3490 Malignant neoplasm of unspecified part of unspecified bronchus or lung: Secondary | ICD-10-CM | POA: Diagnosis not present

## 2021-09-07 DIAGNOSIS — M25552 Pain in left hip: Secondary | ICD-10-CM | POA: Diagnosis not present

## 2021-09-07 DIAGNOSIS — R1312 Dysphagia, oropharyngeal phase: Secondary | ICD-10-CM | POA: Diagnosis not present

## 2021-09-07 DIAGNOSIS — C7802 Secondary malignant neoplasm of left lung: Secondary | ICD-10-CM | POA: Diagnosis not present

## 2021-09-07 DIAGNOSIS — C321 Malignant neoplasm of supraglottis: Secondary | ICD-10-CM | POA: Diagnosis not present

## 2021-09-07 DIAGNOSIS — M2550 Pain in unspecified joint: Secondary | ICD-10-CM | POA: Diagnosis not present

## 2021-09-07 DIAGNOSIS — Z8249 Family history of ischemic heart disease and other diseases of the circulatory system: Secondary | ICD-10-CM

## 2021-09-07 DIAGNOSIS — Z79890 Hormone replacement therapy (postmenopausal): Secondary | ICD-10-CM

## 2021-09-07 DIAGNOSIS — R279 Unspecified lack of coordination: Secondary | ICD-10-CM | POA: Diagnosis not present

## 2021-09-07 DIAGNOSIS — R000 Tachycardia, unspecified: Secondary | ICD-10-CM | POA: Diagnosis not present

## 2021-09-07 DIAGNOSIS — Z743 Need for continuous supervision: Secondary | ICD-10-CM | POA: Diagnosis not present

## 2021-09-07 DIAGNOSIS — Z043 Encounter for examination and observation following other accident: Secondary | ICD-10-CM | POA: Diagnosis not present

## 2021-09-07 HISTORY — DX: Malignant neoplasm of unspecified part of unspecified bronchus or lung: C34.90

## 2021-09-07 LAB — HIV ANTIBODY (ROUTINE TESTING W REFLEX): HIV Screen 4th Generation wRfx: NONREACTIVE

## 2021-09-07 LAB — CBC
HCT: 38.3 % — ABNORMAL LOW (ref 39.0–52.0)
Hemoglobin: 12.7 g/dL — ABNORMAL LOW (ref 13.0–17.0)
MCH: 30.2 pg (ref 26.0–34.0)
MCHC: 33.2 g/dL (ref 30.0–36.0)
MCV: 91.2 fL (ref 80.0–100.0)
Platelets: 164 10*3/uL (ref 150–400)
RBC: 4.2 MIL/uL — ABNORMAL LOW (ref 4.22–5.81)
RDW: 13.7 % (ref 11.5–15.5)
WBC: 10.7 10*3/uL — ABNORMAL HIGH (ref 4.0–10.5)
nRBC: 0 % (ref 0.0–0.2)

## 2021-09-07 LAB — BASIC METABOLIC PANEL
Anion gap: 8 (ref 5–15)
BUN: 11 mg/dL (ref 6–20)
CO2: 29 mmol/L (ref 22–32)
Calcium: 8.6 mg/dL — ABNORMAL LOW (ref 8.9–10.3)
Chloride: 92 mmol/L — ABNORMAL LOW (ref 98–111)
Creatinine, Ser: 0.88 mg/dL (ref 0.61–1.24)
GFR, Estimated: >60 mL/min (ref >60)
Glucose, Bld: 127 mg/dL — ABNORMAL HIGH (ref 70–99)
Potassium: 4 mmol/L (ref 3.5–5.1)
Sodium: 129 mmol/L — ABNORMAL LOW (ref 135–145)

## 2021-09-07 LAB — TSH: TSH: 2.121 u[IU]/mL (ref 0.350–4.500)

## 2021-09-07 LAB — T4, FREE: Free T4: 0.96 ng/dL (ref 0.61–1.12)

## 2021-09-07 MED ORDER — LEVOTHYROXINE SODIUM 50 MCG PO TABS
50.0000 ug | ORAL_TABLET | Freq: Every day | ORAL | Status: DC
Start: 2021-09-08 — End: 2021-09-14
  Administered 2021-09-09 – 2021-09-13 (×5): 50 ug via ORAL
  Filled 2021-09-07 (×5): qty 1

## 2021-09-07 MED ORDER — MORPHINE SULFATE (PF) 4 MG/ML IV SOLN
2.0000 mg | Freq: Once | INTRAVENOUS | Status: AC
  Administered 2021-09-07: 2 mg via INTRAVENOUS
  Filled 2021-09-07: qty 1

## 2021-09-07 MED ORDER — HYDROMORPHONE HCL 1 MG/ML IJ SOLN
0.5000 mg | Freq: Once | INTRAMUSCULAR | Status: AC
  Administered 2021-09-07: 0.5 mg via INTRAVENOUS
  Filled 2021-09-07: qty 0.5

## 2021-09-07 MED ORDER — DOCUSATE SODIUM 100 MG PO CAPS
100.0000 mg | ORAL_CAPSULE | Freq: Two times a day (BID) | ORAL | Status: DC
  Administered 2021-09-07: 100 mg via ORAL
  Filled 2021-09-07: qty 1

## 2021-09-07 MED ORDER — ACETAMINOPHEN 650 MG RE SUPP: 650.0000 mg | Freq: Four times a day (QID) | RECTAL | Status: DC | PRN

## 2021-09-07 MED ORDER — ALBUTEROL SULFATE HFA 108 (90 BASE) MCG/ACT IN AERS: 1.0000 | INHALATION_SPRAY | RESPIRATORY_TRACT | Status: DC | PRN

## 2021-09-07 MED ORDER — ALBUTEROL SULFATE (2.5 MG/3ML) 0.083% IN NEBU: 2.5000 mg | INHALATION_SOLUTION | RESPIRATORY_TRACT | Status: DC | PRN

## 2021-09-07 MED ORDER — ASPIRIN 81 MG PO TBEC
81.0000 mg | DELAYED_RELEASE_TABLET | Freq: Every day | ORAL | Status: DC
  Administered 2021-09-09: 81 mg via ORAL
  Filled 2021-09-07: qty 1

## 2021-09-07 MED ORDER — CEFAZOLIN SODIUM-DEXTROSE 2-4 GM/100ML-% IV SOLN
2.0000 g | INTRAVENOUS | Status: AC
  Administered 2021-09-08: 2 g via INTRAVENOUS
  Filled 2021-09-07: qty 100

## 2021-09-07 MED ORDER — VALPROIC ACID 250 MG/5ML PO SOLN
500.0000 mg | Freq: Three times a day (TID) | ORAL | Status: DC
  Filled 2021-09-07 (×6): qty 10

## 2021-09-07 MED ORDER — DIAZEPAM 5 MG PO TABS
5.0000 mg | ORAL_TABLET | Freq: Every day | ORAL | Status: DC
  Administered 2021-09-08 – 2021-09-13 (×6): 5 mg via ORAL
  Filled 2021-09-07 (×6): qty 1

## 2021-09-07 MED ORDER — DULOXETINE HCL 30 MG PO CPEP
30.0000 mg | ORAL_CAPSULE | Freq: Every day | ORAL | Status: DC
  Administered 2021-09-08 – 2021-09-13 (×6): 30 mg via ORAL
  Filled 2021-09-07 (×6): qty 1

## 2021-09-07 MED ORDER — SODIUM CHLORIDE 0.9 % IV BOLUS
1000.0000 mL | Freq: Once | INTRAVENOUS | Status: AC
  Administered 2021-09-07: 1000 mL via INTRAVENOUS

## 2021-09-07 MED ORDER — AMLODIPINE BESYLATE 5 MG PO TABS
5.0000 mg | ORAL_TABLET | Freq: Every day | ORAL | Status: DC
  Administered 2021-09-08 – 2021-09-11 (×4): 5 mg via ORAL
  Filled 2021-09-07 (×4): qty 1

## 2021-09-07 MED ORDER — CHLORHEXIDINE GLUCONATE 4 % EX LIQD
60.0000 mL | Freq: Once | CUTANEOUS | Status: AC
  Administered 2021-09-08: 4 via TOPICAL
  Filled 2021-09-07: qty 60

## 2021-09-07 MED ORDER — DIAZEPAM 5 MG PO TABS
10.0000 mg | ORAL_TABLET | Freq: Every day | ORAL | Status: DC
  Administered 2021-09-08 – 2021-09-12 (×5): 10 mg via ORAL
  Filled 2021-09-07 (×5): qty 2

## 2021-09-07 MED ORDER — BACLOFEN 10 MG PO TABS
5.0000 mg | ORAL_TABLET | Freq: Every day | ORAL | Status: DC
Start: 2021-09-07 — End: 2021-09-09
  Administered 2021-09-07 – 2021-09-09 (×3): 5 mg via ORAL
  Filled 2021-09-07 (×3): qty 1

## 2021-09-07 MED ORDER — METOPROLOL TARTRATE 12.5 MG HALF TABLET
12.5000 mg | ORAL_TABLET | Freq: Two times a day (BID) | ORAL | Status: DC
  Administered 2021-09-07 – 2021-09-09 (×5): 12.5 mg via ORAL
  Filled 2021-09-07 (×5): qty 1

## 2021-09-07 MED ORDER — VALPROIC ACID 250 MG/5ML PO SOLN
500.0000 mg | Freq: Three times a day (TID) | ORAL | Status: DC
  Administered 2021-09-07 – 2021-09-13 (×18): 500 mg via ORAL
  Filled 2021-09-07 (×24): qty 10

## 2021-09-07 MED ORDER — DIAZEPAM 5 MG PO TABS: 5.0000 mg | ORAL_TABLET | Freq: Two times a day (BID) | ORAL | Status: DC | PRN

## 2021-09-07 MED ORDER — SODIUM CHLORIDE 0.9 % IV SOLN: INTRAVENOUS | Status: AC

## 2021-09-07 MED ORDER — HYDROMORPHONE HCL 1 MG/ML IJ SOLN
0.5000 mg | INTRAMUSCULAR | Status: DC | PRN
  Administered 2021-09-07 – 2021-09-09 (×9): 1 mg via INTRAVENOUS
  Administered 2021-09-09: 0.5 mg via INTRAVENOUS
  Administered 2021-09-09 – 2021-09-13 (×15): 1 mg via INTRAVENOUS
  Filled 2021-09-07 (×27): qty 1

## 2021-09-07 MED ORDER — NITROGLYCERIN 0.4 MG SL SUBL: 0.4000 mg | SUBLINGUAL_TABLET | SUBLINGUAL | Status: DC | PRN

## 2021-09-07 MED ORDER — PANTOPRAZOLE SODIUM 40 MG PO TBEC
40.0000 mg | DELAYED_RELEASE_TABLET | Freq: Every day | ORAL | Status: DC
  Administered 2021-09-08 – 2021-09-09 (×2): 40 mg via ORAL
  Filled 2021-09-07 (×2): qty 1

## 2021-09-07 MED ORDER — ENOXAPARIN SODIUM 40 MG/0.4ML IJ SOSY: 40.0000 mg | PREFILLED_SYRINGE | INTRAMUSCULAR | Status: DC

## 2021-09-07 MED ORDER — ATORVASTATIN CALCIUM 40 MG PO TABS
40.0000 mg | ORAL_TABLET | Freq: Every day | ORAL | Status: DC
  Administered 2021-09-07 – 2021-09-12 (×6): 40 mg via ORAL
  Filled 2021-09-07 (×6): qty 1

## 2021-09-07 MED ORDER — ACETAMINOPHEN 500 MG PO TABS
1000.0000 mg | ORAL_TABLET | Freq: Once | ORAL | Status: AC
  Administered 2021-09-07: 1000 mg via ORAL
  Filled 2021-09-07: qty 2

## 2021-09-07 MED ORDER — ACETAMINOPHEN 325 MG PO TABS: 650.0000 mg | ORAL_TABLET | Freq: Four times a day (QID) | ORAL | Status: DC | PRN

## 2021-09-07 MED ORDER — KETOROLAC TROMETHAMINE 15 MG/ML IJ SOLN
15.0000 mg | Freq: Once | INTRAMUSCULAR | Status: AC
  Administered 2021-09-07: 15 mg via INTRAVENOUS
  Filled 2021-09-07: qty 1

## 2021-09-07 NOTE — ED Triage Notes (Signed)
Pt getting out of truck and fell. Left hip pain and no head injury.

## 2021-09-07 NOTE — ED Provider Notes (Signed)
[place] Provider Note   CSN: [id number] Arrival date & time: 09/07/21  1030     History  Chief Complaint  Patient presents with   [name]    [name] is a 61 y.o. male with past medical history significant for hypertension, hyperlipidemia, previous stroke, who has known laryngeal and lung cancer for which he is waiting to undergo treatment who presents with concern for acute onset 10 out of 10 left hip pain, left knee pain after fall earlier today.  Patient reports mechanical, nonsyncopal fall where he twisted around his cane getting out of his truck.  He denies any head injury, loss of conscious.  He did not take anything for pain prior to arrival.  He reports inability to ambulate secondary to pain, has not tried to put very much weight on the left hip since injury.  He denies numbness, tingling, back pain, neck pain, saddle anesthesia, weakness of extremities   Fall       Home Medications Prior to Admission medications   Medication Sig Start Date End Date Taking? Authorizing Provider  acetaminophen (TYLENOL) 325 MG tablet Take 1.5 tablets (487.5 mg total) by mouth every 6 (six) hours as needed for mild pain, fever or headache (or Fever >/= 101). 05/02/20   Johnson, Clanford L, MD  albuterol (VENTOLIN HFA) 108 (90 Base) MCG/ACT inhaler Inhale 1-2 puffs into the lungs every 4 (four) hours as needed for wheezing or shortness of breath. 06/14/21   [provider]  amLODipine (NORVASC) 5 MG tablet Take 1 tablet (5 mg total) by mouth daily. 06/27/21 07/27/21  Dorie Rank, MD  aspirin EC 81 MG tablet Take 1 tablet (81 mg total) by mouth daily with breakfast. 04/15/20   Roxan Hockey, MD  atorvastatin (LIPITOR) 40 MG tablet Take 80 mg by mouth at bedtime. 01/17/20   [provider]  Baclofen 5 MG TABS Take 10 mg by mouth at bedtime. Patient taking differently: Take 5 mg by mouth at bedtime. 04/11/21   Frann Rider, NP  diazepam (VALIUM) 10 MG  tablet Take 5-10 mg by mouth in the morning and at bedtime. 5mg  in the morning, and 10mg  at night, 1 extra tab if needed 05/25/20   [provider]  docusate sodium (COLACE) 100 MG capsule Take 100 mg by mouth 2 (two) times daily.    [provider]  dronabinol (MARINOL) 5 MG capsule TAKE 1 CAPSULE(5 MG) BY MOUTH TWICE DAILY BEFORE A MEAL 07/25/21   [name], MD  DULoxetine (CYMBALTA) 30 MG capsule Take 30 mg by mouth daily. 07/25/21   [provider]  HYDROcodone-acetaminophen (NORCO) 10-325 MG tablet Take 1 tablet by mouth every 4 (four) hours as needed. Patient taking differently: Take 1 tablet by mouth every 4 (four) hours as needed for moderate pain. 06/02/15   [name], MD  lactulose (CHRONULAC) 10 GM/15ML solution TAKE 30 MLS( 20 GIVE) BY MOUTH FOUR TIMES DAILY AS NEEDED Patient taking differently: Take 20 g by mouth 4 (four) times daily as needed for mild constipation. 05/13/21   Harriett Rush, PA-C  levothyroxine (SYNTHROID) 50 MCG tablet Take 50 mcg by mouth daily. 07/25/21   [provider]  metoprolol tartrate (LOPRESSOR) 25 MG tablet Take 12.5 mg by mouth 2 (two) times daily.    [provider]  nitroGLYCERIN (NITROSTAT) 0.4 MG SL tablet Place 1 tablet (0.4 mg total) under the tongue every 5 (five) minutes as needed for chest pain (CP or SOB).  Patient not taking: Reported on 09/01/2021 07/27/14   Herminio Commons, MD  omeprazole (PRILOSEC) 40 MG capsule TAKE 1 CAPSULE(40 MG) BY MOUTH DAILY Patient taking differently: Take 40 mg by mouth daily. 03/21/21   Willia Craze, NP  potassium chloride SA (KLOR-CON) 20 MEQ tablet Take 20 mEq by mouth daily. 04/22/20   [provider]  valproic acid (DEPAKENE) 250 MG/5ML solution TAKE 10 ML BY MOUTH THREE TIMES DAILY Patient taking differently: Take 500 mg by mouth 3 (three) times daily. 04/18/21   Frann Rider, NP  prochlorperazine (COMPAZINE) 10 MG tablet TAKE 1  TABLET(10 MG) BY MOUTH EVERY 6 HOURS AS NEEDED FOR NAUSEA 08/19/19 06/11/21  Lockamy, Randi L, NP-C      Allergies    Cymbalta [duloxetine hcl], Duloxetine, and Prednisone    Review of Systems   Review of Systems  All other systems reviewed and are negative.   Physical Exam Updated Vital Signs BP (!) 143/86   Pulse (!) 59   Temp (!) 97.5 F (36.4 C) (Oral)   Resp 13   Ht 6\' 1"  (1.854 m)   Wt 74.4 kg   SpO2 98%   BMI 21.64 kg/m  Physical Exam Vitals and nursing note reviewed.  Constitutional:      General: He is not in acute distress.    Appearance: Normal appearance.  HENT:     Head: Normocephalic and atraumatic.  Eyes:     General:        Right eye: No discharge.        Left eye: No discharge.  Cardiovascular:     Rate and Rhythm: Normal rate and regular rhythm.  Pulmonary:     Effort: Pulmonary effort is normal. No respiratory distress.  Musculoskeletal:        General: No deformity.     Comments: Tenderness palpation without step-off or deformity noted left hip, left knee.  He has intact range of motion to flexion, extension at the left knee, decreased range of motion secondary to pain left hip.  Skin:    General: Skin is warm and dry.  Neurological:     Mental Status: He is alert and oriented to person, place, and time.  Psychiatric:        Mood and Affect: Mood normal.        Behavior: Behavior normal.     ED Results / Procedures / Treatments   Labs (all labs ordered are listed, but only abnormal results are displayed) Labs Reviewed  CBC - Abnormal; Notable for the following components:      Result Value   WBC 10.7 (*)    RBC 4.20 (*)    Hemoglobin 12.7 (*)    HCT 38.3 (*)    All other components within normal limits  BASIC METABOLIC PANEL - Abnormal; Notable for the following components:   Sodium 129 (*)    Chloride 92 (*)    Glucose, Bld 127 (*)    Calcium 8.6 (*)    All other components within normal limits    EKG None  Radiology DG Knee  Complete 4 Views Left  Result Date: 09/07/2021 CLINICAL DATA:  Fall EXAM: LEFT KNEE - COMPLETE 4+ VIEW COMPARISON:  None Available. FINDINGS: There is no acute fracture or dislocation. Alignment is normal. The joint spaces are preserved. There is no erosive change. There is no effusion. Dense vascular calcifications are noted. IMPRESSION: No acute fracture or dislocation. Electronically Signed   By: Court Joy.D.  On: 09/07/2021 11:42   DG Hip Unilat W or Wo Pelvis 2-3 Views Left  Result Date: 09/07/2021 CLINICAL DATA:  Fall.  Hip pain. EXAM: DG HIP (WITH OR WITHOUT PELVIS) 2-3V LEFT COMPARISON:  None Available. FINDINGS: Nondisplaced, intertrochanteric fracture of the left proximal femur, with no significant comminution, apex anterior angulation but no significant valgus or varus angulation. No other fractures.  No bone lesions. Hip joints, SI joints and symphysis pubis are normally aligned. Dense atherosclerotic vascular calcifications. IMPRESSION: 1. Nondisplaced left proximal femur intertrochanteric fracture, without significant comminution. Apex anterior angulation. Electronically Signed   By: Lajean Manes M.D.   On: 09/07/2021 11:39    Procedures Procedures    Medications Ordered in ED Medications  sodium chloride 0.9 % bolus 1,000 mL (has no administration in time range)  ketorolac (TORADOL) 15 MG/ML injection 15 mg (15 mg Intravenous Given 09/07/21 1057)  morphine (PF) 4 MG/ML injection 2 mg (2 mg Intravenous Given 09/07/21 1056)  HYDROmorphone (DILAUDID) injection 0.5 mg (0.5 mg Intravenous Given 09/07/21 1203)    ED Course/ Medical Decision Making/ A&P                           Medical Decision Making Amount and/or Complexity of Data Reviewed Labs: ordered. Radiology: ordered.  Risk Prescription drug management.   This patient is a 61 y.o. male who presents to the ED for concern of left hip, left knee pain after mechanical fall this involves an extensive number of treatment  options, and is a complaint that carries with it a high risk of complications and morbidity. The emergent differential diagnosis prior to evaluation includes, but is not limited to, new hip fracture, dislocation, knee fracture, dislocation, or other musculoskeletal injury, .   This is not an exhaustive differential.   Past Medical History / Co-morbidities / Social History: Hypertension, hyperlipidemia, acid reflux, arthritis, stroke, lung cancer, larynx cancer  Additional history: Chart reviewed. Pertinent results include: Reviewed recent outpatient radiation oncology, oncology, and lab work, imaging from previous emergency department visits  Physical Exam: Physical exam performed. The pertinent findings include: Patient with significant tenderness of left hip, decreased range of motion secondary to pain, not significantly shortened compared to contralateral side.  Lab Tests: I ordered, and personally interpreted labs.  The pertinent results include: CBC notable for mild leukocytosis, likely secondary to stress.  Mild anemia, hemoglobin 12.7 from baseline around 13-1/2, although most recent was slightly higher.  BMP notable for mild to moderate hyponatremia, sodium 129, with some associated hypochloremia chloride 92.   Imaging Studies: I ordered imaging studies including plain film radiograph of the left hip, and left knee. I independently visualized and interpreted imaging which showed nondisplaced left proximal femur intertrochanteric fracture, no acute fractures or dislocation of the left knee. I agree with the radiologist interpretation.   Medications: I ordered medication including morphine, Toradol, Dilaudid for pain, fluid bolus for hyponatremia. Reevaluation of the patient after these medicines showed that the patient improved. I have reviewed the patients home medicines and have made adjustments as needed.  Consultations Obtained: I requested consultation with the orthopedic doctor,  spoke with Dr. Amedeo Kinsman, and the hospitalist, spoke with Dr. Earnest Conroy,  and discussed lab and imaging findings as well as pertinent plan - they recommend: admission for new hip fracture and repair.   Disposition: After consideration of the diagnostic results and the patients response to treatment, I feel that patient would benefit from admission as discussed above. Marland Kitchen  I discussed this case with my attending physician Dr. Doren Custard who cosigned this note including patient's presenting symptoms, physical exam, and planned diagnostics and interventions. Attending physician stated agreement with plan or made changes to plan which were implemented.    Final Clinical Impression(s) / ED Diagnoses Final diagnoses:  Closed nondisplaced intertrochanteric fracture of left femur, initial encounter [facility])    Rx / DC Orders ED Discharge Orders     None         Dorien Chihuahua 09/07/21 1230    Godfrey Pick, MD 09/13/21 0106

## 2021-09-07 NOTE — H&P (Addendum)
History and Physical    [name] ZOX:[id number] DOB: Sep 16, 1960 DOA: 09/07/2021  PCP: [name], MD   Patient coming from: Home  Chief Complaint: Fall/L hip pain  HPI: [name] is a 61 y.o. male with medical history significant for CAD, prior CVA, hypertension, dyslipidemia, prior supraglottic squamous cell carcinoma, recurrent squamous cell lung cancer, and prior tobacco and alcohol use who presented to the ED with acute onset left hip pain and knee pain after a fall from his truck earlier this morning.  He denies any loss of consciousness or dizziness prior to the episode and states that he simply twisted around his cane getting out of his truck which led to the fall.  He denies any head injury and was not able to ambulate secondary to the pain.  He denies any numbness, tingling, back pain, neck pain, chest pain, shortness of breath, abdominal pain, nausea, or vomiting.  No fevers or chills noted.   ED Course: Stable vital signs noted with mild leukocytosis of 10,700.  Hemoglobin 12.7 and sodium 129.  Left proximal, nondisplaced femur fracture noted and EDP has discussed with orthopedics Dr. Amedeo Kinsman regarding the need for operative intervention.  Review of Systems: Reviewed as noted above, otherwise negative.  Past Medical History:  Diagnosis Date   Anemia    Anxiety    Arthritis    Cancer of larynx (HCC)    Carotid artery stenosis    Chronic lower back pain    Coronary artery disease    a. Diag cath 10/2012 for CP/abnormal nuc -> PCI s/p LAD atherectomy/DES placement 11/05/12.   Depression    Dilated aortic root (HCC)    GERD (gastroesophageal reflux disease)    History of hiatal hernia    Hypercholesteremia    Hypertension    Lung cancer ([place])    Pneumonia    S/P percutaneous endoscopic gastrostomy (PEG) tube placement [facility])    Stroke ([place]) 07/31/2018   Pons    Past Surgical History:  Procedure Laterality Date   BIOPSY  11/24/2019   Procedure: BIOPSY;   Surgeon: Irving Copas., MD;  Location: [place];  Service: Gastroenterology;;   BIOPSY  03/29/2020   Procedure: BIOPSY;  Surgeon: Irving Copas., MD;  Location: [facility];  Service: Gastroenterology;;   BRONCHIAL BIOPSY  07/26/2020   Procedure: BRONCHIAL BIOPSIES;  Surgeon: Garner Nash, DO;  Location: [place] [facility];  Service: Pulmonary;;   BRONCHIAL BRUSHINGS  07/26/2020   Procedure: BRONCHIAL BRUSHINGS;  Surgeon: Garner Nash, DO;  Location: [place] [facility];  Service: Pulmonary;;   BRONCHIAL NEEDLE ASPIRATION BIOPSY  07/26/2020   Procedure: BRONCHIAL NEEDLE ASPIRATION BIOPSIES;  Surgeon: Garner Nash, DO;  Location: [place];  Service: Pulmonary;;   CARDIAC CATHETERIZATION  10/29/2012   COLONOSCOPY WITH PROPOFOL N/A 11/24/2019   Procedure: COLONOSCOPY WITH PROPOFOL;  Surgeon: Irving Copas., MD;  Location: [place];  Service: Gastroenterology;  Laterality: N/A;   CORONARY ANGIOPLASTY WITH STENT PLACEMENT  11/05/2012   DIRECT LARYNGOSCOPY N/A 01/02/2019   Procedure: MICRO DIRECT LARYNGOSCOPY BIOPSY OF LARYNGEAL MASS;  Surgeon: [name], MD;  Location: [place];  Service: ENT;  Laterality: N/A;   ESOPHAGOGASTRODUODENOSCOPY (EGD) WITH PROPOFOL N/A 08/11/2019   Procedure: ATTEMPTED ESOPHAGOGASTRODUODENOSCOPY (EGD) WITH PROPOFOL (UNABLE TO PERFORM PERCUTANEOUS ENDOSCOPIC GASTROSTOMY TUBE REPLACEMENT);  Surgeon: [name], MD;  Location: [facility];  Service: Gastroenterology;  Laterality: N/A;   ESOPHAGOGASTRODUODENOSCOPY (EGD) WITH PROPOFOL N/A 11/24/2019   Procedure: ESOPHAGOGASTRODUODENOSCOPY (EGD) WITH PROPOFOL;  Surgeon: Rush Landmark,  Telford Nab., MD;  Location: [facility];  Service: Gastroenterology;  Laterality: N/A;   ESOPHAGOGASTRODUODENOSCOPY (EGD) WITH PROPOFOL N/A 01/26/2020   Procedure: ESOPHAGOGASTRODUODENOSCOPY (EGD) WITH PROPOFOL;  Surgeon: Rush Landmark Telford Nab., MD;  Location: [place];  Service: Gastroenterology;  Laterality: N/A;    ESOPHAGOGASTRODUODENOSCOPY (EGD) WITH PROPOFOL N/A 03/29/2020   Procedure: ESOPHAGOGASTRODUODENOSCOPY (EGD) WITH PROPOFOL;  Surgeon: Rush Landmark Telford Nab., MD;  Location: [facility];  Service: Gastroenterology;  Laterality: N/A;  NEEDS FLOURO   FIDUCIAL MARKER PLACEMENT  07/26/2020   Procedure: FIDUCIAL MARKER PLACEMENT;  Surgeon: Garner Nash, DO;  Location: [place] [facility];  Service: Pulmonary;;   HEMORRHOID SURGERY  ~ 2010   INSERTION OF MESH N/A 06/02/2015   Procedure: INSERTION OF MESH;  Surgeon: [name], MD;  Location: [facility];  Service: General;  Laterality: N/A;   IR ANGIO INTRA EXTRACRAN SEL COM CAROTID INNOMINATE BILAT MOD SED  08/02/2018   IR ANGIO VERTEBRAL SEL VERTEBRAL BILAT MOD SED  08/02/2018   IR REPLACE G-TUBE SIMPLE WO FLUORO  08/12/2019   IR [place] GASTRO/COLONIC TUBE PERCUT W/FLUORO  08/13/2019   LAPAROSCOPIC INSERTION GASTROSTOMY TUBE N/A 02/24/2019   Procedure: LAPAROSCOPIC INSERTION GASTROSTOMY TUBE;  Surgeon: [name], MD;  Location: [place];  Service: General;  Laterality: N/A;   LEFT HEART CATHETERIZATION WITH CORONARY ANGIOGRAM N/A 10/30/2012   Procedure: LEFT HEART CATHETERIZATION WITH CORONARY ANGIOGRAM;  Surgeon: [name], MD;  Location: [place] [facility];  Service: Cardiovascular;  Laterality: N/A;   MULTIPLE EXTRACTIONS WITH ALVEOLOPLASTY N/A 02/18/2019   Procedure: Extraction of tooth #'s 5-7, 10,11,14,20-29, 31 and 32 with alveoloplasty and maxiillary left buccal exostoses reductions;  Surgeon: [name], [name];  Location: [place];  Service: Oral Surgery;  Laterality: N/A;   PEG PLACEMENT N/A 01/26/2020   Procedure: PERCUTANEOUS ENDOSCOPIC GASTROSTOMY (PEG) REPLACEMENT;  Surgeon: Irving Copas., MD;  Location: [place];  Service: Gastroenterology;  Laterality: N/A;   PERCUTANEOUS CORONARY ROTOBLATOR INTERVENTION (PCI-R) N/A 11/05/2012   Procedure: PERCUTANEOUS CORONARY ROTOBLATOR INTERVENTION (PCI-R);  Surgeon: [name], MD;  Location:  [facility] C[facility];  Service: Cardiovascular;  Laterality: N/A;   SAVORY DILATION N/A 11/24/2019   Procedure: SAVORY DILATION;  Surgeon: Rush Landmark Telford Nab., MD;  Location: [place];  Service: Gastroenterology;  Laterality: N/A;   SAVORY DILATION N/A 01/26/2020   Procedure: SAVORY DILATION;  Surgeon: Rush Landmark Telford Nab., MD;  Location: [place];  Service: Gastroenterology;  Laterality: N/A;   TRACHEOSTOMY TUBE PLACEMENT N/A 02/18/2019   Procedure: Tracheostomy;  Surgeon: [name], MD;  Location: [place];  Service: ENT;  Laterality: N/A;   UMBILICAL HERNIA REPAIR N/A 06/02/2015   Procedure: UMBILICAL HERNIORRHAPHY WITH MESH;  Surgeon: [name], MD;  Location[facility];  Service: General;  Laterality: N/A;   VIDEO BRONCHOSCOPY WITH ENDOBRONCHIAL NAVIGATION Bilateral 07/26/2020   Procedure: VIDEO BRONCHOSCOPY WITH ENDOBRONCHIAL NAVIGATION;  Surgeon: Garner Nash, DO;  Location: [place];  Service: Pulmonary;  Laterality: Bilateral;  ION   VIDEO BRONCHOSCOPY WITH RADIAL ENDOBRONCHIAL ULTRASOUND  07/26/2020   Procedure: VIDEO BRONCHOSCOPY WITH RADIAL ENDOBRONCHIAL ULTRASOUND;  Surgeon: Garner Nash, DO;  Location: [place] [facility];  Service: Pulmonary;;     reports that he quit smoking about 14 years ago. His smoking use included cigarettes. He started smoking about 44 years ago. He has a 96.00 pack-year smoking history. He quit smokeless tobacco use about 20 years ago.  His smokeless tobacco use included chew. He reports that he does not currently use alcohol after a past usage  of about 12.0 standard drinks of alcohol per week. He reports that he does not currently use drugs after having used the following drugs: Marijuana.  Allergies  Allergen Reactions   Cymbalta [Duloxetine Hcl]    Duloxetine Other (See Comments)    Caused depression/ patient is taking   Prednisone Other (See Comments)    Chest pain    Family History  Problem Relation Age of Onset   Hypertension Mother     Heart disease Father    Hypertension Father    Heart disease Sister    Hypertension Maternal Grandmother    Hypertension Maternal Grandfather    Hypertension Paternal Grandmother    Hypertension Paternal Grandfather    Pancreatic cancer Maternal Uncle    Breast cancer Maternal Aunt    Breast cancer Maternal Aunt    Diabetes Paternal Uncle    Diabetes Paternal Aunt     Prior to Admission medications   Medication Sig Start Date End Date Taking? Authorizing Provider  acetaminophen (TYLENOL) 325 MG tablet Take 1.5 tablets (487.5 mg total) by mouth every 6 (six) hours as needed for mild pain, fever or headache (or Fever >/= 101). 05/02/20   Johnson, Clanford L, MD  albuterol (VENTOLIN HFA) 108 (90 Base) MCG/ACT inhaler Inhale 1-2 puffs into the lungs every 4 (four) hours as needed for wheezing or shortness of breath. 06/14/21   [provider]  amLODipine (NORVASC) 5 MG tablet Take 1 tablet (5 mg total) by mouth daily. 06/27/21 07/27/21  Dorie Rank, MD  aspirin EC 81 MG tablet Take 1 tablet (81 mg total) by mouth daily with breakfast. 04/15/20   Roxan Hockey, MD  atorvastatin (LIPITOR) 40 MG tablet Take 80 mg by mouth at bedtime. 01/17/20   [provider]  Baclofen 5 MG TABS Take 10 mg by mouth at bedtime. Patient taking differently: Take 5 mg by mouth at bedtime. 04/11/21   Frann Rider, NP  diazepam (VALIUM) 10 MG tablet Take 5-10 mg by mouth in the morning and at bedtime. 5mg  in the morning, and 10mg  at night, 1 extra tab if needed 05/25/20   [provider]  docusate sodium (COLACE) 100 MG capsule Take 100 mg by mouth 2 (two) times daily.    [provider]  dronabinol (MARINOL) 5 MG capsule TAKE 1 CAPSULE(5 MG) BY MOUTH TWICE DAILY BEFORE A MEAL 07/25/21   [name], MD  DULoxetine (CYMBALTA) 30 MG capsule Take 30 mg by mouth daily. 07/25/21   [provider]  HYDROcodone-acetaminophen (NORCO) 10-325 MG tablet Take 1 tablet by mouth every  4 (four) hours as needed. Patient taking differently: Take 1 tablet by mouth every 4 (four) hours as needed for moderate pain. 06/02/15   [name], MD  lactulose (CHRONULAC) 10 GM/15ML solution TAKE 30 MLS( 20 GIVE) BY MOUTH FOUR TIMES DAILY AS NEEDED Patient taking differently: Take 20 g by mouth 4 (four) times daily as needed for mild constipation. 05/13/21   Harriett Rush, PA-C  levothyroxine (SYNTHROID) 50 MCG tablet Take 50 mcg by mouth daily. 07/25/21   [provider]  metoprolol tartrate (LOPRESSOR) 25 MG tablet Take 12.5 mg by mouth 2 (two) times daily.    [provider]  nitroGLYCERIN (NITROSTAT) 0.4 MG SL tablet Place 1 tablet (0.4 mg total) under the tongue every 5 (five) minutes as needed for chest pain (CP or SOB). Patient not taking: Reported on 09/01/2021 07/27/14   Herminio Commons, MD  omeprazole (PRILOSEC) 40 MG capsule  TAKE 1 CAPSULE(40 MG) BY MOUTH DAILY Patient taking differently: Take 40 mg by mouth daily. 03/21/21   Willia Craze, NP  potassium chloride SA (KLOR-CON) 20 MEQ tablet Take 20 mEq by mouth daily. 04/22/20   [provider]  valproic acid (DEPAKENE) 250 MG/5ML solution TAKE 10 ML BY MOUTH THREE TIMES DAILY Patient taking differently: Take 500 mg by mouth 3 (three) times daily. 04/18/21   Frann Rider, NP  prochlorperazine (COMPAZINE) 10 MG tablet TAKE 1 TABLET(10 MG) BY MOUTH EVERY 6 HOURS AS NEEDED FOR NAUSEA 08/19/19 06/11/21  Glennie Isle, NP-C    Physical Exam: Vitals:   09/07/21 1200 09/07/21 1245 09/07/21 1309 09/07/21 1322  BP: (!) 157/85  (!) 143/75   Pulse:  64 (!) 57   Resp:  15 13   Temp:    97.9 F (36.6 C)  TempSrc:    Oral  SpO2:  100% 95%   Weight:      Height:        Constitutional: NAD, calm, comfortable Vitals:   09/07/21 1200 09/07/21 1245 09/07/21 1309 09/07/21 1322  BP: (!) 157/85  (!) 143/75   Pulse:  64 (!) 57   Resp:  15 13   Temp:    97.9 F (36.6 C)  TempSrc:    Oral  SpO2:   100% 95%   Weight:      Height:       Eyes: lids and conjunctivae normal Neck: normal, supple Respiratory: clear to auscultation bilaterally. Normal respiratory effort. No accessory muscle use.  Cardiovascular: Regular rate and rhythm, no murmurs. Abdomen: no tenderness, no distention. Bowel sounds positive.  Musculoskeletal:  No edema. Skin: no rashes, lesions, ulcers.  Psychiatric: Flat affect  Labs on Admission: I have personally reviewed following labs and imaging studies  CBC: Recent Labs  Lab 09/07/21 1152  WBC 10.7*  HGB 12.7*  HCT 38.3*  MCV 91.2  PLT 301   Basic Metabolic Panel: Recent Labs  Lab 09/07/21 1152  NA 129*  K 4.0  CL 92*  CO2 29  GLUCOSE 127*  BUN 11  CREATININE 0.88  CALCIUM 8.6*   GFR: Estimated Creatinine Clearance: 93.9 mL/min (by C-G formula based on SCr of 0.88 mg/dL). Liver Function Tests: No results for input(s): "AST", "ALT", "ALKPHOS", "BILITOT", "PROT", "ALBUMIN" in the last 168 hours. No results for input(s): "LIPASE", "AMYLASE" in the last 168 hours. No results for input(s): "AMMONIA" in the last 168 hours. Coagulation Profile: No results for input(s): "INR", "PROTIME" in the last 168 hours. Cardiac Enzymes: No results for input(s): "CKTOTAL", "CKMB", "CKMBINDEX", "TROPONINI" in the last 168 hours. BNP (last 3 results) No results for input(s): "PROBNP" in the last 8760 hours. HbA1C: No results for input(s): "HGBA1C" in the last 72 hours. CBG: No results for input(s): "GLUCAP" in the last 168 hours. Lipid Profile: No results for input(s): "CHOL", "HDL", "LDLCALC", "TRIG", "CHOLHDL", "LDLDIRECT" in the last 72 hours. Thyroid Function Tests: No results for input(s): "TSH", "T4TOTAL", "FREET4", "T3FREE", "THYROIDAB" in the last 72 hours. Anemia Panel: No results for input(s): "VITAMINB12", "FOLATE", "FERRITIN", "TIBC", "IRON", "RETICCTPCT" in the last 72 hours. Urine analysis:    Component Value Date/Time   COLORURINE STRAW  (A) 06/27/2021 1026   APPEARANCEUR CLEAR 06/27/2021 1026   LABSPEC 1.004 (L) 06/27/2021 1026   PHURINE 8.0 06/27/2021 1026   GLUCOSEU NEGATIVE 06/27/2021 1026   HGBUR NEGATIVE 06/27/2021 1026   BILIRUBINUR NEGATIVE 06/27/2021 [place] 06/27/2021 1026  PROTEINUR NEGATIVE 06/27/2021 1026   NITRITE NEGATIVE 06/27/2021 1026   LEUKOCYTESUR NEGATIVE 06/27/2021 1026    Radiological Exams on Admission: DG Knee Complete 4 Views Left  Result Date: 09/07/2021 CLINICAL DATA:  Fall EXAM: LEFT KNEE - COMPLETE 4+ VIEW COMPARISON:  None Available. FINDINGS: There is no acute fracture or dislocation. Alignment is normal. The joint spaces are preserved. There is no erosive change. There is no effusion. Dense vascular calcifications are noted. IMPRESSION: No acute fracture or dislocation. Electronically Signed   By: Valetta Mole M.D.   On: 09/07/2021 11:42   DG Hip Unilat W or Wo Pelvis 2-3 Views Left  Result Date: 09/07/2021 CLINICAL DATA:  Fall.  Hip pain. EXAM: DG HIP (WITH OR WITHOUT PELVIS) 2-3V LEFT COMPARISON:  None Available. FINDINGS: Nondisplaced, intertrochanteric fracture of the left proximal femur, with no significant comminution, apex anterior angulation but no significant valgus or varus angulation. No other fractures.  No bone lesions. Hip joints, SI joints and symphysis pubis are normally aligned. Dense atherosclerotic vascular calcifications. IMPRESSION: 1. Nondisplaced left proximal femur intertrochanteric fracture, without significant comminution. Apex anterior angulation. Electronically Signed   By: Lajean Manes M.D.   On: 09/07/2021 11:39     Assessment/Plan Principal Problem:   Hip fracture [facility]) Active Problems:   Hypertension   Hyponatremia   Coronary artery disease   Stroke (HCC)   Head and neck cancer (HCC)    Acute left proximal femur fracture status post mechanical fall N.p.o. after midnight Appreciate orthopedic recommendations with recommendations to  transfer to [facility] management with IV medications as ordered Fall precautions Eventual need for PT/OT  Hyponatremia Currently asymptomatic from this Denies any further alcohol use Likely in the setting of lung cancer IV fluid with normal saline Monitor with repeat labs Check TSH as noted below  Prior CVA/CAD Continue aspirin and statin  Hypertension Continue amlodipine  Dyslipidemia Continue statin  Hypothyroidism Continue Synthroid Check TSH/free T4  History of squamous cell lung cancer Planning for radiation therapy soon, follow-up outpatient  Anxiety/depression Can continue home Cymbalta and Valium  History of seizure disorder Continue valproic acid  History of supraglottic cancer No further treatments noted for now with no recurrence noted No longer uses PEG tube feedings and is on pured diet  GERD PPI   DVT prophylaxis: SCDs Code Status: Full Family Communication: Wife at bedside 9/6 Disposition Plan:Admit for hip fracture Consults called:Orthopedics by EDP, Dr. Amedeo Kinsman; recommending transfer to [facility] after discussion with anesthesia Admission status: Inpatient, Tele  Severity of Illness: The appropriate patient status for this patient is INPATIENT. Inpatient status is judged to be reasonable and necessary in order to provide the required intensity of service to ensure the patient's safety. The patient's presenting symptoms, physical exam findings, and initial radiographic and laboratory data in the context of their chronic comorbidities is felt to place them at high risk for further clinical deterioration. Furthermore, it is not anticipated that the patient will be medically stable for discharge from the hospital within 2 midnights of admission.   * I certify that at the point of admission it is my clinical judgment that the patient will require inpatient hospital care spanning beyond 2 midnights from the point of admission due to high intensity of service,  high risk for further deterioration and high frequency of surveillance required.*   Lorianne Malbrough D Khaylee Mcevoy DO [facility]  If 7PM-7AM, please contact night-coverage [email or web address]  09/07/2021, 1:28 PM

## 2021-09-07 NOTE — Progress Notes (Signed)
Patient ID: [name], male   DOB: 03/03/1960, 61 y.o.   MRN: [id number]  Orthopedics aware of pt, plan IMN tomorrow with Dr. Marcelino Scot. Please keep NPO after MN.    Lisette Abu, PA-C Orthopedic Surgery [phone]

## 2021-09-07 NOTE — ED Notes (Signed)
Patient transported to X-ray 

## 2021-09-07 NOTE — Progress Notes (Signed)
Pt has arrived to 5 N28. Alert and oriented x4, identified appropriately. No belongings brought to the hospital. VS stable. Pt oriented to room and equipment, instructed to use call bell for assistance, bed alarm in place, and call bell left within pt reach.

## 2021-09-07 NOTE — Telephone Encounter (Signed)
Received VM from patient's wife [name] that patient is currently at [facility] due to a fall and left hip fracture. She requested a return call to see if patient should be transferred to [facility] since he is scheduled to start radiation 9/15.  Relayed above to Dr. Isidore Moos who recommended that patient stay at [facility] and get hip fracture resolved. She believes patient should be discharged by his first SBRT treatment on 9/15. If patient is still in the hospital by middle of next week, we will work with hospital team to either transfer patient to [facility] or adjust radiation schedule.   Returned [name]'s call and relayed Dr. Pearlie Oyster recommendations. Informed her someone from our department would check on patient's status next week to see how he was progressing, and then call her with an updated plan regarding patient's radiation. [name] verbalized understanding and appreciation of call. Encouraged [name] to call back should she have any other questions or  concerns before next week.

## 2021-09-08 ENCOUNTER — Inpatient Hospital Stay (HOSPITAL_COMMUNITY): Payer: Medicare Other

## 2021-09-08 ENCOUNTER — Inpatient Hospital Stay (HOSPITAL_COMMUNITY): Payer: Medicare Other | Admitting: Anesthesiology

## 2021-09-08 ENCOUNTER — Encounter (HOSPITAL_COMMUNITY): Payer: Self-pay | Admitting: Internal Medicine

## 2021-09-08 ENCOUNTER — Other Ambulatory Visit: Payer: Self-pay

## 2021-09-08 ENCOUNTER — Encounter (HOSPITAL_COMMUNITY)
Admission: EM | Disposition: A | Discharge status: Skilled Nursing Facility | Payer: Self-pay | Source: Home / Self Care | Attending: Internal Medicine

## 2021-09-08 DIAGNOSIS — E039 Hypothyroidism, unspecified: Secondary | ICD-10-CM

## 2021-09-08 DIAGNOSIS — Z87891 Personal history of nicotine dependence: Secondary | ICD-10-CM

## 2021-09-08 DIAGNOSIS — I10 Essential (primary) hypertension: Secondary | ICD-10-CM

## 2021-09-08 DIAGNOSIS — S72002A Fracture of unspecified part of neck of left femur, initial encounter for closed fracture: Secondary | ICD-10-CM

## 2021-09-08 DIAGNOSIS — D638 Anemia in other chronic diseases classified elsewhere: Secondary | ICD-10-CM

## 2021-09-08 HISTORY — PX: INTRAMEDULLARY (IM) NAIL INTERTROCHANTERIC: SHX5875

## 2021-09-08 LAB — CBC
HCT: 31.3 % — ABNORMAL LOW (ref 39.0–52.0)
Hemoglobin: 10.8 g/dL — ABNORMAL LOW (ref 13.0–17.0)
MCH: 30.7 pg (ref 26.0–34.0)
MCHC: 34.5 g/dL (ref 30.0–36.0)
MCV: 88.9 fL (ref 80.0–100.0)
Platelets: 172 10*3/uL (ref 150–400)
RBC: 3.52 MIL/uL — ABNORMAL LOW (ref 4.22–5.81)
RDW: 13.8 % (ref 11.5–15.5)
WBC: 7.9 10*3/uL (ref 4.0–10.5)
nRBC: 0 % (ref 0.0–0.2)

## 2021-09-08 LAB — BASIC METABOLIC PANEL
Anion gap: 7 (ref 5–15)
BUN: 7 mg/dL (ref 6–20)
CO2: 25 mmol/L (ref 22–32)
Calcium: 8.3 mg/dL — ABNORMAL LOW (ref 8.9–10.3)
Chloride: 98 mmol/L (ref 98–111)
Creatinine, Ser: 0.77 mg/dL (ref 0.61–1.24)
GFR, Estimated: >60 mL/min (ref >60)
Glucose, Bld: 120 mg/dL — ABNORMAL HIGH (ref 70–99)
Potassium: 3.8 mmol/L (ref 3.5–5.1)
Sodium: 130 mmol/L — ABNORMAL LOW (ref 135–145)

## 2021-09-08 LAB — SURGICAL PCR SCREEN
MRSA, PCR: NEGATIVE
Staphylococcus aureus: NEGATIVE

## 2021-09-08 LAB — MAGNESIUM: Magnesium: 1.7 mg/dL (ref 1.7–2.4)

## 2021-09-08 SURGERY — FIXATION, FRACTURE, INTERTROCHANTERIC, WITH INTRAMEDULLARY ROD
Anesthesia: Monitor Anesthesia Care | Site: Hip | Laterality: Left

## 2021-09-08 MED ORDER — MIDAZOLAM HCL 2 MG/2ML IJ SOLN
INTRAMUSCULAR | Status: DC | PRN
  Administered 2021-09-08 (×2): 2 mg via INTRAVENOUS

## 2021-09-08 MED ORDER — HYDROCODONE-ACETAMINOPHEN 7.5-325 MG PO TABS
1.0000 | ORAL_TABLET | Freq: Four times a day (QID) | ORAL | Status: DC | PRN
  Administered 2021-09-08 – 2021-09-09 (×4): 2 via ORAL
  Filled 2021-09-08 (×4): qty 2

## 2021-09-08 MED ORDER — ACETAMINOPHEN 500 MG PO TABS
500.0000 mg | ORAL_TABLET | Freq: Two times a day (BID) | ORAL | Status: DC
  Administered 2021-09-08 – 2021-09-09 (×4): 500 mg via ORAL
  Filled 2021-09-08 (×5): qty 1

## 2021-09-08 MED ORDER — MIDAZOLAM HCL 2 MG/2ML IJ SOLN
INTRAMUSCULAR | Status: AC
  Filled 2021-09-08: qty 2

## 2021-09-08 MED ORDER — PHENOL 1.4 % MT LIQD: 1.0000 | OROMUCOSAL | Status: DC | PRN

## 2021-09-08 MED ORDER — ALBUMIN HUMAN 5 % IV SOLN: INTRAVENOUS | Status: DC | PRN

## 2021-09-08 MED ORDER — PHENYLEPHRINE HCL-NACL 20-0.9 MG/250ML-% IV SOLN
INTRAVENOUS | Status: DC | PRN
  Administered 2021-09-08: 40 ug/min via INTRAVENOUS

## 2021-09-08 MED ORDER — AMISULPRIDE (ANTIEMETIC) 5 MG/2ML IV SOLN: 10.0000 mg | Freq: Once | INTRAVENOUS | Status: DC | PRN

## 2021-09-08 MED ORDER — PROPOFOL 500 MG/50ML IV EMUL
INTRAVENOUS | Status: DC | PRN
  Administered 2021-09-08: 50 ug/kg/min via INTRAVENOUS

## 2021-09-08 MED ORDER — ENOXAPARIN SODIUM 40 MG/0.4ML IJ SOSY
40.0000 mg | PREFILLED_SYRINGE | INTRAMUSCULAR | Status: DC
  Administered 2021-09-09 – 2021-09-13 (×5): 40 mg via SUBCUTANEOUS
  Filled 2021-09-08 (×5): qty 0.4

## 2021-09-08 MED ORDER — MORPHINE SULFATE (PF) 2 MG/ML IV SOLN: 0.5000 mg | INTRAVENOUS | Status: DC | PRN

## 2021-09-08 MED ORDER — EPHEDRINE SULFATE-NACL 50-0.9 MG/10ML-% IV SOSY
PREFILLED_SYRINGE | INTRAVENOUS | Status: DC | PRN
  Administered 2021-09-08 (×4): 5 mg via INTRAVENOUS

## 2021-09-08 MED ORDER — MENTHOL 3 MG MT LOZG: 1.0000 | LOZENGE | OROMUCOSAL | Status: DC | PRN

## 2021-09-08 MED ORDER — ORAL CARE MOUTH RINSE: 15.0000 mL | Freq: Once | OROMUCOSAL | Status: DC

## 2021-09-08 MED ORDER — CHLORHEXIDINE GLUCONATE 0.12 % MT SOLN
OROMUCOSAL | Status: AC
  Administered 2021-09-08: 15 mL
  Filled 2021-09-08: qty 15

## 2021-09-08 MED ORDER — DOCUSATE SODIUM 100 MG PO CAPS
100.0000 mg | ORAL_CAPSULE | Freq: Two times a day (BID) | ORAL | Status: DC
  Administered 2021-09-08 – 2021-09-09 (×4): 100 mg via ORAL
  Filled 2021-09-08 (×4): qty 1

## 2021-09-08 MED ORDER — ONDANSETRON HCL 4 MG PO TABS
4.0000 mg | ORAL_TABLET | Freq: Four times a day (QID) | ORAL | Status: DC | PRN
  Administered 2021-09-08: 4 mg via ORAL
  Filled 2021-09-08: qty 1

## 2021-09-08 MED ORDER — SODIUM CHLORIDE 0.9 % IV SOLN: INTRAVENOUS | Status: DC | PRN

## 2021-09-08 MED ORDER — METOCLOPRAMIDE HCL 5 MG PO TABS: 5.0000 mg | ORAL_TABLET | Freq: Three times a day (TID) | ORAL | Status: DC | PRN

## 2021-09-08 MED ORDER — FENTANYL CITRATE (PF) 250 MCG/5ML IJ SOLN
INTRAMUSCULAR | Status: DC | PRN
  Administered 2021-09-08 (×3): 50 ug via INTRAVENOUS

## 2021-09-08 MED ORDER — POVIDONE-IODINE 10 % EX SWAB
2.0000 | Freq: Once | CUTANEOUS | Status: AC
Start: 2021-09-08 — End: 2021-09-08
  Administered 2021-09-08: 2 via TOPICAL

## 2021-09-08 MED ORDER — ONDANSETRON HCL 4 MG/2ML IJ SOLN
INTRAMUSCULAR | Status: DC | PRN
  Administered 2021-09-08: 4 mg via INTRAVENOUS

## 2021-09-08 MED ORDER — HYDROMORPHONE HCL 1 MG/ML IJ SOLN: 0.2500 mg | INTRAMUSCULAR | Status: DC | PRN

## 2021-09-08 MED ORDER — LACTATED RINGERS IV SOLN: INTRAVENOUS | Status: DC

## 2021-09-08 MED ORDER — TRANEXAMIC ACID-NACL 1000-0.7 MG/100ML-% IV SOLN
1000.0000 mg | Freq: Once | INTRAVENOUS | Status: AC
  Administered 2021-09-08: 1000 mg via INTRAVENOUS
  Filled 2021-09-08: qty 100

## 2021-09-08 MED ORDER — METOCLOPRAMIDE HCL 5 MG/ML IJ SOLN: 5.0000 mg | Freq: Three times a day (TID) | INTRAMUSCULAR | Status: DC | PRN

## 2021-09-08 MED ORDER — CEFAZOLIN SODIUM-DEXTROSE 2-4 GM/100ML-% IV SOLN
2.0000 g | Freq: Four times a day (QID) | INTRAVENOUS | Status: AC
  Administered 2021-09-08 (×2): 2 g via INTRAVENOUS
  Filled 2021-09-08 (×2): qty 100

## 2021-09-08 MED ORDER — OXYCODONE HCL 5 MG PO TABS: 5.0000 mg | ORAL_TABLET | Freq: Once | ORAL | Status: DC | PRN

## 2021-09-08 MED ORDER — CHLORHEXIDINE GLUCONATE 0.12 % MT SOLN: 15.0000 mL | Freq: Once | OROMUCOSAL | Status: DC

## 2021-09-08 MED ORDER — ONDANSETRON HCL 4 MG/2ML IJ SOLN
4.0000 mg | Freq: Once | INTRAMUSCULAR | Status: DC | PRN
Start: 2021-09-08 — End: 2021-09-08

## 2021-09-08 MED ORDER — ONDANSETRON HCL 4 MG/2ML IJ SOLN: 4.0000 mg | Freq: Four times a day (QID) | INTRAMUSCULAR | Status: DC | PRN

## 2021-09-08 MED ORDER — 0.9 % SODIUM CHLORIDE (POUR BTL) OPTIME
TOPICAL | Status: DC | PRN
  Administered 2021-09-08: 1000 mL

## 2021-09-08 MED ORDER — OXYCODONE HCL 5 MG/5ML PO SOLN: 5.0000 mg | Freq: Once | ORAL | Status: DC | PRN

## 2021-09-08 MED ORDER — PROPOFOL 10 MG/ML IV BOLUS
INTRAVENOUS | Status: AC
  Filled 2021-09-08: qty 20

## 2021-09-08 MED ORDER — ACETAMINOPHEN 325 MG PO TABS: 325.0000 mg | ORAL_TABLET | Freq: Four times a day (QID) | ORAL | Status: DC | PRN

## 2021-09-08 MED ORDER — FENTANYL CITRATE (PF) 250 MCG/5ML IJ SOLN
INTRAMUSCULAR | Status: AC
  Filled 2021-09-08: qty 5

## 2021-09-08 SURGICAL SUPPLY — 54 items
BAG COUNTER SPONGE SURGICOUNT (BAG) ×1 IMPLANT
BIT DRILL 4.3MMS DISTAL GRDTED (BIT) IMPLANT
BNDG COHESIVE 6X5 TAN STRL LF (GAUZE/BANDAGES/DRESSINGS) IMPLANT
BRUSH SCRUB EZ PLAIN DRY (MISCELLANEOUS) ×2 IMPLANT
COVER PERINEAL POST (MISCELLANEOUS) ×1 IMPLANT
COVER SURGICAL LIGHT HANDLE (MISCELLANEOUS) ×2 IMPLANT
DRAPE C-ARMOR (DRAPES) ×1 IMPLANT
DRAPE HALF SHEET 40X57 (DRAPES) IMPLANT
DRAPE INCISE IOBAN 66X45 STRL (DRAPES) ×1 IMPLANT
DRAPE ORTHO SPLIT 77X108 STRL (DRAPES)
DRAPE SURG ORHT 6 SPLT 77X108 (DRAPES) IMPLANT
DRAPE U-SHAPE 47X51 STRL (DRAPES) ×1 IMPLANT
DRESSING MEPILEX FLEX 4X4 (GAUZE/BANDAGES/DRESSINGS) IMPLANT
DRILL 4.3MMS DISTAL GRADUATED (BIT) ×1
DRSG EMULSION OIL 3X3 NADH (GAUZE/BANDAGES/DRESSINGS) ×1 IMPLANT
DRSG MEPILEX BORDER 4X4 (GAUZE/BANDAGES/DRESSINGS) ×1 IMPLANT
DRSG MEPILEX BORDER 4X8 (GAUZE/BANDAGES/DRESSINGS) ×1 IMPLANT
DRSG MEPILEX FLEX 4X4 (GAUZE/BANDAGES/DRESSINGS) ×3
ELECT REM PT RETURN 9FT ADLT (ELECTROSURGICAL) ×1
ELECTRODE REM PT RTRN 9FT ADLT (ELECTROSURGICAL) ×1 IMPLANT
GLOVE BIO SURGEON STRL SZ7.5 (GLOVE) ×1 IMPLANT
GLOVE BIO SURGEON STRL SZ8 (GLOVE) ×1 IMPLANT
GLOVE BIOGEL PI IND STRL 7.5 (GLOVE) ×1 IMPLANT
GLOVE BIOGEL PI IND STRL 8 (GLOVE) ×1 IMPLANT
GLOVE SURG ORTHO LTX SZ7.5 (GLOVE) ×2 IMPLANT
GOWN STRL REUS W/ TWL LRG LVL3 (GOWN DISPOSABLE) ×2 IMPLANT
GOWN STRL REUS W/ TWL XL LVL3 (GOWN DISPOSABLE) ×1 IMPLANT
GOWN STRL REUS W/TWL LRG LVL3 (GOWN DISPOSABLE) ×2
GOWN STRL REUS W/TWL XL LVL3 (GOWN DISPOSABLE) ×1
GUIDEPIN VERSANAIL DSP 3.2X444 (ORTHOPEDIC DISPOSABLE SUPPLIES) IMPLANT
GUIDEWIRE BALL NOSE 100CM (WIRE) IMPLANT
HIP FR NAIL LAG SCREW 10.5X110 (Orthopedic Implant) ×1 IMPLANT
KIT BASIN OR (CUSTOM PROCEDURE TRAY) ×1 IMPLANT
KIT TURNOVER KIT B (KITS) ×1 IMPLANT
MANIFOLD NEPTUNE II (INSTRUMENTS) ×1 IMPLANT
NAIL HIP FRA AFFIX 130X9X400 L (Nail) IMPLANT
NS IRRIG 1000ML POUR BTL (IV SOLUTION) ×1 IMPLANT
PACK GENERAL/GYN (CUSTOM PROCEDURE TRAY) ×1 IMPLANT
PAD ARMBOARD 7.5X6 YLW CONV (MISCELLANEOUS) ×2 IMPLANT
REAMER MOD HEAD 12.5 DISP (ORTHOPEDIC DISPOSABLE SUPPLIES) IMPLANT
SCREW BONE CORTICAL 5.0X42 (Screw) IMPLANT
SCREW LAG HIP FR NAIL 10.5X110 (Orthopedic Implant) IMPLANT
STAPLER VISISTAT 35W (STAPLE) ×1 IMPLANT
STOCKINETTE IMPERVIOUS LG (DRAPES) IMPLANT
SUT ETHILON 2 0 FS 18 (SUTURE) ×1 IMPLANT
SUT VIC AB 0 CT1 27 (SUTURE) ×1
SUT VIC AB 0 CT1 27XBRD ANBCTR (SUTURE) ×1 IMPLANT
SUT VIC AB 1 CT1 27 (SUTURE)
SUT VIC AB 1 CT1 27XBRD ANBCTR (SUTURE) ×1 IMPLANT
SUT VIC AB 2-0 CT1 27 (SUTURE) ×1
SUT VIC AB 2-0 CT1 TAPERPNT 27 (SUTURE) ×1 IMPLANT
TOWEL GREEN STERILE (TOWEL DISPOSABLE) ×2 IMPLANT
TOWEL GREEN STERILE FF (TOWEL DISPOSABLE) ×1 IMPLANT
WATER STERILE IRR 1000ML POUR (IV SOLUTION) ×1 IMPLANT

## 2021-09-08 NOTE — Plan of Care (Signed)

## 2021-09-08 NOTE — Anesthesia Postprocedure Evaluation (Signed)
Anesthesia Post Note  Patient: [name]  Procedure(s) Performed: INTRAMEDULLARY NAILING OF LEFT FEMUR (Left: Hip)     Patient location during evaluation: PACU Anesthesia Type: MAC and Spinal Level of consciousness: awake and alert and oriented Pain management: pain level controlled Vital Signs Assessment: post-procedure vital signs reviewed and stable Respiratory status: spontaneous breathing, nonlabored ventilation and respiratory function stable Cardiovascular status: blood pressure returned to baseline and stable Postop Assessment: no headache, no backache, spinal receding and patient able to bend at knees Anesthetic complications: no   No notable events documented.  Last Vitals:  Vitals:   09/08/21 1211 09/08/21 1243  BP: 127/76   Pulse: 78 82  Resp: 13   Temp: 36.8 C   SpO2: 94%     Last Pain:  Vitals:   09/08/21 1211  TempSrc: Oral  PainSc:                  [name]

## 2021-09-08 NOTE — Anesthesia Preprocedure Evaluation (Addendum)
Anesthesia Evaluation  Patient identified by MRN, date of birth, ID band Patient awake    Reviewed: Allergy & Precautions, NPO status , Patient's Chart, lab work & pertinent test results, reviewed documented beta blocker date and time   Airway Mallampati: III  TM Distance: >3 FB Neck ROM: Full    Dental  (+) Edentulous Upper, Edentulous Lower   Pulmonary former smoker,  Hx laryngeal ca s/p trach/peg, prior supraglottic squamous cell carcinoma 100 pack year history    Pulmonary exam normal breath sounds clear to auscultation       Cardiovascular hypertension, Pt. on medications and Pt. on home beta blockers Normal cardiovascular exam Rhythm:Regular Rate:Normal     Neuro/Psych PSYCHIATRIC DISORDERS Anxiety Depression CVA    GI/Hepatic hiatal hernia, GERD  Controlled,(+)     substance abuse  marijuana use,   Endo/Other  Hypothyroidism   Renal/GU negative Renal ROS  negative genitourinary   Musculoskeletal  (+) Arthritis , Osteoarthritis,    Abdominal   Peds negative pediatric ROS (+)  Hematology  (+) Blood dyscrasia, anemia , Hb 10.8, plt 172   Anesthesia Other Findings   Reproductive/Obstetrics negative OB ROS                            Anesthesia Physical Anesthesia Plan  ASA: 4  Anesthesia Plan: Spinal and MAC   Post-op Pain Management: Tylenol PO (pre-op)*   Induction:   PONV Risk Score and Plan: 2 and Propofol infusion and TIVA  Airway Management Planned: Natural Airway and Nasal Cannula  Additional Equipment: None  Intra-op Plan:   Post-operative Plan:   Informed Consent: I have reviewed the patients History and Physical, chart, labs and discussed the procedure including the risks, benefits and alternatives for the proposed anesthesia with the patient or authorized representative who has indicated his/her understanding and acceptance.       Plan Discussed with:  CRNA  Anesthesia Plan Comments:        Anesthesia Quick Evaluation

## 2021-09-08 NOTE — Anesthesia Procedure Notes (Signed)
Procedure Name: MAC Date/Time: 09/08/2021 8:20 AM  Performed by: Reece Agar, CRNAPre-anesthesia Checklist: Patient identified, Emergency Drugs available, Suction available and Patient being monitored Patient Re-evaluated:Patient Re-evaluated prior to induction Oxygen Delivery Method: Simple face mask

## 2021-09-08 NOTE — Anesthesia Procedure Notes (Addendum)
Spinal  Patient location during procedure: OR Start time: 09/08/2021 8:02 AM End time: 09/08/2021 8:30 AM Reason for block: surgical anesthesia Staffing Performed: anesthesiologist  Anesthesiologist: [name], DO Performed by: [name], DO Authorized by: [name], DO   Preanesthetic Checklist Completed: patient identified, IV checked, risks and benefits discussed, surgical consent, monitors and equipment checked, pre-op evaluation and timeout performed Spinal Block Patient position: left lateral decubitus Prep: DuraPrep and site prepped and draped Patient monitoring: cardiac monitor, continuous pulse ox and blood pressure Approach: midline Location: L3-4 Injection technique: single-shot Needle Needle type: Pencan  Needle gauge: 24 G Needle length: 9 cm Assessment Sensory level: T6 Events: CSF return Additional Notes Functioning IV was confirmed and monitors were applied. Sterile prep and drape, including hand hygiene and sterile gloves were used. The patient was positioned and the spine was prepped. The skin was anesthetized with lidocaine.  Free flow of clear CSF was obtained prior to injecting local anesthetic into the CSF.  The spinal needle aspirated freely following injection.  The needle was carefully withdrawn.  The patient tolerated the procedure well.

## 2021-09-08 NOTE — Op Note (Signed)
09/08/2021  11:00 AM  PATIENT:  [name]  1960/11/02 male   MEDICAL RECORD NUMBER: [id number]  PRE-OPERATIVE DIAGNOSIS:  Left Hip Intertrochanteric Fracture  POST-OPERATIVE DIAGNOSIS:  Left Hip Intertrochanteric Fracture  PROCEDURE:  INTRAMEDULLARY NAILING OF THE LEFT HIP using a Biomet Affixus nail, 9 X 400 STATICALLY LOCKED.  SURGEON:  Astrid Divine. Marcelino Scot, M.D.  ASSISTANTMarlynn Perking.  ANESTHESIA:  General.  COMPLICATIONS:  None.  ESTIMATED BLOOD LOSS:  Less than 150 mL.  DISPOSITION:  To PACU.  CONDITION:  Stable.  DELAY START OF DVT PROPHYLAXIS BECAUSE OF BLEEDING RISK: NO  BRIEF SUMMARY AND INDICATION OF PROCEDURE:  [name] is a 61 y.o. year- old with multiple medical problems including metastatic cancer.  I discussed with the patient risks and benefits of surgical treatment including the potential for malunion, nonunion, symptomatic hardware, the elevated risks of thromboembolic disease, heart attack, stroke, neurovascular injury, bleeding, and others.  After full discussion, the patient provided consent to proceed.  BRIEF SUMMARY OF PROCEDURE:  The patient was taken to the operating room where general anesthesia was induced. He was positioned supine on the Hana fracture table.  A closed reduction maneuver was performed of the fractured proximal femur and this was confirmed on both AP and lateral xray views. A thorough scrub and wash with chlorhexidine and then Betadine scrub and paint was performed.  After sterile drapes and time-out, a long instrument was used to identify the appropriate starting position under C-arm on both AP and lateral images.  A 3 cm incision was made proximal to the greater trochanter.  The curved cannulated awl was inserted just medial to the tip of the lateral trochanter and then the starting guidewire advanced into the proximal femur.  This was checked on AP and lateral views.  The starting reamer was engaged with the soft tissue protected by a  sleeve.  The curved ball-tipped guidewire was then inserted, making sure it was as posterior as possible in the distal femur and across the fracture site, which stayed in a reduced position.  It was sequentially reamed up to 11 mm and an 9 x 400 mm nail inserted to the appropriate depth.  The guidewire for the lag screw was then inserted with the appropriate anteversion to make sure it was in a center-center position.  This was measured and the lag screw placed with excellent purchase and position checked on both views.  Traction was released and compression achieved with the compression device.  The set screw was then engaged within the groove of the lag screw, which was allowed to Telescope but not rotate.  This was followed by placement of one distal locking screw using perfect circle technique.  This was confirmed on AP and lateral images. Wounds were irrigated thoroughly, closed in a standard layered fashion. Sterile gently compressive dressings were applied. The patient was awakened from anesthesia and transported to the PACU in stable condition.  PROGNOSIS:  The patient will be weightbearing as tolerated with physical therapy beginning DVT prophylaxis today. He has no range of motion precautions.  We will continue to follow throughout hospital stay.  Anticipate follow up in the office in 2 weeks for removal of sutures and further evaluation.     Astrid Divine. Marcelino Scot, M.D.

## 2021-09-08 NOTE — Progress Notes (Signed)
Pt transported to short stay.

## 2021-09-08 NOTE — Plan of Care (Signed)
Problem: Education: Goal: Knowledge of General Education information will improve Description: Including pain rating scale, medication(s)/side effects and non-pharmacologic comfort measures 09/08/2021 1712 by Bridgette Habermann, RN Outcome: Progressing 09/08/2021 1711 by Bridgette Habermann, RN Outcome: Progressing   Problem: Health Behavior/Discharge Planning: Goal: Ability to manage health-related needs will improve 09/08/2021 1712 by Bridgette Habermann, RN Outcome: Progressing 09/08/2021 1711 by Bridgette Habermann, RN Outcome: Progressing   Problem: Clinical Measurements: Goal: Ability to maintain clinical measurements within normal limits will improve 09/08/2021 1712 by Bridgette Habermann, RN Outcome: Progressing 09/08/2021 1711 by Bridgette Habermann, RN Outcome: Progressing Goal: Will remain free from infection 09/08/2021 1712 by Bridgette Habermann, RN Outcome: Progressing 09/08/2021 1711 by Bridgette Habermann, RN Outcome: Progressing Goal: Diagnostic test results will improve 09/08/2021 1712 by Bridgette Habermann, RN Outcome: Progressing 09/08/2021 1711 by Bridgette Habermann, RN Outcome: Progressing Goal: Respiratory complications will improve 09/08/2021 1712 by Bridgette Habermann, RN Outcome: Progressing 09/08/2021 1711 by Bridgette Habermann, RN Outcome: Progressing Goal: Cardiovascular complication will be avoided 09/08/2021 1712 by Bridgette Habermann, RN Outcome: Progressing 09/08/2021 1711 by Bridgette Habermann, RN Outcome: Progressing   Problem: Activity: Goal: Risk for activity intolerance will decrease 09/08/2021 1712 by Bridgette Habermann, RN Outcome: Progressing 09/08/2021 1711 by Bridgette Habermann, RN Outcome: Progressing   Problem: Nutrition: Goal: Adequate nutrition will be maintained 09/08/2021 1712 by Bridgette Habermann, RN Outcome: Progressing 09/08/2021 1711 by Bridgette Habermann, RN Outcome: Progressing   Problem: Coping: Goal: Level of anxiety will decrease 09/08/2021 1712 by Bridgette Habermann, RN Outcome:  Progressing 09/08/2021 1711 by Bridgette Habermann, RN Outcome: Progressing   Problem: Elimination: Goal: Will not experience complications related to bowel motility 09/08/2021 1712 by Bridgette Habermann, RN Outcome: Progressing 09/08/2021 1711 by Bridgette Habermann, RN Outcome: Progressing Goal: Will not experience complications related to urinary retention 09/08/2021 1712 by Bridgette Habermann, RN Outcome: Progressing 09/08/2021 1711 by Bridgette Habermann, RN Outcome: Progressing   Problem: Pain Managment: Goal: General experience of comfort will improve 09/08/2021 1712 by Bridgette Habermann, RN Outcome: Progressing 09/08/2021 1711 by Bridgette Habermann, RN Outcome: Progressing   Problem: Safety: Goal: Ability to remain free from injury will improve 09/08/2021 1712 by Bridgette Habermann, RN Outcome: Progressing 09/08/2021 1711 by Bridgette Habermann, RN Outcome: Progressing   Problem: Skin Integrity: Goal: Risk for impaired skin integrity will decrease 09/08/2021 1712 by Bridgette Habermann, RN Outcome: Progressing 09/08/2021 1711 by Bridgette Habermann, RN Outcome: Progressing   Problem: Education: Goal: Verbalization of understanding the information provided (i.e., activity precautions, restrictions, etc) will improve 09/08/2021 1712 by Bridgette Habermann, RN Outcome: Progressing 09/08/2021 1711 by Bridgette Habermann, RN Outcome: Progressing Goal: Individualized Educational Video(s) 09/08/2021 1712 by Bridgette Habermann, RN Outcome: Progressing 09/08/2021 1711 by Bridgette Habermann, RN Outcome: Progressing   Problem: Activity: Goal: Ability to ambulate and perform ADLs will improve 09/08/2021 1712 by Bridgette Habermann, RN Outcome: Progressing 09/08/2021 1711 by Bridgette Habermann, RN Outcome: Progressing   Problem: Clinical Measurements: Goal: Postoperative complications will be avoided or minimized 09/08/2021 1712 by Bridgette Habermann, RN Outcome: Progressing 09/08/2021 1711 by Bridgette Habermann, RN Outcome: Progressing    Problem: Self-Concept: Goal: Ability to maintain and perform role responsibilities to the fullest extent possible will improve 09/08/2021 1712 by Bridgette Habermann, RN Outcome: Progressing 09/08/2021 1711 by Bridgette Habermann, RN Outcome: Progressing   Problem: Pain Management: Goal:  Pain level will decrease 09/08/2021 1712 by Bridgette Habermann, RN Outcome: Progressing 09/08/2021 1711 by Bridgette Habermann, RN Outcome: Progressing

## 2021-09-08 NOTE — Progress Notes (Signed)
Report given to [place], CRNA. Pt did not have a recent EKG ordered, she will place order for EKG.

## 2021-09-08 NOTE — Evaluation (Signed)
Clinical/Bedside Swallow Evaluation Patient Details  Name: [name] MRN: [id number] Date of Birth: 1960/09/07  Today's Date: 09/08/2021 Time: SLP Start Time (ACUTE ONLY): 49 SLP Stop Time (ACUTE ONLY): 0152 SLP Time Calculation (min) (ACUTE ONLY): 11 min  Past Medical History:  Past Medical History:  Diagnosis Date   Anemia    Anxiety    Arthritis    Cancer of larynx (HCC)    Carotid artery stenosis    Chronic lower back pain    Coronary artery disease    a. Diag cath 10/2012 for CP/abnormal nuc -> PCI s/p LAD atherectomy/DES placement 11/05/12.   Depression    Dilated aortic root (HCC)    GERD (gastroesophageal reflux disease)    History of hiatal hernia    Hypercholesteremia    Hypertension    Lung cancer ([place])    Pneumonia    S/P percutaneous endoscopic gastrostomy (PEG) tube placement [facility])    Stroke ([place]) 07/31/2018   Pons   Past Surgical History:  Past Surgical History:  Procedure Laterality Date   BIOPSY  11/24/2019   Procedure: BIOPSY;  Surgeon: Irving Copas., MD;  Location: [place];  Service: Gastroenterology;;   BIOPSY  03/29/2020   Procedure: BIOPSY;  Surgeon: Irving Copas., MD;  Location: [facility];  Service: Gastroenterology;;   BRONCHIAL BIOPSY  07/26/2020   Procedure: BRONCHIAL BIOPSIES;  Surgeon: Garner Nash, DO;  Location: [place] [facility];  Service: Pulmonary;;   BRONCHIAL BRUSHINGS  07/26/2020   Procedure: BRONCHIAL BRUSHINGS;  Surgeon: Garner Nash, DO;  Location: [place] [facility];  Service: Pulmonary;;   BRONCHIAL NEEDLE ASPIRATION BIOPSY  07/26/2020   Procedure: BRONCHIAL NEEDLE ASPIRATION BIOPSIES;  Surgeon: Garner Nash, DO;  Location: [place];  Service: Pulmonary;;   CARDIAC CATHETERIZATION  10/29/2012   COLONOSCOPY WITH PROPOFOL N/A 11/24/2019   Procedure: COLONOSCOPY WITH PROPOFOL;  Surgeon: Irving Copas., MD;  Location: [place];  Service: Gastroenterology;  Laterality: N/A;   CORONARY  ANGIOPLASTY WITH STENT PLACEMENT  11/05/2012   DIRECT LARYNGOSCOPY N/A 01/02/2019   Procedure: MICRO DIRECT LARYNGOSCOPY BIOPSY OF LARYNGEAL MASS;  Surgeon: [name], MD;  Location: [place];  Service: ENT;  Laterality: N/A;   ESOPHAGOGASTRODUODENOSCOPY (EGD) WITH PROPOFOL N/A 08/11/2019   Procedure: ATTEMPTED ESOPHAGOGASTRODUODENOSCOPY (EGD) WITH PROPOFOL (UNABLE TO PERFORM PERCUTANEOUS ENDOSCOPIC GASTROSTOMY TUBE REPLACEMENT);  Surgeon: [name], MD;  Location: [facility];  Service: Gastroenterology;  Laterality: N/A;   ESOPHAGOGASTRODUODENOSCOPY (EGD) WITH PROPOFOL N/A 11/24/2019   Procedure: ESOPHAGOGASTRODUODENOSCOPY (EGD) WITH PROPOFOL;  Surgeon: Rush Landmark Telford Nab., MD;  Location: [place];  Service: Gastroenterology;  Laterality: N/A;   ESOPHAGOGASTRODUODENOSCOPY (EGD) WITH PROPOFOL N/A 01/26/2020   Procedure: ESOPHAGOGASTRODUODENOSCOPY (EGD) WITH PROPOFOL;  Surgeon: Rush Landmark Telford Nab., MD;  Location: [place];  Service: Gastroenterology;  Laterality: N/A;   ESOPHAGOGASTRODUODENOSCOPY (EGD) WITH PROPOFOL N/A 03/29/2020   Procedure: ESOPHAGOGASTRODUODENOSCOPY (EGD) WITH PROPOFOL;  Surgeon: Rush Landmark Telford Nab., MD;  Location: [facility];  Service: Gastroenterology;  Laterality: N/A;  NEEDS FLOURO   FIDUCIAL MARKER PLACEMENT  07/26/2020   Procedure: FIDUCIAL MARKER PLACEMENT;  Surgeon: Garner Nash, DO;  Location: [place] [facility];  Service: Pulmonary;;   HEMORRHOID SURGERY  ~ 2010   INSERTION OF MESH N/A 06/02/2015   Procedure: INSERTION OF MESH;  Surgeon: [name], MD;  Location: [facility];  Service: General;  Laterality: N/A;   IR ANGIO INTRA EXTRACRAN SEL COM CAROTID INNOMINATE BILAT MOD SED  08/02/2018   IR ANGIO VERTEBRAL SEL VERTEBRAL BILAT MOD SED  08/02/2018  IR REPLACE G-TUBE SIMPLE WO FLUORO  08/12/2019   IR REPLC GASTRO/COLONIC TUBE PERCUT W/FLUORO  08/13/2019   LAPAROSCOPIC INSERTION GASTROSTOMY TUBE N/A 02/24/2019   Procedure: LAPAROSCOPIC INSERTION GASTROSTOMY  TUBE;  Surgeon: [name], MD;  Location: [place];  Service: General;  Laterality: N/A;   LEFT HEART CATHETERIZATION WITH CORONARY ANGIOGRAM N/A 10/30/2012   Procedure: LEFT HEART CATHETERIZATION WITH CORONARY ANGIOGRAM;  Surgeon: [name], MD;  Location: [place] [facility];  Service: Cardiovascular;  Laterality: N/A;   MULTIPLE EXTRACTIONS WITH ALVEOLOPLASTY N/A 02/18/2019   Procedure: Extraction of tooth #'s 5-7, 10,11,14,20-29, 31 and 32 with alveoloplasty and maxiillary left buccal exostoses reductions;  Surgeon: [name], [name];  Location: [place];  Service: Oral Surgery;  Laterality: N/A;   PEG PLACEMENT N/A 01/26/2020   Procedure: PERCUTANEOUS ENDOSCOPIC GASTROSTOMY (PEG) REPLACEMENT;  Surgeon: Irving Copas., MD;  Location: [place];  Service: Gastroenterology;  Laterality: N/A;   PERCUTANEOUS CORONARY ROTOBLATOR INTERVENTION (PCI-R) N/A 11/05/2012   Procedure: PERCUTANEOUS CORONARY ROTOBLATOR INTERVENTION (PCI-R);  Surgeon: [name], MD;  [facility];  Service: Cardiovascular;  Laterality: N/A;   SAVORY DILATION N/A 11/24/2019   Procedure: SAVORY DILATION;  Surgeon: Rush Landmark Telford Nab., MD;  Location: [place];  Service: Gastroenterology;  Laterality: N/A;   SAVORY DILATION N/A 01/26/2020   Procedure: SAVORY DILATION;  Surgeon: Rush Landmark Telford Nab., MD;  Location: [place];  Service: Gastroenterology;  Laterality: N/A;   TRACHEOSTOMY TUBE PLACEMENT N/A 02/18/2019   Procedure: Tracheostomy;  Surgeon: [name], MD;  Location: [place];  Service: ENT;  Laterality: N/A;   UMBILICAL HERNIA REPAIR N/A 06/02/2015   Procedure: UMBILICAL HERNIORRHAPHY WITH MESH;  Surgeon: [name], MD;  Location[facility];  Service: General;  Laterality: N/A;   VIDEO BRONCHOSCOPY WITH ENDOBRONCHIAL NAVIGATION Bilateral 07/26/2020   Procedure: VIDEO BRONCHOSCOPY WITH ENDOBRONCHIAL NAVIGATION;  Surgeon: Garner Nash, DO;  Location: [place];  Service: Pulmonary;   Laterality: Bilateral;  ION   VIDEO BRONCHOSCOPY WITH RADIAL ENDOBRONCHIAL ULTRASOUND  07/26/2020   Procedure: VIDEO BRONCHOSCOPY WITH RADIAL ENDOBRONCHIAL ULTRASOUND;  Surgeon: Garner Nash, DO;  Location: [place];  Service: Pulmonary;;   HPI:  [name] is a 61 y.o. male with medical history significant for supraglottic squamous cell carcinoma, recurrent squamous cell lung cancer,  CAD, CVA, hypertension, dyslipidemia, tobacco/alcohol use who presented to the ED with acute onset left hip pain and knee pain after a fall from his truck earlier this morning. Underwent hip surgery am of 9/7. Radiation for supraglottic cancer 2 years ago with PEG and now eats puree diet due to lack of dentition.MBS 02/21/19 without penetration or aspiration. Puree and thin recommended.    Assessment / Plan / Recommendation  Clinical Impression  Bedside swallow assessment was limited as pt sleepy and in pain from hip surgery this morning. He stated he does cough at times during meals and consumes puree diet at home given endentulous state. Palpation to neck appreciated only minimal fibrotic tissue and oral motor exam unremarkable. There were signs of possible aspiration with puree and thin water demonstrated by immediate cough. Suspect dysphagia is acute on chronic following this hip fracture and hospitalization with history of laryngeal cancer. Recommending strategies to increase safety such as consume po's only when alert, sit upright, small sips of thin liquid, continue puree and pills whole in puree (if unable with thin). Speech therapy will follow up to continue diet or recommend objective assessment if needed. SLP Visit Diagnosis: Dysphagia,  unspecified (R13.10)    Aspiration Risk  Mild aspiration risk    Diet Recommendation Dysphagia 1 (Puree);Thin liquid   Liquid Administration via: Cup;Straw Medication Administration: Whole meds with puree Supervision: Patient able to self feed Compensations: Slow  rate;Small sips/bites Postural Changes: Seated upright at 90 degrees    Other  Recommendations Oral Care Recommendations: Oral care BID    Recommendations for follow up therapy are one component of a multi-disciplinary discharge planning process, led by the attending physician.  Recommendations may be updated based on patient status, additional functional criteria and insurance authorization.  Follow up Recommendations  (TBD)      Assistance Recommended at Discharge  (TBD)  Functional Status Assessment Patient has had a recent decline in their functional status and demonstrates the ability to make significant improvements in function in a reasonable and predictable amount of time.  Frequency and Duration min 2x/week  2 weeks       Prognosis Prognosis for Safe Diet Advancement: Good      Swallow Study   General Date of Onset: 09/07/21 HPI: [name] is a 61 y.o. male with medical history significant for supraglottic squamous cell carcinoma, recurrent squamous cell lung cancer,  CAD, CVA, hypertension, dyslipidemia, tobacco/alcohol use who presented to the ED with acute onset left hip pain and knee pain after a fall from his truck earlier this morning. Underwent hip surgery am of 9/7. Radiation for supraglottic cancer 2 years ago with PEG and now eats puree diet due to lack of dentition.MBS 02/21/19 without penetration or aspiration. Puree and thin recommended. Type of Study: Bedside Swallow Evaluation Previous Swallow Assessment:  (MBS 2021 rec puree and thin) Diet Prior to this Study: Thin liquids;Dysphagia 1 (puree) Temperature Spikes Noted: No Respiratory Status: Nasal cannula History of Recent Intubation: Yes Length of Intubations (days):  (several hours for surgery) Date extubated: 09/08/21 Behavior/Cognition: Lethargic/Drowsy;Cooperative Oral Cavity Assessment: Within Functional Limits Oral Care Completed by SLP: No Oral Cavity - Dentition: Edentulous (extracted prior to  radiation/chemo-no dentures) Vision: Functional for self-feeding Self-Feeding Abilities: Able to feed self Patient Positioning: Upright in bed Baseline Vocal Quality: Normal    Oral/Motor/Sensory Function Overall Oral Motor/Sensory Function: Within functional limits   Ice Chips Ice chips: Not tested   Thin Liquid Thin Liquid: Impaired Presentation: Straw Pharyngeal  Phase Impairments: Cough - Immediate    Nectar Thick Nectar Thick Liquid: Not tested   Honey Thick Honey Thick Liquid: Not tested   Puree Puree: Impaired Pharyngeal Phase Impairments: Cough - Immediate   Solid     Solid: Not tested      Houston Siren 09/08/2021,2:57 PM

## 2021-09-08 NOTE — Transfer of Care (Signed)
Immediate Anesthesia Transfer of Care Note  Patient: [name]  Procedure(s) Performed: INTRAMEDULLARY NAILING OF LEFT FEMUR (Left: Hip)  Patient Location: PACU  Anesthesia Type:MAC and Spinal  Level of Consciousness: awake and alert   Airway & Oxygen Therapy: Patient Spontanous Breathing and Patient connected to nasal cannula oxygen  Post-op Assessment: Report given to RN and Post -op Vital signs reviewed and stable  Post vital signs: Reviewed and stable  Last Vitals:  Vitals Value Taken Time  BP 101/58 09/08/21 1017  Temp    Pulse 65 09/08/21 1018  Resp 12 09/08/21 1018  SpO2 97 % 09/08/21 1018  Vitals shown include unvalidated device data.  Last Pain:  Vitals:   09/08/21 0719  TempSrc:   PainSc: 10-Worst pain ever         Complications: No notable events documented.

## 2021-09-08 NOTE — Consult Note (Signed)
Orthopaedic Trauma Service (OTS) Consultation   Patient ID: [name] MRN: [id number] DOB/AGE: 1960-02-16 61 y.o.   Reason for Consult:left hip intertroch fracture Referring Physician: Heath Lark, DO  HPI: [name] is an 61 y.o. male with multiple medical problems who fell at home with immediate left hip pain and inability to bear weight. Pain is currently well controlled, aching and dull, sharp and severe with motion, without associated distal tingling or numbness, and improved with narcotics.    Past Medical History:  Diagnosis Date   Anemia    Anxiety    Arthritis    Cancer of larynx (HCC)    Carotid artery stenosis    Chronic lower back pain    Coronary artery disease    a. Diag cath 10/2012 for CP/abnormal nuc -> PCI s/p LAD atherectomy/DES placement 11/05/12.   Depression    Dilated aortic root (HCC)    GERD (gastroesophageal reflux disease)    History of hiatal hernia    Hypercholesteremia    Hypertension    Lung cancer ([place])    Pneumonia    S/P percutaneous endoscopic gastrostomy (PEG) tube placement [facility])    Stroke ([place]) 07/31/2018   Pons    Past Surgical History:  Procedure Laterality Date   BIOPSY  11/24/2019   Procedure: BIOPSY;  Surgeon: Irving Copas., MD;  Location: [place];  Service: Gastroenterology;;   BIOPSY  03/29/2020   Procedure: BIOPSY;  Surgeon: Irving Copas., MD;  Location: [facility];  Service: Gastroenterology;;   BRONCHIAL BIOPSY  07/26/2020   Procedure: BRONCHIAL BIOPSIES;  Surgeon: Garner Nash, DO;  Location: [facility];  Service: Pulmonary;;   BRONCHIAL BRUSHINGS  07/26/2020   Procedure: BRONCHIAL BRUSHINGS;  Surgeon: Garner Nash, DO;  Location: [facility];  Service: Pulmonary;;   BRONCHIAL NEEDLE ASPIRATION BIOPSY  07/26/2020   Procedure: BRONCHIAL NEEDLE ASPIRATION BIOPSIES;  Surgeon: Garner Nash, DO;  Location: [place];  Service: Pulmonary;;   CARDIAC CATHETERIZATION   10/29/2012   COLONOSCOPY WITH PROPOFOL N/A 11/24/2019   Procedure: COLONOSCOPY WITH PROPOFOL;  Surgeon: Irving Copas., MD;  Location: [place];  Service: Gastroenterology;  Laterality: N/A;   CORONARY ANGIOPLASTY WITH STENT PLACEMENT  11/05/2012   DIRECT LARYNGOSCOPY N/A 01/02/2019   Procedure: MICRO DIRECT LARYNGOSCOPY BIOPSY OF LARYNGEAL MASS;  Surgeon: [name], MD;  Location: [place];  Service: ENT;  Laterality: N/A;   ESOPHAGOGASTRODUODENOSCOPY (EGD) WITH PROPOFOL N/A 08/11/2019   Procedure: ATTEMPTED ESOPHAGOGASTRODUODENOSCOPY (EGD) WITH PROPOFOL (UNABLE TO PERFORM PERCUTANEOUS ENDOSCOPIC GASTROSTOMY TUBE REPLACEMENT);  Surgeon: [name], MD;  [facility]: [facility];  Service: Gastroenterology;  Laterality: N/A;   ESOPHAGOGASTRODUODENOSCOPY (EGD) WITH PROPOFOL N/A 11/24/2019   Procedure: ESOPHAGOGASTRODUODENOSCOPY (EGD) WITH PROPOFOL;  Surgeon: Rush Landmark Telford Nab., MD;  Location: [place];  Service: Gastroenterology;  Laterality: N/A;   ESOPHAGOGASTRODUODENOSCOPY (EGD) WITH PROPOFOL N/A 01/26/2020   Procedure: ESOPHAGOGASTRODUODENOSCOPY (EGD) WITH PROPOFOL;  Surgeon: Rush Landmark Telford Nab., MD;  Location: [place];  Service: Gastroenterology;  Laterality: N/A;   ESOPHAGOGASTRODUODENOSCOPY (EGD) WITH PROPOFOL N/A 03/29/2020   Procedure: ESOPHAGOGASTRODUODENOSCOPY (EGD) WITH PROPOFOL;  Surgeon: Rush Landmark Telford Nab., MD;  Location: [facility];  Service: Gastroenterology;  Laterality: N/A;  NEEDS FLOURO   FIDUCIAL MARKER PLACEMENT  07/26/2020   Procedure: FIDUCIAL MARKER PLACEMENT;  Surgeon: Garner Nash, DO;  Location: [place] [facility];  Service: Pulmonary;;   HEMORRHOID SURGERY  ~ 2010   INSERTION OF MESH N/A 06/02/2015   Procedure: INSERTION OF MESH;  Surgeon: [name], MD;  [facility];  Service: General;  Laterality: N/A;   IR ANGIO INTRA EXTRACRAN SEL COM CAROTID INNOMINATE BILAT MOD SED  08/02/2018   IR ANGIO VERTEBRAL SEL VERTEBRAL BILAT MOD SED  08/02/2018    IR REPLACE G-TUBE SIMPLE WO FLUORO  08/12/2019   IR [place] GASTRO/COLONIC TUBE PERCUT W/FLUORO  08/13/2019   LAPAROSCOPIC INSERTION GASTROSTOMY TUBE N/A 02/24/2019   Procedure: LAPAROSCOPIC INSERTION GASTROSTOMY TUBE;  Surgeon: [name], MD;  Location: [place];  Service: General;  Laterality: N/A;   LEFT HEART CATHETERIZATION WITH CORONARY ANGIOGRAM N/A 10/30/2012   Procedure: LEFT HEART CATHETERIZATION WITH CORONARY ANGIOGRAM;  Surgeon: [name], MD;  Location: [facility];  Service: Cardiovascular;  Laterality: N/A;   MULTIPLE EXTRACTIONS WITH ALVEOLOPLASTY N/A 02/18/2019   Procedure: Extraction of tooth #'s 5-7, 10,11,14,20-29, 31 and 32 with alveoloplasty and maxiillary left buccal exostoses reductions;  Surgeon: [name], [name];  Location: [place];  Service: Oral Surgery;  Laterality: N/A;   PEG PLACEMENT N/A 01/26/2020   Procedure: PERCUTANEOUS ENDOSCOPIC GASTROSTOMY (PEG) REPLACEMENT;  Surgeon: Irving Copas., MD;  Location: [place];  Service: Gastroenterology;  Laterality: N/A;   PERCUTANEOUS CORONARY ROTOBLATOR INTERVENTION (PCI-R) N/A 11/05/2012   Procedure: PERCUTANEOUS CORONARY ROTOBLATOR INTERVENTION (PCI-R);  Surgeon: [name], MD;  [facility];  Service: Cardiovascular;  Laterality: N/A;   SAVORY DILATION N/A 11/24/2019   Procedure: SAVORY DILATION;  Surgeon: Rush Landmark Telford Nab., MD;  Location: [place];  Service: Gastroenterology;  Laterality: N/A;   SAVORY DILATION N/A 01/26/2020   Procedure: SAVORY DILATION;  Surgeon: Rush Landmark Telford Nab., MD;  Location: [place];  Service: Gastroenterology;  Laterality: N/A;   TRACHEOSTOMY TUBE PLACEMENT N/A 02/18/2019   Procedure: Tracheostomy;  Surgeon: [name], MD;  Location: [place];  Service: ENT;  Laterality: N/A;   UMBILICAL HERNIA REPAIR N/A 06/02/2015   Procedure: UMBILICAL HERNIORRHAPHY WITH MESH;  Surgeon: [name], MD;  [facility];  Service: General;  Laterality: N/A;    VIDEO BRONCHOSCOPY WITH ENDOBRONCHIAL NAVIGATION Bilateral 07/26/2020   Procedure: VIDEO BRONCHOSCOPY WITH ENDOBRONCHIAL NAVIGATION;  Surgeon: Garner Nash, DO;  Location: [place];  Service: Pulmonary;  Laterality: Bilateral;  ION   VIDEO BRONCHOSCOPY WITH RADIAL ENDOBRONCHIAL ULTRASOUND  07/26/2020   Procedure: VIDEO BRONCHOSCOPY WITH RADIAL ENDOBRONCHIAL ULTRASOUND;  Surgeon: Garner Nash, DO;  [facility];  Service: Pulmonary;;    Family History  Problem Relation Age of Onset   Hypertension Mother    Heart disease Father    Hypertension Father    Heart disease Sister    Hypertension Maternal Grandmother    Hypertension Maternal Grandfather    Hypertension Paternal Grandmother    Hypertension Paternal Grandfather    Pancreatic cancer Maternal Uncle    Breast cancer Maternal Aunt    Breast cancer Maternal Aunt    Diabetes Paternal Uncle    Diabetes Paternal Aunt     Social History:  reports that he quit smoking about 14 years ago. His smoking use included cigarettes. He started smoking about 44 years ago. He has a 96.00 pack-year smoking history. He quit smokeless tobacco use about 20 years ago.  His smokeless tobacco use included chew. He reports that he does not currently use alcohol after a past usage of about 12.0 standard drinks of alcohol per week. He reports that he does not currently use drugs after having used the following drugs: Marijuana.  Allergies:  Allergies  Allergen Reactions  Cymbalta [Duloxetine Hcl]    Duloxetine Other (See Comments)    Caused depression/ patient is taking   Prednisone Other (See Comments)    Chest pain    Medications: Prior to Admission:  Medications Prior to Admission  Medication Sig Dispense Refill Last Dose   acetaminophen (TYLENOL) 325 MG tablet Take 1.5 tablets (487.5 mg total) by mouth every 6 (six) hours as needed for mild pain, fever or headache (or Fever >/= 101).   09/06/2021   albuterol (VENTOLIN HFA) 108  (90 Base) MCG/ACT inhaler Inhale 1-2 puffs into the lungs every 4 (four) hours as needed for wheezing or shortness of breath.   Past Week   amLODipine (NORVASC) 5 MG tablet Take 1 tablet (5 mg total) by mouth daily. 30 tablet 0 09/07/2021   aspirin EC 81 MG tablet Take 1 tablet (81 mg total) by mouth daily with breakfast. 30 tablet 4 09/07/2021   atorvastatin (LIPITOR) 40 MG tablet Take 40 mg by mouth at bedtime.   09/06/2021   Baclofen 5 MG TABS Take 10 mg by mouth at bedtime. (Patient taking differently: Take 5 mg by mouth at bedtime.) 60 tablet 3 09/06/2021   diazepam (VALIUM) 10 MG tablet Take 5-10 mg by mouth in the morning and at bedtime. 5mg  in the morning, and 10mg  at night, 1 extra tab if needed   09/07/2021   docusate sodium (COLACE) 100 MG capsule Take 100 mg by mouth 2 (two) times daily.   09/06/2021   DULoxetine (CYMBALTA) 30 MG capsule Take 30 mg by mouth daily.   09/07/2021   HYDROcodone-acetaminophen (NORCO) 10-325 MG tablet Take 1 tablet by mouth every 4 (four) hours as needed. (Patient taking differently: Take 1 tablet by mouth every 4 (four) hours as needed for moderate pain.) 50 tablet 0 Past Week   lactulose (CHRONULAC) 10 GM/15ML solution TAKE 30 MLS( 20 GIVE) BY MOUTH FOUR TIMES DAILY AS NEEDED (Patient taking differently: Take 20 g by mouth 4 (four) times daily as needed for mild constipation.) 473 mL 2 UNK   levothyroxine (SYNTHROID) 50 MCG tablet Take 50 mcg by mouth daily.   09/07/2021   metoprolol tartrate (LOPRESSOR) 25 MG tablet Take 12.5 mg by mouth 2 (two) times daily.   09/07/2021 at 1000   omeprazole (PRILOSEC) 40 MG capsule TAKE 1 CAPSULE(40 MG) BY MOUTH DAILY (Patient taking differently: Take 40 mg by mouth daily.) 90 capsule 0 09/07/2021   potassium chloride SA (KLOR-CON) 20 MEQ tablet Take 20 mEq by mouth daily.   09/07/2021   valproic acid (DEPAKENE) 250 MG/5ML solution TAKE 10 ML BY MOUTH THREE TIMES DAILY (Patient taking differently: Take 500 mg by mouth 3 (three) times daily.) 900  mL 5 09/07/2021   dronabinol (MARINOL) 5 MG capsule TAKE 1 CAPSULE(5 MG) BY MOUTH TWICE DAILY BEFORE A MEAL (Patient not taking: Reported on 09/07/2021) 60 capsule 3 Not Taking   nitroGLYCERIN (NITROSTAT) 0.4 MG SL tablet Place 1 tablet (0.4 mg total) under the tongue every 5 (five) minutes as needed for chest pain (CP or SOB). 25 tablet 3 UNK    Results for orders placed or performed during the hospital encounter of 09/07/21 (from the past 48 hour(s))  CBC     Status: Abnormal   Collection Time: 09/07/21 11:52 AM  Result Value Ref Range   WBC 10.7 (H) 4.0 - 10.5 K/uL   RBC 4.20 (L) 4.22 - 5.81 MIL/uL   Hemoglobin 12.7 (L) 13.0 - 17.0 g/dL   HCT 38.3 (  L) 39.0 - 52.0 %   MCV 91.2 80.0 - 100.0 fL   MCH 30.2 26.0 - 34.0 pg   MCHC 33.2 30.0 - 36.0 g/dL   RDW 13.7 11.5 - 15.5 %   Platelets 164 150 - 400 K/uL   nRBC 0.0 0.0 - 0.2 %    Comment: Performed at [facility], [address], [place], [place] [address]  Basic metabolic panel     Status: Abnormal   Collection Time: 09/07/21 11:52 AM  Result Value Ref Range   Sodium 129 (L) 135 - 145 mmol/L   Potassium 4.0 3.5 - 5.1 mmol/L   Chloride 92 (L) 98 - 111 mmol/L   CO2 29 22 - 32 mmol/L   Glucose, Bld 127 (H) 70 - 99 mg/dL    Comment: Glucose reference range applies only to samples taken after fasting for at least 8 hours.   BUN 11 6 - 20 mg/dL   Creatinine, Ser 0.88 0.61 - 1.24 mg/dL   Calcium 8.6 (L) 8.9 - 10.3 mg/dL   GFR, Estimated >60 >60 mL/min    Comment: (NOTE) Calculated using the CKD-EPI Creatinine Equation (2021)    Anion gap 8 5 - 15    Comment: Performed at [facility], [address] [place], [place] [address]  HIV Antibody (routine testing w rflx)     Status: None   Collection Time: 09/07/21  2:25 PM  Result Value Ref Range   HIV Screen 4th Generation wRfx Non Reactive Non Reactive    Comment: Performed at [facility], [place] [address] [place], [place] [address]  TSH     Status: None   Collection Time: 09/07/21   2:25 PM  Result Value Ref Range   TSH 2.121 0.350 - 4.500 uIU/mL    Comment: Performed by a 3rd Generation assay with a functional sensitivity of <=0.01 uIU/mL. Performed at [facility], [address], [place], [place] [address]   T4, free     Status: None   Collection Time: 09/07/21  2:25 PM  Result Value Ref Range   Free T4 0.96 0.61 - 1.12 ng/dL    Comment: (NOTE) Biotin ingestion may interfere with free T4 tests. If the results are inconsistent with the TSH level, previous test results, or the clinical presentation, then consider biotin interference. If needed, order repeat testing after stopping biotin. Performed at [facility], [place] [address] [place], [place] [address]   Surgical pcr screen     Status: None   Collection Time: 09/07/21 11:23 PM   Specimen: Nasal Mucosa; Nasal Swab  Result Value Ref Range   MRSA, PCR NEGATIVE NEGATIVE   Staphylococcus aureus NEGATIVE NEGATIVE    Comment: (NOTE) The Xpert SA Assay (FDA approved for NASAL specimens in patients 49 years of age and older), is one component of a comprehensive surveillance program. It is not intended to diagnose infection nor to guide or monitor treatment. Performed at [facility], [place] [address] [place], [place] [address]   Magnesium     Status: None   Collection Time: 09/08/21 12:37 AM  Result Value Ref Range   Magnesium 1.7 1.7 - 2.4 mg/dL    Comment: Performed at [place] [address] [place],  [address]  Basic metabolic panel     Status: Abnormal   Collection Time: 09/08/21 12:37 AM  Result Value Ref Range   Sodium 130 (L) 135 - 145 mmol/L   Potassium 3.8 3.5 -  5.1 mmol/L   Chloride 98 98 - 111 mmol/L   CO2 25 22 - 32 mmol/L   Glucose, Bld 120 (H) 70 - 99 mg/dL    Comment: Glucose reference range applies only to samples taken after fasting for at least 8 hours.   BUN 7 6 - 20 mg/dL   Creatinine, Ser 0.77 0.61 - 1.24 mg/dL   Calcium 8.3 (L) 8.9 - 10.3 mg/dL    GFR, Estimated >60 >60 mL/min    Comment: (NOTE) Calculated using the CKD-EPI Creatinine Equation (2021)    Anion gap 7 5 - 15    Comment: Performed at [place] [address], [place], [place] [address]  CBC     Status: Abnormal   Collection Time: 09/08/21 12:37 AM  Result Value Ref Range   WBC 7.9 4.0 - 10.5 K/uL   RBC 3.52 (L) 4.22 - 5.81 MIL/uL   Hemoglobin 10.8 (L) 13.0 - 17.0 g/dL   HCT 31.3 (L) 39.0 - 52.0 %   MCV 88.9 80.0 - 100.0 fL   MCH 30.7 26.0 - 34.0 pg   MCHC 34.5 30.0 - 36.0 g/dL   RDW 13.8 11.5 - 15.5 %   Platelets 172 150 - 400 K/uL   nRBC 0.0 0.0 - 0.2 %    Comment: Performed at [facility], [place] [address], [place], [place] [address]    DG Knee Complete 4 Views Left  Result Date: 09/07/2021 CLINICAL DATA:  Fall EXAM: LEFT KNEE - COMPLETE 4+ VIEW COMPARISON:  None Available. FINDINGS: There is no acute fracture or dislocation. Alignment is normal. The joint spaces are preserved. There is no erosive change. There is no effusion. Dense vascular calcifications are noted. IMPRESSION: No acute fracture or dislocation. Electronically Signed   By: Valetta Mole M.D.   On: 09/07/2021 11:42   DG Hip Unilat W or Wo Pelvis 2-3 Views Left  Result Date: 09/07/2021 CLINICAL DATA:  Fall.  Hip pain. EXAM: DG HIP (WITH OR WITHOUT PELVIS) 2-3V LEFT COMPARISON:  None Available. FINDINGS: Nondisplaced, intertrochanteric fracture of the left proximal femur, with no significant comminution, apex anterior angulation but no significant valgus or varus angulation. No other fractures.  No bone lesions. Hip joints, SI joints and symphysis pubis are normally aligned. Dense atherosclerotic vascular calcifications. IMPRESSION: 1. Nondisplaced left proximal femur intertrochanteric fracture, without significant comminution. Apex anterior angulation. Electronically Signed   By: Lajean Manes M.D.   On: 09/07/2021 11:39    Intake/Output      09/06 0701 09/07 0700 09/07 0701 09/08 0700    P.O. 720    I.V. (mL/kg) 446.5 (6)    Total Intake(mL/kg) 1166.5 (15.7)    Urine (mL/kg/hr) 1150    Total Output 1150    Net +16.5            ROS as above for multiple systems; admits to irregular bowel movements but believes resolves with mineral water Blood pressure (!) 156/85, pulse 71, temperature 98.2 F (36.8 C), resp. rate 19, height 6\' 1"  (1.854 m), weight 74.3 kg, SpO2 92 %. Physical Exam NCAT, very pleasant, appears older than stated age; cachectic build No lung retractions with breathing Abd soft, nondistended LLE Tender hip, shortened and slightly rotated  Edema/ swelling controlled  Sens: DPN, SPN, TN intact  Motor: EHL, FHL, and lessor toe ext and flex all intact grossly  Brisk cap refill, warm to touch Gait: could not observe Coordination and balance: could not observe  Assessment/Plan:  Left intertrochanteric fracture for IM nailing today under spinal; discussed with anesthesia Apparent constipation on abdominal film CAD COPD H/o stroke Cancer  Constipation  I discussed with the patient the risks and benefits of surgery, including the possibility of infection, nerve injury, vessel injury, wound breakdown, arthritis, symptomatic hardware, DVT/ PE, loss of motion, malunion, nonunion, and need for further surgery among others.  We also specifically discussed the elevated risks because of his vascular disease and cancer history.  He acknowledged these risks and wished to proceed.  Weightbearing: WBAT LLE Insicional and dressing care: Reinforce dressings as needed Orthopedic device(s): none Showering: for sure VTE prophylaxis: Xarelto 15mg   6 weeks Pain control: Norco Follow - up plan: 2 weeks Contact information:  [place] MD, Ainsley Spinner, PA-C   Altamese Stuart, MD [facility], [facility] [phone]  09/08/2021, 8:06 AM  [facility] [place] [phone] Jenetta [phone]  (F)    After 5pm and on the weekends please log on to Amion, go to orthopaedics and the look under the [facility] Call for the provider(s) on call. You can also call our office at [phone] and then follow the prompts to be connected to the call team.

## 2021-09-08 NOTE — Progress Notes (Signed)
PROGRESS NOTE    [name]  JOI:[id number] DOB: 1960-05-25 DOA: 09/07/2021 PCP: [name], MD   Brief Narrative: JAKHARI [name] is a 61 y.o. male with medical history significant for CAD, prior CVA, hypertension, dyslipidemia, prior supraglottic squamous cell carcinoma, recurrent squamous cell lung cancer, and prior tobacco and alcohol use who presented to the ED with acute onset left hip pain and knee pain after a fall from his truck earlier this morning.  He denies any loss of consciousness or dizziness prior to the episode and states that he simply twisted around his cane getting out of his truck which led to the fall.  He denies any head injury and was not able to ambulate secondary to the pain.  He denies any numbness, tingling, back pain, neck pain, chest pain, shortness of breath, abdominal pain, nausea, or vomiting.  No fevers or chills noted.   ED Course: Stable vital signs noted with mild leukocytosis of 10,700.  Hemoglobin 12.7 and sodium 129.  Left proximal, nondisplaced femur fracture noted and EDP has discussed with orthopedics Dr. Amedeo Kinsman regarding the need for operative intervention.    Assessment & Plan:   Principal Problem:   Hip fracture ([place]) Active Problems:   Hypertension   Hyponatremia   Coronary artery disease   Stroke [facility])   Head and neck cancer (HCC)   #1 status post fall with acute left proximal femur fracture status post intramedullary nailing of the left hip 09/08/2021 Dr. Marcelino Scot  #2 history of essential hypertension blood pressure stable on Norvasc and Lopressor  #3 hyponatremia sodium improved 130 TSH 2.1  #4 hyperlipidemia on Lipitor  #5 hypothyroidism continue Synthroid normal TSH  #6 history of stroke on aspirin and statin  #7 history of anxiety depression on Cymbalta and Valium  #8 seizure disorder on valproic acid  #9 squamous cell lung CA  #10 history of supraglottic cancer seen by speech therapy patient is mild aspiration risk on  dysphagia 1 diet  #11 GERD on PPI  #12 acute blood loss anemia.  Hemoglobin 10.8 from 12.7 will monitor.  Estimated body mass index is 21.61 kg/m as calculated from the following:   Height as of this encounter: 6\' 1"  (1.854 m).   Weight as of this encounter: 74.3 kg.  DVT prophylaxis: Lovenox starting 09/09/2021  code Status: Full code  family Communication: None at bedside Disposition Plan:  Status is: Inpatient Remains inpatient appropriate because: Hip fracture   Consultants:  Ortho  Procedures: ORIF Antimicrobials: None  Subjective:  sleepy after the procedure  Objective: Vitals:   09/08/21 1130 09/08/21 1211 09/08/21 1212 09/08/21 1243  BP: 125/69 127/76 127/76   Pulse: 64 78 78 82  Resp: 11 13 13    Temp: 98.2 F (36.8 C) 98.3 F (36.8 C)    TempSrc:  Oral    SpO2: 93% 94% 94%   Weight:      Height:        Intake/Output Summary (Last 24 hours) at 09/08/2021 1532 Last data filed at 09/08/2021 1018 Gross per 24 hour  Intake 3016.46 ml  Output 1050 ml  Net 1966.46 ml   Filed Weights   09/07/21 1048 09/07/21 2129  Weight: 74.4 kg 74.3 kg    Examination:  General exam: Appears calm and comfortable  Respiratory system: Clear to auscultation. Respiratory effort normal. Cardiovascular system: S1 & S2 heard, RRR. No JVD, murmurs, rubs, gallops or clicks. No pedal edema. Gastrointestinal system: Abdomen is nondistended, soft and nontender. No organomegaly  or masses felt. Normal bowel sounds heard. Central nervous system: Alert and oriented. No focal neurological deficits. Extremities: Left hip incision covered with dressing. Skin: No rashes, lesions or ulcers Psychiatry: Judgement and insight appear normal. Mood & affect appropriate.     Data Reviewed: I have personally reviewed following labs and imaging studies  CBC: Recent Labs  Lab 09/07/21 1152 09/08/21 0037  WBC 10.7* 7.9  HGB 12.7* 10.8*  HCT 38.3* 31.3*  MCV 91.2 88.9  PLT 164 622   Basic  Metabolic Panel: Recent Labs  Lab 09/07/21 1152 09/08/21 0037  NA 129* 130*  K 4.0 3.8  CL 92* 98  CO2 29 25  GLUCOSE 127* 120*  BUN 11 7  CREATININE 0.88 0.77  CALCIUM 8.6* 8.3*  MG  --  1.7   GFR: Estimated Creatinine Clearance: 103.2 mL/min (by C-G formula based on SCr of 0.77 mg/dL). Liver Function Tests: No results for input(s): "AST", "ALT", "ALKPHOS", "BILITOT", "PROT", "ALBUMIN" in the last 168 hours. No results for input(s): "LIPASE", "AMYLASE" in the last 168 hours. No results for input(s): "AMMONIA" in the last 168 hours. Coagulation Profile: No results for input(s): "INR", "PROTIME" in the last 168 hours. Cardiac Enzymes: No results for input(s): "CKTOTAL", "CKMB", "CKMBINDEX", "TROPONINI" in the last 168 hours. BNP (last 3 results) No results for input(s): "PROBNP" in the last 8760 hours. HbA1C: No results for input(s): "HGBA1C" in the last 72 hours. CBG: No results for input(s): "GLUCAP" in the last 168 hours. Lipid Profile: No results for input(s): "CHOL", "HDL", "LDLCALC", "TRIG", "CHOLHDL", "LDLDIRECT" in the last 72 hours. Thyroid Function Tests: Recent Labs    09/07/21 1425  TSH 2.121  FREET4 0.96   Anemia Panel: No results for input(s): "VITAMINB12", "FOLATE", "FERRITIN", "TIBC", "IRON", "RETICCTPCT" in the last 72 hours. Sepsis Labs: No results for input(s): "PROCALCITON", "LATICACIDVEN" in the last 168 hours.  Recent Results (from the past 240 hour(s))  Surgical pcr screen     Status: None   Collection Time: 09/07/21 11:23 PM   Specimen: Nasal Mucosa; Nasal Swab  Result Value Ref Range Status   MRSA, PCR NEGATIVE NEGATIVE Final   Staphylococcus aureus NEGATIVE NEGATIVE Final    Comment: (NOTE) The Xpert SA Assay (FDA approved for NASAL specimens in patients 88 years of age and older), is one component of a comprehensive surveillance program. It is not intended to diagnose infection nor to guide or monitor treatment. Performed at [facility], [place] [address] [place], [place] [address]          Radiology Studies: DG FEMUR PORT MIN 2 VIEWS LEFT  Result Date: 09/08/2021 CLINICAL DATA:  Postop left femur EXAM: LEFT FEMUR PORTABLE 2 VIEWS COMPARISON:  Left hip radiographs 09/07/2021 FINDINGS: Interval left intramedullary nail fixation of the previously seen left intertrochanteric fracture with interlocking femoral neck screw and transverse distal interlocking screw. Normal alignment of the dominant and trochanteric fracture line. Mild superior displacement of the fractured lesser trochanter is again seen. No perihardware lucency is seen to indicate hardware failure or loosening. Mild-to-moderate superolateral left acetabular degenerative osteophytosis. Mild medial compartment of the knee joint [name] narrowing. No knee joint effusion. Moderate to high-grade vascular calcifications. IMPRESSION: Interval ORIF of the left femoral intertrochanteric fracture with improved alignment. No evidence of hardware failure. Electronically Signed   By: Yvonne Kendall M.D.   On: 09/08/2021 11:32   DG FEMUR MIN 2 VIEWS LEFT  Result Date: 09/08/2021 CLINICAL DATA:  Left femur ORIF EXAM: LEFT  FEMUR 2 VIEWS COMPARISON:  09/07/2021 FINDINGS: Six C-arm fluoroscopic images were obtained intraoperatively and submitted for post operative interpretation. Images demonstrate placement of ORIF hardware traversing left intertrochanteric femur fracture. 1 minute 15 seconds fluoroscopy time utilized. Radiation dose: 16.38 mGy. Please see the performing provider's procedural report for further detail. IMPRESSION: Intraoperative ORIF left femur fracture. Electronically Signed   By: Davina Poke D.O.   On: 09/08/2021 10:53   DG C-Arm 1-60 Min-No Report  Result Date: 09/08/2021 Fluoroscopy was utilized by the requesting physician.  No radiographic interpretation.   DG C-Arm 1-60 Min-No Report  Result Date: 09/08/2021 Fluoroscopy was utilized by the requesting  physician.  No radiographic interpretation.   DG Knee Complete 4 Views Left  Result Date: 09/07/2021 CLINICAL DATA:  Fall EXAM: LEFT KNEE - COMPLETE 4+ VIEW COMPARISON:  None Available. FINDINGS: There is no acute fracture or dislocation. Alignment is normal. The joint spaces are preserved. There is no erosive change. There is no effusion. Dense vascular calcifications are noted. IMPRESSION: No acute fracture or dislocation. Electronically Signed   By: Valetta Mole M.D.   On: 09/07/2021 11:42   DG Hip Unilat W or Wo Pelvis 2-3 Views Left  Result Date: 09/07/2021 CLINICAL DATA:  Fall.  Hip pain. EXAM: DG HIP (WITH OR WITHOUT PELVIS) 2-3V LEFT COMPARISON:  None Available. FINDINGS: Nondisplaced, intertrochanteric fracture of the left proximal femur, with no significant comminution, apex anterior angulation but no significant valgus or varus angulation. No other fractures.  No bone lesions. Hip joints, SI joints and symphysis pubis are normally aligned. Dense atherosclerotic vascular calcifications. IMPRESSION: 1. Nondisplaced left proximal femur intertrochanteric fracture, without significant comminution. Apex anterior angulation. Electronically Signed   By: Lajean Manes M.D.   On: 09/07/2021 11:39        Scheduled Meds:  acetaminophen  500 mg Oral Q12H   amLODipine  5 mg Oral Daily   aspirin EC  81 mg Oral Q breakfast   atorvastatin  40 mg Oral QHS   baclofen  5 mg Oral QHS   diazepam  10 mg Oral QHS   diazepam  5 mg Oral Daily   docusate sodium  100 mg Oral BID   DULoxetine  30 mg Oral Daily   [START ON 09/09/2021] enoxaparin (LOVENOX) injection  40 mg Subcutaneous Q24H   levothyroxine  50 mcg Oral Daily   metoprolol tartrate  12.5 mg Oral BID   pantoprazole  40 mg Oral Daily   valproic acid  500 mg Oral TID   Continuous Infusions:   ceFAZolin (ANCEF) IV 2 g (09/08/21 1301)     LOS: 1 day    Time spent: 38 min  Georgette Shell, MD 09/08/2021, 3:32 PM

## 2021-09-09 ENCOUNTER — Encounter (HOSPITAL_COMMUNITY): Payer: Self-pay | Admitting: Orthopedic Surgery

## 2021-09-09 DIAGNOSIS — S72002A Fracture of unspecified part of neck of left femur, initial encounter for closed fracture: Secondary | ICD-10-CM | POA: Diagnosis not present

## 2021-09-09 LAB — CBC
HCT: 28.6 % — ABNORMAL LOW (ref 39.0–52.0)
Hemoglobin: 9.6 g/dL — ABNORMAL LOW (ref 13.0–17.0)
MCH: 30.6 pg (ref 26.0–34.0)
MCHC: 33.6 g/dL (ref 30.0–36.0)
MCV: 91.1 fL (ref 80.0–100.0)
Platelets: 135 10*3/uL — ABNORMAL LOW (ref 150–400)
RBC: 3.14 MIL/uL — ABNORMAL LOW (ref 4.22–5.81)
RDW: 13.9 % (ref 11.5–15.5)
WBC: 9 10*3/uL (ref 4.0–10.5)
nRBC: 0 % (ref 0.0–0.2)

## 2021-09-09 LAB — BASIC METABOLIC PANEL
Anion gap: 12 (ref 5–15)
BUN: 9 mg/dL (ref 6–20)
CO2: 27 mmol/L (ref 22–32)
Calcium: 9 mg/dL (ref 8.9–10.3)
Chloride: 95 mmol/L — ABNORMAL LOW (ref 98–111)
Creatinine, Ser: 0.89 mg/dL (ref 0.61–1.24)
GFR, Estimated: >60 mL/min (ref >60)
Glucose, Bld: 98 mg/dL (ref 70–99)
Potassium: 3.8 mmol/L (ref 3.5–5.1)
Sodium: 134 mmol/L — ABNORMAL LOW (ref 135–145)

## 2021-09-09 LAB — VITAMIN D 25 HYDROXY (VIT D DEFICIENCY, FRACTURES): Vit D, 25-Hydroxy: 35.02 ng/mL (ref 30–100)

## 2021-09-09 MED ORDER — BISACODYL 10 MG RE SUPP: 10.0000 mg | Freq: Every day | RECTAL | Status: DC | PRN

## 2021-09-09 MED ORDER — PANTOPRAZOLE 2 MG/ML SUSPENSION
40.0000 mg | Freq: Every day | ORAL | Status: DC
  Administered 2021-09-10 – 2021-09-13 (×4): 40 mg via ORAL
  Filled 2021-09-09 (×5): qty 20

## 2021-09-09 MED ORDER — SENNA 8.6 MG PO TABS
1.0000 | ORAL_TABLET | Freq: Every day | ORAL | Status: DC
  Administered 2021-09-09: 8.6 mg via ORAL
  Filled 2021-09-09: qty 1

## 2021-09-09 MED ORDER — BACLOFEN 1 MG/ML ORAL SUSPENSION
5.0000 mg | Freq: Every day | ORAL | Status: DC
  Administered 2021-09-10 – 2021-09-12 (×3): 5 mg via ORAL
  Filled 2021-09-09 (×6): qty 5

## 2021-09-09 MED ORDER — SENNOSIDES 8.8 MG/5ML PO SYRP
5.0000 mL | ORAL_SOLUTION | Freq: Every day | ORAL | Status: DC
  Administered 2021-09-10 – 2021-09-12 (×2): 5 mL via ORAL
  Filled 2021-09-09 (×5): qty 5

## 2021-09-09 MED ORDER — POLYETHYLENE GLYCOL 3350 17 G PO PACK
17.0000 g | PACK | Freq: Every day | ORAL | Status: DC
  Administered 2021-09-09 – 2021-09-10 (×2): 17 g via ORAL
  Filled 2021-09-09 (×3): qty 1

## 2021-09-09 MED ORDER — METOPROLOL TARTRATE 25 MG/10 ML ORAL SUSPENSION
12.5000 mg | Freq: Two times a day (BID) | ORAL | Status: DC
  Administered 2021-09-10 – 2021-09-11 (×3): 12.5 mg via ORAL
  Filled 2021-09-09 (×4): qty 5

## 2021-09-09 MED ORDER — ACETAMINOPHEN 160 MG/5ML PO SOLN
500.0000 mg | Freq: Two times a day (BID) | ORAL | Status: DC
  Administered 2021-09-10 – 2021-09-13 (×7): 500 mg via ORAL
  Filled 2021-09-09 (×7): qty 20.3

## 2021-09-09 MED ORDER — DOCUSATE SODIUM 50 MG/5ML PO LIQD
100.0000 mg | Freq: Two times a day (BID) | ORAL | Status: DC
  Administered 2021-09-10 (×2): 100 mg via ORAL
  Filled 2021-09-09 (×3): qty 10

## 2021-09-09 MED ORDER — ACETAMINOPHEN 160 MG/5ML PO SOLN: 325.0000 mg | Freq: Four times a day (QID) | ORAL | Status: DC | PRN

## 2021-09-09 MED ORDER — HYDROCODONE-ACETAMINOPHEN 7.5-325 MG/15ML PO SOLN
10.0000 mL | Freq: Four times a day (QID) | ORAL | Status: DC | PRN
  Administered 2021-09-10: 20 mL via ORAL
  Filled 2021-09-09: qty 30
  Filled 2021-09-09: qty 15

## 2021-09-09 MED ORDER — ASPIRIN 81 MG PO CHEW
81.0000 mg | CHEWABLE_TABLET | Freq: Every day | ORAL | Status: DC
  Administered 2021-09-10 – 2021-09-13 (×4): 81 mg via ORAL
  Filled 2021-09-09 (×4): qty 1

## 2021-09-09 NOTE — Plan of Care (Signed)

## 2021-09-09 NOTE — Progress Notes (Signed)
PROGRESS NOTE    [name]  YTR:[id number] DOB: 03/03/1960 DOA: 09/07/2021 PCP: [name], MD   Brief Narrative: [name] is a 61 y.o. male with medical history significant for CAD, prior CVA, hypertension, dyslipidemia, prior supraglottic squamous cell carcinoma, recurrent squamous cell lung cancer, and prior tobacco and alcohol use who presented to the ED with acute onset left hip pain and knee pain after a fall from his truck earlier this morning.  He denies any loss of consciousness or dizziness prior to the episode and states that he simply twisted around his cane getting out of his truck which led to the fall.  He denies any head injury and was not able to ambulate secondary to the pain.  He denies any numbness, tingling, back pain, neck pain, chest pain, shortness of breath, abdominal pain, nausea, or vomiting.  No fevers or chills noted.   ED Course: Stable vital signs noted with mild leukocytosis of 10,700.  Hemoglobin 12.7 and sodium 129.  Left proximal, nondisplaced femur fracture noted and EDP has discussed with orthopedics Dr. Amedeo Kinsman regarding the need for operative intervention.    Assessment & Plan:   Principal Problem:   Hip fracture ([place]) Active Problems:   Hypertension   Hyponatremia   Coronary artery disease   Stroke [facility])   Head and neck cancer (HCC)   #1 status post fall with acute left proximal femur fracture status post intramedullary nailing of the left hip 09/08/2021 Dr. Marcelino Scot Lovenox while in hospital and Eliquis on discharge for DVT prophylaxis  #2 history of essential hypertension blood pressure 176/91.  Continue Norvasc and Lopressor.   #3 hyponatremia sodium improved 134 TSH 2.1  #4 hyperlipidemia on Lipitor  #5 hypothyroidism continue Synthroid normal TSH  #6 history of stroke on aspirin and statin  #7 history of anxiety depression on Cymbalta and Valium  #8 seizure disorder on valproic acid  #9 squamous cell lung CA  #10 history  of supraglottic cancer seen by speech therapy patient is mild aspiration risk on dysphagia 1 diet  #11 GERD on PPI  #12 acute blood loss anemia.  Hemoglobin 10.8 from 12.7 will monitor.  Estimated body mass index is 21.61 kg/m as calculated from the following:   Height as of this encounter: 6\' 1"  (1.854 m).   Weight as of this encounter: 74.3 kg.  DVT prophylaxis: Lovenox starting 09/09/2021  code Status: Full code  family Communication: None at bedside Disposition Plan:  Status is: Inpatient Remains inpatient appropriate because: Hip fracture   Consultants:  Ortho  Procedures: ORIF Antimicrobials: None  Subjective:  Seen trying to work with therapy  Objective: Vitals:   09/08/21 1932 09/08/21 2028 09/09/21 0426 09/09/21 0814  BP: 100/66 122/81 121/71 (!) 176/91  Pulse: 72 75 73 92  Resp: 11 15 15 16   Temp: 98.6 F (37 C) 98.1 F (36.7 C) 97.9 F (36.6 C)   TempSrc: Oral Oral Oral   SpO2: 93% 92% 96% 92%  Weight:      Height:        Intake/Output Summary (Last 24 hours) at 09/09/2021 1342 Last data filed at 09/09/2021 0802 Gross per 24 hour  Intake 100 ml  Output 1200 ml  Net -1100 ml    Filed Weights   09/07/21 1048 09/07/21 2129  Weight: 74.4 kg 74.3 kg    Examination:  General exam: Appears calm and comfortable  Respiratory system: Clear to auscultation. Respiratory effort normal. Cardiovascular system: S1 & S2 heard, RRR.  No JVD, murmurs, rubs, gallops or clicks. No pedal edema. Gastrointestinal system: Abdomen is nondistended, soft and nontender. No organomegaly or masses felt. Normal bowel sounds heard. Central nervous system: Alert and oriented. No focal neurological deficits. Extremities: Left hip incision covered with dressing. Skin: No rashes, lesions or ulcers Psychiatry: Judgement and insight appear normal. Mood & affect appropriate.     Data Reviewed: I have personally reviewed following labs and imaging studies  CBC: Recent Labs  Lab  09/07/21 1152 09/08/21 0037 09/09/21 0748  WBC 10.7* 7.9 9.0  HGB 12.7* 10.8* 9.6*  HCT 38.3* 31.3* 28.6*  MCV 91.2 88.9 91.1  PLT 164 172 135*    Basic Metabolic Panel: Recent Labs  Lab 09/07/21 1152 09/08/21 0037 09/09/21 0748  NA 129* 130* 134*  K 4.0 3.8 3.8  CL 92* 98 95*  CO2 29 25 27   GLUCOSE 127* 120* 98  BUN 11 7 9   CREATININE 0.88 0.77 0.89  CALCIUM 8.6* 8.3* 9.0  MG  --  1.7  --     GFR: Estimated Creatinine Clearance: 92.8 mL/min (by C-G formula based on SCr of 0.89 mg/dL). Liver Function Tests: No results for input(s): "AST", "ALT", "ALKPHOS", "BILITOT", "PROT", "ALBUMIN" in the last 168 hours. No results for input(s): "LIPASE", "AMYLASE" in the last 168 hours. No results for input(s): "AMMONIA" in the last 168 hours. Coagulation Profile: No results for input(s): "INR", "PROTIME" in the last 168 hours. Cardiac Enzymes: No results for input(s): "CKTOTAL", "CKMB", "CKMBINDEX", "TROPONINI" in the last 168 hours. BNP (last 3 results) No results for input(s): "PROBNP" in the last 8760 hours. HbA1C: No results for input(s): "HGBA1C" in the last 72 hours. CBG: No results for input(s): "GLUCAP" in the last 168 hours. Lipid Profile: No results for input(s): "CHOL", "HDL", "LDLCALC", "TRIG", "CHOLHDL", "LDLDIRECT" in the last 72 hours. Thyroid Function Tests: Recent Labs    09/07/21 1425  TSH 2.121  FREET4 0.96    Anemia Panel: No results for input(s): "VITAMINB12", "FOLATE", "FERRITIN", "TIBC", "IRON", "RETICCTPCT" in the last 72 hours. Sepsis Labs: No results for input(s): "PROCALCITON", "LATICACIDVEN" in the last 168 hours.  Recent Results (from the past 240 hour(s))  Surgical pcr screen     Status: None   Collection Time: 09/07/21 11:23 PM   Specimen: Nasal Mucosa; Nasal Swab  Result Value Ref Range Status   MRSA, PCR NEGATIVE NEGATIVE Final   Staphylococcus aureus NEGATIVE NEGATIVE Final    Comment: (NOTE) The Xpert SA Assay (FDA approved for  NASAL specimens in patients 16 years of age and older), is one component of a comprehensive surveillance program. It is not intended to diagnose infection nor to guide or monitor treatment. Performed at [facility], [place] [address], [place], [place] [address]          Radiology Studies: DG FEMUR PORT MIN 2 VIEWS LEFT  Result Date: 09/08/2021 CLINICAL DATA:  Postop left femur EXAM: LEFT FEMUR PORTABLE 2 VIEWS COMPARISON:  Left hip radiographs 09/07/2021 FINDINGS: Interval left intramedullary nail fixation of the previously seen left intertrochanteric fracture with interlocking femoral neck screw and transverse distal interlocking screw. Normal alignment of the dominant and trochanteric fracture line. Mild superior displacement of the fractured lesser trochanter is again seen. No perihardware lucency is seen to indicate hardware failure or loosening. Mild-to-moderate superolateral left acetabular degenerative osteophytosis. Mild medial compartment of the knee joint space narrowing. No knee joint effusion. Moderate to high-grade vascular calcifications. IMPRESSION: Interval ORIF of the left femoral intertrochanteric  fracture with improved alignment. No evidence of hardware failure. Electronically Signed   By: Yvonne Kendall M.D.   On: 09/08/2021 11:32   DG FEMUR MIN 2 VIEWS LEFT  Result Date: 09/08/2021 CLINICAL DATA:  Left femur ORIF EXAM: LEFT FEMUR 2 VIEWS COMPARISON:  09/07/2021 FINDINGS: Six C-arm fluoroscopic images were obtained intraoperatively and submitted for post operative interpretation. Images demonstrate placement of ORIF hardware traversing left intertrochanteric femur fracture. 1 minute 15 seconds fluoroscopy time utilized. Radiation dose: 16.38 mGy. Please see the performing provider's procedural report for further detail. IMPRESSION: Intraoperative ORIF left femur fracture. Electronically Signed   By: Davina Poke D.O.   On: 09/08/2021 10:53   DG C-Arm 1-60 Min-No  Report  Result Date: 09/08/2021 Fluoroscopy was utilized by the requesting physician.  No radiographic interpretation.   DG C-Arm 1-60 Min-No Report  Result Date: 09/08/2021 Fluoroscopy was utilized by the requesting physician.  No radiographic interpretation.        Scheduled Meds:  acetaminophen  500 mg Oral Q12H   amLODipine  5 mg Oral Daily   aspirin EC  81 mg Oral Q breakfast   atorvastatin  40 mg Oral QHS   baclofen  5 mg Oral QHS   diazepam  10 mg Oral QHS   diazepam  5 mg Oral Daily   docusate sodium  100 mg Oral BID   DULoxetine  30 mg Oral Daily   enoxaparin (LOVENOX) injection  40 mg Subcutaneous Q24H   levothyroxine  50 mcg Oral Daily   metoprolol tartrate  12.5 mg Oral BID   pantoprazole  40 mg Oral Daily   valproic acid  500 mg Oral TID   Continuous Infusions:     LOS: 2 days    Time spent 30 min Georgette Shell, MD 09/09/2021, 1:42 PM

## 2021-09-09 NOTE — Evaluation (Signed)
Physical Therapy Evaluation Patient Details Name: [name] MRN: [id number] DOB: Dec 28, 1960 Today's Date: 09/09/2021  History of Present Illness  61 yo male with onset of mechanical fall from his truck, has L hip intertrochanteric fracture now WBAT after ORIF.  PMHx:  CAD, prior CVA, hypertension, dyslipidemia, prior supraglottic squamous cell carcinoma, recurrent squamous cell lung cancer, and prior tobacco and alcohol  Clinical Impression  Pt was assisted to side of bed and then to attempt standing, after which he became nauseated.  Has been through a great deal of chronic pain and issues with his health in the last several years, and would recommend him to go to SNF for care to restore some safe mobility before returning home.  Family and pt have had some negative experiences with SNF care, but will consider this option for him.  PT is recommending SNF with follow up at home of home health, and asking for two person assist to move as pt is both in a lot of chronic pain and struggling with his current hip pain.  Follow for acute PT goals as are outlined below.       Recommendations for follow up therapy are one component of a multi-disciplinary discharge planning process, led by the attending physician.  Recommendations may be updated based on patient status, additional functional criteria and insurance authorization.  Follow Up Recommendations Skilled nursing-short term rehab (<3 hours/day) Can patient physically be transported by private vehicle: No    Assistance Recommended at Discharge Frequent or constant Supervision/Assistance  Patient can return home with the following  Two people to help with walking and/or transfers;A lot of help with bathing/dressing/bathroom;Assistance with cooking/housework;Direct supervision/assist for medications management;Assist for transportation;Help with stairs or ramp for entrance    Equipment Recommendations None recommended by PT  Recommendations for  Other Services       Functional Status Assessment Patient has had a recent decline in their functional status and demonstrates the ability to make significant improvements in function in a reasonable and predictable amount of time.     Precautions / Restrictions Precautions Precautions: Fall Precaution Comments: monitor O2 sats, premedicate Restrictions Weight Bearing Restrictions: Yes LLE Weight Bearing: Weight bearing as tolerated      Mobility  Bed Mobility Overal bed mobility: Needs Assistance Bed Mobility: Supine to Sit, Sit to Supine     Supine to sit: Max assist Sit to supine: Mod assist   General bed mobility comments: max due to pain and difficulty to sit unsupported, requires continual hands on support    Transfers Overall transfer level: Needs assistance Equipment used: Rolling walker (2 wheels), 1 person hand held assist Transfers: Sit to/from Stand Sit to Stand: Max assist           General transfer comment: partial stand with pt declining to retry or get in chair    Ambulation/Gait               General Gait Details: unable to attempt  Stairs            Wheelchair Mobility    Modified Rankin (Stroke Patients Only)       Balance Overall balance assessment: Needs assistance Sitting-balance support: Feet supported, Bilateral upper extremity supported Sitting balance-Leahy Scale: Poor   Postural control: Posterior lean (moments of control but needs supervised contact)   Standing balance-Leahy Scale: Zero  Pertinent Vitals/Pain Pain Assessment Pain Assessment: Faces Faces Pain Scale: Hurts whole lot Pain Location: hip Pain Descriptors / Indicators: Operative site guarding, Grimacing Pain Intervention(s): Limited activity within patient's tolerance, Monitored during session, Premedicated before session, Repositioned, Ice applied    Home Living Family/patient expects to be discharged  to:: Private residence Living Arrangements: Spouse/significant other Available Help at Discharge: Family;Available 24 hours/day Type of Home: House Home Access: Level entry       Home Layout: One level Home Equipment: Conservation officer, nature (2 wheels);Rollator (4 wheels);BSC/3in1 (NEEDS  a shower seat per wife)      Prior Function Prior Level of Function : Independent/Modified Independent;Needs assist       Physical Assist : Mobility (physical) Mobility (physical): Gait   Mobility Comments: was walking with tripod or rollator at times       Hand Dominance   Dominant Hand: Right    Extremity/Trunk Assessment   Upper Extremity Assessment Upper Extremity Assessment: Overall WFL for tasks assessed    Lower Extremity Assessment Lower Extremity Assessment: Generalized weakness    Cervical / Trunk Assessment Cervical / Trunk Assessment: Other exceptions (has lateral deviation in spine with R sb on head, chronic back pain) Cervical / Trunk Exceptions: spinal postural change  Communication   Communication: No difficulties  Cognition Arousal/Alertness: Awake/alert Behavior During Therapy: Anxious Overall Cognitive Status: Difficult to assess                                 General Comments: pt was slow to follow directions initially but was in some pain        General Comments General comments (skin integrity, edema, etc.): Pt was assisted to side of bed and then attempted to stand but is not able to fully get up on walker, declined to let PT assist him directly and began to note nausea    Exercises     Assessment/Plan    PT Assessment Patient needs continued PT services  PT Problem List Decreased strength;Decreased range of motion;Decreased activity tolerance;Decreased balance;Decreased mobility;Decreased coordination;Decreased skin integrity;Pain       PT Treatment Interventions DME instruction;Functional mobility training;Therapeutic  activities;Therapeutic exercise;Balance training;Neuromuscular re-education;Patient/family education;Gait training    PT Goals (Current goals can be found in the Care Plan section)  Acute Rehab PT Goals Patient Stated Goal: to go directly home if possible PT Goal Formulation: With patient/family Time For Goal Achievement: 09/23/21 Potential to Achieve Goals: Good    Frequency Min 5X/week     Co-evaluation               AM-PAC PT "6 Clicks" Mobility  Outcome Measure Help needed turning from your back to your side while in a flat bed without using bedrails?: A Lot Help needed moving from lying on your back to sitting on the side of a flat bed without using bedrails?: A Lot Help needed moving to and from a bed to a chair (including a wheelchair)?: Total Help needed standing up from a chair using your arms (e.g., wheelchair or bedside chair)?: Total Help needed to walk in hospital room?: Total Help needed climbing 3-5 steps with a railing? : Total 6 Click Score: 8    End of Session Equipment Utilized During Treatment: Gait belt;Oxygen Activity Tolerance: Patient limited by fatigue;Patient limited by pain;Treatment limited secondary to medical complications (Comment) Patient left: in bed;with call bell/phone within reach;with bed alarm set;with family/visitor present;with nursing/sitter in room Nurse  Communication: Mobility status PT Visit Diagnosis: Unsteadiness on feet (R26.81);Muscle weakness (generalized) (M62.81);Pain;Difficulty in walking, not elsewhere classified (R26.2);History of falling (Z91.81) Pain - Right/Left: Left Pain - part of body: Hip    Time: 1110-1200 PT Time Calculation (min) (ACUTE ONLY): 50 min   Charges:   PT Evaluation $PT Eval Moderate Complexity: 1 Mod PT Treatments $Therapeutic Activity: 23-37 mins       Ramond Dial 09/09/2021, 1:47 PM  Mee Hives, PT PhD Acute Rehab Dept. Number: [place] and [place]

## 2021-09-09 NOTE — Care Management Important Message (Signed)
Important Message  Patient Details  Name: [name] MRN: [id number] Date of Birth: Aug 02, 1960   Medicare Important Message Given:  Yes     Hannah Beat 09/09/2021, 1:09 PM

## 2021-09-09 NOTE — Progress Notes (Signed)
Orthopaedic Trauma Service Progress Note  Patient ID: [name] MRN: [id number] DOB/AGE: [birth year] 61 y.o.  Subjective:  Pain left hip tolerable No complaints Not hungry   Has not worked with therapy   ROS As above  Objective:   VITALS:   Vitals:   09/08/21 1932 09/08/21 2028 09/09/21 0426 09/09/21 0814  BP: 100/66 122/81 121/71 (!) 176/91  Pulse: 72 75 73 92  Resp: 11 15 15 16   Temp: 98.6 F (37 C) 98.1 F (36.7 C) 97.9 F (36.6 C)   TempSrc: Oral Oral Oral   SpO2: 93% 92% 96% 92%  Weight:      Height:        Estimated body mass index is 21.61 kg/m as calculated from the following:   Height as of this encounter: 6\' 1"  (1.854 m).   Weight as of this encounter: 74.3 kg.   Intake/Output      09/07 0701 09/08 0700 09/08 0701 09/09 0700   P.O.     I.V. (mL/kg) 1500 (20.2)    IV Piggyback 450    Total Intake(mL/kg) 1950 (26.2)    Urine (mL/kg/hr) 650 (0.4) 550 (2.2)   Blood 100    Total Output 750 550   Net +1200 -550        Urine Occurrence 1 x      LABS  Results for orders placed or performed during the hospital encounter of 09/07/21 (from the past 24 hour(s))  CBC     Status: Abnormal   Collection Time: 09/09/21  7:48 AM  Result Value Ref Range   WBC 9.0 4.0 - 10.5 K/uL   RBC 3.14 (L) 4.22 - 5.81 MIL/uL   Hemoglobin 9.6 (L) 13.0 - 17.0 g/dL   HCT 28.6 (L) 39.0 - 52.0 %   MCV 91.1 80.0 - 100.0 fL   MCH 30.6 26.0 - 34.0 pg   MCHC 33.6 30.0 - 36.0 g/dL   RDW 13.9 11.5 - 15.5 %   Platelets 135 (L) 150 - 400 K/uL   nRBC 0.0 0.0 - 0.2 %  Basic metabolic panel     Status: Abnormal   Collection Time: 09/09/21  7:48 AM  Result Value Ref Range   Sodium 134 (L) 135 - 145 mmol/L   Potassium 3.8 3.5 - 5.1 mmol/L   Chloride 95 (L) 98 - 111 mmol/L   CO2 27 22 - 32 mmol/L   Glucose, Bld 98 70 - 99 mg/dL   BUN 9 6 - 20 mg/dL   Creatinine, Ser 0.89 0.61 - 1.24 mg/dL   Calcium  9.0 8.9 - 10.3 mg/dL   GFR, Estimated >60 >60 mL/min   Anion gap 12 5 - 15  VITAMIN D 25 Hydroxy (Vit-D Deficiency, Fractures)     Status: None   Collection Time: 09/09/21  7:48 AM  Result Value Ref Range   Vit D, 25-Hydroxy 35.02 30 - 100 ng/mL     PHYSICAL EXAM:   Gen: sitting up in bed, NAD, pleasant  Lungs: unlabored Ext:       Left Lower Extremity   Dressings clean, dry and intact  Ext warm   + DP pulse  No DCT   Compartments soft   Distal motor and sensory functions intact     Assessment/Plan: 1 Day Post-Op  Anti-infectives (From admission, onward)    Start     Dose/Rate Route Frequency Ordered Stop   09/08/21 1300  ceFAZolin (ANCEF) IVPB 2g/100 mL premix        2 g 200 mL/hr over 30 Minutes Intravenous Every 6 hours 09/08/21 1204 09/08/21 1854   09/08/21 0600  ceFAZolin (ANCEF) IVPB 2g/100 mL premix        2 g 200 mL/hr over 30 Minutes Intravenous On call to O.R. 09/07/21 2246 09/08/21 0901     .  POD/HD#: 1  60 y/o male s/p fall with L hip fracture   -fall with fragility fracture left hip  - left intertrochanteric hip fracture s/p IMN   WBAT with walker  Unrestricted ROM left hip and knee  Ice PRN   Therapy evals  Dressing changes as needed starting tomorrow   - Pain management:  Multimodal   - ABL anemia/Hemodynamics  Stable  Monitor  - Medical issues   Per primary    - DVT/PE prophylaxis:  Lovenox while inpatient  Recommend eliquis 2.5 mg po BID x 30 days at discharge given comorbid conditions - ID:   Periop abx  - Metabolic Bone Disease:  Vitamin d levels are ok  Fracture is still a fragility fracture  Recommend DEXA as outpt   - Activity:  As above  - Impediments to fracture healing:  Fragility fracture    - Dispo:  Ortho issue stable  Therapy evals  TOC consult for SNF  Jari Pigg, PA-C [phone] (C) 09/09/2021, 10:26 AM  [facility] [place] [address] [phone]  Jenetta [phone] (F)    After 5pm and on the weekends please log on to Amion, go to orthopaedics and the look under the [facility] Call for the provider(s) on call. You can also call our office at [phone] and then follow the prompts to be connected to the call team.   Patient ID: [name], male   DOB: [birth year], 61 y.o.   MRN: [id number]

## 2021-09-09 NOTE — Evaluation (Signed)
Occupational Therapy Evaluation Patient Details Name: [name] MRN: [id number] DOB: Nov 16, 1960 Today's Date: 09/09/2021   History of Present Illness 61 yo male with onset of mechanical fall from his truck, has L hip intertrochanteric fracture now WBAT after ORIF.  PMHx:  CAD, prior CVA, hypertension, dyslipidemia, prior supraglottic squamous cell carcinoma, recurrent squamous cell lung cancer, and prior tobacco and alcohol   Clinical Impression   PTA, pt was living with his wife and performed ADLs; using cane and rollator for mobility.  Pt currently requiring Max-Total A for LB ADLs and Total A for lateral scoot. Pt limited significantly by pain. Pt would benefit from further acute OT to facilitate safe dc. Recommend dc to SNF for further OT to optimize safety, independence with ADLs, and return to PLOF.      Recommendations for follow up therapy are one component of a multi-disciplinary discharge planning process, led by the attending physician.  Recommendations may be updated based on patient status, additional functional criteria and insurance authorization.   Follow Up Recommendations  Skilled nursing-short term rehab (<3 hours/day)    Assistance Recommended at Discharge    Patient can return home with the following Two people to help with walking and/or transfers;Two people to help with bathing/dressing/bathroom    Functional Status Assessment  Patient has had a recent decline in their functional status and demonstrates the ability to make significant improvements in function in a reasonable and predictable amount of time.  Equipment Recommendations  Other (comment) (Defer to next venue)    Recommendations for Other Services       Precautions / Restrictions Precautions Precautions: Fall Precaution Comments: monitor O2 sats, premedicate Restrictions Weight Bearing Restrictions: Yes LLE Weight Bearing: Weight bearing as tolerated      Mobility Bed Mobility Overal bed  mobility: Needs Assistance Bed Mobility: Supine to Sit, Sit to Supine     Supine to sit: Mod assist, HOB elevated Sit to supine: Max assist   General bed mobility comments: Mod A to bring trunk into upright posture. Max A to return to supine    Transfers Overall transfer level: Needs assistance   Transfers: Bed to chair/wheelchair/BSC            Lateral/Scoot Transfers: Total assist General transfer comment: Total A for lateral scoot towards HOB      Balance Overall balance assessment: Needs assistance Sitting-balance support: Feet supported, Bilateral upper extremity supported Sitting balance-Leahy Scale: Poor   Postural control: Posterior lean (moments of control but needs supervised contact)                                 ADL either performed or assessed with clinical judgement   ADL Overall ADL's : Needs assistance/impaired Eating/Feeding: Set up;Bed level;Supervision/ safety   Grooming: Supervision/safety;Set up;Bed level   Upper Body Bathing: Moderate assistance;Bed level   Lower Body Bathing: Maximal assistance;Bed level   Upper Body Dressing : Moderate assistance;Bed level   Lower Body Dressing: Total assistance;Bed level                 General ADL Comments: Pt performing bed mobility to EOB and then reporting his leg hurts too bad to stand. Pt requiring Total A to lateral scoot to HOB.     Vision         Perception     Praxis      Pertinent Vitals/Pain Pain Assessment Pain Assessment: Faces Faces  Pain Scale: Hurts whole lot Pain Location: hip Pain Descriptors / Indicators: Operative site guarding, Grimacing Pain Intervention(s): Monitored during session, Repositioned     Hand Dominance Right   Extremity/Trunk Assessment Upper Extremity Assessment Upper Extremity Assessment: Generalized weakness   Lower Extremity Assessment Lower Extremity Assessment: Defer to PT evaluation;LLE deficits/detail LLE Deficits /  Details: L hip intertrochanteric fracture now WBAT after ORIF   Cervical / Trunk Assessment Cervical / Trunk Assessment: Other exceptions (has lateral deviation in spine with R sb on head, chronic back pain) Cervical / Trunk Exceptions: spinal postural change   Communication Communication Communication: No difficulties   Cognition Arousal/Alertness: Awake/alert Behavior During Therapy: Anxious Overall Cognitive Status: Difficult to assess                                 General Comments: Very easily distracted by pain. Following commands. requiring increased encouragement     General Comments  SpO2 88% on RA. 93% on 2L    Exercises Exercises: Other exercises Other Exercises Other Exercises: PROM for long arch kicks at EOB; x10; sitting   Shoulder Instructions      Home Living Family/patient expects to be discharged to:: Private residence Living Arrangements: Spouse/significant other Available Help at Discharge: Family;Available 24 hours/day Type of Home: House Home Access: Level entry     Home Layout: One level     Bathroom Shower/Tub: Teacher, early years/pre: Standard     Home Equipment: Conservation officer, nature (2 wheels);Rollator (4 wheels);BSC/3in1          Prior Functioning/Environment Prior Level of Function : Independent/Modified Independent;Needs assist             Mobility Comments: Using cane or rollator ADLs Comments: Performs ADLs, IADLs, and driving        OT Problem List: Decreased strength;Decreased range of motion;Decreased activity tolerance;Impaired balance (sitting and/or standing)      OT Treatment/Interventions: Self-care/ADL training;Therapeutic exercise;Energy conservation;DME and/or AE instruction;Therapeutic activities;Patient/family education    OT Goals(Current goals can be found in the care plan section) Acute Rehab OT Goals Patient Stated Goal: Decreased pain OT Goal Formulation: With patient Time For Goal  Achievement: 09/23/21 Potential to Achieve Goals: Good  OT Frequency: Min 2X/week    Co-evaluation              AM-PAC OT "6 Clicks" Daily Activity     Outcome Measure Help from another person eating meals?: A Little Help from another person taking care of personal grooming?: A Little Help from another person toileting, which includes using toliet, bedpan, or urinal?: A Lot Help from another person bathing (including washing, rinsing, drying)?: A Lot Help from another person to put on and taking off regular upper body clothing?: A Lot Help from another person to put on and taking off regular lower body clothing?: Total 6 Click Score: 13   End of Session Equipment Utilized During Treatment: Oxygen Nurse Communication: Mobility status  Activity Tolerance: Patient limited by pain Patient left: in bed;with bed alarm set;with call bell/phone within reach;with nursing/sitter in room  OT Visit Diagnosis: Other abnormalities of gait and mobility (R26.89);Unsteadiness on feet (R26.81);Muscle weakness (generalized) (M62.81)                Time: 8338-2505 OT Time Calculation (min): 21 min Charges:  OT General Charges $OT Visit: 1 Visit OT Evaluation $OT Eval Moderate Complexity: 1 Mod  Kreed Kauffman MSOT, OTR/L  Acute Rehab Office: [place] 09/09/2021, 5:32 PM

## 2021-09-09 NOTE — Progress Notes (Signed)
Speech Language Pathology Treatment: Dysphagia  Patient Details Name: [name] MRN: [id number] DOB: 1960/01/23 Today's Date: 09/09/2021 Time: 5003-7048 SLP Time Calculation (min) (ACUTE ONLY): 8 min  Assessment / Plan / Recommendation Clinical Impression  Prior to seeing pt RN informed this therapist that pt consumed small pills with water without difficulty. He was more awake today and in less pain and more interactive. Consumed several bites of applesauce with good control and transit as well as water via straw. No indications of airway intrusion. He is at higher risk given history of laryngeal radiation and educated of relationship of dysphagia and laryngeal radiation. Relayed information about outpatient SLP who has a radiation program to help maintain laryngeal/pharyngeal pliability for swallow however he stated he "had already done all that." Pt eats puree at home due to his preference and recommend continue puree texture, thin liquids. No further ST needed.    HPI HPI: [name] is a 61 y.o. male with medical history significant for supraglottic squamous cell carcinoma, recurrent squamous cell lung cancer,  CAD, CVA, hypertension, dyslipidemia, tobacco/alcohol use who presented to the ED with acute onset left hip pain and knee pain after a fall from his truck earlier this morning. Underwent hip surgery am of 9/7. Radiation for supraglottic cancer 2 years ago with PEG and now eats puree diet due to lack of dentition.MBS 02/21/19 without penetration or aspiration. Puree and thin recommended. Pt reports he has a new diagnosis of lung cancer and will start treatments soon.      SLP Plan  All goals met;Discharge SLP treatment due to (comment)      Recommendations for follow up therapy are one component of a multi-disciplinary discharge planning process, led by the attending physician.  Recommendations may be updated based on patient status, additional functional criteria and insurance  authorization.    Recommendations  Diet recommendations: Dysphagia 1 (puree);Thin liquid Liquids provided via: Straw;Cup Medication Administration: Whole meds with liquid Supervision: Patient able to self feed Compensations: Slow rate;Small sips/bites                Oral Care Recommendations: Oral care BID Follow Up Recommendations: No SLP follow up Assistance recommended at discharge: None SLP Visit Diagnosis: Dysphagia, unspecified (R13.10) Plan: All goals met;Discharge SLP treatment due to (comment)           Houston Siren  09/09/2021, 10:49 AM

## 2021-09-10 DIAGNOSIS — S72002A Fracture of unspecified part of neck of left femur, initial encounter for closed fracture: Secondary | ICD-10-CM | POA: Diagnosis not present

## 2021-09-10 LAB — BASIC METABOLIC PANEL
Anion gap: 8 (ref 5–15)
BUN: 10 mg/dL (ref 6–20)
CO2: 27 mmol/L (ref 22–32)
Calcium: 8.3 mg/dL — ABNORMAL LOW (ref 8.9–10.3)
Chloride: 96 mmol/L — ABNORMAL LOW (ref 98–111)
Creatinine, Ser: 0.9 mg/dL (ref 0.61–1.24)
GFR, Estimated: >60 mL/min (ref >60)
Glucose, Bld: 100 mg/dL — ABNORMAL HIGH (ref 70–99)
Potassium: 3.8 mmol/L (ref 3.5–5.1)
Sodium: 131 mmol/L — ABNORMAL LOW (ref 135–145)

## 2021-09-10 LAB — CBC
HCT: 23.8 % — ABNORMAL LOW (ref 39.0–52.0)
Hemoglobin: 7.9 g/dL — ABNORMAL LOW (ref 13.0–17.0)
MCH: 29.9 pg (ref 26.0–34.0)
MCHC: 33.2 g/dL (ref 30.0–36.0)
MCV: 90.2 fL (ref 80.0–100.0)
Platelets: 135 10*3/uL — ABNORMAL LOW (ref 150–400)
RBC: 2.64 MIL/uL — ABNORMAL LOW (ref 4.22–5.81)
RDW: 13.7 % (ref 11.5–15.5)
WBC: 9.2 10*3/uL (ref 4.0–10.5)
nRBC: 0 % (ref 0.0–0.2)

## 2021-09-10 MED ORDER — BISACODYL 5 MG PO TBEC
10.0000 mg | DELAYED_RELEASE_TABLET | Freq: Every day | ORAL | Status: DC
Start: 2021-09-10 — End: 2021-09-12
  Administered 2021-09-10: 10 mg via ORAL
  Filled 2021-09-10: qty 2

## 2021-09-10 NOTE — Progress Notes (Signed)
PROGRESS NOTE    [name]  VZD:[id number] DOB: [birth year] DOA: 09/07/2021 PCP: [name], MD   Brief Narrative: [name] is a 61 y.o. male with medical history significant for CAD, prior CVA, hypertension, dyslipidemia, prior supraglottic squamous cell carcinoma, recurrent squamous cell lung cancer, and prior tobacco and alcohol use who presented to the ED with acute onset left hip pain and knee pain after a fall from his truck earlier this morning.  He denies any loss of consciousness or dizziness prior to the episode and states that he simply twisted around his cane getting out of his truck which led to the fall.  He denies any head injury and was not able to ambulate secondary to the pain.  He denies any numbness, tingling, back pain, neck pain, chest pain, shortness of breath, abdominal pain, nausea, or vomiting.  No fevers or chills noted.   ED Course: Stable vital signs noted with mild leukocytosis of 10,700.  Hemoglobin 12.7 and sodium 129.  Left proximal, nondisplaced femur fracture noted and EDP has discussed with orthopedics Dr. Amedeo Kinsman regarding the need for operative intervention.    Assessment & Plan:   Principal Problem:   Hip fracture ([place]) Active Problems:   Hypertension   Hyponatremia   Coronary artery disease   Stroke [facility])   Head and neck cancer (HCC)   #1 status post fall with acute left proximal femur fracture status post intramedullary nailing of the left hip 09/08/2021 Dr. Marcelino Scot Lovenox while in hospital and Eliquis on discharge for DVT prophylaxis  #2 history of essential hypertension blood pressure 176/91.  Continue Norvasc and Lopressor.   #3 hyponatremia sodium improved 131 TSH 2.1  #4 hyperlipidemia on Lipitor  #5 hypothyroidism continue Synthroid normal TSH  #6 history of stroke on aspirin and statin  #7 history of anxiety depression on Cymbalta and Valium  #8 seizure disorder on valproic acid  #9 squamous cell lung CA outpatient  follow-up  #10 history of supraglottic cancer seen by speech therapy patient is mild aspiration risk on dysphagia 1 diet  #11 GERD on PPI  #12 acute blood loss anemia.  Hemoglobin 7.9 from 10.8 from 12.7 will monitor.  Estimated body mass index is 21.61 kg/m as calculated from the following:   Height as of this encounter: 6\' 1"  (1.854 m).   Weight as of this encounter: 74.3 kg.  DVT prophylaxis: Lovenox starting 09/09/2021  code Status: Full code  family Communication: None at bedside Disposition Plan:  Status is: Inpatient Remains inpatient appropriate because: Hip fracture   Consultants:  Ortho  Procedures: ORIF Antimicrobials: None  Subjective:  Has not had a BM waiting for wife to bring his breakfast continues to complain of severe pain  Objective: Vitals:   09/10/21 0000 09/10/21 0100 09/10/21 0118 09/10/21 0352  BP: (!) 80/56 (!) 87/56 101/64 117/67  Pulse: (!) 58 (!) 58 66 65  Resp: 13 12 19 12   Temp:    98.5 F (36.9 C)  TempSrc:    Oral  SpO2: 90% 90% 96% 96%  Weight:      Height:       No intake or output data in the 24 hours ending 09/10/21 1416  Filed Weights   09/07/21 1048 09/07/21 2129  Weight: 74.4 kg 74.3 kg    Examination:  General exam: Appears calm and comfortable  Respiratory system: Clear to auscultation. Respiratory effort normal. Cardiovascular system: S1 & S2 heard, RRR. No JVD, murmurs, rubs, gallops or clicks. No  pedal edema. Gastrointestinal system: Abdomen is nondistended, soft and nontender. No organomegaly or masses felt. Normal bowel sounds heard. Central nervous system: Alert and oriented. No focal neurological deficits. Extremities: Left hip incision covered with dressing. Skin: No rashes, lesions or ulcers Psychiatry: Judgement and insight appear normal. Mood & affect appropriate.     Data Reviewed: I have personally reviewed following labs and imaging studies  CBC: Recent Labs  Lab 09/07/21 1152 09/08/21 0037  09/09/21 0748 09/10/21 0054  WBC 10.7* 7.9 9.0 9.2  HGB 12.7* 10.8* 9.6* 7.9*  HCT 38.3* 31.3* 28.6* 23.8*  MCV 91.2 88.9 91.1 90.2  PLT 164 172 135* 135*    Basic Metabolic Panel: Recent Labs  Lab 09/07/21 1152 09/08/21 0037 09/09/21 0748 09/10/21 0054  NA 129* 130* 134* 131*  K 4.0 3.8 3.8 3.8  CL 92* 98 95* 96*  CO2 29 25 27 27   GLUCOSE 127* 120* 98 100*  BUN 11 7 9 10   CREATININE 0.88 0.77 0.89 0.90  CALCIUM 8.6* 8.3* 9.0 8.3*  MG  --  1.7  --   --     GFR: Estimated Creatinine Clearance: 91.7 mL/min (by C-G formula based on SCr of 0.9 mg/dL). Liver Function Tests: No results for input(s): "AST", "ALT", "ALKPHOS", "BILITOT", "PROT", "ALBUMIN" in the last 168 hours. No results for input(s): "LIPASE", "AMYLASE" in the last 168 hours. No results for input(s): "AMMONIA" in the last 168 hours. Coagulation Profile: No results for input(s): "INR", "PROTIME" in the last 168 hours. Cardiac Enzymes: No results for input(s): "CKTOTAL", "CKMB", "CKMBINDEX", "TROPONINI" in the last 168 hours. BNP (last 3 results) No results for input(s): "PROBNP" in the last 8760 hours. HbA1C: No results for input(s): "HGBA1C" in the last 72 hours. CBG: No results for input(s): "GLUCAP" in the last 168 hours. Lipid Profile: No results for input(s): "CHOL", "HDL", "LDLCALC", "TRIG", "CHOLHDL", "LDLDIRECT" in the last 72 hours. Thyroid Function Tests: Recent Labs    09/07/21 1425  TSH 2.121  FREET4 0.96    Anemia Panel: No results for input(s): "VITAMINB12", "FOLATE", "FERRITIN", "TIBC", "IRON", "RETICCTPCT" in the last 72 hours. Sepsis Labs: No results for input(s): "PROCALCITON", "LATICACIDVEN" in the last 168 hours.  Recent Results (from the past 240 hour(s))  Surgical pcr screen     Status: None   Collection Time: 09/07/21 11:23 PM   Specimen: Nasal Mucosa; Nasal Swab  Result Value Ref Range Status   MRSA, PCR NEGATIVE NEGATIVE Final   Staphylococcus aureus NEGATIVE NEGATIVE  Final    Comment: (NOTE) The Xpert SA Assay (FDA approved for NASAL specimens in patients 12 years of age and older), is one component of a comprehensive surveillance program. It is not intended to diagnose infection nor to guide or monitor treatment. Performed at [facility], [place] [address] [place], [place] [address]          Radiology Studies: No results found.      Scheduled Meds:  acetaminophen (TYLENOL) oral liquid 160 mg/5 mL  500 mg Oral Q12H   amLODipine  5 mg Oral Daily   aspirin  81 mg Oral Q breakfast   atorvastatin  40 mg Oral QHS   baclofen  5 mg Oral QHS   bisacodyl  10 mg Oral Daily   diazepam  10 mg Oral QHS   diazepam  5 mg Oral Daily   docusate  100 mg Oral BID   DULoxetine  30 mg Oral Daily   enoxaparin (LOVENOX) injection  40  mg Subcutaneous Q24H   levothyroxine  50 mcg Oral Daily   metoprolol tartrate  12.5 mg Oral BID   pantoprazole  40 mg Oral Daily   polyethylene glycol  17 g Oral Daily   sennosides  5 mL Oral QHS   valproic acid  500 mg Oral TID   Continuous Infusions:     LOS: 3 days    Time spent 30 min Georgette Shell, MD 09/10/2021, 2:16 PM

## 2021-09-10 NOTE — Progress Notes (Signed)
   ORTHOPAEDIC PROGRESS NOTE  s/p Procedure(s): INTRAMEDULLARY NAILING OF LEFT FEMUR  SUBJECTIVE: Reports pain in his left hip. Asking for pain meds. He is currently undergoing radiation therapy. Scheduled for another radiation treatment on Friday 9/15 per wife. She brought his dentures in, so he states he can tolerate more solid foods now. No chest pain. No SOB. No nausea/vomiting. No other complaints.  OBJECTIVE: PE: LLE: dressings CDI - small amount of strikethrough noted on bandage, leg lengths equal, warm well perfused foot, intact EHL/TA/GSC   Vitals:   09/10/21 0118 09/10/21 0352  BP: 101/64 117/67  Pulse: 66 65  Resp: 19 12  Temp:  98.5 F (36.9 C)  SpO2: 96% 96%     ASSESSMENT: PAVLE WILER is a 61 y.o. male POD#2  PLAN: Weightbearing: WBAT LLE  - WBAT with walker  - Unrestricted ROM left hip and knee Insicional and dressing care: Reinforce dressings as needed Orthopedic device(s): None VTE prophylaxis: Lovenox Pain control: PRN pain medications, minimize narcotics as able  ABLA: Hgb 7.9. Continue to monitor closely.  Follow - up plan: 2 weeks in office with Dr. Marcelino Scot Dispo: TBD. PT/OT recommending SNF Contact information: After hours and holidays please check [email or web address] for group call information for [facility]  Noemi Chapel, PA-C 09/10/2021

## 2021-09-10 NOTE — Progress Notes (Signed)
Physical Therapy Treatment Patient Details Name: [name] MRN: [id number] DOB: [birth year] Today's Date: 09/10/2021   History of Present Illness 61 yo male with onset of mechanical fall from his truck, has L hip intertrochanteric fracture now WBAT after ORIF.  PMHx:  CAD, prior CVA, hypertension, dyslipidemia, prior supraglottic squamous cell carcinoma, recurrent squamous cell lung cancer, and prior tobacco and alcohol    PT Comments    Pt declining OOB due to pain but agreeable to bed exercises. Pt performed BLE exercises supine, AROM RLE and AAROM LLE. Assisted pt with scooting up in bed at end of session. HOB at 31 degrees.    Recommendations for follow up therapy are one component of a multi-disciplinary discharge planning process, led by the attending physician.  Recommendations may be updated based on patient status, additional functional criteria and insurance authorization.  Follow Up Recommendations  Skilled nursing-short term rehab (<3 hours/day) Can patient physically be transported by private vehicle: No   Assistance Recommended at Discharge Frequent or constant Supervision/Assistance  Patient can return home with the following Two people to help with walking and/or transfers;A lot of help with bathing/dressing/bathroom;Assistance with cooking/housework;Direct supervision/assist for medications management;Assist for transportation;Help with stairs or ramp for entrance   Equipment Recommendations  None recommended by PT    Recommendations for Other Services       Precautions / Restrictions Precautions Precautions: Fall;Other (comment) Precaution Comments: monitor O2 sats, premedicate Restrictions LLE Weight Bearing:  (with RW) Other Position/Activity Restrictions: unrestricted ROM L hip and knee     Mobility  Bed Mobility                    Transfers                        Ambulation/Gait                   Stairs              Wheelchair Mobility    Modified Rankin (Stroke Patients Only)       Balance                                            Cognition Arousal/Alertness: Awake/alert Behavior During Therapy: Flat affect Overall Cognitive Status: Within Functional Limits for tasks assessed                                 General Comments: internally distracted by pain hindering ability to stay on task        Exercises General Exercises - Lower Extremity Ankle Circles/Pumps: AROM, Both, 10 reps, Supine Quad Sets: AROM, Both, 10 reps, Supine Gluteal Sets: AROM, Both, 10 reps, Supine Heel Slides: AROM, AAROM, Right, Left, 10 reps, Supine Hip ABduction/ADduction: AROM, AAROM, Right, Left, 10 reps, Supine    General Comments General comments (skin integrity, edema, etc.): VSS on 3L      Pertinent Vitals/Pain Pain Assessment Pain Assessment: 0-10 Pain Score: 10-Worst pain ever Pain Location: LLE Pain Descriptors / Indicators: Operative site guarding, Grimacing, Discomfort Pain Intervention(s): Limited activity within patient's tolerance, Repositioned, Premedicated before session    Home Living  Prior Function            PT Goals (current goals can now be found in the care plan section) Acute Rehab PT Goals Patient Stated Goal: home Progress towards PT goals: Not progressing toward goals - comment (pain)    Frequency    Min 5X/week      PT Plan Current plan remains appropriate    Co-evaluation              AM-PAC PT "6 Clicks" Mobility   Outcome Measure  Help needed turning from your back to your side while in a flat bed without using bedrails?: A Lot Help needed moving from lying on your back to sitting on the side of a flat bed without using bedrails?: A Lot Help needed moving to and from a bed to a chair (including a wheelchair)?: Total Help needed standing up from a chair using your arms (e.g.,  wheelchair or bedside chair)?: Total Help needed to walk in hospital room?: Total Help needed climbing 3-5 steps with a railing? : Total 6 Click Score: 8    End of Session Equipment Utilized During Treatment: Gait belt;Oxygen Activity Tolerance: Patient limited by pain Patient left: in bed;with call bell/phone within reach;with family/visitor present Nurse Communication: Mobility status PT Visit Diagnosis: Unsteadiness on feet (R26.81);Muscle weakness (generalized) (M62.81);Pain;Difficulty in walking, not elsewhere classified (R26.2);History of falling (Z91.81) Pain - Right/Left: Left Pain - part of body: Leg     Time: 9937-1696 PT Time Calculation (min) (ACUTE ONLY): 16 min  Charges:  $Therapeutic Exercise: 8-22 mins                     Lorrin Goodell, PT  Office # [phone] Pager [phone]    Lorriane Shire 09/10/2021, 11:23 AM

## 2021-09-11 DIAGNOSIS — S72002A Fracture of unspecified part of neck of left femur, initial encounter for closed fracture: Secondary | ICD-10-CM | POA: Diagnosis not present

## 2021-09-11 LAB — CBC
HCT: 26.1 % — ABNORMAL LOW (ref 39.0–52.0)
Hemoglobin: 8.9 g/dL — ABNORMAL LOW (ref 13.0–17.0)
MCH: 30.4 pg (ref 26.0–34.0)
MCHC: 34.1 g/dL (ref 30.0–36.0)
MCV: 89.1 fL (ref 80.0–100.0)
Platelets: 155 10*3/uL (ref 150–400)
RBC: 2.93 MIL/uL — ABNORMAL LOW (ref 4.22–5.81)
RDW: 13.7 % (ref 11.5–15.5)
WBC: 8.5 10*3/uL (ref 4.0–10.5)
nRBC: 0 % (ref 0.0–0.2)

## 2021-09-11 LAB — BASIC METABOLIC PANEL
Anion gap: 8 (ref 5–15)
BUN: 6 mg/dL (ref 6–20)
CO2: 29 mmol/L (ref 22–32)
Calcium: 8.4 mg/dL — ABNORMAL LOW (ref 8.9–10.3)
Chloride: 93 mmol/L — ABNORMAL LOW (ref 98–111)
Creatinine, Ser: 0.67 mg/dL (ref 0.61–1.24)
GFR, Estimated: >60 mL/min (ref >60)
Glucose, Bld: 106 mg/dL — ABNORMAL HIGH (ref 70–99)
Potassium: 3.7 mmol/L (ref 3.5–5.1)
Sodium: 130 mmol/L — ABNORMAL LOW (ref 135–145)

## 2021-09-11 MED ORDER — HYDROCODONE-ACETAMINOPHEN 7.5-325 MG/15ML PO SOLN
10.0000 mL | ORAL | Status: DC | PRN
  Administered 2021-09-11: 15 mL via ORAL
  Administered 2021-09-12 (×3): 20 mL via ORAL
  Filled 2021-09-11 (×3): qty 30

## 2021-09-11 MED ORDER — METHOCARBAMOL 1000 MG/10ML IJ SOLN
500.0000 mg | Freq: Three times a day (TID) | INTRAMUSCULAR | Status: DC | PRN
  Administered 2021-09-12: 500 mg via INTRAVENOUS
  Filled 2021-09-11: qty 500

## 2021-09-11 MED ORDER — AMLODIPINE BESYLATE 10 MG PO TABS: 10.0000 mg | ORAL_TABLET | Freq: Every day | ORAL | Status: DC

## 2021-09-11 MED ORDER — SODIUM CHLORIDE 0.9 % IV SOLN: INTRAVENOUS | Status: DC

## 2021-09-11 MED ORDER — AMLODIPINE BESYLATE 5 MG PO TABS
5.0000 mg | ORAL_TABLET | Freq: Every day | ORAL | Status: DC
  Administered 2021-09-12 – 2021-09-13 (×2): 5 mg via ORAL
  Filled 2021-09-11 (×2): qty 1

## 2021-09-11 MED ORDER — ENSURE ENLIVE PO LIQD
237.0000 mL | Freq: Two times a day (BID) | ORAL | Status: DC
  Administered 2021-09-11 – 2021-09-13 (×5): 237 mL via ORAL

## 2021-09-11 MED ORDER — METOPROLOL TARTRATE 25 MG/10 ML ORAL SUSPENSION
25.0000 mg | Freq: Two times a day (BID) | ORAL | Status: DC
  Administered 2021-09-11 – 2021-09-13 (×4): 25 mg via ORAL
  Filled 2021-09-11 (×5): qty 10

## 2021-09-11 NOTE — Progress Notes (Signed)
   ORTHOPAEDIC PROGRESS NOTE  s/p Procedure(s): INTRAMEDULLARY NAILING OF LEFT FEMUR  SUBJECTIVE: Reports pain in his left hip. Asking for pain meds. States he does not feel like his pain is adequately controlled. He is currently undergoing radiation therapy. Scheduled for another radiation treatment on Friday 9/15 per wife. She brought his dentures in, so he states he can tolerate more solid foods now. No chest pain. No SOB. No nausea/vomiting. No other complaints.  OBJECTIVE: PE: LLE: dressings CDI - small amount of strikethrough noted on bandage, tender to palpation left hip, leg lengths equal, warm well perfused foot, intact EHL/TA/GSC   Vitals:   09/10/21 0352 09/10/21 1907  BP: 117/67 122/81  Pulse: 65 89  Resp: 12 18  Temp: 98.5 F (36.9 C) 98.3 F (36.8 C)  SpO2: 96% 94%     ASSESSMENT: [name] is a 61 y.o. male POD#3  PLAN: Weightbearing: WBAT LLE  - WBAT with walker  - Unrestricted ROM left hip and knee Insicional and dressing care: Reinforce dressings as needed Orthopedic device(s): None VTE prophylaxis: Lovenox Pain control: PRN pain medications, minimize narcotics as able. Increased Norco to every 4 hours. Added Robaxin as well.  ABLA: Hgb 8.9. Trending up. Continue to monitor closely.  Follow - up plan: 2 weeks in office with Dr. Marcelino Scot Dispo: TBD. PT/OT recommending SNF Contact information: After hours and holidays please check [email or web address] for group call information for [facility]  Noemi Chapel, PA-C 09/11/2021

## 2021-09-11 NOTE — Progress Notes (Cosign Needed Addendum)
RE: [name]. [place]  Date of Birth: 02-08-1960  Date: 09/11/2021  To Whom It May Concern:  Please be advised that the above-named patient will require a short-term nursing home stay - anticipated 30 days or less for rehabilitation and strengthening. The plan is for return home.

## 2021-09-11 NOTE — TOC Initial Note (Addendum)
Transition of Care [facility]) - Initial/Assessment Note    Patient Details  Name: [name] MRN: [id number] Date of Birth: 1960/01/13  Transition of Care [facility] CM/SW Contact:    [name], [place] Phone Number: 09/11/2021, 10:05 AM  Clinical Narrative:                  Update- 10:35am- CSW spoke with patients spouse [name] regarding PT recommendation of SNF placement for patient when patient medically ready for dc. Patients spouse expressed understanding of PT recommendation  and is agreeable to SNF placement at time of discharge for patient.Patients spouse reports patient comes from home with her. Patients spouse gave CSW permission to fax out initial referral near the [place], and [place] area.. CSW discussed insurance authorization process and will provide Medicare SNF ratings list with accepted SNF bed offers when available. Patients spouse reports patient has received the COVID vaccines. Patients passr currently pending.CSW awaiting MD cosign on FL2 and 30 day note to submit requested clinicals to [place] must for review for patients pending passr number. No further questions reported at this time. CSW to continue to follow and assist with discharge planning needs.    CSW spoke with patient regarding PT recommendation of SNF placement at time of dc. Patient request for CSW to call his spouse [name] regarding his dc plan. CSW called patients spouse [name] and LVM. CSW awaiting callback. CSW will continue to follow and assist with patients dc planning needs.     Patient Goals and CMS Choice        Expected Discharge Plan and Services                                                Prior Living Arrangements/Services                       Activities of Daily Living Home Assistive Devices/Equipment: Bedside commode/3-in-1, Cane (specify quad or straight), Dentures (specify type), Grab bars around toilet, Hand-held shower hose, Shower chair without back ADL  Screening (condition at time of admission) Patient's cognitive ability adequate to safely complete daily activities?: Yes Is the patient deaf or have difficulty hearing?: No Does the patient have difficulty seeing, even when wearing glasses/contacts?: No Does the patient have difficulty concentrating, remembering, or making decisions?: No Patient able to express need for assistance with ADLs?: Yes Does the patient have difficulty dressing or bathing?: Yes Independently performs ADLs?: Yes (appropriate for developmental age) Does the patient have difficulty walking or climbing stairs?: No Weakness of Legs: None Weakness of Arms/Hands: None  Permission Sought/Granted                  Emotional Assessment              Admission diagnosis:  Hip fracture ([place]) [S72.009A] Closed nondisplaced intertrochanteric fracture of left femur, initial encounter ([place]) [S72.145A] Patient Active Problem List   Diagnosis Date Noted   Hip fracture ([place]) 09/07/2021   Left lower lobe pulmonary nodule 09/01/2020   Malignant neoplasm of upper lobe of right lung ([place]) 07/31/2020   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[place]) 07/31/2020   Right upper lobe pulmonary nodule 06/17/2020   Generalized weakness 05/01/2020   Orthostatic hypotension 05/01/2020   Orthostasis    Syncope 04/30/2020   Hypokalemia 04/13/2020   Diarrhea  04/13/2020   Hypomagnesemia 04/13/2020   PEG (percutaneous endoscopic gastrostomy) adjustment/replacement/removal ([place]) 08/11/2019   PEG tube malfunction ([place]) 08/10/2019   HCAP (healthcare-associated pneumonia) 04/10/2019   Febrile illness, acute 04/08/2019   Moderate protein-calorie malnutrition ([place]) 04/03/2019   GI bleed 04/01/2019   Dehydration 03/28/2019   RLL pneumonia 03/17/2019   Hx of tracheostomy 02/18/2019   Head and neck cancer ([place]) 02/18/2019   Malignant neoplasm of supraglottis ([place]) 01/22/2019   Stroke ([place]) 07/31/2018   Rotator cuff syndrome  of right shoulder 02/20/2013   Bradycardia 11/06/2012   Coronary artery disease    Abnormal finding on cardiovascular stress test 09/25/2012   Hypertension 09/03/2012   Chest pain 09/03/2012   Alcohol abuse, daily use 09/03/2012   Hyperlipidemia 09/03/2012   Hyponatremia 09/03/2012   PCP:  [name], MD Pharmacy:   [facility], [address] [place] [address] Phone: [phone] Fax: [phone]     Social Determinants of Health (SDOH) Interventions    Readmission Risk Interventions    08/13/2019   10:13 AM 04/10/2019   12:12 PM  Readmission Risk Prevention Plan  Transportation Screening Complete Complete  Medication Review Press photographer) Complete Complete  HRI or Home Care Consult Complete Complete  Palliative Care Screening Not Applicable Not [place] Not Applicable Not Applicable

## 2021-09-11 NOTE — Progress Notes (Signed)
PROGRESS NOTE    [name]  ZGY:[id number] DOB: [birth year] DOA: 09/07/2021 PCP: [name], MD   Brief Narrative: [name] is a 61 y.o. male with medical history significant for CAD, prior CVA, hypertension, dyslipidemia, prior supraglottic squamous cell carcinoma, recurrent squamous cell lung cancer, and prior tobacco and alcohol use who presented to the ED with acute onset left hip pain and knee pain after a fall from his truck earlier this morning.  He denies any loss of consciousness or dizziness prior to the episode and states that he simply twisted around his cane getting out of his truck which led to the fall.  He denies any head injury and was not able to ambulate secondary to the pain.  He denies any numbness, tingling, back pain, neck pain, chest pain, shortness of breath, abdominal pain, nausea, or vomiting.  No fevers or chills noted.   ED Course: Stable vital signs noted with mild leukocytosis of 10,700.  Hemoglobin 12.7 and sodium 129.  Left proximal, nondisplaced femur fracture noted and EDP has discussed with orthopedics Dr. Amedeo Kinsman regarding the need for operative intervention.    Assessment & Plan:   Principal Problem:   Hip fracture ([place]) Active Problems:   Hypertension   Hyponatremia   Coronary artery disease   Stroke [facility])   Head and neck cancer (HCC)   #1 status post fall with acute left proximal femur fracture status post intramedullary nailing of the left hip 09/08/2021 Dr. Marcelino Scot Lovenox while in hospital and Eliquis on discharge for DVT prophylaxis  #2 history of essential hypertension blood pressure 176/91.  Continue Norvasc and Lopressor.   #3 hyponatremia sodium improved 131 TSH 2.1  #4 hyperlipidemia on Lipitor  #5 hypothyroidism continue Synthroid normal TSH  #6 history of stroke on aspirin and statin  #7 history of anxiety depression on Cymbalta and Valium  #8 seizure disorder on valproic acid  #9 squamous cell lung CA outpatient  follow-up  #10 history of supraglottic cancer seen by speech therapy patient is mild aspiration risk on dysphagia 1 diet  #11 GERD on PPI  #12 acute blood loss anemia.  Hemoglobin 7.9 from 10.8 from 12.7 will monitor.  Estimated body mass index is 21.61 kg/m as calculated from the following:   Height as of this encounter: 6\' 1"  (1.854 m).   Weight as of this encounter: 74.3 kg.  DVT prophylaxis: Lovenox starting 09/09/2021  code Status: Full code  family Communication: Discussed with wife Disposition Plan:  Status is: Inpatient Remains inpatient appropriate because: Hip fracture   Consultants:  Ortho  Procedures: ORIF Antimicrobials: None  Subjective:  Patient resting in bed reports his pain is bad  Objective: Vitals:   09/10/21 0118 09/10/21 0352 09/10/21 1907 09/11/21 0740  BP: 101/64 117/67 122/81 (!) 165/94  Pulse: 66 65 89 93  Resp: 19 12 18 18   Temp:  98.5 F (36.9 C) 98.3 F (36.8 C) (!) 100.8 F (38.2 C)  TempSrc:  Oral  Oral  SpO2: 96% 96% 94% 95%  Weight:      Height:        Intake/Output Summary (Last 24 hours) at 09/11/2021 1412 Last data filed at 09/11/2021 0700 Gross per 24 hour  Intake --  Output 450 ml  Net -450 ml    Filed Weights   09/07/21 1048 09/07/21 2129  Weight: 74.4 kg 74.3 kg    Examination:  General exam: Appears calm and comfortable  Respiratory system: Clear to auscultation. Respiratory effort normal.  Cardiovascular system: S1 & S2 heard, RRR. No JVD, murmurs, rubs, gallops or clicks. No pedal edema. Gastrointestinal system: Abdomen is nondistended, soft and nontender. No organomegaly or masses felt. Normal bowel sounds heard. Central nervous system: Alert and oriented. No focal neurological deficits. Extremities: Left hip incision covered with dressing. Skin: No rashes, lesions or ulcers Psychiatry: Judgement and insight appear normal. Mood & affect appropriate.     Data Reviewed: I have personally reviewed following  labs and imaging studies  CBC: Recent Labs  Lab 09/07/21 1152 09/08/21 0037 09/09/21 0748 09/10/21 0054 09/11/21 0030  WBC 10.7* 7.9 9.0 9.2 8.5  HGB 12.7* 10.8* 9.6* 7.9* 8.9*  HCT 38.3* 31.3* 28.6* 23.8* 26.1*  MCV 91.2 88.9 91.1 90.2 89.1  PLT 164 172 135* 135* 417    Basic Metabolic Panel: Recent Labs  Lab 09/07/21 1152 09/08/21 0037 09/09/21 0748 09/10/21 0054 09/11/21 0030  NA 129* 130* 134* 131* 130*  K 4.0 3.8 3.8 3.8 3.7  CL 92* 98 95* 96* 93*  CO2 29 25 27 27 29   GLUCOSE 127* 120* 98 100* 106*  BUN 11 7 9 10 6   CREATININE 0.88 0.77 0.89 0.90 0.67  CALCIUM 8.6* 8.3* 9.0 8.3* 8.4*  MG  --  1.7  --   --   --     GFR: Estimated Creatinine Clearance: 103.2 mL/min (by C-G formula based on SCr of 0.67 mg/dL). Liver Function Tests: No results for input(s): "AST", "ALT", "ALKPHOS", "BILITOT", "PROT", "ALBUMIN" in the last 168 hours. No results for input(s): "LIPASE", "AMYLASE" in the last 168 hours. No results for input(s): "AMMONIA" in the last 168 hours. Coagulation Profile: No results for input(s): "INR", "PROTIME" in the last 168 hours. Cardiac Enzymes: No results for input(s): "CKTOTAL", "CKMB", "CKMBINDEX", "TROPONINI" in the last 168 hours. BNP (last 3 results) No results for input(s): "PROBNP" in the last 8760 hours. HbA1C: No results for input(s): "HGBA1C" in the last 72 hours. CBG: No results for input(s): "GLUCAP" in the last 168 hours. Lipid Profile: No results for input(s): "CHOL", "HDL", "LDLCALC", "TRIG", "CHOLHDL", "LDLDIRECT" in the last 72 hours. Thyroid Function Tests: No results for input(s): "TSH", "T4TOTAL", "FREET4", "T3FREE", "THYROIDAB" in the last 72 hours.  Anemia Panel: No results for input(s): "VITAMINB12", "FOLATE", "FERRITIN", "TIBC", "IRON", "RETICCTPCT" in the last 72 hours. Sepsis Labs: No results for input(s): "PROCALCITON", "LATICACIDVEN" in the last 168 hours.  Recent Results (from the past 240 hour(s))  Surgical pcr  screen     Status: None   Collection Time: 09/07/21 11:23 PM   Specimen: Nasal Mucosa; Nasal Swab  Result Value Ref Range Status   MRSA, PCR NEGATIVE NEGATIVE Final   Staphylococcus aureus NEGATIVE NEGATIVE Final    Comment: (NOTE) The Xpert SA Assay (FDA approved for NASAL specimens in patients 50 years of age and older), is one component of a comprehensive surveillance program. It is not intended to diagnose infection nor to guide or monitor treatment. Performed at [facility], [place] [address], [place], [place] [address]          Radiology Studies: No results found.      Scheduled Meds:  acetaminophen (TYLENOL) oral liquid 160 mg/5 mL  500 mg Oral Q12H   [START ON 09/12/2021] amLODipine  5 mg Oral Daily   aspirin  81 mg Oral Q breakfast   atorvastatin  40 mg Oral QHS   baclofen  5 mg Oral QHS   bisacodyl  10 mg Oral Daily  diazepam  10 mg Oral QHS   diazepam  5 mg Oral Daily   docusate  100 mg Oral BID   DULoxetine  30 mg Oral Daily   enoxaparin (LOVENOX) injection  40 mg Subcutaneous Q24H   feeding supplement  237 mL Oral BID BM   levothyroxine  50 mcg Oral Daily   metoprolol tartrate  25 mg Oral BID   pantoprazole  40 mg Oral Daily   polyethylene glycol  17 g Oral Daily   sennosides  5 mL Oral QHS   valproic acid  500 mg Oral TID   Continuous Infusions:  methocarbamol (ROBAXIN) IV        LOS: 4 days    Time spent 30 min Georgette Shell, MD 09/11/2021, 2:12 PM

## 2021-09-11 NOTE — NC FL2 (Signed)
[place] LEVEL OF CARE SCREENING TOOL     IDENTIFICATION  Patient Name: [name] Birthdate: 07-05-1960 Sex: male Admission Date (Current Location): 09/07/2021  [facility] and [place] Number:  Herbalist and Address:  [facility] [place], [place] [address] [place], [place] [address]      Provider Number: [id number]  Attending Physician Name and Address:  Georgette Shell, MD  Relative Name and Phone Number:  [name] (Spouse) [phone]    Current Level of Care: Hospital Recommended Level of Care: [place] Prior Approval Number:    Date Approved/Denied:   PASRR Number: PASRR under review  Discharge Plan: SNF    Current Diagnoses: Patient Active Problem List   Diagnosis Date Noted   Hip fracture ([place]) 09/07/2021   Left lower lobe pulmonary nodule 09/01/2020   Malignant neoplasm of upper lobe of right lung ([place]) 07/31/2020   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[place]) 07/31/2020   Right upper lobe pulmonary nodule 06/17/2020   Generalized weakness 05/01/2020   Orthostatic hypotension 05/01/2020   Orthostasis    Syncope 04/30/2020   Hypokalemia 04/13/2020   Diarrhea 04/13/2020   Hypomagnesemia 04/13/2020   PEG (percutaneous endoscopic gastrostomy) adjustment/replacement/removal ([place]) 08/11/2019   PEG tube malfunction ([place]) 08/10/2019   HCAP (healthcare-associated pneumonia) 04/10/2019   Febrile illness, acute 04/08/2019   Moderate protein-calorie malnutrition ([place]) 04/03/2019   GI bleed 04/01/2019   Dehydration 03/28/2019   RLL pneumonia 03/17/2019   Hx of tracheostomy 02/18/2019   Head and neck cancer ([place]) 02/18/2019   Malignant neoplasm of supraglottis ([place]) 01/22/2019   Stroke ([place]) 07/31/2018   Rotator cuff syndrome of right shoulder 02/20/2013   Bradycardia 11/06/2012   Coronary artery disease    Abnormal finding on cardiovascular stress test 09/25/2012   Hypertension 09/03/2012    Chest pain 09/03/2012   Alcohol abuse, daily use 09/03/2012   Hyperlipidemia 09/03/2012   Hyponatremia 09/03/2012    Orientation RESPIRATION BLADDER Height & Weight     Self, Time, Situation, Place  O2 (Nasal Cannula 3 liters) Continent, External catheter (External Urinary Catheter) Weight: 163 lb 12.8 oz (74.3 kg) Height:  6\' 1"  (185.4 cm)  BEHAVIORAL SYMPTOMS/MOOD NEUROLOGICAL BOWEL NUTRITION STATUS      Continent Diet (Please see discharge summary)  AMBULATORY STATUS COMMUNICATION OF NEEDS Skin   Extensive Assist Verbally Other (Comment) (Appropriate for ethnicity,dry,Incision closed,hip,L,silver hydrofiber,clean,dry,intact,dressing in place)                       Personal Care Assistance Level of Assistance  Bathing, Feeding, Dressing Bathing Assistance: Maximum assistance Feeding assistance: Independent (can feed himself) Dressing Assistance: Maximum assistance     Functional Limitations Info  Sight, Hearing, Speech Sight Info: Impaired Hearing Info: Adequate (WDL) Speech Info: Adequate    SPECIAL CARE FACTORS FREQUENCY  PT (By licensed PT), OT (By licensed OT)     PT Frequency: 5x min weekly OT Frequency: 5x min weekly            Contractures Contractures Info: Not present    Additional Factors Info  Code Status, Allergies, Psychotropic Code Status Info: FULL Allergies Info: Cymbalta (duloxetine Hcl),Duloxetine,Prednisone Psychotropic Info: diazepam (VALIUM) tablet 10 mg daily at bedtime,diazepam (VALIUM) tablet 5 mg daily,valproic acid (DEPAKENE) 250 MG/5ML solution 500 mg 3 times daily         Current Medications (09/11/2021):  This is the current hospital active medication list Current Facility-Administered Medications  Medication Dose Route Frequency Provider Last Rate Last Admin   acetaminophen (TYLENOL) 160 MG/5ML solution 325-650 mg  325-650 mg Oral Q6H PRN Reome, Earle J, RPH       acetaminophen (TYLENOL) 160 MG/5ML solution 500 mg  500 mg  Oral Q12H Reome, Earle J, RPH   500 mg at 09/11/21 0745   acetaminophen (TYLENOL) suppository 650 mg  650 mg Rectal Q6H PRN Ainsley Spinner, PA-C       albuterol (PROVENTIL) (2.5 MG/3ML) 0.083% nebulizer solution 2.5 mg  2.5 mg Nebulization Q4H PRN Ainsley Spinner, PA-C       [START ON 09/12/2021] amLODipine (NORVASC) tablet 5 mg  5 mg Oral Daily Georgette Shell, MD       aspirin chewable tablet 81 mg  81 mg Oral Q breakfast Reome, Earle J, RPH   81 mg at 09/11/21 0735   atorvastatin (LIPITOR) tablet 40 mg  40 mg Oral QHS Reome, Earle J, RPH   40 mg at 09/10/21 2212   baclofen (OZOBAX) 1 mg/mL oral solution 5 mg  5 mg Oral QHS Reome, Earle J, RPH   5 mg at 09/10/21 2220   bisacodyl (DULCOLAX) EC tablet 10 mg  10 mg Oral Daily Georgette Shell, MD   10 mg at 09/10/21 1358   bisacodyl (DULCOLAX) suppository 10 mg  10 mg Rectal Daily PRN Ainsley Spinner, PA-C       diazepam (VALIUM) tablet 10 mg  10 mg Oral QHS Reome, Earle J, RPH   10 mg at 09/10/21 2212   diazepam (VALIUM) tablet 5 mg  5 mg Oral Daily Reome, Earle J, RPH   5 mg at 09/11/21 0744   docusate (COLACE) 50 MG/5ML liquid 100 mg  100 mg Oral BID Reome, Earle J, RPH   100 mg at 09/10/21 2211   DULoxetine (CYMBALTA) DR capsule 30 mg  30 mg Oral Daily Ainsley Spinner, PA-C   30 mg at 09/11/21 0744   enoxaparin (LOVENOX) injection 40 mg  40 mg Subcutaneous Q24H Ainsley Spinner, PA-C   40 mg at 09/11/21 0735   feeding supplement (ENSURE ENLIVE / ENSURE PLUS) liquid 237 mL  237 mL Oral BID BM Georgette Shell, MD   237 mL at 09/11/21 1055   HYDROcodone-acetaminophen (HYCET) 7.5-325 mg/15 ml solution 10-20 mL  10-20 mL Oral Q4H PRN Ethelda Chick, PA-C   15 mL at 09/11/21 0745   HYDROmorphone (DILAUDID) injection 0.5-1 mg  0.5-1 mg Intravenous Q2H PRN Ainsley Spinner, PA-C   1 mg at 09/11/21 1055   levothyroxine (SYNTHROID) tablet 50 mcg  50 mcg Oral Daily Reome, Earle J, RPH   50 mcg at 09/11/21 0531   menthol-cetylpyridinium (CEPACOL) lozenge 3 mg  1  lozenge Oral PRN Ainsley Spinner, PA-C       Or   phenol (CHLORASEPTIC) mouth spray 1 spray  1 spray Mouth/Throat PRN Ainsley Spinner, PA-C       methocarbamol (ROBAXIN) 500 mg in dextrose 5 % 50 mL IVPB  500 mg Intravenous Q8H PRN McBane, Maylene Roes, PA-C       metoprolol tartrate (LOPRESSOR) 25 mg/10 mL oral suspension 25 mg  25 mg Oral BID Georgette Shell, MD       morphine (PF) 2 MG/ML injection 0.5-1 mg  0.5-1 mg Intravenous Q2H PRN Ainsley Spinner, PA-C       nitroGLYCERIN (NITROSTAT) SL tablet 0.4 mg  0.4 mg Sublingual Q5 min PRN Ainsley Spinner, PA-C  ondansetron (ZOFRAN) tablet 4 mg  4 mg Oral Q6H PRN Ainsley Spinner, PA-C   4 mg at 09/08/21 1258   Or   ondansetron (ZOFRAN) injection 4 mg  4 mg Intravenous Q6H PRN Ainsley Spinner, PA-C       pantoprazole (PROTONIX) 2 mg/mL oral suspension 40 mg  40 mg Oral Daily Reome, Earle J, RPH   40 mg at 09/11/21 0756   polyethylene glycol (MIRALAX / GLYCOLAX) packet 17 g  17 g Oral Daily Ainsley Spinner, PA-C   17 g at 09/10/21 0846   sennosides (SENOKOT) 8.8 MG/5ML syrup 5 mL  5 mL Oral QHS Reome, Earle J, RPH   5 mL at 09/10/21 2211   valproic acid (DEPAKENE) 250 MG/5ML solution 500 mg  500 mg Oral TID Ainsley Spinner, PA-C   500 mg at 09/11/21 0745     Discharge Medications: Please see discharge summary for a list of discharge medications.  Relevant Imaging Results:  Relevant Lab Results:   Additional Information [id number]  Milas Gain, LCSWA

## 2021-09-12 ENCOUNTER — Telehealth: Payer: Self-pay | Admitting: Adult Health

## 2021-09-12 ENCOUNTER — Inpatient Hospital Stay (HOSPITAL_COMMUNITY): Payer: Medicare Other

## 2021-09-12 ENCOUNTER — Telehealth: Payer: Self-pay | Admitting: Emergency Medicine

## 2021-09-12 DIAGNOSIS — C7802 Secondary malignant neoplasm of left lung: Secondary | ICD-10-CM | POA: Diagnosis not present

## 2021-09-12 DIAGNOSIS — S72002A Fracture of unspecified part of neck of left femur, initial encounter for closed fracture: Secondary | ICD-10-CM | POA: Diagnosis not present

## 2021-09-12 DIAGNOSIS — C321 Malignant neoplasm of supraglottis: Secondary | ICD-10-CM | POA: Diagnosis not present

## 2021-09-12 MED ORDER — DOCUSATE SODIUM 50 MG/5ML PO LIQD
100.0000 mg | Freq: Every day | ORAL | Status: DC | PRN
Start: 2021-09-12 — End: 2021-09-14

## 2021-09-12 NOTE — Progress Notes (Signed)
Modified Barium Swallow Progress Note  Patient Details  Name: [name] MRN: [id number] Date of Birth: [birth year]  Today's Date: 09/12/2021  Modified Barium Swallow completed.  Full report located under Chart Review in the Imaging Section.  Brief recommendations include the following:  Clinical Impression  Pt presents with iatrogenic oropharyngeal dysphagia s/p radiation treatment for laryngeal cancer. Pt's dysphagia was characterized by reduction in tongue base retraction, hyolaryngeal elevation, anterior laryngeal movement, pharyngeal constriction, and PES distention. He exhibited weak lingual manipulation, lingual pumping, as well as base of tongue, vallecular, and pyriform sinus residue. Residue was reduced with liquid washes and secondary swallows. Complete epiglottic inversion was never achieved despite postural modifications or prompted effortful swallows. Penetration (PAS 3) was consistently noted during the swallow due to reduced laryngeal closure and aspiration (PAS 7) was noted thereafter due to pharyngeal residue. This occured across solid and liquid (i.e., thin, nectar, honey thick) consistencies. With purees and liquids, aspiration was more immediate than with more advanced solids. No functional benefit was observed with a chin tuck posture or with prompted effortful swallows in reducing residue or laryngeal invasion. A left head turn was effective in reducing the amount of laryngeal invasion. Pt's cough was often effective in expelling smaller amounts of aspirated material just below the vocal folds, but the strength of pt's swallow was often inadequate to clear this material from the pharnx and it subsequently fell into his larynx once more. Pt expressed that current priority is his pleasure and that he understands that has been told in the past by MDs that he will always aspirate. Based on conversation with pt, a dysphagia 3 diet with thin liquids will be initiated with observance of  swallowing precautions to reduce risk of dysphagia-related adverse events. SLP will follow up later today for further discussion with pt and his wife.    Swallow Evaluation Recommendations       SLP Diet Recommendations: Dysphagia 3 (Mech soft) solids;Thin liquid   Liquid Administration via: Cup;No straw   Medication Administration: Whole meds with puree   Supervision: Intermittent supervision to cue for compensatory strategies;Full supervision/cueing for compensatory strategies   Compensations: Slow rate;Small sips/bites;Follow solids with liquid (left head turn)   Postural Changes: Seated upright at 90 degrees   Oral Care Recommendations: Oral care BID      [name] I. Hardin Negus, [place], [place] Office number [phone]  Horton Marshall 09/12/2021,2:38 PM

## 2021-09-12 NOTE — TOC Progression Note (Signed)
Transition of Care [facility]) - Progression Note    Patient Details  Name: [name] MRN: [id number] Date of Birth: 1960-07-01  Transition of Care [facility]) CM/SW [place], [place] Phone Number: 09/12/2021, 2:30 PM  Clinical Narrative:    CSW met spouse at bedside and provided bed offer. Spouse interested in the pending options at [facility] or [facility]. Spouse reports pt's radiation MD stated [facility] would be able to transport pt from SNF to radiation. CSW provided phone number to give to MD to confirm transport. CSW left VM for [facility] to review pt's chart. CSW will pend authorization and update when a facility is chosen. TOC will continue to follow for DC needs.        Expected Discharge Plan and Services                                                 Social Determinants of Health (SDOH) Interventions    Readmission Risk Interventions    08/13/2019   10:13 AM 04/10/2019   12:12 PM  Readmission Risk Prevention Plan  Transportation Screening Complete Complete  Medication Review [employer]) Complete Complete  HRI or Home Care Consult Complete Complete  Palliative Care Screening Not Applicable Not [place] Not Applicable Not Applicable

## 2021-09-12 NOTE — Telephone Encounter (Signed)
Pt's wife, [name] pt had a fall getting out the car and cane twisted and he fell.Could not get him up. Son-in-law come picked him up and put him in a chair while waiting for EMS. They found one side of neck was 50% blockage, other side was 70% blockage. Would like to a call from the nurse to discuss if a scan can be done while he is in hospital at [facility].

## 2021-09-12 NOTE — Progress Notes (Signed)
PROGRESS NOTE    [name]  HBZ:[id number] DOB: [birth year] DOA: 09/07/2021 PCP: [name], MD   Brief Narrative: [name] is a 61 y.o. male with medical history significant for CAD, prior CVA, hypertension, dyslipidemia, prior supraglottic squamous cell carcinoma, recurrent squamous cell lung cancer, and prior tobacco and alcohol use who presented to the ED with acute onset left hip pain and knee pain after a fall from his truck earlier this morning.  He denies any loss of consciousness or dizziness prior to the episode and states that he simply twisted around his cane getting out of his truck which led to the fall.  He denies any head injury and was not able to ambulate secondary to the pain.  He denies any numbness, tingling, back pain, neck pain, chest pain, shortness of breath, abdominal pain, nausea, or vomiting.  No fevers or chills noted.   ED Course: Stable vital signs noted with mild leukocytosis of 10,700.  Hemoglobin 12.7 and sodium 129.  Left proximal, nondisplaced femur fracture noted and EDP has discussed with orthopedics Dr. Amedeo Kinsman regarding the need for operative intervention.    Assessment & Plan:   Principal Problem:   Hip fracture ([place]) Active Problems:   Hypertension   Hyponatremia   Coronary artery disease   Stroke [facility])   Head and neck cancer (HCC)   #1 status post fall with acute left proximal femur fracture status post intramedullary nailing of the left hip 09/08/2021 Dr. Marcelino Scot Lovenox while in hospital and Eliquis on discharge for DVT prophylaxis  #2 history of essential hypertension blood pressure 146/78 continue Norvasc and Lopressor.   #3 hyponatremia sodium improved 131 TSH 2.1  #4 hyperlipidemia on Lipitor  #5 hypothyroidism continue Synthroid normal TSH  #6 history of stroke on aspirin and statin  #7 history of anxiety depression on Cymbalta and Valium  #8 seizure disorder on valproic acid  #9 squamous cell lung CA outpatient  follow-up  #10 history of supraglottic cancer seen by speech therapy patient is mild aspiration risk on dysphagia 1 diet  #11 GERD on PPI  #12 acute blood loss anemia.  Hemoglobin 7.9 from 10.8 from 12.7 will monitor.  Estimated body mass index is 21.61 kg/m as calculated from the following:   Height as of this encounter: 6\' 1"  (1.854 m).   Weight as of this encounter: 74.3 kg.  DVT prophylaxis: Lovenox starting 09/09/2021  code Status: Full code  family Communication: Discussed with wife Disposition Plan:  Status is: Inpatient Remains inpatient appropriate because: Hip fracture await SNF   Consultants:  Ortho  Procedures: ORIF Antimicrobials: None  Subjective:  He was started on IV fluids last evening as his p.o. intake was poor and urine output was minimal  Objective: Vitals:   09/11/21 0740 09/11/21 1953 09/12/21 0353 09/12/21 0700  BP: (!) 165/94 101/67 125/76 (!) 146/78  Pulse: 93 74 82   Resp: 18 12 18    Temp: (!) 100.8 F (38.2 C) 99.6 F (37.6 C)  99.1 F (37.3 C)  TempSrc: Oral Oral  Oral  SpO2: 95% 94% 95%   Weight:      Height:        Intake/Output Summary (Last 24 hours) at 09/12/2021 1222 Last data filed at 09/12/2021 0900 Gross per 24 hour  Intake 734.64 ml  Output 175 ml  Net 559.64 ml    Filed Weights   09/07/21 1048 09/07/21 2129  Weight: 74.4 kg 74.3 kg    Examination:  General exam:  Appears calm and comfortable  Respiratory system: Clear to auscultation. Respiratory effort normal. Cardiovascular system: S1 & S2 heard, RRR. No JVD, murmurs, rubs, gallops or clicks. No pedal edema. Gastrointestinal system: Abdomen is nondistended, soft and nontender. No organomegaly or masses felt. Normal bowel sounds heard. Central nervous system: Alert and oriented. No focal neurological deficits. Extremities: Left hip incision covered with dressing. Skin: No rashes, lesions or ulcers Psychiatry: Judgement and insight appear normal. Mood & affect  appropriate.     Data Reviewed: I have personally reviewed following labs and imaging studies  CBC: Recent Labs  Lab 09/07/21 1152 09/08/21 0037 09/09/21 0748 09/10/21 0054 09/11/21 0030  WBC 10.7* 7.9 9.0 9.2 8.5  HGB 12.7* 10.8* 9.6* 7.9* 8.9*  HCT 38.3* 31.3* 28.6* 23.8* 26.1*  MCV 91.2 88.9 91.1 90.2 89.1  PLT 164 172 135* 135* 665    Basic Metabolic Panel: Recent Labs  Lab 09/07/21 1152 09/08/21 0037 09/09/21 0748 09/10/21 0054 09/11/21 0030  NA 129* 130* 134* 131* 130*  K 4.0 3.8 3.8 3.8 3.7  CL 92* 98 95* 96* 93*  CO2 29 25 27 27 29   GLUCOSE 127* 120* 98 100* 106*  BUN 11 7 9 10 6   CREATININE 0.88 0.77 0.89 0.90 0.67  CALCIUM 8.6* 8.3* 9.0 8.3* 8.4*  MG  --  1.7  --   --   --     GFR: Estimated Creatinine Clearance: 103.2 mL/min (by C-G formula based on SCr of 0.67 mg/dL). Liver Function Tests: No results for input(s): "AST", "ALT", "ALKPHOS", "BILITOT", "PROT", "ALBUMIN" in the last 168 hours. No results for input(s): "LIPASE", "AMYLASE" in the last 168 hours. No results for input(s): "AMMONIA" in the last 168 hours. Coagulation Profile: No results for input(s): "INR", "PROTIME" in the last 168 hours. Cardiac Enzymes: No results for input(s): "CKTOTAL", "CKMB", "CKMBINDEX", "TROPONINI" in the last 168 hours. BNP (last 3 results) No results for input(s): "PROBNP" in the last 8760 hours. HbA1C: No results for input(s): "HGBA1C" in the last 72 hours. CBG: No results for input(s): "GLUCAP" in the last 168 hours. Lipid Profile: No results for input(s): "CHOL", "HDL", "LDLCALC", "TRIG", "CHOLHDL", "LDLDIRECT" in the last 72 hours. Thyroid Function Tests: No results for input(s): "TSH", "T4TOTAL", "FREET4", "T3FREE", "THYROIDAB" in the last 72 hours.  Anemia Panel: No results for input(s): "VITAMINB12", "FOLATE", "FERRITIN", "TIBC", "IRON", "RETICCTPCT" in the last 72 hours. Sepsis Labs: No results for input(s): "PROCALCITON", "LATICACIDVEN" in the  last 168 hours.  Recent Results (from the past 240 hour(s))  Surgical pcr screen     Status: None   Collection Time: 09/07/21 11:23 PM   Specimen: Nasal Mucosa; Nasal Swab  Result Value Ref Range Status   MRSA, PCR NEGATIVE NEGATIVE Final   Staphylococcus aureus NEGATIVE NEGATIVE Final    Comment: (NOTE) The Xpert SA Assay (FDA approved for NASAL specimens in patients 6 years of age and older), is one component of a comprehensive surveillance program. It is not intended to diagnose infection nor to guide or monitor treatment. Performed at [facility], [place] [address], [place], [place] [address]          Radiology Studies: No results found.      Scheduled Meds:  acetaminophen (TYLENOL) oral liquid 160 mg/5 mL  500 mg Oral Q12H   amLODipine  5 mg Oral Daily   aspirin  81 mg Oral Q breakfast   atorvastatin  40 mg Oral QHS   baclofen  5 mg Oral QHS  diazepam  10 mg Oral QHS   diazepam  5 mg Oral Daily   DULoxetine  30 mg Oral Daily   enoxaparin (LOVENOX) injection  40 mg Subcutaneous Q24H   feeding supplement  237 mL Oral BID BM   levothyroxine  50 mcg Oral Daily   metoprolol tartrate  25 mg Oral BID   pantoprazole  40 mg Oral Daily   polyethylene glycol  17 g Oral Daily   sennosides  5 mL Oral QHS   valproic acid  500 mg Oral TID   Continuous Infusions:  sodium chloride 75 mL/hr at 09/12/21 0530   methocarbamol (ROBAXIN) IV        LOS: 5 days    Time spent 25 minutes  Georgette Shell, MD 09/12/2021, 12:22 PM

## 2021-09-12 NOTE — Telephone Encounter (Signed)
Contacted pt spouse, she stated they found blockage wanted to know if something else could be done or he needed stints. Informed her they had to do scans already in order to find the blockage, that's they only way they would have known that. Also, if it is that sever that he needs stints they can and will address that there while he is in the hospital. She verbally understood and was appreciative.

## 2021-09-12 NOTE — Evaluation (Signed)
Clinical/Bedside Swallow Evaluation Patient Details  Name: [name] MRN: [id number] Date of Birth: 09-Apr-1960  Today's Date: 09/12/2021 Time: SLP Start Time (ACUTE ONLY): 1206 SLP Stop Time (ACUTE ONLY): 1224 SLP Time Calculation (min) (ACUTE ONLY): 18 min  Past Medical History:  Past Medical History:  Diagnosis Date   Anemia    Anxiety    Arthritis    Cancer of larynx (HCC)    Carotid artery stenosis    Chronic lower back pain    Coronary artery disease    a. Diag cath 10/2012 for CP/abnormal nuc -> PCI s/p LAD atherectomy/DES placement 11/05/12.   Depression    Dilated aortic root (HCC)    GERD (gastroesophageal reflux disease)    History of hiatal hernia    Hypercholesteremia    Hypertension    Lung cancer ([place])    Pneumonia    S/P percutaneous endoscopic gastrostomy (PEG) tube placement [facility])    Stroke ([place]) 07/31/2018   Pons   Past Surgical History:  Past Surgical History:  Procedure Laterality Date   BIOPSY  11/24/2019   Procedure: BIOPSY;  Surgeon: Irving Copas., MD;  Location: [place];  Service: Gastroenterology;;   BIOPSY  03/29/2020   Procedure: BIOPSY;  Surgeon: Irving Copas., MD;  Location: [facility];  Service: Gastroenterology;;   BRONCHIAL BIOPSY  07/26/2020   Procedure: BRONCHIAL BIOPSIES;  Surgeon: Garner Nash, DO;  Location: [place] [facility];  Service: Pulmonary;;   BRONCHIAL BRUSHINGS  07/26/2020   Procedure: BRONCHIAL BRUSHINGS;  Surgeon: Garner Nash, DO;  Location: [place] [facility];  Service: Pulmonary;;   BRONCHIAL NEEDLE ASPIRATION BIOPSY  07/26/2020   Procedure: BRONCHIAL NEEDLE ASPIRATION BIOPSIES;  Surgeon: Garner Nash, DO;  Location: [place];  Service: Pulmonary;;   CARDIAC CATHETERIZATION  10/29/2012   COLONOSCOPY WITH PROPOFOL N/A 11/24/2019   Procedure: COLONOSCOPY WITH PROPOFOL;  Surgeon: Irving Copas., MD;  Location: [facility];  Service: Gastroenterology;  Laterality: N/A;   CORONARY  ANGIOPLASTY WITH STENT PLACEMENT  11/05/2012   DIRECT LARYNGOSCOPY N/A 01/02/2019   Procedure: MICRO DIRECT LARYNGOSCOPY BIOPSY OF LARYNGEAL MASS;  Surgeon: [name], MD;  Location: [place];  Service: ENT;  Laterality: N/A;   ESOPHAGOGASTRODUODENOSCOPY (EGD) WITH PROPOFOL N/A 08/11/2019   Procedure: ATTEMPTED ESOPHAGOGASTRODUODENOSCOPY (EGD) WITH PROPOFOL (UNABLE TO PERFORM PERCUTANEOUS ENDOSCOPIC GASTROSTOMY TUBE REPLACEMENT);  Surgeon: [name], MD;  Location: [facility];  Service: Gastroenterology;  Laterality: N/A;   ESOPHAGOGASTRODUODENOSCOPY (EGD) WITH PROPOFOL N/A 11/24/2019   Procedure: ESOPHAGOGASTRODUODENOSCOPY (EGD) WITH PROPOFOL;  Surgeon: Rush Landmark Telford Nab., MD;  Location: [place];  Service: Gastroenterology;  Laterality: N/A;   ESOPHAGOGASTRODUODENOSCOPY (EGD) WITH PROPOFOL N/A 01/26/2020   Procedure: ESOPHAGOGASTRODUODENOSCOPY (EGD) WITH PROPOFOL;  Surgeon: Rush Landmark Telford Nab., MD;  Location: [place];  Service: Gastroenterology;  Laterality: N/A;   ESOPHAGOGASTRODUODENOSCOPY (EGD) WITH PROPOFOL N/A 03/29/2020   Procedure: ESOPHAGOGASTRODUODENOSCOPY (EGD) WITH PROPOFOL;  Surgeon: Rush Landmark Telford Nab., MD;  Location: [facility];  Service: Gastroenterology;  Laterality: N/A;  NEEDS FLOURO   FIDUCIAL MARKER PLACEMENT  07/26/2020   Procedure: FIDUCIAL MARKER PLACEMENT;  Surgeon: Garner Nash, DO;  Location: [facility];  Service: Pulmonary;;   HEMORRHOID SURGERY  ~ 2010   INSERTION OF MESH N/A 06/02/2015   Procedure: INSERTION OF MESH;  Surgeon: [name], MD;  Location: [facility];  Service: General;  Laterality: N/A;   INTRAMEDULLARY (IM) NAIL INTERTROCHANTERIC Left 09/08/2021   Procedure: INTRAMEDULLARY NAILING OF LEFT FEMUR;  Surgeon: Altamese [place], MD;  Location: [place];  Service:  Orthopedics;  Laterality: Left;   IR ANGIO INTRA EXTRACRAN SEL COM CAROTID INNOMINATE BILAT MOD SED  08/02/2018   IR ANGIO VERTEBRAL SEL VERTEBRAL BILAT MOD SED  08/02/2018   IR REPLACE  G-TUBE SIMPLE WO FLUORO  08/12/2019   IR REPLC GASTRO/COLONIC TUBE PERCUT W/FLUORO  08/13/2019   LAPAROSCOPIC INSERTION GASTROSTOMY TUBE N/A 02/24/2019   Procedure: LAPAROSCOPIC INSERTION GASTROSTOMY TUBE;  Surgeon: [name], MD;  Location: [place];  Service: General;  Laterality: N/A;   LEFT HEART CATHETERIZATION WITH CORONARY ANGIOGRAM N/A 10/30/2012   Procedure: LEFT HEART CATHETERIZATION WITH CORONARY ANGIOGRAM;  Surgeon: [name], MD;  Location: [place] [facility];  Service: Cardiovascular;  Laterality: N/A;   MULTIPLE EXTRACTIONS WITH ALVEOLOPLASTY N/A 02/18/2019   Procedure: Extraction of tooth #'s 5-7, 10,11,14,20-29, 31 and 32 with alveoloplasty and maxiillary left buccal exostoses reductions;  Surgeon: [name], [name];  Location: [place];  Service: Oral Surgery;  Laterality: N/A;   PEG PLACEMENT N/A 01/26/2020   Procedure: PERCUTANEOUS ENDOSCOPIC GASTROSTOMY (PEG) REPLACEMENT;  Surgeon: Irving Copas., MD;  Location: [place];  Service: Gastroenterology;  Laterality: N/A;   PERCUTANEOUS CORONARY ROTOBLATOR INTERVENTION (PCI-R) N/A 11/05/2012   Procedure: PERCUTANEOUS CORONARY ROTOBLATOR INTERVENTION (PCI-R);  Surgeon: [name], MD;  [facility];  Service: Cardiovascular;  Laterality: N/A;   SAVORY DILATION N/A 11/24/2019   Procedure: SAVORY DILATION;  Surgeon: Rush Landmark Telford Nab., MD;  Location: [place];  Service: Gastroenterology;  Laterality: N/A;   SAVORY DILATION N/A 01/26/2020   Procedure: SAVORY DILATION;  Surgeon: Rush Landmark Telford Nab., MD;  Location: [place];  Service: Gastroenterology;  Laterality: N/A;   TRACHEOSTOMY TUBE PLACEMENT N/A 02/18/2019   Procedure: Tracheostomy;  Surgeon: [name], MD;  Location: [place];  Service: ENT;  Laterality: N/A;   UMBILICAL HERNIA REPAIR N/A 06/02/2015   Procedure: UMBILICAL HERNIORRHAPHY WITH MESH;  Surgeon: [name], MD;  Location[facility];  Service: General;  Laterality: N/A;   VIDEO  BRONCHOSCOPY WITH ENDOBRONCHIAL NAVIGATION Bilateral 07/26/2020   Procedure: VIDEO BRONCHOSCOPY WITH ENDOBRONCHIAL NAVIGATION;  Surgeon: Garner Nash, DO;  Location: [place];  Service: Pulmonary;  Laterality: Bilateral;  ION   VIDEO BRONCHOSCOPY WITH RADIAL ENDOBRONCHIAL ULTRASOUND  07/26/2020   Procedure: VIDEO BRONCHOSCOPY WITH RADIAL ENDOBRONCHIAL ULTRASOUND;  Surgeon: Garner Nash, DO;  Location: [facility];  Service: Pulmonary;;   HPI:  Pt is a 61 y.o. male who presented to the ED with acute onset left hip pain and knee pain after a fall from his truck. Pt underwent hip surgery on 9/7. PMH: supraglottic squamous cell carcinoma (radiation 2 years PTA with PEG, but also eats p.o.), recurrent squamous cell lung cancer,  CAD, CVA, hypertension, dyslipidemia, tobacco/alcohol use. MBS 02/21/19 without penetration or aspiration. Dysphagia 1 and thin recommended due to reduced dentition. Pt seen by SLP 09/08/21-09/09/21 with recommendation for dysphagia 1 and thin liquids. SLP reconsulted 9/11 due to the arrival of pt's dentures.    Assessment / Plan / Recommendation  Clinical Impression  Pt was seen for bedside swallow evaluation with his wife present. Pt denied any difficulty swallowing, but pt's wife stated that the pt "gets choked" with p.o. intake. Pt presented with full dentures and stated that he typically eats regular food when they are donned; per the pt's wife, she has to cook his meats softer. Pt exhibited symptoms of pharyngeal dysphagia characterized by signs of aspiration with thin liquids and with dysphagia 3 solids. Considering pt's performance today, his pt's history of  radiation treatment for laryngeal cancer, and potential for iatrogenic dysphagia, a modified barium swallow study is recommended to further assess swallow physiology. It is scheduled for today at 1330. SLP Visit Diagnosis: Dysphagia, unspecified (R13.10)    Aspiration Risk  Mild aspiration risk;Moderate aspiration  risk    Diet Recommendation Dysphagia 1 (Puree);Thin liquid (defer advancement until MBS completed)   Liquid Administration via: Cup;Straw Medication Administration: Whole meds with puree Supervision: Patient able to self feed Compensations: Slow rate;Small sips/bites    Other  Recommendations Oral Care Recommendations: Oral care BID    Recommendations for follow up therapy are one component of a multi-disciplinary discharge planning process, led by the attending physician.  Recommendations may be updated based on patient status, additional functional criteria and insurance authorization.  Follow up Recommendations  (TBD)      Assistance Recommended at Discharge  (TBD)  Functional Status Assessment Patient has had a recent decline in their functional status and demonstrates the ability to make significant improvements in function in a reasonable and predictable amount of time.  Frequency and Duration min 2x/week  2 weeks       Prognosis Prognosis for Safe Diet Advancement: Good      Swallow Study   General Date of Onset: 09/07/21 HPI: Pt is a 61 y.o. male who presented to the ED with acute onset left hip pain and knee pain after a fall from his truck. Pt underwent hip surgery on 9/7. PMH: supraglottic squamous cell carcinoma (radiation 2 years PTA with PEG, but also eats p.o.), recurrent squamous cell lung cancer,  CAD, CVA, hypertension, dyslipidemia, tobacco/alcohol use. MBS 02/21/19 without penetration or aspiration. Dysphagia 1 and thin recommended due to reduced dentition. Pt seen by SLP 09/08/21-09/09/21 with recommendation for dysphagia 1 and thin liquids. SLP reconsulted 9/11 due to the arrival of pt's dentures. Type of Study: MBS-Modified Barium Swallow Study Previous Swallow Assessment:  (See HPI) Diet Prior to this Study: Dysphagia 1 (puree);Thin liquids Temperature Spikes Noted: No Respiratory Status: Nasal cannula History of Recent Intubation: Yes Length of Intubations  (days):  (several hours for surgery) Date extubated: 09/08/21 Behavior/Cognition: Cooperative;Alert;Pleasant mood Oral Cavity Assessment: Within Functional Limits Oral Care Completed by SLP: No Oral Cavity - Dentition: Dentures, top;Dentures, bottom Vision: Functional for self-feeding Self-Feeding Abilities: Able to feed self Patient Positioning: Upright in chair;Postural control adequate for testing Baseline Vocal Quality: Normal Volitional Cough: Strong;Cognitively unable to elicit Volitional Swallow: Able to elicit    Oral/Motor/Sensory Function Overall Oral Motor/Sensory Function: Within functional limits   Ice Chips Ice chips: Not tested   Thin Liquid Thin Liquid: Impaired Presentation: Straw Pharyngeal  Phase Impairments: Cough - Immediate    Nectar Thick Nectar Thick Liquid: Not tested   Honey Thick Honey Thick Liquid: Not tested   Puree Puree: Not tested (per pt's request)   Solid     Solid: Impaired Pharyngeal Phase Impairments: Cough - Immediate;Cough - Delayed     Sheala Dosh I. Hardin Negus, [place], [place] Office number [phone]  Horton Marshall 09/12/2021,12:54 PM

## 2021-09-12 NOTE — Progress Notes (Signed)
Physical Therapy Treatment Patient Details Name: ALIOU MEALEY MRN: [id number] DOB: [birth year] Today's Date: 09/12/2021   History of Present Illness 61 yo male with onset of mechanical fall from his truck, has L hip intertrochanteric fracture now WBAT after ORIF.  PMHx:  CAD, prior CVA, hypertension, dyslipidemia, prior supraglottic squamous cell carcinoma, recurrent squamous cell lung cancer, and prior tobacco and alcohol    PT Comments    Pt admitted with above diagnosis. Pt was able to stand with +3 assist and cues and did stand pivot but is limited by pain, poor posture and fatigue.   Appropriate freq for SNF placement is 3x week therefore updated pt frequency.  Pt currently with functional limitations due to balance and endurance deficits. Pt will benefit from skilled PT to increase their independence and safety with mobility to allow discharge to the venue listed below.      Recommendations for follow up therapy are one component of a multi-disciplinary discharge planning process, led by the attending physician.  Recommendations may be updated based on patient status, additional functional criteria and insurance authorization.  Follow Up Recommendations  Skilled nursing-short term rehab (<3 hours/day) Can patient physically be transported by private vehicle: No   Assistance Recommended at Discharge Frequent or constant Supervision/Assistance  Patient can return home with the following Two people to help with walking and/or transfers;A lot of help with bathing/dressing/bathroom;Assistance with cooking/housework;Direct supervision/assist for medications management;Assist for transportation;Help with stairs or ramp for entrance   Equipment Recommendations  None recommended by PT    Recommendations for Other Services       Precautions / Restrictions Precautions Precautions: Fall;Other (comment) Precaution Comments: monitor O2 sats, premedicate Restrictions Weight Bearing  Restrictions: Yes LLE Weight Bearing: Weight bearing as tolerated Other Position/Activity Restrictions: unrestricted ROM L hip and knee     Mobility  Bed Mobility Overal bed mobility: Needs Assistance Bed Mobility: Supine to Sit, Sit to Supine     Supine to sit: Mod assist, HOB elevated     General bed mobility comments: Mod A to bring trunk into upright posture. Assist for left LE intiiation of movement and to bring off bed as well with pt assisting some.    Transfers Overall transfer level: Needs assistance Equipment used: Rolling walker (2 wheels), 1 person hand held assist Transfers: Bed to chair/wheelchair/BSC Sit to Stand: Max assist, From elevated surface (+3 assist) Stand pivot transfers: Max assist, Mod assist (+3 assist)         General transfer comment: Pt was max assist of 2 to stand with first attempt with pt keeping hips, knees and trunk flexed and noted BM.  Called a 3rd person to clean pt and assist with transfer as pt leans posterior and does not stand fully upright at all times with definite need of +3 assist for stability in upright stance.  NT cleaned pt but also had hands on and then pt took pivotal steps to chair with assist for weight shifting with pt able to move left LE easier than right LE due to pain to weight shift onto right LE.  Ended up not being able to step back to chair therefore brought chair to pt.  Maximove pad in place for nurse to get pt back to bed.    Ambulation/Gait               General Gait Details: unable to attempt   Stairs             Wheelchair Mobility  Modified Rankin (Stroke Patients Only)       Balance Overall balance assessment: Needs assistance Sitting-balance support: Feet supported, Bilateral upper extremity supported Sitting balance-Leahy Scale: Poor Sitting balance - Comments: Unable to sit unsupported with posterior lean Postural control: Posterior lean (moments of control but needs constant  contact) Standing balance support: Bilateral upper extremity supported, During functional activity Standing balance-Leahy Scale: Poor Standing balance comment: Pt needing RW and +3 assist for static stance.                            Cognition Arousal/Alertness: Awake/alert Behavior During Therapy: Flat affect Overall Cognitive Status: Within Functional Limits for tasks assessed                                 General Comments: internally distracted by pain hindering ability to stay on task        Exercises General Exercises - Lower Extremity Ankle Circles/Pumps: AROM, Both, 10 reps, Supine Quad Sets: AROM, Both, 10 reps, Supine Gluteal Sets: AROM, Both, 10 reps, Supine Heel Slides: AROM, AAROM, Right, Left, 10 reps, Supine Hip ABduction/ADduction: AROM, AAROM, Right, Left, 10 reps, Supine    General Comments General comments (skin integrity, edema, etc.): VSS on 3L      Pertinent Vitals/Pain Pain Assessment Pain Assessment: 0-10 Faces Pain Scale: Hurts even more Pain Location: back, left LE Pain Descriptors / Indicators: Aching Pain Intervention(s): Limited activity within patient's tolerance, Monitored during session, Premedicated before session, Repositioned    Home Living                          Prior Function            PT Goals (current goals can now be found in the care plan section) Acute Rehab PT Goals Patient Stated Goal: home Progress towards PT goals: Progressing toward goals    Frequency    Min 3X/week      PT Plan Frequency needs to be updated    Co-evaluation              AM-PAC PT "6 Clicks" Mobility   Outcome Measure  Help needed turning from your back to your side while in a flat bed without using bedrails?: A Lot Help needed moving from lying on your back to sitting on the side of a flat bed without using bedrails?: A Lot Help needed moving to and from a bed to a chair (including a  wheelchair)?: Total Help needed standing up from a chair using your arms (e.g., wheelchair or bedside chair)?: Total Help needed to walk in hospital room?: Total Help needed climbing 3-5 steps with a railing? : Total 6 Click Score: 8    End of Session Equipment Utilized During Treatment: Gait belt;Oxygen Activity Tolerance: Patient limited by pain;Patient limited by fatigue Patient left: with call bell/phone within reach;with family/visitor present;in chair;with chair alarm set Nurse Communication: Mobility status;Need for lift equipment PT Visit Diagnosis: Unsteadiness on feet (R26.81);Muscle weakness (generalized) (M62.81);Pain;Difficulty in walking, not elsewhere classified (R26.2);History of falling (Z91.81) Pain - Right/Left: Left Pain - part of body: Leg     Time: 1007-1030 PT Time Calculation (min) (ACUTE ONLY): 23 min  Charges:  $Therapeutic Exercise: 8-22 mins $Therapeutic Activity: 8-22 mins  [facility] M,PT Acute Rehab Services [place] 09/12/2021, 1:57 PM

## 2021-09-12 NOTE — Progress Notes (Signed)
Speech Language Pathology Treatment: Dysphagia  Patient Details Name: [name] MRN: [id number] DOB: 05-07-1960 Today's Date: 09/12/2021 Time: 1445-1500 SLP Time Calculation (min) (ACUTE ONLY): 15 min  Assessment / Plan / Recommendation Clinical Impression  Pt was seen for dysphagia treatment with his wife present. Pt and his wife were educated regarding the results of the modified barium swallow study, diet recommendations, and swallowing precautions. Video recording of the study was used to facilitate education and both parties verbalized understanding regarding all areas of education. Pt's wife expressed her concern regarding pt's swallow function while pt stated "yeah, that's what the doctor told me". During education, pt was noted to consume thin liquids via straw while reclined; throat clearing was noted thereafter suggesting aspiration. Pt and his wife agreed that, considering pt's aspiration across consistencies, their preference is to prioritize the pt's pleasure and advance the pt's diet with understanding of the pt's aspiration risk. Both parties verbalized agreement and pt demonstrated understanding using teach back, but SLP suspects that full supervision will be necessary at least initially for observance of precautions. SLP will continue to follow pt.     HPI HPI: Pt is a 61 y.o. male who presented to the ED with acute onset left hip pain and knee pain after a fall from his truck. Pt underwent hip surgery on 9/7. PMH: supraglottic squamous cell carcinoma (radiation 2 years PTA with PEG, but also eats p.o.), recurrent squamous cell lung cancer,  CAD, CVA, hypertension, dyslipidemia, tobacco/alcohol use. MBS 02/21/19 without penetration or aspiration. Dysphagia 1 and thin recommended due to reduced dentition. Pt received outpatient SLP services for dysphagia February-November, 2021. Pt seen by SLP 09/08/21-09/09/21 with recommendation for dysphagia 1 and thin liquids. SLP reconsulted 9/11 due to  the arrival of pt's dentures.      SLP Plan  Continue with current plan of care      Recommendations for follow up therapy are one component of a multi-disciplinary discharge planning process, led by the attending physician.  Recommendations may be updated based on patient status, additional functional criteria and insurance authorization.    Recommendations  Diet recommendations: Dysphagia 3 (mechanical soft);Thin liquid Liquids provided via: Cup;No straw Medication Administration: Crushed with puree (or whole with puree if they cannot be crushed) Supervision: Patient able to self feed;Full supervision/cueing for compensatory strategies Compensations: Slow rate;Small sips/bites;Follow solids with liquid (left head turn) Postural Changes and/or Swallow Maneuvers: Seated upright 90 degrees;Head turn left during swallow                Oral Care Recommendations: Oral care BID Follow Up Recommendations: Skilled nursing-short term rehab (<3 hours/day) Assistance recommended at discharge:  (TBD) SLP Visit Diagnosis: Dysphagia, oropharyngeal phase (R13.12) Plan: Continue with current plan of care          [name] I. [name], [name], [name] Office number [phone]  [name]  09/12/2021, 3:18 PM

## 2021-09-13 DIAGNOSIS — C321 Malignant neoplasm of supraglottis: Secondary | ICD-10-CM | POA: Diagnosis not present

## 2021-09-13 DIAGNOSIS — Z7401 Bed confinement status: Secondary | ICD-10-CM | POA: Diagnosis not present

## 2021-09-13 DIAGNOSIS — C3490 Malignant neoplasm of unspecified part of unspecified bronchus or lung: Secondary | ICD-10-CM | POA: Diagnosis not present

## 2021-09-13 DIAGNOSIS — W19XXXA Unspecified fall, initial encounter: Secondary | ICD-10-CM | POA: Diagnosis not present

## 2021-09-13 DIAGNOSIS — K2100 Gastro-esophageal reflux disease with esophagitis, without bleeding: Secondary | ICD-10-CM | POA: Diagnosis not present

## 2021-09-13 DIAGNOSIS — S7225XD Nondisplaced subtrochanteric fracture of left femur, subsequent encounter for closed fracture with routine healing: Secondary | ICD-10-CM | POA: Diagnosis not present

## 2021-09-13 DIAGNOSIS — E785 Hyperlipidemia, unspecified: Secondary | ICD-10-CM | POA: Diagnosis not present

## 2021-09-13 DIAGNOSIS — C7802 Secondary malignant neoplasm of left lung: Secondary | ICD-10-CM | POA: Diagnosis not present

## 2021-09-13 DIAGNOSIS — Z9889 Other specified postprocedural states: Secondary | ICD-10-CM | POA: Diagnosis not present

## 2021-09-13 DIAGNOSIS — S7292XD Unspecified fracture of left femur, subsequent encounter for closed fracture with routine healing: Secondary | ICD-10-CM | POA: Diagnosis not present

## 2021-09-13 DIAGNOSIS — E871 Hypo-osmolality and hyponatremia: Secondary | ICD-10-CM | POA: Diagnosis not present

## 2021-09-13 DIAGNOSIS — M6281 Muscle weakness (generalized): Secondary | ICD-10-CM | POA: Diagnosis not present

## 2021-09-13 DIAGNOSIS — K219 Gastro-esophageal reflux disease without esophagitis: Secondary | ICD-10-CM | POA: Diagnosis not present

## 2021-09-13 DIAGNOSIS — C3411 Malignant neoplasm of upper lobe, right bronchus or lung: Secondary | ICD-10-CM | POA: Diagnosis not present

## 2021-09-13 DIAGNOSIS — S72002A Fracture of unspecified part of neck of left femur, initial encounter for closed fracture: Secondary | ICD-10-CM | POA: Diagnosis not present

## 2021-09-13 DIAGNOSIS — R1312 Dysphagia, oropharyngeal phase: Secondary | ICD-10-CM | POA: Diagnosis not present

## 2021-09-13 DIAGNOSIS — E440 Moderate protein-calorie malnutrition: Secondary | ICD-10-CM | POA: Diagnosis not present

## 2021-09-13 DIAGNOSIS — M2550 Pain in unspecified joint: Secondary | ICD-10-CM | POA: Diagnosis not present

## 2021-09-13 DIAGNOSIS — I639 Cerebral infarction, unspecified: Secondary | ICD-10-CM | POA: Diagnosis not present

## 2021-09-13 DIAGNOSIS — E039 Hypothyroidism, unspecified: Secondary | ICD-10-CM | POA: Diagnosis not present

## 2021-09-13 DIAGNOSIS — R5381 Other malaise: Secondary | ICD-10-CM | POA: Diagnosis not present

## 2021-09-13 DIAGNOSIS — D62 Acute posthemorrhagic anemia: Secondary | ICD-10-CM | POA: Diagnosis not present

## 2021-09-13 DIAGNOSIS — Z510 Encounter for antineoplastic radiation therapy: Secondary | ICD-10-CM | POA: Diagnosis not present

## 2021-09-13 DIAGNOSIS — R279 Unspecified lack of coordination: Secondary | ICD-10-CM | POA: Diagnosis not present

## 2021-09-13 DIAGNOSIS — I2510 Atherosclerotic heart disease of native coronary artery without angina pectoris: Secondary | ICD-10-CM | POA: Diagnosis not present

## 2021-09-13 DIAGNOSIS — Z8781 Personal history of (healed) traumatic fracture: Secondary | ICD-10-CM | POA: Diagnosis not present

## 2021-09-13 DIAGNOSIS — I10 Essential (primary) hypertension: Secondary | ICD-10-CM | POA: Diagnosis not present

## 2021-09-13 DIAGNOSIS — R2689 Other abnormalities of gait and mobility: Secondary | ICD-10-CM | POA: Diagnosis not present

## 2021-09-13 MED ORDER — BISACODYL 10 MG RE SUPP: 10.0000 mg | Freq: Every day | RECTAL | 0 refills | Status: DC | PRN

## 2021-09-13 MED ORDER — ONDANSETRON HCL 4 MG PO TABS: 4.0000 mg | ORAL_TABLET | Freq: Four times a day (QID) | ORAL | 0 refills | Status: DC | PRN

## 2021-09-13 MED ORDER — SENNOSIDES 8.8 MG/5ML PO SYRP: 5.0000 mL | ORAL_SOLUTION | Freq: Every day | ORAL | 0 refills | Status: DC

## 2021-09-13 MED ORDER — HYDROCODONE-ACETAMINOPHEN 10-325 MG PO TABS: 1.0000 | ORAL_TABLET | ORAL | 0 refills | Status: DC | PRN

## 2021-09-13 MED ORDER — APIXABAN 2.5 MG PO TABS: 2.5000 mg | ORAL_TABLET | Freq: Two times a day (BID) | ORAL | 0 refills | Status: DC

## 2021-09-13 MED ORDER — POLYETHYLENE GLYCOL 3350 17 G PO PACK: 17.0000 g | PACK | Freq: Every day | ORAL | 0 refills | Status: DC

## 2021-09-13 MED ORDER — DIAZEPAM 5 MG PO TABS: 5.0000 mg | ORAL_TABLET | Freq: Every day | ORAL | 0 refills | Status: DC | PRN

## 2021-09-13 NOTE — TOC Transition Note (Signed)
Transition of Care [facility]) - CM/SW Discharge Note   Patient Details  Name: [name] MRN: [id number] Date of Birth: Mar 19, 1960  Transition of Care [facility] CM/SW Contact:  Coralee Pesa, [place] Phone Number: 09/13/2021, 3:55 PM   Clinical Narrative:    Pt to be transported to [facility] via [place]. Nurse to call report to [phone]  Radiation appt transportation set up with [facility] transport.   Final next level of care: [place] Barriers to Discharge: Barriers Resolved   Patient Goals and CMS Choice        Discharge Placement              Patient chooses bed at: [facility] Patient to be transferred to facility by: [place] Name of family member notified: [name] Patient and family notified of of transfer: 09/13/21  Discharge Plan and Services                                     Social Determinants of Health (SDOH) Interventions     Readmission Risk Interventions    08/13/2019   10:13 AM 04/10/2019   12:12 PM  Readmission Risk Prevention Plan  Transportation Screening Complete Complete  Medication Review Press photographer) Complete Complete  HRI or Home Care Consult Complete Complete  Palliative Care Screening Not Applicable Not [place] Not Applicable Not Applicable

## 2021-09-13 NOTE — Progress Notes (Signed)
Speech Language Pathology Treatment: Dysphagia  Patient Details Name: [name] MRN: [id number] DOB: 24-Mar-1960 Today's Date: 09/13/2021 Time: 8875-7972 SLP Time Calculation (min) (ACUTE ONLY): 22 min  Assessment / Plan / Recommendation Clinical Impression  Reviewed MBS results with pt (Dys 3/thin with known aspiration and strategies) and he was able to recall left head turn independently however required mod verbal cues fading to minimal to implement. He has somewhat limited cervical ROM on the left but able to turn past midline. Recalled he is not supposed to use straws but not alternating liquids and solids strategy. As expected, there were immediate and delayed throat clears with cups sips thin indicative of likely penetration or aspiration. Encouraged him to continue coughing as this should assist in hopeful reduction increase likelihood of partial clearance. Pt's dentures donned and masticated Dys 3 texture without difficulty. Pt will need continued ST for implementation of swallow techniques and education.    HPI HPI: Pt is a 61 y.o. male who presented to the ED with acute onset left hip pain and knee pain after a fall from his truck. Pt underwent hip surgery on 9/7. PMH: supraglottic squamous cell carcinoma (radiation 2 years PTA with PEG, but also eats p.o.), recurrent squamous cell lung cancer,  CAD, CVA, hypertension, dyslipidemia, tobacco/alcohol use. MBS 02/21/19 without penetration or aspiration. Dysphagia 1 and thin recommended due to reduced dentition. Pt received outpatient SLP services for dysphagia February-November, 2021. Pt seen by SLP 09/08/21-09/09/21 with recommendation for dysphagia 1 and thin liquids. SLP reconsulted 9/11 due to the arrival of pt's dentures.      SLP Plan  Continue with current plan of care      Recommendations for follow up therapy are one component of a multi-disciplinary discharge planning process, led by the attending physician.  Recommendations may be  updated based on patient status, additional functional criteria and insurance authorization.    Recommendations  Diet recommendations: Dysphagia 3 (mechanical soft);Thin liquid Liquids provided via: Cup;No straw Medication Administration: Crushed with puree Supervision: Patient able to self feed;Full supervision/cueing for compensatory strategies Compensations: Other (Comment);Follow solids with liquid;Slow rate;Small sips/bites (left head turn) Postural Changes and/or Swallow Maneuvers: Seated upright 90 degrees;Head turn left during swallow                Oral Care Recommendations: Oral care BID Follow Up Recommendations: Skilled nursing-short term rehab (<3 hours/day) Assistance recommended at discharge: None SLP Visit Diagnosis: Dysphagia, oropharyngeal phase (R13.12) Plan: Continue with current plan of care           Houston Siren  09/13/2021, 10:31 AM

## 2021-09-13 NOTE — Progress Notes (Signed)
Report called to [place] at [facility]. [facility] to transport.

## 2021-09-13 NOTE — Progress Notes (Addendum)
Physical Therapy Treatment Patient Details Name: [name] MRN: [id number] DOB: [birth year] Today's Date: 09/13/2021   History of Present Illness 61 yo male with onset of mechanical fall from his truck, has L hip intertrochanteric fracture now WBAT after ORIF.  PMHx:  CAD, prior CVA, hypertension, dyslipidemia, prior supraglottic squamous cell carcinoma, recurrent squamous cell lung cancer, and prior tobacco and alcohol    PT Comments    Pt with regression towards his physical therapy goals today; question related to recent IV pain medication use. Pt with overall decreased command following and problem solving, thus requiring increased assist for all aspects of mobility. Attempted to utilize Denna Haggard to stand and weightbear through BLE's. Pt able to clear hips, but not able to achieve an adequate upright posture in order to utilize. Returned to supine to provide peri care with NT assist. Continue to recommend SNF for ongoing Physical Therapy.      Recommendations for follow up therapy are one component of a multi-disciplinary discharge planning process, led by the attending physician.  Recommendations may be updated based on patient status, additional functional criteria and insurance authorization.  Follow Up Recommendations  Skilled nursing-short term rehab (<3 hours/day) Can patient physically be transported by private vehicle: No   Assistance Recommended at Discharge Frequent or constant Supervision/Assistance  Patient can return home with the following Two people to help with walking and/or transfers;A lot of help with bathing/dressing/bathroom;Assistance with cooking/housework;Direct supervision/assist for medications management;Assist for transportation;Help with stairs or ramp for entrance   Equipment Recommendations  None recommended by PT    Recommendations for Other Services       Precautions / Restrictions Precautions Precautions: Fall Restrictions Weight Bearing  Restrictions: Yes LLE Weight Bearing: Weight bearing as tolerated Other Position/Activity Restrictions: unrestricted ROM L hip and knee     Mobility  Bed Mobility Overal bed mobility: Needs Assistance Bed Mobility: Supine to Sit, Sit to Supine, Rolling Rolling: Max assist, +2 for physical assistance   Supine to sit: HOB elevated, Max assist, +2 for physical assistance Sit to supine: Max assist, +2 for physical assistance   General bed mobility comments: Assist for BLE's and trunk. Rolling for peri care to R/L    Transfers Overall transfer level: Needs assistance Equipment used: Ambulation equipment used Transfers: Sit to/from Stand Sit to Stand: Max assist, +2 physical assistance, From elevated surface           General transfer comment: Pt unable to stand upright with Stedy from elevated bed height despite maxA + 2 and use of bed pad to attempt to achieve adequate glute activation. Multimodal cues utilized for quad/core activation and upper trunk control, however, pt with significantly crouched posture and not bearing weight through either of his BLE's and unable to perform peri care or fold flaps underneath to sit. pt did not appear to be following commands    Ambulation/Gait               General Gait Details: unable to attempt   Stairs             Wheelchair Mobility    Modified Rankin (Stroke Patients Only)       Balance Overall balance assessment: Needs assistance Sitting-balance support: Feet supported, Bilateral upper extremity supported Sitting balance-Leahy Scale: Poor Sitting balance - Comments: Unable to sit unsupported with posterior lean Postural control: Posterior lean (moments of control but needs constant contact)  Cognition Arousal/Alertness: Awake/alert Behavior During Therapy: Flat affect Overall Cognitive Status: Impaired/Different from baseline Area of Impairment: Attention,  Following commands, Problem solving                   Current Attention Level: Sustained   Following Commands: Follows one step commands inconsistently     Problem Solving: Decreased initiation, Requires verbal cues General Comments: ? impaired cognition due to recent IV medication. Pt with poor command following, attention, and problem solving.        Exercises      General Comments        Pertinent Vitals/Pain Pain Assessment Pain Assessment: Faces Faces Pain Scale: Hurts even more Pain Location: left LE Pain Descriptors / Indicators: Aching Pain Intervention(s): Limited activity within patient's tolerance, Monitored during session    Home Living                          Prior Function            PT Goals (current goals can now be found in the care plan section) Acute Rehab PT Goals Patient Stated Goal: home Potential to Achieve Goals: Fair Progress towards PT goals: Not progressing toward goals - comment    Frequency    Min 3X/week      PT Plan Current plan remains appropriate    Co-evaluation              AM-PAC PT "6 Clicks" Mobility   Outcome Measure  Help needed turning from your back to your side while in a flat bed without using bedrails?: Total Help needed moving from lying on your back to sitting on the side of a flat bed without using bedrails?: Total Help needed moving to and from a bed to a chair (including a wheelchair)?: Total Help needed standing up from a chair using your arms (e.g., wheelchair or bedside chair)?: Total Help needed to walk in hospital room?: Total Help needed climbing 3-5 steps with a railing? : Total 6 Click Score: 6    End of Session Equipment Utilized During Treatment: Gait belt Activity Tolerance: Patient limited by pain;Patient limited by fatigue Patient left: with call bell/phone within reach;with family/visitor present;in bed;with bed alarm set;with restraints reapplied Nurse  Communication: Mobility status;Need for lift equipment PT Visit Diagnosis: Unsteadiness on feet (R26.81);Muscle weakness (generalized) (M62.81);Pain;Difficulty in walking, not elsewhere classified (R26.2);History of falling (Z91.81) Pain - Right/Left: Left Pain - part of body: Leg     Time: 1610-9604 PT Time Calculation (min) (ACUTE ONLY): 38 min  Charges:  $Therapeutic Activity: 38-52 mins                     Wyona Almas, PT, DPT Acute Rehabilitation Services Office [phone]    Deno Etienne 09/13/2021, 2:09 PM

## 2021-09-13 NOTE — Discharge Summary (Signed)
Physician Discharge Summary  [name] QZR:[id number] DOB: 04-14-1960 DOA: 09/07/2021  PCP: [name], MD  Admit date: 09/07/2021 Discharge date: 09/13/2021  Admitted From: Home Disposition: Skilled nursing facility   Recommendations for Outpatient Follow-up:  Follow up with PCP in 1-2 weeks Please obtain BMP/CBC in one week Please follow up with Ortho Follow-up for radiation therapy for squamous cell lung cancer   Home Health: None none Equipment/Devices: None  Discharge Condition: Stable  CODE STATUS: Full code Diet recommendation: Dysphagia 3 diet mechanical soft heart healthy Brief/Interim Summary:[name] is a 61 y.o. male with medical history significant for CAD, prior CVA, hypertension, dyslipidemia, prior supraglottic squamous cell carcinoma, recurrent squamous cell lung cancer, and prior tobacco and alcohol use who presented to the ED with acute onset left hip pain and knee pain after a fall from his truck earlier this morning.  He denies any loss of consciousness or dizziness prior to the episode and states that he simply twisted around his cane getting out of his truck which led to the fall.  He denies any head injury and was not able to ambulate secondary to the pain.  He denies any numbness, tingling, back pain, neck pain, chest pain, shortness of breath, abdominal pain, nausea, or vomiting.  No fevers or chills noted.   ED Course: Stable vital signs noted with mild leukocytosis of 10,700.  Hemoglobin 12.7 and sodium 129.  Left proximal, nondisplaced femur fracture noted and EDP has discussed with orthopedics Dr. Amedeo Kinsman regarding the need for operative intervention.  Discharge Diagnoses:  Principal Problem:   Hip fracture [facility]) Active Problems:   Hypertension   Hyponatremia   Coronary artery disease   Stroke [facility])   Head and neck cancer (HCC)  #1 status post fall with acute left proximal femur fracture status post intramedullary nailing of the left hip  09/08/2021 Dr. Marcelino Scot.  Eliquis 2.5 twice daily for 30 days on discharge for DVT prophylaxis.  Continue oxycodone for pain control. Valium should only be given at night as needed.  If he gets Valium during the day patient becomes very weak and will not be able to walk or participate in therapy.    #2 history of essential hypertension blood pressure 146/78 continue Norvasc and Lopressor.    #3 hyponatremia sodium improved 131 TSH 2.1   #4 hyperlipidemia on Lipitor   #5 hypothyroidism continue Synthroid normal TSH   #6 history of stroke on aspirin and statin Because he started on Eliquis for the hip fracture aspirin was stopped on discharge.  Restart aspirin when Eliquis is stopped.   #7 history of anxiety depression on Cymbalta and Valium Give Valium 5 mg nightly as needed give only at night as patient may feel very drowsy and weak if you give it to him during the day and will not be able to participate in PT   #8 seizure disorder on valproic acid   #9 squamous cell lung CA outpatient follow-up Continue radiation as an outpatient.   #10 history of supraglottic cancer seen by speech therapy patient is mild aspiration risk on dysphagia 3 diet   #11 GERD on PPI   #12 acute blood loss anemia.  Hemoglobin 8.9 on the day of discharge stable.    Estimated body mass index is 21.61 kg/m as calculated from the following:   Height as of this encounter: 6\' 1"  (1.854 m).   Weight as of this encounter: 74.3 kg.  Discharge Instructions  Discharge Instructions  Diet - low sodium heart healthy   Complete by: As directed    Increase activity slowly   Complete by: As directed    No wound care   Complete by: As directed       Allergies as of 09/13/2021       Reactions   Cymbalta [duloxetine Hcl]    Duloxetine Other (See Comments)   Caused depression/ patient is taking   Prednisone Other (See Comments)   Chest pain        Medication List     STOP taking these medications     amLODipine 5 MG tablet Commonly known as: NORVASC   aspirin EC 81 MG tablet   dronabinol 5 MG capsule Commonly known as: MARINOL   lactulose 10 GM/15ML solution Commonly known as: CHRONULAC   potassium chloride SA 20 MEQ tablet Commonly known as: KLOR-CON M       TAKE these medications    acetaminophen 325 MG tablet Commonly known as: TYLENOL Take 1.5 tablets (487.5 mg total) by mouth every 6 (six) hours as needed for mild pain, fever or headache (or Fever >/= 101).   albuterol 108 (90 Base) MCG/ACT inhaler Commonly known as: VENTOLIN HFA Inhale 1-2 puffs into the lungs every 4 (four) hours as needed for wheezing or shortness of breath.   apixaban 2.5 MG Tabs tablet Commonly known as: Eliquis Take 1 tablet (2.5 mg total) by mouth 2 (two) times daily.   atorvastatin 40 MG tablet Commonly known as: LIPITOR Take 40 mg by mouth at bedtime.   Baclofen 5 MG Tabs Take 10 mg by mouth at bedtime. What changed: how much to take   bisacodyl 10 MG suppository Commonly known as: DULCOLAX Place 1 suppository (10 mg total) rectally daily as needed for moderate constipation.   diazepam 5 MG tablet Commonly known as: VALIUM Take 1 tablet (5 mg total) by mouth daily as needed for anxiety. What changed:  medication strength how much to take when to take this reasons to take this additional instructions   docusate sodium 100 MG capsule Commonly known as: COLACE Take 100 mg by mouth 2 (two) times daily.   DULoxetine 30 MG capsule Commonly known as: CYMBALTA Take 30 mg by mouth daily.   HYDROcodone-acetaminophen 10-325 MG tablet Commonly known as: NORCO Take 1 tablet by mouth every 4 (four) hours as needed. What changed: reasons to take this   levothyroxine 50 MCG tablet Commonly known as: SYNTHROID Take 50 mcg by mouth daily.   metoprolol tartrate 25 MG tablet Commonly known as: LOPRESSOR Take 12.5 mg by mouth 2 (two) times daily.   nitroGLYCERIN 0.4 MG SL  tablet Commonly known as: NITROSTAT Place 1 tablet (0.4 mg total) under the tongue every 5 (five) minutes as needed for chest pain (CP or SOB).   omeprazole 40 MG capsule Commonly known as: PRILOSEC TAKE 1 CAPSULE(40 MG) BY MOUTH DAILY What changed: See the new instructions.   ondansetron 4 MG tablet Commonly known as: ZOFRAN Take 1 tablet (4 mg total) by mouth every 6 (six) hours as needed for nausea.   polyethylene glycol 17 g packet Commonly known as: MIRALAX / GLYCOLAX Take 17 g by mouth daily. Start taking on: September 14, 2021   sennosides 8.8 MG/5ML syrup Commonly known as: SENOKOT Take 5 mLs by mouth at bedtime.   valproic acid 250 MG/5ML solution Commonly known as: DEPAKENE TAKE 10 ML BY MOUTH THREE TIMES DAILY What changed:  how much to take how  to take this when to take this additional instructions        Contact information for after-discharge care     [facility] .   Service: Skilled Nursing Contact information: [address] [address] [phone]                    Allergies  Allergen Reactions   Cymbalta [Duloxetine Hcl]    Duloxetine Other (See Comments)    Caused depression/ patient is taking   Prednisone Other (See Comments)    Chest pain    Consultations: Ortho   Procedures/Studies: DG Swallowing Func-Speech Pathology  Result Date: 09/12/2021 Table formatting from the original result was not included. Objective Swallowing Evaluation: Type of Study: MBS-Modified Barium Swallow Study  Patient Details Name: KAITO SCHULENBURG MRN: [id number] Date of Birth: 24-Jul-1960 Today's Date: 09/12/2021 Time: SLP Start Time (ACUTE ONLY): 1330 -SLP Stop Time (ACUTE ONLY): 1350 SLP Time Calculation (min) (ACUTE ONLY): 20 min Past Medical History: Past Medical History: Diagnosis Date  Anemia   Anxiety   Arthritis   Cancer of larynx (HCC)   Carotid artery stenosis    Chronic lower back pain   Coronary artery disease   a. Diag cath 10/2012 for CP/abnormal nuc -> PCI s/p LAD atherectomy/DES placement 11/05/12.  Depression   Dilated aortic root (HCC)   GERD (gastroesophageal reflux disease)   History of hiatal hernia   Hypercholesteremia   Hypertension   Lung cancer ([place])   Pneumonia   S/P percutaneous endoscopic gastrostomy (PEG) tube placement [facility])   Stroke ([place]) 07/31/2018  Pons Past Surgical History: Past Surgical History: Procedure Laterality Date  BIOPSY  11/24/2019  Procedure: BIOPSY;  Surgeon: Irving Copas., MD;  Location: [place];  Service: Gastroenterology;;  BIOPSY  03/29/2020  Procedure: BIOPSY;  Surgeon: Irving Copas., MD;  Location: [facility];  Service: Gastroenterology;;  BRONCHIAL BIOPSY  07/26/2020  Procedure: BRONCHIAL BIOPSIES;  Surgeon: Garner Nash, DO;  Location: [place] [facility];  Service: Pulmonary;;  BRONCHIAL BRUSHINGS  07/26/2020  Procedure: BRONCHIAL BRUSHINGS;  Surgeon: Garner Nash, DO;  Location: [place] [facility];  Service: Pulmonary;;  BRONCHIAL NEEDLE ASPIRATION BIOPSY  07/26/2020  Procedure: BRONCHIAL NEEDLE ASPIRATION BIOPSIES;  Surgeon: Garner Nash, DO;  Location: [place];  Service: Pulmonary;;  CARDIAC CATHETERIZATION  10/29/2012  COLONOSCOPY WITH PROPOFOL N/A 11/24/2019  Procedure: COLONOSCOPY WITH PROPOFOL;  Surgeon: Irving Copas., MD;  Location: [place];  Service: Gastroenterology;  Laterality: N/A;  CORONARY ANGIOPLASTY WITH STENT PLACEMENT  11/05/2012  DIRECT LARYNGOSCOPY N/A 01/02/2019  Procedure: MICRO DIRECT LARYNGOSCOPY BIOPSY OF LARYNGEAL MASS;  Surgeon: [name], MD;  Location: [place];  Service: ENT;  Laterality: N/A;  ESOPHAGOGASTRODUODENOSCOPY (EGD) WITH PROPOFOL N/A 08/11/2019  Procedure: ATTEMPTED ESOPHAGOGASTRODUODENOSCOPY (EGD) WITH PROPOFOL (UNABLE TO PERFORM PERCUTANEOUS ENDOSCOPIC GASTROSTOMY TUBE REPLACEMENT);  Surgeon: [name], MD;  Location: [facility];  Service:  Gastroenterology;  Laterality: N/A;  ESOPHAGOGASTRODUODENOSCOPY (EGD) WITH PROPOFOL N/A 11/24/2019  Procedure: ESOPHAGOGASTRODUODENOSCOPY (EGD) WITH PROPOFOL;  Surgeon: Rush Landmark Telford Nab., MD;  Location: [place];  Service: Gastroenterology;  Laterality: N/A;  ESOPHAGOGASTRODUODENOSCOPY (EGD) WITH PROPOFOL N/A 01/26/2020  Procedure: ESOPHAGOGASTRODUODENOSCOPY (EGD) WITH PROPOFOL;  Surgeon: Rush Landmark Telford Nab., MD;  Location: [place];  Service: Gastroenterology;  Laterality: N/A;  ESOPHAGOGASTRODUODENOSCOPY (EGD) WITH PROPOFOL N/A 03/29/2020  Procedure: ESOPHAGOGASTRODUODENOSCOPY (EGD) WITH PROPOFOL;  Surgeon: Rush Landmark Telford Nab., MD;  Location: [facility];  Service: Gastroenterology;  Laterality: N/A;  NEEDS FLOURO  FIDUCIAL MARKER PLACEMENT  07/26/2020  Procedure: FIDUCIAL MARKER PLACEMENT;  Surgeon: Garner Nash, DO;  Location: [place] [facility];  Service: Pulmonary;;  HEMORRHOID SURGERY  ~ 2010  INSERTION OF MESH N/A 06/02/2015  Procedure: INSERTION OF MESH;  Surgeon: [name], MD;  [facility];  Service: General;  Laterality: N/A;  INTRAMEDULLARY (IM) NAIL INTERTROCHANTERIC Left 09/08/2021  Procedure: INTRAMEDULLARY NAILING OF LEFT FEMUR;  Surgeon: Altamese Woodbridge, MD;  Location: [place];  Service: Orthopedics;  Laterality: Left;  IR ANGIO INTRA EXTRACRAN SEL COM CAROTID INNOMINATE BILAT MOD SED  08/02/2018  IR ANGIO VERTEBRAL SEL VERTEBRAL BILAT MOD SED  08/02/2018  IR REPLACE G-TUBE SIMPLE WO FLUORO  08/12/2019  IR REPLC GASTRO/COLONIC TUBE PERCUT W/FLUORO  08/13/2019  LAPAROSCOPIC INSERTION GASTROSTOMY TUBE N/A 02/24/2019  Procedure: LAPAROSCOPIC INSERTION GASTROSTOMY TUBE;  Surgeon: [name], MD;  Location: [place];  Service: General;  Laterality: N/A;  LEFT HEART CATHETERIZATION WITH CORONARY ANGIOGRAM N/A 10/30/2012  Procedure: LEFT HEART CATHETERIZATION WITH CORONARY ANGIOGRAM;  Surgeon: [name], MD;  Location: [place] CATH LAB;  Service: Cardiovascular;  Laterality: N/A;  MULTIPLE  EXTRACTIONS WITH ALVEOLOPLASTY N/A 02/18/2019  Procedure: Extraction of tooth #'s 5-7, 10,11,14,20-29, 31 and 32 with alveoloplasty and maxiillary left buccal exostoses reductions;  Surgeon: [name], [name];  Location: [place];  Service: Oral Surgery;  Laterality: N/A;  PEG PLACEMENT N/A 01/26/2020  Procedure: PERCUTANEOUS ENDOSCOPIC GASTROSTOMY (PEG) REPLACEMENT;  Surgeon: Irving Copas., MD;  Location: [place];  Service: Gastroenterology;  Laterality: N/A;  PERCUTANEOUS CORONARY ROTOBLATOR INTERVENTION (PCI-R) N/A 11/05/2012  Procedure: PERCUTANEOUS CORONARY ROTOBLATOR INTERVENTION (PCI-R);  Surgeon: [name], MD;  Location: [facility] CATH LAB;  Service: Cardiovascular;  Laterality: N/A;  SAVORY DILATION N/A 11/24/2019  Procedure: SAVORY DILATION;  Surgeon: Rush Landmark Telford Nab., MD;  Location: [place];  Service: Gastroenterology;  Laterality: N/A;  SAVORY DILATION N/A 01/26/2020  Procedure: SAVORY DILATION;  Surgeon: Rush Landmark Telford Nab., MD;  Location: [place];  Service: Gastroenterology;  Laterality: N/A;  TRACHEOSTOMY TUBE PLACEMENT N/A 02/18/2019  Procedure: Tracheostomy;  Surgeon: [name], MD;  Location: [place];  Service: ENT;  Laterality: N/A;  UMBILICAL HERNIA REPAIR N/A 06/02/2015  Procedure: UMBILICAL HERNIORRHAPHY WITH MESH;  Surgeon: [name], MD;  [facility];  Service: General;  Laterality: N/A;  VIDEO BRONCHOSCOPY WITH ENDOBRONCHIAL NAVIGATION Bilateral 07/26/2020  Procedure: VIDEO BRONCHOSCOPY WITH ENDOBRONCHIAL NAVIGATION;  Surgeon: Garner Nash, DO;  Location: [place];  Service: Pulmonary;  Laterality: Bilateral;  ION  VIDEO BRONCHOSCOPY WITH RADIAL ENDOBRONCHIAL ULTRASOUND  07/26/2020  Procedure: VIDEO BRONCHOSCOPY WITH RADIAL ENDOBRONCHIAL ULTRASOUND;  Surgeon: Garner Nash, DO;  Location[facility];  Service: Pulmonary;; HPI: Pt is a 61 y.o. male who presented to the ED with acute onset left hip pain and knee pain after a fall from his  truck. Pt underwent hip surgery on 9/7. PMH: supraglottic squamous cell carcinoma (radiation 2 years PTA with PEG, but also eats p.o.), recurrent squamous cell lung cancer,  CAD, CVA, hypertension, dyslipidemia, tobacco/alcohol use. MBS 02/21/19 without penetration or aspiration. Dysphagia 1 and thin recommended due to reduced dentition. Pt received outpatient SLP services for dysphagia February-November, 2021. Pt seen by SLP 09/08/21-09/09/21 with recommendation for dysphagia 1 and thin liquids. SLP reconsulted 9/11 due to the arrival of pt's dentures.  No data recorded  Recommendations for follow up therapy are one component of a multi-disciplinary discharge planning process, led by the attending physician.  Recommendations may be updated based on patient  status, additional functional criteria and insurance authorization. Assessment / Plan / Recommendation   09/12/2021   1:23 PM Clinical Impressions Clinical Impression Pt presents with iatrogenic oropharyngeal dysphagia s/p radiation treatment for laryngeal cancer. Pt's dysphagia was characterized by reduction in tongue base retraction, hyolaryngeal elevation, anterior laryngeal movement, pharyngeal constriction, and PES distention. He exhibited weak lingual manipulation, lingual pumping, as well as base of tongue, vallecular, and pyriform sinus residue. Residue was reduced with liquid washes and secondary swallows. Complete epiglottic inversion was never achieved despite postural modifications or prompted effortful swallows. Penetration (PAS 3) was consistently noted during the swallow due to reduced laryngeal closure and aspiration (PAS 7) was noted thereafter due to pharyngeal residue. This occured across solid and liquid (i.e., thin, nectar, honey thick) consistencies. With purees and liquids, aspiration was more immediate than with more advanced solids. No functional benefit was observed with a chin tuck posture or with prompted effortful swallows in reducing  residue or laryngeal invasion. A left head turn was effective in reducing the amount of laryngeal invasion. Pt's cough was often effective in expelling smaller amounts of aspirated material just below the vocal folds, but the strength of pt's swallow was often inadequate to clear this material from the pharnx and it subsequently fell into his larynx once more. Pt expressed that current priority is his pleasure and that he understands that has been told in the past by MDs that he will always aspirate. Based on conversation with pt, a dysphagia 3 diet with thin liquids will be initiated with observance of swallowing precautions to reduce risk of dysphagia-related adverse events. SLP will follow up later today for further discussion with pt and his wife. SLP Visit Diagnosis Dysphagia, oropharyngeal phase (R13.12) Impact on safety and function Mild aspiration risk;Moderate aspiration risk     09/12/2021   1:23 PM Treatment Recommendations Treatment Recommendations Therapy as outlined in treatment plan below     09/12/2021   1:23 PM Prognosis Prognosis for Safe Diet Advancement Good Barriers to Reach Goals Time post onset;Severity of deficits   09/12/2021   1:23 PM Diet Recommendations SLP Diet Recommendations Dysphagia 3 (Mech soft) solids;Thin liquid Liquid Administration via Cup;No straw Medication Administration Whole meds with puree Compensations Slow rate;Small sips/bites;Follow solids with liquid Postural Changes Seated upright at 90 degrees     09/12/2021   1:23 PM Other Recommendations Oral Care Recommendations Oral care BID Follow Up Recommendations Skilled nursing-short term rehab (<3 hours/day) Functional Status Assessment Patient has not had a recent decline in their functional status   09/12/2021   1:23 PM Frequency and Duration  Speech Therapy Frequency (ACUTE ONLY) min 2x/week Treatment Duration 2 weeks     09/12/2021   1:23 PM Oral Phase Oral Phase Impaired Oral - Nectar Cup Delayed oral transit;Weak lingual  manipulation;Lingual pumping Oral - Thin Cup Delayed oral transit;Weak lingual manipulation;Lingual pumping Oral - Puree Delayed oral transit;Weak lingual manipulation;Lingual pumping Oral - Regular Delayed oral transit;Weak lingual manipulation;Lingual pumping    09/12/2021   1:23 PM Pharyngeal Phase Pharyngeal Phase Impaired Pharyngeal- Honey Cup Reduced epiglottic inversion;Reduced pharyngeal peristalsis;Reduced anterior laryngeal mobility;Reduced laryngeal elevation;Reduced airway/laryngeal closure;Reduced tongue base retraction;Penetration/Aspiration during swallow;Penetration/Apiration after swallow;Moderate aspiration;Pharyngeal residue - valleculae;Pharyngeal residue - pyriform Pharyngeal Material enters airway, remains ABOVE vocal cords and not ejected out;Material enters airway, passes BELOW cords and not ejected out despite cough attempt by patient Pharyngeal- Nectar Cup Reduced epiglottic inversion;Reduced pharyngeal peristalsis;Reduced anterior laryngeal mobility;Reduced laryngeal elevation;Reduced airway/laryngeal closure;Reduced tongue base retraction;Penetration/Aspiration during swallow;Penetration/Apiration after swallow;Moderate aspiration;Pharyngeal  residue - valleculae;Pharyngeal residue - pyriform Pharyngeal Material enters airway, remains ABOVE vocal cords and not ejected out;Material enters airway, passes BELOW cords and not ejected out despite cough attempt by patient Pharyngeal- Thin Cup Reduced epiglottic inversion;Reduced pharyngeal peristalsis;Reduced anterior laryngeal mobility;Reduced laryngeal elevation;Reduced airway/laryngeal closure;Reduced tongue base retraction;Penetration/Aspiration during swallow;Penetration/Apiration after swallow;Moderate aspiration;Pharyngeal residue - valleculae;Pharyngeal residue - pyriform Pharyngeal Material enters airway, remains ABOVE vocal cords and not ejected out;Material enters airway, passes BELOW cords and not ejected out despite cough attempt by  patient Pharyngeal- Puree Reduced epiglottic inversion;Reduced pharyngeal peristalsis;Reduced anterior laryngeal mobility;Reduced laryngeal elevation;Reduced airway/laryngeal closure;Reduced tongue base retraction;Penetration/Aspiration during swallow;Penetration/Apiration after swallow;Moderate aspiration;Pharyngeal residue - valleculae;Pharyngeal residue - pyriform Pharyngeal Material enters airway, remains ABOVE vocal cords and not ejected out;Material enters airway, passes BELOW cords and not ejected out despite cough attempt by patient Pharyngeal- Regular Reduced epiglottic inversion;Reduced pharyngeal peristalsis;Reduced anterior laryngeal mobility;Reduced laryngeal elevation;Reduced airway/laryngeal closure;Reduced tongue base retraction;Penetration/Aspiration during swallow;Penetration/Apiration after swallow;Moderate aspiration;Pharyngeal residue - valleculae;Pharyngeal residue - pyriform Pharyngeal Material enters airway, remains ABOVE vocal cords and not ejected out;Material enters airway, passes BELOW cords and not ejected out despite cough attempt by patient    09/12/2021   1:23 PM Cervical Esophageal Phase  Cervical Esophageal Phase Impaired Honey Cup Reduced cricopharyngeal relaxation Nectar Cup Reduced cricopharyngeal relaxation Thin Cup Reduced cricopharyngeal relaxation Puree Reduced cricopharyngeal relaxation Regular Reduced cricopharyngeal relaxation Shanika I. Hardin Negus, [place], [place] Office number [phone] Horton Marshall 09/12/2021, 3:06 PM                     DG FEMUR PORT MIN 2 VIEWS LEFT  Result Date: 09/08/2021 CLINICAL DATA:  Postop left femur EXAM: LEFT FEMUR PORTABLE 2 VIEWS COMPARISON:  Left hip radiographs 09/07/2021 FINDINGS: Interval left intramedullary nail fixation of the previously seen left intertrochanteric fracture with interlocking femoral neck screw and transverse distal interlocking screw. Normal alignment of the dominant and trochanteric  fracture line. Mild superior displacement of the fractured lesser trochanter is again seen. No perihardware lucency is seen to indicate hardware failure or loosening. Mild-to-moderate superolateral left acetabular degenerative osteophytosis. Mild medial compartment of the knee joint space narrowing. No knee joint effusion. Moderate to high-grade vascular calcifications. IMPRESSION: Interval ORIF of the left femoral intertrochanteric fracture with improved alignment. No evidence of hardware failure. Electronically Signed   By: Yvonne Kendall M.D.   On: 09/08/2021 11:32   DG FEMUR MIN 2 VIEWS LEFT  Result Date: 09/08/2021 CLINICAL DATA:  Left femur ORIF EXAM: LEFT FEMUR 2 VIEWS COMPARISON:  09/07/2021 FINDINGS: Six C-arm fluoroscopic images were obtained intraoperatively and submitted for post operative interpretation. Images demonstrate placement of ORIF hardware traversing left intertrochanteric femur fracture. 1 minute 15 seconds fluoroscopy time utilized. Radiation dose: 16.38 mGy. Please see the performing provider's procedural report for further detail. IMPRESSION: Intraoperative ORIF left femur fracture. Electronically Signed   By: Davina Poke D.O.   On: 09/08/2021 10:53   DG C-Arm 1-60 Min-No Report  Result Date: 09/08/2021 Fluoroscopy was utilized by the requesting physician.  No radiographic interpretation.   DG C-Arm 1-60 Min-No Report  Result Date: 09/08/2021 Fluoroscopy was utilized by the requesting physician.  No radiographic interpretation.   DG Knee Complete 4 Views Left  Result Date: 09/07/2021 CLINICAL DATA:  Fall EXAM: LEFT KNEE - COMPLETE 4+ VIEW COMPARISON:  None Available. FINDINGS: There is no acute fracture or dislocation. Alignment is normal. The joint spaces are preserved. There is no erosive change. There is no effusion. Dense  vascular calcifications are noted. IMPRESSION: No acute fracture or dislocation. Electronically Signed   By: Valetta Mole M.D.   On: 09/07/2021 11:42    DG Hip Unilat W or Wo Pelvis 2-3 Views Left  Result Date: 09/07/2021 CLINICAL DATA:  Fall.  Hip pain. EXAM: DG HIP (WITH OR WITHOUT PELVIS) 2-3V LEFT COMPARISON:  None Available. FINDINGS: Nondisplaced, intertrochanteric fracture of the left proximal femur, with no significant comminution, apex anterior angulation but no significant valgus or varus angulation. No other fractures.  No bone lesions. Hip joints, SI joints and symphysis pubis are normally aligned. Dense atherosclerotic vascular calcifications. IMPRESSION: 1. Nondisplaced left proximal femur intertrochanteric fracture, without significant comminution. Apex anterior angulation. Electronically Signed   By: Lajean Manes M.D.   On: 09/07/2021 11:39   NM PET Image Restag (PS) Skull Base To Thigh  Result Date: 08/27/2021 CLINICAL DATA:  Subsequent treatment strategy for history of head neck cancer with new diagnosis of left lung cancer. Recent lung biopsy. EXAM: NUCLEAR MEDICINE PET SKULL BASE TO THIGH TECHNIQUE: 8.51 mCi F-18 FDG was injected intravenously. Full-ring PET imaging was performed from the skull base to thigh after the radiotracer. CT data was obtained and used for attenuation correction and anatomic localization. Fasting blood glucose: 93 mg/dl COMPARISON:  PET-CT 05/11/2021 FINDINGS: Mediastinal blood pool activity: SUV max 2.26 Liver activity: SUV max NA NECK: Persistent hypermetabolism the region of the left piriform sinus with SUV max of 9.66. This was previously 9.29. No enlarged or hypermetabolic neck nodes. Moderate but symmetric uptake in the tongue and other head and neck muscles. Incidental CT findings: None. CHEST: Interval enlargement of the left lower lobe pulmonary mass. Measures 3.9 x 3.1 cm and previously measured 2.5 x 1.8 cm. SUV max is 10.7 and was previously 9.06. No hypermetabolic mediastinal or hilar lymph nodes are identified. Superior segment right lower lobe pulmonary nodule measures 10 mm image 37/9. This  previously measured 8 mm. It also appears more solid. SUV max is 2.80 and was previously 2.77. No new pulmonary lesions. Incidental CT findings: Stable atherosclerotic calcifications involving the aorta and coronary arteries. Stable emphysematous changes and pulmonary scarring. ABDOMEN/PELVIS: No abnormal hypermetabolic activity within the liver, pancreas, adrenal glands, or spleen. No hypermetabolic lymph nodes in the abdomen or pelvis. Incidental CT findings: Stable vascular calcifications and stable 3 cm infrarenal abdominal aortic aneurysm. Recommend follow-up ultrasound every 3 years. This recommendation follows ACR consensus guidelines: White Paper of the ACR Incidental Findings Committee II on Vascular Findings. J Am Coll Radiol 2013; 10:789-794. Moderately thick-walled bladder possibly due to bladder outlet obstruction. This is a stable finding. SKELETON: No findings suspicious for osseous metastatic disease. Incidental CT findings: None. IMPRESSION: 1. Persistent focus of hypermetabolism in the region of the left piriform sinus but no new or progressive findings or cervical adenopathy. 2. Enlarging left lower lobe lung mass with relatively stable hypermetabolism. 3. Enlarging superior segment right lower lobe pulmonary nodule with stable low level hypermetabolism. 4. No mediastinal or hilar adenopathy or new pulmonary lesions. 5. No findings for abdominal/pelvic or osseous metastatic disease. Electronically Signed   By: Marijo Sanes M.D.   On: 08/27/2021 11:07   (Echo, Carotid, EGD, Colonoscopy, ERCP)    Subjective: Patient resting no new complaints  Discharge Exam: Vitals:   09/13/21 0333 09/13/21 0924  BP: (!) 90/56 119/62  Pulse: (!) 58 64  Resp: 17 17  Temp: 98.4 F (36.9 C) 97.9 F (36.6 C)  SpO2: (!) 87% 90%   Vitals:  09/12/21 2036 09/12/21 2038 09/13/21 0333 09/13/21 0924  BP: 102/67 102/67 (!) 90/56 119/62  Pulse: 69 69 (!) 58 64  Resp: 18 18 17 17   Temp: 98.2 F (36.8 C)  98.2 F (36.8 C) 98.4 F (36.9 C) 97.9 F (36.6 C)  TempSrc:  Oral  Oral  SpO2: 90% 90% (!) 87% 90%  Weight:      Height:        General: Pt is alert, awake, not in acute distress Cardiovascular: RRR, S1/S2 +, no rubs, no gallops Respiratory: CTA bilaterally, no wheezing, no rhonchi Abdominal: Soft, NT, ND, bowel sounds + Extremities: Left hip incision covered with dressing clean dry and intact   The results of significant diagnostics from this hospitalization (including imaging, microbiology, ancillary and laboratory) are listed below for reference.     Microbiology: Recent Results (from the past 240 hour(s))  Surgical pcr screen     Status: None   Collection Time: 09/07/21 11:23 PM   Specimen: Nasal Mucosa; Nasal Swab  Result Value Ref Range Status   MRSA, PCR NEGATIVE NEGATIVE Final   Staphylococcus aureus NEGATIVE NEGATIVE Final    Comment: (NOTE) The Xpert SA Assay (FDA approved for NASAL specimens in patients 41 years of age and older), is one component of a comprehensive surveillance program. It is not intended to diagnose infection nor to guide or monitor treatment. Performed at [facility], [place] [address] [place], [place] [address]      Labs: BNP (last 3 results) No results for input(s): "BNP" in the last 8760 hours. Basic Metabolic Panel: Recent Labs  Lab 09/07/21 1152 09/08/21 0037 09/09/21 0748 09/10/21 0054 09/11/21 0030  NA 129* 130* 134* 131* 130*  K 4.0 3.8 3.8 3.8 3.7  CL 92* 98 95* 96* 93*  CO2 29 25 27 27 29   GLUCOSE 127* 120* 98 100* 106*  BUN 11 7 9 10 6   CREATININE 0.88 0.77 0.89 0.90 0.67  CALCIUM 8.6* 8.3* 9.0 8.3* 8.4*  MG  --  1.7  --   --   --    Liver Function Tests: No results for input(s): "AST", "ALT", "ALKPHOS", "BILITOT", "PROT", "ALBUMIN" in the last 168 hours. No results for input(s): "LIPASE", "AMYLASE" in the last 168 hours. No results for input(s): "AMMONIA" in the last 168 hours. CBC: Recent Labs  Lab  09/07/21 1152 09/08/21 0037 09/09/21 0748 09/10/21 0054 09/11/21 0030  WBC 10.7* 7.9 9.0 9.2 8.5  HGB 12.7* 10.8* 9.6* 7.9* 8.9*  HCT 38.3* 31.3* 28.6* 23.8* 26.1*  MCV 91.2 88.9 91.1 90.2 89.1  PLT 164 172 135* 135* 155   Cardiac Enzymes: No results for input(s): "CKTOTAL", "CKMB", "CKMBINDEX", "TROPONINI" in the last 168 hours. BNP: Invalid input(s): "POCBNP" CBG: No results for input(s): "GLUCAP" in the last 168 hours. D-Dimer No results for input(s): "DDIMER" in the last 72 hours. Hgb A1c No results for input(s): "HGBA1C" in the last 72 hours. Lipid Profile No results for input(s): "CHOL", "HDL", "LDLCALC", "TRIG", "CHOLHDL", "LDLDIRECT" in the last 72 hours. Thyroid function studies No results for input(s): "TSH", "T4TOTAL", "T3FREE", "THYROIDAB" in the last 72 hours.  Invalid input(s): "FREET3" Anemia work up No results for input(s): "VITAMINB12", "FOLATE", "FERRITIN", "TIBC", "IRON", "RETICCTPCT" in the last 72 hours. Urinalysis    Component Value Date/Time   COLORURINE STRAW (A) 06/27/2021 1026   APPEARANCEUR CLEAR 06/27/2021 1026   LABSPEC 1.004 (L) 06/27/2021 1026   PHURINE 8.0 06/27/2021 1026   GLUCOSEU NEGATIVE 06/27/2021 1026  HGBUR NEGATIVE 06/27/2021 1026   BILIRUBINUR NEGATIVE 06/27/2021 1026   KETONESUR NEGATIVE 06/27/2021 1026   PROTEINUR NEGATIVE 06/27/2021 1026   NITRITE NEGATIVE 06/27/2021 1026   LEUKOCYTESUR NEGATIVE 06/27/2021 1026   Sepsis Labs Recent Labs  Lab 09/08/21 0037 09/09/21 0748 09/10/21 0054 09/11/21 0030  WBC 7.9 9.0 9.2 8.5   Microbiology Recent Results (from the past 240 hour(s))  Surgical pcr screen     Status: None   Collection Time: 09/07/21 11:23 PM   Specimen: Nasal Mucosa; Nasal Swab  Result Value Ref Range Status   MRSA, PCR NEGATIVE NEGATIVE Final   Staphylococcus aureus NEGATIVE NEGATIVE Final    Comment: (NOTE) The Xpert SA Assay (FDA approved for NASAL specimens in patients 48 years of age and older), is  one component of a comprehensive surveillance program. It is not intended to diagnose infection nor to guide or monitor treatment. Performed at [facility], [place] [address], [place], [place] [address]      Time coordinating discharge: 39  minutes  SIGNED:   Georgette Shell, MD  [facility] 09/13/2021, 12:26 PM

## 2021-09-14 DIAGNOSIS — E785 Hyperlipidemia, unspecified: Secondary | ICD-10-CM | POA: Diagnosis not present

## 2021-09-14 DIAGNOSIS — S7225XD Nondisplaced subtrochanteric fracture of left femur, subsequent encounter for closed fracture with routine healing: Secondary | ICD-10-CM | POA: Diagnosis not present

## 2021-09-14 DIAGNOSIS — C3490 Malignant neoplasm of unspecified part of unspecified bronchus or lung: Secondary | ICD-10-CM | POA: Diagnosis not present

## 2021-09-14 DIAGNOSIS — C321 Malignant neoplasm of supraglottis: Secondary | ICD-10-CM | POA: Diagnosis not present

## 2021-09-14 DIAGNOSIS — E039 Hypothyroidism, unspecified: Secondary | ICD-10-CM | POA: Diagnosis not present

## 2021-09-14 DIAGNOSIS — K2100 Gastro-esophageal reflux disease with esophagitis, without bleeding: Secondary | ICD-10-CM | POA: Diagnosis not present

## 2021-09-14 DIAGNOSIS — I2510 Atherosclerotic heart disease of native coronary artery without angina pectoris: Secondary | ICD-10-CM | POA: Diagnosis not present

## 2021-09-14 DIAGNOSIS — E871 Hypo-osmolality and hyponatremia: Secondary | ICD-10-CM | POA: Diagnosis not present

## 2021-09-14 DIAGNOSIS — D62 Acute posthemorrhagic anemia: Secondary | ICD-10-CM | POA: Diagnosis not present

## 2021-09-14 DIAGNOSIS — I10 Essential (primary) hypertension: Secondary | ICD-10-CM | POA: Diagnosis not present

## 2021-09-15 ENCOUNTER — Other Ambulatory Visit: Payer: Self-pay | Admitting: Nurse Practitioner

## 2021-09-16 ENCOUNTER — Other Ambulatory Visit: Payer: Self-pay

## 2021-09-16 ENCOUNTER — Ambulatory Visit
Admission: RE | Admit: 2021-09-16 | Discharge: 2021-09-16 | Disposition: A | Discharge status: Home / Self Care | Payer: Medicare Other | Source: Ambulatory Visit | Attending: Radiation Oncology | Admitting: Radiation Oncology

## 2021-09-16 DIAGNOSIS — C3411 Malignant neoplasm of upper lobe, right bronchus or lung: Secondary | ICD-10-CM | POA: Diagnosis not present

## 2021-09-16 DIAGNOSIS — Z510 Encounter for antineoplastic radiation therapy: Secondary | ICD-10-CM | POA: Diagnosis not present

## 2021-09-16 LAB — RAD ONC ARIA SESSION SUMMARY
Course Elapsed Days: 0
Plan Fractions Treated to Date: 1
Plan Prescribed Dose Per Fraction: 18 Gy
Plan Total Fractions Prescribed: 3
Plan Total Prescribed Dose: 54 Gy
Reference Point Dosage Given to Date: 18 Gy
Reference Point Session Dosage Given: 18 Gy
Session Number: 1

## 2021-09-19 ENCOUNTER — Ambulatory Visit: Admission: RE | Admit: 2021-09-19 | Payer: Medicare Other | Source: Ambulatory Visit

## 2021-09-19 ENCOUNTER — Other Ambulatory Visit: Payer: Self-pay

## 2021-09-19 ENCOUNTER — Ambulatory Visit
Admission: RE | Admit: 2021-09-19 | Discharge: 2021-09-19 | Disposition: A | Discharge status: Home / Self Care | Payer: Medicare Other | Source: Ambulatory Visit | Attending: Radiation Oncology | Admitting: Radiation Oncology

## 2021-09-19 DIAGNOSIS — C321 Malignant neoplasm of supraglottis: Secondary | ICD-10-CM

## 2021-09-19 DIAGNOSIS — C3411 Malignant neoplasm of upper lobe, right bronchus or lung: Secondary | ICD-10-CM | POA: Diagnosis not present

## 2021-09-19 DIAGNOSIS — Z510 Encounter for antineoplastic radiation therapy: Secondary | ICD-10-CM | POA: Diagnosis not present

## 2021-09-19 LAB — RAD ONC ARIA SESSION SUMMARY
Course Elapsed Days: 3
Plan Fractions Treated to Date: 2
Plan Prescribed Dose Per Fraction: 18 Gy
Plan Total Fractions Prescribed: 3
Plan Total Prescribed Dose: 54 Gy
Reference Point Dosage Given to Date: 36 Gy
Reference Point Session Dosage Given: 18 Gy
Session Number: 2

## 2021-09-20 ENCOUNTER — Ambulatory Visit: Payer: Medicare Other | Admitting: Radiation Oncology

## 2021-09-20 DIAGNOSIS — Z8781 Personal history of (healed) traumatic fracture: Secondary | ICD-10-CM | POA: Diagnosis not present

## 2021-09-20 DIAGNOSIS — R5381 Other malaise: Secondary | ICD-10-CM | POA: Diagnosis not present

## 2021-09-20 DIAGNOSIS — Z9889 Other specified postprocedural states: Secondary | ICD-10-CM | POA: Diagnosis not present

## 2021-09-21 ENCOUNTER — Ambulatory Visit: Payer: Medicare Other

## 2021-09-21 ENCOUNTER — Other Ambulatory Visit: Payer: Self-pay

## 2021-09-21 ENCOUNTER — Encounter: Payer: Self-pay | Admitting: Radiation Oncology

## 2021-09-21 ENCOUNTER — Ambulatory Visit
Admission: RE | Admit: 2021-09-21 | Discharge: 2021-09-21 | Disposition: A | Discharge status: Home / Self Care | Payer: Medicare Other | Source: Ambulatory Visit | Attending: Radiation Oncology | Admitting: Radiation Oncology

## 2021-09-21 DIAGNOSIS — S7225XD Nondisplaced subtrochanteric fracture of left femur, subsequent encounter for closed fracture with routine healing: Secondary | ICD-10-CM | POA: Diagnosis not present

## 2021-09-21 DIAGNOSIS — C7802 Secondary malignant neoplasm of left lung: Secondary | ICD-10-CM | POA: Diagnosis not present

## 2021-09-21 DIAGNOSIS — C3411 Malignant neoplasm of upper lobe, right bronchus or lung: Secondary | ICD-10-CM | POA: Diagnosis not present

## 2021-09-21 DIAGNOSIS — Z510 Encounter for antineoplastic radiation therapy: Secondary | ICD-10-CM | POA: Diagnosis not present

## 2021-09-21 DIAGNOSIS — I2510 Atherosclerotic heart disease of native coronary artery without angina pectoris: Secondary | ICD-10-CM | POA: Diagnosis not present

## 2021-09-21 DIAGNOSIS — C321 Malignant neoplasm of supraglottis: Secondary | ICD-10-CM | POA: Diagnosis not present

## 2021-09-21 DIAGNOSIS — E871 Hypo-osmolality and hyponatremia: Secondary | ICD-10-CM | POA: Diagnosis not present

## 2021-09-21 DIAGNOSIS — E039 Hypothyroidism, unspecified: Secondary | ICD-10-CM | POA: Diagnosis not present

## 2021-09-21 LAB — RAD ONC ARIA SESSION SUMMARY
Course Elapsed Days: 5
Plan Fractions Treated to Date: 3
Plan Prescribed Dose Per Fraction: 18 Gy
Plan Total Fractions Prescribed: 3
Plan Total Prescribed Dose: 54 Gy
Reference Point Dosage Given to Date: 54 Gy
Reference Point Session Dosage Given: 18 Gy
Session Number: 3

## 2021-09-23 DIAGNOSIS — C3490 Malignant neoplasm of unspecified part of unspecified bronchus or lung: Secondary | ICD-10-CM | POA: Diagnosis not present

## 2021-09-23 DIAGNOSIS — I10 Essential (primary) hypertension: Secondary | ICD-10-CM | POA: Diagnosis not present

## 2021-09-23 DIAGNOSIS — E039 Hypothyroidism, unspecified: Secondary | ICD-10-CM | POA: Diagnosis not present

## 2021-09-23 DIAGNOSIS — S7225XD Nondisplaced subtrochanteric fracture of left femur, subsequent encounter for closed fracture with routine healing: Secondary | ICD-10-CM | POA: Diagnosis not present

## 2021-09-23 DIAGNOSIS — I2510 Atherosclerotic heart disease of native coronary artery without angina pectoris: Secondary | ICD-10-CM | POA: Diagnosis not present

## 2021-09-27 ENCOUNTER — Ambulatory Visit: Payer: Medicare Other | Admitting: Adult Health

## 2021-09-28 DIAGNOSIS — I2510 Atherosclerotic heart disease of native coronary artery without angina pectoris: Secondary | ICD-10-CM | POA: Diagnosis not present

## 2021-09-28 DIAGNOSIS — E785 Hyperlipidemia, unspecified: Secondary | ICD-10-CM | POA: Diagnosis not present

## 2021-09-28 DIAGNOSIS — E039 Hypothyroidism, unspecified: Secondary | ICD-10-CM | POA: Diagnosis not present

## 2021-09-28 DIAGNOSIS — C3490 Malignant neoplasm of unspecified part of unspecified bronchus or lung: Secondary | ICD-10-CM | POA: Diagnosis not present

## 2021-09-28 DIAGNOSIS — S7225XD Nondisplaced subtrochanteric fracture of left femur, subsequent encounter for closed fracture with routine healing: Secondary | ICD-10-CM | POA: Diagnosis not present

## 2021-09-28 DIAGNOSIS — I10 Essential (primary) hypertension: Secondary | ICD-10-CM | POA: Diagnosis not present

## 2021-09-30 DIAGNOSIS — C3490 Malignant neoplasm of unspecified part of unspecified bronchus or lung: Secondary | ICD-10-CM | POA: Diagnosis not present

## 2021-09-30 DIAGNOSIS — G8929 Other chronic pain: Secondary | ICD-10-CM | POA: Diagnosis not present

## 2021-09-30 DIAGNOSIS — I6529 Occlusion and stenosis of unspecified carotid artery: Secondary | ICD-10-CM | POA: Diagnosis not present

## 2021-09-30 DIAGNOSIS — S72142D Displaced intertrochanteric fracture of left femur, subsequent encounter for closed fracture with routine healing: Secondary | ICD-10-CM | POA: Diagnosis not present

## 2021-09-30 DIAGNOSIS — K2100 Gastro-esophageal reflux disease with esophagitis, without bleeding: Secondary | ICD-10-CM | POA: Diagnosis not present

## 2021-09-30 DIAGNOSIS — C321 Malignant neoplasm of supraglottis: Secondary | ICD-10-CM | POA: Diagnosis not present

## 2021-09-30 DIAGNOSIS — Z7982 Long term (current) use of aspirin: Secondary | ICD-10-CM | POA: Diagnosis not present

## 2021-09-30 DIAGNOSIS — I10 Essential (primary) hypertension: Secondary | ICD-10-CM | POA: Diagnosis not present

## 2021-09-30 DIAGNOSIS — Z8589 Personal history of malignant neoplasm of other organs and systems: Secondary | ICD-10-CM | POA: Diagnosis not present

## 2021-09-30 DIAGNOSIS — Z931 Gastrostomy status: Secondary | ICD-10-CM | POA: Diagnosis not present

## 2021-09-30 DIAGNOSIS — F32A Depression, unspecified: Secondary | ICD-10-CM | POA: Diagnosis not present

## 2021-09-30 DIAGNOSIS — Z7901 Long term (current) use of anticoagulants: Secondary | ICD-10-CM | POA: Diagnosis not present

## 2021-09-30 DIAGNOSIS — I69351 Hemiplegia and hemiparesis following cerebral infarction affecting right dominant side: Secondary | ICD-10-CM | POA: Diagnosis not present

## 2021-09-30 DIAGNOSIS — D62 Acute posthemorrhagic anemia: Secondary | ICD-10-CM | POA: Diagnosis not present

## 2021-09-30 DIAGNOSIS — Z9181 History of falling: Secondary | ICD-10-CM | POA: Diagnosis not present

## 2021-09-30 DIAGNOSIS — M545 Low back pain, unspecified: Secondary | ICD-10-CM | POA: Diagnosis not present

## 2021-09-30 DIAGNOSIS — E7800 Pure hypercholesterolemia, unspecified: Secondary | ICD-10-CM | POA: Diagnosis not present

## 2021-09-30 DIAGNOSIS — M1990 Unspecified osteoarthritis, unspecified site: Secondary | ICD-10-CM | POA: Diagnosis not present

## 2021-09-30 DIAGNOSIS — I2510 Atherosclerotic heart disease of native coronary artery without angina pectoris: Secondary | ICD-10-CM | POA: Diagnosis not present

## 2021-10-03 ENCOUNTER — Telehealth: Payer: Self-pay

## 2021-10-03 DIAGNOSIS — C321 Malignant neoplasm of supraglottis: Secondary | ICD-10-CM | POA: Diagnosis not present

## 2021-10-03 DIAGNOSIS — G894 Chronic pain syndrome: Secondary | ICD-10-CM | POA: Diagnosis not present

## 2021-10-03 DIAGNOSIS — E440 Moderate protein-calorie malnutrition: Secondary | ICD-10-CM | POA: Diagnosis not present

## 2021-10-03 DIAGNOSIS — I10 Essential (primary) hypertension: Secondary | ICD-10-CM | POA: Diagnosis not present

## 2021-10-03 NOTE — Telephone Encounter (Signed)
-----   Message from Eppie Gibson, MD sent at 10/03/2021  2:49 PM EDT ----- Regarding: RE: PET scan order The 32mo f/u appt in Oct is just to make sure he's not having any unusual acute side effects from RT.  We will give him 3 mo to heal from the RT before we re-scan. Thanks! ----- Message ----- From: Gardiner Rhyme, RN Sent: 10/03/2021   2:19 PM EDT To: Eppie Gibson, MD; Zola Button, RN Subject: PET scan order                                 Hi Dr. Isidore Moos, [name] called about her husband, [name], PET scan.  She said she thought it needed to be done before [facility] next follow up which is 10/28/21 but the order has not been entered yet and it has not been scheduled.  If you put an expected date on the order, they will get it scheduled in a timely fashion.  I can call her back once you let me know. Gardiner Rhyme, RN

## 2021-10-03 NOTE — Progress Notes (Signed)
                                                                                                                                                             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   Plan: The patient will follow-up with radiation oncology in 55mo . -----------------------------------  Eppie Gibson, MD

## 2021-10-03 NOTE — Telephone Encounter (Signed)
Mrs. Mcfadden called to confirm scheduling of [name]'s PET scan. Per her note, he will not get a scan until 3 months out from radiation.  His visit in October will be for a check in for any unusual side effects.  Mrs. Dragan let me know that he does not have any appetite and that the marinol pills seemed to help that Dr. Delton Coombes prescribed. Advised her to call the [facility] to get this prescription refilled. She understood and will call us with any other issues related to radiation before that. Gardiner Rhyme, RN

## 2021-10-04 DIAGNOSIS — C321 Malignant neoplasm of supraglottis: Secondary | ICD-10-CM | POA: Diagnosis not present

## 2021-10-04 DIAGNOSIS — I69351 Hemiplegia and hemiparesis following cerebral infarction affecting right dominant side: Secondary | ICD-10-CM | POA: Diagnosis not present

## 2021-10-04 DIAGNOSIS — Z9181 History of falling: Secondary | ICD-10-CM | POA: Diagnosis not present

## 2021-10-04 DIAGNOSIS — Z931 Gastrostomy status: Secondary | ICD-10-CM | POA: Diagnosis not present

## 2021-10-04 DIAGNOSIS — D62 Acute posthemorrhagic anemia: Secondary | ICD-10-CM | POA: Diagnosis not present

## 2021-10-04 DIAGNOSIS — Z7982 Long term (current) use of aspirin: Secondary | ICD-10-CM | POA: Diagnosis not present

## 2021-10-04 DIAGNOSIS — M1990 Unspecified osteoarthritis, unspecified site: Secondary | ICD-10-CM | POA: Diagnosis not present

## 2021-10-04 DIAGNOSIS — E7800 Pure hypercholesterolemia, unspecified: Secondary | ICD-10-CM | POA: Diagnosis not present

## 2021-10-04 DIAGNOSIS — I2510 Atherosclerotic heart disease of native coronary artery without angina pectoris: Secondary | ICD-10-CM | POA: Diagnosis not present

## 2021-10-04 DIAGNOSIS — C3490 Malignant neoplasm of unspecified part of unspecified bronchus or lung: Secondary | ICD-10-CM | POA: Diagnosis not present

## 2021-10-04 DIAGNOSIS — I10 Essential (primary) hypertension: Secondary | ICD-10-CM | POA: Diagnosis not present

## 2021-10-04 DIAGNOSIS — I6529 Occlusion and stenosis of unspecified carotid artery: Secondary | ICD-10-CM | POA: Diagnosis not present

## 2021-10-04 DIAGNOSIS — S72142D Displaced intertrochanteric fracture of left femur, subsequent encounter for closed fracture with routine healing: Secondary | ICD-10-CM | POA: Diagnosis not present

## 2021-10-04 DIAGNOSIS — G8929 Other chronic pain: Secondary | ICD-10-CM | POA: Diagnosis not present

## 2021-10-04 DIAGNOSIS — M545 Low back pain, unspecified: Secondary | ICD-10-CM | POA: Diagnosis not present

## 2021-10-04 DIAGNOSIS — Z7901 Long term (current) use of anticoagulants: Secondary | ICD-10-CM | POA: Diagnosis not present

## 2021-10-04 DIAGNOSIS — K2100 Gastro-esophageal reflux disease with esophagitis, without bleeding: Secondary | ICD-10-CM | POA: Diagnosis not present

## 2021-10-04 DIAGNOSIS — Z8589 Personal history of malignant neoplasm of other organs and systems: Secondary | ICD-10-CM | POA: Diagnosis not present

## 2021-10-04 DIAGNOSIS — F32A Depression, unspecified: Secondary | ICD-10-CM | POA: Diagnosis not present

## 2021-10-05 DIAGNOSIS — S72142D Displaced intertrochanteric fracture of left femur, subsequent encounter for closed fracture with routine healing: Secondary | ICD-10-CM | POA: Diagnosis not present

## 2021-10-06 DIAGNOSIS — C321 Malignant neoplasm of supraglottis: Secondary | ICD-10-CM | POA: Diagnosis not present

## 2021-10-06 DIAGNOSIS — M545 Low back pain, unspecified: Secondary | ICD-10-CM | POA: Diagnosis not present

## 2021-10-06 DIAGNOSIS — S72142D Displaced intertrochanteric fracture of left femur, subsequent encounter for closed fracture with routine healing: Secondary | ICD-10-CM | POA: Diagnosis not present

## 2021-10-06 DIAGNOSIS — I10 Essential (primary) hypertension: Secondary | ICD-10-CM | POA: Diagnosis not present

## 2021-10-06 DIAGNOSIS — K2100 Gastro-esophageal reflux disease with esophagitis, without bleeding: Secondary | ICD-10-CM | POA: Diagnosis not present

## 2021-10-06 DIAGNOSIS — Z8589 Personal history of malignant neoplasm of other organs and systems: Secondary | ICD-10-CM | POA: Diagnosis not present

## 2021-10-06 DIAGNOSIS — Z9181 History of falling: Secondary | ICD-10-CM | POA: Diagnosis not present

## 2021-10-06 DIAGNOSIS — Z7901 Long term (current) use of anticoagulants: Secondary | ICD-10-CM | POA: Diagnosis not present

## 2021-10-06 DIAGNOSIS — C3490 Malignant neoplasm of unspecified part of unspecified bronchus or lung: Secondary | ICD-10-CM | POA: Diagnosis not present

## 2021-10-06 DIAGNOSIS — I6529 Occlusion and stenosis of unspecified carotid artery: Secondary | ICD-10-CM | POA: Diagnosis not present

## 2021-10-06 DIAGNOSIS — Z7982 Long term (current) use of aspirin: Secondary | ICD-10-CM | POA: Diagnosis not present

## 2021-10-06 DIAGNOSIS — Z931 Gastrostomy status: Secondary | ICD-10-CM | POA: Diagnosis not present

## 2021-10-06 DIAGNOSIS — M1990 Unspecified osteoarthritis, unspecified site: Secondary | ICD-10-CM | POA: Diagnosis not present

## 2021-10-06 DIAGNOSIS — D62 Acute posthemorrhagic anemia: Secondary | ICD-10-CM | POA: Diagnosis not present

## 2021-10-06 DIAGNOSIS — I2510 Atherosclerotic heart disease of native coronary artery without angina pectoris: Secondary | ICD-10-CM | POA: Diagnosis not present

## 2021-10-06 DIAGNOSIS — F32A Depression, unspecified: Secondary | ICD-10-CM | POA: Diagnosis not present

## 2021-10-06 DIAGNOSIS — I69351 Hemiplegia and hemiparesis following cerebral infarction affecting right dominant side: Secondary | ICD-10-CM | POA: Diagnosis not present

## 2021-10-06 DIAGNOSIS — E7800 Pure hypercholesterolemia, unspecified: Secondary | ICD-10-CM | POA: Diagnosis not present

## 2021-10-06 DIAGNOSIS — G8929 Other chronic pain: Secondary | ICD-10-CM | POA: Diagnosis not present

## 2021-10-10 DIAGNOSIS — F32A Depression, unspecified: Secondary | ICD-10-CM | POA: Diagnosis not present

## 2021-10-10 DIAGNOSIS — Z8589 Personal history of malignant neoplasm of other organs and systems: Secondary | ICD-10-CM | POA: Diagnosis not present

## 2021-10-10 DIAGNOSIS — I6529 Occlusion and stenosis of unspecified carotid artery: Secondary | ICD-10-CM | POA: Diagnosis not present

## 2021-10-10 DIAGNOSIS — D62 Acute posthemorrhagic anemia: Secondary | ICD-10-CM | POA: Diagnosis not present

## 2021-10-10 DIAGNOSIS — C3490 Malignant neoplasm of unspecified part of unspecified bronchus or lung: Secondary | ICD-10-CM | POA: Diagnosis not present

## 2021-10-10 DIAGNOSIS — C321 Malignant neoplasm of supraglottis: Secondary | ICD-10-CM | POA: Diagnosis not present

## 2021-10-10 DIAGNOSIS — Z7982 Long term (current) use of aspirin: Secondary | ICD-10-CM | POA: Diagnosis not present

## 2021-10-10 DIAGNOSIS — I10 Essential (primary) hypertension: Secondary | ICD-10-CM | POA: Diagnosis not present

## 2021-10-10 DIAGNOSIS — Z9181 History of falling: Secondary | ICD-10-CM | POA: Diagnosis not present

## 2021-10-10 DIAGNOSIS — M1990 Unspecified osteoarthritis, unspecified site: Secondary | ICD-10-CM | POA: Diagnosis not present

## 2021-10-10 DIAGNOSIS — G8929 Other chronic pain: Secondary | ICD-10-CM | POA: Diagnosis not present

## 2021-10-10 DIAGNOSIS — S72142D Displaced intertrochanteric fracture of left femur, subsequent encounter for closed fracture with routine healing: Secondary | ICD-10-CM | POA: Diagnosis not present

## 2021-10-10 DIAGNOSIS — E7800 Pure hypercholesterolemia, unspecified: Secondary | ICD-10-CM | POA: Diagnosis not present

## 2021-10-10 DIAGNOSIS — I69351 Hemiplegia and hemiparesis following cerebral infarction affecting right dominant side: Secondary | ICD-10-CM | POA: Diagnosis not present

## 2021-10-10 DIAGNOSIS — K2100 Gastro-esophageal reflux disease with esophagitis, without bleeding: Secondary | ICD-10-CM | POA: Diagnosis not present

## 2021-10-10 DIAGNOSIS — M545 Low back pain, unspecified: Secondary | ICD-10-CM | POA: Diagnosis not present

## 2021-10-10 DIAGNOSIS — Z931 Gastrostomy status: Secondary | ICD-10-CM | POA: Diagnosis not present

## 2021-10-10 DIAGNOSIS — I2510 Atherosclerotic heart disease of native coronary artery without angina pectoris: Secondary | ICD-10-CM | POA: Diagnosis not present

## 2021-10-10 DIAGNOSIS — Z7901 Long term (current) use of anticoagulants: Secondary | ICD-10-CM | POA: Diagnosis not present

## 2021-10-11 DIAGNOSIS — G8929 Other chronic pain: Secondary | ICD-10-CM | POA: Diagnosis not present

## 2021-10-11 DIAGNOSIS — E7800 Pure hypercholesterolemia, unspecified: Secondary | ICD-10-CM | POA: Diagnosis not present

## 2021-10-11 DIAGNOSIS — C321 Malignant neoplasm of supraglottis: Secondary | ICD-10-CM | POA: Diagnosis not present

## 2021-10-11 DIAGNOSIS — C3490 Malignant neoplasm of unspecified part of unspecified bronchus or lung: Secondary | ICD-10-CM | POA: Diagnosis not present

## 2021-10-11 DIAGNOSIS — Z7982 Long term (current) use of aspirin: Secondary | ICD-10-CM | POA: Diagnosis not present

## 2021-10-11 DIAGNOSIS — S72142D Displaced intertrochanteric fracture of left femur, subsequent encounter for closed fracture with routine healing: Secondary | ICD-10-CM | POA: Diagnosis not present

## 2021-10-11 DIAGNOSIS — M545 Low back pain, unspecified: Secondary | ICD-10-CM | POA: Diagnosis not present

## 2021-10-11 DIAGNOSIS — I6529 Occlusion and stenosis of unspecified carotid artery: Secondary | ICD-10-CM | POA: Diagnosis not present

## 2021-10-11 DIAGNOSIS — I10 Essential (primary) hypertension: Secondary | ICD-10-CM | POA: Diagnosis not present

## 2021-10-11 DIAGNOSIS — I2510 Atherosclerotic heart disease of native coronary artery without angina pectoris: Secondary | ICD-10-CM | POA: Diagnosis not present

## 2021-10-11 DIAGNOSIS — I69351 Hemiplegia and hemiparesis following cerebral infarction affecting right dominant side: Secondary | ICD-10-CM | POA: Diagnosis not present

## 2021-10-11 DIAGNOSIS — Z7901 Long term (current) use of anticoagulants: Secondary | ICD-10-CM | POA: Diagnosis not present

## 2021-10-11 DIAGNOSIS — M1990 Unspecified osteoarthritis, unspecified site: Secondary | ICD-10-CM | POA: Diagnosis not present

## 2021-10-11 DIAGNOSIS — Z9181 History of falling: Secondary | ICD-10-CM | POA: Diagnosis not present

## 2021-10-11 DIAGNOSIS — K2100 Gastro-esophageal reflux disease with esophagitis, without bleeding: Secondary | ICD-10-CM | POA: Diagnosis not present

## 2021-10-11 DIAGNOSIS — Z8589 Personal history of malignant neoplasm of other organs and systems: Secondary | ICD-10-CM | POA: Diagnosis not present

## 2021-10-11 DIAGNOSIS — F32A Depression, unspecified: Secondary | ICD-10-CM | POA: Diagnosis not present

## 2021-10-11 DIAGNOSIS — Z931 Gastrostomy status: Secondary | ICD-10-CM | POA: Diagnosis not present

## 2021-10-11 DIAGNOSIS — D62 Acute posthemorrhagic anemia: Secondary | ICD-10-CM | POA: Diagnosis not present

## 2021-10-13 DIAGNOSIS — Z931 Gastrostomy status: Secondary | ICD-10-CM | POA: Diagnosis not present

## 2021-10-13 DIAGNOSIS — S72142D Displaced intertrochanteric fracture of left femur, subsequent encounter for closed fracture with routine healing: Secondary | ICD-10-CM | POA: Diagnosis not present

## 2021-10-13 DIAGNOSIS — I10 Essential (primary) hypertension: Secondary | ICD-10-CM | POA: Diagnosis not present

## 2021-10-13 DIAGNOSIS — Z8589 Personal history of malignant neoplasm of other organs and systems: Secondary | ICD-10-CM | POA: Diagnosis not present

## 2021-10-13 DIAGNOSIS — C3490 Malignant neoplasm of unspecified part of unspecified bronchus or lung: Secondary | ICD-10-CM | POA: Diagnosis not present

## 2021-10-13 DIAGNOSIS — M545 Low back pain, unspecified: Secondary | ICD-10-CM | POA: Diagnosis not present

## 2021-10-13 DIAGNOSIS — F32A Depression, unspecified: Secondary | ICD-10-CM | POA: Diagnosis not present

## 2021-10-13 DIAGNOSIS — I2510 Atherosclerotic heart disease of native coronary artery without angina pectoris: Secondary | ICD-10-CM | POA: Diagnosis not present

## 2021-10-13 DIAGNOSIS — D62 Acute posthemorrhagic anemia: Secondary | ICD-10-CM | POA: Diagnosis not present

## 2021-10-13 DIAGNOSIS — M1990 Unspecified osteoarthritis, unspecified site: Secondary | ICD-10-CM | POA: Diagnosis not present

## 2021-10-13 DIAGNOSIS — Z7982 Long term (current) use of aspirin: Secondary | ICD-10-CM | POA: Diagnosis not present

## 2021-10-13 DIAGNOSIS — I69351 Hemiplegia and hemiparesis following cerebral infarction affecting right dominant side: Secondary | ICD-10-CM | POA: Diagnosis not present

## 2021-10-13 DIAGNOSIS — G8929 Other chronic pain: Secondary | ICD-10-CM | POA: Diagnosis not present

## 2021-10-13 DIAGNOSIS — E7800 Pure hypercholesterolemia, unspecified: Secondary | ICD-10-CM | POA: Diagnosis not present

## 2021-10-13 DIAGNOSIS — K2100 Gastro-esophageal reflux disease with esophagitis, without bleeding: Secondary | ICD-10-CM | POA: Diagnosis not present

## 2021-10-13 DIAGNOSIS — Z9181 History of falling: Secondary | ICD-10-CM | POA: Diagnosis not present

## 2021-10-13 DIAGNOSIS — C321 Malignant neoplasm of supraglottis: Secondary | ICD-10-CM | POA: Diagnosis not present

## 2021-10-13 DIAGNOSIS — I6529 Occlusion and stenosis of unspecified carotid artery: Secondary | ICD-10-CM | POA: Diagnosis not present

## 2021-10-13 DIAGNOSIS — Z7901 Long term (current) use of anticoagulants: Secondary | ICD-10-CM | POA: Diagnosis not present

## 2021-10-14 DIAGNOSIS — E871 Hypo-osmolality and hyponatremia: Secondary | ICD-10-CM | POA: Diagnosis not present

## 2021-10-17 ENCOUNTER — Other Ambulatory Visit: Payer: Self-pay

## 2021-10-17 MED ORDER — DRONABINOL 5 MG PO CAPS: 5.0000 mg | ORAL_CAPSULE | Freq: Two times a day (BID) | ORAL | 2 refills | Status: DC

## 2021-10-19 DIAGNOSIS — Z9181 History of falling: Secondary | ICD-10-CM | POA: Diagnosis not present

## 2021-10-19 DIAGNOSIS — Z7901 Long term (current) use of anticoagulants: Secondary | ICD-10-CM | POA: Diagnosis not present

## 2021-10-19 DIAGNOSIS — Z8589 Personal history of malignant neoplasm of other organs and systems: Secondary | ICD-10-CM | POA: Diagnosis not present

## 2021-10-19 DIAGNOSIS — K2100 Gastro-esophageal reflux disease with esophagitis, without bleeding: Secondary | ICD-10-CM | POA: Diagnosis not present

## 2021-10-19 DIAGNOSIS — F32A Depression, unspecified: Secondary | ICD-10-CM | POA: Diagnosis not present

## 2021-10-19 DIAGNOSIS — D62 Acute posthemorrhagic anemia: Secondary | ICD-10-CM | POA: Diagnosis not present

## 2021-10-19 DIAGNOSIS — Z931 Gastrostomy status: Secondary | ICD-10-CM | POA: Diagnosis not present

## 2021-10-19 DIAGNOSIS — S72142D Displaced intertrochanteric fracture of left femur, subsequent encounter for closed fracture with routine healing: Secondary | ICD-10-CM | POA: Diagnosis not present

## 2021-10-19 DIAGNOSIS — I10 Essential (primary) hypertension: Secondary | ICD-10-CM | POA: Diagnosis not present

## 2021-10-19 DIAGNOSIS — M545 Low back pain, unspecified: Secondary | ICD-10-CM | POA: Diagnosis not present

## 2021-10-19 DIAGNOSIS — G8929 Other chronic pain: Secondary | ICD-10-CM | POA: Diagnosis not present

## 2021-10-19 DIAGNOSIS — I2510 Atherosclerotic heart disease of native coronary artery without angina pectoris: Secondary | ICD-10-CM | POA: Diagnosis not present

## 2021-10-19 DIAGNOSIS — C321 Malignant neoplasm of supraglottis: Secondary | ICD-10-CM | POA: Diagnosis not present

## 2021-10-19 DIAGNOSIS — M1990 Unspecified osteoarthritis, unspecified site: Secondary | ICD-10-CM | POA: Diagnosis not present

## 2021-10-19 DIAGNOSIS — C3490 Malignant neoplasm of unspecified part of unspecified bronchus or lung: Secondary | ICD-10-CM | POA: Diagnosis not present

## 2021-10-19 DIAGNOSIS — Z7982 Long term (current) use of aspirin: Secondary | ICD-10-CM | POA: Diagnosis not present

## 2021-10-19 DIAGNOSIS — E7800 Pure hypercholesterolemia, unspecified: Secondary | ICD-10-CM | POA: Diagnosis not present

## 2021-10-19 DIAGNOSIS — I69351 Hemiplegia and hemiparesis following cerebral infarction affecting right dominant side: Secondary | ICD-10-CM | POA: Diagnosis not present

## 2021-10-19 DIAGNOSIS — I6529 Occlusion and stenosis of unspecified carotid artery: Secondary | ICD-10-CM | POA: Diagnosis not present

## 2021-10-21 DIAGNOSIS — Z9181 History of falling: Secondary | ICD-10-CM | POA: Diagnosis not present

## 2021-10-21 DIAGNOSIS — G8929 Other chronic pain: Secondary | ICD-10-CM | POA: Diagnosis not present

## 2021-10-21 DIAGNOSIS — Z7982 Long term (current) use of aspirin: Secondary | ICD-10-CM | POA: Diagnosis not present

## 2021-10-21 DIAGNOSIS — I2510 Atherosclerotic heart disease of native coronary artery without angina pectoris: Secondary | ICD-10-CM | POA: Diagnosis not present

## 2021-10-21 DIAGNOSIS — F32A Depression, unspecified: Secondary | ICD-10-CM | POA: Diagnosis not present

## 2021-10-21 DIAGNOSIS — M545 Low back pain, unspecified: Secondary | ICD-10-CM | POA: Diagnosis not present

## 2021-10-21 DIAGNOSIS — S72142D Displaced intertrochanteric fracture of left femur, subsequent encounter for closed fracture with routine healing: Secondary | ICD-10-CM | POA: Diagnosis not present

## 2021-10-21 DIAGNOSIS — C3490 Malignant neoplasm of unspecified part of unspecified bronchus or lung: Secondary | ICD-10-CM | POA: Diagnosis not present

## 2021-10-21 DIAGNOSIS — I6529 Occlusion and stenosis of unspecified carotid artery: Secondary | ICD-10-CM | POA: Diagnosis not present

## 2021-10-21 DIAGNOSIS — K2100 Gastro-esophageal reflux disease with esophagitis, without bleeding: Secondary | ICD-10-CM | POA: Diagnosis not present

## 2021-10-21 DIAGNOSIS — Z8589 Personal history of malignant neoplasm of other organs and systems: Secondary | ICD-10-CM | POA: Diagnosis not present

## 2021-10-21 DIAGNOSIS — E7800 Pure hypercholesterolemia, unspecified: Secondary | ICD-10-CM | POA: Diagnosis not present

## 2021-10-21 DIAGNOSIS — I69351 Hemiplegia and hemiparesis following cerebral infarction affecting right dominant side: Secondary | ICD-10-CM | POA: Diagnosis not present

## 2021-10-21 DIAGNOSIS — I10 Essential (primary) hypertension: Secondary | ICD-10-CM | POA: Diagnosis not present

## 2021-10-21 DIAGNOSIS — Z7901 Long term (current) use of anticoagulants: Secondary | ICD-10-CM | POA: Diagnosis not present

## 2021-10-21 DIAGNOSIS — M1990 Unspecified osteoarthritis, unspecified site: Secondary | ICD-10-CM | POA: Diagnosis not present

## 2021-10-21 DIAGNOSIS — C321 Malignant neoplasm of supraglottis: Secondary | ICD-10-CM | POA: Diagnosis not present

## 2021-10-21 DIAGNOSIS — Z931 Gastrostomy status: Secondary | ICD-10-CM | POA: Diagnosis not present

## 2021-10-21 DIAGNOSIS — D62 Acute posthemorrhagic anemia: Secondary | ICD-10-CM | POA: Diagnosis not present

## 2021-10-24 ENCOUNTER — Other Ambulatory Visit: Payer: Self-pay | Admitting: *Deleted

## 2021-10-24 MED ORDER — VALPROIC ACID 250 MG/5ML PO SOLN: ORAL | 5 refills | Status: DC

## 2021-10-25 DIAGNOSIS — G894 Chronic pain syndrome: Secondary | ICD-10-CM | POA: Diagnosis not present

## 2021-10-25 DIAGNOSIS — I10 Essential (primary) hypertension: Secondary | ICD-10-CM | POA: Diagnosis not present

## 2021-10-25 DIAGNOSIS — I6529 Occlusion and stenosis of unspecified carotid artery: Secondary | ICD-10-CM | POA: Diagnosis not present

## 2021-10-25 DIAGNOSIS — Z7901 Long term (current) use of anticoagulants: Secondary | ICD-10-CM | POA: Diagnosis not present

## 2021-10-25 DIAGNOSIS — K2100 Gastro-esophageal reflux disease with esophagitis, without bleeding: Secondary | ICD-10-CM | POA: Diagnosis not present

## 2021-10-25 DIAGNOSIS — M4306 Spondylolysis, lumbar region: Secondary | ICD-10-CM | POA: Diagnosis not present

## 2021-10-25 DIAGNOSIS — G8929 Other chronic pain: Secondary | ICD-10-CM | POA: Diagnosis not present

## 2021-10-25 DIAGNOSIS — S72142D Displaced intertrochanteric fracture of left femur, subsequent encounter for closed fracture with routine healing: Secondary | ICD-10-CM | POA: Diagnosis not present

## 2021-10-25 DIAGNOSIS — D62 Acute posthemorrhagic anemia: Secondary | ICD-10-CM | POA: Diagnosis not present

## 2021-10-25 DIAGNOSIS — Z7982 Long term (current) use of aspirin: Secondary | ICD-10-CM | POA: Diagnosis not present

## 2021-10-25 DIAGNOSIS — C321 Malignant neoplasm of supraglottis: Secondary | ICD-10-CM | POA: Diagnosis not present

## 2021-10-25 DIAGNOSIS — Z931 Gastrostomy status: Secondary | ICD-10-CM | POA: Diagnosis not present

## 2021-10-25 DIAGNOSIS — M1990 Unspecified osteoarthritis, unspecified site: Secondary | ICD-10-CM | POA: Diagnosis not present

## 2021-10-25 DIAGNOSIS — M549 Dorsalgia, unspecified: Secondary | ICD-10-CM | POA: Diagnosis not present

## 2021-10-25 DIAGNOSIS — C3490 Malignant neoplasm of unspecified part of unspecified bronchus or lung: Secondary | ICD-10-CM | POA: Diagnosis not present

## 2021-10-25 DIAGNOSIS — M545 Low back pain, unspecified: Secondary | ICD-10-CM | POA: Diagnosis not present

## 2021-10-25 DIAGNOSIS — E7800 Pure hypercholesterolemia, unspecified: Secondary | ICD-10-CM | POA: Diagnosis not present

## 2021-10-25 DIAGNOSIS — F32A Depression, unspecified: Secondary | ICD-10-CM | POA: Diagnosis not present

## 2021-10-25 DIAGNOSIS — Z8589 Personal history of malignant neoplasm of other organs and systems: Secondary | ICD-10-CM | POA: Diagnosis not present

## 2021-10-25 DIAGNOSIS — I69351 Hemiplegia and hemiparesis following cerebral infarction affecting right dominant side: Secondary | ICD-10-CM | POA: Diagnosis not present

## 2021-10-25 DIAGNOSIS — Z9181 History of falling: Secondary | ICD-10-CM | POA: Diagnosis not present

## 2021-10-25 DIAGNOSIS — I2510 Atherosclerotic heart disease of native coronary artery without angina pectoris: Secondary | ICD-10-CM | POA: Diagnosis not present

## 2021-10-28 ENCOUNTER — Encounter: Payer: Self-pay | Admitting: Radiation Oncology

## 2021-10-28 ENCOUNTER — Ambulatory Visit
Admission: RE | Admit: 2021-10-28 | Discharge: 2021-10-28 | Disposition: A | Discharge status: Home / Self Care | Payer: Medicare Other | Source: Ambulatory Visit | Attending: Radiation Oncology | Admitting: Radiation Oncology

## 2021-10-28 VITALS — BP 111/70 | HR 70 | Temp 98.4°F | Resp 18 | Ht 73.0 in | Wt 155.4 lb

## 2021-10-28 DIAGNOSIS — C321 Malignant neoplasm of supraglottis: Secondary | ICD-10-CM

## 2021-10-28 DIAGNOSIS — Z923 Personal history of irradiation: Secondary | ICD-10-CM | POA: Diagnosis not present

## 2021-10-28 DIAGNOSIS — C3411 Malignant neoplasm of upper lobe, right bronchus or lung: Secondary | ICD-10-CM | POA: Diagnosis not present

## 2021-10-28 DIAGNOSIS — C760 Malignant neoplasm of head, face and neck: Secondary | ICD-10-CM

## 2021-10-28 DIAGNOSIS — C7802 Secondary malignant neoplasm of left lung: Secondary | ICD-10-CM | POA: Diagnosis not present

## 2021-10-28 DIAGNOSIS — Z79899 Other long term (current) drug therapy: Secondary | ICD-10-CM | POA: Insufficient documentation

## 2021-10-28 DIAGNOSIS — Z7901 Long term (current) use of anticoagulants: Secondary | ICD-10-CM | POA: Insufficient documentation

## 2021-10-28 NOTE — Progress Notes (Signed)
Radiation Oncology         ([phone] ________________________________  Name: [name] MRN: [id number]  Date: 10/28/2021  DOB: 24-Jun-1960  Follow-Up Visit Note  Outpatient  CC: [name], MD  [name], MD  Diagnosis and Prior Radiotherapy:    ICD-10-CM   1. 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)   Radiation Treatment Dates: 09/16/2021 through 09/21/2021 Site Technique Total Dose (Gy) Dose per Fx (Gy) Completed Fx Beam Energies  Lung, Left: Lung_L_LL_retx IMRT 54/54 18 3/3 6XFFF    Radiation Treatment Dates: 08/10/2020 through 08/20/2020 Site Technique Total Dose (Gy) Dose per Fx (Gy) Completed Fx Beam Energies  Lung, Right: Lung_Rt_upper IMRT 50/50 10 5/5 6XFFF  Lung, Left: Lung_Lt_lower IMRT 50/50 10 5/5 6XFFF   Radiation Treatment Dates: 03/05/2019 through 05/08/2019 Site Technique Total Dose (Gy) Dose per Fx (Gy) Completed Fx Beam Energies  Larynx: HN_larynx IMRT 70/70 2 35/35 6X    CHIEF COMPLAINT: Here for follow-up after lung treatment  Narrative:  The patient returns today for routine follow-up.  He is with his wife.  He fell at home 3 to 4 days ago and has pain in the right rib area.  He is breathing comfortably on room air.  He does have some issues swallowing his Colace capsules.  He continues to have chronic issues with dizziness upon standing, generalized weakness                              ALLERGIES:  is allergic to cymbalta [duloxetine hcl], duloxetine, and prednisone.  Meds: Current Outpatient Medications   Medication Sig Dispense Refill   acetaminophen (TYLENOL) 325 MG tablet Take 1.5 tablets (487.5 mg total) by mouth every 6 (six) hours as needed for mild pain, fever or headache (or Fever >/= 101).     albuterol (VENTOLIN HFA) 108 (90 Base) MCG/ACT inhaler Inhale 1-2 puffs into the lungs every 4 (four) hours as needed for wheezing or shortness of breath.     amLODipine (NORVASC) 5 MG tablet Take 1 tablet (5 mg total) by mouth daily. 30 tablet 0   apixaban (ELIQUIS) 2.5 MG TABS tablet Take 1 tablet (2.5 mg total) by mouth 2 (two) times daily. 60 tablet 0   atorvastatin (LIPITOR) 40 MG tablet Take 40 mg by mouth at bedtime.     Baclofen 5 MG TABS Take 10 mg by mouth at bedtime. (Patient taking differently: Take 5 mg by mouth at bedtime.) 60 tablet 3   bisacodyl (DULCOLAX) 10 MG suppository Place 1 suppository (10 mg total) rectally daily as needed for moderate constipation. 12 suppository 0   diazepam (VALIUM) 5 MG tablet Take 1 tablet (5 mg total) by mouth daily as needed for anxiety. 30 tablet 0   docusate sodium (COLACE) 100 MG capsule Take 100 mg by mouth 2 (two) times daily.     dronabinol (MARINOL) 5 MG capsule Take 1 capsule (5 mg total) by mouth 2 (two) times daily  before a meal. 60 capsule 2   DULoxetine (CYMBALTA) 30 MG capsule Take 30 mg by mouth daily.     HYDROcodone-acetaminophen (NORCO) 10-325 MG tablet Take 1 tablet by mouth every 4 (four) hours as needed. 50 tablet 0   levothyroxine (SYNTHROID) 50 MCG tablet Take 50 mcg by mouth daily.     metoprolol tartrate (LOPRESSOR) 25 MG tablet Take 12.5 mg by mouth 2 (two) times daily.     nitroGLYCERIN (NITROSTAT) 0.4 MG SL tablet Place 1 tablet (0.4 mg total) under the tongue every 5 (five) minutes as needed for chest pain (CP or SOB). 25 tablet 3   omeprazole (PRILOSEC) 40 MG capsule TAKE 1 CAPSULE(40 MG) BY MOUTH DAILY 90 capsule 0   ondansetron (ZOFRAN) 4 MG tablet Take 1 tablet (4 mg total) by mouth every 6 (six) hours as needed for  nausea. 20 tablet 0   polyethylene glycol (MIRALAX / GLYCOLAX) 17 g packet Take 17 g by mouth daily. 14 each 0   sennosides (SENOKOT) 8.8 MG/5ML syrup Take 5 mLs by mouth at bedtime. 240 mL 0   valproic acid (DEPAKENE) 250 MG/5ML solution TAKE 10 ML BY MOUTH THREE TIMES DAILY 900 mL 5   No current facility-administered medications for this encounter.    Physical Findings: The patient is in no acute distress. Patient is alert and oriented.  height is 6\' 1"  (1.854 m) and weight is 155 lb 6 oz (70.5 kg). His oral temperature is 98.4 F (36.9 C). His blood pressure is 111/70 and his pulse is 70. His respiration is 18 and oxygen saturation is 95%. .    Lungs are clear to auscultation bilaterally Skin is notable for darkening and dryness over the left posterior chest consistent with radiation changes Patient uses a wheelchair for transportation around the hospital today    Lab Findings: Lab Results  Component Value Date   WBC 8.5 09/11/2021   HGB 8.9 (L) 09/11/2021   HCT 26.1 (L) 09/11/2021   MCV 89.1 09/11/2021   PLT 155 09/11/2021    Radiographic Findings: No results found.  Impression/Plan: He is recovering well from retreatment with SBRT to the left lower lung tumor.  I recommended that he apply moisturizing lotion to his skin to heal it as he does show evidence of changes in the radiation field.  Since he is breathing comfortably on room air and his lungs are clear to auscultation bilaterally I do not think he needs imaging for possible rib fracture after he fell.  However if his pain gets worse he can contact his PCP.  He understands that rib fractures usually take a while to heal and he may have pain for some time.  He prefers not to undergo any work-up unless his pain worsens.  I will see him back in January with restaging CT scans of his neck and chest.  Patient prefers to get these after the holidays and I think this is reasonable.  I wished him the very best until we see each  other next year.  On date of service, in total, I spent 25 minutes on this encounter. Patient was seen in person.  _____________________________________   Eppie Gibson, MD

## 2021-10-28 NOTE — Progress Notes (Signed)
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 His last treatment was 09-21-21.   PAIN: 9/10 pain right rib area due to fall he had had home three to four day ago  RESPIRATORY: Was coughing up blood for three days but has improved now.  Pt is on room air.  SWALLOWING/DIET: Pt denies dysphagia He feels like pills get stuck. Not eating well. Was taking an pill to help boost po intake but has not taken lately.   OTHER: Ask about a liquid stool softener.   Nasal congestion and coughing are improving with a recent antibiotic.  Wt Readings from Last 3 Encounters:  10/28/21 155 lb 6 oz (70.5 kg)  09/07/21 163 lb 12.8 oz (74.3 kg)  09/02/21 162 lb 4 oz (73.6 kg)     He drinks ensure well but they constipate him. He is also sleeping a lot and feels weak most of the time. Doing his physical therapy. Pt reports dizziness when he stands up.

## 2021-11-01 ENCOUNTER — Other Ambulatory Visit: Payer: Self-pay

## 2021-11-01 ENCOUNTER — Ambulatory Visit: Payer: Medicare Other | Admitting: Adult Health

## 2021-11-01 DIAGNOSIS — C760 Malignant neoplasm of head, face and neck: Secondary | ICD-10-CM

## 2021-11-01 NOTE — Addendum Note (Signed)
Encounter addended by: Elza Rafter, RN on: 11/01/2021 9:24 AM  Actions taken: Visit diagnoses modified

## 2021-11-03 DIAGNOSIS — M545 Low back pain, unspecified: Secondary | ICD-10-CM | POA: Diagnosis not present

## 2021-11-03 DIAGNOSIS — M1990 Unspecified osteoarthritis, unspecified site: Secondary | ICD-10-CM | POA: Diagnosis not present

## 2021-11-03 DIAGNOSIS — I69351 Hemiplegia and hemiparesis following cerebral infarction affecting right dominant side: Secondary | ICD-10-CM | POA: Diagnosis not present

## 2021-11-03 DIAGNOSIS — Z7901 Long term (current) use of anticoagulants: Secondary | ICD-10-CM | POA: Diagnosis not present

## 2021-11-03 DIAGNOSIS — E7800 Pure hypercholesterolemia, unspecified: Secondary | ICD-10-CM | POA: Diagnosis not present

## 2021-11-03 DIAGNOSIS — D62 Acute posthemorrhagic anemia: Secondary | ICD-10-CM | POA: Diagnosis not present

## 2021-11-03 DIAGNOSIS — Z8589 Personal history of malignant neoplasm of other organs and systems: Secondary | ICD-10-CM | POA: Diagnosis not present

## 2021-11-03 DIAGNOSIS — C3490 Malignant neoplasm of unspecified part of unspecified bronchus or lung: Secondary | ICD-10-CM | POA: Diagnosis not present

## 2021-11-03 DIAGNOSIS — G8929 Other chronic pain: Secondary | ICD-10-CM | POA: Diagnosis not present

## 2021-11-03 DIAGNOSIS — Z931 Gastrostomy status: Secondary | ICD-10-CM | POA: Diagnosis not present

## 2021-11-03 DIAGNOSIS — I2510 Atherosclerotic heart disease of native coronary artery without angina pectoris: Secondary | ICD-10-CM | POA: Diagnosis not present

## 2021-11-03 DIAGNOSIS — S72142D Displaced intertrochanteric fracture of left femur, subsequent encounter for closed fracture with routine healing: Secondary | ICD-10-CM | POA: Diagnosis not present

## 2021-11-03 DIAGNOSIS — I10 Essential (primary) hypertension: Secondary | ICD-10-CM | POA: Diagnosis not present

## 2021-11-03 DIAGNOSIS — C321 Malignant neoplasm of supraglottis: Secondary | ICD-10-CM | POA: Diagnosis not present

## 2021-11-03 DIAGNOSIS — F32A Depression, unspecified: Secondary | ICD-10-CM | POA: Diagnosis not present

## 2021-11-03 DIAGNOSIS — K2100 Gastro-esophageal reflux disease with esophagitis, without bleeding: Secondary | ICD-10-CM | POA: Diagnosis not present

## 2021-11-03 DIAGNOSIS — Z7982 Long term (current) use of aspirin: Secondary | ICD-10-CM | POA: Diagnosis not present

## 2021-11-03 DIAGNOSIS — I6529 Occlusion and stenosis of unspecified carotid artery: Secondary | ICD-10-CM | POA: Diagnosis not present

## 2021-11-03 DIAGNOSIS — Z9181 History of falling: Secondary | ICD-10-CM | POA: Diagnosis not present

## 2021-11-03 NOTE — Progress Notes (Signed)
[facility] [address] [place]. [name] [address] [phone]       OFFICE FOLLOW UP NOTE  [name] Date of Birth:  Mar 07, 1960 Medical Record Number:  [id number]   Referring MD:  Irwin Brakeman  Reason for Referral:  Syncope versus seizure   Chief Complaint  Patient presents with   Follow-up    RM 3 with spouse and daughter in law  Pt is well, states he had a sz last week, no other ones.       HPI:   Update 11/07/2021 [name]: Patient returns for stroke follow-up visit after prior visit 8 months ago accompanied by his wife and daughter in law.  Reports since prior visit, he suffered a fall in September resulting in a left hip fracture which required surgical procedure and was d/c'd to rehab for 2 weeks. Per wife, during rehab stay, they were not giving him his Depakote 3 times daily as prescribed, a couple days after returning home, he had a seizure-like event. Reports sitting down on commode to urinate and upon trying to stand, he had significant difficulty holding himself up (if rails were not on commode, he would have fell over) and was confused, family member was able to get him into his bed and upon laying down, his eyes rolled back into his head, this lasted for a couple minutes, confusion persisted for a short time after, wife reports total episode lasted approximately 20 minutes.  He has not had any additional seizure-like events since that time, wife ensures he is taking his Depakote 500 mg 3 times daily as prescribed. No new stroke/TIA symptoms.  Remains on aspirin and atorvastatin.  Blood pressure well controlled.  Continues to have mild headaches, no significant improvement on baclofen and wife concerned that this is causing fatigue.  Continues to follow with oncology for recurrent LLE lung tumor, squamous cell carcinoma and recently completed retreatment with SBRT.  Has follow-up visit scheduled in January for restaging.  No further concerns at this  time.      History provided for reference purposes only Update 03/23/2021 [name]: Patient returns for follow-up visit after prior visit approximately 5 months ago.  Overall stable from neurological standpoint.  No additional syncopal events, remains on Depakote without side effects.  No new or reoccurring stroke/TIA symptoms. Compliant on aspirin and atorvastatin, denies side effects.  Blood pressure today 127/74.    Wife mentions he has been having headaches over the past 3 to 4 months, typically present upon awakening and at times can wake in the middle of the night. Located back of head, ache sensation, photophobia present, no phonophobia or N/V. Had used tylenol with some benefit. Has not tried any heat or ice. Wife does report that he sleeps a lot but sleep cycle is off, he will be up on and off throughout the night and sleep more during the day. Wife reports snoring and witnessed apneas.  He has not previously had sleep study.   Wife questions participating in physical therapy due to generalized deconditioning, being sedentary and limited physical activity or exercise.  He does ambulate short distance with cane, no recent falls.  Continues to closely follow with oncology for supraglottic squamous cell carcinoma currently in remission  Update 10/06/2020 [name]: Returns for 5-month follow-up accompanied by his wife, [name].  Overall doing well from a neurological standpoint.  Remains on Depakote without any additional seizure activity or loss of consciousness and tolerating without side effects.  Denies new or  reoccurring stroke/TIA symptoms.  Compliant on aspirin and atorvastatin without side effects.  Blood pressure today 116/74.  Recent carotid ultrasound showed R ICA 60-79% stenosis and L ICA 1-39% routinely monitored by Dr. Estanislado Pandy.  Wife is concerned regarding LE color changes (turns purpose per wife) after sitting for any length of time. Recently stubbed his toe on the side of the bed - wife  concerned regarding possible infection. Unable to be seen by PCP until 10/17 which is a VV but wife concerned he needs to be seen before then.  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  Routinely followed by oncology and pulmonology.  No further concerns at this time.   Update 07/21/2020 [name]: [name] returns for 60-month follow-up accompanied by his wife, [name], after prior consult visit with Dr. [name] for evaluation of loss of consciousness episodes possibly in setting syncope vs seizures.  MR brain did not showed stable appearance compared to prior imaging in 07/2018.  EEG showed mild slowing but no seizure activity.  He was started on valproic acid for possible seizures which he has remained on and has not had any additional loss of consciousness episodes.  He also reports resolution of diarrhea and improvement of appetite.  Stable from stroke standpoint without new stroke/TIA symptoms. Compliant on aspirin and atorvastatin without associated side effects. Blood pressure today 91/61. Ambulates without assistive device indoors normally but occasionally will use a rolling walker. Use of w/c long distance.   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  He plans on undergoing bronchoscopy currently scheduled 7/25 for further diagnosis.  He routinely follows with oncology and pulmonology.  Consult visit 05/06/2020 Dr. [name]: [name] is a 61 year old Caucasian male is referred today for evaluation for episodes of brief loss of consciousness.  History is obtained from the patient and his wife and daughter eyewitness as well as review of electronic medical records and personally  reviewed pertinent imaging films in PACS.  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  He has had multiple recent hospitalizations mostly related to dehydration and diarrhea and several ER visits.  The patient and wife however report that he has had multiple episodes of brief loss of consciousness.  Episodes are stereotypical with the patient's turning his neck upwards with eyes rolling back the patient been briefly unresponsive.  He usually complains of eyes being fuzzy and needing to sit down.  At times he can hold on and make it other times he just falls down and needs to be caught otherwise he will hit himself.  Episodes of brief and regains consciousness quickly.  Patient however does not remember the episode slated for a while though is not confused or disoriented.  There have been no witnessed tonic-clonic activity, tongue bite or incontinence.  This episode started in 2020 settle down after this for a few years but in the last few months episodes seem to have occurred again and now occur at variable frequency from several a day to once every few days.  Patient underwent a CT angiogram of the brain and neck on one of the recent  ER admissions on 04/30/2020 which showed 60% stenosis of the right internal carotid artery at the bulb and 60% stenosis of left ICA at the skull base as well as moderate 50% bilateral carotid siphon stenosis.  Both vertebral arteries were occluded beyond the origin of the PICA.  These findings were not significantly worse compared with previous CT angiogram from 03/04/2019.   He has remote history of bilateral pontine strokes in July 2020 when MRI showed R > L pontine infarcts.  MRA showed occlusion bilateral VA's.  CTA head/neck showed bilateral V4 stenosis with occlusion distal VA and BA distally, right ICA 70% and left ICA  65% narrowing, distal left ICA 60% at cervical petrous junction, and severe ICA atherosclerosis, left CCA 40 to 50% stenosis, right VA 50% stenosis, bilateral V2 60 to 70% stenosis and left subclavian 65% stenosis.  Underwent cerebral angiogram by Dr. 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  No further interventions recommended at that time.  He also has history ofHeavy EtOH use and substance use with cessation counseling provided.  Other stroke risk factors include former tobacco use, family history of stroke and CAD s/p DES 2014.    Patient has not been evaluated for seizures with recent EEG or brain imaging studies.  He denies prior history of seizures.  He has an appointment with a gastroenterologist in a few weeks and with his cancer doctor as well to discuss more treatment for his what appears to be recurrent cancer.    ROS:   14 system review of systems is positive for those listed in HPI and other systems negative  PMH:  Past Medical History:  Diagnosis Date   Anemia    Anxiety    Arthritis    Cancer of larynx (HCC)    Carotid artery stenosis    Chronic lower back pain    Coronary artery disease    a. Diag cath 10/2012 for CP/abnormal nuc -> PCI s/p LAD atherectomy/DES placement 11/05/12.   Depression    Dilated aortic root (HCC)    GERD (gastroesophageal reflux disease)    History of hiatal hernia    Hypercholesteremia    Hypertension    Lung cancer (HCC)    Pneumonia    S/P percutaneous endoscopic gastrostomy (PEG) tube placement [facility])    Stroke ([place]) 07/31/2018   Pons    Social History:  Social History   Socioeconomic History   Marital status: Married    Spouse name: [name]   Number of children: 0   Years of education: Not on file   Highest education level: Not on file  Occupational History   Occupation: Disabled    Comment: previously worked in Nurse, learning disability as an Clinical biochemist   Tobacco Use   Smoking status: Former    Packs/day: 3.00    Years: 32.00    Total pack years: 96.00    Types: Cigarettes    Start date: 08/29/1977    Quit date: 11/29/2006    Years since quitting: 14.9   Smokeless tobacco: Former    Types: Chew    Quit date: 2003   Tobacco comments:    11/05/2012 "chewed tobacco when I play ball; haven't chewed since age 35"  Vaping Use   Vaping Use: Never used  Substance and Sexual Activity   Alcohol use: Not Currently    Alcohol/week: 12.0 standard drinks of alcohol    Types: 12 Cans of beer per week  Comment: per wife [name] 6-7/week now as of 02/24/19   Drug use: Not Currently    Types: Marijuana    Comment: Last use was on - denies on 04/08/19   Sexual activity: Not on file  Other Topics Concern   Not on file  Social History Narrative   Lives with wife at home   Right Handed   Drinks 5-6 cups caffeine daily   Social Determinants of Health   Financial Resource Strain: Low Risk  (01/21/2019)   Overall Financial Resource Strain (CARDIA)    Difficulty of Paying Living Expenses: Not hard at all  Food Insecurity: No Food Insecurity (09/07/2021)   Hunger Vital Sign    Worried About Running Out of Food in the Last Year: Never true    Ran Out of Food in the Last Year: Never true  Transportation Needs: No Transportation Needs (09/07/2021)   PRAPARE - Hydrologist (Medical): No    Lack of Transportation (Non-Medical): No  Physical Activity: Insufficiently Active (01/21/2019)   Exercise Vital Sign    Days of Exercise per Week: 2 days    Minutes of Exercise per Session: 30 min  Stress: Stress Concern Present (01/21/2019)   [place]    Feeling of Stress : To some extent  Social Connections: Socially Isolated (01/21/2019)   Social Connection and Isolation Panel [NHANES]    Frequency of Communication with Friends and Family: Once a week    Frequency of  Social Gatherings with Friends and Family: Once a week    Attends Religious Services: Never    Marine scientist or Organizations: No    Attends Archivist Meetings: Never    Marital Status: Married  Human resources officer Violence: Not At Risk (09/07/2021)   Humiliation, Afraid, Rape, and Kick questionnaire    Fear of Current or Ex-Partner: No    Emotionally Abused: No    Physically Abused: No    Sexually Abused: No    Medications:   Current Outpatient Medications on File Prior to Visit  Medication Sig Dispense Refill   acetaminophen (TYLENOL) 325 MG tablet Take 1.5 tablets (487.5 mg total) by mouth every 6 (six) hours as needed for mild pain, fever or headache (or Fever >/= 101).     albuterol (VENTOLIN HFA) 108 (90 Base) MCG/ACT inhaler Inhale 1-2 puffs into the lungs every 4 (four) hours as needed for wheezing or shortness of breath.     atorvastatin (LIPITOR) 40 MG tablet Take 40 mg by mouth daily.     Baclofen 5 MG TABS Take 10 mg by mouth at bedtime. (Patient taking differently: Take 5 mg by mouth at bedtime.) 60 tablet 3   bisacodyl (DULCOLAX) 10 MG suppository Place 1 suppository (10 mg total) rectally daily as needed for moderate constipation. 12 suppository 0   diazepam (VALIUM) 5 MG tablet Take 1 tablet (5 mg total) by mouth daily as needed for anxiety. 30 tablet 0   docusate sodium (COLACE) 100 MG capsule Take 100 mg by mouth 2 (two) times daily.     dronabinol (MARINOL) 5 MG capsule Take 1 capsule (5 mg total) by mouth 2 (two) times daily before a meal. 60 capsule 2   DULoxetine (CYMBALTA) 30 MG capsule Take 30 mg by mouth daily.     HYDROcodone-acetaminophen (NORCO) 10-325 MG tablet Take 1 tablet by mouth every 4 (four) hours as needed. 50 tablet 0   levothyroxine (SYNTHROID)  50 MCG tablet Take 50 mcg by mouth daily.     metoprolol tartrate (LOPRESSOR) 25 MG tablet Take 12.5 mg by mouth 2 (two) times daily.     nitroGLYCERIN (NITROSTAT) 0.4 MG SL tablet Place 1  tablet (0.4 mg total) under the tongue every 5 (five) minutes as needed for chest pain (CP or SOB). 25 tablet 3   omeprazole (PRILOSEC) 40 MG capsule TAKE 1 CAPSULE(40 MG) BY MOUTH DAILY 90 capsule 0   ondansetron (ZOFRAN) 4 MG tablet Take 1 tablet (4 mg total) by mouth every 6 (six) hours as needed for nausea. 20 tablet 0   polyethylene glycol (MIRALAX / GLYCOLAX) 17 g packet Take 17 g by mouth daily. 14 each 0   sennosides (SENOKOT) 8.8 MG/5ML syrup Take 5 mLs by mouth at bedtime. 240 mL 0   valproic acid (DEPAKENE) 250 MG/5ML solution TAKE 10 ML BY MOUTH THREE TIMES DAILY 900 mL 5   [DISCONTINUED] prochlorperazine (COMPAZINE) 10 MG tablet TAKE 1 TABLET(10 MG) BY MOUTH EVERY 6 HOURS AS NEEDED FOR NAUSEA 60 tablet 2   No current facility-administered medications on file prior to visit.    Allergies:   Allergies  Allergen Reactions   Cymbalta [Duloxetine Hcl]    Duloxetine Other (See Comments)    Caused depression/ patient is taking   Prednisone Other (See Comments)    Chest pain    Physical Exam Today's Vitals   11/07/21 1108  BP: 93/60  Pulse: 76  Height: 6' (1.829 m)    Body mass index is 21.07 kg/m.  General: Frail malnourished looking very pleasant middle-aged Caucasian male seated, in no evident distress Head: head normocephalic and atraumatic.   Neck: supple with no carotid or supraclavicular bruits Cardiovascular: regular rate and rhythm, no murmurs Musculoskeletal: full neck ROM although limited looking towards left side. Moderate tightness along upper trapezius muscle on left side Skin:  no rash/petichiae   Neurologic Exam Mental Status: Awake and fully alert.  Fluent speech and language.  Oriented to place and time. Recent and remote memory intact. Attention span, concentration and fund of knowledge appropriate. Mood and affect appropriate.  Cranial Nerves: Pupils equal, briskly reactive to light. Extraocular movements full without nystagmus. Visual fields full to  confrontation. Hearing intact. Facial sensation intact. Face, tongue, palate moves normally and symmetrically.  Motor: Normal bulk and tone. Normal strength in all tested extremity muscles except unable to test left HF d/t prior hip injury Sensory.: intact to touch , pinprick , position and vibratory sensation.  Coordination: Rapid alternating movements normal in all extremities. Finger-to-nose and heel-to-shin performed accurately bilaterally. Gait and Station: Deferred  Reflexes: 1+ and symmetric. Toes downgoing.       ASSESSMENT:[name] is a 61 y.o. year old male with hx of R>L pontine infarcts on 07/31/2018 secondary to large vessel disease source. Vascular risk factors include uncontrolled HTN, HLD, CAD, intra-and extracranial stenosis heavy EtOH use and substance abuse. dx'd malignant neoplasm of supraglottis 01/2019 s/p chemo and XRT with evidence of lung nodules increasing in size and pathology consistent with malignancy s/p XRT.  Multiple episodes of brief loss of consciousness with memory loss possibly syncopal events in the setting of dehydration from chronic diarrhea and significant occlusive extracranial intracranial disease though complex partial seizures are possible and have since subsided after initiating valproic acid.     PLAN:  Loss of consciousness -additional event back in September, question due to lack of adequate Depakote dosing during SNF rehab stay post hip  fracture, no additional events since that time -plan to continue Depakote 500 mg suspension 3 times daily for now, advised to call with any additional events and will consider increasing dosage if needed -Valproic acid level 10/2020 83 - repeat today -EEG 06/2020 mild slowing but no evidence of seizure activity -MR brain 05/2020 no significant change compared to prior MRI in 07/2018  2. Hx of stroke -Continue aspirin and atorvastatin for secondary stroke prevention -Continue follow-up with PCP for aggressive  stroke risk factor management including HTN with BP goal<130/90 and HLD with LDL goal<70  3.  Cervical strain 4.  Posterior headaches -moderate tightness of upper trapezius on left side appearing musculoskeletal in origin. No red flag symptoms or exam findings.  No radiculopathy or traumatic event -likely neck pain contributing to posterior headaches -Advised use of baclofen 5 mg nightly PRN as wife believes nightly use has been causing fatigue although discussed fatigue issue is likely multifactorial -use of heat and gentle stretching exercises -Discussed importance of proper positioning and posture and good pillow support while sleeping  5.  Carotid stenosis -repeat carotid ultrasound for surveillance monitoring -carotid ultrasound 09/2020 right ICA 60 to 79% stenosis and left ICA 1 to 39% stenosis -Monitored and managed by Dr. Estanislado Pandy - if progression on repeat US, will reach out to IR to schedule f/u visit    Follow-up in 5 months or call earlier if needed    CC:  [name], MD    I spent 46 minutes of face-to-face and non-face-to-face time with patient and family.  This included previsit chart review, lab review, study review, electronic health record documentation, and patient and family discussion and education regarding above diagnoses and treatment plan and answered all the questions to patient and family satisfaction  Frann Rider, [facility]  [facility] [address] [place], [place] [address]  Phone [phone] Fax [phone] Note: This document was prepared with digital dictation and possible smart phrase technology. Any transcriptional errors that result from this process are unintentional.

## 2021-11-04 DIAGNOSIS — S72142D Displaced intertrochanteric fracture of left femur, subsequent encounter for closed fracture with routine healing: Secondary | ICD-10-CM | POA: Diagnosis not present

## 2021-11-04 DIAGNOSIS — K2100 Gastro-esophageal reflux disease with esophagitis, without bleeding: Secondary | ICD-10-CM | POA: Diagnosis not present

## 2021-11-04 DIAGNOSIS — I6529 Occlusion and stenosis of unspecified carotid artery: Secondary | ICD-10-CM | POA: Diagnosis not present

## 2021-11-04 DIAGNOSIS — D62 Acute posthemorrhagic anemia: Secondary | ICD-10-CM | POA: Diagnosis not present

## 2021-11-04 DIAGNOSIS — E7800 Pure hypercholesterolemia, unspecified: Secondary | ICD-10-CM | POA: Diagnosis not present

## 2021-11-04 DIAGNOSIS — C321 Malignant neoplasm of supraglottis: Secondary | ICD-10-CM | POA: Diagnosis not present

## 2021-11-04 DIAGNOSIS — Z9181 History of falling: Secondary | ICD-10-CM | POA: Diagnosis not present

## 2021-11-04 DIAGNOSIS — I2510 Atherosclerotic heart disease of native coronary artery without angina pectoris: Secondary | ICD-10-CM | POA: Diagnosis not present

## 2021-11-04 DIAGNOSIS — M545 Low back pain, unspecified: Secondary | ICD-10-CM | POA: Diagnosis not present

## 2021-11-04 DIAGNOSIS — I10 Essential (primary) hypertension: Secondary | ICD-10-CM | POA: Diagnosis not present

## 2021-11-04 DIAGNOSIS — M1990 Unspecified osteoarthritis, unspecified site: Secondary | ICD-10-CM | POA: Diagnosis not present

## 2021-11-04 DIAGNOSIS — G8929 Other chronic pain: Secondary | ICD-10-CM | POA: Diagnosis not present

## 2021-11-04 DIAGNOSIS — I69351 Hemiplegia and hemiparesis following cerebral infarction affecting right dominant side: Secondary | ICD-10-CM | POA: Diagnosis not present

## 2021-11-04 DIAGNOSIS — Z8589 Personal history of malignant neoplasm of other organs and systems: Secondary | ICD-10-CM | POA: Diagnosis not present

## 2021-11-04 DIAGNOSIS — Z7901 Long term (current) use of anticoagulants: Secondary | ICD-10-CM | POA: Diagnosis not present

## 2021-11-04 DIAGNOSIS — Z7982 Long term (current) use of aspirin: Secondary | ICD-10-CM | POA: Diagnosis not present

## 2021-11-04 DIAGNOSIS — Z931 Gastrostomy status: Secondary | ICD-10-CM | POA: Diagnosis not present

## 2021-11-04 DIAGNOSIS — F32A Depression, unspecified: Secondary | ICD-10-CM | POA: Diagnosis not present

## 2021-11-04 DIAGNOSIS — C3490 Malignant neoplasm of unspecified part of unspecified bronchus or lung: Secondary | ICD-10-CM | POA: Diagnosis not present

## 2021-11-07 ENCOUNTER — Ambulatory Visit: Payer: Medicare Other | Admitting: Adult Health

## 2021-11-07 ENCOUNTER — Encounter: Payer: Self-pay | Admitting: Adult Health

## 2021-11-07 VITALS — BP 93/60 | HR 76 | Ht 72.0 in

## 2021-11-07 DIAGNOSIS — R569 Unspecified convulsions: Secondary | ICD-10-CM

## 2021-11-07 DIAGNOSIS — I6523 Occlusion and stenosis of bilateral carotid arteries: Secondary | ICD-10-CM

## 2021-11-07 DIAGNOSIS — Z5181 Encounter for therapeutic drug level monitoring: Secondary | ICD-10-CM

## 2021-11-07 DIAGNOSIS — I639 Cerebral infarction, unspecified: Secondary | ICD-10-CM

## 2021-11-07 DIAGNOSIS — M542 Cervicalgia: Secondary | ICD-10-CM

## 2021-11-07 NOTE — Patient Instructions (Addendum)
Continue Depakote 500 mg 3 times daily for seizure prevention -please call with any additional seizure activity.  We will check Depakote levels today  You will be called to complete a carotid ultrasound for monitoring of your carotid stenosis (narrowing)  Can decrease use of baclofen and just use as needed for now to see if this helps with fatigue  Continue aspirin 81 mg daily  and atorvastatin for secondary stroke prevention  Continue to follow up with PCP regarding blood pressure and cholesterol management  Maintain strict control of hypertension with blood pressure goal below 130/90 and cholesterol with LDL cholesterol (bad cholesterol) goal below 70 mg/dL.   Signs of a Stroke? Follow the BEFAST method:  Balance Watch for a sudden loss of balance, trouble with coordination or vertigo Eyes Is there a sudden loss of vision in one or both eyes? Or double vision?  Face: Ask the person to smile. Does one side of the face droop or is it numb?  Arms: Ask the person to raise both arms. Does one arm drift downward? Is there weakness or numbness of a leg? Speech: Ask the person to repeat a simple phrase. Does the speech sound slurred/strange? Is the person confused ? Time: If you observe any of these signs, call 911.     Followup in the future with me in 5 months or call earlier if needed       Thank you for coming to see [place] at [facility]. I hope we have been able to provide you high quality care today.  You may receive a patient satisfaction survey over the next few weeks. We would appreciate your feedback and comments so that we may continue to improve ourselves and the health of our patients.

## 2021-11-08 LAB — VALPROIC ACID LEVEL: Valproic Acid Lvl: 53 ug/mL (ref 50–100)

## 2021-11-09 DIAGNOSIS — S72142D Displaced intertrochanteric fracture of left femur, subsequent encounter for closed fracture with routine healing: Secondary | ICD-10-CM | POA: Diagnosis not present

## 2021-11-10 DIAGNOSIS — I69351 Hemiplegia and hemiparesis following cerebral infarction affecting right dominant side: Secondary | ICD-10-CM | POA: Diagnosis not present

## 2021-11-10 DIAGNOSIS — C321 Malignant neoplasm of supraglottis: Secondary | ICD-10-CM | POA: Diagnosis not present

## 2021-11-10 DIAGNOSIS — I6529 Occlusion and stenosis of unspecified carotid artery: Secondary | ICD-10-CM | POA: Diagnosis not present

## 2021-11-10 DIAGNOSIS — E7800 Pure hypercholesterolemia, unspecified: Secondary | ICD-10-CM | POA: Diagnosis not present

## 2021-11-10 DIAGNOSIS — Z7901 Long term (current) use of anticoagulants: Secondary | ICD-10-CM | POA: Diagnosis not present

## 2021-11-10 DIAGNOSIS — G8929 Other chronic pain: Secondary | ICD-10-CM | POA: Diagnosis not present

## 2021-11-10 DIAGNOSIS — M1990 Unspecified osteoarthritis, unspecified site: Secondary | ICD-10-CM | POA: Diagnosis not present

## 2021-11-10 DIAGNOSIS — Z7982 Long term (current) use of aspirin: Secondary | ICD-10-CM | POA: Diagnosis not present

## 2021-11-10 DIAGNOSIS — D62 Acute posthemorrhagic anemia: Secondary | ICD-10-CM | POA: Diagnosis not present

## 2021-11-10 DIAGNOSIS — F32A Depression, unspecified: Secondary | ICD-10-CM | POA: Diagnosis not present

## 2021-11-10 DIAGNOSIS — M545 Low back pain, unspecified: Secondary | ICD-10-CM | POA: Diagnosis not present

## 2021-11-10 DIAGNOSIS — K2100 Gastro-esophageal reflux disease with esophagitis, without bleeding: Secondary | ICD-10-CM | POA: Diagnosis not present

## 2021-11-10 DIAGNOSIS — C3490 Malignant neoplasm of unspecified part of unspecified bronchus or lung: Secondary | ICD-10-CM | POA: Diagnosis not present

## 2021-11-10 DIAGNOSIS — Z931 Gastrostomy status: Secondary | ICD-10-CM | POA: Diagnosis not present

## 2021-11-10 DIAGNOSIS — Z9181 History of falling: Secondary | ICD-10-CM | POA: Diagnosis not present

## 2021-11-10 DIAGNOSIS — S72142D Displaced intertrochanteric fracture of left femur, subsequent encounter for closed fracture with routine healing: Secondary | ICD-10-CM | POA: Diagnosis not present

## 2021-11-10 DIAGNOSIS — I10 Essential (primary) hypertension: Secondary | ICD-10-CM | POA: Diagnosis not present

## 2021-11-10 DIAGNOSIS — Z8589 Personal history of malignant neoplasm of other organs and systems: Secondary | ICD-10-CM | POA: Diagnosis not present

## 2021-11-10 DIAGNOSIS — I2510 Atherosclerotic heart disease of native coronary artery without angina pectoris: Secondary | ICD-10-CM | POA: Diagnosis not present

## 2021-11-14 ENCOUNTER — Ambulatory Visit: Payer: Medicare Other | Attending: Adult Health

## 2021-11-14 DIAGNOSIS — S72142D Displaced intertrochanteric fracture of left femur, subsequent encounter for closed fracture with routine healing: Secondary | ICD-10-CM | POA: Diagnosis not present

## 2021-11-14 DIAGNOSIS — Z9181 History of falling: Secondary | ICD-10-CM | POA: Diagnosis not present

## 2021-11-14 DIAGNOSIS — M1990 Unspecified osteoarthritis, unspecified site: Secondary | ICD-10-CM | POA: Diagnosis not present

## 2021-11-14 DIAGNOSIS — I69351 Hemiplegia and hemiparesis following cerebral infarction affecting right dominant side: Secondary | ICD-10-CM | POA: Diagnosis not present

## 2021-11-14 DIAGNOSIS — I6523 Occlusion and stenosis of bilateral carotid arteries: Secondary | ICD-10-CM | POA: Diagnosis not present

## 2021-11-14 DIAGNOSIS — Z931 Gastrostomy status: Secondary | ICD-10-CM | POA: Diagnosis not present

## 2021-11-14 DIAGNOSIS — D62 Acute posthemorrhagic anemia: Secondary | ICD-10-CM | POA: Diagnosis not present

## 2021-11-14 DIAGNOSIS — Z7982 Long term (current) use of aspirin: Secondary | ICD-10-CM | POA: Diagnosis not present

## 2021-11-14 DIAGNOSIS — I2510 Atherosclerotic heart disease of native coronary artery without angina pectoris: Secondary | ICD-10-CM | POA: Diagnosis not present

## 2021-11-14 DIAGNOSIS — Z8589 Personal history of malignant neoplasm of other organs and systems: Secondary | ICD-10-CM | POA: Diagnosis not present

## 2021-11-14 DIAGNOSIS — M545 Low back pain, unspecified: Secondary | ICD-10-CM | POA: Diagnosis not present

## 2021-11-14 DIAGNOSIS — E7800 Pure hypercholesterolemia, unspecified: Secondary | ICD-10-CM | POA: Diagnosis not present

## 2021-11-14 DIAGNOSIS — C321 Malignant neoplasm of supraglottis: Secondary | ICD-10-CM | POA: Diagnosis not present

## 2021-11-14 DIAGNOSIS — K2100 Gastro-esophageal reflux disease with esophagitis, without bleeding: Secondary | ICD-10-CM | POA: Diagnosis not present

## 2021-11-14 DIAGNOSIS — C3490 Malignant neoplasm of unspecified part of unspecified bronchus or lung: Secondary | ICD-10-CM | POA: Diagnosis not present

## 2021-11-14 DIAGNOSIS — I10 Essential (primary) hypertension: Secondary | ICD-10-CM | POA: Diagnosis not present

## 2021-11-14 DIAGNOSIS — G8929 Other chronic pain: Secondary | ICD-10-CM | POA: Diagnosis not present

## 2021-11-14 DIAGNOSIS — F32A Depression, unspecified: Secondary | ICD-10-CM | POA: Diagnosis not present

## 2021-11-14 DIAGNOSIS — I6529 Occlusion and stenosis of unspecified carotid artery: Secondary | ICD-10-CM | POA: Diagnosis not present

## 2021-11-14 DIAGNOSIS — Z7901 Long term (current) use of anticoagulants: Secondary | ICD-10-CM | POA: Diagnosis not present

## 2021-11-25 DIAGNOSIS — I69351 Hemiplegia and hemiparesis following cerebral infarction affecting right dominant side: Secondary | ICD-10-CM | POA: Diagnosis not present

## 2021-11-25 DIAGNOSIS — Z7982 Long term (current) use of aspirin: Secondary | ICD-10-CM | POA: Diagnosis not present

## 2021-11-25 DIAGNOSIS — M1990 Unspecified osteoarthritis, unspecified site: Secondary | ICD-10-CM | POA: Diagnosis not present

## 2021-11-25 DIAGNOSIS — C321 Malignant neoplasm of supraglottis: Secondary | ICD-10-CM | POA: Diagnosis not present

## 2021-11-25 DIAGNOSIS — E7800 Pure hypercholesterolemia, unspecified: Secondary | ICD-10-CM | POA: Diagnosis not present

## 2021-11-25 DIAGNOSIS — D62 Acute posthemorrhagic anemia: Secondary | ICD-10-CM | POA: Diagnosis not present

## 2021-11-25 DIAGNOSIS — G8929 Other chronic pain: Secondary | ICD-10-CM | POA: Diagnosis not present

## 2021-11-25 DIAGNOSIS — Z8589 Personal history of malignant neoplasm of other organs and systems: Secondary | ICD-10-CM | POA: Diagnosis not present

## 2021-11-25 DIAGNOSIS — I10 Essential (primary) hypertension: Secondary | ICD-10-CM | POA: Diagnosis not present

## 2021-11-25 DIAGNOSIS — S72142D Displaced intertrochanteric fracture of left femur, subsequent encounter for closed fracture with routine healing: Secondary | ICD-10-CM | POA: Diagnosis not present

## 2021-11-25 DIAGNOSIS — F32A Depression, unspecified: Secondary | ICD-10-CM | POA: Diagnosis not present

## 2021-11-25 DIAGNOSIS — C3490 Malignant neoplasm of unspecified part of unspecified bronchus or lung: Secondary | ICD-10-CM | POA: Diagnosis not present

## 2021-11-25 DIAGNOSIS — I2510 Atherosclerotic heart disease of native coronary artery without angina pectoris: Secondary | ICD-10-CM | POA: Diagnosis not present

## 2021-11-25 DIAGNOSIS — K2100 Gastro-esophageal reflux disease with esophagitis, without bleeding: Secondary | ICD-10-CM | POA: Diagnosis not present

## 2021-11-25 DIAGNOSIS — I6529 Occlusion and stenosis of unspecified carotid artery: Secondary | ICD-10-CM | POA: Diagnosis not present

## 2021-11-25 DIAGNOSIS — Z931 Gastrostomy status: Secondary | ICD-10-CM | POA: Diagnosis not present

## 2021-11-25 DIAGNOSIS — Z7901 Long term (current) use of anticoagulants: Secondary | ICD-10-CM | POA: Diagnosis not present

## 2021-11-25 DIAGNOSIS — M545 Low back pain, unspecified: Secondary | ICD-10-CM | POA: Diagnosis not present

## 2021-11-25 DIAGNOSIS — Z9181 History of falling: Secondary | ICD-10-CM | POA: Diagnosis not present

## 2021-12-01 DIAGNOSIS — M4306 Spondylolysis, lumbar region: Secondary | ICD-10-CM | POA: Diagnosis not present

## 2021-12-01 DIAGNOSIS — M549 Dorsalgia, unspecified: Secondary | ICD-10-CM | POA: Diagnosis not present

## 2021-12-01 DIAGNOSIS — G894 Chronic pain syndrome: Secondary | ICD-10-CM | POA: Diagnosis not present

## 2021-12-06 ENCOUNTER — Encounter: Payer: Self-pay | Admitting: *Deleted

## 2021-12-06 NOTE — Progress Notes (Signed)
Approval received from [place] for Dronabinol 12/05/2021.

## 2021-12-13 IMAGING — CT NM PET TUM IMG INITIAL (PI) SKULL BASE T - THIGH
9 series · 25 of 25 positions shown · non-contrast
Comparison: None.

CLINICAL DATA: Initial treatment strategy for head neck cancer.

EXAM:
NUCLEAR MEDICINE PET SKULL BASE TO THIGH
TECHNIQUE: 8.9 mCi F-18 FDG was injected intravenously. Full-ring PET imaging
was performed from the skull base to thigh after the radiotracer. CT
data was obtained and used for attenuation correction and anatomic
localization.
Fasting blood glucose: 111 mg/dl

[Series 3: pet hn_sk_thigh ac · axial · 5.0mm · 4.07mm/px · z∈[-931,+41]mm · 4 of 244 slices shown]
[im 1/244]
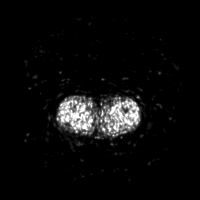
[im 82/244]
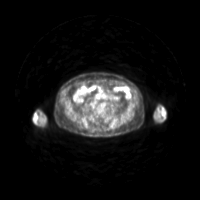
[im 163/244]
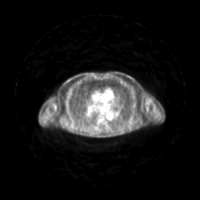
[im 244/244]
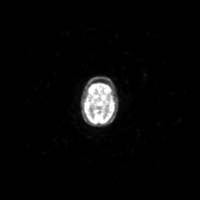

[Series 4: ct hn_sk_th 5.0 hd_fov · axial · 5.0mm · 1.07mm/px · z∈[-931,+41]mm · 5 of 244 slices shown]
[im 1/244]
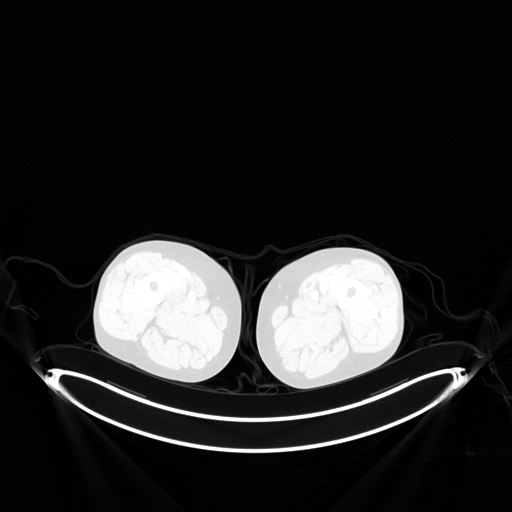
[im 61/244]
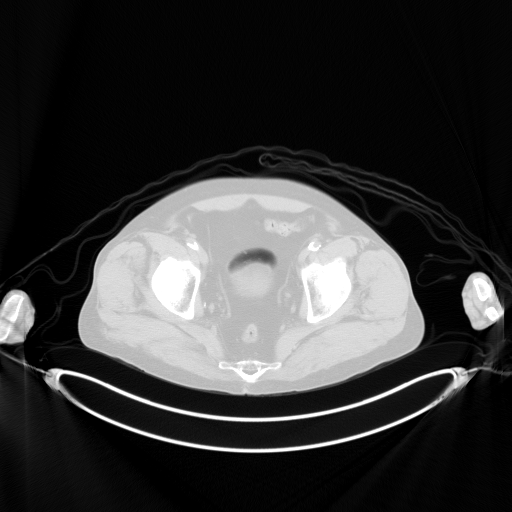
[im 122/244]
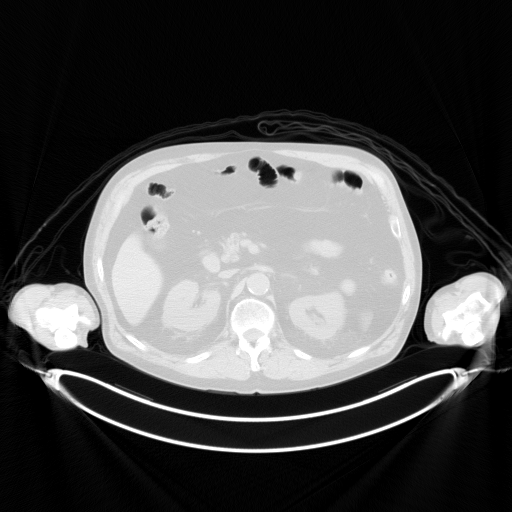
[im 183/244]
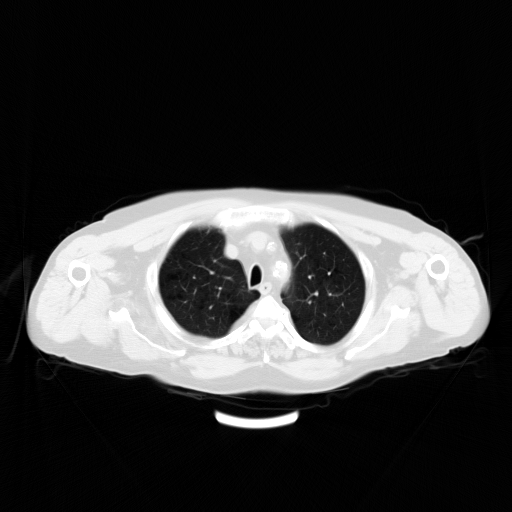
[im 244/244  brain]
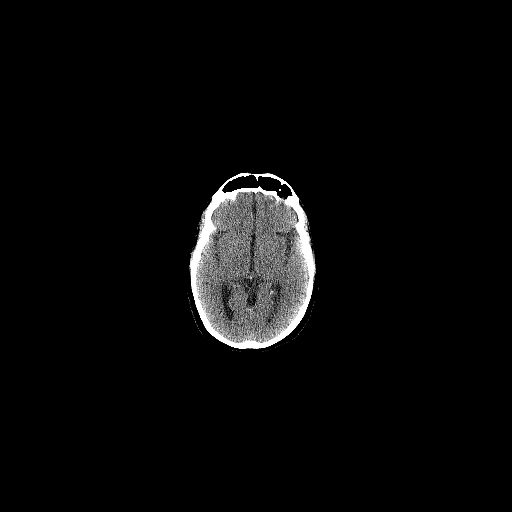

[Series 5: pet hn_sk_thigh nac · axial · 5.0mm · 4.07mm/px · z∈[-931,+41]mm · 5 of 244 slices shown]
[im 1/244]
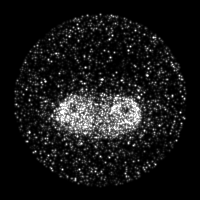
[im 61/244]
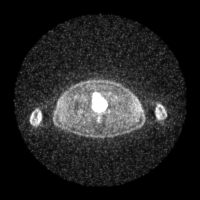
[im 122/244]
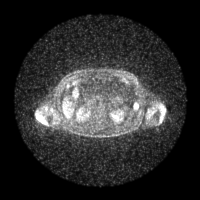
[im 183/244]
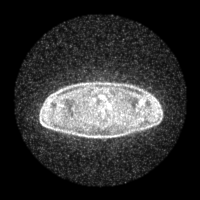
[im 244/244]
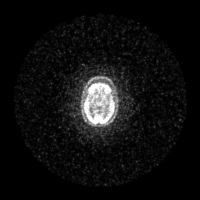

[Series 8: ct hn_sk_th 5.0 (id) lung_bone · axial · 5.0mm · 0.63mm/px · 1 of 62 slices shown]
[im 1/62  bone]
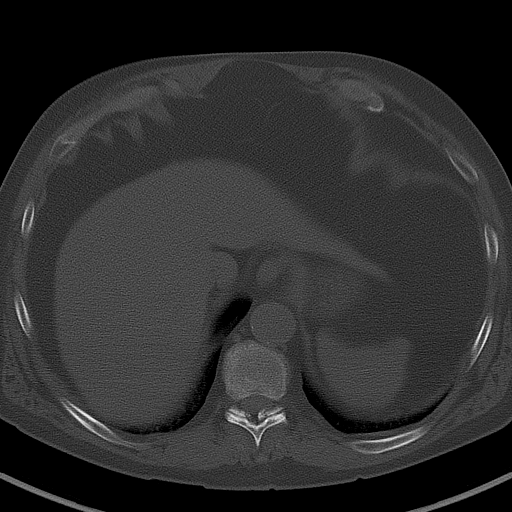

[Series 603: range-ct hn_sk_th 5.0 hd_fov-cor-<alpha range> · 2 of 77 slices shown]
[im 1/77]
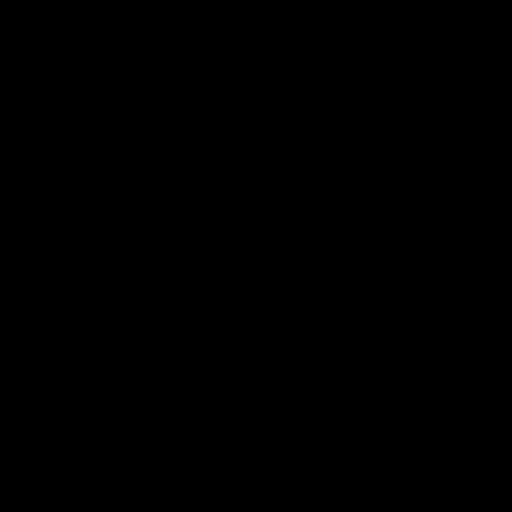
[im 77/77]
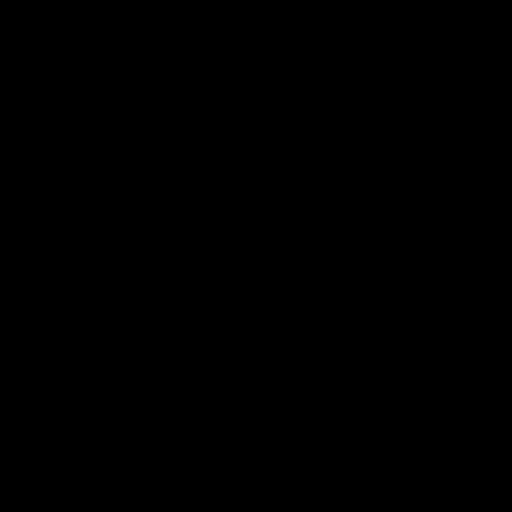

[Series 604: mip range 2 · coronal · 2.02mm/px · 1 of 32 slices shown]
[im 1/32]
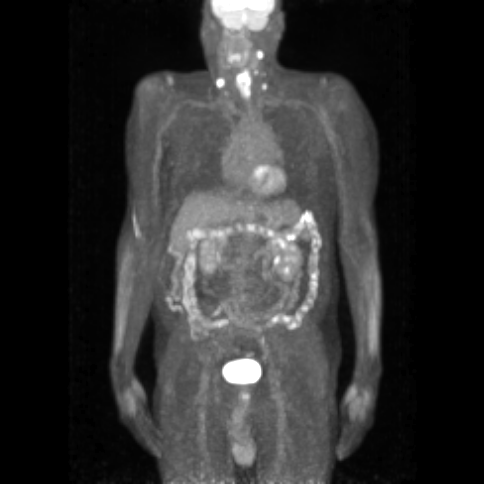

[Series 605: range-ct hn_sk_th 5.0 hd_fov-tra-<alpha range> · 5 of 211 slices shown]
[im 1/211]
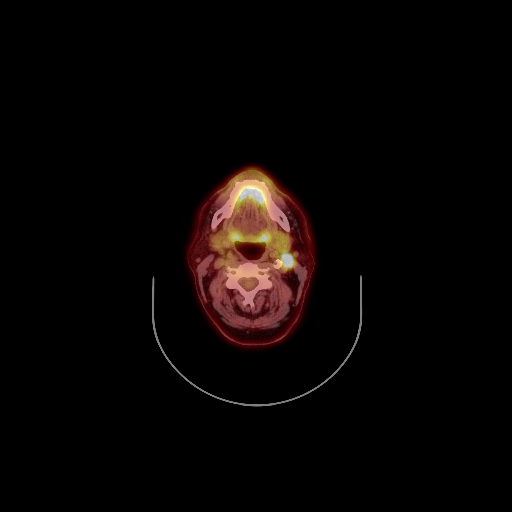
[im 53/211]
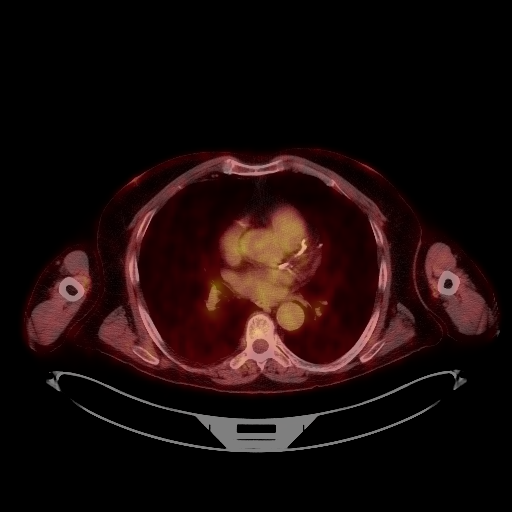
[im 106/211]
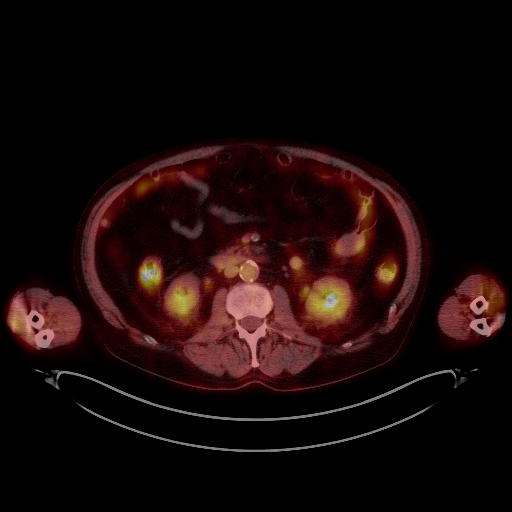
[im 158/211]
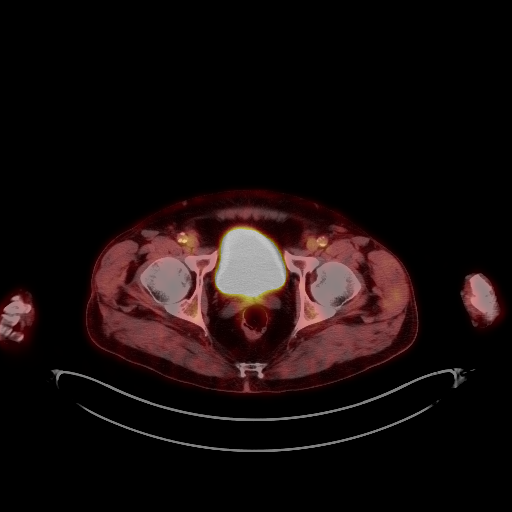
[im 211/211]
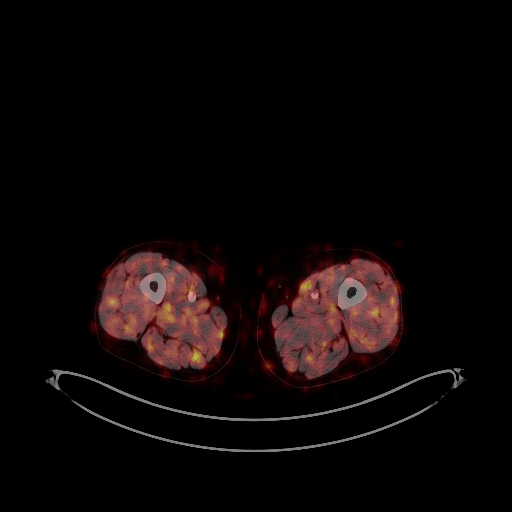

[Series 1058: results mm oncology reading · 4.0mm · 1.10mm/px · 1 of 3 slices shown (1 of 2)]
[im 1/3]
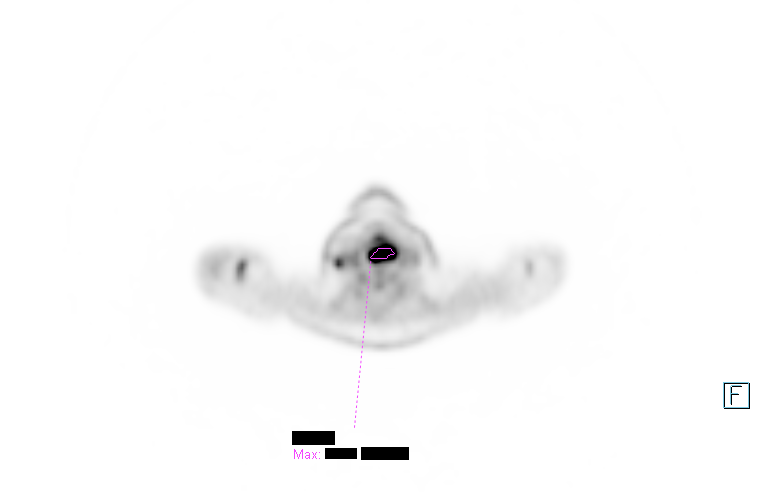

[Series 1238: results mm oncology reading · 5.0mm · 0.85mm/px · 1 of 1 slices shown (2 of 2)]
[im 1/1]
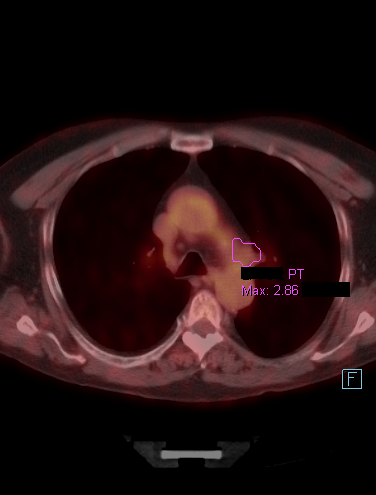

[25 of 25 positions shown; findings below may reference images not displayed]

FINDINGS: Mediastinal blood pool activity: SUV max  3

Liver activity: SUV max NA

NECK: Abnormal hypermetabolism identified in the left and posterior
laryngeal region with SUV max = 21.9 consistent with the patient's
known neoplasm. Hypermetabolic metastatic lymphadenopathy is
identified in the neck bilaterally at level II and III positions.

Index left-sided 11 mm short axis level II node ([DATE]) demonstrates
SUV max = 13.7.

Index right-sided 11 mm short axis level III lymph node demonstrates
SUV max = 18.4.

Incidental CT findings: none

CHEST: No hypermetabolic mediastinal or hilar nodes. No suspicious
pulmonary nodules on the CT scan.

Incidental CT findings: Coronary artery calcification is evident.
Atherosclerotic calcification is noted in the wall of the thoracic
aorta.

ABDOMEN/PELVIS: No abnormal hypermetabolic activity within the
liver, pancreas, adrenal glands, or spleen. No hypermetabolic lymph
nodes in the abdomen or pelvis.

Incidental CT findings: There is abdominal aortic atherosclerosis
without aneurysm.

SKELETON: No focal hypermetabolic activity to suggest skeletal
metastasis.

Incidental CT findings: none
IMPRESSION: 1. Hypermetabolic laryngeal lesion consistent with the patient's
known primary malignancy.
2. Hypermetabolic cervical metastatic disease bilaterally.
3. No evidence for hypermetabolic metastases in the chest, abdomen,
or pelvis.

## 2021-12-15 DIAGNOSIS — M549 Dorsalgia, unspecified: Secondary | ICD-10-CM | POA: Diagnosis not present

## 2021-12-15 DIAGNOSIS — G894 Chronic pain syndrome: Secondary | ICD-10-CM | POA: Diagnosis not present

## 2021-12-15 DIAGNOSIS — M4306 Spondylolysis, lumbar region: Secondary | ICD-10-CM | POA: Diagnosis not present

## 2021-12-21 DIAGNOSIS — S72142D Displaced intertrochanteric fracture of left femur, subsequent encounter for closed fracture with routine healing: Secondary | ICD-10-CM | POA: Diagnosis not present

## 2021-12-24 ENCOUNTER — Other Ambulatory Visit: Payer: Self-pay | Admitting: Nurse Practitioner

## 2021-12-29 ENCOUNTER — Telehealth: Payer: Self-pay

## 2021-12-29 NOTE — Telephone Encounter (Signed)
Pt wife called concerning lack of appointment time for CT studies for patient. Rn verified that orders were in. Rn spoke with Enid Derry, who stated that the studies are not approved by insurance yet. Rn will call pt wife back and let her know.

## 2022-01-06 ENCOUNTER — Telehealth: Payer: Self-pay

## 2022-01-06 NOTE — Telephone Encounter (Signed)
Rn called pt wife back after voicemail was left by pt wife. She stated she was under the impression he was getting a ct of his head as well. Rn reassured her that it was just studies of his head and neck. Pt wife [name] did state that pt feels overall depressed and not eating well. She did state he is drinking ensures and they are filling him up (however they are also constipating him). She also stated he was drinking sprites as well to stay hydrated. Rn encouraged pt wife to continue to encourage him to eat as well. Rn also instructed pt wife to call 911 or get him to the hospital if she feels like he is dehydrated, weak, and lethargic as well. She stated she would. She stated she would call with concerns or questions.

## 2022-01-09 ENCOUNTER — Telehealth: Payer: Self-pay | Admitting: Adult Health

## 2022-01-09 NOTE — Telephone Encounter (Signed)
Pt wife is calling. Requesting a refill on medication valproic acid (DEPAKENE) 250 MG/5ML solution. Refill should be sent to  [id number]   Stated pt has a seizure this morning that lasted about a minute and he shaking and grip the chair arm and he was just shaking and couldn't talk.

## 2022-01-09 NOTE — Telephone Encounter (Addendum)
Called and informed wife of RN's message. Wife stated that she had already called the pharmacy and that they said they had the RX and asked did she want it filled. Wife stated yes and pharmacy informed her that they would get it delivered to them this afternoon.

## 2022-01-09 NOTE — Telephone Encounter (Signed)
Seizure activity noted.

## 2022-01-10 NOTE — Progress Notes (Signed)
[name] is being seen in clinic today following completion of radiation therapy for lung cancer. He completed treatment on 09-21-21.  PAIN: None to report  RESPIRATORY: Coughing  Dry. Pt is on room air. Coughs up a good amount of sputum generally. Noted  None.  SWALLOWING/DIET: Pt feels like food goods get stuck and has trouble getting pills down.   OTHER: Pt complains of fatigue, weakness, loss of sleep, and poor appetite.  There were no vitals taken for this visit.   Wt Readings from Last 3 Encounters:  10/28/21 155 lb 6 oz (70.5 kg)  09/07/21 163 lb 12.8 oz (74.3 kg)  09/02/21 162 lb 4 oz (73.6 kg)   Vitals:   01/17/22 1400  BP: (!) 165/80  Pulse: 60  Resp: 18  Temp: (!) 97.3 F (36.3 C)  SpO2: 94%   Wt Readings from Last 3 Encounters:  01/17/22 153 lb 4 oz (69.5 kg)  10/28/21 155 lb 6 oz (70.5 kg)  09/07/21 163 lb 12.8 oz (74.3 kg)   Could not afford Marinol it was too expensive. Wife reports a seizure at home. Pt with poor po intake with food but hydrates well. Takes in four ensures a day constipates him. He has fallen several times at home with dizzy spells. Has an appointment with primary on 02-07-22.

## 2022-01-13 ENCOUNTER — Ambulatory Visit (HOSPITAL_COMMUNITY)
Admission: RE | Admit: 2022-01-13 | Discharge: 2022-01-13 | Disposition: A | Discharge status: Home / Self Care | Payer: Medicare Other | Source: Ambulatory Visit | Attending: Radiation Oncology | Admitting: Radiation Oncology

## 2022-01-13 DIAGNOSIS — C760 Malignant neoplasm of head, face and neck: Secondary | ICD-10-CM | POA: Insufficient documentation

## 2022-01-13 DIAGNOSIS — K110 Atrophy of salivary gland: Secondary | ICD-10-CM | POA: Diagnosis not present

## 2022-01-13 DIAGNOSIS — R918 Other nonspecific abnormal finding of lung field: Secondary | ICD-10-CM | POA: Diagnosis not present

## 2022-01-13 DIAGNOSIS — J439 Emphysema, unspecified: Secondary | ICD-10-CM | POA: Diagnosis not present

## 2022-01-13 DIAGNOSIS — C321 Malignant neoplasm of supraglottis: Secondary | ICD-10-CM | POA: Diagnosis not present

## 2022-01-13 DIAGNOSIS — I6523 Occlusion and stenosis of bilateral carotid arteries: Secondary | ICD-10-CM | POA: Diagnosis not present

## 2022-01-13 DIAGNOSIS — J90 Pleural effusion, not elsewhere classified: Secondary | ICD-10-CM | POA: Diagnosis not present

## 2022-01-13 MED ORDER — IOHEXOL 300 MG/ML  SOLN
75.0000 mL | Freq: Once | INTRAMUSCULAR | Status: AC | PRN
  Administered 2022-01-13: 75 mL via INTRAVENOUS

## 2022-01-13 MED ORDER — SODIUM CHLORIDE (PF) 0.9 % IJ SOLN
INTRAMUSCULAR | Status: AC
  Filled 2022-01-13: qty 50

## 2022-01-16 ENCOUNTER — Telehealth: Payer: Self-pay

## 2022-01-16 ENCOUNTER — Other Ambulatory Visit: Payer: Self-pay | Admitting: Adult Health

## 2022-01-16 DIAGNOSIS — I6523 Occlusion and stenosis of bilateral carotid arteries: Secondary | ICD-10-CM

## 2022-01-16 NOTE — Telephone Encounter (Signed)
[facility]iology called concerning pt Chest Ct results. Rn informed Dr. Basilio Cairo, who will call for results. The number is [phone].

## 2022-01-17 ENCOUNTER — Encounter: Payer: Self-pay | Admitting: Radiation Oncology

## 2022-01-17 ENCOUNTER — Ambulatory Visit
Admission: RE | Admit: 2022-01-17 | Discharge: 2022-01-17 | Disposition: A | Discharge status: Home / Self Care | Payer: Medicare Other | Source: Ambulatory Visit | Attending: Radiation Oncology | Admitting: Radiation Oncology

## 2022-01-17 VITALS — BP 165/80 | HR 60 | Temp 97.3°F | Resp 18 | Ht 72.0 in | Wt 153.2 lb

## 2022-01-17 DIAGNOSIS — Z85818 Personal history of malignant neoplasm of other sites of lip, oral cavity, and pharynx: Secondary | ICD-10-CM | POA: Insufficient documentation

## 2022-01-17 DIAGNOSIS — J432 Centrilobular emphysema: Secondary | ICD-10-CM | POA: Diagnosis not present

## 2022-01-17 DIAGNOSIS — Z79890 Hormone replacement therapy (postmenopausal): Secondary | ICD-10-CM | POA: Diagnosis not present

## 2022-01-17 DIAGNOSIS — I2510 Atherosclerotic heart disease of native coronary artery without angina pectoris: Secondary | ICD-10-CM | POA: Diagnosis not present

## 2022-01-17 DIAGNOSIS — K110 Atrophy of salivary gland: Secondary | ICD-10-CM | POA: Diagnosis not present

## 2022-01-17 DIAGNOSIS — C7802 Secondary malignant neoplasm of left lung: Secondary | ICD-10-CM | POA: Diagnosis not present

## 2022-01-17 DIAGNOSIS — C3411 Malignant neoplasm of upper lobe, right bronchus or lung: Secondary | ICD-10-CM | POA: Diagnosis not present

## 2022-01-17 DIAGNOSIS — J90 Pleural effusion, not elsewhere classified: Secondary | ICD-10-CM | POA: Insufficient documentation

## 2022-01-17 DIAGNOSIS — C3431 Malignant neoplasm of lower lobe, right bronchus or lung: Secondary | ICD-10-CM | POA: Insufficient documentation

## 2022-01-17 DIAGNOSIS — I700 Atherosclerosis of aorta: Secondary | ICD-10-CM | POA: Diagnosis not present

## 2022-01-17 DIAGNOSIS — Z79899 Other long term (current) drug therapy: Secondary | ICD-10-CM | POA: Insufficient documentation

## 2022-01-17 DIAGNOSIS — C321 Malignant neoplasm of supraglottis: Secondary | ICD-10-CM | POA: Diagnosis not present

## 2022-01-17 NOTE — Progress Notes (Signed)
Radiation Oncology         [phone] ________________________________  Name: ELAZAR ARGABRIGHT MRN: [id number]  Date: 01/17/2022  DOB: 07/19/1960  Follow-Up Visit Note  Outpatient  CC: Elfredia Nevins, MD  Elfredia Nevins, MD  Diagnosis and Prior Radiotherapy: C34.31   ICD-10-CM   1. Malignant neoplasm of upper lobe of right lung (HCC)  C34.11     2. Malignant neoplasm of lower lobe of right lung (HCC)  C34.31        Cancer Staging  Malignant neoplasm of supraglottis (HCC) Staging form: Larynx - Supraglottis, AJCC 8th Edition - Clinical: Stage IVA (cT2, cN2c, cM0) - Signed by Doreatha Massed, MD on 01/30/2019     Radiation Treatment Dates: 09/16/2021 through 09/21/2021 Site Technique Total Dose (Gy) Dose per Fx (Gy) Completed Fx Beam Energies  Lung, Left: Lung_L_LL_retx IMRT 54/54 18 3/3 6XFFF    Radiation Treatment Dates: 08/10/2020 through 08/20/2020 Site Technique Total Dose (Gy) Dose per Fx (Gy) Completed Fx Beam Energies  Lung, Right: Lung_Rt_upper IMRT 50/50 10 5/5 6XFFF  Lung, Left: Lung_Lt_lower IMRT 50/50 10 5/5 6XFFF   Radiation Treatment Dates: 03/05/2019 through 05/08/2019 Site Technique Total Dose (Gy) Dose per Fx (Gy) Completed Fx Beam Energies  Larynx: HN_larynx IMRT 70/70 2 35/35 6X    CHIEF COMPLAINT: Here for follow-up after lung treatment to review recent CT results  Narrative:   Patient had a CT of the neck and chest on 01/13/22. CT of the neck showed satisfactory post treatment appearance of the neck. CT of the neck/chest w/ cont unfortunately showed enlargement of right lower lobe nodule, highly concerning for neoplasm.  Otherwise no new sites of neoplastic disease - I reviewed his images personally.  The patient returns today for routine follow-up.  He is with his wife.  He is present today in a wheelchair. He typically ambulates with walker or cane, but uses a wheelchair for long distances. His breathing has remained at baseline and he endorses a  productive cough. He denies chest pain or hemoptysis. His appetite has decreased since his radiation to his throat. He continues to have chronic issues with dizziness upon standing. He fell a couple 4 months ago and fractured his hip for which he had to have surgery for.   ALLERGIES:  is allergic to cymbalta [duloxetine hcl], duloxetine, and prednisone.  Meds: Current Outpatient Medications  Medication Sig Dispense Refill   acetaminophen (TYLENOL) 325 MG tablet Take 1.5 tablets (487.5 mg total) by mouth every 6 (six) hours as needed for mild pain, fever or headache (or Fever >/= 101).     albuterol (VENTOLIN HFA) 108 (90 Base) MCG/ACT inhaler Inhale 1-2 puffs into the lungs every 4 (four) hours as needed for wheezing or shortness of breath.     atorvastatin (LIPITOR) 40 MG tablet Take 40 mg by mouth daily.     Baclofen 5 MG TABS Take 10 mg by mouth at bedtime. (Patient taking differently: Take 5 mg by mouth at bedtime.) 60 tablet 3   diazepam (VALIUM) 5 MG tablet Take 1 tablet (5 mg total) by mouth daily as needed for anxiety. 30 tablet 0   DULoxetine (CYMBALTA) 30 MG capsule Take 30 mg by mouth daily.     HYDROcodone-acetaminophen (NORCO) 10-325 MG tablet Take 1 tablet by mouth every 4 (four) hours as needed. 50 tablet 0   levothyroxine (SYNTHROID) 50 MCG tablet Take 50 mcg by mouth daily.     metoprolol tartrate (LOPRESSOR) 25 MG tablet Take 12.5 mg  by mouth 2 (two) times daily.     nitroGLYCERIN (NITROSTAT) 0.4 MG SL tablet Place 1 tablet (0.4 mg total) under the tongue every 5 (five) minutes as needed for chest pain (CP or SOB). 25 tablet 3   omeprazole (PRILOSEC) 40 MG capsule Take 1 tablet by mouth once daily. MUST HAVE OFFICE VISIT FOR FURTHER REFILLS 90 capsule 0   ondansetron (ZOFRAN) 4 MG tablet Take 1 tablet (4 mg total) by mouth every 6 (six) hours as needed for nausea. 20 tablet 0   polyethylene glycol (MIRALAX / GLYCOLAX) 17 g packet Take 17 g by mouth daily. 14 each 0   sennosides  (SENOKOT) 8.8 MG/5ML syrup Take 5 mLs by mouth at bedtime. 240 mL 0   valproic acid (DEPAKENE) 250 MG/5ML solution TAKE 10 ML BY MOUTH THREE TIMES DAILY 900 mL 5   bisacodyl (DULCOLAX) 10 MG suppository Place 1 suppository (10 mg total) rectally daily as needed for moderate constipation. (Patient not taking: Reported on 01/17/2022) 12 suppository 0   docusate sodium (COLACE) 100 MG capsule Take 100 mg by mouth 2 (two) times daily. (Patient not taking: Reported on 01/17/2022)     dronabinol (MARINOL) 5 MG capsule Take 1 capsule (5 mg total) by mouth 2 (two) times daily before a meal. (Patient not taking: Reported on 01/17/2022) 60 capsule 2   No current facility-administered medications for this encounter.    Physical Findings: The patient is in no acute distress. Patient is alert and oriented.  height is 6' (1.829 m) and weight is 153 lb 4 oz (69.5 kg). His temporal temperature is 97.3 F (36.3 C) (abnormal). His blood pressure is 165/80 (abnormal) and his pulse is 60. His respiration is 18 and oxygen saturation is 94%. Marland Kitchen    Heart is regular in rate and rhythm, with no murmurs, clicks, or gallops. Lungs are clear to auscultation bilaterally Skin is notable for darkening and dryness over the left posterior chest consistent with radiation changes No cervical, supraclavicular, or axiliary lymphadenopathy palpated.  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 Restaging. EXAM: CT NECK WITH CONTRAST TECHNIQUE: Multidetector CT imaging of the neck was performed using the standard protocol following the bolus administration of intravenous  contrast. RADIATION DOSE REDUCTION: This exam was performed according to the departmental dose-optimization program which includes automated exposure control, adjustment of the mA and/or kV according to patient size and/or use of iterative reconstruction technique. CONTRAST:  28mL OMNIPAQUE IOHEXOL 300 MG/ML  SOLN COMPARISON:  Chest CT the same day reported separately. Neck CT 08/03/2021 and earlier. FINDINGS: Pharynx and larynx: Stable laryngeal and pharyngeal soft tissue contours, with a post XRT featureless appearance again noted. Parapharyngeal and retropharyngeal spaces remain stable. Salivary glands: Mild motion artifact. Negative sublingual space. Bilateral submandibular gland atrophy. Parotid glands appear stable and more normal. Thyroid: Diminutive, negative. Lymph nodes: Negative. Diminutive bilateral cervical lymph nodes have not significantly changed from 1 year ago. Vascular: Bulky bilateral cervical carotid atherosclerosis, including evidence of high-grade bilateral ICA stenosis. But the major vascular structures in the neck and at the skull base remain patent. Limited intracranial: Bulky  Calcified atherosclerosis at the skull base. Grossly negative visible brain parenchyma. Visualized orbits: Negative. Mastoids and visualized paranasal sinuses: Visualized paranasal sinuses and mastoids are stable and well aerated. Skeleton: Mandible motion artifact. Absent dentition. No acute or suspicious osseous lesion identified in the neck. Upper chest: Chest CT is reported separately. IMPRESSION: 1. Satisfactory post treatment appearance of the Neck. NI-RADS category 1. 2. Chronic bulky and severe carotid atherosclerosis, with suspected High-grade bilateral ICA stenosis. 3. Chest CT the same day reported separately. Electronically Signed   By: Genevie Ann M.D.   On: 01/15/2022 11:36   CT Chest W Contrast  Result Date: 01/14/2022 CLINICAL DATA:  62 year old male with history of head and neck cancer. Cough, weight  loss. * Tracking Code: BO * EXAM: CT CHEST WITH CONTRAST TECHNIQUE: Multidetector CT imaging of the chest was performed during intravenous contrast administration. RADIATION DOSE REDUCTION: This exam was performed according to the departmental dose-optimization program which includes automated exposure control, adjustment of the mA and/or kV according to patient size and/or use of iterative reconstruction technique. CONTRAST:  35mL OMNIPAQUE IOHEXOL 300 MG/ML  SOLN COMPARISON:  PET-CT 05/11/2021.  Chest CT 05/03/2021. FINDINGS: Cardiovascular: Heart size is normal. There is no significant pericardial fluid, thickening or pericardial calcification. There is aortic atherosclerosis, as well as atherosclerosis of the great vessels of the mediastinum and the coronary arteries, including calcified atherosclerotic plaque in the left main, left anterior descending, left circumflex and right coronary arteries. In addition, there appears to be moderate stenosis at the ostium of the left common carotid artery. Mediastinum/Nodes: No pathologically enlarged mediastinal or hilar lymph nodes. Esophagus is unremarkable in appearance. No axillary lymphadenopathy. Lungs/Pleura: Previously noted hypermetabolic left lower lobe pulmonary nodule is now completely obscured by consolidative changes, areas of interstitial prominence and regional architectural distortion in both the left lower lobe and adjacent regions of the posterior aspect of the left upper lobe, presumably evolving postradiation changes given the interval placement of a fiducial marker in the left lower lobe at the site of the previously noted nodule. Adjacent to the left lower lobe there is a small partially loculated left pleural effusion. Notably, in the right lower lobe at the site of the previously noted thick-walled cavitary nodule in the superior segment on CT examination 05/03/2021, there is now a macrolobulated pulmonary nodule measuring 1.8 x 1.4 x 1.7 cm,  which makes contact with the pleura posteriorly, concerning for primary bronchogenic neoplasm. New areas of interstitial prominence, architectural distortion and volume loss are noted in the right lower lobe, also likely to reflect evolving postradiation changes. Fiducial marker in the medial aspect of the right upper lobe with adjacent ground-glass attenuation in septal thickening, stable. Mild diffuse bronchial wall thickening with mild centrilobular and paraseptal emphysema. Upper Abdomen: Aortic atherosclerosis. Musculoskeletal: Multiple old healed right-sided rib fractures. There are no aggressive appearing lytic or blastic lesions noted in the visualized portions of the skeleton. IMPRESSION: 1. Marked enlargement of a macrolobulated nodule in the superior segment of the right lower lobe measuring 1.8 x 1.4 x 1.7 cm, highly concerning for primary bronchogenic neoplasm. 2. Evolving postradiation changes in the lungs obscuring previously noted left lower lobe pulmonary nodule. Small partially loculated pleural effusion in the lower left hemithorax adjacent to the treated nodule. 3. No definite mediastinal or hilar lymphadenopathy. 4. Aortic atherosclerosis, in addition to left main and three-vessel coronary artery disease. There is also likely moderate stenosis at the ostium of the left common carotid artery. Please note that although the  presence of coronary artery calcium documents the presence of coronary artery disease, the severity of this disease and any potential stenosis cannot be assessed on this non-gated CT examination. Assessment for potential risk factor modification, dietary therapy or pharmacologic therapy may be warranted, if clinically indicated. 5. Mild diffuse bronchial wall thickening with mild centrilobular and paraseptal emphysema. 6. Additional incidental findings, as above. These results will be called to the ordering clinician or representative by the Radiologist Assistant, and  communication documented in the PACS or [employer]. Aortic Atherosclerosis (ICD10-I70.0) and Emphysema (ICD10-J43.9). Electronically Signed   By: Trudie Reed M.D.   On: 01/14/2022 10:26    Impression/Plan: Malignant neoplasm of Lower lobe of right lung  Recent restaging CT is unfortunately concerning for neoplasm in the right lower lobe (this is concerning for a metastasis vs new primary) with no evidence of lymphadenopathy. Patient is a good candidate for 3-5 fractions SBRT to the concerning mass in the right lung.  PET scan and biopsy would delay treatment and be unlikely to change management.  He would like to defer PET and defer biopsy.    We discussed the risks, benefits, short, and long term effects of radiotherapy, as well as the curative intent, and the patient is interested in proceeding. We discussed the delivery and logistics of radiotherapy and anticipates a course of 3-5 fractions of radiotherapy to the highly suspicious right lower lobe nodule.  Patient has a good understanding of the treatment plan and is enthusiastic about beginning treatment. All questions were answered. We will schedule CT simulation and plan to begin radiation treatment soon after. Consent signed after discussion of risks and benefits of SBRT.  Injury to thoracic tissues is possible with ablative SBRT.  I believe he has a healing rib fracture adjacent to the RLL mass and repeat rib fracture is a distinct possibility after radiation.    CT showed typical post radiation changes in the previously treated left lower lobe pulmonary nodule as well as a satisfactory post treatment appearance of the neck.  On date of service, in total, I spent 30 minutes on this encounter. Patient was seen in person.  _____________________________________   Joyice Faster, PA    Lonie Peak, MD

## 2022-01-26 ENCOUNTER — Ambulatory Visit: Payer: Medicare Other | Admitting: Radiation Oncology

## 2022-01-27 ENCOUNTER — Other Ambulatory Visit: Payer: Self-pay

## 2022-01-27 ENCOUNTER — Ambulatory Visit
Admission: RE | Admit: 2022-01-27 | Discharge: 2022-01-27 | Disposition: A | Discharge status: Home / Self Care | Payer: Medicare Other | Source: Ambulatory Visit | Attending: Radiation Oncology | Admitting: Radiation Oncology

## 2022-01-27 DIAGNOSIS — C3411 Malignant neoplasm of upper lobe, right bronchus or lung: Secondary | ICD-10-CM | POA: Diagnosis not present

## 2022-01-27 DIAGNOSIS — C3431 Malignant neoplasm of lower lobe, right bronchus or lung: Secondary | ICD-10-CM | POA: Diagnosis not present

## 2022-01-27 DIAGNOSIS — Z87891 Personal history of nicotine dependence: Secondary | ICD-10-CM | POA: Diagnosis not present

## 2022-01-27 DIAGNOSIS — C3432 Malignant neoplasm of lower lobe, left bronchus or lung: Secondary | ICD-10-CM | POA: Insufficient documentation

## 2022-01-31 DIAGNOSIS — C3431 Malignant neoplasm of lower lobe, right bronchus or lung: Secondary | ICD-10-CM | POA: Diagnosis not present

## 2022-01-31 DIAGNOSIS — C3411 Malignant neoplasm of upper lobe, right bronchus or lung: Secondary | ICD-10-CM | POA: Diagnosis not present

## 2022-01-31 DIAGNOSIS — Z87891 Personal history of nicotine dependence: Secondary | ICD-10-CM | POA: Diagnosis not present

## 2022-01-31 DIAGNOSIS — C3432 Malignant neoplasm of lower lobe, left bronchus or lung: Secondary | ICD-10-CM | POA: Diagnosis not present

## 2022-02-07 DIAGNOSIS — G319 Degenerative disease of nervous system, unspecified: Secondary | ICD-10-CM | POA: Diagnosis not present

## 2022-02-07 DIAGNOSIS — D518 Other vitamin B12 deficiency anemias: Secondary | ICD-10-CM | POA: Diagnosis not present

## 2022-02-07 DIAGNOSIS — J441 Chronic obstructive pulmonary disease with (acute) exacerbation: Secondary | ICD-10-CM | POA: Diagnosis not present

## 2022-02-07 DIAGNOSIS — E039 Hypothyroidism, unspecified: Secondary | ICD-10-CM | POA: Diagnosis not present

## 2022-02-07 DIAGNOSIS — C3411 Malignant neoplasm of upper lobe, right bronchus or lung: Secondary | ICD-10-CM | POA: Diagnosis not present

## 2022-02-07 DIAGNOSIS — I10 Essential (primary) hypertension: Secondary | ICD-10-CM | POA: Diagnosis not present

## 2022-02-07 DIAGNOSIS — E782 Mixed hyperlipidemia: Secondary | ICD-10-CM | POA: Diagnosis not present

## 2022-02-07 DIAGNOSIS — E559 Vitamin D deficiency, unspecified: Secondary | ICD-10-CM | POA: Diagnosis not present

## 2022-02-07 DIAGNOSIS — G894 Chronic pain syndrome: Secondary | ICD-10-CM | POA: Diagnosis not present

## 2022-02-07 DIAGNOSIS — E440 Moderate protein-calorie malnutrition: Secondary | ICD-10-CM | POA: Diagnosis not present

## 2022-02-07 DIAGNOSIS — Z6820 Body mass index (BMI) 20.0-20.9, adult: Secondary | ICD-10-CM | POA: Diagnosis not present

## 2022-02-07 DIAGNOSIS — Z0001 Encounter for general adult medical examination with abnormal findings: Secondary | ICD-10-CM | POA: Diagnosis not present

## 2022-02-08 ENCOUNTER — Ambulatory Visit
Admission: RE | Admit: 2022-02-08 | Discharge: 2022-02-08 | Disposition: A | Discharge status: Home / Self Care | Payer: Medicare Other | Source: Ambulatory Visit | Attending: Radiation Oncology | Admitting: Radiation Oncology

## 2022-02-08 ENCOUNTER — Other Ambulatory Visit: Payer: Self-pay

## 2022-02-08 DIAGNOSIS — C3432 Malignant neoplasm of lower lobe, left bronchus or lung: Secondary | ICD-10-CM | POA: Diagnosis not present

## 2022-02-08 DIAGNOSIS — C3411 Malignant neoplasm of upper lobe, right bronchus or lung: Secondary | ICD-10-CM | POA: Insufficient documentation

## 2022-02-08 LAB — RAD ONC ARIA SESSION SUMMARY
Course Elapsed Days: 0
Plan Fractions Treated to Date: 1
Plan Prescribed Dose Per Fraction: 12 Gy
Plan Total Fractions Prescribed: 5
Plan Total Prescribed Dose: 60 Gy
Reference Point Dosage Given to Date: 12 Gy
Reference Point Session Dosage Given: 12 Gy
Session Number: 1

## 2022-02-10 ENCOUNTER — Ambulatory Visit
Admission: RE | Admit: 2022-02-10 | Discharge: 2022-02-10 | Disposition: A | Discharge status: Home / Self Care | Payer: Medicare Other | Source: Ambulatory Visit | Attending: Radiation Oncology | Admitting: Radiation Oncology

## 2022-02-10 ENCOUNTER — Other Ambulatory Visit: Payer: Self-pay

## 2022-02-10 DIAGNOSIS — C3411 Malignant neoplasm of upper lobe, right bronchus or lung: Secondary | ICD-10-CM | POA: Diagnosis not present

## 2022-02-10 DIAGNOSIS — C3432 Malignant neoplasm of lower lobe, left bronchus or lung: Secondary | ICD-10-CM | POA: Diagnosis not present

## 2022-02-10 LAB — RAD ONC ARIA SESSION SUMMARY
Course Elapsed Days: 2
Plan Fractions Treated to Date: 2
Plan Prescribed Dose Per Fraction: 12 Gy
Plan Total Fractions Prescribed: 5
Plan Total Prescribed Dose: 60 Gy
Reference Point Dosage Given to Date: 24 Gy
Reference Point Session Dosage Given: 12 Gy
Session Number: 2

## 2022-02-13 ENCOUNTER — Other Ambulatory Visit: Payer: Self-pay

## 2022-02-13 ENCOUNTER — Ambulatory Visit
Admission: RE | Admit: 2022-02-13 | Discharge: 2022-02-13 | Disposition: A | Discharge status: Home / Self Care | Payer: Medicare Other | Source: Ambulatory Visit | Attending: Radiation Oncology | Admitting: Radiation Oncology

## 2022-02-13 DIAGNOSIS — C3411 Malignant neoplasm of upper lobe, right bronchus or lung: Secondary | ICD-10-CM | POA: Diagnosis not present

## 2022-02-13 DIAGNOSIS — C3432 Malignant neoplasm of lower lobe, left bronchus or lung: Secondary | ICD-10-CM | POA: Diagnosis not present

## 2022-02-13 LAB — RAD ONC ARIA SESSION SUMMARY
Course Elapsed Days: 5
Plan Fractions Treated to Date: 3
Plan Prescribed Dose Per Fraction: 12 Gy
Plan Total Fractions Prescribed: 5
Plan Total Prescribed Dose: 60 Gy
Reference Point Dosage Given to Date: 36 Gy
Reference Point Session Dosage Given: 12 Gy
Session Number: 3

## 2022-02-14 ENCOUNTER — Other Ambulatory Visit: Payer: Self-pay

## 2022-02-14 DIAGNOSIS — C3411 Malignant neoplasm of upper lobe, right bronchus or lung: Secondary | ICD-10-CM

## 2022-02-14 DIAGNOSIS — R918 Other nonspecific abnormal finding of lung field: Secondary | ICD-10-CM

## 2022-02-15 ENCOUNTER — Other Ambulatory Visit: Payer: Self-pay

## 2022-02-15 ENCOUNTER — Ambulatory Visit
Admission: RE | Admit: 2022-02-15 | Discharge: 2022-02-15 | Disposition: A | Discharge status: Home / Self Care | Payer: Medicare Other | Source: Ambulatory Visit | Attending: Radiation Oncology | Admitting: Radiation Oncology

## 2022-02-15 DIAGNOSIS — C3411 Malignant neoplasm of upper lobe, right bronchus or lung: Secondary | ICD-10-CM | POA: Diagnosis not present

## 2022-02-15 DIAGNOSIS — C3432 Malignant neoplasm of lower lobe, left bronchus or lung: Secondary | ICD-10-CM | POA: Diagnosis not present

## 2022-02-15 LAB — RAD ONC ARIA SESSION SUMMARY
Course Elapsed Days: 7
Plan Fractions Treated to Date: 4
Plan Prescribed Dose Per Fraction: 12 Gy
Plan Total Fractions Prescribed: 5
Plan Total Prescribed Dose: 60 Gy
Reference Point Dosage Given to Date: 48 Gy
Reference Point Session Dosage Given: 12 Gy
Session Number: 4

## 2022-02-17 ENCOUNTER — Other Ambulatory Visit: Payer: Self-pay

## 2022-02-17 ENCOUNTER — Ambulatory Visit
Admission: RE | Admit: 2022-02-17 | Discharge: 2022-02-17 | Disposition: A | Discharge status: Home / Self Care | Payer: Medicare Other | Source: Ambulatory Visit | Attending: Radiation Oncology | Admitting: Radiation Oncology

## 2022-02-17 DIAGNOSIS — Z87891 Personal history of nicotine dependence: Secondary | ICD-10-CM | POA: Diagnosis not present

## 2022-02-17 DIAGNOSIS — C3411 Malignant neoplasm of upper lobe, right bronchus or lung: Secondary | ICD-10-CM | POA: Diagnosis not present

## 2022-02-17 DIAGNOSIS — C3431 Malignant neoplasm of lower lobe, right bronchus or lung: Secondary | ICD-10-CM | POA: Diagnosis not present

## 2022-02-17 DIAGNOSIS — Z510 Encounter for antineoplastic radiation therapy: Secondary | ICD-10-CM | POA: Diagnosis not present

## 2022-02-17 DIAGNOSIS — C3432 Malignant neoplasm of lower lobe, left bronchus or lung: Secondary | ICD-10-CM | POA: Diagnosis not present

## 2022-02-17 LAB — RAD ONC ARIA SESSION SUMMARY
Course Elapsed Days: 9
Plan Fractions Treated to Date: 5
Plan Prescribed Dose Per Fraction: 12 Gy
Plan Total Fractions Prescribed: 5
Plan Total Prescribed Dose: 60 Gy
Reference Point Dosage Given to Date: 60 Gy
Reference Point Session Dosage Given: 12 Gy
Session Number: 5

## 2022-02-18 NOTE — Radiation Completion Notes (Signed)
Patient Name: [name], [name] MRN: [id number] Date of Birth: [birth year] Referring Physician: [name], M.D. Date of Service: 2022-02-18 Radiation Oncologist: Eppie Gibson, M.D. [facility] END OF TREATMENT NOTE     Diagnosis: C34.31 Malignant neoplasm of lower lobe, right bronchus or lung Staging on 2019-01-30: Malignant neoplasm of supraglottis (HCC) T=cT2, N=cN2c, M=cM0 Intent: Curative     ==========DELIVERED PLANS==========  First Treatment Date: 2022-02-08 - Last Treatment Date: 2022-02-17   Plan Name: Lung_RLL_SBRT Site: Lung, Right Technique: SBRT/SRT-IMRT Mode: Photon Dose Per Fraction: 12 Gy Prescribed Dose (Delivered / Prescribed): 60 Gy / 60 Gy Prescribed Fxs (Delivered / Prescribed): 5 / 5     ==========ON TREATMENT VISIT DATES========== 2022-02-08, 2022-02-08, 2022-02-10, 2022-02-13, 2022-02-13, 2022-02-15, 2022-02-17     ==========UPCOMING VISITS==========       ==========APPENDIX - ON TREATMENT VISIT NOTES==========   See weekly On Treatment Notes is Epic for details.

## 2022-03-07 DIAGNOSIS — E440 Moderate protein-calorie malnutrition: Secondary | ICD-10-CM | POA: Diagnosis not present

## 2022-03-07 DIAGNOSIS — C3411 Malignant neoplasm of upper lobe, right bronchus or lung: Secondary | ICD-10-CM | POA: Diagnosis not present

## 2022-03-07 DIAGNOSIS — G319 Degenerative disease of nervous system, unspecified: Secondary | ICD-10-CM | POA: Diagnosis not present

## 2022-03-07 DIAGNOSIS — C7802 Secondary malignant neoplasm of left lung: Secondary | ICD-10-CM | POA: Diagnosis not present

## 2022-03-14 ENCOUNTER — Telehealth: Payer: Self-pay

## 2022-03-14 NOTE — Telephone Encounter (Signed)
Received VM from patient's wife requesting return call regarding patient's upcoming appointments   Returned [name]'s call and informed her that the appointment for next week will be a telephone F/U with Dr. Pearlie Oyster nurse to see how [name] is fairing since completing most recent course of radiation. She also asked when he would have imaging done, and I informed her that [name] will have surveillance scans done towards the end of May before his next F/U with Dr. Isidore Moos in early June. [name] verbalized understanding/appreciation of call, and denied any other needs at this time,

## 2022-03-22 DIAGNOSIS — G894 Chronic pain syndrome: Secondary | ICD-10-CM | POA: Diagnosis not present

## 2022-03-22 DIAGNOSIS — J441 Chronic obstructive pulmonary disease with (acute) exacerbation: Secondary | ICD-10-CM | POA: Diagnosis not present

## 2022-03-22 DIAGNOSIS — J0100 Acute maxillary sinusitis, unspecified: Secondary | ICD-10-CM | POA: Diagnosis not present

## 2022-03-23 ENCOUNTER — Ambulatory Visit
Admission: RE | Admit: 2022-03-23 | Discharge: 2022-03-23 | Disposition: A | Discharge status: Home / Self Care | Payer: Medicare Other | Source: Ambulatory Visit | Attending: Radiation Oncology | Admitting: Radiation Oncology

## 2022-03-23 ENCOUNTER — Encounter: Payer: Self-pay | Admitting: Radiation Oncology

## 2022-03-23 ENCOUNTER — Telehealth: Payer: Self-pay

## 2022-03-23 NOTE — Telephone Encounter (Signed)
[name] was called for follow up for completion of radiation treatment for lung cancer.   PAIN: [name] c/o mild to moderate left rib pain. He has had this left rib pain for about 6 months (after a fall at home).   RESPIRATORY: Shortness of Breath  At Rest and Walking and Coughing  Dry. Pt is on room air. Pt reports shortness of breath and cough but feels this is related more to a sinus issue he is currently having (he just started an antibiotic for this).   SKIN: Pt reports no skin issues at this time.   SWALLOWING/DIET: Pt has had dysphagia for both solids and liquids Pt reports still having trouble swallowing since radiation treatment finished. He states he feels like there is a delay or slow progression with swallowing at times.  At one one point he reports the swallowing improved after interventions . However he states the swallowing is getting worse again. He does report his diet is greatly improving and he is gaining weight now. He also drinks 2-3 ensures a day. Rn will reach out to Dr. Isidore Moos about swallowing concerns and need for new speech evaluation.   OTHER: Pt complains of fatigue and weakness overall but feels it is is sinus issues at present. He states the sinus medications make him very tired and sleepy. Rn Anderson Malta informed pt and his wife of visit with Dr. Isidore Moos in June that will also include CT results as well.   Pt had no questions or concerns with this telephone follow up. Rn encouraged pt to rest and take care of himself with this current illness.    No vital signs are available as this was a telephone follow up.   Wt Readings from Last 3 Encounters:  01/17/22 153 lb 4 oz (69.5 kg)  10/28/21 155 lb 6 oz (70.5 kg)  09/07/21 163 lb 12.8 oz (74.3 kg)

## 2022-03-24 ENCOUNTER — Ambulatory Visit: Payer: Self-pay | Admitting: Radiation Oncology

## 2022-03-26 ENCOUNTER — Other Ambulatory Visit: Payer: Self-pay | Admitting: Nurse Practitioner

## 2022-03-27 ENCOUNTER — Other Ambulatory Visit: Payer: Self-pay

## 2022-03-27 ENCOUNTER — Other Ambulatory Visit: Payer: Self-pay | Admitting: Nurse Practitioner

## 2022-03-27 DIAGNOSIS — C3411 Malignant neoplasm of upper lobe, right bronchus or lung: Secondary | ICD-10-CM

## 2022-03-27 NOTE — Progress Notes (Signed)
Speech Therapy Evaluation order placed per in basket communication with speech therapist Dabney.

## 2022-03-28 ENCOUNTER — Other Ambulatory Visit: Payer: Self-pay

## 2022-03-28 ENCOUNTER — Telehealth: Payer: Self-pay

## 2022-03-28 DIAGNOSIS — C3411 Malignant neoplasm of upper lobe, right bronchus or lung: Secondary | ICD-10-CM

## 2022-03-28 NOTE — Telephone Encounter (Signed)
RN Anderson Malta called pt wife after she had questions about a call she had received yesterday. She thought someone from the GI office had called them but when she called the GI office they had not called her (about her husband). RN informed pt wife that speech therapy maybe who called them. Rn has reached out to speech therapy to see if it was them. On this call pt wife stated she had several concerns overall about his condition. She stated he sleeps a lot and does not eat well most of the time. She reports he eats about one meal a day. He drinks two bottles of sprite a day and ensures as well (between 2-3). She was concerned for his bowels with this call. Pt had been started on linzess for his bowels and she reports he has a hard time passing his stool. She states it is too large for him to pass. She reports he refuses to drink his miralax and suppositories are not helping him at this time. She stated this causes him to have nausea and often not want to eat as well. Rn encouraged pt to call his primary md to let them know overall what is going on with the pt. She stated she would call them and let them know. She seemed very worried about him overall. Rn encouraged pt wife to call 911 with pt falls or if she is concerned for his overall condition (lethargy, etc). Rn encouraged her to reach back out with any concerns. Rn will call pt wife back when hearing back from speech therapy.

## 2022-03-28 NOTE — Telephone Encounter (Signed)
RN called pt back after initial phone call this am to see if they would be open to seeing palliative care. Dr. Isidore Moos thought this program would provide additional support for the pt and his wife. His wife stated she would be open but he may not be. I told her I would place the referral and would speak to  him if I needed to. I explained what the program was for and to how to explain it to the pt. Pt wife feels like he does not like to accept help and is resistant sometimes. She stated she would work on getting him to understand the importance of the program. In this conversation pt wife also stated that speech therapy had already called them to set up an appointment. She was very grateful for calls and assistance today. She will reach out with further needs.

## 2022-04-03 ENCOUNTER — Other Ambulatory Visit: Payer: Self-pay

## 2022-04-03 DIAGNOSIS — C3411 Malignant neoplasm of upper lobe, right bronchus or lung: Secondary | ICD-10-CM

## 2022-04-03 NOTE — Progress Notes (Signed)
Palliative Care consult reordered.

## 2022-04-12 ENCOUNTER — Telehealth: Payer: Self-pay | Admitting: Nurse Practitioner

## 2022-04-12 NOTE — Progress Notes (Deleted)
Palliative Medicine [facility]  Telephone:([phone]) [phone] Fax:(336) [phone]   Name: [name] Date: 04/12/2022 MRN: [id number]  DOB: Aug 04, 1960  Patient Care Team: Elfredia Nevins, MD as PCP - General (Internal Medicine) Laqueta Linden, MD (Inactive) as PCP - Cardiology (Cardiology) Doreatha Massed, MD as Medical Oncologist (Oncology) Lonie Peak, MD as Attending Physician (Radiation Oncology) Barrie Folk, RN (Inactive) as Oncology Nurse Navigator Dorene Ar, CCC-SLP as Speech Language Pathologist (Speech Pathology) Bella Kennedy, PT as Physical Therapist (Physical Therapy) Sallee Lange, LCSW (Inactive) as Social Worker (General Practice) Malmfelt, Lise Auer, RN as Oncology Nurse Navigator (Oncology) Charlynne Pander, [name] (Inactive) as Consulting Physician (Dentistry) Therese Sarah, RN as Oncology Nurse Navigator (Oncology) Josephine Igo, DO as Consulting Physician (Pulmonary Disease)    REASON FOR CONSULTATION: [name] is a 62 y.o. male with oncologic medical history including CAD, prior CVA, hypertension, dyslipidemia, prior supraglottic squamous cell carcinoma, recurrent squamous cell lung cancer.  Palliative ask to see for symptom management and goals of care.    SOCIAL HISTORY:     reports that he quit smoking about 15 years ago. His smoking use included cigarettes. He started smoking about 44 years ago. He has a 96.00 pack-year smoking history. He quit smokeless tobacco use about 21 years ago.  His smokeless tobacco use included chew. He reports that he does not currently use alcohol after a past usage of about 12.0 standard drinks of alcohol per week. He reports that he does not currently use drugs after having used the following drugs: Marijuana.  ADVANCE DIRECTIVES:  None on file  CODE STATUS: Full code  PAST MEDICAL HISTORY: Past Medical History:  Diagnosis Date   Anemia    Anxiety     Arthritis    Cancer of larynx (HCC)    Carotid artery stenosis    Chronic lower back pain    Coronary artery disease    a. Diag cath 10/2012 for CP/abnormal nuc -> PCI s/p LAD atherectomy/DES placement 11/05/12.   Depression    Dilated aortic root (HCC)    GERD (gastroesophageal reflux disease)    History of hiatal hernia    Hypercholesteremia    Hypertension    Lung cancer (HCC)    Pneumonia    S/P percutaneous endoscopic gastrostomy (PEG) tube placement [facility])    Stroke (HCC) 07/31/2018   Pons    PAST SURGICAL HISTORY:  Past Surgical History:  Procedure Laterality Date   BIOPSY  11/24/2019   Procedure: BIOPSY;  Surgeon: Lemar Lofty., MD;  Location: [facility] ENDOSCOPY;  Service: Gastroenterology;;   BIOPSY  03/29/2020   Procedure: BIOPSY;  Surgeon: Lemar Lofty., MD;  Location: [facility] ENDOSCOPY;  Service: Gastroenterology;;   BRONCHIAL BIOPSY  07/26/2020   Procedure: BRONCHIAL BIOPSIES;  Surgeon: Josephine Igo, DO;  Location: [facility] ENDOSCOPY;  Service: Pulmonary;;   BRONCHIAL BRUSHINGS  07/26/2020   Procedure: BRONCHIAL BRUSHINGS;  Surgeon: Josephine Igo, DO;  Location: [facility] ENDOSCOPY;  Service: Pulmonary;;   BRONCHIAL NEEDLE ASPIRATION BIOPSY  07/26/2020   Procedure: BRONCHIAL NEEDLE ASPIRATION BIOPSIES;  Surgeon: Josephine Igo, DO;  Location: [facility] ENDOSCOPY;  Service: Pulmonary;;   CARDIAC CATHETERIZATION  10/29/2012   COLONOSCOPY WITH PROPOFOL N/A 11/24/2019   Procedure: COLONOSCOPY WITH PROPOFOL;  Surgeon: Lemar Lofty., MD;  Location: [facility] ENDOSCOPY;  Service: Gastroenterology;  Laterality: N/A;   CORONARY ANGIOPLASTY WITH STENT PLACEMENT  11/05/2012   DIRECT LARYNGOSCOPY N/A 01/02/2019  Procedure: MICRO DIRECT LARYNGOSCOPY BIOPSY OF LARYNGEAL MASS;  Surgeon: Newman Pies, MD;  Location: [facility] OR;  Service: ENT;  Laterality: N/A;   ESOPHAGOGASTRODUODENOSCOPY (EGD) WITH PROPOFOL N/A 08/11/2019   Procedure: ATTEMPTED ESOPHAGOGASTRODUODENOSCOPY (EGD) WITH PROPOFOL  (UNABLE TO PERFORM PERCUTANEOUS ENDOSCOPIC GASTROSTOMY TUBE REPLACEMENT);  Surgeon: Franky Macho, MD;  Location: [facility] ORS;  Service: Gastroenterology;  Laterality: N/A;   ESOPHAGOGASTRODUODENOSCOPY (EGD) WITH PROPOFOL N/A 11/24/2019   Procedure: ESOPHAGOGASTRODUODENOSCOPY (EGD) WITH PROPOFOL;  Surgeon: Meridee Score Netty Starring., MD;  Location: [facility] ENDOSCOPY;  Service: Gastroenterology;  Laterality: N/A;   ESOPHAGOGASTRODUODENOSCOPY (EGD) WITH PROPOFOL N/A 01/26/2020   Procedure: ESOPHAGOGASTRODUODENOSCOPY (EGD) WITH PROPOFOL;  Surgeon: Meridee Score Netty Starring., MD;  Location: [facility] ENDOSCOPY;  Service: Gastroenterology;  Laterality: N/A;   ESOPHAGOGASTRODUODENOSCOPY (EGD) WITH PROPOFOL N/A 03/29/2020   Procedure: ESOPHAGOGASTRODUODENOSCOPY (EGD) WITH PROPOFOL;  Surgeon: Meridee Score Netty Starring., MD;  Location: [facility] ENDOSCOPY;  Service: Gastroenterology;  Laterality: N/A;  NEEDS FLOURO   FIDUCIAL MARKER PLACEMENT  07/26/2020   Procedure: FIDUCIAL MARKER PLACEMENT;  Surgeon: Josephine Igo, DO;  Location: [facility] ENDOSCOPY;  Service: Pulmonary;;   HEMORRHOID SURGERY  ~ 2010   INSERTION OF MESH N/A 06/02/2015   Procedure: INSERTION OF MESH;  Surgeon: Franky Macho, MD;  Location: [facility] ORS;  Service: General;  Laterality: N/A;   INTRAMEDULLARY (IM) NAIL INTERTROCHANTERIC Left 09/08/2021   Procedure: INTRAMEDULLARY NAILING OF LEFT FEMUR;  Surgeon: Myrene Galas, MD;  Location: [facility] OR;  Service: Orthopedics;  Laterality: Left;   IR ANGIO INTRA EXTRACRAN SEL COM CAROTID INNOMINATE BILAT MOD SED  08/02/2018   IR ANGIO VERTEBRAL SEL VERTEBRAL BILAT MOD SED  08/02/2018   IR REPLACE G-TUBE SIMPLE WO FLUORO  08/12/2019   IR REPLC GASTRO/COLONIC TUBE PERCUT W/FLUORO  08/13/2019   LAPAROSCOPIC INSERTION GASTROSTOMY TUBE N/A 02/24/2019   Procedure: LAPAROSCOPIC INSERTION GASTROSTOMY TUBE;  Surgeon: Gaynelle Adu, MD;  Location: [facility] OR;  Service: General;  Laterality: N/A;   LEFT HEART CATHETERIZATION WITH CORONARY ANGIOGRAM N/A 10/30/2012    Procedure: LEFT HEART CATHETERIZATION WITH CORONARY ANGIOGRAM;  Surgeon: Iran Ouch, MD;  Location: [facility] CATH LAB;  Service: Cardiovascular;  Laterality: N/A;   MULTIPLE EXTRACTIONS WITH ALVEOLOPLASTY N/A 02/18/2019   Procedure: Extraction of tooth #'s 5-7, 10,11,14,20-29, 31 and 32 with alveoloplasty and maxiillary left buccal exostoses reductions;  Surgeon: Charlynne Pander, [name];  Location: [facility] OR;  Service: Oral Surgery;  Laterality: N/A;   PEG PLACEMENT N/A 01/26/2020   Procedure: PERCUTANEOUS ENDOSCOPIC GASTROSTOMY (PEG) REPLACEMENT;  Surgeon: Lemar Lofty., MD;  Location: [facility] ENDOSCOPY;  Service: Gastroenterology;  Laterality: N/A;   PERCUTANEOUS CORONARY ROTOBLATOR INTERVENTION (PCI-R) N/A 11/05/2012   Procedure: PERCUTANEOUS CORONARY ROTOBLATOR INTERVENTION (PCI-R);  Surgeon: Iran Ouch, MD;  Location: [facility] CATH LAB;  Service: Cardiovascular;  Laterality: N/A;   SAVORY DILATION N/A 11/24/2019   Procedure: SAVORY DILATION;  Surgeon: Meridee Score Netty Starring., MD;  Location: [facility] ENDOSCOPY;  Service: Gastroenterology;  Laterality: N/A;   SAVORY DILATION N/A 01/26/2020   Procedure: SAVORY DILATION;  Surgeon: Meridee Score Netty Starring., MD;  Location: [facility] ENDOSCOPY;  Service: Gastroenterology;  Laterality: N/A;   TRACHEOSTOMY TUBE PLACEMENT N/A 02/18/2019   Procedure: Tracheostomy;  Surgeon: Newman Pies, MD;  Location: [facility] OR;  Service: ENT;  Laterality: N/A;   UMBILICAL HERNIA REPAIR N/A 06/02/2015   Procedure: UMBILICAL HERNIORRHAPHY WITH MESH;  Surgeon: Franky Macho, MD;  Location: [facility] ORS;  Service: General;  Laterality: N/A;   VIDEO BRONCHOSCOPY WITH ENDOBRONCHIAL NAVIGATION Bilateral 07/26/2020   Procedure: VIDEO BRONCHOSCOPY WITH ENDOBRONCHIAL NAVIGATION;  Surgeon: Josephine IgoIcard, Bradley L, DO;  Location: [facility] ENDOSCOPY;  Service: Pulmonary;  Laterality: Bilateral;  ION   VIDEO BRONCHOSCOPY WITH RADIAL ENDOBRONCHIAL ULTRASOUND  07/26/2020   Procedure: VIDEO BRONCHOSCOPY WITH RADIAL ENDOBRONCHIAL  ULTRASOUND;  Surgeon: Josephine IgoIcard, Bradley L, DO;  Location: [facility] ENDOSCOPY;  Service: Pulmonary;;    HEMATOLOGY/ONCOLOGY HISTORY:  Oncology History  Malignant neoplasm of supraglottis  01/22/2019 Initial Diagnosis   Malignant neoplasm of supraglottis (HCC)   01/30/2019 Cancer Staging   Staging form: Larynx - Supraglottis, AJCC 8th Edition - Clinical: Stage IVA (cT2, cN2c, cM0) - Signed by Doreatha MassedKatragadda, Sreedhar, MD on 01/30/2019   03/07/2019 - 05/15/2019 Chemotherapy   Patient is on Treatment Plan : HEAD/NECK Cisplatin q7d       ALLERGIES:  is allergic to cymbalta [duloxetine hcl], duloxetine, and prednisone.  MEDICATIONS:  Current Outpatient Medications  Medication Sig Dispense Refill   acetaminophen (TYLENOL) 325 MG tablet Take 1.5 tablets (487.5 mg total) by mouth every 6 (six) hours as needed for mild pain, fever or headache (or Fever >/= 101).     albuterol (VENTOLIN HFA) 108 (90 Base) MCG/ACT inhaler Inhale 1-2 puffs into the lungs every 4 (four) hours as needed for wheezing or shortness of breath.     atorvastatin (LIPITOR) 40 MG tablet Take 40 mg by mouth daily.     Baclofen 5 MG TABS Take 10 mg by mouth at bedtime. (Patient taking differently: Take 5 mg by mouth at bedtime.) 60 tablet 3   bisacodyl (DULCOLAX) 10 MG suppository Place 1 suppository (10 mg total) rectally daily as needed for moderate constipation. (Patient not taking: Reported on 03/23/2022) 12 suppository 0   diazepam (VALIUM) 5 MG tablet Take 1 tablet (5 mg total) by mouth daily as needed for anxiety. 30 tablet 0   docusate sodium (COLACE) 100 MG capsule Take 100 mg by mouth 2 (two) times daily. (Patient not taking: Reported on 01/17/2022)     dronabinol (MARINOL) 5 MG capsule Take 1 capsule (5 mg total) by mouth 2 (two) times daily before a meal. (Patient not taking: Reported on 01/17/2022) 60 capsule 2   DULoxetine (CYMBALTA) 30 MG capsule Take 30 mg by mouth daily.     HYDROcodone-acetaminophen (NORCO) 10-325 MG tablet Take  1 tablet by mouth every 4 (four) hours as needed. 50 tablet 0   levothyroxine (SYNTHROID) 50 MCG tablet Take 50 mcg by mouth daily.     metoprolol tartrate (LOPRESSOR) 25 MG tablet Take 12.5 mg by mouth 2 (two) times daily.     nitroGLYCERIN (NITROSTAT) 0.4 MG SL tablet Place 1 tablet (0.4 mg total) under the tongue every 5 (five) minutes as needed for chest pain (CP or SOB). 25 tablet 3   omeprazole (PRILOSEC) 40 MG capsule TAKE 1 CAPSULE BY MOUTH EVERY DAY 30 capsule 0   ondansetron (ZOFRAN) 4 MG tablet Take 1 tablet (4 mg total) by mouth every 6 (six) hours as needed for nausea. 20 tablet 0   polyethylene glycol (MIRALAX / GLYCOLAX) 17 g packet Take 17 g by mouth daily. 14 each 0   sennosides (SENOKOT) 8.8 MG/5ML syrup Take 5 mLs by mouth at bedtime. 240 mL 0   valproic acid (DEPAKENE) 250 MG/5ML solution TAKE 10 ML BY MOUTH THREE TIMES DAILY 900 mL 5   No current facility-administered medications for this visit.    VITAL SIGNS: There were no vitals taken for this visit. There were no vitals filed for this visit.  Estimated body mass index is 20.78 kg/m  as calculated from the following:   Height as of 01/17/22: 6' (1.829 m).   Weight as of 01/17/22: 153 lb 4 oz (69.5 kg).  LABS: CBC:    Component Value Date/Time   WBC 8.5 09/11/2021 0030   HGB 8.9 (L) 09/11/2021 0030   HCT 26.1 (L) 09/11/2021 0030   HCT 25.0 (L) 04/02/2019 0437   PLT 155 09/11/2021 0030   MCV 89.1 09/11/2021 0030   NEUTROABS 5.5 08/03/2021 1037   LYMPHSABS 1.4 08/03/2021 1037   MONOABS 1.1 (H) 08/03/2021 1037   EOSABS 0.4 08/03/2021 1037   BASOSABS 0.0 08/03/2021 1037   Comprehensive Metabolic Panel:    Component Value Date/Time   NA 130 (L) 09/11/2021 0030   K 3.7 09/11/2021 0030   CL 93 (L) 09/11/2021 0030   CO2 29 09/11/2021 0030   BUN 6 09/11/2021 0030   BUN 13 12/24/2018 0918   CREATININE 0.67 09/11/2021 0030   CREATININE 1.23 10/28/2012 1558   GLUCOSE 106 (H) 09/11/2021 0030   CALCIUM 8.4 (L)  09/11/2021 0030   AST 16 08/03/2021 1037   ALT 10 08/03/2021 1037   ALKPHOS 68 08/03/2021 1037   BILITOT 0.5 08/03/2021 1037   PROT 7.8 08/03/2021 1037   ALBUMIN 3.8 08/03/2021 1037    RADIOGRAPHIC STUDIES: No results found.  PERFORMANCE STATUS (ECOG) : {CHL ONC ECOG OI:[id number]}  Review of Systems Unless otherwise noted, a complete review of systems is negative.  Physical Exam General: NAD Cardiovascular: regular rate and rhythm Pulmonary: clear ant fields Abdomen: soft, nontender, + bowel sounds Extremities: no edema, no joint deformities Skin: no rashes Neurological:  IMPRESSION: *** I introduced myself, Kensie Susman RN, and Palliative's role in collaboration with the oncology team. Concept of Palliative Care was introduced as specialized medical care for people and their families living with serious illness.  It focuses on providing relief from the symptoms and stress of a serious illness.  The goal is to improve quality of life for both the patient and the family. Values and goals of care important to patient and family were attempted to be elicited.    We discussed *** current illness and what it means in the larger context of *** on-going co-morbidities. Natural disease trajectory and expectations were discussed.  I discussed the importance of continued conversation with family and their medical providers regarding overall plan of care and treatment options, ensuring decisions are within the context of the patients values and GOCs.  PLAN: Established therapeutic relationship. Education provided on palliative's role in collaboration with their Oncology/Radiation team. I will plan to see patient back in 2-4 weeks in collaboration to other oncology appointments.    Patient expressed understanding and was in agreement with this plan. He also understands that He can call the clinic at any time with any questions, concerns, or complaints.   Thank you for your referral and  allowing Palliative to assist in Mr. Coleman Quaye Putt's care.   Number and complexity of problems addressed: ***HIGH - 1 or more chronic illnesses with SEVERE exacerbation, progression, or side effects of treatment - advanced cancer, pain. Any controlled substances utilized were prescribed in the context of palliative care.   Visit consisted of counseling and education dealing with the complex and emotionally intense issues of symptom management and palliative care in the setting of serious and potentially life-threatening illness.Greater than 50%  of this time was spent counseling and coordinating care related to the above assessment and plan.  Signed by: Willette Alma, AGPCNP-BC Palliative  Medicine Team/[facility]   *Please note that this is a verbal dictation therefore any spelling or grammatical errors are due to the "[employer] One" system interpretation.

## 2022-04-13 ENCOUNTER — Inpatient Hospital Stay: Payer: Medicare Other

## 2022-04-13 NOTE — Progress Notes (Deleted)
Palliative Medicine [facility]  [phone] [phone]   Name: [name] Date: 04/13/2022 MRN: [id number]  DOB: Dec 20, 1960  Patient Care Team: Elfredia NevinsFusco, Lawrence, MD as PCP - General (Internal Medicine) Laqueta LindenKoneswaran, Suresh A, MD (Inactive) as PCP - Cardiology (Cardiology) Doreatha MassedKatragadda, Sreedhar, MD as Medical Oncologist (Oncology) Lonie PeakSquire, Sarah, MD as Attending Physician (Radiation Oncology) Barrie Folkiehl, Richard A, RN (Inactive) as Oncology Nurse Navigator Dorene ArPorter, Dabney V, CCC-SLP as Speech Language Pathologist (Speech Pathology) Bella Kennedyussell, Cynthia J, PT as Physical Therapist (Physical Therapy) Sallee Langeunningham, Anne C, LCSW (Inactive) as Social Worker (General Practice) Malmfelt, Lise AuerJennifer L, RN as Oncology Nurse Navigator (Oncology) Charlynne PanderKulinski, Ronald F, [name] (Inactive) as Consulting Physician (Dentistry) Therese SarahEdwards, Morgan P, RN as Oncology Nurse Navigator (Oncology) Josephine IgoIcard, Bradley L, DO as Consulting Physician (Pulmonary Disease)    REASON FOR CONSULTATION: [name] is a 62 y.o. male with oncologic medical history including CAD, prior CVA, hypertension, dyslipidemia, prior supraglottic squamous cell carcinoma, recurrent squamous cell lung cancer.  Palliative ask to see for symptom management and goals of care.    SOCIAL HISTORY:     reports that he quit smoking about 15 years ago. His smoking use included cigarettes. He started smoking about 44 years ago. He has a 96.00 pack-year smoking history. He quit smokeless tobacco use about 21 years ago.  His smokeless tobacco use included chew. He reports that he does not currently use alcohol after a past usage of about 12.0 standard drinks of alcohol per week. He reports that he does not currently use drugs after having used the following drugs: Marijuana.  ADVANCE DIRECTIVES:  None on file  CODE STATUS: Full code  PAST MEDICAL HISTORY: Past Medical History:  Diagnosis Date   Anemia    Anxiety     Arthritis    Cancer of larynx (HCC)    Carotid artery stenosis    Chronic lower back pain    Coronary artery disease    a. Diag cath 10/2012 for CP/abnormal nuc -> PCI s/p LAD atherectomy/DES placement 11/05/12.   Depression    Dilated aortic root (HCC)    GERD (gastroesophageal reflux disease)    History of hiatal hernia    Hypercholesteremia    Hypertension    Lung cancer (HCC)    Pneumonia    S/P percutaneous endoscopic gastrostomy (PEG) tube placement [facility](HCC)    Stroke (HCC) 07/31/2018   Pons    PAST SURGICAL HISTORY:  Past Surgical History:  Procedure Laterality Date   BIOPSY  11/24/2019   Procedure: BIOPSY;  Surgeon: Lemar LoftyMansouraty, Gabriel Jr., MD;  [facility];  Service: Gastroenterology;;   BIOPSY  03/29/2020   Procedure: BIOPSY;  Surgeon: Lemar LoftyMansouraty, Gabriel Jr., MD;  Location: [facility] ENDOSCOPY;  Service: Gastroenterology;;   BRONCHIAL BIOPSY  07/26/2020   Procedure: BRONCHIAL BIOPSIES;  Surgeon: Josephine IgoIcard, Bradley L, DO;  Location: [facility] ENDOSCOPY;  Service: Pulmonary;;   BRONCHIAL BRUSHINGS  07/26/2020   Procedure: BRONCHIAL BRUSHINGS;  Surgeon: Josephine IgoIcard, Bradley L, DO;  Location: [facility] ENDOSCOPY;  Service: Pulmonary;;   BRONCHIAL NEEDLE ASPIRATION BIOPSY  07/26/2020   Procedure: BRONCHIAL NEEDLE ASPIRATION BIOPSIES;  Surgeon: Josephine IgoIcard, Bradley L, DO;  Location: [facility]OS[facility];  Service: Pulmonary;;   CARDIAC CATHETERIZATION  10/29/2012   COLONOSCOPY WITH PROPOFOL N/A 11/24/2019   Procedure: COLONOSCOPY WITH PROPOFOL;  Surgeon: Lemar LoftyMansouraty, Gabriel Jr., MD;  [facility];  Service: Gastroenterology;  Laterality: N/A;   CORONARY ANGIOPLASTY WITH STENT PLACEMENT  11/05/2012   DIRECT LARYNGOSCOPY N/A 01/02/2019  Procedure: MICRO DIRECT LARYNGOSCOPY BIOPSY OF LARYNGEAL MASS;  Surgeon: Newman Pies, MD;  Location: [facility] OR;  Service: ENT;  Laterality: N/A;   ESOPHAGOGASTRODUODENOSCOPY (EGD) WITH PROPOFOL N/A 08/11/2019   Procedure: ATTEMPTED ESOPHAGOGASTRODUODENOSCOPY (EGD) WITH PROPOFOL  (UNABLE TO PERFORM PERCUTANEOUS ENDOSCOPIC GASTROSTOMY TUBE REPLACEMENT);  Surgeon: Franky Macho, MD;  Location: [facility] ORS;  Service: Gastroenterology;  Laterality: N/A;   ESOPHAGOGASTRODUODENOSCOPY (EGD) WITH PROPOFOL N/A 11/24/2019   Procedure: ESOPHAGOGASTRODUODENOSCOPY (EGD) WITH PROPOFOL;  Surgeon: Meridee Score Netty Starring., MD;  Location[facility] ENDOSCOPY;  Service: Gastroenterology;  Laterality: N/A;   ESOPHAGOGASTRODUODENOSCOPY (EGD) WITH PROPOFOL N/A 01/26/2020   Procedure: ESOPHAGOGASTRODUODENOSCOPY (EGD) WITH PROPOFOL;  Surgeon: Meridee Score Netty Starring., MD;  Location: [facility] ENDOSCOPY;  Service: Gastroenterology;  Laterality: N/A;   ESOPHAGOGASTRODUODENOSCOPY (EGD) WITH PROPOFOL N/A 03/29/2020   Procedure: ESOPHAGOGASTRODUODENOSCOPY (EGD) WITH PROPOFOL;  Surgeon: Meridee Score Netty Starring., MD;  Location: [facility] ENDOSCOPY;  Service: Gastroenterology;  Laterality: N/A;  NEEDS FLOURO   FIDUCIAL MARKER PLACEMENT  07/26/2020   Procedure: FIDUCIAL MARKER PLACEMENT;  Surgeon: Josephine Igo, DO;  [facility]: [facility] ENDOS[facility];  Service: Pulmonary;;   HEMORRHOID SURGERY  ~ 2010   INSERTION OF MESH N/A 06/02/2015   Procedure: INSERTION OF MESH;  Surgeon: Franky Macho, MD;  Location: [facility] ORS;  Service: General;  Laterality: N/A;   INTRAMEDULLARY (IM) NAIL INTERTROCHANTERIC Left 09/08/2021   Procedure: INTRAMEDULLARY NAILING OF LEFT FEMUR;  Surgeon: Myrene Galas, MD;  Location: [facility] OR;  Service: Orthopedics;  Laterality: Left;   IR ANGIO INTRA EXTRACRAN SEL COM CAROTID INNOMINATE BILAT MOD SED  08/02/2018   IR ANGIO VERTEBRAL SEL VERTEBRAL BILAT MOD SED  08/02/2018   IR REPLACE G-TUBE SIMPLE WO FLUORO  08/12/2019   IR REPLC GASTRO/COLONIC TUBE PERCUT W/FLUORO  08/13/2019   LAPAROSCOPIC INSERTION GASTROSTOMY TUBE N/A 02/24/2019   Procedure: LAPAROSCOPIC INSERTION GASTROSTOMY TUBE;  Surgeon: Gaynelle Adu, MD;  Location[facility] OR;  Service: General;  Laterality: N/A;   LEFT HEART CATHETERIZATION WITH CORONARY ANGIOGRAM N/A 10/30/2012    Procedure: LEFT HEART CATHETERIZATION WITH CORONARY ANGIOGRAM;  Surgeon: Iran Ouch, MD;  [facility]: [facility] CATH LAB;  Service: Cardiovascular;  Laterality: N/A;   MULTIPLE EXTRACTIONS WITH ALVEOLOPLASTY N/A 02/18/2019   Procedure: Extraction of tooth #'s 5-7, 10,11,14,20-29, 31 and 32 with alveoloplasty and maxiillary left buccal exostoses reductions;  Surgeon: Charlynne Pander, [name];  Location: [facility] OR;  Service: Oral Surgery;  Laterality: N/A;   PEG PLACEMENT N/A 01/26/2020   Procedure: PERCUTANEOUS ENDOSCOPIC GASTROSTOMY (PEG) REPLACEMENT;  Surgeon: Lemar Lofty., MD;  Location[facility]COPY;  Service: Gastroenterology;  Laterality: N/A;   PERCUTANEOUS CORONARY ROTOBLATOR INTERVENTION (PCI-R) N/A 11/05/2012   Procedure: PERCUTANEOUS CORONARY ROTOBLATOR INTERVENTION (PCI-R);  Surgeon: Iran Ouch, MD;  Location: [facility] CATH LAB;  Service: Cardiovascular;  Laterality: N/A;   SAVORY DILATION N/A 11/24/2019   Procedure: SAVORY DILATION;  Surgeon: Meridee Score Netty Starring., MD;  Location[facility] ENDOSCOPY;  Service: Gastroenterology;  Laterality: N/A;   SAVORY DILATION N/A 01/26/2020   Procedure: SAVORY DILATION;  Surgeon: Meridee Score Netty Starring., MD;  Location: [facility] ENDOSCOPY;  Service: Gastroenterology;  Laterality: N/A;   TRACHEOSTOMY TUBE PLACEMENT N/A 02/18/2019   Procedure: Tracheostomy;  Surgeon: Newman Pies, MD;  [facility];  Service: ENT;  Laterality: N/A;   UMBILICAL HERNIA REPAIR N/A 06/02/2015   Procedure: UMBILICAL HERNIORRHAPHY WITH MESH;  Surgeon: Franky Macho, MD;  Location: [facility]S;  Service: General;  Laterality: N/A;   VIDEO BRONCHOSCOPY WITH ENDOBRONCHIAL NAVIGATION Bilateral 07/26/2020   Procedure: VIDEO BRONCHOSCOPY WITH ENDOBRONCHIAL NAVIGATION;  Surgeon: Josephine Igo, DO;  [facility]: [facility];  Service: Pulmonary;  Laterality: Bilateral;  ION   VIDEO BRONCHOSCOPY WITH RADIAL ENDOBRONCHIAL ULTRASOUND  07/26/2020   Procedure: VIDEO BRONCHOSCOPY WITH RADIAL ENDOBRONCHIAL  ULTRASOUND;  Surgeon: Josephine Igo, DO;  [facility]: [facility]OS[facility];  Service: Pulmonary;;    HEMATOLOGY/ONCOLOGY HISTORY:  Oncology History  Malignant neoplasm of supraglottis  01/22/2019 Initial Diagnosis   Malignant neoplasm of supraglottis (HCC)   01/30/2019 Cancer Staging   Staging form: Larynx - Supraglottis, AJCC 8th Edition - Clinical: Stage IVA (cT2, cN2c, cM0) - Signed by Doreatha Massed, MD on 01/30/2019   03/07/2019 - 05/15/2019 Chemotherapy   Patient is on Treatment Plan : HEAD/NECK Cisplatin q7d       ALLERGIES:  is allergic to cymbalta [duloxetine hcl], duloxetine, and prednisone.  MEDICATIONS:  Current Outpatient Medications  Medication Sig Dispense Refill   acetaminophen (TYLENOL) 325 MG tablet Take 1.5 tablets (487.5 mg total) by mouth every 6 (six) hours as needed for mild pain, fever or headache (or Fever >/= 101).     albuterol (VENTOLIN HFA) 108 (90 Base) MCG/ACT inhaler Inhale 1-2 puffs into the lungs every 4 (four) hours as needed for wheezing or shortness of breath.     atorvastatin (LIPITOR) 40 MG tablet Take 40 mg by mouth daily.     Baclofen 5 MG TABS Take 10 mg by mouth at bedtime. (Patient taking differently: Take 5 mg by mouth at bedtime.) 60 tablet 3   bisacodyl (DULCOLAX) 10 MG suppository Place 1 suppository (10 mg total) rectally daily as needed for moderate constipation. (Patient not taking: Reported on 03/23/2022) 12 suppository 0   diazepam (VALIUM) 5 MG tablet Take 1 tablet (5 mg total) by mouth daily as needed for anxiety. 30 tablet 0   docusate sodium (COLACE) 100 MG capsule Take 100 mg by mouth 2 (two) times daily. (Patient not taking: Reported on 01/17/2022)     dronabinol (MARINOL) 5 MG capsule Take 1 capsule (5 mg total) by mouth 2 (two) times daily before a meal. (Patient not taking: Reported on 01/17/2022) 60 capsule 2   DULoxetine (CYMBALTA) 30 MG capsule Take 30 mg by mouth daily.     HYDROcodone-acetaminophen (NORCO) 10-325 MG tablet Take  1 tablet by mouth every 4 (four) hours as needed. 50 tablet 0   levothyroxine (SYNTHROID) 50 MCG tablet Take 50 mcg by mouth daily.     metoprolol tartrate (LOPRESSOR) 25 MG tablet Take 12.5 mg by mouth 2 (two) times daily.     nitroGLYCERIN (NITROSTAT) 0.4 MG SL tablet Place 1 tablet (0.4 mg total) under the tongue every 5 (five) minutes as needed for chest pain (CP or SOB). 25 tablet 3   omeprazole (PRILOSEC) 40 MG capsule TAKE 1 CAPSULE BY MOUTH EVERY DAY 30 capsule 0   ondansetron (ZOFRAN) 4 MG tablet Take 1 tablet (4 mg total) by mouth every 6 (six) hours as needed for nausea. 20 tablet 0   polyethylene glycol (MIRALAX / GLYCOLAX) 17 g packet Take 17 g by mouth daily. 14 each 0   sennosides (SENOKOT) 8.8 MG/5ML syrup Take 5 mLs by mouth at bedtime. 240 mL 0   valproic acid (DEPAKENE) 250 MG/5ML solution TAKE 10 ML BY MOUTH THREE TIMES DAILY 900 mL 5   No current facility-administered medications for this visit.    VITAL SIGNS: There were no vitals taken for this visit. There were no vitals filed for this visit.  Estimated body mass index is 20.78 kg/m  as calculated from the following:   Height as of 01/17/22: 6' (1.829 m).   Weight as of 01/17/22: 153 lb 4 oz (69.5 kg).  LABS: CBC:    Component Value Date/Time   WBC 8.5 09/11/2021 0030   HGB 8.9 (L) 09/11/2021 0030   HCT 26.1 (L) 09/11/2021 0030   HCT 25.0 (L) 04/02/2019 0437   PLT 155 09/11/2021 0030   MCV 89.1 09/11/2021 0030   NEUTROABS 5.5 08/03/2021 1037   LYMPHSABS 1.4 08/03/2021 1037   MONOABS 1.1 (H) 08/03/2021 1037   EOSABS 0.4 08/03/2021 1037   BASOSABS 0.0 08/03/2021 1037   Comprehensive Metabolic Panel:    Component Value Date/Time   NA 130 (L) 09/11/2021 0030   K 3.7 09/11/2021 0030   CL 93 (L) 09/11/2021 0030   CO2 29 09/11/2021 0030   BUN 6 09/11/2021 0030   BUN 13 12/24/2018 0918   CREATININE 0.67 09/11/2021 0030   CREATININE 1.23 10/28/2012 1558   GLUCOSE 106 (H) 09/11/2021 0030   CALCIUM 8.4 (L)  09/11/2021 0030   AST 16 08/03/2021 1037   ALT 10 08/03/2021 1037   ALKPHOS 68 08/03/2021 1037   BILITOT 0.5 08/03/2021 1037   PROT 7.8 08/03/2021 1037   ALBUMIN 3.8 08/03/2021 1037    RADIOGRAPHIC STUDIES: No results found.  PERFORMANCE STATUS (ECOG) : {CHL ONC ECOG OI:[id number]}  Review of Systems Unless otherwise noted, a complete review of systems is negative.  Physical Exam General: NAD Cardiovascular: regular rate and rhythm Pulmonary: clear ant fields Abdomen: soft, nontender, + bowel sounds Extremities: no edema, no joint deformities Skin: no rashes Neurological:  IMPRESSION: *** I introduced myself, Mekaila Tarnow RN, and Palliative's role in collaboration with the oncology team. Concept of Palliative Care was introduced as specialized medical care for people and their families living with serious illness.  It focuses on providing relief from the symptoms and stress of a serious illness.  The goal is to improve quality of life for both the patient and the family. Values and goals of care important to patient and family were attempted to be elicited.    We discussed *** current illness and what it means in the larger context of *** on-going co-morbidities. Natural disease trajectory and expectations were discussed.  I discussed the importance of continued conversation with family and their medical providers regarding overall plan of care and treatment options, ensuring decisions are within the context of the patients values and GOCs.  PLAN: Established therapeutic relationship. Education provided on palliative's role in collaboration with their Oncology/Radiation team. I will plan to see patient back in 2-4 weeks in collaboration to other oncology appointments.    Patient expressed understanding and was in agreement with this plan. He also understands that He can call the clinic at any time with any questions, concerns, or complaints.   Thank you for your referral and  allowing Palliative to assist in [name]'s care.   Number and complexity of problems addressed: ***HIGH - 1 or more chronic illnesses with SEVERE exacerbation, progression, or side effects of treatment - advanced cancer, pain. Any controlled substances utilized were prescribed in the context of palliative care.   Visit consisted of counseling and education dealing with the complex and emotionally intense issues of symptom management and palliative care in the setting of serious and potentially life-threatening illness.Greater than 50%  of this time was spent counseling and coordinating care related to the above assessment and plan.  Signed by: Willette Alma, AGPCNP-BC Palliative  Medicine Team/[facility]   *Please note that this is a verbal dictation therefore any spelling or grammatical errors are due to the "[employer] One" system interpretation.

## 2022-04-18 DIAGNOSIS — R739 Hyperglycemia, unspecified: Secondary | ICD-10-CM | POA: Diagnosis not present

## 2022-04-18 DIAGNOSIS — I469 Cardiac arrest, cause unspecified: Secondary | ICD-10-CM | POA: Diagnosis not present

## 2022-04-18 DIAGNOSIS — Z743 Need for continuous supervision: Secondary | ICD-10-CM | POA: Diagnosis not present

## 2022-04-18 DIAGNOSIS — R404 Transient alteration of awareness: Secondary | ICD-10-CM | POA: Diagnosis not present

## 2022-04-18 DIAGNOSIS — I499 Cardiac arrhythmia, unspecified: Secondary | ICD-10-CM | POA: Diagnosis not present

## 2022-04-19 ENCOUNTER — Inpatient Hospital Stay: Payer: Medicare Other

## 2022-04-19 ENCOUNTER — Ambulatory Visit (HOSPITAL_COMMUNITY): Payer: Medicare Other | Admitting: Speech Pathology

## 2022-04-20 ENCOUNTER — Ambulatory Visit: Payer: Medicare Other | Admitting: Adult Health

## 2022-04-20 ENCOUNTER — Telehealth: Payer: Self-pay | Admitting: Adult Health

## 2022-04-20 NOTE — Telephone Encounter (Signed)
Noted, will send condolence card  

## 2022-04-20 NOTE — Telephone Encounter (Signed)
Pt's wife called stating that the pt has passed away.

## 2022-04-23 ENCOUNTER — Other Ambulatory Visit: Payer: Self-pay | Admitting: Nurse Practitioner

## 2022-04-28 ENCOUNTER — Telehealth: Payer: Self-pay | Admitting: Nurse Practitioner

## 2022-04-28 NOTE — Telephone Encounter (Signed)
Patients daughter called she called last week to cancel the appointment because patient passed away and the appointment was re -added.

## 2022-05-03 DEATH — deceased

## 2022-05-24 ENCOUNTER — Other Ambulatory Visit (HOSPITAL_COMMUNITY): Payer: Medicare Other

## 2022-06-06 ENCOUNTER — Ambulatory Visit: Payer: Self-pay | Admitting: Radiation Oncology

## 2022-08-29 ENCOUNTER — Ambulatory Visit: Payer: Medicare Other | Admitting: Adult Health
